# Patient Record
Sex: Male | Born: 1935 | Race: White | Hispanic: No | Marital: Married | Smoking: Never smoker
Health system: Southern US, Community
[De-identification: ages and names within clinical notes are randomized; demographics above are authoritative.]

## PROBLEM LIST (undated history)

## (undated) DIAGNOSIS — M51369 Other intervertebral disc degeneration, lumbar region without mention of lumbar back pain or lower extremity pain: Secondary | ICD-10-CM

## (undated) DIAGNOSIS — I10 Essential (primary) hypertension: Secondary | ICD-10-CM

## (undated) DIAGNOSIS — I82819 Embolism and thrombosis of superficial veins of unspecified lower extremities: Secondary | ICD-10-CM

## (undated) DIAGNOSIS — N183 Chronic kidney disease, stage 3 unspecified: Secondary | ICD-10-CM

## (undated) DIAGNOSIS — R911 Solitary pulmonary nodule: Secondary | ICD-10-CM

## (undated) DIAGNOSIS — E785 Hyperlipidemia, unspecified: Secondary | ICD-10-CM

## (undated) DIAGNOSIS — I701 Atherosclerosis of renal artery: Secondary | ICD-10-CM

## (undated) DIAGNOSIS — C61 Malignant neoplasm of prostate: Secondary | ICD-10-CM

## (undated) DIAGNOSIS — M1A00X0 Idiopathic chronic gout, unspecified site, without tophus (tophi): Secondary | ICD-10-CM

## (undated) DIAGNOSIS — T7840XA Allergy, unspecified, initial encounter: Secondary | ICD-10-CM

## (undated) DIAGNOSIS — Z9289 Personal history of other medical treatment: Secondary | ICD-10-CM

## (undated) DIAGNOSIS — M1A9XX0 Chronic gout, unspecified, without tophus (tophi): Secondary | ICD-10-CM

## (undated) DIAGNOSIS — M5136 Other intervertebral disc degeneration, lumbar region: Secondary | ICD-10-CM

## (undated) DIAGNOSIS — I350 Nonrheumatic aortic (valve) stenosis: Secondary | ICD-10-CM

## (undated) DIAGNOSIS — D649 Anemia, unspecified: Secondary | ICD-10-CM

## (undated) DIAGNOSIS — R001 Bradycardia, unspecified: Secondary | ICD-10-CM

## (undated) DIAGNOSIS — I219 Acute myocardial infarction, unspecified: Secondary | ICD-10-CM

## (undated) DIAGNOSIS — M2550 Pain in unspecified joint: Secondary | ICD-10-CM

## (undated) DIAGNOSIS — I2510 Atherosclerotic heart disease of native coronary artery without angina pectoris: Secondary | ICD-10-CM

## (undated) DIAGNOSIS — K219 Gastro-esophageal reflux disease without esophagitis: Secondary | ICD-10-CM

## (undated) DIAGNOSIS — I4891 Unspecified atrial fibrillation: Secondary | ICD-10-CM

## (undated) DIAGNOSIS — N390 Urinary tract infection, site not specified: Secondary | ICD-10-CM

## (undated) DIAGNOSIS — S199XXA Unspecified injury of neck, initial encounter: Secondary | ICD-10-CM

## (undated) HISTORY — DX: Hyperlipidemia, unspecified: E78.5

## (undated) HISTORY — DX: Malignant neoplasm of prostate: C61

## (undated) HISTORY — PX: OTHER SURGICAL HISTORY: SHX169

## (undated) HISTORY — DX: Allergy, unspecified, initial encounter: T78.40XA

## (undated) HISTORY — DX: Acute myocardial infarction, unspecified: I21.9

## (undated) HISTORY — DX: Other intervertebral disc degeneration, lumbar region: M51.36

## (undated) HISTORY — DX: Gastro-esophageal reflux disease without esophagitis: K21.9

## (undated) HISTORY — DX: Chronic kidney disease, stage 3 unspecified: N18.30

## (undated) HISTORY — DX: Pain in unspecified joint: M25.50

## (undated) HISTORY — PX: PROSTATECTOMY: SHX69

## (undated) HISTORY — DX: Personal history of other medical treatment: Z92.89

## (undated) HISTORY — DX: Essential (primary) hypertension: I10

## (undated) HISTORY — DX: Solitary pulmonary nodule: R91.1

## (undated) HISTORY — DX: Atherosclerosis of renal artery: I70.1

## (undated) HISTORY — DX: Chronic gout, unspecified, without tophus (tophi): M1A.9XX0

## (undated) HISTORY — PX: CATARACT EXTRACTION W/ INTRAOCULAR LENS  IMPLANT, BILATERAL: SHX1307

## (undated) HISTORY — DX: Other intervertebral disc degeneration, lumbar region without mention of lumbar back pain or lower extremity pain: M51.369

## (undated) HISTORY — DX: Atherosclerotic heart disease of native coronary artery without angina pectoris: I25.10

## (undated) HISTORY — DX: Unspecified injury of neck, initial encounter: S19.9XXA

## (undated) HISTORY — DX: Idiopathic chronic gout, unspecified site, without tophus (tophi): M1A.00X0

## (undated) HISTORY — DX: Urinary tract infection, site not specified: N39.0

## (undated) HISTORY — PX: CARDIAC CATHETERIZATION: SHX172

---

## 1994-07-17 HISTORY — PX: OTHER SURGICAL HISTORY: SHX169

## 1997-11-03 HISTORY — PX: FLEXIBLE SIGMOIDOSCOPY: SHX1649

## 1998-04-06 ENCOUNTER — Other Ambulatory Visit: Admission: RE | Admit: 1998-04-06 | Discharge: 1998-04-06 | Payer: Self-pay | Admitting: Urology

## 1998-04-20 ENCOUNTER — Encounter: Payer: Self-pay | Admitting: Urology

## 1998-04-20 ENCOUNTER — Ambulatory Visit (HOSPITAL_COMMUNITY): Admission: RE | Admit: 1998-04-20 | Discharge: 1998-04-20 | Payer: Self-pay | Admitting: Urology

## 1998-04-22 ENCOUNTER — Encounter: Payer: Self-pay | Admitting: Urology

## 1998-04-27 ENCOUNTER — Encounter: Payer: Self-pay | Admitting: Urology

## 1998-05-03 ENCOUNTER — Inpatient Hospital Stay (HOSPITAL_COMMUNITY): Admission: RE | Admit: 1998-05-03 | Discharge: 1998-05-07 | Payer: Self-pay | Admitting: Urology

## 1998-05-26 ENCOUNTER — Emergency Department (HOSPITAL_COMMUNITY): Admission: EM | Admit: 1998-05-26 | Discharge: 1998-05-26 | Payer: Self-pay | Admitting: Emergency Medicine

## 1999-02-09 ENCOUNTER — Encounter: Payer: Self-pay | Admitting: Orthopedic Surgery

## 1999-02-09 ENCOUNTER — Encounter: Admission: RE | Admit: 1999-02-09 | Discharge: 1999-02-09 | Payer: Self-pay | Admitting: Orthopedic Surgery

## 1999-07-05 ENCOUNTER — Inpatient Hospital Stay (HOSPITAL_COMMUNITY): Admission: EM | Admit: 1999-07-05 | Discharge: 1999-07-08 | Payer: Self-pay | Admitting: Emergency Medicine

## 1999-07-05 ENCOUNTER — Encounter: Payer: Self-pay | Admitting: Emergency Medicine

## 1999-07-07 HISTORY — PX: OTHER SURGICAL HISTORY: SHX169

## 1999-07-20 ENCOUNTER — Encounter: Payer: Self-pay | Admitting: Internal Medicine

## 1999-07-20 ENCOUNTER — Ambulatory Visit (HOSPITAL_COMMUNITY): Admission: RE | Admit: 1999-07-20 | Discharge: 1999-07-20 | Payer: Self-pay | Admitting: Internal Medicine

## 1999-08-01 ENCOUNTER — Ambulatory Visit: Admission: RE | Admit: 1999-08-01 | Discharge: 1999-08-01 | Payer: Self-pay | Admitting: Internal Medicine

## 1999-09-18 ENCOUNTER — Encounter (HOSPITAL_COMMUNITY): Admission: RE | Admit: 1999-09-18 | Discharge: 1999-12-17 | Payer: Self-pay | Admitting: Cardiology

## 1999-11-20 ENCOUNTER — Inpatient Hospital Stay (HOSPITAL_COMMUNITY): Admission: EM | Admit: 1999-11-20 | Discharge: 1999-11-21 | Payer: Self-pay | Admitting: Emergency Medicine

## 1999-11-20 ENCOUNTER — Encounter: Payer: Self-pay | Admitting: Emergency Medicine

## 1999-11-21 ENCOUNTER — Encounter: Payer: Self-pay | Admitting: Cardiology

## 1999-12-18 ENCOUNTER — Encounter (HOSPITAL_COMMUNITY): Admission: RE | Admit: 1999-12-18 | Discharge: 2000-03-17 | Payer: Self-pay | Admitting: Cardiology

## 2000-03-18 ENCOUNTER — Encounter (HOSPITAL_COMMUNITY): Admission: RE | Admit: 2000-03-18 | Discharge: 2000-06-16 | Payer: Self-pay | Admitting: Cardiology

## 2000-06-17 ENCOUNTER — Encounter (HOSPITAL_COMMUNITY): Admission: RE | Admit: 2000-06-17 | Discharge: 2000-09-15 | Payer: Self-pay | Admitting: Cardiology

## 2000-09-03 ENCOUNTER — Encounter: Payer: Self-pay | Admitting: Neurosurgery

## 2000-09-03 ENCOUNTER — Encounter: Admission: RE | Admit: 2000-09-03 | Discharge: 2000-09-03 | Payer: Self-pay | Admitting: Neurosurgery

## 2000-09-16 ENCOUNTER — Encounter (HOSPITAL_COMMUNITY): Admission: RE | Admit: 2000-09-16 | Discharge: 2000-12-15 | Payer: Self-pay | Admitting: Cardiology

## 2001-01-08 ENCOUNTER — Encounter (HOSPITAL_COMMUNITY): Admission: RE | Admit: 2001-01-08 | Discharge: 2001-04-08 | Payer: Self-pay | Admitting: Cardiology

## 2001-04-09 ENCOUNTER — Encounter (HOSPITAL_COMMUNITY): Admission: RE | Admit: 2001-04-09 | Discharge: 2001-07-08 | Payer: Self-pay | Admitting: Cardiology

## 2001-06-13 ENCOUNTER — Encounter: Payer: Self-pay | Admitting: Internal Medicine

## 2001-06-13 ENCOUNTER — Ambulatory Visit (HOSPITAL_BASED_OUTPATIENT_CLINIC_OR_DEPARTMENT_OTHER): Admission: RE | Admit: 2001-06-13 | Discharge: 2001-06-13 | Payer: Self-pay | Admitting: Internal Medicine

## 2001-07-09 ENCOUNTER — Encounter (HOSPITAL_COMMUNITY): Admission: RE | Admit: 2001-07-09 | Discharge: 2001-09-15 | Payer: Self-pay | Admitting: Cardiology

## 2003-10-01 HISTORY — PX: OTHER SURGICAL HISTORY: SHX169

## 2003-10-26 ENCOUNTER — Ambulatory Visit (HOSPITAL_COMMUNITY): Admission: RE | Admit: 2003-10-26 | Discharge: 2003-10-26 | Payer: Self-pay | Admitting: Cardiology

## 2003-11-12 ENCOUNTER — Ambulatory Visit: Payer: Self-pay | Admitting: Cardiology

## 2003-11-24 ENCOUNTER — Ambulatory Visit: Payer: Self-pay | Admitting: Cardiology

## 2003-11-24 HISTORY — PX: OTHER SURGICAL HISTORY: SHX169

## 2003-11-25 ENCOUNTER — Ambulatory Visit: Payer: Self-pay | Admitting: Cardiology

## 2003-11-26 ENCOUNTER — Ambulatory Visit (HOSPITAL_COMMUNITY): Admission: RE | Admit: 2003-11-26 | Discharge: 2003-11-26 | Payer: Self-pay | Admitting: Cardiology

## 2003-12-07 ENCOUNTER — Ambulatory Visit: Payer: Self-pay | Admitting: Cardiology

## 2003-12-14 ENCOUNTER — Ambulatory Visit: Payer: Self-pay | Admitting: Internal Medicine

## 2003-12-22 ENCOUNTER — Ambulatory Visit: Payer: Self-pay | Admitting: Internal Medicine

## 2004-02-14 ENCOUNTER — Ambulatory Visit: Payer: Self-pay | Admitting: Internal Medicine

## 2004-03-28 ENCOUNTER — Ambulatory Visit: Payer: Self-pay | Admitting: Cardiology

## 2004-04-04 ENCOUNTER — Ambulatory Visit: Payer: Self-pay | Admitting: Cardiology

## 2004-04-10 ENCOUNTER — Ambulatory Visit: Payer: Self-pay | Admitting: Internal Medicine

## 2004-06-08 ENCOUNTER — Ambulatory Visit: Payer: Self-pay | Admitting: Internal Medicine

## 2004-07-04 ENCOUNTER — Ambulatory Visit: Payer: Self-pay | Admitting: Internal Medicine

## 2004-07-05 ENCOUNTER — Ambulatory Visit (HOSPITAL_COMMUNITY): Admission: RE | Admit: 2004-07-05 | Discharge: 2004-07-05 | Payer: Self-pay | Admitting: Internal Medicine

## 2004-09-08 ENCOUNTER — Ambulatory Visit: Payer: Self-pay | Admitting: Cardiology

## 2004-11-03 ENCOUNTER — Ambulatory Visit: Payer: Self-pay | Admitting: Internal Medicine

## 2004-12-14 ENCOUNTER — Ambulatory Visit: Payer: Self-pay | Admitting: Internal Medicine

## 2005-01-30 ENCOUNTER — Ambulatory Visit: Payer: Self-pay | Admitting: Internal Medicine

## 2005-03-12 ENCOUNTER — Ambulatory Visit: Payer: Self-pay | Admitting: Internal Medicine

## 2005-04-20 ENCOUNTER — Ambulatory Visit: Payer: Self-pay | Admitting: Cardiology

## 2005-05-04 ENCOUNTER — Ambulatory Visit: Payer: Self-pay

## 2005-05-04 HISTORY — PX: OTHER SURGICAL HISTORY: SHX169

## 2005-06-13 ENCOUNTER — Ambulatory Visit: Payer: Self-pay | Admitting: Internal Medicine

## 2005-07-16 ENCOUNTER — Ambulatory Visit: Payer: Self-pay | Admitting: Internal Medicine

## 2005-09-08 ENCOUNTER — Ambulatory Visit: Payer: Self-pay | Admitting: Family Medicine

## 2005-09-13 ENCOUNTER — Ambulatory Visit: Payer: Self-pay | Admitting: Internal Medicine

## 2005-10-12 ENCOUNTER — Ambulatory Visit: Payer: Self-pay | Admitting: Internal Medicine

## 2005-11-20 ENCOUNTER — Ambulatory Visit: Payer: Self-pay | Admitting: Cardiology

## 2006-01-16 ENCOUNTER — Ambulatory Visit: Payer: Self-pay | Admitting: Internal Medicine

## 2006-01-16 LAB — CONVERTED CEMR LAB
ALT: 20 units/L (ref 0–40)
AST: 24 units/L (ref 0–37)
LDL Cholesterol: 68 mg/dL (ref 0–99)
PSA: 0.4 ng/mL (ref 0.10–4.00)
VLDL: 21 mg/dL (ref 0–40)

## 2006-01-18 ENCOUNTER — Ambulatory Visit: Payer: Self-pay | Admitting: Internal Medicine

## 2006-02-21 ENCOUNTER — Ambulatory Visit: Payer: Self-pay | Admitting: Internal Medicine

## 2006-03-27 ENCOUNTER — Ambulatory Visit: Payer: Self-pay | Admitting: Internal Medicine

## 2006-07-17 ENCOUNTER — Ambulatory Visit: Payer: Self-pay | Admitting: Internal Medicine

## 2006-07-17 LAB — CONVERTED CEMR LAB
ALT: 13 units/L (ref 0–53)
AST: 18 units/L (ref 0–37)
PSA: 0.39 ng/mL (ref 0.10–4.00)

## 2006-07-20 ENCOUNTER — Encounter: Payer: Self-pay | Admitting: Internal Medicine

## 2006-07-20 DIAGNOSIS — N259 Disorder resulting from impaired renal tubular function, unspecified: Secondary | ICD-10-CM | POA: Insufficient documentation

## 2006-07-20 DIAGNOSIS — C61 Malignant neoplasm of prostate: Secondary | ICD-10-CM | POA: Insufficient documentation

## 2006-07-20 DIAGNOSIS — I2510 Atherosclerotic heart disease of native coronary artery without angina pectoris: Secondary | ICD-10-CM | POA: Insufficient documentation

## 2006-07-20 DIAGNOSIS — I10 Essential (primary) hypertension: Secondary | ICD-10-CM

## 2006-07-20 HISTORY — DX: Essential (primary) hypertension: I10

## 2006-07-23 ENCOUNTER — Ambulatory Visit: Payer: Self-pay | Admitting: Cardiology

## 2006-07-29 ENCOUNTER — Ambulatory Visit: Payer: Self-pay | Admitting: Internal Medicine

## 2006-08-02 ENCOUNTER — Ambulatory Visit: Payer: Self-pay | Admitting: Internal Medicine

## 2006-08-02 LAB — CONVERTED CEMR LAB: Uric Acid, Serum: 5.8 mg/dL (ref 2.4–7.0)

## 2006-08-16 ENCOUNTER — Ambulatory Visit: Payer: Self-pay | Admitting: Internal Medicine

## 2006-08-26 ENCOUNTER — Ambulatory Visit: Payer: Self-pay | Admitting: Internal Medicine

## 2006-09-16 ENCOUNTER — Encounter: Payer: Self-pay | Admitting: Internal Medicine

## 2006-09-16 ENCOUNTER — Ambulatory Visit: Payer: Self-pay | Admitting: Internal Medicine

## 2006-10-04 ENCOUNTER — Ambulatory Visit: Payer: Self-pay | Admitting: Internal Medicine

## 2007-01-06 ENCOUNTER — Telehealth: Payer: Self-pay | Admitting: Internal Medicine

## 2007-01-17 ENCOUNTER — Ambulatory Visit: Payer: Self-pay | Admitting: Internal Medicine

## 2007-01-17 ENCOUNTER — Telehealth: Payer: Self-pay | Admitting: Internal Medicine

## 2007-01-17 DIAGNOSIS — E785 Hyperlipidemia, unspecified: Secondary | ICD-10-CM

## 2007-01-17 LAB — CONVERTED CEMR LAB
AST: 23 units/L (ref 0–37)
Cholesterol: 141 mg/dL (ref 0–200)
LDL Cholesterol: 92 mg/dL (ref 0–99)
PSA: 0.38 ng/mL (ref 0.10–4.00)

## 2007-01-19 ENCOUNTER — Encounter: Payer: Self-pay | Admitting: Internal Medicine

## 2007-01-22 ENCOUNTER — Ambulatory Visit: Payer: Self-pay | Admitting: Cardiology

## 2007-03-28 ENCOUNTER — Ambulatory Visit: Payer: Self-pay | Admitting: Internal Medicine

## 2007-04-03 ENCOUNTER — Ambulatory Visit: Payer: Self-pay | Admitting: Gastroenterology

## 2007-04-12 HISTORY — PX: COLONOSCOPY: SHX174

## 2007-04-15 ENCOUNTER — Ambulatory Visit: Payer: Self-pay | Admitting: Gastroenterology

## 2007-04-15 ENCOUNTER — Encounter: Payer: Self-pay | Admitting: Internal Medicine

## 2007-04-15 ENCOUNTER — Encounter: Payer: Self-pay | Admitting: Gastroenterology

## 2007-04-28 ENCOUNTER — Encounter: Payer: Self-pay | Admitting: Internal Medicine

## 2007-04-29 ENCOUNTER — Telehealth (INDEPENDENT_AMBULATORY_CARE_PROVIDER_SITE_OTHER): Payer: Self-pay | Admitting: *Deleted

## 2007-05-01 ENCOUNTER — Ambulatory Visit: Payer: Self-pay | Admitting: Internal Medicine

## 2007-06-18 ENCOUNTER — Encounter: Payer: Self-pay | Admitting: Internal Medicine

## 2007-06-20 ENCOUNTER — Telehealth: Payer: Self-pay | Admitting: Internal Medicine

## 2007-06-24 ENCOUNTER — Encounter: Payer: Self-pay | Admitting: Internal Medicine

## 2007-07-01 ENCOUNTER — Telehealth: Payer: Self-pay | Admitting: Internal Medicine

## 2007-07-02 ENCOUNTER — Ambulatory Visit: Payer: Self-pay | Admitting: Internal Medicine

## 2007-07-04 ENCOUNTER — Encounter (INDEPENDENT_AMBULATORY_CARE_PROVIDER_SITE_OTHER): Payer: Self-pay | Admitting: *Deleted

## 2007-07-07 ENCOUNTER — Ambulatory Visit: Payer: Self-pay

## 2007-07-07 ENCOUNTER — Encounter: Payer: Self-pay | Admitting: Internal Medicine

## 2007-07-14 ENCOUNTER — Ambulatory Visit: Payer: Self-pay | Admitting: Cardiology

## 2007-07-18 ENCOUNTER — Ambulatory Visit: Payer: Self-pay | Admitting: Internal Medicine

## 2007-07-18 ENCOUNTER — Telehealth: Payer: Self-pay | Admitting: Internal Medicine

## 2007-07-18 LAB — CONVERTED CEMR LAB
ALT: 21 units/L (ref 0–53)
Alkaline Phosphatase: 79 units/L (ref 39–117)
Bilirubin, Direct: 0.1 mg/dL (ref 0.0–0.3)
CO2: 25 meq/L (ref 19–32)
Calcium: 9.6 mg/dL (ref 8.4–10.5)
Glucose, Bld: 89 mg/dL (ref 70–99)
HDL: 29.2 mg/dL — ABNORMAL LOW (ref >39.0)
Sodium: 140 meq/L (ref 135–145)
Total Protein: 7.3 g/dL (ref 6.0–8.3)

## 2007-07-19 ENCOUNTER — Encounter: Payer: Self-pay | Admitting: Internal Medicine

## 2007-08-08 ENCOUNTER — Ambulatory Visit: Payer: Self-pay | Admitting: Cardiology

## 2007-08-22 ENCOUNTER — Telehealth: Payer: Self-pay | Admitting: Family Medicine

## 2007-08-22 ENCOUNTER — Inpatient Hospital Stay (HOSPITAL_COMMUNITY): Admission: EM | Admit: 2007-08-22 | Discharge: 2007-08-25 | Payer: Self-pay | Admitting: Emergency Medicine

## 2007-08-22 ENCOUNTER — Ambulatory Visit: Payer: Self-pay | Admitting: *Deleted

## 2007-08-22 ENCOUNTER — Encounter: Payer: Self-pay | Admitting: Cardiology

## 2007-08-24 ENCOUNTER — Encounter: Payer: Self-pay | Admitting: Cardiology

## 2007-08-28 ENCOUNTER — Ambulatory Visit: Payer: Self-pay | Admitting: Internal Medicine

## 2007-08-29 DIAGNOSIS — R42 Dizziness and giddiness: Secondary | ICD-10-CM | POA: Insufficient documentation

## 2007-08-29 DIAGNOSIS — R1013 Epigastric pain: Secondary | ICD-10-CM

## 2007-08-29 DIAGNOSIS — K3189 Other diseases of stomach and duodenum: Secondary | ICD-10-CM

## 2007-08-31 ENCOUNTER — Encounter: Payer: Self-pay | Admitting: Internal Medicine

## 2007-10-02 ENCOUNTER — Ambulatory Visit: Payer: Self-pay | Admitting: Cardiology

## 2007-10-07 ENCOUNTER — Encounter: Payer: Self-pay | Admitting: Internal Medicine

## 2007-10-10 ENCOUNTER — Encounter: Payer: Self-pay | Admitting: Internal Medicine

## 2007-10-13 ENCOUNTER — Ambulatory Visit: Payer: Self-pay | Admitting: Cardiology

## 2007-10-13 LAB — CONVERTED CEMR LAB
Basophils Absolute: 0 10*3/uL (ref 0.0–0.1)
Calcium: 9 mg/dL (ref 8.4–10.5)
Eosinophils Absolute: 0.1 10*3/uL (ref 0.0–0.7)
GFR calc Af Amer: 39 mL/min
HCT: 39.9 % (ref 39.0–52.0)
Hemoglobin: 13.4 g/dL (ref 13.0–17.0)
MCHC: 33.6 g/dL (ref 30.0–36.0)
MCV: 79.6 fL (ref 78.0–100.0)
Monocytes Absolute: 0.5 10*3/uL (ref 0.1–1.0)
Neutro Abs: 2.6 10*3/uL (ref 1.4–7.7)
Prothrombin Time: 13 s (ref 10.9–13.3)
RDW: 14.4 % (ref 11.5–14.6)
Sodium: 141 meq/L (ref 135–145)
aPTT: 31.2 s — ABNORMAL HIGH (ref 21.7–29.8)

## 2007-10-15 ENCOUNTER — Ambulatory Visit (HOSPITAL_COMMUNITY): Admission: RE | Admit: 2007-10-15 | Discharge: 2007-10-15 | Payer: Self-pay | Admitting: Cardiology

## 2007-10-17 ENCOUNTER — Ambulatory Visit: Payer: Self-pay | Admitting: Cardiology

## 2007-10-17 ENCOUNTER — Ambulatory Visit (HOSPITAL_COMMUNITY): Admission: RE | Admit: 2007-10-17 | Discharge: 2007-10-17 | Payer: Self-pay | Admitting: Cardiology

## 2007-10-23 ENCOUNTER — Encounter: Payer: Self-pay | Admitting: Internal Medicine

## 2007-11-05 ENCOUNTER — Ambulatory Visit: Payer: Self-pay | Admitting: Cardiology

## 2007-11-05 LAB — CONVERTED CEMR LAB
Calcium: 9.3 mg/dL (ref 8.4–10.5)
Creatinine, Ser: 1.9 mg/dL — ABNORMAL HIGH (ref 0.4–1.5)
GFR calc Af Amer: 44 mL/min
Sodium: 141 meq/L (ref 135–145)

## 2007-11-13 ENCOUNTER — Ambulatory Visit: Payer: Self-pay | Admitting: Cardiology

## 2007-11-17 ENCOUNTER — Encounter: Payer: Self-pay | Admitting: Internal Medicine

## 2007-12-02 ENCOUNTER — Encounter: Payer: Self-pay | Admitting: Internal Medicine

## 2007-12-18 ENCOUNTER — Telehealth: Payer: Self-pay | Admitting: Internal Medicine

## 2007-12-18 ENCOUNTER — Ambulatory Visit: Payer: Self-pay | Admitting: Internal Medicine

## 2007-12-20 LAB — CONVERTED CEMR LAB
Cholesterol: 117 mg/dL (ref 0–200)
Total CHOL/HDL Ratio: 5.8
Triglycerides: 83 mg/dL (ref 0–149)

## 2007-12-22 ENCOUNTER — Ambulatory Visit: Payer: Self-pay | Admitting: Internal Medicine

## 2008-01-13 ENCOUNTER — Ambulatory Visit: Payer: Self-pay | Admitting: Internal Medicine

## 2008-01-13 DIAGNOSIS — R05 Cough: Secondary | ICD-10-CM

## 2008-01-13 DIAGNOSIS — R053 Chronic cough: Secondary | ICD-10-CM | POA: Insufficient documentation

## 2008-01-16 ENCOUNTER — Telehealth: Payer: Self-pay | Admitting: Internal Medicine

## 2008-01-27 ENCOUNTER — Encounter: Payer: Self-pay | Admitting: Internal Medicine

## 2008-02-06 ENCOUNTER — Telehealth: Payer: Self-pay | Admitting: Internal Medicine

## 2008-04-02 ENCOUNTER — Encounter: Payer: Self-pay | Admitting: Internal Medicine

## 2008-04-16 ENCOUNTER — Encounter: Payer: Self-pay | Admitting: Internal Medicine

## 2008-04-22 ENCOUNTER — Encounter: Payer: Self-pay | Admitting: Internal Medicine

## 2008-04-27 ENCOUNTER — Encounter: Payer: Self-pay | Admitting: Internal Medicine

## 2008-04-27 ENCOUNTER — Telehealth: Payer: Self-pay | Admitting: Internal Medicine

## 2008-04-28 ENCOUNTER — Encounter (INDEPENDENT_AMBULATORY_CARE_PROVIDER_SITE_OTHER): Payer: Self-pay | Admitting: *Deleted

## 2008-05-18 ENCOUNTER — Encounter: Payer: Self-pay | Admitting: Internal Medicine

## 2008-05-20 ENCOUNTER — Encounter: Payer: Self-pay | Admitting: Internal Medicine

## 2008-05-28 ENCOUNTER — Ambulatory Visit: Payer: Self-pay | Admitting: Cardiology

## 2008-06-17 ENCOUNTER — Ambulatory Visit: Payer: Self-pay | Admitting: Internal Medicine

## 2008-06-17 LAB — CONVERTED CEMR LAB
HDL: 29.4 mg/dL — ABNORMAL LOW (ref >39.00)
Total CHOL/HDL Ratio: 4
Triglycerides: 102 mg/dL (ref 0.0–149.0)
VLDL: 20.4 mg/dL (ref 0.0–40.0)

## 2008-06-26 ENCOUNTER — Encounter: Payer: Self-pay | Admitting: Internal Medicine

## 2008-07-05 ENCOUNTER — Telehealth: Payer: Self-pay | Admitting: Internal Medicine

## 2008-07-06 ENCOUNTER — Telehealth: Payer: Self-pay | Admitting: Internal Medicine

## 2008-07-09 ENCOUNTER — Ambulatory Visit: Payer: Self-pay | Admitting: Internal Medicine

## 2008-07-13 ENCOUNTER — Telehealth: Payer: Self-pay | Admitting: Internal Medicine

## 2008-07-19 ENCOUNTER — Ambulatory Visit: Payer: Self-pay | Admitting: Cardiology

## 2008-08-05 ENCOUNTER — Ambulatory Visit: Payer: Self-pay | Admitting: Cardiology

## 2008-08-05 DIAGNOSIS — I951 Orthostatic hypotension: Secondary | ICD-10-CM

## 2008-08-05 DIAGNOSIS — R0602 Shortness of breath: Secondary | ICD-10-CM

## 2008-08-06 LAB — CONVERTED CEMR LAB
AST: 30 units/L (ref 0–37)
Alkaline Phosphatase: 78 units/L (ref 39–117)
Basophils Absolute: 0 10*3/uL (ref 0.0–0.1)
Bilirubin, Direct: 0.1 mg/dL (ref 0.0–0.3)
Calcium: 9.4 mg/dL (ref 8.4–10.5)
Eosinophils Absolute: 0.1 10*3/uL (ref 0.0–0.7)
Eosinophils Relative: 1.6 % (ref 0.0–5.0)
GFR calc non Af Amer: 29.22 mL/min (ref >60)
Glucose, Bld: 88 mg/dL (ref 70–99)
Lymphs Abs: 1.3 10*3/uL (ref 0.7–4.0)
MCV: 80.2 fL (ref 78.0–100.0)
Monocytes Absolute: 0.6 10*3/uL (ref 0.1–1.0)
Neutrophils Relative %: 65.3 % (ref 43.0–77.0)
Platelets: 276 10*3/uL (ref 150.0–400.0)
Potassium: 4.6 meq/L (ref 3.5–5.1)
RDW: 14.7 % — ABNORMAL HIGH (ref 11.5–14.6)
Sodium: 139 meq/L (ref 135–145)
Total Bilirubin: 1.4 mg/dL — ABNORMAL HIGH (ref 0.3–1.2)
WBC: 5.9 10*3/uL (ref 4.5–10.5)

## 2008-08-09 ENCOUNTER — Telehealth (INDEPENDENT_AMBULATORY_CARE_PROVIDER_SITE_OTHER): Payer: Self-pay | Admitting: *Deleted

## 2008-08-10 ENCOUNTER — Ambulatory Visit: Payer: Self-pay

## 2008-08-10 ENCOUNTER — Encounter: Payer: Self-pay | Admitting: Cardiovascular Disease

## 2008-08-11 ENCOUNTER — Ambulatory Visit (HOSPITAL_COMMUNITY): Admission: RE | Admit: 2008-08-11 | Discharge: 2008-08-11 | Payer: Self-pay | Admitting: Cardiology

## 2008-08-11 ENCOUNTER — Encounter: Payer: Self-pay | Admitting: Internal Medicine

## 2008-08-12 ENCOUNTER — Encounter: Payer: Self-pay | Admitting: Internal Medicine

## 2008-09-02 ENCOUNTER — Encounter: Admission: RE | Admit: 2008-09-02 | Discharge: 2008-09-02 | Payer: Self-pay | Admitting: Neurology

## 2008-09-02 ENCOUNTER — Encounter: Payer: Self-pay | Admitting: Internal Medicine

## 2008-09-02 ENCOUNTER — Encounter: Payer: Self-pay | Admitting: Cardiology

## 2008-09-03 ENCOUNTER — Encounter: Payer: Self-pay | Admitting: Internal Medicine

## 2008-09-07 ENCOUNTER — Encounter: Payer: Self-pay | Admitting: Cardiology

## 2008-09-07 ENCOUNTER — Encounter: Payer: Self-pay | Admitting: Internal Medicine

## 2008-09-21 ENCOUNTER — Ambulatory Visit: Payer: Self-pay | Admitting: Cardiology

## 2008-09-21 DIAGNOSIS — M5030 Other cervical disc degeneration, unspecified cervical region: Secondary | ICD-10-CM | POA: Insufficient documentation

## 2008-09-21 DIAGNOSIS — M5020 Other cervical disc displacement, unspecified cervical region: Secondary | ICD-10-CM

## 2008-09-21 HISTORY — DX: Other cervical disc degeneration, unspecified cervical region: M50.30

## 2008-11-03 ENCOUNTER — Telehealth: Payer: Self-pay | Admitting: Internal Medicine

## 2008-12-01 ENCOUNTER — Telehealth: Payer: Self-pay | Admitting: Internal Medicine

## 2008-12-09 ENCOUNTER — Encounter: Payer: Self-pay | Admitting: Internal Medicine

## 2008-12-13 ENCOUNTER — Ambulatory Visit: Payer: Self-pay | Admitting: Internal Medicine

## 2008-12-13 LAB — CONVERTED CEMR LAB
HDL: 32.1 mg/dL — ABNORMAL LOW (ref >39.00)
LDL Cholesterol: 82 mg/dL (ref 0–99)
Total CHOL/HDL Ratio: 4
Triglycerides: 82 mg/dL (ref 0.0–149.0)
VLDL: 16.4 mg/dL (ref 0.0–40.0)

## 2008-12-23 ENCOUNTER — Encounter: Payer: Self-pay | Admitting: Internal Medicine

## 2009-01-12 ENCOUNTER — Telehealth (INDEPENDENT_AMBULATORY_CARE_PROVIDER_SITE_OTHER): Payer: Self-pay | Admitting: *Deleted

## 2009-01-21 ENCOUNTER — Ambulatory Visit: Payer: Self-pay | Admitting: Internal Medicine

## 2009-02-16 ENCOUNTER — Encounter: Payer: Self-pay | Admitting: Internal Medicine

## 2009-03-11 ENCOUNTER — Encounter: Payer: Self-pay | Admitting: Internal Medicine

## 2009-03-15 ENCOUNTER — Encounter: Payer: Self-pay | Admitting: Cardiology

## 2009-03-15 ENCOUNTER — Encounter: Payer: Self-pay | Admitting: Internal Medicine

## 2009-03-21 ENCOUNTER — Ambulatory Visit: Payer: Self-pay | Admitting: Cardiology

## 2009-03-21 DIAGNOSIS — G629 Polyneuropathy, unspecified: Secondary | ICD-10-CM

## 2009-03-21 DIAGNOSIS — G609 Hereditary and idiopathic neuropathy, unspecified: Secondary | ICD-10-CM | POA: Insufficient documentation

## 2009-03-21 HISTORY — DX: Polyneuropathy, unspecified: G62.9

## 2009-04-11 ENCOUNTER — Ambulatory Visit: Payer: Self-pay | Admitting: Internal Medicine

## 2009-04-11 LAB — CONVERTED CEMR LAB: Vitamin B-12: 141 pg/mL — ABNORMAL LOW (ref 211–911)

## 2009-04-14 ENCOUNTER — Encounter: Payer: Self-pay | Admitting: Internal Medicine

## 2009-04-22 ENCOUNTER — Telehealth: Payer: Self-pay | Admitting: Internal Medicine

## 2009-04-26 ENCOUNTER — Ambulatory Visit: Payer: Self-pay | Admitting: Internal Medicine

## 2009-05-27 ENCOUNTER — Ambulatory Visit: Payer: Self-pay | Admitting: Internal Medicine

## 2009-06-14 ENCOUNTER — Ambulatory Visit: Payer: Self-pay | Admitting: Internal Medicine

## 2009-06-14 LAB — CONVERTED CEMR LAB
LDL Cholesterol: 86 mg/dL (ref 0–99)
Total CHOL/HDL Ratio: 4
Triglycerides: 80 mg/dL (ref 0.0–149.0)

## 2009-06-23 ENCOUNTER — Ambulatory Visit: Payer: Self-pay | Admitting: Internal Medicine

## 2009-07-21 ENCOUNTER — Ambulatory Visit: Payer: Self-pay | Admitting: Internal Medicine

## 2009-08-23 ENCOUNTER — Ambulatory Visit: Payer: Self-pay | Admitting: Internal Medicine

## 2009-08-23 DIAGNOSIS — E538 Deficiency of other specified B group vitamins: Secondary | ICD-10-CM

## 2009-08-23 HISTORY — DX: Deficiency of other specified B group vitamins: E53.8

## 2009-09-15 ENCOUNTER — Encounter: Payer: Self-pay | Admitting: Internal Medicine

## 2009-09-16 ENCOUNTER — Telehealth: Payer: Self-pay | Admitting: Internal Medicine

## 2009-09-21 ENCOUNTER — Ambulatory Visit: Payer: Self-pay | Admitting: Cardiology

## 2009-09-22 ENCOUNTER — Ambulatory Visit: Payer: Self-pay | Admitting: Internal Medicine

## 2009-10-04 ENCOUNTER — Telehealth: Payer: Self-pay | Admitting: Internal Medicine

## 2009-10-06 ENCOUNTER — Telehealth: Payer: Self-pay | Admitting: Internal Medicine

## 2009-10-13 ENCOUNTER — Encounter: Payer: Self-pay | Admitting: Internal Medicine

## 2009-10-14 ENCOUNTER — Ambulatory Visit: Payer: Self-pay | Admitting: Internal Medicine

## 2009-10-14 DIAGNOSIS — M25519 Pain in unspecified shoulder: Secondary | ICD-10-CM

## 2009-10-18 ENCOUNTER — Telehealth: Payer: Self-pay | Admitting: Internal Medicine

## 2009-10-24 ENCOUNTER — Ambulatory Visit: Payer: Self-pay | Admitting: Internal Medicine

## 2009-10-28 ENCOUNTER — Encounter: Payer: Self-pay | Admitting: Internal Medicine

## 2009-11-09 ENCOUNTER — Ambulatory Visit: Payer: Self-pay | Admitting: Pulmonary Disease

## 2009-11-09 DIAGNOSIS — G4700 Insomnia, unspecified: Secondary | ICD-10-CM | POA: Insufficient documentation

## 2009-11-25 ENCOUNTER — Ambulatory Visit: Payer: Self-pay | Admitting: Internal Medicine

## 2009-11-28 ENCOUNTER — Ambulatory Visit: Payer: Self-pay | Admitting: Internal Medicine

## 2009-11-28 DIAGNOSIS — D179 Benign lipomatous neoplasm, unspecified: Secondary | ICD-10-CM | POA: Insufficient documentation

## 2009-11-28 DIAGNOSIS — H02539 Eyelid retraction unspecified eye, unspecified lid: Secondary | ICD-10-CM

## 2009-12-13 ENCOUNTER — Ambulatory Visit: Payer: Self-pay | Admitting: Internal Medicine

## 2009-12-13 ENCOUNTER — Telehealth: Payer: Self-pay | Admitting: Internal Medicine

## 2009-12-13 LAB — CONVERTED CEMR LAB
AST: 20 units/L (ref 0–37)
Albumin: 4 g/dL (ref 3.5–5.2)
Alkaline Phosphatase: 71 units/L (ref 39–117)
BUN: 30 mg/dL — ABNORMAL HIGH (ref 6–23)
CO2: 24 meq/L (ref 19–32)
Calcium: 9.2 mg/dL (ref 8.4–10.5)
Cholesterol: 145 mg/dL (ref 0–200)
GFR calc non Af Amer: 41 mL/min — ABNORMAL LOW (ref >60.00)
Glucose, Bld: 89 mg/dL (ref 70–99)
HDL: 38.9 mg/dL — ABNORMAL LOW (ref >39.00)
Sodium: 140 meq/L (ref 135–145)
Total Protein: 6.8 g/dL (ref 6.0–8.3)
VLDL: 14.2 mg/dL (ref 0.0–40.0)
Vitamin B-12: 978 pg/mL — ABNORMAL HIGH (ref 211–911)

## 2009-12-22 ENCOUNTER — Ambulatory Visit: Payer: Self-pay | Admitting: Internal Medicine

## 2010-01-10 ENCOUNTER — Ambulatory Visit
Admission: RE | Admit: 2010-01-10 | Discharge: 2010-01-10 | Payer: Self-pay | Source: Home / Self Care | Attending: Internal Medicine | Admitting: Internal Medicine

## 2010-01-16 ENCOUNTER — Ambulatory Visit
Admission: RE | Admit: 2010-01-16 | Discharge: 2010-01-16 | Payer: Self-pay | Source: Home / Self Care | Attending: Internal Medicine | Admitting: Internal Medicine

## 2010-01-18 ENCOUNTER — Telehealth: Payer: Self-pay | Admitting: Internal Medicine

## 2010-01-20 ENCOUNTER — Telehealth: Payer: Self-pay | Admitting: Internal Medicine

## 2010-01-29 ENCOUNTER — Encounter: Payer: Self-pay | Admitting: Internal Medicine

## 2010-02-09 NOTE — Letter (Signed)
Summary: External Correspondence/ [facility]  External Correspondence/ [facility]   Imported By: Bartholomew Boards 03/17/2009 09:53:44  _____________________________________________________________________  External Attachment:    Type:   Image     Comment:   External Document

## 2010-02-09 NOTE — Assessment & Plan Note (Signed)
Summary: COUGH/ CONGESTION /[facility]   Vital Signs:  Patient profile:   77 year old male Height:      72 inches Weight:      178 pounds BMI:     24.23 O2 Sat:      97 % on Room air Temp:     100 degrees F oral Pulse rate:   61 / minute BP sitting:   116 / 62  (left arm) Cuff size:   regular  Vitals Entered By: Charlynne Cousins CMA (January 21, 2009 4:29 PM)  O2 Flow:  Room air CC: pt here with c/o sore throat and cough with production of clear mucous. Pt is also running a fever. onset was 2 days ago/ ab   Primary Care Provider:  [facility]  CC:  pt here with c/o sore throat and cough with production of clear mucous. Pt is also running a fever. onset was 2 days ago/ ab.  History of Present Illness: Patient presents with a 36 hr h/o sore throat. He denies rigors, SOB, cough, myagia. He has had a low grade fever to 100.2.  Current Medications (verified): 1)  Bl Aspirin 325 Mg  Tabs (Aspirin) .... Take 1 By Mouth Qd 2)  Klor-Con 8 Meq  Tbcr (Potassium Chloride) .... Take 1 By Mouth Qd 3)  Folic Acid 1 Mg  Tabs (Folic Acid) .[name].. 1 Once Daily 4)  Zocor 20 Mg  Tabs (Simvastatin) .... Take 1 By Mouth Qd 5)  Toprol Xl 50 Mg  Tb24 (Metoprolol Succinate) .... Take 1/2 Tablet By Mouth Two Times A Day 6)  Nitroglycerin 0.4 Mg  Subl (Nitroglycerin) .[name].. 1 Tab Sublingual Every 15min As Needed For Chest Pain 7)  Gemfibrozil 600 Mg  Tabs (Gemfibrozil) .[name].. 1 Two Times A Day 8)  Colchicine 0.6 Mg Tabs (Colchicine) .[name].. 1 Once Daily Gout Prevention 9)  Vitamin D 1.25 .... Take 1 Tablet By Mouth Once A Month 10)  Amlodipine Besylate 10 Mg Tabs (Amlodipine Besylate) 11)  Clotrimazole-Betamethasone 1-0.05 % Crea (Clotrimazole-Betamethasone) .[name].. 1pply Two Times A Day To Rash As Needed  Allergies (verified): 1)  Codeine 2)  * Ecotrin 3)  Hydrocodone 4)  * Trimox 5)  * Vibromycin 6)  Macrobid 7)  Cipro 8)  * Hydroxytine 9)  * Atarax 10)  Advicor (Niacin-Lovastatin) 11)  Niacin PMH-FH-SH reviewed-no  changes except otherwise noted  Review of Systems       The patient complains of fever.  The patient denies anorexia, weight loss, weight gain, hoarseness, chest pain, dyspnea on exertion, prolonged cough, headaches, hematuria, and enlarged lymph nodes.    Physical Exam  General:  Well-developed,well-nourished,in no acute distress; alert,appropriate and cooperative throughout examination Head:  no sinus tenderness to percussion Eyes:  corneas and lenses clear and no injection.   Ears:  TMs normal Mouth:  cobblestone appearance to the posterior pharyngeal wall. Neck:  supple.   Lungs:  normal respiratory effort, normal breath sounds, no crackles, and no wheezes.   Heart:  normal rate and regular rhythm.   Skin:  turgor normal, color normal, and no rashes.   Cervical Nodes:  no anterior cervical adenopathy and no posterior cervical adenopathy.     Impression & Recommendations:  Problem # 1:  PHARYNGITIS-ACUTE ([id number]) acute pharyngitis most likely viral based on history and exam  Plan - supportive care           advised to watch for signs of super-infection and to call for high fever, purulent drainage, lymphadenopathy.  His updated medication list for this problem includes:    Bl Aspirin 325 Mg Tabs (Aspirin) .[name]... Take 1 by mouth qd  Complete Medication List: 1)  Bl Aspirin 325 Mg Tabs (Aspirin) .... Take 1 by mouth qd 2)  Klor-con 8 Meq Tbcr (Potassium chloride) .... Take 1 by mouth qd 3)  Folic Acid 1 Mg Tabs (Folic acid) .[name].. 1 once daily 4)  Zocor 20 Mg Tabs (Simvastatin) .... Take 1 by mouth qd 5)  Toprol Xl 50 Mg Tb24 (Metoprolol succinate) .... Take 1/2 tablet by mouth two times a day 6)  Nitroglycerin 0.4 Mg Subl (Nitroglycerin) .[name].. 1 tab sublingual every 46min as needed for chest pain 7)  Gemfibrozil 600 Mg Tabs (Gemfibrozil) .[name].. 1 two times a day 8)  Colchicine 0.6 Mg Tabs (Colchicine) .[name].. 1 once daily gout prevention 9)  Vitamin D 1.25  .... Take 1 tablet by  mouth once a month 10)  Amlodipine Besylate 10 Mg Tabs (Amlodipine besylate) 11)  Clotrimazole-betamethasone 1-0.05 % Crea (Clotrimazole-betamethasone) .[name].. 1pply two times a day to rash as needed

## 2010-02-09 NOTE — Progress Notes (Signed)
Summary: ASPRIN  Phone Note Call from Patient Call back at Home Phone [phone]   Caller: Patient---[phone] Call For: Neena Rhymes MD Summary of Call: Pt left message on triage A, regarding aspirin please call him. Initial call taken by: Denice Paradise,  January 20, 2010 2:19 PM  Follow-up for Phone Call        Spoke w/pt gave samples of asprin we have, per pt otc asprin was recalled and not many options avail Follow-up by: Charlsie Quest, CMA,  January 20, 2010 4:56 PM

## 2010-02-09 NOTE — Letter (Signed)
[facility]: [facility]   Imported By: Phillis Knack 10/18/2009 14:15:27  _____________________________________________________________________  External Attachment:    Type:   Image     Comment:   External Document

## 2010-02-09 NOTE — Assessment & Plan Note (Signed)
Summary: [name] [name]  Nurse Visit   Allergies: 1)  Codeine 2)  * Ecotrin 3)  Hydrocodone 4)  * Trimox 5)  * Vibromycin 6)  Macrobid 7)  Cipro 8)  * Hydroxytine 9)  * Atarax 10)  Advicor (Niacin-Lovastatin) 11)  Niacin  Medication Administration  Injection # 1:    Medication: Vit B12 1000 mcg    Diagnosis: PERIPHERAL NEUROPATHY, FEET (ICD-356.9)    Route: IM    Site: L deltoid    Exp Date: 12/2010    Lot #: [id number]    Mfr: [place]    Patient tolerated injection without complications    Given by: Ami Bullins CMA (May 27, 2009 9:37 AM)  Orders Added: 1)  Admin of Therapeutic Inj  intramuscular or subcutaneous [96372] 2)  Vit B12 1000 mcg [id number]

## 2010-02-09 NOTE — Progress Notes (Signed)
    Immunization History:  Influenza Immunization History:    Influenza:  historical (09/15/2009)

## 2010-02-09 NOTE — Letter (Signed)
Summary: [facility]   Imported By: Bubba Hales 03/28/2009 10:09:40  _____________________________________________________________________  External Attachment:    Type:   Image     Comment:   External Document

## 2010-02-09 NOTE — Assessment & Plan Note (Signed)
Summary: [name] [name]  Nurse Visit   Allergies: 1)  Codeine 2)  * Ecotrin 3)  Hydrocodone 4)  * Trimox 5)  * Vibromycin 6)  Macrobid 7)  Cipro 8)  * Hydroxytine 9)  * Atarax 10)  Advicor (Niacin-Lovastatin) 11)  Niacin  Medication Administration  Injection # 1:    Medication: Vit B12 1000 mcg    Diagnosis: B12 DEFICIENCY (ICD-266.2)    Route: IM    Site: L deltoid    Exp Date: 05/09/2011    Lot #: [id number]    Mfr: [place]    Patient tolerated injection without complications    Given by: Charlsie Quest, CMA (August 23, 2009 2:03 PM)  Orders Added: 1)  Admin of Therapeutic Inj  intramuscular or subcutaneous [96372] 2)  Vit B12 1000 mcg [[id number]]   Medication Administration  Injection # 1:    Medication: Vit B12 1000 mcg    Diagnosis: B12 DEFICIENCY (ICD-266.2)    Route: IM    Site: L deltoid    Exp Date: 05/09/2011    Lot #: [id number]    Mfr: [place]    Patient tolerated injection without complications    Given by: Charlsie Quest, CMA (August 23, 2009 2:03 PM)  Orders Added: 1)  Admin of Therapeutic Inj  intramuscular or subcutaneous [96372] 2)  Vit B12 1000 mcg [id number]

## 2010-02-09 NOTE — Miscellaneous (Signed)
Summary: Flu Vaccination/[facility]  [facility] Vaccination/[facility]   Imported By: Phillis Knack 09/20/2009 07:51:47  _____________________________________________________________________  External Attachment:    Type:   Image     Comment:   External Document

## 2010-02-09 NOTE — Progress Notes (Signed)
Summary: results/[facility] pt  Phone Note Call from Patient Call back at Home Phone [phone]   Caller: Patient Reason for Call: Lab or Test Results Summary of Call: Patient called requesting results of recent labs Initial call taken by: Estell Harpin CMA,  April 22, 2009 4:25 PM  Follow-up for Phone Call        B12 level is low and needs to be treated Follow-up by: Janith Lima MD,  April 23, 2009 2:30 PM  Additional Follow-up for Phone Call Additional follow up Details #1::        please have the patient come in to begin B12 shot 1063mcg monthly or he may have Rx for Nasocabal spray if he wishes. Additional Follow-up by: Neena Rhymes MD,  April 24, 2009 4:32 PM    Additional Follow-up for Phone Call Additional follow up Details #2::    lmoam for pt to call back Follow-up by: Ami Bullins CMA,  April 25, 2009 9:55 AM  Additional Follow-up for Phone Call Additional follow up Details #3:: Details for Additional Follow-up Action Taken: informed pt and request monthly B12 injections here at the office. pt was sent to sch to be set up for monthly nurse visits Additional Follow-up by: Ami Bullins CMA,  April 25, 2009 10:41 AM

## 2010-02-09 NOTE — Progress Notes (Signed)
       New/Updated Medications: TOPROL XL 50 MG  TB24 (METOPROLOL SUCCINATE) Take 1 tablet by mouth once a day Prescriptions: TOPROL XL 50 MG  TB24 (METOPROLOL SUCCINATE) Take 1 tablet by mouth once a day  #90 x 3   Entered by:   Ami Bullins CMA   Authorized by:   Neena Rhymes MD   Signed by:   Charlynne Cousins CMA on 10/06/2009   Method used:   Faxed to ...       [facility]       [address]  [address]       Ph: [phone]       Fax: [phone]   RxID:   [id number]

## 2010-02-09 NOTE — Assessment & Plan Note (Signed)
Summary: low temp/sores on mouth/cd   Vital Signs:  Patient profile:   77 year old male Height:      72 inches Weight:      176 pounds BMI:     23.96 O2 Sat:      97 % on Room air Temp:     97.7 degrees F oral Pulse rate:   65 / minute BP sitting:   94 / 58  (left arm) Cuff size:   regular  Vitals Entered By: Charlynne Cousins CMA (January 16, 2010 1:01 PM)  O2 Flow:  Room air CC: Pt here for evaluation of sores on his lips ( started using abreva yesterday) but states they are some what better. He states his temp has been lower than normal and has ongoing weakness and fatigue. Pt states yesterday his tongue felt weird and develped a groove in the middle of his tongue. He also states his left foot has felt numb with sensation going up his left leg and describes a "cold feeling". / ab   Primary Care Provider:  Adella Hare MD  CC:  Pt here for evaluation of sores on his lips ( started using abreva yesterday) but states they are some what better. He states his temp has been lower than normal and has ongoing weakness and fatigue. Pt states yesterday his tongue felt weird and develped a groove in the middle of his tongue. He also states his left foot has felt numb with sensation going up his left leg and describes a "cold feeling". / ab.  History of Present Illness: Patient recently seen for febrile illness thought to be flu-like illness and he was treated with Tamiflu. He did well and his fever resolved.  He presents to day with a 5 days history of small blisters on is upper lip c/w fever blisters. He also has a furrowed tongue which is not painful. He has no trouble with swallow or taste. He has had no further fevers, coryza, sore throat, respiratory symptoms.   Current Medications (verified): 1)  Bl Aspirin 325 Mg  Tabs (Aspirin) .... Take 1 By Mouth Qd 2)  Klor-Con 8 Meq  Tbcr (Potassium Chloride) .... Take 1 By Mouth Qd 3)  Folic Acid 1 Mg  Tabs (Folic Acid) .[name].. 1 Once Daily 4)  Zocor  20 Mg  Tabs (Simvastatin) .... Take 1 By Mouth Qd 5)  Toprol Xl 50 Mg  Tb24 (Metoprolol Succinate) .... Take 1 Tablet By Mouth Once A Day 6)  Nitroglycerin 0.4 Mg  Subl (Nitroglycerin) .[name].. 1 Tab Sublingual Every 24min As Needed For Chest Pain 7)  Gemfibrozil 600 Mg  Tabs (Gemfibrozil) .[name].. 1 Two Times A Day 8)  Colcrys 0.6 Mg Tabs (Colchicine) .[name].. 1 Daily For Gout Prevention 9)  Vitamin D 1.25 .... Take 1 Tablet By Mouth Once A Month 10)  Amlodipine Besylate 10 Mg Tabs (Amlodipine Besylate) .... Take 1 Tablet By Mouth Once A Day 11)  Clotrimazole-Betamethasone 1-0.05 % Crea (Clotrimazole-Betamethasone) .[name].. 1pply Two Times A Day To Rash As Needed 12)  Trazodone Hcl 50 Mg Tabs (Trazodone Hcl) .... Take 1 Tab By Mouth At Bedtime 13)  Benzonatate 100 Mg Caps (Benzonatate) .[name].. 1 By Mouth Three Times A Day For Scratchy Cough. 14)  Nexium 40 Mg Cpdr (Esomeprazole Magnesium) .[name].. 1 By Mouth Qam  Allergies (verified): 1)  Codeine 2)  * Ecotrin 3)  Hydrocodone 4)  * Trimox 5)  * Vibromycin 6)  Macrobid 7)  Cipro 8)  *  Hydroxytine 9)  * Atarax 10)  Advicor (Niacin-Lovastatin) 11)  Niacin PMH-FH-SH reviewed-no changes except otherwise noted  Review of Systems  The patient denies anorexia, fever, weight loss, hoarseness, dyspnea on exertion, headaches, abdominal pain, severe indigestion/heartburn, muscle weakness, difficulty walking, depression, abnormal bleeding, and enlarged lymph nodes.    Physical Exam  General:  Well-developed,well-nourished,in no acute distress; alert,appropriate and cooperative throughout examination Head:  [place]/AT Eyes:  C&S clear,pupils equal and pupils round.   Mouth:  Two red,ulcerated lesion on the mid-upper lip and left upper lip. No ulcers tongue or buccal membrane. Tongue with white coating. There is a deep mid-line furrow Lungs:  normal respiratory effort, normal breath sounds, no crackles, and no wheezes.   Heart:  normal rate and regular rhythm.   Abdomen:   soft and normal bowel sounds.   Neurologic:  alert & oriented X3 and gait normal.   Skin:  turgor normal and color normal.  blisters on lip Cervical Nodes:  no anterior cervical adenopathy and no posterior cervical adenopathy.   Psych:  Oriented X3, good eye contact, and not anxious appearing.     Impression & Recommendations:  Problem # 1:  FEVER BLISTER (ICD-054.9) Patient with two small fever blisters. At 5 days I explained that treatment may help but it may be too late. Treatment of choice - Valtrex 2g two times a day x 1 day. He wishes to take medication. No need for treatmentof furrowed tongue with no eveidence of thrush.  Plan - valtrex 2g twice in 24 hours.   Complete Medication List: 1)  Bl Aspirin 325 Mg Tabs (Aspirin) .... Take 1 by mouth qd 2)  Klor-con 8 Meq Tbcr (Potassium chloride) .... Take 1 by mouth qd 3)  Folic Acid 1 Mg Tabs (Folic acid) .[name].. 1 once daily 4)  Zocor 20 Mg Tabs (Simvastatin) .... Take 1 by mouth qd 5)  Toprol Xl 50 Mg Tb24 (Metoprolol succinate) .... Take 1 tablet by mouth once a day 6)  Nitroglycerin 0.4 Mg Subl (Nitroglycerin) .[name].. 1 tab sublingual every 9min as needed for chest pain 7)  Gemfibrozil 600 Mg Tabs (Gemfibrozil) .[name].. 1 two times a day 8)  Colcrys 0.6 Mg Tabs (Colchicine) .[name].. 1 daily for gout prevention 9)  Vitamin D 1.25  .... Take 1 tablet by mouth once a month 10)  Amlodipine Besylate 10 Mg Tabs (Amlodipine besylate) .... Take 1 tablet by mouth once a day 11)  Clotrimazole-betamethasone 1-0.05 % Crea (Clotrimazole-betamethasone) .[name].. 1pply two times a day to rash as needed 12)  Trazodone Hcl 50 Mg Tabs (Trazodone hcl) .... Take 1 tab by mouth at bedtime 13)  Benzonatate 100 Mg Caps (Benzonatate) .[name].. 1 by mouth three times a day for scratchy cough. 14)  Nexium 40 Mg Cpdr (Esomeprazole magnesium) .[name].. 1 by mouth qam 15)  Valtrex 1 Gm Tabs (Valacyclovir hcl) .... 2 caps am, 2 caps pm x 1 day (total of 4 caps) Prescriptions: VALTREX 1  GM TABS (VALACYCLOVIR HCL) 2 caps AM, 2 Caps PM x 1 day (total of 4 caps)  #4 x 0   Entered and Authorized by:   Neena Rhymes MD   Signed by:   Neena Rhymes MD on 01/16/2010   Method used:   Print then Give to Patient   RxID:   [id number] AMLODIPINE BESYLATE 10 MG TABS (AMLODIPINE BESYLATE) Take 1 tablet by mouth once a day  #90 x 3   Entered and Authorized by:   Heinz Knuckles  Melynda Krzywicki MD   Signed by:   Neena Rhymes MD on 01/16/2010   Method used:   Print then Give to Patient   RxID:   [id number] GEMFIBROZIL 600 MG  TABS (GEMFIBROZIL) 1 two times a day  #180 x 3   Entered and Authorized by:   Neena Rhymes MD   Signed by:   Neena Rhymes MD on 01/16/2010   Method used:   Print then Give to Patient   RxID:   [id number]    Orders Added: 1)  Est. Patient Level III [id number]

## 2010-02-09 NOTE — Letter (Signed)
[facility]: [facility]   Imported By: Phillis Knack 04/26/2009 09:23:29  _____________________________________________________________________  External Attachment:    Type:   Image     Comment:   External Document

## 2010-02-09 NOTE — Progress Notes (Signed)
Summary: REFILL  Phone Note Call from Patient Call back at Home Phone [phone]   Reason for Call: Talk to Nurse Summary of Call: REQUEST REFILL ON METOPROLOL 50MG , NEEDS A WEEK AND HALF [place] Initial call taken by: Darnell Level,  October 04, 2009 11:31 AM    Prescriptions: TOPROL XL 50 MG  TB24 (METOPROLOL SUCCINATE) Take 1/2 tablet by mouth once a day  #60 x 3   Entered by:   Ami Bullins CMA   Authorized by:   Neena Rhymes MD   Signed by:   Charlynne Cousins CMA on 10/04/2009   Method used:   Faxed to ...       [facility])       [address]  [address]       Ph: [phone]       Fax: [phone]   RxID:   [id number] TOPROL XL 50 MG  TB24 (METOPROLOL SUCCINATE) Take 1/2 tablet by mouth once a day  #10 x 0   Entered by:   Ami Bullins CMA   Authorized by:   Neena Rhymes MD   Signed by:   Charlynne Cousins CMA on 10/04/2009   Method used:   Electronically to        [facility]. [phone]* (retail)       [address]       [place], [place]  [address]       Ph: [phone]       Fax: [phone]   [id number]:[id number]   Appended Document: REFILL Pt called to verify date Rx was e-scribed

## 2010-02-09 NOTE — Letter (Signed)
Summary: [facility]   Imported By: Phillis Knack 09/05/2009 15:07:16  _____________________________________________________________________  External Attachment:    Type:   Image     Comment:   External Document

## 2010-02-09 NOTE — Assessment & Plan Note (Signed)
Summary: sore throat/cd   Vital Signs:  Patient profile:   77 year old male Height:      72 inches Weight:      179 pounds BMI:     24.36 O2 Sat:      97 % on Room air Temp:     97.9 degrees F oral Pulse rate:   66 / minute BP sitting:   100 / 54  (left arm) Cuff size:   regular  Vitals Entered By: Charlynne Cousins CMA (November 28, 2009 1:57 PM)  O2 Flow:  Room air CC: pt c/o head congestion and sore throat x 1 week/ ab   Primary Care Aribelle Mccosh:  Adella Hare MD  CC:  pt c/o head congestion and sore throat x 1 week/ ab.  History of Present Illness: Patient presents  for a 1 week h/o URI symptoms, hocked a big luggie and felt that his nostril was clearer. Cough is persistent, a tickle like cough. Sore throat is worse at night. A touch of laryngitis. NO fever no rigors. No N/V/D. No DOE.  Patient also with a fatty tumor at right flank.  eyelids seem to not work well first thing in the mornining.  In the interval he has had a sleep consult with Dr. Gwenette Greet and is doing better with sleep hygiene.  He has seen Dr. Gladstone Lighter for shoulder pain - partial rotator cuff tear. Better with injection and physcial therapy  Current Medications (verified): 1)  Bl Aspirin 325 Mg  Tabs (Aspirin) .... Take 1 By Mouth Qd 2)  Klor-Con 8 Meq  Tbcr (Potassium Chloride) .... Take 1 By Mouth Qd 3)  Folic Acid 1 Mg  Tabs (Folic Acid) .[name].. 1 Once Daily 4)  Zocor 20 Mg  Tabs (Simvastatin) .... Take 1 By Mouth Qd 5)  Toprol Xl 50 Mg  Tb24 (Metoprolol Succinate) .... Take 1 Tablet By Mouth Once A Day 6)  Nitroglycerin 0.4 Mg  Subl (Nitroglycerin) .[name].. 1 Tab Sublingual Every 90min As Needed For Chest Pain 7)  Gemfibrozil 600 Mg  Tabs (Gemfibrozil) .[name].. 1 Two Times A Day 8)  Colcrys 0.6 Mg Tabs (Colchicine) .[name].. 1 Daily For Gout Prevention 9)  Vitamin D 1.25 .... Take 1 Tablet By Mouth Once A Month 10)  Amlodipine Besylate 10 Mg Tabs (Amlodipine Besylate) .... Take 1 Tablet By Mouth Once A Day 11)   Clotrimazole-Betamethasone 1-0.05 % Crea (Clotrimazole-Betamethasone) .[name].. 1pply Two Times A Day To Rash As Needed 12)  Trazodone Hcl 50 Mg Tabs (Trazodone Hcl) .... Take 1 Tab By Mouth At Bedtime  Allergies (verified): 1)  Codeine 2)  * Ecotrin 3)  Hydrocodone 4)  * Trimox 5)  * Vibromycin 6)  Macrobid 7)  Cipro 8)  * Hydroxytine 9)  * Atarax 10)  Advicor (Niacin-Lovastatin) 11)  Niacin  Past History:  Past Medical History: Last updated: 09/21/2008 Prostate cancer, hx of Hypertension Coronary artery disease Dyspenia Renal insufficiency Exertional angina,resolved Dyslipideama C3-C4 and C4-C5 foraminal narrowing, severe negative MRI/MRA of the head  Past Surgical History: Last updated: 11/09/2009 S/P PTCA Cardia Catherization (07/07/1999) Peripheral Vascular Catherization (11/24/2003) Renal Circulation (10/01/2003) Stress Cardiolite (05/04/2005),(Spring '09-negative except for apical thinning, EF 68%) EDG (07/17/1994) FLEXIBLE sIGMOIDOSCOPY (11/03/1997) EKG (04/20/2005) Prostatectomy Lumbar spinal disk and neck fusion surgery stents--2 FH reviewed for relevance, SH/Risk Factors reviewed for relevance  Physical Exam  General:  Well-developed,well-nourished,in no acute distress; alert,appropriate and cooperative throughout examination Head:  Normocephalic and atraumatic without obvious abnormalities. No apparent alopecia or balding.  Eyes:  pupils equal and pupils round.  Normal lid movement. No lid lag Ears:  External ear exam shows no significant lesions or deformities.  Otoscopic examination reveals clear canals, tympanic membranes are intact bilaterally without bulging, retraction, inflammation or discharge. Hearing is grossly normal bilaterally. Mouth:  throat clear Neck:  supple and full ROM.   Lungs:  normal respiratory effort, normal breath sounds, no crackles, and no wheezes.   Heart:  normal rate, regular rhythm, no murmur, and no gallop.   Msk:  normal ROM, no  joint tenderness, and no joint swelling.   Pulses:  2+ radial Neurologic:  alert & oriented X3, cranial nerves II-XII intact, and gait normal. Normal movement of the eyelids without lidlag.    Impression & Recommendations:  Problem # 1:  COUGH (ICD-786.2) No evidence of a bacterial infection, hence no indication for antibiotics.  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  Plan - observation. To report back if there is rapid enlargement of the tumor.  Problem # 3:  EYELID RETRACTION OR LAG (ICD-374.41) Patient c/o difficulty opening his eyes in the AM. No problem during the day. No lid-lag on exam and no interference with vision. No evidence to suggest Myesthenia gravis.  Plan - continued observation.  Problem # 4:  PERSISTENT DISORDER INITIATING/MAINTAINING SLEEP (ICD-307.42) Reviewed Dr. Janifer Adie consult note. Patient does not have sleep apnea. He was counselled re: sleep hygiene. This was reviewed with him today. He is doing better with his sleep pattern.  Plan - continue good sleep practice.  Problem # 5:  SHOULDER PAIN, RIGHT, CHRONIC (ICD-719.41) Reviewed my previous note as well as orthopedic consult note. Patient with probable minor rotator cuff tear right shoulder. Not a candidate for surgical intervention at this time. He will continue with ROM exercise as prescribed by ortho.  His updated medication list for this problem includes:    Bl Aspirin 325 Mg Tabs (Aspirin) .[name]... Take 1 by mouth qd  Problem # 6:  PROSTATE CANCER, HX OF (ICD-V10.46) Patient has become intermittently insensate in regard to bladder emptying and will wind up wet. He has not developed any skin rash or breakdown.  Plan - no medications indcated at this time. He may want to check with his urologist.           scheduled  micturition and scheduled times to check depends to prevent skin breakdown.   Problem # 7:  HYPERTENSION (ICD-401.9)  His updated medication list for this problem includes:    Toprol Xl 50 Mg Tb24 (Metoprolol succinate) .[name]... Take 1 tablet by mouth once a day    Amlodipine Besylate 10 Mg Tabs (Amlodipine besylate) .[name]... Take 1 tablet by mouth once a day  BP today: 100/54 Prior BP: 108/60 (11/09/2009)  Labs Reviewed: K+: 4.6 (08/05/2008) Creat: : 2.3 (08/05/2008)    continued control, if not too low. Will defer any changes in medication to Dr. Dannielle Burn  Problem # 8:  RENAL INSUFFICIENCY (ICD-588.9) Continues to follow with Dr. Justin Mend. Evidently doing ok. Chart reviewed:                08/05/08        02/16/09      08/23/09   Cr         2.3  1.82         2.02  Complete Medication List: 1)  Bl Aspirin 325 Mg Tabs (Aspirin) .... Take 1 by mouth qd 2)  Klor-con 8 Meq Tbcr (Potassium chloride) .... Take 1 by mouth qd 3)  Folic Acid 1 Mg Tabs (Folic acid) .[name].. 1 once daily 4)  Zocor 20 Mg Tabs (Simvastatin) .... Take 1 by mouth qd 5)  Toprol Xl 50 Mg Tb24 (Metoprolol succinate) .... Take 1 tablet by mouth once a day 6)  Nitroglycerin 0.4 Mg Subl (Nitroglycerin) .[name].. 1 tab sublingual every 33min as needed for chest pain 7)  Gemfibrozil 600 Mg Tabs (Gemfibrozil) .[name].. 1 two times a day 8)  Colcrys 0.6 Mg Tabs (Colchicine) .[name].. 1 daily for gout prevention 9)  Vitamin D 1.25  .... Take 1 tablet by mouth once a month 10)  Amlodipine Besylate 10 Mg Tabs (Amlodipine besylate) .... Take 1 tablet by mouth once a day 11)  Clotrimazole-betamethasone 1-0.05 % Crea (Clotrimazole-betamethasone) .[name].. 1pply two times a day to rash as needed 12)  Trazodone Hcl 50 Mg Tabs (Trazodone hcl) .... Take 1 tab by mouth at bedtime 13)  Benzonatate 100 Mg Caps (Benzonatate) .[name].. 1 by mouth three times a day for scratchy cough.  Patient Instructions: 1)  cough - will try benzonatate 100mg  three times a day  for cough. For congestion - nasal saline spray ad lib, stove top vaporizer. 2)  Fatty tumor on the side is almost certainly a lipoma - harmless. does not need treatment unless rapidly growing.  3)  Eyelids appear normal. No need for neuro exam at this time. If the problem gets worse, especially if you have lid lag will consider neuro referral for evaluation and to rule out Myesthenia Gravis.  4)  Bladder leakage - no good answers about treatment. Need to check depends on regular schedule 5)  Sleep - keep up the sleep hygeine and sleeping out of town.  Prescriptions: BENZONATATE 100 MG CAPS (BENZONATATE) 1 by mouth three times a day for scratchy cough.  #30 x 1   Entered and Authorized by:   Neena Rhymes MD   Signed by:   Neena Rhymes MD on 11/28/2009   Method used:   Electronically to        [facility]. [phone]* (retail)       [address]       [place], [place]  [address]       Ph: [phone]       Fax: [phone]   RxID:   [id number]    Orders Added: 1)  Est. Patient Level IV [id number]

## 2010-02-09 NOTE — Assessment & Plan Note (Signed)
Summary: ANKLES TO FEET/NUMB FEELING X 1 WK--STC   Vital Signs:  Patient profile:   77 year old male Height:      72 inches Weight:      178 pounds BMI:     24.23 O2 Sat:      96 % on Room air Temp:     98.5 degrees F oral Pulse rate:   67 / minute BP sitting:   102 / 62  (right arm) Cuff size:   regular  Vitals Entered By: Charlynne Cousins CMA (April 11, 2009 4:13 PM)  O2 Flow:  Room air CC: pt c/o numbness in both feet x 1 month/ ab   Primary Care Provider:  Payden Bonus  CC:  pt c/o numbness in both feet x 1 month/ ab.  History of Present Illness: Having paresthesia distal foot left greater than right.  The numbness is intermittent. No obivous triggers. He has had a knee injury 50 years ago. He has no peripheral edema.   He seems to be doing well. He follows closely with Dr. Dannielle Burn. He continues in cardiac rehab. He has no cardiac symptoms at todays presentation.    Current Medications (verified): 1)  Bl Aspirin 325 Mg  Tabs (Aspirin) .... Take 1 By Mouth Qd 2)  Klor-Con 8 Meq  Tbcr (Potassium Chloride) .... Take 1 By Mouth Qd 3)  Folic Acid 1 Mg  Tabs (Folic Acid) .[name].. 1 Once Daily 4)  Zocor 20 Mg  Tabs (Simvastatin) .... Take 1 By Mouth Qd 5)  Toprol Xl 50 Mg  Tb24 (Metoprolol Succinate) .... Take 1/2 Tablet By Mouth Two Times A Day 6)  Nitroglycerin 0.4 Mg  Subl (Nitroglycerin) .[name].. 1 Tab Sublingual Every 71min As Needed For Chest Pain 7)  Gemfibrozil 600 Mg  Tabs (Gemfibrozil) .[name].. 1 Two Times A Day 8)  Colchicine 0.6 Mg Tabs (Colchicine) .[name].. 1 Once Daily Gout Prevention 9)  Vitamin D 1.25 .... Take 1 Tablet By Mouth Once A Month 10)  Amlodipine Besylate 10 Mg Tabs (Amlodipine Besylate) .... Take 1 Tablet By Mouth Once A Day 11)  Clotrimazole-Betamethasone 1-0.05 % Crea (Clotrimazole-Betamethasone) .[name].. 1pply Two Times A Day To Rash As Needed  Allergies (verified): 1)  Codeine 2)  * Ecotrin 3)  Hydrocodone 4)  * Trimox 5)  * Vibromycin 6)  Macrobid 7)  Cipro 8)  *  Hydroxytine 9)  * Atarax 10)  Advicor (Niacin-Lovastatin) 11)  Niacin  Past History:  Past Medical History: Last updated: 09/21/2008 Prostate cancer, hx of Hypertension Coronary artery disease Dyspenia Renal insufficiency Exertional angina,resolved Dyslipideama C3-C4 and C4-C5 foraminal narrowing, severe negative MRI/MRA of the head  Past Surgical History: Last updated: 07/19/2008 S/P PTCA Cardia Catherization (07/07/1999) Peripheral Vascular Catherization (11/24/2003) Renal Circulation (10/01/2003) Stress Cardiolite (05/04/2005),(Spring '09-negative except for apical thinning, EF 68%) EDG (07/17/1994) FLEXIBLE sIGMOIDOSCOPY (11/03/1997) EKG (04/20/2005) Prostatectomy Lumbar spinal disk and neck fusion surgery PSH reviewed for relevance, FH reviewed for relevance  Review of Systems  The patient denies anorexia, fever, weight loss, hoarseness, chest pain, dyspnea on exertion, prolonged cough, abdominal pain, muscle weakness, difficulty walking, and enlarged lymph nodes.    Physical Exam  General:  Well-developed,well-nourished,in no acute distress; alert,appropriate and cooperative throughout examination Heart:  normal rate and regular rhythm.   Pulses:  2+ DP bilaterally, 2_+ posterior tibial. Good capilary refill to the tips of his toes. Neurologic:  nl light touch. Diminished deep vibratory sensation distal feet.   Impression & Recommendations:  Problem # 1:  PERIPHERAL NEUROPATHY, FEET (ICD-356.9) Vascularly intact. No evidence of CNS lesion. Metaolic vs idiopathic etiology  Plan - B12 level.           if discomfort becomes significant - gabapentin may be helpful.  Orders: TLB-B12 + Folate Pnl [id number]  Complete Medication List: 1)  Bl Aspirin 325 Mg Tabs (Aspirin) .... Take 1 by mouth qd 2)  Klor-con 8 Meq Tbcr (Potassium chloride) .... Take 1 by mouth qd 3)  Folic Acid 1 Mg Tabs (Folic acid) .[name].. 1 once daily 4)  Zocor 20 Mg Tabs (Simvastatin)  .... Take 1 by mouth qd 5)  Toprol Xl 50 Mg Tb24 (Metoprolol succinate) .... Take 1/2 tablet by mouth two times a day 6)  Nitroglycerin 0.4 Mg Subl (Nitroglycerin) .[name].. 1 tab sublingual every 26min as needed for chest pain 7)  Gemfibrozil 600 Mg Tabs (Gemfibrozil) .[name].. 1 two times a day 8)  Colchicine 0.6 Mg Tabs (Colchicine) .[name].. 1 once daily gout prevention 9)  Vitamin D 1.25  .... Take 1 tablet by mouth once a month 10)  Amlodipine Besylate 10 Mg Tabs (Amlodipine besylate) .... Take 1 tablet by mouth once a day 11)  Clotrimazole-betamethasone 1-0.05 % Crea (Clotrimazole-betamethasone) .[name].. 1pply two times a day to rash as needed

## 2010-02-09 NOTE — Progress Notes (Signed)
  Phone Note Refill Request Message from:  Patient on January 12, 2009 9:46 AM  Refills Requested: Medication #1:  AMLODIPINE BESYLATE 10 MG TABS  Medication #2:  GEMFIBROZIL 600 MG  TABS 1 two times a day  Medication #3:  KLOR-CON 8 MEQ  TBCR TAKE 1 by mouth QD  Medication #4:  ZOCOR 20 MG  TABS TAKE 1 by mouth QD Initial call taken by: Ami Bullins CMA,  January 12, 2009 9:50 AM  Follow-up for Phone Call        prescriptions put on MD's desk to sign Follow-up by: [place],  January 12, 2009 9:51 AM  Additional Follow-up for Phone Call Additional follow up Details #1::        prescriptions signed. they are up front in cabinet. pt informed to pick up Additional Follow-up by: Ami Bullins CMA,  January 12, 2009 10:26 AM    Prescriptions: AMLODIPINE BESYLATE 10 MG TABS (AMLODIPINE BESYLATE)   #90 x 3   Entered by:   Ami Bullins CMA   Authorized by:   Neena Rhymes MD   Signed by:   Charlynne Cousins CMA on 01/12/2009   Method used:   Print then Give to Patient   RxID:   [id number] GEMFIBROZIL 600 MG  TABS (GEMFIBROZIL) 1 two times a day  #180 x 3   Entered by:   Ami Bullins CMA   Authorized by:   Neena Rhymes MD   Signed by:   Charlynne Cousins CMA on 01/12/2009   Method used:   Print then Give to Patient   RxID:   [id number] ZOCOR 20 MG  TABS (SIMVASTATIN) TAKE 1 by mouth QD  #90 x 3   Entered by:   Ami Bullins CMA   Authorized by:   Neena Rhymes MD   Signed by:   Charlynne Cousins CMA on 01/12/2009   Method used:   Print then Give to Patient   RxID:   [id number] KLOR-CON 8 MEQ  TBCR (POTASSIUM CHLORIDE) TAKE 1 by mouth QD  #90 x 3   Entered by:   Ami Bullins CMA   Authorized by:   Neena Rhymes MD   Signed by:   Charlynne Cousins CMA on 01/12/2009   Method used:   Print then Give to Patient   RxID:   [id number]

## 2010-02-09 NOTE — Progress Notes (Signed)
  Phone Note Call from Patient Call back at Home Phone [phone]   Caller: Patient Summary of Call: Patient called stating that per [facility] script rx was mailed out 10/12/09. He is requesting a a local 4 day supply sent to [facility]. Patient did not state which medication this should be for. Initial call taken by: Estell Harpin CMA,  October 18, 2009 9:49 AM  Follow-up for Phone Call        LEft vm for pt to check w/his pharm Follow-up by: Charlsie Quest, CMA,  October 18, 2009 5:16 PM    Prescriptions: TOPROL XL 50 MG  TB24 (METOPROLOL SUCCINATE) Take 1 tablet by mouth once a day  #14 x 1   Entered by:   Charlsie Quest, CMA   Authorized by:   Neena Rhymes MD   Signed by:   Charlsie Quest, CMA on 10/18/2009   Method used:   Electronically to        [facility]. [phone]* (retail)       [address]       [place], [place]  [address]       Ph: [phone]       Fax: [phone]   RxID:   [id number]

## 2010-02-09 NOTE — Assessment & Plan Note (Signed)
Summary: [name] [name]  Nurse Visit   Allergies: 1)  Codeine 2)  * Ecotrin 3)  Hydrocodone 4)  * Trimox 5)  * Vibromycin 6)  Macrobid 7)  Cipro 8)  * Hydroxytine 9)  * Atarax 10)  Advicor (Niacin-Lovastatin) 11)  Niacin  Medication Administration  Injection # 1:    Medication: Vit B12 1000 mcg    Diagnosis: B12 DEFICIENCY (ICD-266.2)    Route: IM    Site: R deltoid    Exp Date: 06/09/2011    Lot #: [id number]    Mfr: [email or web address]    Patient tolerated injection without complications    Given by: Jonathon Resides, CMA(AAMA) (September 22, 2009 1:50 PM)  Orders Added: 1)  Admin of Therapeutic Inj  intramuscular or subcutaneous [[id number]] 2)  Vit B12 1000 mcg [id number]

## 2010-02-09 NOTE — Letter (Signed)
   [facility] [place], [place]  [address] Phone: [phone]      June 23, 2009   [facility] [address] [place], [place] [address]  RE:  LAB RESULTS  Dear  Mr. Cloke,  The following is an interpretation of your most recent lab tests.  Please take note of any instructions provided or changes to medications that have resulted from your lab work.  Health professionals look at cholesterol as more involved than just the total cholesterol. We consider the level of LDL (bad) cholesterol, HDL (good), cholesterol, and Triglycerides (Grease) in the blood.  1. Your LDL should be under 100, and the HDL should be over 45, if you have any vascular disease such as heart attack, angina, stroke, TIA (mini stroke), claudication (pain in the legs when you walk due to poor circulation),  Abdominal Aortic Aneurysm (AAA), diabetes or prediabetes.  2. Your LDL should be under 130 if you have any two of the following:     a. Smoke or chew tobacco,     b. High blood pressure (if you are on medication or over 140/90 without medication),     c. Male gender,    d. HDL below 40,    e. A male relative (father, brother, or son), who have had any vascular event          as described in #1. above under the age of 110, or a male relative (mother,       sister, or daughter) who had an event as described above under age 83. (An HDL over 60 will subtract one risk factor from the total, so if you have two items in # 2 above, but an HDL over 60, you then fall into category # 3 below).  3. Your LDL should be under 160 if you have any one of the above.  Triglycerides should be under 200 with the ideal being under 150.  For diabetes or pre-diabetes, the ideal HgbA1C should be under 6.0%.  If you fall into any of the above categories, you should make a follow up appointment to discuss this with your physician.  LIPID PANEL:  Good - no changes needed Triglyceride: 80.0    Cholesterol: 133   LDL: 86   HDL: 30.90   Chol/HDL%:  4    Good control. Good work   Sincerely Yours,    Neena Rhymes MD

## 2010-02-09 NOTE — Assessment & Plan Note (Signed)
Summary: 6 MO FU PER MARCH REMINDER-SRS   Visit Type:  Follow-up Primary Provider:  Norins  CC:  follow-up visit.  History of Present Illness: the patient is a 77 year old male with history of coronary artery disease.the patient reports no angina. He does report a left-sided sharp pain which typically lasts seconds it comes on intermittently at rest and on exertion.there is no pleuritic component to his pain. He also has developed numbness and decreased sensation in both feet. There is no loss of motor function however. The patient is able to perform a raised  heel  maneuver. The patient still remains very active and works out at [employer] with his wife. He has recently had recurrent UTIs and has been seeing a neurologist. He also has seen neurology for persistent headaches.  Clinical Review Panels:  Lab Work TSH 2.21 (08/05/2008) BUN 33 (08/05/2008) Creatinine 2.3 (08/05/2008)  Lipid Levels LDL 82 (12/13/2008) HDL 32.10 (12/13/2008) Cholesterol 130 (12/13/2008) Triglyceride 107 (01/16/2006)  CXR CXR results Prelimary reading- normal heartsize, no infiltrates, clear lung fields (08/05/2008)  Cardiac Imaging Cardiac Cath Findings     CONCLUSIONS:   1. Continued patency of the RCA stents.   2. Modest lesion just prior to the crux and moderately severe stenosis       in the continuation branch leading into the posterolateral segment.   3. Multiple diffuse tandem lesions in the small caliber distal LAD.      DISPOSITION:  My leaning at this time would be in the direction of   medical therapy given the patient's renal insufficiency.  If we were to   have an intervention, we would likely recommend a small-caliber stent in   the continuation branch after the PDA takeoff.  However, initially I   would probably favor medical therapy.  We will have him follow up with   Dr. Dannielle Burn.               Loretha Brasil. Lia Foyer, MD, [facility] (10/17/2007)    Preventive Screening-Counseling &  Management  Alcohol-Tobacco     Smoking Status: never  Current Medications (verified): 1)  Bl Aspirin 325 Mg  Tabs (Aspirin) .... Take 1 By Mouth Qd 2)  Klor-Con 8 Meq  Tbcr (Potassium Chloride) .... Take 1 By Mouth Qd 3)  Folic Acid 1 Mg  Tabs (Folic Acid) .[name].. 1 Once Daily 4)  Zocor 20 Mg  Tabs (Simvastatin) .... Take 1 By Mouth Qd 5)  Toprol Xl 50 Mg  Tb24 (Metoprolol Succinate) .... Take 1/2 Tablet By Mouth Two Times A Day 6)  Nitroglycerin 0.4 Mg  Subl (Nitroglycerin) .[name].. 1 Tab Sublingual Every 71min As Needed For Chest Pain 7)  Gemfibrozil 600 Mg  Tabs (Gemfibrozil) .[name].. 1 Two Times A Day 8)  Colchicine 0.6 Mg Tabs (Colchicine) .[name].. 1 Once Daily Gout Prevention 9)  Vitamin D 1.25 .... Take 1 Tablet By Mouth Once A Month 10)  Amlodipine Besylate 10 Mg Tabs (Amlodipine Besylate) .... Take 1 Tablet By Mouth Once A Day 11)  Clotrimazole-Betamethasone 1-0.05 % Crea (Clotrimazole-Betamethasone) .[name].. 1pply Two Times A Day To Rash As Needed  Allergies (verified): 1)  Codeine 2)  * Ecotrin 3)  Hydrocodone 4)  * Trimox 5)  * Vibromycin 6)  Macrobid 7)  Cipro 8)  * Hydroxytine 9)  * Atarax 10)  Advicor (Niacin-Lovastatin) 11)  Niacin  Comments:  Nurse/Medical Assistant: The patient's medications and allergies were reviewed with the patient and were updated in the Medication and Allergy Lists.  List reviewed.  Past History:  Past Medical History: Last updated: 09/21/2008 Prostate cancer, hx of Hypertension Coronary artery disease Dyspenia Renal insufficiency Exertional angina,resolved Dyslipideama C3-C4 and C4-C5 foraminal narrowing, severe negative MRI/MRA of the head  Past Surgical History: Last updated: 07/19/2008 S/P PTCA Cardia Catherization (07/07/1999) Peripheral Vascular Catherization (11/24/2003) Renal Circulation (10/01/2003) Stress Cardiolite (05/04/2005),(Spring '09-negative except for apical thinning, EF 68%) EDG (07/17/1994) FLEXIBLE sIGMOIDOSCOPY  (11/03/1997) EKG (04/20/2005) Prostatectomy Lumbar spinal disk and neck fusion surgery  Family History: Last updated: 07-07-2007 father-deceased 2058-04-08: CAD/MI mother-deceased 22: exsanuinated Neg- colon cancer; DM Brother - prostate cancer  Social History: Last updated: 2007-07-07 HSG, 1 year college married 04-08-2050 - 38 years, divorced; married Apr 08, 2054 - 3 years divorced; married '63-12 yrs - divorced; married 04-07-1973 - 1 son 2055/04/08; 1 daughter - 04/08/51; 1 grandchild 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 End of life care: yes CPR, no long term mechanical ventilation, no heroic measures.  Risk Factors: Exercise: yes (01/13/2008)  Risk Factors: Smoking Status: never (03/21/2009)  Review of Systems       The patient complains of chest pain.  The patient denies fatigue, malaise, fever, weight gain/loss, vision loss, decreased hearing, hoarseness, palpitations, shortness of breath, prolonged cough, wheezing, sleep apnea, coughing up blood, abdominal pain, blood in stool, nausea, vomiting, diarrhea, heartburn, incontinence, blood in urine, muscle weakness, joint pain, leg swelling, rash, skin lesions, headache, fainting, dizziness, depression, anxiety, enlarged lymph nodes, easy bruising or bleeding, and environmental allergies.    Vital Signs:  Patient profile:   77 year old male Height:      72 inches Weight:      179 pounds Pulse rate:   63 / minute BP sitting:   96 / 53  (left arm) Cuff size:   regular  Vitals Entered By: Georgina Peer (March 21, 2009 2:05 PM) CC: follow-up visit   Physical Exam  Additional Exam:  General: Well-developed, well-nourished in no distress head: Normocephalic and atraumatic eyes PERRLA/EOMI intact, conjunctiva and lids normal nose: No deformity or lesions mouth normal dentition, normal posterior pharynx neck: Supple, no JVD.  No masses, thyromegaly or abnormal cervical nodes lungs: Normal  breath sounds bilaterally without wheezing.  Normal percussion heart: regular rate and rhythm with normal S1 and S2, no S3 or S4.  PMI is normal.  No pathological murmurs abdomen: Normal bowel sounds, abdomen is soft and nontender without masses, organomegaly or hernias noted.  No hepatosplenomegaly musculoskeletal: Back normal, normal gait muscle strength and tone normal pulsus: Pulse is normal in all 4 extremities Extremities: No peripheral pitting edema neurologic: Alert and oriented x 3 skin: Intact without lesions or rashes cervical nodes: No significant adenopathy psychologic: Normal affect    Impression & Recommendations:  Problem # 1:  CORONARY ARTERY DISEASE (ICD-414.00) patient reports atypical chest pain. His left-sided chest pain is not consistent with angina and has been very long-standing. The pattern however has not worsened. Chest x-ray also showed no lung lesions or other abnormalities that could explain his atypical pain. His updated medication list for this problem includes:    Bl Aspirin 325 Mg Tabs (Aspirin) .[name]... Take 1 by mouth qd    Toprol Xl 50 Mg Tb24 (Metoprolol succinate) .[name]... Take 1/2 tablet by mouth two times a day    Nitroglycerin 0.4 Mg Subl (Nitroglycerin) .[name]... 1 tab sublingual every 60min as needed for chest pain    Amlodipine Besylate 10 Mg Tabs (Amlodipine besylate) .Marland KitchenMarland KitchenMarland KitchenMarland Kitchen  Take 1 tablet by mouth once a day  Problem # 2:  RENAL INSUFFICIENCY (ICD-588.9) followup by nephrology  Problem # 3:  HYPERTENSION (ICD-401.9) blood pressure is well controlled. No further increase in blood pressure after stopping losartan. His updated medication list for this problem includes:    Bl Aspirin 325 Mg Tabs (Aspirin) .[name]... Take 1 by mouth qd    Toprol Xl 50 Mg Tb24 (Metoprolol succinate) .[name]... Take 1/2 tablet by mouth two times a day    Amlodipine Besylate 10 Mg Tabs (Amlodipine besylate) .[name]... Take 1 tablet by mouth once a day  Problem # 4:  PERIPHERAL NEUROPATHY,  FEET (ICD-356.9) the patient did have developed over the last several weeks for a peripheral neuropathy limited to both feet.I told the patient if symptoms persist he should discuss this with his primary care physician and in addition we should obtain an EMG and nerve conduction velocity.  Patient Instructions: 1)  Your physician recommends that you continue on your current medications as directed. Please refer to the Current Medication list given to you today. 2)  Follow up in  6 months

## 2010-02-09 NOTE — Progress Notes (Signed)
  Phone Note Call from Patient   Summary of Call: Pt is in the lab now - he would like lab order. Please help.  Initial call taken by: Charlsie Quest, [place],  December 13, 2009 10:01 AM  Follow-up for Phone Call        Order placed in [place] and verbal given to Santiago Glad in the lab.Ellison Hughs Archie CMA  December 13, 2009 10:32 AM

## 2010-02-09 NOTE — Assessment & Plan Note (Signed)
Summary: PER PT B12--PER AMI--MEN  STC  Nurse Visit   Allergies: 1)  Codeine 2)  * Ecotrin 3)  Hydrocodone 4)  * Trimox 5)  * Vibromycin 6)  Macrobid 7)  Cipro 8)  * Hydroxytine 9)  * Atarax 10)  Advicor (Niacin-Lovastatin) 11)  Niacin  Medication Administration  Injection # 1:    Medication: Vit B12 1000 mcg    Diagnosis: PERIPHERAL NEUROPATHY, FEET (ICD-356.9)    Route: IM    Site: L deltoid    Exp Date: 02/09/2011    Lot #: [id number]    Mfr: [email or web address]    Patient tolerated injection without complications    Given by: Charlsie Quest, CMA (April 26, 2009 9:46 AM)  Orders Added: 1)  Vit B12 1000 mcg [J3420] 2)  Admin of Therapeutic Inj  intramuscular or subcutaneous [[id number]]   Medication Administration  Injection # 1:    Medication: Vit B12 1000 mcg    Diagnosis: PERIPHERAL NEUROPATHY, FEET (ICD-356.9)    Route: IM    Site: L deltoid    Exp Date: 02/09/2011    Lot #: [id number]    Mfr: [place]    Patient tolerated injection without complications    Given by: Charlsie Quest, CMA (April 26, 2009 9:46 AM)  Orders Added: 1)  Vit B12 1000 mcg [J3420] 2)  Admin of Therapeutic Inj  intramuscular or subcutaneous [id number]

## 2010-02-09 NOTE — Assessment & Plan Note (Signed)
Summary: b12 Georgette Dover Bonney Leitz  Nurse Visit   Vitals Entered By: Charlsie Quest, CMA (November 25, 2009 1:43 PM)  Allergies: 1)  Codeine 2)  * Ecotrin 3)  Hydrocodone 4)  * Trimox 5)  * Vibromycin 6)  Macrobid 7)  Cipro 8)  * Hydroxytine 9)  * Atarax 10)  Advicor (Niacin-Lovastatin) 11)  Niacin  Medication Administration  Injection # 1:    Medication: Vit B12 1000 mcg    Diagnosis: B12 DEFICIENCY (ICD-266.2)    Route: IM    Site: L deltoid    Exp Date: 04/09/2011    Lot #: [id number]    Mfr: [employer] Regent    Patient tolerated injection without complications    Given by: Charlsie Quest, CMA (November 25, 2009 1:44 PM)  Orders Added: 1)  Admin of Therapeutic Inj  intramuscular or subcutaneous [96372] 2)  Vit B12 1000 mcg [J3420]  Current Allergies: CODEINE * ECOTRIN HYDROCODONE * TRIMOX * VIBROMYCIN MACROBID CIPRO * HYDROXYTINE * ATARAX ADVICOR (NIACIN-LOVASTATIN) NIACIN   Medication Administration  Injection # 1:    Medication: Vit B12 1000 mcg    Diagnosis: B12 DEFICIENCY (ICD-266.2)    Route: IM    Site: L deltoid    Exp Date: 04/09/2011    Lot #: [id number]    Mfr: [employer] Regent    Patient tolerated injection without complications    Given by: Charlsie Quest, CMA (November 25, 2009 1:44 PM)  Orders Added: 1)  Admin of Therapeutic Inj  intramuscular or subcutaneous [96372] 2)  Vit B12 1000 mcg [id number]

## 2010-02-09 NOTE — Assessment & Plan Note (Signed)
Summary: R SHOULDER PAIN AND NOT SLEEPING WELL--STC   Vital Signs:  Patient profile:   77 year old male Height:      72 inches Weight:      178 pounds BMI:     24.23 O2 Sat:      98 % on Room air Temp:     97.4 degrees F oral Pulse rate:   59 / minute BP sitting:   104 / 58  (left arm) Cuff size:   regular  Vitals Entered By: Charlynne Cousins CMA (October 14, 2009 2:02 PM)  O2 Flow:  Room air CC: pt here with several complaint. 1 fatigue even after a good night sleep. 2. Right shoulder pain on and off x 6 weeks 3. numbness in both feet/ ab   Primary Care Provider:  Norins  CC:  pt here with several complaint. 1 fatigue even after a good night sleep. 2. Right shoulder pain on and off x 6 weeks 3. numbness in both feet/ ab.  History of Present Illness: In the interval since his last visit he has seen Dr. Justin Mend for enal evaluation and was deemed stable with creatinine of 2.02. He also saw Dr. Dannielle Burn September 19th and was stable from a cardiac perspective. He saw Dr. Risa Grill for GU evaluation Oct 6th and his PSA was up a tenth and he is being evaluated for prostatitis.   Today patient presents due to excessive early morning fatigue where he will feel that he has not rested at all. His wife is present and reports that he doesn't snore but he does have a cheynes-stokes respiratory patterm (Odyne's syndrome?). This along with nocturia x 3-4 may result in the absence of deep and restfull sleep.   He does have occasional right shoulder pain - which is sharp and brief.  He has occasional stiffness in the left knee with fluid build up in th popliteal fossa. He does have a history of a knee injury with torn tendons during his [employer].   Current Medications (verified): 1)  Bl Aspirin 325 Mg  Tabs (Aspirin) .... Take 1 By Mouth Qd 2)  Klor-Con 8 Meq  Tbcr (Potassium Chloride) .... Take 1 By Mouth Qd 3)  Folic Acid 1 Mg  Tabs (Folic Acid) .[name].. 1 Once Daily 4)  Zocor 20 Mg  Tabs  (Simvastatin) .... Take 1 By Mouth Qd 5)  Toprol Xl 50 Mg  Tb24 (Metoprolol Succinate) .... Take 1 Tablet By Mouth Once A Day 6)  Nitroglycerin 0.4 Mg  Subl (Nitroglycerin) .[name].. 1 Tab Sublingual Every 47min As Needed For Chest Pain 7)  Gemfibrozil 600 Mg  Tabs (Gemfibrozil) .[name].. 1 Two Times A Day 8)  Colcrys 0.6 Mg Tabs (Colchicine) .[name].. 1 Daily For Gout Prevention 9)  Vitamin D 1.25 .... Take 1 Tablet By Mouth Once A Month 10)  Amlodipine Besylate 10 Mg Tabs (Amlodipine Besylate) .... Take 1 Tablet By Mouth Once A Day 11)  Clotrimazole-Betamethasone 1-0.05 % Crea (Clotrimazole-Betamethasone) .[name].. 1pply Two Times A Day To Rash As Needed 12)  Trazodone Hcl 50 Mg Tabs (Trazodone Hcl) .... Take 1 Tab By Mouth At Bedtime  Allergies (verified): 1)  Codeine 2)  * Ecotrin 3)  Hydrocodone 4)  * Trimox 5)  * Vibromycin 6)  Macrobid 7)  Cipro 8)  * Hydroxytine 9)  * Atarax 10)  Advicor (Niacin-Lovastatin) 11)  Niacin  Past History:  Past Medical History: Last updated: 09/21/2008 Prostate cancer, hx of Hypertension Coronary artery disease  Dyspenia Renal insufficiency Exertional angina,resolved Dyslipideama C3-C4 and C4-C5 foraminal narrowing, severe negative MRI/MRA of the head  Past Surgical History: Last updated: 07/19/2008 S/P PTCA Cardia Catherization (07/07/1999) Peripheral Vascular Catherization (11/24/2003) Renal Circulation (10/01/2003) Stress Cardiolite (05/04/2005),(Spring '09-negative except for apical thinning, EF 68%) EDG (07/17/1994) FLEXIBLE sIGMOIDOSCOPY (11/03/1997) EKG (04/20/2005) Prostatectomy Lumbar spinal disk and neck fusion surgery  Family History: Last updated: 07/04/2007 father-deceased 04/05/2058: CAD/MI mother-deceased 22: exsanuinated Neg- colon cancer; DM Brother - prostate cancer  Social History: Last updated: 2007/07/04 HSG, 1 year college married Apr 05, 2050 - 41 years, divorced; married 2054/04/05 - 3 years divorced; married '63-12 yrs - divorced; married 04-04-1973 - 1  son 05-Apr-2055; 1 daughter - 05-Apr-2051; 1 grandchild 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 End of life care: yes CPR, no long term mechanical ventilation, no heroic measures.  Review of Systems  The patient denies fever, weight loss, weight gain, decreased hearing, hoarseness, chest pain, syncope, dyspnea on exertion, prolonged cough, headaches, abdominal pain, hematochezia, hematuria, difficulty walking, unusual weight change, and enlarged lymph nodes.    Physical Exam  General:  WNWD white male in no disress Head:  normocephalic and atraumatic.   Eyes:  pupils equal and pupils round.  C&S clear Neck:  supple and full ROM.   Chest Wall:  no deformities.   Lungs:  normal respiratory effort and normal breath sounds.   Heart:  normal rate, regular rhythm, and no JVD.   Msk:  normal passive ROM right shoulder without click or crepitus. He has pain  and limiation of ROM with abduction, normal rotation. Pulses:  2+ radial pulse Neurologic:  alert & oriented X3, cranial nerves II-XII intact, gait normal, and DTRs symmetrical and normal.   Skin:  turgor normal and color normal.   Psych:  Oriented X3, normally interactive, good eye contact, and not anxious appearing.     Impression & Recommendations:  Problem # 1:  HYPERTENSION (ICD-401.9)  His updated medication list for this problem includes:    Toprol Xl 50 Mg Tb24 (Metoprolol succinate) .[name]... Take 1 tablet by mouth once a day    Amlodipine Besylate 10 Mg Tabs (Amlodipine besylate) .[name]... Take 1 tablet by mouth once a day  BP today: 104/58 Prior BP: 107/63 (09/21/2009)  Labs Reviewed: K+: 4.6 (08/05/2008) Creat: : 2.3 (08/05/2008)     BP at low end of acceptable range. He denies having any symptoms of hypotension.  Plan - will defer medication management to cardiology  Problem # 2:  INSOMNIA (ICD-780.52) Patient feels he is never rested. Per his wife he doesn't snore or  have breath-holding. He reports that he has had sleep study and was not found to have OSA. Per his wife he does have a Tyler type of respiratory pattern.   Plan - refer to Dr. Gwenette Greet for sleep medicine evaluation.  Orders: Sleep Disorder Referral (Sleep Disorder)  Problem # 3:  SHOULDER PAIN, RIGHT, CHRONIC (ICD-719.41) Exam does not suggest arthritis but is c/w mild impingement syndrome/rotator cuff tear.  Plan - for continued or increased pain will refer to orthopedist.  His updated medication list for this problem includes:    Bl Aspirin 325 Mg Tabs (Aspirin) .[name]... Take 1 by mouth qd  Problem # 4:  JOINT EFFUSION, LEFT KNEE (ICD-719.06) By his description he has post-traumatic arthritis of the left knee with intermittent popilteal effusion which resolves spontaneously.  Plan - no studies or intervention at this time.  Complete Medication List: 1)  Bl Aspirin 325 Mg Tabs (Aspirin) .... Take 1 by mouth qd 2)  Klor-con 8 Meq Tbcr (Potassium chloride) .... Take 1 by mouth qd 3)  Folic Acid 1 Mg Tabs (Folic acid) .[name].. 1 once daily 4)  Zocor 20 Mg Tabs (Simvastatin) .... Take 1 by mouth qd 5)  Toprol Xl 50 Mg Tb24 (Metoprolol succinate) .... Take 1 tablet by mouth once a day 6)  Nitroglycerin 0.4 Mg Subl (Nitroglycerin) .[name].. 1 tab sublingual every 44min as needed for chest pain 7)  Gemfibrozil 600 Mg Tabs (Gemfibrozil) .[name].. 1 two times a day 8)  Colcrys 0.6 Mg Tabs (Colchicine) .[name].. 1 daily for gout prevention 9)  Vitamin D 1.25  .... Take 1 tablet by mouth once a month 10)  Amlodipine Besylate 10 Mg Tabs (Amlodipine besylate) .... Take 1 tablet by mouth once a day 11)  Clotrimazole-betamethasone 1-0.05 % Crea (Clotrimazole-betamethasone) .[name].. 1pply two times a day to rash as needed 12)  Trazodone Hcl 50 Mg Tabs (Trazodone hcl) .... Take 1 tab by mouth at bedtime Prescriptions: ZOCOR 20 MG  TABS (SIMVASTATIN) TAKE 1 by mouth QD  #90 x 3   Entered by:   Charlsie Quest, CMA    Authorized by:   Neena Rhymes MD   Signed by:   Charlsie Quest, CMA on 10/14/2009   Method used:   Faxed to ...       [facility] [place]  [address]       Ph: [phone]       Fax: [phone]   RxID:   [id number] FOLIC ACID 1 MG  TABS (FOLIC ACID) 1 once daily  #90 x 3   Entered by:   Charlsie Quest, CMA   Authorized by:   Neena Rhymes MD   Signed by:   Charlsie Quest, CMA on 10/14/2009   Method used:   Faxed to ...       [facility])       [place]  [address]       Ph: [phone]       Fax: [phone]   RxID:   [id number] KLOR-CON 8 MEQ  TBCR (POTASSIUM CHLORIDE) TAKE 1 by mouth QD  #90 x 3   Entered by:   Charlsie Quest, CMA   Authorized by:   Neena Rhymes MD   Signed by:   Charlsie Quest, CMA on 10/14/2009   Method used:   Faxed to ...       [facility])       [place]  [address]       Ph: [phone]       Fax: [phone]   RxID:   [id number] ZOCOR 20 MG  TABS (SIMVASTATIN) TAKE 1 by mouth QD  #90 x 3   Entered and Authorized by:   Neena Rhymes MD   Signed by:   Neena Rhymes MD on 10/14/2009   Method used:   Print then Give to Patient   RxID:   [id number] KLOR-CON 8 MEQ  TBCR (POTASSIUM CHLORIDE) TAKE 1 by mouth QD  #90 x 3   Entered and Authorized by:   Neena Rhymes MD   Signed by:   Neena Rhymes MD on 10/14/2009   Method  used:   Print then Give to Patient   RxID:   [id number] GEMFIBROZIL 600 MG  TABS (GEMFIBROZIL) 1 two times a day  #180 x 3   Entered and Authorized by:   Neena Rhymes MD   Signed by:   Neena Rhymes MD on 10/14/2009   Method used:   Print then Give to Patient   RxID:   [id number] FOLIC ACID 1 MG  TABS (FOLIC ACID) 1 once daily  #90 x 3   Entered and Authorized by:   Neena Rhymes MD   Signed by:   Neena Rhymes MD on 10/14/2009   Method used:   Print  then Give to Patient   RxID:   [id number] AMLODIPINE BESYLATE 10 MG TABS (AMLODIPINE BESYLATE) Take 1 tablet by mouth once a day  #90 x 3   Entered and Authorized by:   Neena Rhymes MD   Signed by:   Neena Rhymes MD on 10/14/2009   Method used:   Print then Give to Patient   RxID:   [id number]

## 2010-02-09 NOTE — Assessment & Plan Note (Signed)
Summary: B12 / MEN /NWS---coming at 1:30pm/cd  Nurse Visit   Vital Signs:  Patient profile:   77 year old male Height:      72 inches  Vitals Entered By: Charlynne Cousins CMA (July 21, 2009 1:34 PM)  Allergies: 1)  Codeine 2)  * Ecotrin 3)  Hydrocodone 4)  * Trimox 5)  * Vibromycin 6)  Macrobid 7)  Cipro 8)  * Hydroxytine 9)  * Atarax 10)  Advicor (Niacin-Lovastatin) 11)  Niacin  Medication Administration  Injection # 1:    Medication: Vit B12 1000 mcg    Diagnosis: INSOMNIA (ICD-780.52)    Route: IM    Site: L deltoid    Exp Date: 04/2011    Lot #: [id number]    Mfr: [employer] Regent    Patient tolerated injection without complications    Given by: Ami Bullins CMA (July 21, 2009 1:36 PM)  Orders Added: 1)  Admin of Therapeutic Inj  intramuscular or subcutaneous [[id number]] 2)  Vit B12 1000 mcg [id number]

## 2010-02-09 NOTE — Assessment & Plan Note (Signed)
Summary: consult for sleep maintenance insomnia   Visit Type:  Initial Consult Copy to:  Adella Hare MD Primary Provider/Referring Provider:  Adella Hare MD  CC:  Sleep Consult. pt c/o not being to sleep, wakes up mutliple times a night, and snores when lies on back.  History of Present Illness: The pt is an 77y/o male who I have been asked to see for sleep maintenance issues.  He states that he as had difficulty with this for at least a few years, and has been evaluated in 2003 for osa with a sleep study that showed no significant sleep disordered breathing.  He will typically go to bed around 11pm, and has no issue falling asleep.  He will then awaken 1-5 times a night to go to bathroom (since his prostate cancer), and sometimes can get back to sleep easily and others not at all.  60% of the time he will get back to sleep within 55min.  When he can't get back to sleep, he feels that his mind is "racing".  He denies MSK pain being an issue, but does have right shoulder issues.  When he is unable to go back to sleep, he will either stay in bed or go into family room to read.  He does not get on a computer during the night.  He denies any significant sleepiness during the day, and is not a habitual napper.  He has noticed that if he sleeps away from home, he does better.  Regarding his breathing during sleep, his wife notes that he can snore at times on his back, but is not a consistent snorer.  She has noticed an abnormal breathing pattern during sleep at times that sounds very similar to C-S respirations??  The pt states that his weight has been neutral over the past 2 years, and his epworth score today is only 1.   Current Medications (verified): 1)  Bl Aspirin 325 Mg  Tabs (Aspirin) .... Take 1 By Mouth Qd 2)  Klor-Con 8 Meq  Tbcr (Potassium Chloride) .... Take 1 By Mouth Qd 3)  Folic Acid 1 Mg  Tabs (Folic Acid) .[name].. 1 Once Daily 4)  Zocor 20 Mg  Tabs (Simvastatin) .... Take 1 By Mouth  Qd 5)  Toprol Xl 50 Mg  Tb24 (Metoprolol Succinate) .... Take 1 Tablet By Mouth Once A Day 6)  Nitroglycerin 0.4 Mg  Subl (Nitroglycerin) .[name].. 1 Tab Sublingual Every 27min As Needed For Chest Pain 7)  Gemfibrozil 600 Mg  Tabs (Gemfibrozil) .[name].. 1 Two Times A Day 8)  Colcrys 0.6 Mg Tabs (Colchicine) .[name].. 1 Daily For Gout Prevention 9)  Vitamin D 1.25 .... Take 1 Tablet By Mouth Once A Month 10)  Amlodipine Besylate 10 Mg Tabs (Amlodipine Besylate) .... Take 1 Tablet By Mouth Once A Day 11)  Clotrimazole-Betamethasone 1-0.05 % Crea (Clotrimazole-Betamethasone) .[name].. 1pply Two Times A Day To Rash As Needed 12)  Trazodone Hcl 50 Mg Tabs (Trazodone Hcl) .... Take 1 Tab By Mouth At Bedtime  Allergies (verified): 1)  Codeine 2)  * Ecotrin 3)  Hydrocodone 4)  * Trimox 5)  * Vibromycin 6)  Macrobid 7)  Cipro 8)  * Hydroxytine 9)  * Atarax 10)  Advicor (Niacin-Lovastatin) 11)  Niacin  Past History:  Past Medical History: Reviewed history from 09/21/2008 and no changes required. Prostate cancer, hx of Hypertension Coronary artery disease Dyspenia Renal insufficiency Exertional angina,resolved Dyslipideama C3-C4 and C4-C5 foraminal narrowing, severe negative MRI/MRA of the head  Past Surgical History: S/P PTCA Cardia Catherization (07/07/1999) Peripheral Vascular Catherization (11/24/2003) Renal Circulation (10/01/2003) Stress Cardiolite (05/04/2005),(Spring '09-negative except for apical thinning, EF 68%) EDG (07/17/1994) FLEXIBLE sIGMOIDOSCOPY (11/03/1997) EKG (04/20/2005) Prostatectomy Lumbar spinal disk and neck fusion surgery stents--2  Family History: Reviewed history from 07/02/2007 and no changes required. father-deceased 80: CAD/MI mother-deceased 34: exsanuinated Neg- colon cancer; DM Brother - bladder cancer  Social History: Reviewed history from 07/02/2007 and no changes required. 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 End of life care: yes CPR, no long term mechanical ventilation, no heroic measures. never smoker  Review of Systems  The patient denies shortness of breath with activity, shortness of breath at rest, productive cough, non-productive cough, coughing up blood, chest pain, irregular heartbeats, acid heartburn, indigestion, loss of appetite, weight change, abdominal pain, difficulty swallowing, sore throat, tooth/dental problems, headaches, nasal congestion/difficulty breathing through nose, sneezing, itching, ear ache, anxiety, depression, hand/feet swelling, joint stiffness or pain, rash, change in color of mucus, and fever.    Vital Signs:  Patient profile:   77 year old male Height:      72 inches Weight:      176.13 pounds BMI:     23.97 O2 Sat:      97 % on Room air Temp:     98.2 degrees F oral Pulse rate:   59 / minute BP sitting:   108 / 60  (left arm) Cuff size:   regular  Vitals Entered By: Charma Igo (November 09, 2009 10:52 AM)  O2 Flow:  Room air CC: Sleep Consult. pt c/o not being to sleep, wakes up mutliple times a night, snores when lies on back Comments meds and allergies updated Phone number updated Charma Igo  November 09, 2009 10:52 AM    Physical Exam  General:  wd male in nad Eyes:  PERRLA and EOMI.   Nose:  patent without discharge,no purulence noted.   Mouth:  clear, no exudates or anatomical narrowing. Neck:  no jvd, tmg, LN Lungs:  clear to auscultation Heart:  rrr, no mrg Abdomen:  soft and nontender, bs+ Extremities:  no edema or cyanosis  pulses intact distally Neurologic:  alert and oriented, moves all 4. does not appear sleepy   Impression & Recommendations:  Problem # 1:  PERSISTENT DISORDER INITIATING/MAINTAINING SLEEP (ICD-307.42) the pt is  describing sleep maintenance issues that may be multifactorial.  He clearly has nocturia that is probably related to his prostate issues, though anything that causes awakenings can lead to the perception of frequent urination at night.  It is unclear how much MSK discomfort or the natural worsening of sleep with age may be contributing to his problem.  The fact that he sleeps better away from home indicates either some environmental/physical issue with his bedroom or can be indicative of psychophysiologic insomnia.  It is unclear whether he has sleep disordered breathing or not, but my suspicion is on the lower end.  At this point, would like to start with a trial of behavioral therapy and stressing good sleep hygiene.  I would like to avoid more aggressive use ofsedative hypnotics if possible, and have stressed to the pt these rarely solve sleep maintenance issues.  He is to call me  with how things are going.    Other Orders: Consultation Level V [phone])  Patient Instructions: 1)  do not stay in bed more than 67min if you are not sleeping. Go to family room as we discussed until you get sleepy again.  Do this as many times as it takes until you fall asleep, or the morning arrives. 2)  no computer after 10pm at night.   3)  consider trying to exercise about 4 hours before bedtime. 4)  please give me a call in 3 weeks with your progress.

## 2010-02-09 NOTE — Progress Notes (Signed)
Summary: ASPRIN RECALL  Phone Note Call from Patient Call back at Home Phone [phone]   Caller: Patient Call For: Neena Rhymes MD Summary of Call: Pt has heard on tv/radio that Bufferin has been recalled, pt wants to know what Dr Linda Hedges advises him to take? Initial call taken by: Denice Paradise,  January 18, 2010 12:33 PM  Follow-up for Phone Call        Bufferin recalled. Is nothing sacred. Any brand of enteric coated aspirin 325mg  will do.  Follow-up by: Neena Rhymes MD,  January 18, 2010 1:03 PM  Additional Follow-up for Phone Call Additional follow up Details #1::        Left detailed vm on pt's hm # Additional Follow-up by: Charlsie Quest, CMA,  January 18, 2010 2:00 PM

## 2010-02-09 NOTE — Assessment & Plan Note (Signed)
Summary: b-12 Georgette Dover Bonney Leitz  Nurse Visit   Allergies: 1)  Codeine 2)  * Ecotrin 3)  Hydrocodone 4)  * Trimox 5)  * Vibromycin 6)  Macrobid 7)  Cipro 8)  * Hydroxytine 9)  * Atarax 10)  Advicor (Niacin-Lovastatin) 11)  Niacin  Medication Administration  Injection # 1:    Medication: Vit B12 1000 mcg    Diagnosis: INSOMNIA (ICD-780.52)    Route: IM    Site: L deltoid    Exp Date: 01/2011    Lot #: [id number]    Mfr: [place]    Patient tolerated injection without complications    Given by: Ami Bullins CMA (June 23, 2009 2:02 PM)  Orders Added: 1)  Admin of Therapeutic Inj  intramuscular or subcutaneous [96372] 2)  Vit B12 1000 mcg [id number]

## 2010-02-09 NOTE — Assessment & Plan Note (Signed)
Summary: B12 / MEN /NWS  Nurse Visit   Vital Signs:  Patient profile:   77 year old male Height:      72 inches  Vitals Entered By: Charlynne Cousins CMA (October 24, 2009 1:24 PM)  Allergies: 1)  Codeine 2)  * Ecotrin 3)  Hydrocodone 4)  * Trimox 5)  * Vibromycin 6)  Macrobid 7)  Cipro 8)  * Hydroxytine 9)  * Atarax 10)  Advicor (Niacin-Lovastatin) 11)  Niacin  Medication Administration  Injection # 1:    Medication: Vit B12 1000 mcg    Diagnosis: B12 DEFICIENCY (ICD-266.2)    Route: IM    Site: L deltoid    Exp Date: 07/2011    Lot #: [id number]    Mfr: [employer]    Patient tolerated injection without complications    Given by: Ami Bullins CMA (October 24, 2009 1:24 PM)  Orders Added: 1)  Admin of Therapeutic Inj  intramuscular or subcutaneous [96372] 2)  Vit B12 1000 mcg [id number]

## 2010-02-09 NOTE — Progress Notes (Signed)
Summary: Office Visit/ BLOOD PRESSURE READINGS  Office Visit/ BLOOD PRESSURE READINGS   Imported By: Bartholomew Boards 09/30/2009 16:41:23  _____________________________________________________________________  External Attachment:    Type:   Image     Comment:   External Document

## 2010-02-09 NOTE — Letter (Signed)
Summary: [facility]   Imported By: Phillis Knack 03/28/2009 08:20:01  _____________________________________________________________________  External Attachment:    Type:   Image     Comment:   External Document

## 2010-02-09 NOTE — Assessment & Plan Note (Signed)
Summary: CHILLS/ CONGESTION/NWS   Vital Signs:  Patient profile:   77 year old male Height:      72 inches Weight:      175 pounds BMI:     23.82 O2 Sat:      96 % on Room air Temp:     98.8 degrees F oral Pulse rate:   75 / minute BP sitting:   118 / 62  (left arm) Cuff size:   regular  Vitals Entered By: Charlynne Cousins CMA (January 10, 2010 11:36 AM)  O2 Flow:  Room air CC: pt here with c/o chills,bodyaches,fatigue,cough and headache with fever x 3 days/ ab   Primary Care Provider:  Adella Hare MD  CC:  pt here with c/o chills, bodyaches, fatigue, and cough and headache with fever x 3 days/ ab.  History of Present Illness: [name] present for a 4 day h/o fevers to 102, rigors and sweat. On-set was Saturday with high fever and rigors. He has had weakness/fatigue, poor appetite with reduced intake of food and fluids, difuse myalgias. He has had headache, cough that is productive of clear sputum. Denies SOB, palpitations or chest pain, no frank nausea or vomiting, no sore throat, no enlarged lymphnodes. He has had flu vaccine this season.   Current Medications (verified): 1)  Bl Aspirin 325 Mg  Tabs (Aspirin) .... Take 1 By Mouth Qd 2)  Klor-Con 8 Meq  Tbcr (Potassium Chloride) .... Take 1 By Mouth Qd 3)  Folic Acid 1 Mg  Tabs (Folic Acid) .[name].. 1 Once Daily 4)  Zocor 20 Mg  Tabs (Simvastatin) .... Take 1 By Mouth Qd 5)  Toprol Xl 50 Mg  Tb24 (Metoprolol Succinate) .... Take 1 Tablet By Mouth Once A Day 6)  Nitroglycerin 0.4 Mg  Subl (Nitroglycerin) .[name].. 1 Tab Sublingual Every 35min As Needed For Chest Pain 7)  Gemfibrozil 600 Mg  Tabs (Gemfibrozil) .[name].. 1 Two Times A Day 8)  Colcrys 0.6 Mg Tabs (Colchicine) .[name].. 1 Daily For Gout Prevention 9)  Vitamin D 1.25 .... Take 1 Tablet By Mouth Once A Month 10)  Amlodipine Besylate 10 Mg Tabs (Amlodipine Besylate) .... Take 1 Tablet By Mouth Once A Day 11)  Clotrimazole-Betamethasone 1-0.05 % Crea (Clotrimazole-Betamethasone) .[name]..  1pply Two Times A Day To Rash As Needed 12)  Trazodone Hcl 50 Mg Tabs (Trazodone Hcl) .... Take 1 Tab By Mouth At Bedtime 13)  Benzonatate 100 Mg Caps (Benzonatate) .[name].. 1 By Mouth Three Times A Day For Scratchy Cough. 14)  Nexium 40 Mg Cpdr (Esomeprazole Magnesium) .[name].. 1 By Mouth Qam  Allergies (verified): 1)  Codeine 2)  * Ecotrin 3)  Hydrocodone 4)  * Trimox 5)  * Vibromycin 6)  Macrobid 7)  Cipro 8)  * Hydroxytine 9)  * Atarax 10)  Advicor (Niacin-Lovastatin) 11)  Niacin  Past History:  Past Medical History: Last updated: 09/21/2008 Prostate cancer, hx of Hypertension Coronary artery disease Dyspenia Renal insufficiency Exertional angina,resolved Dyslipideama C3-C4 and C4-C5 foraminal narrowing, severe negative MRI/MRA of the head  Past Surgical History: Last updated: 11/09/2009 S/P PTCA Cardia Catherization (07/07/1999) Peripheral Vascular Catherization (11/24/2003) Renal Circulation (10/01/2003) Stress Cardiolite (05/04/2005),(Spring '09-negative except for apical thinning, EF 68%) EDG (07/17/1994) FLEXIBLE sIGMOIDOSCOPY (11/03/1997) EKG (04/20/2005) Prostatectomy Lumbar spinal disk and neck fusion surgery stents--2 FH reviewed for relevance, SH/Risk Factors reviewed for relevance  Review of Systems       The patient complains of anorexia, fever, weight loss, prolonged cough, and headaches.  The patient denies vision loss, chest pain, syncope, dyspnea on exertion, abdominal pain, severe indigestion/heartburn, hematuria, muscle weakness, abnormal bleeding, and enlarged lymph nodes.    Physical Exam  General:  alert, well-developed, well-nourished, and well-hydrated.   Head:  normocephalic and atraumatic.   Eyes:  pupils equal and pupils round.  C&S clear Ears:  External ear exam shows no significant lesions or deformities.  Otoscopic examination reveals clear canals, tympanic membranes are intact bilaterally without bulging, retraction, inflammation or  discharge. Hearing is grossly normal bilaterally. Mouth:  throat clear Neck:  supple and full ROM.   Lungs:  normal respiratory effort, normal breath sounds, no crackles, and no wheezes.   Heart:  normal rate and regular rhythm.   Abdomen:  soft, non-tender, and normal bowel sounds.   Msk:  no joint tenderness, no joint swelling, no joint warmth, and no joint deformities.   Pulses:  2+ radial pulse Neurologic:  alert & oriented X3, cranial nerves II-XII intact, strength normal in all extremities, sensation intact to pinprick, gait normal, and DTRs symmetrical and normal.     Impression & Recommendations:  Problem # 1:  INFLUENZA LIKE ILLNESS (ICD-487.1) Patient with a constillation of symptoms that is very suggestive of flu. He is advised that even having had a flu shot he can get the flu - just a less serious case. He has no respiratory distress.  Plan - tamiflu 75mg  two times a day x 5          self-quarantine until afebril for 48 hrs.          spouse - ardelle is given prophylaxis with tamiflu 75mg  once daily x 7  Complete Medication List: 1)  Bl Aspirin 325 Mg Tabs (Aspirin) .... Take 1 by mouth qd 2)  Klor-con 8 Meq Tbcr (Potassium chloride) .... Take 1 by mouth qd 3)  Folic Acid 1 Mg Tabs (Folic acid) .[name].. 1 once daily 4)  Zocor 20 Mg Tabs (Simvastatin) .... Take 1 by mouth qd 5)  Toprol Xl 50 Mg Tb24 (Metoprolol succinate) .... Take 1 tablet by mouth once a day 6)  Nitroglycerin 0.4 Mg Subl (Nitroglycerin) .[name].. 1 tab sublingual every 27min as needed for chest pain 7)  Gemfibrozil 600 Mg Tabs (Gemfibrozil) .[name].. 1 two times a day 8)  Colcrys 0.6 Mg Tabs (Colchicine) .[name].. 1 daily for gout prevention 9)  Vitamin D 1.25  .... Take 1 tablet by mouth once a month 10)  Amlodipine Besylate 10 Mg Tabs (Amlodipine besylate) .... Take 1 tablet by mouth once a day 11)  Clotrimazole-betamethasone 1-0.05 % Crea (Clotrimazole-betamethasone) .[name].. 1pply two times a day to rash as needed 12)   Trazodone Hcl 50 Mg Tabs (Trazodone hcl) .... Take 1 tab by mouth at bedtime 13)  Benzonatate 100 Mg Caps (Benzonatate) .[name].. 1 by mouth three times a day for scratchy cough. 14)  Nexium 40 Mg Cpdr (Esomeprazole magnesium) .[name].. 1 by mouth qam 15)  Tamiflu 75 Mg Caps (Oseltamivir phosphate) .[name].. 1 two times a day x 5 Prescriptions: AMLODIPINE BESYLATE 10 MG TABS (AMLODIPINE BESYLATE) Take 1 tablet by mouth once a day  #90 x 3   Entered and Authorized by:   Neena Rhymes MD   Signed by:   Neena Rhymes MD on 01/10/2010   Method used:   Electronically to        [facility] (mail-order)       [address] [place]  [address]       Ph: [phone]       Fax: [phone]   RxID:   [id number] GEMFIBROZIL 600 MG  TABS (GEMFIBROZIL) 1 two times a day  #180 x 3   Entered and Authorized by:   Neena Rhymes MD   Signed by:   Neena Rhymes MD on 01/10/2010   Method used:   Electronically to        [facility]* ([place]  [address]       Ph: [phone]       Fax: [phone]   [id number] TAMIFLU 75 MG CAPS (OSELTAMIVIR PHOSPHATE) 1 two times a day x 5  #10 x 0   Entered and Authorized by:   Neena Rhymes MD   Signed by:   Neena Rhymes MD on 01/10/2010   Method used:   Electronically to        [facility]. [phone]* (retail)       [address]       [place], [place]  [address]       Ph: [phone]       Fax: [phone]   RxID:   [id number]    Orders Added: 1)  Est. Patient Level III [id number]

## 2010-02-09 NOTE — Assessment & Plan Note (Signed)
Summary: 6 mo fu per sept reminder   Visit Type:  Follow-up Primary Provider:  Norins   History of Present Illness: the patient is an 77 year old male with history  of coronary artery diseasestatus post single-vessel coronary artery stenting.he denies any chest pain shortness of breath orthopnea PND.  He remains very active.  His biggest problems insomnia.  From a cardiovascular standpoint is stable is not anemic although his MCV is 76.  He has known renal insufficiency with stable creatinine of around 2 mg percent.  Preventive Screening-Counseling & Management  Alcohol-Tobacco     Smoking Status: never  Current Medications (verified): 1)  Bl Aspirin 325 Mg  Tabs (Aspirin) .... Take 1 By Mouth Qd 2)  Klor-Con 8 Meq  Tbcr (Potassium Chloride) .... Take 1 By Mouth Qd 3)  Folic Acid 1 Mg  Tabs (Folic Acid) .[name].. 1 Once Daily 4)  Zocor 20 Mg  Tabs (Simvastatin) .... Take 1 By Mouth Qd 5)  Toprol Xl 50 Mg  Tb24 (Metoprolol Succinate) .... Take 1/2 Tablet By Mouth Once A Day 6)  Nitroglycerin 0.4 Mg  Subl (Nitroglycerin) .[name].. 1 Tab Sublingual Every 29min As Needed For Chest Pain 7)  Gemfibrozil 600 Mg  Tabs (Gemfibrozil) .[name].. 1 Two Times A Day 8)  Colchicine 0.6 Mg Tabs (Colchicine) .[name].. 1 Once Daily Gout Prevention 9)  Vitamin D 1.25 .... Take 1 Tablet By Mouth Once A Month 10)  Amlodipine Besylate 10 Mg Tabs (Amlodipine Besylate) .... Take 1 Tablet By Mouth Once A Day 11)  Clotrimazole-Betamethasone 1-0.05 % Crea (Clotrimazole-Betamethasone) .[name].. 1pply Two Times A Day To Rash As Needed 12)  Trazodone Hcl 50 Mg Tabs (Trazodone Hcl) .... Take 1 Tab By Mouth At Bedtime  Allergies (verified): 1)  Codeine 2)  * Ecotrin 3)  Hydrocodone 4)  * Trimox 5)  * Vibromycin 6)  Macrobid 7)  Cipro 8)  * Hydroxytine 9)  * Atarax 10)  Advicor (Niacin-Lovastatin) 11)  Niacin  Comments:  Nurse/Medical Assistant: The patient's medication list and allergies were reviewed with the patient and were  updated in the Medication and Allergy Lists.  Past History:  Past Medical History: Last updated: 09/21/2008 Prostate cancer, hx of Hypertension Coronary artery disease Dyspenia Renal insufficiency Exertional angina,resolved Dyslipideama C3-C4 and C4-C5 foraminal narrowing, severe negative MRI/MRA of the head  Past Surgical History: Last updated: 07/19/2008 S/P PTCA Cardia Catherization (07/07/1999) Peripheral Vascular Catherization (11/24/2003) Renal Circulation (10/01/2003) Stress Cardiolite (05/04/2005),(Spring '09-negative except for apical thinning, EF 68%) EDG (07/17/1994) FLEXIBLE sIGMOIDOSCOPY (11/03/1997) EKG (04/20/2005) Prostatectomy Lumbar spinal disk and neck fusion surgery  Family History: Last updated: 08-01-2007 father-deceased 05/03/2058: CAD/MI mother-deceased 32: exsanuinated Neg- colon cancer; DM Brother - prostate cancer  Social History: Last updated: 01-Aug-2007 HSG, 1 year college married 05/03/2050 - 80 years, divorced; married May 03, 2054 - 3 years divorced; married '63-12 yrs - divorced; married 02-May-1973 - 1 son May 03, 2055; 1 daughter - 2051/05/03; 1 grandchild 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 End of life care: yes CPR, no long term mechanical ventilation, no heroic measures.  Risk Factors: Smoking Status: never (09/21/2009)  Review of Systems  The patient denies fatigue, malaise, fever, weight gain/loss, vision loss, decreased hearing, hoarseness, chest pain, palpitations, shortness of breath, prolonged cough, wheezing, sleep apnea, coughing up blood, abdominal pain, blood in stool, nausea, vomiting, diarrhea, heartburn, incontinence, blood in urine, muscle weakness, joint pain, leg swelling, rash, skin lesions, headache, fainting, dizziness, depression, anxiety, enlarged lymph  nodes, easy bruising or bleeding, and environmental allergies.    Vital Signs:  Patient profile:   77 year old male Height:      72  inches Weight:      176 pounds Pulse rate:   61 / minute BP sitting:   107 / 63  (left arm) Cuff size:   regular  Vitals Entered By: Georgina Peer (September 21, 2009 1:17 PM)  Physical Exam  Additional Exam:  General: Well-developed, well-nourished in no distress head: Normocephalic and atraumatic eyes PERRLA/EOMI intact, conjunctiva and lids normal nose: No deformity or lesions mouth normal dentition, normal posterior pharynx neck: Supple, no JVD.  No masses, thyromegaly or abnormal cervical nodes lungs: Normal breath sounds bilaterally without wheezing.  Normal percussion heart: regular rate and rhythm with normal S1 and S2, no S3 or S4.  PMI is normal.  No pathological murmurs abdomen: Normal bowel sounds, abdomen is soft and nontender without masses, organomegaly or hernias noted.  No hepatosplenomegaly musculoskeletal: Back normal, normal gait muscle strength and tone normal pulsus: Pulse is normal in all 4 extremities Extremities: No peripheral pitting edema neurologic: Alert and oriented x 3 skin: Intact without lesions or rashes cervical nodes: No significant adenopathy psychologic: Normal affect    EKG  Procedure date:  09/21/2009  Findings:      normal sinus rhythm first degree AV block.  Heart rate 60 beats/min  Impression & Recommendations:  Problem # 1:  INSOMNIA (ICD-780.52) again the patient prescription of trazodone 50 minutes p.o. nightly.  If significant rumination I am concerned he could have some element of depression.  Can further discuss this with his primary care physician.  I did instruct the patient about the side effects of trazodone.  Problem # 2:  CORONARY ARTERY DISEASE (ICD-414.00) stable no recurrent chest pain.  EKG today is within normal limits. His updated medication list for this problem includes:    Bl Aspirin 325 Mg Tabs (Aspirin) .[name]... Take 1 by mouth qd    Toprol Xl 50 Mg Tb24 (Metoprolol succinate) .[name]... Take 1/2 tablet by mouth  once a day    Nitroglycerin 0.4 Mg Subl (Nitroglycerin) .[name]... 1 tab sublingual every 62min as needed for chest pain    Amlodipine Besylate 10 Mg Tabs (Amlodipine besylate) .[name]... Take 1 tablet by mouth once a day  Orders: EKG w/ Interpretation (93000)  Problem # 3:  RENAL INSUFFICIENCY (ICD-588.9) stable creatinine followed by his nephrologist  Patient Instructions: 1)  Trazodone 50mg  at bedtime  2)  Follow up in  6 months  Prescriptions: TRAZODONE HCL 50 MG TABS (TRAZODONE HCL) Take 1 tab by mouth at bedtime  #30 x 3   Entered by:   Lovina Reach, LPN   Authorized by:   Terald Sleeper, MD, [facility]   Signed by:   Lovina Reach, LPN on [id number]   Method used:   Electronically to        [facility]. [phone]* (retail)       [address]       [place], [place]  [address]       Ph: [phone]       Fax: [phone]   RxIDQW:[id number]   Handout requested.

## 2010-02-09 NOTE — Assessment & Plan Note (Signed)
Summary: COLD IS NOT BETTER/NWS-B12 ALSO--STC   Vital Signs:  Patient profile:   77 year old male Height:      72 inches Weight:      177 pounds BMI:     24.09 O2 Sat:      98 % on Room air Temp:     98.5 degrees F oral Pulse rate:   62 / minute BP sitting:   116 / 68  (left arm) Cuff size:   regular  Vitals Entered By: Charlynne Cousins CMA (December 22, 2009 1:50 PM)  O2 Flow:  Room air CC: pt here with c/o ongoing sore throat x 3 weeks/ ab   Primary Care Provider:  Adella Hare MD  CC:  pt here with c/o ongoing sore throat x 3 weeks/ ab.  History of Present Illness: Patient returns due to a persistent cough: reports that is seems to be in the throat not the lungs, non-productive, no F/S/C,  no SOB, no wheezing. ONly minimal relief with benzonatate. No new medications. no h/o allergies.  Current Medications (verified): 1)  Bl Aspirin 325 Mg  Tabs (Aspirin) .... Take 1 By Mouth Qd 2)  Klor-Con 8 Meq  Tbcr (Potassium Chloride) .... Take 1 By Mouth Qd 3)  Folic Acid 1 Mg  Tabs (Folic Acid) .[name].. 1 Once Daily 4)  Zocor 20 Mg  Tabs (Simvastatin) .... Take 1 By Mouth Qd 5)  Toprol Xl 50 Mg  Tb24 (Metoprolol Succinate) .... Take 1 Tablet By Mouth Once A Day 6)  Nitroglycerin 0.4 Mg  Subl (Nitroglycerin) .[name].. 1 Tab Sublingual Every 35min As Needed For Chest Pain 7)  Gemfibrozil 600 Mg  Tabs (Gemfibrozil) .[name].. 1 Two Times A Day 8)  Colcrys 0.6 Mg Tabs (Colchicine) .[name].. 1 Daily For Gout Prevention 9)  Vitamin D 1.25 .... Take 1 Tablet By Mouth Once A Month 10)  Amlodipine Besylate 10 Mg Tabs (Amlodipine Besylate) .... Take 1 Tablet By Mouth Once A Day 11)  Clotrimazole-Betamethasone 1-0.05 % Crea (Clotrimazole-Betamethasone) .[name].. 1pply Two Times A Day To Rash As Needed 12)  Trazodone Hcl 50 Mg Tabs (Trazodone Hcl) .... Take 1 Tab By Mouth At Bedtime 13)  Benzonatate 100 Mg Caps (Benzonatate) .[name].. 1 By Mouth Three Times A Day For Scratchy Cough.  Allergies (verified): 1)  Codeine 2)  *  Ecotrin 3)  Hydrocodone 4)  * Trimox 5)  * Vibromycin 6)  Macrobid 7)  Cipro 8)  * Hydroxytine 9)  * Atarax 10)  Advicor (Niacin-Lovastatin) 11)  Niacin  Past History:  Past Medical History: Last updated: 09/21/2008 Prostate cancer, hx of Hypertension Coronary artery disease Dyspenia Renal insufficiency Exertional angina,resolved Dyslipideama C3-C4 and C4-C5 foraminal narrowing, severe negative MRI/MRA of the head  Past Surgical History: Last updated: 11/09/2009 S/P PTCA Cardia Catherization (07/07/1999) Peripheral Vascular Catherization (11/24/2003) Renal Circulation (10/01/2003) Stress Cardiolite (05/04/2005),(Spring '09-negative except for apical thinning, EF 68%) EDG (07/17/1994) FLEXIBLE sIGMOIDOSCOPY (11/03/1997) EKG (04/20/2005) Prostatectomy Lumbar spinal disk and neck fusion surgery stents--2 FH reviewed for relevance, SH/Risk Factors reviewed for relevance  Review of Systems       The patient complains of hoarseness, prolonged cough, and incontinence.  The patient denies anorexia, fever, weight loss, weight gain, decreased hearing, chest pain, syncope, dyspnea on exertion, headaches, abdominal pain, muscle weakness, difficulty walking, depression, and enlarged lymph nodes.    Physical Exam  General:  Well-developed,well-nourished,in no acute distress; alert,appropriate and cooperative throughout examination Head:  normocephalic and atraumatic.   Neck:  supple.  Lungs:  normal respiratory effort, no intercostal retractions, no accessory muscle use, normal breath sounds, no crackles, and no wheezes.   Heart:  normal rate and regular rhythm.   Abdomen:  soft and non-tender.   Neurologic:  alert & oriented X3 and gait normal.   Skin:  turgor normal and color normal.   Cervical Nodes:  no anterior cervical adenopathy and no posterior cervical adenopathy.   Psych:  normally interactive and good eye contact.     Impression & Recommendations:  Problem # 1:   COUGH (L2106332.2) Persistent cough with no evidence of infection. Suspect reflux and tracheal irritation  Plan - trial of PPI - nexium 40mg  once daily          if cough persists - referral to pulmonary  Problem # 2:  PROSTATE CANCER, HX OF (ICD-V10.46) patient with persistent incontinence.  Plan - inquire of Dr. Risa Grill about mechanical sphincter device  Complete Medication List: 1)  Bl Aspirin 325 Mg Tabs (Aspirin) .... Take 1 by mouth qd 2)  Klor-con 8 Meq Tbcr (Potassium chloride) .... Take 1 by mouth qd 3)  Folic Acid 1 Mg Tabs (Folic acid) .[name].. 1 once daily 4)  Zocor 20 Mg Tabs (Simvastatin) .... Take 1 by mouth qd 5)  Toprol Xl 50 Mg Tb24 (Metoprolol succinate) .... Take 1 tablet by mouth once a day 6)  Nitroglycerin 0.4 Mg Subl (Nitroglycerin) .[name].. 1 tab sublingual every 73min as needed for chest pain 7)  Gemfibrozil 600 Mg Tabs (Gemfibrozil) .[name].. 1 two times a day 8)  Colcrys 0.6 Mg Tabs (Colchicine) .[name].. 1 daily for gout prevention 9)  Vitamin D 1.25  .... Take 1 tablet by mouth once a month 10)  Amlodipine Besylate 10 Mg Tabs (Amlodipine besylate) .... Take 1 tablet by mouth once a day 11)  Clotrimazole-betamethasone 1-0.05 % Crea (Clotrimazole-betamethasone) .[name].. 1pply two times a day to rash as needed 12)  Trazodone Hcl 50 Mg Tabs (Trazodone hcl) .... Take 1 tab by mouth at bedtime 13)  Benzonatate 100 Mg Caps (Benzonatate) .[name].. 1 by mouth three times a day for scratchy cough. 14)  Nexium 40 Mg Cpdr (Esomeprazole magnesium) .[name].. 1 by mouth qam Prescriptions: NEXIUM 40 MG CPDR (ESOMEPRAZOLE MAGNESIUM) 1 by mouth qAM  #90 x 3   Entered and Authorized by:   Neena Rhymes MD   Signed by:   Neena Rhymes MD on 12/22/2009   Method used:   Electronically to        [facility]. [phone]* (retail)       [address]       [place], [place]  [address]       Ph: [phone]       Fax: [phone]   RxID:   [id number]    Orders  Added: 1)  Est. Patient Level II [id number]    Laboratory Results   Date/Time Reported: Ami Bullins CMA  December 22, 2009 1:51 PM   Other Tests  Rapid Strep: negative

## 2010-02-09 NOTE — Letter (Signed)
Summary: [facility]   Imported By: Phillis Knack 11/04/2009 11:56:33  _____________________________________________________________________  External Attachment:    Type:   Image     Comment:   External Document

## 2010-02-20 ENCOUNTER — Encounter: Payer: Self-pay | Admitting: Internal Medicine

## 2010-02-28 ENCOUNTER — Encounter: Payer: Self-pay | Admitting: Internal Medicine

## 2010-03-01 NOTE — Letter (Signed)
[facility]   Imported By: Phillis Knack 02/24/2010 10:00:53  _____________________________________________________________________  External Attachment:    Type:   Image     Comment:   External Document

## 2010-03-21 NOTE — Letter (Signed)
Summary: [facility]   Imported By: Phillis Knack 03/14/2010 14:23:11  _____________________________________________________________________  External Attachment:    Type:   Image     Comment:   External Document

## 2010-04-10 ENCOUNTER — Ambulatory Visit (INDEPENDENT_AMBULATORY_CARE_PROVIDER_SITE_OTHER): Payer: Medicare Other | Admitting: Cardiology

## 2010-04-10 ENCOUNTER — Encounter: Payer: Self-pay | Admitting: *Deleted

## 2010-04-10 ENCOUNTER — Encounter: Payer: Self-pay | Admitting: Cardiology

## 2010-04-10 DIAGNOSIS — I2510 Atherosclerotic heart disease of native coronary artery without angina pectoris: Secondary | ICD-10-CM

## 2010-04-10 DIAGNOSIS — I10 Essential (primary) hypertension: Secondary | ICD-10-CM

## 2010-04-10 NOTE — Patient Instructions (Signed)
Continue all current medications. Your physician wants you to follow up in: 6 months.  You will receive a reminder letter in the mail one-two months in advance.  If you don't receive a letter, please call our office to schedule the follow up appointment   

## 2010-04-10 NOTE — Progress Notes (Signed)
HPI The patient is an 77 year old male with history of coronary artery disease. status post single-vessel coronary artery stenting. The patient has been doing well. He also has renal insufficiency which is followed by nephrology. Patient's original catheterization was in 2001. He had a stress Cardiolite in 2007 which was negative for ischemia ejection fraction was 68%. The patient presents for followup today. The patient is doing quite well. He denies any chest pain he reports no shortness of breath. He has no orthopnea PND. The patient does report some numbness in the lower extremities although he doesn't have diabetes it sounds like he may have a neuropathy area to have asked him to further evaluate by neurology. The patient is getting ready to go to a trip to [place]. He will be visiting the [place]. From a medical standpoint he appears to be stable. I proceeded also today with a bedside echocardiogram and this showed normal LV function, no segmental wall motion abnormalities and normal valves.  Allergies  Allergen Reactions  . Amoxicillin   . Aspirin     REACTION: upset stomach  . Ciprofloxacin   . Codeine     REACTION: Stomach upset  . Hydrocodone   . Niacin   . Nitrofurantoin     No current outpatient prescriptions on file prior to visit.    Past Medical History  Diagnosis Date  . Prostate cancer   . Hypertension   . CAD (coronary artery disease)     S/P stenting to mid RCA, prox PDA 06/1999;  06/2007 Myoview: negative except for apical thinning, EF 68%  . Renal insufficiency   . Exertional angina     Resolved  . Dyslipidemia   . Neck injury     C3-C4 and C4-C5 foraminal narrowing, severe  . Renal artery stenosis     50% by cath    Past Surgical History  Procedure Date  . Prostatectomy   . Lumbar spine surgery   . Cervical fusion     Family History  Problem Relation Age of Onset  . Coronary artery disease Father   . Cancer Brother     Bladder     History   Social History  . Marital Status: Married    Spouse Name: N/A    Number of Children: 2  . Years of Education: N/A   Occupational History  . Electronics     Retired  . 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 Smoking status: Never Smoker   . Smokeless tobacco: Not on file  . Alcohol Use: Not on file  . Drug Use: Not on file  . Sexually Active: Not on file   Other Topics Concern  . Not on file   Social History Narrative  . No narrative on file   Review of systems:Pertinent positives as outlined above. The remainder of the 18  point review of systems is negative  PHYSICAL EXAM BP 113/71  Pulse 58  Ht 6' (1.829 m)  Wt 181 lb (82.101 kg)  BMI 24.55 kg/m2  SpO2 96% General: Well-developed, well-nourished in no distress Head: Normocephalic and atraumatic Eyes:PERRLA/EOMI intact, conjunctiva and lids normal Ears: No deformity or lesions Mouth:normal dentition, normal posterior pharynx Neck: Supple, no JVD.  No masses, thyromegaly or abnormal cervical nodes Lungs: Normal breath sounds bilaterally without wheezing.  Normal percussion Cardiac: regular rate and rhythm with normal S1 and S2, no S3 or S4.  PMI is normal.  No pathological murmurs Abdomen: Normal bowel sounds, abdomen is soft and nontender without masses, organomegaly or hernias noted.  No hepatosplenomegaly MSK: Back normal, normal gait muscle strength and tone normal Vascular: Pulse is normal in all 4 extremities Extremities: No peripheral pitting edema Neurologic: Alert and oriented x 3 Skin: Intact without lesions or rashes Lymphatics: No significant adenopathy Psychologic: Normal affect   ECG:NA   ASSESSMENT AND PLAN

## 2010-04-10 NOTE — Assessment & Plan Note (Signed)
No recurrent chest pain continue medical therapy. Bedside echocardiogram was performed which showed normal LV function and no segmental wall motion abnormality. Normal valves

## 2010-04-10 NOTE — Assessment & Plan Note (Signed)
Blood pressure well controlled no change in medical therapy. 

## 2010-04-19 ENCOUNTER — Telehealth: Payer: Self-pay | Admitting: *Deleted

## 2010-04-19 NOTE — Telephone Encounter (Signed)
Routine adult immunizations: be sure tetnus is up to date, pneumonia vaccine.

## 2010-04-19 NOTE — Telephone Encounter (Signed)
Patient requesting to know what immunizations he needs for trip to [place] on 5/3?

## 2010-04-20 NOTE — Telephone Encounter (Signed)
Needs MMR, All other's are up to date. Wife informed.

## 2010-04-21 ENCOUNTER — Ambulatory Visit (INDEPENDENT_AMBULATORY_CARE_PROVIDER_SITE_OTHER): Payer: Medicare Other | Admitting: *Deleted

## 2010-04-21 DIAGNOSIS — Z23 Encounter for immunization: Secondary | ICD-10-CM

## 2010-04-21 MED ORDER — MEASLES, MUMPS & RUBELLA VAC ~~LOC~~ INJ
0.5000 mL | INJECTION | Freq: Once | SUBCUTANEOUS | Status: AC
  Administered 2010-04-21: 0.5 mL via SUBCUTANEOUS

## 2010-05-23 NOTE — Assessment & Plan Note (Signed)
[facility]                          [facility] OFFICE NOTE   NAME:[name], [name]                   MRN:          [id number]  DATE:07/14/2007                            DOB:          1927/01/06    PRIMARY CARDIOLOGIST:  Mike Mcmurray, MD, [facility]   REASON FOR VISIT:  [name] is a very pleasant 77 year old male,  well-known to [place] here in the clinic, with a history of single-vessel CAD  treated with bare-metal stenting of the PDA in June 2001.  He has normal  left ventricular function, hypertension, and mild renal artery stenosis  with chronic renal insufficiency, followed by Dr. Edrick [name] in  [place].  He was last seen here in our clinic in January 2009, by Dr.  Dannielle [name].   [name] recently had an exercise stress test, ordered by Dr.  Linda [name], for evaluation of recurrent angina pectoris.  The patient  informs me today that he had been doing extremely well since his  coronary artery stenting, until these past 2 weeks or so.  He has noted  development of exertional angina pectoris during the initial phase of  treadmill exercise, at a local facility.  He has had to hold off on his  exercise regimen as a result.  Of note, he is not experiencing any  symptoms at rest.  His chest discomfort does resolve fairly promptly  with rest.   Following consultation between Dr. Linda [name] and Dr. Dannielle [name], the patient  was recently started on low-dose Imdur at 30 mg daily.  Of note,  however, he has not returned to the gym since starting this medication.   [name] had a stress test in our [facility] office on July 07, 2007, with which he was able to exercise a full 10 minutes, achieving  88% of PMHR.  At the end of the exercise phase, he did report chest  pain, which was similar to what he had been experiencing at the gym.  There were no diagnostic ST abnormalities.  His symptoms did abate  following completion of the test.  Perfusion  imaging, reviewed by Dr.  Stanford [name], did suggest some mild ischemia in the inferior apical wall.  Calculated EF was 68%, with no focal wall motion abnormalities.   We had [name] come to our office today, as an add-on, for review  of these recent study results as well as his recent clinical history.  He has not developed any rest symptoms since the test.  Of note, he had  not taken his Imdur either the morning or the evening prior to the  scheduled procedure.   EKG in our office today indicates a sinus bradycardia at 57 bpm with LAD  and no significant changes since his previous study of last year.   CURRENT MEDICATIONS:  1. Aspirin 325 daily.  2. Hyzaar 50/12.5 daily.  3. Potassium chloride OTC one daily.  4. Toprol-XL 50 b.i.d.  5. Gemfibrozil 600 b.i.d.  6. Zocor 20 nightly.  7. Colchicine 0.6 mg daily.  8. Imdur 30 daily.  9. Magnesium 250 q.p.m.   PHYSICAL EXAMINATION:  VITAL  SIGNS:  Blood pressure 100/58, pulse 57 and  regular, weight 176.8.  GENERAL:  A 77 year old male, sitting upright, in no distress.  HEENT:  Normocephalic and atraumatic.  NECK:  Palpable bilateral carotid pulses; no JVD.  LUNGS:  Clear to auscultation in all fields.  HEART:  Regular rate and rhythm (S1S2).  No significant murmurs.  ABDOMEN:  Soft and nontender.  EXTREMITIES:  No edema.  NEURO:  No focal deficit.   IMPRESSION:  1. Exertional angina pectoris.      a.     Recent adequate exercise stress Cardiolite suggestive of       mild inferior apical ischemia; ejection fraction of 60%.      b.     Negative stress test in 2007.      c.     Status post NIR stenting of mid right coronary artery and       posterior descending artery, June 2001.  2. Preserved left ventricular ejection fraction.  3. Relative hypotension.  4. Dyslipidemia.  5. Chronic renal insufficiency.      a.     Mild right renal artery stenosis.   PLAN:  We discussed the recent stress test results in full, both with   the patient and his wife.  Given his underlying renal insufficiency, we  have elected to proceed with conservative management for the time being.  Given that he has not challenged himself with his regular exercise  program since being placed on Imdur, we have agreed that he resume his  previous regimen.  If he does not experience any recurrent chest  discomfort, then we can continue on this regimen with low-dose Imdur.  If, however, he does induce exertional chest discomfort on this dose of  Imdur, then we will either try to increase his Imdur or discuss  proceeding with a diagnostic cardiac catheterization and possible  percutaneous intervention.  He is aware that he would be at risk of  contrast-induced nephropathy, if we choose to proceed.  Therefore, we  will revisit this issue when he returns to the clinic for a previously  scheduled followup appointment with Dr. Dannielle [name] on July 31st.  He was  quite agreeable with this plan.  He also knows to contact our office at  any time if he has any worsening symptoms or concerns.  He was also  instructed  as to the use of sublingual nitroglycerin, and to not hesitate dialing  911 in the event of unrelieved chest pain.      Mannie Stabile, PA-C  Electronically Signed      Satira Sark, MD  Electronically Signed   GS/MedQ  DD: 07/14/2007  DT: 07/15/2007  Job #: [id number]   cc:   Mike Knuckles. Norins, MD

## 2010-05-23 NOTE — Assessment & Plan Note (Signed)
[place] OFFICE NOTE   NAME:[name], [name]                   MRN:          [id number]  DATE:05/28/2008                            DOB:          May 07, 1926    REASON FOR VISIT:  Routine followup.   HISTORY OF PRESENT ILLNESS:  The patient is a pleasant 77 year old male  with a history of coronary artery disease status post NIR stenting of  the mid to right coronary artery in June 2001.  A recent stress test  showed mild inferoapical ischemia, ejection fraction was 60%.  The  patient actually was admitted on August 22, 2007, with atypical chest  pain, referred to [facility].  The patient, on his last visit, did  report occasional substernal chest pain on exertion.  We made  significant changes in medical regimen during the last office visit; in  particular, added amlodipine as an antianginal to his medical regimen.  We dropped his hydrochlorothiazide.  We replaced his Hyzaar by Cozaar.  The patient has done very well right now.  He now reports no recurrence  of substernal chest pain.  He states that he has much more energy on the  bicycle.  He is able to ride the bike for 30 minutes at 14 miles/hour.  His only remaining problem from last office visit is continued insomnia.  He does not like to take sleeping medication because of its potential  addictive potential.   MEDICATIONS:  1. Bufferin aspirin 325 daily.  2. Klor-Con 8 mEq p.o. daily.  3. Toprol-XL 50 mg p.o. b.i.d.  4. Gemfibrozil 600 mg p.o. b.i.d.  5. Zocor 20 mg p.o. nightly.  6. Folic acid.  7. B12.  8. B6.  9. Colchicine 0.6 daily.  10.Vitamin D every 30 days.  11.Zinc 50 q.a.m.  12.Magnesium 250 q.p.m.  13.Cozaar 25 daily.  14.Amlodipine 10 mg p.o. daily.   PHYSICAL EXAMINATION:  VITAL SIGNS:  Blood pressure 113/58, heart rate  59, weight 177 pounds.  GENERAL:  A well-nourished white male in no apparent distress.  HEENT:  Pupils  isocoric.  Conjunctivae clear.  NECK:  Supple.  No carotid upstrokes.  No carotid bruits.  LUNGS:  Clear.  HEART:  Regular rate and rhythm.  Normal S1, S2.  ABDOMEN:  Soft.  EXTREMITIES:  No cyanosis, clubbing, or edema.   PROBLEMS LIST:  1. Insomnia, rule out secondary to anxiety.  2. History of coronary artery disease.      a.     Status post NIR stent placement to the mid right coronary       artery in June 2001.  3. Exertional angina, resolved.  4. Renal insufficiency, creatinine stable at 1.85.  5. Dyslipidemia.  6. Hypertension, resolved.  7. Prostate cancer with resection.  8. Status post lumbar spinal disk and neck fusion surgery.   PLAN:  1. The patient is significantly better after we placed him on Norvasc.      He has had no recurrent exertional angina.  2. I did give the patient prescription for trazodone as he has      significant  insomnia.  He apparently spends a lot of hours on the      computer, which makes him very anxious, particularly when certain      programs do not work.  I told him to try to step away from this on      occasion.  He is afraid to take his Lunesta and Ambien because of      addictive potential.  Therefore, I switched him to trazodone to      take 1 every night until his night-day rhythm has been stabilized.     Ernestine Mcmurray, MD,FACC  Electronically Signed    GED/MedQ  DD: 05/28/2008  DT: 05/29/2008  Job #: [id number]   cc:   Sherril Croon, M.D.  Heinz Knuckles Norins, MD

## 2010-05-23 NOTE — Assessment & Plan Note (Signed)
[facility]                          [facility] OFFICE NOTE   NAME:[name], [name]                   MRN:          [id number]  DATE:07/23/2006                            DOB:          28-Sep-1926    [facility] OFFICE NOTE   PRIMARY CARDIOLOGIST:  Dr. Terald Sleeper.   REASON FOR VISIT:  Scheduled 69-month followup.   Since last seen in the clinic in November of 2007 by Dr. Dannielle Burn, the  patient continues to do extremely well from a cardiac standpoint with no  interim development of signs/symptoms suggestive of unstable angina  pectoris.  He continues to exercise at a local gym 3 times a week with  no associated symptoms.  The patient is also closely followed by Dr.  Edrick Oh for known, mild renal insufficiency with a creatinine of 2-  2.5.  This is in the context of known 50% renal artery stenosis by  previous cardiac catheterization.   The patient reports having had a surveillance stress test within the  last 3 years.  He is now 7 years out since undergoing stenting (NIR) of  the RCA and PDA in June 2001.   Electrocardiogram today reveals sinus bradycardia at 56 beats per minute  with left axis deviation and no ischemic changes.   CURRENT MEDICATIONS:  1. Full dose aspirin.  2. Hyzaar 50/12.5 daily.  3. KCl supplement 88 mcg daily.  4. Metoprolol XL 50 b.i.d.  5. Gemfibrozil 600 b.i.d.  6. Zocor 20 nightly.  7. Folic acid.   PHYSICAL EXAM:  Blood pressure 116/69, pulse 56 and regular, weight 177.  GENERAL:  A 77 year old sitting upright in no distress.  HEENT:  Normocephalic, atraumatic.  NECK:  Palpable bilateral carotid arteries without bruits.  LUNGS:  Clear to auscultation in all fields.  HEART:  Regular rate and rhythm (S1, S2).  No significant murmurs.  ABDOMEN:  Soft and nontender.  Intact bowel sounds.  No epigastric  bruit.  EXTREMITIES:  No significant edema.  NEURO:  No focal deficits.   IMPRESSION:  1. Single  vessel coronary artery disease.      a.     Status post stenting (NIR) right coronary artery and       posterior descending artery, June 2001.  2. History of preserved left ventricular function.  3. Dyslipidemia/low HDL.  4. Hypertension.  5. Mild renal insufficiency.   PLAN:  1. Continue current medication regimen with exception of down-      titration of aspirin to a target dose of 81 mg daily.  2. Request most recent stress test results from our [facility] office      for review.  We will then plan on having a repeat followup stress      test in approximately 6 months, given that this would represent      approximately 3 years since his previous study.  3. Schedule return clinic followup with myself and Dr. Dannielle Burn in 6      months for review of stress test results and further      recommendations.      Gene Serpe,  PA-C  Electronically Signed      Ernestine Mcmurray, MD,FACC  Electronically Signed   GS/MedQ  DD: 07/23/2006  DT: 07/24/2006  Job #: [id number]   cc:   Heinz Knuckles. Linda Hedges, MD  Sherril Croon, M.D.

## 2010-05-23 NOTE — Discharge Summary (Signed)
[name], [name]            ACCOUNT NO.:  [id number]   MEDICAL RECORD NO.:  [id number]          PATIENT TYPE:  INP   LOCATION:  2011                         FACILITY:  [facility]   PHYSICIAN:  Marijo Conception. Wall, MD, FACCDATE OF BIRTH:  1926/03/15   DATE OF ADMISSION:  08/22/2007  DATE OF DISCHARGE:  08/25/2007                               DISCHARGE SUMMARY   PROCEDURES:  None.   PRIMARY FINAL DISCHARGE DIAGNOSIS:  Chest pain, medical therapy for  angina.   SECONDARY DIAGNOSES:  1. Status post percutaneous transluminal coronary angioplasty and      stent to the mid right coronary artery and posterior descending      artery in 2001.  2. Status post recent abnormal Cardiolite with mild inferior ischemia,      ejection fraction of 60%, and medical therapy recommended  3. Chronic kidney disease with a BUN of 19, creatinine of 1.85, and      GFR 35 making him stage III.  4. Hyperlipidemia.  5. Hypertension.  6. Gout.  7. History of prostate cancer with resection.  8. Status post left elbow lumbar spinal disk and neck fusion surgery.  9. Allergy or intolerance to MACROBID, TRIMOX, VIBRAMYCIN,      CIPROFLOXACIN, HYDRALAZINE, CODEINE, VICODIN, ASPIRIN, AMOXICILLIN,      DOXYCYCLINE, ATARAX, NIACIN, and ADVICOR, gastrointestinal upset      with regular ASPIRIN.  10.History of esophageal spasm.   TIME AT DISCHARGE:  38 minutes.   HOSPITAL COURSE:  Mr. [name] is a 77 year old male with known  coronary artery disease.  He came to the hospital on the day of  admission with chest pain which had been on and off all day.  He was  admitted for further evaluation.   His cardiac enzymes were negative for MI.  He developed a significant  headache on the day after admission.  Mr. [name] had a very  headache.  Isosorbide was held and the headache was treated  symptomatically.  It resolved.  A urinalysis was performed because the  patient felt he might have a urinary tract infection.   His urine was  positive for nitrites, moderate for leukocytes, and he had many  bacteria.  He was started on Bactrim which he takes at home for urinary  tract infection and is to complete a 7-day course.  (The 7-day course  was recommended by Dr. Reece Agar.)   By August 25, 2007, Mr. [name] was feeling better and ambulating  without chest pain or shortness of breath.  Dr. Verl Blalock evaluated Mr.  [name] and felt that he could be safely discharged home with  outpatient followup with Cardiology and Internal Medicine.   DISCHARGE INSTRUCTIONS:  1. His activity level is to be increased gradually.  2. He is cleared to return to Cardiac Rehab.  3. He is to stick to a low-fat and salt diet.  4. He is to follow up with Dr. Domenic Polite, with Dr. Dannielle Burn in September,      and he is to follow up with Dr. Linda Hedges as needed.   DISCHARGE MEDICATIONS:  1. Buffered or coated aspirin 325 mg  daily.  2. Colchicine 0.6 mg a day.  3. Pepcid 20 mg 1-2 times daily p.r.n.  4. Folic acid daily.  5. Lopid 600 mg b.i.d.  6. Cozaar/hydrochlorothiazide 50/12.5 daily.  7. Imdur 30 mg a day.  8. Toprol-XL 50 mg b.i.d.  9. Zocor 20 mg at bedtime.  10.Septra 1 tablet b.i.d.  11.Klor-Con 88 mcg daily.  12.B vitamin with vitamin D and zinc as well as magnesium daily.  13.Sublingual nitroglycerin p.r.n.      Rosaria Ferries, PA-C      Marijo Conception. Verl Blalock, MD, [facility]  Electronically Signed    RB/MEDQ  D:  08/25/2007  T:  08/26/2007  Job:  [id number]   cc:   Heinz Knuckles. Norins, MD

## 2010-05-23 NOTE — Assessment & Plan Note (Signed)
[place] [facility]                          [facility] OFFICE NOTE   NAME:[name], [name]                   MRN:          [id number]  DATE:07/19/2008                            DOB:          19-Mar-1926    HISTORY OF PRESENT ILLNESS:  The patient is a 77 year old male with a  history of coronary artery disease status post near stenting of the mid  to right coronary artery in June 2001.  The patient had a repeat cardiac  catheterization done in October 2009 demonstrating continued patency of  the RCA stent, but there was a modest lesion just prior to the crux and  a moderately severe stenosis in the continuation branch leading into the  posterolateral segment.  There were multiple diffuse tandem lesions  small in caliber in the distal LAD.   The patient over the last several weeks has had symptoms of orthostatic  hypotension.  He was seen by Dr. Linda Hedges who had probably adjusted his  medications.  He started having problems approximately 3 weeks ago.  Interestingly, the patient only drinks water, green tea, and Dr. Malachi Bonds  essentially the latter 2 all being diuretics.  The patient states when  he gets up quickly he feels dizzy and weak.  Dr. Linda Hedges confirmed  orthostasis with orthostatic blood pressures.  Medications were  adjusted, but thereafter the patient felt he was getting some more  shortness of breath and even developed some symptoms consistent with  angina.  In the past, we have tried IMDUR, but he cannot tolerate this  secondary to headache.  In the office today, we repeated the patient's  orthostatic blood pressures and indeed he remained orthostatic but  without significant increase in his heart rate.  There does appear to be  some element of chronotropic insufficiency.   PHYSICAL EXAMINATION:  VITAL SIGNS:  Blood pressure 146/77, heart rate  63 orthostatic see chart.  Weight is 176 pounds.  GENERAL:  Well-nourished white male in no  apparent distress.  HEENT:  Pupils isochoric.  Conjunctivae clear.  NECK:  Supple.  Normal carotid stroke and no carotid bruits.  LUNGS:  Clear breath sounds bilaterally.  HEART:  Regular rate and rhythm with normal S1 and S2.  No murmur, rubs,  or gallops.  ABDOMEN:  Soft, nontender.  No rebound or guarding.  Good bowel sounds.  EXTREMITIES:  No cyanosis, clubbing, or edema.  NEUROLOGIC:  The patient is alert, oriented, and grossly nonfocal.   PROBLEMS:  1. History of coronary artery disease.      a.     Status post near stent placement in the mid right coronary       artery in June 2001.  2. Exertional angina.  3. Renal insufficiency.  Creatinine 1.85.  4. Orthostatic hypotension.  5. Prostate cancer with resection.  6. Status post lumbar spinal disk surgery.  7. Fusion surgery.   PLAN:  1. The patient clearly has orthostatic hypotension.  It could be a      component of dehydration.  Unfortunately, I did not ask the patient      upon discharge if he  is still taking his trazodone which is likely      a contributor also to his orthostasis.  2. The patient clearly needs increased fluid intake and I have told      him to stop the green tea and Dr. Malachi Bonds and to start G2.  3. The patient will e-mail me in the next 24-48 hours, at which point      in time we probably can start amlodipine at a small dose and Toprol-      XL which the patient needs as an antianginal effect.  The patient      does appear to have some chronotropic insufficiency and it may well      be that Toprol cannot be further increased.  4. If the above measures fail, I do not think that Florinef      necessarily wall work also for this patient but rather further      workup for dysautonomia may be in order.     Ernestine Mcmurray, MD,FACC  Electronically Signed    GED/MedQ  DD: 07/19/2008  DT: 07/20/2008  Job #: [id number]

## 2010-05-23 NOTE — Assessment & Plan Note (Signed)
[place] OFFICE NOTE   NAME:[name], [name]                   MRN:          [id number]  DATE:08/08/2007                            DOB:          1926/04/30    PRIMARY CARDIOLOGIST:  Ernestine Mcmurray, MD, [facility]   NEPHROLOGIST:  Sherril Croon, MD   PRIMARY CARE PHYSICIAN:  Heinz Knuckles. Norins, MD   REASON FOR VISIT:  Routine followup.   HISTORY OF PRESENT ILLNESS:  I am seeing [name] in Dr. Arlina Robes  absence.  His history is well-detailed in the previous note from July 16, 2007, by Mr. Serpe.  He is being managed medically at this time for  presumed progressive underlying coronary atherosclerosis based on an  abnormal exercise Cardiolite indicating mild inferior apical ischemia  with an ejection fraction of 60%.  He continues to report exertional  angina.  He does feel like Imdur has helped to some degree, however.  He  has cut back his typical exercise regimen on the treadmill in half due  symptoms.  Hemodynamically, his blood pressure and heart rate are very  well-controlled from the perspective of angina.  Today his  electrocardiogram shows sinus bradycardia at 55 beats per minute, but  otherwise nonspecific changes and no marked changes in comparison to his  previous tracing.  Cardiac catheterization has not been pursued as yet  given a history of baseline chronic renal insufficiency.  His last  creatinine was 1.9 back in April of this year.  We touched on these  issues again today and his preference is to continue medical therapy for  the time being.   ALLERGIES:  Multiple and listed in the chart.   PRESENT MEDICATIONS:  1. Aspirin 325 mg p.o. daily.  2. Hyzaar 50/12.5 mg p.o. daily.  3. Klor-Con 88 mcg p.o. daily.  4. Toprol-XL 50 mg p.o. b.i.d.  5. Gemfibrozil 600 mg p.o. b.i.d.  6. Zocor 20 mg p.o. q.h.s.  7. Folic acid.  8. B vitamin supplements.  9. Colchicine 0.6 mg p.o. daily.  10.Vitamin D.  11.Imdur 30 mg p.o. daily.  12.Zinc 30 mg p.o. q.a.m.  13.Magnesium 250 mg p.o. q.p.m.  14.Sublingual nitroglycerin 0.4 mg p.r.n.   REVIEW OF SYSTEMS:  As per history of present illness.  Otherwise  negative.   PHYSICAL EXAMINATION:  VITAL SIGNS:  Blood pressure today is 103/60,  heart rate is 58, weight is 178.2 pounds.  GENERAL:  The patient is comfortable and in no acute distress, well  nourished, in no active symptoms.  NECK:  No elevated jugular venous pressure.  No loud bruits.  No  thyromegaly is noted.  LUNGS:  Clear without labored breathing at rest.  CARDIAC:  Regular rate and rhythm.  No pericardial rub or S3 gallop.  EXTREMITIES:  No significant pitting edema.   IMPRESSION AND RECOMMENDATIONS:  1. History of coronary disease, status post NIR stent placement to the      mid right coronary artery and posterior descending branch back in      June 2001.  He has symptoms consistent with exertional angina  and a      recent exercise Cardiolite indicating mild inferior apical ischemia      with ejection fraction of 60%.  Medical therapy is being pursued at      this point given underlying renal insufficiency with a creatinine      of 1.9 and risk for contrast-induced nephropathy.  He prefers to      continue medical therapy at this time.  I did mention that he can      increase his Imdur to 30 mg p.o. b.i.d. if needed, and otherwise I      will plan to have him come back over the next 4-6 weeks to see Dr.      Dannielle Burn.  2. History of renal insufficiency and mild right renal artery stenosis      documented previously.  3. Hyperlipidemia, on statin therapy.     Satira Sark, MD  Electronically Signed    SGM/MedQ  DD: 08/08/2007  DT: 08/09/2007  Job #: [id number]   cc:   Ernestine Mcmurray, MD,FACC  Sherril Croon, M.D.  Heinz Knuckles Norins, MD

## 2010-05-23 NOTE — Cardiovascular Report (Signed)
NAMECHRIS, GARCIA            ACCOUNT NO.:  [id number]   MEDICAL RECORD NO.:  [id number]          PATIENT TYPE:  OIB   LOCATION:  2899                         FACILITY:  [facility]   PHYSICIAN:  Loretha Brasil. Lia Foyer, MD, FACCDATE OF BIRTH:  11/13/1926   DATE OF PROCEDURE:  DATE OF DISCHARGE:  10/17/2007                            CARDIAC CATHETERIZATION   INDICATIONS:  Mr. Hardiman is well known to [place].  He is a 77 year old  gentleman with renal insufficiency.  He has been pretreated prior to  catheterization.  The patient has a history of prior stenting of the  right coronary artery in 2001 and is followed up with Dr. Dannielle Burn ever  since.  The current study was done to assess coronary anatomy.   PROCEDURES:  1. Left heart catheterization.  2. Selective coronary arteriography.   Ventriculography was not performed to limit contrast.   DESCRIPTION OF PROCEDURE:  The patient was brought to the cath lab and  prepped and draped in the usual fashion.  He had been given nearly 8  hours of intravenous fluids prior to the procedure.  His creatinine was  1.8 at onset.  Through an anterior puncture, the femoral artery was  entered.  The procedure was completed with 28 mL of contrast.  There  were no complications.  He was taken to the holding area in satisfactory  clinical condition.   HEMODYNAMIC DATA:  1. Central aortic pressure was 166/72, mean 111.  2. Left ventricular pressure 164/10.  3. There was no gradient and pullback across the aortic valve.   ANGIOGRAPHIC DATA:  1. The right coronary artery has been previously stented.  There is      moderate calcification.  Proximally, there is about 20% area of      stenosis.  The midvessel had been previously stented and there is      less than 10% renarrowing at the stent site which appears to be      smooth throughout the midvessel.  The vessel then opens up and      there is about 20%-30% eccentric plaquing at the acute margin.  The  vessel then opens up and there is some mild luminal irregularity      throughout.  Just prior to the crux, there is an area of focal 40%-      50% narrowing that appears to be eccentric.  This leads into the      PDA which has been previously stented and the PDA itself is widely      patent.  The continuation branch provides two posterolateral      branches.  At the origin of the first posterolateral branch, is a      stenosis in the continuation branch that measures about 70% luminal      reduction.  The posterolateral branches moderate in size, being      approximately 2.25 mm.  2. The left main coronary is free of critical disease.  3. The LAD courses to the apex.  The LAD proper has some modest      calcification noted.  There is a first diagonal  branch which      bifurcates and has perhaps 30% ostial narrowing.  The LAD mid then      has about a 20% area of narrowing.  After the second diagonal are      multiple tandem lesions of 70% and probably 80%-90% down at the      apical tip.  The apical tip lesion then bifurcates distally.  It is      relatively small caliber vessel.  4. There is a ramus intermedius with minimal luminal irregularity.  5. The circumflex proper has about 30% segmental plaquing proximally.      The vessel then bifurcates into two marginal branches, both of      which are relatively small in caliber.   CONCLUSIONS:  1. Continued patency of the RCA stents.  2. Modest lesion just prior to the crux and moderately severe stenosis      in the continuation branch leading into the posterolateral segment.  3. Multiple diffuse tandem lesions in the small caliber distal LAD.   DISPOSITION:  My leaning at this time would be in the direction of  medical therapy given the patient's renal insufficiency.  If we were to  have an intervention, we would likely recommend a small-caliber stent in  the continuation branch after the PDA takeoff.  However, initially I  would  probably favor medical therapy.  We will have him follow up with  Dr. Dannielle Burn.      Loretha Brasil. Lia Foyer, MD, [facility]  Electronically Signed     TDS/MEDQ  D:  10/17/2007  T:  10/18/2007  Job:  [id number]   cc:   Ernestine Mcmurray, MD,FACC

## 2010-05-23 NOTE — Assessment & Plan Note (Signed)
[place] OFFICE NOTE   NAME:[name], [name]                   MRN:          [id number]  DATE:10/02/2007                            DOB:          August 26, 1926    NEPHROLOGIST:  Sherril Croon, MD   PRIMARY CARE PHYSICIAN:  Heinz Knuckles. Norins, MD   REASON FOR VISIT:  Routine followup.   HISTORY OF PRESENT ILLNESS:  The patient is a pleasant 77 year old male  with a history of coronary artery disease status post by NIR stenting of  mid to right coronary artery, posterior descending artery in June 2001.  He had a negative stress test in 2007; however, more recent stress test  showed mild inferior apical ischemia with an ejection fraction of 60%.  On August 22, 2007, the patient reported atypical chest pain and was  referred to [facility].  He was ruled out for myocardial infarction, but  no further intervention was done.  He did complain of a headache and  isosorbide was held.  He did have UTI and this was treated.  No further  plans and arrangements were made; however, for his chest pain.  He now  follows up with me in the office.  He states that at times, he still has  substernal chest pain, particularly on exertion.  He also has cut back  on his typical exercise regimen of treadmill due to symptoms.  More  recently, he also complained of insomnia and has received Lunesta from  Dr. Linda Hedges, which he has not always taken.  Interestingly, the patient  although not really reporting any depression or anxiety, does state that  he has been feeling somewhat restless over the last several weeks.   MEDICATIONS:  1. Aspirin 325 daily.  2. Hyzaar 50/12.5 mg p.o. daily.  3. Klor-Con 80 mcg p.o. daily.  4. Toprol-XL 50 mg p.o. b.i.d.  5. Gemfibrozil 600 mg p.o. b.i.d.  6. Zocor 20 mg p.o. nightly.  7. Folic acid.  8. Colchicine 0.6 q.p.m.  9. Vitamin D.  10.Zinc.  11.Magnesium 250 mg q.p.m.   PHYSICAL  EXAMINATION:  VITAL SIGNS:  Blood pressure is 113/64, heart  rate is 59, weight is 176.  NECK:  Normal carotid upstroke and no carotid bruits.  LUNGS:  Clear breath sounds bilaterally.  HEART:  Regular rate and rhythm with normal S1 and S2.  No murmur, rubs  or gallops.  ABDOMEN:  Soft, nontender.  No rebound or guarding.  Good bowel sounds.  EXTREMITIES:  No cyanosis, clubbing, or edema.  NEUROLOGIC:  The patient is alert, oriented, and grossly nonfocal.   PROBLEM LIST:  1. History of coronary artery disease status post NIR stent placement      to the mid right coronary artery, posterior descending artery in      June 2001.  2. Exertional angina.  3. Renal insufficiency.  Creatinine 1.85 with CKD stage III.  4. Dyslipidemia.  5. Hypertension.  6. Prostate cancer with resection.  7. Status post lumbar, spinal disk and neck fusion surgery.   ALLERGIES AND INTOLERANCES:  MACROBID, TRIMOX, VIBRAMYCIN,  CIPROFLOXACIN, HYDRALAZINE, CODEINE, VICODIN, ASPIRIN, AMOXICILLIN,  DOXYCYCLINE, ATARAX, NIACIN and ADVICOR, history of esophageal spasm.   PLAN:  1. The patient continues to exhibit symptoms of angina.      Unfortunately, now he is taken off Imdur.  Therefore, I made some      significant changes in medical regimen and we stopped his Hyzaar,      replaced it by Cozaar 50 mg p.o. daily and dropped his      hydrochlorothiazide and added amlodipine 5 mg p.o. daily as an      antianginal to his medical therapy.  2. I told the patient to stay in close contact with me and if he has      ongoing symptoms of angina, particularly given some of his feelings      of restlessness, I am inclined to consider a diagnostic      catheterization which can be done with 20 mL of dye at very little      risk for worsening his renal insufficiency.     Ernestine Mcmurray, MD,FACC  Electronically Signed    GED/MedQ  DD: 10/03/2007  DT: 10/04/2007  Job #: [id number]   cc:   Sherril Croon, M.D.  Heinz Knuckles  Norins, MD

## 2010-05-23 NOTE — H&P (Signed)
[name], [name]            ACCOUNT NO.:  [id number]   MEDICAL RECORD NO.:  [id number]          PATIENT TYPE:  EMS   LOCATION:  [facility]                         FACILITY:  [facility]   PHYSICIAN:  Nigel Bridgeman, MD     DATE OF BIRTH:  12-29-1926   DATE OF ADMISSION:  08/22/2007  DATE OF DISCHARGE:                              HISTORY & PHYSICAL   CARDIOLOGIST:  Ernestine Mcmurray, MD, [facility].   PRIMARY MEDICAL DOCTOR:  Heinz Knuckles. Norins, MD.   CHIEF COMPLAINT:  Chest pain.   HISTORY OF PRESENT ILLNESS:  This is a 77 year old white male with a  history of coronary artery disease and chronic renal insufficiency  secondary to contrast nephropathy who presents with chest pain.  The  patient has had chest pain off and on all day.  Usually at cardiac  rehabilitation he has chest tightness followed by pencil poking pains  in the left heart area -- today he just had the poking.  No radiation,  but quite severe pain, associated with shortness of breath, nausea and  vomiting x2, and presyncope.  No diaphoresis, lower extremity edema,  orthopnea, or paroxysmal nocturnal dyspnea.  He otherwise remains active  and does not become short of breath with exertion, which was his  presenting complaint when he received his original stent in 2001.  Of  note, the patient received 3 sublingual nitroglycerin in transit to the  emergency room by EMS personnel.  With the 3 sublingual nitroglycerin he  received a tremendous headache and his chest pain eased off considerably  but did not go away completely.   PAST MEDICAL HISTORY:  1. Coronary artery disease, status post NIR stent to mid RCA and PDA      in June 2001.  He recently had an abnormal Cardiolite stress test      with mild inferior ischemia and an ejection fraction of 60%.  Being      treated medically due to past history of renal contrast      nephropathy.  2. Chronic renal insufficiency with a baseline creatinine of 1.9 as a      result of a  catheterization.  3. Hyperlipidemia.  4. Hypertension.  5. Gout.  6. Prostate cancer, status post resection in 2000.  7. Status post neck fusion surgery.  8. Status post lumbar spinal disk surgery.  9. Status post left elbow surgery.   ALLERGIES:  MACROBID, TRIMOX, VIBRAMYCIN, CIPROFLOXACIN, HYDRALAZINE,  CODEINE, VICODIN, ASPIRIN, AMOXICILLIN, DOXYCYCLINE, ATARAX, NIACIN,  ADVICOR.  (THE REACTION TO ASPIRIN IS GI UPSET.)   MEDICATIONS:  1. Aspirin 325 mg daily.  2. Hyzaar 50/12.5 mg daily.  3. Klor-Con 88 mcg daily.  4. Toprol XL 50 mg twice a day.  5. Gemfibrozil 600 mg b.i.d.  6. Zocor 20 mg q.h.s.  7. Folic acid.  8. B vitamins.  9. Colchicine 0.6 mg daily.  10.Vitamin D.  11.Imdur 30 mg daily.  12.Zinc 30 mg daily.  13.Magnesium 250 mg every evening.  14.Sublingual nitroglycerin as needed.   SOCIAL HISTORY:  He lives in [place] with his wife.  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  He is now retired.  No tobacco or drugs.  Seldom alcohol use.   FAMILY HISTORY:  Noncontributory.   REVIEW OF SYSTEMS:  Complete review of systems was done and found to be  otherwise negative except as stated in the HPI.   PHYSICAL EXAMINATION:  VITAL SIGNS:  His temperature is afebrile with a  pulse of 64, respiratory rate of 24, and blood pressure of 167/95.  His  O2 sats are 100% on 2 L.  GENERAL:  He is in no acute distress and of average to thin weight.  HEENT:  Shows [place]/AT, PERRLA, EOMI, MMM.  Oropharynx without erythema or  exudates.  NECK:  Supple without lymphadenopathy, thyromegaly, bruits or jugular  venous distention.  HEART:  Has a regular rate and rhythm with a normal S1 and S2 without  murmurs, gallops or rubs.  Normal PMI.  Pulses 2+ and equal bilaterally  without bruits.  LUNGS:  Clear to auscultation bilaterally without wheezes, rhonchi or  rales.  SKIN:  Has no rashes or lesions.  ABDOMEN:  Soft and nontender with  normal bowel sounds.  No rebound or  guarding.  Negative hepatosplenomegaly.  EXTREMITIES:  Show no cyanosis, clubbing or edema.  MUSCULOSKELETAL:  Shows no joint deformity, effusions.  No spine or CVA  tenderness.  NEUROLOGIC:  He is alert and oriented x3 with cranial nerves II-XII  grossly intact.  Strength 5/5 all extremities and axial groups with  normal sensation throughout.   RADIOLOGY:  Chest x-ray shows no acute cardiopulmonary disease.   EKG:  Normal sinus rhythm with a rate of 58.  He has no ST/T wave  changes.  Axis is negative 42.   LABORATORIES:  His white count is 4.5 with a hemoglobin of 12.7 and  platelet count of 175.  Creatinine is 1.96.  Troponin and CK-MB are both  negative at this time.  He received a urinalysis, which showed moderate  leukocyte esterase with positive nitrites and many bacteria.   ASSESSMENT/PLAN:  This is a 77 year old white male with a history of  coronary artery disease, status post stenting in 2001, with chronic  renal insufficiency due to contrast nephropathy who presents with chest  pain with features concerning for acute coronary syndrome.  The patient  has had a positive stress test and is being treated medically due to his  risk of kidney failure with catheterization.  He would like to continue  this course if possible.  He will be admitted to rule out myocardial  infarction.  His current medications will be continued with a focus on  blood pressure control (he missed his evening meds and therefore his  blood pressure is elevated tonight).  He also  appears to have a urinary tract infection, which he has had in the past  and has been treated with Bactrim.  This may be factoring into his  overall feeling of malaise and nausea and vomiting.  I will treat this  with Bactrim as this has worked for him in the past without an allergic  reaction (total treatment time will be 7 days).      Nigel Bridgeman, MD  Electronically Signed      ACJ/MEDQ  D:  08/22/2007  T:  08/22/2007  Job:  [id number]

## 2010-05-26 NOTE — Assessment & Plan Note (Signed)
[place]                                   ON-CALL NOTE   NAME:[name], [name]                   MRN:          [id number]  DATE:09/08/2005                            DOB:          September 06, 1926    OBJECTIVE:  The patient is not able to walk on his right foot this morning.  Yesterday morning when he first got up, first stepping onto the floor, his  foot ached somewhat.  Walking improved it.  He had pain off and on through  the day.  Last night it became more uncomfortable and as the night wore on  he had significant pain that kept him up all night.  The area is in the  right MTP joint.  The sole of the foot has swollen.  There is no bite mark  that he can find.  He does not walk bare-footed.  It is now warm and red.  No history of gout in the past.   ASSESSMENT:  Right foot swelling with pain.   PLAN:  Come in to be seen.  I will see him in the office.                                   Modesto Charon, MD   RNS/MedQ  DD:  09/08/2005  DT:  09/09/2005  Job #:  [id number]   cc:   Heinz Knuckles. Norins, MD

## 2010-05-26 NOTE — Discharge Summary (Signed)
[place]  Patient:    [name], [name]                   MRN: [id number] Adm. Date:  [id number] Disc. Date: 07/08/99 Attending:  Ernestine Mcmurray Dictator:   Sharyl Nimrod, P.A.C. CC:         Terald Sleeper, M.D., Cardiology             Adella Hare, M.D., Medicine                           Discharge Summary  SUMMARY OF HISTORY:  Mr. [name] is a 77 year old white male who has a history of substernal chest discomfort with three previous Cardiolites, the last one in  March.  He stated there is something wrong with the inferior wall.  His discomfort increased with radiation to the left arm on the evening of admission.  It was associated with exertion.  He did not have any associated diaphoresis or shortness of breath.  His history is notable for knee discomfort and he has been told he ay need surgery, prostate surgery, and surgery that he underwent in [place], GERD, prostate cancer, degenerative joint disease, hypertension.  LABORATORY DATA: Admission hemoglobin 12.5, hematocrit 36.5, normal indices, platelet count 227,000, WBC 5.1.  Sodium 137, potassium 3.8, BUN 24, creatinine  1.4, glucose 128.  CKs and MBs were negative for myocardial infarction. Probably not fasting lipids since they were done 2135, showed a total cholesterol of 144, triglycerides 207, HDL 25, LDL 78, with a ratio of 5.8.  Chest x-ray did not show any active disease.  EKG showed normal sinus rhythm, left axis deviation, nonspecific ST-T wave changes.  HOSPITAL COURSE:  Mr. [name] was admitted to 6500.  Overnight, he did not ave any further chest discomfort.  Enzymes and EKGs were negative for myocardial infarction.  Cardiac catheterization was performed on 6/29 by Dr. Dannielle Burn.  This  showed a 10% mid-LAD, distal 70% LAD, 99% mid-RCA, 50% distal RCA, 60% PLA, 80%  PDA, ejection fraction 60%.  Dr. Lia Foyer performed angioplasty on the mid-RCA and the  proximal PDA, reducing both lesions to 0%.  The other lesions were not dilated because of a distal lumen in the RCA was greater than 2.  The PLA bifucation lesion was in a small vessel.  Post-sheath removal and bed rest.  He was ambulating without difficulty and he was seen by cardiac rehabilitation and it was felt that he could be discharged home.  DISCHARGE DIAGNOSES: 1. Unstable angina, status post cardiac catheterization with angioplasty to the    mid-RCA and PDA without difficulty. 2. Residual nonobstructive coronary artery. 3. Hyperlipidemia (questionable fasting). 4. History as previously described.  DISPOSITION:  He is discharged home.  He was instructed not to take his Imdur.   MEDICATIONS: 1. Plavix 75 mg q. d. for four weeks. 2. Aspirin 325 q. d. 3. Lipitor 10 mg q. h.s. 4. Lopressor increased to 50 mg b.i.d. 5. New prescription for Protonix 40 mg q. d. 6. Sublingual nitroglycerin as needed.  He is advised no lifting, driving, sexual activity, or heavy exertion for two days. Maintain low salt, fat, and cholesterol diet.  If he has any problems with his catheterization site, he was instructed to call immediately.  On Monday, he was  asked to call our office to arrange a followup two to three week appointment with Dr. Dannielle Burn or the physician assistant.  The lipid clinic will need to call him o arrange a followup appointment and establishment in their clinic.  Dr. Lia Foyer notes possible niacin addition to his medications.   disease. DD:  07/08/99 TD:  07/08/99 Job: [id number]

## 2010-05-26 NOTE — Op Note (Signed)
NAMEJERIMYAH, WILDERMUTH            ACCOUNT NO.:  [id number]   MEDICAL RECORD NO.:  [id number]          PATIENT TYPE:  OIB   LOCATION:  2899                         FACILITY:  [facility]   PHYSICIAN:  Ethelle Lyon, M.D. LHCDATE OF BIRTH:  Oct 23, 1926   DATE OF PROCEDURE:  11/27/2003  DATE OF DISCHARGE:  11/26/2003                                 OPERATIVE REPORT   PROCEDURE:  Selective right renal angiography.   INDICATIONS:  Mr. Skillin is a 77 year old gentleman with progressive  renal insufficiency with creatinine climbing to 2.3.  MRA demonstrated  severe stenosis of the right renal artery.  Given his progressive renal  insufficiency, I recommended angiography with an eye to percutaneous  revascularization of the renal artery.   PROCEDURAL TECHNIQUE:  Informed consent was obtained.  Under 1% local  anesthesia, a 6 French sheath was placed in the right common femoral artery  using the modified Seldinger technique.  A pigtail catheter was advanced to  the suprarenal abdominal aorta.  Abdominal aortography was performed using  carbon dioxide as the contrast agent.  This clearly demonstrated the left  renal artery to be normal.  It suggested at least moderate stenosis of the  proximal right renal artery.  In anticipation of intervention, 7000 units of  heparin was administered.  A 6 French LIMA guide was advanced over a wire  and engaged in the ostium of the right renal artery.  Angiography was  performed by hand injection of gadolinium.  This demonstrated only a 50%  stenosis.  To further clarify the severity of the stenosis, I performed a  pressure measurement.  A stabilizer wire was advanced into the renal artery.  Over this a 4 French end-hole catheter was advanced.  Simultaneous pressures  were measured in the mid-renal artery and aorta.  This demonstrated a 12  mmHg translesional gradient, less than the 20 which is felt to be  significant for renal arteries.  I thus felt that  this was only a moderate  stenosis and not likely a significant contributor to his renal dysfunction.  The procedure was therefore completed.   After angiography of the common femoral artery demonstrating the sheath to  enter the common femoral, the arteriotomy was closed using a 6 [place]  Angioseal device.  Complete hemostasis was obtained.  The patient tolerated  the procedure well and was transferred to the holding room in stable  condition.   COMPLICATIONS:  None.   FINDINGS:  1.  A 50% ostial stenosis of the single right renal artery with a 12 mm      translesional gradient.  2.  Left renal artery:  Single vessel, which is angiographically normal as      assessed with CO2.   IMPRESSION/RECOMMENDATION:  Moderate unilateral renal artery stenosis.  This  does not explain his renal dysfunction.  Recommend conservative management.  Would obtain renal ultrasound versus repeat MR in approximately one year's  time to exclude significant progression.      Will   WED/MEDQ  D:  11/26/2003  T:  11/27/2003  Job:  [id number]   cc:   Luvenia Heller  Gerlene Burdock, M.D. [facility]   Heinz Knuckles. Norins, M.D. [facility]

## 2010-05-26 NOTE — Assessment & Plan Note (Signed)
[facility]                          [facility] OFFICE NOTE   NAME:[name], [name]                   MRN:          [id number]  DATE:01/22/2007                            DOB:          [birth year]    HISTORY OF PRESENT ILLNESS:  Patient is a very pleasant 77 year old male  with a history of single-vessel coronary artery disease.  The patient is  now status post stenting for approximately seven years.  This patient  received a near right coronary artery stent at that time.  He had stress  testing done as most recently as 2007 in our [place] office which was  negative for ischemic.  He has done remarkably well with his near-stent.  He is also followed by Dr. Justin Mend for renal insufficiency with creatinines  ranging between 2 and 2.5.  He is known to have a 50% renal artery  stenosis by previous cardiac catheterization.  Dr. Linda Hedges also follows  closely his lipid panel, which we reviewed in the office today.  Particularly, his LDL was elevated at 129, but his HDL has improved to  35 mg%.  The patient states that he has no substernal chest pain.  He  has no shortness of breath, orthopnea, or PND.  He has no palpitations  or syncope.  He is actually doing remarkably well.   MEDICATIONS:  1. Bufferin/aspirin 325 daily.  2. Hyzaar 50/12.5 mg daily.  3. Klor-Con 88 mcg p.o. daily.  4. Toprol XL 50 mg 1 tablet p.o. b.i.d.  5. Gemfibrozil 600 mg 1 tablet p.o. b.i.d.  6. Zocor 20 mg p.o. nightly.  7. Folic acid, 123456, and B6.  8. Colchicine 0.6 mg p.o. daily.  9. Vitamin D.   PHYSICAL EXAMINATION:  VITAL SIGNS:  Blood pressure 127/69, heart rate  62, weight 175 pounds.  NECK:  Normal carotid upstrokes.  No carotid bruits.  LUNGS:  Clear breath sounds bilaterally.  HEART:  Regular rate and rhythm.  Normal S1 and S2.  No murmurs, gallops  or rubs.  ABDOMEN:  Soft, nontender.  No rebound or guarding.  Good bowel sounds.  EXTREMITIES:  No clubbing,  cyanosis or edema.   PROBLEM LIST:  1. Single vessel coronary artery disease.      a.     Asymptomatic.      b.     Stress testing in 2007 with no ischemia.      c.     Status post NIR right coronary stent in posterior descending       artery in June, 2001.      d.     Preserved left ventricular function.  2. Renal insufficiency, followed by Dr. Justin Mend.  3. Dyslipidemia, followed by Dr. Linda Hedges.  4. Hypertension, stable.  5. Mild renal artery stenosis.   PLAN:  1. Patient from a cardiovascular standpoint is suddenly stable.  He      continues to be very active and goes to the gym on a daily basis.      He clearly has no symptoms of angina.  His most recent stress test      is  from 2007 and does not need to be repeated.  This can be      deferred until 2009 or 2010.  2. We had a discussion about the patient's lipid panel, and I gave him      the option regarding this, either do nothing, which is really my      personal preference, but if we follow closely the guidelines,      certainly a doubling of his dose of Zocor is an option with close      monitoring of his LFTs or switching the patient to Crestor to      further achieve an increase in his HDL.  I have written      recommendation now for the patient, and he states that he would      like to discuss it with Dr. Linda Hedges.     Ernestine Mcmurray, MD,FACC  Electronically Signed    GED/MedQ  DD: 01/23/2007  DT: 01/23/2007  Job #: [id number]   cc:   Heinz Knuckles. Linda Hedges, MD  Sherril Croon, M.D.

## 2010-05-26 NOTE — Assessment & Plan Note (Signed)
[facility]                            [facility]ICE NOTE   NAME:[name], [name]                   MRN:          [id number]  DATE:11/20/2005                            DOB:          11/18/1926    HISTORY OF PRESENT ILLNESS:  The patient is a 76 year old male with a  history of coronary artery disease, status post stenting to the mid right  coronary artery and proximal posterior descending artery with near stent in  June 2001.  The patient has been doing well.  He reports no substernal chest  pain.  He has no shortness of breath, orthopnea, PND.  He is followed by Dr.  Justin Mend for mild renal insufficiency.   MEDICATIONS:  1. Bufferin aspirin 325 every day.  2. Hyzaar 50/12.5 every day.  3. Klor-Con 88 mcg p.o. every day.  4. Toprol XL 50 mg p.o. b.i.d.  5. Gemfibrozil 600 mg p.o. b.i.d.  6. Zocor 20 mg p.o. q.h.s.  7. Folic acid.   PHYSICAL EXAMINATION:  VITAL SIGNS:  Blood pressure is 116/62, heart rate is  64 beats per minute, weight is 180 pounds.  NECK:  Reveals normal carotid upstrokes and no carotid bruits.  LUNGS:  Clear breath sounds bilaterally.  HEART:  Regular rate and rhythm.  Normal S1 S2.  No murmurs, rubs, or  gallops.  ABDOMEN:  Soft nontender.  No rebound or guarding.  Good bowel sounds.  EXTREMITIES:  No cyanosis, clubbing, or edema.   PROBLEM LIST:  1. Renal insufficiency.  2. Coronary artery disease.      a.     Status post near stent to the right coronary artery, posterior       descending artery in 2001.      b.     Good left ventricular function.  3. Hypertension.  4. Dyslipidemia with low HDL.   PLAN:  1. The patient will have an EKG done today in the office.  2. He can continue to follow with Dr. Linda Hedges regarding his dyslipidemia.      I have made no changes.  The patient is intolerant to niacin.  3. The patient will need a stress test during his next visit in 6 months      from now.    Ernestine Mcmurray, MD,FACC  Electronically Signed   GED/MedQ  DD: 11/20/2005  DT: 11/20/2005  Job #: [id number]

## 2010-05-26 NOTE — Assessment & Plan Note (Signed)
[facility]                           PRIMARY CARE OFFICE NOTE   NAME:[name], [name]                   MRN:          [id number]  DATE:01/18/2006                            DOB:          15-Jul-1926    [name] is a 77 year old gentleman, well known to the practice,  who follows for multiple medical problems including cardiac disease.  He  presents acutely because of respiratory infection.  I do not have his  chart available to me.   The reports about a 12 to 18-hour history of onset of cough that  initially was producing clear sputum, which this morning was producing a  dark, thick, brownish sputum.  He has had minimal shortness of breath.  He has had no rigors, no significant documented fever.  He does have  some sinus congestion.   CURRENT MEDICATIONS:  Are well documented in his chart.  Please see  previous notes.   EXAMINATION:  Temperature is 97.7.  Blood pressure 106/58.  Pulse 60.  Weight was 180.  GENERAL APPEARANCE:  A well-nourished, well-developed, slender gentleman  who looks his stated age.  In no acute distress.  HEENT EXAM:  Patient had no tenderness to percussion over his frontal or  maxillary sinuses.  Posterior pharynx was clear with no erythema or  exudate.  CHEST:  Patient is moving air well with no rales, wheezes or rhonchi.  No increased work of breathing.  CARDIOVASCULAR:  Patient's precordium was quiet with a regular rate and  rhythm.   IMPRESSION AND PLAN:  Upper respiratory infection versus bronchitis.   PLAN:  We will start the patient on trimethoprim sulfamethoxazole double  strength b.i.d. for 7 days.  He does not have a sulfa allergy.  Patient  can use an over-the-counter cough syrup of choice since he is allergic  to CODEINE.  He should take Mucinex 2 tablets a.m. and p.m. to help  mobilize secretions.  Hydrate.  Take vitamin C at 1500 mg daily.  He is  to notify the office if his symptoms get  worse.     Mike Knuckles Norins, MD  Electronically Signed    MEN/MedQ  DD: 01/18/2006  DT: 01/18/2006  Job #: [id number]

## 2010-05-26 NOTE — Discharge Summary (Signed)
[place]. [facility]  Patient:    [name], [name]                   MRN: [id number] Adm. Date:  [id number] Disc. Date: 11/21/99 Attending:  Ernestine Mcmurray Dictator:   Sharyl Nimrod, P.A.-C. CC:         Heinz Knuckles. Norins, M.D. [facility]   Discharge Summary  DATE OF BIRTH:  1926/11/07  HISTORY OF PRESENT ILLNESS:  [name] is a 77 year old white male, who while at rehabilitation completed the program, and he was walking afterwards and developed left-sided chest discomfort.  He felt it was a 3/10 at the worst, and it did not resemble his pre-angioplasty discomfort.  He did not have any changes with palpation.  The discomfort lasted for less than five seconds, and occurred two to three times per minute.  He took a sublingual nitroglycerin without relief.  The second sublingual nitroglycerin caused a loss of consciousness, which resolved in a supine position.  He has had three or four episodes of this since June 09, 1999.  He has also noted prior fatigue.  PAST MEDICAL HISTORY: 1. Notable for angioplasty and stenting of the RCA and PDA on July 07, 1999.  He has residual disease with a 10%-20% proximal LAD, a    70% distal LAD, a 50% distal RCA, a 60% PL branch. 2. He has a history of  prostate cancer. 3. Esophageal spasm. 4. Pulmonary hypertension.  LABORATORY DATA:  CPKs and troponins were negative for a myocardial infarction.  Sodium 140, potassium 4.5, BUN 32, creatinine 1.5, glucose 93. H&H 12.4 and 37.2.  Normal indices, platelets 226, wbcs 5.9.  Chest x-ray did not show any acute findings.  Electrocardiogram showed a normal sinus rhythm without any acute changes.  HOSPITAL COURSE:  [name] was admitted to 3700.  Overnight he did not have any further chest discomfort or shortness of breath.  An adenosine Cardiolite was performed on November 21, 1999.  Imaging showed an ejection fraction of 63%, without signs of ischemia.  After  reviewing the chart, Dr. Donnelly Angelica felt that he could be discharge home.  DISCHARGE DIAGNOSIS:  Noncardiac chest discomfort.  DISPOSITION:  He is discharged home.  DISCHARGE MEDICATIONS: 1. He is asked to continue his home medications including:  Metoprolol    50 mg one b.i.d. 2. Aspirin 325 mg q.d. 3. Sublingual nitroglycerin p.r.n. 4. Serevent inhaler, one puff q. bedtime. 5. Hyoscyamine p.r.n. 6. Axid as before.  ACTIVITIES:  Were not restricted.  DIET:  He is asked to maintain a low-salt, low-fat diet.  FOLLOWUP:  He will see Dr. Alric Ran physicians assistant, Margarita Sermons, P.A.-C., on December 08, 1999, at 10:15 a.m.  He will see Dr. Heinz Knuckles. Norins as needed. DD:  11/21/99 TD:  11/21/99 Job: [id number]

## 2010-05-26 NOTE — Cardiovascular Report (Signed)
[place]. [facility]  Patient:    [name], [name]                   MRN: [id number] Proc. Date: 07/07/99 Adm. Date:  [id number] Attending:  Ernestine Mcmurray CC:         Terald Sleeper, M.D.             Loretha Brasil. Lia Foyer, M.D. [facility]             Michael E. Norins, M.D. [facility]             CV Laboratory                        Cardiac Catheterization  INDICATIONS:  [name] underwent cardiac catheterization by Dr. Dannielle Burn for unstable angina.  This revealed a critical stenosis in the mid right coronary artery as well as a tight posterior descending artery lesion.  There were also lesions in the distal right coronary artery as well as the posterolateral system with the bifurcation stenosis of about 70-75%.  There is scattered irregularities of the LAD and circumflex but no high-grade lesions. Percutaneous coronary intervention is recommended.  PROCEDURES PERFORMED: 1. Percutaneous stenting of the mid right coronary artery. 2. Percutaneous stenting of the proximal posterior descending artery.  DESCRIPTION OF PROCEDURE:  We discussed the case with [name] in the catheterization laboratory.  He wanted to proceed.  Risks were discussed.  The 6 French sheath was exchanged for a 7 French sheath and heparin and Integrilin were given according to protocol.  Labetalol was given for blood pressure control as was nitroglycerin.  A JR4 guiding catheter was used.  We carefully crossed the mid lesion, which was slightly difficult to cross with a 0.014 Hi-Torque Floppy wire.  Following this, the vessel was predilated using a 3.25 mm Quantum Ranger balloon.  The lesion was then stented using a 4 x 18 mm NIR stent.  There was excellent angiographic appearance after the stenting procedure.  This was taken to about 12-13 atmospheres.  There was marked improvement in the appearance of the artery.  We then placed the wire across the posterior descending artery and directly  stented the proximal PDA with a 2.5 x 12 Medtronic AVE S7 stent.  There was dramatic improvement in the appearance of the vessel.  We elected to leave the distal right coronary segment as well as the posterior bifurcation alone.  The procedure was completed without complication.  Because of some slight oozing of the sheath we exchanged the sheath for an 8 French sheath.  HEMODYNAMIC DATA:  The central aortic pressure 194/89.  This gradually improved with treatment.  ANGIOGRAPHIC DATA:  The right coronary artery demonstrates multiple areas of luminal irregularity.  There is perhaps 20% narrowing near the junction of the proximal and mid vessel.  Following this, there is a 90-95% stenosis in the mid vessel.  Following stenting this was reduced to 0%.  There is some ectasia distally.  There is about 40-50% narrowing of the distal vessel just prior to the bifurcation of the PDA and posterolateral system.  The PDA itself has a 90% proximal stenosis.  After stenting, this was reduced to 0%.  There is a second first posterolateral branch with the AV continuation branch and there is a complex 70-75% stenosis at this site.  We elected to leave this area alone.  The posterolateral branch is moderate in size.  CONCLUSIONS: 1. Successful  percutaneous intervention of the mid right coronary artery. 2. Successful percutaneous intervention of the posterior descending branch.  DISPOSITION:  The patient will be treated with aspirin and Plavix.  Followup will be with Dr. Dannielle Burn and Dr. Linda Hedges. DD:  07/07/99 TD:  07/08/99 Job: [id number]

## 2010-05-26 NOTE — Cardiovascular Report (Signed)
[place]. [facility]  Patient:    [name], [name]                   MRN: [id number] Proc. Date: 07/07/99 Adm. Date:  [id number] Disc. Date: [id number] Attending:  Rosezetta Schlatter CC:         [place] Norins, M.D. [facility]   Cardiac Catheterization  PROCEDURES PERFORMED: 1.  Left heart catheterization. 2.  Ventriculography.  FINDINGS: 1.  Two-vessel coronary artery disease with high grade stenosis of the mid     right coronary artery, as well as proximal portion of the posterior     descending artery. 2.  Normal left ventricular systolic function. 3.  Mildly elevated left ventricular end-diastolic pressure.  HISTORY:  Mr. [name] is a 77 year old white male, referred by Dr. Linda Hedges due to new onset substernal chest pain.  The patient had an abnormal Cardiolite study and was referred for diagnostic catheterization to assess his coronary anatomy.  DESCRIPTION OF PROCEDURE:  After informed consent was obtained, the patient was brought to the cardiac catheterization laboratory.  One percent lidocaine was used to infiltrate the right groin and a 6-French arterial sheath was placed using the modified Seldinger technique.  Subsequently, selective coronary angiography was performed using JL-4 and JR-4 catheters.  Images were obtained in various views, using manual injections of contrast.  After selective coronary angiography, ventriculography was performed and appropriate left-sided hemodynamics were obtained.  Ventriculography was performed using power injections of contrast.  At the termination of the case, the catheters were removed, but the arterial sheath was left in place and the patient was referred for a percutaneous coronary intervention.  No complications were noted during the procedure.  HEMODYNAMICS:  Left ventricular pressure 168/19 mmHg.  Aortic pressure 167/84 mmHg.  VENTRICULOGRAPHY:  No wall motion abnormalities.  Ejection fraction  60%.  SELECTIVE CORONARY ANGIOGRAPHY: 1.  The left main coronary artery was a large caliber vessel with no flow     limiting coronary artery disease. 2.  The left anterior descending artery had a diffuse 10% to 20% proximal     stenosis.  The remainder of the vessel was free of disease apart from     the more distal aspect of this vessel, which had 70% stenosis. 3.  The left circumflex coronary artery was a large caliber vessel with no     evidence of flow limiting coronary artery disease.  The obtuse marginal     branches were free of disease. 4.  The right coronary artery had a 99% focal stenosis in the mid vessel.     More distally, just prior to the takeoff of the posterior descending     artery, there was a 50% stenosis.  The ostium of the posterior descending     artery itself had an 80% stenosis.  A small posterolateral branch had     evidence of 60% focal stenosis.  CONCLUSIONS: 1.  Two-vessel coronary artery disease with a high grade stenosis of the     mid right coronary artery, as well as the ostium of the posterior     descending artery. 2.  Normal left ventricular contraction.  RECOMMENDATIONS:  The results have been reviewed with Dr. Lia Foyer.  The patient has been notified about these results.  The plan is to proceed with percutaneous coronary intervention to the mid right coronary artery and the proximal posterior descending artery. DD:  11/09/99 TD:  11/09/99 Job: [id number]

## 2010-06-01 ENCOUNTER — Encounter: Payer: Self-pay | Admitting: Internal Medicine

## 2010-06-02 ENCOUNTER — Ambulatory Visit (INDEPENDENT_AMBULATORY_CARE_PROVIDER_SITE_OTHER): Payer: Medicare Other | Admitting: Internal Medicine

## 2010-06-02 ENCOUNTER — Other Ambulatory Visit (INDEPENDENT_AMBULATORY_CARE_PROVIDER_SITE_OTHER): Payer: Medicare Other

## 2010-06-02 ENCOUNTER — Encounter: Payer: Self-pay | Admitting: Internal Medicine

## 2010-06-02 VITALS — BP 96/58 | HR 63 | Temp 97.4°F | Wt 181.0 lb

## 2010-06-02 DIAGNOSIS — R609 Edema, unspecified: Secondary | ICD-10-CM

## 2010-06-02 LAB — COMPREHENSIVE METABOLIC PANEL
ALT: 17 U/L (ref 0–53)
AST: 20 U/L (ref 0–37)
Albumin: 3.9 g/dL (ref 3.5–5.2)
Calcium: 9.4 mg/dL (ref 8.4–10.5)
Chloride: 109 mEq/L (ref 96–112)
Potassium: 5.3 mEq/L — ABNORMAL HIGH (ref 3.5–5.1)

## 2010-06-02 MED ORDER — FUROSEMIDE 20 MG PO TABS: 20.0000 mg | ORAL_TABLET | Freq: Every day | ORAL | Status: DC

## 2010-06-02 NOTE — Patient Instructions (Signed)
peripheral edema - will order studies to evaluate renal (kidney) function and to rule out heart as a cause. Once you have done the studies please start furosemide 20mg  once a day. We need to watch your blood pressure - don't want it to go to low.    Peripheral Edema You have swelling in your legs (peripheral edema). This swelling is due to excess accumulation of salt and water in your body. Edema may be a sign of heart, kidney or liver disease, or a side effect of a medication. It may also be due to problems in the leg veins. Elevating your legs and using special support stockings may be very helpful, if the cause of the swelling is due to poor venous circulation. Avoid long periods of standing, whatever the cause. Treatment of edema depends on identifying the cause. Chips, pretzels, pickles and other salty foods should be avoided. Restricting salt in your diet is almost always needed. Water pills (diuretics) are often used to remove the excess salt and water from your body via urine. These medicines prevent the kidney from reabsorbing sodium. This increases urine flow. Diuretic treatment may also result in lowering of potassium levels in your body. Potassium supplements may be needed if you have to use diuretics daily. Daily weights can help you keep track of your progress in clearing your edema. You should call your caregiver for follow up care as recommended. SEEK IMMEDIATE MEDICAL CARE IF:  There is markedly increased swelling, pain, redness or heat in your legs.   You develop shortness of breath, especially when lying down.   You develop chest or abdominal pain, weakness or fainting.   You have an oral temperature above 102 F (38.9 C), not controlled by medicine.  Document Released: 02/02/2004 Document Re-Released: 01/16/2009 [facility] Patient Information 2011 [employer].

## 2010-06-05 ENCOUNTER — Encounter: Payer: Self-pay | Admitting: Internal Medicine

## 2010-06-05 ENCOUNTER — Telehealth: Payer: Self-pay | Admitting: Internal Medicine

## 2010-06-05 DIAGNOSIS — I872 Venous insufficiency (chronic) (peripheral): Secondary | ICD-10-CM | POA: Insufficient documentation

## 2010-06-05 HISTORY — DX: Venous insufficiency (chronic) (peripheral): I87.2

## 2010-06-05 NOTE — Progress Notes (Signed)
Subjective:    Patient ID: [name], male    DOB: 1926-12-15, 77 y.o.   MRN: [id number]  HPI [name] is well known to the practice and followed for CAD, HTN, Hyperlipidemia and a  history of renal insufficiency. He presents for evaluation of new on-set of bilateral LE edema. There is no change in the edema over night. He has had no leg pain. He has no long term travel or other periods of inactivity. He denies leg pain, shortness of breath, GU symptoms. He has seen no improvement with elevation of his legs.  He has had no chest pain. He is current with his cardiologist and has been stable.   Past Medical History  Diagnosis Date  . Prostate cancer   . Hypertension   . CAD (coronary artery disease)     S/P stenting to mid RCA, prox PDA 06/1999;  06/2007 Myoview: negative except for apical thinning, EF 68%  . Renal insufficiency   . Exertional angina     Resolved  . Dyslipidemia   . Neck injury     C3-C4 and C4-C5 foraminal narrowing, severe  . Renal artery stenosis     50% by cath   Past Surgical History  Procedure Date  . S/p ptca   . Cardia catherization 07-07-99  . Peripheral vascular catherization 11-24-03  . Renal circulation 10-01-03  . Stress cardiolite 05-04-05    spring 09-negative except for apical thinning, EF 68%  . Edg 07-17-1994  . Flexible sigmoidoscopy 11-03-1997  . Prostatectomy   . Lumbar spinal disk and neck fusion surgery   . Stents     X 2   Family History  Problem Relation Age of Onset  . Coronary artery disease Father   . Cancer Brother     Bladder  . Diabetes Neg Hx    History   Social History  . Marital Status: Married    Spouse Name: N/A    Number of Children: 2  . Years of Education: N/A   Occupational History  . Electronics     Retired  . 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 Smoking status: Never Smoker   . Smokeless tobacco: Not on file  . Alcohol Use: Not on file   . Drug Use: Not on file  . Sexually Active: Not on file   Other Topics Concern  . Not on file   Social History Narrative   HSG, 1 year college.  married '52 - 3 years, divorced; married '56 - 3 years divorced; married '63-12 yrs - divorced; married '75 -. 1 son '57; 1 daughter - '53; 1 grandchild.  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  Very happily married.  End of life care: yes CPR, no long term mechanical ventilation, no heroic measures.       Review of Systems Review of Systems  Constitutional:  Negative for fever, chills, activity change and unexpected weight change.  HENT:  Negative for hearing loss, ear pain, congestion, neck stiffness and postnasal drip.   Eyes: Negative for pain, discharge and visual disturbance.  Respiratory: Negative for chest tightness and wheezing.   Cardiovascular: Negative for chest pain and palpitations.       [No decreased exercise tolerance Gastrointestinal: [No change in bowel habit. No bloating or gas. No reflux or indigestion Genitourinary: Negative for urgency, frequency, flank pain and difficulty  urinating.  Musculoskeletal: Negative for myalgias, back pain, arthralgias and gait problem.  Neurological: Negative for dizziness, tremors, weakness and headaches.  Hematological: Negative for adenopathy.  Psychiatric/Behavioral: Negative for behavioral problems and dysphoric mood.       Objective:   Physical Exam Vitals reviewed./ Gen'l WNWD white man in no distress HEENT C&S clear Neck - supple, no JVD Chest - good breath sounds with no rales, wheezes, increased work of breathing Cor - RRR, no murmurs Ext - 2+ pitting edema to 5 cm below the knee. No deformities noted. Skin - in tact with no stasis ulcers or stasis dermatatis.         Assessment & Plan:  1. Peripheral edema - pattern is not c/w venous insufficiency. He is at risk for cardiac and renal related edema although he has no  clinical evidence of active heart failure.  Plan - 24 hour Urine for creatinine clearance and total protein.            Metabolic panel and BNP            No medical therapy pending lab results.

## 2010-06-05 NOTE — Telephone Encounter (Signed)
Please call patient: intial labs are fine: kidney function is around your baseline, BNP is normal. 24 hr urines pending.

## 2010-06-06 ENCOUNTER — Other Ambulatory Visit: Payer: Medicare Other

## 2010-06-06 ENCOUNTER — Ambulatory Visit (INDEPENDENT_AMBULATORY_CARE_PROVIDER_SITE_OTHER): Payer: Medicare Other | Admitting: Internal Medicine

## 2010-06-06 VITALS — BP 82/50

## 2010-06-06 DIAGNOSIS — I951 Orthostatic hypotension: Secondary | ICD-10-CM

## 2010-06-06 DIAGNOSIS — R609 Edema, unspecified: Secondary | ICD-10-CM

## 2010-06-06 LAB — LIPID PANEL
Cholesterol: 121 mg/dL (ref 0–200)
HDL: 34.2 mg/dL — ABNORMAL LOW (ref >39.00)
LDL Cholesterol: 68 mg/dL (ref 0–99)
VLDL: 19.2 mg/dL (ref 0.0–40.0)

## 2010-06-07 ENCOUNTER — Telehealth: Payer: Self-pay | Admitting: Internal Medicine

## 2010-06-07 ENCOUNTER — Telehealth: Payer: Self-pay | Admitting: *Deleted

## 2010-06-07 LAB — PROTEIN, URINE, 24 HOUR: Protein, 24H Urine: 195 mg/d — ABNORMAL HIGH (ref 50–100)

## 2010-06-07 LAB — CREATININE CLEARANCE, URINE, 24 HOUR: Creatinine, Urine: 124 mg/dL

## 2010-06-07 NOTE — Telephone Encounter (Signed)
Please call patient: 24 hr urine reveals normal creatinine clearance of 14ml/min and minimally elevated protein at 195 (nl is up to 100). Forward labs to his nephrologist. Marina Gravel!!

## 2010-06-07 NOTE — Telephone Encounter (Signed)
B12 has fallen - need to restart B12 replacement. BNP 55 - no evidence of heart failure.  For peripheral edema: no evidence heart or renal cause. Treatment - no additional lab; use of otc knee high support hose.   Thanks

## 2010-06-07 NOTE — Telephone Encounter (Signed)
Pt wants to know if he should start back on his lasix. He states he was taken off at visit until his labs came back. Please advise

## 2010-06-07 NOTE — Telephone Encounter (Signed)
Before taking lasix would like to know that his BP is consistently greater than 90, and preferrably greater than 100

## 2010-06-07 NOTE — Telephone Encounter (Signed)
Informed of results.  

## 2010-06-07 NOTE — Progress Notes (Signed)
  Subjective:    Patient ID: [name], male    DOB: 08-03-1926, 77 y.o.   MRN: [id number]  HPI [name] is seen acutely for hypotension and an episode of light-headedness this AM when he got out of the car. His immediate BP showed a marked orthostatic drop. He reports that he has a heavy feeling in his head that is hard to describe. He denies diploplia, focal weakness or paresthesia, clouded mentation. He denies chest pain or palpitations. He was not pre-syncopal. He has been running low blood pressures for some time.  I have reviewed the patient's medical history in detail and updated the computerized patient record.     Review of Systems Review of Systems  Constitutional:  Negative for fever, chills, activity change and unexpected weight change.  HENT:  Negative for hearing loss, ear pain, congestion, neck stiffness and postnasal drip.   Eyes: Negative for pain, discharge and visual disturbance.  Respiratory: Negative for chest tightness and wheezing.   Cardiovascular: Negative for chest pain and palpitations.       [No decreased exercise tolerance Gastrointestinal: [No change in bowel habit. No bloating or gas. No reflux or indigestion Genitourinary: Negative for urgency, frequency, flank pain and difficulty urinating.  Musculoskeletal: Negative for myalgias, back pain, arthralgias and gait problem.  Neurological: Negative for dizziness, tremors, weakness and headaches.  Hematological: Negative for adenopathy.  Psychiatric/Behavioral: Negative for behavioral problems and dysphoric mood.       Objective:   Physical Exam Vitals noted: on my exam BP 96/60 sitting, 95/58 standing Gen'l- WNWD white male in no distress HEENT - normal  Chest - clear and normal respirations Cor - RRR Neuro - non focal       Assessment & Plan:  1. Orthostatic hypotension - in a setting of low BP he has had a positional drop in BP that is symptomatic.  Paln - reduce Norvasc to 5 mg.          If the above is tolerate and BP remains low will reduce beta blocker

## 2010-06-08 ENCOUNTER — Ambulatory Visit (INDEPENDENT_AMBULATORY_CARE_PROVIDER_SITE_OTHER): Payer: Medicare Other | Admitting: *Deleted

## 2010-06-08 DIAGNOSIS — E538 Deficiency of other specified B group vitamins: Secondary | ICD-10-CM

## 2010-06-08 MED ORDER — CYANOCOBALAMIN 1000 MCG/ML IJ SOLN
1000.0000 ug | Freq: Once | INTRAMUSCULAR | Status: AC
  Administered 2010-06-08: 1000 ug via INTRAMUSCULAR

## 2010-06-08 NOTE — Telephone Encounter (Signed)
Informed pt her will keep a record of his BP readings

## 2010-06-12 ENCOUNTER — Telehealth: Payer: Self-pay | Admitting: *Deleted

## 2010-06-12 NOTE — Telephone Encounter (Signed)
Definitely ok to forward to [place], [place] and [place]

## 2010-06-12 NOTE — Telephone Encounter (Signed)
Patient requesting that last labs be sent to Dr Justin Mend, [place] (in our system correct) and Dr Risa Grill. OK?

## 2010-06-12 NOTE — Telephone Encounter (Signed)
Done,  Patient informed

## 2010-07-03 ENCOUNTER — Telehealth: Payer: Self-pay | Admitting: *Deleted

## 2010-07-03 MED ORDER — CLOTRIMAZOLE-BETAMETHASONE 1-0.05 % EX CREA: 1.0000 "application " | TOPICAL_CREAM | Freq: Two times a day (BID) | CUTANEOUS | Status: DC | PRN

## 2010-07-03 MED ORDER — METOPROLOL SUCCINATE ER 50 MG PO TB24: 50.0000 mg | ORAL_TABLET | Freq: Every day | ORAL | Status: DC

## 2010-07-03 MED ORDER — NITROGLYCERIN 0.4 MG SL SUBL: 0.4000 mg | SUBLINGUAL_TABLET | SUBLINGUAL | Status: DC | PRN

## 2010-07-03 NOTE — Telephone Encounter (Signed)
Pt needs toprol XL, lotrisone cream and nitro quick to go to [facility]. Done

## 2010-07-06 ENCOUNTER — Ambulatory Visit (INDEPENDENT_AMBULATORY_CARE_PROVIDER_SITE_OTHER): Payer: Medicare Other

## 2010-07-06 ENCOUNTER — Telehealth: Payer: Self-pay

## 2010-07-06 DIAGNOSIS — E538 Deficiency of other specified B group vitamins: Secondary | ICD-10-CM

## 2010-07-06 MED ORDER — CYANOCOBALAMIN 1000 MCG/ML IJ SOLN
1000.0000 ug | Freq: Once | INTRAMUSCULAR | Status: AC
  Administered 2010-07-06: 1000 ug via INTRAMUSCULAR

## 2010-07-06 NOTE — Telephone Encounter (Addendum)
Error

## 2010-07-10 ENCOUNTER — Ambulatory Visit (INDEPENDENT_AMBULATORY_CARE_PROVIDER_SITE_OTHER): Payer: Medicare Other | Admitting: Internal Medicine

## 2010-07-10 ENCOUNTER — Encounter: Payer: Self-pay | Admitting: Internal Medicine

## 2010-07-10 DIAGNOSIS — R05 Cough: Secondary | ICD-10-CM

## 2010-07-10 MED ORDER — PROMETHAZINE-CODEINE 6.25-10 MG/5ML PO SYRP: 5.0000 mL | ORAL_SOLUTION | ORAL | Status: AC | PRN

## 2010-07-10 MED ORDER — BENZONATATE 100 MG PO CAPS: 100.0000 mg | ORAL_CAPSULE | Freq: Three times a day (TID) | ORAL | Status: DC | PRN

## 2010-07-10 NOTE — Patient Instructions (Signed)
Cough - no sign of bacterial or even viral infection. Suspect an allergen as trigger leading to a cyclical cough with tracheal irritation. Plan - tesslon perles three times a day, during the day take robitussin DM or generic equivalent, at bedtime take the phenergan with codeine. If this doesn't get the cough under good control in 24-48 hours let me know and I will eScribe a short course of prednisone.  Blood pressue - a little low today. It is ok to use the lasix (furosemide) as needed for a systolic blood pressure that is greater than 130 or if you get any fluid retention.   Cough The body has a normal cough reflex. This helps expel mucous secretions and irritants from the lung and airway. Coughing helps to protect you from pneumonia. Most coughs are caused by virus infections. These often take 2 to 3 weeks to clear up. Cough spasms are periods of continuous coughing that go on for several minutes. If a cough can be controlled with medicine, and it clears up in 2 to 3 weeks, no special studies or treatment is usually needed. A persistent cough lasting longer than 3 to 4 weeks requires medical evaluation. X-rays and other tests may be needed to determine the cause. A chronic cough is most often due to smoking, post-nasal drip from sinus disease, or asthma. Esophageal reflux disease or GERD can also lead to a chronic cough. ACE inhibitor blood pressure drugs may also cause a cough. Some infections like whooping cough can cause a persistent cough that lasts for weeks. Treatment of cough includes measures to loosen the cough and thin the mucus. Warm liquids, cough drops, and non prescription cough medicine will help reduce dry hacking cough. Use a humidifier if necessary to moisten the air in your room. Some cough medicines also have antihistamines, decongestants, or alcohol in them, but there is no proof that any of these help control cough. Cough suppressants may be used as directed by your caregiver. Keep  in mind that coughing helps clear mucus and infection out of the respiratory tract. It is best to use cough suppressants only when rest is needed. For children under the age of 4 years, use cough suppressants only as directed by your child's caregiver. Prescription cough medicine or those with dextromethorphan (DM) should be reserved for dry coughs that prevent sleep or cause spasms or chest pain. Avoid any exposure to cigarette smoke because this will worsen the cough or make it last much longer.   SEEK IMMEDIATE MEDICAL CARE IF YOU OR YOUR CHILD DEVELOPS:  Increased difficulty breathing.   A high fever or severe chest pain.   A cough which has not improved within 3 weeks.  Document Released: 12/25/2004 Document Re-Released: 03/21/2009 [facility] Patient Information 2011 [place].

## 2010-07-10 NOTE — Progress Notes (Signed)
  Subjective:    Patient ID: [name], male    DOB: [birth year], 77 y.o.   MRN: [id number]  HPI Mr. Bubolz presnets with a 48+ hour h/o non-productive irritative cough, no fever, chills or sputum production. No SOB/DOE. He has had cough at night which interferes with sleep. He has use tessalon perles in the past with good results.  PMH, FamHx and SocHx reviewed for any changes and relevance.    Review of Systems  Constitutional: Negative for fever, chills, activity change and fatigue.  HENT: Negative.   Eyes: Negative.   Respiratory: Positive for cough. Negative for chest tightness, shortness of breath and wheezing.   Cardiovascular: Negative.   Neurological: Negative.   Hematological: Negative.        Objective:   Physical Exam Vitals reeviewed - and normal except for low BP HEENT - C&S clear, throat clear Chest - CTAP in all lung fields Cor - RRR       Assessment & Plan:

## 2010-07-11 NOTE — Assessment & Plan Note (Signed)
[name] presents with recurrent cough without signs of infection c/w tracheal irritation  Plan - tessalon perles 100 mg tid x 10 days           Cough syrup: robitussin DM daytime and phenergan with codeine qhs.            Call if no relief - will give shourt course of prednisone.

## 2010-07-13 ENCOUNTER — Ambulatory Visit (INDEPENDENT_AMBULATORY_CARE_PROVIDER_SITE_OTHER): Payer: Medicare Other | Admitting: Internal Medicine

## 2010-07-13 ENCOUNTER — Encounter: Payer: Self-pay | Admitting: Internal Medicine

## 2010-07-13 ENCOUNTER — Other Ambulatory Visit: Payer: Self-pay | Admitting: *Deleted

## 2010-07-13 DIAGNOSIS — R059 Cough, unspecified: Secondary | ICD-10-CM

## 2010-07-13 DIAGNOSIS — R05 Cough: Secondary | ICD-10-CM

## 2010-07-13 MED ORDER — PREDNISONE 10 MG PO TABS: 10.0000 mg | ORAL_TABLET | Freq: Every day | ORAL | Status: AC

## 2010-07-13 NOTE — Assessment & Plan Note (Signed)
Persistent cough. No offending drugs, i.e. ACE-I, ARBs, other. He has been taking nexium 40mg  q AM since December. Still think of this as a cyclical cough.  Plan - continue tessalon perles, nexium and phenergan/codiene           Prednisone burst and taper.           If cough persists - CXR and referral to pulmonary.

## 2010-07-13 NOTE — Progress Notes (Signed)
  Subjective:    Patient ID: [name], male    DOB: 1926/11/04, 77 y.o.   MRN: [id number]  HPI [name] presents for persistent cough despite tessalon perles and phenergan/codiene. He will have paroxysms that can last 20 minutes. He does produce a clear sputum. He has had no SOB, fever or other signs of infection and no signs of CHF.  I have reviewed the patient's medical history in detail and updated the computerized patient record.    Review of Systems  Constitutional: Negative for fever, chills and activity change.  HENT: Negative.   Eyes: Negative.   Respiratory: Positive for cough. Negative for shortness of breath, wheezing and stridor.   Cardiovascular: Negative.   Neurological: Negative.   Hematological: Negative.        Objective:   Physical Exam Vitals noted 0 good oxygen saturation Gen'l - WNWD white man looking younger than his stated age. HEENT - nl Pulmonary - no increased work of breathing, lungs - clear to A&P       Assessment & Plan:

## 2010-07-13 NOTE — Telephone Encounter (Signed)
Pt seen by MD today.

## 2010-07-13 NOTE — Telephone Encounter (Signed)
Pt says cough has not improved - he says MD discuss possibly changing RX if no improvement.

## 2010-07-13 NOTE — Telephone Encounter (Signed)
Plan was for prednisone burst and taper - order pending: need local pharmacy. Continue with tessalon perles and cough syrup.

## 2010-07-17 ENCOUNTER — Telehealth: Payer: Self-pay | Admitting: *Deleted

## 2010-07-17 DIAGNOSIS — R05 Cough: Secondary | ICD-10-CM

## 2010-07-17 NOTE — Telephone Encounter (Signed)
Pt left vm - He had a "rough" weekend, prednisone has not helped symptoms.

## 2010-07-17 NOTE — Telephone Encounter (Signed)
Scheduled w/pulmonary MD tomorrow.

## 2010-07-17 NOTE — Telephone Encounter (Signed)
Thanks to all for prompt service - [place].

## 2010-07-17 NOTE — Telephone Encounter (Signed)
Refer to pulmonary - order entered but we may need to facilitate

## 2010-07-18 ENCOUNTER — Telehealth: Payer: Self-pay | Admitting: Pulmonary Disease

## 2010-07-18 ENCOUNTER — Ambulatory Visit (INDEPENDENT_AMBULATORY_CARE_PROVIDER_SITE_OTHER)
Admission: RE | Admit: 2010-07-18 | Discharge: 2010-07-18 | Disposition: A | Discharge status: Home / Self Care | Payer: Medicare Other | Source: Ambulatory Visit | Attending: Pulmonary Disease | Admitting: Pulmonary Disease

## 2010-07-18 ENCOUNTER — Encounter: Payer: Self-pay | Admitting: Pulmonary Disease

## 2010-07-18 ENCOUNTER — Ambulatory Visit (INDEPENDENT_AMBULATORY_CARE_PROVIDER_SITE_OTHER): Payer: Medicare Other | Admitting: Pulmonary Disease

## 2010-07-18 VITALS — BP 106/60 | HR 66 | Temp 97.6°F | Ht 72.0 in | Wt 178.4 lb

## 2010-07-18 DIAGNOSIS — R05 Cough: Secondary | ICD-10-CM

## 2010-07-18 DIAGNOSIS — R053 Chronic cough: Secondary | ICD-10-CM

## 2010-07-18 MED ORDER — HYDROCOD POLST-CPM POLST ER 10-8 MG PO CP12: 1.0000 | ORAL_CAPSULE | Freq: Two times a day (BID) | ORAL | Status: DC | PRN

## 2010-07-18 MED ORDER — BENZONATATE 100 MG PO CAPS: 200.0000 mg | ORAL_CAPSULE | Freq: Four times a day (QID) | ORAL | Status: DC | PRN

## 2010-07-18 NOTE — Assessment & Plan Note (Signed)
The pt has a chronic cough that sounds more upper airway in origin than anything else.  I suspect he has a significant cyclical mechanism to his cough, and cannot exclude a contribution from PND or LPR.  Will check a cxr for completeness, and will check spirometry if he continues to have symptoms.  Will start treating with the cyclical cough protocol, and have given him an instruction sheet.  He has an intolerance to narcotics, but it will be difficult to resolve his cough without them.  He is taking codeine currently without success, but no side effects.  Will start on tussicaps, and he will let me know if not able to tolerate.  I have also reviewed the behavioral techniques that can help with throat clearing.  He is to let me know how things are going in 2 weeks.

## 2010-07-18 NOTE — Patient Instructions (Signed)
See handout for cyclical cough protocol Stay on nexium for now every day Chlorpheniramine 8mg  at bedtime once you have finished the 3 days of the cyclical cough protocol No throat clearing, hard candy all day to help with this. Will check cxr today, and call you with results. Please call in 2 weeks to let me know about your progress.

## 2010-07-18 NOTE — Telephone Encounter (Signed)
Pt returned call.  [facility] [address] [facility] and will accept his insurance.  [facility]  Advised I would call [facility] to cancel the rx called in earlier and [facility]  He verbalized understanding of this.  [place], spoke with [place].  She was advised to cancel the Tussicap rx called in earlier today.  She verbalized understanding of this.  Tussicap rx phoned into [facility] [employer] who verbalized understanding.

## 2010-07-18 NOTE — Progress Notes (Signed)
  Subjective:    Patient ID: [name], male    DOB: 1926/12/25, 77 y.o.   MRN: [id number]  HPI The pt is an 77y/o male who I have been asked to see for chronic cough.  He has had intermittant episodes of persistent cough since the end of last year, but has had worsening since the end of July.  His cough is dry, and at times produces small quantities of mostly clear mucus.  He has a significant globus sensation, with continual throat clearing.  The cough is worsened with conversation, and also on lying down.  He denies aspiration symptoms.  He has PND at times with some nasal congestion, but it is not persistent.  He denies active reflux symptoms, but started on nexium about 61mos ago.  He denies any history of pulmonary issues, and has not had a recent cxr.  His cough is currently an 8/10 on the severity scale, and he does have paroxysms at times that are disabling.     Review of Systems  Constitutional: Negative for fever and unexpected weight change.  HENT: Positive for sore throat and trouble swallowing. Negative for ear pain, nosebleeds, congestion, rhinorrhea, sneezing, dental problem, postnasal drip and sinus pressure.   Eyes: Negative for redness and itching.  Respiratory: Positive for cough and shortness of breath. Negative for chest tightness and wheezing.   Cardiovascular: Positive for chest pain and leg swelling. Negative for palpitations.  Gastrointestinal: Negative for nausea and vomiting.  Genitourinary: Negative for dysuria.  Musculoskeletal: Negative for joint swelling.  Skin: Negative for rash.  Neurological: Positive for headaches.  Hematological: Does not bruise/bleed easily.  Psychiatric/Behavioral: Negative for dysphoric mood. The patient is not nervous/anxious.        Objective:   Physical Exam Constitutional:  Well developed, no acute distress  HENT:  Nares patent without discharge, mucosa inflammed, deviated septum to left  Oropharynx without exudate,  palate and uvula are normal  Eyes:  Perrla, eomi, no scleral icterus  Neck:  No JVD, no TMG  Cardiovascular:  Normal rate, regular rhythm, no rubs or gallops.  No murmurs        Intact distal pulses  Pulmonary :  Normal breath sounds, no stridor or respiratory distress   No rales, rhonchi, or wheezing  Abdominal:  Soft, nondistended, bowel sounds present.  No tenderness noted.   Musculoskeletal:  1+ lower extremity edema noted primarily in ankles.   Lymph Nodes:  No cervical lymphadenopathy noted  Skin:  No cyanosis noted  Neurologic:  Alert, appropriate, moves all 4 extremities without obvious deficit.         Assessment & Plan:

## 2010-07-18 NOTE — Telephone Encounter (Signed)
PATIENT HAS NO PRESCRIPTION AND HAS TO DEAL WITH [facility] DUE TO INSURANCE ([facility]).

## 2010-07-18 NOTE — Telephone Encounter (Signed)
[facility] called [facility] and pharmacy states they were not able to order tussicaps, but he is not sure if other pharmacy has medication. I have LMTCBx1 to advise pt and to see if he has the rx on hand, and what other pharmacy he prefers so we can check to see if they have the med. [place] Bing, CMA

## 2010-07-21 ENCOUNTER — Telehealth: Payer: Self-pay | Admitting: Pulmonary Disease

## 2010-07-21 NOTE — Telephone Encounter (Signed)
If it is in small quantity, would just follow and see how he does.  If in large quantities, or if becomes that way, would consider whether he needs an abx.

## 2010-07-21 NOTE — Telephone Encounter (Signed)
Spoke with the pt wife because the pt is doing his 3 days of silence and she states that he is now coughing up brown/gray phlegm since yesterday. She states at time of OV the phlegm was clear.  He is still on prednisone from Dr. Linda Hedges and all meds prescribed by Dr. Gwenette Greet, but they wanted to let [facility] know about the phlegm in case he had any recs for this. I advised I will forward the message. [place] Bing, CMA   Allergies  Allergen Reactions  . Advicor   . Amoxicillin   . Aspirin     REACTION: upset stomach  . Atarax (Hydroxyzine Hcl)   . Ciprofloxacin   . Codeine     REACTION: Stomach upset  . Hydrocodone   . Niacin   . Nitrofurantoin   . Trimox (Amoxicillin Trihydrate)

## 2010-07-21 NOTE — Telephone Encounter (Signed)
Spoke with the pt wife and she states the amount of the phlegm is a small amount and has not increased, so I advsied to just keep a watch on it and if the amount increases to a large amount then to let us know.[place] Bing, CMA

## 2010-07-24 ENCOUNTER — Telehealth: Payer: Self-pay | Admitting: Pulmonary Disease

## 2010-07-24 ENCOUNTER — Other Ambulatory Visit: Payer: Self-pay | Admitting: Pulmonary Disease

## 2010-07-24 DIAGNOSIS — R05 Cough: Secondary | ICD-10-CM

## 2010-07-24 NOTE — Telephone Encounter (Signed)
Spoke with pt and notified of KC's recs. He states that this is fine, wants to go ahead and have scan done. Will forward to [facility] to order.

## 2010-07-24 NOTE — Telephone Encounter (Signed)
Let him know that I would like to image his sinuses and see if this mucus is coming from a chronic sinusitis.  If it is, would put on abx and send to ENT (since it will take a period of time to get into ENT).  If the scan of sinuses is negative, will look in another direction.  See if he is ok with scan of sinuses.

## 2010-07-24 NOTE — Telephone Encounter (Signed)
Pt says his cough is no worse but has not improved any. Phlegm is gray to brown in color (has not incr in amount) and he only has 1 tab left on his Prednisone. Pt also c/o sob at times. He wants to know if he needs an abx or referral to ENT. Pls advise.

## 2010-07-24 NOTE — Telephone Encounter (Signed)
Order sent to [facility]

## 2010-07-25 ENCOUNTER — Ambulatory Visit (INDEPENDENT_AMBULATORY_CARE_PROVIDER_SITE_OTHER)
Admission: RE | Admit: 2010-07-25 | Discharge: 2010-07-25 | Disposition: A | Discharge status: Home / Self Care | Payer: Medicare Other | Source: Ambulatory Visit | Attending: Pulmonary Disease | Admitting: Pulmonary Disease

## 2010-07-25 DIAGNOSIS — R05 Cough: Secondary | ICD-10-CM

## 2010-07-27 ENCOUNTER — Telehealth: Payer: Self-pay | Admitting: Pulmonary Disease

## 2010-07-27 NOTE — Telephone Encounter (Signed)
Jinny Blossom spoke with pt and advised of results and set pt for an appt tomorrow. See CT result note. [place] Bing, CMA

## 2010-07-28 ENCOUNTER — Encounter: Payer: Self-pay | Admitting: Pulmonary Disease

## 2010-07-28 ENCOUNTER — Ambulatory Visit (INDEPENDENT_AMBULATORY_CARE_PROVIDER_SITE_OTHER): Payer: Medicare Other | Admitting: Pulmonary Disease

## 2010-07-28 VITALS — BP 104/58 | HR 75 | Temp 98.1°F | Ht 72.0 in | Wt 175.8 lb

## 2010-07-28 DIAGNOSIS — R053 Chronic cough: Secondary | ICD-10-CM

## 2010-07-28 DIAGNOSIS — R05 Cough: Secondary | ICD-10-CM

## 2010-07-28 MED ORDER — TRAMADOL HCL 50 MG PO TABS: 50.0000 mg | ORAL_TABLET | Freq: Four times a day (QID) | ORAL | Status: AC | PRN

## 2010-07-28 NOTE — Progress Notes (Signed)
  Subjective:    Patient ID: [name], male    DOB: 09-26-1926, 77 y.o.   MRN: [id number]  HPI The pt comes in today for f/u of his chronic cough.  At the last visit, this was felt to be most c/w a cyclical cough, and he was started on a cyclical cough protocol.  He feels this helped only a little, and underwent ct sinuses which showed minimal thickening in sphenoid, but no a/f levels.   The pt currently has a cough at 5-8/10, and is continuing to clear his throat constantly per wife.  He has very scant mucus, and is unsure if coming from chest or back of throat.  I suspect latter.   Review of Systems  Constitutional: Negative for fever and unexpected weight change.  HENT: Positive for sore throat and postnasal drip. Negative for ear pain, nosebleeds, congestion, rhinorrhea, sneezing, trouble swallowing, dental problem and sinus pressure.   Eyes: Negative for redness and itching.  Respiratory: Positive for cough and shortness of breath. Negative for chest tightness and wheezing.   Cardiovascular: Negative for palpitations and leg swelling.  Gastrointestinal: Positive for abdominal pain. Negative for nausea and vomiting.  Genitourinary: Negative for dysuria.  Musculoskeletal: Negative for joint swelling.  Skin: Negative for rash.  Neurological: Positive for dizziness and headaches.  Hematological: Does not bruise/bleed easily.  Psychiatric/Behavioral: Negative for dysphoric mood. The patient is not nervous/anxious.        Objective:   Physical Exam Wd male in nad Nares without purulence or discharge Oropharynx with mucus collected in back of throat. Chest totally clear Cor with rrr LE without edema, no cyanosis Alert, oriented, moves all 4        Assessment & Plan:

## 2010-07-28 NOTE — Patient Instructions (Addendum)
Increase nexium to am and pm for now Avoid throat clearing!!  Keep using hard candy, limit voice use Get chlorpheniramine 8mg  otc and take each night at bedtime (for postnasal drip/allergy) Can use tessalon pearls as directed for cough, but will also let you try tramadol 50mg  one every 6hrs if needed. Will refer to ENT for upper airway evaluation, to make sure no structural problem.  Please give me some feedback with how things are going in 2 weeks.

## 2010-07-29 ENCOUNTER — Encounter: Payer: Self-pay | Admitting: Pulmonary Disease

## 2010-07-29 NOTE — Assessment & Plan Note (Signed)
The pt continues to have a cough that most likely is upper airway in origin.  His cxr is unremarkable, his spirometry today is normal.  I am beginning to think reflux may be playing more of a role, along with his continual throat clearing.  I think he does need an upper airway evaluation by ENT to r/o structural issues, but have told him his cough will not improve if he continues to clear his throat.  I have re-iterated the behavioral therapies that will help with this.  Will also double up his PPI, and have asked him to take an antihistamine daily for completeness.  If he continues to have issues, will need to decide whether to escalate the w/u to include ct chest and possible bronchoscopy.

## 2010-08-01 ENCOUNTER — Telehealth: Payer: Self-pay | Admitting: Pulmonary Disease

## 2010-08-01 NOTE — Telephone Encounter (Signed)
lmomtcb  

## 2010-08-01 NOTE — Telephone Encounter (Signed)
Pt returned call. [name]  

## 2010-08-01 NOTE — Telephone Encounter (Signed)
Spoke with pt. He states calling to give feedback on cough per [facility] request. He states that he had "bad spell" with cough on 7/22 and had appt with ENT moved up, was seen by Dr. Redmond Baseman the following day. After his eval there, was told the problem was acid reflux, and was advised that he should continue to take nexium bid and followup there in 2 months. He states that he overall feels like the cough is improving, and mainly bothers him at night only now- still taking chlortabs at hs and taking tessalon and this seems to help some. He states wants to know how long [facility] [place] will keep him on tessalon, b/c he will run out in a few more days. Also wants to know since he has been dxed with reflux should he continue taking asa or could this make his symptoms worse. I advised that [place] out of the office this wk, but will forward him msg to address. Pt verbalized understanding.

## 2010-08-02 ENCOUNTER — Other Ambulatory Visit: Payer: Self-pay | Admitting: Pulmonary Disease

## 2010-08-03 ENCOUNTER — Ambulatory Visit: Payer: Medicare Other

## 2010-08-08 NOTE — Telephone Encounter (Signed)
Let him know that he can continue with tessalon pearls as needed if he thinks they help him.  #50, one fill Remind him to not clear his throat, use hard candy, and to limit his voice. Regarding aspirin, make sure is coated.  If cough continues may need to refer to GI for further evaluation of his reflux.  Please see if we can get the ENT note faxed over.  Thanks

## 2010-08-09 MED ORDER — BENZONATATE 100 MG PO CAPS: ORAL_CAPSULE | ORAL | Status: DC

## 2010-08-09 NOTE — Telephone Encounter (Signed)
Spoke with pt and notified of recs per [facility]. Pt verbalized understanding and states that he does want the refill for tessalon, and rx was sent to pharm for this. Called [facility] and they are closed at this time, so WCB to get the note faxed later.

## 2010-08-09 NOTE — Telephone Encounter (Signed)
Note to be faxed to the triage faxline

## 2010-08-11 ENCOUNTER — Telehealth: Payer: Self-pay | Admitting: Pulmonary Disease

## 2010-08-11 NOTE — Telephone Encounter (Signed)
Refill was already sent on 08/09/10-LMTCB

## 2010-08-14 ENCOUNTER — Telehealth: Payer: Self-pay | Admitting: Pulmonary Disease

## 2010-08-14 MED ORDER — BENZONATATE 100 MG PO CAPS: ORAL_CAPSULE | ORAL | Status: DC

## 2010-08-14 NOTE — Telephone Encounter (Signed)
Duplicate message.  Will sign off and document in 8.3.12 phone note.

## 2010-08-14 NOTE — Telephone Encounter (Signed)
Pt returned call per 8.6.12 phone note.  Per EMR rx was sent on 8.1.43.    Called spoke with pharmacist at [facility] who reports that they received the 7.26.12 rx but not the 8.1.12 rx.  Refill given verbally to pharmacist.  [facility] informing patient that his rx has been telephoned to the pharmacy and apologized for any inconvenience.  Encouraged pt to call with any questions/concerns.  Refill updated on med list.

## 2010-08-16 ENCOUNTER — Telehealth: Payer: Self-pay | Admitting: Pulmonary Disease

## 2010-08-16 ENCOUNTER — Other Ambulatory Visit: Payer: Self-pay | Admitting: Pulmonary Disease

## 2010-08-16 DIAGNOSIS — R05 Cough: Secondary | ICD-10-CM

## 2010-08-16 DIAGNOSIS — R053 Chronic cough: Secondary | ICD-10-CM

## 2010-08-16 NOTE — Telephone Encounter (Signed)
Pt agrees to have CT chest. Send order to [facility]. Thanks.

## 2010-08-16 NOTE — Telephone Encounter (Signed)
I spoke with the [name] and he states his cough occurs less frequently but when he does cough it is much more severe then before. He states he "losses his breath" when he coughs and it take him several minutes to recover after a coughing spell.  He states his breathing is fine otherwise. [name] states he saw  Dr Redmond Baseman on 08-14-10 and he told him he saw no issue and believe cough is from acid reflux but [name] denies any symptoms. He states he is taking all meds as Dr. Gwenette Greet advised.  Please advise. [place] Bing, CMA

## 2010-08-16 NOTE — Telephone Encounter (Signed)
Order sent to [facility].

## 2010-08-16 NOTE — Telephone Encounter (Signed)
As discussed previously with him, this is almost certainly an upper airway cough.  His cxr, lung exam, and normal breathing studies make a lung issue much less likely.  However, we also discussed doing other things to evaluate further, but as we do, the yield or likelihood that we will get an answer decreases.   Would consider a ct chest to r/o things in the chest that may be missed by cxr.  Will order if he is ok with this.

## 2010-08-17 ENCOUNTER — Ambulatory Visit (INDEPENDENT_AMBULATORY_CARE_PROVIDER_SITE_OTHER)
Admission: RE | Admit: 2010-08-17 | Discharge: 2010-08-17 | Disposition: A | Discharge status: Home / Self Care | Payer: Medicare Other | Source: Ambulatory Visit | Attending: Pulmonary Disease | Admitting: Pulmonary Disease

## 2010-08-17 DIAGNOSIS — R05 Cough: Secondary | ICD-10-CM

## 2010-08-21 ENCOUNTER — Telehealth: Payer: Self-pay | Admitting: Pulmonary Disease

## 2010-08-21 NOTE — Telephone Encounter (Signed)
Per [place], pt needs ov with [place] to discuss results.  Called and spoke with pt.  Pt scheduled to see [facility] tomorrow at 4:30pm

## 2010-08-22 ENCOUNTER — Encounter: Payer: Self-pay | Admitting: Pulmonary Disease

## 2010-08-22 ENCOUNTER — Ambulatory Visit (INDEPENDENT_AMBULATORY_CARE_PROVIDER_SITE_OTHER): Payer: Medicare Other | Admitting: Pulmonary Disease

## 2010-08-22 DIAGNOSIS — R05 Cough: Secondary | ICD-10-CM

## 2010-08-22 DIAGNOSIS — R918 Other nonspecific abnormal finding of lung field: Secondary | ICD-10-CM

## 2010-08-22 MED ORDER — TRAMADOL HCL 50 MG PO TABS: 50.0000 mg | ORAL_TABLET | Freq: Four times a day (QID) | ORAL | Status: AC | PRN

## 2010-08-22 NOTE — Assessment & Plan Note (Signed)
Needs f/u ct in 82mos.

## 2010-08-22 NOTE — Assessment & Plan Note (Signed)
The patient has continued to have a chronic cough despite the cyclical cough protocol, treatment of postnasal drip, and being on b.i.d. Proton pump inhibitor.  He recently underwent a CT of the chest which showed a tiny focus of bronchiectasis in the right lower lobe with mucoid impaction, as well as a few tiny nodules.  I really do not feel this is the cause of his cough.  He continues to have frequent throat clearing and often leads to cough paroxysms.  He really is not producing mucus.  He is having more postnasal drip during the day, and therefore I would like to treat him more aggressively with astepro added to his h.s. Chlorpheniramine.  We'll also try him on tramadol for a possible neurogenic component to his cough, and if not helpful, can change this to Neurontin as a trial.

## 2010-08-22 NOTE — Patient Instructions (Addendum)
Stay on nexium am and pm for reflux Try to avoid throat clearing, use hard candy during day. Stay on chlorpheniramine 8mg  near bedtime, but try astepro nasal spray 2 each nostril each am to try and prevent postnasal drip. Tramadol 50mg  every 6hrs for cough if needed.  I would start out taking twice a day no matter what, then see how you do.  May make you sleepy. Can use tessalon pearls if needed for breakthru cough.   Limit voice use as much as possible Radiology has recommended a followup scan of your chest in 36mos to recheck on that area.

## 2010-08-22 NOTE — Progress Notes (Signed)
  Subjective:    Patient ID: [name], male    DOB: December 03, 1926, 77 y.o.   MRN: [id number]  HPI The patient comes in today for follow up of his chronic cough.  Based on his initial history and exam this was felt to be upper airway in origin.  He was initially treated with cyclical cough protocol, with chlorpheniramine for postnasal drip, and with behavioral therapies to limit throat clearing.  He was also treated with a proton pump inhibitor once a day, and then twice a day after an ENT evaluation.  He feels that his cough is 50% better from his initial visit, but when it does occur it is more severe with paroxysms.  He continues to have throat clearing according to his wife, and feels that he may be having more postnasal drip than before.  We did do a CT chest to rule out possible bronchiectasis, and the patient did have a very tiny focus deep in his right lower lobe along with a few nodules.  I do not think this is the cause of his cough.   Review of Systems  Constitutional: Negative for fever and unexpected weight change.  HENT: Positive for sore throat, rhinorrhea, sneezing and postnasal drip. Negative for ear pain, nosebleeds, congestion, trouble swallowing, dental problem and sinus pressure.   Eyes: Negative for redness and itching.  Respiratory: Positive for cough, chest tightness and shortness of breath. Negative for wheezing.   Cardiovascular: Negative for palpitations and leg swelling.  Gastrointestinal: Negative for nausea and vomiting.  Genitourinary: Negative for dysuria.  Musculoskeletal: Negative for joint swelling.  Skin: Negative for rash.  Neurological: Positive for headaches.  Hematological: Does not bruise/bleed easily.  Psychiatric/Behavioral: Negative for dysphoric mood. The patient is not nervous/anxious.        Objective:   Physical Exam Well-developed male in no acute distress Nose without purulence or discharge Oropharynx clear Chest totally clear to  auscultation, no wheezes or rhonchi Cardiac exam with regular rate and rhythm LE without edema, no cyanosis noted Alert and oriented, moves all four.       Assessment & Plan:

## 2010-08-25 ENCOUNTER — Telehealth: Payer: Self-pay | Admitting: Pulmonary Disease

## 2010-08-25 NOTE — Telephone Encounter (Signed)
Called, spoke with pt.  States the cough and SOB are no better - still having a "harsh" cough - nonprod and trouble breathing for 4-5 minutes after the cough.  He feels the cough is coming from the drainage.  Pt states he is taking the chlorpheniramine 8 mg but feels "doped up" when he is taking it.  He states even when he takes it only at bedtime he wakes up feeling "hungover."  He is also using the astepro but states this is causing nasal dryness and not helping with the drainage in the back of his throat.  He is limiting his voice - not talking unless he has too.  Taking tesalon pearles with some relief.  Took tramadol x 1 but feels the cough is coming from the drainage.  He is requesting KC's recs -- ? Should he call Dr. Gloriann Loan office.  Dr. Gwenette Greet, pls advise.  Thanks!

## 2010-08-25 NOTE — Telephone Encounter (Signed)
Let him know that we have done everything from a pulmonary perspective for his allergy/postnasal drip.  If he is continuing to have this despite antihistamine and nasal spray, will need to see an allergist over an ENT since he had no significant sinusitis.  See if he is ok with referral to an allergist.

## 2010-08-25 NOTE — Telephone Encounter (Signed)
Called, spoke with pt.  He is aware of KC's thoughts and is ok with an allergist referral.  Dr. Gwenette Greet, who would you like to refer him to?

## 2010-08-28 NOTE — Telephone Encounter (Signed)
Katie, Dr. Janee Morn first available is not until the end of Sept.  Pls advise. Thanks!

## 2010-08-28 NOTE — Telephone Encounter (Signed)
Please see if Joellen Jersey can set him up for allergy evaluation with Dr. Annamaria Boots.

## 2010-08-31 ENCOUNTER — Telehealth: Payer: Self-pay | Admitting: Pulmonary Disease

## 2010-08-31 NOTE — Telephone Encounter (Signed)
Pt called back on 08/31/10 requesting the status of the allergy eval appt.  He states cough has "reduced" some but sill strong and forceful when he does cough.  He is requesting Allergy Eval asap.  Dr. Joni Reining, pls advise if pt can be worked in.  Thanks!

## 2010-08-31 NOTE — Telephone Encounter (Signed)
Called and spoke with patient-apolgized for the wait as I have been out of the office for 2 days;appt has been scheduled for 09-08-10 at 1115am with CY and patient is satisfied with appointmentt time and date.

## 2010-08-31 NOTE — Telephone Encounter (Signed)
Duplicate phone message.  Please see phone message from 08/25/10 for additional information.  Thanks!

## 2010-09-05 ENCOUNTER — Ambulatory Visit: Payer: Medicare Other

## 2010-09-08 ENCOUNTER — Encounter: Payer: Self-pay | Admitting: Internal Medicine

## 2010-09-08 ENCOUNTER — Ambulatory Visit (INDEPENDENT_AMBULATORY_CARE_PROVIDER_SITE_OTHER): Payer: Medicare Other | Admitting: Internal Medicine

## 2010-09-08 ENCOUNTER — Other Ambulatory Visit: Payer: Self-pay | Admitting: *Deleted

## 2010-09-08 ENCOUNTER — Other Ambulatory Visit (INDEPENDENT_AMBULATORY_CARE_PROVIDER_SITE_OTHER): Payer: Medicare Other

## 2010-09-08 VITALS — BP 106/62 | HR 63 | Ht 72.0 in | Wt 178.6 lb

## 2010-09-08 DIAGNOSIS — R05 Cough: Secondary | ICD-10-CM

## 2010-09-08 DIAGNOSIS — Z23 Encounter for immunization: Secondary | ICD-10-CM

## 2010-09-08 LAB — CBC WITH DIFFERENTIAL/PLATELET
Basophils Relative: 0.5 % (ref 0.0–3.0)
Eosinophils Relative: 4.5 % (ref 0.0–5.0)
HCT: 40.8 % (ref 39.0–52.0)
Lymphs Abs: 1.3 10*3/uL (ref 0.7–4.0)
MCV: 79.2 fl (ref 78.0–100.0)
Monocytes Absolute: 0.6 10*3/uL (ref 0.1–1.0)
Platelets: 237 10*3/uL (ref 150.0–400.0)
RBC: 5.16 Mil/uL (ref 4.22–5.81)
WBC: 5.5 10*3/uL (ref 4.5–10.5)

## 2010-09-08 MED ORDER — FOLIC ACID 1 MG PO TABS: 1.0000 mg | ORAL_TABLET | Freq: Every day | ORAL | Status: DC

## 2010-09-08 NOTE — Assessment & Plan Note (Addendum)
I don't get a history of exposures to common allergens associated with this cough. Plan- allergy profile, CBC w/ diff RTC for allergy skin testing

## 2010-09-08 NOTE — Patient Instructions (Addendum)
Order- Allergy profile, CBC w/ diff     Dx cough   Schedule return for allergy skin testing- antihistamines block the skin tests, so Please- no antihistamines, including allergy , cold and sinus meds, cough syrups or otc sleep meds, for 3 days before the scheduled testing. This will include the Astepro and chlorpheniramine, and also trazodone.   Flu vax today

## 2010-09-08 NOTE — Progress Notes (Signed)
Subjective:    Patient ID: [name], male    DOB: [birth year], 77 y.o.   MRN: [id number]  HPI 09/08/10- 77 year old male never smoker referred courtesy of Dr. Gwenette Greet for allergy evaluation because of cough.  Wife is here. He describes an interval of cough in November of 2011 which resolved after 2 weeks. His wife suggests he had been coughing off and on intermittently for longer than that. He has now had bothersome cough at least 6 or 7 months but it became worse in June of 2012 and more persistent. It is worse when lying down, yawning, laughing or talking. He denies history of allergic rhinitis. He feels that something is hanging in his throat "like drainage". He is doing a little better at present, but when the cough was worse it took his breath. Occasionally coughs up scant mucus from his throat. He does not recognize wheeze. Congestion when lying on his sides. He tried Astepro nasal spray and chlorpheniramine but they made him too sleepy. He saw Dr.Bates for ENT evaluation and came away understanding exam was normal. Subsequently Dr. Redmond Baseman note described a red throat suggested reflux. There was a significant problem with reflux years ago but he does not notice it at all now. He is retired after 20 years in the [employer] and many years of international most recently in May of 2012: No concerning exposures are identified Review of Systems Constitutional:   No-   weight loss, night sweats, fevers, chills, fatigue, lassitude. HEENT:   No-  headaches, difficulty swallowing, tooth/dental problems, +sore throat,       No-  sneezing, itching, ear ache, nasal congestion, + post nasal drip,  CV:  No-   chest pain, orthopnea, PND, swelling in lower extremities, anasarca, dizziness, palpitations Resp:  + shortness of breath with exertion, not at rest.              +  productive cough,  + non-productive cough,  No- coughing up of blood.              No-   change in color of mucus.  No- wheezing.     Skin: No-   rash or lesions. GI:  No-   heartburn, indigestion, abdominal pain, nausea, vomiting, diarrhea,                 change in bowel habits, loss of appetite GU: No-   dysuria, change in color of urine, no urgency or frequency.  No- flank pain. MS:  No-   joint pain. +hands and feet swelling.  No- decreased range of motion.  No- back pain. Neuro- grossly normal to observation, Or:  Psych:  No- change in mood or affect. No depression or anxiety.  No memory loss.      Objective:   Physical Exam General- Alert, Oriented, Affect-appropriate, Distress- none acute Skin- rash-none, lesions- none, excoriation- none Lymphadenopathy- none Head- atraumatic            Eyes- Gross vision intact, PERRLA, conjunctivae clear secretions            Ears- Hearing, canals- normal            Nose- Clear, no-Septal dev, mucus, polyps, erosion, perforation             Throat- Mallampati II , mucosa clear , drainage- none, tonsils- atrophic  No redness, glandularity or drainage seen. Neck- flexible , trachea midline, no stridor , thyroid nl, carotid no bruit Chest - symmetrical excursion ,  unlabored           Heart/CV- RRR , no murmur , no gallop  , no rub, nl s1 s2                           - JVD- none , edema- none, stasis changes- none, varices- none           Lung- clear to P&A, wheeze- none, cough- none while here , dullness-none, rub- none           Chest wall-  Abd- tender-no, distended-no, bowel sounds-present, HSM- no Br/ Gen/ Rectal- Not done, not indicated Extrem- cyanosis- none, clubbing, none, atrophy- none, strength- nl Neuro- grossly intact to observation         Assessment & Plan:

## 2010-09-12 LAB — ALLERGY FULL PROFILE
Allergen,Goose feathers, e70: <0.1 kU/L (ref <0.35)
Bermuda Grass: <0.1 kU/L (ref <0.35)
Candida Albicans: <0.1 kU/L (ref <0.35)
Common Ragweed: <0.1 kU/L (ref <0.35)
Curvularia lunata: <0.1 kU/L (ref <0.35)
Elm IgE: <0.1 kU/L (ref <0.35)
Fescue: <0.1 kU/L (ref <0.35)
G005 Rye, Perennial: <0.1 kU/L (ref <0.35)
Goldenrod: <0.1 kU/L (ref <0.35)
Lamb's Quarters: <0.1 kU/L (ref <0.35)
Plantain: <0.1 kU/L (ref <0.35)
Stemphylium Botryosum: <0.1 kU/L (ref <0.35)
Sycamore Tree: <0.1 kU/L (ref <0.35)
Timothy Grass: <0.1 kU/L (ref <0.35)

## 2010-09-13 ENCOUNTER — Telehealth: Payer: Self-pay | Admitting: Internal Medicine

## 2010-09-13 NOTE — Telephone Encounter (Signed)
I put pt on for next open time-11/15/2010 at 3pm-please make sure pt knows NO antihistamines, OTC cough syrups, or OTC sleep aides for 3 days prior to appt.

## 2010-09-13 NOTE — Telephone Encounter (Signed)
PT RETURNED [place]. CALL SAME #. [name]

## 2010-09-13 NOTE — Telephone Encounter (Signed)
Called [phone] - [email or web address]

## 2010-09-13 NOTE — Telephone Encounter (Signed)
[name], have you scheduled this pt for allergy test? Please advise. [place] Bing, CMA

## 2010-09-14 ENCOUNTER — Telehealth: Payer: Self-pay | Admitting: Pulmonary Disease

## 2010-09-14 NOTE — Telephone Encounter (Signed)
Please inform patient that we ONLY do testing on the first Wed of each the month-Oct is full however if patient would like to call periodically to see if anyone has cancelled he can.

## 2010-09-14 NOTE — Telephone Encounter (Signed)
I called and left a message for patient to call and speak with me about skin testing-CY is going to be able to work a certain date to do testing for patient-I need to know if it will work for patient or not. Please give call to me. Thanks.

## 2010-09-14 NOTE — Telephone Encounter (Signed)
Pt is aware but was not happy with this at all. He says Dr. Gwenette Greet wanted this done ASAP. Pt is scheduled for 11/15/2010 for allergy testing and pt was instructed to call back periodically to see if there were any cancellations for October.

## 2010-09-14 NOTE — Telephone Encounter (Signed)
Spoke with pt and advised of date and time for allergy testing. He refuses this, states "this is very unacceptable, being that I have been coughing my head off for 10 wks". Pt upset and wants to be seen sooner. I explained to him that the allergy testing is not done every day, and so this was the first available opening to be tested. I advised I can check with Joellen Jersey and [facility] and see if there is anything else we can offer him. Please advise, thanks!

## 2010-09-26 ENCOUNTER — Telehealth: Payer: Self-pay | Admitting: *Deleted

## 2010-09-26 NOTE — Telephone Encounter (Signed)
Message taken by Charma Igo: Pt called and states he is needing a refill on benzonatate 100mg , but he is set for ALT on Monday. Mindy advised the pt of ALT protocol and that he should not take this med 3 days prior to  Appt. Pt states understanding. [place] Bing, CMA

## 2010-10-02 ENCOUNTER — Telehealth: Payer: Self-pay | Admitting: Internal Medicine

## 2010-10-02 ENCOUNTER — Encounter: Payer: Self-pay | Admitting: Internal Medicine

## 2010-10-02 ENCOUNTER — Ambulatory Visit (INDEPENDENT_AMBULATORY_CARE_PROVIDER_SITE_OTHER): Payer: Medicare Other | Admitting: Internal Medicine

## 2010-10-02 VITALS — BP 112/60 | HR 62 | Ht 72.0 in | Wt 178.4 lb

## 2010-10-02 DIAGNOSIS — R918 Other nonspecific abnormal finding of lung field: Secondary | ICD-10-CM

## 2010-10-02 DIAGNOSIS — R05 Cough: Secondary | ICD-10-CM

## 2010-10-02 MED ORDER — MONTELUKAST SODIUM 10 MG PO TABS: 10.0000 mg | ORAL_TABLET | Freq: Every day | ORAL | Status: DC

## 2010-10-02 NOTE — Progress Notes (Signed)
Subjective:    Patient ID: [name], male    DOB: 1926/06/11, 77 y.o.   MRN: [id number]  HPI  Subjective:   09/08/10- 77 year old male never smoker referred courtesy of Dr. Gwenette Greet for allergy evaluation because of cough.  Wife is here. He describes an interval of cough in November of 2011 which resolved after 2 weeks. His wife suggests he had been coughing off and on intermittently for longer than that. He has now had bothersome cough at least 6 or 7 months but it became worse in June of 2012 and more persistent. It is worse when lying down, yawning, laughing or talking. He denies history of allergic rhinitis. He feels that something is hanging in his throat "like drainage". He is doing a little better at present, but when the cough was worse it took his breath. Occasionally coughs up scant mucus from his throat. He does not recognize wheeze. Congestion when lying on his sides. He tried Astepro nasal spray and chlorpheniramine but they made him too sleepy. He saw Dr.Bates for ENT evaluation and came away understanding exam was normal. Subsequently Dr. Redmond Baseman note described a red throat suggested reflux. There was a significant problem with reflux years ago but he does not notice it at all now. He is retired after 59 years in the [employer] and many years of international most recently in May of 2012: No concerning exposures are identified  10/02/10- 77 year old male never smoker referred courtesy of Dr. Gwenette Greet for allergy evaluation because of cough.  Wife is here. Coming today for allergy testing. Off antihistamines, they notice no change in cough. He still senses postnasal drip in throat, but not anything in his chest except sometimes tussive soreness at sternum. Denies any sense of reflux, although has remote hx and Dr Redmond Baseman suspected GERD because of red throat. Nose feels stuffy when he lies on the right side. They mention recurrent boils or pimples on back intermittently for as long as they  have been married, and a recent rash on shoulders.  Being treated for recurrent UTI. Allergy skin test: Mild and trace intraepidermal reactions for some grass weed and tree pollens and dust mite.      Review of Systems  Review of Systems Constitutional:   No-   weight loss, night sweats, fevers, chills, fatigue, lassitude. HEENT:   No-  headaches, difficulty swallowing, tooth/dental problems, +sore throat,       No-  sneezing, itching, ear ache, nasal congestion, + post nasal drip,  CV:  No-   chest pain, orthopnea, PND, swelling in lower extremities, anasarca, dizziness, palpitations Resp:  + shortness of breath with exertion, not at rest.              +  productive cough,  + non-productive cough,  No- coughing up of blood.              No-   change in color of mucus.  No- wheezing.   Skin: No-   rash or lesions. GI:  No-   heartburn, indigestion, abdominal pain, nausea, vomiting, diarrhea,                 change in bowel habits, loss of appetite GU: No-   dysuria, change in color of urine, no urgency or frequency.  No- flank pain. MS:  No-   joint pain. +hands and feet swelling.  No- decreased range of motion.  No- back pain. Neuro- grossly normal to observation, Or:  Psych:  No-  change in mood or affect. No depression or anxiety.  No memory loss.       Physical Exam  Physical Exam General- Alert, Oriented, Affect-appropriate, Distress- none acute Skin- , excoriation- none. Multiple small firm nodular intradermal inclusions across shoulders- pin head sized. Lymphadenopathy- none Head- atraumatic            Eyes- Gross vision intact, PERRLA, conjunctivae clear secretions            Ears- Hearing, canals- normal            Nose- Clear, no-Septal dev, mucus, polyps, erosion, perforation             Throat- Mallampati II , mucosa clear , drainage- none, tonsils- atrophic  No redness, glandularity or drainage seen. Neck- flexible , trachea midline, no stridor , thyroid nl, carotid no  bruit Chest - symmetrical excursion , unlabored           Heart/CV- RRR- 1 extra beat , no murmur , no gallop  , no rub, nl s1 s2                           - JVD- none , edema- none, stasis changes- none, varices- none           Lung- +Crackles right base on initial deep breath noted to clear with repeated breaths without inducing cough.   dullness-none, rub- none           Chest wall-  Abd- tender-no, distended-no, bowel sounds-present, HSM- no Br/ Gen/ Rectal- Not done, not indicated Extrem- cyanosis- none, clubbing, none, atrophy- none, strength- nl

## 2010-10-02 NOTE — Patient Instructions (Addendum)
Consider the dust precaution information I gave you  Sample Nasonex  Nasal steroid spray-     2 sprays each nostril once every day at bedtime   See if drainage gets better  Script printed for Singulair- see if insurance will now cover. Consider self pay for enough to try for a couple of weeks.   If these approaches don't help, we can talk about a trial of allergy vaccine.

## 2010-10-02 NOTE — Telephone Encounter (Signed)
CY, please advise. Thanks!  

## 2010-10-03 ENCOUNTER — Ambulatory Visit (INDEPENDENT_AMBULATORY_CARE_PROVIDER_SITE_OTHER): Payer: Medicare Other | Admitting: *Deleted

## 2010-10-03 ENCOUNTER — Telehealth: Payer: Self-pay | Admitting: Internal Medicine

## 2010-10-03 DIAGNOSIS — E538 Deficiency of other specified B group vitamins: Secondary | ICD-10-CM

## 2010-10-03 MED ORDER — BECLOMETHASONE DIPROPIONATE 40 MCG/ACT IN AERS: 2.0000 | INHALATION_SPRAY | Freq: Two times a day (BID) | RESPIRATORY_TRACT | Status: DC

## 2010-10-03 MED ORDER — CYANOCOBALAMIN 1000 MCG/ML IJ SOLN
1000.0000 ug | Freq: Once | INTRAMUSCULAR | Status: AC
  Administered 2010-10-03: 1000 ug via INTRAMUSCULAR

## 2010-10-03 NOTE — Telephone Encounter (Signed)
Received copies from [facility] and Throat, on 10/03/2010. Forwarded 2pages to Dr.Norins ,for review.

## 2010-10-03 NOTE — Progress Notes (Signed)
  Subjective:    Patient ID: [name], male    DOB: 26-Sep-1926, 77 y.o.   MRN: [id number]  HPI  Office supplied  Review of Systems     Objective:   Physical Exam        Assessment & Plan:

## 2010-10-03 NOTE — Assessment & Plan Note (Addendum)
I'm not comfortable attributing his cough as he describes it, to allergy, although I can't otherwise explain his elevated total IgE of 217.4 on August 31. It is always possible he is getting exposed to something he doesn't recognize and doesn't report. However he and his wife do not identify any sort of exposure or location that is particularly a problem. We talked about available anti-inflammatory medications. Their insurance will not cover Singulair and has recently turned his wife down. A trial of Daliresp could be considered. A trial of allergy vaccine would be a long shot option if he cares enough.

## 2010-10-03 NOTE — Telephone Encounter (Signed)
Do we know what he wanted done differently? I tried to answer his questions, but I think he didn't like that we didn't find a clear allergic explanation for his cough.

## 2010-10-03 NOTE — Assessment & Plan Note (Signed)
We talked a little about his CT scan demonstrating changes in the right lower lobe that could reflect aspiration or atypical AFB. Decision about bronchoscopy would be left to Dr. Gwenette Greet, since the patient is not spontaneously producing sputum.

## 2010-10-03 NOTE — Telephone Encounter (Signed)
I addressed the issue for which I was consulted. He is referred back to Dr Gwenette Greet for next steps.

## 2010-10-03 NOTE — Telephone Encounter (Signed)
I tried obtaining PA for generic singulair. Was advised that dx of chronic cough is not enough to have med approved, they need to know what is causing the cough first. He kept asking if pt had asthma or allergic rhinitis, I could not say yes b/c these dxs are not in his chart. Dr Annamaria Boots, pt is very upset about this and states "tired of being a guinney pig" and wants something done. Please advise, thanks!

## 2010-10-03 NOTE — Telephone Encounter (Signed)
Per CY-okay to change to Qvar 40 #1 2 puffs bid and rinse mouth after use with 6 refills. I called and spoke with pharmacy-co pay of $20ish; I called to inform patient of this and he accepted the Rx but states he is not happy with CY's proceedings of his care. I explained to patient that I would let CY know this and I am sorry for him feeling this way. Rx sent and pt aware.

## 2010-10-03 NOTE — Telephone Encounter (Addendum)
Spoke with pt. He is upset that singulair needs PA and wants [place] to call ins company. I have called [employer] PA dept at the number pt provided.

## 2010-10-04 NOTE — Telephone Encounter (Signed)
See other phone note dated 10/03/10

## 2010-10-04 NOTE — Telephone Encounter (Signed)
Megan, please arrange for ov with pt NEXT week to discuss further workup.

## 2010-10-04 NOTE — Telephone Encounter (Signed)
Spoke with pt and sched appt with [facility] for next wk- 10/11/10

## 2010-10-06 LAB — COMPREHENSIVE METABOLIC PANEL
ALT: 19
Alkaline Phosphatase: 67
BUN: 27 — ABNORMAL HIGH
CO2: 21
Calcium: 8.4
GFR calc non Af Amer: 33 — ABNORMAL LOW
Glucose, Bld: 95
Sodium: 138
Total Protein: 6.2

## 2010-10-06 LAB — CK TOTAL AND CKMB (NOT AT ARMC)
Relative Index: INVALID
Total CK: 42

## 2010-10-06 LAB — CBC
HCT: 39.1
Hemoglobin: 12.7 — ABNORMAL LOW
MCHC: 32.4
MCV: 80.3
RDW: 15.7 — ABNORMAL HIGH
WBC: 4.5

## 2010-10-06 LAB — DIFFERENTIAL
Basophils Relative: 0
Eosinophils Absolute: 0.1
Lymphs Abs: 0.9
Neutro Abs: 3
Neutrophils Relative %: 65

## 2010-10-06 LAB — URINALYSIS, ROUTINE W REFLEX MICROSCOPIC
Ketones, ur: NEGATIVE
Nitrite: POSITIVE — AB
Protein, ur: NEGATIVE

## 2010-10-06 LAB — TROPONIN I: Troponin I: 0.01

## 2010-10-06 LAB — URINE MICROSCOPIC-ADD ON

## 2010-10-09 LAB — CBC
MCHC: 32.6
MCV: 80.5
Platelets: 195
RDW: 15.8 — ABNORMAL HIGH

## 2010-10-09 LAB — BASIC METABOLIC PANEL
BUN: 27 — ABNORMAL HIGH
CO2: 22
Chloride: 110
Creatinine, Ser: 1.79 — ABNORMAL HIGH
Glucose, Bld: 84

## 2010-10-11 ENCOUNTER — Encounter: Payer: Self-pay | Admitting: Pulmonary Disease

## 2010-10-11 ENCOUNTER — Ambulatory Visit (INDEPENDENT_AMBULATORY_CARE_PROVIDER_SITE_OTHER): Payer: Medicare Other | Admitting: Pulmonary Disease

## 2010-10-11 ENCOUNTER — Telehealth: Payer: Self-pay | Admitting: Pulmonary Disease

## 2010-10-11 VITALS — BP 110/58 | HR 65 | Temp 98.3°F | Ht 72.0 in | Wt 177.6 lb

## 2010-10-11 DIAGNOSIS — R05 Cough: Secondary | ICD-10-CM

## 2010-10-11 MED ORDER — IPRATROPIUM BROMIDE 0.03 % NA SOLN: 2.0000 | Freq: Two times a day (BID) | NASAL | Status: DC

## 2010-10-11 NOTE — Progress Notes (Signed)
  Subjective:    Patient ID: [name], male    DOB: [birth year], 77 y.o.   MRN: [id number]  HPI The patient comes in today for followup of his chronic cough.  He has had an extensive workup by ENT, allergy, and pulmonary, with very little being found at this time.  He continues to have significant postnasal drip which is probably due to non-allergic rhinitis.  He does have a CT chest with a few very tiny nodules, as well as one tiny area of bronchiectasis in the right lower lobe that looks unimpressive.  He is here today to discuss further options for evaluation and treatment.   Review of Systems  Constitutional: Negative for fever and unexpected weight change.  HENT: Positive for congestion and postnasal drip. Negative for ear pain, nosebleeds, sore throat, rhinorrhea, sneezing, trouble swallowing, dental problem and sinus pressure.   Eyes: Negative for redness and itching.  Respiratory: Positive for cough, chest tightness, shortness of breath and wheezing.   Cardiovascular: Negative for palpitations and leg swelling.  Gastrointestinal: Negative for nausea and vomiting.  Genitourinary: Negative for dysuria.  Musculoskeletal: Negative for joint swelling.  Skin: Negative for rash.  Neurological: Negative for headaches.  Hematological: Does not bruise/bleed easily.  Psychiatric/Behavioral: Negative for dysphoric mood. The patient is not nervous/anxious.        Objective:   Physical Exam Well-developed male in no acute distress Naris without purulence or discharge. Chest totally clear to auscultation Cardiac exam with regular rate and rhythm Lower extremities without edema, no cyanosis noted Alert and oriented, moves all 4 extremities.       Assessment & Plan:

## 2010-10-11 NOTE — Telephone Encounter (Signed)
Megan, please let pt know that he needs f/u ct chest in FEB of 2013 given his history of prostate cancer.  Have him call us to remind, and we will put reminder in computer.

## 2010-10-11 NOTE — Telephone Encounter (Signed)
Called and spoke with pt. Pt aware of KC"s recs.  Nothing further needed.

## 2010-10-11 NOTE — Assessment & Plan Note (Signed)
The patient has had an extensive workup for his chronic cough.  The only thing we have not resolved is his ongoing postnasal drip, but is more than likely related to nonallergic rhinitis.  His CT chest is not totally normal, but it is not impressive either.  I continue to believe his cough is primarily upper airway rather than lower.  I have offered him further treatment with Atrovent nasal spray for non-allergic rhinitis, and if this does not help would consider bronchoscopy for an airway exam and also to do BAL for possible infection or eosinophilia.  I also discussed with him referring to a tertiary care center for a second opinion.  He would like to stay here if possible, and we'll try the Atrovent nasal spray first.

## 2010-10-11 NOTE — Patient Instructions (Signed)
Stop qvar and nasonex Trial of ipratroprium nasal spray 2 each nostril am and pm for your nonallergic rhinitis.  Keep head forward for 10-20 seconds after using. Please call me in 2 weeks with an update of your cough.  If not improving, would consider bronchoscopy as we discussed.

## 2010-10-13 ENCOUNTER — Ambulatory Visit (INDEPENDENT_AMBULATORY_CARE_PROVIDER_SITE_OTHER): Payer: Medicare Other | Admitting: Endocrinology

## 2010-10-13 ENCOUNTER — Encounter: Payer: Self-pay | Admitting: Endocrinology

## 2010-10-13 DIAGNOSIS — I10 Essential (primary) hypertension: Secondary | ICD-10-CM

## 2010-10-13 NOTE — Progress Notes (Signed)
Subjective:    Patient ID: [name], male    DOB: 19-Feb-1926, 77 y.o.   MRN: [id number]  HPI Pt states 2 days of moderate myalgias throughout the body, and assoc fever.  he also has an increase in his chronic rhinorrhea.  No earache, but he has slight dizziness.   Past Medical History  Diagnosis Date  . Prostate cancer   . Hypertension   . CAD (coronary artery disease)     S/P stenting to mid RCA, prox PDA 06/1999;  06/2007 Myoview: negative except for apical thinning, EF 68%  . Renal insufficiency   . Exertional angina     Resolved  . Dyslipidemia   . Neck injury     C3-C4 and C4-C5 foraminal narrowing, severe  . Renal artery stenosis     50% by cath    Past Surgical History  Procedure Date  . S/p ptca   . Cardia catherization 07-07-99  . Peripheral vascular catherization 11-24-03  . Renal circulation 10-01-03  . Stress cardiolite 05-04-05    spring 09-negative except for apical thinning, EF 68%  . Edg 07-17-1994  . Flexible sigmoidoscopy 11-03-1997  . Prostatectomy   . Lumbar spinal disk and neck fusion surgery   . Stents     X 2    History   Social History  . Marital Status: Married    Spouse Name: N/A    Number of Children: 2  . Years of Education: N/A   Occupational History  . 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     Social History Main Topics  . Smoking status: Never Smoker   . Smokeless tobacco: Never Used  . Alcohol Use: Yes     Rarely  . Drug Use: Not on file  . Sexually Active: Not on file   Other Topics Concern  . Not on file   Social History Narrative   HSG, 1 year college.  married '52 - 3 years, divorced; married '56 - 3 years divorced; married '24-12 yrs - divorced; married '75 -. 1 son '57; 1 daughter - '53; 1 grandchild.  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  Very happily married.  End of life care: yes  CPR, no long term mechanical ventilation, no heroic measures.    Current Outpatient Prescriptions on File Prior to Visit  Medication Sig Dispense Refill  . aspirin 325 MG buffered tablet Take 325 mg by mouth daily.        . clotrimazole-betamethasone (LOTRISONE) cream Apply 1 application topically 2 (two) times daily as needed.  60 g  3  . CYANOCOBALAMIN IJ Inject as directed.        . ergocalciferol (VITAMIN D2) 50000 UNITS capsule Take 50,000 Units by mouth every 30 (thirty) days.        . folic acid (FOLVITE) 1 MG tablet Take 1 tablet (1 mg total) by mouth daily.  90 tablet  3  . gemfibrozil (LOPID) 600 MG tablet Take 600 mg by mouth 2 (two) times daily before a meal.        . ipratropium (ATROVENT) 0.03 % nasal spray Place 2 sprays into the nose every 12 (twelve) hours.  30 mL  4  . metoprolol (TOPROL-XL) 50 MG 24 hr tablet Take 1 tablet (50 mg total) by mouth daily.  90 tablet  3  . nitroGLYCERIN (NITROSTAT) 0.4 MG SL tablet Place 1 tablet (0.4 mg total) under the tongue every 5 (five) minutes as needed.  90 tablet  3  . potassium chloride (KLOR-CON) 8 MEQ CR tablet Take 8 mEq by mouth daily.        . simvastatin (ZOCOR) 20 MG tablet Take 20 mg by mouth at bedtime.        . traZODone (DESYREL) 50 MG tablet Take 50 mg by mouth daily as needed.       . beclomethasone (QVAR) 40 MCG/ACT inhaler Inhale 2 puffs into the lungs 2 (two) times daily. Rinse mouth after use  1 Inhaler  6    Allergies  Allergen Reactions  . Advicor   . Amoxicillin   . Aspirin     REACTION: upset stomach  . Atarax (Hydroxyzine Hcl)   . Ciprofloxacin   . Codeine     REACTION: Stomach upset  . Hydrocodone   . Niacin   . Nitrofurantoin   . Trimox (Amoxicillin Trihydrate)     Family History  Problem Relation Age of Onset  . Coronary artery disease Father   . Cancer Brother     Bladder  . Diabetes Neg Hx   . Prostate cancer Brother   . Nephritis Brother   . Other Brother     cerebral hemorrhage  .  Other Mother     cerebral hemorrhage  . Heart disease Brother   . Arthritis Mother   . Breast cancer Other     neice x 2    BP 92/50  Pulse 72  Temp(Src) 99.7 F (37.6 C) (Oral)  Ht 6' (1.829 m)  Wt 175 lb 6.4 oz (79.561 kg)  BMI 23.79 kg/m2  SpO2 96%    Review of Systems Denies sob and n/v.  No change in cough (x 4 mos).    Objective:   Physical Exam VITAL SIGNS:  See vs page GENERAL: no distress head: no deformity eyes: no periorbital swelling, no proptosis external nose and ears are normal mouth: no lesion seen Both eac's and tm's are normal LUNGS:  Clear to auscultation    Assessment & Plan:  Acute viral syndrome, new Htn, overcontrolled, prob due to acute illness Multiple drug allergies.  This limits rx options, should he need abx

## 2010-10-13 NOTE — Patient Instructions (Addendum)
I hope you feel better soon.  If you don't feel better by next week, please call dr Linda Hedges. For now, stop furosemide and amlodipine. Dr degent will recheck your blood pressure when you go there on Monday. Take tylenol as needed for the fever and body aches.

## 2010-10-16 ENCOUNTER — Ambulatory Visit (INDEPENDENT_AMBULATORY_CARE_PROVIDER_SITE_OTHER): Payer: Medicare Other | Admitting: Cardiology

## 2010-10-16 VITALS — BP 122/68 | HR 62

## 2010-10-16 DIAGNOSIS — I10 Essential (primary) hypertension: Secondary | ICD-10-CM

## 2010-10-16 DIAGNOSIS — N259 Disorder resulting from impaired renal tubular function, unspecified: Secondary | ICD-10-CM

## 2010-10-16 DIAGNOSIS — E7800 Pure hypercholesterolemia, unspecified: Secondary | ICD-10-CM

## 2010-10-16 DIAGNOSIS — R05 Cough: Secondary | ICD-10-CM

## 2010-10-16 DIAGNOSIS — I2510 Atherosclerotic heart disease of native coronary artery without angina pectoris: Secondary | ICD-10-CM

## 2010-10-16 MED ORDER — AMLODIPINE BESYLATE 10 MG PO TABS: 10.0000 mg | ORAL_TABLET | Freq: Every day | ORAL | Status: DC

## 2010-10-16 MED ORDER — FLUTICASONE PROPIONATE 50 MCG/ACT NA SUSP: 1.0000 | Freq: Two times a day (BID) | NASAL | Status: DC

## 2010-10-16 NOTE — Assessment & Plan Note (Signed)
Stable from CAD perspective. No recurrent chest pain. Continue risk factor modifications

## 2010-10-16 NOTE — Assessment & Plan Note (Signed)
Clinically the patient has upper airway cough syndrome versus postnasal drip. I told him that I do not want to over take his medical care but that he should give it a try with diphenhydramine/phenylephrine and nasal spray which he inhaled nasal steroids. He can also use dextromethorphan when necessary for cough. The patient in followup with his allergist, pulmonologist, ENT and primary care physician for further recommendations.

## 2010-10-16 NOTE — Assessment & Plan Note (Signed)
ECU metoprolol and resume amlodipine 10 mg by mouth daily

## 2010-10-16 NOTE — Assessment & Plan Note (Signed)
Creatinine stable at 2.1. Patient has followup with nephrology

## 2010-10-16 NOTE — Progress Notes (Signed)
History of present illness: The patient is a 77 year old male with a history of coronary artery disease status post single-vessel CAD requiring stent placement many years ago. This procedure was performed in 2001. The patient had a stress test last year with no evidence of ischemia. He reports no recurrent chest pain or shortness of breath. The patient remains very active. He has underlying renal insufficiency but his creatinine has remained stable for several years now at around 2.0. During his last office visit we also performed a limited bedside echocardiogram and was found to have normal LV function. His most current problem is related to a chronic cough [greater than 3 weeks in duration] with minimal response to various therapeutic measures. The patient historically however describes a postnasal drip which itself sometimes violent coughing spells. There has been some improvement over the last couple weeks. Multiple measures have been tried and also because of fever, I guess there was some concern about drug fevers and amlodipine was discontinued. The patient also tried ipratropium bromide nasal spray which causes significant headaches and facial pain. There are some plans to do a bronchoscopy and possibly place him on montelukast /Singulair.   Allergies, family history and social history: As documented in chart and reviewed  Medications: Documented and reviewed in chart.  Past medical history: Reviewed and see problem list below   Review of systems: Stable from a cardiac perspective. No chest pain shortness of breath orthopnea PND. No palpitations or syncope. No melena or hematochezia. Insomnia resolved on trazodone   Physical examination : Vital signs documented below General: Well-nourished white male in no distress. No coughing spells in the office HEENT: EOMI, PERRLA, no thyromegaly nonnodular thyroid. Normal carotid upstroke Lungs: Clear breath sounds bilaterally. No wheezing Heart:  Regular rate and rhythm with normal S1-S2 no murmur rubs or gallops Abdomen: Soft nontender no rebound or guarding good bowel sounds Extremity exam: No cyanosis clubbing or edema Neurologic: Alert and oriented and grossly nonfocal Psychiatric: Normal affect Vascular exam: Normal peripheral pulses

## 2010-10-16 NOTE — Assessment & Plan Note (Signed)
Taking gemfibrozil. Lipid panel followed by his primary care physician.

## 2010-10-16 NOTE — Patient Instructions (Addendum)
   Stop Metoprolol  Resume Amlodipine 10mg  daily  Flonase Nasal Spray  OTC - Benadryl D allergy Plus Sinus - may take one tab every 4-6 hours as needed for cough / post-nasal drip  OTC - Dextromethorphan 10mg  - may take as needed for cough every 6 hours Your physician wants you to follow up in: 6 months.  You will receive a reminder letter in the mail one-two months in advance.  If you don't receive a letter, please call our office to schedule the follow up appointment

## 2010-10-17 ENCOUNTER — Encounter: Payer: Self-pay | Admitting: Internal Medicine

## 2010-11-02 ENCOUNTER — Telehealth: Payer: Self-pay | Admitting: Pulmonary Disease

## 2010-11-02 NOTE — Telephone Encounter (Signed)
Looked into this message.  Pt AND his wife are both pt's here at [facility] a message was left for his wife, not pt.  Therefore, will sign off on this message.

## 2010-11-02 NOTE — Telephone Encounter (Signed)
Note: i checked with katie and she didn't call him (pt sees kc and cy but not dr Joya Gaskins as far as i could see). Mariann Laster

## 2010-11-07 ENCOUNTER — Ambulatory Visit (INDEPENDENT_AMBULATORY_CARE_PROVIDER_SITE_OTHER): Payer: Medicare Other | Admitting: *Deleted

## 2010-11-07 DIAGNOSIS — E538 Deficiency of other specified B group vitamins: Secondary | ICD-10-CM

## 2010-11-07 MED ORDER — CYANOCOBALAMIN 1000 MCG/ML IJ SOLN
1000.0000 ug | Freq: Once | INTRAMUSCULAR | Status: AC
  Administered 2010-11-07: 1000 ug via INTRAMUSCULAR

## 2010-11-15 ENCOUNTER — Ambulatory Visit: Payer: Medicare Other | Admitting: Internal Medicine

## 2010-12-08 ENCOUNTER — Ambulatory Visit (INDEPENDENT_AMBULATORY_CARE_PROVIDER_SITE_OTHER): Payer: Medicare Other

## 2010-12-08 DIAGNOSIS — E538 Deficiency of other specified B group vitamins: Secondary | ICD-10-CM

## 2010-12-08 MED ORDER — CYANOCOBALAMIN 1000 MCG/ML IJ SOLN
1000.0000 ug | Freq: Once | INTRAMUSCULAR | Status: AC
  Administered 2010-12-08: 1000 ug via INTRAMUSCULAR

## 2010-12-12 ENCOUNTER — Other Ambulatory Visit: Payer: Self-pay | Admitting: Internal Medicine

## 2010-12-12 ENCOUNTER — Other Ambulatory Visit (INDEPENDENT_AMBULATORY_CARE_PROVIDER_SITE_OTHER): Payer: Medicare Other

## 2010-12-12 DIAGNOSIS — E538 Deficiency of other specified B group vitamins: Secondary | ICD-10-CM

## 2010-12-12 DIAGNOSIS — E7800 Pure hypercholesterolemia, unspecified: Secondary | ICD-10-CM

## 2010-12-12 DIAGNOSIS — I10 Essential (primary) hypertension: Secondary | ICD-10-CM

## 2010-12-12 LAB — LIPID PANEL
Cholesterol: 128 mg/dL (ref 0–200)
LDL Cholesterol: 74 mg/dL (ref 0–99)
VLDL: 17.2 mg/dL (ref 0.0–40.0)

## 2010-12-12 LAB — COMPREHENSIVE METABOLIC PANEL
AST: 20 U/L (ref 0–37)
Alkaline Phosphatase: 80 U/L (ref 39–117)
Glucose, Bld: 88 mg/dL (ref 70–99)
Sodium: 141 mEq/L (ref 135–145)
Total Bilirubin: 0.8 mg/dL (ref 0.3–1.2)
Total Protein: 7.2 g/dL (ref 6.0–8.3)

## 2010-12-18 ENCOUNTER — Encounter: Payer: Self-pay | Admitting: Internal Medicine

## 2011-01-10 ENCOUNTER — Ambulatory Visit (INDEPENDENT_AMBULATORY_CARE_PROVIDER_SITE_OTHER): Payer: Medicare Other | Admitting: *Deleted

## 2011-01-10 DIAGNOSIS — E538 Deficiency of other specified B group vitamins: Secondary | ICD-10-CM

## 2011-01-10 MED ORDER — CYANOCOBALAMIN 1000 MCG/ML IJ SOLN
1000.0000 ug | Freq: Once | INTRAMUSCULAR | Status: AC
  Administered 2011-01-10: 1000 ug via INTRAMUSCULAR

## 2011-01-12 ENCOUNTER — Other Ambulatory Visit: Payer: Self-pay | Admitting: Internal Medicine

## 2011-01-12 MED ORDER — POTASSIUM CHLORIDE ER 10 MEQ PO TBCR: 10.0000 meq | EXTENDED_RELEASE_TABLET | Freq: Every day | ORAL | Status: DC

## 2011-01-12 MED ORDER — AMLODIPINE BESYLATE 10 MG PO TABS: 10.0000 mg | ORAL_TABLET | Freq: Every day | ORAL | Status: DC

## 2011-01-12 MED ORDER — SIMVASTATIN 20 MG PO TABS: 20.0000 mg | ORAL_TABLET | Freq: Every day | ORAL | Status: DC

## 2011-01-18 ENCOUNTER — Ambulatory Visit (INDEPENDENT_AMBULATORY_CARE_PROVIDER_SITE_OTHER): Payer: Medicare Other | Admitting: Internal Medicine

## 2011-01-18 DIAGNOSIS — I872 Venous insufficiency (chronic) (peripheral): Secondary | ICD-10-CM

## 2011-01-18 DIAGNOSIS — R05 Cough: Secondary | ICD-10-CM

## 2011-01-18 MED ORDER — FLUTICASONE PROPIONATE 50 MCG/ACT NA SUSP: 1.0000 | Freq: Two times a day (BID) | NASAL | Status: DC

## 2011-01-18 NOTE — Progress Notes (Signed)
  Subjective:    Patient ID: [name], male    DOB: 1926-05-23, 78 y.o.   MRN: [id number]  HPI Mr. Adcox continues to have a cough. Dr. Dannielle Burn prescribed flonase  and zyrtec. Zyrtec makes him too dry. He is still coughing, but less. He has been evaluated for reflux which was a negative workup.   Venous insufficiency- mild. No decompensation re: heart failure or acute increase in renal failure.  I have reviewed the patient's medical history in detail and updated the computerized patient record.    Review of Systems System review is negative for any constitutional, cardiac, pulmonary, GI or neuro symptoms or complaints other than as described in the HPI.     Objective:   Physical Exam Filed Vitals:   01/18/11 1045  BP: 118/68  Pulse: 70  Temp: 97.9 F (36.6 C)   Gen'l - WNWD white man in no distress HEENT- C&S clear, no rhinorrhea Pulm - normal respirations, no rales or wheezes Cor - RRR  BMET    Component Value Date/Time   NA 141 12/12/2010 0906   K 4.1 12/12/2010 0906   CL 110 12/12/2010 0906   CO2 23 12/12/2010 0906   GLUCOSE 88 12/12/2010 0906   BUN 24* 12/12/2010 0906   CREATININE 1.9* 12/12/2010 0906   CREATININE 2.06* 06/06/2010 1014   CALCIUM 9.1 12/12/2010 0906   GFRNONAA 41.00* 12/13/2009 1132   GFRAA 44 11/05/2007 0857        pBNP                     55                                                                    05/--/12       Assessment & Plan:

## 2011-01-18 NOTE — Patient Instructions (Signed)
Cough - most likely vasomotor rhinitis. Plan- continue flonase; add mucinex 1200 mg twice a day; try other non-sedating antihistamine, i.e. Allegra or Claritin.  Venous insufficiency  Most common cause of peripheral edema - in your case it sounds mild.  Skin - no suspicious lesions noted today. A dermatologist may be helpful.   Venous Stasis and Chronic Venous Insufficiency As people age, the veins located in their legs may weaken and stretch. When veins weaken and lose the ability to pump blood effectively, the condition is called chronic venous insufficiency (CVI) or venous stasis. Almost all veins return blood back to the heart. This happens by:  The force of the heart pumping fresh blood pushes blood back to the heart.     Blood flowing to the heart from the force of gravity.  In the deep veins of the legs, blood has to fight gravity and flow upstream back to the heart. Here, the leg muscles contract to pump blood back toward the heart. Vein walls are elastic, and many veins have small valves that only allow blood to flow in one direction. When leg muscles contract, they push inward against the elastic vein walls. This squeezes blood upward, opens the valves, and moves blood toward the heart. When leg muscles relax, the vein wall also relaxes and the valves inside the vein close to prevent blood from flowing backward. This method of pumping blood out of the legs is called the venous pump. CAUSES   The venous pump works best while walking and leg muscles are contracting. But when a person sits or stands, blood pressure in leg veins can build. Deep veins are usually able to withstand short periods of inactivity, but long periods of inactivity (and increased pressure) can stretch, weaken, and damage vein walls. High blood pressure can also stretch and damage vein walls. The veins may no longer be able to pump blood back to the heart. Venous hypertension (high blood pressure inside veins) that lasts  over time is a primary cause of CVI. CVI can also be caused by:    Deep vein thrombosis, a condition where a thrombus (blood clot) blocks blood flow in a vein.     Phlebitis, an inflammation of a superficial vein that causes a blood clot to form.  Other risk factors for CVI may include:    Heredity.     Obesity.    Pregnancy.    Sedentary lifestyle.     Smoking.    Jobs requiring long periods of standing or sitting in one place.     Age and gender:     Women in their 55's and 89's and men in their 9's are more prone to developing CVI.  SYMPTOMS   Symptoms of CVI may include:    Varicose veins.     Ulceration or skin breakdown.     Lipodermatosclerosis, a condition that affects the skin just above the ankle, usually on the inside surface.  Over time the skin becomes brown, smooth, tight and often painful. Those with this condition have a high risk of developing skin ulcers.     Reddened or discolored skin on the leg.     Swelling.  DIAGNOSIS  Your caregiver can diagnose CVI after performing a careful medical history and physical examination. To confirm the diagnosis, the following tests may also be ordered:    Duplex ultrasound.     Plethysmography (tests blood flow).     Venograms (x-ray using a special dye).  TREATMENT The goals  of treatment for CVI are to restore a person to an active life and to minimize pain or disability. Typically, CVI does not pose a serious threat to life or limb, and with proper treatment most people with this condition can continue to lead active lives. In most cases, mild CVI can be treated on an outpatient basis with simple procedures. Treatment methods include:    Elastic compression socks.     Sclerotherapy, a procedure involving an injection of a material that "dissolves" the damaged veins. Other veins in the network of blood vessels take over the function of the damaged veins.     Vein stripping (an older procedure less commonly  used).     Laser Ablation surgery.     Valve repair.  HOME CARE INSTRUCTIONS    Elastic compression socks must be worn every day. They can help with symptoms and lower the chances of the problem getting worse, but they do not cure the problem.     Only take over-the-counter or prescription medicines for pain, discomfort, or fever as directed by your caregiver.     Your caregiver will review your other medications with you.  SEEK MEDICAL CARE IF:    You are confused about how to take your medications.     There is redness, swelling, or increasing pain in the affected area.     There is a red streak or line that extends up or down from the affected area.     There is a breakdown or loss of skin in the affected area, even if the breakdown is small.     You develop an unexplained oral temperature above 102 F (38.9 C).     There is an injury to the affected area.  SEEK IMMEDIATE MEDICAL CARE IF:    There is an injury and open wound to the affected area.     Pain is not adequately relieved with pain medication prescribed or becomes severe.     An oral temperature above 102 F (38.9 C) develops.     The foot/ankle below the affected area becomes suddenly numb or the area feels weak and hard to move.  MAKE SURE YOU:    Understand these instructions.     Will watch your condition.     Will get help right away if you are not doing well or get worse.  Document Released: 04/30/2006 Document Revised: 09/06/2010 Document Reviewed: 07/08/2006 [facility] Patient Information 2012 [place].

## 2011-01-21 NOTE — Assessment & Plan Note (Signed)
Continued cough - negative allergen evaluation - most likely vasomotor rhinitis.   Plan - continue with non-sedating antihistamine, e.g. claritin or allegra           Continue nasal inhalational steroid - flonase           Tessalon perles           Cough syrup of choice, e.g. Robitussin DM

## 2011-01-21 NOTE — Assessment & Plan Note (Signed)
Most likely cause of peripheral edema that is progressive during the day and resolves overnight, in the absence of over heart or renal failure, is venous insufficiency  Full explanation, with cartoon and handout, provided.  Plan - elevation of legs           Knee high support hosiery.

## 2011-02-05 ENCOUNTER — Telehealth: Payer: Self-pay | Admitting: Pulmonary Disease

## 2011-02-05 DIAGNOSIS — R918 Other nonspecific abnormal finding of lung field: Secondary | ICD-10-CM

## 2011-02-05 NOTE — Telephone Encounter (Signed)
Needs noncontrast ct chest.  Thanks.

## 2011-02-05 NOTE — Telephone Encounter (Signed)
Per 08/22/10 note pt needs scan in 6 months- so he is due in Feb to have repeat scan. Please advise if with or without cm and triage will send order, thanks

## 2011-02-06 ENCOUNTER — Other Ambulatory Visit: Payer: Self-pay | Admitting: *Deleted

## 2011-02-06 MED ORDER — GEMFIBROZIL 600 MG PO TABS: 600.0000 mg | ORAL_TABLET | Freq: Two times a day (BID) | ORAL | Status: DC

## 2011-02-06 NOTE — Telephone Encounter (Signed)
Done

## 2011-02-06 NOTE — Telephone Encounter (Signed)
Spoke with [name] and advised order was sent to [facility] for ct chest and they will be contacting them soon to schedule this. She verbalized understanding and states nothing further needed.

## 2011-02-12 ENCOUNTER — Ambulatory Visit (INDEPENDENT_AMBULATORY_CARE_PROVIDER_SITE_OTHER): Payer: Medicare Other | Admitting: *Deleted

## 2011-02-12 DIAGNOSIS — E538 Deficiency of other specified B group vitamins: Secondary | ICD-10-CM

## 2011-02-12 MED ORDER — CYANOCOBALAMIN 1000 MCG/ML IJ SOLN
1000.0000 ug | Freq: Once | INTRAMUSCULAR | Status: AC
  Administered 2011-02-12: 1000 ug via INTRAMUSCULAR

## 2011-02-14 ENCOUNTER — Ambulatory Visit (INDEPENDENT_AMBULATORY_CARE_PROVIDER_SITE_OTHER)
Admission: RE | Admit: 2011-02-14 | Discharge: 2011-02-14 | Disposition: A | Discharge status: Home / Self Care | Payer: Medicare Other | Source: Ambulatory Visit | Attending: Pulmonary Disease | Admitting: Pulmonary Disease

## 2011-02-14 DIAGNOSIS — R918 Other nonspecific abnormal finding of lung field: Secondary | ICD-10-CM

## 2011-02-16 ENCOUNTER — Telehealth: Payer: Self-pay | Admitting: Pulmonary Disease

## 2011-02-16 NOTE — Telephone Encounter (Signed)
Returning call can be reached at [phone].Elnita Maxwell

## 2011-02-16 NOTE — Telephone Encounter (Signed)
I spoke with pt and he is requesting his ct report be mailed to his home address. I confirmed address and I have placed this upfront to go out in the mail.

## 2011-02-16 NOTE — Telephone Encounter (Signed)
lmomtcb x1 

## 2011-03-12 ENCOUNTER — Ambulatory Visit (INDEPENDENT_AMBULATORY_CARE_PROVIDER_SITE_OTHER): Payer: Medicare Other | Admitting: *Deleted

## 2011-03-12 DIAGNOSIS — E538 Deficiency of other specified B group vitamins: Secondary | ICD-10-CM

## 2011-03-12 MED ORDER — CYANOCOBALAMIN 1000 MCG/ML IJ SOLN
1000.0000 ug | Freq: Once | INTRAMUSCULAR | Status: AC
  Administered 2011-03-12: 1000 ug via INTRAMUSCULAR

## 2011-03-19 ENCOUNTER — Telehealth: Payer: Self-pay | Admitting: *Deleted

## 2011-03-19 NOTE — Telephone Encounter (Signed)
Patient says a Rx for Klor con was sent to [facility] for him and it is a different strength than what he was currently taking.  He was taking Klor-con 8 mEq and was sent Klor-Con 8  600 mg.  He says he was not aware of being told there is a  change in his dosage.  Please advise.

## 2011-03-19 NOTE — Telephone Encounter (Signed)
We sent in klor con or K-dur 8 meq - question is this: is 600 mg the same as 8 meq of potassium. Call any pharmacist to check.

## 2011-03-21 NOTE — Telephone Encounter (Signed)
Pt called back regarding Klor Con Rx. Pt states that he received rx for 75meq and has previously only taken 29meq. Pt wants to know if his dosage has been changed and if he should take rx for 89meq or if rx needs to be sent for 36meq

## 2011-03-21 NOTE — Telephone Encounter (Signed)
Pt called back regarding Klor Con Rx. Pt states that he received rx for 60meq and has previously only taken 63meq. Pt wants to know if his dosage has been changed and if he should take rx for 89meq or if rx needs to be sent for 48meq. Patient was taking 8 meq in [facility], but between an OV [w/Dr Ellison] on 10.05.12 and a 01.10.13 OV with you, a change occurred in the Klor-Con dosage from 8 meq to 10 meq; I cannot decipher why the change happened. Please advise.

## 2011-03-21 NOTE — Telephone Encounter (Signed)
May have been a slip between providers. The difference between 8 znd 10 meq is inconsequential. He can have it either way.

## 2011-03-22 NOTE — Telephone Encounter (Signed)
Patient informed. 

## 2011-04-12 ENCOUNTER — Ambulatory Visit (INDEPENDENT_AMBULATORY_CARE_PROVIDER_SITE_OTHER): Payer: Medicare Other

## 2011-04-12 DIAGNOSIS — E538 Deficiency of other specified B group vitamins: Secondary | ICD-10-CM

## 2011-04-12 MED ORDER — CYANOCOBALAMIN 1000 MCG/ML IJ SOLN
1000.0000 ug | Freq: Once | INTRAMUSCULAR | Status: AC
  Administered 2011-04-12: 1000 ug via INTRAMUSCULAR

## 2011-05-03 ENCOUNTER — Encounter: Payer: Self-pay | Admitting: Cardiology

## 2011-05-03 ENCOUNTER — Ambulatory Visit (INDEPENDENT_AMBULATORY_CARE_PROVIDER_SITE_OTHER): Payer: Medicare Other | Admitting: Cardiology

## 2011-05-03 VITALS — BP 129/66 | HR 69 | Ht 72.0 in | Wt 184.0 lb

## 2011-05-03 DIAGNOSIS — I2510 Atherosclerotic heart disease of native coronary artery without angina pectoris: Secondary | ICD-10-CM

## 2011-05-03 DIAGNOSIS — I872 Venous insufficiency (chronic) (peripheral): Secondary | ICD-10-CM

## 2011-05-03 DIAGNOSIS — N289 Disorder of kidney and ureter, unspecified: Secondary | ICD-10-CM

## 2011-05-03 DIAGNOSIS — I10 Essential (primary) hypertension: Secondary | ICD-10-CM

## 2011-05-03 DIAGNOSIS — R05 Cough: Secondary | ICD-10-CM

## 2011-05-03 DIAGNOSIS — R911 Solitary pulmonary nodule: Secondary | ICD-10-CM

## 2011-05-03 NOTE — Patient Instructions (Signed)
Continue all current medications. Your physician wants you to follow up in: 6 months.  You will receive a reminder letter in the mail one-two months in advance.  If you don't receive a letter, please call our office to schedule the follow up appointment   

## 2011-05-04 ENCOUNTER — Encounter: Payer: Self-pay | Admitting: Cardiology

## 2011-05-04 DIAGNOSIS — R911 Solitary pulmonary nodule: Secondary | ICD-10-CM | POA: Insufficient documentation

## 2011-05-04 DIAGNOSIS — I2510 Atherosclerotic heart disease of native coronary artery without angina pectoris: Secondary | ICD-10-CM | POA: Insufficient documentation

## 2011-05-04 DIAGNOSIS — N184 Chronic kidney disease, stage 4 (severe): Secondary | ICD-10-CM | POA: Insufficient documentation

## 2011-05-04 NOTE — Assessment & Plan Note (Signed)
Resolved

## 2011-05-04 NOTE — Assessment & Plan Note (Signed)
Patient has some mild edema associated with venous insufficiency of the lower extremities.  No evidence of heart failure

## 2011-05-04 NOTE — Progress Notes (Signed)
Mike Real, MD, [facility] Board Certified in Adult Cardiovascular Medicine,Internal Medicine and Critical Care Medicine    CC: followup patient with a history of single-vessel coronary artery disease  HPI:  The patient reports no recurrent chest pain.  He is doing well from a cardiac perspective.  He continues to work out at [employer] he reports no shortness of breath.  He underwent stent placement in 2001 but had a perfusion study done in 2011 which showed no ischemia.  He had a chronic cough for a while but this has now resolved.  He denies any orthopnea PND , palpitations and presyncope or syncope.  He is stable from a cardiac perspective He recently underwent a workup for a 4 mm pulmonary nodule with a followup CT scan which was felt to be stable and noncancerous.  Apparently no further followup is required as per pulmonology.  The patient also continues to follow with his nephrologist regarding his renal insufficiency which continues to be stable.  PMH: reviewed and listed in Problem List in Electronic Records (and see below) Past Medical History  Diagnosis Date  . Prostate cancer   . Hypertension     well-controlled.  [name] CAD (coronary artery disease)     S/P stenting to mid RCA, prox PDA 06/1999;  06/2007 Myoview: negative except for apical thinning, EF 68%, last Myoview August 10, 2008 with no significant ischemia  . Renal insufficiency     stable with a creatinine around 1.9-2.0 followed by nephrology.  . Exertional angina     Resolved  . Dyslipidemia   . Neck injury     C3-C4 and C4-C5 foraminal narrowing, severe  . Renal artery stenosis     50% by cath  . Pulmonary nodule     felt to be noncancerous.  Status post followup CT scan 4 mm and stable.  . Chronic UTI    Past Surgical History  Procedure Date  . S/p ptca   . Cardia catherization 07-07-99  . Peripheral vascular catherization 11-24-03  . Renal circulation 10-01-03  . Stress cardiolite 05-04-05    spring  09-negative except for apical thinning, EF 68%  . Edg 07-17-1994  . Flexible sigmoidoscopy 11-03-1997  . Prostatectomy   . Lumbar spinal disk and neck fusion surgery   . Stents     X 2    Allergies/SH/FHX : available in Electronic Records for review  Allergies  Allergen Reactions  . Advicor   . Amoxicillin   . Aspirin     REACTION: upset stomach  . Atarax (Hydroxyzine Hcl)   . Ciprofloxacin   . Codeine     REACTION: Stomach upset  . Hydrocodone   . Niacin   . Nitrofurantoin   . Trimox (Amoxicillin Trihydrate)    History   Social History  . Marital Status: Married    Spouse Name: N/A    Number of Children: 2  . Years of Education: N/A   Occupational History  . 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     Social History Main Topics  . Smoking status: Never Smoker   . Smokeless tobacco: Never Used  . Alcohol Use: Yes     Rarely  . Drug Use: Not on file  . Sexually Active: Not on file   Other Topics Concern  . Not on file   Social History  Narrative   HSG, 1 year college.  married '52 - 3 years, divorced; married '56 - 3 years divorced; married '63-12 yrs - divorced; married '75 -. 1 son '57; 1 daughter - '53; 1 grandchild.  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  Very happily married.  End of life care: yes CPR, no long term mechanical ventilation, no heroic measures.   Family History  Problem Relation Age of Onset  . Coronary artery disease Father   . Cancer Brother     Bladder  . Diabetes Neg Hx   . Prostate cancer Brother   . Nephritis Brother   . Other Brother     cerebral hemorrhage  . Other Mother     cerebral hemorrhage  . Heart disease Brother   . Arthritis Mother   . Breast cancer Other     neice x 2    Medications: Current Outpatient Prescriptions  Medication Sig Dispense Refill  . amLODipine (NORVASC) 10 MG tablet  Take 1 tablet (10 mg total) by mouth daily.  90 tablet  3  . aspirin 325 MG buffered tablet Take 325 mg by mouth daily.        . clotrimazole-betamethasone (LOTRISONE) cream Apply 1 application topically 2 (two) times daily as needed.  60 g  3  . CYANOCOBALAMIN IJ Inject 1 mL as directed every 30 (thirty) days.       . ergocalciferol (VITAMIN D2) 50000 UNITS capsule Take 50,000 Units by mouth every 30 (thirty) days.        . folic acid (FOLVITE) 1 MG tablet Take 1 tablet (1 mg total) by mouth daily.  90 tablet  3  . gemfibrozil (LOPID) 600 MG tablet Take 1 tablet (600 mg total) by mouth 2 (two) times daily before a meal.  180 tablet  3  . nitroGLYCERIN (NITROSTAT) 0.4 MG SL tablet Place 1 tablet (0.4 mg total) under the tongue every 5 (five) minutes as needed.  90 tablet  3  . potassium chloride (K-DUR) 10 MEQ tablet Take 1 tablet (10 mEq total) by mouth daily.  90 tablet  3  . simvastatin (ZOCOR) 20 MG tablet Take 1 tablet (20 mg total) by mouth at bedtime.  90 tablet  3  . traZODone (DESYREL) 50 MG tablet Take 50 mg by mouth daily as needed.         ROS: No nausea or vomiting. No fever or chills.No melena or hematochezia.No bleeding.No claudication  Physical Exam: BP 129/66  Pulse 69  Ht 6' (1.829 m)  Wt 184 lb (83.462 kg)  BMI 24.95 kg/m2 General:well-nourished white male in no distress. Neck:normal carotid upstroke and no carotid bruits.  No thyromegaly no nodular thyroid.  JVP is 6 cm Lungs:clear breath sounds bilaterally.  No wheezing Cardiac:regular rate and rhythm with normal S1 and S2 no murmur rubs or gallops Vascular:no edema.  Normal distal pulses Skin:warm and dry Physcologic:normal affect  12lead [id number] sinus rhythm with no acute changes or prior infarct patterns.  Rare PVCs. Limited bedside ECHO:N/A No images are attached to the encounter.   Assessment and Plan  Venous insufficiency of leg Patient has some mild edema associated with venous insufficiency of the  lower extremities.  No evidence of heart failure  HYPERTENSION Stable on current medical therapy.  Patient brought in several pages of a pressure readings all which were within normal limits  CAD (coronary artery disease) No recurrent chest pain.  Patient continues to work  out at [employer].  His last perfusion scan was August 10, 2008 and showed no ischemia.  Continue with risk factor modification and current medical therapy  Chronic cough Resolved.    Patient Active Problem List  Diagnoses  . LIPOMA OF UNSPECIFIED SITE  . B12 DEFICIENCY  . PURE HYPERCHOLESTEROLEMIA  . PERSISTENT DISORDER INITIATING/MAINTAINING SLEEP  . PERIPHERAL NEUROPATHY, FEET  . EYELID RETRACTION OR LAG  . HYPERTENSION  . DYSPEPSIA  . JOINT EFFUSION, LEFT KNEE  . SHOULDER PAIN, RIGHT, CHRONIC  . DEGENERATIVE DISC DISEASE, CERVICAL SPINE, W/RADICULOPATHY  . DIZZINESS  . Chronic cough  . PROSTATE CANCER, HX OF  . Venous insufficiency of leg  . Pulmonary nodule  . CAD (coronary artery disease)  . Renal insufficiency

## 2011-05-04 NOTE — Assessment & Plan Note (Signed)
No recurrent chest pain.  Patient continues to work out at [employer].  His last perfusion scan was August 10, 2008 and showed no ischemia.  Continue with risk factor modification and current medical therapy

## 2011-05-04 NOTE — Assessment & Plan Note (Signed)
Stable on current medical therapy.  Patient brought in several pages of a pressure readings all which were within normal limits

## 2011-05-10 ENCOUNTER — Ambulatory Visit (INDEPENDENT_AMBULATORY_CARE_PROVIDER_SITE_OTHER): Payer: Medicare Other | Admitting: Internal Medicine

## 2011-05-10 DIAGNOSIS — D649 Anemia, unspecified: Secondary | ICD-10-CM

## 2011-05-10 MED ORDER — CYANOCOBALAMIN 1000 MCG/ML IJ SOLN
1000.0000 ug | Freq: Once | INTRAMUSCULAR | Status: AC
  Administered 2011-05-10: 1000 ug via INTRAMUSCULAR

## 2011-06-12 ENCOUNTER — Ambulatory Visit (INDEPENDENT_AMBULATORY_CARE_PROVIDER_SITE_OTHER): Payer: Medicare Other | Admitting: *Deleted

## 2011-06-12 ENCOUNTER — Other Ambulatory Visit: Payer: Self-pay | Admitting: Internal Medicine

## 2011-06-12 ENCOUNTER — Other Ambulatory Visit (INDEPENDENT_AMBULATORY_CARE_PROVIDER_SITE_OTHER): Payer: Medicare Other

## 2011-06-12 DIAGNOSIS — E785 Hyperlipidemia, unspecified: Secondary | ICD-10-CM

## 2011-06-12 DIAGNOSIS — I10 Essential (primary) hypertension: Secondary | ICD-10-CM

## 2011-06-12 DIAGNOSIS — E7800 Pure hypercholesterolemia, unspecified: Secondary | ICD-10-CM

## 2011-06-12 DIAGNOSIS — E538 Deficiency of other specified B group vitamins: Secondary | ICD-10-CM

## 2011-06-12 LAB — VITAMIN B12: Vitamin B-12: 390 pg/mL (ref 211–911)

## 2011-06-12 LAB — BASIC METABOLIC PANEL
BUN: 34 mg/dL — ABNORMAL HIGH (ref 6–23)
Chloride: 113 mEq/L — ABNORMAL HIGH (ref 96–112)
Creatinine, Ser: 2 mg/dL — ABNORMAL HIGH (ref 0.4–1.5)

## 2011-06-12 LAB — HEPATIC FUNCTION PANEL
ALT: 12 U/L (ref 0–53)
Total Bilirubin: 0.7 mg/dL (ref 0.3–1.2)

## 2011-06-12 LAB — LIPID PANEL
Cholesterol: 122 mg/dL (ref 0–200)
LDL Cholesterol: 69 mg/dL (ref 0–99)
Triglycerides: 77 mg/dL (ref 0.0–149.0)

## 2011-06-12 MED ORDER — CYANOCOBALAMIN 1000 MCG/ML IJ SOLN
1000.0000 ug | Freq: Once | INTRAMUSCULAR | Status: AC
  Administered 2011-06-12: 1000 ug via INTRAMUSCULAR

## 2011-06-15 ENCOUNTER — Encounter: Payer: Self-pay | Admitting: Internal Medicine

## 2011-06-21 ENCOUNTER — Telehealth: Payer: Self-pay | Admitting: Internal Medicine

## 2011-06-21 ENCOUNTER — Encounter: Payer: Self-pay | Admitting: Internal Medicine

## 2011-06-21 ENCOUNTER — Ambulatory Visit (INDEPENDENT_AMBULATORY_CARE_PROVIDER_SITE_OTHER): Payer: Medicare Other | Admitting: Internal Medicine

## 2011-06-21 VITALS — BP 122/70 | HR 68 | Temp 98.6°F | Resp 16 | Ht 72.0 in | Wt 178.0 lb

## 2011-06-21 DIAGNOSIS — R05 Cough: Secondary | ICD-10-CM

## 2011-06-21 NOTE — Telephone Encounter (Signed)
Pt would like to be worked in.  He has a cough.He also jabbing pains on the left side for the last 2-3 days.  These can happen at any time, sitting doing nothing or walking.  Also when he coughs.

## 2011-06-21 NOTE — Patient Instructions (Addendum)
Cough - no signs of infection. This very well may be allergic rhinitis, triggered by who knows what. We will go with what has worked: take loratdine 10 mg once a day (non-sedating antihistamine), sudafed 30 mg two or at most three times a day, continue the flonase. You can also try a little robitussin DM.  Chest wall pain - no clinical evidence of heart symptoms. This is a pulled muscle between the ribs in the left side. Plan - lineament of choice (doctor likes aspercreme), heat patch and maybe an NSAID.

## 2011-06-21 NOTE — Telephone Encounter (Signed)
Call came to [facility] as Emergent; call d/c when answered when answered by triager. Called pt back. Pt states Izora Gala in office called him back stating to come to office, MD will see him. Pt getting dressed now and wife will drive him to office ASAP. Pt states he did not feel like he needed 911 at this time. Sounded SOB; hestated he had a stuffy nose. Advised to call 911 at anytime if sxs worsened. Pt verbalized understanding.

## 2011-06-21 NOTE — Telephone Encounter (Signed)
Ok to work in this AM

## 2011-06-21 NOTE — Progress Notes (Signed)
Subjective:    Patient ID: [name], male    DOB: [birth year], 78 y.o.   MRN: [id number]  HPI [name] has a history of a prolonged cough that finally responded to dyphenhydramine/phenylephrine and flonase nasal spray in Oct '12. He has done well until several days ago when he had the sudden onset of sinus congestion and rhinorrhea and now cough. He denies any fever, chills, SOB and his cough is occasionally productive of clear mucus. This is similar to what he had before.  For 5 days he has had intermittent sharp stabbing pain in the left chest, fleeting - up to 30 seconds, no associated SOB, diaphoresis, radiation to arm or jaw. Does not feel like previous episodes of angina. There is some association with coughing or movement.   Past Medical History  Diagnosis Date  . Prostate cancer   . Hypertension     well-controlled.  [name] CAD (coronary artery disease)     S/P stenting to mid RCA, prox PDA 06/1999;  06/2007 Myoview: negative except for apical thinning, EF 68%, last Myoview August 10, 2008 with no significant ischemia  . Renal insufficiency     stable with a creatinine around 1.9-2.0 followed by nephrology.  . Exertional angina     Resolved  . Dyslipidemia   . Neck injury     C3-C4 and C4-C5 foraminal narrowing, severe  . Renal artery stenosis     50% by cath  . Pulmonary nodule     felt to be noncancerous.  Status post followup CT scan 4 mm and stable.  . Chronic UTI    Past Surgical History  Procedure Date  . S/p ptca   . Cardia catherization 07-07-99  . Peripheral vascular catherization 11-24-03  . Renal circulation 10-01-03  . Stress cardiolite 05-04-05    spring 09-negative except for apical thinning, EF 68%  . Edg 07-17-1994  . Flexible sigmoidoscopy 11-03-1997  . Prostatectomy   . Lumbar spinal disk and neck fusion surgery   . Stents     X 2   Family History  Problem Relation Age of Onset  . Coronary artery disease Father   . Cancer Brother     Bladder   . Diabetes Neg Hx   . Prostate cancer Brother   . Nephritis Brother   . Other Brother     cerebral hemorrhage  . Other Mother     cerebral hemorrhage  . Heart disease Brother   . Arthritis Mother   . Breast cancer Other     neice x 2   History   Social History  . Marital Status: Married    Spouse Name: N/A    Number of Children: 2  . Years of Education: N/A   Occupational History  . 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     Social History Main Topics  . Smoking status: Never Smoker   . Smokeless tobacco: Never Used  . Alcohol Use: Yes     Rarely  . Drug Use: Not on file  . Sexually Active: Not on file   Other Topics Concern  . Not on file   Social History Narrative   HSG, 1 year college.  married '52 - 3 years, divorced; married '56 - 3 years divorced; married '76-12 yrs - divorced; married '75 -. 1 son '57; 1 daughter - '53;  1 grandchild.  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  Very happily married.  End of life care: yes CPR, no long term mechanical ventilation, no heroic measures.       Review of Systems System review is negative for any constitutional, cardiac, pulmonary, GI or neuro symptoms or complaints other than as described in the HPI.     Objective:   Physical Exam Filed Vitals:   06/21/11 1049  BP: 122/70  Pulse: 68  Temp: 98.6 F (37 C)  Resp: 16   Gen'l - WNWD white man in no distress HEENT- throat clear Pulm - normal respirations, no rales or wheezes Chest - very tender in the intercostal space left axillary line      Assessment & Plan:  1. Cough - he has had a chronic cough in the past. Last fall Dr. Dannielle Burn had good results treating him for allergic rhinitis with nasal steroid spray, antihistamine and decongestant. He has restarted the flonase nasal spray.  Plan  claritin 10 mg daily  Sudafed 30 mg bid  or tid  Continue flonase  Use nasal saline  Call if no relief.  2. Chest wall pain - strain from coughing . Nothing cardiac about his symptoms  Plan -  lineament of choice  NSAID use - with caution

## 2011-06-21 NOTE — Telephone Encounter (Signed)
APPT TODAY

## 2011-07-13 ENCOUNTER — Ambulatory Visit (INDEPENDENT_AMBULATORY_CARE_PROVIDER_SITE_OTHER): Payer: Medicare Other | Admitting: *Deleted

## 2011-07-13 DIAGNOSIS — E538 Deficiency of other specified B group vitamins: Secondary | ICD-10-CM

## 2011-07-16 MED ORDER — CYANOCOBALAMIN 1000 MCG/ML IJ SOLN
1000.0000 ug | Freq: Once | INTRAMUSCULAR | Status: AC
  Administered 2011-07-16: 1000 ug via INTRAMUSCULAR

## 2011-08-14 ENCOUNTER — Ambulatory Visit (INDEPENDENT_AMBULATORY_CARE_PROVIDER_SITE_OTHER): Payer: Medicare Other | Admitting: *Deleted

## 2011-08-14 DIAGNOSIS — E538 Deficiency of other specified B group vitamins: Secondary | ICD-10-CM

## 2011-08-14 MED ORDER — CYANOCOBALAMIN 1000 MCG/ML IJ SOLN
1000.0000 ug | Freq: Once | INTRAMUSCULAR | Status: AC
  Administered 2011-08-14: 1000 ug via INTRAMUSCULAR

## 2011-09-13 ENCOUNTER — Ambulatory Visit (INDEPENDENT_AMBULATORY_CARE_PROVIDER_SITE_OTHER): Payer: Medicare Other | Admitting: *Deleted

## 2011-09-13 DIAGNOSIS — Z23 Encounter for immunization: Secondary | ICD-10-CM

## 2011-09-13 DIAGNOSIS — E538 Deficiency of other specified B group vitamins: Secondary | ICD-10-CM

## 2011-09-13 MED ORDER — CYANOCOBALAMIN 1000 MCG/ML IJ SOLN
1000.0000 ug | Freq: Once | INTRAMUSCULAR | Status: AC
  Administered 2011-09-13: 1000 ug via INTRAMUSCULAR

## 2011-10-11 ENCOUNTER — Ambulatory Visit (INDEPENDENT_AMBULATORY_CARE_PROVIDER_SITE_OTHER): Payer: Medicare Other | Admitting: *Deleted

## 2011-10-11 DIAGNOSIS — E538 Deficiency of other specified B group vitamins: Secondary | ICD-10-CM

## 2011-10-11 MED ORDER — CYANOCOBALAMIN 1000 MCG/ML IJ SOLN
1000.0000 ug | Freq: Once | INTRAMUSCULAR | Status: AC
  Administered 2011-10-11: 1000 ug via INTRAMUSCULAR

## 2011-10-15 ENCOUNTER — Telehealth: Payer: Self-pay | Admitting: Internal Medicine

## 2011-10-15 ENCOUNTER — Inpatient Hospital Stay (HOSPITAL_COMMUNITY)
Admission: EM | Admit: 2011-10-15 | Discharge: 2011-10-24 | DRG: 234 | Disposition: A | Discharge status: Home / Self Care | Payer: Medicare Other | Attending: Surgery | Admitting: Surgery

## 2011-10-15 ENCOUNTER — Emergency Department (HOSPITAL_COMMUNITY): Payer: Medicare Other

## 2011-10-15 ENCOUNTER — Encounter (HOSPITAL_COMMUNITY): Payer: Self-pay | Admitting: Emergency Medicine

## 2011-10-15 DIAGNOSIS — Z8546 Personal history of malignant neoplasm of prostate: Secondary | ICD-10-CM

## 2011-10-15 DIAGNOSIS — N183 Chronic kidney disease, stage 3 unspecified: Secondary | ICD-10-CM | POA: Diagnosis present

## 2011-10-15 DIAGNOSIS — I10 Essential (primary) hypertension: Secondary | ICD-10-CM

## 2011-10-15 DIAGNOSIS — I701 Atherosclerosis of renal artery: Secondary | ICD-10-CM | POA: Diagnosis present

## 2011-10-15 DIAGNOSIS — N390 Urinary tract infection, site not specified: Secondary | ICD-10-CM

## 2011-10-15 DIAGNOSIS — I4891 Unspecified atrial fibrillation: Secondary | ICD-10-CM | POA: Diagnosis not present

## 2011-10-15 DIAGNOSIS — E8779 Other fluid overload: Secondary | ICD-10-CM | POA: Diagnosis not present

## 2011-10-15 DIAGNOSIS — I214 Non-ST elevation (NSTEMI) myocardial infarction: Principal | ICD-10-CM

## 2011-10-15 DIAGNOSIS — Z9861 Coronary angioplasty status: Secondary | ICD-10-CM

## 2011-10-15 DIAGNOSIS — I129 Hypertensive chronic kidney disease with stage 1 through stage 4 chronic kidney disease, or unspecified chronic kidney disease: Secondary | ICD-10-CM | POA: Diagnosis present

## 2011-10-15 DIAGNOSIS — E785 Hyperlipidemia, unspecified: Secondary | ICD-10-CM | POA: Diagnosis present

## 2011-10-15 DIAGNOSIS — E876 Hypokalemia: Secondary | ICD-10-CM | POA: Diagnosis not present

## 2011-10-15 DIAGNOSIS — I4892 Unspecified atrial flutter: Secondary | ICD-10-CM | POA: Diagnosis not present

## 2011-10-15 DIAGNOSIS — Z79899 Other long term (current) drug therapy: Secondary | ICD-10-CM

## 2011-10-15 DIAGNOSIS — D62 Acute posthemorrhagic anemia: Secondary | ICD-10-CM | POA: Diagnosis not present

## 2011-10-15 DIAGNOSIS — I2510 Atherosclerotic heart disease of native coronary artery without angina pectoris: Secondary | ICD-10-CM

## 2011-10-15 DIAGNOSIS — Z7982 Long term (current) use of aspirin: Secondary | ICD-10-CM

## 2011-10-15 DIAGNOSIS — E7800 Pure hypercholesterolemia, unspecified: Secondary | ICD-10-CM

## 2011-10-15 HISTORY — DX: Chronic kidney disease, stage 3 unspecified: N18.30

## 2011-10-15 HISTORY — DX: Chronic kidney disease, stage 3 (moderate): N18.3

## 2011-10-15 LAB — CBC WITH DIFFERENTIAL/PLATELET
Basophils Absolute: 0 K/uL (ref 0.0–0.1)
Basophils Relative: 0 % (ref 0–1)
Eosinophils Absolute: 0.1 K/uL (ref 0.0–0.7)
Eosinophils Relative: 1 % (ref 0–5)
HCT: 40.4 % (ref 39.0–52.0)
Hemoglobin: 13.1 g/dL (ref 13.0–17.0)
Lymphocytes Relative: 18 % (ref 12–46)
Lymphs Abs: 1.2 K/uL (ref 0.7–4.0)
MCH: 25.4 pg — ABNORMAL LOW (ref 26.0–34.0)
MCHC: 32.4 g/dL (ref 30.0–36.0)
MCV: 78.3 fL (ref 78.0–100.0)
Monocytes Absolute: 0.7 K/uL (ref 0.1–1.0)
Monocytes Relative: 11 % (ref 3–12)
Neutro Abs: 4.7 K/uL (ref 1.7–7.7)
Neutrophils Relative %: 70 % (ref 43–77)
Platelets: 234 K/uL (ref 150–400)
RBC: 5.16 MIL/uL (ref 4.22–5.81)
RDW: 14.5 % (ref 11.5–15.5)
WBC: 6.8 K/uL (ref 4.0–10.5)

## 2011-10-15 LAB — URINALYSIS, ROUTINE W REFLEX MICROSCOPIC
Glucose, UA: NEGATIVE mg/dL
pH: 6 (ref 5.0–8.0)

## 2011-10-15 LAB — BASIC METABOLIC PANEL
CO2: 21 mEq/L (ref 19–32)
Calcium: 9.9 mg/dL (ref 8.4–10.5)
Glucose, Bld: 76 mg/dL (ref 70–99)
Sodium: 138 mEq/L (ref 135–145)

## 2011-10-15 LAB — URINE MICROSCOPIC-ADD ON

## 2011-10-15 MED ORDER — AMLODIPINE BESYLATE 10 MG PO TABS
10.0000 mg | ORAL_TABLET | Freq: Every day | ORAL | Status: DC
  Administered 2011-10-16 – 2011-10-18 (×3): 10 mg via ORAL
  Filled 2011-10-15 (×4): qty 1

## 2011-10-15 MED ORDER — SODIUM CHLORIDE 0.9 % IV SOLN
250.0000 mL | INTRAVENOUS | Status: DC | PRN
  Administered 2011-10-19: 12:00:00 via INTRAVENOUS

## 2011-10-15 MED ORDER — DEXTROSE 5 % IV SOLN: 1.0000 g | Freq: Once | INTRAVENOUS | Status: DC

## 2011-10-15 MED ORDER — DIAZEPAM 5 MG PO TABS
5.0000 mg | ORAL_TABLET | ORAL | Status: AC
  Administered 2011-10-16: 08:00:00 5 mg via ORAL
  Filled 2011-10-15: qty 1

## 2011-10-15 MED ORDER — METOPROLOL TARTRATE 12.5 MG HALF TABLET
12.5000 mg | ORAL_TABLET | Freq: Two times a day (BID) | ORAL | Status: DC
  Administered 2011-10-15 – 2011-10-18 (×7): 12.5 mg via ORAL
  Filled 2011-10-15 (×10): qty 1

## 2011-10-15 MED ORDER — NITROGLYCERIN 2 % TD OINT
1.0000 [in_us] | TOPICAL_OINTMENT | Freq: Four times a day (QID) | TRANSDERMAL | Status: DC
  Administered 2011-10-15 – 2011-10-18 (×14): 1 [in_us] via TOPICAL
  Filled 2011-10-15: qty 30

## 2011-10-15 MED ORDER — SODIUM CHLORIDE 0.9 % IV SOLN
INTRAVENOUS | Status: DC
  Administered 2011-10-15: 21:00:00 via INTRAVENOUS

## 2011-10-15 MED ORDER — ALPRAZOLAM 0.25 MG PO TABS
0.2500 mg | ORAL_TABLET | Freq: Two times a day (BID) | ORAL | Status: DC | PRN
  Administered 2011-10-16: 0.25 mg via ORAL
  Filled 2011-10-15: qty 1

## 2011-10-15 MED ORDER — ASPIRIN BUFFERED 325 MG PO TABS: 325.0000 mg | ORAL_TABLET | Freq: Every day | ORAL | Status: DC

## 2011-10-15 MED ORDER — SODIUM CHLORIDE 0.9 % IJ SOLN: 3.0000 mL | Freq: Two times a day (BID) | INTRAMUSCULAR | Status: DC

## 2011-10-15 MED ORDER — GEMFIBROZIL 600 MG PO TABS
600.0000 mg | ORAL_TABLET | Freq: Two times a day (BID) | ORAL | Status: DC
  Administered 2011-10-16 – 2011-10-24 (×14): 600 mg via ORAL
  Filled 2011-10-15 (×20): qty 1

## 2011-10-15 MED ORDER — NITROGLYCERIN 0.4 MG SL SUBL
0.4000 mg | SUBLINGUAL_TABLET | SUBLINGUAL | Status: DC | PRN
  Administered 2011-10-16: 0.4 mg via SUBLINGUAL
  Filled 2011-10-15: qty 25

## 2011-10-15 MED ORDER — FLUTICASONE PROPIONATE 50 MCG/ACT NA SUSP
1.0000 | Freq: Two times a day (BID) | NASAL | Status: DC | PRN
  Filled 2011-10-15: qty 16

## 2011-10-15 MED ORDER — HEPARIN BOLUS VIA INFUSION
4000.0000 [IU] | Freq: Once | INTRAVENOUS | Status: AC
  Administered 2011-10-15: 4000 [IU] via INTRAVENOUS

## 2011-10-15 MED ORDER — ZOLPIDEM TARTRATE 5 MG PO TABS
5.0000 mg | ORAL_TABLET | Freq: Every evening | ORAL | Status: DC | PRN
  Administered 2011-10-18: 5 mg via ORAL
  Filled 2011-10-15: qty 1

## 2011-10-15 MED ORDER — ACETAMINOPHEN 325 MG PO TABS
650.0000 mg | ORAL_TABLET | ORAL | Status: DC | PRN
  Administered 2011-10-16: 06:00:00 650 mg via ORAL
  Filled 2011-10-15: qty 2

## 2011-10-15 MED ORDER — SODIUM CHLORIDE 0.9 % IV SOLN: 250.0000 mL | INTRAVENOUS | Status: DC | PRN

## 2011-10-15 MED ORDER — ASPIRIN EC 325 MG PO TBEC
325.0000 mg | DELAYED_RELEASE_TABLET | Freq: Every day | ORAL | Status: DC
  Administered 2011-10-16 – 2011-10-18 (×3): 325 mg via ORAL
  Filled 2011-10-15 (×4): qty 1

## 2011-10-15 MED ORDER — SODIUM CHLORIDE 0.9 % IJ SOLN
3.0000 mL | Freq: Two times a day (BID) | INTRAMUSCULAR | Status: DC
  Administered 2011-10-16 – 2011-10-18 (×4): 3 mL via INTRAVENOUS

## 2011-10-15 MED ORDER — ONDANSETRON HCL 4 MG/2ML IJ SOLN: 4.0000 mg | Freq: Four times a day (QID) | INTRAMUSCULAR | Status: DC | PRN

## 2011-10-15 MED ORDER — FOLIC ACID 1 MG PO TABS
1.0000 mg | ORAL_TABLET | Freq: Every day | ORAL | Status: DC
  Administered 2011-10-16 – 2011-10-18 (×3): 1 mg via ORAL
  Filled 2011-10-15 (×4): qty 1

## 2011-10-15 MED ORDER — POTASSIUM CHLORIDE ER 10 MEQ PO TBCR
10.0000 meq | EXTENDED_RELEASE_TABLET | Freq: Every day | ORAL | Status: DC
  Administered 2011-10-16 – 2011-10-18 (×3): 10 meq via ORAL
  Filled 2011-10-15 (×4): qty 1

## 2011-10-15 MED ORDER — SODIUM CHLORIDE 0.9 % IJ SOLN: 3.0000 mL | INTRAMUSCULAR | Status: DC | PRN

## 2011-10-15 MED ORDER — HEPARIN (PORCINE) IN NACL 100-0.45 UNIT/ML-% IJ SOLN
1000.0000 [IU]/h | INTRAMUSCULAR | Status: DC
  Administered 2011-10-15: 1000 [IU]/h via INTRAVENOUS
  Filled 2011-10-15 (×3): qty 250

## 2011-10-15 MED ORDER — SIMVASTATIN 20 MG PO TABS
20.0000 mg | ORAL_TABLET | Freq: Every day | ORAL | Status: DC
  Administered 2011-10-15 – 2011-10-16 (×2): 20 mg via ORAL
  Filled 2011-10-15 (×4): qty 1

## 2011-10-15 NOTE — ED Notes (Signed)
Pt reports sternal pain and pressure and left lateral stabbing rib pain. Pt reports "indigestion" started Saturday and was relieved with Tums. Pain returned this AM when pt pulled trash can from curb to house.

## 2011-10-15 NOTE — Telephone Encounter (Signed)
The pt's wife called and wanted to let you know the patient is at the [facility].  He went in with s.o.b. and pain in chest.

## 2011-10-15 NOTE — Telephone Encounter (Signed)
Noted. Will follow up.

## 2011-10-15 NOTE — Progress Notes (Addendum)
ANTICOAGULATION CONSULT NOTE - Initial Consult  Pharmacy Consult for Heparin Indication: chest pain/ACS  Allergies  Allergen Reactions  . Amoxicillin   . Aspirin     REACTION: upset stomach  . Atarax (Hydroxyzine Hcl)   . Ciprofloxacin   . Codeine     REACTION: Stomach upset  . Hydrocodone   . Macrobid (Nitrofurantoin Macrocrystal)   . Niacin   . Niacin-Lovastatin Er   . Nitrofurantoin   . Trimox (Amoxicillin Trihydrate)     Patient Measurements:  Ht 72in Wt: 81.6kg Heparin Dosing Weight: 81.6kg  Vital Signs: Temp: 98.4 F (36.9 C) (10/07 1124) Temp src: Oral (10/07 1124) BP: 144/79 mmHg (10/07 1124) Pulse Rate: 61  (10/07 1124)  Labs:  Basename 10/15/11 1234  HGB 13.1  HCT 40.4  PLT 234  APTT --  LABPROT --  INR --  HEPARINUNFRC --  CREATININE 2.02*  CKTOTAL --  CKMB --  TROPONINI 7.65*    The CrCl is unknown because both a height and weight (above a minimum accepted value) are required for this calculation.   Medical History: Past Medical History  Diagnosis Date  . Prostate cancer   . Hypertension     well-controlled.  [name] CAD (coronary artery disease)     S/P stenting to mid RCA, prox PDA 06/1999;  06/2007 Myoview: negative except for apical thinning, EF 68%, last Myoview August 10, 2008 with no significant ischemia  . Renal insufficiency     stable with a creatinine around 1.9-2.0 followed by nephrology.  . Exertional angina     Resolved  . Dyslipidemia   . Neck injury     C3-C4 and C4-C5 foraminal narrowing, severe  . Renal artery stenosis     50% by cath  . Pulmonary nodule     felt to be noncancerous.  Status post followup CT scan 4 mm and stable.  . Chronic UTI     Medications:  See electronic Med Rec  Assessment: 85yom to start heparin for CP/NSTEMI. Patient reports no bleeding and not taking any anticoagulants. - Baseline INR: 1.13 - H/H and Plts wnl - Heparin wt: 81.6kg  Goal of Therapy:  Heparin level 0.3-0.7  units/ml Monitor platelets by anticoagulation protocol: Yes   Plan:  1. Heparin IV bolus 4000 units x 1 2. Heparin drip 1000 units/hr (10 ml/hr) 3. Check heparin level 8 hours after initiation 4. Daily heparin level and CBC  Earleen Newport [id number] 10/15/2011,3:08 PM

## 2011-10-15 NOTE — H&P (Signed)
Patient ID: [name] MRN: [id number], DOB/AGE: 07/14/1926   Admit date: 10/15/2011   Primary Physician: Adella Hare, MD Primary Cardiologist: Previously followed in [place] office  Pt. Profile:  78 y/o male with h/o CAD s/p BMS to RCA in 2001 who presented to ED earlier today with a 3 day h/o intermittent chest pain and has an elevated troponin.  Problem List  Past Medical History  Diagnosis Date  . Prostate cancer     a. 2001 s/p TURP.  [name] Hypertension     well-controlled.  [name] CAD (coronary artery disease)     a. S/P stenting to mid RCA, prox PDA 06/1999;  06/2007 Myoview: negative except for apical thinning, EF 68%, last Myoview 08/2008 w/o significant ischemia  . CKD (chronic kidney disease), stage III     a. stable with a creatinine around 1.9-2.0 followed by nephrology.  . Dyslipidemia   . Neck injury     a. C3-C4 and C4-C5 foraminal narrowing, severe  . Renal artery stenosis     a. 50% by cath 2001  . Pulmonary nodule     a. felt to be noncancerous.  Status post followup CT scan 4 mm and stable.  . Chronic UTI     a. Followed by Dr. Risa Grill - colonized/asymptomatic - not on abx    Past Surgical History  Procedure Date  . S/p ptca   . Cardia catherization 07-07-99  . Peripheral vascular catherization 11-24-03  . Renal circulation 10-01-03  . Stress cardiolite 05-04-05    spring 09-negative except for apical thinning, EF 68%  . Edg 07-17-1994  . Flexible sigmoidoscopy 11-03-1997  . Prostatectomy   . Lumbar spinal disk and neck fusion surgery   . Stents     X 2    Allergies  Allergies  Allergen Reactions  . Amoxicillin   . Aspirin     REACTION: upset stomach  . Atarax (Hydroxyzine Hcl)   . Ciprofloxacin   . Codeine     REACTION: Stomach upset  . Hydrocodone   . Macrobid (Nitrofurantoin Macrocrystal)   . Niacin   . Niacin-Lovastatin Er   . Nitrofurantoin   . Trimox (Amoxicillin Trihydrate)    HPI  78 y/o male with the above problem list.  He  was in his USOH until about 1 week ago, when he began to note mild DOE.  Starting on Saturday 10/5, he also began to experience rest and exertional substernal chest pressure associated with dyspnea and "stomach grumbling."  He thought that maybe these Ss represented indigestion and he began taking Tums PRN with relief of discomfort within a few mins.  Ss recurred all weekend long and yesterday afternoon he had a more severe spell, lasting approx 5 mins.  This AM, he walked out to his curb to get his garbage can and upon walking it back up the driveway, he noted more severe sscp associated with profound DOE.  Symptoms again lasted a few mins and resolved with rest.  Following this episode, he decided to present to the ED where his ECG showed very mild ST elevation in I and V6 along with minimal ST depression in V2 and V3.  His troponin is elevated @ 7.65.  He is currently pain free.  Creat is 2.02.  Home Medications  Prior to Admission medications   Medication Sig Start Date End Date Taking? Authorizing Provider  amLODipine (NORVASC) 10 MG tablet Take 1 tablet (10 mg total) by mouth daily. 01/12/11 01/12/12 Yes Legrand Como  E Norins, MD  aspirin 325 MG buffered tablet Take 325 mg by mouth daily.     Yes Historical Provider, MD  CYANOCOBALAMIN IJ Inject 1 mL as directed every 30 (thirty) days.    Yes Historical Provider, MD  ergocalciferol (VITAMIN D2) 50000 UNITS capsule Take 50,000 Units by mouth every 30 (thirty) days.     Yes Historical Provider, MD  folic acid (FOLVITE) 1 MG tablet Take 1 tablet (1 mg total) by mouth daily. 09/08/10  Yes Neena Rhymes, MD  gemfibrozil (LOPID) 600 MG tablet Take 1 tablet (600 mg total) by mouth 2 (two) times daily before a meal. 02/06/11  Yes Neena Rhymes, MD  potassium chloride (K-DUR) 10 MEQ tablet Take 1 tablet (10 mEq total) by mouth daily. 01/12/11 01/12/12 Yes Neena Rhymes, MD  simvastatin (ZOCOR) 20 MG tablet Take 1 tablet (20 mg total) by mouth at bedtime. 01/12/11   Yes Neena Rhymes, MD  clotrimazole-betamethasone (LOTRISONE) cream Apply 1 application topically 2 (two) times daily as needed. 07/03/10   Neena Rhymes, MD  fluticasone (FLONASE) 50 MCG/ACT nasal spray Place 1 spray into the nose 2 (two) times daily as needed. For cold symptoms    Historical Provider, MD  nitroGLYCERIN (NITROSTAT) 0.4 MG SL tablet Place 1 tablet (0.4 mg total) under the tongue every 5 (five) minutes as needed. 07/03/10   Neena Rhymes, MD   Family History  Family History  Problem Relation Age of Onset  . Coronary artery disease Father     died @ 82  . Cancer Brother     Bladder  . Diabetes Neg Hx   . Prostate cancer Brother   . Nephritis Brother     died @ age 63.  . Other Brother     cerebral hemorrhage - died @ 30  . Other Mother     cerebral hemorrhage - died @ 61  . Heart disease Brother   . Arthritis Mother   . Breast cancer Other     niece x 2   Social History  History   Social History  . Marital Status: Married    Spouse Name: N/A    Number of Children: 2  . Years of Education: N/A   Occupational History  . 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     Social History Main Topics  . Smoking status: Never Smoker   . Smokeless tobacco: Never Used  . Alcohol Use: Yes     Rarely  . Drug Use: No  . Sexually Active: Not on file   Other Topics Concern  . Not on file   Social History Narrative   HSG, 1 year college.  married '52 - 3 years, divorced; married '56 - 3 years divorced; married '19-12 yrs - divorced; married '75 -. 1 son '57; 1 daughter - '53; 1 grandchild.  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  Very happily married.  End of life care: yes CPR, no long term mechanical ventilation, no heroic measures.    Review of Systems General:  No chills, fever, night sweats or weight changes.  Cardiovascular:   +++ chest pain & dyspnea on exertion.  No edema, orthopnea, palpitations, paroxysmal nocturnal dyspnea. Dermatological: No rash, lesions/masses Respiratory: No cough, dyspnea Urologic: No hematuria, dysuria Abdominal:   No nausea,  vomiting, diarrhea, bright red blood per rectum, melena, or hematemesis Neurologic:  No visual changes, wkns, changes in mental status. All other systems reviewed and are otherwise negative except as noted above.  Physical Exam  Blood pressure 144/79, pulse 61, temperature 98.4 F (36.9 C), temperature source Oral, resp. rate 20, SpO2 100.00%.  General: Pleasant, NAD Psych: Normal affect. Neuro: Alert and oriented X 3. Moves all extremities spontaneously. HEENT: Normal  Neck: Supple without bruits or JVD. Lungs:  Resp regular and unlabored, CTA. Heart: RRR no s3, s4, or murmurs. Abdomen: Soft, non-tender, non-distended, BS + x 4.  Extremities: No clubbing, cyanosis or edema. DP/PT/Radials 2+ and equal bilaterally.  Labs  Basename 10/15/11 1234  CKTOTAL --  CKMB --  TROPONINI 7.65*   Lab Results  Component Value Date   WBC 6.8 10/15/2011   HGB 13.1 10/15/2011   HCT 40.4 10/15/2011   MCV 78.3 10/15/2011   PLT 234 10/15/2011    Lab 10/15/11 1234  NA 138  K 4.4  CL 105  CO2 21  BUN 30*  CREATININE 2.02*  CALCIUM 9.9  PROT --  BILITOT --  ALKPHOS --  ALT --  AST --  GLUCOSE 76   Radiology/Studies  Dg Chest Port 1 View  10/15/2011  *RADIOLOGY REPORT*  Clinical Data: Chest pain and shortness of breath  PORTABLE CHEST - 1 VIEW  Comparison: 07/18/2010  Findings: The heart and pulmonary vascularity are within normal limits.  The lungs are clear bilaterally.  No bony abnormality is seen.  IMPRESSION: No acute abnormality noted.   Original Report Authenticated By: Everlene Farrier, M.D.    ECG  Sb, 59, lad, lafb, lae.  No acute st/t changes.  ASSESSMENT AND PLAN  1.  NSTEMI/CAD:  3 day h/o intermittent rest and exertional chest pain culminating  in more severe exertional Ss this AM.  ECG and troponin are abnl, though he is currently pain free.  With his elevated creatinine, we will admit him to stepdown tonight.  Add IV heparin, low-dose bb, and ntg paste.  Cont asa, statin.  Begin hydration tonight and plan on catheterization tomorrow or sooner if he has recurrent/recalcitrant chest pain.  2.  HTN:  BP currently elevated in ED.  Cont home norvasc dose and add low-dose bb.  Follow.  3.  HL:  LDL 69 in 06/2011. Cont statin therapy.  4.  CKD III:  Creat at baseline.  Hydrate tonight in preparation for cath tomorrow.  Signed, Murray Hodgkins, NP 10/15/2011, 4:29 PM'   Attending Note:   The patient was seen and examined.  Agree with assessment and plan as noted above. Pt has a hx of CAD with previous stenting of his RCA in 2001.  He was noted to have a small vessel with disease " behind his heart" which was treated medically.    This past Saturday ( 2 days ago), he developed some intermittant CP / tightness .  He had more progressive dyspnea 1 day ago.  He presented to the ER today . His CP has resolved by this point. He is currently comfortable  Exam is as above.  Pulses are quite strong.  ECG : NSR, very mild ST depression in the anterior leads with a trace amount of ST elevation in V6.  Troponin is elevated - 7.65. He has moderate renal insufficiency - Cr = 2.02  He will need a cardiac cath tomorrow.  He will need hydration starting now to minimize the risk of contrast induced  nephropathy.   Will continue IV heparin. Add NTP if needed.  He is currently pain free and does not need NTG drip at this point.    I have added him to the cath schedule for Dr. Martinique. I have discussed risks, benefits, options.  He understands and agrees to proceed.  Thayer Headings, Brooke Bonito., MD, [facility] 10/15/2011, 5:01 PM

## 2011-10-15 NOTE — ED Notes (Signed)
EDP notified of critical troponin  

## 2011-10-15 NOTE — ED Notes (Signed)
Pt c/o chest pain with SOB and pressure over the last 3 weeks. EMS gave BASA x4 prior to ED arrival. Pt appears anxious, tearful and denies pain at this time. 20g Lt FA SL by EMS. Pt hx of cardiac stents.

## 2011-10-15 NOTE — ED Notes (Signed)
Pt refused Rocephin stating "I have had a UTI for the last year. My nephrologist and Primary MD don't want me to start antibiotics for it so I don't build up a tolerance."

## 2011-10-15 NOTE — ED Provider Notes (Addendum)
History     CSN: [id number]  Arrival date & time 10/15/11  1123   First MD Initiated Contact with Patient 10/15/11 1129      Chief Complaint  Patient presents with  . Chest Pain    (Consider location/radiation/quality/duration/timing/severity/associated sxs/prior treatment) HPI Comments: 78 y/o male w/ Hx of CAD comes in with cc of chest pain.  Sx started about 3 days ago, but he had some sob with exertion about a week ago. Starting on Saturday 10/5, he also began to experience rest and exertional substernal chest pressure associated with dyspnea and "stomach grumbling."  He thought that maybe these Ss represented indigestion and he began taking Tums PRN with relief of discomfort within a few mins.  Ss recurred all weekend long and yesterday afternoon he had a more severe spell, lasting approx 5 mins.  This AM, he walked out to his curb to get his garbage can and upon walking it back up the driveway, he noted more severe sscp associated with profound DOE.  Patient is a 78 y.o. male presenting with chest pain. The history is provided by the patient.  Chest Pain Primary symptoms include fatigue, shortness of breath and dizziness. Pertinent negatives for primary symptoms include no fever and no cough.     Past Medical History  Diagnosis Date  . Prostate cancer   . Hypertension     well-controlled.  [name] CAD (coronary artery disease)     S/P stenting to mid RCA, prox PDA 06/1999;  06/2007 Myoview: negative except for apical thinning, EF 68%, last Myoview August 10, 2008 with no significant ischemia  . Renal insufficiency     stable with a creatinine around 1.9-2.0 followed by nephrology.  . Exertional angina     Resolved  . Dyslipidemia   . Neck injury     C3-C4 and C4-C5 foraminal narrowing, severe  . Renal artery stenosis     50% by cath  . Pulmonary nodule     felt to be noncancerous.  Status post followup CT scan 4 mm and stable.  . Chronic UTI     Past Surgical History    Procedure Date  . S/p ptca   . Cardia catherization 07-07-99  . Peripheral vascular catherization 11-24-03  . Renal circulation 10-01-03  . Stress cardiolite 05-04-05    spring 09-negative except for apical thinning, EF 68%  . Edg 07-17-1994  . Flexible sigmoidoscopy 11-03-1997  . Prostatectomy   . Lumbar spinal disk and neck fusion surgery   . Stents     X 2    Family History  Problem Relation Age of Onset  . Coronary artery disease Father   . Cancer Brother     Bladder  . Diabetes Neg Hx   . Prostate cancer Brother   . Nephritis Brother   . Other Brother     cerebral hemorrhage  . Other Mother     cerebral hemorrhage  . Heart disease Brother   . Arthritis Mother   . Breast cancer Other     neice x 2    History  Substance Use Topics  . Smoking status: Never Smoker   . Smokeless tobacco: Never Used  . Alcohol Use: Yes     Rarely      Review of Systems  Constitutional: Positive for fatigue. Negative for fever, chills and activity change.  HENT: Negative for neck pain.   Eyes: Negative for visual disturbance.  Respiratory: Positive for shortness of breath. Negative for cough  and chest tightness.   Cardiovascular: Positive for chest pain.  Gastrointestinal: Negative for abdominal distention.  Genitourinary: Negative for dysuria, enuresis and difficulty urinating.  Musculoskeletal: Negative for arthralgias.  Neurological: Positive for dizziness. Negative for light-headedness and headaches.  Psychiatric/Behavioral: Negative for confusion.    Allergies  Amoxicillin; Aspirin; Atarax; Ciprofloxacin; Codeine; Hydrocodone; Macrobid; Niacin; Niacin-lovastatin er; Nitrofurantoin; and Trimox  Home Medications   Current Outpatient Rx  Name Route Sig Dispense Refill  . AMLODIPINE BESYLATE 10 MG PO TABS Oral Take 1 tablet (10 mg total) by mouth daily. 90 tablet 3  . ASPIRIN BUFFERED 325 MG PO TABS Oral Take 325 mg by mouth daily.      . CYANOCOBALAMIN IJ Injection Inject  1 mL as directed every 30 (thirty) days.     . ERGOCALCIFEROL 50000 UNITS PO CAPS Oral Take 50,000 Units by mouth every 30 (thirty) days.      [name] FOLIC ACID 1 MG PO TABS Oral Take 1 tablet (1 mg total) by mouth daily. 90 tablet 3  . GEMFIBROZIL 600 MG PO TABS Oral Take 1 tablet (600 mg total) by mouth 2 (two) times daily before a meal. 180 tablet 3  . POTASSIUM CHLORIDE ER 10 MEQ PO TBCR Oral Take 1 tablet (10 mEq total) by mouth daily. 90 tablet 3  . SIMVASTATIN 20 MG PO TABS Oral Take 1 tablet (20 mg total) by mouth at bedtime. 90 tablet 3  . CLOTRIMAZOLE-BETAMETHASONE 1-0.05 % EX CREA Topical Apply 1 application topically 2 (two) times daily as needed. 60 g 3  . FLUTICASONE PROPIONATE 50 MCG/ACT NA SUSP Nasal Place 1 spray into the nose 2 (two) times daily as needed. For cold symptoms    . NITROGLYCERIN 0.4 MG SL SUBL Sublingual Place 1 tablet (0.4 mg total) under the tongue every 5 (five) minutes as needed. 90 tablet 3    BP 144/79  Pulse 61  Temp 98.4 F (36.9 C) (Oral)  Resp 20  SpO2 100%  Physical Exam  Nursing note and vitals reviewed. Constitutional: He is oriented to person, place, and time. He appears well-developed.  HENT:  Head: Normocephalic and atraumatic.  Eyes: Conjunctivae normal and EOM are normal. Pupils are equal, round, and reactive to light.  Neck: Normal range of motion. Neck supple.  Cardiovascular: Normal rate and regular rhythm.   Pulmonary/Chest: Effort normal and breath sounds normal.  Abdominal: Soft. Bowel sounds are normal. He exhibits no distension. There is no tenderness. There is no rebound and no guarding.  Musculoskeletal: He exhibits no edema.  Neurological: He is alert and oriented to person, place, and time.  Skin: Skin is warm.    ED Course  Procedures (including critical care time)  Labs Reviewed  CBC WITH DIFFERENTIAL - Abnormal; Notable for the following:    MCH 25.4 (*)     All other components within normal limits  BASIC METABOLIC  PANEL - Abnormal; Notable for the following:    BUN 30 (*)     Creatinine, Ser 2.02 (*)     GFR calc non Af Amer 29 (*)     GFR calc Af Amer 33 (*)     All other components within normal limits  TROPONIN I - Abnormal; Notable for the following:    Troponin I 7.65 (*)     All other components within normal limits  URINALYSIS, ROUTINE W REFLEX MICROSCOPIC   Dg Chest Port 1 View  10/15/2011  *RADIOLOGY REPORT*  Clinical Data: Chest pain and shortness  of breath  PORTABLE CHEST - 1 VIEW  Comparison: 07/18/2010  Findings: The heart and pulmonary vascularity are within normal limits.  The lungs are clear bilaterally.  No bony abnormality is seen.  IMPRESSION: No acute abnormality noted.   Original Report Authenticated By: Everlene Farrier, M.D.      No diagnosis found.    MDM  Differential diagnosis includes: ACS syndrome CHF exacerbation Valvular disorder Myocarditis Pericarditis Pericardial effusion Pneumonia Pleural effusion Pulmonary edema PE Anemia Musculoskeletal pain   Date: 10/15/2011  Rate: 59  Rhythm: normal sinus rhythm  QRS Axis: left  Intervals: normal  ST/T Wave abnormalities: nonspecific ST/T changes  Conduction Disutrbances:left anterior fascicular block  Narrative Interpretation:   Old EKG Reviewed: unchanged   Pt comes in with cc of chest pain and sob with exertion. Concerns for ACS and unstable angina  - however, his troponni came back elevated. He was anticoagulated, and Cardiology informed immediately    Varney Biles, MD 10/15/11 1407  CRITICAL CARE Performed by: Varney Biles   Total critical care time: 35 min  Critical care time was exclusive of separately billable procedures and treating other patients.  Critical care was necessary to treat or prevent imminent or life-threatening deterioration.  Critical care was time spent personally by me on the following activities: development of treatment plan with patient and/or surrogate as  well as nursing, discussions with consultants, evaluation of patient's response to treatment, examination of patient, obtaining history from patient or surrogate, ordering and performing treatments and interventions, ordering and review of laboratory studies, ordering and review of radiographic studies, pulse oximetry and re-evaluation of patient's condition.   Varney Biles, MD 11/10/11 [id number]  Varney Biles, MD 11/10/11 [id number]

## 2011-10-16 ENCOUNTER — Encounter (HOSPITAL_COMMUNITY)
Admission: EM | Disposition: A | Discharge status: Home / Self Care | Payer: Self-pay | Source: Home / Self Care | Attending: Surgery

## 2011-10-16 ENCOUNTER — Inpatient Hospital Stay (HOSPITAL_COMMUNITY): Payer: Medicare Other

## 2011-10-16 ENCOUNTER — Other Ambulatory Visit: Payer: Self-pay | Admitting: *Deleted

## 2011-10-16 DIAGNOSIS — I2510 Atherosclerotic heart disease of native coronary artery without angina pectoris: Secondary | ICD-10-CM

## 2011-10-16 DIAGNOSIS — I517 Cardiomegaly: Secondary | ICD-10-CM

## 2011-10-16 HISTORY — PX: LEFT HEART CATHETERIZATION WITH CORONARY ANGIOGRAM: SHX5451

## 2011-10-16 LAB — TSH: TSH: 1.823 u[IU]/mL (ref 0.350–4.500)

## 2011-10-16 LAB — CBC
HCT: 37.1 % — ABNORMAL LOW (ref 39.0–52.0)
Hemoglobin: 12.1 g/dL — ABNORMAL LOW (ref 13.0–17.0)
MCV: 78.3 fL (ref 78.0–100.0)
RDW: 14.6 % (ref 11.5–15.5)
WBC: 5.9 10*3/uL (ref 4.0–10.5)

## 2011-10-16 LAB — BASIC METABOLIC PANEL
CO2: 21 mEq/L (ref 19–32)
Chloride: 108 mEq/L (ref 96–112)
Creatinine, Ser: 1.84 mg/dL — ABNORMAL HIGH (ref 0.50–1.35)
GFR calc Af Amer: 37 mL/min — ABNORMAL LOW (ref >90)
Potassium: 4.2 mEq/L (ref 3.5–5.1)

## 2011-10-16 LAB — TROPONIN I: Troponin I: 4.71 ng/mL (ref <0.30)

## 2011-10-16 SURGERY — LEFT HEART CATHETERIZATION WITH CORONARY ANGIOGRAM
Anesthesia: LOCAL

## 2011-10-16 MED ORDER — SODIUM CHLORIDE 0.9 % IV SOLN: 1.0000 mL/kg/h | INTRAVENOUS | Status: AC

## 2011-10-16 MED ORDER — MIDAZOLAM HCL 2 MG/2ML IJ SOLN
INTRAMUSCULAR | Status: AC
  Filled 2011-10-16: qty 2

## 2011-10-16 MED ORDER — NITROGLYCERIN 0.2 MG/ML ON CALL CATH LAB
INTRAVENOUS | Status: AC
  Filled 2011-10-16: qty 1

## 2011-10-16 MED ORDER — ~~LOC~~ CARDIAC SURGERY, PATIENT & FAMILY EDUCATION
Freq: Once | Status: AC
  Administered 2011-10-16: 22:00:00
  Filled 2011-10-16: qty 1

## 2011-10-16 MED ORDER — MORPHINE SULFATE 2 MG/ML IJ SOLN
2.0000 mg | INTRAMUSCULAR | Status: DC | PRN
  Administered 2011-10-18: 04:00:00 2 mg via INTRAVENOUS
  Filled 2011-10-16: qty 1

## 2011-10-16 MED ORDER — LIDOCAINE HCL (PF) 1 % IJ SOLN
INTRAMUSCULAR | Status: AC
  Filled 2011-10-16: qty 30

## 2011-10-16 MED ORDER — FENTANYL CITRATE 0.05 MG/ML IJ SOLN
INTRAMUSCULAR | Status: AC
  Filled 2011-10-16: qty 2

## 2011-10-16 MED ORDER — HEPARIN (PORCINE) IN NACL 2-0.9 UNIT/ML-% IJ SOLN
INTRAMUSCULAR | Status: AC
  Filled 2011-10-16: qty 1000

## 2011-10-16 MED ORDER — HEPARIN SODIUM (PORCINE) 1000 UNIT/ML IJ SOLN
INTRAMUSCULAR | Status: AC
  Filled 2011-10-16: qty 1

## 2011-10-16 MED ORDER — HEPARIN (PORCINE) IN NACL 100-0.45 UNIT/ML-% IJ SOLN
1000.0000 [IU]/h | INTRAMUSCULAR | Status: DC
  Administered 2011-10-16: 18:00:00 1000 [IU]/h via INTRAVENOUS
  Filled 2011-10-16 (×2): qty 250

## 2011-10-16 MED ORDER — VERAPAMIL HCL 2.5 MG/ML IV SOLN
INTRAVENOUS | Status: AC
  Filled 2011-10-16: qty 2

## 2011-10-16 NOTE — H&P (View-Only) (Signed)
PROGRESS NOTE  Subjective:   Pt was admitted with several days of CP and DOE.  Found to have elevated troponin levels.  Scheduled for cath this am.  Objective:    Vital Signs:   Temp:  [98.4 F (36.9 C)-98.9 F (37.2 C)] 98.9 F (37.2 C) (10/08 0525) Pulse Rate:  [61-73] 69  (10/08 0525) Resp:  [12-22] 22  (10/08 0525) BP: (101-144)/(59-79) 116/64 mmHg (10/08 0525) SpO2:  [94 %-100 %] 95 % (10/08 0525) Weight:  [177 lb 0.5 oz (80.3 kg)] 177 lb 0.5 oz (80.3 kg) (10/08 0020)      24-hour weight change: Weight change:   Weight trends: Filed Weights   10/16/11 0020  Weight: 177 lb 0.5 oz (80.3 kg)    Intake/Output:  10/07 0701 - 10/08 0700 In: 480 [I.V.:480] Out: 550 [Urine:550]     Physical Exam: BP 116/64  Pulse 69  Temp 98.9 F (37.2 C) (Oral)  Resp 22  Wt 177 lb 0.5 oz (80.3 kg)  SpO2 95%  General: Vital signs reviewed and noted. Well-developed, well-nourished, in no acute distress; alert, appropriate and cooperative .  Head: Normocephalic, atraumatic.  Eyes: conjunctivae/corneas clear.  EOM's intact.   Throat: normal  Neck: Supple. Normal carotids. No JVD  Lungs:  Clear to auscultation  Heart: Regular rate,  With normal  S1 S2. No murmurs, gallops or rubs  Abdomen:  Soft, non-tender, non-distended with normoactive bowel sounds. No hepatomegaly. No rebound/guarding. No abdominal masses.  Extremities: Distal pedal pulses are 2+ .  No edema.    Neurologic: A&O X3, CN II - XII are grossly intact. Motor strength is 5/5 in the all 4 extremities.  Psych: Responds to questions appropriately with normal affect.    Labs: BMET:  Basename 10/15/11 1234  NA 138  K 4.4  CL 105  CO2 21  GLUCOSE 76  BUN 30*  CREATININE 2.02*  CALCIUM 9.9  MG --  PHOS --    Liver function tests: No results found for this basename: AST:2,ALT:2,ALKPHOS:2,BILITOT:2,PROT:2,ALBUMIN:2 in the last 72 hours No results found for this basename: LIPASE:2,AMYLASE:2 in the last 72  hours  CBC:  Basename 10/16/11 0645 10/15/11 1234  WBC 5.9 6.8  NEUTROABS -- 4.7  HGB 12.1* 13.1  HCT 37.1* 40.4  MCV 78.3 78.3  PLT 215 234    Cardiac Enzymes:  Basename 10/16/11 0645 10/16/11 0001 10/15/11 2012 10/15/11 1613  CKTOTAL -- -- -- --  CKMB -- -- -- --  TROPONINI 4.71* 5.03* 6.73* 5.11*    Coagulation Studies:  Basename 10/15/11 1438  LABPROT 14.3  INR 1.13    Other: No components found with this basename: POCBNP:3 No results found for this basename: DDIMER in the last 72 hours No results found for this basename: HGBA1C in the last 72 hours No results found for this basename: CHOL,HDL,LDLCALC,TRIG,CHOLHDL in the last 72 hours  Basename 10/15/11 2012  TSH 1.823  T4TOTAL --  T3FREE --  THYROIDAB --   No results found for this basename: VITAMINB12,FOLATE,FERRITIN,TIBC,IRON,RETICCTPCT in the last 72 hours   Tele:  NSR  Medications:    Infusions:    . sodium chloride 50 mL/hr at 10/15/11 2036  . heparin 1,000 Units/hr (10/16/11 0600)    Scheduled Medications:    . amLODipine  10 mg Oral Daily  . aspirin EC  325 mg Oral Daily  . cefTRIAXone (ROCEPHIN)  IV  1 g Intravenous Once  . diazepam  5 mg Oral On Call  . folic acid  1 mg Oral Daily  . gemfibrozil  600 mg Oral BID AC  . heparin  4,000 Units Intravenous Once  . metoprolol tartrate  12.5 mg Oral BID  . nitroGLYCERIN  1 inch Topical Q6H  . potassium chloride  10 mEq Oral Daily  . simvastatin  20 mg Oral QHS  . sodium chloride  3 mL Intravenous Q12H  . sodium chloride  3 mL Intravenous Q12H  . DISCONTD: aspirin  325 mg Oral Daily    Assessment/ Plan:    1. CAD : Troponin levels are elevated but coming down - supporting the idea that he had his MI several days ago.  For cath this AM if BMP shows renal fxn to be acceptable.  Further plans per Dr. Martinique after cath.  Gave him some ice chips for his dry mouth  Disposition: cath this am. Length of Stay: 1  Thayer Headings, Brooke Bonito.,  MD, [facility] 10/16/2011, 7:49 AM Office [phone] Pager [phone]

## 2011-10-16 NOTE — Progress Notes (Signed)
Pt began having chest pain this morning around 6 am.  Pt rated it 1.5/10.  Ekg done showing min 1 block st elevation in v1.  Previous ekg with elevation in v1 and v6.  Pt and wife very nervous.  Pt states this pain feels different-midsternal to the left chest.  Vss.  Nitropaste applied as scheduled, and tylenol and xanax given.  1 sl ntg given as well.  No diaphoresis or crushing pain. Ignacia Bayley PA notified of above and updated.  Morphine ordered prn if pt wishes to take it.  Will cont to monitor closely.

## 2011-10-16 NOTE — Consult Note (Signed)
[address]            [place] [address]          [phone]      Reason for Consult:  Severe 3-vessel coronary occlusive disease s/p NSTEMI Referring Physician:  Dr. Peter Martinique  [name] is an 78 y.o. male.  HPI:   Patient is an 73-yr-old gentleman with a hx of CAD s/p stenting of the mid-RCA and PDA in 2001. His last cath in 2009 showed patent stents and no reconstructable disease. His last myoview exam in 08/2008 showed no significant ischemia.  He has had intermittent dyspnea with exertion over the past year. On Saturday he began having intermittent SSCP and dyspnea with exertion and at rest. It waxed and waned Saturday and Sunday. Then Monday he had recurrent pain and came to ER. He ruled in for NSTEMI.  He has Stage III CKD with creat about 2. Cath was done today showing severe 3 vessel CAD as noted below.  Past Medical History  Diagnosis Date  . Prostate cancer     a. 2001 s/p TURP.  [name] Hypertension     well-controlled.  [name] CAD (coronary artery disease)     a. S/P stenting to mid RCA, prox PDA 06/1999;  06/2007 Myoview: negative except for apical thinning, EF 68%, last Myoview 08/2008 w/o significant ischemia  . CKD (chronic kidney disease), stage III     a. stable with a creatinine around 1.9-2.0 followed by nephrology.  . Dyslipidemia   . Neck injury     a. C3-C4 and C4-C5 foraminal narrowing, severe  . Renal artery stenosis     a. 50% by cath 2001  . Pulmonary nodule     a. felt to be noncancerous.  Status post followup CT scan 4 mm and stable.  . Chronic UTI     a. Followed by Dr. Risa Grill - colonized/asymptomatic - not on abx    Past Surgical History  Procedure Date  . S/p ptca   . Cardia catherization 07-07-99  . Peripheral vascular catherization 11-24-03  . Renal circulation 10-01-03  . Stress cardiolite 05-04-05    spring 09-negative except for apical thinning, EF 68%  . Edg 07-17-1994  . Flexible sigmoidoscopy  11-03-1997  . Prostatectomy   . Lumbar spinal disk and neck fusion surgery   . Stents     X 2    Family History  Problem Relation Age of Onset  . Coronary artery disease Father     died @ 59  . Cancer Brother     Bladder  . Diabetes Neg Hx   . Prostate cancer Brother   . Nephritis Brother     died @ age 97.  . Other Brother     cerebral hemorrhage - died @ 14  . Other Mother     cerebral hemorrhage - died @ 20  . Heart disease Brother   . Arthritis Mother   . Breast cancer Other     niece x 2    Social History:  reports that he has never smoked. He has never used smokeless tobacco. He reports that he drinks alcohol. He reports that he does not use illicit drugs.  Allergies:  Allergies  Allergen Reactions  . Amoxicillin   . Aspirin     REACTION: upset stomach  . Atarax (Hydroxyzine Hcl)   . Ciprofloxacin   .  Codeine     REACTION: Stomach upset  . Hydrocodone   . Macrobid (Nitrofurantoin Macrocrystal)   . Niacin   . Niacin-Lovastatin Er   . Nitrofurantoin   . Trimox (Amoxicillin Trihydrate)     Medications:  I have reviewed the patient's current medications. Prior to Admission:  Prescriptions prior to admission  Medication Sig Dispense Refill  . amLODipine (NORVASC) 10 MG tablet Take 1 tablet (10 mg total) by mouth daily.  90 tablet  3  . aspirin 325 MG buffered tablet Take 325 mg by mouth daily.        . CYANOCOBALAMIN IJ Inject 1 mL as directed every 30 (thirty) days.       . ergocalciferol (VITAMIN D2) 50000 UNITS capsule Take 50,000 Units by mouth every 30 (thirty) days.        . folic acid (FOLVITE) 1 MG tablet Take 1 tablet (1 mg total) by mouth daily.  90 tablet  3  . gemfibrozil (LOPID) 600 MG tablet Take 1 tablet (600 mg total) by mouth 2 (two) times daily before a meal.  180 tablet  3  . potassium chloride (K-DUR) 10 MEQ tablet Take 1 tablet (10 mEq total) by mouth daily.  90 tablet  3  . simvastatin (ZOCOR) 20 MG tablet Take 1 tablet (20 mg total) by  mouth at bedtime.  90 tablet  3  . clotrimazole-betamethasone (LOTRISONE) cream Apply 1 application topically 2 (two) times daily as needed.  60 g  3  . fluticasone (FLONASE) 50 MCG/ACT nasal spray Place 1 spray into the nose 2 (two) times daily as needed. For cold symptoms      . nitroGLYCERIN (NITROSTAT) 0.4 MG SL tablet Place 1 tablet (0.4 mg total) under the tongue every 5 (five) minutes as needed.  90 tablet  3   Scheduled:   . amLODipine  10 mg Oral Daily  . aspirin EC  325 mg Oral Daily  . cefTRIAXone (ROCEPHIN)  IV  1 g Intravenous Once  . diazepam  5 mg Oral On Call  . fentaNYL      . folic acid  1 mg Oral Daily  . gemfibrozil  600 mg Oral BID AC  . going for heart surgery book   Does not apply Once  . heparin      . heparin      . lidocaine      . metoprolol tartrate  12.5 mg Oral BID  . midazolam      . nitroGLYCERIN  1 inch Topical Q6H  . nitroGLYCERIN      . potassium chloride  10 mEq Oral Daily  . simvastatin  20 mg Oral QHS  . sodium chloride  3 mL Intravenous Q12H  . verapamil      . DISCONTD: aspirin  325 mg Oral Daily  . DISCONTD: sodium chloride  3 mL Intravenous Q12H   Continuous:   . sodium chloride 1 mL/kg/hr (10/16/11 1000)  . heparin 1,000 Units/hr (10/16/11 1730)  . DISCONTD: sodium chloride 50 mL/hr at 10/15/11 2036  . DISCONTD: heparin 1,000 Units/hr (10/16/11 0600)   [id number] chloride, acetaminophen, ALPRAZolam, fluticasone, morphine injection, nitroGLYCERIN, ondansetron (ZOFRAN) IV, sodium chloride, zolpidem, DISCONTD: sodium chloride, DISCONTD: sodium chloride  Results for orders placed during the hospital encounter of 10/15/11 (from the past 48 hour(s))  CBC WITH DIFFERENTIAL     Status: Abnormal   Collection Time   10/15/11 12:34 PM      Component Value Range Comment  WBC 6.8  4.0 - 10.5 K/uL    RBC 5.16  4.22 - 5.81 MIL/uL    Hemoglobin 13.1  13.0 - 17.0 g/dL    HCT 40.4  39.0 - 52.0 %    MCV 78.3  78.0 - 100.0 fL    MCH 25.4 (*) 26.0  - 34.0 pg    MCHC 32.4  30.0 - 36.0 g/dL    RDW 14.5  11.5 - 15.5 %    Platelets 234  150 - 400 K/uL    Neutrophils Relative 70  43 - 77 %    Neutro Abs 4.7  1.7 - 7.7 K/uL    Lymphocytes Relative 18  12 - 46 %    Lymphs Abs 1.2  0.7 - 4.0 K/uL    Monocytes Relative 11  3 - 12 %    Monocytes Absolute 0.7  0.1 - 1.0 K/uL    Eosinophils Relative 1  0 - 5 %    Eosinophils Absolute 0.1  0.0 - 0.7 K/uL    Basophils Relative 0  0 - 1 %    Basophils Absolute 0.0  0.0 - 0.1 K/uL   BASIC METABOLIC PANEL     Status: Abnormal   Collection Time   10/15/11 12:34 PM      Component Value Range Comment   Sodium 138  135 - 145 mEq/L    Potassium 4.4  3.5 - 5.1 mEq/L    Chloride 105  96 - 112 mEq/L    CO2 21  19 - 32 mEq/L    Glucose, Bld 76  70 - 99 mg/dL    BUN 30 (*) 6 - 23 mg/dL    Creatinine, Ser 2.02 (*) 0.50 - 1.35 mg/dL    Calcium 9.9  8.4 - 10.5 mg/dL    GFR calc non Af Amer 29 (*) >90 mL/min    GFR calc Af Amer 33 (*) >90 mL/min   TROPONIN I     Status: Abnormal   Collection Time   10/15/11 12:34 PM      Component Value Range Comment   Troponin I 7.65 (*) <0.30 ng/mL   URINALYSIS, ROUTINE W REFLEX MICROSCOPIC     Status: Abnormal   Collection Time   10/15/11  1:55 PM      Component Value Range Comment   Color, Urine YELLOW  YELLOW    APPearance CLOUDY (*) CLEAR    Specific Gravity, Urine 1.017  1.005 - 1.030    pH 6.0  5.0 - 8.0    Glucose, UA NEGATIVE  NEGATIVE mg/dL    Hgb urine dipstick NEGATIVE  NEGATIVE    Bilirubin Urine NEGATIVE  NEGATIVE    Ketones, ur NEGATIVE  NEGATIVE mg/dL    Protein, ur NEGATIVE  NEGATIVE mg/dL    Urobilinogen, UA 0.2  0.0 - 1.0 mg/dL    Nitrite POSITIVE (*) NEGATIVE    Leukocytes, UA MODERATE (*) NEGATIVE   URINE MICROSCOPIC-ADD ON     Status: Abnormal   Collection Time   10/15/11  1:55 PM      Component Value Range Comment   Squamous Epithelial / LPF RARE  RARE    WBC, UA 21-50  <3 WBC/hpf    Bacteria, UA MANY (*) RARE    Casts HYALINE CASTS  (*) NEGATIVE   URINE CULTURE     Status: Normal (Preliminary result)   Collection Time   10/15/11  1:55 PM      Component Value Range Comment  Specimen Description URINE, RANDOM      Special Requests ADDED AT [id number] ON [id number]      Culture  Setup Time 10/15/2011 19:05      Colony Count PENDING      Culture Culture reincubated for better growth      Report Status PENDING     PROTIME-INR     Status: Normal   Collection Time   10/15/11  2:38 PM      Component Value Range Comment   Prothrombin Time 14.3  11.6 - 15.2 seconds    INR 1.13  0.00 - 1.49   APTT     Status: Normal   Collection Time   10/15/11  2:38 PM      Component Value Range Comment   aPTT 36  24 - 37 seconds   TROPONIN I     Status: Abnormal   Collection Time   10/15/11  4:13 PM      Component Value Range Comment   Troponin I 5.11 (*) <0.30 ng/mL   TROPONIN I     Status: Abnormal   Collection Time   10/15/11  8:12 PM      Component Value Range Comment   Troponin I 6.73 (*) <0.30 ng/mL   TSH     Status: Normal   Collection Time   10/15/11  8:12 PM      Component Value Range Comment   TSH 1.823  0.350 - 4.500 uIU/mL   HEPARIN LEVEL (UNFRACTIONATED)     Status: Normal   Collection Time   10/16/11 12:01 AM      Component Value Range Comment   Heparin Unfractionated 0.42  0.30 - 0.70 IU/mL   TROPONIN I     Status: Abnormal   Collection Time   10/16/11 12:01 AM      Component Value Range Comment   Troponin I 5.03 (*) <0.30 ng/mL   HEPARIN LEVEL (UNFRACTIONATED)     Status: Normal   Collection Time   10/16/11  6:45 AM      Component Value Range Comment   Heparin Unfractionated 0.47  0.30 - 0.70 IU/mL   CBC     Status: Abnormal   Collection Time   10/16/11  6:45 AM      Component Value Range Comment   WBC 5.9  4.0 - 10.5 K/uL    RBC 4.74  4.22 - 5.81 MIL/uL    Hemoglobin 12.1 (*) 13.0 - 17.0 g/dL    HCT 37.1 (*) 39.0 - 52.0 %    MCV 78.3  78.0 - 100.0 fL    MCH 25.5 (*) 26.0 - 34.0 pg    MCHC 32.6  30.0 - 36.0 g/dL     RDW 14.6  11.5 - 15.5 %    Platelets 215  150 - 400 K/uL   TROPONIN I     Status: Abnormal   Collection Time   10/16/11  6:45 AM      Component Value Range Comment   Troponin I 4.71 (*) <0.30 ng/mL   BASIC METABOLIC PANEL     Status: Abnormal   Collection Time   10/16/11  6:45 AM      Component Value Range Comment   Sodium 139  135 - 145 mEq/L    Potassium 4.2  3.5 - 5.1 mEq/L    Chloride 108  96 - 112 mEq/L    CO2 21  19 - 32 mEq/L    Glucose, Bld 93  70 - 99  mg/dL    BUN 27 (*) 6 - 23 mg/dL    Creatinine, Ser 1.84 (*) 0.50 - 1.35 mg/dL    Calcium 9.3  8.4 - 10.5 mg/dL    GFR calc non Af Amer 32 (*) >90 mL/min    GFR calc Af Amer 37 (*) >90 mL/min     Dg Chest Port 1 View  10/15/2011  *RADIOLOGY REPORT*  Clinical Data: Chest pain and shortness of breath  PORTABLE CHEST - 1 VIEW  Comparison: 07/18/2010  Findings: The heart and pulmonary vascularity are within normal limits.  The lungs are clear bilaterally.  No bony abnormality is seen.  IMPRESSION: No acute abnormality noted.   Original Report Authenticated By: Everlene Farrier, M.D.     Review of Systems  Constitutional: Negative.   HENT: Negative.   Eyes: Negative.   Respiratory: Positive for shortness of breath. Negative for cough and hemoptysis.   Cardiovascular: Positive for chest pain. Negative for palpitations, orthopnea, claudication, leg swelling and PND.  Gastrointestinal: Negative.   Genitourinary: Negative.   Musculoskeletal: Negative.   Skin: Negative.   Neurological: Negative.   Endo/Heme/Allergies: Negative.   Psychiatric/Behavioral: Negative.    Blood pressure 146/64, pulse 68, temperature 97.5 F (36.4 C), temperature source Oral, resp. rate 21, height 6' (1.829 m), weight 80.3 kg (177 lb 0.5 oz), SpO2 96.00%. Physical Exam  Constitutional: He is oriented to person, place, and time. He appears well-developed and well-nourished.       Looks younger than his stated age.  HENT:  Head: Normocephalic and  atraumatic.  Mouth/Throat: Oropharynx is clear and moist.  Eyes: Conjunctivae normal and EOM are normal. Pupils are equal, round, and reactive to light.  Neck: Normal range of motion. No JVD present. No thyromegaly present.  Cardiovascular: Normal rate, regular rhythm, normal heart sounds and intact distal pulses.  Exam reveals no gallop and no friction rub.   No murmur heard. Respiratory: Effort normal and breath sounds normal. No respiratory distress.  GI: Soft. Bowel sounds are normal. He exhibits no distension and no mass. There is no tenderness.  Musculoskeletal: Normal range of motion. He exhibits no edema.  Lymphadenopathy:    He has cervical adenopathy.  Neurological: He is alert and oriented to person, place, and time. He has normal strength. No cranial nerve deficit or sensory deficit.  Skin: Skin is warm and dry.  Psychiatric: He has a normal mood and affect.   Cardiac Cath:  Procedural Findings: Hemodynamics: AO 88/49 with a mean of 69 mmHg LV 88/5 mmHg  Coronary angiography: Coronary dominance: right  Left mainstem: There is mild tapering of the distal left main of 20%. The left coronary that trifurcates into the LAD, intermediate, and circumflex vessels.  Left anterior descending (LAD): The LAD has moderate diffuse disease in the proximal vessel up to 70%. The mid to distal LAD is diffusely and severely diseased up to 90%. This is a long segment of disease. The first diagonal has a 50% disease proximally.  The ramus intermediate branch is moderate in size and without significant disease.  Left circumflex (LCx): The left circumflex coronary has a 7080% stenosis proximally. The circumflex gives rise to 2 marginal branches.  Right coronary artery (RCA): The right coronary is a dominant vessel. The proximal vessel there is only mild disease up to 30%. The stent in the mid vessel is widely patent. Following the stent however there is a long segment of disease of the proximal  up to 99%. There is  TIMI grade 2 flow distally. Prior to the bifurcation of the PDA and posterior lateral branches there is a 60% stenosis. The stent in the PDA is widely patent. The posterior lateral branch of the right coronary has an 80% stenosis.  Left ventriculography: Not performed  Final Conclusions:   1. Severe three-vessel obstructive coronary disease. There has been significant progression of disease compared to 2009.   Assessment/Plan:  He has severe 3-vessel CAD presenting with NSTEMI, certainly related to the 99% RCA stenosis. I agree that CABG is probably the best treatment for this gentleman with multivessel disease with relatively small, diffusely diseased vessels. The distal LAD is not graftable and diffusely diseased but I think I can get a graft into the mid LAD which will help this territory. His creatinine may rise post-cath so I would wait a few days to be sure it is stable.I discussed the operative procedure with the patient and family including alternatives, benefits and risks; including but not limited to bleeding, blood transfusion, infection, stroke, myocardial infarction, graft failure, heart block requiring a permanent pacemaker, organ dysfunction, and death.  Yvonne Kendall Herringshaw understands and agrees to proceed.  We will schedule surgery tentatively for Friday as long as his creatinine remains stable at baseline.  Dashay Giesler K 10/16/2011, 6:59 PM

## 2011-10-16 NOTE — Interval H&P Note (Signed)
History and Physical Interval Note:  10/16/2011 8:50 AM  [name]  has presented today for surgery, with the diagnosis of NSTEMI  The various methods of treatment have been discussed with the patient and family. After consideration of risks, benefits and other options for treatment, the patient has consented to  Procedure(s) (LRB) with comments: LEFT HEART CATHETERIZATION WITH CORONARY ANGIOGRAM (N/A) as a surgical intervention .  The patient's history has been reviewed, patient examined, no change in status, stable for surgery.  I have reviewed the patient's chart and labs.  Questions were answered to the patient's satisfaction.  Given renal insufficiency Will try and limit dye load. Will assess LV function with Echo.   Collier Salina [facility] 10/16/2011 8:50 AM

## 2011-10-16 NOTE — Progress Notes (Signed)
Courtesy: Chart reviewed: days of progressive c/p and SOB. Elevated troponin noted. He reports he did have recurrent c/p this AM better with nitro paste.  For cath this AM.  Will check back in PM after clinic  M.Miriana Gaertner [phone]

## 2011-10-16 NOTE — Progress Notes (Signed)
PROGRESS NOTE  Subjective:   Pt was admitted with several days of CP and DOE.  Found to have elevated troponin levels.  Scheduled for cath this am.  Objective:    Vital Signs:   Temp:  [98.4 F (36.9 C)-98.9 F (37.2 C)] 98.9 F (37.2 C) (10/08 0525) Pulse Rate:  [61-73] 69  (10/08 0525) Resp:  [12-22] 22  (10/08 0525) BP: (101-144)/(59-79) 116/64 mmHg (10/08 0525) SpO2:  [94 %-100 %] 95 % (10/08 0525) Weight:  [177 lb 0.5 oz (80.3 kg)] 177 lb 0.5 oz (80.3 kg) (10/08 0020)      24-hour weight change: Weight change:   Weight trends: Filed Weights   10/16/11 0020  Weight: 177 lb 0.5 oz (80.3 kg)    Intake/Output:  10/07 0701 - 10/08 0700 In: 480 [I.V.:480] Out: 550 [Urine:550]     Physical Exam: BP 116/64  Pulse 69  Temp 98.9 F (37.2 C) (Oral)  Resp 22  Wt 177 lb 0.5 oz (80.3 kg)  SpO2 95%  General: Vital signs reviewed and noted. Well-developed, well-nourished, in no acute distress; alert, appropriate and cooperative .  Head: Normocephalic, atraumatic.  Eyes: conjunctivae/corneas clear.  EOM's intact.   Throat: normal  Neck: Supple. Normal carotids. No JVD  Lungs:  Clear to auscultation  Heart: Regular rate,  With normal  S1 S2. No murmurs, gallops or rubs  Abdomen:  Soft, non-tender, non-distended with normoactive bowel sounds. No hepatomegaly. No rebound/guarding. No abdominal masses.  Extremities: Distal pedal pulses are 2+ .  No edema.    Neurologic: A&O X3, CN II - XII are grossly intact. Motor strength is 5/5 in the all 4 extremities.  Psych: Responds to questions appropriately with normal affect.    Labs: BMET:  Basename 10/15/11 1234  NA 138  K 4.4  CL 105  CO2 21  GLUCOSE 76  BUN 30*  CREATININE 2.02*  CALCIUM 9.9  MG --  PHOS --    Liver function tests: No results found for this basename: AST:2,ALT:2,ALKPHOS:2,BILITOT:2,PROT:2,ALBUMIN:2 in the last 72 hours No results found for this basename: LIPASE:2,AMYLASE:2 in the last 72  hours  CBC:  Basename 10/16/11 0645 10/15/11 1234  WBC 5.9 6.8  NEUTROABS -- 4.7  HGB 12.1* 13.1  HCT 37.1* 40.4  MCV 78.3 78.3  PLT 215 234    Cardiac Enzymes:  Basename 10/16/11 0645 10/16/11 0001 10/15/11 2012 10/15/11 1613  CKTOTAL -- -- -- --  CKMB -- -- -- --  TROPONINI 4.71* 5.03* 6.73* 5.11*    Coagulation Studies:  Basename 10/15/11 1438  LABPROT 14.3  INR 1.13    Other: No components found with this basename: POCBNP:3 No results found for this basename: DDIMER in the last 72 hours No results found for this basename: HGBA1C in the last 72 hours No results found for this basename: CHOL,HDL,LDLCALC,TRIG,CHOLHDL in the last 72 hours  Basename 10/15/11 2012  TSH 1.823  T4TOTAL --  T3FREE --  THYROIDAB --   No results found for this basename: VITAMINB12,FOLATE,FERRITIN,TIBC,IRON,RETICCTPCT in the last 72 hours   Tele:  NSR  Medications:    Infusions:    . sodium chloride 50 mL/hr at 10/15/11 2036  . heparin 1,000 Units/hr (10/16/11 0600)    Scheduled Medications:    . amLODipine  10 mg Oral Daily  . aspirin EC  325 mg Oral Daily  . cefTRIAXone (ROCEPHIN)  IV  1 g Intravenous Once  . diazepam  5 mg Oral On Call  . folic acid  1 mg Oral Daily  . gemfibrozil  600 mg Oral BID AC  . heparin  4,000 Units Intravenous Once  . metoprolol tartrate  12.5 mg Oral BID  . nitroGLYCERIN  1 inch Topical Q6H  . potassium chloride  10 mEq Oral Daily  . simvastatin  20 mg Oral QHS  . sodium chloride  3 mL Intravenous Q12H  . sodium chloride  3 mL Intravenous Q12H  . DISCONTD: aspirin  325 mg Oral Daily    Assessment/ Plan:    1. CAD : Troponin levels are elevated but coming down - supporting the idea that he had his MI several days ago.  For cath this AM if BMP shows renal fxn to be acceptable.  Further plans per Dr. Martinique after cath.  Gave him some ice chips for his dry mouth  Disposition: cath this am. Length of Stay: 1  Thayer Headings, Brooke Bonito.,  MD, [facility] 10/16/2011, 7:49 AM Office [phone] Pager [phone]

## 2011-10-16 NOTE — Progress Notes (Signed)
[place] for Heparin Indication: chest pain/ACS  Allergies  Allergen Reactions  . Amoxicillin   . Aspirin     REACTION: upset stomach  . Atarax (Hydroxyzine Hcl)   . Ciprofloxacin   . Codeine     REACTION: Stomach upset  . Hydrocodone   . Macrobid (Nitrofurantoin Macrocrystal)   . Niacin   . Niacin-Lovastatin Er   . Nitrofurantoin   . Trimox (Amoxicillin Trihydrate)     Patient Measurements: Weight: 177 lb 0.5 oz (80.3 kg)Ht 72in Wt: 81.6kg Heparin Dosing Weight: 81.6kg  Vital Signs: Temp: 98.9 F (37.2 C) (10/08 0020) Temp src: Oral (10/08 0020) BP: 106/59 mmHg (10/08 0020) Pulse Rate: 63  (10/08 0020)  Labs:  Basename 10/16/11 0001 10/15/11 2012 10/15/11 1613 10/15/11 1438 10/15/11 1234  HGB -- -- -- -- 13.1  HCT -- -- -- -- 40.4  PLT -- -- -- -- 234  APTT -- -- -- 36 --  LABPROT -- -- -- 14.3 --  INR -- -- -- 1.13 --  HEPARINUNFRC 0.42 -- -- -- --  CREATININE -- -- -- -- 2.02*  CKTOTAL -- -- -- -- --  CKMB -- -- -- -- --  TROPONINI -- 6.73* 5.11* -- 7.65*    The CrCl is unknown because both a height and weight (above a minimum accepted value) are required for this calculation.  Assessment: 78 yo male with chest pain for Heparin.   Goal of Therapy:  Heparin level 0.3-0.7 units/ml Monitor platelets by anticoagulation protocol: Yes   Plan:  Continue Heparin at current rate  Follow-up am labs or after cath.  Brayton Baumgartner, Bronson Curb 10/16/2011,12:50 AM

## 2011-10-16 NOTE — Progress Notes (Signed)
  Echocardiogram 2D Echocardiogram has been performed.  [name] 10/16/2011, 4:48 PM

## 2011-10-16 NOTE — CV Procedure (Signed)
   Cardiac Catheterization Procedure Note  Name: [name] MRN: [id number] DOB: 1926/05/13  Procedure: Left Heart Cath, Selective Coronary Angiography.  Indication: 78 year old white male with history of coronary disease status post stenting of the mid RCA and PDA 2001. He presents now with a non-ST elevation myocardial infarction. He has a history of chronic kidney disease with a baseline creatinine of 2. For this reason a left ventricular and gram was not performed.   Procedural Details: The right wrist was prepped, draped, and anesthetized with 1% lidocaine. Using the modified Seldinger technique, a 5 French sheath was introduced into the right radial artery. 3 mg of verapamil was administered through the sheath, weight-based unfractionated heparin was administered intravenously. Standard Judkins catheters were used for selective coronary angiography. Catheter exchanges were performed over an exchange length guidewire. There were no immediate procedural complications. A TR band was used for radial hemostasis at the completion of the procedure.  The patient was transferred to the post catheterization recovery area for further monitoring.  Procedural Findings: Hemodynamics: AO 88/49 with a mean of 69 mmHg LV 88/5 mmHg  Coronary angiography: Coronary dominance: right  Left mainstem: There is mild tapering of the distal left main of 20%. The left coronary that trifurcates into the LAD, intermediate, and circumflex vessels.  Left anterior descending (LAD): The LAD has moderate diffuse disease in the proximal vessel up to 70%. The mid to distal LAD is diffusely and severely diseased up to 90%. This is a long segment of disease. The first diagonal has a 50% disease proximally.  The ramus intermediate branch is moderate in size and without significant disease.  Left circumflex (LCx): The left circumflex coronary has a 7080% stenosis proximally. The circumflex gives rise to 2 marginal  branches.  Right coronary artery (RCA): The right coronary is a dominant vessel. The proximal vessel there is only mild disease up to 30%. The stent in the mid vessel is widely patent. Following the stent however there is a long segment of disease of the proximal up to 99%. There is TIMI grade 2 flow distally. Prior to the bifurcation of the PDA and posterior lateral branches there is a 60% stenosis. The stent in the PDA is widely patent. The posterior lateral branch of the right coronary has an 80% stenosis.  Left ventriculography: Not performed  Final Conclusions:   1. Severe three-vessel obstructive coronary disease. There has been significant progression of disease compared to 2009.  Recommendations: We will obtain an echocardiogram to assess LV function. Continue aggressive medical support. We'll obtain CT surgery consultation for consideration of bypass surgery.  Collier Salina [facility] 10/16/2011, 9:20 AM

## 2011-10-16 NOTE — Progress Notes (Signed)
ANTICOAGULATION CONSULT NOTE - Follow Up Consult  Pharmacy Consult for Heparin Indication: Severe 3V CAD while awaiting further plans  Allergies  Allergen Reactions  . Amoxicillin   . Aspirin     REACTION: upset stomach  . Atarax (Hydroxyzine Hcl)   . Ciprofloxacin   . Codeine     REACTION: Stomach upset  . Hydrocodone   . Macrobid (Nitrofurantoin Macrocrystal)   . Niacin   . Niacin-Lovastatin Er   . Nitrofurantoin   . Trimox (Amoxicillin Trihydrate)     Patient Measurements: Height: 6' (182.9 cm) Weight: 177 lb 0.5 oz (80.3 kg) IBW/kg (Calculated) : 77.6  Heparin Dosing Weight: 80.3 kg  Vital Signs: Temp: 97.8 F (36.6 C) (10/08 1231) Temp src: Oral (10/08 1231) BP: 131/57 mmHg (10/08 1231) Pulse Rate: 70  (10/08 1231)  Labs:  Basename 10/16/11 0645 10/16/11 0001 10/15/11 2012 10/15/11 1438 10/15/11 1234  HGB 12.1* -- -- -- 13.1  HCT 37.1* -- -- -- 40.4  PLT 215 -- -- -- 234  APTT -- -- -- 36 --  LABPROT -- -- -- 14.3 --  INR -- -- -- 1.13 --  HEPARINUNFRC 0.47 0.42 -- -- --  CREATININE 1.84* -- -- -- 2.02*  CKTOTAL -- -- -- -- --  CKMB -- -- -- -- --  TROPONINI 4.71* 5.03* 6.73* -- --    Estimated Creatinine Clearance: 33.4 ml/min (by C-G formula based on Cr of 1.84).   Assessment: 78 y.o. M who was found to have severe 3V CAD during cardiac cath today and is now awaiting CT surgery consult for possible bypass surgery. Pharmacy was consulted to resume heparin 8 hours post sheath removal. The sheath was noted to be taken out around 9:15  Goal of Therapy:  Heparin level 0.3-0.7 units/ml Monitor platelets by anticoagulation protocol: Yes   Plan:  1. Initiate heparin drip at rate of 1000 units/hr starting at 1715 this evening 2. Will continue to monitor for any signs/symptoms of bleeding and will follow up with heparin level in 8 hours   Alycia Rossetti, PharmD, BCPS Clinical Pharmacist Pager: [phone] 10/16/2011 1:16 PM

## 2011-10-16 NOTE — Progress Notes (Signed)
UR done. 

## 2011-10-16 NOTE — Progress Notes (Signed)
Pt still rating chest pain at 1.5/10 and does not want to take any morphine at this time-intolerant to codeine and hydrocodone.  Labs for this am somehow rescheduled by lab but have now been drawn so still awaiting results to give to jennifer o'neill.  Will monitor closely.

## 2011-10-17 DIAGNOSIS — I2510 Atherosclerotic heart disease of native coronary artery without angina pectoris: Secondary | ICD-10-CM

## 2011-10-17 DIAGNOSIS — N390 Urinary tract infection, site not specified: Secondary | ICD-10-CM

## 2011-10-17 LAB — CBC
Hemoglobin: 11.9 g/dL — ABNORMAL LOW (ref 13.0–17.0)
MCV: 78.3 fL (ref 78.0–100.0)
Platelets: 181 10*3/uL (ref 150–400)
RBC: 4.52 MIL/uL (ref 4.22–5.81)
WBC: 5.7 10*3/uL (ref 4.0–10.5)

## 2011-10-17 LAB — HEPARIN LEVEL (UNFRACTIONATED)
Heparin Unfractionated: 0.16 IU/mL — ABNORMAL LOW (ref 0.30–0.70)
Heparin Unfractionated: 0.35 IU/mL (ref 0.30–0.70)

## 2011-10-17 LAB — BASIC METABOLIC PANEL
CO2: 19 mEq/L (ref 19–32)
Chloride: 109 mEq/L (ref 96–112)
Sodium: 139 mEq/L (ref 135–145)

## 2011-10-17 MED ORDER — ROSUVASTATIN CALCIUM 10 MG PO TABS
10.0000 mg | ORAL_TABLET | Freq: Every day | ORAL | Status: DC
  Filled 2011-10-17: qty 1

## 2011-10-17 MED ORDER — HEPARIN (PORCINE) IN NACL 100-0.45 UNIT/ML-% IJ SOLN
1250.0000 [IU]/h | INTRAMUSCULAR | Status: DC
  Administered 2011-10-17 – 2011-10-18 (×2): 1250 [IU]/h via INTRAVENOUS
  Filled 2011-10-17 (×2): qty 250

## 2011-10-17 MED ORDER — ROSUVASTATIN CALCIUM 5 MG PO TABS
5.0000 mg | ORAL_TABLET | Freq: Every day | ORAL | Status: DC
  Administered 2011-10-17 – 2011-10-23 (×5): 5 mg via ORAL
  Filled 2011-10-17 (×8): qty 1

## 2011-10-17 MED ORDER — HEART ATTACK BOUNCING BOOK
Freq: Once | Status: AC
  Administered 2011-10-17
  Filled 2011-10-17: qty 1

## 2011-10-17 NOTE — Progress Notes (Signed)
PROGRESS NOTE  Subjective:    Pt was found to have 3 v cad.  Scheduled for CABG.  Objective:    Vital Signs:   Temp:  [97.5 F (36.4 C)-99.2 F (37.3 C)] 99.2 F (37.3 C) (10/09 0600) Pulse Rate:  [61-71] 70  (10/09 0600) Resp:  [13-22] 13  (10/09 0600) BP: (106-160)/(51-67) 137/62 mmHg (10/09 0600) SpO2:  [91 %-98 %] 98 % (10/09 0600) Weight:  [181 lb 7 oz (82.3 kg)] 181 lb 7 oz (82.3 kg) (10/09 0035)  Last BM Date: 10/15/11   24-hour weight change: Weight change: 4 lb 6.6 oz (2 kg)  Weight trends: Filed Weights   10/16/11 0020 10/17/11 0035  Weight: 177 lb 0.5 oz (80.3 kg) 181 lb 7 oz (82.3 kg)    Intake/Output:  10/08 0701 - 10/09 0700 In: 1327.7 [P.O.:360; I.V.:967.7] Out: 700 [Urine:700]     Physical Exam: BP 137/62  Pulse 70  Temp 99.2 F (37.3 C) (Oral)  Resp 13  Ht 6' (1.829 m)  Wt 181 lb 7 oz (82.3 kg)  BMI 24.61 kg/m2  SpO2 98%  General: Vital signs reviewed and noted. Well-developed, well-nourished, in no acute distress; alert, appropriate and cooperative .  Head: Normocephalic, atraumatic.  Eyes: conjunctivae/corneas clear.  EOM's intact.   Throat: normal  Neck: Supple. Normal carotids. No JVD  Lungs:  Clear to auscultation  Heart: Regular rate,  With normal  S1 S2. No murmurs, gallops or rubs  Abdomen:  Soft, non-tender, non-distended with normoactive bowel sounds. No hepatomegaly. No rebound/guarding. No abdominal masses.  Extremities: Distal pedal pulses are 2+ .  No edema.  Right radial cath site is ok  Neurologic: A&O X3, CN II - XII are grossly intact. Motor strength is 5/5 in the all 4 extremities.  Psych: Responds to questions appropriately with normal affect.    Labs: BMET:  Basename 10/17/11 0118 10/16/11 0645  NA 139 139  K 3.9 4.2  CL 109 108  CO2 19 21  GLUCOSE 99 93  BUN 26* 27*  CREATININE 1.89* 1.84*  CALCIUM 8.9 9.3  MG -- --  PHOS -- --    Liver function tests: No results found for this basename:  AST:2,ALT:2,ALKPHOS:2,BILITOT:2,PROT:2,ALBUMIN:2 in the last 72 hours No results found for this basename: LIPASE:2,AMYLASE:2 in the last 72 hours  CBC:  Basename 10/17/11 0118 10/16/11 0645 10/15/11 1234  WBC 5.7 5.9 --  NEUTROABS -- -- 4.7  HGB 11.9* 12.1* --  HCT 35.4* 37.1* --  MCV 78.3 78.3 --  PLT 181 215 --    Cardiac Enzymes:  Basename 10/16/11 0645 10/16/11 0001 10/15/11 2012 10/15/11 1613  CKTOTAL -- -- -- --  CKMB -- -- -- --  TROPONINI 4.71* 5.03* 6.73* 5.11*    Coagulation Studies:  Basename 10/15/11 1438  LABPROT 14.3  INR 1.13     Basename 10/15/11 2012  TSH 1.823  T4TOTAL --  T3FREE --  THYROIDAB --   No results found for this basename: VITAMINB12,FOLATE,FERRITIN,TIBC,IRON,RETICCTPCT in the last 72 hours   Tele:  NSR  Medications:    Infusions:    . sodium chloride 1 mL/kg/hr (10/16/11 1000)  . heparin 1,000 Units/hr (10/16/11 1730)  . DISCONTD: sodium chloride 50 mL/hr at 10/15/11 2036  . DISCONTD: heparin 1,000 Units/hr (10/16/11 0600)    Scheduled Medications:    . amLODipine  10 mg Oral Daily  . aspirin EC  325 mg Oral Daily  . cefTRIAXone (ROCEPHIN)  IV  1 g Intravenous Once  .  diazepam  5 mg Oral On Call  . fentaNYL      . folic acid  1 mg Oral Daily  . gemfibrozil  600 mg Oral BID AC  . going for heart surgery book   Does not apply Once  . heart attack bouncing book   Does not apply Once  . heparin      . heparin      . lidocaine      . metoprolol tartrate  12.5 mg Oral BID  . midazolam      . nitroGLYCERIN  1 inch Topical Q6H  . nitroGLYCERIN      . potassium chloride  10 mEq Oral Daily  . simvastatin  20 mg Oral QHS  . sodium chloride  3 mL Intravenous Q12H  . verapamil      . DISCONTD: sodium chloride  3 mL Intravenous Q12H    Assessment/ Plan:    1. CAD: has severe CAD. Agree with plans for CABG.  I have discussed the possibility of renal failure and the need for temporary ( and possibly long term) need for  dialysis. He understands.   2. Chronic renal insufficiency:  Cr. Is actually better.  Continue to watch.  3. Hyperlipidemia:  Continue simvastatin   Disposition:  Length of Stay: 2  Ramond Dial., MD, [facility] 10/17/2011, 7:49 AM Office [phone] Pager [phone]

## 2011-10-17 NOTE — Progress Notes (Signed)
VASCULAR LAB PRELIMINARY  PRELIMINARY  PRELIMINARY  PRELIMINARY  Pre-op Cardiac Surgery  Carotid Findings:  No evidence of significant ICA stenosis and vertebral artery flow is antegrade bilaterally.  Upper Extremity Right Left  Brachial Pressures T T  Radial Waveforms T T  Ulnar Waveforms T T  Palmar Arch (Allen's Test) WNL WNL   Findings:  Doppler waveforms remain normal with ulnar and radial compressions bilaterally.    Lower  Extremity Right Left  Dorsalis Pedis    Anterior Tibial    Posterior Tibial    Ankle/Brachial Indices      Findings:  Palpable pedal pulses x 4.   Calvina Liptak, 10/17/2011, 10:43 AM

## 2011-10-17 NOTE — Progress Notes (Signed)
TR BAND REMOVAL  LOCATION:    right radial  DEFLATED PER PROTOCOL:    yes  TIME BAND OFF / DRESSING APPLIED:    1200   SITE UPON ARRIVAL:    Level 0  SITE AFTER BAND REMOVAL:    Level {NUMBERS; 0-  REVERSE ALLEN'S TEST:     positive  CIRCULATION SENSATION AND MOVEMENT:    Within Normal Limits   yes  COMMENTS:   Tolerated procedure well

## 2011-10-17 NOTE — Progress Notes (Signed)
Received order on admit and now pt needs CABG. Will not follow pre-CABG unless reordered. Thx. Yves Dill CES, ACSM

## 2011-10-17 NOTE — Progress Notes (Signed)
ANTICOAGULATION CONSULT NOTE - Follow Up Consult  Pharmacy Consult for Heparin Indication: Severe 3V CAD while awaiting CABG planned for Friday, 10/11   Allergies  Allergen Reactions  . Amoxicillin   . Aspirin     REACTION: upset stomach  . Atarax (Hydroxyzine Hcl)   . Ciprofloxacin   . Codeine     REACTION: Stomach upset  . Hydrocodone   . Macrobid (Nitrofurantoin Macrocrystal)   . Niacin   . Niacin-Lovastatin Er   . Nitrofurantoin   . Trimox (Amoxicillin Trihydrate)     Patient Measurements: Height: 6' (182.9 cm) Weight: 181 lb 7 oz (82.3 kg) IBW/kg (Calculated) : 77.6  Heparin Dosing Weight: 82.3 kg  Vital Signs: Temp: 98.7 F (37.1 C) (10/09 0850) Temp src: Oral (10/09 0850) BP: 145/68 mmHg (10/09 0850) Pulse Rate: 64  (10/09 0850)  Labs:  Basename 10/17/11 1300 10/17/11 0118 10/17/11 0113 10/16/11 0645 10/16/11 0001 10/15/11 2012 10/15/11 1438 10/15/11 1234  HGB -- 11.9* -- 12.1* -- -- -- --  HCT -- 35.4* -- 37.1* -- -- -- 40.4  PLT -- 181 -- 215 -- -- -- 234  APTT -- -- -- -- -- -- 36 --  LABPROT -- -- -- -- -- -- 14.3 --  INR -- -- -- -- -- -- 1.13 --  HEPARINUNFRC 0.16* -- 0.37 0.47 -- -- -- --  CREATININE -- 1.89* -- 1.84* -- -- -- 2.02*  CKTOTAL -- -- -- -- -- -- -- --  CKMB -- -- -- -- -- -- -- --  TROPONINI -- -- -- 4.71* 5.03* 6.73* -- --    Estimated Creatinine Clearance: 32.5 ml/min (by C-G formula based on Cr of 1.89).   Medications:  Infusions:    . sodium chloride 1 mL/kg/hr (10/16/11 1000)  . heparin 1,000 Units/hr (10/16/11 1730)    Assessment: 78 y.o. M with 3VCAD found on cardiac cath on 10/8 who continues on heparin for anticoagulation while awaiting CABG planned for Friday, 10/11. Heparin level this morning was therapeutic at 0.37 however repeat level for confirmation was SUBtherapeutic (HL 0.16, goal of 0.3-0.7). Hgb/Hct/Plt slight drop, no s/sx of bleeding noted.   The patient was taken to the vascular lab this morning for  evaluation of his carotid arteries. It was verified with the lab that heparin was not turned off during this procedure and has been infusing appropriately. Given this information, will  increase heparin drip rate and recheck 8 hr level.  Goal of Therapy:  Heparin level 0.3-0.7 units/ml Monitor platelets by anticoagulation protocol: Yes   Plan:  1. Increase heparin drip rate to 1250 units/hr (12.5 ml/hr) 2. Will continue to monitor for any signs/symptoms of bleeding and will follow up with heparin level in 8 hours   Alycia Rossetti, PharmD, BCPS Clinical Pharmacist Pager: [phone] 10/17/2011 2:40 PM

## 2011-10-17 NOTE — Plan of Care (Signed)
Problem: Consults Goal: Cardiac Surgery Patient Education ( See Patient Education module for education specifics.) Outcome: Completed/Met Date Met:  10/17/11 Pt and wife watched Pre-heart surgery education video.  Given "going for heart surgery" book.  Denies questions, eager to learn, calm & cooperative.

## 2011-10-17 NOTE — Progress Notes (Signed)
[place] for Heparin Indication: Severe 3V CAD while awaiting further plans  Allergies  Allergen Reactions  . Amoxicillin   . Aspirin     REACTION: upset stomach  . Atarax (Hydroxyzine Hcl)   . Ciprofloxacin   . Codeine     REACTION: Stomach upset  . Hydrocodone   . Macrobid (Nitrofurantoin Macrocrystal)   . Niacin   . Niacin-Lovastatin Er   . Nitrofurantoin   . Trimox (Amoxicillin Trihydrate)     Patient Measurements: Height: 6' (182.9 cm) Weight: 181 lb 7 oz (82.3 kg) IBW/kg (Calculated) : 77.6  Heparin Dosing Weight: 80.3 kg  Vital Signs: Temp: 98.7 F (37.1 C) (10/09 0035) Temp src: Oral (10/09 0035) BP: 137/67 mmHg (10/09 0035) Pulse Rate: 66  (10/09 0035)  Labs:  Basename 10/17/11 0118 10/17/11 0113 10/16/11 0645 10/16/11 0001 10/15/11 2012 10/15/11 1438 10/15/11 1234  HGB 11.9* -- 12.1* -- -- -- --  HCT 35.4* -- 37.1* -- -- -- 40.4  PLT 181 -- 215 -- -- -- 234  APTT -- -- -- -- -- 36 --  LABPROT -- -- -- -- -- 14.3 --  INR -- -- -- -- -- 1.13 --  HEPARINUNFRC -- 0.37 0.47 0.42 -- -- --  CREATININE -- -- 1.84* -- -- -- 2.02*  CKTOTAL -- -- -- -- -- -- --  CKMB -- -- -- -- -- -- --  TROPONINI -- -- 4.71* 5.03* 6.73* -- --    Estimated Creatinine Clearance: 33.4 ml/min (by C-G formula based on Cr of 1.84).   Assessment: 78 y.o. Male with CAD s/p cath, awaiting CABG, for Heparin  Goal of Therapy:  Heparin level 0.3-0.7 units/ml Monitor platelets by anticoagulation protocol: Yes   Plan:  Continue Heparin at current rate  Phillis Knack, PharmD, BCPS  10/17/2011 1:51 AM

## 2011-10-18 ENCOUNTER — Inpatient Hospital Stay (HOSPITAL_COMMUNITY): Payer: Medicare Other

## 2011-10-18 DIAGNOSIS — I2510 Atherosclerotic heart disease of native coronary artery without angina pectoris: Secondary | ICD-10-CM

## 2011-10-18 LAB — URINALYSIS, ROUTINE W REFLEX MICROSCOPIC
Bilirubin Urine: NEGATIVE
Ketones, ur: NEGATIVE mg/dL
Nitrite: POSITIVE — AB
Nitrite: POSITIVE — AB
Protein, ur: NEGATIVE mg/dL
Specific Gravity, Urine: 1.015 (ref 1.005–1.030)
pH: 5.5 (ref 5.0–8.0)
pH: 6 (ref 5.0–8.0)

## 2011-10-18 LAB — TYPE AND SCREEN
ABO/RH(D): O POS
Antibody Screen: NEGATIVE

## 2011-10-18 LAB — URINE MICROSCOPIC-ADD ON

## 2011-10-18 LAB — URINE CULTURE

## 2011-10-18 LAB — CBC
Hemoglobin: 11.6 g/dL — ABNORMAL LOW (ref 13.0–17.0)
RBC: 4.51 MIL/uL (ref 4.22–5.81)

## 2011-10-18 LAB — BLOOD GAS, ARTERIAL
Drawn by: 277331
FIO2: 0.21 %
Patient temperature: 98.6
TCO2: 20.2 mmol/L (ref 0–100)
pCO2 arterial: 32.5 mmHg — ABNORMAL LOW (ref 35.0–45.0)
pH, Arterial: 7.39 (ref 7.350–7.450)

## 2011-10-18 LAB — BASIC METABOLIC PANEL
CO2: 20 mEq/L (ref 19–32)
Chloride: 107 mEq/L (ref 96–112)
Glucose, Bld: 98 mg/dL (ref 70–99)
Potassium: 4 mEq/L (ref 3.5–5.1)
Sodium: 138 mEq/L (ref 135–145)

## 2011-10-18 MED ORDER — PHENYLEPHRINE HCL 10 MG/ML IJ SOLN
30.0000 ug/min | INTRAVENOUS | Status: DC
  Filled 2011-10-18: qty 2

## 2011-10-18 MED ORDER — TEMAZEPAM 15 MG PO CAPS: 15.0000 mg | ORAL_CAPSULE | Freq: Once | ORAL | Status: AC | PRN

## 2011-10-18 MED ORDER — TEMAZEPAM 15 MG PO CAPS: 15.0000 mg | ORAL_CAPSULE | Freq: Once | ORAL | Status: DC | PRN

## 2011-10-18 MED ORDER — DEXTROSE 5 % IV SOLN
1.0000 g | Freq: Once | INTRAVENOUS | Status: AC
  Administered 2011-10-19: 1 g via INTRAVENOUS
  Filled 2011-10-18: qty 1

## 2011-10-18 MED ORDER — DIAZEPAM 5 MG PO TABS
5.0000 mg | ORAL_TABLET | Freq: Once | ORAL | Status: AC
  Administered 2011-10-19: 5 mg via ORAL
  Filled 2011-10-18: qty 1

## 2011-10-18 MED ORDER — NITROGLYCERIN IN D5W 200-5 MCG/ML-% IV SOLN
2.0000 ug/min | INTRAVENOUS | Status: DC
  Filled 2011-10-18: qty 250

## 2011-10-18 MED ORDER — DIAZEPAM 5 MG PO TABS: 5.0000 mg | ORAL_TABLET | Freq: Once | ORAL | Status: DC

## 2011-10-18 MED ORDER — BISACODYL 5 MG PO TBEC
5.0000 mg | DELAYED_RELEASE_TABLET | Freq: Once | ORAL | Status: AC
  Administered 2011-10-18: 5 mg via ORAL
  Filled 2011-10-18 (×2): qty 1

## 2011-10-18 MED ORDER — MAGNESIUM SULFATE 50 % IJ SOLN
40.0000 meq | INTRAMUSCULAR | Status: DC
  Filled 2011-10-18: qty 10

## 2011-10-18 MED ORDER — DOPAMINE-DEXTROSE 3.2-5 MG/ML-% IV SOLN
2.0000 ug/kg/min | INTRAVENOUS | Status: DC
  Filled 2011-10-18: qty 250

## 2011-10-18 MED ORDER — SODIUM CHLORIDE 0.9 % IV SOLN
INTRAVENOUS | Status: DC
  Filled 2011-10-18: qty 40

## 2011-10-18 MED ORDER — METOPROLOL TARTRATE 12.5 MG HALF TABLET
12.5000 mg | ORAL_TABLET | Freq: Once | ORAL | Status: AC
  Administered 2011-10-19: 12.5 mg via ORAL
  Filled 2011-10-18: qty 1

## 2011-10-18 MED ORDER — DEXMEDETOMIDINE HCL IN NACL 400 MCG/100ML IV SOLN
0.1000 ug/kg/h | INTRAVENOUS | Status: DC
  Filled 2011-10-18: qty 100

## 2011-10-18 MED ORDER — SODIUM CHLORIDE 0.9 % IV SOLN
INTRAVENOUS | Status: DC
  Filled 2011-10-18: qty 1

## 2011-10-18 MED ORDER — POTASSIUM CHLORIDE 2 MEQ/ML IV SOLN
80.0000 meq | INTRAVENOUS | Status: DC
  Filled 2011-10-18: qty 40

## 2011-10-18 MED ORDER — VANCOMYCIN HCL 1000 MG IV SOLR
1250.0000 mg | INTRAVENOUS | Status: DC
  Filled 2011-10-18 (×2): qty 1250

## 2011-10-18 MED ORDER — CHLORHEXIDINE GLUCONATE 4 % EX LIQD
60.0000 mL | Freq: Once | CUTANEOUS | Status: AC
  Administered 2011-10-19: 4 via TOPICAL
  Filled 2011-10-18: qty 60

## 2011-10-18 MED ORDER — DEXTROSE 5 % IV SOLN
0.5000 ug/min | INTRAVENOUS | Status: DC
Start: 2011-10-19 — End: 2011-10-18
  Filled 2011-10-18: qty 4

## 2011-10-18 MED ORDER — CHLORHEXIDINE GLUCONATE 4 % EX LIQD
60.0000 mL | Freq: Once | CUTANEOUS | Status: DC
  Filled 2011-10-18: qty 60

## 2011-10-18 MED ORDER — PLASMA-LYTE 148 IV SOLN
INTRAVENOUS | Status: AC
  Administered 2011-10-19: 10:00:00
  Filled 2011-10-18: qty 2.5

## 2011-10-18 MED ORDER — DEXTROSE 5 % IV SOLN
0.5000 ug/min | INTRAVENOUS | Status: DC
  Filled 2011-10-18: qty 4

## 2011-10-18 MED ORDER — SODIUM CHLORIDE 0.9 % IV SOLN
INTRAVENOUS | Status: AC
  Administered 2011-10-19: .8 [IU]/h via INTRAVENOUS
  Filled 2011-10-18: qty 1

## 2011-10-18 MED ORDER — VANCOMYCIN HCL 1000 MG IV SOLR
1250.0000 mg | INTRAVENOUS | Status: AC
  Administered 2011-10-19: 1250 mg via INTRAVENOUS
  Filled 2011-10-18: qty 1250

## 2011-10-18 MED ORDER — DEXMEDETOMIDINE HCL IN NACL 400 MCG/100ML IV SOLN
0.1000 ug/kg/h | INTRAVENOUS | Status: AC
  Administered 2011-10-19: .2 ug/kg/h via INTRAVENOUS
  Filled 2011-10-18: qty 100

## 2011-10-18 MED ORDER — PHENYLEPHRINE HCL 10 MG/ML IJ SOLN
30.0000 ug/min | INTRAMUSCULAR | Status: DC
  Filled 2011-10-18: qty 2

## 2011-10-18 MED ORDER — PLASMA-LYTE 148 IV SOLN
INTRAVENOUS | Status: DC
  Filled 2011-10-18: qty 2.5

## 2011-10-18 MED ORDER — MOXIFLOXACIN HCL IN NACL 400 MG/250ML IV SOLN
400.0000 mg | INTRAVENOUS | Status: DC
  Filled 2011-10-18: qty 250

## 2011-10-18 MED ORDER — SODIUM CHLORIDE 0.9 % IV SOLN
INTRAVENOUS | Status: AC
  Administered 2011-10-19: 14 mL/h via INTRAVENOUS
  Filled 2011-10-18: qty 40

## 2011-10-18 MED ORDER — METOPROLOL TARTRATE 12.5 MG HALF TABLET
12.5000 mg | ORAL_TABLET | Freq: Once | ORAL | Status: DC
  Filled 2011-10-18: qty 1

## 2011-10-18 MED ORDER — MOXIFLOXACIN HCL IN NACL 400 MG/250ML IV SOLN
400.0000 mg | INTRAVENOUS | Status: DC
  Filled 2011-10-18 (×2): qty 250

## 2011-10-18 MED ORDER — POLYVINYL ALCOHOL 1.4 % OP SOLN
1.0000 [drp] | OPHTHALMIC | Status: AC | PRN
  Administered 2011-10-18: 1 [drp] via OPHTHALMIC
  Filled 2011-10-18: qty 15

## 2011-10-18 MED ORDER — NITROGLYCERIN IN D5W 200-5 MCG/ML-% IV SOLN
2.0000 ug/min | INTRAVENOUS | Status: AC
  Administered 2011-10-19: 5 ug/min via INTRAVENOUS

## 2011-10-18 NOTE — Progress Notes (Addendum)
Cardiology Progress Note Patient Name: [name] Date of Encounter: 10/18/2011, 10:29 AM     Subjective  Developed sob last night relieved with Morphine. No chest pain. Breathing much better this morning.   Objective   Telemetry: Sinus rhythm 60-70s  Medications: . aminocaproic acid (AMICAR) for OHS   Intravenous To OR  . amLODipine  10 mg Oral Daily  . aspirin EC  325 mg Oral Daily  . aztreonam  1 g Intravenous Once  . bisacodyl  5 mg Oral Once  . cefTRIAXone (ROCEPHIN)  IV  1 g Intravenous Once  . chlorhexidine  60 mL Topical Once  . dexmedetomidine  0.1-0.7 mcg/kg/hr Intravenous To OR  . diazepam  5 mg Oral Once  . DOPamine  2-20 mcg/kg/min Intravenous To OR  . epinephrine  0.5-20 mcg/min Intravenous To OR  . folic acid  1 mg Oral Daily  . gemfibrozil  600 mg Oral BID AC  . heparin-papaverine-plasmalyte irrigation   Irrigation To OR  . insulin (NOVOLIN-R) infusion   Intravenous To OR  . magnesium sulfate  40 mEq Other To OR  . metoprolol tartrate  12.5 mg Oral BID  . metoprolol tartrate  12.5 mg Oral Once  . moxifloxacin  400 mg Intravenous To OR  . nitroGLYCERIN  1 inch Topical Q6H  . nitroGLYCERIN  2-200 mcg/min Intravenous To OR  . phenylephrine (NEO-SYNEPHRINE) Adult infusion  30-200 mcg/min Intravenous To OR  . potassium chloride  10 mEq Oral Daily  . potassium chloride  80 mEq Other To OR  . rosuvastatin  5 mg Oral q1800  . sodium chloride  3 mL Intravenous Q12H  . vancomycin  1,250 mg Intravenous To OR  . DISCONTD: rosuvastatin  10 mg Oral q1800  . DISCONTD: simvastatin  20 mg Oral QHS   . heparin 1,250 Units/hr (10/17/11 1716)    Physical Exam: Temp:  [97.7 F (36.5 C)-98.6 F (37 C)] 97.7 F (36.5 C) (10/10 0829) Pulse Rate:  [61-71] 61  (10/10 0829) Resp:  [14-26] 18  (10/10 0829) BP: (105-118)/(53-74) 109/61 mmHg (10/10 0829) SpO2:  [92 %-100 %] 99 % (10/10 0829) Weight:  [184 lb 1.4 oz (83.5 kg)] 184 lb 1.4 oz (83.5 kg) (10/10  0056)  General: Pleasant elderly white male, in no acute distress. Head: Normocephalic, atraumatic, sclera non-icteric, nares are without discharge.  Neck: Supple. Negative for carotid bruits or JVD Lungs: Clear bilaterally to auscultation without wheezes, rales, or rhonchi. Breathing is unlabored. Heart: RRR S1 S2 without murmurs, rubs, or gallops.  Abdomen: Soft, non-tender, non-distended with normoactive bowel sounds. No rebound/guarding. No obvious abdominal masses. Msk:  Strength and tone appear normal for age. Extremities: No edema. No clubbing or cyanosis. Distal pedal pulses are intact and equal bilaterally. Neuro: Alert and oriented X 3. Moves all extremities spontaneously. Psych:  Responds to questions appropriately with a normal affect.   Intake/Output Summary (Last 24 hours) at 10/18/11 1029 Last data filed at 10/18/11 0500  Gross per 24 hour  Intake 915.42 ml  Output    700 ml  Net 215.42 ml    Labs:  [facility] 10/18/11 0540 10/17/11 0118  NA 138 139  K 4.0 3.9  CL 107 109  CO2 20 19  GLUCOSE 98 99  BUN 24* 26*  CREATININE 1.86* 1.89*  CALCIUM 9.0 8.9   Basename 10/18/11 0540 10/17/11 0118 10/15/11 1234  WBC 5.0 5.7 --  NEUTROABS -- -- 4.7  HGB 11.6* 11.9* --  HCT 35.5* 35.4* --  MCV 78.7 78.3 --  PLT 197 181 --   Basename 10/16/11 0645 10/16/11 0001 10/15/11 2012 10/15/11 1613  CKTOTAL -- -- -- --  CKMB -- -- -- --  TROPONINI 4.71* 5.03* 6.73* 5.11*   Basename 10/15/11 2012  TSH 1.823   Radiology/Studies:   10/15/2011 - PORTABLE CHEST - 1 VIEW  Findings: The heart and pulmonary vascularity are within normal limits.  The lungs are clear bilaterally.  No bony abnormality is seen.  IMPRESSION: No acute abnormality noted.      Assessment and Plan   1. NSTEMI/Coronary Artery Disease: Severe 3v CAD by cath 10/8. Evaluated by TCTS with plans for CABG 10/11. Cont IV heparin, ASA, BB, statin, NTG paste.  2. Chronic Renal Insufficiency: Stage III CKD. Crt  stable  3. Hyperlipidemia: Cont statin  4. Hypertension: BP stable. Cont antihypertensives   5. Anemia: Hgb 11.6. Stable. No signs of active bleeding.  Signed, HOPE, JESSICA PA-C  Renal function appears stable.  No chest pain.  Stable at present.  I have personally seen the patient and examined the patient and concur with the note of Ms. Hope  Some sob last pm but resolved. CABG planned in am.  Patient follows with Dr. Dannielle Burn in [place] regularly event though he lives in [place].  Will make arrangements at time of dc for follow up with him.  Wrist looks stable.  Cr improved.

## 2011-10-18 NOTE — Progress Notes (Signed)
ANTICOAGULATION CONSULT NOTE - Follow Up Consult  Pharmacy Consult for Heparin Indication: severe 3V CAD, for CABG 10/11  Allergies  Allergen Reactions  . Amoxicillin Nausea Only  . Aspirin     REACTION: upset stomach; tolerates coated aspirin  . Atarax (Hydroxyzine Hcl)   . Ciprofloxacin Nausea Only  . Codeine     REACTION: Stomach upset  . Hydrocodone Nausea Only  . Macrobid (Nitrofurantoin Macrocrystal) Nausea Only  . Niacin   . Niacin-Lovastatin Er     Unsure of reaction. Taking simvastatin at home without problems  . Nitrofurantoin   . Trimox (Amoxicillin Trihydrate)     Patient Measurements: Height: 6' (182.9 cm) Weight: 184 lb 1.4 oz (83.5 kg) IBW/kg (Calculated) : 77.6   Vital Signs: Temp: 97.7 F (36.5 C) (10/10 0829) Temp src: Oral (10/10 0829) BP: 109/61 mmHg (10/10 0829) Pulse Rate: 61  (10/10 0829)  Labs:  Basename 10/18/11 0540 10/17/11 2317 10/17/11 1300 10/17/11 0118 10/16/11 0645 10/16/11 0001 10/15/11 2012 10/15/11 1438  HGB 11.6* -- -- 11.9* -- -- -- --  HCT 35.5* -- -- 35.4* 37.1* -- -- --  PLT 197 -- -- 181 215 -- -- --  APTT -- -- -- -- -- -- -- 36  LABPROT -- -- -- -- -- -- -- 14.3  INR -- -- -- -- -- -- -- 1.13  HEPARINUNFRC 0.37 0.35 0.16* -- -- -- -- --  CREATININE 1.86* -- -- 1.89* 1.84* -- -- --  CKTOTAL -- -- -- -- -- -- -- --  CKMB -- -- -- -- -- -- -- --  TROPONINI -- -- -- -- 4.71* 5.03* 6.73* --    Estimated Creatinine Clearance: 33 ml/min (by C-G formula based on Cr of 1.86).   Assessment:   Heparin level is therapeutic on 1250 units/hr.   For CABG on 10/11, drip to be stopped on call to [place] with patient about multiple med allergies listed.   Most meds have causes GI uspet, including Amoxicillin and Cipro. He related "red skin", sensitive to touch from one med in the past (Niacin?). Tolerates coated aspirin.  GI upset from Codeine and Hydrocodone. Has not used Oxycodone in the past.  Goal of Therapy:  Heparin level  0.3-0.7 units/ml Monitor platelets by anticoagulation protocol: Yes   Plan:   Continue heparin at 1250 units/hr.  Heparin drip to stop in am pre-op.  Arty Baumgartner, [place] Pager: [phone] 10/18/2011,10:39 AM

## 2011-10-18 NOTE — Progress Notes (Signed)
[place] for Heparin Indication: Severe 3V CAD ,awaiting CABG  Allergies  Allergen Reactions  . Amoxicillin   . Aspirin     REACTION: upset stomach  . Atarax (Hydroxyzine Hcl)   . Ciprofloxacin   . Codeine     REACTION: Stomach upset  . Hydrocodone   . Macrobid (Nitrofurantoin Macrocrystal)   . Niacin   . Niacin-Lovastatin Er   . Nitrofurantoin   . Trimox (Amoxicillin Trihydrate)     Patient Measurements: Height: 6' (182.9 cm) Weight: 181 lb 7 oz (82.3 kg) IBW/kg (Calculated) : 77.6  Heparin Dosing Weight: 80.3 kg  Vital Signs: Temp: 98.6 F (37 C) (10/09 2018) Temp src: Oral (10/09 2018) BP: 111/53 mmHg (10/09 2018) Pulse Rate: 71  (10/09 2018)  Labs:  Basename 10/17/11 2317 10/17/11 1300 10/17/11 0118 10/17/11 0113 10/16/11 0645 10/16/11 0001 10/15/11 2012 10/15/11 1438 10/15/11 1234  HGB -- -- 11.9* -- 12.1* -- -- -- --  HCT -- -- 35.4* -- 37.1* -- -- -- 40.4  PLT -- -- 181 -- 215 -- -- -- 234  APTT -- -- -- -- -- -- -- 36 --  LABPROT -- -- -- -- -- -- -- 14.3 --  INR -- -- -- -- -- -- -- 1.13 --  HEPARINUNFRC 0.35 0.16* -- 0.37 -- -- -- -- --  CREATININE -- -- 1.89* -- 1.84* -- -- -- 2.02*  CKTOTAL -- -- -- -- -- -- -- -- --  CKMB -- -- -- -- -- -- -- -- --  TROPONINI -- -- -- -- 4.71* 5.03* 6.73* -- --    Estimated Creatinine Clearance: 32.5 ml/min (by C-G formula based on Cr of 1.89).   Assessment: 77 y.o. Male with CAD s/p cath, awaiting CABG, for Heparin  Goal of Therapy:  Heparin level 0.3-0.7 units/ml Monitor platelets by anticoagulation protocol: Yes   Plan:  Continue Heparin at current rate Follow-up am labs.   Phillis Knack, PharmD, BCPS  10/18/2011 12:29 AM

## 2011-10-18 NOTE — Progress Notes (Signed)
2 Days Post-Op Procedure(s) (LRB): LEFT HEART CATHETERIZATION WITH CORONARY ANGIOGRAM (N/A) Subjective: Patient had some shortness of breath overnight after falling asleep. ECG unchanged. Sats were fine. He feels better this am but looks tired.  Objective: Vital signs in last 24 hours: Temp:  [97.7 F (36.5 C)-98.6 F (37 C)] 97.7 F (36.5 C) (10/10 0829) Pulse Rate:  [61-71] 61  (10/10 0829) Cardiac Rhythm:  [-] Normal sinus rhythm (10/10 0800) Resp:  [14-26] 18  (10/10 0829) BP: (105-118)/(53-74) 109/61 mmHg (10/10 0829) SpO2:  [92 %-100 %] 99 % (10/10 0829) Weight:  [83.5 kg (184 lb 1.4 oz)] 83.5 kg (184 lb 1.4 oz) (10/10 0056)  Hemodynamic parameters for last 24 hours:    Intake/Output from previous day: 10/09 0701 - 10/10 0700 In: 915.4 [P.O.:440; I.V.:475.4] Out: 1200 [Urine:1200] Intake/Output this shift:    General appearance: alert and cooperative Heart: regular rate and rhythm, S1, S2 normal, no murmur, click, rub or gallop Lungs: clear to auscultation bilaterally  Lab Results:  Basename 10/18/11 0540 10/17/11 0118  WBC 5.0 5.7  HGB 11.6* 11.9*  HCT 35.5* 35.4*  PLT 197 181   BMET:  Basename 10/18/11 0540 10/17/11 0118  NA 138 139  K 4.0 3.9  CL 107 109  CO2 20 19  GLUCOSE 98 99  BUN 24* 26*  CREATININE 1.86* 1.89*  CALCIUM 9.0 8.9    PT/INR:  Basename 10/15/11 1438  LABPROT 14.3  INR 1.13   ABG No results found for this basename: phart, pco2, po2, hco3, tco2, acidbasedef, o2sat   CBG (last 3)  No results found for this basename: GLUCAP:3 in the last 72 hours  Assessment/Plan: S/P Procedure(s) (LRB): LEFT HEART CATHETERIZATION WITH CORONARY ANGIOGRAM (N/A) Severe 3-vessel CAD.  Stage III CKD:  Creatinine is stable Plan CABG in am. I discussed the operative procedure with the patient and family including alternatives, benefits and risks; including but not limited to bleeding, blood transfusion, infection, stroke, myocardial infarction,  graft failure, heart block requiring a permanent pacemaker, organ dysfunction, and death.  Yvonne Kendall Handa understands and agrees to proceed.    LOS: 3 days    BARTLE,BRYAN K 10/18/2011

## 2011-10-18 NOTE — Progress Notes (Signed)
Pt awake again with c/o "feel like I fall asleep and have trouble breathing and wake up."  O2 up to 4L, O2 sat 100%. BP 113/67.  EKG done, no change from previous.  HOB up >60 degrees.  Facial grimace but denies pain.  Medicated w/ Morphine IV.  Dozed off, awakened easily, states feels better, denies sob or pain.

## 2011-10-18 NOTE — Progress Notes (Signed)
Pt reported mild chest heaviness with feeling of SOB.  92% on ra.  BP 112/57.  1" NTG paste to chest, placed on 2L per [place].  Upon 30 minute recheck, pt snoring quietly.  Will continue to monitor closely.

## 2011-10-19 ENCOUNTER — Encounter (HOSPITAL_COMMUNITY): Payer: Self-pay | Admitting: Certified Registered Nurse Anesthetist

## 2011-10-19 ENCOUNTER — Encounter (HOSPITAL_COMMUNITY)
Admission: EM | Disposition: A | Discharge status: Home / Self Care | Payer: Self-pay | Source: Home / Self Care | Attending: Surgery

## 2011-10-19 ENCOUNTER — Inpatient Hospital Stay (HOSPITAL_COMMUNITY): Payer: Medicare Other

## 2011-10-19 ENCOUNTER — Inpatient Hospital Stay (HOSPITAL_COMMUNITY): Payer: Medicare Other | Admitting: Certified Registered Nurse Anesthetist

## 2011-10-19 DIAGNOSIS — I2510 Atherosclerotic heart disease of native coronary artery without angina pectoris: Secondary | ICD-10-CM

## 2011-10-19 HISTORY — PX: CORONARY ARTERY BYPASS GRAFT: SHX141

## 2011-10-19 LAB — CBC
HCT: 38.1 % — ABNORMAL LOW (ref 39.0–52.0)
HCT: 34.1 % — ABNORMAL LOW (ref 39.0–52.0)
Hemoglobin: 12.5 g/dL — ABNORMAL LOW (ref 13.0–17.0)
Hemoglobin: 11.1 g/dL — ABNORMAL LOW (ref 13.0–17.0)
MCH: 26 pg (ref 26.0–34.0)
MCH: 25.4 pg — ABNORMAL LOW (ref 26.0–34.0)
MCH: 25.6 pg — ABNORMAL LOW (ref 26.0–34.0)
MCHC: 32.8 g/dL (ref 30.0–36.0)
MCHC: 32.6 g/dL (ref 30.0–36.0)
MCV: 79.2 fL (ref 78.0–100.0)
MCV: 78.3 fL (ref 78.0–100.0)
Platelets: 211 10*3/uL (ref 150–400)
Platelets: 130 10*3/uL — ABNORMAL LOW (ref 150–400)
RBC: 4.81 MIL/uL (ref 4.22–5.81)
RBC: 3.97 MIL/uL — ABNORMAL LOW (ref 4.22–5.81)
RDW: 14.6 % (ref 11.5–15.5)
RDW: 14.5 % (ref 11.5–15.5)
RDW: 14.6 % (ref 11.5–15.5)
WBC: 4.9 10*3/uL (ref 4.0–10.5)

## 2011-10-19 LAB — POCT I-STAT 3, ART BLOOD GAS (G3+)
Acid-base deficit: 3 mmol/L — ABNORMAL HIGH (ref 0.0–2.0)
Acid-base deficit: 6 mmol/L — ABNORMAL HIGH (ref 0.0–2.0)
Bicarbonate: 21.9 mEq/L (ref 20.0–24.0)
Bicarbonate: 20.5 mEq/L (ref 20.0–24.0)
Bicarbonate: 20 mEq/L (ref 20.0–24.0)
Bicarbonate: 19.5 mEq/L — ABNORMAL LOW (ref 20.0–24.0)
Bicarbonate: 18 mEq/L — ABNORMAL LOW (ref 20.0–24.0)
O2 Saturation: 100 %
O2 Saturation: 93 %
O2 Saturation: 94 %
O2 Saturation: 94 %
Patient temperature: 36.2
Patient temperature: 37.2
TCO2: 22 mmol/L (ref 0–100)
TCO2: 22 mmol/L (ref 0–100)
TCO2: 21 mmol/L (ref 0–100)
pCO2 arterial: 34.3 mmHg — ABNORMAL LOW (ref 35.0–45.0)
pCO2 arterial: 41.8 mmHg (ref 35.0–45.0)
pCO2 arterial: 34.8 mmHg — ABNORMAL LOW (ref 35.0–45.0)
pH, Arterial: 7.338 — ABNORMAL LOW (ref 7.350–7.450)
pH, Arterial: 7.281 — ABNORMAL LOW (ref 7.350–7.450)
pO2, Arterial: 304 mmHg — ABNORMAL HIGH (ref 80.0–100.0)
pO2, Arterial: 77 mmHg — ABNORMAL LOW (ref 80.0–100.0)
pO2, Arterial: 72 mmHg — ABNORMAL LOW (ref 80.0–100.0)
pO2, Arterial: 79 mmHg — ABNORMAL LOW (ref 80.0–100.0)

## 2011-10-19 LAB — POCT I-STAT 4, (NA,K, GLUC, HGB,HCT)
Glucose, Bld: 85 mg/dL (ref 70–99)
Glucose, Bld: 89 mg/dL (ref 70–99)
Glucose, Bld: 103 mg/dL — ABNORMAL HIGH (ref 70–99)
HCT: 36 % — ABNORMAL LOW (ref 39.0–52.0)
HCT: 27 % — ABNORMAL LOW (ref 39.0–52.0)
HCT: 29 % — ABNORMAL LOW (ref 39.0–52.0)
HCT: 33 % — ABNORMAL LOW (ref 39.0–52.0)
Hemoglobin: 9.2 g/dL — ABNORMAL LOW (ref 13.0–17.0)
Hemoglobin: 9.9 g/dL — ABNORMAL LOW (ref 13.0–17.0)
Hemoglobin: 11.2 g/dL — ABNORMAL LOW (ref 13.0–17.0)
Potassium: 4.5 mEq/L (ref 3.5–5.1)
Potassium: 5 mEq/L (ref 3.5–5.1)
Sodium: 140 mEq/L (ref 135–145)
Sodium: 136 mEq/L (ref 135–145)
Sodium: 138 mEq/L (ref 135–145)
Sodium: 141 mEq/L (ref 135–145)

## 2011-10-19 LAB — BASIC METABOLIC PANEL
CO2: 21 mEq/L (ref 19–32)
Chloride: 108 mEq/L (ref 96–112)
Glucose, Bld: 92 mg/dL (ref 70–99)
Sodium: 139 mEq/L (ref 135–145)

## 2011-10-19 LAB — HEPARIN LEVEL (UNFRACTIONATED): Heparin Unfractionated: 0.4 IU/mL (ref 0.30–0.70)

## 2011-10-19 LAB — POCT I-STAT, CHEM 8
BUN: 22 mg/dL (ref 6–23)
Chloride: 111 mEq/L (ref 96–112)
Creatinine, Ser: 1.7 mg/dL — ABNORMAL HIGH (ref 0.50–1.35)
Hemoglobin: 11.6 g/dL — ABNORMAL LOW (ref 13.0–17.0)
Potassium: 4.8 mEq/L (ref 3.5–5.1)
Sodium: 140 mEq/L (ref 135–145)

## 2011-10-19 LAB — HEMOGLOBIN AND HEMATOCRIT, BLOOD: HCT: 27.8 % — ABNORMAL LOW (ref 39.0–52.0)

## 2011-10-19 LAB — GLUCOSE, CAPILLARY

## 2011-10-19 LAB — PLATELET COUNT: Platelets: 143 10*3/uL — ABNORMAL LOW (ref 150–400)

## 2011-10-19 SURGERY — CORONARY ARTERY BYPASS GRAFTING (CABG)
Anesthesia: General | Site: Chest | Wound class: Clean

## 2011-10-19 MED ORDER — BISACODYL 5 MG PO TBEC
10.0000 mg | DELAYED_RELEASE_TABLET | Freq: Every day | ORAL | Status: DC
  Administered 2011-10-20 – 2011-10-21 (×2): 10 mg via ORAL
  Filled 2011-10-19 (×2): qty 2

## 2011-10-19 MED ORDER — ACETAMINOPHEN 160 MG/5ML PO SOLN: 975.0000 mg | Freq: Four times a day (QID) | ORAL | Status: DC

## 2011-10-19 MED ORDER — VECURONIUM BROMIDE 10 MG IV SOLR
INTRAVENOUS | Status: DC | PRN
  Administered 2011-10-19: 5 mg via INTRAVENOUS

## 2011-10-19 MED ORDER — INSULIN ASPART 100 UNIT/ML ~~LOC~~ SOLN
0.0000 [IU] | SUBCUTANEOUS | Status: AC
  Administered 2011-10-19: 2 [IU] via SUBCUTANEOUS

## 2011-10-19 MED ORDER — CHLORHEXIDINE GLUCONATE CLOTH 2 % EX PADS
6.0000 | MEDICATED_PAD | Freq: Every day | CUTANEOUS | Status: DC
  Administered 2011-10-19 – 2011-10-22 (×4): 6 via TOPICAL

## 2011-10-19 MED ORDER — LACTATED RINGERS IV SOLN
INTRAVENOUS | Status: DC | PRN
  Administered 2011-10-19 (×3): via INTRAVENOUS

## 2011-10-19 MED ORDER — SODIUM CHLORIDE 0.9 % IR SOLN
Status: DC | PRN
  Administered 2011-10-19: 6000 mL

## 2011-10-19 MED ORDER — METOPROLOL TARTRATE 1 MG/ML IV SOLN
2.5000 mg | INTRAVENOUS | Status: DC | PRN
  Administered 2011-10-21: 5 mg via INTRAVENOUS
  Filled 2011-10-19: qty 5

## 2011-10-19 MED ORDER — MIDAZOLAM HCL 2 MG/2ML IJ SOLN: 2.0000 mg | INTRAMUSCULAR | Status: DC | PRN

## 2011-10-19 MED ORDER — SODIUM CHLORIDE 0.9 % IV SOLN: 250.0000 mL | INTRAVENOUS | Status: DC

## 2011-10-19 MED ORDER — ACETAMINOPHEN 650 MG RE SUPP
650.0000 mg | RECTAL | Status: AC
  Administered 2011-10-19: 650 mg via RECTAL

## 2011-10-19 MED ORDER — PANTOPRAZOLE SODIUM 40 MG PO TBEC
40.0000 mg | DELAYED_RELEASE_TABLET | Freq: Every day | ORAL | Status: DC
  Administered 2011-10-21 – 2011-10-24 (×4): 40 mg via ORAL
  Filled 2011-10-19 (×4): qty 1

## 2011-10-19 MED ORDER — SODIUM CHLORIDE 0.9 % IV SOLN
INTRAVENOUS | Status: DC | PRN
  Administered 2011-10-19: 08:00:00 via INTRAVENOUS

## 2011-10-19 MED ORDER — SODIUM CHLORIDE 0.9 % IJ SOLN
3.0000 mL | Freq: Two times a day (BID) | INTRAMUSCULAR | Status: DC
  Administered 2011-10-20 – 2011-10-22 (×5): 3 mL via INTRAVENOUS

## 2011-10-19 MED ORDER — PHENYLEPHRINE HCL 10 MG/ML IJ SOLN
0.0000 ug/min | INTRAVENOUS | Status: DC
  Filled 2011-10-19: qty 2

## 2011-10-19 MED ORDER — INSULIN REGULAR BOLUS VIA INFUSION
0.0000 [IU] | Freq: Three times a day (TID) | INTRAVENOUS | Status: DC
  Filled 2011-10-19: qty 10

## 2011-10-19 MED ORDER — THROMBIN 20000 UNITS EX SOLR
OROMUCOSAL | Status: DC | PRN
  Administered 2011-10-19 (×3): via TOPICAL

## 2011-10-19 MED ORDER — VANCOMYCIN HCL IN DEXTROSE 1-5 GM/200ML-% IV SOLN
1000.0000 mg | Freq: Once | INTRAVENOUS | Status: AC
  Administered 2011-10-19: 1000 mg via INTRAVENOUS
  Filled 2011-10-19: qty 200

## 2011-10-19 MED ORDER — MIDAZOLAM HCL 5 MG/5ML IJ SOLN
INTRAMUSCULAR | Status: DC | PRN
  Administered 2011-10-19: 4 mg via INTRAVENOUS
  Administered 2011-10-19 (×3): 2 mg via INTRAVENOUS

## 2011-10-19 MED ORDER — MUPIROCIN 2 % EX OINT
1.0000 "application " | TOPICAL_OINTMENT | Freq: Two times a day (BID) | CUTANEOUS | Status: DC
  Administered 2011-10-19 – 2011-10-22 (×7): 1 via NASAL
  Filled 2011-10-19: qty 22

## 2011-10-19 MED ORDER — DOCUSATE SODIUM 100 MG PO CAPS
200.0000 mg | ORAL_CAPSULE | Freq: Every day | ORAL | Status: DC
  Administered 2011-10-20 – 2011-10-22 (×3): 200 mg via ORAL
  Filled 2011-10-19 (×3): qty 2

## 2011-10-19 MED ORDER — ONDANSETRON HCL 4 MG/2ML IJ SOLN
4.0000 mg | Freq: Four times a day (QID) | INTRAMUSCULAR | Status: DC | PRN
  Administered 2011-10-22: 4 mg via INTRAVENOUS
  Filled 2011-10-19: qty 2

## 2011-10-19 MED ORDER — ARTIFICIAL TEARS OP OINT
TOPICAL_OINTMENT | OPHTHALMIC | Status: DC | PRN
  Administered 2011-10-19: 1 via OPHTHALMIC

## 2011-10-19 MED ORDER — MORPHINE SULFATE 2 MG/ML IJ SOLN
2.0000 mg | INTRAMUSCULAR | Status: DC | PRN
  Administered 2011-10-19 – 2011-10-20 (×2): 2 mg via INTRAVENOUS
  Filled 2011-10-19 (×4): qty 1

## 2011-10-19 MED ORDER — ALBUMIN HUMAN 5 % IV SOLN
INTRAVENOUS | Status: DC | PRN
  Administered 2011-10-19: 12:00:00 via INTRAVENOUS

## 2011-10-19 MED ORDER — THROMBIN 20000 UNITS EX SOLR
CUTANEOUS | Status: AC
  Filled 2011-10-19: qty 20000

## 2011-10-19 MED ORDER — LACTATED RINGERS IV SOLN
INTRAVENOUS | Status: DC
  Administered 2011-10-19: 14:00:00 via INTRAVENOUS

## 2011-10-19 MED ORDER — ACETAMINOPHEN 500 MG PO TABS
1000.0000 mg | ORAL_TABLET | Freq: Four times a day (QID) | ORAL | Status: DC
  Administered 2011-10-19 – 2011-10-22 (×10): 1000 mg via ORAL
  Filled 2011-10-19 (×13): qty 2

## 2011-10-19 MED ORDER — POTASSIUM CHLORIDE 10 MEQ/50ML IV SOLN: 10.0000 meq | INTRAVENOUS | Status: AC

## 2011-10-19 MED ORDER — MAGNESIUM SULFATE 40 MG/ML IJ SOLN
4.0000 g | Freq: Once | INTRAMUSCULAR | Status: AC
  Administered 2011-10-19: 4 g via INTRAVENOUS
  Filled 2011-10-19: qty 100

## 2011-10-19 MED ORDER — OXYCODONE HCL 5 MG PO TABS
5.0000 mg | ORAL_TABLET | ORAL | Status: DC | PRN
  Administered 2011-10-21 – 2011-10-22 (×3): 5 mg via ORAL
  Filled 2011-10-19 (×3): qty 1

## 2011-10-19 MED ORDER — SODIUM CHLORIDE 0.9 % IV SOLN: INTRAVENOUS | Status: DC

## 2011-10-19 MED ORDER — HEPARIN SODIUM (PORCINE) 1000 UNIT/ML IJ SOLN
INTRAMUSCULAR | Status: DC | PRN
  Administered 2011-10-19: 28000 [IU] via INTRAVENOUS

## 2011-10-19 MED ORDER — BISACODYL 10 MG RE SUPP: 10.0000 mg | Freq: Every day | RECTAL | Status: DC

## 2011-10-19 MED ORDER — METOPROLOL TARTRATE 25 MG/10 ML ORAL SUSPENSION
12.5000 mg | Freq: Two times a day (BID) | ORAL | Status: DC
  Filled 2011-10-19 (×3): qty 5

## 2011-10-19 MED ORDER — HEMOSTATIC AGENTS (NO CHARGE) OPTIME
TOPICAL | Status: DC | PRN
  Administered 2011-10-19: 1 via TOPICAL

## 2011-10-19 MED ORDER — INSULIN ASPART 100 UNIT/ML ~~LOC~~ SOLN: 0.0000 [IU] | SUBCUTANEOUS | Status: DC

## 2011-10-19 MED ORDER — LIDOCAINE HCL (CARDIAC) 20 MG/ML IV SOLN
INTRAVENOUS | Status: DC | PRN
  Administered 2011-10-19: 100 mg via INTRAVENOUS

## 2011-10-19 MED ORDER — DEXMEDETOMIDINE HCL IN NACL 200 MCG/50ML IV SOLN
0.1000 ug/kg/h | INTRAVENOUS | Status: DC
  Administered 2011-10-20: 0.1 ug/kg/h via INTRAVENOUS
  Filled 2011-10-19: qty 50

## 2011-10-19 MED ORDER — METOPROLOL TARTRATE 12.5 MG HALF TABLET
12.5000 mg | ORAL_TABLET | Freq: Two times a day (BID) | ORAL | Status: DC
  Filled 2011-10-19 (×5): qty 1

## 2011-10-19 MED ORDER — SODIUM CHLORIDE 0.9 % IV SOLN
INTRAVENOUS | Status: DC
  Filled 2011-10-19: qty 1

## 2011-10-19 MED ORDER — PROPOFOL 10 MG/ML IV BOLUS
INTRAVENOUS | Status: DC | PRN
  Administered 2011-10-19: 80 mg via INTRAVENOUS

## 2011-10-19 MED ORDER — ALBUMIN HUMAN 5 % IV SOLN
250.0000 mL | INTRAVENOUS | Status: AC | PRN
  Administered 2011-10-20 (×2): 250 mL via INTRAVENOUS

## 2011-10-19 MED ORDER — NITROGLYCERIN IN D5W 200-5 MCG/ML-% IV SOLN: 0.0000 ug/min | INTRAVENOUS | Status: DC

## 2011-10-19 MED ORDER — ASPIRIN EC 325 MG PO TBEC
325.0000 mg | DELAYED_RELEASE_TABLET | Freq: Every day | ORAL | Status: DC
  Administered 2011-10-20 – 2011-10-22 (×3): 325 mg via ORAL
  Filled 2011-10-19 (×3): qty 1

## 2011-10-19 MED ORDER — SODIUM CHLORIDE 0.9 % IJ SOLN: 3.0000 mL | INTRAMUSCULAR | Status: DC | PRN

## 2011-10-19 MED ORDER — PROTAMINE SULFATE 10 MG/ML IV SOLN
INTRAVENOUS | Status: DC | PRN
  Administered 2011-10-19: 28 mg via INTRAVENOUS

## 2011-10-19 MED ORDER — MORPHINE SULFATE 2 MG/ML IJ SOLN: 1.0000 mg | INTRAMUSCULAR | Status: AC | PRN

## 2011-10-19 MED ORDER — ASPIRIN 81 MG PO CHEW: 324.0000 mg | CHEWABLE_TABLET | Freq: Every day | ORAL | Status: DC

## 2011-10-19 MED ORDER — ACETAMINOPHEN 160 MG/5ML PO SOLN: 650.0000 mg | ORAL | Status: AC

## 2011-10-19 MED ORDER — ROCURONIUM BROMIDE 100 MG/10ML IV SOLN
INTRAVENOUS | Status: DC | PRN
  Administered 2011-10-19 (×2): 50 mg via INTRAVENOUS

## 2011-10-19 MED ORDER — FAMOTIDINE IN NACL 20-0.9 MG/50ML-% IV SOLN
20.0000 mg | Freq: Two times a day (BID) | INTRAVENOUS | Status: AC
  Administered 2011-10-19: 20 mg via INTRAVENOUS

## 2011-10-19 MED ORDER — LACTATED RINGERS IV SOLN: 500.0000 mL | Freq: Once | INTRAVENOUS | Status: AC | PRN

## 2011-10-19 MED ORDER — SODIUM CHLORIDE 0.45 % IV SOLN
INTRAVENOUS | Status: DC
  Administered 2011-10-19: 13:00:00 via INTRAVENOUS

## 2011-10-19 MED ORDER — FENTANYL CITRATE 0.05 MG/ML IJ SOLN
INTRAMUSCULAR | Status: DC | PRN
  Administered 2011-10-19 (×2): 50 ug via INTRAVENOUS
  Administered 2011-10-19: 200 ug via INTRAVENOUS
  Administered 2011-10-19: 250 ug via INTRAVENOUS
  Administered 2011-10-19: 1000 ug via INTRAVENOUS
  Administered 2011-10-19: 200 ug via INTRAVENOUS

## 2011-10-19 MED FILL — Magnesium Sulfate Inj 50%: INTRAMUSCULAR | Qty: 10 | Status: AC

## 2011-10-19 MED FILL — Potassium Chloride Inj 2 mEq/ML: INTRAVENOUS | Qty: 40 | Status: AC

## 2011-10-19 SURGICAL SUPPLY — 101 items
ADH SKN CLS APL DERMABOND .7 (GAUZE/BANDAGES/DRESSINGS) ×2
ATTRACTOMAT 16X20 MAGNETIC DRP (DRAPES) ×2 IMPLANT
BAG DECANTER FOR FLEXI CONT (MISCELLANEOUS) ×2 IMPLANT
BANDAGE ELASTIC 4 VELCRO ST LF (GAUZE/BANDAGES/DRESSINGS) ×2 IMPLANT
BANDAGE ELASTIC 6 VELCRO ST LF (GAUZE/BANDAGES/DRESSINGS) ×2 IMPLANT
BANDAGE GAUZE ELAST BULKY 4 IN (GAUZE/BANDAGES/DRESSINGS) ×2 IMPLANT
BASKET HEART (ORDER IN 25'S) (MISCELLANEOUS) ×1
BASKET HEART (ORDER IN 25S) (MISCELLANEOUS) ×1 IMPLANT
BLADE STERNUM SYSTEM 6 (BLADE) ×2 IMPLANT
BLADE SURG 11 STRL SS (BLADE) ×1 IMPLANT
CANISTER SUCTION 2500CC (MISCELLANEOUS) ×2 IMPLANT
CATH ROBINSON RED A/P 18FR (CATHETERS) ×4 IMPLANT
CATH THORACIC 28FR (CATHETERS) ×2 IMPLANT
CATH THORACIC 28FR RT ANG (CATHETERS) IMPLANT
CATH THORACIC 36FR (CATHETERS) ×2 IMPLANT
CATH THORACIC 36FR RT ANG (CATHETERS) ×2 IMPLANT
CLIP TI MEDIUM 24 (CLIP) IMPLANT
CLIP TI WIDE RED SMALL 24 (CLIP) IMPLANT
CLOTH BEACON ORANGE TIMEOUT ST (SAFETY) ×2 IMPLANT
COVER SURGICAL LIGHT HANDLE (MISCELLANEOUS) ×2 IMPLANT
CRADLE DONUT ADULT HEAD (MISCELLANEOUS) ×2 IMPLANT
DERMABOND ADVANCED (GAUZE/BANDAGES/DRESSINGS) ×2
DERMABOND ADVANCED .7 DNX12 (GAUZE/BANDAGES/DRESSINGS) IMPLANT
DRAPE CARDIOVASCULAR INCISE (DRAPES) ×2
DRAPE SLUSH MACHINE 52X66 (DRAPES) IMPLANT
DRAPE SLUSH/WARMER DISC (DRAPES) ×1 IMPLANT
DRAPE SRG 135X102X78XABS (DRAPES) ×1 IMPLANT
DRSG COVADERM 4X14 (GAUZE/BANDAGES/DRESSINGS) ×2 IMPLANT
ELECT CAUTERY BLADE 6.4 (BLADE) ×2 IMPLANT
ELECT REM PT RETURN 9FT ADLT (ELECTROSURGICAL) ×4
ELECTRODE REM PT RTRN 9FT ADLT (ELECTROSURGICAL) ×2 IMPLANT
GLOVE BIO SURGEON STRL SZ 6 (GLOVE) ×2 IMPLANT
GLOVE BIO SURGEON STRL SZ 6.5 (GLOVE) ×4 IMPLANT
GLOVE BIO SURGEON STRL SZ7 (GLOVE) IMPLANT
GLOVE BIO SURGEON STRL SZ7.5 (GLOVE) IMPLANT
GLOVE BIOGEL PI IND STRL 6 (GLOVE) IMPLANT
GLOVE BIOGEL PI IND STRL 6.5 (GLOVE) IMPLANT
GLOVE BIOGEL PI IND STRL 7.0 (GLOVE) IMPLANT
GLOVE BIOGEL PI INDICATOR 6 (GLOVE)
GLOVE BIOGEL PI INDICATOR 6.5 (GLOVE)
GLOVE BIOGEL PI INDICATOR 7.0 (GLOVE) ×2
GLOVE EUDERMIC 7 POWDERFREE (GLOVE) ×4 IMPLANT
GLOVE ORTHO TXT STRL SZ7.5 (GLOVE) IMPLANT
GOWN PREVENTION PLUS XLARGE (GOWN DISPOSABLE) ×2 IMPLANT
GOWN STRL NON-REIN LRG LVL3 (GOWN DISPOSABLE) ×10 IMPLANT
HEMOSTAT POWDER SURGIFOAM 1G (HEMOSTASIS) ×6 IMPLANT
HEMOSTAT SURGICEL 2X14 (HEMOSTASIS) ×2 IMPLANT
INSERT FOGARTY 61MM (MISCELLANEOUS) IMPLANT
INSERT FOGARTY XLG (MISCELLANEOUS) IMPLANT
KIT BASIN OR (CUSTOM PROCEDURE TRAY) ×2 IMPLANT
KIT CATH CPB BARTLE (MISCELLANEOUS) ×2 IMPLANT
KIT ROOM TURNOVER OR (KITS) ×2 IMPLANT
KIT SUCTION CATH 14FR (SUCTIONS) ×2 IMPLANT
KIT VASOVIEW W/TROCAR VH 2000 (KITS) ×2 IMPLANT
NS IRRIG 1000ML POUR BTL (IV SOLUTION) ×11 IMPLANT
PACK OPEN HEART (CUSTOM PROCEDURE TRAY) ×2 IMPLANT
PAD ARMBOARD 7.5X6 YLW CONV (MISCELLANEOUS) ×4 IMPLANT
PENCIL BUTTON HOLSTER BLD 10FT (ELECTRODE) ×2 IMPLANT
PUNCH AORTIC ROTATE 4.0MM (MISCELLANEOUS) IMPLANT
PUNCH AORTIC ROTATE 4.5MM 8IN (MISCELLANEOUS) ×2 IMPLANT
PUNCH AORTIC ROTATE 5MM 8IN (MISCELLANEOUS) IMPLANT
SPONGE GAUZE 4X4 12PLY (GAUZE/BANDAGES/DRESSINGS) ×6 IMPLANT
SPONGE INTESTINAL PEANUT (DISPOSABLE) IMPLANT
SPONGE LAP 18X18 X RAY DECT (DISPOSABLE) ×1 IMPLANT
SPONGE LAP 4X18 X RAY DECT (DISPOSABLE) ×2 IMPLANT
SUT BONE WAX W31G (SUTURE) ×2 IMPLANT
SUT MNCRL AB 4-0 PS2 18 (SUTURE) ×2 IMPLANT
SUT PROLENE 3 0 SH DA (SUTURE) IMPLANT
SUT PROLENE 3 0 SH1 36 (SUTURE) ×2 IMPLANT
SUT PROLENE 4 0 RB 1 (SUTURE)
SUT PROLENE 4 0 SH DA (SUTURE) IMPLANT
SUT PROLENE 4-0 RB1 .5 CRCL 36 (SUTURE) IMPLANT
SUT PROLENE 5 0 C 1 36 (SUTURE) IMPLANT
SUT PROLENE 6 0 C 1 30 (SUTURE) IMPLANT
SUT PROLENE 7 0 BV 1 (SUTURE) IMPLANT
SUT PROLENE 7 0 BV1 MDA (SUTURE) ×3 IMPLANT
SUT PROLENE 8 0 BV175 6 (SUTURE) IMPLANT
SUT SILK  1 MH (SUTURE)
SUT SILK 1 MH (SUTURE) IMPLANT
SUT STEEL STERNAL CCS#1 18IN (SUTURE) IMPLANT
SUT STEEL SZ 6 DBL 3X14 BALL (SUTURE) ×2 IMPLANT
SUT TEM PAC WIRE 2 0 SH (SUTURE) ×1 IMPLANT
SUT VIC AB 1 CTX 36 (SUTURE) ×4
SUT VIC AB 1 CTX36XBRD ANBCTR (SUTURE) ×2 IMPLANT
SUT VIC AB 2-0 CT1 27 (SUTURE) ×4
SUT VIC AB 2-0 CT1 TAPERPNT 27 (SUTURE) IMPLANT
SUT VIC AB 2-0 CTX 27 (SUTURE) IMPLANT
SUT VIC AB 3-0 SH 27 (SUTURE)
SUT VIC AB 3-0 SH 27X BRD (SUTURE) IMPLANT
SUT VIC AB 3-0 X1 27 (SUTURE) IMPLANT
SUT VICRYL 4-0 PS2 18IN ABS (SUTURE) IMPLANT
SUTURE E-PAK OPEN HEART (SUTURE) ×2 IMPLANT
SYSTEM SAHARA CHEST DRAIN ATS (WOUND CARE) ×2 IMPLANT
TAPE CLOTH SURG 4X10 WHT LF (GAUZE/BANDAGES/DRESSINGS) ×2 IMPLANT
TOWEL OR 17X24 6PK STRL BLUE (TOWEL DISPOSABLE) ×2 IMPLANT
TOWEL OR 17X26 10 PK STRL BLUE (TOWEL DISPOSABLE) ×2 IMPLANT
TRAY FOLEY IC TEMP SENS 14FR (CATHETERS) ×2 IMPLANT
TUBE SUCT INTRACARD DLP 20F (MISCELLANEOUS) ×2 IMPLANT
TUBING INSUFFLATION 10FT LAP (TUBING) ×2 IMPLANT
UNDERPAD 30X30 INCONTINENT (UNDERPADS AND DIAPERS) ×2 IMPLANT
WATER STERILE IRR 1000ML POUR (IV SOLUTION) ×4 IMPLANT

## 2011-10-19 NOTE — Anesthesia Preprocedure Evaluation (Addendum)
Anesthesia Evaluation  Patient identified by MRN, date of birth, ID band Patient awake    Reviewed: Allergy & Precautions, H&P , NPO status , Patient's Chart, lab work & pertinent test results, reviewed documented beta blocker date and time   History of Anesthesia Complications Negative for: history of anesthetic complications  Airway Mallampati: II TM Distance: >3 FB Neck ROM: Full    Dental  (+) Teeth Intact, Caps and Dental Advisory Given   Pulmonary neg pulmonary ROS,  breath sounds clear to auscultation  Pulmonary exam normal       Cardiovascular hypertension, Pt. on medications + CAD Rhythm:Regular     Neuro/Psych    GI/Hepatic Neg liver ROS,   Endo/Other  negative endocrine ROS  Renal/GU Renal InsufficiencyRenal diseaseRenal artery stenosis, hx prostate CA.   Recurrent UTIs    Musculoskeletal   Abdominal   Peds  Hematology negative hematology ROS (+)   Anesthesia Other Findings   Reproductive/Obstetrics                          Anesthesia Physical Anesthesia Plan  ASA: III  Anesthesia Plan: General   Post-op Pain Management:    Induction: Intravenous  Airway Management Planned: Oral ETT  Additional Equipment: Arterial line, PA Cath, CVP and Ultrasound Guidance Line Placement  Intra-op Plan: Utilization Of Total Body Hypothermia per surgeon request  Post-operative Plan: Post-operative intubation/ventilation  Informed Consent: I have reviewed the patients History and Physical, chart, labs and discussed the procedure including the risks, benefits and alternatives for the proposed anesthesia with the patient or authorized representative who has indicated his/her understanding and acceptance.   Dental advisory given  Plan Discussed with: CRNA and Surgeon  Anesthesia Plan Comments:        Anesthesia Quick Evaluation

## 2011-10-19 NOTE — Anesthesia Procedure Notes (Addendum)
Procedure Name: Intubation Date/Time: 10/19/2011 8:11 AM Performed by: Ned Grace Pre-anesthesia Checklist: Patient identified, Emergency Drugs available, Suction available, Patient being monitored and Timeout performed Patient Re-evaluated:Patient Re-evaluated prior to inductionOxygen Delivery Method: Circle system utilized Preoxygenation: Pre-oxygenation with 100% oxygen Intubation Type: IV induction Ventilation: Mask ventilation without difficulty and Oral airway inserted - appropriate to patient size Laryngoscope Size: Mac and 3 Grade View: Grade I Tube type: Oral Number of attempts: 1 Airway Equipment and Method: Stylet Placement Confirmation: ETT inserted through vocal cords under direct vision,  breath sounds checked- equal and bilateral and positive ETCO2 Secured at: 22 cm Tube secured with: Tape Dental Injury: Teeth and Oropharynx as per pre-operative assessment     Procedures: Right IJ Gordy Councilman Catheter Insertion:[id number]: The patient was identified and consent obtained.  TO was performed, and full barrier precautions were used.  The skin was anesthetized with lidocaine-4cc plain with 25g needle.  Once the vein was located with the 22 ga. needle using ultrasound guidance , the wire was inserted into the vein.  The wire location was confirmed with ultrasound.  The tissue was dilated and the 8.5 [place] cordis catheter was carefully inserted. Afterwards Gordy Councilman catheter was inserted. PA catheter at 44cm.  The patient tolerated the procedure well.   CE

## 2011-10-19 NOTE — Anesthesia Postprocedure Evaluation (Signed)
Anesthesia Post Note  Patient: [name]  Procedure(s) Performed: Procedure(s) (LRB): CORONARY ARTERY BYPASS GRAFTING (CABG) (N/A)  Anesthesia type: General  Patient location: ICU  Post pain: Pain level controlled  Post assessment: Post-op Vital signs reviewed  Last Vitals:  Filed Vitals:   10/19/11 1245  BP:   Pulse: 90  Temp: 36.3 C  Resp: 13    Post vital signs: stable  Level of consciousness: Patient remains intubated per anesthesia plan  Complications: No apparent anesthesia complications

## 2011-10-19 NOTE — Transfer of Care (Signed)
Immediate Anesthesia Transfer of Care Note  Patient: [name]  Procedure(s) Performed: Procedure(s) (LRB) with comments: CORONARY ARTERY BYPASS GRAFTING (CABG) (N/A) - times three using Left Internal Mammary Artery and Right Greater Saphenouse Vein Graft harvested Endoscopically  Patient Location: SICU  Anesthesia Type: General  Level of Consciousness: Patient remains intubated per anesthesia plan  Airway & Oxygen Therapy: Patient remains intubated per anesthesia plan and Patient placed on Ventilator (see vital sign flow sheet for setting)  Post-op Assessment: Report given to SICU RN.  Post vital signs: Reviewed and stable  Complications: No apparent anesthesia complications

## 2011-10-19 NOTE — Progress Notes (Signed)
TCTS BRIEF SICU PROGRESS NOTE  Day of Surgery  S/P Procedure(s) (LRB): CORONARY ARTERY BYPASS GRAFTING (CABG) (N/A)   Starting to wake on vent Stable hemodynamics off all drips NSR/AAI paced Chest tube output low UOP adequate Labs okay  Plan: Continue routine early postop  [name] H 10/19/2011 4:04 PM

## 2011-10-19 NOTE — Preoperative (Addendum)
Beta Blockers   Reason not to administer Beta Blockers: Pt took metoprolol this am at 0556.

## 2011-10-19 NOTE — Procedures (Signed)
Extubation Procedure Note  Patient Details:   Name: [name] DOB: [birth year] MRN: [id number]   Airway Documentation:     Evaluation  O2 sats: stable throughout Complications: No apparent complications Patient did tolerate procedure well. Bilateral Breath Sounds: Clear   YesNIF -35, VC 963mls, Pt extubated to a 4lpm [place] with a Sp02 92%. RT will continue to monitor pt progress.   Cordella Register 10/19/2011, 5:20 PM

## 2011-10-19 NOTE — Brief Op Note (Addendum)
10/15/2011 - 10/19/2011  10:47 AM  PATIENT:  [name]  78 y.o. male  PRE-OPERATIVE DIAGNOSIS:  CAD  POST-OPERATIVE DIAGNOSIS:  Coronary Artery Disease  PROCEDURE:  Procedure(s) (LRB) with comments: CORONARY ARTERY BYPASS GRAFTING x3  LIMA to LAD  SVG to OM1  SVG to PDA  ENDOSCOPIC SAPHENOUS VEIN HARVEST RIGHT LEG  Findings:  Fresh thrombus in PDA removed.  The PL branch is small, diffusely diseased, intramyocardial, not graftable.  SURGEON:  Surgeon(s) and Role:    * Gaye Pollack, MD - Primary  PHYSICIAN ASSISTANT: Erin Barrett PA-C  ANESTHESIA:   general  EBL:  Total I/O In: 250 [I.V.:250] Out: 400 [Urine:400]  BLOOD ADMINISTERED: CC CELLSAVER  DRAINS: Left pleural chest tube, mediastinal chest drains   LOCAL MEDICATIONS USED:  NONE  SPECIMEN:  No Specimen  DISPOSITION OF SPECIMEN:  N/A  COUNTS:  YES  TOURNIQUET:  * No tourniquets in log *  DICTATION: .Dragon Dictation  PLAN OF CARE: Admit to inpatient   PATIENT DISPOSITION:  ICU - intubated and hemodynamically stable.   Delay start of Pharmacological VTE agent (>24hrs) due to surgical blood loss or risk of bleeding: yes

## 2011-10-20 ENCOUNTER — Inpatient Hospital Stay (HOSPITAL_COMMUNITY): Payer: Medicare Other

## 2011-10-20 LAB — CBC
HCT: 31.1 % — ABNORMAL LOW (ref 39.0–52.0)
MCH: 25.7 pg — ABNORMAL LOW (ref 26.0–34.0)
MCH: 26.6 pg (ref 26.0–34.0)
MCHC: 33.3 g/dL (ref 30.0–36.0)
MCV: 79.1 fL (ref 78.0–100.0)
Platelets: 149 10*3/uL — ABNORMAL LOW (ref 150–400)
Platelets: 172 10*3/uL (ref 150–400)
RBC: 3.83 MIL/uL — ABNORMAL LOW (ref 4.22–5.81)
RDW: 14.7 % (ref 11.5–15.5)
WBC: 8 10*3/uL (ref 4.0–10.5)

## 2011-10-20 LAB — CREATININE, SERUM: Creatinine, Ser: 1.86 mg/dL — ABNORMAL HIGH (ref 0.50–1.35)

## 2011-10-20 LAB — GLUCOSE, CAPILLARY
Glucose-Capillary: 111 mg/dL — ABNORMAL HIGH (ref 70–99)
Glucose-Capillary: 119 mg/dL — ABNORMAL HIGH (ref 70–99)
Glucose-Capillary: 106 mg/dL — ABNORMAL HIGH (ref 70–99)
Glucose-Capillary: 111 mg/dL — ABNORMAL HIGH (ref 70–99)
Glucose-Capillary: 124 mg/dL — ABNORMAL HIGH (ref 70–99)
Glucose-Capillary: 105 mg/dL — ABNORMAL HIGH (ref 70–99)

## 2011-10-20 LAB — BASIC METABOLIC PANEL
BUN: 22 mg/dL (ref 6–23)
CO2: 17 mEq/L — ABNORMAL LOW (ref 19–32)
Calcium: 8.4 mg/dL (ref 8.4–10.5)
Chloride: 109 mEq/L (ref 96–112)
Creatinine, Ser: 1.78 mg/dL — ABNORMAL HIGH (ref 0.50–1.35)
GFR calc Af Amer: 39 mL/min — ABNORMAL LOW (ref >90)

## 2011-10-20 MED ORDER — POTASSIUM CHLORIDE 10 MEQ/50ML IV SOLN
10.0000 meq | INTRAVENOUS | Status: AC
  Administered 2011-10-20 – 2011-10-21 (×3): 10 meq via INTRAVENOUS

## 2011-10-20 MED ORDER — FUROSEMIDE 10 MG/ML IJ SOLN
20.0000 mg | Freq: Four times a day (QID) | INTRAMUSCULAR | Status: DC
  Administered 2011-10-20: 20 mg via INTRAVENOUS

## 2011-10-20 MED ORDER — FUROSEMIDE 10 MG/ML IJ SOLN
20.0000 mg | Freq: Four times a day (QID) | INTRAMUSCULAR | Status: AC
  Administered 2011-10-20 – 2011-10-21 (×2): 20 mg via INTRAVENOUS

## 2011-10-20 MED ORDER — INSULIN ASPART 100 UNIT/ML ~~LOC~~ SOLN
0.0000 [IU] | SUBCUTANEOUS | Status: DC
  Administered 2011-10-20: 4 [IU] via SUBCUTANEOUS
  Administered 2011-10-20 – 2011-10-21 (×3): 2 [IU] via SUBCUTANEOUS

## 2011-10-20 MED ORDER — POTASSIUM CHLORIDE 10 MEQ/50ML IV SOLN
INTRAVENOUS | Status: AC
  Administered 2011-10-20: 10 meq via INTRAVENOUS
  Filled 2011-10-20: qty 100

## 2011-10-20 MED ORDER — POLYVINYL ALCOHOL 1.4 % OP SOLN
2.0000 [drp] | OPHTHALMIC | Status: DC | PRN
  Administered 2011-10-20 – 2011-10-23 (×3): 2 [drp] via OPHTHALMIC
  Filled 2011-10-20: qty 15

## 2011-10-20 MED ORDER — FOLIC ACID 1 MG PO TABS
1.0000 mg | ORAL_TABLET | Freq: Every day | ORAL | Status: DC
  Administered 2011-10-20 – 2011-10-24 (×5): 1 mg via ORAL
  Filled 2011-10-20 (×5): qty 1

## 2011-10-20 MED ORDER — FUROSEMIDE 10 MG/ML IJ SOLN
INTRAMUSCULAR | Status: AC
  Administered 2011-10-20: 20 mg via INTRAVENOUS
  Filled 2011-10-20: qty 4

## 2011-10-20 NOTE — Progress Notes (Signed)
TCTS BRIEF SICU PROGRESS NOTE  1 Day Post-Op  S/P Procedure(s) (LRB): CORONARY ARTERY BYPASS GRAFTING (CABG) (N/A)   Feels much better NSR w/ stable BP UOP adequate  Plan: Continue routine care  OWEN,[name] 6:21 PM

## 2011-10-20 NOTE — Progress Notes (Signed)
   CARDIOTHORACIC SURGERY PROGRESS NOTE   R1 Day Post-Op Procedure(s) (LRB): CORONARY ARTERY BYPASS GRAFTING (CABG) (N/A)  Subjective: Moderate soreness in chest.  Otherwise feels and looks well.  Objective: Vital signs: BP Readings from Last 1 Encounters:  10/20/11 111/53   Pulse Readings from Last 1 Encounters:  10/20/11 70   Resp Readings from Last 1 Encounters:  10/20/11 16   Temp Readings from Last 1 Encounters:  10/20/11 99 F (37.2 C)     Hemodynamics: PAP: (23-38)/(10-22) 30/10 mmHg CO:  [3.6 L/min-5.9 L/min] 5.4 L/min CI:  [1.8 L/min/m2-2.9 L/min/m2] 2.7 L/min/m2  Physical Exam:  Rhythm:   sinus  Breath sounds: clear  Heart sounds:  RRR  Incisions:  Dressings dry  Abdomen:  soft  Extremities:  warm   Intake/Output from previous day: 10/11 0701 - 10/12 0700 In: 4795.5 [P.O.:600; I.V.:3391.5; Blood:224; IV Piggyback:580] Out: [id number] [Urine:2475; Blood:550; Chest Tube:410] Intake/Output this shift: Total I/O In: 80 [I.V.:80] Out: 75 [Urine:75]  Lab Results:  Basename 10/20/11 0500 10/19/11 1831 10/19/11 1820  WBC 8.0 -- 9.3  HGB 10.1* 11.6* --  HCT 31.1* 34.0* --  PLT 149* -- 153   BMET:  Basename 10/20/11 0500 10/19/11 1831 10/19/11 0550  NA 139 140 --  K 4.3 4.8 --  CL 109 111 --  CO2 17* -- 21  GLUCOSE 112* 111* --  BUN 22 22 --  CREATININE 1.78* 1.70* --  CALCIUM 8.4 -- 9.5    CBG (last 3)   Basename 10/20/11 0753 10/20/11 0356 10/19/11 2348  GLUCAP 117* 105* 106*   ABG    Component Value Date/Time   PHART 7.328* 10/19/2011 1924   HCO3 18.0* 10/19/2011 1924   TCO2 19 10/19/2011 1924   ACIDBASEDEF 7.0* 10/19/2011 1924   O2SAT 94.0 10/19/2011 1924   CXR: *RADIOLOGY REPORT*  Clinical Data: Postop  PORTABLE CHEST - 1 VIEW  Comparison: Yesterday  Findings: Endotracheal and NG tubes removed. Swan-Ganz catheter  and chest tubes remain in place. Low volumes. Left basilar  atelectasis. No pneumothorax. No pulmonary edema.    IMPRESSION:  Extubated. No pneumothorax. No CHF. Left basilar atelectasis.  Original Report Authenticated By: Jamas Lav, M.D.   Assessment/Plan: S/P Procedure(s) (LRB): CORONARY ARTERY BYPASS GRAFTING (CABG) (N/A)  Doing well POD1 Expected post op acute blood loss anemia, mild, stable Expected post op volume excess, mild   Mobilize  D/C tubes and lines  Diuresis  Routine care   OWEN,CLARENCE H 10/20/2011 10:21 AM

## 2011-10-20 NOTE — Progress Notes (Signed)
COURTESY   [name] is awake and alert. He reports modest discomfort which he attributes to chest tubes otherwise no pain. His vital signs are stable, he appears just a bit puffy.   Spoke with is wife, [name].  Thanks for great care. Will follow socially.

## 2011-10-20 NOTE — Op Note (Signed)
[name], [name]            ACCOUNT NO.:  [id number]  MEDICAL RECORD NO.:  [id number]  LOCATION:  2307                         FACILITY:  [facility]  PHYSICIAN:  Gilford Raid, M.D.     DATE OF BIRTH:  [birth year]  DATE OF PROCEDURE:  10/19/2011 DATE OF DISCHARGE:                              OPERATIVE REPORT   PREOPERATIVE DIAGNOSIS:  Severe three-vessel coronary artery disease, status post non-ST-segment elevation myocardial infarction.  POSTOPERATIVE DIAGNOSIS:  Severe three-vessel coronary artery disease, status post non-ST-segment elevation myocardial infarction.  OPERATIVE PROCEDURE:  Median sternotomy, extracorporeal circulation, coronary artery bypass graft surgery x3 using a left internal mammary artery graft to left anterior descending coronary artery, with a saphenous vein graft to the obtuse marginal branch of left circumflex coronary artery, and a saphenous vein graft to the posterior descending branch of the right coronary artery.  Endoscopic vein harvesting from the right leg.  ATTENDING SURGEON:  Gilford Raid, MD  ASSISTANT:  Ellwood Handler, PA-C  ANESTHESIA:  General endotracheal.  CLINICAL HISTORY:  This patient is an 78 year old gentleman with history of coronary artery disease, status post stenting of the mid right coronary artery and posterior descending coronary artery in 2001.  His last cath in 2009 showed patent stents and no reconstructable disease. His last Myoview examination in August of 2010 showed no significant ischemia.  He has had intermittent dyspnea with exertion over the past year.  Last Saturday, he began to having intermittent substernal chest pain and dyspnea with exertion and at rest, it waxed and waned on Saturday and Sunday.  On Monday, he had recurrent pain and came to the emergency room where he ruled in for non-ST-segment elevation MI.  He has stage III chronic kidney disease with a creatinine around 2. Cardiac catheterization  showed severe 3-vessel coronary artery disease. The culprit appeared to be the right coronary system, which was dominant vessel.  The stent in the mid vessel was widely patent.  Following the stent, there was a long segment of disease up to about 99%.  There was TIMI grade 2 flow distally.  Prior to the bifurcation of the posterior descending and posterolateral branches, there was a 60% stenosis.  The stent in the posterior descending was widely patent.  The posterolateral branch had about 80% stenosis.  The LAD had moderate diffuse disease in the proximal vessel up to about 70%.  The mid-to-distal portion of the vessel was diffusely diseased up to about 90% stenosis, and was not graftable in this location.  The intermediate was a moderate-sized vessel without significant disease.  Left circumflex had a 70-80% proximal stenosis before two marginal branches.  Left ventricular function by echocardiogram was well preserved.  The patient's creatinine remains stable.  It was felt that coronary artery bypass graft surgery is the best treatment to prevent further ischemia and infarction and improve his quality of life.  I discussed the operative procedure with the patient and his wife including alternatives, benefits, and risks including, but not limited to bleeding, blood transfusion, infection, stroke, myocardial infarction, heart block requiring permanent pacemaker, renal failure, organ dysfunction, and death.  He understood all of this and agreed to proceed.  OPERATIVE PROCEDURE:  The patient  was taken to the operating room and placed on the table in supine position.  After induction of general endotracheal anesthesia, a Foley catheter was placed in the bladder using sterile technique.  Then, the chest, abdomen, and both lower extremities were prepped and draped in usual sterile manner.  The chest was entered through a median sternotomy incision and pericardium opened the midline.   Examination of the heart showed good ventricular contractility.  The ascending aorta had no palpable plaques in it.  Then, the left internal mammary artery was harvested from chest wall as a pedicle graft.  This was a medium caliber vessel with excellent blood flow through it.  At the same time, a segment of greater saphenous vein was harvested from the right leg using endoscopic vein harvest technique.  This vein was of medium size and good quality.  Then, the patient was heparinized and when an adequate ACT was obtained, the distal ascending aorta was cannulated using a 20-French aortic cannula for arterial inflow.  Venous outflow was achieved using a two- stage venous cannula for the right atrial appendage.  An antegrade cardioplegia and vent cannula was inserted in the aortic root.  The patient was placed on cardiopulmonary bypass and the distal coronary arteries were identified.  The LAD was a large graftable vessel in its midportion.  The distal third of the vessel was diffusely diseased with calcific plaque.  The left circumflex gave off two marginal branches, one being larger and suitable for grafting.  There was no significant distal disease in it.  The right coronary artery was diffusely diseased. The posterior descending had a stent in its proximal portion.  In about the midportion of the vessel, there was a moderate-sized graftable vessel.  The posterolateral branch was intramyocardial.  I located this vessel and it was relatively small and diffusely diseased.  I did not feel that would be suitable for grafting.  I felt it would be best to allow retrograde flow into this vessel through the posterior descending graft because I was concerned that if I tried to sew a bypass into it, it probably would shut down.  There was evidence of recent inferolateral infarction with some edema and discoloration of the myocardium in this area.  Then, the aorta was crossclamped and 600 mL  of cold blood antegrade cardioplegia was administered in the aortic root with quick arrest of the heart.  Systemic hypothermia to 32 degrees centigrade and topical hypothermia with iced saline was used.  A temperature probe was placed in the septum and insulating pad in the pericardium.  The first distal anastomosis was then performed to the posterior descending coronary artery.  The internal diameter of this vessel was 1.6 mm.  When the vessel was opened in the midportion, there was an acute thrombus present there.  This was a segmental and was completely removed without difficulty.  The conduit used for this anastomosis was a segment of greater saphenous vein and the anastomosis was performed in an end-to-side manner using continuous 7-0 Prolene suture.  Flow was noted through the graft and was excellent.  The second distal anastomosis was performed to the obtuse marginal branch.  The internal diameter of this vessel was also 1.6 mm.  The conduit used was a second segment of greater saphenous vein and the anastomosis was performed in an end-to-side manner using continuous 7-0 suture.  Flow was noted through the graft and was excellent.  Then, another dose of cardioplegia was given.  The third distal  anastomosis was performed at the mid portion of the LAD.  I was able to pass a 1.5-mm probe down through the distal LAD, even though was diffusely diseased.  The conduit used for the left internal mammary graft and was brought through an opening in the left pericardium and anterior to the phrenic nerve.  It was anastomosed to the LAD in an end- to-side manner using continuous 8-0 Prolene suture.  The pedicle was sutured to the epicardium with 6-0 Prolene sutures.  Then, another dose of cardioplegia was given.  With crossclamp in place, the two proximal vein graft vein graft anastomoses were performed to the mid ascending aorta in an end-to-side manner using continuous 6-0 Prolene  suture. Then, the clamp was removed from the mammary artery pedicle.  There was rapid warming of the ventricular septum and return of spontaneous ventricular fibrillation.  The crossclamp removal time was 64 minutes and the patient defibrillated into sinus rhythm.  The proximal and distal anastomoses appeared hemostatic and allowed the grafts satisfactory.  Graft marker was placed around the proximal anastomoses. Two temporary right ventricular and right atrial pacing wires were placed and brought out through the skin.  When the patient was rewarmed to 37 degrees centigrade, he was weaned from cardiopulmonary bypass on no inotropic agents.  Total bypass time was 85 minutes.  Cardiac function appeared excellent with cardiac output of 6 liters/minute.  Protamine was given and the venous and aortic cannulas were removed without difficulty.  Hemostasis was achieved. Three chest tubes were placed with tube in the posterior pericardium , one in the left pleural space and one in the anterior mediastinum.  The sternum was then reapproximated with double #6 stainless steel wires. The fascia was closed with continuous #1 Vicryl suture.  Subcutaneous tissue was closed with continuous 2-0 Vicryl and skin with a 3-0 Vicryl subcuticular closure.  The lower extremity vein harvest site was closed in layers in similar manner.  The sponge, needle, and instrument counts were correct according to the scrub nurse.  Dry sterile dressing was applied over the incisions and around the chest tubes, which were hooked to Pleur-Evac suction.  The patient remained hemodynamically stable and transported to the SICU in guarded, but stable condition.     Gilford Raid, M.D.     BB/MEDQ  D:  10/19/2011  T:  10/20/2011  Job:  [id number]

## 2011-10-21 ENCOUNTER — Inpatient Hospital Stay (HOSPITAL_COMMUNITY): Payer: Medicare Other

## 2011-10-21 LAB — GLUCOSE, CAPILLARY
Glucose-Capillary: 111 mg/dL — ABNORMAL HIGH (ref 70–99)
Glucose-Capillary: 125 mg/dL — ABNORMAL HIGH (ref 70–99)
Glucose-Capillary: 144 mg/dL — ABNORMAL HIGH (ref 70–99)
Glucose-Capillary: 153 mg/dL — ABNORMAL HIGH (ref 70–99)

## 2011-10-21 LAB — POCT I-STAT, CHEM 8
BUN: 25 mg/dL — ABNORMAL HIGH (ref 6–23)
Calcium, Ion: 1.18 mmol/L (ref 1.13–1.30)
Chloride: 107 mEq/L (ref 96–112)
Creatinine, Ser: 2 mg/dL — ABNORMAL HIGH (ref 0.50–1.35)
Glucose, Bld: 125 mg/dL — ABNORMAL HIGH (ref 70–99)
TCO2: 19 mmol/L (ref 0–100)

## 2011-10-21 LAB — CBC
HCT: 30.2 % — ABNORMAL LOW (ref 39.0–52.0)
MCH: 25.2 pg — ABNORMAL LOW (ref 26.0–34.0)
MCV: 78.4 fL (ref 78.0–100.0)
Platelets: 150 10*3/uL (ref 150–400)
RBC: 3.85 MIL/uL — ABNORMAL LOW (ref 4.22–5.81)
WBC: 7.8 10*3/uL (ref 4.0–10.5)

## 2011-10-21 LAB — BASIC METABOLIC PANEL
CO2: 20 mEq/L (ref 19–32)
Calcium: 8.3 mg/dL — ABNORMAL LOW (ref 8.4–10.5)
Chloride: 104 mEq/L (ref 96–112)
Creatinine, Ser: 2.04 mg/dL — ABNORMAL HIGH (ref 0.50–1.35)
Glucose, Bld: 113 mg/dL — ABNORMAL HIGH (ref 70–99)
Sodium: 136 mEq/L (ref 135–145)

## 2011-10-21 MED ORDER — AMIODARONE LOAD VIA INFUSION
150.0000 mg | Freq: Once | INTRAVENOUS | Status: AC
  Administered 2011-10-21: 150 mg via INTRAVENOUS
  Filled 2011-10-21: qty 83.34

## 2011-10-21 MED ORDER — METOPROLOL TARTRATE 25 MG PO TABS
25.0000 mg | ORAL_TABLET | Freq: Two times a day (BID) | ORAL | Status: DC
  Administered 2011-10-22 – 2011-10-24 (×5): 25 mg via ORAL
  Filled 2011-10-21 (×7): qty 1

## 2011-10-21 MED ORDER — POTASSIUM CHLORIDE 10 MEQ/50ML IV SOLN
INTRAVENOUS | Status: AC
  Administered 2011-10-21: 10 meq
  Filled 2011-10-21: qty 100

## 2011-10-21 MED ORDER — AMIODARONE HCL IN DEXTROSE 360-4.14 MG/200ML-% IV SOLN
60.0000 mg/h | INTRAVENOUS | Status: AC
  Administered 2011-10-21 (×3): 60 mg/h via INTRAVENOUS
  Filled 2011-10-21: qty 200

## 2011-10-21 MED ORDER — POTASSIUM CHLORIDE 10 MEQ/50ML IV SOLN
10.0000 meq | INTRAVENOUS | Status: AC
  Administered 2011-10-21 (×3): 10 meq via INTRAVENOUS
  Filled 2011-10-21 (×3): qty 50

## 2011-10-21 MED ORDER — AMIODARONE HCL IN DEXTROSE 360-4.14 MG/200ML-% IV SOLN
30.0000 mg/h | INTRAVENOUS | Status: DC
  Administered 2011-10-21: 30 mg/h via INTRAVENOUS
  Filled 2011-10-21 (×4): qty 200

## 2011-10-21 MED ORDER — POTASSIUM CHLORIDE 10 MEQ/50ML IV SOLN
INTRAVENOUS | Status: AC
  Administered 2011-10-21: 10 meq via INTRAVENOUS
  Filled 2011-10-21: qty 50

## 2011-10-21 MED ORDER — AMIODARONE HCL IN DEXTROSE 360-4.14 MG/200ML-% IV SOLN
INTRAVENOUS | Status: AC
  Administered 2011-10-21: 60 mg/h via INTRAVENOUS
  Filled 2011-10-21: qty 200

## 2011-10-21 MED ORDER — FUROSEMIDE 10 MG/ML IJ SOLN
INTRAMUSCULAR | Status: AC
  Filled 2011-10-21: qty 4

## 2011-10-21 NOTE — Progress Notes (Signed)
   CARDIOTHORACIC SURGERY PROGRESS NOTE   R2 Days Post-Op Procedure(s) (LRB): CORONARY ARTERY BYPASS GRAFTING (CABG) (N/A)  Subjective: Feels well.  No complaints.  Objective: Vital signs: BP Readings from Last 1 Encounters:  10/21/11 97/59   Pulse Readings from Last 1 Encounters:  10/21/11 68   Resp Readings from Last 1 Encounters:  10/21/11 7   Temp Readings from Last 1 Encounters:  10/21/11 98 F (36.7 C) Oral    Hemodynamics:    Physical Exam:  Rhythm:   Aflutter w/ HR 120  Breath sounds: clear  Heart sounds:  RRR  Incisions:  Clean and dry  Abdomen:  soft  Extremities:  warm   Intake/Output from previous day: 10/12 0701 - 10/13 0700 In: 1120 [P.O.:480; I.V.:540; IV Piggyback:100] Out: 2625 [Urine:2625] Intake/Output this shift: Total I/O In: 126.6 [P.O.:60; I.V.:66.6] Out: 950 [Urine:950]  Lab Results:  [facility] 10/21/11 0450 10/20/11 1625  WBC 7.8 8.0  HGB 9.7* 10.2*  HCT 30.2* 30.6*  PLT 150 172   BMET:  Basename 10/21/11 0450 10/20/11 1625 10/20/11 0500  NA 136 -- 139  K 3.2* -- 4.3  CL 104 -- 109  CO2 20 -- 17*  GLUCOSE 113* -- 112*  BUN 28* -- 22  CREATININE 2.04* 1.86* --  CALCIUM 8.3* -- 8.4    CBG (last 3)   Basename 10/21/11 0740 10/21/11 0358 10/21/11 0014  GLUCAP 115* 113* 125*   ABG    Component Value Date/Time   PHART 7.328* 10/19/2011 1924   HCO3 18.0* 10/19/2011 1924   TCO2 19 10/19/2011 1924   ACIDBASEDEF 7.0* 10/19/2011 1924   O2SAT 94.0 10/19/2011 1924   CXR: *RADIOLOGY REPORT*  Clinical Data: Postop cardiac surgery  PORTABLE CHEST - 1 VIEW  Comparison: 10/20/2011  Findings: Swan-Ganz catheter has been removed with the introducer  left in place. Chest tubes have all been removed and a tiny less  than 1% pneumothorax is suspected. Low volumes. Bibasilar  atelectasis. Normal heart size. No sign of pulmonary edema.  IMPRESSION:  Chest tube removal with a less than 1% left apical pneumothorax.  Bibasilar  atelectasis.  Original Report Authenticated By: Jamas Lav, M.D.   Assessment/Plan: S/P Procedure(s) (LRB): CORONARY ARTERY BYPASS GRAFTING (CABG) (N/A)  Overall doing well POD2 but went into Aflutter this am with rapid HR Expected post op acute blood loss anemia, mild, stable Expected post op volume excess, mild, diuresing well Hypokalemia secondary to diuresis   Amiodarone IV  Increase metoprolol  Replace K+  Keep in SICU until rhythm under adequate control   OWEN,CLARENCE H 10/21/2011 10:06 AM

## 2011-10-21 NOTE — Progress Notes (Signed)
[facility] BRIEF SICU PROGRESS NOTE  2 Days Post-Op  S/P Procedure(s) (LRB): CORONARY ARTERY BYPASS GRAFTING (CABG) (N/A)   Stable day Back into NSR since mid-day BP stable  Plan: Continue current plan  Keina Mutch H 10/21/2011 6:08 PM

## 2011-10-21 NOTE — Progress Notes (Signed)
COURTESY  Awake and alert, no complaints. Has had some foley catheter discomfort, has had a small BM. Chest tubes out. Noted to have rate controlled A. Fib - amiodarone given. BP just a little soft.  He is pleased with his progress. Probably for transfer out of the unit today.

## 2011-10-22 ENCOUNTER — Encounter (HOSPITAL_COMMUNITY): Payer: Self-pay | Admitting: Surgery

## 2011-10-22 ENCOUNTER — Inpatient Hospital Stay (HOSPITAL_COMMUNITY): Payer: Medicare Other

## 2011-10-22 LAB — CBC
MCH: 25.5 pg — ABNORMAL LOW (ref 26.0–34.0)
MCV: 77.9 fL — ABNORMAL LOW (ref 78.0–100.0)
Platelets: 224 10*3/uL (ref 150–400)
RDW: 14.7 % (ref 11.5–15.5)
WBC: 10.3 10*3/uL (ref 4.0–10.5)

## 2011-10-22 LAB — BASIC METABOLIC PANEL
Calcium: 8.9 mg/dL (ref 8.4–10.5)
Creatinine, Ser: 2.13 mg/dL — ABNORMAL HIGH (ref 0.50–1.35)
GFR calc Af Amer: 31 mL/min — ABNORMAL LOW (ref >90)
Sodium: 136 mEq/L (ref 135–145)

## 2011-10-22 MED ORDER — OXYCODONE HCL 5 MG PO TABS: 5.0000 mg | ORAL_TABLET | ORAL | Status: DC | PRN

## 2011-10-22 MED ORDER — ACETAMINOPHEN 325 MG PO TABS: 650.0000 mg | ORAL_TABLET | Freq: Four times a day (QID) | ORAL | Status: DC | PRN

## 2011-10-22 MED ORDER — BISACODYL 5 MG PO TBEC: 10.0000 mg | DELAYED_RELEASE_TABLET | Freq: Every day | ORAL | Status: DC | PRN

## 2011-10-22 MED ORDER — ASPIRIN EC 325 MG PO TBEC
325.0000 mg | DELAYED_RELEASE_TABLET | Freq: Every day | ORAL | Status: DC
  Administered 2011-10-23 – 2011-10-24 (×2): 325 mg via ORAL
  Filled 2011-10-22 (×2): qty 1

## 2011-10-22 MED ORDER — SODIUM CHLORIDE 0.9 % IV SOLN: 250.0000 mL | INTRAVENOUS | Status: DC | PRN

## 2011-10-22 MED ORDER — MOVING RIGHT ALONG BOOK
Freq: Once | Status: AC
  Administered 2011-10-22: 12:00:00
  Filled 2011-10-22: qty 1

## 2011-10-22 MED ORDER — DOCUSATE SODIUM 100 MG PO CAPS
200.0000 mg | ORAL_CAPSULE | Freq: Every day | ORAL | Status: DC
  Administered 2011-10-22 – 2011-10-24 (×3): 200 mg via ORAL
  Filled 2011-10-22 (×3): qty 2

## 2011-10-22 MED ORDER — POTASSIUM CHLORIDE CRYS ER 20 MEQ PO TBCR
20.0000 meq | EXTENDED_RELEASE_TABLET | Freq: Once | ORAL | Status: AC
  Administered 2011-10-22: 20 meq via ORAL
  Filled 2011-10-22: qty 1

## 2011-10-22 MED ORDER — BISACODYL 10 MG RE SUPP: 10.0000 mg | Freq: Every day | RECTAL | Status: DC | PRN

## 2011-10-22 MED ORDER — AMIODARONE HCL 200 MG PO TABS
400.0000 mg | ORAL_TABLET | Freq: Two times a day (BID) | ORAL | Status: DC
  Administered 2011-10-22 – 2011-10-24 (×5): 400 mg via ORAL
  Filled 2011-10-22 (×6): qty 2

## 2011-10-22 MED ORDER — SODIUM CHLORIDE 0.9 % IJ SOLN
3.0000 mL | Freq: Two times a day (BID) | INTRAMUSCULAR | Status: DC
  Administered 2011-10-22 – 2011-10-24 (×3): 3 mL via INTRAVENOUS

## 2011-10-22 MED ORDER — SODIUM CHLORIDE 0.9 % IJ SOLN
3.0000 mL | INTRAMUSCULAR | Status: DC | PRN
  Administered 2011-10-23: 3 mL via INTRAVENOUS

## 2011-10-22 MED FILL — Mannitol IV Soln 20%: INTRAVENOUS | Qty: 500 | Status: AC

## 2011-10-22 MED FILL — Sodium Bicarbonate IV Soln 8.4%: INTRAVENOUS | Qty: 50 | Status: AC

## 2011-10-22 MED FILL — Lidocaine HCl IV Inj 20 MG/ML: INTRAVENOUS | Qty: 5 | Status: AC

## 2011-10-22 MED FILL — Heparin Sodium (Porcine) Inj 1000 Unit/ML: INTRAMUSCULAR | Qty: 10 | Status: AC

## 2011-10-22 MED FILL — Sodium Chloride IV Soln 0.9%: INTRAVENOUS | Qty: 1000 | Status: AC

## 2011-10-22 MED FILL — Electrolyte-R (PH 7.4) Solution: INTRAVENOUS | Qty: 3000 | Status: AC

## 2011-10-22 MED FILL — Sodium Chloride Irrigation Soln 0.9%: Qty: 3000 | Status: AC

## 2011-10-22 MED FILL — Heparin Sodium (Porcine) Inj 1000 Unit/ML: INTRAMUSCULAR | Qty: 30 | Status: AC

## 2011-10-22 NOTE — Progress Notes (Signed)
CARDIAC REHAB PHASE I   PRE:  Rate/Rhythm: 68 SR    BP: sitting 120/62    SaO2: 93 RA  MODE:  Ambulation: 390 ft   POST:  Rate/Rhythm: 84    BP: sitting 140/60     SaO2: 94 RA  Tolerated well with RW. Tired after walk. Discussed mechanics of getting OOB and chair. Will f/u. [id number]  Darrick Meigs CES, ACSM

## 2011-10-22 NOTE — Progress Notes (Signed)
3 Days Post-Op Procedure(s) (LRB): CORONARY ARTERY BYPASS GRAFTING (CABG) (N/A) Subjective: No complaints   Objective: Vital signs in last 24 hours: Temp:  [97.5 F (36.4 C)-98.3 F (36.8 C)] 98.1 F (36.7 C) (10/14 0732) Pulse Rate:  [49-131] 67  (10/14 0700) Cardiac Rhythm:  [-] Normal sinus rhythm (10/14 0400) Resp:  [7-29] 16  (10/14 0700) BP: (94-150)/(49-75) 126/56 mmHg (10/14 0700) SpO2:  [91 %-98 %] 93 % (10/14 0700) Weight:  [80.922 kg (178 lb 6.4 oz)] 80.922 kg (178 lb 6.4 oz) (10/14 0500)   Atrial fib/flutter with RVR yesterday, converted with amiodarone.  Hemodynamic parameters for last 24 hours:    Intake/Output from previous day: 10/13 0701 - 10/14 0700 In: 1273.7 [P.O.:300; I.V.:823.7; IV Piggyback:150] Out: 1951 [id number]; Stool:1] Intake/Output this shift:    General appearance: alert and cooperative Heart: regular rate and rhythm, S1, S2 normal, no murmur, click, rub or gallop Lungs: clear to auscultation bilaterally Extremities: extremities normal, atraumatic, no cyanosis or edema Wound: incision ok  Lab Results:  Basename 10/22/11 0400 10/21/11 0450  WBC 10.3 7.8  HGB 10.5* 9.7*  HCT 32.0* 30.2*  PLT 224 150   BMET:  Basename 10/22/11 0400 10/21/11 0450  NA 136 136  [name] 3.5 3.2*  CL 101 104  CO2 19 20  GLUCOSE 111* 113*  BUN 28* 28*  CREATININE 2.13* 2.04*  CALCIUM 8.9 8.3*    PT/INR:  Basename 10/19/11 1300  LABPROT 16.4*  INR 1.35   ABG    Component Value Date/Time   PHART 7.328* 10/19/2011 1924   HCO3 18.0* 10/19/2011 1924   TCO2 19 10/20/2011 1701   ACIDBASEDEF 7.0* 10/19/2011 1924   O2SAT 94.0 10/19/2011 1924   CBG (last 3)   Basename 10/22/11 0729 10/22/11 0357 10/22/11 0019  GLUCAP 105* 99 88    Assessment/Plan: S/P Procedure(s) (LRB): CORONARY ARTERY BYPASS GRAFTING (CABG) (N/A) Stage III CKD:  Creat stable at about baseline. Postop Atrial fib/flutter: converted with IV amio. Will switch to po and continue  BB. Mobilize Plan for transfer to step-down: see transfer orders   LOS: 7 days    [name],[name] 10/22/2011

## 2011-10-23 LAB — BASIC METABOLIC PANEL
CO2: 21 mEq/L (ref 19–32)
Calcium: 9 mg/dL (ref 8.4–10.5)
GFR calc Af Amer: 34 mL/min — ABNORMAL LOW (ref >90)
GFR calc non Af Amer: 29 mL/min — ABNORMAL LOW (ref >90)
Sodium: 135 mEq/L (ref 135–145)

## 2011-10-23 NOTE — Progress Notes (Addendum)
4 Days Post-Op Procedure(s) (LRB): CORONARY ARTERY BYPASS GRAFTING (CABG) (N/A) Subjective:  Mr. Dempewolf has no complaints this morning.  He states he has been able to cough up some clear sputum.  +Ambulation, +BM  Objective: Vital signs in last 24 hours: Temp:  [98.1 F (36.7 C)] 98.1 F (36.7 C) (10/14 1949) Pulse Rate:  [id number]  (10/14 1949) Cardiac Rhythm:  [-] Normal sinus rhythm (10/14 1950) Resp:  [18-25] 20  (10/14 1949) BP: (121-145)/(58-69) 121/58 mmHg (10/14 1949) SpO2:  [92 %-96 %] 95 % (10/14 1949)   Intake/Output from previous day: 10/14 0701 - 10/15 0700 In: 350.1 [P.O.:240; I.V.:110.1] Out: -   General appearance: alert, cooperative and no distress Heart: regular rate and rhythm Lungs: clear to auscultation bilaterally Abdomen: soft, non-tender; bowel sounds normal; no masses,  no organomegaly Extremities: edema trace Wound: clean and dry  Lab Results:  Basename 10/22/11 0400 10/21/11 0450  WBC 10.3 7.8  HGB 10.5* 9.7*  HCT 32.0* 30.2*  PLT 224 150   BMET:  Basename 10/23/11 0625 10/22/11 0400  NA 135 136  K 3.8 3.5  CL 102 101  CO2 21 19  GLUCOSE 108* 111*  BUN 28* 28*  CREATININE 2.01* 2.13*  CALCIUM 9.0 8.9    PT/INR: No results found for this basename: LABPROT,INR in the last 72 hours ABG    Component Value Date/Time   PHART 7.328* 10/19/2011 1924   HCO3 18.0* 10/19/2011 1924   TCO2 19 10/20/2011 1701   ACIDBASEDEF 7.0* 10/19/2011 1924   O2SAT 94.0 10/19/2011 1924   CBG (last 3)   Basename 10/22/11 0729 10/22/11 0357 10/22/11 0019  GLUCAP 105* 99 88    Assessment/Plan: S/P Procedure(s) (LRB): CORONARY ARTERY BYPASS GRAFTING (CABG) (N/A)  1. CV- Previous Atrial Fibrillation, currently NSR on Amiodarone and Lopressor with good rate and pressure control 2. Pulm- continue IS, no acute issues 3. CKD- creatinine at baseline 4. Dispo- patient maintaining NSR, will plan to d/c EPW in AM if maintains NSR hopefully d/c home in 48  hours   LOS: 8 days    Ahmed Prima, ERIN 10/23/2011    Chart reviewed, patient examined, agree with above. He has had no further arrhythmias. He can go home tomorrow if no changes.

## 2011-10-23 NOTE — Care Management Note (Unsigned)
    Page 1 of 1   10/23/2011     3:30:29 PM   CARE MANAGEMENT NOTE 10/23/2011  Patient:  MOHAMAD, WESTGATE   Account Number:  [id number]  Date Initiated:  10/23/2011  Documentation initiated by:  Lido Maske  Subjective/Objective Assessment:   PT S/P CABG X 3 ON 10/19/11.  PTA, PT INDEPENDENT, LIVES WITH SPOUSE.     Action/Plan:   WIFE TO PROVIDE CARE AT DISCHARGE.  PT PLANS TO GET RW FROM HIS SISTER.  WILL FOLLOW FOR HOME NEEDS AS PT PROGRESSES.   Anticipated DC Date:  10/24/2011   Anticipated DC Plan:  [place]  CM consult      Choice offered to / List presented to:             Status of service:  In process, will continue to follow Medicare Important Message given?   (If response is "NO", the following Medicare IM given date fields will be blank) Date Medicare IM given:   Date Additional Medicare IM given:    Discharge Disposition:    Per UR Regulation:  Reviewed for med. necessity/level of care/duration of stay  If discussed at [place] of Stay Meetings, dates discussed:    Comments:

## 2011-10-23 NOTE — Progress Notes (Signed)
CARDIAC REHAB PHASE I   PRE:  Rate/Rhythm: 72 SR    BP: sitting 94/40    SaO2:   MODE:  Ambulation: 550 ft   POST:  Rate/Rhythm: 84 SR    BP: sitting 110/40     SaO2:   Tolerated well. No major c/o. Getting RW from sister. Can walk with wife this pm with RW. Pt requests his name be sent to [facility]  Darrick Meigs CES, ACSM

## 2011-10-23 NOTE — Progress Notes (Signed)
Patient ambulated approximately 400 ft with rolling walker and RN, tolerated well, steady on his feet.  No stops for rest, no SOB.  Left resting comfortably in bed with wife at bedside and call bell within reach.  Will continue to monitor.  Randell Patient

## 2011-10-24 DIAGNOSIS — Z951 Presence of aortocoronary bypass graft: Secondary | ICD-10-CM | POA: Insufficient documentation

## 2011-10-24 HISTORY — DX: Presence of aortocoronary bypass graft: Z95.1

## 2011-10-24 MED ORDER — OXYCODONE HCL 5 MG PO TABS: 5.0000 mg | ORAL_TABLET | ORAL | Status: DC | PRN

## 2011-10-24 MED ORDER — AMIODARONE HCL 400 MG PO TABS: 400.0000 mg | ORAL_TABLET | Freq: Two times a day (BID) | ORAL | Status: DC

## 2011-10-24 MED ORDER — METOPROLOL TARTRATE 25 MG PO TABS: 25.0000 mg | ORAL_TABLET | Freq: Two times a day (BID) | ORAL | Status: DC

## 2011-10-24 NOTE — Progress Notes (Signed)
D/C pt home with instructions, r/x, and f/u appointments. Pt and wife verbalized understanding of instructions, pt home with wife, escorted out by volunteer.

## 2011-10-24 NOTE — Progress Notes (Signed)
[id number] Cardiac Rehab Completed discharge education with pt and wife.They voice understanding. Put recovery from [facility] video on for pt and wife to watch.

## 2011-10-24 NOTE — Progress Notes (Signed)
D/C EPW & CTS per protocol and as ordered, slight resistance met when pulling wires, pt tolerated well.  No ectopy noted, small drop of blood from ventricle site, no tissue noted on wires. Steris applied to suture sites. Pt reminded to lie supine approximately one hour.  INR 1.35, VSS. Will continue to monitor closely.

## 2011-10-24 NOTE — Progress Notes (Addendum)
5 Days Post-Op Procedure(s) (LRB): CORONARY ARTERY BYPASS GRAFTING (CABG) (N/A) Subjective:  [name] has no complaints this morning.  He feels up to being discharged today. +BM  Objective: Vital signs in last 24 hours: Temp:  [98 F (36.7 C)-98.8 F (37.1 C)] 98.3 F (36.8 C) (10/16 0534) Pulse Rate:  [68-72] 68  (10/16 0534) Cardiac Rhythm:  [-] Normal sinus rhythm (10/15 2048) Resp:  [18] 18  (10/16 0534) BP: (99-117)/(45-55) 117/55 mmHg (10/16 0534) SpO2:  [92 %-93 %] 93 % (10/16 0534) Weight:  [175 lb 4.3 oz (79.5 kg)] 175 lb 4.3 oz (79.5 kg) (10/15 1041)    Intake/Output from previous day: 10/15 0701 - 10/16 0700 In: 363 [P.O.:360; I.V.:3] Out: -   General appearance: alert, cooperative and no distress Neurologic: intact Heart: regular rate and rhythm Lungs: clear to auscultation bilaterally Abdomen: soft, non-tender; bowel sounds normal; no masses,  no organomegaly Extremities: extremities normal, atraumatic, no cyanosis or edema Wound: clean and dry  Lab Results:  Basename 10/22/11 0400  WBC 10.3  HGB 10.5*  HCT 32.0*  PLT 224   BMET:  Basename 10/23/11 0625 10/22/11 0400  NA 135 136  K 3.8 3.5  CL 102 101  CO2 21 19  GLUCOSE 108* 111*  BUN 28* 28*  CREATININE 2.01* 2.13*  CALCIUM 9.0 8.9    PT/INR: No results found for this basename: LABPROT,INR in the last 72 hours ABG    Component Value Date/Time   PHART 7.328* 10/19/2011 1924   HCO3 18.0* 10/19/2011 1924   TCO2 19 10/20/2011 1701   ACIDBASEDEF 7.0* 10/19/2011 1924   O2SAT 94.0 10/19/2011 1924   CBG (last 3)   Basename 10/22/11 0729 10/22/11 0357 10/22/11 0019  GLUCAP 105* 99 88    Assessment/Plan: S/P Procedure(s) (LRB): CORONARY ARTERY BYPASS GRAFTING (CABG) (N/A)  1. CV- previous A. Fib, NSR currently continue Amiodarone and Lopressor 2. Pulm- encouraged continued IS use 3. CKD- stable, creatinine at baseline 4. Dispo- patient doing well, will d/c EPW d/c home this  afternoon   LOS: 9 days    [name], [name] 10/24/2011   Chart reviewed, patient examined, agree with above.  He looks good. Will send home today. Follow up with Dr. Dannielle Burn for cardiology.

## 2011-10-24 NOTE — Discharge Summary (Signed)
Physician Discharge Summary  Patient ID: [name] MRN: [id number] DOB/AGE: 1926-06-20 78 y.o.  Admit date: 10/15/2011 Discharge date: 10/24/2011  Admission Diagnoses:  Patient Active Problem List  Diagnosis  . LIPOMA OF UNSPECIFIED SITE  . B12 DEFICIENCY  . PURE HYPERCHOLESTEROLEMIA  . PERSISTENT DISORDER INITIATING/MAINTAINING SLEEP  . PERIPHERAL NEUROPATHY, FEET  . EYELID RETRACTION OR LAG  . HYPERTENSION  . DYSPEPSIA  . JOINT EFFUSION, LEFT KNEE  . SHOULDER PAIN, RIGHT, CHRONIC  . DEGENERATIVE DISC DISEASE, CERVICAL SPINE, W/RADICULOPATHY  . DIZZINESS  . Chronic cough  . PROSTATE CANCER, HX OF  . Venous insufficiency of leg  . Pulmonary nodule  . CAD (coronary artery disease)  . Renal insufficiency   Discharge Diagnoses:   Patient Active Problem List  Diagnosis  . LIPOMA OF UNSPECIFIED SITE  . B12 DEFICIENCY  . PURE HYPERCHOLESTEROLEMIA  . PERSISTENT DISORDER INITIATING/MAINTAINING SLEEP  . PERIPHERAL NEUROPATHY, FEET  . EYELID RETRACTION OR LAG  . HYPERTENSION  . DYSPEPSIA  . JOINT EFFUSION, LEFT KNEE  . SHOULDER PAIN, RIGHT, CHRONIC  . DEGENERATIVE DISC DISEASE, CERVICAL SPINE, W/RADICULOPATHY  . DIZZINESS  . Chronic cough  . PROSTATE CANCER, HX OF  . Venous insufficiency of leg  . Pulmonary nodule  . CAD (coronary artery disease)  . Renal insufficiency  . S/P CABG x 3   Discharged Condition: good  History of Present Illness:   [name] is a 78 yo white male with known history of CAD and CKD.  He is S/P PCI with stent placement to the mid RCA and PDA in 2001.  The patient had been doing well since then.  He had a repeat catheterization in 2009 which showed patent stents and no reconstructable disease.  He also underwent a stress test in 2010 which showed no evidence of ischemia.  However, over the past year the patient has developed worsening dyspnea with exertion.  He presented to the Emergency Department on Monday 10/15/2011 after  experiencing intermittent chest pain and shortness of breath over the weekend.  Workup in the Emergency Department confirmed the presence of a NSTEMI and the patient was admitted for cardiac catheterization.  Hospital Course:   Cardiac Catheterization was done and revealed severe 3 vessel CAD with a preserved EF of 55%.  Due to the severe progression of the disease since 2009 it was felt the patient would benefit from possible coronary bypass procedure.  Cardiothoracic surgery was consulted.  The patient was evaluated by Dr. Cyndia Bent who felt the patient would be a good candidate for coronary bypass procedure.  The risks and benefits of the procedure were explained to the patient and he was agreeable to proceed with surgery. HD #2 patient with continue complaints of dyspnea.  HD #3 the patient was taken to the operating room.  He underwent CABG x3 utilizing LIMA to LAD, SVG to PDA, and SVG to OM1.  He also underwent endoscopic saphenous vein harvest of his right leg.  The patient tolerated the procedure well and was taken to the ICU in stable condition.  POD #0 patient was weaned off all cardiac support medications.  He was extubated.  POD #1 patient was doing well.  His arterial lines and chest tubes were removed.  POD #2 patient developed rapid Atrial Fibrillation.  He was treated with IV Amiodarone and his Lopressor was increased.  He was also supplemented for hypokalemia.  POD #3 patient was maintaining NSR.  He was placed on oral Amiodarone.  He was medically  stable and transferred to the step down unit in stable condition.  POD #5 the patient is doing very well.  He remains in NSR.  We will remove his external pacing wires.  Should no arrythmia develop he will be discharged home today.  He will follow up with Dr. Cyndia Bent in 3 weeks with a chest xray.  He will also need to follow up with Dr. Lutricia Feil in 2 weeks.   Significant Diagnostic Studies:   Cardiac Catheterization  Hemodynamics:  AO 88/49 with a  mean of 69 mmHg  LV 88/5 mmHg  Coronary angiography:  Coronary dominance: right  Left mainstem: There is mild tapering of the distal left main of 20%. The left coronary that trifurcates into the LAD, intermediate, and circumflex vessels.  Left anterior descending (LAD): The LAD has moderate diffuse disease in the proximal vessel up to 70%. The mid to distal LAD is diffusely and severely diseased up to 90%. This is a long segment of disease. The first diagonal has a 50% disease proximally.  The ramus intermediate branch is moderate in size and without significant disease.  Left circumflex (LCx): The left circumflex coronary has a 7080% stenosis proximally. The circumflex gives rise to 2 marginal branches.  Right coronary artery (RCA): The right coronary is a dominant vessel. The proximal vessel there is only mild disease up to 30%. The stent in the mid vessel is widely patent. Following the stent however there is a long segment of disease of the proximal up to 99%. There is TIMI grade 2 flow distally. Prior to the bifurcation of the PDA and posterior lateral branches there is a 60% stenosis. The stent in the PDA is widely patent. The posterior lateral branch of the right coronary has an 80% stenosis.  ECHO:   - Left ventricle: The cavity size was normal. Wall thickness was normal. Systolic function was normal. The estimated ejection fraction was in the range of 50% to 55%. There was an increased relative contribution of atrial contraction to ventricular filling. - Left atrium: The atrium was mildly dilated.  Treatments: surgery:   Median sternotomy, extracorporeal circulation,  coronary artery bypass graft surgery x3 using a left internal mammary  artery graft to left anterior descending coronary artery, with a  saphenous vein graft to the obtuse marginal branch of left circumflex  coronary artery, and a saphenous vein graft to the posterior descending  branch of the right coronary artery.  Endoscopic vein harvesting from  the right leg.  Disposition: HOME  Discharge Orders    Future Appointments: Provider: Department: Dept Phone: Center:   11/13/2011 2:00 PM [facility] Nurse [facility] [phone] [facility]       Medication List     As of 10/24/2011  8:24 AM    STOP taking these medications         amLODipine 10 MG tablet   Commonly known as: NORVASC      nitroGLYCERIN 0.4 MG SL tablet   Commonly known as: NITROSTAT      TAKE these medications         amiodarone 400 MG tablet   Commonly known as: PACERONE   Take 1 tablet (400 mg total) by mouth 2 (two) times daily.      aspirin 325 MG buffered tablet   Take 325 mg by mouth daily.      clotrimazole-betamethasone cream   Commonly known as: LOTRISONE   Apply 1 application topically 2 (two) times daily as needed.  CYANOCOBALAMIN IJ   Inject 1 mL as directed every 30 (thirty) days.      ergocalciferol 50000 UNITS capsule   Commonly known as: VITAMIN D2   Take 50,000 Units by mouth every 30 (thirty) days.      fluticasone 50 MCG/ACT nasal spray   Commonly known as: FLONASE   Place 1 spray into the nose 2 (two) times daily as needed. For cold symptoms      folic acid 1 MG tablet   Commonly known as: FOLVITE   Take 1 tablet (1 mg total) by mouth daily.      gemfibrozil 600 MG tablet   Commonly known as: LOPID   Take 1 tablet (600 mg total) by mouth 2 (two) times daily before a meal.      metoprolol tartrate 25 MG tablet   Commonly known as: LOPRESSOR   Take 1 tablet (25 mg total) by mouth 2 (two) times daily.      oxyCODONE 5 MG immediate release tablet   Commonly known as: Oxy IR/ROXICODONE   Take 1-2 tablets (5-10 mg total) by mouth every 3 (three) hours as needed.      potassium chloride 10 MEQ tablet   Commonly known as: K-DUR   Take 1 tablet (10 mEq total) by mouth daily.      simvastatin 20 MG tablet   Commonly known as: ZOCOR   Take 1 tablet (20 mg total) by mouth at bedtime.        The patient has been discharged on:   1.Beta Blocker:  Yes [ x  ]                              No   [   ]                              If No, reason:  2.Ace Inhibitor/ARB: Yes [   ]                                     No  [ x   ]                                     If No, reason: Preserved EF and CKD with elevated creatinine  3.Statin:   Yes [x   ]                  No  [   ]                  If No, reason:  4.Ecasa:  Yes  [ x  ]                  No   [   ]                  If No, reason:       Follow-up Information    Follow up with Gaye Pollack, MD. In 3 weeks.   Contact information:   [address] [place] [address] [phone]       Follow up with [facility]. In 3 weeks.   Contact information:   [name] [place] [address]       Follow up with Peter Martinique, MD. In 2 weeks. (Please contact office to set up appointment)    Contact information:   [address] [place] [address] [phone]          Signed: Ellwood Handler 10/24/2011, 8:24 AM

## 2011-10-26 ENCOUNTER — Telehealth: Payer: Self-pay | Admitting: *Deleted

## 2011-10-26 NOTE — Telephone Encounter (Signed)
Mrs, Millison called yesterday pm with concerns of her husband's bruising in his thigh and into the hip. She said she had not noticed this while he was in the hospital.  It is very purple in color but there is no swelling or pain.  He did have a small incision in his groin.  I reassured her that this often happens and that with more activity it may extend more into the leg.  I told her to mark the perimeter with a pen and I would call her today to see if there was any extension, which I did.  There has been none.  She was again reassured.

## 2011-11-06 ENCOUNTER — Other Ambulatory Visit: Payer: Self-pay | Admitting: Internal Medicine

## 2011-11-06 ENCOUNTER — Other Ambulatory Visit: Payer: Self-pay | Admitting: *Deleted

## 2011-11-06 ENCOUNTER — Encounter: Payer: Self-pay | Admitting: Internal Medicine

## 2011-11-06 ENCOUNTER — Encounter (INDEPENDENT_AMBULATORY_CARE_PROVIDER_SITE_OTHER): Payer: Medicare Other

## 2011-11-06 ENCOUNTER — Telehealth: Payer: Self-pay | Admitting: Internal Medicine

## 2011-11-06 ENCOUNTER — Ambulatory Visit (INDEPENDENT_AMBULATORY_CARE_PROVIDER_SITE_OTHER): Payer: Medicare Other | Admitting: Internal Medicine

## 2011-11-06 VITALS — BP 108/62 | HR 54 | Temp 97.3°F | Ht 72.0 in | Wt 176.0 lb

## 2011-11-06 DIAGNOSIS — M79609 Pain in unspecified limb: Secondary | ICD-10-CM

## 2011-11-06 DIAGNOSIS — L0390 Cellulitis, unspecified: Secondary | ICD-10-CM

## 2011-11-06 DIAGNOSIS — M79604 Pain in right leg: Secondary | ICD-10-CM

## 2011-11-06 DIAGNOSIS — M7989 Other specified soft tissue disorders: Secondary | ICD-10-CM

## 2011-11-06 DIAGNOSIS — R1031 Right lower quadrant pain: Secondary | ICD-10-CM

## 2011-11-06 NOTE — Telephone Encounter (Signed)
Caller: [name]/Patient; Patient Name: [name]; PCP: Adella Hare (Adults only); Best Callback Phone Number: [phone].  Patient calling about "2 lumps noted where they took 2 veins out for my bypass."  States spoke with cardiologist, who suggested Dr. Linda Hedges order a doppler study.  One is located at the back of the right knee, and the other is up near the right groin.  Popliteal one is bothersome and tender to the patient. Had bypass 10/19/11 and was discharged from hospital 10/24/11.  States it is tender, and leg is more swollen than the other leg.  Per leg pain protocol, advised appt within 4 hours; appt scheduled 1515 11/06/11 with Ms. Garnette Gunner.

## 2011-11-06 NOTE — Progress Notes (Signed)
Subjective:    Patient ID: [name], male    DOB: Jul 14, 1926, 78 y.o.   MRN: [id number]  HPI  Pt presents to the clinic today for for painful lump in the popliteal area behind the right knee as well as the right groin. Pt was recently discharged from the hospital (10/24/2011) s/p 3 vessel CABG with saphenous vein grafts from the right leg. Pt states the pain started about 3 days ago with the right calf swelling 2-3x greater than the left calf. Pt has noted some mild erythema. Pt called his cardiologist Dr. Lutricia Feil with this concern, and he advised him to be seen by his PCP. Pt is accompanied by his wife.  Review of Systems      Past Medical History  Diagnosis Date  . Prostate cancer     a. 2001 s/p TURP.  [name] Hypertension     well-controlled.  [name] CAD (coronary artery disease)     a. S/P stenting to mid RCA, prox PDA 06/1999;  06/2007 Myoview: negative except for apical thinning, EF 68%, last Myoview 08/2008 w/o significant ischemia  . CKD (chronic kidney disease), stage III     a. stable with a creatinine around 1.9-2.0 followed by nephrology.  . Dyslipidemia   . Neck injury     a. C3-C4 and C4-C5 foraminal narrowing, severe  . Renal artery stenosis     a. 50% by cath 2001  . Pulmonary nodule     a. felt to be noncancerous.  Status post followup CT scan 4 mm and stable.  . Chronic UTI     a. Followed by Dr. Risa Grill - colonized/asymptomatic - not on abx    Current Outpatient Prescriptions  Medication Sig Dispense Refill  . amiodarone (PACERONE) 400 MG tablet Take 1 tablet (400 mg total) by mouth 2 (two) times daily.  60 tablet  1  . aspirin 325 MG buffered tablet Take 325 mg by mouth daily.        . clotrimazole-betamethasone (LOTRISONE) cream Apply 1 application topically 2 (two) times daily as needed.  60 g  3  . CYANOCOBALAMIN IJ Inject 1 mL as directed every 30 (thirty) days.       . ergocalciferol (VITAMIN D2) 50000 UNITS capsule Take 50,000 Units by mouth every 30  (thirty) days.        . fluticasone (FLONASE) 50 MCG/ACT nasal spray Place 1 spray into the nose 2 (two) times daily as needed. For cold symptoms      . folic acid (FOLVITE) 1 MG tablet Take 1 tablet (1 mg total) by mouth daily.  90 tablet  3  . gemfibrozil (LOPID) 600 MG tablet Take 1 tablet (600 mg total) by mouth 2 (two) times daily before a meal.  180 tablet  3  . metoprolol tartrate (LOPRESSOR) 25 MG tablet Take 1 tablet (25 mg total) by mouth 2 (two) times daily.  60 tablet  1  . oxyCODONE (OXY IR/ROXICODONE) 5 MG immediate release tablet Take 1-2 tablets (5-10 mg total) by mouth every 3 (three) hours as needed.  30 tablet  0  . potassium chloride (K-DUR) 10 MEQ tablet Take 1 tablet (10 mEq total) by mouth daily.  90 tablet  3  . simvastatin (ZOCOR) 20 MG tablet Take 1 tablet (20 mg total) by mouth at bedtime.  90 tablet  3    Allergies  Allergen Reactions  . Amoxicillin Nausea Only  . Aspirin     REACTION: upset stomach; tolerates  coated aspirin  . Atarax (Hydroxyzine Hcl)   . Ciprofloxacin Nausea Only  . Codeine     REACTION: Stomach upset  . Hydrocodone Nausea Only  . Macrobid (Nitrofurantoin Macrocrystal) Nausea Only  . Niacin   . Niacin-Lovastatin Er     Unsure of reaction. Taking simvastatin at home without problems  . Nitrofurantoin   . Trimox (Amoxicillin Trihydrate)     Family History  Problem Relation Age of Onset  . Coronary artery disease Father     died @ 55  . Cancer Brother     Bladder  . Diabetes Neg Hx   . Prostate cancer Brother   . Nephritis Brother     died @ age 61.  . Other Brother     cerebral hemorrhage - died @ 51  . Other Mother     cerebral hemorrhage - died @ 56  . Heart disease Brother   . Arthritis Mother   . Breast cancer Other     niece x 2   Constitutional: Denies fever, malaise, fatigue, headache or abrupt weight changes.  Respiratory: Denies difficulty breathing, shortness of breath, cough or sputum production.   Cardiovascular:  Denies chest pain, chest tightness, palpitations or swelling in the hands or feet. .  Musculoskeletal: Pt reports right knee pain and right groin pain, with moderate swelling and mild erythema. Pt does report decrease ROM in the right knee and hip due to recent surgery. Denies muscle pain, joint pain or difficulty with gait. Skin: Pt reports mild redness near the incision on the side of his knee and in his groin area. Denies rashes, lesions or ulcercations.  Neurological: Denies dizziness, difficulty with memory, difficulty with speech or problems with balance and coordination.   No other specific complaints in a complete review of systems (except as listed in HPI above).  Objective:   Physical Exam   General: Appears his stated age, well developed, well nourished in NAD.  Cardiovascular: Normal rate and rhythm. S1,S2 noted.  No murmur, rubs or gallops noted. No JVD or BLE edema. No carotid bruits noted. Pulmonary/Chest: Normal effort and positive vesicular breath sounds. No respiratory distress. No wheezes, rales or ronchi noted.  Musculoskeletal: Decreased active range of motion of the right knee. 2+ non pitting edema of the medial aspect of the knee. 2+ non pitting edema of the medial aspect of the thigh.. No difficulty with gait.  Skin: Mild erythema and cellulitis along the medial aspect of the thigh surrounding a hard lump that extends approximately 3 inches distally. Another hard lump is noted on the medial aspect of the knee that extends approximately 2 inches proximally. Area is tender to palpation. Neurological: Alert and oriented. Cranial nerves II-XII intact. Coordination normal.  Psychiatric: Mood and affect normal. Behavior is normal. Judgment and thought content normal.          Assessment & Plan:   Right Lower Extremity Pain Right Lower Extremity Swelling Possible Superficial Venous Thrombus  Will obtain RLE ultrasound to rule out DVT. Take tylenol or oxycodone as  needed for pain management.   Follow up with Dr. Lutricia Feil at your next scheduled appt. Follow up with Dr. Crissie Sickles tomorrow at 11:15

## 2011-11-06 NOTE — Patient Instructions (Addendum)
Vascular Ultrasound Vascular ultrasound is a test to see if you have blood flow problems or clots in your veins. Ultrasound uses harmless sound waves to take pictures of the inside of your body. A device (transducer) is held up against your body to capture these pictures. The continually changing images can be recorded on videotape or film. Diagnostic ultrasound imaging may also be called sonography or ultrasonography. There are different types of vascular ultrasound exams. An aortic ultrasound can show enlargement of the artery (aneurysm) or masses. A carotid ultrasound can show if there is blockage (atherosclerosis) of the large arteries in the neck that supply most of the blood to the brain. Vascular ultrasound may be used almost anywhere throughout the body. Some of the most common sites where it is used are in the neck, heart, abdomen, and legs. There are several types of ultrasound, including:  Continuous wave Doppler ultrasound. This type of ultrasound uses the change in pitch of the sound waves to provide information about blood flow through a blood vessel. Your caregiver listens to the sounds produced by the transducer.   Duplex ultrasound. This type of ultrasound uses standard ultrasound methods to produce a picture of a blood vessel and surrounding organs. In addition, a computer provides added information about the speed and direction of blood flow through the blood vessel. With this type of ultrasound it is possible to see the structures inside the body and to evaluate blood flow within those structures at the same time. Blood flow in individual blood vessels is most commonly evaluated by duplex ultrasound.   Color Doppler ultrasound. This type of ultrasound uses standard ultrasound methods to produce a picture of a blood vessel. In addition, a computer converts the Doppler sounds into colors that are overlaid on the image of the blood vessel. These colors represent the speed and direction  of blood flow through the vessel.   Power Doppler ultrasound. This type of ultrasound is up to 5 times more sensitive than color Doppler ultrasound. Power Doppler ultrasound can also get images that are difficult or impossible to get using standard color Doppler ultrasound. Power Doppler ultrasound is used most commonly to evaluate blood flow through vessels within organs, such as the liver or kidneys.  RISKS AND COMPLICATIONS Ultrasound has been used for many years and has never shown any harmful effects. Studies in humans have shown no direct link between the use of ultrasound and future problems from the ultrasound. BEFORE THE PROCEDURE For most Doppler ultrasound exams, no preparation is necessary. Your caregiver may ask you not to eat the morning of your exam if the ultrasound scan involves your upper abdomen. PROCEDURE There is no pain in an ultrasound exam. A gel is applied to your skin and the transducer is then placed on the area to be examined. The gel may feel cool. The gel wipes off easily, but it is a good idea to wear clothing that is easily washable. The images from inside your body are displayed on one or more monitors, which look like small television screens. The room is usually darkened during the exam. This makes it easier to see the images on the monitor. The ultrasound exam should take 30 to 60 minutes. The length of the exam depends on many things including the portion of your body to be examined and the complexity of your body's anatomy. An ultrasound that looks at hardening of the arteries (arteriosclerosis) may take more scanning time. AFTER THE PROCEDURE You can safely  drive home and return to regular activities immediately after your exam. In many cases, follow-up exams are necessary to check on your condition or your response to therapy. Ask when your test results will be ready. Make sure you get your test results. Document Released: 01/06/2004 Document Revised: 03/19/2011  Document Reviewed: 06/30/2010 [facility] Patient Information 2013 [place].

## 2011-11-07 ENCOUNTER — Ambulatory Visit (INDEPENDENT_AMBULATORY_CARE_PROVIDER_SITE_OTHER): Payer: Medicare Other | Admitting: Internal Medicine

## 2011-11-07 ENCOUNTER — Encounter: Payer: Self-pay | Admitting: Internal Medicine

## 2011-11-07 VITALS — BP 96/52 | HR 50 | Temp 98.3°F | Resp 14 | Wt 176.0 lb

## 2011-11-07 DIAGNOSIS — I8290 Acute embolism and thrombosis of unspecified vein: Secondary | ICD-10-CM

## 2011-11-07 DIAGNOSIS — I749 Embolism and thrombosis of unspecified artery: Secondary | ICD-10-CM

## 2011-11-08 NOTE — Progress Notes (Signed)
Subjective:    Patient ID: [name], male    DOB: [birth year], 78 y.o.   MRN: [id number]  HPI [name] had recent CABG. He was seen at home by Dr. Dannielle Burn and there was a concern about some swelling and pain in the right leg from which he had endoscopic vein harvesting. He was seen Oct 29 by Ms. Baity - LE venous doppler was ordered. Study was negative for DVT, positive for superficial vein thrombosis. Today the swelling and pain is better.  He is making a very good recovery from CABG. He is still fatigued but does not have pain and is increasing his activity. He is in good spirits.  Past Medical History  Diagnosis Date  . Prostate cancer     a. 2001 s/p TURP.  [name] Hypertension     well-controlled.  [name] CAD (coronary artery disease)     a. S/P stenting to mid RCA, prox PDA 06/1999;  06/2007 Myoview: negative except for apical thinning, EF 68%, last Myoview 08/2008 w/o significant ischemia  . CKD (chronic kidney disease), stage III     a. stable with a creatinine around 1.9-2.0 followed by nephrology.  . Dyslipidemia   . Neck injury     a. C3-C4 and C4-C5 foraminal narrowing, severe  . Renal artery stenosis     a. 50% by cath 2001  . Pulmonary nodule     a. felt to be noncancerous.  Status post followup CT scan 4 mm and stable.  . Chronic UTI     a. Followed by Dr. Risa Grill - colonized/asymptomatic - not on abx   Past Surgical History  Procedure Date  . S/p ptca   . Cardia catherization 07-07-99  . Peripheral vascular catherization 11-24-03  . Renal circulation 10-01-03  . Stress cardiolite 05-04-05    spring 09-negative except for apical thinning, EF 68%  . Edg 07-17-1994  . Flexible sigmoidoscopy 11-03-1997  . Prostatectomy   . Lumbar spinal disk and neck fusion surgery   . Stents     X 2  . Coronary artery bypass graft 10/19/2011    Procedure: CORONARY ARTERY BYPASS GRAFTING (CABG);  Surgeon: Gaye Pollack, MD;  Location: [place];  Service: Open Heart Surgery;   Laterality: N/A;  times three using Left Internal Mammary Artery and Right Greater Saphenouse Vein Graft harvested Endoscopically   Family History  Problem Relation Age of Onset  . Coronary artery disease Father     died @ 30  . Cancer Brother     Bladder  . Diabetes Neg Hx   . Prostate cancer Brother   . Nephritis Brother     died @ age 2.  . Other Brother     cerebral hemorrhage - died @ 53  . Other Mother     cerebral hemorrhage - died @ 11  . Heart disease Brother   . Arthritis Mother   . Breast cancer Other     niece x 2   History   Social History  . Marital Status: Married    Spouse Name: N/A    Number of Children: 2  . Years of Education: N/A   Occupational History  . 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     Social History Main Topics  . Smoking status: Never Smoker   . Smokeless tobacco: Never Used  .  Alcohol Use: Yes     Rarely  . Drug Use: No  . Sexually Active: Not on file   Other Topics Concern  . Not on file   Social History Narrative   HSG, 1 year college.  married '52 - 3 years, divorced; married '56 - 3 years divorced; married '12-12 yrs - divorced; married '75 -. 1 son '57; 1 daughter - '53; 1 grandchild.  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  Very happily married.  End of life care: yes CPR, no long term mechanical ventilation, no heroic measures.    Current Outpatient Prescriptions on File Prior to Visit  Medication Sig Dispense Refill  . amiodarone (PACERONE) 400 MG tablet Take 1 tablet (400 mg total) by mouth 2 (two) times daily.  60 tablet  1  . aspirin 325 MG buffered tablet Take 325 mg by mouth daily.        . clotrimazole-betamethasone (LOTRISONE) cream Apply 1 application topically 2 (two) times daily as needed.  60 g  3  . CYANOCOBALAMIN IJ Inject 1 mL as directed every 30 (thirty) days.       .  ergocalciferol (VITAMIN D2) 50000 UNITS capsule Take 50,000 Units by mouth every 30 (thirty) days.        . fluticasone (FLONASE) 50 MCG/ACT nasal spray Place 1 spray into the nose 2 (two) times daily as needed. For cold symptoms      . folic acid (FOLVITE) 1 MG tablet Take 1 tablet (1 mg total) by mouth daily.  90 tablet  3  . gemfibrozil (LOPID) 600 MG tablet Take 1 tablet (600 mg total) by mouth 2 (two) times daily before a meal.  180 tablet  3  . metoprolol tartrate (LOPRESSOR) 25 MG tablet Take 1 tablet (25 mg total) by mouth 2 (two) times daily.  60 tablet  1  . oxyCODONE (OXY IR/ROXICODONE) 5 MG immediate release tablet Take 1-2 tablets (5-10 mg total) by mouth every 3 (three) hours as needed.  30 tablet  0  . potassium chloride (K-DUR) 10 MEQ tablet Take 1 tablet (10 mEq total) by mouth daily.  90 tablet  3  . simvastatin (ZOCOR) 20 MG tablet Take 1 tablet (20 mg total) by mouth at bedtime.  90 tablet  3      Review of Systems System review is negative for any constitutional, cardiac, pulmonary, GI or neuro symptoms or complaints other than as described in the HPI.     Objective:   Physical Exam Filed Vitals:   11/07/11 1118  BP: 96/52  Pulse: 50  Temp: 98.3 F (36.8 C)  Resp: 14   Wt Readings from Last 3 Encounters:  11/07/11 176 lb (79.833 kg)  11/06/11 176 lb (79.833 kg)  10/24/11 175 lb 4.3 oz (79.5 kg)   Gen'l - WNWD white man in no distress Cor- RRR Pulm - normal respirations Ext - right LE - visible vein harvesting scar(s) from endoscopic harvest. No palpable cord, mass and he had no tendernes.       Assessment & Plan:  Superficial vein thrombosis - He is reassured that there is no danger of DVT/PE. There is no evidence of phlebitis  Plan - knee high otc men's hosiery, for proximal discomfort 6" ACE wrap if needed  Watch for signs of phlebitis

## 2011-11-09 ENCOUNTER — Other Ambulatory Visit: Payer: Self-pay | Admitting: Surgery

## 2011-11-09 DIAGNOSIS — Z951 Presence of aortocoronary bypass graft: Secondary | ICD-10-CM

## 2011-11-13 ENCOUNTER — Ambulatory Visit (INDEPENDENT_AMBULATORY_CARE_PROVIDER_SITE_OTHER): Payer: Self-pay | Admitting: Surgery

## 2011-11-13 ENCOUNTER — Encounter: Payer: Self-pay | Admitting: Surgery

## 2011-11-13 ENCOUNTER — Ambulatory Visit (INDEPENDENT_AMBULATORY_CARE_PROVIDER_SITE_OTHER): Payer: Medicare Other | Admitting: *Deleted

## 2011-11-13 ENCOUNTER — Ambulatory Visit
Admission: RE | Admit: 2011-11-13 | Discharge: 2011-11-13 | Disposition: A | Discharge status: Home / Self Care | Payer: Medicare Other | Source: Ambulatory Visit | Attending: Surgery | Admitting: Surgery

## 2011-11-13 VITALS — BP 120/64 | HR 49 | Temp 97.7°F | Resp 18 | Ht 72.0 in | Wt 168.0 lb

## 2011-11-13 DIAGNOSIS — E538 Deficiency of other specified B group vitamins: Secondary | ICD-10-CM

## 2011-11-13 DIAGNOSIS — Z951 Presence of aortocoronary bypass graft: Secondary | ICD-10-CM

## 2011-11-13 DIAGNOSIS — I2510 Atherosclerotic heart disease of native coronary artery without angina pectoris: Secondary | ICD-10-CM

## 2011-11-13 MED ORDER — CYANOCOBALAMIN 1000 MCG/ML IJ SOLN
1000.0000 ug | Freq: Once | INTRAMUSCULAR | Status: AC
  Administered 2011-11-13: 1000 ug via INTRAMUSCULAR

## 2011-11-13 NOTE — Progress Notes (Signed)
[address]            [place] [address]          [phone]      HPI:  Patient returns for routine postoperative follow-up having undergone coronary bypass graft surgery x3 on 10/19/2011. The patient's early postoperative recovery while in the hospital was notable for an uncomplicated postoperative course. Since hospital discharge the patient reports that he still has some exertional dyspnea as well as dyspnea when lying flat in bed. He said that he usually sleeps on his side but has not been able to postoperatively. He has been walking daily without chest discomfort. He is anxious to start cardiac rehabilitation.   Current Outpatient Prescriptions  Medication Sig Dispense Refill  . amiodarone (PACERONE) 400 MG tablet Take 1 tablet (400 mg total) by mouth 2 (two) times daily.  60 tablet  1  . aspirin 325 MG buffered tablet Take 325 mg by mouth daily.        . clotrimazole-betamethasone (LOTRISONE) cream Apply 1 application topically 2 (two) times daily as needed.  60 g  3  . CYANOCOBALAMIN IJ Inject 1 mL as directed every 30 (thirty) days.       . ergocalciferol (VITAMIN D2) 50000 UNITS capsule Take 50,000 Units by mouth every 30 (thirty) days.        . fluticasone (FLONASE) 50 MCG/ACT nasal spray Place 1 spray into the nose 2 (two) times daily as needed. For cold symptoms      . folic acid (FOLVITE) 1 MG tablet Take 1 tablet (1 mg total) by mouth daily.  90 tablet  3  . gemfibrozil (LOPID) 600 MG tablet Take 1 tablet (600 mg total) by mouth 2 (two) times daily before a meal.  180 tablet  3  . metoprolol tartrate (LOPRESSOR) 25 MG tablet Take 1 tablet (25 mg total) by mouth 2 (two) times daily.  60 tablet  1  . oxyCODONE (OXY IR/ROXICODONE) 5 MG immediate release tablet Take 1-2 tablets (5-10 mg total) by mouth every 3 (three) hours as needed.  30 tablet  0  . potassium chloride (K-DUR) 10 MEQ tablet Take 1 tablet (10 mEq total) by mouth daily.  90  tablet  3  . simvastatin (ZOCOR) 20 MG tablet Take 1 tablet (20 mg total) by mouth at bedtime.  90 tablet  3   No current facility-administered medications for this visit.   Facility-Administered Medications Ordered in Other Visits  Medication Dose Route Frequency Provider Last Rate Last Dose  . [COMPLETED] cyanocobalamin ((VITAMIN B-12)) injection 1,000 mcg  1,000 mcg Intramuscular Once Neena Rhymes, MD   1,000 mcg at 11/13/11 1353    Physical Exam: BP 120/64  Pulse 49  Temp 97.7 F (36.5 C) (Oral)  Resp 18  Ht 6' (1.829 m)  Wt 168 lb (76.204 kg)  BMI 22.78 kg/m2  SpO2 98% He still looks a little tired but in no distress. Lung exam reveals slight decreased breath sounds in the bases. Cardiac exam shows regular rate and rhythm with normal heart sounds. Chest incision is healing well and sternum is stable. His right leg incision is healing well and there is no peripheral edema.  Diagnostic Tests:  *RADIOLOGY REPORT*   Clinical Data: Post CABG.   CHEST - 2 VIEW   Comparison: 10/22/2011   Findings: Prior CABG.  Suspect trace bilateral  effusions.  No confluent airspace opacities or acute bony abnormality.  Heart is borderline in size.  No pneumothorax.   IMPRESSION: Suspect trace bilateral effusions.     Original Report Authenticated By: Rolm Baptise, M.D.     Impression:  Overall I think he is making good progress following bypass surgery especially considering he is 78 years old. He was very active preoperatively and motivated to exercise and I think he will continue to make good progress. I think he could return to cardiac rehabilitation at this time. 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  Plan:  He will continue followup with Dr. Dannielle Burn and I'll plan to see him back in about 1 month for followup.

## 2011-11-27 DIAGNOSIS — I2510 Atherosclerotic heart disease of native coronary artery without angina pectoris: Secondary | ICD-10-CM | POA: Insufficient documentation

## 2011-11-27 HISTORY — DX: Atherosclerotic heart disease of native coronary artery without angina pectoris: I25.10

## 2011-12-11 ENCOUNTER — Ambulatory Visit (INDEPENDENT_AMBULATORY_CARE_PROVIDER_SITE_OTHER): Payer: Self-pay | Admitting: Surgery

## 2011-12-11 ENCOUNTER — Encounter: Payer: Self-pay | Admitting: Surgery

## 2011-12-11 VITALS — BP 124/69 | HR 56 | Resp 16 | Ht 72.0 in | Wt 175.0 lb

## 2011-12-11 DIAGNOSIS — Z951 Presence of aortocoronary bypass graft: Secondary | ICD-10-CM

## 2011-12-11 NOTE — Progress Notes (Signed)
                   [address]            [place] [address]          [phone]      HPI:  Patient returns for routine postoperative follow-up having undergone coronary bypass graft surgery x3 on 10/19/2011. The patient's early postoperative recovery while in the hospital was notable for an uncomplicated postoperative course. I saw him in the office a few weeks ago when he was still feeling a little weak and short of breath but he is feeling much better now. He is walking daily without any chest pain or shortness of breath. He is sleeping much better and his appetite is good. He is anxious to start cardiac rehabilitation and is going for orientation next week.    Current Outpatient Prescriptions  Medication Sig Dispense Refill  . aspirin 325 MG buffered tablet Take 325 mg by mouth daily.        . clotrimazole-betamethasone (LOTRISONE) cream Apply 1 application topically 2 (two) times daily as needed.  60 g  3  . CYANOCOBALAMIN IJ Inject 1 mL as directed every 30 (thirty) days.       . ergocalciferol (VITAMIN D2) 50000 UNITS capsule Take 50,000 Units by mouth every 30 (thirty) days.        . fluticasone (FLONASE) 50 MCG/ACT nasal spray Place 1 spray into the nose 2 (two) times daily as needed. For cold symptoms      . folic acid (FOLVITE) 1 MG tablet Take 1 tablet (1 mg total) by mouth daily.  90 tablet  3  . metoprolol tartrate (LOPRESSOR) 25 MG tablet Take 1 tablet (25 mg total) by mouth 2 (two) times daily.  60 tablet  1  . potassium chloride (K-DUR) 10 MEQ tablet Take 1 tablet (10 mEq total) by mouth daily.  90 tablet  3  . simvastatin (ZOCOR) 20 MG tablet Take 1 tablet (20 mg total) by mouth at bedtime.  90 tablet  3     Physical Exam: BP 124/69  Pulse 56  Resp 16  Ht 6' (1.829 m)  Wt 175 lb (79.379 kg)  BMI 23.73 kg/m2  SpO2 98% He looks well. Cardiac exam shows a regular rate and rhythm with normal heart sounds. Lung exam is clear. Chest incision is  healing well and the sternum is stable. His leg incision is healing well and there is no peripheral edema.    Impression:  Overall he is making a good recovery following coronary bypass surgery. 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  Plan:  He will continue followup with Dr. Dannielle Burn and will contact me if he develops any problems with his incisions.

## 2011-12-12 ENCOUNTER — Other Ambulatory Visit: Payer: Self-pay | Admitting: Internal Medicine

## 2011-12-12 DIAGNOSIS — E7800 Pure hypercholesterolemia, unspecified: Secondary | ICD-10-CM

## 2011-12-13 ENCOUNTER — Ambulatory Visit (INDEPENDENT_AMBULATORY_CARE_PROVIDER_SITE_OTHER): Payer: Medicare Other | Admitting: *Deleted

## 2011-12-13 ENCOUNTER — Other Ambulatory Visit (INDEPENDENT_AMBULATORY_CARE_PROVIDER_SITE_OTHER): Payer: Medicare Other

## 2011-12-13 DIAGNOSIS — E538 Deficiency of other specified B group vitamins: Secondary | ICD-10-CM

## 2011-12-13 DIAGNOSIS — E7800 Pure hypercholesterolemia, unspecified: Secondary | ICD-10-CM

## 2011-12-13 LAB — HEPATIC FUNCTION PANEL
ALT: 21 U/L (ref 0–53)
Bilirubin, Direct: 0.2 mg/dL (ref 0.0–0.3)
Total Bilirubin: 0.7 mg/dL (ref 0.3–1.2)

## 2011-12-13 MED ORDER — CYANOCOBALAMIN 1000 MCG/ML IJ SOLN
1000.0000 ug | Freq: Once | INTRAMUSCULAR | Status: AC
  Administered 2011-12-13: 1000 ug via INTRAMUSCULAR

## 2011-12-20 ENCOUNTER — Telehealth: Payer: Self-pay | Admitting: *Deleted

## 2011-12-20 ENCOUNTER — Encounter (HOSPITAL_COMMUNITY)
Admission: RE | Admit: 2011-12-20 | Discharge: 2011-12-20 | Disposition: A | Discharge status: Home / Self Care | Payer: Medicare Other | Source: Ambulatory Visit | Attending: Cardiology | Admitting: Cardiology

## 2011-12-20 ENCOUNTER — Encounter: Payer: Self-pay | Admitting: Internal Medicine

## 2011-12-20 DIAGNOSIS — Z5189 Encounter for other specified aftercare: Secondary | ICD-10-CM | POA: Insufficient documentation

## 2011-12-20 DIAGNOSIS — I214 Non-ST elevation (NSTEMI) myocardial infarction: Secondary | ICD-10-CM | POA: Insufficient documentation

## 2011-12-20 NOTE — Telephone Encounter (Signed)
Faxed labs to Dr. Dannielle Burn at [phone] per Dr. Linda Hedges.

## 2011-12-20 NOTE — Progress Notes (Signed)
Cardiac Rehab Medication Review by a Pharmacist  Does the patient  feel that his/her medications are working for him/her?  yes  Has the patient been experiencing any side effects to the medications prescribed?  no  Does the patient measure his/her own blood pressure or blood glucose at home?  yes   Does the patient have any problems obtaining medications due to transportation or finances?   no  Understanding of regimen: good Understanding of indications: good Potential of compliance: good    Pharmacist comments: Patient expresses good understanding of his regimen and compliance. He states that he only misses a dose about every 1-2 months.    Billey Gosling 12/20/2011 8:10 AM

## 2011-12-24 ENCOUNTER — Encounter (HOSPITAL_COMMUNITY)
Admission: RE | Admit: 2011-12-24 | Discharge: 2011-12-24 | Disposition: A | Discharge status: Home / Self Care | Payer: Medicare Other | Source: Ambulatory Visit | Attending: Cardiology | Admitting: Cardiology

## 2011-12-24 NOTE — Progress Notes (Signed)
Pt started cardiac rehab today.  Pt tolerated light exercise without difficulty. Monitor showed SR with no ectopy noted.  Medication list reconciled.  Pt with coverage by Dr. Radford Pax as the medical director for cardiac rehab.  Pt seen by Dr. Dannielle Burn with [facility] in [place].  Continue to monitor.

## 2011-12-26 ENCOUNTER — Encounter (HOSPITAL_COMMUNITY)
Admission: RE | Admit: 2011-12-26 | Discharge: 2011-12-26 | Disposition: A | Discharge status: Home / Self Care | Payer: Medicare Other | Source: Ambulatory Visit | Attending: Cardiology | Admitting: Cardiology

## 2011-12-28 ENCOUNTER — Encounter (HOSPITAL_COMMUNITY)
Admission: RE | Admit: 2011-12-28 | Discharge: 2011-12-28 | Disposition: A | Discharge status: Home / Self Care | Payer: Medicare Other | Source: Ambulatory Visit | Attending: Cardiology | Admitting: Cardiology

## 2011-12-28 ENCOUNTER — Other Ambulatory Visit: Payer: Self-pay | Admitting: *Deleted

## 2011-12-28 MED ORDER — METOPROLOL TARTRATE 25 MG PO TABS: 25.0000 mg | ORAL_TABLET | Freq: Two times a day (BID) | ORAL | Status: DC

## 2011-12-28 MED ORDER — POTASSIUM CHLORIDE ER 10 MEQ PO TBCR: 10.0000 meq | EXTENDED_RELEASE_TABLET | Freq: Every day | ORAL | Status: DC

## 2011-12-28 MED ORDER — CLOTRIMAZOLE-BETAMETHASONE 1-0.05 % EX CREA: 1.0000 "application " | TOPICAL_CREAM | Freq: Two times a day (BID) | CUTANEOUS | Status: DC | PRN

## 2011-12-28 MED ORDER — FOLIC ACID 1 MG PO TABS: 1.0000 mg | ORAL_TABLET | Freq: Every day | ORAL | Status: DC

## 2011-12-28 MED ORDER — SIMVASTATIN 20 MG PO TABS: 40.0000 mg | ORAL_TABLET | Freq: Every day | ORAL | Status: DC

## 2011-12-28 NOTE — Telephone Encounter (Signed)
Pt requesting refills of Metoprolol, Folic Acid, Klor-Con, Simvastatin and Clotrimazole-Betamethasone crea be sent to [facility].

## 2011-12-31 ENCOUNTER — Encounter (HOSPITAL_COMMUNITY)
Admission: RE | Admit: 2011-12-31 | Discharge: 2011-12-31 | Disposition: A | Discharge status: Home / Self Care | Payer: Medicare Other | Source: Ambulatory Visit | Attending: Cardiology | Admitting: Cardiology

## 2011-12-31 NOTE — Progress Notes (Signed)
Reviewed home exercise with pt today.  Pt plans to walk at home for exercise.  Reviewed THR, pulse, RPE, sign and symptoms, and when to call 911 or MD.  Pt voiced understanding. Sybel Standish, MA, ACSM RCEP   

## 2012-01-04 ENCOUNTER — Encounter (HOSPITAL_COMMUNITY)
Admission: RE | Admit: 2012-01-04 | Discharge: 2012-01-04 | Disposition: A | Discharge status: Home / Self Care | Payer: Medicare Other | Source: Ambulatory Visit | Attending: Cardiology | Admitting: Cardiology

## 2012-01-07 ENCOUNTER — Other Ambulatory Visit: Payer: Self-pay | Admitting: *Deleted

## 2012-01-07 ENCOUNTER — Telehealth: Payer: Self-pay | Admitting: Internal Medicine

## 2012-01-07 ENCOUNTER — Encounter (HOSPITAL_COMMUNITY)
Admission: RE | Admit: 2012-01-07 | Discharge: 2012-01-07 | Disposition: A | Discharge status: Home / Self Care | Payer: Medicare Other | Source: Ambulatory Visit | Attending: Cardiology | Admitting: Cardiology

## 2012-01-07 ENCOUNTER — Telehealth: Payer: Self-pay | Admitting: *Deleted

## 2012-01-07 DIAGNOSIS — E7800 Pure hypercholesterolemia, unspecified: Secondary | ICD-10-CM

## 2012-01-07 DIAGNOSIS — I10 Essential (primary) hypertension: Secondary | ICD-10-CM

## 2012-01-07 DIAGNOSIS — E538 Deficiency of other specified B group vitamins: Secondary | ICD-10-CM

## 2012-01-07 MED ORDER — METOPROLOL SUCCINATE ER 25 MG PO TB24: ORAL_TABLET | ORAL | Status: DC

## 2012-01-07 NOTE — Telephone Encounter (Signed)
Pt left message regarding an incorrect medication and requested a callback, left message for pt to callback office.

## 2012-01-07 NOTE — Telephone Encounter (Signed)
[facility] for labs - orders entered

## 2012-01-07 NOTE — Telephone Encounter (Signed)
Pt states that everything was not done with last lab work, states he should of had his normal 6 mth lab work, not just the liver test

## 2012-01-11 ENCOUNTER — Encounter (HOSPITAL_COMMUNITY)
Admission: RE | Admit: 2012-01-11 | Discharge: 2012-01-11 | Disposition: A | Discharge status: Home / Self Care | Payer: Medicare Other | Source: Ambulatory Visit | Attending: Cardiology | Admitting: Cardiology

## 2012-01-11 DIAGNOSIS — I214 Non-ST elevation (NSTEMI) myocardial infarction: Secondary | ICD-10-CM | POA: Insufficient documentation

## 2012-01-11 DIAGNOSIS — Z5189 Encounter for other specified aftercare: Secondary | ICD-10-CM | POA: Insufficient documentation

## 2012-01-14 ENCOUNTER — Encounter (HOSPITAL_COMMUNITY)
Admission: RE | Admit: 2012-01-14 | Discharge: 2012-01-14 | Disposition: A | Discharge status: Home / Self Care | Payer: Medicare Other | Source: Ambulatory Visit | Attending: Cardiology | Admitting: Cardiology

## 2012-01-15 ENCOUNTER — Ambulatory Visit: Payer: Medicare Other

## 2012-01-16 ENCOUNTER — Encounter (HOSPITAL_COMMUNITY)
Admission: RE | Admit: 2012-01-16 | Discharge: 2012-01-16 | Disposition: A | Discharge status: Home / Self Care | Payer: Medicare Other | Source: Ambulatory Visit | Attending: Cardiology | Admitting: Cardiology

## 2012-01-17 ENCOUNTER — Other Ambulatory Visit (INDEPENDENT_AMBULATORY_CARE_PROVIDER_SITE_OTHER): Payer: Medicare Other

## 2012-01-17 ENCOUNTER — Ambulatory Visit: Payer: Medicare Other

## 2012-01-17 ENCOUNTER — Encounter: Payer: Self-pay | Admitting: Internal Medicine

## 2012-01-17 ENCOUNTER — Ambulatory Visit (INDEPENDENT_AMBULATORY_CARE_PROVIDER_SITE_OTHER): Payer: Medicare Other | Admitting: Internal Medicine

## 2012-01-17 VITALS — BP 116/58 | HR 59 | Temp 97.9°F | Resp 12 | Wt 177.1 lb

## 2012-01-17 DIAGNOSIS — E538 Deficiency of other specified B group vitamins: Secondary | ICD-10-CM

## 2012-01-17 DIAGNOSIS — J310 Chronic rhinitis: Secondary | ICD-10-CM

## 2012-01-17 DIAGNOSIS — I10 Essential (primary) hypertension: Secondary | ICD-10-CM

## 2012-01-17 DIAGNOSIS — E7800 Pure hypercholesterolemia, unspecified: Secondary | ICD-10-CM

## 2012-01-17 LAB — LIPID PANEL
Cholesterol: 96 mg/dL (ref 0–200)
VLDL: 27.8 mg/dL (ref 0.0–40.0)

## 2012-01-17 LAB — COMPREHENSIVE METABOLIC PANEL
Albumin: 3.9 g/dL (ref 3.5–5.2)
Alkaline Phosphatase: 67 U/L (ref 39–117)
BUN: 29 mg/dL — ABNORMAL HIGH (ref 6–23)
CO2: 26 mEq/L (ref 19–32)
Calcium: 9 mg/dL (ref 8.4–10.5)
Chloride: 106 mEq/L (ref 96–112)
GFR: 30.82 mL/min — ABNORMAL LOW (ref >60.00)
Glucose, Bld: 75 mg/dL (ref 70–99)
Potassium: 4.6 mEq/L (ref 3.5–5.1)
Sodium: 138 mEq/L (ref 135–145)
Total Protein: 6.9 g/dL (ref 6.0–8.3)

## 2012-01-17 LAB — CBC WITH DIFFERENTIAL/PLATELET
Basophils Relative: 0.6 % (ref 0.0–3.0)
Eosinophils Absolute: 0.1 10*3/uL (ref 0.0–0.7)
HCT: 40 % (ref 39.0–52.0)
Monocytes Absolute: 0.5 10*3/uL (ref 0.1–1.0)
Monocytes Relative: 9.1 % (ref 3.0–12.0)
Neutro Abs: 3.7 10*3/uL (ref 1.4–7.7)
Neutrophils Relative %: 66.5 % (ref 43.0–77.0)
Platelets: 207 10*3/uL (ref 150.0–400.0)
RDW: 15.2 % — ABNORMAL HIGH (ref 11.5–14.6)
WBC: 5.6 10*3/uL (ref 4.5–10.5)

## 2012-01-17 LAB — VITAMIN B12: Vitamin B-12: >1500 pg/mL — ABNORMAL HIGH (ref 211–911)

## 2012-01-17 MED ORDER — CYANOCOBALAMIN 1000 MCG/ML IJ SOLN
1000.0000 ug | Freq: Once | INTRAMUSCULAR | Status: AC
  Administered 2012-01-17: 1000 ug via INTRAMUSCULAR

## 2012-01-17 MED ORDER — NITROGLYCERIN 0.4 MG SL SUBL: 0.4000 mg | SUBLINGUAL_TABLET | SUBLINGUAL | Status: DC | PRN

## 2012-01-17 NOTE — Patient Instructions (Addendum)
Good to see you.  For the rhinorrhea (runny nose) try 1) reducing the flonase to once a day 2) take claritin 10 mg once a day.

## 2012-01-18 ENCOUNTER — Encounter (HOSPITAL_COMMUNITY)
Admission: RE | Admit: 2012-01-18 | Discharge: 2012-01-18 | Disposition: A | Discharge status: Home / Self Care | Payer: Medicare Other | Source: Ambulatory Visit | Attending: Cardiology | Admitting: Cardiology

## 2012-01-19 DIAGNOSIS — J310 Chronic rhinitis: Secondary | ICD-10-CM | POA: Insufficient documentation

## 2012-01-19 NOTE — Progress Notes (Signed)
Subjective:    Patient ID: [name], male    DOB: 1926/11/27, 79 y.o.   MRN: [id number]  HPI Mr. Steadman presents for persistent rhinorrhea which is bothersome. He denies fevers, chills, respiratory distress or SOB, purulent mucus. He has had no headache.  He continues to go to cardiac rehab post-CABG and is doing GREAT.  Past Medical History  Diagnosis Date  . Prostate cancer     a. 2001 s/p TURP.  [name] Hypertension     well-controlled.  [name] CAD (coronary artery disease)     a. S/P stenting to mid RCA, prox PDA 06/1999;  06/2007 Myoview: negative except for apical thinning, EF 68%, last Myoview 08/2008 w/o significant ischemia  . CKD (chronic kidney disease), stage III     a. stable with a creatinine around 1.9-2.0 followed by nephrology.  . Dyslipidemia   . Neck injury     a. C3-C4 and C4-C5 foraminal narrowing, severe  . Renal artery stenosis     a. 50% by cath 2001  . Pulmonary nodule     a. felt to be noncancerous.  Status post followup CT scan 4 mm and stable.  . Chronic UTI     a. Followed by Dr. Risa Grill - colonized/asymptomatic - not on abx   Past Surgical History  Procedure Date  . S/p ptca   . Cardia catherization 07-07-99  . Peripheral vascular catherization 11-24-03  . Renal circulation 10-01-03  . Stress cardiolite 05-04-05    spring 09-negative except for apical thinning, EF 68%  . Edg 07-17-1994  . Flexible sigmoidoscopy 11-03-1997  . Prostatectomy   . Lumbar spinal disk and neck fusion surgery   . Stents     X 2  . Coronary artery bypass graft 10/19/2011    Procedure: CORONARY ARTERY BYPASS GRAFTING (CABG);  Surgeon: Gaye Pollack, MD;  Location: [facility];  Service: Open Heart Surgery;  Laterality: N/A;  times three using Left Internal Mammary Artery and Right Greater Saphenouse Vein Graft harvested Endoscopically  . Cardiac surgery 10/18/12    open heart surgery   Family History  Problem Relation Age of Onset  . Coronary artery disease Father     died  @ 64  . Cancer Brother     Bladder  . Diabetes Neg Hx   . Prostate cancer Brother   . Nephritis Brother     died @ age 52.  . Other Brother     cerebral hemorrhage - died @ 63  . Other Mother     cerebral hemorrhage - died @ 4  . Heart disease Brother   . Arthritis Mother   . Breast cancer Other     niece x 2   History   Social History  . Marital Status: Married    Spouse Name: N/A    Number of Children: 2  . Years of Education: N/A   Occupational History  . 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     Social History Main Topics  . Smoking status: Never Smoker   . Smokeless tobacco: Never Used  . Alcohol Use: Yes     Comment: Rarely  . Drug Use: No  . Sexually Active: Not on file   Other Topics Concern  . Not on file   Social History Narrative   HSG, 1 year college.  married '52 -  3 years, divorced; married '56 - 3 years divorced; married '18-12 yrs - divorced; married '75 -. 1 son '57; 1 daughter - '53; 1 grandchild.  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  Very happily married.  End of life care: yes CPR, no long term mechanical ventilation, no heroic measures.    Current Outpatient Prescriptions on File Prior to Visit  Medication Sig Dispense Refill  . aspirin 325 MG buffered tablet Take 325 mg by mouth daily.        . clotrimazole-betamethasone (LOTRISONE) cream Apply 1 application topically 2 (two) times daily as needed.  60 g  3  . CYANOCOBALAMIN IJ Inject 1 mL as directed every 30 (thirty) days.       . ergocalciferol (VITAMIN D2) 50000 UNITS capsule Take 50,000 Units by mouth every 30 (thirty) days.        . fluticasone (FLONASE) 50 MCG/ACT nasal spray Place 1 spray into the nose 2 (two) times daily as needed. For cold symptoms      . folic acid (FOLVITE) 1 MG tablet Take 1 tablet (1 mg total) by mouth daily.  90 tablet  3  .  metoprolol succinate (TOPROL-XL) 25 MG 24 hr tablet Take 2 times daily by mouth.  180 tablet  3  . potassium chloride (K-DUR) 10 MEQ tablet Take 1 tablet (10 mEq total) by mouth daily.  90 tablet  3  . simvastatin (ZOCOR) 20 MG tablet Take 2 tablets (40 mg total) by mouth at bedtime.  180 tablet  3      Review of Systems System review is negative for any constitutional, cardiac, pulmonary, GI or neuro symptoms or complaints other than as described in the HPI.     Objective:   Physical Exam Filed Vitals:   01/17/12 1107  BP: 116/58  Pulse: 59  Temp: 97.9 F (36.6 C)  Resp: 12   Gen'l- WNWD white man in no distress HEENT - C&DS clear, no sinus tenderness Cor- RRR Pulm - normal respirations. Neuro - A&O x 3       Assessment & Plan:

## 2012-01-19 NOTE — Assessment & Plan Note (Signed)
Chronic and persistent rhinorrhea w/o other symptoms.  Plan OTC non-sedating antihistamine, e.g. Loratadine.

## 2012-01-21 ENCOUNTER — Encounter (HOSPITAL_COMMUNITY)
Admission: RE | Admit: 2012-01-21 | Discharge: 2012-01-21 | Disposition: A | Discharge status: Home / Self Care | Payer: Medicare Other | Source: Ambulatory Visit | Attending: Cardiology | Admitting: Cardiology

## 2012-01-23 ENCOUNTER — Encounter (HOSPITAL_COMMUNITY)
Admission: RE | Admit: 2012-01-23 | Discharge: 2012-01-23 | Disposition: A | Discharge status: Home / Self Care | Payer: Medicare Other | Source: Ambulatory Visit | Attending: Cardiology | Admitting: Cardiology

## 2012-01-25 ENCOUNTER — Encounter (HOSPITAL_COMMUNITY)
Admission: RE | Admit: 2012-01-25 | Discharge: 2012-01-25 | Disposition: A | Discharge status: Home / Self Care | Payer: Medicare Other | Source: Ambulatory Visit | Attending: Cardiology | Admitting: Cardiology

## 2012-01-28 ENCOUNTER — Encounter (HOSPITAL_COMMUNITY)
Admission: RE | Admit: 2012-01-28 | Discharge: 2012-01-28 | Disposition: A | Discharge status: Home / Self Care | Payer: Medicare Other | Source: Ambulatory Visit | Attending: Cardiology | Admitting: Cardiology

## 2012-01-30 ENCOUNTER — Encounter (HOSPITAL_COMMUNITY)
Admission: RE | Admit: 2012-01-30 | Discharge: 2012-01-30 | Disposition: A | Discharge status: Home / Self Care | Payer: Medicare Other | Source: Ambulatory Visit | Attending: Cardiology | Admitting: Cardiology

## 2012-02-01 ENCOUNTER — Encounter (HOSPITAL_COMMUNITY)
Admission: RE | Admit: 2012-02-01 | Discharge: 2012-02-01 | Disposition: A | Discharge status: Home / Self Care | Payer: Medicare Other | Source: Ambulatory Visit | Attending: Cardiology | Admitting: Cardiology

## 2012-02-04 ENCOUNTER — Encounter (HOSPITAL_COMMUNITY)
Admission: RE | Admit: 2012-02-04 | Discharge: 2012-02-04 | Disposition: A | Discharge status: Home / Self Care | Payer: Medicare Other | Source: Ambulatory Visit | Attending: Cardiology | Admitting: Cardiology

## 2012-02-06 ENCOUNTER — Encounter (HOSPITAL_COMMUNITY)
Admission: RE | Admit: 2012-02-06 | Discharge: 2012-02-06 | Disposition: A | Discharge status: Home / Self Care | Payer: Medicare Other | Source: Ambulatory Visit | Attending: Cardiology | Admitting: Cardiology

## 2012-02-07 ENCOUNTER — Encounter: Payer: Self-pay | Admitting: Internal Medicine

## 2012-02-08 ENCOUNTER — Encounter (HOSPITAL_COMMUNITY)
Admission: RE | Admit: 2012-02-08 | Discharge: 2012-02-08 | Disposition: A | Discharge status: Home / Self Care | Payer: Medicare Other | Source: Ambulatory Visit | Attending: Cardiology | Admitting: Cardiology

## 2012-02-11 ENCOUNTER — Encounter (HOSPITAL_COMMUNITY)
Admission: RE | Admit: 2012-02-11 | Discharge: 2012-02-11 | Disposition: A | Discharge status: Home / Self Care | Payer: Medicare Other | Source: Ambulatory Visit | Attending: Cardiology | Admitting: Cardiology

## 2012-02-11 DIAGNOSIS — I214 Non-ST elevation (NSTEMI) myocardial infarction: Secondary | ICD-10-CM | POA: Insufficient documentation

## 2012-02-11 DIAGNOSIS — Z5189 Encounter for other specified aftercare: Secondary | ICD-10-CM | POA: Insufficient documentation

## 2012-02-13 ENCOUNTER — Encounter (HOSPITAL_COMMUNITY)
Admission: RE | Admit: 2012-02-13 | Discharge: 2012-02-13 | Disposition: A | Discharge status: Home / Self Care | Payer: Medicare Other | Source: Ambulatory Visit | Attending: Cardiology | Admitting: Cardiology

## 2012-02-15 ENCOUNTER — Encounter (HOSPITAL_COMMUNITY)
Admission: RE | Admit: 2012-02-15 | Discharge: 2012-02-15 | Disposition: A | Discharge status: Home / Self Care | Payer: Medicare Other | Source: Ambulatory Visit | Attending: Cardiology | Admitting: Cardiology

## 2012-02-18 ENCOUNTER — Encounter (HOSPITAL_COMMUNITY)
Admission: RE | Admit: 2012-02-18 | Discharge: 2012-02-18 | Disposition: A | Discharge status: Home / Self Care | Payer: Medicare Other | Source: Ambulatory Visit | Attending: Cardiology | Admitting: Cardiology

## 2012-02-18 NOTE — Progress Notes (Signed)
Michell Witherell Keener 79 y.o. male Nutrition Note Spoke with pt.  Nutrition Plan and Nutrition Survey goals reviewed with pt. Pt is following Step 2 of the Therapeutic Lifestyle Changes diet. Pt expressed understanding of the above information reviewed. Pt aware of nutrition education classes offered and has attended the Lifestyle Skills nutrition class.  Nutrition Diagnosis   Food-and nutrition-related knowledge deficit related to lack of exposure to information as related to diagnosis of: ? CVD   Nutrition RX/ Estimated Daily Nutrition Needs for: wt maintenance 2100-2300 Kcal, 70-75 gm fat, 14-15 gm sat fat, 2.1-2.3 gm trans-fat, <1500 mg sodium   Nutrition Intervention   Pt's individual nutrition plan including cholesterol goals reviewed with pt.   Benefits of adopting Therapeutic Lifestyle Changes discussed when Medficts reviewed.   Pt to attend the Portion Distortion class - met 02/06/12   Pt to attend the  ? Nutrition I class                     ? Nutrition II class - met 02/05/12   Continue client-centered nutrition education by RD, as part of interdisciplinary care. Goal(s)   Pt to describe the benefit of including fruits, vegetables, whole grains, and low-fat dairy products in a heart healthy meal plan. Monitor and Evaluate progress toward nutrition goal with team. Nutrition Risk: Low

## 2012-02-19 ENCOUNTER — Ambulatory Visit (INDEPENDENT_AMBULATORY_CARE_PROVIDER_SITE_OTHER): Payer: Medicare Other | Admitting: *Deleted

## 2012-02-19 DIAGNOSIS — E538 Deficiency of other specified B group vitamins: Secondary | ICD-10-CM

## 2012-02-19 MED ORDER — CYANOCOBALAMIN 1000 MCG/ML IJ SOLN
1000.0000 ug | Freq: Once | INTRAMUSCULAR | Status: AC
  Administered 2012-02-19: 1000 ug via INTRAMUSCULAR

## 2012-02-20 ENCOUNTER — Encounter (HOSPITAL_COMMUNITY)
Admission: RE | Admit: 2012-02-20 | Discharge: 2012-02-20 | Disposition: A | Discharge status: Home / Self Care | Payer: Medicare Other | Source: Ambulatory Visit | Attending: Cardiology | Admitting: Cardiology

## 2012-02-22 ENCOUNTER — Encounter (HOSPITAL_COMMUNITY): Payer: Medicare Other

## 2012-02-25 ENCOUNTER — Encounter (HOSPITAL_COMMUNITY)
Admission: RE | Admit: 2012-02-25 | Discharge: 2012-02-25 | Disposition: A | Discharge status: Home / Self Care | Payer: Medicare Other | Source: Ambulatory Visit | Attending: Cardiology | Admitting: Cardiology

## 2012-02-27 ENCOUNTER — Encounter (HOSPITAL_COMMUNITY)
Admission: RE | Admit: 2012-02-27 | Discharge: 2012-02-27 | Disposition: A | Discharge status: Home / Self Care | Payer: Medicare Other | Source: Ambulatory Visit | Attending: Cardiology | Admitting: Cardiology

## 2012-02-29 ENCOUNTER — Encounter (HOSPITAL_COMMUNITY): Payer: Medicare Other

## 2012-02-29 ENCOUNTER — Telehealth (HOSPITAL_COMMUNITY): Payer: Self-pay | Admitting: *Deleted

## 2012-02-29 NOTE — Telephone Encounter (Signed)
Message left on department voice mail.  Out with cold.

## 2012-03-03 ENCOUNTER — Encounter (HOSPITAL_COMMUNITY)
Admission: RE | Admit: 2012-03-03 | Discharge: 2012-03-03 | Disposition: A | Discharge status: Home / Self Care | Payer: Medicare Other | Source: Ambulatory Visit | Attending: Cardiology | Admitting: Cardiology

## 2012-03-05 ENCOUNTER — Encounter (HOSPITAL_COMMUNITY)
Admission: RE | Admit: 2012-03-05 | Discharge: 2012-03-05 | Disposition: A | Discharge status: Home / Self Care | Payer: Medicare Other | Source: Ambulatory Visit | Attending: Cardiology | Admitting: Cardiology

## 2012-03-07 ENCOUNTER — Encounter (HOSPITAL_COMMUNITY)
Admission: RE | Admit: 2012-03-07 | Discharge: 2012-03-07 | Disposition: A | Discharge status: Home / Self Care | Payer: Medicare Other | Source: Ambulatory Visit | Attending: Cardiology | Admitting: Cardiology

## 2012-03-10 ENCOUNTER — Encounter (HOSPITAL_COMMUNITY): Payer: Medicare Other

## 2012-03-12 ENCOUNTER — Encounter (HOSPITAL_COMMUNITY)
Admission: RE | Admit: 2012-03-12 | Discharge: 2012-03-12 | Disposition: A | Discharge status: Home / Self Care | Payer: Medicare Other | Source: Ambulatory Visit | Attending: Cardiology | Admitting: Cardiology

## 2012-03-12 DIAGNOSIS — Z5189 Encounter for other specified aftercare: Secondary | ICD-10-CM | POA: Insufficient documentation

## 2012-03-12 DIAGNOSIS — I214 Non-ST elevation (NSTEMI) myocardial infarction: Secondary | ICD-10-CM | POA: Insufficient documentation

## 2012-03-14 ENCOUNTER — Encounter (HOSPITAL_COMMUNITY): Payer: Medicare Other

## 2012-03-17 ENCOUNTER — Encounter (HOSPITAL_COMMUNITY)
Admission: RE | Admit: 2012-03-17 | Discharge: 2012-03-17 | Disposition: A | Discharge status: Home / Self Care | Payer: Medicare Other | Source: Ambulatory Visit | Attending: Cardiology | Admitting: Cardiology

## 2012-03-18 ENCOUNTER — Ambulatory Visit (INDEPENDENT_AMBULATORY_CARE_PROVIDER_SITE_OTHER): Payer: Medicare Other

## 2012-03-18 MED ORDER — CYANOCOBALAMIN 1000 MCG/ML IJ SOLN
1000.0000 ug | Freq: Once | INTRAMUSCULAR | Status: AC
  Administered 2012-03-18: 1000 ug via INTRAMUSCULAR

## 2012-03-19 ENCOUNTER — Encounter (HOSPITAL_COMMUNITY)
Admission: RE | Admit: 2012-03-19 | Discharge: 2012-03-19 | Disposition: A | Discharge status: Home / Self Care | Payer: Medicare Other | Source: Ambulatory Visit | Attending: Cardiology | Admitting: Cardiology

## 2012-03-21 ENCOUNTER — Encounter (HOSPITAL_COMMUNITY)
Admission: RE | Admit: 2012-03-21 | Discharge: 2012-03-21 | Disposition: A | Discharge status: Home / Self Care | Payer: Medicare Other | Source: Ambulatory Visit | Attending: Cardiology | Admitting: Cardiology

## 2012-03-24 ENCOUNTER — Encounter (HOSPITAL_COMMUNITY)
Admission: RE | Admit: 2012-03-24 | Discharge: 2012-03-24 | Disposition: A | Discharge status: Home / Self Care | Payer: Medicare Other | Source: Ambulatory Visit | Attending: Cardiology | Admitting: Cardiology

## 2012-03-26 ENCOUNTER — Other Ambulatory Visit: Payer: Self-pay

## 2012-03-26 ENCOUNTER — Encounter (HOSPITAL_COMMUNITY): Payer: Self-pay | Admitting: Physician Assistant

## 2012-03-26 ENCOUNTER — Observation Stay (HOSPITAL_COMMUNITY)
Admission: EM | Admit: 2012-03-26 | Discharge: 2012-03-29 | Disposition: A | Discharge status: Home / Self Care | Payer: Medicare Other | Attending: Cardiology | Admitting: Cardiology

## 2012-03-26 ENCOUNTER — Encounter (HOSPITAL_COMMUNITY)
Admission: RE | Admit: 2012-03-26 | Discharge: 2012-03-26 | Disposition: A | Discharge status: Home / Self Care | Payer: Medicare Other | Source: Ambulatory Visit | Attending: Cardiology | Admitting: Cardiology

## 2012-03-26 ENCOUNTER — Emergency Department (HOSPITAL_COMMUNITY): Payer: Medicare Other

## 2012-03-26 DIAGNOSIS — D509 Iron deficiency anemia, unspecified: Secondary | ICD-10-CM | POA: Insufficient documentation

## 2012-03-26 DIAGNOSIS — I441 Atrioventricular block, second degree: Secondary | ICD-10-CM

## 2012-03-26 DIAGNOSIS — N184 Chronic kidney disease, stage 4 (severe): Secondary | ICD-10-CM | POA: Diagnosis present

## 2012-03-26 DIAGNOSIS — I129 Hypertensive chronic kidney disease with stage 1 through stage 4 chronic kidney disease, or unspecified chronic kidney disease: Secondary | ICD-10-CM | POA: Insufficient documentation

## 2012-03-26 DIAGNOSIS — I2510 Atherosclerotic heart disease of native coronary artery without angina pectoris: Secondary | ICD-10-CM

## 2012-03-26 DIAGNOSIS — N189 Chronic kidney disease, unspecified: Secondary | ICD-10-CM | POA: Insufficient documentation

## 2012-03-26 DIAGNOSIS — R001 Bradycardia, unspecified: Secondary | ICD-10-CM

## 2012-03-26 DIAGNOSIS — Z951 Presence of aortocoronary bypass graft: Secondary | ICD-10-CM

## 2012-03-26 HISTORY — DX: Bradycardia, unspecified: R00.1

## 2012-03-26 HISTORY — DX: Unspecified atrial fibrillation: I48.91

## 2012-03-26 HISTORY — DX: Embolism and thrombosis of superficial veins of unspecified lower extremity: I82.819

## 2012-03-26 HISTORY — DX: Atrioventricular block, second degree: I44.1

## 2012-03-26 LAB — URINALYSIS, ROUTINE W REFLEX MICROSCOPIC
Ketones, ur: NEGATIVE mg/dL
Leukocytes, UA: NEGATIVE
Nitrite: NEGATIVE
Protein, ur: NEGATIVE mg/dL
Urobilinogen, UA: 0.2 mg/dL (ref 0.0–1.0)

## 2012-03-26 LAB — CREATININE, SERUM: Creatinine, Ser: 1.95 mg/dL — ABNORMAL HIGH (ref 0.50–1.35)

## 2012-03-26 LAB — COMPREHENSIVE METABOLIC PANEL
ALT: 14 U/L (ref 0–53)
BUN: 29 mg/dL — ABNORMAL HIGH (ref 6–23)
Calcium: 9.2 mg/dL (ref 8.4–10.5)
Creatinine, Ser: 2.01 mg/dL — ABNORMAL HIGH (ref 0.50–1.35)
GFR calc Af Amer: 34 mL/min — ABNORMAL LOW (ref >90)
Glucose, Bld: 98 mg/dL (ref 70–99)
Sodium: 136 mEq/L (ref 135–145)
Total Protein: 7 g/dL (ref 6.0–8.3)

## 2012-03-26 LAB — CBC
MCH: 24.8 pg — ABNORMAL LOW (ref 26.0–34.0)
MCHC: 32.7 g/dL (ref 30.0–36.0)
MCV: 76 fL — ABNORMAL LOW (ref 78.0–100.0)
Platelets: 177 10*3/uL (ref 150–400)
RBC: 4.91 MIL/uL (ref 4.22–5.81)

## 2012-03-26 LAB — PROTIME-INR
INR: 1.06 (ref 0.00–1.49)
Prothrombin Time: 13.7 seconds (ref 11.6–15.2)

## 2012-03-26 LAB — CBC WITH DIFFERENTIAL/PLATELET
Eosinophils Absolute: 0.1 10*3/uL (ref 0.0–0.7)
Eosinophils Relative: 1 % (ref 0–5)
Lymphs Abs: 1 10*3/uL (ref 0.7–4.0)
MCH: 24.6 pg — ABNORMAL LOW (ref 26.0–34.0)
MCV: 75.4 fL — ABNORMAL LOW (ref 78.0–100.0)
Monocytes Absolute: 0.5 10*3/uL (ref 0.1–1.0)
Platelets: 176 10*3/uL (ref 150–400)
RBC: 5.12 MIL/uL (ref 4.22–5.81)

## 2012-03-26 LAB — TROPONIN I
Troponin I: <0.3 ng/mL (ref <0.30)
Troponin I: <0.3 ng/mL (ref <0.30)

## 2012-03-26 MED ORDER — SODIUM CHLORIDE 0.9 % IJ SOLN: 3.0000 mL | INTRAMUSCULAR | Status: DC | PRN

## 2012-03-26 MED ORDER — ACETAMINOPHEN 325 MG PO TABS
650.0000 mg | ORAL_TABLET | ORAL | Status: DC | PRN
  Administered 2012-03-26: 650 mg via ORAL
  Filled 2012-03-26 (×2): qty 2

## 2012-03-26 MED ORDER — AMLODIPINE BESYLATE 5 MG PO TABS
5.0000 mg | ORAL_TABLET | Freq: Every day | ORAL | Status: DC
  Administered 2012-03-26 – 2012-03-29 (×4): 5 mg via ORAL
  Filled 2012-03-26 (×4): qty 1

## 2012-03-26 MED ORDER — FOLIC ACID 1 MG PO TABS
1.0000 mg | ORAL_TABLET | Freq: Every day | ORAL | Status: DC
Start: 2012-03-27 — End: 2012-03-29
  Administered 2012-03-27 – 2012-03-29 (×3): 1 mg via ORAL
  Filled 2012-03-26 (×3): qty 1

## 2012-03-26 MED ORDER — SODIUM CHLORIDE 0.9 % IJ SOLN
3.0000 mL | Freq: Two times a day (BID) | INTRAMUSCULAR | Status: DC
  Administered 2012-03-26 – 2012-03-29 (×6): 3 mL via INTRAVENOUS

## 2012-03-26 MED ORDER — FLUTICASONE PROPIONATE 50 MCG/ACT NA SUSP
1.0000 | Freq: Two times a day (BID) | NASAL | Status: DC | PRN
  Filled 2012-03-26: qty 16

## 2012-03-26 MED ORDER — ONDANSETRON HCL 4 MG/2ML IJ SOLN: 4.0000 mg | Freq: Four times a day (QID) | INTRAMUSCULAR | Status: DC | PRN

## 2012-03-26 MED ORDER — NITROGLYCERIN 0.4 MG SL SUBL: 0.4000 mg | SUBLINGUAL_TABLET | SUBLINGUAL | Status: DC | PRN

## 2012-03-26 MED ORDER — ASPIRIN 325 MG PO TABS
325.0000 mg | ORAL_TABLET | Freq: Every day | ORAL | Status: DC
  Administered 2012-03-27 – 2012-03-29 (×3): 325 mg via ORAL
  Filled 2012-03-26 (×3): qty 1

## 2012-03-26 MED ORDER — SIMVASTATIN 40 MG PO TABS
40.0000 mg | ORAL_TABLET | Freq: Every day | ORAL | Status: DC
  Administered 2012-03-26 – 2012-03-28 (×3): 40 mg via ORAL
  Filled 2012-03-26 (×4): qty 1

## 2012-03-26 MED ORDER — SODIUM CHLORIDE 0.9 % IV SOLN: 250.0000 mL | INTRAVENOUS | Status: DC | PRN

## 2012-03-26 MED ORDER — HEPARIN SODIUM (PORCINE) 5000 UNIT/ML IJ SOLN
5000.0000 [IU] | Freq: Three times a day (TID) | INTRAMUSCULAR | Status: DC
  Filled 2012-03-26 (×8): qty 1

## 2012-03-26 MED ORDER — ASPIRIN BUFFERED 325 MG PO TABS: 325.0000 mg | ORAL_TABLET | Freq: Every day | ORAL | Status: DC

## 2012-03-26 MED ORDER — POTASSIUM CHLORIDE ER 10 MEQ PO TBCR
10.0000 meq | EXTENDED_RELEASE_TABLET | Freq: Every day | ORAL | Status: DC
  Administered 2012-03-27 – 2012-03-29 (×3): 10 meq via ORAL
  Filled 2012-03-26 (×3): qty 1

## 2012-03-26 NOTE — ED Notes (Signed)
Cardiology PA into see pt.  No distress noted.

## 2012-03-26 NOTE — H&P (Signed)
History and Physical  Patient ID: [name] MRN: [id number], DOB: [birth year] Date of Encounter: 03/26/2012, 4:20 PM Primary Physician: Adella Hare, MD Primary Cardiologist: Dannielle Burn [facility]  Chief Complaint: weakness Reason for Admission: weakness, bradycardia  HPI: Mr. Rovinsky is an 79 y/o M with history of CAD (s/p remote mid RCA & prox LAD stenting and NSTEMI/3v CABG 10/2011), CKD baseline Cr 1.9-2, HTN, protate CA, and post-op afib at time of CABG (rx'd IV amiodarone->SR) who presented to [facility] with an episode of weakness and bradycardia at cardiac rehab. It appears sometime after his f/u appt with [facility] his amiodarone was stopped. He has been doing great with cardiac rehab until today. He was able to complete his rowing and step exercises without difficulty. Subsequently, he went to do his treadmill exercise at a walking pace with incline. After 4 minutes he began to feel tired so he decreased the speed and incline. At 5 minutes he still felt fatigued and then began feeling weak and dizzy. He got off of the treadmill and sat over to the side. His HR was noted to be 28 on the monitor. No CP or SOB. He was reportedly gray and did not respond to verbal stimulation at first but did respond to being tapped. He was clammy and felt bad. He did not fully lose consciousness. This has never happened to him before. No recent symptoms otherwise, including CP, SOB, presyncope, syncope, palpitations, LEE. BP 162/60 with regular pulse, o2 sat 100% on RA. He began to feel better but had residual headache. He was sent to the ER for further eval where he has remained in NSR.(There is a HR of 139 included in vitals but review of tele at that time proves this to be an error. The nurse thinks it was entered due to artifact.) EKG and CXR nonacute. Labs significant for improving Hgb from post-op level, neg troponin x 1, Cr at baseline, normal glucose, and normal K. He is on Toprol XL 25mg  BID. He  took all of his medications as usual before cardiac rehab. At present, he reports feeling drowsy/fatigued, but denies weakness, dizziness, SOB, chest pain, or palpitations. He has been under increased stress lately with the news of a brother with metastatic CA, a friend with cardiac arrest, and his wife had hand surgery.  Strips sent from cardiac rehab show what appears to be Wenckebach then Mobitz II then junctional. He then appears to pick back up with P wave activity and subsequently goes into a tachycardia with difficult-to-see p waves, then sinus rhythm.  Past Medical History  Diagnosis Date  . Prostate cancer     a. 2001 s/p TURP.  [name] Hypertension     well-controlled.  [name] CAD (coronary artery disease)     a. S/P stenting to mid RCA, prox PDA 06/1999. b. NSTEMI/CABG x 3 in 10/2011 with LIMA to LAD, SVG to PDA, and SVG to OM1.   . CKD (chronic kidney disease), stage III     a. stable with a creatinine around 1.9-2.0 followed by nephrology.  . Dyslipidemia   . Neck injury     a. C3-C4 and C4-C5 foraminal narrowing, severe  . Renal artery stenosis     a. 50% by cath 2001  . Pulmonary nodule     a. felt to be noncancerous.  Status post followup CT scan 4 mm and stable.  . Chronic UTI     a. Followed by Dr. Risa Grill - colonized/asymptomatic - not on abx  .  Atrial fibrillation     a. Post-op from CABG.  [name] Acute superficial venous thrombosis of lower extremity     a. RLE after CABG, neg dopp for DVT.     Most Recent Cardiac Studies: Cardiac Cath 10/2011 Procedural Details: The right wrist was prepped, draped, and anesthetized with 1% lidocaine. Using the modified Seldinger technique, a 5 French sheath was introduced into the right radial artery. 3 mg of verapamil was administered through the sheath, weight-based unfractionated heparin was administered intravenously. Standard Judkins catheters were used for selective coronary angiography. Catheter exchanges were performed over an exchange  length guidewire. There were no immediate procedural complications. A TR band was used for radial hemostasis at the completion of the procedure. The patient was transferred to the post catheterization recovery area for further monitoring.  Procedural Findings:  Hemodynamics:  AO 88/49 with a mean of 69 mmHg  LV 88/5 mmHg  Coronary angiography:  Coronary dominance: right  Left mainstem: There is mild tapering of the distal left main of 20%. The left coronary that trifurcates into the LAD, intermediate, and circumflex vessels.  Left anterior descending (LAD): The LAD has moderate diffuse disease in the proximal vessel up to 70%. The mid to distal LAD is diffusely and severely diseased up to 90%. This is a long segment of disease. The first diagonal has a 50% disease proximally.  The ramus intermediate branch is moderate in size and without significant disease.  Left circumflex (LCx): The left circumflex coronary has a 7080% stenosis proximally. The circumflex gives rise to 2 marginal branches.  Right coronary artery (RCA): The right coronary is a dominant vessel. The proximal vessel there is only mild disease up to 30%. The stent in the mid vessel is widely patent. Following the stent however there is a long segment of disease of the proximal up to 99%. There is TIMI grade 2 flow distally. Prior to the bifurcation of the PDA and posterior lateral branches there is a 60% stenosis. The stent in the PDA is widely patent. The posterior lateral branch of the right coronary has an 80% stenosis.  Left ventriculography: Not performed  Final Conclusions:  1. Severe three-vessel obstructive coronary disease. There has been significant progression of disease compared to 2009.  Recommendations: We will obtain an echocardiogram to assess LV function. Continue aggressive medical support. We'll obtain CT surgery consultation for consideration of bypass surgery.  2D echo 10/2011 Study Conclusions - Left ventricle:  The cavity size was normal. Wall thickness was normal. Systolic function was normal. The estimated ejection fraction was in the range of 50% to 55%. There was an increased relative contribution of atrial contraction to ventricular filling. - Left atrium: The atrium was mildly dilated.   Surgical History:  Past Surgical History  Procedure Laterality Date  . S/p ptca    . Cardia catherization  07-07-99  . Peripheral vascular catherization  11-24-03  . Renal circulation  10-01-03  . Stress cardiolite  05-04-05    spring 09-negative except for apical thinning, EF 68%  . Edg  07-17-1994  . Flexible sigmoidoscopy  11-03-1997  . Prostatectomy    . Lumbar spinal disk and neck fusion surgery    . Stents      X 2  . Coronary artery bypass graft  10/19/2011    Procedure: CORONARY ARTERY BYPASS GRAFTING (CABG);  Surgeon: Gaye Pollack, MD;  Location: [facility];  Service: Open Heart Surgery;  Laterality: N/A;  times three using Left Internal Mammary Artery  and Right Greater Saphenouse Vein Graft harvested Endoscopically  . Cardiac surgery  10/18/12    open heart surgery  . Colonoscopy  04/12/07     Home Meds: Prior to Admission medications   Medication Sig Start Date End Date Taking? Authorizing Provider  aspirin 325 MG buffered tablet Take 325 mg by mouth daily.     Yes Historical Provider, MD  CYANOCOBALAMIN IJ Inject 1 mL as directed every 30 (thirty) days. Usually given between the 1st and 5th of each month   Yes Historical Provider, MD  ergocalciferol (VITAMIN D2) 50000 UNITS capsule Take 50,000 Units by mouth every 30 (thirty) days. 15 th of each month   Yes Historical Provider, MD  fluticasone (FLONASE) 50 MCG/ACT nasal spray Place 1 spray into the nose 2 (two) times daily as needed. For cold symptoms   Yes Historical Provider, MD  folic acid (FOLVITE) 1 MG tablet Take 1 tablet (1 mg total) by mouth daily. 12/28/11  Yes Neena Rhymes, MD  metoprolol succinate (TOPROL-XL) 25 MG 24 hr tablet  Take 2 times daily by mouth. 01/07/12  Yes Neena Rhymes, MD  potassium chloride (K-DUR) 10 MEQ tablet Take 1 tablet (10 mEq total) by mouth daily. 12/28/11 12/27/12 Yes Neena Rhymes, MD  simvastatin (ZOCOR) 20 MG tablet Take 2 tablets (40 mg total) by mouth at bedtime. 12/28/11  Yes Neena Rhymes, MD  nitroGLYCERIN (NITROSTAT) 0.4 MG SL tablet Place 1 tablet (0.4 mg total) under the tongue every 5 (five) minutes as needed for chest pain. 01/17/12   Neena Rhymes, MD    Allergies:  Allergies  Allergen Reactions  . Amoxicillin Nausea Only  . Aspirin     REACTION: upset stomach; tolerates coated aspirin  . Atarax (Hydroxyzine Hcl)   . Ciprofloxacin Nausea Only  . Codeine     REACTION: Stomach upset  . Hydrocodone Nausea Only  . Macrobid (Nitrofurantoin Macrocrystal) Nausea Only  . Niacin   . Niacin-Lovastatin Er     Unsure of reaction. Taking simvastatin at home without problems  . Nitrofurantoin   . Trimox (Amoxicillin Trihydrate)     History   Social History  . Marital Status: Married    Spouse Name: N/A    Number of Children: 2  . Years of Education: N/A   Occupational History  . 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     Social History Main Topics  . Smoking status: Never Smoker   . Smokeless tobacco: Never Used  . Alcohol Use: Yes     Comment: Rarely  . Drug Use: No  . Sexually Active: Not on file   Other Topics Concern  . Not on file   Social History Narrative   HSG, 1 year college.  married '52 - 3 years, divorced; married '56 - 3 years divorced; married '80-12 yrs - divorced; married '75 -. 1 son '57; 1 daughter - '53; 1 grandchild.  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  Very happily married.  End of life care: yes CPR, no long term mechanical ventilation, no heroic measures.     Family History  Problem Relation Age  of Onset  . Coronary artery disease Father     died @ 64  . Cancer Brother     Bladder  . Diabetes Neg Hx   .  Prostate cancer Brother   . Nephritis Brother     died @ age 82.  . Other Brother     cerebral hemorrhage - died @ 58  . Other Mother     cerebral hemorrhage - died @ 31  . Heart disease Brother   . Arthritis Mother   . Breast cancer Other     niece x 2    Review of Systems: Patient reports fatigue/drowsiness.  General: negative for chills, fever, night sweats or weight changes.  Cardiovascular: negative for chest pain, edema, orthopnea, palpitations, paroxysmal nocturnal dyspnea, shortness of breath or dyspnea on exertion Dermatological: negative for rash Respiratory: negative for cough or wheezing Urologic: negative for hematuria Abdominal: negative for nausea, vomiting, diarrhea, bright red blood per rectum, melena, or hematemesis Neurologic: negative for visual changes. See above. All other systems reviewed and are otherwise negative except as noted above.  Labs:   Lab Results  Component Value Date   WBC 7.1 03/26/2012   HGB 12.6* 03/26/2012   HCT 38.6* 03/26/2012   MCV 75.4* 03/26/2012   PLT 176 03/26/2012    Recent Labs Lab 03/26/12 1346  NA 136  K 4.3  CL 103  CO2 23  BUN 29*  CREATININE 2.01*  CALCIUM 9.2  PROT 7.0  BILITOT 0.7  ALKPHOS 71  ALT 14  AST 19  GLUCOSE 98    Recent Labs  03/26/12 1346  TROPONINI <0.30   Lab Results  Component Value Date   CHOL 96 01/17/2012   HDL 32.80* 01/17/2012   LDLCALC 35 01/17/2012   TRIG 139.0 01/17/2012    Radiology/Studies:  Dg Chest Portable 1 View 03/26/2012  *RADIOLOGY REPORT*  Clinical Data: Bradycardia.  PORTABLE CHEST - 1 VIEW  Comparison:  Chest x-ray 11/13/2011.  Findings: Lung volumes are normal.  No consolidative airspace disease.  No pleural effusions.  No pneumothorax.  No pulmonary nodule or mass noted.  Pulmonary vasculature and the cardiomediastinal silhouette are within normal limits.  Atherosclerosis in the thoracic aorta.  Status post sternotomy for CABG.  IMPRESSION: 1. No radiographic evidence of acute cardiopulmonary disease. 2.  Atherosclerosis.   Original Report Authenticated By: Vinnie Langton, M.D.     EKG: NSR 56bpm left axis deviation, borderline T wave abnormalities II, III, avF and ST upsloping I/avL similar to prior tracing 10/2011  Physical Exam: Blood pressure 149/67, pulse 58, temperature 97.8 F (36.6 C), temperature source Oral, resp. rate 20, SpO2 100.00%. General: Well developed, well-appearing elderly WM in no acute distress. Head: Normocephalic, atraumatic, sclera non-icteric, no xanthomas, nares are without discharge.  Neck: Negative for carotid bruits. JVD not elevated. Lungs: Clear bilaterally to auscultation without wheezes, rales, or rhonchi. Breathing is unlabored. Heart: RRR with S1 S2. No murmurs, rubs, or gallops appreciated. Abdomen: Soft, non-tender, non-distended with normoactive bowel sounds. No hepatomegaly. No rebound/guarding. No obvious abdominal masses. Msk:  Strength and tone appear normal for age. Extremities: No clubbing or cyanosis. No edema.  Distal pedal pulses are 2+ and equal bilaterally. Neuro: Alert and oriented X 3. No focal deficit. No facial asymmetry. Moves all extremities spontaneously. Psych:  Responds to questions appropriately with a normal affect.   ASSESSMENT AND PLAN:   1. Symptomatic bradycardia 2. CAD s/p CABG 10/2011, remote stenting 3. Post-op afib at time of CABG 4. CKD with normal potassium 5. Microcytic anemia, improved from prior  Plan: stop beta blocker and observe on telemetry. Check Mg level. Otherwise continue home medications. Will have EP round on in AM.  No symptoms to suggest an ACS.  Signed, Melina Copa PA-C 03/26/2012, 4:20 PM  Patient seen and examined and history reviewed. Agree with above findings and plan. 79 yo WM s/p CABG presents with near syncope and bradycardia witnessed at  cardiac Rehab. No prior symptoms of arrythmia. Amiodarone discontinued in December. Monitor strips show bradycardia with Mobitz type 1 AV block, 2:1 AV block, and junctional bradycardia with VA dissociation. Now in NSR. On Toprol. Electrolytes normal. Will observe on telemetry overnight. Hold Toprol. If recurrent bradycardia may need a pacemaker. Will start amlodipine for BP.  Collier Salina [facility] 03/26/2012 6:09 PM

## 2012-03-26 NOTE — ED Notes (Signed)
No distress noted.  Resp symmetrical and unlabored.  Skin warm and dry.  Denies chest pain.  States that he has a headache 1/10.

## 2012-03-26 NOTE — ED Provider Notes (Signed)
History     CSN: [id number]  Arrival date & time 03/26/12  1331   First MD Initiated Contact with Patient 03/26/12 1343      Chief Complaint  Patient presents with  . Bradycardia    (Consider location/radiation/quality/duration/timing/severity/associated sxs/prior treatment) HPI Comments: Patient from cardiac rehabilitation with episode of dizziness, lightheadedness and bradycardia. He was walking on the treadmill and became to feel dizzy and has a town when he was cool, clammy and diaphoretic. He  was found to be in a complete heart block at a rate of 20. This lasted about 30 seconds. he denies any chest pain or shortness of breath. Feels back to baseline now and no longer feels dizzy. He endorses dull headache. Denies any nausea or vomiting. Good by mouth intake and urine output.   The history is provided by the patient and a relative.    Past Medical History  Diagnosis Date  . Prostate cancer     a. 2001 s/p TURP.  [name] Hypertension     well-controlled.  [name] CAD (coronary artery disease)     a. S/P stenting to mid RCA, prox PDA 06/1999. b. NSTEMI/CABG x 3 in 10/2011 with LIMA to LAD, SVG to PDA, and SVG to OM1.   . CKD (chronic kidney disease), stage III     a. stable with a creatinine around 1.9-2.0 followed by nephrology.  . Dyslipidemia   . Neck injury     a. C3-C4 and C4-C5 foraminal narrowing, severe  . Renal artery stenosis     a. 50% by cath 2001  . Pulmonary nodule     a. felt to be noncancerous.  Status post followup CT scan 4 mm and stable.  . Chronic UTI     a. Followed by Dr. Risa Grill - colonized/asymptomatic - not on abx  . Atrial fibrillation     a. Post-op from CABG.  [name] Acute superficial venous thrombosis of lower extremity     a. RLE after CABG, neg dopp for DVT.    Past Surgical History  Procedure Laterality Date  . S/p ptca    . Cardia catherization  07-07-99  . Peripheral vascular catherization  11-24-03  . Renal circulation  10-01-03  . Stress  cardiolite  05-04-05    spring 09-negative except for apical thinning, EF 68%  . Edg  07-17-1994  . Flexible sigmoidoscopy  11-03-1997  . Prostatectomy    . Lumbar spinal disk and neck fusion surgery    . Stents      X 2  . Coronary artery bypass graft  10/19/2011    Procedure: CORONARY ARTERY BYPASS GRAFTING (CABG);  Surgeon: Gaye Pollack, MD;  Location: [facility];  Service: Open Heart Surgery;  Laterality: N/A;  times three using Left Internal Mammary Artery and Right Greater Saphenouse Vein Graft harvested Endoscopically  . Cardiac surgery  10/18/12    open heart surgery  . Colonoscopy  04/12/07    Family History  Problem Relation Age of Onset  . Coronary artery disease Father     died @ 28  . Cancer Brother     Bladder  . Diabetes Neg Hx   . Prostate cancer Brother   . Nephritis Brother     died @ age 34.  . Other Brother     cerebral hemorrhage - died @ 60  . Other Mother     cerebral hemorrhage - died @ 72  . Heart disease Brother   . Arthritis Mother   .  Breast cancer Other     niece x 2    History  Substance Use Topics  . Smoking status: Never Smoker   . Smokeless tobacco: Never Used  . Alcohol Use: Yes     Comment: Rarely      Review of Systems  Constitutional: Positive for activity change, appetite change and fatigue. Negative for fever.  HENT: Negative for congestion and rhinorrhea.   Eyes: Negative for visual disturbance.  Respiratory: Negative for cough, chest tightness and shortness of breath.   Cardiovascular: Positive for palpitations. Negative for chest pain.  Gastrointestinal: Negative for nausea, vomiting, abdominal pain and diarrhea.  Genitourinary: Negative for dysuria and hematuria.  Musculoskeletal: Negative for back pain.  Skin: Negative for rash.  Neurological: Negative for dizziness, weakness and headaches.    Allergies  Amoxicillin; Aspirin; Atarax; Ciprofloxacin; Codeine; Hydrocodone; Macrobid; Niacin; Niacin-lovastatin er;  Nitrofurantoin; and Trimox  Home Medications   Current Outpatient Rx  Name  Route  Sig  Dispense  Refill  . aspirin 325 MG buffered tablet   Oral   Take 325 mg by mouth daily.           . CYANOCOBALAMIN IJ   Injection   Inject 1 mL as directed every 30 (thirty) days. Usually given between the 1st and 5th of each month         . ergocalciferol (VITAMIN D2) 50000 UNITS capsule   Oral   Take 50,000 Units by mouth every 30 (thirty) days. 15 th of each month         . fluticasone (FLONASE) 50 MCG/ACT nasal spray   Nasal   Place 1 spray into the nose 2 (two) times daily as needed. For cold symptoms         . folic acid (FOLVITE) 1 MG tablet   Oral   Take 1 tablet (1 mg total) by mouth daily.   90 tablet   3   . metoprolol succinate (TOPROL-XL) 25 MG 24 hr tablet      Take 2 times daily by mouth.   180 tablet   3   . potassium chloride (K-DUR) 10 MEQ tablet   Oral   Take 1 tablet (10 mEq total) by mouth daily.   90 tablet   3   . simvastatin (ZOCOR) 20 MG tablet   Oral   Take 2 tablets (40 mg total) by mouth at bedtime.   180 tablet   3   . nitroGLYCERIN (NITROSTAT) 0.4 MG SL tablet   Sublingual   Place 1 tablet (0.4 mg total) under the tongue every 5 (five) minutes as needed for chest pain.   15 tablet   3     BP 161/70  Pulse 58  Temp(Src) 97.8 F (36.6 C) (Oral)  Resp 20  SpO2 100%  Physical Exam  Constitutional: He is oriented to person, place, and time. He appears well-developed and well-nourished. No distress.  HENT:  Head: Normocephalic and atraumatic.  Mouth/Throat: Oropharynx is clear and moist. No oropharyngeal exudate.  Eyes: Conjunctivae and EOM are normal. Pupils are equal, round, and reactive to light.  Neck: Normal range of motion. Neck supple.  Cardiovascular: Normal rate, regular rhythm and normal heart sounds.   No murmur heard. Pulmonary/Chest: Effort normal and breath sounds normal. No respiratory distress.  Abdominal: Soft.  There is no tenderness. There is no rebound and no guarding.  Musculoskeletal: Normal range of motion. He exhibits no edema and no tenderness.  Neurological: He is alert and  oriented to person, place, and time. No cranial nerve deficit. He exhibits normal muscle tone. Coordination normal.  Skin: Skin is warm.    ED Course  Procedures (including critical care time)  Labs Reviewed  CBC WITH DIFFERENTIAL - Abnormal; Notable for the following:    Hemoglobin 12.6 (*)    HCT 38.6 (*)    MCV 75.4 (*)    MCH 24.6 (*)    RDW 16.1 (*)    Neutrophils Relative 78 (*)    All other components within normal limits  COMPREHENSIVE METABOLIC PANEL - Abnormal; Notable for the following:    BUN 29 (*)    Creatinine, Ser 2.01 (*)    GFR calc non Af Amer 29 (*)    GFR calc Af Amer 34 (*)    All other components within normal limits  TROPONIN I  PROTIME-INR  URINALYSIS, ROUTINE W REFLEX MICROSCOPIC   Dg Chest Portable 1 View  03/26/2012  *RADIOLOGY REPORT*  Clinical Data: Bradycardia.  PORTABLE CHEST - 1 VIEW  Comparison:  Chest x-ray 11/13/2011.  Findings: Lung volumes are normal.  No consolidative airspace disease.  No pleural effusions.  No pneumothorax.  No pulmonary nodule or mass noted.  Pulmonary vasculature and the cardiomediastinal silhouette are within normal limits. Atherosclerosis in the thoracic aorta.  Status post sternotomy for CABG.  IMPRESSION: 1. No radiographic evidence of acute cardiopulmonary disease. 2.  Atherosclerosis.   Original Report Authenticated By: Vinnie Langton, M.D.      No diagnosis found.    MDM  Episode of bradycardia and syncope while walking on the treadmill with transient complete heart block. Now in normal sinus rhythm with good mentation.  EKG with nsr.  Troponin negative. Electrolytes stable. D/w Cardiology who will evaluate. Care transferred to Dr. Wilson Singer at sign out.     Date: 03/26/2012  Rate:  56  Rhythm: normal sinus rhythm  QRS Axis: left   Intervals: normal  ST/T Wave abnormalities: normal  Conduction Disutrbances:none  Narrative Interpretation: inferior T wave inversions  Old EKG Reviewed: unchanged    Ezequiel Essex, MD 03/26/12 [phone]

## 2012-03-26 NOTE — ED Notes (Signed)
Meal provided from ER stock.  Awaiting admission orders.  No distress noted.  Pt is asymptomatic at the present.

## 2012-03-26 NOTE — Progress Notes (Signed)
At the 1115 cardiac rehab exercise class during third station - after completing 10 minutes on the stepper and rower.  At four minutes in pt began to feel tired. Pt decreased his speed on the treadmill and his incline.  Pt did not feel any better and got off the treadmill and sat over to the side.  Noted on the monitor heart rate of 28.  Pt grey and at first did not respond to verbal stimulation but did respond to physical stimulation.  Pt felt clammy and complained of feeling bad.  BP 162/60 with regular pulse, o2 sat 100% on RA.  Pt began to feel better but complained of slight headache.  Monitor showed Sr and bp rechecked 160/60 standing  bp 142/60.  Pt escorted to treatment room.  Pt denies any complaints other than headache.  Spoke to [place] at Dr. Arlina Robes office, strips faxed to office.  Dr. Dannielle Burn is involved in a procedure and rehab staff advised to transfer to ER for further treatment.

## 2012-03-26 NOTE — ED Notes (Signed)
Per report from cardiac rehab pt is a post 3 vessel cardiac bypass from Oct. 2013 (Dr. Cyndia Bent)  He has been going to cardiac rehab without incident until today.  Pt was ambulating on the treadmill and began to feel weak he initially decreased the speed but the symptoms did not resolve.  Pt then went to sit down with staff when he was noted to be cool, clammy, and diaphoretic.  Pt was noted to have converted into a complete heart block with a ventricular rate of 20 bpm. This rhythm lasted a estimated 45 seconds.  Then the pt converted back into his a-fib basal rhythm rate of 60's-90's.  Pt symptoms resolved as well.

## 2012-03-27 LAB — CBC
HCT: 38.2 % — ABNORMAL LOW (ref 39.0–52.0)
Hemoglobin: 12.3 g/dL — ABNORMAL LOW (ref 13.0–17.0)
MCH: 24.5 pg — ABNORMAL LOW (ref 26.0–34.0)
MCHC: 32.2 g/dL (ref 30.0–36.0)
MCV: 75.9 fL — ABNORMAL LOW (ref 78.0–100.0)
RDW: 16.2 % — ABNORMAL HIGH (ref 11.5–15.5)

## 2012-03-27 LAB — BASIC METABOLIC PANEL
BUN: 24 mg/dL — ABNORMAL HIGH (ref 6–23)
Creatinine, Ser: 1.98 mg/dL — ABNORMAL HIGH (ref 0.50–1.35)
GFR calc Af Amer: 34 mL/min — ABNORMAL LOW (ref >90)
GFR calc non Af Amer: 30 mL/min — ABNORMAL LOW (ref >90)
Glucose, Bld: 88 mg/dL (ref 70–99)

## 2012-03-27 NOTE — Progress Notes (Signed)
Utilization review completed.  

## 2012-03-27 NOTE — Consult Note (Signed)
ELECTROPHYSIOLOGY CONSULT NOTE  Patient ID: [name] MRN: [id number], DOB/AGE: [birth year]   Admit date: 03/26/2012 Date of Consult: 03/27/2012  Primary Physician: Adella Hare, MD Primary Cardiologist: Terald Sleeper, MD [facility]) Reason for Consultation: Bradycardia  History of Present Illness [name] is an 79 year old gentleman with CAD s/p PCI 2001, NSTEMI with subsequent 3V CABG 10/2011, post-op AF, preserved LV function, HTN and dyslipidemia who was admitted yesterday after an episode of weakness and near syncope during cardiac rehab. [name] reports feeling fine, like his usual self when he started rehab yesterday. He had already completed his exercises on the rowing and stair climb machines and was walking on the treadmill when he began to feel quite fatigued 4 minutes into his workout. He thought he could just work through it but a couple of minutes later his fatigue worsened significantly and he then became dizzy, feeling he was going to pass out. He denies CP, SOB or palpitations. He has never had symptoms like this before. He decided to stop the treadmill and make his way to the nearest chair. During this time, the cardiac rehab nurse noted his heart rate was 29 bpm. While seated he states he could hear the nurses talking to him but it felt like he "was in a dream." He denies full LOC or injury. He is currently taking metoprolol and had taken his usual medications prior to rehab, as he normally does.   On admission, there were strips provided by cardiac rehab documenting transient CHB, 2:1 AV block and junctional bradycardia. His CEs are negative. He has not had any further bradycardia while here. His metoprolol is being held.   Past Medical History Past Medical History  Diagnosis Date  . Hypertension     well-controlled.  [name] CAD (coronary artery disease)     a. S/P stenting to mid RCA, prox PDA 06/1999. b. NSTEMI/CABG x 3 in 10/2011 with LIMA to LAD, SVG to PDA, and  SVG to OM1.   . CKD (chronic kidney disease), stage III     a. stable with a creatinine around 1.9-2.0 followed by nephrology.  . Dyslipidemia   . Neck injury     a. C3-C4 and C4-C5 foraminal narrowing, severe  . Renal artery stenosis     a. 50% by cath 2001  . Pulmonary nodule     a. felt to be noncancerous.  Status post followup CT scan 4 mm and stable.  . Chronic UTI     a. Followed by Dr. Risa Grill - colonized/asymptomatic - not on abx  . Atrial fibrillation     a. Post-op from CABG.  [name] Acute superficial venous thrombosis of lower extremity     a. RLE after CABG, neg dopp for DVT.  [name] Prostate cancer     a. 2001 s/p TURP.    Past Surgical History Past Surgical History  Procedure Laterality Date  . S/p ptca    . Cardia catherization  07-07-99  . Peripheral vascular catherization  11-24-03  . Renal circulation  10-01-03  . Stress cardiolite  05-04-05    spring 09-negative except for apical thinning, EF 68%  . Edg  07-17-1994  . Flexible sigmoidoscopy  11-03-1997  . Prostatectomy    . Lumbar spinal disk and neck fusion surgery    . Stents      X 2  . Coronary artery bypass graft  10/19/2011    Procedure: CORONARY ARTERY BYPASS GRAFTING (CABG);  Surgeon: Gaye Pollack, MD;  Location: [facility] OR;  Service: Open Heart Surgery;  Laterality: N/A;  times three using Left Internal Mammary Artery and Right Greater Saphenouse Vein Graft harvested Endoscopically  . Cardiac surgery  10/18/12    open heart surgery  . Colonoscopy  04/12/07     Allergies/Intolerances Allergies  Allergen Reactions  . Amoxicillin Nausea Only  . Aspirin     REACTION: upset stomach; tolerates coated aspirin  . Atarax (Hydroxyzine Hcl)   . Ciprofloxacin Nausea Only  . Codeine     REACTION: Stomach upset  . Hydrocodone Nausea Only  . Macrobid (Nitrofurantoin Macrocrystal) Nausea Only  . Niacin   . Niacin-Lovastatin Er     Unsure of reaction. Taking simvastatin at home without problems  . Nitrofurantoin   .  Trimox (Amoxicillin Trihydrate)     Inpatient Medications . amLODipine  5 mg Oral Daily  . aspirin  325 mg Oral Daily  . folic acid  1 mg Oral Daily  . heparin  5,000 Units Subcutaneous Q8H  . potassium chloride  10 mEq Oral Daily  . simvastatin  40 mg Oral QHS  . sodium chloride  3 mL Intravenous Q12H    Family History Family History  Problem Relation Age of Onset  . Coronary artery disease Father     died @ 27  . Cancer Brother     Bladder  . Diabetes Neg Hx   . Prostate cancer Brother   . Nephritis Brother     died @ age 89.  . Other Brother     cerebral hemorrhage - died @ 15  . Other Mother     cerebral hemorrhage - died @ 90  . Heart disease Brother   . Arthritis Mother   . Breast cancer Other     niece x 2     Social History Social History  . Marital Status: Married   Occupational History  . 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     Social History Main Topics  . Smoking status: Never Smoker   . Smokeless tobacco: Never Used  . Alcohol Use: Yes     Comment: Rarely  . Drug Use: No  . Sexually Active: Not on file   Social History Narrative   HSG, 1 year college.  married '52 - 3 years, divorced; married '56 - 3 years divorced; married '65-12 yrs - divorced; married '75 -. 1 son '57; 1 daughter - '53; 1 grandchild.  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  Very happily married.  End of life care: yes CPR, no long term mechanical ventilation, no heroic measures.     Review of Systems General: No chills, fever, night sweats or weight changes  Cardiovascular:  No chest pain, dyspnea on exertion, edema, orthopnea, palpitations, paroxysmal nocturnal dyspnea Dermatological: No rash, lesions or masses Respiratory: No cough, dyspnea Urologic: No hematuria, dysuria Abdominal: No nausea, vomiting, diarrhea, bright red blood per rectum,  melena, or hematemesis Neurologic: No visual changes, weakness, changes in mental status All other systems reviewed and are otherwise negative except as noted above.  Physical Exam Blood pressure 123/62, pulse 62, temperature 98.4 F (36.9 C), temperature source Oral, resp. rate 17, height 6' (1.829 m), weight 172 lb 12.8 oz (78.382 kg), SpO2 96.00%.  General: Well developed, well appearing 80 year old male in no acute  distress. HEENT: Normocephalic, atraumatic. EOMs intact. Sclera nonicteric. Oropharynx clear.  Neck: Supple without bruits. No JVD. Lungs: Respirations regular and unlabored, CTA bilaterally. No wheezes, rales or rhonchi. Heart: RRR. S1, S2 present. No murmurs, rub, S3 or S4. Abdomen: Soft, non-tender, non-distended. BS present x 4 quadrants. No hepatosplenomegaly.  Extremities: No clubbing, cyanosis or edema. DP/PT/Radials 2+ and equal bilaterally. Psych: Normal affect. Neuro: Alert and oriented X 3. Moves all extremities spontaneously. Musculoskeletal: No kyphosis. Skin: Intact. Warm and dry. No rashes or petechiae in exposed areas.   Labs  Recent Labs  03/26/12 1346 03/26/12 2056 03/27/12 0228 03/27/12 0855  TROPONINI <0.30 <0.30 <0.30 <0.30   Lab Results  Component Value Date   WBC 5.5 03/27/2012   HGB 12.3* 03/27/2012   HCT 38.2* 03/27/2012   MCV 75.9* 03/27/2012   PLT 158 03/27/2012    Recent Labs Lab 03/26/12 1346  03/27/12 0228  NA 136  --  139  K 4.3  --  4.5  CL 103  --  107  CO2 23  --  24  BUN 29*  --  24*  CREATININE 2.01*  < > 1.98*  CALCIUM 9.2  --  8.8  PROT 7.0  --   --   BILITOT 0.7  --   --   ALKPHOS 71  --   --   ALT 14  --   --   AST 19  --   --   GLUCOSE 98  --  88  < > = values in this interval not displayed. Lab Results  Component Value Date   CHOL 96 01/17/2012   HDL 32.80* 01/17/2012   LDLCALC 35 01/17/2012   TRIG 139.0 01/17/2012   Recent Labs  03/26/12 2056  TSH 2.074   Recent Labs  03/26/12 1346  INR 1.06     Radiology/Studies Dg Chest Portable 1 View  03/26/2012  *RADIOLOGY REPORT*  Clinical Data: Bradycardia.  PORTABLE CHEST - 1 VIEW  Comparison:  Chest x-ray 11/13/2011.  Findings: Lung volumes are normal.  No consolidative airspace disease.  No pleural effusions.  No pneumothorax.  No pulmonary nodule or mass noted.  Pulmonary vasculature and the cardiomediastinal silhouette are within normal limits. Atherosclerosis in the thoracic aorta.  Status post sternotomy for CABG.  IMPRESSION: 1. No radiographic evidence of acute cardiopulmonary disease. 2.  Atherosclerosis.   Original Report Authenticated By: Vinnie Langton, M.D.     Cardiac catheterization 10/2011 Procedural Findings:  Hemodynamics:  AO 88/49 with a mean of 69 mmHg  LV 88/5 mmHg  Coronary angiography:  Coronary dominance: right  Left mainstem: There is mild tapering of the distal left main of 20%. The left coronary that trifurcates into the LAD, intermediate, and circumflex vessels.  Left anterior descending (LAD): The LAD has moderate diffuse disease in the proximal vessel up to 70%. The mid to distal LAD is diffusely and severely diseased up to 90%. This is a long segment of disease. The first diagonal has a 50% disease proximally.  The ramus intermediate branch is moderate in size and without significant disease.  Left circumflex (LCx): The left circumflex coronary has a 7080% stenosis proximally. The circumflex gives rise to 2 marginal branches.  Right coronary artery (RCA): The right coronary is a dominant vessel. The proximal vessel there is only mild disease up to 30%. The stent in the mid vessel is widely patent. Following the stent however there is a long segment of disease of the proximal up to 99%. There  is TIMI grade 2 flow distally. Prior to the bifurcation of the PDA and posterior lateral branches there is a 60% stenosis. The stent in the PDA is widely patent. The posterior lateral branch of the right coronary has an 80%  stenosis.  Left ventriculography: Not performed  Final Conclusions:  1. Severe three-vessel obstructive coronary disease. There has been significant progression of disease compared to 2009. CT surgery consult.   Echocardiogram 10/2011 Study Conclusions - Left ventricle: The cavity size was normal. Wall thickness was normal. Systolic function was normal. The estimated ejection fraction was in the range of 50% to 55%. There was an increased relative contribution of atrial contraction to ventricular filling. - Left atrium: The atrium was mildly dilated.    12-lead ECG on admission shows sinus bradycardia with normal intervals at 56 bpm Telemetry shows normal sinus rhythm; no arrhythmias while here  Review of strips from cardiac rehab -  documenting transient CHB, 2:1 AV block and junctional bradycardia   Assessment and Plan 1. Symptomatic bradycardia with documented transient CHB, 2:1 AV block and junctional bradycardia 2. CAD s/p 3-vessel CABG 10/2011 3. Preserved LV function 4. Atrial fibrillation, post op CABG [name] presents with symptomatic bradycardia. Given his history of CAD, continuation of BB therapy will be beneficial; therefore, he meets criteria for PPM implantation. Indications for PPM implantation were reviewed with him and his wife in detail. Alternatives to PPM implantation were also reviewed. [name] states he would like to discontinue his BB to see if he has recurrent bradycardia off AV nodal blocking medication. We will keep him for observation and if he has no further bradycardia, will arrange an event monitor and follow-up with Dr. Rayann Heman in 4-6 weeks.  Dr. Rayann Heman was in to interview and examine the patient. Please see recommendations below. Signed, Ileene Hutchinson, PA-C 03/27/2012, 2:06 PM   I have seen, examined the patient, and reviewed the above assessment and plan.  Changes to above are made where necessary.  The patient has mobitz II second  degree AV block. He has baseline 1st degree AV block and LAHB.  I am concerned that he has degenerative conduction system disease and will benefit from PPM implantation long term.  He requires beta blocker therapy for treatment of CAD. Risks, benefits, alternatives to pacemaker implantation were discussed in detail with the patient today.  At this time, he is clear that he would like to avoid PPM.  He would prefer stopping metoprolol and proceeding with a conservative strategy of watchful waiting.  I will therefore continue to observe overnight on telemetry.  If he has further AV block off of beta blocker therapy, then he really does require PPM.  If he does not, then he may be discharged with an event monitor.  I have instructed him to not drive.   Co Sign: Thompson Grayer, MD 03/27/2012 5:56 PM

## 2012-03-28 ENCOUNTER — Encounter (HOSPITAL_COMMUNITY)
Admission: EM | Disposition: A | Discharge status: Home / Self Care | Payer: Self-pay | Source: Home / Self Care | Attending: Emergency Medicine

## 2012-03-28 ENCOUNTER — Encounter (HOSPITAL_COMMUNITY): Admission: RE | Admit: 2012-03-28 | Payer: Medicare Other | Source: Ambulatory Visit

## 2012-03-28 DIAGNOSIS — I441 Atrioventricular block, second degree: Secondary | ICD-10-CM

## 2012-03-28 HISTORY — PX: PACEMAKER INSERTION: SHX728

## 2012-03-28 HISTORY — PX: PERMANENT PACEMAKER INSERTION: SHX5480

## 2012-03-28 LAB — SURGICAL PCR SCREEN
MRSA, PCR: NEGATIVE
Staphylococcus aureus: POSITIVE — AB

## 2012-03-28 SURGERY — PERMANENT PACEMAKER INSERTION
Anesthesia: LOCAL

## 2012-03-28 MED ORDER — SODIUM CHLORIDE 0.9 % IV SOLN
250.0000 mL | INTRAVENOUS | Status: DC
  Administered 2012-03-28: 250 mL via INTRAVENOUS

## 2012-03-28 MED ORDER — FENTANYL CITRATE 0.05 MG/ML IJ SOLN
INTRAMUSCULAR | Status: AC
  Filled 2012-03-28: qty 2

## 2012-03-28 MED ORDER — MIDAZOLAM HCL 5 MG/5ML IJ SOLN
INTRAMUSCULAR | Status: AC
  Filled 2012-03-28: qty 5

## 2012-03-28 MED ORDER — SODIUM CHLORIDE 0.9 % IJ SOLN
3.0000 mL | Freq: Two times a day (BID) | INTRAMUSCULAR | Status: DC
  Administered 2012-03-28: 3 mL via INTRAVENOUS

## 2012-03-28 MED ORDER — METOPROLOL SUCCINATE ER 25 MG PO TB24
25.0000 mg | ORAL_TABLET | Freq: Every day | ORAL | Status: DC
  Administered 2012-03-28 – 2012-03-29 (×2): 25 mg via ORAL
  Filled 2012-03-28 (×2): qty 1

## 2012-03-28 MED ORDER — GENTAMICIN SULFATE 40 MG/ML IJ SOLN
80.0000 mg | INTRAMUSCULAR | Status: AC
  Administered 2012-03-28: 80 mg
  Filled 2012-03-28: qty 2

## 2012-03-28 MED ORDER — LIDOCAINE HCL (PF) 1 % IJ SOLN
INTRAMUSCULAR | Status: AC
  Filled 2012-03-28: qty 60

## 2012-03-28 MED ORDER — SODIUM CHLORIDE 0.9 % IJ SOLN: 3.0000 mL | INTRAMUSCULAR | Status: DC | PRN

## 2012-03-28 MED ORDER — CHLORHEXIDINE GLUCONATE 4 % EX LIQD
60.0000 mL | Freq: Once | CUTANEOUS | Status: AC
  Filled 2012-03-28: qty 15

## 2012-03-28 MED ORDER — SODIUM CHLORIDE 0.45 % IV SOLN
INTRAVENOUS | Status: DC
  Administered 2012-03-28: 12:00:00 via INTRAVENOUS

## 2012-03-28 MED ORDER — ACETAMINOPHEN 325 MG PO TABS
325.0000 mg | ORAL_TABLET | ORAL | Status: DC | PRN
  Administered 2012-03-28: 325 mg via ORAL

## 2012-03-28 MED ORDER — VANCOMYCIN HCL IN DEXTROSE 1-5 GM/200ML-% IV SOLN
1000.0000 mg | Freq: Two times a day (BID) | INTRAVENOUS | Status: AC
  Administered 2012-03-29: 1000 mg via INTRAVENOUS
  Filled 2012-03-28: qty 200

## 2012-03-28 MED ORDER — CHLORHEXIDINE GLUCONATE 4 % EX LIQD
60.0000 mL | Freq: Once | CUTANEOUS | Status: AC
  Administered 2012-03-28: 4 via TOPICAL

## 2012-03-28 MED ORDER — VANCOMYCIN HCL IN DEXTROSE 1-5 GM/200ML-% IV SOLN
1000.0000 mg | INTRAVENOUS | Status: DC
  Administered 2012-03-28: 1000 mg via INTRAVENOUS
  Filled 2012-03-28: qty 200

## 2012-03-28 MED ORDER — SODIUM CHLORIDE 0.9 % IV SOLN: 250.0000 mL | INTRAVENOUS | Status: DC

## 2012-03-28 MED ORDER — ONDANSETRON HCL 4 MG/2ML IJ SOLN: 4.0000 mg | Freq: Four times a day (QID) | INTRAMUSCULAR | Status: DC | PRN

## 2012-03-28 NOTE — Op Note (Signed)
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 Left upper extremity venography.   2. Pacemaker implantation.     INTRODUCTION: [name] is a 79 y.o. male  with a history of symptomatic mobitz II second degree AV block who presents today for pacemaker implantation.  The patient developed exertional dizziness and presyncope abruptly during cardiac rehab.  Telemetry at the time documented mobitz II second degree AV block with transient complete heart block.  He has a first degree AV block and LAHB at baseline.  He requires beta blocker therapy long term due to coronary artery disease.   No reversible causes have been identified.  He is felt to have degenerative conduction system disease with an unreliable conduction system.  The patient therefore presents today for pacemaker implantation.     DESCRIPTION OF PROCEDURE:  Informed written consent was obtained, and the patient was brought to the electrophysiology lab in a fasting state.  The patient required no sedation for the procedure today.  The patients left chest was prepped and draped in the usual sterile fashion by the EP lab staff. The skin overlying the left deltopectoral region was infiltrated with lidocaine for local analgesia.  A 4-cm incision was made over the left deltopectoral region.  A left subcutaneous pacemaker pocket was fashioned using a combination of sharp and blunt dissection. Electrocautery was required to assure hemostasis.    Left Upper Extremity Venography: A venogram of the left upper extremity was performed, which revealed a large left cephalic vein, which emptied into a large left subclavian vein.  The left axillary vein was moderate in size.    RA/RV Lead Placement: The left axillary vein was therefore dcannulated.  Through the left axillary vein, a Medtronic model  [id number] (serial number [id number]) right atrial lead and a Medtronic model [id number] (serial number [id number]) right ventricular lead were advanced with fluoroscopic visualization into the right atrial appendage and right ventricular apex positions respectively.  Initial atrial lead P- waves measured 1.9 mV with impedance of 712 ohms and a threshold of 1.2 V at 0.5 msec.  Right ventricular lead R-waves measured 30 mV with an impedance of 740 ohms and a threshold of 0.5 V at 0.5 msec.  Both leads   were secured to the pectoralis fascia using #2-0 silk over the suture sleeves.   Device Placement:  The leads were then connected to a Medtronic Adapta L model ADDRL 1 (serial number [id number]) pacemaker.  The pocket was irrigated with copious gentamicin solution.  The pacemaker was then placed into the pocket.  The pocket was then closed in 2 layers with 2.0 Vicryl suture for the subcutaneous and subcuticular layers.  Steri- Strips and a sterile dressing were then applied.  There were no early apparent complications.     CONCLUSIONS:   1. Successful implantation of a Medtronic Adapta L dual-chamber pacemaker for symptomatic mobitz II second degree AV block  2. No early apparent complications.           Thompson Grayer, MD 03/28/2012 3:53 PM

## 2012-03-28 NOTE — Progress Notes (Signed)
Patient: [name] Date of Encounter: 03/28/2012, 9:06 AM Admit date: 03/26/2012     Subjective  [name] is doing well and is without complaints this AM. He is eager to proceed with PPM.   Objective  Physical Exam: Vitals: BP 144/70  Pulse 61  Temp(Src) 98.3 F (36.8 C) (Oral)  Resp 16  Ht 6' (1.829 m)  Wt 175 lb 1.6 oz (79.425 kg)  BMI 23.74 kg/m2  SpO2 96% General: Well developed, well appearing 79 year old male in no acute distress. Neck: Supple. JVD not elevated. Lungs: Clear bilaterally to auscultation without wheezes, rales, or rhonchi. Breathing is unlabored. Heart: RRR S1 S2 without murmurs, rubs, or gallops.  Abdomen: Soft, non-distended. Extremities: No clubbing or cyanosis. No edema.  Distal pedal pulses are 2+ and equal bilaterally. Neuro: Alert and oriented X 3. Moves all extremities spontaneously. No focal deficits.  Intake/Output:  Intake/Output Summary (Last 24 hours) at 03/28/12 0906 Last data filed at 03/27/12 1200  Gross per 24 hour  Intake    720 ml  Output      0 ml  Net    720 ml    Inpatient Medications:  . amLODipine  5 mg Oral Daily  . aspirin  325 mg Oral Daily  . folic acid  1 mg Oral Daily  . heparin  5,000 Units Subcutaneous Q8H  . potassium chloride  10 mEq Oral Daily  . simvastatin  40 mg Oral QHS  . sodium chloride  3 mL Intravenous Q12H    Labs:  Recent Labs  03/26/12 1346 03/26/12 2056 03/27/12 0228  NA 136  --  139  K 4.3  --  4.5  CL 103  --  107  CO2 23  --  24  GLUCOSE 98  --  88  BUN 29*  --  24*  CREATININE 2.01* 1.95* 1.98*  CALCIUM 9.2  --  8.8  MG  --  2.3  --     Recent Labs  03/26/12 1346  AST 19  ALT 14  ALKPHOS 71  BILITOT 0.7  PROT 7.0  ALBUMIN 4.0    Recent Labs  03/26/12 1346 03/26/12 2056 03/27/12 0228  WBC 7.1 4.9 5.5  NEUTROABS 5.5  --   --   HGB 12.6* 12.2* 12.3*  HCT 38.6* 37.3* 38.2*  MCV 75.4* 76.0* 75.9*  PLT 176 177 158    Recent Labs  03/26/12 1346  03/26/12 2056 03/27/12 0228 03/27/12 0855  TROPONINI <0.30 <0.30 <0.30 <0.30    Recent Labs  03/26/12 2056  TSH 2.074    Recent Labs  03/26/12 1346  INR 1.06    Radiology/Studies: Dg Chest Portable 1 View  03/26/2012  *RADIOLOGY REPORT*  Clinical Data: Bradycardia.  PORTABLE CHEST - 1 VIEW  Comparison:  Chest x-ray 11/13/2011.  Findings: Lung volumes are normal.  No consolidative airspace disease.  No pleural effusions.  No pneumothorax.  No pulmonary nodule or mass noted.  Pulmonary vasculature and the cardiomediastinal silhouette are within normal limits. Atherosclerosis in the thoracic aorta.  Status post sternotomy for CABG.  IMPRESSION: 1. No radiographic evidence of acute cardiopulmonary disease. 2.  Atherosclerosis.   Original Report Authenticated By: Vinnie Langton, M.D.     12-lead ECG on admission shows sinus bradycardia with normal intervals at 56 bpm  Telemetry shows normal sinus rhythm; no arrhythmias while here  Review of strips from cardiac rehab - documenting transient CHB, 2:1 AV block and junctional bradycardia  Assessment and Plan  1. Symptomatic bradycardia with documented transient CHB, 2:1 AV block and junctional bradycardia  2. CAD s/p 3-vessel CABG 10/2011  3. Preserved LV function  4. Atrial fibrillation, post op CABG [name] presents with symptomatic bradycardia. Given his history of CAD, continuation of BB therapy will be beneficial; therefore, he meets criteria for PPM implantation. Indications for PPM implantation were reviewed with him and his wife in detail. Risks, benefits and alternatives to PPM implantation were discussed in detail with the patient today. These risks include, but are not limited to, bleeding, infection, pneumothorax, perforation, tamponade, vascular damage, renal failure, lead dislodgement, MI, stroke and death. [name] expressed verbal understanding and agrees to proceed.   Signed, EDMISTEN, BROOKE PA-C   I  have seen, examined the patient, and reviewed the above assessment and plan.  Changes to above are made where necessary.  The patient reports significant fear of progressive AV conduction disease.  Given his mobitz II AV block and advanced age, I am concerned that his conduction system is not reliable.  He requires beta blocker therapy long term for his CAD.  I would therefore recommend PPM implant at this time. We will proceed with pacemaker implantation later today.  Co Sign: Thompson Grayer, MD 03/28/2012 11:29 AM

## 2012-03-28 NOTE — Progress Notes (Signed)
Utilization review completed.  

## 2012-03-29 ENCOUNTER — Observation Stay (HOSPITAL_COMMUNITY): Payer: Medicare Other

## 2012-03-29 ENCOUNTER — Encounter (HOSPITAL_COMMUNITY): Payer: Self-pay | Admitting: Physician Assistant

## 2012-03-29 LAB — BASIC METABOLIC PANEL
BUN: 24 mg/dL — ABNORMAL HIGH (ref 6–23)
Creatinine, Ser: 1.96 mg/dL — ABNORMAL HIGH (ref 0.50–1.35)
GFR calc Af Amer: 35 mL/min — ABNORMAL LOW (ref >90)
GFR calc non Af Amer: 30 mL/min — ABNORMAL LOW (ref >90)

## 2012-03-29 MED ORDER — YOU HAVE A PACEMAKER BOOK
Freq: Once | Status: AC
  Administered 2012-03-29: 05:00:00
  Filled 2012-03-29: qty 1

## 2012-03-29 NOTE — Progress Notes (Signed)
SUBJECTIVE: The patient is doing well today.  At this time, he denies chest pain, shortness of breath, or any new concerns.  [name] amLODipine  5 mg Oral Daily  . aspirin  325 mg Oral Daily  . folic acid  1 mg Oral Daily  . metoprolol succinate  25 mg Oral Daily  . potassium chloride  10 mEq Oral Daily  . simvastatin  40 mg Oral QHS  . sodium chloride  3 mL Intravenous Q12H      OBJECTIVE: Physical Exam: Filed Vitals:   03/28/12 1400 03/28/12 1749 03/29/12 0542 03/29/12 0938  BP: 151/69 127/63 158/74 133/66  Pulse: 60 62 59 61  Temp: 97.9 F (36.6 C)  97.8 F (36.6 C)   TempSrc: Oral  Oral   Resp: 18  17   Height:      Weight:   174 lb 11.2 oz (79.243 kg)   SpO2: 98%  94%     Intake/Output Summary (Last 24 hours) at 03/29/12 0949 Last data filed at 03/29/12 [id number]  Gross per 24 hour  Intake    240 ml  Output    700 ml  Net   -460 ml    Telemetry reveals sinus rhythm  GEN- The patient is well appearing, alert and oriented x 3 today.   Head- normocephalic, atraumatic Eyes-  Sclera clear, conjunctiva pink Ears- hearing intact Oropharynx- clear Neck- supple, no JVP Lymph- no cervical lymphadenopathy Lungs- Clear to ausculation bilaterally, normal work of breathing Heart- Regular rate and rhythm, no murmurs, rubs or gallops, PMI not laterally displaced GI- soft, NT, ND, + BS Extremities- no clubbing, cyanosis, or edema Skin- no rash or lesion Psych- euthymic mood, full affect Neuro- strength and sensation are intact Pacemaker pocket without hematoma  LABS: Basic Metabolic Panel:  Recent Labs  03/26/12 2056 03/27/12 0228 03/29/12 0452  NA  --  139 139  K  --  4.5 3.9  CL  --  107 104  CO2  --  24 23  GLUCOSE  --  88 103*  BUN  --  24* 24*  CREATININE 1.95* 1.98* 1.96*  CALCIUM  --  8.8 8.8  MG 2.3  --   --    Liver Function Tests:  Recent Labs  03/26/12 1346  AST 19  ALT 14  ALKPHOS 71  BILITOT 0.7  PROT 7.0  ALBUMIN 4.0   No results found  for this basename: LIPASE, AMYLASE,  in the last 72 hours CBC:  Recent Labs  03/26/12 1346 03/26/12 2056 03/27/12 0228  WBC 7.1 4.9 5.5  NEUTROABS 5.5  --   --   HGB 12.6* 12.2* 12.3*  HCT 38.6* 37.3* 38.2*  MCV 75.4* 76.0* 75.9*  PLT 176 177 158   Cardiac Enzymes:  Recent Labs  03/26/12 2056 03/27/12 0228 03/27/12 0855  TROPONINI <0.30 <0.30 <0.30   BNP: No components found with this basename: POCBNP,  D-Dimer: No results found for this basename: DDIMER,  in the last 72 hours Hemoglobin A1C: No results found for this basename: HGBA1C,  in the last 72 hours Fasting Lipid Panel: No results found for this basename: CHOL, HDL, LDLCALC, TRIG, CHOLHDL, LDLDIRECT,  in the last 72 hours Thyroid Function Tests:  Recent Labs  03/26/12 2056  TSH 2.074   Anemia Panel: No results found for this basename: VITAMINB12, FOLATE, FERRITIN, TIBC, IRON, RETICCTPCT,  in the last 72 hours  RADIOLOGY: Dg Chest 2 View  03/29/2012  *RADIOLOGY REPORT*  Clinical Data:  Pacemaker placement.  CHEST - 2 VIEW  Comparison: 03/26/2012  Findings: Previous CABG.  Interval left subclavian dual lead pacemaker placement, leads extending towards the right atrium and right ventricular apex.  No pneumothorax.  Lungs clear.  Heart size normal.  No effusion.  Minimal spondylitic changes in the mid thoracic spine.  IMPRESSION:  1.  Pacemaker placement as above without pneumothorax.   Original Report Authenticated By: D. Wallace Going, MD    Dg Chest Portable 1 View  03/26/2012  *RADIOLOGY REPORT*  Clinical Data: Bradycardia.  PORTABLE CHEST - 1 VIEW  Comparison:  Chest x-ray 11/13/2011.  Findings: Lung volumes are normal.  No consolidative airspace disease.  No pleural effusions.  No pneumothorax.  No pulmonary nodule or mass noted.  Pulmonary vasculature and the cardiomediastinal silhouette are within normal limits. Atherosclerosis in the thoracic aorta.  Status post sternotomy for CABG.  IMPRESSION: 1. No  radiographic evidence of acute cardiopulmonary disease. 2.  Atherosclerosis.   Original Report Authenticated By: Vinnie Langton, M.D.     ASSESSMENT AND PLAN:  Principal Problem:   AV block, 2nd degree Active Problems:   CAD (coronary artery disease)   Renal insufficiency   S/P CABG x 3   Symptomatic bradycardia   1. Mobitz II AV block Doing well s/p PPM CXR reviewed Device interrogation is reviewed and normal (see paper chart) Routine wound care  2. CAD Resume home medicines  DC to home  Wound check in 10 days followup with me in 3 months  Follow-up with Dr Dannielle Burn as previously scheduled.  Thompson Grayer, MD 03/29/2012 9:49 AM

## 2012-03-29 NOTE — Progress Notes (Signed)
Right Lower lobe fine crackles noted on auscultation otherwise clear. Carlynn Spry RN

## 2012-03-29 NOTE — Discharge Summary (Signed)
Discharge Summary   Patient ID: [name] MRN: [id number], DOB/AGE: 1926-10-27 79 y.o. Admit date: 03/26/2012 D/C date:     03/29/2012  Primary Cardiologist: Dannielle Burn [facility])  Primary Discharge Diagnoses:  1. Symptomatic bradycardia with Mobitz II AV block - s/p Medtronic pacemaker 03/28/12 2. CAD s/p NSTEMI/CABG 10/2011, remote stenting  3. Post-op afib at time of CABG 4. CKD, baseline Cr 1.9-2 5. Microcytic anemia, improved Hgb from post-op values  Secondary Discharge Diagnoses:  1. HTN 2. Prostate CA 2001 s/p TURP 3. Dyslipidemia 4. H/o neck injury - C3-C4 and C4-C5 foraminal narrowing, severe 5. RAS 50% by cath 2001 6. Pulm nodule - per report felt to be noncancerous 7. Chronic UTI, Followed by Dr. Risa Grill - colonized/asymptomatic - not on abx 8. Superficial venous thrombosis of RLE after CABG, neg dopp for DVT  Hospital Course: [name] is an 79 y/o M with history of CAD (s/p remote mid RCA & prox LAD stenting and NSTEMI/3v CABG 10/2011), CKD baseline Cr 1.9-2, HTN, protate CA, and post-op afib at time of CABG (rx'd IV amiodarone->SR) who presented to [facility] 03/26/12 with an episode of weakness and bradycardia at cardiac rehab. It appears sometime after his f/u appt with [facility] his amiodarone was stopped. He has been doing great with cardiac rehab until the day of admission. He was able to complete his rowing and step exercises without difficulty. Subsequently, he went to do his treadmill exercise at a walking pace with incline. After 4 minutes he began to feel tired so he decreased the speed and incline. At 5 minutes he still felt fatigued and then began feeling weak and dizzy. He got off of the treadmill and sat over to the side. His HR was noted to be 28 on the monitor with documented transient CHB, 2:1 AV block and junctional bradycardia. No CP or SOB. He was reportedly gray and did not respond to verbal stimulation at first but did respond to being tapped.  He was clammy and felt bad. He did not fully lose consciousness. This has never happened to him before. No recent symptoms otherwise, including CP, SOB, presyncope, syncope, palpitations, LEE. BP 162/60 with regular pulse, o2 sat 100% on RA. He began to feel better but still felt weak so was sent to the ER for further eval. He was in NSR on arrival. K was WNL. His Toprol was held on admission and he was observed on telemetry. Given his history of CAD, continuation of BB therapy would be beneficial thus he was felt to meet criteria for pacemaker. Given his mobitz II AV block and advanced age, Dr. Rayann Heman was concerned that his conduction system is not reliable. The patient was agreeable to proceeding with pacemaker (he reported reports significant fear of progressive AV conduction disease.) He underwent successful implantation of Medtronic pacemaker yesterday. Post-op CXR was without complication. Device interrogation was reviewed and normal per Dr. Rayann Heman. He has seen and examined the patient and feels he is stable for discharge. [name] will have wound check ~10 days and f/u Jull Harral in 3 months, and f/u [facility] as scheduled. I left message on scheduler voicemail for these appts. The patient was started on norvasc for blood pressure while his Toprol was being held - we will resume home metoprolol at discharge (restarted last night) and have him follow his BP's at home.  Discharge Vitals: Blood pressure 133/66, pulse 61, temperature 97.8 F (36.6 C), temperature source Oral, resp. rate 17, height 6' (1.829 m),  weight 174 lb 11.2 oz (79.243 kg), SpO2 94.00%.  Labs: Lab Results  Component Value Date   WBC 5.5 03/27/2012   HGB 12.3* 03/27/2012   HCT 38.2* 03/27/2012   MCV 75.9* 03/27/2012   PLT 158 03/27/2012    Recent Labs Lab 03/26/12 1346  03/29/12 0452  NA 136  < > 139  K 4.3  < > 3.9  CL 103  < > 104  CO2 23  < > 23  BUN 29*  < > 24*  CREATININE 2.01*  < > 1.96*  CALCIUM 9.2  < > 8.8  PROT  7.0  --   --   BILITOT 0.7  --   --   ALKPHOS 71  --   --   ALT 14  --   --   AST 19  --   --   GLUCOSE 98  < > 103*  < > = values in this interval not displayed.  Recent Labs  03/26/12 1346 03/26/12 2056 03/27/12 0228 03/27/12 0855  TROPONINI <0.30 <0.30 <0.30 <0.30   Lab Results  Component Value Date   CHOL 96 01/17/2012   HDL 32.80* 01/17/2012   LDLCALC 35 01/17/2012   TRIG 139.0 01/17/2012    Diagnostic Studies/Procedures   Pacemaker implantation as above  Dg Chest 2 View 03/29/2012  *RADIOLOGY REPORT*  Clinical Data: Pacemaker placement.  CHEST - 2 VIEW  Comparison: 03/26/2012  Findings: Previous CABG.  Interval left subclavian dual lead pacemaker placement, leads extending towards the right atrium and right ventricular apex.  No pneumothorax.  Lungs clear.  Heart size normal.  No effusion.  Minimal spondylitic changes in the mid thoracic spine.  IMPRESSION:  1.  Pacemaker placement as above without pneumothorax.   Original Report Authenticated By: D. Wallace Going, MD    Dg Chest Portable 1 View 03/26/2012  *RADIOLOGY REPORT*  Clinical Data: Bradycardia.  PORTABLE CHEST - 1 VIEW  Comparison:  Chest x-ray 11/13/2011.  Findings: Lung volumes are normal.  No consolidative airspace disease.  No pleural effusions.  No pneumothorax.  No pulmonary nodule or mass noted.  Pulmonary vasculature and the cardiomediastinal silhouette are within normal limits. Atherosclerosis in the thoracic aorta.  Status post sternotomy for CABG.  IMPRESSION: 1. No radiographic evidence of acute cardiopulmonary disease. 2.  Atherosclerosis.   Original Report Authenticated By: Vinnie Langton, M.D.     Discharge Medications     Medication List    TAKE these medications       aspirin 325 MG buffered tablet  Take 325 mg by mouth daily.     CYANOCOBALAMIN IJ  Inject 1 mL as directed every 30 (thirty) days. Usually given between the 1st and 5th of each month     ergocalciferol 50000 UNITS capsule  Commonly  known as:  VITAMIN D2  Take 50,000 Units by mouth every 30 (thirty) days. 15 th of each month     fluticasone 50 MCG/ACT nasal spray  Commonly known as:  FLONASE  Place 1 spray into the nose 2 (two) times daily as needed. For cold symptoms     folic acid 1 MG tablet  Commonly known as:  FOLVITE  Take 1 tablet (1 mg total) by mouth daily.     metoprolol succinate 25 MG 24 hr tablet  Commonly known as:  TOPROL-XL  Take 2 times daily by mouth.     nitroGLYCERIN 0.4 MG SL tablet  Commonly known as:  NITROSTAT  Place 1 tablet (0.4 mg  total) under the tongue every 5 (five) minutes as needed for chest pain.     potassium chloride 10 MEQ tablet  Commonly known as:  K-DUR  Take 1 tablet (10 mEq total) by mouth daily.     simvastatin 20 MG tablet  Commonly known as:  ZOCOR  Take 2 tablets (40 mg total) by mouth at bedtime.        Disposition   The patient will be discharged in stable condition to home.     Discharge Orders   Future Appointments Provider Department Dept Phone   04/07/2012 11:30 AM [facility] Device [address] [place]) [phone]   04/22/2012 10:30 AM [facility] Nurse [facility] [phone]   06/30/2012 12:00 PM Thompson Grayer, MD [place]) [phone]   Future Orders Complete By Expires     Diet - low sodium heart healthy  As directed     Discharge instructions  As directed     Comments:      Please monitor your blood pressure at home. If you find it routinely runs greater than 130 on the top number or 80 on the bottom number, please call your cardiologist.    Increase activity slowly  As directed     Comments:      Please see attached sheet at end of After-Visit Summary for instructions on wound care, activity, and bathing.      Follow-up Information   Follow up with Thompson Grayer, MD. (Our office will call you for follow-up wound check in about 10 days and appointment with Dr. Rayann Heman  in 3 months. Call office if you have not heard from [place] by mid-next-week.)    Contact information:   [address] [place] [address] [phone]       Follow up with Salli Real, MD. (As scheduled)    Contact information:   [place] [address] [place] [address] [phone]         Duration of Discharge Encounter: Greater than 30 minutes including physician and PA time.  Signed, Melina Copa PA-C 03/29/2012, 10:39 AM    Thompson Grayer, MD

## 2012-03-31 ENCOUNTER — Telehealth: Payer: Self-pay | Admitting: Internal Medicine

## 2012-03-31 NOTE — Telephone Encounter (Signed)
New problem  Pt has a question about his upcoming appt w/dr allred

## 2012-03-31 NOTE — Telephone Encounter (Signed)
Spoke with patient who is questioning why his appointment to see Dr. Rayann Heman is not scheduled until June.  States Dr. Rayann Heman told him twice when he D/C'ed him on Saturday that he wants to see him in 3 weeks. Message sent to Lorenda Hatchet, scheduler for Dr. Rayann Heman.

## 2012-03-31 NOTE — Telephone Encounter (Signed)
Left patient a message after talking to Lorenda Hatchet, scheduler for Dr. Rayann Heman.  Explained to patient that wound check is done within 10-14 days after insertion and physician pacer check is done 91 days after insertion.  Instructed patient to call us if he has problems with his device prior to his appointment.

## 2012-03-31 NOTE — Telephone Encounter (Signed)
LMTCB

## 2012-04-01 ENCOUNTER — Encounter: Payer: Self-pay | Admitting: Internal Medicine

## 2012-04-01 ENCOUNTER — Telehealth: Payer: Self-pay | Admitting: Family Medicine

## 2012-04-01 NOTE — Telephone Encounter (Signed)
Attempted transitional care call, no answer.  Will attempt to call again.

## 2012-04-07 ENCOUNTER — Ambulatory Visit (INDEPENDENT_AMBULATORY_CARE_PROVIDER_SITE_OTHER): Payer: Medicare Other | Admitting: *Deleted

## 2012-04-07 ENCOUNTER — Other Ambulatory Visit: Payer: Self-pay | Admitting: Internal Medicine

## 2012-04-07 ENCOUNTER — Encounter: Payer: Self-pay | Admitting: Internal Medicine

## 2012-04-07 DIAGNOSIS — I441 Atrioventricular block, second degree: Secondary | ICD-10-CM

## 2012-04-07 LAB — PACEMAKER DEVICE OBSERVATION
AL AMPLITUDE: 5.6 mv
AL IMPEDENCE PM: 567 Ohm
BATTERY VOLTAGE: 2.8 V
RV LEAD AMPLITUDE: 31.36 mv
RV LEAD IMPEDENCE PM: 627 Ohm

## 2012-04-07 NOTE — Progress Notes (Signed)
Dual chamber pacemaker check in clinic. Normal device function. No changes made. Site well healed with no redness or swelling. ROV 06-30-12 @ 1200 with JA.

## 2012-04-10 DIAGNOSIS — Z950 Presence of cardiac pacemaker: Secondary | ICD-10-CM

## 2012-04-10 DIAGNOSIS — R911 Solitary pulmonary nodule: Secondary | ICD-10-CM | POA: Insufficient documentation

## 2012-04-10 DIAGNOSIS — N289 Disorder of kidney and ureter, unspecified: Secondary | ICD-10-CM | POA: Insufficient documentation

## 2012-04-10 HISTORY — DX: Presence of cardiac pacemaker: Z95.0

## 2012-04-22 ENCOUNTER — Ambulatory Visit (INDEPENDENT_AMBULATORY_CARE_PROVIDER_SITE_OTHER): Payer: Medicare Other

## 2012-04-22 DIAGNOSIS — E538 Deficiency of other specified B group vitamins: Secondary | ICD-10-CM

## 2012-04-22 MED ORDER — CYANOCOBALAMIN 1000 MCG/ML IJ SOLN
1000.0000 ug | Freq: Once | INTRAMUSCULAR | Status: AC
  Administered 2012-04-22: 1000 ug via INTRAMUSCULAR

## 2012-05-03 DIAGNOSIS — I4891 Unspecified atrial fibrillation: Secondary | ICD-10-CM

## 2012-05-03 DIAGNOSIS — I442 Atrioventricular block, complete: Secondary | ICD-10-CM | POA: Insufficient documentation

## 2012-05-03 HISTORY — DX: Unspecified atrial fibrillation: I48.91

## 2012-05-03 HISTORY — DX: Atrioventricular block, complete: I44.2

## 2012-05-05 ENCOUNTER — Encounter: Payer: Medicare Other | Admitting: Internal Medicine

## 2012-05-07 ENCOUNTER — Telehealth: Payer: Self-pay | Admitting: Internal Medicine

## 2012-05-07 NOTE — Telephone Encounter (Signed)
New problem    Pt has general questions/information he wants to discuss since dr allred did his pacemaker

## 2012-05-07 NOTE — Telephone Encounter (Signed)
Spoke with patient and let him know it was fine for him to start rehab

## 2012-05-19 ENCOUNTER — Ambulatory Visit (INDEPENDENT_AMBULATORY_CARE_PROVIDER_SITE_OTHER): Payer: Medicare Other

## 2012-05-19 DIAGNOSIS — E538 Deficiency of other specified B group vitamins: Secondary | ICD-10-CM

## 2012-05-19 MED ORDER — CYANOCOBALAMIN 1000 MCG/ML IJ SOLN
1000.0000 ug | Freq: Once | INTRAMUSCULAR | Status: AC
  Administered 2012-05-19: 1000 ug via INTRAMUSCULAR

## 2012-06-19 ENCOUNTER — Ambulatory Visit (INDEPENDENT_AMBULATORY_CARE_PROVIDER_SITE_OTHER): Payer: Medicare Other

## 2012-06-19 DIAGNOSIS — E538 Deficiency of other specified B group vitamins: Secondary | ICD-10-CM

## 2012-06-19 MED ORDER — CYANOCOBALAMIN 1000 MCG/ML IJ SOLN
1000.0000 ug | Freq: Once | INTRAMUSCULAR | Status: AC
  Administered 2012-06-19: 1000 ug via INTRAMUSCULAR

## 2012-06-30 ENCOUNTER — Encounter: Payer: Self-pay | Admitting: Internal Medicine

## 2012-06-30 ENCOUNTER — Ambulatory Visit (INDEPENDENT_AMBULATORY_CARE_PROVIDER_SITE_OTHER): Payer: Medicare Other | Admitting: Internal Medicine

## 2012-06-30 VITALS — BP 163/76 | HR 60 | Ht 72.0 in | Wt 174.2 lb

## 2012-06-30 DIAGNOSIS — I10 Essential (primary) hypertension: Secondary | ICD-10-CM

## 2012-06-30 DIAGNOSIS — I441 Atrioventricular block, second degree: Secondary | ICD-10-CM

## 2012-06-30 DIAGNOSIS — I2510 Atherosclerotic heart disease of native coronary artery without angina pectoris: Secondary | ICD-10-CM

## 2012-06-30 LAB — PACEMAKER DEVICE OBSERVATION
AL IMPEDENCE PM: 501 Ohm
AL THRESHOLD: 0.75 V
BATTERY VOLTAGE: 2.8 V
RV LEAD AMPLITUDE: 31.36 mv
RV LEAD IMPEDENCE PM: 620 Ohm

## 2012-06-30 NOTE — Patient Instructions (Addendum)
Your physician wants you to follow-up in: March with Dr Rayann Heman Dennis Bast will receive a reminder letter in the mail two months in advance. If you don't receive a letter, please call our office to schedule the follow-up appointment.

## 2012-06-30 NOTE — Progress Notes (Signed)
PCP: Adella Hare, MD Primary Cardiologist:  Dr Mike  Kayzen H [name] is a 79 y.o. male who presents today for routine electrophysiology followup.  Since his recent pacemaker implantation, the patient reports doing very well.  His dizziness and presyncope have resolved.  Today, he denies symptoms of palpitations, chest pain, shortness of breath,  lower extremity edema,  or syncope.  The patient is otherwise without complaint today.   Past Medical History  Diagnosis Date  . Hypertension     well-controlled.  [name] CAD (coronary artery disease)     a. S/P stenting to mid RCA, prox PDA 06/1999. b. NSTEMI/CABG x 3 in 10/2011 with LIMA to LAD, SVG to PDA, and SVG to OM1.   . CKD (chronic kidney disease), stage III     a. stable with a creatinine around 1.9-2.0 followed by nephrology.  . Dyslipidemia   . Neck injury     a. C3-C4 and C4-C5 foraminal narrowing, severe  . Renal artery stenosis     a. 50% by cath 2001  . Pulmonary nodule     a. felt to be noncancerous.  Status post followup CT scan 4 mm and stable.  . Chronic UTI     a. Followed by Dr. Risa Grill - colonized/asymptomatic - not on abx  . Atrial fibrillation     a. Post-op from CABG, on amiodarone temporarily, d/c'd 12/2011  . Acute superficial venous thrombosis of lower extremity     a. RLE after CABG, neg dopp for DVT.  [name] Prostate cancer     a. 2001 s/p TURP.  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 S/p ptca    . Cardia catherization  07-07-99  . Peripheral vascular catherization  11-24-03  . Renal circulation  10-01-03  . Stress cardiolite  05-04-05    spring 09-negative except for apical thinning, EF 68%  . Edg  07-17-1994  . Flexible sigmoidoscopy  11-03-1997  . Prostatectomy    . Lumbar spinal disk and neck fusion surgery    . Stents      X 2  . Coronary artery bypass graft  10/19/2011    Procedure: CORONARY ARTERY BYPASS  GRAFTING (CABG);  Surgeon: Gaye Pollack, MD;  Location: [facility];  Service: Open Heart Surgery;  Laterality: N/A;  times three using Left Internal Mammary Artery and Right Greater Saphenouse Vein Graft harvested Endoscopically  . Cardiac surgery  10/18/12    open heart surgery  . Colonoscopy  04/12/07  . Pacemaker insertion  03/28/12    PPM implanted for mobitz II AV block    Current Outpatient Prescriptions  Medication Sig Dispense Refill  . aspirin 325 MG buffered tablet Take 325 mg by mouth daily.        . CYANOCOBALAMIN IJ Inject 1 mL as directed every 30 (thirty) days. Usually given between the 1st and 5th of each month      . ergocalciferol (VITAMIN D2) 50000 UNITS capsule Take 50,000 Units by mouth every 30 (thirty) days. 15 th of each month      . fluticasone (FLONASE) 50 MCG/ACT nasal spray Place 1 spray into the nose 2 (two) times daily as needed. For cold symptoms      . folic acid (FOLVITE) 1 MG tablet Take 1 tablet (1 mg total) by mouth daily.  90 tablet  3  . metoprolol succinate (TOPROL-XL) 25 MG 24  hr tablet Take 2 times daily by mouth.  180 tablet  3  . nitroGLYCERIN (NITROSTAT) 0.4 MG SL tablet Place 1 tablet (0.4 mg total) under the tongue every 5 (five) minutes as needed for chest pain.  15 tablet  3  . potassium chloride (K-DUR) 10 MEQ tablet Take 1 tablet (10 mEq total) by mouth daily.  90 tablet  3  . simvastatin (ZOCOR) 20 MG tablet Take 2 tablets (40 mg total) by mouth at bedtime.  180 tablet  3   No current facility-administered medications for this visit.    Physical Exam: Filed Vitals:   06/30/12 1204  BP: 163/76  Pulse: 60  Height: 6' (1.829 m)  Weight: 174 lb 3.2 oz (79.017 kg)    GEN- The patient is well appearing, alert and oriented x 3 today.   Head- normocephalic, atraumatic Eyes-  Sclera clear, conjunctiva pink Ears- hearing intact Oropharynx- clear Lungs- Clear to ausculation bilaterally, normal work of breathing Chest- pacemaker pocket is well  healed Heart- Regular rate and rhythm, no murmurs, rubs or gallops, PMI not laterally displaced GI- soft, NT, ND, + BS Extremities- no clubbing, cyanosis, or edema  Pacemaker interrogation- reviewed in detail today,  See PACEART report  Assessment and Plan:  1. Mobitz II AV block Normal pacemaker function See Pace Art report  2. CAD Stable No change required today  3. HTN Elevated, he will need to follow his BP closely at home No change required today  Return to the device clinic 3/15

## 2012-07-10 ENCOUNTER — Telehealth: Payer: Self-pay | Admitting: Internal Medicine

## 2012-07-10 ENCOUNTER — Ambulatory Visit (INDEPENDENT_AMBULATORY_CARE_PROVIDER_SITE_OTHER): Payer: Medicare Other

## 2012-07-10 ENCOUNTER — Encounter: Payer: Self-pay | Admitting: Internal Medicine

## 2012-07-10 DIAGNOSIS — E538 Deficiency of other specified B group vitamins: Secondary | ICD-10-CM

## 2012-07-10 MED ORDER — CYANOCOBALAMIN 1000 MCG/ML IJ SOLN
1000.0000 ug | Freq: Once | INTRAMUSCULAR | Status: AC
  Administered 2012-07-10: 1000 ug via INTRAMUSCULAR

## 2012-07-10 NOTE — Telephone Encounter (Signed)
[name] is planning to come for 6 mo labs next week.  He states he comes every 6 mo for fasting labs.  He wants the B-12 checked to see if he needs to stay on the B-12 injections.

## 2012-07-14 NOTE — Telephone Encounter (Signed)
LMOM to return call.

## 2012-07-14 NOTE — Telephone Encounter (Signed)
Pt is aware.  He also made a follow up appt on July 23.

## 2012-07-14 NOTE — Telephone Encounter (Signed)
Order for B12 entered. ABN printed - block was he had a test within the last year.

## 2012-07-30 ENCOUNTER — Other Ambulatory Visit (INDEPENDENT_AMBULATORY_CARE_PROVIDER_SITE_OTHER): Payer: Medicare Other

## 2012-07-30 ENCOUNTER — Ambulatory Visit (INDEPENDENT_AMBULATORY_CARE_PROVIDER_SITE_OTHER): Payer: Medicare Other | Admitting: Internal Medicine

## 2012-07-30 ENCOUNTER — Encounter: Payer: Self-pay | Admitting: Internal Medicine

## 2012-07-30 VITALS — BP 120/60 | HR 67 | Temp 97.9°F | Ht 72.0 in | Wt 178.8 lb

## 2012-07-30 DIAGNOSIS — I2510 Atherosclerotic heart disease of native coronary artery without angina pectoris: Secondary | ICD-10-CM

## 2012-07-30 DIAGNOSIS — E538 Deficiency of other specified B group vitamins: Secondary | ICD-10-CM

## 2012-07-30 DIAGNOSIS — E7800 Pure hypercholesterolemia, unspecified: Secondary | ICD-10-CM

## 2012-07-30 DIAGNOSIS — Z951 Presence of aortocoronary bypass graft: Secondary | ICD-10-CM

## 2012-07-30 LAB — VITAMIN B12: Vitamin B-12: 472 pg/mL (ref 211–911)

## 2012-07-30 NOTE — Assessment & Plan Note (Signed)
Discussed long term management. With his LDL being very well controlled and liver functions being normal recommend lipid panel annually.

## 2012-07-30 NOTE — Assessment & Plan Note (Signed)
Mr. [name] had been followed by Dr. Dannielle [name]. His disease progressed from  Minimal obstructive disease w/ patent stents  10/907 to severe 3 vessel disease 10/15/11  Followed by 3 vessel CABG. He has been doing well with a good recovery from surgery. Risk factor modification is good.  PLan Continue present regimen  Have him establish with Dr. Burt [name] since Dr. Arlina Robes departure from cardiology practice.

## 2012-07-30 NOTE — Progress Notes (Signed)
Subjective:    Patient ID: [name], male    DOB: [birth year], 79 y.o.   MRN: [id number]  HPI [name] presents for follow up. He does have billing questions: he got a financial surprise when his admission for device placement was obs rather than in-pt. Spent time trying to explain the system (good luck with that).  He has been feeling well: no cardiac symptoms, good energy, has continued to be active. He does need referral for a primary cardiologist since Dr. Arlina Robes departure.  He is concerned about his B12 level-last checked Jan '14  Level was > 1500 and he is concerned about lipid monitoring. He wanted to know how he progressed to severe three vessel disease if his LDL has been less than 80 most of the time since 2008.  Past Medical History  Diagnosis Date  . Hypertension     well-controlled.  [name] CAD (coronary artery disease)     a. S/P stenting to mid RCA, prox PDA 06/1999. b. NSTEMI/CABG x 3 in 10/2011 with LIMA to LAD, SVG to PDA, and SVG to OM1.   . CKD (chronic kidney disease), stage III     a. stable with a creatinine around 1.9-2.0 followed by nephrology.  . Dyslipidemia   . Neck injury     a. C3-C4 and C4-C5 foraminal narrowing, severe  . Renal artery stenosis     a. 50% by cath 2001  . Pulmonary nodule     a. felt to be noncancerous.  Status post followup CT scan 4 mm and stable.  . Chronic UTI     a. Followed by Dr. Risa Grill - colonized/asymptomatic - not on abx  . Atrial fibrillation     a. Post-op from CABG, on amiodarone temporarily, d/c'd 12/2011  . Acute superficial venous thrombosis of lower extremity     a. RLE after CABG, neg dopp for DVT.  [name] Prostate cancer     a. 2001 s/p TURP.  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 S/p ptca    . Cardia catherization  07-07-99  . Peripheral vascular catherization  11-24-03  . Renal circulation  10-01-03  .  Stress cardiolite  05-04-05    spring 09-negative except for apical thinning, EF 68%  . Edg  07-17-1994  . Flexible sigmoidoscopy  11-03-1997  . Prostatectomy    . Lumbar spinal disk and neck fusion surgery    . Stents      X 2  . Coronary artery bypass graft  10/19/2011    Procedure: CORONARY ARTERY BYPASS GRAFTING (CABG);  Surgeon: Gaye Pollack, MD;  Location: [facility];  Service: Open Heart Surgery;  Laterality: N/A;  times three using Left Internal Mammary Artery and Right Greater Saphenouse Vein Graft harvested Endoscopically  . Cardiac surgery  10/18/12    open heart surgery  . Colonoscopy  04/12/07  . Pacemaker insertion  03/28/12    PPM implanted for mobitz II AV block   Family History  Problem Relation Age of Onset  . Coronary artery disease Father     died @ 27  . Cancer Brother     Bladder  . Diabetes Neg Hx   . Prostate cancer Brother   . Nephritis Brother     died @ age 40.  . Other Brother     cerebral hemorrhage - died @ 67  . Other Mother  cerebral hemorrhage - died @ 67  . Heart disease Brother   . Arthritis Mother   . Breast cancer Other     niece x 2   History   Social History  . Marital Status: Married    Spouse Name: N/A    Number of Children: 2  . Years of Education: N/A   Occupational History  . 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     Social History Main Topics  . Smoking status: Never Smoker   . Smokeless tobacco: Never Used  . Alcohol Use: Yes     Comment: Rarely  . Drug Use: No  . Sexually Active: Not on file   Other Topics Concern  . Not on file   Social History Narrative   HSG, 1 year college.  married '52 - 3 years, divorced; married '56 - 3 years divorced; married '77-12 yrs - divorced; married '75 -. 1 son '57; 1 daughter - '53; 1 grandchild.  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  Very happily  married.  End of life care: yes CPR, no long term mechanical ventilation, no heroic measures.     Current Outpatient Prescriptions on File Prior to Visit  Medication Sig Dispense Refill  . aspirin 325 MG buffered tablet Take 325 mg by mouth daily.        . CYANOCOBALAMIN IJ Inject 1 mL as directed every 30 (thirty) days. Usually given between the 1st and 5th of each month      . ergocalciferol (VITAMIN D2) 50000 UNITS capsule Take 50,000 Units by mouth every 30 (thirty) days. 15 th of each month      . fluticasone (FLONASE) 50 MCG/ACT nasal spray Place 1 spray into the nose 2 (two) times daily as needed. For cold symptoms      . folic acid (FOLVITE) 1 MG tablet Take 1 tablet (1 mg total) by mouth daily.  90 tablet  3  . metoprolol succinate (TOPROL-XL) 25 MG 24 hr tablet Take 2 times daily by mouth.  180 tablet  3  . nitroGLYCERIN (NITROSTAT) 0.4 MG SL tablet Place 1 tablet (0.4 mg total) under the tongue every 5 (five) minutes as needed for chest pain.  15 tablet  3  . potassium chloride (K-DUR) 10 MEQ tablet Take 1 tablet (10 mEq total) by mouth daily.  90 tablet  3  . simvastatin (ZOCOR) 20 MG tablet Take 2 tablets (40 mg total) by mouth at bedtime.  180 tablet  3   No current facility-administered medications on file prior to visit.      Review of Systems Constitutional:  Negative for fever, chills, activity change and unexpected weight change.  HEENT:  Negative for hearing loss, ear pain, congestion, neck stiffness and postnasal drip. Negative for sore throat or swallowing problems. Negative for dental complaints.   Eyes: Negative for vision loss or change in visual acuity.  Respiratory: Negative for chest tightness and wheezing. Negative for DOE.   Cardiovascular: Negative for chest pain or palpitations. No decreased exercise tolerance Gastrointestinal: No change in bowel habit. No bloating or gas. No reflux or indigestion Psychiatric/Behavioral: Negative for behavioral problems and  dysphoric mood.       Objective:   Physical Exam Filed Vitals:   07/30/12 1355  BP: 120/60  Pulse: 67  Temp: 97.9  F (36.6 C)   Wt Readings from Last 3 Encounters:  07/30/12 178 lb 12.8 oz (81.103 kg)  06/30/12 174 lb 3.2 oz (79.017 kg)  03/29/12 174 lb 11.2 oz (79.243 kg)   BP Readings from Last 3 Encounters:  07/30/12 120/60  06/30/12 163/76  03/29/12 133/66   Gen'l - a young looking 79 y/o in no distress HEENT - C&S clear Cor - RRR Pulm - normal respirations Neuro - a&O x 3, normal get up and go, normal gait.       Assessment & Plan:

## 2012-07-30 NOTE — Assessment & Plan Note (Signed)
B12 has drifted down over the past 7 months.  Plan Replacement to resume with either monthly B12 injections or daily B12 1,000 mc daily  F/u lab in 3-6 months depending on method of replacement

## 2012-07-30 NOTE — Patient Instructions (Addendum)
Good to see you.  Will get B12 today.  Will try to set you up with Dr. Burt Knack as your primary cardiologist.  LDL has been great since 2008 on medication. Next lipid panel in Jan '15

## 2012-07-31 ENCOUNTER — Encounter: Payer: Self-pay | Admitting: Internal Medicine

## 2012-08-05 ENCOUNTER — Encounter: Payer: Self-pay | Admitting: Internal Medicine

## 2012-08-12 ENCOUNTER — Ambulatory Visit: Payer: Medicare Other

## 2012-08-26 ENCOUNTER — Telehealth: Payer: Self-pay | Admitting: Internal Medicine

## 2012-08-26 NOTE — Telephone Encounter (Signed)
New problem  Pt went to the dentist... They used an ultra sounding instrument during the cleaning and he wants to know if that would have done some harm to the pacemaker.

## 2012-08-26 NOTE — Telephone Encounter (Signed)
Spoke w/pt and informed should be fine with instrument that was used.

## 2012-08-29 ENCOUNTER — Encounter: Payer: Self-pay | Admitting: Cardiovascular Disease

## 2012-08-29 ENCOUNTER — Ambulatory Visit (INDEPENDENT_AMBULATORY_CARE_PROVIDER_SITE_OTHER): Payer: Medicare Other | Admitting: Cardiovascular Disease

## 2012-08-29 VITALS — BP 122/64 | HR 60 | Ht 72.0 in | Wt 176.0 lb

## 2012-08-29 DIAGNOSIS — I2510 Atherosclerotic heart disease of native coronary artery without angina pectoris: Secondary | ICD-10-CM

## 2012-08-29 DIAGNOSIS — E7800 Pure hypercholesterolemia, unspecified: Secondary | ICD-10-CM

## 2012-08-29 NOTE — Patient Instructions (Addendum)
Your physician has recommended you make the following change in your medication: DECREASE Simvastatin to 20mg  take one by mouth daily  Your physician recommends that you return for a FASTING LIPID and LIVER Profile in 3 MONTHS--nothing to eat or drink after midnight, lab opens at 7:30  Your physician wants you to follow-up in: 6 MONTHS with Dr Burt Knack.  You will receive a reminder letter in the mail two months in advance. If you don't receive a letter, please call our office to schedule the follow-up appointment.

## 2012-08-29 NOTE — Progress Notes (Signed)
HPI:  79 year old gentleman presenting for followup evaluation. The patient has coronary artery disease and underwent multivessel CABG in 2013 after presenting with a non-ST elevation infarction. He's been followed by Dr. Dannielle Burn for many years.  He feels great. He exercises regularly without exertional symptoms. He denies chest pain, chest pressure, dyspnea, orthopnea, PND, or leg swelling. He is very compliant with his medications. He follows a prudent diet.  Outpatient Encounter Prescriptions as of 08/29/2012  Medication Sig Dispense Refill  . aspirin 325 MG buffered tablet Take 325 mg by mouth daily.        . cyanocobalamin 1000 MCG tablet Take 100 mcg by mouth daily.      . ergocalciferol (VITAMIN D2) 50000 UNITS capsule Take 50,000 Units by mouth every 30 (thirty) days. 15 th of each month      . fluticasone (FLONASE) 50 MCG/ACT nasal spray Place 1 spray into the nose 2 (two) times daily as needed. For cold symptoms      . folic acid (FOLVITE) 1 MG tablet Take 1 tablet (1 mg total) by mouth daily.  90 tablet  3  . metoprolol succinate (TOPROL-XL) 25 MG 24 hr tablet Take 2 times daily by mouth.  180 tablet  3  . nitroGLYCERIN (NITROSTAT) 0.4 MG SL tablet Place 1 tablet (0.4 mg total) under the tongue every 5 (five) minutes as needed for chest pain.  15 tablet  3  . potassium chloride (K-DUR) 10 MEQ tablet Take 1 tablet (10 mEq total) by mouth daily.  90 tablet  3  . simvastatin (ZOCOR) 20 MG tablet Take 1 tablet (20 mg total) by mouth at bedtime.  1 tablet  0  . [DISCONTINUED] simvastatin (ZOCOR) 20 MG tablet Take 2 tablets (40 mg total) by mouth at bedtime.  180 tablet  3  . [DISCONTINUED] CYANOCOBALAMIN IJ Inject 1 mL as directed every 30 (thirty) days. Usually given between the 1st and 5th of each month       No facility-administered encounter medications on file as of 08/29/2012.    Allergies  Allergen Reactions  . Amoxicillin Nausea Only  . Aspirin     REACTION: upset stomach;  tolerates coated aspirin  . Atarax [Hydroxyzine Hcl]   . Ciprofloxacin Nausea Only  . Codeine     REACTION: Stomach upset  . Hydrocodone Nausea Only  . Macrobid [Nitrofurantoin Macrocrystal] Nausea Only  . Niacin   . Niacin-Lovastatin Er     Unsure of reaction. Taking simvastatin at home without problems  . Nitrofurantoin   . Trimox [Amoxicillin Trihydrate]     Past Medical History  Diagnosis Date  . Hypertension     well-controlled.  [name] CAD (coronary artery disease)     a. S/P stenting to mid RCA, prox PDA 06/1999. b. NSTEMI/CABG x 3 in 10/2011 with LIMA to LAD, SVG to PDA, and SVG to OM1.   . CKD (chronic kidney disease), stage III     a. stable with a creatinine around 1.9-2.0 followed by nephrology.  . Dyslipidemia   . Neck injury     a. C3-C4 and C4-C5 foraminal narrowing, severe  . Renal artery stenosis     a. 50% by cath 2001  . Pulmonary nodule     a. felt to be noncancerous.  Status post followup CT scan 4 mm and stable.  . Chronic UTI     a. Followed by Dr. Risa Grill - colonized/asymptomatic - not on abx  . Atrial fibrillation  a. Post-op from CABG, on amiodarone temporarily, d/c'd 12/2011  . Acute superficial venous thrombosis of lower extremity     a. RLE after CABG, neg dopp for DVT.  [name] Prostate cancer     a. 2001 s/p TURP.  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 Coronary artery disease, native vessel. The patient is stable without anginal symptoms.  His medical program is appropriate with aspirin, a beta blocker, and a statin drug.  2. Hyperlipidemia. The patient's lipids were reviewed and his cholesterol is only 97. I think we should reduce his simvastatin dose to 20 mg daily. Will repeat lipids and LFTs in 3 months. I'll see him back in 6 months.  3. Hypertension. Blood pressure is in an ideal range.  4.Mobitz 2 AV block. He is status post permanent pacemaker. Followed by Dr. Rayann Heman.  For followup I will see him back in 6 months.  [name] 08/29/2012 3:28 PM

## 2012-09-01 ENCOUNTER — Encounter: Payer: Self-pay | Admitting: Internal Medicine

## 2012-09-16 ENCOUNTER — Ambulatory Visit (INDEPENDENT_AMBULATORY_CARE_PROVIDER_SITE_OTHER): Payer: Medicare Other

## 2012-09-16 DIAGNOSIS — Z23 Encounter for immunization: Secondary | ICD-10-CM

## 2012-10-18 HISTORY — PX: CARDIAC SURGERY: SHX584

## 2012-11-27 ENCOUNTER — Other Ambulatory Visit (INDEPENDENT_AMBULATORY_CARE_PROVIDER_SITE_OTHER): Payer: Medicare Other

## 2012-11-27 DIAGNOSIS — I2510 Atherosclerotic heart disease of native coronary artery without angina pectoris: Secondary | ICD-10-CM

## 2012-11-27 DIAGNOSIS — E7800 Pure hypercholesterolemia, unspecified: Secondary | ICD-10-CM

## 2012-11-27 LAB — LIPID PANEL
LDL Cholesterol: 66 mg/dL (ref 0–99)
Total CHOL/HDL Ratio: 3
Triglycerides: 71 mg/dL (ref 0.0–149.0)

## 2012-11-27 LAB — HEPATIC FUNCTION PANEL
AST: 22 U/L (ref 0–37)
Alkaline Phosphatase: 64 U/L (ref 39–117)
Bilirubin, Direct: 0.2 mg/dL (ref 0.0–0.3)
Total Bilirubin: 1.1 mg/dL (ref 0.3–1.2)

## 2012-12-10 ENCOUNTER — Encounter: Payer: Self-pay | Admitting: Internal Medicine

## 2012-12-10 DIAGNOSIS — I2510 Atherosclerotic heart disease of native coronary artery without angina pectoris: Secondary | ICD-10-CM

## 2012-12-10 DIAGNOSIS — E7800 Pure hypercholesterolemia, unspecified: Secondary | ICD-10-CM

## 2012-12-11 MED ORDER — NITROGLYCERIN 0.4 MG SL SUBL: 0.4000 mg | SUBLINGUAL_TABLET | SUBLINGUAL | Status: DC | PRN

## 2012-12-11 MED ORDER — METOPROLOL SUCCINATE ER 25 MG PO TB24: ORAL_TABLET | ORAL | Status: DC

## 2012-12-11 MED ORDER — FOLIC ACID 1 MG PO TABS: 1.0000 mg | ORAL_TABLET | Freq: Every day | ORAL | Status: DC

## 2012-12-11 MED ORDER — POTASSIUM CHLORIDE ER 10 MEQ PO TBCR: 10.0000 meq | EXTENDED_RELEASE_TABLET | Freq: Every day | ORAL | Status: DC

## 2012-12-11 MED ORDER — SIMVASTATIN 20 MG PO TABS: 20.0000 mg | ORAL_TABLET | Freq: Every day | ORAL | Status: DC

## 2012-12-11 MED ORDER — CLOTRIMAZOLE-BETAMETHASONE 1-0.05 % EX CREA: 1.0000 "application " | TOPICAL_CREAM | CUTANEOUS | Status: DC | PRN

## 2012-12-11 NOTE — Telephone Encounter (Signed)
Prescription has been sent.

## 2012-12-11 NOTE — Telephone Encounter (Signed)
I do not see this cream on his med list that he is requesting.

## 2013-01-15 ENCOUNTER — Other Ambulatory Visit (INDEPENDENT_AMBULATORY_CARE_PROVIDER_SITE_OTHER): Payer: Medicare Other

## 2013-01-15 DIAGNOSIS — E538 Deficiency of other specified B group vitamins: Secondary | ICD-10-CM

## 2013-01-15 LAB — VITAMIN B12: VITAMIN B 12: 484 pg/mL (ref 211–911)

## 2013-01-16 ENCOUNTER — Other Ambulatory Visit: Payer: Self-pay | Admitting: Internal Medicine

## 2013-01-16 DIAGNOSIS — E538 Deficiency of other specified B group vitamins: Secondary | ICD-10-CM

## 2013-01-27 ENCOUNTER — Telehealth: Payer: Self-pay | Admitting: Cardiovascular Disease

## 2013-01-27 NOTE — Telephone Encounter (Signed)
Pt reports a tight, painful sensation at left chest, outer aspect, below pacemaker insertion site.  Feels it when he yawns or coughs or takes deep breath. He is concerned that his pacer leads may have become dislodged.  Pt had pacer placed in March, 2014.  Discussed with EP nurse.  Instructed patient that leads should be stable by now and not dislodged from doing heimlich maneuver.  It is likely that what he is feeling is muscular.  Instructed him to take tylenol 650mg  every 4-6 hours for couple doses.  If no relief, or if other symptoms arise we could check the device in pacer clinic. Pt verbalizes understanding and is agreeable with this.

## 2013-01-27 NOTE — Telephone Encounter (Signed)
New message          Pt helped someone that was chocking yesterday now he has tightness in the chest. Wants to know if his pacemaker was effected in any way.

## 2013-01-30 ENCOUNTER — Encounter (HOSPITAL_COMMUNITY): Payer: Self-pay | Admitting: Emergency Medicine

## 2013-01-30 ENCOUNTER — Emergency Department (HOSPITAL_COMMUNITY)
Admission: EM | Admit: 2013-01-30 | Discharge: 2013-01-30 | Disposition: A | Discharge status: Home / Self Care | Payer: Medicare Other | Attending: Emergency Medicine | Admitting: Emergency Medicine

## 2013-01-30 ENCOUNTER — Encounter: Payer: Self-pay | Admitting: Internal Medicine

## 2013-01-30 ENCOUNTER — Other Ambulatory Visit: Payer: Self-pay | Admitting: Physician Assistant

## 2013-01-30 ENCOUNTER — Emergency Department (HOSPITAL_COMMUNITY): Payer: Medicare Other

## 2013-01-30 DIAGNOSIS — R079 Chest pain, unspecified: Secondary | ICD-10-CM | POA: Diagnosis present

## 2013-01-30 DIAGNOSIS — N184 Chronic kidney disease, stage 4 (severe): Secondary | ICD-10-CM | POA: Diagnosis present

## 2013-01-30 DIAGNOSIS — I441 Atrioventricular block, second degree: Secondary | ICD-10-CM | POA: Diagnosis present

## 2013-01-30 DIAGNOSIS — N183 Chronic kidney disease, stage 3 unspecified: Secondary | ICD-10-CM | POA: Insufficient documentation

## 2013-01-30 DIAGNOSIS — I2510 Atherosclerotic heart disease of native coronary artery without angina pectoris: Secondary | ICD-10-CM | POA: Insufficient documentation

## 2013-01-30 DIAGNOSIS — Z950 Presence of cardiac pacemaker: Secondary | ICD-10-CM | POA: Insufficient documentation

## 2013-01-30 DIAGNOSIS — Z951 Presence of aortocoronary bypass graft: Secondary | ICD-10-CM | POA: Insufficient documentation

## 2013-01-30 DIAGNOSIS — C61 Malignant neoplasm of prostate: Secondary | ICD-10-CM

## 2013-01-30 DIAGNOSIS — I4891 Unspecified atrial fibrillation: Secondary | ICD-10-CM | POA: Insufficient documentation

## 2013-01-30 DIAGNOSIS — I10 Essential (primary) hypertension: Secondary | ICD-10-CM | POA: Diagnosis present

## 2013-01-30 DIAGNOSIS — Z87828 Personal history of other (healed) physical injury and trauma: Secondary | ICD-10-CM | POA: Insufficient documentation

## 2013-01-30 DIAGNOSIS — I129 Hypertensive chronic kidney disease with stage 1 through stage 4 chronic kidney disease, or unspecified chronic kidney disease: Secondary | ICD-10-CM | POA: Insufficient documentation

## 2013-01-30 DIAGNOSIS — R0789 Other chest pain: Secondary | ICD-10-CM | POA: Insufficient documentation

## 2013-01-30 DIAGNOSIS — IMO0002 Reserved for concepts with insufficient information to code with codable children: Secondary | ICD-10-CM | POA: Insufficient documentation

## 2013-01-30 DIAGNOSIS — Z9861 Coronary angioplasty status: Secondary | ICD-10-CM | POA: Insufficient documentation

## 2013-01-30 DIAGNOSIS — Z7982 Long term (current) use of aspirin: Secondary | ICD-10-CM | POA: Insufficient documentation

## 2013-01-30 DIAGNOSIS — Z8546 Personal history of malignant neoplasm of prostate: Secondary | ICD-10-CM | POA: Insufficient documentation

## 2013-01-30 DIAGNOSIS — Z79899 Other long term (current) drug therapy: Secondary | ICD-10-CM | POA: Insufficient documentation

## 2013-01-30 DIAGNOSIS — E785 Hyperlipidemia, unspecified: Secondary | ICD-10-CM | POA: Insufficient documentation

## 2013-01-30 LAB — POCT I-STAT, CHEM 8
BUN: 22 mg/dL (ref 6–23)
Calcium, Ion: 1.25 mmol/L (ref 1.13–1.30)
Chloride: 105 mEq/L (ref 96–112)
Creatinine, Ser: 2 mg/dL — ABNORMAL HIGH (ref 0.50–1.35)
GLUCOSE: 75 mg/dL (ref 70–99)
HEMATOCRIT: 44 % (ref 39.0–52.0)
Hemoglobin: 15 g/dL (ref 13.0–17.0)
POTASSIUM: 4.6 meq/L (ref 3.7–5.3)
Sodium: 140 mEq/L (ref 137–147)
TCO2: 24 mmol/L (ref 0–100)

## 2013-01-30 LAB — POCT I-STAT TROPONIN I
TROPONIN I, POC: 0.01 ng/mL (ref 0.00–0.08)
Troponin i, poc: 0.01 ng/mL (ref 0.00–0.08)

## 2013-01-30 LAB — CBC WITH DIFFERENTIAL/PLATELET
Basophils Absolute: 0 10*3/uL (ref 0.0–0.1)
Basophils Relative: 1 % (ref 0–1)
EOS ABS: 0.1 10*3/uL (ref 0.0–0.7)
Eosinophils Relative: 2 % (ref 0–5)
HCT: 41 % (ref 39.0–52.0)
Hemoglobin: 13.3 g/dL (ref 13.0–17.0)
Lymphocytes Relative: 21 % (ref 12–46)
Lymphs Abs: 1 10*3/uL (ref 0.7–4.0)
MCH: 25.2 pg — AB (ref 26.0–34.0)
MCHC: 32.4 g/dL (ref 30.0–36.0)
MCV: 77.7 fL — ABNORMAL LOW (ref 78.0–100.0)
Monocytes Absolute: 0.5 10*3/uL (ref 0.1–1.0)
Monocytes Relative: 10 % (ref 3–12)
NEUTROS PCT: 66 % (ref 43–77)
Neutro Abs: 3.1 10*3/uL (ref 1.7–7.7)
PLATELETS: 177 10*3/uL (ref 150–400)
RBC: 5.28 MIL/uL (ref 4.22–5.81)
RDW: 15.1 % (ref 11.5–15.5)
WBC: 4.6 10*3/uL (ref 4.0–10.5)

## 2013-01-30 MED ORDER — ACETAMINOPHEN 325 MG PO TABS
650.0000 mg | ORAL_TABLET | Freq: Once | ORAL | Status: AC
  Administered 2013-01-30: 650 mg via ORAL
  Filled 2013-01-30: qty 2

## 2013-01-30 NOTE — Telephone Encounter (Signed)
Received call directly from operator; patient calls with c/o chest pain to left chest onset a few days ago, unresolved with Tylenol.  Patient states the pain is located near the left nipple and feels like a pencil jabbing into him.  Patient states he does not have pain at the site of the nipple.  Patient rates pain 5-6 on 0-10 scale and states the "jab" linger 10-30 seconds.  Patient c/o occasional SOB and notes this a "2" on 0-10 scale.  Patient denies n/v; reports that pain is not reproducable.  Patient states the pain feels like he felt prior to his heart attack in 2013.  I advised patient to call 911 and proceed to the ER; patient states that his wife is at home with him at this time.  I advised him to take a NTG at this time and call 911 and verified that patient took ASA 325 mg this morning.  Patient verbalized agreement and understanding.  Trish, hospital liason notified.

## 2013-01-30 NOTE — ED Notes (Signed)
Pt complains of CP 4/10 x2 days nonradiating - worse with inspiration and palpation. Pt gave himself 325 ASA and 1 Nitro around 0900 this morning. No difference in pain after taking nitro. Pt has hx of CABGx3, PPM, 3rd degree HB. Pt states he has had episdoes similar to this in the past and troponins were positive even with EKG being normal. 144/72, 60HR, 97% RA.

## 2013-01-30 NOTE — Consult Note (Signed)
Patient ID: [name] MRN: [id number], DOB/AGE: 06-06-1926   Admit date: 01/30/2013   Primary Physician: Adella Hare, MD Primary Cardiologist: Dr Ned Grace MD: Dr Crissie Sickles  HPI: 80 y/o with a history of CAD. He had an RCA PCI in 2001. He ultimately had a CABG x 3 10/18/13 with an LIMA-LAD, SVG-OM, SVG-PDA. His EF was 50%. He presented in March 2014 with type 2 AVB and received a MDT pacemaker. He has done well since. He goes to cardiac rehab 3 x week and does cardio for 30 minutes. Earlier this week he noted sharp, localized, brief chest pain and called the office. And was told to try Tylenol. Today he had another episode and called the office and was told to come to the hospital. He feels he had similar symptoms in 2013 before his CABG but on further questioning he admits he also had dyspnea on exertion which he does not have now.    Problem List: Past Medical History  Diagnosis Date  . Hypertension     well-controlled.  [name] CAD (coronary artery disease)     a. S/P stenting to mid RCA, prox PDA 06/1999. b. NSTEMI/CABG x 3 in 10/2011 with LIMA to LAD, SVG to PDA, and SVG to OM1.   . CKD (chronic kidney disease), stage III     a. stable with a creatinine around 1.9-2.0 followed by nephrology.  . Dyslipidemia   . Neck injury     a. C3-C4 and C4-C5 foraminal narrowing, severe  . Renal artery stenosis     a. 50% by cath 2001  . Pulmonary nodule     a. felt to be noncancerous.  Status post followup CT scan 4 mm and stable.  . Chronic UTI     a. Followed by Dr. Risa Grill - colonized/asymptomatic - not on abx  . Atrial fibrillation     a. Post-op from CABG, on amiodarone temporarily, d/c'd 12/2011  . Acute superficial venous thrombosis of lower extremity     a. RLE after CABG, neg dopp for DVT.  [name] Prostate cancer     a. 2001 s/p TURP.  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 S/p  ptca    . Cardia catherization  07-07-99  . Peripheral vascular catherization  11-24-03  . Renal circulation  10-01-03  . Stress cardiolite  05-04-05    spring 09-negative except for apical thinning, EF 68%  . Edg  07-17-1994  . Flexible sigmoidoscopy  11-03-1997  . Prostatectomy    . Lumbar spinal disk and neck fusion surgery    . Stents      X 2  . Coronary artery bypass graft  10/19/2011    Procedure: CORONARY ARTERY BYPASS GRAFTING (CABG);  Surgeon: Gaye Pollack, MD;  Location: [facility];  Service: Open Heart Surgery;  Laterality: N/A;  times three using Left Internal Mammary Artery and Right Greater Saphenouse Vein Graft harvested Endoscopically  . Cardiac surgery  10/18/12    open heart surgery  . Colonoscopy  04/12/07  . Pacemaker insertion  03/28/12    PPM implanted for mobitz II AV block     Allergies:  Allergies  Allergen Reactions  . Amoxicillin Nausea Only  . Aspirin     REACTION: upset stomach; tolerates coated aspirin  . Atarax [Hydroxyzine Hcl] Other (See Comments)    REACTION: unknown  . Ciprofloxacin Nausea Only  .  Codeine     REACTION: Stomach upset  . Hydrocodone Nausea Only  . Macrobid [Nitrofurantoin Macrocrystal] Nausea Only  . Niacin Other (See Comments)    REACTION: unknown  . Niacin-Lovastatin Er     Unsure of reaction. Taking simvastatin at home without problems  . Nitrofurantoin Other (See Comments)    REACTION: upset stomach  . Trimox [Amoxicillin Trihydrate] Other (See Comments)    REACTION: unknown     Home Medications No current facility-administered medications for this encounter.   Current Outpatient Prescriptions  Medication Sig Dispense Refill  . aspirin 325 MG buffered tablet Take 325 mg by mouth daily.        . cyanocobalamin 1000 MCG tablet Take 1,000 mcg by mouth daily.       . ergocalciferol (VITAMIN D2) 50000 UNITS capsule Take 50,000 Units by mouth every 30 (thirty) days. 15 th of each month      . folic acid (FOLVITE) 1 MG tablet  Take 1 tablet (1 mg total) by mouth daily.  90 tablet  3  . metoprolol succinate (TOPROL-XL) 25 MG 24 hr tablet Take 2 times daily by mouth.  180 tablet  3  . nitroGLYCERIN (NITROSTAT) 0.4 MG SL tablet Place 1 tablet (0.4 mg total) under the tongue every 5 (five) minutes as needed for chest pain.  90 tablet  3  . potassium chloride (K-DUR) 10 MEQ tablet Take 1 tablet (10 mEq total) by mouth daily.  90 tablet  3  . simvastatin (ZOCOR) 20 MG tablet Take 1 tablet (20 mg total) by mouth at bedtime.  90 tablet  3     Family History  Problem Relation Age of Onset  . Coronary artery disease Father     died @ 40  . Cancer Brother     Bladder  . Diabetes Neg Hx   . Prostate cancer Brother   . Nephritis Brother     died @ age 28.  . Other Brother     cerebral hemorrhage - died @ 38  . Other Mother     cerebral hemorrhage - died @ 81  . Heart disease Brother   . Arthritis Mother   . Breast cancer Other     niece x 2     History   Social History  . Marital Status: Married    Spouse Name: N/A    Number of Children: 2  . Years of Education: N/A   Occupational History  . 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     Social History Main Topics  . Smoking status: Never Smoker   . Smokeless tobacco: Never Used  . Alcohol Use: Yes     Comment: Rarely  . Drug Use: No  . Sexual Activity: Not on file   Other Topics Concern  . Not on file   Social History Narrative   HSG, 1 year college.  married '52 - 3 years, divorced; married '56 - 3 years divorced; married '37-12 yrs - divorced; married '75 -. 1 son '57; 1 daughter - '53; 1 grandchild.  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  Very happily married.  End of life care: yes CPR, no long term mechanical ventilation, no heroic measures.     Review of Systems: General: negative for chills, fever, night sweats  or weight changes.  Cardiovascular: negative for chest pain, dyspnea on exertion, edema, orthopnea, palpitations, paroxysmal nocturnal dyspnea or shortness of breath Dermatological: negative for rash Respiratory: negative for cough or wheezing Urologic: negative for hematuria Abdominal: negative for nausea, vomiting, diarrhea, bright red blood per rectum, melena, or hematemesis Neurologic: negative for visual changes, syncope, or dizziness All other systems reviewed and are otherwise negative except as noted above.  Physical Exam: Blood pressure 172/77, pulse 61, temperature 98.3 F (36.8 C), temperature source Oral, resp. rate 16, SpO2 99.00%.  General appearance: alert, cooperative and no distress Neck: no carotid bruit and no JVD Lungs: clear to auscultation bilaterally Heart: regular rate and rhythm, S1, S2 normal, no murmur, click, rub or gallop Abdomen: soft, non-tender; bowel sounds normal; no masses,  no organomegaly Extremities: extremities normal, atraumatic, no cyanosis or edema Pulses: 2+ and symmetric Skin: Skin color, texture, turgor normal. No rashes or lesions Neurologic: Grossly normal    Labs:   Results for orders placed during the hospital encounter of 01/30/13 (from the past 24 hour(s))  CBC WITH DIFFERENTIAL     Status: Abnormal   Collection Time    01/30/13 10:26 AM      Result Value Range   WBC 4.6  4.0 - 10.5 K/uL   RBC 5.28  4.22 - 5.81 MIL/uL   Hemoglobin 13.3  13.0 - 17.0 g/dL   HCT 41.0  39.0 - 52.0 %   MCV 77.7 (*) 78.0 - 100.0 fL   MCH 25.2 (*) 26.0 - 34.0 pg   MCHC 32.4  30.0 - 36.0 g/dL   RDW 15.1  11.5 - 15.5 %   Platelets 177  150 - 400 K/uL   Neutrophils Relative % 66  43 - 77 %   Neutro Abs 3.1  1.7 - 7.7 K/uL   Lymphocytes Relative 21  12 - 46 %   Lymphs Abs 1.0  0.7 - 4.0 K/uL   Monocytes Relative 10  3 - 12 %   Monocytes Absolute 0.5  0.1 - 1.0 K/uL   Eosinophils Relative 2  0 - 5 %   Eosinophils Absolute 0.1  0.0 - 0.7 K/uL    Basophils Relative 1  0 - 1 %   Basophils Absolute 0.0  0.0 - 0.1 K/uL  POCT I-STAT TROPONIN I     Status: None   Collection Time    01/30/13 10:31 AM      Result Value Range   Troponin i, poc 0.01  0.00 - 0.08 ng/mL   Comment 3           POCT I-STAT, CHEM 8     Status: Abnormal   Collection Time    01/30/13 10:33 AM      Result Value Range   Sodium 140  137 - 147 mEq/L   Potassium 4.6  3.7 - 5.3 mEq/L   Chloride 105  96 - 112 mEq/L   BUN 22  6 - 23 mg/dL   Creatinine, Ser 2.00 (*) 0.50 - 1.35 mg/dL   Glucose, Bld 75  70 - 99 mg/dL   Calcium, Ion 1.25  1.13 - 1.30 mmol/L   TCO2 24  0 - 100 mmol/L   Hemoglobin 15.0  13.0 - 17.0 g/dL   HCT 44.0  39.0 - 52.0 %     Radiology/Studies: Dg Chest 2 View  01/30/2013   CLINICAL DATA:  left chest pain  EXAM: CHEST  2 VIEW  COMPARISON:  03/29/2012  FINDINGS: Cardiomediastinal silhouette is stable. Status  post CABG. Dual lead cardiac pacemaker is unchanged in position. No acute infiltrate or pleural effusion. Stable mild degenerative changes thoracic spine.  IMPRESSION: No active cardiopulmonary disease.   Electronically Signed   By: Lahoma Crocker M.D.   On: 01/30/2013 11:12    EKG:NSR without acute changes  ASSESSMENT AND PLAN:  Principal Problem:   Chest pain with moderate risk for cardiac etiology Active Problems:   CAD - CABG Oct 2013   Chronic renal impairment, stage 3 (moderate)   Dyslipidemia   HYPERTENSION   PROSTATE CANCER, HX OF- surgery 2001   AV block, 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   PLAN: Will review with MD, ? Admit for OBS vs prn NSAID and OP Myoview.    Henri Medal, PA-C 01/30/2013, 3:34 PM  Patient seen and examined and history reviewed. Agree with above findings and plan. Very pleasant 80 yo WM with above history presents to ED for evaluation of chest pain. Pain started Monday after Rehab. He thought he may have strained something lifting weight. Afterwards he went to eat at a restaurant and had to  perform a Heimlich maneuver on a rather large person at the restaurant. Since then pain has persisted. He states it feels like a pencil pushing in on the left pectoral region. Localized without radiation. No dyspnea. Exam is benign. Ecg is without acute change. Initial troponin is negative. Second troponin just drawn. I think his symptoms are atypical for cardiac pain. If second troponin is negative I would recommend DC from the ED and we will schedule an outpatient Myoview study.  Collier Salina [facility] 01/30/2013 4:08 PM

## 2013-01-30 NOTE — ED Provider Notes (Signed)
Medical screening examination/treatment/procedure(s) were conducted as a shared visit with non-physician practitioner(s) and myself.  I personally evaluated the patient during the encounter.  Intermittent sharp pain in L chest, lasting a few seconds at a time "feels like a pencil."  No SOB, vomiting, syncope.  Felt he may have pulled something at cardiac rehab and also performed heimlich maneuveur on someone.  EKG unchanged, troponin negative. Seems atypical for ACS but patient states had same pain prior to CABG in 2013.  EKG Interpretation    Date/Time:  Friday January 30 2013 10:12:52 EST Ventricular Rate:  62 PR Interval:  225 QRS Duration: 97 QT Interval:  432 QTC Calculation: 439 R Axis:   -38 Text Interpretation:  Sinus rhythm Atrial premature complex Prolonged PR interval Left axis deviation No significant change was found Confirmed by Wyvonnia Dusky  MD, Mayme Profeta ([id number]) on 01/30/2013 10:19:10 AM             Ezequiel Essex, MD 01/30/13 [phone]

## 2013-01-30 NOTE — ED Provider Notes (Signed)
Patient received from Domenic Moras PA-C. Patient 80 yo male with 2 wks of intermittent sharp chest pain that has no apparent pattern. Patient pain is currently resolved in ED. Lungs CTA bilaterally. Heart sounds normal with normal rhythm/rate. Serial troponins negative. Plan to have patient follow up with Cardiology on Monday for stress test. Advised patient to return to ED should his symptoms worsen prior to Cardiology follow up. Recommend patient avoid strenuous activity until can be seen at follow up. Patient agrees with plan. Discharged in good condition.        Sherrie George, PA-C 01/31/13 1355

## 2013-01-30 NOTE — Telephone Encounter (Signed)
Follow up:  Pt states he still is having pain in his chest. Pt states it feels like a pencil is jabbing into the left side of his chest. Pt states it feels like it did before he had his last heart attack. Pt would like to speak with the nurse.

## 2013-01-30 NOTE — ED Notes (Signed)
Pt states he now has no pain

## 2013-01-30 NOTE — Progress Notes (Signed)
2nd troponin negative. Per Dr. Doug Sou recommendation, will schedule outpatient Myoview and f/u. LM on our scheduling voicemail to call patient with these appts since he is being discharged from ER. ER nurse instructed to tell patient he will receive call from [place]. Brieanne Mignone PA-C

## 2013-01-30 NOTE — ED Provider Notes (Signed)
CSN: [id number]     Arrival date & time 01/30/13  1006 History   First MD Initiated Contact with Patient 01/30/13 1017     Chief Complaint  Patient presents with  . Chest Pain   (Consider location/radiation/quality/duration/timing/severity/associated sxs/prior Treatment) HPI  80 year old male with a significant history of CAD, CABG x3, 3 heart block status post pacemaker, pulmonary nodule, presents for evaluations of chest pain. Patient reports intermittent sharp stabbing pain to his left lateral chest usually lasting for seconds which has been ongoing for the past 2-3 days. Pain is worsened with palpation, usually used without any specific treatment. He has had similar pain like this in 2013 and was diagnosed with having a heart attack, therefore he called EMS this morning to bring patient to the ER for further evaluation. Prior to arrival patient today aspirin, 2 nitros along with his usual medication. States he noticed no difference. He also denies having fever, chills, lightheadedness, dizziness, productive cough, hemoptysis, increased shortness of breath, abdominal pain, nausea, vomiting, diarrhea, or rash. He reports performing daily activities with cardiac rehabilitation including lifting weights. He thinks this may be a muscle pull. He also mentioned attempting to perform a hemlich maneuver 2 days ago on a patron at [employer], but sts he has his sharp cp prior to that.     Past Medical History  Diagnosis Date  . Hypertension     well-controlled.  [name] CAD (coronary artery disease)     a. S/P stenting to mid RCA, prox PDA 06/1999. b. NSTEMI/CABG x 3 in 10/2011 with LIMA to LAD, SVG to PDA, and SVG to OM1.   . CKD (chronic kidney disease), stage III     a. stable with a creatinine around 1.9-2.0 followed by nephrology.  . Dyslipidemia   . Neck injury     a. C3-C4 and C4-C5 foraminal narrowing, severe  . Renal artery stenosis     a. 50% by cath 2001  . Pulmonary nodule     a. felt to  be noncancerous.  Status post followup CT scan 4 mm and stable.  . Chronic UTI     a. Followed by Dr. Risa Grill - colonized/asymptomatic - not on abx  . Atrial fibrillation     a. Post-op from CABG, on amiodarone temporarily, d/c'd 12/2011  . Acute superficial venous thrombosis of lower extremity     a. RLE after CABG, neg dopp for DVT.  [name] Prostate cancer     a. 2001 s/p TURP.  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 S/p ptca    . Cardia catherization  07-07-99  . Peripheral vascular catherization  11-24-03  . Renal circulation  10-01-03  . Stress cardiolite  05-04-05    spring 09-negative except for apical thinning, EF 68%  . Edg  07-17-1994  . Flexible sigmoidoscopy  11-03-1997  . Prostatectomy    . Lumbar spinal disk and neck fusion surgery    . Stents      X 2  . Coronary artery bypass graft  10/19/2011    Procedure: CORONARY ARTERY BYPASS GRAFTING (CABG);  Surgeon: Gaye Pollack, MD;  Location: [facility];  Service: Open Heart Surgery;  Laterality: N/A;  times three using Left Internal Mammary Artery and Right Greater Saphenouse Vein Graft harvested Endoscopically  . Cardiac surgery  10/18/12    open heart surgery  . Colonoscopy  04/12/07  .  Pacemaker insertion  03/28/12    PPM implanted for mobitz II AV block   Family History  Problem Relation Age of Onset  . Coronary artery disease Father     died @ 50  . Cancer Brother     Bladder  . Diabetes Neg Hx   . Prostate cancer Brother   . Nephritis Brother     died @ age 45.  . Other Brother     cerebral hemorrhage - died @ 1  . Other Mother     cerebral hemorrhage - died @ 29  . Heart disease Brother   . Arthritis Mother   . Breast cancer Other     niece x 2   History  Substance Use Topics  . Smoking status: Never Smoker   . Smokeless tobacco: Never Used  . Alcohol Use: Yes     Comment: Rarely    Review of Systems  All other  systems reviewed and are negative.    Allergies  Amoxicillin; Aspirin; Atarax; Ciprofloxacin; Codeine; Hydrocodone; Macrobid; Niacin; Niacin-lovastatin er; Nitrofurantoin; and Trimox  Home Medications   Current Outpatient Rx  Name  Route  Sig  Dispense  Refill  . aspirin 325 MG buffered tablet   Oral   Take 325 mg by mouth daily.           . clotrimazole-betamethasone (LOTRISONE) cream   Topical   Apply 1 application topically as needed.   45 g   3   . cyanocobalamin 1000 MCG tablet   Oral   Take 100 mcg by mouth daily.         . ergocalciferol (VITAMIN D2) 50000 UNITS capsule   Oral   Take 50,000 Units by mouth every 30 (thirty) days. 15 th of each month         . fluticasone (FLONASE) 50 MCG/ACT nasal spray   Nasal   Place 1 spray into the nose 2 (two) times daily as needed. For cold symptoms         . folic acid (FOLVITE) 1 MG tablet   Oral   Take 1 tablet (1 mg total) by mouth daily.   90 tablet   3   . metoprolol succinate (TOPROL-XL) 25 MG 24 hr tablet      Take 2 times daily by mouth.   180 tablet   3   . nitroGLYCERIN (NITROSTAT) 0.4 MG SL tablet   Sublingual   Place 1 tablet (0.4 mg total) under the tongue every 5 (five) minutes as needed for chest pain.   90 tablet   3   . potassium chloride (K-DUR) 10 MEQ tablet   Oral   Take 1 tablet (10 mEq total) by mouth daily.   90 tablet   3   . simvastatin (ZOCOR) 20 MG tablet   Oral   Take 1 tablet (20 mg total) by mouth at bedtime.   90 tablet   3    BP 152/66  Pulse 61  Temp(Src) 98.4 F (36.9 C) (Oral)  Resp 19  SpO2 99% Physical Exam  Constitutional: He is oriented to person, place, and time. He appears well-developed and well-nourished. No distress.  HENT:  Head: Atraumatic.  Mouth/Throat: Oropharynx is clear and moist.  Eyes: Conjunctivae are normal.  Neck: Normal range of motion. Neck supple. No JVD present.  Cardiovascular: Normal rate and regular rhythm.  Exam reveals no  gallop and no friction rub.   No murmur heard. Pulmonary/Chest: Effort normal and breath sounds  normal. No respiratory distress. He has no wheezes. He has no rales. He exhibits tenderness (point tenderness to left anterior chest between ribs 10-12 midclavicular line without any overlying skin changes, crepitus, emphysema, or bruising noted.).  Abdominal: Soft. There is no tenderness.  Musculoskeletal: He exhibits no edema.  Neurological: He is alert and oriented to person, place, and time.  Skin: No rash noted.  Psychiatric: He has a normal mood and affect.    ED Course  Procedures (including critical care time)  10:40 AM Patient he was reproducible left chest wall pain however has a significant past medical history of heart disease and in 2013 he has similar pain and was diagnosed with having a heart attack. His pain is intermittent. Initially he was pain-free upon arrival however he did report having a short episode of pain while I was interviewing him. He is hemodynamically stable, EKG without acute ischemic changes.  Pain not suggestive of PE or dissection.   Workup initiated, and discussed with attending.  12:28 PM Patient is chest pain-free, EKG without acute ischemic changes, troponin is normal, chest x-ray is unremarkable. I have consult to [facility] cardiology who agrees to see patient in the ED for further management.  3:59 PM Cardiology has evaluate pt and will determine admits for obs or outpt treatment.  Pt currently resting comfortably. No active chest pain.    Labs Review Labs Reviewed  CBC WITH DIFFERENTIAL - Abnormal; Notable for the following:    MCV 77.7 (*)    MCH 25.2 (*)    All other components within normal limits  POCT I-STAT, CHEM 8 - Abnormal; Notable for the following:    Creatinine, Ser 2.00 (*)    All other components within normal limits  POCT I-STAT TROPONIN I   Imaging Review Dg Chest 2 View  01/30/2013   CLINICAL DATA:  left chest pain  EXAM: CHEST  2  VIEW  COMPARISON:  03/29/2012  FINDINGS: Cardiomediastinal silhouette is stable. Status post CABG. Dual lead cardiac pacemaker is unchanged in position. No acute infiltrate or pleural effusion. Stable mild degenerative changes thoracic spine.  IMPRESSION: No active cardiopulmonary disease.   Electronically Signed   By: Lahoma Crocker M.D.   On: 01/30/2013 11:12    EKG Interpretation    Date/Time:  Friday January 30 2013 10:12:52 EST Ventricular Rate:  62 PR Interval:  225 QRS Duration: 97 QT Interval:  432 QTC Calculation: 439 R Axis:   -38 Text Interpretation:  Sinus rhythm Atrial premature complex Prolonged PR interval Left axis deviation No significant change was found Confirmed by Wyvonnia Dusky  MD, STEPHEN ([id number]) on 01/30/2013 10:19:10 AM            MDM   1. Chest pain    BP 172/77  Pulse 61  Temp(Src) 98.3 F (36.8 C) (Oral)  Resp 16  SpO2 99%  I have reviewed nursing notes and vital signs. I personally reviewed the imaging tests through PACS system  I reviewed available ER/hospitalization records thought the EMR     Domenic Moras, [place] 01/30/13 1559

## 2013-01-30 NOTE — H&P (Deleted)
Patient ID: [name] MRN: [id number], DOB/AGE: Oct 01, 1926   Admit date: 01/30/2013   Primary Physician: Adella Hare, MD Primary Cardiologist: Dr Ned Grace MD: Dr Crissie Sickles  HPI: 80 y/o with a history of CAD. He had an RCA PCI in 2001. He ultimately had a CABG x 3 10/18/13 with an LIMA-LAD, SVG-OM, SVG-PDA. His EF was 50%. He presented in March 2014 with type 2 AVB and received a MDT pacemaker. He has done well since. He goes to cardiac rehab 3 x week and does cardio for 30 minutes. Earlier this week he noted sharp, localized, brief chest pain and called the office. And was told to try Tylenol. Today he had another episode and called the office and was told to come to the hospital. He feels he had similar symptoms in 2013 before his CABG but on further questioning he admits he also had dyspnea on exertion which he does not have now.    Problem List: Past Medical History  Diagnosis Date  . Hypertension     well-controlled.  [name] CAD (coronary artery disease)     a. S/P stenting to mid RCA, prox PDA 06/1999. b. NSTEMI/CABG x 3 in 10/2011 with LIMA to LAD, SVG to PDA, and SVG to OM1.   . CKD (chronic kidney disease), stage III     a. stable with a creatinine around 1.9-2.0 followed by nephrology.  . Dyslipidemia   . Neck injury     a. C3-C4 and C4-C5 foraminal narrowing, severe  . Renal artery stenosis     a. 50% by cath 2001  . Pulmonary nodule     a. felt to be noncancerous.  Status post followup CT scan 4 mm and stable.  . Chronic UTI     a. Followed by Dr. Risa Grill - colonized/asymptomatic - not on abx  . Atrial fibrillation     a. Post-op from CABG, on amiodarone temporarily, d/c'd 12/2011  . Acute superficial venous thrombosis of lower extremity     a. RLE after CABG, neg dopp for DVT.  [name] Prostate cancer     a. 2001 s/p TURP.  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 S/p  ptca    . Cardia catherization  07-07-99  . Peripheral vascular catherization  11-24-03  . Renal circulation  10-01-03  . Stress cardiolite  05-04-05    spring 09-negative except for apical thinning, EF 68%  . Edg  07-17-1994  . Flexible sigmoidoscopy  11-03-1997  . Prostatectomy    . Lumbar spinal disk and neck fusion surgery    . Stents      X 2  . Coronary artery bypass graft  10/19/2011    Procedure: CORONARY ARTERY BYPASS GRAFTING (CABG);  Surgeon: Gaye Pollack, MD;  Location: [facility];  Service: Open Heart Surgery;  Laterality: N/A;  times three using Left Internal Mammary Artery and Right Greater Saphenouse Vein Graft harvested Endoscopically  . Cardiac surgery  10/18/12    open heart surgery  . Colonoscopy  04/12/07  . Pacemaker insertion  03/28/12    PPM implanted for mobitz II AV block     Allergies:  Allergies  Allergen Reactions  . Amoxicillin Nausea Only  . Aspirin     REACTION: upset stomach; tolerates coated aspirin  . Atarax [Hydroxyzine Hcl] Other (See Comments)    REACTION: unknown  . Ciprofloxacin Nausea Only  .  Codeine     REACTION: Stomach upset  . Hydrocodone Nausea Only  . Macrobid [Nitrofurantoin Macrocrystal] Nausea Only  . Niacin Other (See Comments)    REACTION: unknown  . Niacin-Lovastatin Er     Unsure of reaction. Taking simvastatin at home without problems  . Nitrofurantoin Other (See Comments)    REACTION: upset stomach  . Trimox [Amoxicillin Trihydrate] Other (See Comments)    REACTION: unknown     Home Medications No current facility-administered medications for this encounter.   Current Outpatient Prescriptions  Medication Sig Dispense Refill  . aspirin 325 MG buffered tablet Take 325 mg by mouth daily.        . cyanocobalamin 1000 MCG tablet Take 1,000 mcg by mouth daily.       . ergocalciferol (VITAMIN D2) 50000 UNITS capsule Take 50,000 Units by mouth every 30 (thirty) days. 15 th of each month      . folic acid (FOLVITE) 1 MG tablet  Take 1 tablet (1 mg total) by mouth daily.  90 tablet  3  . metoprolol succinate (TOPROL-XL) 25 MG 24 hr tablet Take 2 times daily by mouth.  180 tablet  3  . nitroGLYCERIN (NITROSTAT) 0.4 MG SL tablet Place 1 tablet (0.4 mg total) under the tongue every 5 (five) minutes as needed for chest pain.  90 tablet  3  . potassium chloride (K-DUR) 10 MEQ tablet Take 1 tablet (10 mEq total) by mouth daily.  90 tablet  3  . simvastatin (ZOCOR) 20 MG tablet Take 1 tablet (20 mg total) by mouth at bedtime.  90 tablet  3     Family History  Problem Relation Age of Onset  . Coronary artery disease Father     died @ 52  . Cancer Brother     Bladder  . Diabetes Neg Hx   . Prostate cancer Brother   . Nephritis Brother     died @ age 38.  . Other Brother     cerebral hemorrhage - died @ 30  . Other Mother     cerebral hemorrhage - died @ 40  . Heart disease Brother   . Arthritis Mother   . Breast cancer Other     niece x 2     History   Social History  . Marital Status: Married    Spouse Name: N/A    Number of Children: 2  . Years of Education: N/A   Occupational History  . 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     Social History Main Topics  . Smoking status: Never Smoker   . Smokeless tobacco: Never Used  . Alcohol Use: Yes     Comment: Rarely  . Drug Use: No  . Sexual Activity: Not on file   Other Topics Concern  . Not on file   Social History Narrative   HSG, 1 year college.  married '52 - 3 years, divorced; married '56 - 3 years divorced; married '46-12 yrs - divorced; married '75 -. 1 son '57; 1 daughter - '53; 1 grandchild.  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  Very happily married.  End of life care: yes CPR, no long term mechanical ventilation, no heroic measures.     Review of Systems: General: negative for chills, fever, night sweats  or weight changes.  Cardiovascular: negative for chest pain, dyspnea on exertion, edema, orthopnea, palpitations, paroxysmal nocturnal dyspnea or shortness of breath Dermatological: negative for rash Respiratory: negative for cough or wheezing Urologic: negative for hematuria Abdominal: negative for nausea, vomiting, diarrhea, bright red blood per rectum, melena, or hematemesis Neurologic: negative for visual changes, syncope, or dizziness All other systems reviewed and are otherwise negative except as noted above.  Physical Exam: Blood pressure 172/77, pulse 61, temperature 98.3 F (36.8 C), temperature source Oral, resp. rate 16, SpO2 99.00%.  General appearance: alert, cooperative and no distress Neck: no carotid bruit and no JVD Lungs: clear to auscultation bilaterally Heart: regular rate and rhythm, S1, S2 normal, no murmur, click, rub or gallop Abdomen: soft, non-tender; bowel sounds normal; no masses,  no organomegaly Extremities: extremities normal, atraumatic, no cyanosis or edema Pulses: 2+ and symmetric Skin: Skin color, texture, turgor normal. No rashes or lesions Neurologic: Grossly normal    Labs:   Results for orders placed during the hospital encounter of 01/30/13 (from the past 24 hour(s))  CBC WITH DIFFERENTIAL     Status: Abnormal   Collection Time    01/30/13 10:26 AM      Result Value Range   WBC 4.6  4.0 - 10.5 K/uL   RBC 5.28  4.22 - 5.81 MIL/uL   Hemoglobin 13.3  13.0 - 17.0 g/dL   HCT 41.0  39.0 - 52.0 %   MCV 77.7 (*) 78.0 - 100.0 fL   MCH 25.2 (*) 26.0 - 34.0 pg   MCHC 32.4  30.0 - 36.0 g/dL   RDW 15.1  11.5 - 15.5 %   Platelets 177  150 - 400 K/uL   Neutrophils Relative % 66  43 - 77 %   Neutro Abs 3.1  1.7 - 7.7 K/uL   Lymphocytes Relative 21  12 - 46 %   Lymphs Abs 1.0  0.7 - 4.0 K/uL   Monocytes Relative 10  3 - 12 %   Monocytes Absolute 0.5  0.1 - 1.0 K/uL   Eosinophils Relative 2  0 - 5 %   Eosinophils Absolute 0.1  0.0 - 0.7 K/uL    Basophils Relative 1  0 - 1 %   Basophils Absolute 0.0  0.0 - 0.1 K/uL  POCT I-STAT TROPONIN I     Status: None   Collection Time    01/30/13 10:31 AM      Result Value Range   Troponin i, poc 0.01  0.00 - 0.08 ng/mL   Comment 3           POCT I-STAT, CHEM 8     Status: Abnormal   Collection Time    01/30/13 10:33 AM      Result Value Range   Sodium 140  137 - 147 mEq/L   Potassium 4.6  3.7 - 5.3 mEq/L   Chloride 105  96 - 112 mEq/L   BUN 22  6 - 23 mg/dL   Creatinine, Ser 2.00 (*) 0.50 - 1.35 mg/dL   Glucose, Bld 75  70 - 99 mg/dL   Calcium, Ion 1.25  1.13 - 1.30 mmol/L   TCO2 24  0 - 100 mmol/L   Hemoglobin 15.0  13.0 - 17.0 g/dL   HCT 44.0  39.0 - 52.0 %     Radiology/Studies: Dg Chest 2 View  01/30/2013   CLINICAL DATA:  left chest pain  EXAM: CHEST  2 VIEW  COMPARISON:  03/29/2012  FINDINGS: Cardiomediastinal silhouette is stable. Status  post CABG. Dual lead cardiac pacemaker is unchanged in position. No acute infiltrate or pleural effusion. Stable mild degenerative changes thoracic spine.  IMPRESSION: No active cardiopulmonary disease.   Electronically Signed   By: Lahoma Crocker M.D.   On: 01/30/2013 11:12    EKG:NSR without acute changes  ASSESSMENT AND PLAN:  Principal Problem:   Chest pain with moderate risk for cardiac etiology Active Problems:   CAD - CABG Oct 2013   Chronic renal impairment, stage 3 (moderate)   Dyslipidemia   HYPERTENSION   PROSTATE CANCER, HX OF- surgery 2001   AV block, 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   PLAN: Will review with MD, ? Admit for OBS vs prn NSAID and OP Myoview.    Henri Medal, PA-C 01/30/2013, 3:34 PM

## 2013-01-30 NOTE — Discharge Instructions (Signed)
Cardiology will contact you about details of a stress test on Monday of next week. If your symptoms should return or worsen, you develop progressive Chest pain, shortness or breath, sweating, Nausea/Vomiting then please call 911 or return to the Emergency department. Avoid strenuous activity until follow up with Cardiology.

## 2013-01-31 NOTE — ED Provider Notes (Signed)
Medical screening examination/treatment/procedure(s) were performed by non-physician practitioner and as supervising physician I was immediately available for consultation/collaboration.  EKG Interpretation    Date/Time:  Friday January 30 2013 10:12:52 EST Ventricular Rate:  62 PR Interval:  225 QRS Duration: 97 QT Interval:  432 QTC Calculation: 439 R Axis:   -38 Text Interpretation:  Sinus rhythm Atrial premature complex Prolonged PR interval Left axis deviation No significant change was found Confirmed by Wyvonnia Dusky  MD, STEPHEN ([id number]) on 01/30/2013 10:19:10 AM             Veryl Speak, MD 01/31/13 [id number]

## 2013-02-03 ENCOUNTER — Encounter: Payer: Self-pay | Admitting: Internal Medicine

## 2013-02-04 ENCOUNTER — Ambulatory Visit (INDEPENDENT_AMBULATORY_CARE_PROVIDER_SITE_OTHER): Payer: Medicare Other

## 2013-02-04 DIAGNOSIS — Z23 Encounter for immunization: Secondary | ICD-10-CM

## 2013-02-05 ENCOUNTER — Encounter: Payer: Self-pay | Admitting: Cardiovascular Disease

## 2013-02-05 ENCOUNTER — Ambulatory Visit (INDEPENDENT_AMBULATORY_CARE_PROVIDER_SITE_OTHER): Payer: Medicare Other | Admitting: Cardiovascular Disease

## 2013-02-05 VITALS — BP 118/70 | HR 62 | Resp 16 | Wt 181.8 lb

## 2013-02-05 DIAGNOSIS — I2510 Atherosclerotic heart disease of native coronary artery without angina pectoris: Secondary | ICD-10-CM

## 2013-02-05 DIAGNOSIS — R079 Chest pain, unspecified: Secondary | ICD-10-CM

## 2013-02-05 NOTE — Patient Instructions (Signed)
Your physician recommends that you keep your follow-up with Dr. Burt Knack on 3/2 unless we notify you otherwise.  Your physician recommends that you keep your stress test appointment scheduled for 2/9.  Your physician recommends that you continue on your current medications as directed. Please refer to the Current Medication list given to you today.

## 2013-02-05 NOTE — Progress Notes (Signed)
HPI:  80 year old gentleman presenting for followup evaluation. The patient is followed for coronary artery disease. He underwent multivessel CABG in 2013 after presenting with non-ST elevation MI. He had previously undergone coronary stenting dating back to 2001. He was found to have severe three-vessel obstructive disease. Left ventricular function was normal. He was bypassed with the LIMA to LAD, vein graft obtuse marginal, and vein graft to PDA. He also required permanent pacing in 2014 after presenting with second degree type II AV block.  The patient was just evaluated in the emergency department on January 23. He had atypical chest pain it was felt to be musculoskeletal. Recommendations were made for an outpatient stress nuclear study. He presents today for followup. Labs reviewed demonstrated a troponin of 0.01 on serial blood draws. Creatinine was 2.0 which is been stable for him. EKG showed sinus rhythm with PACs, no significant ST or T-wave changes. Last lipids 11/27/2012 showed cholesterol 114, triglycerides 71, HDL 34, and LDL 66.  Drives fleeting sharp pains that feel like "a pencil sticking in his chest." He is concerned because he had similar symptoms prior to his bypass surgery. At the time of bypass, he also had fairly marked exertional dyspnea. This symptom has resolved since his surgery. He denies edema, orthopnea, PND, or palpitations. He is not having exertional chest pain or pressure.  Outpatient Encounter Prescriptions as of 02/05/2013  Medication Sig  . aspirin 325 MG buffered tablet Take 325 mg by mouth daily.    . cyanocobalamin 1000 MCG tablet Take 1,000 mcg by mouth daily.   . ergocalciferol (VITAMIN D2) 50000 UNITS capsule Take 50,000 Units by mouth every 30 (thirty) days. 15 th of each month  . folic acid (FOLVITE) 1 MG tablet Take 1 tablet (1 mg total) by mouth daily.  . metoprolol succinate (TOPROL-XL) 25 MG 24 hr tablet Take 2 times daily by mouth.  . nitroGLYCERIN  (NITROSTAT) 0.4 MG SL tablet Place 1 tablet (0.4 mg total) under the tongue every 5 (five) minutes as needed for chest pain.  . potassium chloride (K-DUR) 10 MEQ tablet Take 1 tablet (10 mEq total) by mouth daily.  . simvastatin (ZOCOR) 20 MG tablet Take 1 tablet (20 mg total) by mouth at bedtime.    Allergies  Allergen Reactions  . Amoxicillin Nausea Only  . Aspirin     REACTION: upset stomach; tolerates coated aspirin  . Atarax [Hydroxyzine Hcl] Other (See Comments)    REACTION: unknown  . Ciprofloxacin Nausea Only  . Codeine     REACTION: Stomach upset  . Hydrocodone Nausea Only  . Macrobid [Nitrofurantoin Macrocrystal] Nausea Only  . Niacin Other (See Comments)    REACTION: unknown  . Niacin-Lovastatin Er     Unsure of reaction. Taking simvastatin at home without problems  . Nitrofurantoin Other (See Comments)    REACTION: upset stomach  . Trimox [Amoxicillin Trihydrate] Other (See Comments)    REACTION: unknown    Past Medical History  Diagnosis Date  . Hypertension     well-controlled.  [name] CAD (coronary artery disease)     a. S/P stenting to mid RCA, prox PDA 06/1999. b. NSTEMI/CABG x 3 in 10/2011 with LIMA to LAD, SVG to PDA, and SVG to OM1.   . CKD (chronic kidney disease), stage III     a. stable with a creatinine around 1.9-2.0 followed by nephrology.  . Dyslipidemia   . Neck injury     a. C3-C4 and C4-C5 foraminal narrowing, severe  .  Renal artery stenosis     a. 50% by cath 2001  . Pulmonary nodule     a. felt to be noncancerous.  Status post followup CT scan 4 mm and stable.  . Chronic UTI     a. Followed by Dr. Risa Grill - colonized/asymptomatic - not on abx  . Atrial fibrillation     a. Post-op from CABG, on amiodarone temporarily, d/c'd 12/2011  . Acute superficial venous thrombosis of lower extremity     a. RLE after CABG, neg dopp for DVT.  [name] Prostate cancer     a. 2001 s/p TURP.  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 Coronary artery disease status post CABG. The patient is having atypical chest pain. A nuclear stress test has been arranged February 9. Will determine followup after his stress test is completed. I think since his symptoms are similar to those prior to bypass, it is reasonable to proceed with stress testing to rule out early graft failure. I reassured him that he could start back on his exercise program as long as he is not having exertional symptoms.  2. Hypertension. Blood pressure is well controlled on metoprolol succinate.  3. Hyperlipidemia. Lipids reviewed as above. Continue simvastatin 20 mg.  If his nuclear scan is low risk, I will plan on seeing him back in one year. He has an appointment scheduled in March and we will leave this on the books for now until we see the results of the stress test.  [name] 02/05/2013 9:45 AM

## 2013-02-11 ENCOUNTER — Encounter: Payer: Self-pay | Admitting: Cardiovascular Disease

## 2013-02-16 ENCOUNTER — Ambulatory Visit (HOSPITAL_COMMUNITY): Payer: Medicare Other | Attending: Cardiovascular Disease | Admitting: Radiology

## 2013-02-16 VITALS — BP 131/86 | HR 74 | Ht 72.0 in | Wt 179.0 lb

## 2013-02-16 DIAGNOSIS — I10 Essential (primary) hypertension: Secondary | ICD-10-CM | POA: Insufficient documentation

## 2013-02-16 DIAGNOSIS — R079 Chest pain, unspecified: Secondary | ICD-10-CM | POA: Insufficient documentation

## 2013-02-16 DIAGNOSIS — Z9861 Coronary angioplasty status: Secondary | ICD-10-CM | POA: Insufficient documentation

## 2013-02-16 DIAGNOSIS — Z8249 Family history of ischemic heart disease and other diseases of the circulatory system: Secondary | ICD-10-CM | POA: Insufficient documentation

## 2013-02-16 DIAGNOSIS — I2510 Atherosclerotic heart disease of native coronary artery without angina pectoris: Secondary | ICD-10-CM

## 2013-02-16 DIAGNOSIS — I252 Old myocardial infarction: Secondary | ICD-10-CM | POA: Insufficient documentation

## 2013-02-16 MED ORDER — REGADENOSON 0.4 MG/5ML IV SOLN
0.4000 mg | Freq: Once | INTRAVENOUS | Status: AC
  Administered 2013-02-16: 0.4 mg via INTRAVENOUS

## 2013-02-16 MED ORDER — TECHNETIUM TC 99M SESTAMIBI GENERIC - CARDIOLITE
11.0000 | Freq: Once | INTRAVENOUS | Status: AC | PRN
Start: 2013-02-16 — End: 2013-02-16
  Administered 2013-02-16: 11 via INTRAVENOUS

## 2013-02-16 MED ORDER — TECHNETIUM TC 99M SESTAMIBI GENERIC - CARDIOLITE
33.0000 | Freq: Once | INTRAVENOUS | Status: AC | PRN
  Administered 2013-02-16: 33 via INTRAVENOUS

## 2013-02-16 NOTE — Progress Notes (Signed)
[facility] [address] [place], [place] [address] [phone]    Cardiology Nuclear Med Study  STEPAN BRODERSON is a 80 y.o. male     MRN : [id number]     DOB: 1926/09/06  Procedure Date: 02/16/2013  Nuclear Med Background Indication for Stress Test:  Evaluation for Ischemia, Graft Patency and Post Hospital:1/15 with Chest Pain History:  CAD, MI, Cath, CABG 2013, Sten 2001 to RCA, PTVP, Echo 2013 EF 50-55%, MPI 2010 (normal) EF 68% Cardiac Risk Factors: Family History - CAD, Hypertension and Lipids  Symptoms:  Chest Pain (last date of chest discomfort was Friday morning)   Nuclear Pre-Procedure Caffeine/Decaff Intake:  None NPO After: 7:00pm   Lungs:  clear O2 Sat: 97% on room air. IV 0.9% NS with Angio Cath:  20g  IV Site: R Hand  IV Started by:  Matilde Haymaker, RN  Chest Size (in):  44 Cup Size: n/a  Height: 6' (1.829 m)  Weight:  179 lb (81.194 kg)  BMI:  Body mass index is 24.27 kg/(m^2). Tech Comments:  Toprol taken at [place] 1 or 2 day study: 1 day  Stress Test Type:  Treadmill/Lexiscan  Reading MD: n/a  Order Authorizing Provider:  Legrand Como Cooper,MD  Resting Radionuclide: Technetium 67m Sestamibi  Resting Radionuclide Dose: 11.0 mCi   Stress Radionuclide:  Technetium 49m Sestamibi  Stress Radionuclide Dose: 33.0 mCi           Stress Protocol Rest HR: 74 Stress HR: 81  Rest BP: 131/86 Stress BP: 143/71  Exercise Time (min): n/a METS: n/a           Dose of Adenosine (mg):  n/a Dose of Lexiscan: 0.4 mg  Dose of Atropine (mg): n/a Dose of Dobutamine: n/a mcg/kg/min (at max HR)  Stress Test Technologist: Glade Lloyd, BS-ES  Nuclear Technologist:  Charlton Amor, CNMT     Rest Procedure:  Myocardial perfusion imaging was performed at rest 45 minutes following the intravenous administration of Technetium 35m Sestamibi. Rest ECG: NSR with non-specific ST-T wave changes  Stress Procedure:  The patient  received IV Lexiscan 0.4 mg over 15-seconds with concurrent low level exercise and then Technetium 56m Sestamibi was injected at 30-seconds while the patient continued walking one more minute.  Quantitative spect images were obtained after a 45-minute delay.  Patient developed a slight headache toward end of recovery and BP significantly increased to 199/92.  Checked patients BP before he left the office and was 175/88.  Instructed patient to take it for one week at the same time everyday and if it stays elevated to call his physician.  Stress ECG: No significant change from baseline ECG  QPS Raw Data Images:  Normal; no motion artifact; normal heart/lung ratio. Stress Images:  There is decreased uptake in the lateral wall. Rest Images:  There is decreased uptake in the lateral wall. Subtraction (SDS):  These findings are consistent with ischemia. Transient Ischemic Dilatation (Normal <1.22):  1.03 Lung/Heart Ratio (Normal <0.45):  0.33  Quantitative Gated Spect Images QGS EDV:  109 ml QGS ESV:  53 ml  Impression Exercise Capacity:  Lexiscan with low level exercise. BP Response:  Normal blood pressure response. Clinical Symptoms:  No chest pain. ECG Impression:  No significant ST segment change suggestive of ischemia. Comparison with Prior Nuclear Study: Since prior myoview of 08/10/08, there is new partially reversible ischemia of the inferolateral wall, and the EF has decreased from  68% to 52%.  Overall Impression:  Intermediate risk stress nuclear study.  There is a medium size defect of moderate severity and partial reversibility involving the basal and mid-inferior and basal and mid-inferolateral segments. This is new since 2010.  LV Ejection Fraction: 52%.  LV Wall Motion:  There is mild inferior hypokinesis.  [name]

## 2013-02-17 ENCOUNTER — Encounter: Payer: Self-pay | Admitting: Internal Medicine

## 2013-02-17 ENCOUNTER — Ambulatory Visit (INDEPENDENT_AMBULATORY_CARE_PROVIDER_SITE_OTHER): Payer: Medicare Other | Admitting: Internal Medicine

## 2013-02-17 VITALS — BP 112/50 | HR 71 | Temp 97.3°F | Wt 181.4 lb

## 2013-02-17 DIAGNOSIS — J069 Acute upper respiratory infection, unspecified: Secondary | ICD-10-CM

## 2013-02-17 DIAGNOSIS — I2510 Atherosclerotic heart disease of native coronary artery without angina pectoris: Secondary | ICD-10-CM

## 2013-02-17 DIAGNOSIS — J310 Chronic rhinitis: Secondary | ICD-10-CM

## 2013-02-17 NOTE — Patient Instructions (Signed)
No evidence of any bacterial infection. 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 No need for antibiotics  Plan  Daytime use Robitussin DM or the generic equivalent every 4-6 hours  Nighttime take 1-2 tsp of promethazine/codeine cough syrup  Take a non-sedating antihistamine, e.g. claritin generic - this can be taken on a regular basis for chronic sniffles.  Vitamin C - 1500 mg daily  Alarm symptoms: fever, purulent drainage (colored, thick, snot), shortness of breath.   Upper Respiratory Infection, Adult An upper respiratory infection (URI) is also sometimes known as the common cold. The upper respiratory tract includes the nose, sinuses, throat, trachea, and bronchi. Bronchi are the airways leading to the lungs. Most people improve within 1 week, but symptoms can last up to 2 weeks. A residual cough may last even longer.  CAUSES Many different viruses can infect the tissues lining the upper respiratory tract. The tissues become irritated and inflamed and often become very moist. Mucus production is also common. A cold is contagious. You can easily spread the virus to others by oral contact. This includes kissing, sharing a glass, coughing, or sneezing. Touching your mouth or nose and then touching a surface, which is then touched by another person, can also spread the virus. SYMPTOMS  Symptoms typically develop 1 to 3 days after you come in contact with a cold virus. Symptoms vary from person to person. They may include:  Runny nose.  Sneezing.  Nasal congestion.  Sinus irritation.  Sore throat.  Loss of voice (laryngitis).  Cough.  Fatigue.  Muscle aches.  Loss of appetite.  Headache.  Low-grade fever. DIAGNOSIS  You might diagnose your own cold based on familiar symptoms, since most people get a cold 2 to 3 times a year. Your caregiver can confirm this based on your exam. Most importantly, your  caregiver can check that your symptoms are not due to another disease such as strep throat, sinusitis, pneumonia, asthma, or epiglottitis. Blood tests, throat tests, and X-rays are not necessary to diagnose a common cold, but they may sometimes be helpful in excluding other more serious diseases. Your caregiver will decide if any further tests are required. RISKS AND COMPLICATIONS  You may be at risk for a more severe case of the common cold if you smoke cigarettes, have chronic heart disease (such as heart failure) or lung disease (such as asthma), or if you have a weakened immune system. The very young and very old are also at risk for more serious infections. Bacterial sinusitis, middle ear infections, and bacterial pneumonia can complicate the common cold. The common cold can worsen asthma and chronic obstructive pulmonary disease (COPD). Sometimes, these complications can require emergency medical care and may be life-threatening. PREVENTION  The best way to protect against getting a cold is to practice good hygiene. Avoid oral or hand contact with people with cold symptoms. Wash your hands often if contact occurs. There is no clear evidence that vitamin C, vitamin E, echinacea, or exercise reduces the chance of developing a cold. However, it is always recommended to get plenty of rest and practice good nutrition. TREATMENT  Treatment is directed at relieving symptoms. There is no cure. Antibiotics are not effective, because the infection is caused by a virus, not by bacteria. Treatment may include:  Increased fluid intake. Sports drinks offer valuable electrolytes, sugars, and fluids.  Breathing heated mist or steam (vaporizer or shower).  Eating  chicken soup or other clear broths, and maintaining good nutrition.  Getting plenty of rest.  Using gargles or lozenges for comfort.  Controlling fevers with ibuprofen or acetaminophen as directed by your caregiver.  Increasing usage of your  inhaler if you have asthma. Zinc gel and zinc lozenges, taken in the first 24 hours of the common cold, can shorten the duration and lessen the severity of symptoms. Pain medicines may help with fever, muscle aches, and throat pain. A variety of non-prescription medicines are available to treat congestion and runny nose. Your caregiver can make recommendations and may suggest nasal or lung inhalers for other symptoms.  HOME CARE INSTRUCTIONS   Only take over-the-counter or prescription medicines for pain, discomfort, or fever as directed by your caregiver.  Use a warm mist humidifier or inhale steam from a shower to increase air moisture. This may keep secretions moist and make it easier to breathe.  Drink enough water and fluids to keep your urine clear or pale yellow.  Rest as needed.  Return to work when your temperature has returned to normal or as your caregiver advises. You may need to stay home longer to avoid infecting others. You can also use a face mask and careful hand washing to prevent spread of the virus. SEEK MEDICAL CARE IF:   After the first few days, you feel you are getting worse rather than better.  You need your caregiver's advice about medicines to control symptoms.  You develop chills, worsening shortness of breath, or brown or red sputum. These may be signs of pneumonia.  You develop yellow or brown nasal discharge or pain in the face, especially when you bend forward. These may be signs of sinusitis.  You develop a fever, swollen neck glands, pain with swallowing, or white areas in the back of your throat. These may be signs of strep throat. SEEK IMMEDIATE MEDICAL CARE IF:   You have a fever.  You develop severe or persistent headache, ear pain, sinus pain, or chest pain.  You develop wheezing, a prolonged cough, cough up blood, or have a change in your usual mucus (if you have chronic lung disease).  You develop sore muscles or a stiff neck. Document  Released: 06/20/2000 Document Revised: 03/19/2011 Document Reviewed: 04/28/2010 [facility] Patient Information 2014 [place], [place].

## 2013-02-17 NOTE — Progress Notes (Signed)
Pre visit review using our clinic review tool, if applicable. No additional management support is needed unless otherwise documented below in the visit note. 

## 2013-02-17 NOTE — Progress Notes (Signed)
   Subjective:    Patient ID: [name], male    DOB: 16-Jul-1926, 80 y.o.   MRN: [id number]  HPI [name] has had chronic rhinorrhea since heart surgery. In the last week he has had clear drainage, increasing cough with clear sputum, no fever, no rigors, no increased SOB, no N/V with a good appetite. He tried Nyquil but this left him feeling very dry. He has not tried any other cough preparations.   PMH, FamHx and SocHx reviewed for any changes and relevance.  Current Outpatient Prescriptions on File Prior to Visit  Medication Sig Dispense Refill  . aspirin 325 MG buffered tablet Take 325 mg by mouth daily.        . cyanocobalamin 1000 MCG tablet Take 1,000 mcg by mouth daily.       . ergocalciferol (VITAMIN D2) 50000 UNITS capsule Take 50,000 Units by mouth every 30 (thirty) days. 15 th of each month      . folic acid (FOLVITE) 1 MG tablet Take 1 tablet (1 mg total) by mouth daily.  90 tablet  3  . metoprolol succinate (TOPROL-XL) 25 MG 24 hr tablet Take 2 times daily by mouth.  180 tablet  3  . nitroGLYCERIN (NITROSTAT) 0.4 MG SL tablet Place 1 tablet (0.4 mg total) under the tongue every 5 (five) minutes as needed for chest pain.  90 tablet  3  . potassium chloride (K-DUR) 10 MEQ tablet Take 1 tablet (10 mEq total) by mouth daily.  90 tablet  3  . simvastatin (ZOCOR) 20 MG tablet Take 1 tablet (20 mg total) by mouth at bedtime.  90 tablet  3   No current facility-administered medications on file prior to visit.       Review of Systems System review is negative for any constitutional, cardiac, pulmonary, GI or neuro symptoms or complaints other than as described in the HPI.     Objective:   Physical Exam Filed Vitals:   02/17/13 0834  BP: 112/50  Pulse: 71  Temp: 97.3 F (36.3 C)   Wt Readings from Last 3 Encounters:  02/17/13 181 lb 6.4 oz (82.283 kg)  02/16/13 179 lb (81.194 kg)  02/05/13 181 lb 12.8 oz (82.464 kg)   BP Readings from Last 3 Encounters:    02/17/13 112/50  02/16/13 131/86  02/05/13 118/70   Gen'l - WNWD man looking younger than his stated chronologic age, in no distress HEENT- No tenderness to percussion over the facial sinus Nodes - negative cervical adenopathy Cor - 2+ radial, RRR Pulm - CTAP Neuro - A&O x 3       Assessment & Plan:  Viral URI - No evidence of any bacterial infection. 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 No need for antibiotics  Plan  Daytime use Robitussin DM or the generic equivalent every 4-6 hours  Nighttime take 1-2 tsp of promethazine/codeine cough syrup  Take a non-sedating antihistamine, e.g. claritin generic - this can be taken on a regular basis for chronic sniffles.  Vitamin C - 1500 mg daily  Alarm symptoms: fever, purulent drainage (colored, thick, snot), shortness of breath.

## 2013-02-18 MED ORDER — PROMETHAZINE-CODEINE 6.25-10 MG/5ML PO SYRP: 5.0000 mL | ORAL_SOLUTION | ORAL | Status: DC | PRN

## 2013-02-18 NOTE — Assessment & Plan Note (Signed)
Persistent rhinitis exacerbated by possible viral URI.   Plan OTC non-sedating antihistamine of choice. Cautioned re: urinary retention.

## 2013-02-23 ENCOUNTER — Telehealth: Payer: Self-pay | Admitting: Cardiovascular Disease

## 2013-02-23 NOTE — Telephone Encounter (Signed)
New message     Returning someone's call to get stress test results

## 2013-02-23 NOTE — Telephone Encounter (Signed)
Pt called because he said Dr. Burt Knack called him and left a message  last Friday. Pt states that if Md call him this morning he can call  his cell phone  [phone]. he will go to cardiac rehab this morning at 10:30 AM.

## 2013-02-26 ENCOUNTER — Encounter: Payer: Self-pay | Admitting: Nurse Practitioner

## 2013-02-26 ENCOUNTER — Telehealth: Payer: Self-pay | Admitting: Nurse Practitioner

## 2013-02-26 DIAGNOSIS — I25810 Atherosclerosis of coronary artery bypass graft(s) without angina pectoris: Secondary | ICD-10-CM

## 2013-02-26 NOTE — Telephone Encounter (Signed)
Spoke with patient regarding scheduling cardiac catheterization per Dr. Burt Knack.  Dr. Burt Knack reviewed patient's myoview results with him and recommended cath and patient is agreeable to plan.  Patient is scheduled for cath at [facility] on 3/4 at 0830, arrive at 0630.  I reviewed pre-cath instructions with patient on the telephone and put a copy in the mail to him.  Patient is scheduled for pre-cath labs on 2/25 here at [facility].  Orders in epic.  I advised patient to call office with questions or concerns, particularly if he experiences SOB, chest discomfort, or any other symptoms that concern him.  Patient verbalized understanding and agreement with all instructions.

## 2013-02-27 ENCOUNTER — Ambulatory Visit: Payer: Medicare Other | Admitting: Cardiovascular Disease

## 2013-03-02 ENCOUNTER — Encounter (HOSPITAL_COMMUNITY): Payer: Self-pay

## 2013-03-04 ENCOUNTER — Other Ambulatory Visit (INDEPENDENT_AMBULATORY_CARE_PROVIDER_SITE_OTHER): Payer: Medicare Other

## 2013-03-04 DIAGNOSIS — I25810 Atherosclerosis of coronary artery bypass graft(s) without angina pectoris: Secondary | ICD-10-CM

## 2013-03-04 LAB — CBC WITH DIFFERENTIAL/PLATELET
Basophils Absolute: 0 10*3/uL (ref 0.0–0.1)
Basophils Relative: 0.4 % (ref 0.0–3.0)
EOS ABS: 0.1 10*3/uL (ref 0.0–0.7)
Eosinophils Relative: 1.8 % (ref 0.0–5.0)
HCT: 41.1 % (ref 39.0–52.0)
Hemoglobin: 12.8 g/dL — ABNORMAL LOW (ref 13.0–17.0)
LYMPHS ABS: 1.2 10*3/uL (ref 0.7–4.0)
Lymphocytes Relative: 23.2 % (ref 12.0–46.0)
MCHC: 31.1 g/dL (ref 30.0–36.0)
MCV: 79.6 fl (ref 78.0–100.0)
MONO ABS: 0.4 10*3/uL (ref 0.1–1.0)
Monocytes Relative: 7.3 % (ref 3.0–12.0)
Neutro Abs: 3.4 10*3/uL (ref 1.4–7.7)
Neutrophils Relative %: 67.3 % (ref 43.0–77.0)
PLATELETS: 224 10*3/uL (ref 150.0–400.0)
RBC: 5.16 Mil/uL (ref 4.22–5.81)
RDW: 16 % — ABNORMAL HIGH (ref 11.5–14.6)
WBC: 5 10*3/uL (ref 4.5–10.5)

## 2013-03-04 LAB — BASIC METABOLIC PANEL
BUN: 32 mg/dL — AB (ref 6–23)
CHLORIDE: 107 meq/L (ref 96–112)
CO2: 25 meq/L (ref 19–32)
CREATININE: 2.1 mg/dL — AB (ref 0.4–1.5)
Calcium: 9 mg/dL (ref 8.4–10.5)
GFR: 31.92 mL/min — ABNORMAL LOW (ref >60.00)
GLUCOSE: 87 mg/dL (ref 70–99)
Potassium: 4.3 mEq/L (ref 3.5–5.1)
Sodium: 138 mEq/L (ref 135–145)

## 2013-03-04 LAB — PROTIME-INR
INR: 1.2 ratio — AB (ref 0.8–1.0)
PROTHROMBIN TIME: 12.2 s (ref 10.2–12.4)

## 2013-03-09 ENCOUNTER — Encounter: Payer: Self-pay | Admitting: Internal Medicine

## 2013-03-09 ENCOUNTER — Ambulatory Visit: Payer: Medicare Other | Admitting: Cardiovascular Disease

## 2013-03-10 ENCOUNTER — Other Ambulatory Visit: Payer: Self-pay | Admitting: Cardiovascular Disease

## 2013-03-10 DIAGNOSIS — I2510 Atherosclerotic heart disease of native coronary artery without angina pectoris: Secondary | ICD-10-CM

## 2013-03-11 ENCOUNTER — Ambulatory Visit (HOSPITAL_COMMUNITY)
Admission: RE | Admit: 2013-03-11 | Discharge: 2013-03-11 | Disposition: A | Discharge status: Home / Self Care | Payer: Medicare Other | Source: Ambulatory Visit | Attending: Cardiovascular Disease | Admitting: Cardiovascular Disease

## 2013-03-11 ENCOUNTER — Encounter (HOSPITAL_COMMUNITY)
Admission: RE | Disposition: A | Discharge status: Home / Self Care | Payer: Self-pay | Source: Ambulatory Visit | Attending: Cardiovascular Disease

## 2013-03-11 DIAGNOSIS — I252 Old myocardial infarction: Secondary | ICD-10-CM | POA: Insufficient documentation

## 2013-03-11 DIAGNOSIS — I25810 Atherosclerosis of coronary artery bypass graft(s) without angina pectoris: Secondary | ICD-10-CM | POA: Insufficient documentation

## 2013-03-11 DIAGNOSIS — N183 Chronic kidney disease, stage 3 unspecified: Secondary | ICD-10-CM | POA: Insufficient documentation

## 2013-03-11 DIAGNOSIS — I129 Hypertensive chronic kidney disease with stage 1 through stage 4 chronic kidney disease, or unspecified chronic kidney disease: Secondary | ICD-10-CM | POA: Insufficient documentation

## 2013-03-11 DIAGNOSIS — I2510 Atherosclerotic heart disease of native coronary artery without angina pectoris: Secondary | ICD-10-CM | POA: Insufficient documentation

## 2013-03-11 DIAGNOSIS — Z86718 Personal history of other venous thrombosis and embolism: Secondary | ICD-10-CM | POA: Insufficient documentation

## 2013-03-11 DIAGNOSIS — Z7982 Long term (current) use of aspirin: Secondary | ICD-10-CM | POA: Insufficient documentation

## 2013-03-11 DIAGNOSIS — Z8546 Personal history of malignant neoplasm of prostate: Secondary | ICD-10-CM | POA: Insufficient documentation

## 2013-03-11 DIAGNOSIS — E785 Hyperlipidemia, unspecified: Secondary | ICD-10-CM | POA: Insufficient documentation

## 2013-03-11 DIAGNOSIS — Z950 Presence of cardiac pacemaker: Secondary | ICD-10-CM | POA: Insufficient documentation

## 2013-03-11 HISTORY — PX: LEFT HEART CATHETERIZATION WITH CORONARY/GRAFT ANGIOGRAM: SHX5450

## 2013-03-11 SURGERY — LEFT HEART CATHETERIZATION WITH CORONARY/GRAFT ANGIOGRAM
Anesthesia: LOCAL

## 2013-03-11 MED ORDER — HEPARIN SODIUM (PORCINE) 1000 UNIT/ML IJ SOLN
INTRAMUSCULAR | Status: AC
  Filled 2013-03-11: qty 1

## 2013-03-11 MED ORDER — HEPARIN (PORCINE) IN NACL 2-0.9 UNIT/ML-% IJ SOLN
INTRAMUSCULAR | Status: AC
  Filled 2013-03-11: qty 500

## 2013-03-11 MED ORDER — SODIUM CHLORIDE 0.9 % IJ SOLN: 3.0000 mL | Freq: Two times a day (BID) | INTRAMUSCULAR | Status: DC

## 2013-03-11 MED ORDER — SODIUM CHLORIDE 0.9 % IV SOLN: 250.0000 mL | INTRAVENOUS | Status: DC | PRN

## 2013-03-11 MED ORDER — LIDOCAINE HCL (PF) 1 % IJ SOLN
INTRAMUSCULAR | Status: AC
  Filled 2013-03-11: qty 30

## 2013-03-11 MED ORDER — MIDAZOLAM HCL 2 MG/2ML IJ SOLN
INTRAMUSCULAR | Status: AC
  Filled 2013-03-11: qty 2

## 2013-03-11 MED ORDER — SODIUM CHLORIDE 0.9 % IV SOLN
INTRAVENOUS | Status: DC
  Administered 2013-03-11 (×2): via INTRAVENOUS

## 2013-03-11 MED ORDER — SODIUM CHLORIDE 0.9 % IV SOLN: 1.0000 mL/kg/h | INTRAVENOUS | Status: DC

## 2013-03-11 MED ORDER — ONDANSETRON HCL 4 MG/2ML IJ SOLN: 4.0000 mg | Freq: Four times a day (QID) | INTRAMUSCULAR | Status: DC | PRN

## 2013-03-11 MED ORDER — HEPARIN (PORCINE) IN NACL 2-0.9 UNIT/ML-% IJ SOLN
INTRAMUSCULAR | Status: AC
  Filled 2013-03-11: qty 1500

## 2013-03-11 MED ORDER — VERAPAMIL HCL 2.5 MG/ML IV SOLN
INTRAVENOUS | Status: AC
  Filled 2013-03-11: qty 2

## 2013-03-11 MED ORDER — ASPIRIN 81 MG PO CHEW: 81.0000 mg | CHEWABLE_TABLET | ORAL | Status: DC

## 2013-03-11 MED ORDER — FENTANYL CITRATE 0.05 MG/ML IJ SOLN
INTRAMUSCULAR | Status: AC
  Filled 2013-03-11: qty 2

## 2013-03-11 MED ORDER — ACETAMINOPHEN 325 MG PO TABS
650.0000 mg | ORAL_TABLET | ORAL | Status: DC | PRN
Start: 2013-03-11 — End: 2013-03-11

## 2013-03-11 MED ORDER — SODIUM CHLORIDE 0.9 % IJ SOLN: 3.0000 mL | INTRAMUSCULAR | Status: DC | PRN

## 2013-03-11 MED ORDER — DIAZEPAM 5 MG PO TABS
5.0000 mg | ORAL_TABLET | ORAL | Status: AC
  Administered 2013-03-11: 5 mg via ORAL
  Filled 2013-03-11: qty 1

## 2013-03-11 NOTE — Progress Notes (Signed)
2 cc air remaining in TR Band; rebleed, 8 cc air back in TR Band

## 2013-03-11 NOTE — CV Procedure (Signed)
    Cardiac Catheterization Procedure Note  Name: [name] MRN: [id number] DOB: 12/13/1926  Procedure: Left Heart Cath, Selective Coronary Angiography, SVG angio, LIMA angiography  Indication: Chest pain, abnormal stress test   Procedural Details: The left wrist was prepped, draped, and anesthetized with 1% lidocaine. Using the modified Seldinger technique, a 5 French sheath was introduced into the left radial artery. 3 mg of verapamil was administered through the sheath, weight-based unfractionated heparin was administered intravenously. Standard Judkins catheters were used for selective coronary angiography.  Left ventriculography was deferred because of chronic kidney disease. And LCB catheter was used to image the vein graft to obtuse marginal. The LIMA catheter was used to image the LIMA. Catheter exchanges were performed over an exchange length guidewire. There were no immediate procedural complications. A TR band was used for radial hemostasis at the completion of the procedure.  The patient was transferred to the post catheterization recovery area for further monitoring.  Procedural Findings: Hemodynamics: AO 160/87 LV 160/20  Coronary angiography: Coronary dominance: right  Left mainstem: The left main arises from the left coronary cusp. There is no significant disease noted. The vessel trifurcates into the LAD, intermediate branch, and left circumflex.  Left anterior descending (LAD): The proximal LAD is patent. The first diagonal is patent. The mid LAD has diffuse 50-70% stenosis. The distal vessel fills competitively from the mammary artery.  Left circumflex (LCx): The left circumflex system is patent. There is an intermediate branch with mild 30-40% stenosis. The AV circumflex has 50-60% proximal stenosis. The obtuse marginal branches are patent without significant disease.  Right coronary artery (RCA): The vessel is moderately calcified. The mid vessel is totally  occluded. There is a proximal stent that is widely patent. The vessel is occluded after the stent. The PDA fills from left to right collaterals.  Left ventriculography: Deferred  LIMA to LAD: Widely patent with no obstructive disease. There is competitive native vessel and graft flow filling the distal LAD.  Saphenous vein graft to PDA: Total occlusion of the proximal aortic anastomosis  Saphenous vein graft obtuse marginal: Total occlusion of the proximal aortic anastomosis  Final Conclusions:   1. 3 vessel coronary artery disease with total occlusion of the RCA, moderate stenosis of the LAD, and moderate stenosis of the native left circumflex  2. Status post aortocoronary bypass surgery with continued patency of the LIMA to LAD, total occlusion of the vein graft to PDA, and total occlusion of the vein graft obtuse marginal.  Recommendations: The patient has moderate nonobstructive disease of the left circumflex. His LAD is grafted with a widely patent LIMA. The circumflex is now chronically occluded with left to right collaterals. With primarily atypical chest pain symptoms, I would favor ongoing medical therapy.  [name] 03/11/2013, 9:31 AM

## 2013-03-11 NOTE — Progress Notes (Addendum)
Unable to assess puncture site due to bloody drainage around site and under TR Band; band removed, slight bleeding pressure held x 10 minutes; tegaderm applied; Dr Burt Knack informed

## 2013-03-11 NOTE — Discharge Instructions (Signed)

## 2013-03-11 NOTE — Interval H&P Note (Signed)
History and Physical Interval Note:  03/11/2013 9:27 AM  [name]  has presented today for surgery, with the diagnosis of CAD/ abnormal myoview  The various methods of treatment have been discussed with the patient and family. After consideration of risks, benefits and other options for treatment, the patient has consented to  Procedure(s): LEFT HEART CATHETERIZATION WITH CORONARY/GRAFT ANGIOGRAM (N/A) as a surgical intervention .  The patient's history has been reviewed, patient examined, no change in status, stable for surgery.  I have reviewed the patient's chart and labs.  Questions were answered to the patient's satisfaction.    Cath Lab Visit (complete for each Cath Lab visit)  Clinical Evaluation Leading to the Procedure:   ACS: no  Non-ACS:    Anginal Classification: CCS II  Anti-ischemic medical therapy: Minimal Therapy (1 class of medications)  Non-Invasive Test Results: Intermediate-risk stress test findings: cardiac mortality 1-3%/year  Prior CABG: Previous CABG  [name]

## 2013-03-11 NOTE — Progress Notes (Signed)
D/C home with wife; right radial dressing removed, site assessed, remains unremarkable without bleed/hematoma; area redressed; no c/o; Dr. Burt Knack in earlier

## 2013-03-11 NOTE — H&P (Signed)
The patient presents today for cardiac catheterization. He was last seen in the office January 29. At that time he complained of chest discomfort. The patient is status post CABG. His stress Myoview was intermediate risk and demonstrated a medium sized defect of moderate severity and partial reversibility in the basal and mid inferior and inferolateral segments. His LVEF was 52%. Considering his ongoing chest pain symptoms and intermediate risk nuclear scan, he was referred for cardiac catheterization.  Since his last office visit, there has been no change in symptoms. Please see that office note which is pasted below:  02/05/2013 HPI: 80 year old gentleman presenting for followup evaluation. The patient is followed for coronary artery disease. He underwent multivessel CABG in 2013 after presenting with non-ST elevation MI. He had previously undergone coronary stenting dating back to 2001. He was found to have severe three-vessel obstructive disease. Left ventricular function was normal. He was bypassed with the LIMA to LAD, vein graft obtuse marginal, and vein graft to PDA. He also required permanent pacing in 2014 after presenting with second degree type II AV block.  The patient was just evaluated in the emergency department on January 23. He had atypical chest pain it was felt to be musculoskeletal. Recommendations were made for an outpatient stress nuclear study. He presents today for followup. Labs reviewed demonstrated a troponin of 0.01 on serial blood draws. Creatinine was 2.0 which is been stable for him. EKG showed sinus rhythm with PACs, no significant ST or T-wave changes. Last lipids 11/27/2012 showed cholesterol 114, triglycerides 71, HDL 34, and LDL 66.  Drives fleeting sharp pains that feel like "a pencil sticking in his chest." He is concerned because he had similar symptoms prior to his bypass surgery. At the time of bypass, he also had fairly marked exertional dyspnea. This symptom has  resolved since his surgery. He denies edema, orthopnea, PND, or palpitations. He is not having exertional chest pain or pressure.   Outpatient Encounter Prescriptions as of 02/05/2013   Medication  Sig   .  aspirin 325 MG buffered tablet  Take 325 mg by mouth daily.   .  cyanocobalamin 1000 MCG tablet  Take 1,000 mcg by mouth daily.   .  ergocalciferol (VITAMIN D2) 50000 UNITS capsule  Take 50,000 Units by mouth every 30 (thirty) days. 15 th of each month   .  folic acid (FOLVITE) 1 MG tablet  Take 1 tablet (1 mg total) by mouth daily.   .  metoprolol succinate (TOPROL-XL) 25 MG 24 hr tablet  Take 2 times daily by mouth.   .  nitroGLYCERIN (NITROSTAT) 0.4 MG SL tablet  Place 1 tablet (0.4 mg total) under the tongue every 5 (five) minutes as needed for chest pain.   .  potassium chloride (K-DUR) 10 MEQ tablet  Take 1 tablet (10 mEq total) by mouth daily.   .  simvastatin (ZOCOR) 20 MG tablet  Take 1 tablet (20 mg total) by mouth at bedtime.    Allergies   Allergen  Reactions   .  Amoxicillin  Nausea Only   .  Aspirin      REACTION: upset stomach; tolerates coated aspirin   .  Atarax [Hydroxyzine Hcl]  Other (See Comments)     REACTION: unknown   .  Ciprofloxacin  Nausea Only   .  Codeine      REACTION: Stomach upset   .  Hydrocodone  Nausea Only   .  Macrobid [Nitrofurantoin Macrocrystal]  Nausea Only   .  Niacin  Other (See Comments)     REACTION: unknown   .  Niacin-Lovastatin Er      Unsure of reaction. Taking simvastatin at home without problems   .  Nitrofurantoin  Other (See Comments)     REACTION: upset stomach   .  Trimox [Amoxicillin Trihydrate]  Other (See Comments)     REACTION: unknown    Past Medical History   Diagnosis  Date   .  Hypertension      well-controlled.   [name]  CAD (coronary artery disease)      a. S/P stenting to mid RCA, prox PDA 06/1999. b. NSTEMI/CABG x 3 in 10/2011 with LIMA to LAD, SVG to PDA, and SVG to OM1.   .  CKD (chronic kidney disease), stage  III      a. stable with a creatinine around 1.9-2.0 followed by nephrology.   .  Dyslipidemia    .  Neck injury      a. C3-C4 and C4-C5 foraminal narrowing, severe   .  Renal artery stenosis      a. 50% by cath 2001   .  Pulmonary nodule      a. felt to be noncancerous. Status post followup CT scan 4 mm and stable.   .  Chronic UTI      a. Followed by Dr. Risa Grill - colonized/asymptomatic - not on abx   .  Atrial fibrillation      a. Post-op from CABG, on amiodarone temporarily, d/c'd 12/2011   .  Acute superficial venous thrombosis of lower extremity      a. RLE after CABG, neg dopp for DVT.   [name]  Prostate cancer      a. 2001 s/p TURP.   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 Coronary artery disease status post CABG. The patient is having atypical chest pain. A nuclear stress test has been arranged February 9. Will determine followup after his stress test is completed. I think since his symptoms are similar to those prior to bypass, it is reasonable to proceed with stress testing to rule out early graft failure. I reassured him that he could start back on his exercise program as long as he is not having exertional symptoms.  2. Hypertension. Blood pressure is well controlled on metoprolol succinate.  3. Hyperlipidemia. Lipids reviewed as above. Continue simvastatin 20 mg.  If his nuclear scan is low risk, I will plan on seeing him back in one year. He has an appointment scheduled in March and  we will leave this on the books for now until we see the results of the stress test.  [name]  02/05/2013  9:45 AM  Myocardial perfusion scan 02/17/2013: Impression  Exercise Capacity: Lexiscan with low level exercise.  BP Response: Normal blood pressure response.  Clinical Symptoms: No chest pain.  ECG Impression: No significant ST segment change  suggestive of ischemia.  Comparison with Prior Nuclear Study: Since prior myoview of 08/10/08, there is new partially reversible ischemia of the inferolateral wall, and the EF has decreased from 68% to 52%.  Overall Impression: Intermediate risk stress nuclear study. There is a medium size defect of moderate severity and partial reversibility involving the basal and mid-inferior and basal and mid-inferolateral segments. This is new since 2010.  LV Ejection Fraction: 52%. LV Wall Motion: There is mild inferior hypokinesis.  Darlin Coco  As above, plan to proceed with cardiac catheterization via a left radial approach. I have reviewed the risks, indications, and alternatives to cardiac catheterization with possible PCI. The patient does have chronic kidney disease and he is receiving intravenous hydration with normal saline. Will limit dye is much as possible. He understands that he may require a staged procedure because of his renal dysfunction. All questions were answered.  [name] 03/11/2013 8:05 AM

## 2013-03-30 ENCOUNTER — Encounter: Payer: Self-pay | Admitting: Internal Medicine

## 2013-03-30 ENCOUNTER — Ambulatory Visit (INDEPENDENT_AMBULATORY_CARE_PROVIDER_SITE_OTHER): Payer: Medicare Other | Admitting: Internal Medicine

## 2013-03-30 VITALS — BP 128/74 | HR 67 | Ht 72.0 in | Wt 181.0 lb

## 2013-03-30 DIAGNOSIS — I441 Atrioventricular block, second degree: Secondary | ICD-10-CM

## 2013-03-30 DIAGNOSIS — I10 Essential (primary) hypertension: Secondary | ICD-10-CM

## 2013-03-30 DIAGNOSIS — I2510 Atherosclerotic heart disease of native coronary artery without angina pectoris: Secondary | ICD-10-CM

## 2013-03-30 LAB — MDC_IDC_ENUM_SESS_TYPE_INCLINIC
Battery Impedance: 100 Ohm
Battery Remaining Longevity: 169 mo
Battery Voltage: 2.8 V
Brady Statistic AS VS Percent: 70 %
Lead Channel Impedance Value: 460 Ohm
Lead Channel Impedance Value: 648 Ohm
Lead Channel Pacing Threshold Pulse Width: 0.4 ms
Lead Channel Setting Pacing Amplitude: 2 V
Lead Channel Setting Pacing Amplitude: 2.5 V
Lead Channel Setting Sensing Sensitivity: 5.6 mV
MDC IDC MSMT LEADCHNL RA PACING THRESHOLD AMPLITUDE: 0.5 V
MDC IDC MSMT LEADCHNL RA PACING THRESHOLD PULSEWIDTH: 0.4 ms
MDC IDC MSMT LEADCHNL RA SENSING INTR AMPL: 4 mV
MDC IDC MSMT LEADCHNL RV PACING THRESHOLD AMPLITUDE: 0.75 V
MDC IDC MSMT LEADCHNL RV SENSING INTR AMPL: 22.4 mV
MDC IDC SESS DTM: 20150323195122
MDC IDC SET LEADCHNL RV PACING PULSEWIDTH: 0.4 ms
MDC IDC STAT BRADY AP VP PERCENT: 0 %
MDC IDC STAT BRADY AP VS PERCENT: 30 %
MDC IDC STAT BRADY AS VP PERCENT: 0 %

## 2013-03-30 NOTE — Progress Notes (Signed)
PCP: Adella Hare, MD Primary Cardiologist:  Dr Lionel December is a 80 y.o. male who presents today for routine electrophysiology followup.  Since his last visit, the patient reports doing very well.  He recently underwent cath by Dr Burt Knack for evaluation of chest pain.  Medical therapy was advised.  Today, he denies symptoms of palpitations, further chest pain, shortness of breath,  lower extremity edema,  or syncope.  The patient is otherwise without complaint today.   Past Medical History  Diagnosis Date  . Hypertension     well-controlled.  [name] CAD (coronary artery disease)     a. S/P stenting to mid RCA, prox PDA 06/1999. b. NSTEMI/CABG x 3 in 10/2011 with LIMA to LAD, SVG to PDA, and SVG to OM1.   . CKD (chronic kidney disease), stage III     a. stable with a creatinine around 1.9-2.0 followed by nephrology.  . Dyslipidemia   . Neck injury     a. C3-C4 and C4-C5 foraminal narrowing, severe  . Renal artery stenosis     a. 50% by cath 2001  . Pulmonary nodule     a. felt to be noncancerous.  Status post followup CT scan 4 mm and stable.  . Chronic UTI     a. Followed by Dr. Risa Grill - colonized/asymptomatic - not on abx  . Atrial fibrillation     a. Post-op from CABG, on amiodarone temporarily, d/c'd 12/2011  . Acute superficial venous thrombosis of lower extremity     a. RLE after CABG, neg dopp for DVT.  [name] Prostate cancer     a. 2001 s/p TURP.  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 S/p ptca    . Cardia catherization  07-07-99  . Peripheral vascular catherization  11-24-03  . Renal circulation  10-01-03  . Stress cardiolite  05-04-05    spring 09-negative except for apical thinning, EF 68%  . Edg  07-17-1994  . Flexible sigmoidoscopy  11-03-1997  . Prostatectomy    . Lumbar spinal disk and neck fusion surgery    . Stents      X 2  . Coronary artery bypass graft   10/19/2011    Procedure: CORONARY ARTERY BYPASS GRAFTING (CABG);  Surgeon: Gaye Pollack, MD;  Location: [facility];  Service: Open Heart Surgery;  Laterality: N/A;  times three using Left Internal Mammary Artery and Right Greater Saphenouse Vein Graft harvested Endoscopically  . Cardiac surgery  10/18/12    open heart surgery  . Colonoscopy  04/12/07  . Pacemaker insertion  03/28/12    PPM implanted for mobitz II AV block    Current Outpatient Prescriptions  Medication Sig Dispense Refill  . aspirin 325 MG buffered tablet Take 325 mg by mouth daily.        . carboxymethylcellulose (REFRESH PLUS) 0.5 % SOLN Place 1 drop into both eyes daily as needed (dry eyes).       . ergocalciferol (VITAMIN D2) 50000 UNITS capsule Take 50,000 Units by mouth every 30 (thirty) days. 15 th of each month      . fluticasone (FLONASE) 50 MCG/ACT nasal spray Place 1 spray into both nostrils daily as needed for allergies or rhinitis.      . folic acid (FOLVITE) 1 MG tablet Take 1 tablet (1 mg total) by mouth daily.  90 tablet  3  .  metoprolol succinate (TOPROL-XL) 25 MG 24 hr tablet Take 25 mg by mouth 2 (two) times daily.       . nitroGLYCERIN (NITROSTAT) 0.4 MG SL tablet Place 1 tablet (0.4 mg total) under the tongue every 5 (five) minutes as needed for chest pain.  90 tablet  3  . potassium chloride (K-DUR) 10 MEQ tablet Take 1 tablet (10 mEq total) by mouth daily.  90 tablet  3  . simvastatin (ZOCOR) 20 MG tablet Take 1 tablet (20 mg total) by mouth at bedtime.  90 tablet  3  . vitamin B-12 (CYANOCOBALAMIN) 1000 MCG tablet Take 1,000 mcg by mouth daily.      . vitamin C (ASCORBIC ACID) 500 MG tablet Take 1,500 mg by mouth daily.       No current facility-administered medications for this visit.    Physical Exam: Filed Vitals:   03/30/13 1511  BP: 128/74  Pulse: 67  Height: 6' (1.829 m)  Weight: 181 lb (82.101 kg)    GEN- The patient is well appearing, alert and oriented x 3 today.   Head- normocephalic,  atraumatic Eyes-  Sclera clear, conjunctiva pink Ears- hearing intact Oropharynx- clear Lungs- Clear to ausculation bilaterally, normal work of breathing Chest- pacemaker pocket is well healed Heart- Regular rate and rhythm, no murmurs, rubs or gallops, PMI not laterally displaced GI- soft, NT, ND, + BS Extremities- no clubbing, cyanosis, or edema  Pacemaker interrogation- reviewed in detail today,  See PACEART report  Assessment and Plan:  1. Mobitz II AV block Normal pacemaker function See Pace Art report  2. CAD Stable No change required today  3. HTN Stable No change required today  [facility] Return in 1 year

## 2013-03-30 NOTE — Patient Instructions (Signed)
Your physician wants you to follow-up in: 12 months with Dr Vallery Ridge will receive a reminder letter in the mail two months in advance. If you don't receive a letter, please call our office to schedule the follow-up appointment.    Remote monitoring is used to monitor your Pacemaker or ICD from home. This monitoring reduces the number of office visits required to check your device to one time per year. It allows [place] to keep an eye on the functioning of your device to ensure it is working properly. You are scheduled for a device check from home on 06/30/13. You may send your transmission at any time that day. If you have a wireless device, the transmission will be sent automatically. After your physician reviews your transmission, you will receive a postcard with your next transmission date.

## 2013-04-06 ENCOUNTER — Encounter: Payer: Self-pay | Admitting: Internal Medicine

## 2013-04-07 ENCOUNTER — Encounter: Payer: Self-pay | Admitting: Internal Medicine

## 2013-04-20 ENCOUNTER — Encounter: Payer: Self-pay | Admitting: Cardiovascular Disease

## 2013-04-20 ENCOUNTER — Ambulatory Visit (INDEPENDENT_AMBULATORY_CARE_PROVIDER_SITE_OTHER): Payer: Medicare Other | Admitting: Cardiovascular Disease

## 2013-04-20 VITALS — BP 122/60 | HR 61 | Ht 72.0 in | Wt 179.0 lb

## 2013-04-20 DIAGNOSIS — I2510 Atherosclerotic heart disease of native coronary artery without angina pectoris: Secondary | ICD-10-CM

## 2013-04-20 DIAGNOSIS — E785 Hyperlipidemia, unspecified: Secondary | ICD-10-CM

## 2013-04-20 DIAGNOSIS — I10 Essential (primary) hypertension: Secondary | ICD-10-CM

## 2013-04-20 NOTE — Patient Instructions (Signed)
Your physician wants you to follow-up in: 6 MONTHS with Dr Burt Knack.  You will receive a reminder letter in the mail two months in advance. If you don't receive a letter, please call our office to schedule the follow-up appointment.  Your physician recommends that you return for a FASTING LIPID and LIVER profile in 6 MONTHS (nothing to eat or drink after midnight)--lab opens at 7:30 am  Your physician recommends that you continue on your current medications as directed. Please refer to the Current Medication list given to you today.

## 2013-04-20 NOTE — Progress Notes (Signed)
HPI:   80 year old gentleman presenting for followup evaluation. The patient is followed for coronary artery disease. He underwent multivessel CABG in 2013 after presenting with non-ST elevation MI. He had previously undergone coronary stenting dating back to 2001. He was found to have severe three-vessel obstructive disease. Left ventricular function was normal. He was bypassed with the LIMA to LAD, vein graft obtuse marginal, and vein graft to PDA. He also required permanent pacing in 2014 after presenting with second degree type II AV block.  The patient was seen back in February with complaints of chest pain and shortness of breath. A nuclear scan was done and demonstrated a partially reversible been sized defect involving the mid inferior and inferolateral walls. He subsequently underwent cardiac catheterization with results below:  Cardiac Cath 03/11/2013: Procedural Findings:  Hemodynamics:  AO 160/87  LV 160/20  Coronary angiography:  Coronary dominance: right  Left mainstem: The left main arises from the left coronary cusp. There is no significant disease noted. The vessel trifurcates into the LAD, intermediate branch, and left circumflex.  Left anterior descending (LAD): The proximal LAD is patent. The first diagonal is patent. The mid LAD has diffuse 50-70% stenosis. The distal vessel fills competitively from the mammary artery.  Left circumflex (LCx): The left circumflex system is patent. There is an intermediate branch with mild 30-40% stenosis. The AV circumflex has 50-60% proximal stenosis. The obtuse marginal branches are patent without significant disease.  Right coronary artery (RCA): The vessel is moderately calcified. The mid vessel is totally occluded. There is a proximal stent that is widely patent. The vessel is occluded after the stent. The PDA fills from left to right collaterals.  Left ventriculography: Deferred  LIMA to LAD: Widely patent with no obstructive disease.  There is competitive native vessel and graft flow filling the distal LAD.  Saphenous vein graft to PDA: Total occlusion of the proximal aortic anastomosis  Saphenous vein graft obtuse marginal: Total occlusion of the proximal aortic anastomosis  Final Conclusions:  1. 3 vessel coronary artery disease with total occlusion of the RCA, moderate stenosis of the LAD, and moderate stenosis of the native left circumflex  2. Status post aortocoronary bypass surgery with continued patency of the LIMA to LAD, total occlusion of the vein graft to PDA, and total occlusion of the vein graft obtuse marginal.  Recommendations: The patient has moderate nonobstructive disease of the left circumflex. His LAD is grafted with a widely patent LIMA. The circumflex is now chronically occluded with left to right collaterals. With primarily atypical chest pain symptoms, I would favor ongoing medical therapy.  [name]  03/11/2013, 9:31 AM   His last lipid panel from 11/27/2012 shows the following results: Lipid Panel     Component Value Date/Time   CHOL 114 11/27/2012 0959   TRIG 71.0 11/27/2012 0959   HDL 34.20* 11/27/2012 0959   CHOLHDL 3 11/27/2012 0959   VLDL 14.2 11/27/2012 0959   LDLCALC 66 11/27/2012 0959   From a symptomatic perspective, the patient is stable. He has no chest pain other than a few fleeting episodes. There is no exertional chest pain or pressure. He exercises 3 days per week at the gym. He uses a treadmill and does some weight lifting. Reports no change in exercise capacity. He has upcoming cataract surgery. Other complaints include insomnia and fatigue. No edema, orthopnea, PND, lightheadedness, palpitations, or syncope. He has chronic exertional dyspnea with no recent change.   Outpatient Encounter Prescriptions as of 04/20/2013  Medication Sig  . aspirin 325 MG buffered tablet Take 325 mg by mouth daily.    . carboxymethylcellulose (REFRESH PLUS) 0.5 % SOLN Place 1 drop into both eyes  daily as needed (dry eyes).   . ergocalciferol (VITAMIN D2) 50000 UNITS capsule Take 50,000 Units by mouth every 30 (thirty) days. 15 th of each month  . fluticasone (FLONASE) 50 MCG/ACT nasal spray Place 1 spray into both nostrils daily as needed for allergies or rhinitis.  . folic acid (FOLVITE) 1 MG tablet Take 1 tablet (1 mg total) by mouth daily.  . metoprolol succinate (TOPROL-XL) 25 MG 24 hr tablet Take 25 mg by mouth 2 (two) times daily.   . nitroGLYCERIN (NITROSTAT) 0.4 MG SL tablet Place 1 tablet (0.4 mg total) under the tongue every 5 (five) minutes as needed for chest pain.  . potassium chloride (K-DUR) 10 MEQ tablet Take 1 tablet (10 mEq total) by mouth daily.  . simvastatin (ZOCOR) 20 MG tablet Take 1 tablet (20 mg total) by mouth at bedtime.  . vitamin B-12 (CYANOCOBALAMIN) 1000 MCG tablet Take 1,000 mcg by mouth daily.  . vitamin C (ASCORBIC ACID) 500 MG tablet Take 1,500 mg by mouth daily.    Allergies  Allergen Reactions  . Amoxicillin Nausea Only  . Aspirin Other (See Comments)    REACTION: upset stomach; tolerates coated aspirin  . Atarax [Hydroxyzine Hcl] Other (See Comments)    REACTION: unknown  . Ciprofloxacin Nausea Only  . Codeine     REACTION: Stomach upset  . Hydrocodone Nausea Only  . Hydroxyzine Nausea And Vomiting  . Macrobid [Nitrofurantoin Macrocrystal] Nausea Only  . Niacin Other (See Comments)    REACTION: unknown  . Niacin-Lovastatin Er     Unsure of reaction. Taking simvastatin at home without problems  . Nitrofurantoin Other (See Comments)    REACTION: upset stomach  . Trimox [Amoxicillin Trihydrate] Other (See Comments)    REACTION: unknown    Past Medical History  Diagnosis Date  . Hypertension     well-controlled.  [name] CAD (coronary artery disease)     a. S/P stenting to mid RCA, prox PDA 06/1999. b. NSTEMI/CABG x 3 in 10/2011 with LIMA to LAD, SVG to PDA, and SVG to OM1.   . CKD (chronic kidney disease), stage III     a. stable with a  creatinine around 1.9-2.0 followed by nephrology.  . Dyslipidemia   . Neck injury     a. C3-C4 and C4-C5 foraminal narrowing, severe  . Renal artery stenosis     a. 50% by cath 2001  . Pulmonary nodule     a. felt to be noncancerous.  Status post followup CT scan 4 mm and stable.  . Chronic UTI     a. Followed by Dr. Risa Grill - colonized/asymptomatic - not on abx  . Atrial fibrillation     a. Post-op from CABG, on amiodarone temporarily, d/c'd 12/2011  . Acute superficial venous thrombosis of lower extremity     a. RLE after CABG, neg dopp for DVT.  [name] Prostate cancer     a. 2001 s/p TURP.  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  ASSESSMENT AND PLAN: 1. Coronary artery disease status post CABG with multivessel native disease and saphenous vein graft failure. He has continued patency of his LIMA to LAD, collaterals to the right coronary distribution, and moderate left circumflex stenosis. Overall he is stable and will continue his current medical program. We had extensive discussion about his prognosis from a cardiac perspective. He has normal left ventricular function. He is not limited by symptoms of angina. I am hopeful that he will have stability now and would not expect for him to have problems related to his LIMA graft. He will need to continue on aggressive medical program.  2. Hyperlipidemia. Last lipids as above. We'll continue simvastatin and repeat lipids next year.  3.  Hypertension. Blood pressure well controlled on metoprolol succinate.  4. Preoperative cardiac evaluation, the patient is at low risk of problems related to cataract surgery and he can proceed without further testing.  [name] 04/20/2013 11:45 AM

## 2013-06-24 ENCOUNTER — Ambulatory Visit (INDEPENDENT_AMBULATORY_CARE_PROVIDER_SITE_OTHER): Payer: Medicare Other | Admitting: Internal Medicine

## 2013-06-24 ENCOUNTER — Encounter: Payer: Self-pay | Admitting: Internal Medicine

## 2013-06-24 VITALS — BP 138/76 | HR 76 | Temp 99.8°F | Resp 13 | Wt 170.8 lb

## 2013-06-24 DIAGNOSIS — J329 Chronic sinusitis, unspecified: Secondary | ICD-10-CM

## 2013-06-24 DIAGNOSIS — I2510 Atherosclerotic heart disease of native coronary artery without angina pectoris: Secondary | ICD-10-CM

## 2013-06-24 DIAGNOSIS — J209 Acute bronchitis, unspecified: Secondary | ICD-10-CM

## 2013-06-24 MED ORDER — PREDNISONE 20 MG PO TABS: 20.0000 mg | ORAL_TABLET | Freq: Two times a day (BID) | ORAL | Status: DC

## 2013-06-24 MED ORDER — BENZONATATE 200 MG PO CAPS: 200.0000 mg | ORAL_CAPSULE | Freq: Three times a day (TID) | ORAL | Status: DC | PRN

## 2013-06-24 MED ORDER — SULFAMETHOXAZOLE-TMP DS 800-160 MG PO TABS: 1.0000 | ORAL_TABLET | Freq: Two times a day (BID) | ORAL | Status: DC

## 2013-06-24 NOTE — Progress Notes (Signed)
Pre visit review using our clinic review tool, if applicable. No additional management support is needed unless otherwise documented below in the visit note. 

## 2013-06-24 NOTE — Patient Instructions (Addendum)
Plain Mucinex (NOT D) for thick secretions ;force NON dairy fluids .   Nasal cleansing in the shower as discussed with lather of mild shampoo.After 10 seconds wash off lather while  exhaling through nostrils. Make sure that all residual soap is removed to prevent irritation.  Flonase OR Nasacort AQ 1 spray in each nostril twice a day as needed. Use the "crossover" technique into opposite nostril spraying toward opposite ear @ 45 degree angle, not straight up into nostril.  Use a Neti pot daily only  as needed for significant sinus congestion; going from open side to congested side . Plain Allegra (NOT D )  160 daily , Loratidine 10 mg , OR Zyrtec 10 mg @ bedtime  as needed for itchy eyes & sneezing.  Fill the  prescription for Prednisone if there is not dramatic improvement in the next 48-72 hours.

## 2013-06-24 NOTE — Progress Notes (Signed)
   Subjective:    Patient ID: [name], male    DOB: 1926-11-17, 80 y.o.   MRN: [id number]  HPI  He has had a cough for 6 - 7 days which has been progressive. He is unsure of any exposures to ill individuals. The cough is paroxysmal throughout the day but worse at night. It is productive of gray/green thick sputum. Honey and Robitussin have been of minimal benefit. As of 6/15 he's had some discolored nasal secretions as well with associated sneezing. He is concerned as he has cataract surgery schedule 07/01/13. He has never smoked. He has no history of asthma.     Review of Systems  He specifically denies significant itchy, watery eyes.  He has no fever, chills, or sweats. There is no associated pleuritic chest pain. Paragraph he has no significant frontal headache, facial pain, or dental pain. The cough is not associated with wheezing or shortness of breath.       Objective:   Physical Exam   He appears much younger than stated age. There is wax in the right otic canal. The septum is dislocated to the left. Nares are boggy & moist.. He is hoarse and exhibits hypo-nasal speech pattern. The remainder the exam was unremarkable.  General appearance:Thin but in good health ;well nourished; no acute distress or increased work of breathing is present.  No  lymphadenopathy about the head, neck, or axilla noted.   Eyes: No conjunctival inflammation or lid edema is present. There is no scleral icterus.  Ears:  External ear exam shows no significant lesions or deformities.  L  tympanic membrane intact  without bulging, retraction, inflammation or discharge.  Nose:  External nasal examination shows no deformity or inflammation.    Oral exam: Dental hygiene is good; lips and gums are healthy appearing.There is no oropharyngeal erythema or exudate noted.   Neck:  No deformities, thyromegaly, masses, or tenderness noted.   Supple with full range of motion without pain.   Heart:   Normal rate and regular rhythm. S1 and S2 normal without gallop, murmur, click, rub or other extra sounds.   Lungs:Chest clear to auscultation; no wheezes, rhonchi,rales ,or rubs present.No increased work of breathing.    Extremities:  No cyanosis, edema, or clubbing  noted    Skin: Warm & dry          Assessment & Plan:  #1 acute bronchitis w/o bronchospasm #2 URI, acute Plan: See orders and recommendations

## 2013-06-25 ENCOUNTER — Telehealth: Payer: Self-pay | Admitting: Internal Medicine

## 2013-06-25 NOTE — Telephone Encounter (Signed)
yes

## 2013-06-25 NOTE — Telephone Encounter (Signed)
Patient left message wanting to know that if his is no better and has to have the Prednisone filled, does he continue other meds too?

## 2013-06-25 NOTE — Telephone Encounter (Signed)
Patient has been advised of MD response

## 2013-06-25 NOTE — Telephone Encounter (Signed)
Dr Ronnald Ramp, should patient have to fill Prednisone should he still continue Bactrim and Tessalon?

## 2013-06-29 ENCOUNTER — Telehealth: Payer: Self-pay | Admitting: *Deleted

## 2013-06-29 NOTE — Telephone Encounter (Signed)
Prednisone can cause acne type rash . Stop it & monitor rash.

## 2013-06-29 NOTE — Telephone Encounter (Signed)
Phone call to patient. He states he has not started on Prednisone. He is taking Muncinex, Bactrim DS, and Benzonatate.

## 2013-06-29 NOTE — Telephone Encounter (Signed)
Left msg on triage stating saw Dr. Linna Darner he had rx something for the congestion sxs. He has started breaking out on his back. He has stop taking the prednisone & tessalon pearle wanting to know about the antibiotic should he stop & have md to rx something else...[name]

## 2013-06-29 NOTE — Telephone Encounter (Signed)
Stop sulfa ; no new antibiotic until rash resolves

## 2013-06-30 ENCOUNTER — Ambulatory Visit (INDEPENDENT_AMBULATORY_CARE_PROVIDER_SITE_OTHER): Payer: Medicare Other | Admitting: *Deleted

## 2013-06-30 ENCOUNTER — Encounter: Payer: Self-pay | Admitting: Internal Medicine

## 2013-06-30 DIAGNOSIS — R001 Bradycardia, unspecified: Secondary | ICD-10-CM

## 2013-06-30 DIAGNOSIS — I498 Other specified cardiac arrhythmias: Secondary | ICD-10-CM

## 2013-06-30 NOTE — Progress Notes (Signed)
Remote pacemaker transmission.   

## 2013-06-30 NOTE — Telephone Encounter (Signed)
Notified pt with md response.../lmb 

## 2013-07-06 LAB — MDC_IDC_ENUM_SESS_TYPE_REMOTE
Battery Impedance: 112 Ohm
Battery Voltage: 2.79 V
Brady Statistic AP VP Percent: 0 %
Brady Statistic AP VS Percent: 32 %
Brady Statistic AS VP Percent: 0 %
Brady Statistic AS VS Percent: 68 %
Date Time Interrogation Session: 20150623130710
Lead Channel Impedance Value: 473 Ohm
Lead Channel Impedance Value: 616 Ohm
Lead Channel Pacing Threshold Amplitude: 0.625 V
Lead Channel Pacing Threshold Pulse Width: 0.4 ms
Lead Channel Pacing Threshold Pulse Width: 0.4 ms
Lead Channel Setting Pacing Amplitude: 2 V
Lead Channel Setting Sensing Sensitivity: 5.6 mV
MDC IDC MSMT BATTERY REMAINING LONGEVITY: 153 mo
MDC IDC MSMT LEADCHNL RA SENSING INTR AMPL: 2.8 mV
MDC IDC MSMT LEADCHNL RV PACING THRESHOLD AMPLITUDE: 0.625 V
MDC IDC MSMT LEADCHNL RV SENSING INTR AMPL: 22.4 mV
MDC IDC SET LEADCHNL RV PACING AMPLITUDE: 2.5 V
MDC IDC SET LEADCHNL RV PACING PULSEWIDTH: 0.4 ms

## 2013-07-08 ENCOUNTER — Encounter: Payer: Self-pay | Admitting: Cardiology

## 2013-07-14 ENCOUNTER — Encounter: Payer: Self-pay | Admitting: Internal Medicine

## 2013-07-14 ENCOUNTER — Ambulatory Visit (INDEPENDENT_AMBULATORY_CARE_PROVIDER_SITE_OTHER)
Admission: RE | Admit: 2013-07-14 | Discharge: 2013-07-14 | Disposition: A | Discharge status: Home / Self Care | Payer: Medicare Other | Source: Ambulatory Visit | Attending: Internal Medicine | Admitting: Internal Medicine

## 2013-07-14 ENCOUNTER — Ambulatory Visit (INDEPENDENT_AMBULATORY_CARE_PROVIDER_SITE_OTHER): Payer: Medicare Other | Admitting: Internal Medicine

## 2013-07-14 VITALS — BP 122/68 | HR 62 | Temp 98.8°F | Wt 174.0 lb

## 2013-07-14 DIAGNOSIS — I2510 Atherosclerotic heart disease of native coronary artery without angina pectoris: Secondary | ICD-10-CM

## 2013-07-14 DIAGNOSIS — J209 Acute bronchitis, unspecified: Secondary | ICD-10-CM

## 2013-07-14 DIAGNOSIS — I10 Essential (primary) hypertension: Secondary | ICD-10-CM

## 2013-07-14 MED ORDER — HYDROCODONE-HOMATROPINE 5-1.5 MG/5ML PO SYRP: 5.0000 mL | ORAL_SOLUTION | Freq: Four times a day (QID) | ORAL | Status: DC | PRN

## 2013-07-14 MED ORDER — LEVOFLOXACIN 250 MG PO TABS: 250.0000 mg | ORAL_TABLET | Freq: Every day | ORAL | Status: DC

## 2013-07-14 NOTE — Progress Notes (Signed)
Pre visit review using our clinic review tool, if applicable. No additional management support is needed unless otherwise documented below in the visit note. 

## 2013-07-14 NOTE — Patient Instructions (Signed)
Please take all new medication as prescribed - the antibiotic, and cough medicine if needed  You can also take Mucinex (or it's generic off brand) for congestion, and tylenol as needed for pain.  Please continue all other medications as before, and refills have been done if requested.  Please have the pharmacy call with any other refills you may need.  Please keep your appointments with your specialists as you may have planned  Please go to the XRAY Department in the Basement (go straight as you get off the elevator) for the x-ray testing  You will be contacted by phone if any changes need to be made immediately.  Otherwise, you will receive a letter about your results with an explanation, but please check with MyChart first.  Please remember to sign up for MyChart if you have not done so, as this will be important to you in the future with finding out test results, communicating by private email, and scheduling acute appointments online when needed.

## 2013-07-14 NOTE — Progress Notes (Signed)
Subjective:    Patient ID: [name], male    DOB: 1926-07-16, 80 y.o.   MRN: [id number]  HPI Here with acute onset mild to mod 2-3 days ST, HA, general weakness and malaise, with prod cough greenish sputum, but Pt denies chest pain, increased sob or doe, wheezing, orthopnea, PND, increased LE swelling, palpitations, dizziness or syncope.  Pt denies new neurological symptoms such as new headache, or facial or extremity weakness or numbness Past Medical History  Diagnosis Date  . Hypertension     well-controlled.  [name] CAD (coronary artery disease)     a. S/P stenting to mid RCA, prox PDA 06/1999. b. NSTEMI/CABG x 3 in 10/2011 with LIMA to LAD, SVG to PDA, and SVG to OM1.   . CKD (chronic kidney disease), stage III     a. stable with a creatinine around 1.9-2.0 followed by nephrology.  . Dyslipidemia   . Neck injury     a. C3-C4 and C4-C5 foraminal narrowing, severe  . Renal artery stenosis     a. 50% by cath 2001  . Pulmonary nodule     a. felt to be noncancerous.  Status post followup CT scan 4 mm and stable.  . Chronic UTI     a. Followed by Dr. Risa Grill - colonized/asymptomatic - not on abx  . Atrial fibrillation     a. Post-op from CABG, on amiodarone temporarily, d/c'd 12/2011  . Acute superficial venous thrombosis of lower extremity     a. RLE after CABG, neg dopp for DVT.  [name] Prostate cancer     a. 2001 s/p TURP.  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 S/p ptca    . Cardia catherization  07-07-99  . Peripheral vascular catherization  11-24-03  . Renal circulation  10-01-03  . Stress cardiolite  05-04-05    spring 09-negative except for apical thinning, EF 68%  . Edg  07-17-1994  . Flexible sigmoidoscopy  11-03-1997  . Prostatectomy    . Lumbar spinal disk and neck fusion surgery    . Stents      X 2  . Coronary artery bypass graft  10/19/2011    Procedure: CORONARY  ARTERY BYPASS GRAFTING (CABG);  Surgeon: Gaye Pollack, MD;  Location: [facility];  Service: Open Heart Surgery;  Laterality: N/A;  times three using Left Internal Mammary Artery and Right Greater Saphenouse Vein Graft harvested Endoscopically  . Cardiac surgery  10/18/12    open heart surgery  . Colonoscopy  04/12/07  . Pacemaker insertion  03/28/12    PPM implanted for mobitz II AV block    reports that he has never smoked. He has never used smokeless tobacco. He reports that he drinks alcohol. He reports that he does not use illicit drugs. family history includes Arthritis in his mother; Breast cancer in his other; Cancer in his brother; Coronary artery disease in his father; Heart disease in his brother; Nephritis in his brother; Other in his brother and mother; Prostate cancer in his brother. There is no history of Diabetes. Allergies  Allergen Reactions  . Amoxicillin Nausea Only  . Aspirin Other (See Comments)    REACTION: upset stomach; tolerates coated aspirin  . Atarax [Hydroxyzine Hcl] Other (See Comments)    REACTION: unknown  . Ciprofloxacin Nausea Only  . Codeine     REACTION: Stomach upset  . Hydrocodone  Nausea Only  . Hydroxyzine Nausea And Vomiting  . Macrobid [Nitrofurantoin Macrocrystal] Nausea Only  . Niacin Other (See Comments)    REACTION: unknown  . Niacin-Lovastatin Er     Unsure of reaction. Taking simvastatin at home without problems  . Nitrofurantoin Other (See Comments)    REACTION: upset stomach  . Trimox [Amoxicillin Trihydrate] Other (See Comments)    REACTION: unknown  . Bactrim [Sulfamethoxazole-Tmp Ds] Rash   Current Outpatient Prescriptions on File Prior to Visit  Medication Sig Dispense Refill  . aspirin 325 MG buffered tablet Take 325 mg by mouth daily.        . carboxymethylcellulose (REFRESH PLUS) 0.5 % SOLN Place 1 drop into both eyes daily as needed (dry eyes).       . ergocalciferol (VITAMIN D2) 50000 UNITS capsule Take 50,000 Units by mouth  every 30 (thirty) days. 15 th of each month      . fluticasone (FLONASE) 50 MCG/ACT nasal spray Place 1 spray into both nostrils daily as needed for allergies or rhinitis.      . folic acid (FOLVITE) 1 MG tablet Take 1 tablet (1 mg total) by mouth daily.  90 tablet  3  . metoprolol succinate (TOPROL-XL) 25 MG 24 hr tablet Take 25 mg by mouth 2 (two) times daily.       . nitroGLYCERIN (NITROSTAT) 0.4 MG SL tablet Place 1 tablet (0.4 mg total) under the tongue every 5 (five) minutes as needed for chest pain.  90 tablet  3  . potassium chloride (K-DUR) 10 MEQ tablet Take 1 tablet (10 mEq total) by mouth daily.  90 tablet  3  . simvastatin (ZOCOR) 20 MG tablet Take 1 tablet (20 mg total) by mouth at bedtime.  90 tablet  3  . vitamin B-12 (CYANOCOBALAMIN) 1000 MCG tablet Take 1,000 mcg by mouth daily.      . vitamin C (ASCORBIC ACID) 500 MG tablet Take 1,500 mg by mouth daily.       No current facility-administered medications on file prior to visit.    Review of Systems  Constitutional: Negative for unusual diaphoresis or other sweats  HENT: Negative for ringing in ear Eyes: Negative for double vision or worsening visual disturbance.  Respiratory: Negative for choking and stridor.   Gastrointestinal: Negative for vomiting or other signifcant bowel change Genitourinary: Negative for hematuria or decreased urine volume.  Musculoskeletal: Negative for other MSK pain or swelling Skin: Negative for color change and worsening wound.  Neurological: Negative for tremors and numbness other than noted  Psychiatric/Behavioral: Negative for decreased concentration or agitation other than above        Objective:   Physical Exam BP 122/68  Pulse 62  Temp(Src) 98.8 F (37.1 C) (Oral)  Wt 174 lb (78.926 kg)  SpO2 97% VS noted, mild ill Constitutional: Pt appears well-developed, well-nourished.  HENT: Head: NCAT.  Right Ear: External ear normal.  Left Ear: External ear normal.  Eyes: . Pupils are  equal, round, and reactive to light. Conjunctivae and EOM are normal Neck: Normal range of motion. Neck supple.  Cardiovascular: Normal rate and regular rhythm.   Pulmonary/Chest: Effort normal and breath sounds normal.  - no rales or wheezing Neurological: Pt is alert. Not confused , motor grossly intact Skin: Skin is warm. No rash Psychiatric: Pt behavior is normal. No agitation.     Assessment & Plan:

## 2013-07-16 ENCOUNTER — Other Ambulatory Visit: Payer: Medicare Other

## 2013-07-16 DIAGNOSIS — E538 Deficiency of other specified B group vitamins: Secondary | ICD-10-CM

## 2013-07-16 LAB — VITAMIN B12: VITAMIN B 12: 564 pg/mL (ref 211–911)

## 2013-07-19 NOTE — Assessment & Plan Note (Signed)
Mild to mod, for antibx course,  to f/u any worsening symptoms or concerns, cant ro pna - for cxr

## 2013-07-19 NOTE — Assessment & Plan Note (Signed)
stable overall by history and exam, recent data reviewed with pt, and pt to continue medical treatment as before,  to f/u any worsening symptoms or concerns BP Readings from Last 3 Encounters:  07/14/13 122/68  06/24/13 138/76  04/20/13 122/60

## 2013-07-22 ENCOUNTER — Encounter: Payer: Self-pay | Admitting: Internal Medicine

## 2013-08-12 ENCOUNTER — Ambulatory Visit: Payer: Medicare Other | Admitting: Internal Medicine

## 2013-08-18 ENCOUNTER — Encounter: Payer: Self-pay | Admitting: Gastroenterology

## 2013-08-26 ENCOUNTER — Ambulatory Visit (INDEPENDENT_AMBULATORY_CARE_PROVIDER_SITE_OTHER): Payer: Medicare Other | Admitting: Internal Medicine

## 2013-08-26 ENCOUNTER — Encounter: Payer: Self-pay | Admitting: Internal Medicine

## 2013-08-26 VITALS — BP 124/70 | HR 60 | Temp 98.2°F | Ht 72.0 in | Wt 171.0 lb

## 2013-08-26 DIAGNOSIS — Z23 Encounter for immunization: Secondary | ICD-10-CM

## 2013-08-26 DIAGNOSIS — Z0000 Encounter for general adult medical examination without abnormal findings: Secondary | ICD-10-CM

## 2013-08-26 NOTE — Progress Notes (Signed)
Pre visit review using our clinic review tool, if applicable. No additional management support is needed unless otherwise documented below in the visit note. 

## 2013-08-26 NOTE — Progress Notes (Signed)
Subjective:    Patient ID: [name], male    DOB: [birth year], 80 y.o.   MRN: [id number]  HPI  New patient to me, here to establish with PCP following retirement of MEN   Here for medicare wellness  Diet: heart healthy  Physical activity: sedentary Depression/mood screen: negative Hearing: intact to whispered voice Visual acuity: grossly normal, performs annual eye exam  ADLs: capable Fall risk: none Home safety: good Cognitive evaluation: intact to orientation, naming, recall and repetition EOL planning: adv directives, reviewed  I have personally reviewed and have noted 1. The patient's medical and social history 2. Their use of alcohol, tobacco or illicit drugs 3. Their current medications and supplements 4. The patient's functional ability including ADL's, fall risks, home safety risks and hearing or visual impairment. 5. Diet and physical activities 6. Evidence for depression or mood disorders  Also reviewed chronic medical issues and interval medical events  Past Medical History  Diagnosis Date  . Hypertension     well-controlled.  [name] CAD (coronary artery disease)     a. S/P stenting to mid RCA, prox PDA 06/1999. b. NSTEMI/CABG x 3 in 10/2011 with LIMA to LAD, SVG to PDA, and SVG to OM1.   . CKD (chronic kidney disease), stage III     a. stable with a creatinine around 1.9-2.0 followed by nephrology.  . Dyslipidemia   . Neck injury     a. C3-C4 and C4-C5 foraminal narrowing, severe  . Renal artery stenosis     a. 50% by cath 2001  . Pulmonary nodule     a. felt to be noncancerous.  Status post followup CT scan 4 mm and stable.  . Chronic UTI     a. Followed by Dr. Risa Grill - colonized/asymptomatic - not on abx  . Atrial fibrillation     a. Post-op from CABG, on amiodarone temporarily, d/c'd 12/2011  . Acute superficial venous thrombosis of lower extremity     a. RLE after CABG, neg dopp for DVT.  [name] Prostate cancer     a. 2001 s/p TURP.  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 Coronary artery disease Father     died @ 56  . Cancer Brother     Bladder  . Diabetes Neg Hx   . Prostate cancer Brother   . Nephritis Brother     died @ age 56.  . Other Brother     cerebral hemorrhage - died @ 62  . Other Mother     cerebral hemorrhage - died @ 76  . Heart disease Brother   . Arthritis Mother   . Breast cancer Other     niece x 2   History  Substance Use Topics  . Smoking status: Never Smoker   . Smokeless tobacco: Never Used  . Alcohol Use: Yes     Comment: Rarely    Review of Systems  Constitutional: Negative for fever, activity change, appetite change, fatigue and unexpected weight change.  Respiratory: Negative for cough, chest tightness, shortness of breath and wheezing.   Cardiovascular: Negative for chest pain, palpitations and leg swelling.  Neurological: Negative for dizziness, weakness and headaches.  Psychiatric/Behavioral: Negative for dysphoric mood. The patient is not nervous/anxious.   All other systems reviewed and are negative.      Objective:   Physical Exam  BP 124/70  Pulse  60  Temp(Src) 98.2 F (36.8 C) (Oral)  Ht 6' (1.829 m)  Wt 171 lb (77.565 kg)  BMI 23.19 kg/m2  SpO2 97% Wt Readings from Last 3 Encounters:  08/26/13 171 lb (77.565 kg)  07/14/13 174 lb (78.926 kg)  06/24/13 170 lb 12.8 oz (77.474 kg)   Constitutional: he appears well-developed and well-nourished. No distress. wife at side Neck: Normal range of motion. Neck supple. No JVD present. No thyromegaly present.  Cardiovascular: Normal rate, regular rhythm and normal heart sounds.  No murmur heard. No BLE edema. Pulmonary/Chest: Effort normal and breath sounds normal. No respiratory distress. he has no wheezes.  Skin: benign senile changes with SK, skin tag near left lower scapula - Psychiatric: he has a normal mood and affect.  His behavior is normal. Judgment and thought content normal.   Lab Results  Component Value Date   WBC 5.0 03/04/2013   HGB 12.8* 03/04/2013   HCT 41.1 03/04/2013   PLT 224.0 03/04/2013   GLUCOSE 87 03/04/2013   CHOL 114 11/27/2012   TRIG 71.0 11/27/2012   HDL 34.20* 11/27/2012   LDLCALC 66 11/27/2012   ALT 23 11/27/2012   AST 22 11/27/2012   NA 138 03/04/2013   K 4.3 03/04/2013   CL 107 03/04/2013   CREATININE 2.1* 03/04/2013   BUN 32* 03/04/2013   CO2 25 03/04/2013   TSH 2.074 03/26/2012   PSA 0.44 07/18/2007   INR 1.2* 03/04/2013    Dg Chest 2 View  07/15/2013   CLINICAL DATA:  Cough, acute bronchitis.  EXAM: CHEST  2 VIEW  COMPARISON:  January 30, 2013.  FINDINGS: The heart size and mediastinal contours are within normal limits. Status post coronary artery bypass graft. Left-sided pacemaker is unchanged in position. No pneumothorax or pleural effusion is noted. Both lungs are clear. The visualized skeletal structures are unremarkable.  IMPRESSION: No acute cardiopulmonary abnormality seen.   Electronically Signed   By: Sabino Dick M.D.   On: 07/15/2013 08:41       Assessment & Plan:   AWV/v70.0 - Today patient counseled on age appropriate routine health concerns for screening and prevention, each reviewed and up to date or declined. Immunizations reviewed and up to date or declined. Labs/ECG reviewed. Risk factors for depression reviewed and negative. Hearing function and visual acuity are intact. ADLs screened and addressed as needed. Functional ability and level of safety reviewed and appropriate. Education, counseling and referrals performed based on assessed risks today. Patient provided with a copy of personalized plan for preventive services.

## 2013-08-26 NOTE — Patient Instructions (Addendum)
It was good to see you today.  We have reviewed your prior records including labs and tests today  Health Maintenance reviewed - annual flu shot updated today - all recommended immunizations and age-appropriate screenings are up-to-date.  Medications reviewed and updated, no changes recommended at this time. Let me know when refills are due  Please schedule followup in 6-12 months for semiannual review, call sooner if problems.  Health Maintenance A healthy lifestyle and preventative care can promote health and wellness.  Maintain regular health, dental, and eye exams.  Eat a healthy diet. Foods like vegetables, fruits, whole grains, low-fat dairy products, and lean protein foods contain the nutrients you need and are low in calories. Decrease your intake of foods high in solid fats, added sugars, and salt. Get information about a proper diet from your health care provider, if necessary.  Regular physical exercise is one of the most important things you can do for your health. Most adults should get at least 150 minutes of moderate-intensity exercise (any activity that increases your heart rate and causes you to sweat) each week. In addition, most adults need muscle-strengthening exercises on 2 or more days a week.   Maintain a healthy weight. The body mass index (BMI) is a screening tool to identify possible weight problems. It provides an estimate of body fat based on height and weight. Your health care provider can find your BMI and can help you achieve or maintain a healthy weight. For males 20 years and older:  A BMI below 18.5 is considered underweight.  A BMI of 18.5 to 24.9 is normal.  A BMI of 25 to 29.9 is considered overweight.  A BMI of 30 and above is considered obese.  Maintain normal blood lipids and cholesterol by exercising and minimizing your intake of saturated fat. Eat a balanced diet with plenty of fruits and vegetables. Blood tests for lipids and cholesterol  should begin at age 71 and be repeated every 5 years. If your lipid or cholesterol levels are high, you are over age 33, or you are at high risk for heart disease, you may need your cholesterol levels checked more frequently.Ongoing high lipid and cholesterol levels should be treated with medicines if diet and exercise are not working.  If you smoke, find out from your health care provider how to quit. If you do not use tobacco, do not start.  Lung cancer screening is recommended for adults aged 40-80 years who are at high risk for developing lung cancer because of a history of smoking. A yearly low-dose CT scan of the lungs is recommended for people who have at least a 30-pack-year history of smoking and are current smokers or have quit within the past 15 years. A pack year of smoking is smoking an average of 1 pack of cigarettes a day for 1 year (for example, a 30-pack-year history of smoking could mean smoking 1 pack a day for 30 years or 2 packs a day for 15 years). Yearly screening should continue until the smoker has stopped smoking for at least 15 years. Yearly screening should be stopped for people who develop a health problem that would prevent them from having lung cancer treatment.  If you choose to drink alcohol, do not have more than 2 drinks per day. One drink is considered to be 12 oz (360 mL) of beer, 5 oz (150 mL) of wine, or 1.5 oz (45 mL) of liquor.  Avoid the use of street drugs. Do not share  needles with anyone. Ask for help if you need support or instructions about stopping the use of drugs.  High blood pressure causes heart disease and increases the risk of stroke. Blood pressure should be checked at least every 1-2 years. Ongoing high blood pressure should be treated with medicines if weight loss and exercise are not effective.  If you are 24-44 years old, ask your health care provider if you should take aspirin to prevent heart disease.  Diabetes screening involves taking a  blood sample to check your fasting blood sugar level. This should be done once every 3 years after age 44 if you are at a normal weight and without risk factors for diabetes. Testing should be considered at a younger age or be carried out more frequently if you are overweight and have at least 1 risk factor for diabetes.  Colorectal cancer can be detected and often prevented. Most routine colorectal cancer screening begins at the age of 20 and continues through age 6. However, your health care provider may recommend screening at an earlier age if you have risk factors for colon cancer. On a yearly basis, your health care provider may provide home test kits to check for hidden blood in the stool. A small camera at the end of a tube may be used to directly examine the colon (sigmoidoscopy or colonoscopy) to detect the earliest forms of colorectal cancer. Talk to your health care provider about this at age 45 when routine screening begins. A direct exam of the colon should be repeated every 5-10 years through age 65, unless early forms of precancerous polyps or small growths are found.  People who are at an increased risk for hepatitis B should be screened for this virus. You are considered at high risk for hepatitis B if:  You were born in a country where hepatitis B occurs often. Talk with your health care provider about which countries are considered high risk.  Your parents were born in a high-risk country and you have not received a shot to protect against hepatitis B (hepatitis B vaccine).  You have HIV or AIDS.  You use needles to inject street drugs.  You live with, or have sex with, someone who has hepatitis B.  You are a man who has sex with other men (MSM).  You get hemodialysis treatment.  You take certain medicines for conditions like cancer, organ transplantation, and autoimmune conditions.  Hepatitis C blood testing is recommended for all people born from [birth year] through 1963 and any  individual with known risk factors for hepatitis C.  Healthy men should no longer receive prostate-specific antigen (PSA) blood tests as part of routine cancer screening. Talk to your health care provider about prostate cancer screening.  Testicular cancer screening is not recommended for adolescents or adult males who have no symptoms. Screening includes self-exam, a health care provider exam, and other screening tests. Consult with your health care provider about any symptoms you have or any concerns you have about testicular cancer.  Practice safe sex. Use condoms and avoid high-risk sexual practices to reduce the spread of sexually transmitted infections (STIs).  You should be screened for STIs, including gonorrhea and chlamydia if:  You are sexually active and are younger than 24 years.  You are older than 24 years, and your health care provider tells you that you are at risk for this type of infection.  Your sexual activity has changed since you were last screened, and you are at an increased  risk for chlamydia or gonorrhea. Ask your health care provider if you are at risk.  If you are at risk of being infected with HIV, it is recommended that you take a prescription medicine daily to prevent HIV infection. This is called pre-exposure prophylaxis (PrEP). You are considered at risk if:  You are a man who has sex with other men (MSM).  You are a heterosexual man who is sexually active with multiple partners.  You take drugs by injection.  You are sexually active with a partner who has HIV.  Talk with your health care provider about whether you are at high risk of being infected with HIV. If you choose to begin PrEP, you should first be tested for HIV. You should then be tested every 3 months for as long as you are taking PrEP.  Use sunscreen. Apply sunscreen liberally and repeatedly throughout the day. You should seek shade when your shadow is shorter than you. Protect yourself by  wearing long sleeves, pants, a wide-brimmed hat, and sunglasses year round whenever you are outdoors.  Tell your health care provider of new moles or changes in moles, especially if there is a change in shape or color. Also, tell your health care provider if a mole is larger than the size of a pencil eraser.  A one-time screening for abdominal aortic aneurysm (AAA) and surgical repair of large AAAs by ultrasound is recommended for men aged 51-75 years who are current or former smokers.  Stay current with your vaccines (immunizations). Document Released: 06/23/2007 Document Revised: 12/30/2012 Document Reviewed: 05/22/2010 [facility] Patient Information 2015 [place], [place]. This information is not intended to replace advice given to you by your health care provider. Make sure you discuss any questions you have with your health care provider.

## 2013-09-10 LAB — HEPATIC FUNCTION PANEL
ALK PHOS: 57 U/L (ref 25–125)
ALT: 15 U/L (ref 10–40)
AST: 19 U/L (ref 14–40)
BILIRUBIN, TOTAL: 0.8 mg/dL

## 2013-09-10 LAB — CBC AND DIFFERENTIAL
HCT: 40 % — AB (ref 41–53)
Hemoglobin: 13.6 g/dL (ref 13.5–17.5)
Neutrophils Absolute: 4 /uL
PLATELETS: 191 10*3/uL (ref 150–399)
WBC: 5.6 10^3/mL

## 2013-09-10 LAB — LIPID PANEL
Cholesterol: 110 mg/dL (ref 0–200)
HDL: 36 mg/dL (ref 35–70)
LDL Cholesterol: 50 mg/dL
Triglycerides: 120 mg/dL (ref 40–160)

## 2013-09-10 LAB — BASIC METABOLIC PANEL
BUN: 30 mg/dL — AB (ref 4–21)
CREATININE: 2.1 mg/dL — AB (ref 0.6–1.3)
Glucose: 72 mg/dL
Potassium: 5.3 mmol/L (ref 3.4–5.3)
SODIUM: 139 mmol/L (ref 137–147)

## 2013-09-24 ENCOUNTER — Encounter: Payer: Self-pay | Admitting: Internal Medicine

## 2013-09-24 LAB — URINALYSIS
BILIRUBIN UA: NEGATIVE
Glucose, UA: NEGATIVE
KETONES UA: POSITIVE — AB
NITRITE UA: NEGATIVE
Occult Blood: NEGATIVE
PH UA: 5.5 (ref 4.5–8.0)
Protein, UA: NEGATIVE
UUROB: 0.2
WBC UA: NONE SEEN

## 2013-09-30 ENCOUNTER — Encounter: Payer: Self-pay | Admitting: Internal Medicine

## 2013-09-30 ENCOUNTER — Ambulatory Visit (INDEPENDENT_AMBULATORY_CARE_PROVIDER_SITE_OTHER): Payer: Medicare Other | Admitting: Internal Medicine

## 2013-09-30 VITALS — BP 94/58 | HR 63 | Temp 98.1°F | Wt 175.1 lb

## 2013-09-30 DIAGNOSIS — R059 Cough, unspecified: Secondary | ICD-10-CM

## 2013-09-30 DIAGNOSIS — R05 Cough: Secondary | ICD-10-CM

## 2013-09-30 DIAGNOSIS — I2510 Atherosclerotic heart disease of native coronary artery without angina pectoris: Secondary | ICD-10-CM

## 2013-09-30 DIAGNOSIS — J029 Acute pharyngitis, unspecified: Secondary | ICD-10-CM

## 2013-09-30 NOTE — Patient Instructions (Signed)
Zicam Melts or Zinc lozenges as per package label for sore throat . Complementary options include  vitamin C 2000 mg daily; & Echinacea for 4-7 days. Report persistent or progressive fever; discolored nasal or chest secretions; or frontal headache or facial  pain.  Carry room temperature water and sip liberally after coughing.Robitussin DM as per label.

## 2013-09-30 NOTE — Progress Notes (Signed)
Pre visit review using our clinic review tool, if applicable. No additional management support is needed unless otherwise documented below in the visit note. 

## 2013-09-30 NOTE — Progress Notes (Signed)
   Subjective:    Patient ID: [name], male    DOB: 1926/11/30, 79 y.o.   MRN: [id number]  HPI  He describes a sore throat since 09/29/13: This disturbed sleep last night.  He also developed a nonproductive cough.     Review of Systems Frontal headache, facial pain , nasal purulence, dental pain,  otic pain or otic discharge denied. No fever , chills or sweats.  He has no purulent sputum, shortness of breath, wheezing.  He denies any extrinsic symptoms.     Objective:   Physical Exam   Positive or pertinent findings include: There is minimal erythema of the oropharynx. There is no exudate. He has no cervical lymphadenopathy. Initially he had minor rales at the right lower lobe which resolved with deep breathing.  General appearance:good health ;well nourished; no acute distress or increased work of breathing is present. Appears younger than stated age No  lymphadenopathy about the head, neck, or axilla noted.   Eyes: No conjunctival inflammation or lid edema is present. There is no scleral icterus.  Ears:  External ear exam shows no significant lesions or deformities.  Otoscopic examination reveals clear canals, tympanic membranes are intact bilaterally without bulging, retraction, inflammation or discharge.  Nose:  External nasal examination shows no deformity or inflammation. Nasal mucosa are pink and moist without lesions or exudates. No septal dislocation or deviation.No obstruction to airflow.   Oral exam: Dental hygiene is good; lips and gums are healthy appearing.  Neck:  No deformities, thyromegaly, masses, or tenderness noted.   Supple with full range of motion without pain.   Heart:  Normal rate and regular rhythm. S1 and S2 normal without gallop, murmur, click, rub or other extra sounds.   Lungs:Chest clear to auscultation; no wheezes, rhonchi,or rubs present.No increased work of breathing.    Extremities:  No cyanosis, edema, or clubbing  noted     Skin: Warm & dry w/o jaundice or tenting.        Assessment & Plan:  #1 sore throat; all 4 Centor criteria are negative  #2 cough  Plan: See AVS

## 2013-10-05 ENCOUNTER — Ambulatory Visit (INDEPENDENT_AMBULATORY_CARE_PROVIDER_SITE_OTHER): Payer: Medicare Other | Admitting: *Deleted

## 2013-10-05 ENCOUNTER — Encounter: Payer: Self-pay | Admitting: Internal Medicine

## 2013-10-05 DIAGNOSIS — I498 Other specified cardiac arrhythmias: Secondary | ICD-10-CM

## 2013-10-05 DIAGNOSIS — R001 Bradycardia, unspecified: Secondary | ICD-10-CM

## 2013-10-05 LAB — MDC_IDC_ENUM_SESS_TYPE_REMOTE
Battery Voltage: 2.79 V
Brady Statistic AS VP Percent: 0.2 %
Brady Statistic AS VS Percent: 65.5 %
Lead Channel Impedance Value: 611 Ohm
Lead Channel Pacing Threshold Amplitude: 0.75 V
Lead Channel Pacing Threshold Pulse Width: 0.4 ms
Lead Channel Pacing Threshold Pulse Width: 0.4 ms
Lead Channel Setting Sensing Sensitivity: 5.6 mV
MDC IDC MSMT BATTERY IMPEDANCE: 135 Ohm
MDC IDC MSMT LEADCHNL RA IMPEDANCE VALUE: 452 Ohm
MDC IDC MSMT LEADCHNL RA PACING THRESHOLD AMPLITUDE: 0.5 V
MDC IDC MSMT LEADCHNL RA SENSING INTR AMPL: 2.8 mV
MDC IDC MSMT LEADCHNL RV SENSING INTR AMPL: 16 mV
MDC IDC SET LEADCHNL RA PACING AMPLITUDE: 2 V
MDC IDC SET LEADCHNL RV PACING AMPLITUDE: 2.5 V
MDC IDC SET LEADCHNL RV PACING PULSEWIDTH: 0.4 ms
MDC IDC STAT BRADY AP VP PERCENT: 0.1 % — AB
MDC IDC STAT BRADY AP VS PERCENT: 34.2 %

## 2013-10-05 NOTE — Progress Notes (Signed)
Remote pacemaker transmission.   

## 2013-10-12 ENCOUNTER — Encounter: Payer: Self-pay | Admitting: Cardiology

## 2013-11-03 ENCOUNTER — Other Ambulatory Visit (INDEPENDENT_AMBULATORY_CARE_PROVIDER_SITE_OTHER): Payer: Medicare Other

## 2013-11-03 DIAGNOSIS — I2510 Atherosclerotic heart disease of native coronary artery without angina pectoris: Secondary | ICD-10-CM

## 2013-11-03 DIAGNOSIS — I10 Essential (primary) hypertension: Secondary | ICD-10-CM

## 2013-11-03 DIAGNOSIS — E785 Hyperlipidemia, unspecified: Secondary | ICD-10-CM

## 2013-11-03 LAB — LIPID PANEL
CHOL/HDL RATIO: 3
Cholesterol: 109 mg/dL (ref 0–200)
HDL: 33.4 mg/dL — ABNORMAL LOW (ref >39.00)
LDL Cholesterol: 61 mg/dL (ref 0–99)
NONHDL: 75.6
Triglycerides: 71 mg/dL (ref 0.0–149.0)
VLDL: 14.2 mg/dL (ref 0.0–40.0)

## 2013-11-03 LAB — HEPATIC FUNCTION PANEL
ALT: 13 U/L (ref 0–53)
AST: 16 U/L (ref 0–37)
Albumin: 3.7 g/dL (ref 3.5–5.2)
Alkaline Phosphatase: 49 U/L (ref 39–117)
BILIRUBIN DIRECT: 0.2 mg/dL (ref 0.0–0.3)
BILIRUBIN TOTAL: 1.1 mg/dL (ref 0.2–1.2)
Total Protein: 7 g/dL (ref 6.0–8.3)

## 2013-11-12 ENCOUNTER — Encounter: Payer: Self-pay | Admitting: Cardiovascular Disease

## 2013-11-12 ENCOUNTER — Ambulatory Visit (INDEPENDENT_AMBULATORY_CARE_PROVIDER_SITE_OTHER): Payer: Medicare Other | Admitting: Cardiovascular Disease

## 2013-11-12 VITALS — BP 134/72 | HR 61 | Ht 70.0 in | Wt 171.8 lb

## 2013-11-12 DIAGNOSIS — E785 Hyperlipidemia, unspecified: Secondary | ICD-10-CM

## 2013-11-12 DIAGNOSIS — I10 Essential (primary) hypertension: Secondary | ICD-10-CM

## 2013-11-12 DIAGNOSIS — I25810 Atherosclerosis of coronary artery bypass graft(s) without angina pectoris: Secondary | ICD-10-CM

## 2013-11-12 DIAGNOSIS — I2510 Atherosclerotic heart disease of native coronary artery without angina pectoris: Secondary | ICD-10-CM

## 2013-11-12 NOTE — Progress Notes (Signed)
Background: The patient is followed for coronary artery disease. He underwent multivessel CABG in 2013 after presenting with non-ST elevation MI. He had previously undergone coronary stenting dating back to 2001. He was found to have severe three-vessel obstructive disease. Left ventricular function was normal. He was bypassed with the LIMA to LAD, vein graft obtuse marginal, and vein graft to PDA. He also required permanent pacing in 2014 after presenting with second degree type II AV block.cardiac catheterization was performed in March 2015 after an abnormal nuclear scan.this demonstrated continued patency of the LIMA to LAD graft and early graft failure of the saphenous vein grafts. The patient had collateral filling of the RCA distribution and moderate stenosis of the native left circumflex. Medical therapy was recommended.  HPI:  80 year old gentleman presenting for follow-up evaluation. The patient is doing well. He continues with regular exercise and denies exertional symptoms. He does have some degree of generalized fatigue. He has no chest pain, chest pressure, or shortness of breath. He is compliant with his medications and he follows a cardiac diet.  Studies:  Lipid Panel 11/03/2013.    Component Value Date/Time   CHOL 109 11/03/2013 0948   TRIG 71.0 11/03/2013 0948   HDL 33.40* 11/03/2013 0948   CHOLHDL 3 11/03/2013 0948   VLDL 14.2 11/03/2013 0948   LDLCALC 61 11/03/2013 0948     Outpatient Encounter Prescriptions as of 11/12/2013  Medication Sig  . aspirin 325 MG buffered tablet Take 325 mg by mouth daily.    . carboxymethylcellulose (REFRESH PLUS) 0.5 % SOLN Place 1 drop into both eyes daily as needed (dry eyes).   . ergocalciferol (VITAMIN D2) 50000 UNITS capsule Take 50,000 Units by mouth every 30 (thirty) days. 15 th of each month  . fluticasone (FLONASE) 50 MCG/ACT nasal spray Place 1 spray into both nostrils daily as needed for allergies or rhinitis.  . folic acid  (FOLVITE) 1 MG tablet Take 1 tablet (1 mg total) by mouth daily.  . metoprolol succinate (TOPROL-XL) 25 MG 24 hr tablet Take 25 mg by mouth 2 (two) times daily.   . nitroGLYCERIN (NITROSTAT) 0.4 MG SL tablet Place 1 tablet (0.4 mg total) under the tongue every 5 (five) minutes as needed for chest pain.  . potassium chloride (K-DUR) 10 MEQ tablet Take 1 tablet (10 mEq total) by mouth daily.  . simvastatin (ZOCOR) 20 MG tablet Take 1 tablet (20 mg total) by mouth at bedtime.  . vitamin B-12 (CYANOCOBALAMIN) 1000 MCG tablet Take 1,000 mcg by mouth daily.  . vitamin C (ASCORBIC ACID) 500 MG tablet Take 1,500 mg by mouth daily.    Allergies  Allergen Reactions  . Amoxicillin Nausea Only  . Aspirin Other (See Comments)    REACTION: upset stomach; tolerates coated aspirin  . Atarax [Hydroxyzine Hcl] Other (See Comments)    REACTION: unknown  . Ciprofloxacin Nausea Only  . Codeine     REACTION: Stomach upset  . Hydrocodone Nausea Only  . Hydroxyzine Nausea And Vomiting  . Macrobid [Nitrofurantoin Macrocrystal] Nausea Only  . Niacin Other (See Comments)    REACTION: unknown  . Niacin-Lovastatin Er     Unsure of reaction. Taking simvastatin at home without problems  . Nitrofurantoin Other (See Comments)    REACTION: upset stomach  . Trimox [Amoxicillin Trihydrate] Other (See Comments)    REACTION: unknown  . Bactrim [Sulfamethoxazole-Trimethoprim] Rash    Past Medical History  Diagnosis Date  . Hypertension     well-controlled.  [name] CAD (  coronary artery disease)     a. S/P stenting to mid RCA, prox PDA 06/1999. b. NSTEMI/CABG x 3 in 10/2011 with LIMA to LAD, SVG to PDA, and SVG to OM1.   . CKD (chronic kidney disease), stage III     a. stable with a creatinine around 1.9-2.0 followed by nephrology.  . Dyslipidemia   . Neck injury     a. C3-C4 and C4-C5 foraminal narrowing, severe  . Renal artery stenosis     a. 50% by cath 2001  . Pulmonary nodule     a. felt to be noncancerous.   Status post followup CT scan 4 mm and stable.  . Chronic UTI     a. Followed by Dr. Risa Grill - colonized/asymptomatic - not on abx  . Atrial fibrillation     a. Post-op from CABG, on amiodarone temporarily, d/c'd 12/2011  . Acute superficial venous thrombosis of lower extremity     a. RLE after CABG, neg dopp for DVT.  [name] Prostate cancer     a. 2001 s/p TURP.  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 There is no history of Diabetes.   ROS: Negative except as per HPI  BP 134/72 mmHg  Pulse 61  Ht 5\' 10"  (1.778 m)  Wt 171 lb 12.8 oz (77.928 kg)  BMI 24.65 kg/m2  PHYSICAL EXAM: Pt is alert and oriented, NAD HEENT: normal Neck: JVP - normal, carotids 2+= without bruits Lungs: CTA bilaterally CV: RRR without murmur or gallop Abd: soft, NT, Positive BS, no hepatomegaly Ext: no C/C/E, distal pulses intact and equal Skin: warm/dry no rash  EKG:  Atrial paced rhythm 61 bpm, prolonged AV conduction.  ASSESSMENT AND PLAN: 1. Coronary artery disease status post CABG with multivessel native CAD and saphenous pain graft occlusion. The patient is currently stable without symptoms of angina. We again reviewed his coronary anatomy in detail, with known patency of his LIMA to LAD graft, left to right collateral supplying the RCA distribution, and moderate left circumflex stenosis. He will continue on his current medical program.  2. Hyperlipidemia.lipids are excellent as outlined above. Continue simvastatin 20 mg daily.  [name], MD 11/12/2013 10:34 AM

## 2013-11-12 NOTE — Patient Instructions (Signed)
Your physician wants you to follow-up in: 6 MONTHS with Dr Cooper.  You will receive a reminder letter in the mail two months in advance. If you don't receive a letter, please call our office to schedule the follow-up appointment.  Your physician recommends that you continue on your current medications as directed. Please refer to the Current Medication list given to you today.  

## 2013-12-17 ENCOUNTER — Encounter (HOSPITAL_COMMUNITY): Payer: Self-pay | Admitting: Cardiology

## 2014-01-06 ENCOUNTER — Ambulatory Visit (INDEPENDENT_AMBULATORY_CARE_PROVIDER_SITE_OTHER): Payer: Medicare Other | Admitting: *Deleted

## 2014-01-06 DIAGNOSIS — R001 Bradycardia, unspecified: Secondary | ICD-10-CM

## 2014-01-06 NOTE — Progress Notes (Signed)
Remote pacemaker transmission.   

## 2014-01-07 LAB — MDC_IDC_ENUM_SESS_TYPE_REMOTE
Battery Impedance: 136 Ohm
Battery Voltage: 2.79 V
Brady Statistic AP VS Percent: 36 %
Brady Statistic AS VP Percent: 0 %
Date Time Interrogation Session: 20151230143804
Lead Channel Impedance Value: 466 Ohm
Lead Channel Impedance Value: 625 Ohm
Lead Channel Pacing Threshold Amplitude: 0.5 V
Lead Channel Pacing Threshold Amplitude: 0.75 V
Lead Channel Pacing Threshold Pulse Width: 0.4 ms
Lead Channel Sensing Intrinsic Amplitude: 16 mV
Lead Channel Sensing Intrinsic Amplitude: 2.8 mV
Lead Channel Setting Pacing Amplitude: 2 V
Lead Channel Setting Sensing Sensitivity: 5.6 mV
MDC IDC MSMT BATTERY REMAINING LONGEVITY: 145 mo
MDC IDC MSMT LEADCHNL RA PACING THRESHOLD PULSEWIDTH: 0.4 ms
MDC IDC SET LEADCHNL RV PACING AMPLITUDE: 2.5 V
MDC IDC SET LEADCHNL RV PACING PULSEWIDTH: 0.4 ms
MDC IDC STAT BRADY AP VP PERCENT: 0 %
MDC IDC STAT BRADY AS VS PERCENT: 64 %

## 2014-01-15 ENCOUNTER — Encounter: Payer: Self-pay | Admitting: *Deleted

## 2014-01-25 ENCOUNTER — Other Ambulatory Visit: Payer: Self-pay

## 2014-01-25 DIAGNOSIS — E7800 Pure hypercholesterolemia, unspecified: Secondary | ICD-10-CM

## 2014-01-25 MED ORDER — CARBOXYMETHYLCELLULOSE SODIUM 0.5 % OP SOLN: 1.0000 [drp] | Freq: Every day | OPHTHALMIC | Status: DC | PRN

## 2014-01-25 MED ORDER — POTASSIUM CHLORIDE ER 10 MEQ PO TBCR: 10.0000 meq | EXTENDED_RELEASE_TABLET | Freq: Every day | ORAL | Status: DC

## 2014-01-25 MED ORDER — SIMVASTATIN 20 MG PO TABS: 20.0000 mg | ORAL_TABLET | Freq: Every day | ORAL | Status: DC

## 2014-01-25 MED ORDER — METOPROLOL SUCCINATE ER 25 MG PO TB24: 25.0000 mg | ORAL_TABLET | Freq: Two times a day (BID) | ORAL | Status: DC

## 2014-01-28 ENCOUNTER — Encounter: Payer: Self-pay | Admitting: Internal Medicine

## 2014-02-11 ENCOUNTER — Other Ambulatory Visit: Payer: Self-pay | Admitting: Geriatric Medicine

## 2014-02-11 MED ORDER — NITROGLYCERIN 0.4 MG SL SUBL: 0.4000 mg | SUBLINGUAL_TABLET | SUBLINGUAL | Status: DC | PRN

## 2014-02-23 ENCOUNTER — Other Ambulatory Visit: Payer: Self-pay

## 2014-02-23 MED ORDER — CLOTRIMAZOLE-BETAMETHASONE 1-0.05 % EX CREA: 1.0000 "application " | TOPICAL_CREAM | CUTANEOUS | Status: DC | PRN

## 2014-02-23 MED ORDER — FOLIC ACID 1 MG PO TABS: 1.0000 mg | ORAL_TABLET | Freq: Every day | ORAL | Status: DC

## 2014-03-02 ENCOUNTER — Ambulatory Visit (INDEPENDENT_AMBULATORY_CARE_PROVIDER_SITE_OTHER): Payer: Medicare Other | Admitting: Internal Medicine

## 2014-03-02 ENCOUNTER — Encounter: Payer: Self-pay | Admitting: Internal Medicine

## 2014-03-02 ENCOUNTER — Ambulatory Visit (INDEPENDENT_AMBULATORY_CARE_PROVIDER_SITE_OTHER)
Admission: RE | Admit: 2014-03-02 | Discharge: 2014-03-02 | Disposition: A | Discharge status: Home / Self Care | Payer: Medicare Other | Source: Ambulatory Visit | Attending: Internal Medicine | Admitting: Internal Medicine

## 2014-03-02 ENCOUNTER — Other Ambulatory Visit (INDEPENDENT_AMBULATORY_CARE_PROVIDER_SITE_OTHER): Payer: Medicare Other

## 2014-03-02 VITALS — BP 130/74 | HR 59 | Temp 98.4°F | Wt 175.0 lb

## 2014-03-02 DIAGNOSIS — E538 Deficiency of other specified B group vitamins: Secondary | ICD-10-CM

## 2014-03-02 DIAGNOSIS — I739 Peripheral vascular disease, unspecified: Secondary | ICD-10-CM

## 2014-03-02 DIAGNOSIS — M791 Myalgia, unspecified site: Secondary | ICD-10-CM | POA: Insufficient documentation

## 2014-03-02 DIAGNOSIS — M5020 Other cervical disc displacement, unspecified cervical region: Secondary | ICD-10-CM

## 2014-03-02 LAB — VITAMIN B12: Vitamin B-12: 433 pg/mL (ref 211–911)

## 2014-03-02 NOTE — Assessment & Plan Note (Signed)
Increasing numbness LLE with treadmill, resolved over time with discontinuation of activity Given CAD hx, check ABI r/o PAD Continue med mgmt as ongoing for CAD

## 2014-03-02 NOTE — Progress Notes (Signed)
Pre visit review using our clinic review tool, if applicable. No additional management support is needed unless otherwise documented below in the visit note. 

## 2014-03-02 NOTE — Patient Instructions (Signed)
It was good to see you today.  We have reviewed your prior records including labs and tests today  Test(s) ordered today. Your results will be released to [facility] (or called to you) after review, usually within 72hours after test completion. If any changes need to be made, you will be notified at that same time.  Medications reviewed and updated, no changes recommended at this time.  we'll make referral to [facility] for your ABI test as discussed Our office will contact you regarding appointment(s) once made.  Please schedule followup in 6 months, call sooner if problems.

## 2014-03-02 NOTE — Assessment & Plan Note (Signed)
Prev on monthly B12 injections, now daily B12 1,000 mcg   Check labs q6-12 mo prn Lab Results  Component Value Date   VITAMINB12 564 07/16/2013

## 2014-03-02 NOTE — Assessment & Plan Note (Signed)
Hx fusion in 1990s Now with "pop" at night, no pain Check plain film Encourage tylenol prn

## 2014-03-02 NOTE — Progress Notes (Signed)
Subjective:    Patient ID: [name], male    DOB: Jul 06, 1926, 81 y.o.   MRN: [id number]  HPI  Patient here for followup - reviewed chronic medical issues and interval events  Past Medical History  Diagnosis Date  . Hypertension     well-controlled.  [name] CAD (coronary artery disease)     a. S/P stenting to mid RCA, prox PDA 06/1999. b. NSTEMI/CABG x 3 in 10/2011 with LIMA to LAD, SVG to PDA, and SVG to OM1.   . CKD (chronic kidney disease), stage III     a. stable with a creatinine around 1.9-2.0 followed by nephrology.  . Dyslipidemia   . Neck injury     a. C3-C4 and C4-C5 foraminal narrowing, severe  . Renal artery stenosis     a. 50% by cath 2001  . Pulmonary nodule     a. felt to be noncancerous.  Status post followup CT scan 4 mm and stable.  . Chronic UTI     a. Followed by Dr. Risa Grill - colonized/asymptomatic - not on abx  . Atrial fibrillation     a. Post-op from CABG, on amiodarone temporarily, d/c'd 12/2011  . Acute superficial venous thrombosis of lower extremity     a. RLE after CABG, neg dopp for DVT.  [name] Prostate cancer     a. 2001 s/p TURP.  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  Respiratory: Negative for cough and shortness of breath.   Cardiovascular: Negative for chest pain and leg swelling.  Musculoskeletal: Negative for arthralgias.       Pop in neck at night, no pain, no numbness       Objective:    Physical Exam  Constitutional: He appears well-developed and well-nourished. No distress.  Cardiovascular: Normal rate, regular rhythm and normal heart sounds.   No murmur heard. ?slightly diminished L DP compared to R  Pulmonary/Chest: Effort normal and breath sounds normal. No respiratory distress.    BP 130/74 mmHg  Pulse 59  Temp(Src) 98.4 F (36.9 C) (Oral)  Wt 175 lb (79.379 kg)  SpO2 96% Wt Readings  from Last 3 Encounters:  03/02/14 175 lb (79.379 kg)  11/12/13 171 lb 12.8 oz (77.928 kg)  09/30/13 175 lb 2 oz (79.436 kg)    Lab Results  Component Value Date   WBC 5.6 09/10/2013   HGB 13.6 09/10/2013   HCT 40* 09/10/2013   PLT 191 09/10/2013   GLUCOSE 87 03/04/2013   CHOL 109 11/03/2013   TRIG 71.0 11/03/2013   HDL 33.40* 11/03/2013   LDLCALC 61 11/03/2013   ALT 13 11/03/2013   AST 16 11/03/2013   NA 139 09/10/2013   K 5.3 09/10/2013   CL 107 03/04/2013   CREATININE 2.1* 09/10/2013   BUN 30* 09/10/2013   CO2 25 03/04/2013   TSH 2.074 03/26/2012   PSA 0.44 07/18/2007   INR 1.2* 03/04/2013   Lab Results  Component Value Date   [id number] 07/16/2013    Dg Chest 2 View  07/15/2013   CLINICAL DATA:  Cough, acute bronchitis.  EXAM: CHEST  2 VIEW  COMPARISON:  January 30, 2013.  FINDINGS: The heart size and mediastinal contours are within normal limits. Status post coronary artery bypass graft. Left-sided pacemaker is unchanged in position. No pneumothorax or pleural effusion is noted. Both lungs are clear.  The visualized skeletal structures are unremarkable.  IMPRESSION: No acute cardiopulmonary abnormality seen.   Electronically Signed   By: Sabino Dick M.D.   On: 07/15/2013 08:41       Assessment & Plan:   Problem List Items Addressed This Visit    B12 nutritional deficiency    Prev on monthly B12 injections, now daily B12 1,000 mcg   Check labs q6-12 mo prn Lab Results  Component Value Date   [id number] 07/16/2013         Relevant Orders   Vitamin B12   Claudication of left lower extremity    Increasing numbness LLE with treadmill, resolved over time with discontinuation of activity Given CAD hx, check ABI r/o PAD Continue med mgmt as ongoing for CAD      Relevant Orders   Lower Extremity Arterial Doppler Bilateral   DEGENERATIVE DISC DISEASE, CERVICAL SPINE, W/RADICULOPATHY - Primary    Hx fusion in 1990s Now with "pop" at night, no  pain Check plain film Encourage tylenol prn      Relevant Orders   DG Cervical Spine 2 or 3 views       [name], MD

## 2014-03-08 ENCOUNTER — Encounter: Payer: Self-pay | Admitting: Nurse Practitioner

## 2014-03-08 ENCOUNTER — Ambulatory Visit (INDEPENDENT_AMBULATORY_CARE_PROVIDER_SITE_OTHER): Payer: Medicare Other | Admitting: Nurse Practitioner

## 2014-03-08 VITALS — BP 130/60 | HR 65 | Temp 98.3°F | Ht 72.0 in | Wt 175.0 lb

## 2014-03-08 DIAGNOSIS — H6593 Unspecified nonsuppurative otitis media, bilateral: Secondary | ICD-10-CM

## 2014-03-08 DIAGNOSIS — H6590 Unspecified nonsuppurative otitis media, unspecified ear: Secondary | ICD-10-CM | POA: Insufficient documentation

## 2014-03-08 DIAGNOSIS — H6120 Impacted cerumen, unspecified ear: Secondary | ICD-10-CM | POA: Insufficient documentation

## 2014-03-08 DIAGNOSIS — J069 Acute upper respiratory infection, unspecified: Secondary | ICD-10-CM

## 2014-03-08 DIAGNOSIS — H6121 Impacted cerumen, right ear: Secondary | ICD-10-CM

## 2014-03-08 LAB — POCT RAPID STREP A (OFFICE): RAPID STREP A SCREEN: NEGATIVE

## 2014-03-08 LAB — EAR CERUMEN REMOVAL

## 2014-03-08 NOTE — Progress Notes (Signed)
Pre visit review using our clinic review tool, if applicable. No additional management support is needed unless otherwise documented below in the visit note. 

## 2014-03-08 NOTE — Progress Notes (Signed)
   Subjective:    Patient ID: [name], male    DOB: [birth year], 81 y.o.   MRN: [id number]  Sore Throat  This is a new problem. The current episode started yesterday. The problem has been unchanged. Neither side of throat is experiencing more pain than the other. There has been no fever. The pain is mild. Associated symptoms include congestion (nasal) and a plugged ear sensation. Pertinent negatives include no coughing, headaches, hoarse voice or shortness of breath. He has had no exposure to strep. He has tried nothing for the symptoms.      Review of Systems  Constitutional: Negative for fever, chills and fatigue.  HENT: Positive for congestion (nasal). Negative for hoarse voice.   Respiratory: Negative for cough, chest tightness, shortness of breath and wheezing.   Neurological: Negative for headaches.       Objective:   Physical Exam  Constitutional: He is oriented to person, place, and time. He appears well-developed and well-nourished. No distress.  Accompanied by wife   HENT:  Head: Normocephalic and atraumatic.  Mouth/Throat: No oropharyngeal exudate.  Mild erythema posterior pharynx-pt states Dr Ralene Ok told him his throat is always slightly red. Ceruminosis R ear. Unsuccessful irrigation. Successful curretage-large plug removed. Canal slightly erythematous. Cloudy fluid bilat TM  Eyes: Conjunctivae are normal. Right eye exhibits no discharge. Left eye exhibits no discharge.  Neck: Normal range of motion. Neck supple. No thyromegaly present.  Cardiovascular: Normal rate and regular rhythm.   Murmur heard. Pulmonary/Chest: Effort normal and breath sounds normal. No respiratory distress. He has no wheezes. He has no rales.  Lymphadenopathy:    He has no cervical adenopathy.  Neurological: He is alert and oriented to person, place, and time.  Skin: Skin is warm and dry.  Psychiatric: He has a normal mood and affect. His behavior is normal. Thought content normal.    Vitals reviewed.         Assessment & Plan:  1. Acute upper respiratory infection Symptom management Daily sinus rinse  2. Ceruminosis, right Successful currettage Instructions for flush for maintenance  3. Otitis media with effusion, right  F/u PRN

## 2014-03-08 NOTE — Patient Instructions (Signed)
You have a virus causing your symptoms. The average duration of cold symptoms is 14 days.  For nasal congestion: Start daily sinus rinses (neilmed Sinus Rinse). Vicks vapor rub under nose to help breathe. For sore throat: Benzocaine throat lozenges for sore throat. Tylenol or ibuprophen for headache. Sip fluids every hour. Rest. If you are not feeling better in 10 days or develop fever or chest pain, call us for re-evaluation.  To keep ears clear of wax, irrigate with warm water 2 to 3 times/week when getting into shower. Feel better!  Upper Respiratory Infection, Adult An upper respiratory infection (URI) is also sometimes known as the common cold. The upper respiratory tract includes the nose, sinuses, throat, trachea, and bronchi. Bronchi are the airways leading to the lungs. Most people improve within 1 week, but symptoms can last up to 2 weeks. A residual cough may last even longer.  CAUSES Many different viruses can infect the tissues lining the upper respiratory tract. The tissues become irritated and inflamed and often become very moist. Mucus production is also common. A cold is contagious. You can easily spread the virus to others by oral contact. This includes kissing, sharing a glass, coughing, or sneezing. Touching your mouth or nose and then touching a surface, which is then touched by another person, can also spread the virus. SYMPTOMS  Symptoms typically develop 1 to 3 days after you come in contact with a cold virus. Symptoms vary from person to person. They may include:  Runny nose.  Sneezing.  Nasal congestion.  Sinus irritation.  Sore throat.  Loss of voice (laryngitis).  Cough.  Fatigue.  Muscle aches.  Loss of appetite.  Headache.  Low-grade fever. DIAGNOSIS  You might diagnose your own cold based on familiar symptoms, since most people get a cold 2 to 3 times a year. Your caregiver can confirm this based on your exam. Most importantly, your caregiver  can check that your symptoms are not due to another disease such as strep throat, sinusitis, pneumonia, asthma, or epiglottitis. Blood tests, throat tests, and X-rays are not necessary to diagnose a common cold, but they may sometimes be helpful in excluding other more serious diseases. Your caregiver will decide if any further tests are required. RISKS AND COMPLICATIONS  You may be at risk for a more severe case of the common cold if you smoke cigarettes, have chronic heart disease (such as heart failure) or lung disease (such as asthma), or if you have a weakened immune system. The very young and very old are also at risk for more serious infections. Bacterial sinusitis, middle ear infections, and bacterial pneumonia can complicate the common cold. The common cold can worsen asthma and chronic obstructive pulmonary disease (COPD). Sometimes, these complications can require emergency medical care and may be life-threatening. PREVENTION  The best way to protect against getting a cold is to practice good hygiene. Avoid oral or hand contact with people with cold symptoms. Wash your hands often if contact occurs. There is no clear evidence that vitamin C, vitamin E, echinacea, or exercise reduces the chance of developing a cold. However, it is always recommended to get plenty of rest and practice good nutrition. TREATMENT  Treatment is directed at relieving symptoms. There is no cure. Antibiotics are not effective, because the infection is caused by a virus, not by bacteria. Treatment may include:  Increased fluid intake. Sports drinks offer valuable electrolytes, sugars, and fluids.  Breathing heated mist or steam (vaporizer or shower).  Eating  chicken soup or other clear broths, and maintaining good nutrition.  Getting plenty of rest.  Using gargles or lozenges for comfort.  Controlling fevers with ibuprofen or acetaminophen as directed by your caregiver.  Increasing usage of your inhaler if you  have asthma. Zinc gel and zinc lozenges, taken in the first 24 hours of the common cold, can shorten the duration and lessen the severity of symptoms. Pain medicines may help with fever, muscle aches, and throat pain. A variety of non-prescription medicines are available to treat congestion and runny nose. Your caregiver can make recommendations and may suggest nasal or lung inhalers for other symptoms.  HOME CARE INSTRUCTIONS   Only take over-the-counter or prescription medicines for pain, discomfort, or fever as directed by your caregiver.  Use a warm mist humidifier or inhale steam from a shower to increase air moisture. This may keep secretions moist and make it easier to breathe.  Drink enough water and fluids to keep your urine clear or pale yellow.  Rest as needed.  Return to work when your temperature has returned to normal or as your caregiver advises. You may need to stay home longer to avoid infecting others. You can also use a face mask and careful hand washing to prevent spread of the virus. SEEK MEDICAL CARE IF:   After the first few days, you feel you are getting worse rather than better.  You need your caregiver's advice about medicines to control symptoms.  You develop chills, worsening shortness of breath, or brown or red sputum. These may be signs of pneumonia.  You develop yellow or brown nasal discharge or pain in the face, especially when you bend forward. These may be signs of sinusitis.  You develop a fever, swollen neck glands, pain with swallowing, or white areas in the back of your throat. These may be signs of strep throat. SEEK IMMEDIATE MEDICAL CARE IF:   You have a fever.  You develop severe or persistent headache, ear pain, sinus pain, or chest pain.  You develop wheezing, a prolonged cough, cough up blood, or have a change in your usual mucus (if you have chronic lung disease).  You develop sore muscles or a stiff neck. Document Released: 06/20/2000  Document Revised: 03/19/2011 Document Reviewed: 04/28/2010 [facility] Patient Information 2014 [place], [place].  Cerumen Impaction A cerumen impaction is when the wax in your ear forms a plug. This plug usually causes reduced hearing. Sometimes it also causes an earache or dizziness. Removing a cerumen impaction can be difficult and painful. The wax sticks to the ear canal. The canal is sensitive and bleeds easily. If you try to remove a heavy wax buildup with a cotton tipped swab, you may push it in further. Irrigation with water, suction, and small ear curettes may be used to clear out the wax. If the impaction is fixed to the skin in the ear canal, ear drops may be needed for a few days to loosen the wax. People who build up a lot of wax frequently can use ear wax removal products available in your local drugstore. SEEK MEDICAL CARE IF:  You develop an earache, increased hearing loss, or marked dizziness. Document Released: 02/02/2004 Document Revised: 03/19/2011 Document Reviewed: 03/24/2009 [facility] Patient Information 2015 [place], [place]. This information is not intended to replace advice given to you by your health care provider. Make sure you discuss any questions you have with your health care provider.

## 2014-03-08 NOTE — Addendum Note (Signed)
Addended by: Julieta Bellini on: 03/08/2014 03:24 PM   Modules accepted: Orders, SmartSet

## 2014-03-10 ENCOUNTER — Ambulatory Visit (HOSPITAL_COMMUNITY): Payer: Medicare Other | Attending: Internal Medicine | Admitting: Cardiology

## 2014-03-10 DIAGNOSIS — R0989 Other specified symptoms and signs involving the circulatory and respiratory systems: Secondary | ICD-10-CM

## 2014-03-10 DIAGNOSIS — R200 Anesthesia of skin: Secondary | ICD-10-CM | POA: Diagnosis not present

## 2014-03-10 DIAGNOSIS — I10 Essential (primary) hypertension: Secondary | ICD-10-CM | POA: Insufficient documentation

## 2014-03-10 DIAGNOSIS — E785 Hyperlipidemia, unspecified: Secondary | ICD-10-CM | POA: Diagnosis not present

## 2014-03-10 DIAGNOSIS — R209 Unspecified disturbances of skin sensation: Secondary | ICD-10-CM | POA: Insufficient documentation

## 2014-03-10 DIAGNOSIS — R202 Paresthesia of skin: Secondary | ICD-10-CM

## 2014-03-10 DIAGNOSIS — I2510 Atherosclerotic heart disease of native coronary artery without angina pectoris: Secondary | ICD-10-CM | POA: Diagnosis present

## 2014-03-10 DIAGNOSIS — Z951 Presence of aortocoronary bypass graft: Secondary | ICD-10-CM | POA: Diagnosis not present

## 2014-03-10 DIAGNOSIS — I739 Peripheral vascular disease, unspecified: Secondary | ICD-10-CM

## 2014-03-10 NOTE — Progress Notes (Signed)
Lower extremity arterial Doppler performed

## 2014-03-11 ENCOUNTER — Encounter: Payer: Self-pay | Admitting: Internal Medicine

## 2014-03-11 ENCOUNTER — Ambulatory Visit (INDEPENDENT_AMBULATORY_CARE_PROVIDER_SITE_OTHER): Payer: Medicare Other | Admitting: Internal Medicine

## 2014-03-11 VITALS — BP 134/78 | HR 64 | Temp 98.8°F | Ht 72.0 in | Wt 176.1 lb

## 2014-03-11 DIAGNOSIS — J310 Chronic rhinitis: Secondary | ICD-10-CM

## 2014-03-11 MED ORDER — AZITHROMYCIN 250 MG PO TABS: ORAL_TABLET | ORAL | Status: DC

## 2014-03-11 NOTE — Patient Instructions (Addendum)
Plain Mucinex (NOT D) for thick secretions ;force NON dairy fluids .   Nasal cleansing in the shower as discussed with lather of mild shampoo.After 10 seconds wash off lather while  exhaling through nostrils. Make sure that all residual soap is removed to prevent irritation.  Flonase OR Nasacort AQ 1 spray in each nostril twice a day as needed. Use the "crossover" technique into opposite nostril spraying toward opposite ear @ 45 degree angle, not straight up into nostril.  Plain Allegra (NOT D )  160 daily , Loratidine 10 mg , OR Zyrtec 10 mg @ bedtime  as needed for itchy eyes & sneezing. Zicam Melts or Zinc lozenges as per package label for sore throat . Complementary options include  vitamin C 2000 mg daily; & Echinacea for 4-7 days.   Report persistent or progressive fever; discolored nasal or chest secretions; or frontal headache or facial  pain.      Fill the  prescription for Zithromax it there is not dramatic improvement in the next 48 hours.

## 2014-03-11 NOTE — Progress Notes (Signed)
Pre visit review using our clinic review tool, if applicable. No additional management support is needed unless otherwise documented below in the visit note. 

## 2014-03-11 NOTE — Progress Notes (Signed)
   Subjective:    Patient ID: [name], male    DOB: 04-15-1926, 81 y.o.   MRN: [id number]  HPI He returns after having been seen earlier this week for upper respiratory tract symptoms, otitis medial with effusion and cerumen impaction. He has continued nasal saline but has significant symptoms.  At this time major symptom is congestion of the frontal sinus area "from temple to temple". He describes a sore throat; today he's had chills intermittently.He has had some intermittent substernal pain with inspiration. He has a chronic cough and will bring up brown sputum in the morning. He had scant yellow nasal discharge one occasion only.  Review of Systems He is not having fever or sweats. He is also not having otic pain or otic discharge. The cough is not associated with wheezing or shortness of breath.    Objective:   Physical Exam  The tympanic membranes are dull suggesting some serous otitis.  He has marked decreased range of motion of the cervical spine.  The uvula is edematous.  Heart sounds are distant; a grade 1/2 systolic murmur suggested.  He has insignificant rales with no significant abnormal breath sounds at this time.  General appearance:Adequately nourished; no acute distress or increased work of breathing is present.  No  lymphadenopathy about the head, neck, or axilla noted.  Eyes: No conjunctival inflammation or lid edema is present. There is no scleral icterus. Ears:  External ear exam shows no significant lesions or deformities.  Otoscopic examination reveals clear canals, tympanic membranes are intact bilaterally without bulging, retraction, inflammation or discharge. Nose:  External nasal examination shows no deformity or inflammation. Nasal mucosa are pink and moist without lesions or exudates. No septal dislocation or deviation.No obstruction to airflow.  Oral exam: Dental hygiene is good; lips and gums are healthy appearing.There is no oropharyngeal erythema or  exudate noted.  Neck:  No deformities, thyromegaly, masses, or tenderness noted.    Heart:  Normal rate and regular rhythm. S1 and S2 normal without gallop,  click, rub or other extra sounds.  Extremities:  No cyanosis, edema, or clubbing  noted  Skin: Warm & dry w/o jaundice or tenting.       Assessment & Plan:  #1 non allergic rhinitis See AVS

## 2014-03-15 ENCOUNTER — Encounter: Payer: Medicare Other | Admitting: Internal Medicine

## 2014-03-31 ENCOUNTER — Encounter: Payer: Self-pay | Admitting: Internal Medicine

## 2014-03-31 ENCOUNTER — Ambulatory Visit (INDEPENDENT_AMBULATORY_CARE_PROVIDER_SITE_OTHER): Payer: Medicare Other | Admitting: Internal Medicine

## 2014-03-31 VITALS — BP 140/78 | HR 64 | Ht 72.0 in | Wt 173.6 lb

## 2014-03-31 DIAGNOSIS — I25810 Atherosclerosis of coronary artery bypass graft(s) without angina pectoris: Secondary | ICD-10-CM | POA: Diagnosis not present

## 2014-03-31 DIAGNOSIS — I441 Atrioventricular block, second degree: Secondary | ICD-10-CM

## 2014-03-31 DIAGNOSIS — R001 Bradycardia, unspecified: Secondary | ICD-10-CM | POA: Diagnosis not present

## 2014-03-31 LAB — MDC_IDC_ENUM_SESS_TYPE_INCLINIC
Battery Voltage: 2.79 V
Brady Statistic AP VP Percent: 0 %
Brady Statistic AP VS Percent: 35 %
Brady Statistic AS VP Percent: 0 %
Brady Statistic AS VS Percent: 64 %
Date Time Interrogation Session: 20160323143153
Lead Channel Impedance Value: 460 Ohm
Lead Channel Impedance Value: 638 Ohm
Lead Channel Pacing Threshold Amplitude: 0.5 V
Lead Channel Pacing Threshold Pulse Width: 0.4 ms
Lead Channel Sensing Intrinsic Amplitude: 22.4 mV
Lead Channel Sensing Intrinsic Amplitude: 4 mV
Lead Channel Setting Pacing Amplitude: 2.5 V
Lead Channel Setting Sensing Sensitivity: 5.6 mV
MDC IDC MSMT BATTERY IMPEDANCE: 135 Ohm
MDC IDC MSMT BATTERY REMAINING LONGEVITY: 145 mo
MDC IDC MSMT LEADCHNL RV PACING THRESHOLD AMPLITUDE: 0.75 V
MDC IDC MSMT LEADCHNL RV PACING THRESHOLD PULSEWIDTH: 0.4 ms
MDC IDC SET LEADCHNL RA PACING AMPLITUDE: 2 V
MDC IDC SET LEADCHNL RV PACING PULSEWIDTH: 0.4 ms

## 2014-03-31 NOTE — Patient Instructions (Signed)
Your physician wants you to follow-up in: 12 months with Dr Vallery Ridge will receive a reminder letter in the mail two months in advance. If you don't receive a letter, please call our office to schedule the follow-up appointment.   Remote monitoring is used to monitor your Pacemaker of ICD from home. This monitoring reduces the number of office visits required to check your device to one time per year. It allows [place] to keep an eye on the functioning of your device to ensure it is working properly. You are scheduled for a device check from home on 06/30/14. You may send your transmission at any time that day. If you have a wireless device, the transmission will be sent automatically. After your physician reviews your transmission, you will receive a postcard with your next transmission date.

## 2014-03-31 NOTE — Progress Notes (Signed)
Electrophysiology Office Note   Date:  03/31/2014   ID:  [name], DOB 09/11/1926, MRN [id number]  PCP:  [name], MD  Cardiologist:  Dr Burt Knack Primary Electrophysiologist: Thompson Grayer, MD    Chief Complaint  Patient presents with  . Follow-up    2nd degree AV block     History of Present Illness: [name] is a 81 y.o. male who presents today for electrophysiology evaluation.   He is doing very well.  Remains active.  Continues to travel with his family and is planning to go [place] soon. Today, he denies symptoms of palpitations, chest pain, shortness of breath, orthopnea, PND, lower extremity edema, claudication, dizziness, presyncope, syncope, bleeding, or neurologic sequela. The patient is tolerating medications without difficulties and is otherwise without complaint today.    Past Medical History  Diagnosis Date  . Hypertension     well-controlled.  [name] CAD (coronary artery disease)     a. S/P stenting to mid RCA, prox PDA 06/1999. b. NSTEMI/CABG x 3 in 10/2011 with LIMA to LAD, SVG to PDA, and SVG to OM1.   . CKD (chronic kidney disease), stage III     a. stable with a creatinine around 1.9-2.0 followed by nephrology.  . Dyslipidemia   . Neck injury     a. C3-C4 and C4-C5 foraminal narrowing, severe  . Renal artery stenosis     a. 50% by cath 2001  . Pulmonary nodule     a. felt to be noncancerous.  Status post followup CT scan 4 mm and stable.  . Chronic UTI     a. Followed by Dr. Risa Grill - colonized/asymptomatic - not on abx  . Atrial fibrillation     a. Post-op from CABG, on amiodarone temporarily, d/c'd 12/2011  . Acute superficial venous thrombosis of lower extremity     a. RLE after CABG, neg dopp for DVT.  [name] Prostate cancer     a. 2001 s/p TURP.  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 S/p ptca    . Cardia catherization  07-07-99  .  Peripheral vascular catherization  11-24-03  . Renal circulation  10-01-03  . Stress cardiolite  05-04-05    spring 09-negative except for apical thinning, EF 68%  . Edg  07-17-1994  . Flexible sigmoidoscopy  11-03-1997  . Prostatectomy    . Lumbar spinal disk and neck fusion surgery    . Stents      X 2  . Coronary artery bypass graft  10/19/2011    Procedure: CORONARY ARTERY BYPASS GRAFTING (CABG);  Surgeon: Gaye Pollack, MD;  Location: [place];  Service: Open Heart Surgery;  Laterality: N/A;  times three using Left Internal Mammary Artery and Right Greater Saphenouse Vein Graft harvested Endoscopically  . Cardiac surgery  10/18/12    open heart surgery  . Colonoscopy  04/12/07  . Pacemaker insertion  03/28/12    PPM implanted for mobitz II AV block  . Cataract extraction w/ intraocular lens  implant, bilateral  3/205, 06/2013    [facility]  . Left heart catheterization with coronary angiogram N/A 10/16/2011    Procedure: LEFT HEART CATHETERIZATION WITH CORONARY ANGIOGRAM;  Surgeon: Peter M Martinique, MD;  Location: [facility] CATH LAB;  Service: Cardiovascular;  Laterality: N/A;  . Permanent pacemaker insertion N/A 03/28/2012    Procedure: PERMANENT PACEMAKER INSERTION;  Surgeon: Thompson Grayer,  MD;  Location: [facility] CATH LAB;  Service: Cardiovascular;  Laterality: N/A;  . Left heart catheterization with coronary/graft angiogram N/A 03/11/2013    Procedure: LEFT HEART CATHETERIZATION WITH [name];  Surgeon: Blane Ohara, MD;  Location: [facility] CATH LAB;  Service: Cardiovascular;  Laterality: N/A;     Current Outpatient Prescriptions  Medication Sig Dispense Refill  . aspirin 325 MG buffered tablet Take 325 mg by mouth daily.      . carboxymethylcellulose (REFRESH PLUS) 0.5 % SOLN Place 1 drop into both eyes daily as needed (dry eyes). 30 mL 1  . clotrimazole-betamethasone (LOTRISONE) cream Apply 1 application topically as needed. (Patient taking differently: Apply 1 application topically daily as  needed (itching). ) 45 g 3  . ergocalciferol (VITAMIN D2) 50000 UNITS capsule Take 50,000 Units by mouth every 30 (thirty) days. 15 th of each month    . fluticasone (FLONASE) 50 MCG/ACT nasal spray Place 1 spray into both nostrils daily as needed for allergies or rhinitis.    . folic acid (FOLVITE) 1 MG tablet Take 1 tablet (1 mg total) by mouth daily. 90 tablet 3  . metoprolol succinate (TOPROL-XL) 25 MG 24 hr tablet Take 1 tablet (25 mg total) by mouth 2 (two) times daily. 180 tablet 3  . nitroGLYCERIN (NITROSTAT) 0.4 MG SL tablet Place 1 tablet (0.4 mg total) under the tongue every 5 (five) minutes as needed for chest pain. (Patient taking differently: Place 0.4 mg under the tongue every 5 (five) minutes as needed for chest pain (MAX 3 TABLETS). ) 90 tablet 3  . potassium chloride (K-DUR) 10 MEQ tablet Take 1 tablet (10 mEq total) by mouth daily. 90 tablet 3  . simvastatin (ZOCOR) 20 MG tablet Take 1 tablet (20 mg total) by mouth at bedtime. 90 tablet 3  . vitamin B-12 (CYANOCOBALAMIN) 1000 MCG tablet Take 1,000 mcg by mouth daily.    . vitamin C (ASCORBIC ACID) 500 MG tablet Take 1,500 mg by mouth daily.     No current facility-administered medications for this visit.    Allergies:   Amoxicillin; Aspirin; Atarax; Ciprofloxacin; Codeine; Hydrocodone; Hydroxyzine; Macrobid; Niacin; Niacin-lovastatin er; Nitrofurantoin; Trimox; and Bactrim   Social History:  The patient  reports that he has never smoked. He has never used smokeless tobacco. He reports that he drinks alcohol. He reports that he does not use illicit drugs.   Family History:  The patient's family history includes Arthritis in his mother; Breast cancer in his other; Cancer in his brother; Coronary artery disease in his father; Heart disease in his brother; Nephritis in his brother; Other in his brother and mother; Prostate cancer in his brother. There is no history of Diabetes.    ROS:  Please see the history of present illness.    All other systems are reviewed and negative.    PHYSICAL EXAM: VS:  BP 140/78 mmHg  Pulse 64  Ht 6' (1.829 m)  Wt 173 lb 9.6 oz (78.744 kg)  BMI 23.54 kg/m2 , BMI Body mass index is 23.54 kg/(m^2). GEN: Well nourished, well developed, in no acute distress HEENT: normal Neck: no JVD, carotid bruits, or masses Cardiac: RRR; no murmurs, rubs, or gallops,no edema  Respiratory:  clear to auscultation bilaterally, normal work of breathing GI: soft, nontender, nondistended, + BS MS: no deformity or atrophy Skin: warm and dry, device pocket is well healed Neuro:  Strength and sensation are intact Psych: euthymic mood, full affect  Device interrogation is reviewed today in detail.  See PaceArt for details.   Recent Labs: 09/10/2013: BUN 30*; Creatinine 2.1*; Hemoglobin 13.6; Platelets 191; Potassium 5.3; Sodium 139 11/03/2013: ALT 13    Lipid Panel     Component Value Date/Time   CHOL 109 11/03/2013 0948   TRIG 71.0 11/03/2013 0948   TRIG 107 01/16/2006 1338   HDL 33.40* 11/03/2013 0948   CHOLHDL 3 11/03/2013 0948   CHOLHDL 3.6 CALC 01/16/2006 1338   VLDL 14.2 11/03/2013 0948   LDLCALC 61 11/03/2013 0948     Wt Readings from Last 3 Encounters:  03/31/14 173 lb 9.6 oz (78.744 kg)  03/11/14 176 lb 0.8 oz (79.856 kg)  03/08/14 175 lb (79.379 kg)      Other studies Reviewed: Additional studies/ records that were reviewed today include: Dr York Cerise notes   ASSESSMENT AND PLAN:  1. Mobitz II AV block Normal pacemaker function See Pace Art report  2. CAD Stable No change required today  3. HTN Stable No change required today  [facility] Return in 1 year to see me  Current medicines are reviewed at length with the patient today.   The patient does not have concerns regarding his medicines.  The following changes were made today:  none  Signed, Thompson Grayer, MD  03/31/2014 12:51 PM     [facility] [address] [place]  [phone] (office) [phone] (fax)

## 2014-05-12 ENCOUNTER — Encounter: Payer: Self-pay | Admitting: Cardiovascular Disease

## 2014-05-12 ENCOUNTER — Ambulatory Visit (INDEPENDENT_AMBULATORY_CARE_PROVIDER_SITE_OTHER): Payer: Medicare Other | Admitting: Cardiovascular Disease

## 2014-05-12 VITALS — BP 130/70 | HR 63 | Ht 72.0 in | Wt 175.1 lb

## 2014-05-12 DIAGNOSIS — I2510 Atherosclerotic heart disease of native coronary artery without angina pectoris: Secondary | ICD-10-CM | POA: Diagnosis not present

## 2014-05-12 DIAGNOSIS — I25810 Atherosclerosis of coronary artery bypass graft(s) without angina pectoris: Secondary | ICD-10-CM

## 2014-05-12 NOTE — Patient Instructions (Signed)
Medication Instructions:  Your physician recommends that you continue on your current medications as directed. Please refer to the Current Medication list given to you today.  Labwork: No new orders.   Testing/Procedures: No new orders.   Follow-Up: Your physician wants you to follow-up in: 1 YEAR with Dr Cooper.  You will receive a reminder letter in the mail two months in advance. If you don't receive a letter, please call our office to schedule the follow-up appointment.  Any Other Special Instructions Will Be Listed Below (If Applicable).   

## 2014-05-12 NOTE — Progress Notes (Signed)
Cardiology Office Note   Date:  05/12/2014   ID:  [name], DOB [birth year], MRN [id number]  PCP:  [name], MD  Cardiologist:  [name], MD    Chief Complaint  Patient presents with  . Follow-up    AV block, 6 mo     History of Present Illness: [name] is a 81 y.o. male who presents for evaluation.  He underwent multivessel CABG in 2013 after presenting with non-ST elevation MI. He had previously undergone coronary stenting dating back to 2001. He was found to have severe three-vessel obstructive disease. Left ventricular function was normal. He was bypassed with the LIMA to LAD, vein graft obtuse marginal, and vein graft to PDA. He also required permanent pacing in 2014 after presenting with second degree type II AV block.cardiac catheterization was performed in March 2015 after an abnormal nuclear scan.this demonstrated continued patency of the LIMA to LAD graft and early graft failure of the saphenous vein grafts. The patient had collateral filling of the RCA distribution and moderate stenosis of the native left circumflex. Medical therapy was recommended. The patient is followed for CKD by Dr Justin Mend. Was told renal function stable on recent labs.  He's exercising three days a week at the gym. He does light weights, rides a stationary bike, and walks on the treadmill. He feels well and has no chest pain or pressure, dyspnea, or exercise intolerance. Main complaint is insomnia. He wakes up in the middle of the night and has difficulty falling back asleep. He tries not to nap during the day. Typically goes to sleep at 11:30 pm.   Past Medical History  Diagnosis Date  . Hypertension     well-controlled.  [name] CAD (coronary artery disease)     a. S/P stenting to mid RCA, prox PDA 06/1999. b. NSTEMI/CABG x 3 in 10/2011 with LIMA to LAD, SVG to PDA, and SVG to OM1.   . CKD (chronic kidney disease), stage III     a. stable with a creatinine around 1.9-2.0  followed by nephrology.  . Dyslipidemia   . Neck injury     a. C3-C4 and C4-C5 foraminal narrowing, severe  . Renal artery stenosis     a. 50% by cath 2001  . Pulmonary nodule     a. felt to be noncancerous.  Status post followup CT scan 4 mm and stable.  . Chronic UTI     a. Followed by Dr. Risa Grill - colonized/asymptomatic - not on abx  . Atrial fibrillation     a. Post-op from CABG, on amiodarone temporarily, d/c'd 12/2011  . Acute superficial venous thrombosis of lower extremity     a. RLE after CABG, neg dopp for DVT.  [name] Prostate cancer     a. 2001 s/p TURP.  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 S/p ptca    . Cardia catherization  07-07-99  . Peripheral vascular catherization  11-24-03  . Renal circulation  10-01-03  . Stress cardiolite  05-04-05    spring 09-negative except for apical thinning, EF 68%  . Edg  07-17-1994  . Flexible sigmoidoscopy  11-03-1997  . Prostatectomy    . Lumbar spinal disk and neck fusion surgery    . Stents      X 2  . Coronary artery bypass graft  10/19/2011    Procedure: CORONARY ARTERY BYPASS  GRAFTING (CABG);  Surgeon: Gaye Pollack, MD;  Location: [facility];  Service: Open Heart Surgery;  Laterality: N/A;  times three using Left Internal Mammary Artery and Right Greater Saphenouse Vein Graft harvested Endoscopically  . Cardiac surgery  10/18/12    open heart surgery  . Colonoscopy  04/12/07  . Pacemaker insertion  03/28/12    PPM implanted for mobitz II AV block  . Cataract extraction w/ intraocular lens  implant, bilateral  3/205, 06/2013    [facility]  . Left heart catheterization with coronary angiogram N/A 10/16/2011    Procedure: LEFT HEART CATHETERIZATION WITH CORONARY ANGIOGRAM;  Surgeon: Peter M Martinique, MD;  Location: [facility] CATH LAB;  Service: Cardiovascular;  Laterality: N/A;  . Permanent pacemaker insertion N/A 03/28/2012    Procedure: PERMANENT  PACEMAKER INSERTION;  Surgeon: Thompson Grayer, MD;  Location: [facility] C[facility] LAB;  Service: Cardiovascular;  Laterality: N/A;  . Left heart catheterization with coronary/graft angiogram N/A 03/11/2013    Procedure: LEFT HEART CATHETERIZATION WITH [name];  Surgeon: Blane Ohara, MD;  Location: [facility] CATH LAB;  Service: Cardiovascular;  Laterality: N/A;    Current Outpatient Prescriptions  Medication Sig Dispense Refill  . aspirin 325 MG buffered tablet Take 325 mg by mouth daily.      . carboxymethylcellulose (REFRESH PLUS) 0.5 % SOLN Place 1 drop into both eyes daily as needed (dry eyes). 30 mL 1  . clotrimazole-betamethasone (LOTRISONE) cream Apply 1 application topically as needed. (Patient taking differently: Apply 1 application topically daily as needed (itching). ) 45 g 3  . ergocalciferol (VITAMIN D2) 50000 UNITS capsule Take 50,000 Units by mouth every 30 (thirty) days. 15 th of each month    . fluticasone (FLONASE) 50 MCG/ACT nasal spray Place 1 spray into both nostrils daily as needed for allergies or rhinitis.    . folic acid (FOLVITE) 1 MG tablet Take 1 tablet (1 mg total) by mouth daily. 90 tablet 3  . metoprolol succinate (TOPROL-XL) 25 MG 24 hr tablet Take 1 tablet (25 mg total) by mouth 2 (two) times daily. 180 tablet 3  . nitroGLYCERIN (NITROSTAT) 0.4 MG SL tablet Place 1 tablet (0.4 mg total) under the tongue every 5 (five) minutes as needed for chest pain. (Patient taking differently: Place 0.4 mg under the tongue every 5 (five) minutes as needed for chest pain (MAX 3 TABLETS). ) 90 tablet 3  . potassium chloride (K-DUR) 10 MEQ tablet Take 1 tablet (10 mEq total) by mouth daily. 90 tablet 3  . simvastatin (ZOCOR) 20 MG tablet Take 1 tablet (20 mg total) by mouth at bedtime. 90 tablet 3  . vitamin B-12 (CYANOCOBALAMIN) 1000 MCG tablet Take 1,000 mcg by mouth daily.    . vitamin C (ASCORBIC ACID) 500 MG tablet Take 1,500 mg by mouth daily.     No current  facility-administered medications for this visit.    Allergies:   Amoxicillin; Aspirin; Atarax; Ciprofloxacin; Codeine; Hydrocodone; Hydroxyzine; Macrobid; Niacin; Niacin-lovastatin er; Nitrofurantoin; Trimox; and Bactrim   Social History:  The patient  reports that he has never smoked. He has never used smokeless tobacco. He reports that he drinks alcohol. He reports that he does not use illicit drugs.   Family History:  The patient's  family history includes Arthritis in his mother; Breast cancer in his other; Cancer in his brother; Coronary artery disease in his father; Heart disease in his brother; Nephritis in his brother; Other in his brother and mother; Prostate cancer in his brother. There  is no history of Diabetes.    ROS:  Please see the history of present illness.  Otherwise, review of systems is positive for poor sleep.  All other systems are reviewed and negative.    PHYSICAL EXAM: VS:  BP 130/70 mmHg  Pulse 63  Ht 6' (1.829 m)  Wt 175 lb 1.9 oz (79.434 kg)  BMI 23.75 kg/m2 , BMI Body mass index is 23.75 kg/(m^2). GEN: Well nourished, well developed, in no acute distress HEENT: normal Neck: no JVD, no masses. No carotid bruits Cardiac: RRR without murmur or gallop                Respiratory:  clear to auscultation bilaterally, normal work of breathing GI: soft, nontender, nondistended, + BS MS: no deformity or atrophy Ext: no pretibial edema, pedal pulses 2+= bilaterally Skin: warm and dry, no rash Neuro:  Strength and sensation are intact Psych: euthymic mood, full affect  EKG:  EKG is ordered today. The ekg ordered today shows NSR with possible age-indeterminate inferior infarct, otherwise within normal limits.  Recent Labs: 09/10/2013: BUN 30*; Creatinine 2.1*; Hemoglobin 13.6; Platelets 191; Potassium 5.3; Sodium 139 11/03/2013: ALT 13   Lipid Panel     Component Value Date/Time   CHOL 109 11/03/2013 0948   TRIG 71.0 11/03/2013 0948   TRIG 107 01/16/2006 1338    HDL 33.40* 11/03/2013 0948   CHOLHDL 3 11/03/2013 0948   CHOLHDL 3.6 CALC 01/16/2006 1338   VLDL 14.2 11/03/2013 0948   LDLCALC 61 11/03/2013 0948      Wt Readings from Last 3 Encounters:  05/12/14 175 lb 1.9 oz (79.434 kg)  03/31/14 173 lb 9.6 oz (78.744 kg)  03/11/14 176 lb 0.8 oz (79.856 kg)     Cardiac Studies Reviewed: 2-D echocardiogram 10/16/2011: Study Conclusions  - Left ventricle: The cavity size was normal. Wall thickness was normal. Systolic function was normal. The estimated ejection fraction was in the range of 50% to 55%. There was an increased relative contribution of atrial contraction to ventricular filling. - Left atrium: The atrium was mildly dilated.  Cardiac Cath 3.4.2016: Procedural Findings: Hemodynamics: AO 160/87 LV 160/20  Coronary angiography: Coronary dominance: right  Left mainstem: The left main arises from the left coronary cusp. There is no significant disease noted. The vessel trifurcates into the LAD, intermediate branch, and left circumflex.  Left anterior descending (LAD): The proximal LAD is patent. The first diagonal is patent. The mid LAD has diffuse 50-70% stenosis. The distal vessel fills competitively from the mammary artery.  Left circumflex (LCx): The left circumflex system is patent. There is an intermediate branch with mild 30-40% stenosis. The AV circumflex has 50-60% proximal stenosis. The obtuse marginal branches are patent without significant disease.  Right coronary artery (RCA): The vessel is moderately calcified. The mid vessel is totally occluded. There is a proximal stent that is widely patent. The vessel is occluded after the stent. The PDA fills from left to right collaterals.  Left ventriculography: Deferred  LIMA to LAD: Widely patent with no obstructive disease. There is competitive native vessel and graft flow filling the distal LAD.  Saphenous vein graft to PDA: Total occlusion of the proximal  aortic anastomosis  Saphenous vein graft obtuse marginal: Total occlusion of the proximal aortic anastomosis  Final Conclusions:  1. 3 vessel coronary artery disease with total occlusion of the RCA, moderate stenosis of the LAD, and moderate stenosis of the native left circumflex  2. Status post aortocoronary bypass surgery with  continued patency of the LIMA to LAD, total occlusion of the vein graft to PDA, and total occlusion of the vein graft obtuse marginal.  Recommendations: The patient has moderate nonobstructive disease of the left circumflex. His LAD is grafted with a widely patent LIMA. The circumflex is now chronically occluded with left to right collaterals. With primarily atypical chest pain symptoms, I would favor ongoing medical therapy.  ASSESSMENT AND PLAN: 1.  CAD, native vessel and bypass graft disease: no symptoms of angina. Reviewed his medical program and no changes were recommended. The patient is on aspirin, metoprolol succinate, and a statin drug.  2. Hyperlipidemia: lipids reviewed as above. They have been at goal. He is tolerating simvastatin at low dose.  3. S/P PPM: followed by Dr Rayann Heman. Hx Second degree AV block.  Current medicines are reviewed with the patient today.  The patient does not have concerns regarding medicines.  Labs/ tests ordered today include:   Orders Placed This Encounter  Procedures  . EKG 12-Lead   Disposition:   FU one year  Signed, [name], MD  05/12/2014 12:46 PM    [facility] [place], [place], [place]  [address] Phone: [phone]; Fax: [phone]

## 2014-06-30 ENCOUNTER — Ambulatory Visit (INDEPENDENT_AMBULATORY_CARE_PROVIDER_SITE_OTHER): Payer: Medicare Other | Admitting: *Deleted

## 2014-06-30 DIAGNOSIS — I441 Atrioventricular block, second degree: Secondary | ICD-10-CM

## 2014-06-30 NOTE — Progress Notes (Signed)
Remote pacemaker transmission.   

## 2014-07-04 LAB — CUP PACEART REMOTE DEVICE CHECK
Battery Impedance: 160 Ohm
Battery Remaining Longevity: 138 mo
Battery Voltage: 2.79 V
Brady Statistic AP VS Percent: 43 %
Brady Statistic AS VP Percent: 0 %
Date Time Interrogation Session: 20160622115140
Lead Channel Impedance Value: 452 Ohm
Lead Channel Pacing Threshold Amplitude: 0.5 V
Lead Channel Pacing Threshold Pulse Width: 0.4 ms
Lead Channel Pacing Threshold Pulse Width: 0.4 ms
Lead Channel Sensing Intrinsic Amplitude: 16 mV
Lead Channel Sensing Intrinsic Amplitude: 2.8 mV
Lead Channel Setting Pacing Amplitude: 2.5 V
MDC IDC MSMT LEADCHNL RV IMPEDANCE VALUE: 638 Ohm
MDC IDC MSMT LEADCHNL RV PACING THRESHOLD AMPLITUDE: 0.75 V
MDC IDC SET LEADCHNL RA PACING AMPLITUDE: 2 V
MDC IDC SET LEADCHNL RV PACING PULSEWIDTH: 0.4 ms
MDC IDC SET LEADCHNL RV SENSING SENSITIVITY: 5.6 mV
MDC IDC STAT BRADY AP VP PERCENT: 0 %
MDC IDC STAT BRADY AS VS PERCENT: 57 %

## 2014-07-19 ENCOUNTER — Encounter: Payer: Self-pay | Admitting: Internal Medicine

## 2014-07-21 ENCOUNTER — Encounter: Payer: Self-pay | Admitting: Internal Medicine

## 2014-07-21 ENCOUNTER — Other Ambulatory Visit (INDEPENDENT_AMBULATORY_CARE_PROVIDER_SITE_OTHER): Payer: Medicare Other

## 2014-07-21 ENCOUNTER — Ambulatory Visit (INDEPENDENT_AMBULATORY_CARE_PROVIDER_SITE_OTHER): Payer: Medicare Other | Admitting: Internal Medicine

## 2014-07-21 VITALS — BP 122/62 | HR 62 | Temp 98.6°F | Resp 16 | Wt 171.0 lb

## 2014-07-21 DIAGNOSIS — I25810 Atherosclerosis of coronary artery bypass graft(s) without angina pectoris: Secondary | ICD-10-CM

## 2014-07-21 DIAGNOSIS — R202 Paresthesia of skin: Secondary | ICD-10-CM

## 2014-07-21 DIAGNOSIS — I872 Venous insufficiency (chronic) (peripheral): Secondary | ICD-10-CM

## 2014-07-21 DIAGNOSIS — R194 Change in bowel habit: Secondary | ICD-10-CM | POA: Diagnosis not present

## 2014-07-21 DIAGNOSIS — M5010 Cervical disc disorder with radiculopathy, unspecified cervical region: Secondary | ICD-10-CM | POA: Diagnosis not present

## 2014-07-21 DIAGNOSIS — R198 Other specified symptoms and signs involving the digestive system and abdomen: Secondary | ICD-10-CM

## 2014-07-21 NOTE — Progress Notes (Signed)
   Subjective:    Patient ID: [name], male    DOB: 03-03-1926, 81 y.o.   MRN: [id number]  HPI He has several concerns. Either 7/7 or 7/8 he had change in his bowels after eating at a [employer]. Since that time the stools have been small and soft. She will have this phenomena 1-3 times per day. He has not taken any medicine as per the instructions of his kidney doctor.  He denies other GI symptoms.  His last colonoscopy was 2009.Benign , non adenomatous polypoid mucosa was found.  Additionally he has numbness of both feet with some tingling. This phenomenon has been present since 10/19/11 at the time of a triple bypass. He believes it is progressive. This is worse after using his treadmill. He has no other cardiovascular symptoms.  Additionally he has sharp pains behind the ear and down the neck a couple of times a month. He questioned whether this might be related to wax impactions. He has a past history of cervical fusion.   Review of Systems Unexplained weight loss, abdominal pain, significant dyspepsia, dysphagia, melena, rectal bleeding, or persistently small caliber stools are denied.  He denies numbness, tingling, weakness, or radicular pain down the arms. He has no loss of control of bladder or bowels.  He is not having fever, chills, sweats, weight loss.     Objective:   Physical Exam Pertinent or positive findings include: There is minimal wax in otic canals. He has significantly reduced range of motion of the neck particularly with left lateral rotation. Varicosities and venous spiders are present over the lower extremities. With the legs dependent he has marked rubor. This improves dramatically with elevation of the feet.   General appearance :adequately nourished; in no distress.  Eyes: No conjunctival inflammation or scleral icterus is present.  Oral exam:  Lips and gums are healthy appearing.There is no oropharyngeal erythema or exudate noted. Dental  hygiene is good.  Heart:  Normal rate and regular rhythm. S1 and S2 normal without gallop, murmur, click, rub or other extra sounds    Lungs:Chest clear to auscultation; no wheezes, rhonchi,rales ,or rubs present.No increased work of breathing.   Abdomen: bowel sounds normal, soft and non-tender without masses, organomegaly or hernias noted.  No guarding or rebound.  Vascular : all pulses equal ; no bruits present.  Skin:Warm & dry.  Intact without suspicious lesions or rashes ; no tenting or jaundice   Lymphatic: No lymphadenopathy is noted about the head, neck, axilla  Neuro: Strength, tone & DTRs normal.        Assessment & Plan:  #1 changes in bowel. Consider MiraLAX  every third day until bowels are normal  #2 paresthesias of the distal feet  #3 venous insufficiency with dependent rubor  #4 intermittent cervical radiculopathy without significant radiation of the pain  Plan: See orders recommendations

## 2014-07-21 NOTE — Patient Instructions (Signed)
  Your next office appointment will be determined based upon review of your pending labs  Those written interpretation of the lab results and instructions will be transmitted to you by My Chart    Critical results will be called.   Followup as needed for any active or acute issue. Please report any significant change in your symptoms.  Wear over the calf support hose if you are standing for prolonged periods of  time or working on your feet for prolonged periods of time.

## 2014-07-21 NOTE — Progress Notes (Signed)
Pre visit review using our clinic review tool, if applicable. No additional management support is needed unless otherwise documented below in the visit note. 

## 2014-07-22 LAB — VITAMIN B12: Vitamin B-12: 1472 pg/mL — ABNORMAL HIGH (ref 211–911)

## 2014-07-27 ENCOUNTER — Telehealth: Payer: Self-pay | Admitting: Internal Medicine

## 2014-07-27 NOTE — Telephone Encounter (Signed)
Follow Up.   4. Are you calling to see if we received your device transmission? Y  Pt called states that he received a call via [place]. Pt states that it wasn't clear. Requests a call back to discuss the readings. Please call

## 2014-07-28 NOTE — Telephone Encounter (Signed)
Spoke with patient- explained device interrogation report. Confirmed next remote transmission is 10-04-14. Patient verbalized understanding.

## 2014-08-31 ENCOUNTER — Other Ambulatory Visit (INDEPENDENT_AMBULATORY_CARE_PROVIDER_SITE_OTHER): Payer: Medicare Other

## 2014-08-31 ENCOUNTER — Encounter: Payer: Self-pay | Admitting: Internal Medicine

## 2014-08-31 ENCOUNTER — Ambulatory Visit (INDEPENDENT_AMBULATORY_CARE_PROVIDER_SITE_OTHER): Payer: Medicare Other | Admitting: Internal Medicine

## 2014-08-31 VITALS — BP 130/66 | HR 60 | Temp 97.9°F | Ht 72.0 in | Wt 170.2 lb

## 2014-08-31 DIAGNOSIS — I10 Essential (primary) hypertension: Secondary | ICD-10-CM

## 2014-08-31 DIAGNOSIS — N183 Chronic kidney disease, stage 3 unspecified: Secondary | ICD-10-CM

## 2014-08-31 DIAGNOSIS — Z23 Encounter for immunization: Secondary | ICD-10-CM | POA: Diagnosis not present

## 2014-08-31 DIAGNOSIS — G629 Polyneuropathy, unspecified: Secondary | ICD-10-CM | POA: Diagnosis not present

## 2014-08-31 DIAGNOSIS — E785 Hyperlipidemia, unspecified: Secondary | ICD-10-CM | POA: Diagnosis not present

## 2014-08-31 DIAGNOSIS — I25810 Atherosclerosis of coronary artery bypass graft(s) without angina pectoris: Secondary | ICD-10-CM | POA: Diagnosis not present

## 2014-08-31 DIAGNOSIS — Z0000 Encounter for general adult medical examination without abnormal findings: Secondary | ICD-10-CM

## 2014-08-31 LAB — HEPATIC FUNCTION PANEL
ALT: 15 U/L (ref 0–53)
AST: 18 U/L (ref 0–37)
Albumin: 4.4 g/dL (ref 3.5–5.2)
Alkaline Phosphatase: 54 U/L (ref 39–117)
BILIRUBIN DIRECT: 0.2 mg/dL (ref 0.0–0.3)
TOTAL PROTEIN: 7.3 g/dL (ref 6.0–8.3)
Total Bilirubin: 0.9 mg/dL (ref 0.2–1.2)

## 2014-08-31 LAB — CBC WITH DIFFERENTIAL/PLATELET
BASOS PCT: 0.5 % (ref 0.0–3.0)
Basophils Absolute: 0 10*3/uL (ref 0.0–0.1)
EOS ABS: 0.1 10*3/uL (ref 0.0–0.7)
EOS PCT: 1.7 % (ref 0.0–5.0)
HEMATOCRIT: 43.6 % (ref 39.0–52.0)
HEMOGLOBIN: 13.8 g/dL (ref 13.0–17.0)
LYMPHS PCT: 19 % (ref 12.0–46.0)
Lymphs Abs: 1.3 10*3/uL (ref 0.7–4.0)
MCHC: 31.7 g/dL (ref 30.0–36.0)
MCV: 78.9 fl (ref 78.0–100.0)
Monocytes Absolute: 0.5 10*3/uL (ref 0.1–1.0)
Monocytes Relative: 8 % (ref 3.0–12.0)
Neutro Abs: 4.7 10*3/uL (ref 1.4–7.7)
Neutrophils Relative %: 70.8 % (ref 43.0–77.0)
Platelets: 189 10*3/uL (ref 150.0–400.0)
RBC: 5.52 Mil/uL (ref 4.22–5.81)
RDW: 15.4 % (ref 11.5–15.5)
WBC: 6.6 10*3/uL (ref 4.0–10.5)

## 2014-08-31 LAB — BASIC METABOLIC PANEL
BUN: 33 mg/dL — ABNORMAL HIGH (ref 6–23)
CHLORIDE: 105 meq/L (ref 96–112)
CO2: 28 mEq/L (ref 19–32)
CREATININE: 2.21 mg/dL — AB (ref 0.40–1.50)
Calcium: 9.6 mg/dL (ref 8.4–10.5)
GFR: 30.15 mL/min — ABNORMAL LOW (ref >60.00)
Glucose, Bld: 88 mg/dL (ref 70–99)
POTASSIUM: 5.1 meq/L (ref 3.5–5.1)
SODIUM: 137 meq/L (ref 135–145)

## 2014-08-31 LAB — LIPID PANEL
CHOL/HDL RATIO: 3
Cholesterol: 108 mg/dL (ref 0–200)
HDL: 32.7 mg/dL — ABNORMAL LOW (ref >39.00)
LDL Cholesterol: 56 mg/dL (ref 0–99)
NONHDL: 75.46
TRIGLYCERIDES: 99 mg/dL (ref 0.0–149.0)
VLDL: 19.8 mg/dL (ref 0.0–40.0)

## 2014-08-31 LAB — TSH: TSH: 2.62 u[IU]/mL (ref 0.35–4.50)

## 2014-08-31 MED ORDER — NITROGLYCERIN 0.4 MG SL SUBL: 0.4000 mg | SUBLINGUAL_TABLET | SUBLINGUAL | Status: DC | PRN

## 2014-08-31 MED ORDER — CLOTRIMAZOLE-BETAMETHASONE 1-0.05 % EX CREA: 1.0000 "application " | TOPICAL_CREAM | Freq: Every day | CUTANEOUS | Status: DC | PRN

## 2014-08-31 MED ORDER — DIPHENHYDRAMINE HCL 12.5 MG/5ML PO SYRP
12.5000 mg | ORAL_SOLUTION | Freq: Every evening | ORAL | Status: DC | PRN
Start: 2014-08-31 — End: 2015-03-01

## 2014-08-31 NOTE — Progress Notes (Signed)
Subjective:    Patient ID: [name], male    DOB: 03-Aug-1926, 81 y.o.   MRN: [id number]  HPI   Here for medicare wellness  Diet: heart healthy  Physical activity: sedentary Depression/mood screen: negative Hearing: intact to whispered voice Visual acuity: grossly normal, performs annual eye exam  ADLs: capable Fall risk: none Home safety: good Cognitive evaluation: intact to orientation, naming, recall and repetition EOL planning: adv directives, full code/ I agree  I have personally reviewed and have noted 1. The patient's medical and social history 2. Their use of alcohol, tobacco or illicit drugs 3. Their current medications and supplements 4. The patient's functional ability including ADL's, fall risks, home safety risks and hearing or visual impairment. 5. Diet and physical activities 6. Evidence for depression or mood disorders  Also reviewed chronic medical conditions, interval events and current concerns  Past Medical History  Diagnosis Date  . Hypertension     well-controlled.  [name] CAD (coronary artery disease)     a. S/P stenting to mid RCA, prox PDA 06/1999. b. NSTEMI/CABG x 3 in 10/2011 with LIMA to LAD, SVG to PDA, and SVG to OM1.   . CKD (chronic kidney disease), stage III     a. stable with a creatinine around 1.9-2.0 followed by nephrology.  . Dyslipidemia   . Neck injury     a. C3-C4 and C4-C5 foraminal narrowing, severe  . Renal artery stenosis     a. 50% by cath 2001  . Pulmonary nodule     a. felt to be noncancerous.  Status post followup CT scan 4 mm and stable.  . Chronic UTI     a. Followed by Dr. Risa Grill - colonized/asymptomatic - not on abx  . Atrial fibrillation     a. Post-op from CABG, on amiodarone temporarily, d/c'd 12/2011  . Acute superficial venous thrombosis of lower extremity     a. RLE after CABG, neg dopp for DVT.  [name] Prostate cancer     a. 2001 s/p TURP.  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 Coronary artery disease Father     died @ 7  . Cancer Brother     Bladder  . Diabetes Neg Hx   . Prostate cancer Brother   . Nephritis Brother     died @ age 6.  . Other Brother     cerebral hemorrhage - died @ 33  . Other Mother     cerebral hemorrhage - died @ 54  . Heart disease Brother   . Arthritis Mother   . Breast cancer Other     niece x 2   Social History  Substance Use Topics  . Smoking status: Never Smoker   . Smokeless tobacco: Never Used  . Alcohol Use: Yes     Comment: Rarely    Review of Systems  Constitutional: Negative for fever, activity change, appetite change, fatigue and unexpected weight change.  Respiratory: Negative for cough, chest tightness, shortness of breath and wheezing.   Cardiovascular: Negative for chest pain, palpitations and leg swelling.  Neurological: Positive for numbness (R foot With prolonged standing or prolonged sitting). Negative for dizziness, weakness and headaches.  Psychiatric/Behavioral: Positive for sleep disturbance (hronic insomnia). Negative for dysphoric mood. The patient is not nervous/anxious.   All other systems reviewed and are negative.  Patient Care Team: Rowe Clack,  MD as PCP - General (Internal Medicine) Rana Snare, MD (Urology) Edrick Oh, MD (Nephrology) Allyn Kenner, MD (Dermatology) Luberta Mutter, MD (Ophthalmology) Latanya Maudlin, MD (Orthopedic Surgery) [name], MD (Cardiology) Inda Castle, MD (Gastroenterology)     Objective:    Physical Exam  Constitutional: He appears well-developed and well-nourished. No distress.  Cardiovascular: Normal rate, regular rhythm and normal heart sounds.   No murmur heard. Pulmonary/Chest: Effort normal and breath sounds normal. No respiratory distress.    BP 130/66 mmHg  Pulse 60  Temp(Src) 97.9 F (36.6 C) (Oral)  Ht 6' (1.829 m)  Wt 170 lb 4 oz (77.225 kg)  BMI  23.09 kg/m2  SpO2 95% Wt Readings from Last 3 Encounters:  08/31/14 170 lb 4 oz (77.225 kg)  07/21/14 171 lb (77.565 kg)  05/12/14 175 lb 1.9 oz (79.434 kg)     Lab Results  Component Value Date   WBC 5.6 09/10/2013   HGB 13.6 09/10/2013   HCT 40* 09/10/2013   PLT 191 09/10/2013   GLUCOSE 87 03/04/2013   CHOL 109 11/03/2013   TRIG 71.0 11/03/2013   HDL 33.40* 11/03/2013   LDLCALC 61 11/03/2013   ALT 13 11/03/2013   AST 16 11/03/2013   NA 139 09/10/2013   K 5.3 09/10/2013   CL 107 03/04/2013   CREATININE 2.1* 09/10/2013   BUN 30* 09/10/2013   CO2 25 03/04/2013   TSH 2.074 03/26/2012   PSA 0.44 07/18/2007   INR 1.2* 03/04/2013   Lab Results  Component Value Date   VITAMINB12 1472* 07/21/2014     Dg Cervical Spine 2 Or 3 Views  03/02/2014   CLINICAL DATA:  History of fusion in 1987, now making popping noise with flexion and extension, no injury  EXAM: CERVICAL SPINE - 2-3 VIEW  COMPARISON:  Cervical spine films of 09/02/2008  FINDINGS: Cervical vertebrae are straightened in alignment. Solid fusion again is noted at C6-7. Degenerative disc disease also is noted diffusely throughout the cervical spine but greatest at C3-4, C4-5 and C5-6 levels. C7-T1 level is not well seen. No prevertebral soft tissue swelling is noted. The odontoid process is not optimally visualized but grossly appears intact, and the lung apices are clear.  IMPRESSION: 1. Straightened alignment with solid fusion at C6-7. 2. Degenerative disc disease at C3-4, C4-5, and C5-6. No acute abnormality.   Electronically Signed   By: Ivar Drape M.D.   On: 03/02/2014 16:51       Assessment & Plan:   AWV/z00.00 - Today patient counseled on age appropriate routine health concerns for screening and prevention, each reviewed and up to date or declined. Immunizations reviewed and up to date or declined. Labs ordered and reviewed. Risk factors for depression reviewed and negative. Hearing function and visual acuity are  intact. ADLs screened and addressed as needed. Functional ability and level of safety reviewed and appropriate. Education, counseling and referrals performed based on assessed risks today. Patient provided with a copy of personalized plan for preventive services.  Problem List Items Addressed This Visit    Chronic renal impairment, stage 3 (moderate) (Chronic)    follows with nephrology, reports recent function stable Carefully monitor medications to avoid nephrotoxic agents. Okay for over-the-counter Benadryl as needed to assist insomnia      Relevant Orders   Basic metabolic panel   Dyslipidemia (Chronic)    On simva for years.  Believes statin may be contributing to insomnia to take daily at bedtime so okay to take once  daily in morning or lunch Check lipids annually and titrate as needed      Relevant Medications   simvastatin (ZOCOR) 20 MG tablet   Other Relevant Orders   Lipid panel   Essential hypertension (Chronic)    BP Readings from Last 3 Encounters:  08/31/14 130/66  07/21/14 122/62  05/12/14 130/70   The current medical regimen is effective;  continue present plan and medications.       Relevant Medications   nitroGLYCERIN (NITROSTAT) 0.4 MG SL tablet   simvastatin (ZOCOR) 20 MG tablet   Other Relevant Orders   Basic metabolic panel   Lipid panel   Peripheral neuropathy    Increasing symptoms of numbness in right leg with prolonged sitting or standing istory of prior back surgery reviewed At this time, patient reports symptoms not severe enough to warrant further evaluation with imaging to consider stereo injection Patient will notify us if progressive or worsening symp      Relevant Orders   Basic metabolic panel   CBC with Differential/Platelet   Hepatic function panel   TSH    Other Visit Diagnoses    Routine general medical examination at a health care facility    -  Primary        [name], MD

## 2014-08-31 NOTE — Progress Notes (Signed)
Pre visit review using our clinic review tool, if applicable. No additional management support is needed unless otherwise documented below in the visit note. 

## 2014-08-31 NOTE — Assessment & Plan Note (Signed)
Increasing symptoms of numbness in right leg with prolonged sitting or standing istory of prior back surgery reviewed At this time, patient reports symptoms not severe enough to warrant further evaluation with imaging to consider stereo injection Patient will notify us if progressive or worsening symp

## 2014-08-31 NOTE — Assessment & Plan Note (Signed)
BP Readings from Last 3 Encounters:  08/31/14 130/66  07/21/14 122/62  05/12/14 130/70   The current medical regimen is effective;  continue present plan and medications.

## 2014-08-31 NOTE — Patient Instructions (Addendum)
It was good to see you today.  We have reviewed your prior records including labs and tests today  Health Maintenance reviewed - flu shot and Pneumovax both updated today. All other recommended immunizations and age-appropriate screenings are up-to-date.  Test(s) ordered today. Your results will be released to [place] (or called to you) after review, usually within 72hours after test completion. If any changes need to be made, you will be notified at that same time.  Medications reviewed and updated Okay to try low-dose generic Benadryl 6.25 mg to 12.5 mg nightly as needed for insomnia Also change timing of simvastatin to daytime and take second metoprolol dose earlier No other dose changes recommended at this time.  Please schedule followup in 12 months for annual exam and labs, call sooner if problems.  Health Maintenance A healthy lifestyle and preventative care can promote health and wellness.  Maintain regular health, dental, and eye exams.  Eat a healthy diet. Foods like vegetables, fruits, whole grains, low-fat dairy products, and lean protein foods contain the nutrients you need and are low in calories. Decrease your intake of foods high in solid fats, added sugars, and salt. Get information about a proper diet from your health care provider, if necessary.  Regular physical exercise is one of the most important things you can do for your health. Most adults should get at least 150 minutes of moderate-intensity exercise (any activity that increases your heart rate and causes you to sweat) each week. In addition, most adults need muscle-strengthening exercises on 2 or more days a week.   Maintain a healthy weight. The body mass index (BMI) is a screening tool to identify possible weight problems. It provides an estimate of body fat based on height and weight. Your health care provider can find your BMI and can help you achieve or maintain a healthy weight. For males 20 years and  older:  A BMI below 18.5 is considered underweight.  A BMI of 18.5 to 24.9 is normal.  A BMI of 25 to 29.9 is considered overweight.  A BMI of 30 and above is considered obese.  Maintain normal blood lipids and cholesterol by exercising and minimizing your intake of saturated fat. Eat a balanced diet with plenty of fruits and vegetables. Blood tests for lipids and cholesterol should begin at age 23 and be repeated every 5 years. If your lipid or cholesterol levels are high, you are over age 79, or you are at high risk for heart disease, you may need your cholesterol levels checked more frequently.Ongoing high lipid and cholesterol levels should be treated with medicines if diet and exercise are not working.  If you smoke, find out from your health care provider how to quit. If you do not use tobacco, do not start.  Lung cancer screening is recommended for adults aged 47-80 years who are at high risk for developing lung cancer because of a history of smoking. A yearly low-dose CT scan of the lungs is recommended for people who have at least a 30-pack-year history of smoking and are current smokers or have quit within the past 15 years. A pack year of smoking is smoking an average of 1 pack of cigarettes a day for 1 year (for example, a 30-pack-year history of smoking could mean smoking 1 pack a day for 30 years or 2 packs a day for 15 years). Yearly screening should continue until the smoker has stopped smoking for at least 15 years. Yearly screening should be stopped for  people who develop a health problem that would prevent them from having lung cancer treatment.  If you choose to drink alcohol, do not have more than 2 drinks per day. One drink is considered to be 12 oz (360 mL) of beer, 5 oz (150 mL) of wine, or 1.5 oz (45 mL) of liquor.  Avoid the use of street drugs. Do not share needles with anyone. Ask for help if you need support or instructions about stopping the use of drugs.  High  blood pressure causes heart disease and increases the risk of stroke. Blood pressure should be checked at least every 1-2 years. Ongoing high blood pressure should be treated with medicines if weight loss and exercise are not effective.  If you are 36-40 years old, ask your health care provider if you should take aspirin to prevent heart disease.  Diabetes screening involves taking a blood sample to check your fasting blood sugar level. This should be done once every 3 years after age 46 if you are at a normal weight and without risk factors for diabetes. Testing should be considered at a younger age or be carried out more frequently if you are overweight and have at least 1 risk factor for diabetes.  Colorectal cancer can be detected and often prevented. Most routine colorectal cancer screening begins at the age of 25 and continues through age 75. However, your health care provider may recommend screening at an earlier age if you have risk factors for colon cancer. On a yearly basis, your health care provider may provide home test kits to check for hidden blood in the stool. A small camera at the end of a tube may be used to directly examine the colon (sigmoidoscopy or colonoscopy) to detect the earliest forms of colorectal cancer. Talk to your health care provider about this at age 36 when routine screening begins. A direct exam of the colon should be repeated every 5-10 years through age 26, unless early forms of precancerous polyps or small growths are found.  People who are at an increased risk for hepatitis B should be screened for this virus. You are considered at high risk for hepatitis B if:  You were born in a country where hepatitis B occurs often. Talk with your health care provider about which countries are considered high risk.  Your parents were born in a high-risk country and you have not received a shot to protect against hepatitis B (hepatitis B vaccine).  You have HIV or AIDS.  You  use needles to inject street drugs.  You live with, or have sex with, someone who has hepatitis B.  You are a man who has sex with other men (MSM).  You get hemodialysis treatment.  You take certain medicines for conditions like cancer, organ transplantation, and autoimmune conditions.  Hepatitis C blood testing is recommended for all people born from [birth year] through 1963 and any individual with known risk factors for hepatitis C.  Healthy men should no longer receive prostate-specific antigen (PSA) blood tests as part of routine cancer screening. Talk to your health care provider about prostate cancer screening.  Testicular cancer screening is not recommended for adolescents or adult males who have no symptoms. Screening includes self-exam, a health care provider exam, and other screening tests. Consult with your health care provider about any symptoms you have or any concerns you have about testicular cancer.  Practice safe sex. Use condoms and avoid high-risk sexual practices to reduce the spread of sexually transmitted  infections (STIs).  You should be screened for STIs, including gonorrhea and chlamydia if:  You are sexually active and are younger than 24 years.  You are older than 24 years, and your health care provider tells you that you are at risk for this type of infection.  Your sexual activity has changed since you were last screened, and you are at an increased risk for chlamydia or gonorrhea. Ask your health care provider if you are at risk.  If you are at risk of being infected with HIV, it is recommended that you take a prescription medicine daily to prevent HIV infection. This is called pre-exposure prophylaxis (PrEP). You are considered at risk if:  You are a man who has sex with other men (MSM).  You are a heterosexual man who is sexually active with multiple partners.  You take drugs by injection.  You are sexually active with a partner who has HIV.  Talk with  your health care provider about whether you are at high risk of being infected with HIV. If you choose to begin PrEP, you should first be tested for HIV. You should then be tested every 3 months for as long as you are taking PrEP.  Use sunscreen. Apply sunscreen liberally and repeatedly throughout the day. You should seek shade when your shadow is shorter than you. Protect yourself by wearing long sleeves, pants, a wide-brimmed hat, and sunglasses year round whenever you are outdoors.  Tell your health care provider of new moles or changes in moles, especially if there is a change in shape or color. Also, tell your health care provider if a mole is larger than the size of a pencil eraser.  A one-time screening for abdominal aortic aneurysm (AAA) and surgical repair of large AAAs by ultrasound is recommended for men aged 87-75 years who are current or former smokers.  Stay current with your vaccines (immunizations). Document Released: 06/23/2007 Document Revised: 12/30/2012 Document Reviewed: 05/22/2010 [facility] Patient Information 2015 [place], [place]. This information is not intended to replace advice given to you by your health care provider. Make sure you discuss any questions you have with your health care provider.

## 2014-08-31 NOTE — Assessment & Plan Note (Signed)
On simva for years.  Believes statin may be contributing to insomnia to take daily at bedtime so okay to take once daily in morning or lunch Check lipids annually and titrate as needed

## 2014-08-31 NOTE — Assessment & Plan Note (Signed)
follows with nephrology, reports recent function stable Carefully monitor medications to avoid nephrotoxic agents. Okay for over-the-counter Benadryl as needed to assist insomnia

## 2014-10-04 ENCOUNTER — Ambulatory Visit (INDEPENDENT_AMBULATORY_CARE_PROVIDER_SITE_OTHER): Payer: Medicare Other | Admitting: *Deleted

## 2014-10-04 ENCOUNTER — Telehealth: Payer: Self-pay | Admitting: Internal Medicine

## 2014-10-04 ENCOUNTER — Encounter: Payer: Self-pay | Admitting: Internal Medicine

## 2014-10-04 DIAGNOSIS — I441 Atrioventricular block, second degree: Secondary | ICD-10-CM

## 2014-10-04 NOTE — Telephone Encounter (Signed)
Informed pt that his transmission was received.  

## 2014-10-04 NOTE — Telephone Encounter (Signed)
New Message      Pt calling wanting to know how to do remote check. Please call back and advise. Pt has to leave at 10:30 for cardiac rehab, please call before then.

## 2014-10-05 NOTE — Progress Notes (Signed)
Remote pacemaker transmission.   

## 2014-10-20 ENCOUNTER — Ambulatory Visit (INDEPENDENT_AMBULATORY_CARE_PROVIDER_SITE_OTHER): Payer: Medicare Other | Admitting: Internal Medicine

## 2014-10-20 ENCOUNTER — Encounter: Payer: Self-pay | Admitting: Internal Medicine

## 2014-10-20 VITALS — BP 122/60 | HR 63 | Temp 98.7°F | Ht 72.0 in | Wt 172.2 lb

## 2014-10-20 DIAGNOSIS — R109 Unspecified abdominal pain: Secondary | ICD-10-CM | POA: Diagnosis not present

## 2014-10-20 DIAGNOSIS — I25810 Atherosclerosis of coronary artery bypass graft(s) without angina pectoris: Secondary | ICD-10-CM

## 2014-10-20 DIAGNOSIS — R194 Change in bowel habit: Secondary | ICD-10-CM | POA: Diagnosis not present

## 2014-10-20 MED ORDER — HYOSCYAMINE SULFATE 0.125 MG SL SUBL: 0.1250 mg | SUBLINGUAL_TABLET | Freq: Four times a day (QID) | SUBLINGUAL | Status: DC | PRN

## 2014-10-20 NOTE — Progress Notes (Signed)
Pre visit review using our clinic review tool, if applicable. No additional management support is needed unless otherwise documented below in the visit note. 

## 2014-10-20 NOTE — Patient Instructions (Signed)
Please keep a food diary of possible food triggers. If you have stool changes look @  the previous one to 2 meals.  Other food triggers for bowel dysfunction include lactose (milk sugar) or wheat / gluten . Please take a probiotic , Florastor OR Align, every day if the bowels are loose. This will replace the normal bacteria which  are necessary for formation of normal stool and processing of food.

## 2014-10-20 NOTE — Progress Notes (Signed)
   Subjective:    Patient ID: [name], male    DOB: 10-Oct-1926, 81 y.o.   MRN: [id number]  HPI He's had intermittent loose stool the last 2 months. It can resolve for up to 6-10 days. When it recurs it is loose-soft stool without frank diarrhea. He also has associated gas or "growling" prior to bowel changes  There are no specific triggers such as wheat or dairy. He denies excessive stress.  He does have a history of B12 deficiency.   Review of Systems Unexplained weight loss, abdominal pain, significant dyspepsia, dysphagia, melena, rectal bleeding, or persistently small caliber stools are denied.     Objective:   Physical Exam Pertinent or positive findings include: He does have low-grade rales in the bases, greater on the right than the left.  General appearance :adequately nourished; in no distress.  Eyes: No conjunctival inflammation or scleral icterus is present.  Oral exam:  Lips and gums are healthy appearing.There is no oropharyngeal erythema or exudate noted. Dental hygiene is good.  Heart:  Normal rate and regular rhythm. S1 and S2 normal without gallop, murmur, click, rub or other extra sounds    Lungs:.No increased work of breathing.   Abdomen: bowel sounds normal, soft and non-tender without masses, organomegaly or hernias noted.  No guarding or rebound. No flank tenderness to percussion.  Vascular : all pulses equal ; no bruits present.  Skin:Warm & dry.  Intact without suspicious lesions or rashes ; no tenting or jaundice   Lymphatic: No lymphadenopathy is noted about the head, neck, axilla.   Neuro: Strength, tone  normal.     Assessment & Plan:  #1 stool changes of intermittent  loose stool suggesting possible IBS variant  #2 cramping  Plan: See orders and recommendations

## 2014-11-03 LAB — CUP PACEART REMOTE DEVICE CHECK
Battery Impedance: 160 Ohm
Battery Remaining Longevity: 138 mo
Battery Voltage: 2.79 V
Brady Statistic AP VS Percent: 43 %
Implantable Lead Implant Date: 20140321
Implantable Lead Implant Date: 20140321
Implantable Lead Location: 753859
Implantable Lead Model: 5076
Lead Channel Impedance Value: 453 Ohm
Lead Channel Impedance Value: 625 Ohm
Lead Channel Pacing Threshold Pulse Width: 0.4 ms
Lead Channel Sensing Intrinsic Amplitude: 16 mV
Lead Channel Sensing Intrinsic Amplitude: 2.8 mV
Lead Channel Setting Pacing Amplitude: 2 V
Lead Channel Setting Pacing Amplitude: 2.5 V
Lead Channel Setting Pacing Pulse Width: 0.4 ms
MDC IDC LEAD LOCATION: 753860
MDC IDC MSMT LEADCHNL RA PACING THRESHOLD AMPLITUDE: 0.625 V
MDC IDC MSMT LEADCHNL RA PACING THRESHOLD PULSEWIDTH: 0.4 ms
MDC IDC MSMT LEADCHNL RV PACING THRESHOLD AMPLITUDE: 0.75 V
MDC IDC SESS DTM: 20160926154035
MDC IDC SET LEADCHNL RV SENSING SENSITIVITY: 5.6 mV
MDC IDC STAT BRADY AP VP PERCENT: 0 %
MDC IDC STAT BRADY AS VP PERCENT: 0 %
MDC IDC STAT BRADY AS VS PERCENT: 57 %

## 2014-11-18 ENCOUNTER — Encounter: Payer: Self-pay | Admitting: *Deleted

## 2014-11-19 ENCOUNTER — Other Ambulatory Visit: Payer: Self-pay | Admitting: Emergency Medicine

## 2014-11-19 MED ORDER — METOPROLOL SUCCINATE ER 25 MG PO TB24: 25.0000 mg | ORAL_TABLET | Freq: Two times a day (BID) | ORAL | Status: DC

## 2014-11-19 MED ORDER — FOLIC ACID 1 MG PO TABS: 1.0000 mg | ORAL_TABLET | Freq: Every day | ORAL | Status: DC

## 2014-11-19 MED ORDER — SIMVASTATIN 20 MG PO TABS: 20.0000 mg | ORAL_TABLET | Freq: Every day | ORAL | Status: DC

## 2014-11-19 MED ORDER — POTASSIUM CHLORIDE ER 10 MEQ PO TBCR: 10.0000 meq | EXTENDED_RELEASE_TABLET | Freq: Every day | ORAL | Status: DC

## 2014-11-29 ENCOUNTER — Telehealth: Payer: Self-pay | Admitting: Internal Medicine

## 2014-11-29 NOTE — Telephone Encounter (Signed)
Follow up      Returning a call to the nurse.  He is still hiccuping.  Please advise soon

## 2014-11-29 NOTE — Telephone Encounter (Signed)
Returning call. Pt says hiccups are in abdomen and throat. 10/04/14 remote transmission reviewed- 0% RV pacing. No LV lead. Pt advised to send transmission to see if there is an increase in RV pacing. Pt agreeable.

## 2014-11-29 NOTE — Telephone Encounter (Signed)
Transmission reviewed. <0.1% Vpacing. Recommended that the patient contact his PCP regarding hiccups. Pt verbalized understanding and is appreciative.

## 2014-11-29 NOTE — Telephone Encounter (Signed)
NeW Message  Pt c/o of hiccups since yesterday am (11/20) and throughout last night. Pt was woke up from hiccups- took aspirin and alleviated for short time and has come back. Please call back and discuss.

## 2014-11-29 NOTE — Telephone Encounter (Signed)
Spoke with patient- reports that hiccups have been an issue since pacer implant in March 2014. I did not see any mention of hiccups in Dr. Jackalyn Lombard or device clinic notes. Pt denies any pain. Advised that I would call pt back with a time to come to device clinic for pacer check.

## 2014-11-30 ENCOUNTER — Encounter: Payer: Self-pay | Admitting: Internal Medicine

## 2014-11-30 ENCOUNTER — Ambulatory Visit (INDEPENDENT_AMBULATORY_CARE_PROVIDER_SITE_OTHER): Payer: Medicare Other | Admitting: Internal Medicine

## 2014-11-30 ENCOUNTER — Telehealth: Payer: Self-pay | Admitting: Internal Medicine

## 2014-11-30 VITALS — BP 134/80 | HR 60 | Temp 97.9°F | Resp 16 | Wt 170.0 lb

## 2014-11-30 DIAGNOSIS — R066 Hiccough: Secondary | ICD-10-CM

## 2014-11-30 DIAGNOSIS — I25810 Atherosclerosis of coronary artery bypass graft(s) without angina pectoris: Secondary | ICD-10-CM | POA: Diagnosis not present

## 2014-11-30 MED ORDER — CHLORPROMAZINE HCL 25 MG PO TABS: 25.0000 mg | ORAL_TABLET | Freq: Three times a day (TID) | ORAL | Status: DC

## 2014-11-30 NOTE — Telephone Encounter (Signed)
Please advise 

## 2014-11-30 NOTE — Telephone Encounter (Signed)
He needs to do blood work and have a chest xray tomorrow.  We will start a medication called chlorpromazine.  This can cause changes in his behavior - if he has any side effects he or his wife need to call.  He could also cause a rash.  He can only take this for 7 days.

## 2014-11-30 NOTE — Telephone Encounter (Signed)
After pt left the office today he went for lunch then the hiccups started back again.

## 2014-11-30 NOTE — Progress Notes (Signed)
Pre visit review using our clinic review tool, if applicable. No additional management support is needed unless otherwise documented below in the visit note. 

## 2014-11-30 NOTE — Patient Instructions (Signed)
Since the hiccups have stopped and your exam is normal we will not do any further testing or start any new medication at this time.  If the hiccups recur please call and we will start a medication and do testing to figure out the cause.

## 2014-11-30 NOTE — Progress Notes (Signed)
Subjective:    Patient ID: [name], male    DOB: 1926/05/07, 81 y.o.   MRN: [id number]  HPI He is here for an acute visit for hiccups. He states that hiccups started 3 days ago in the afternoon. They have been intermittent. He had been 70% of the day yesterday. Yesterday morning he woke him up. He has a permanent pacemaker and he called his cardiology office yesterday and they did a test over the phone and his pacemaker is working okay.  Last night around 8 or 9:00 at night hiccups stopped and he has not had them since. He did experience some pain in his epigastric region and in his throat when he was hiccuping, but he feels that is related to the hiccups. He denies any other new or concerning symptoms.  He thinks he may have had something similar to this years ago. He thinks he didn't need to take medication but does not recall any other details.    Medications and allergies reviewed with patient and updated if appropriate.  Patient Active Problem List   Diagnosis Date Noted  . Otitis media with effusion 03/08/2014  . Acute upper respiratory infection 03/08/2014  . Ceruminosis 03/08/2014  . Claudication of left lower extremity ([place]) 03/02/2014  . Chest pain with moderate risk for cardiac etiology 01/30/2013  . 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 Symptomatic bradycardia 03/26/2012  . Pulmonary nodule   . CAD - CABG Oct 2013   . Chronic renal impairment, stage 3 (moderate)   . Venous insufficiency of leg 06/05/2010  . PERSISTENT DISORDER INITIATING/MAINTAINING SLEEP 11/09/2009  . SHOULDER PAIN, RIGHT, CHRONIC 10/14/2009  . B12 nutritional deficiency 08/23/2009  . Peripheral neuropathy ([place]) 03/21/2009  . DEGENERATIVE DISC DISEASE, CERVICAL SPINE, W/RADICULOPATHY 09/21/2008  . Dyslipidemia 01/17/2007  . Essential hypertension 07/20/2006  . PROSTATE CANCER, HX OF- surgery 2001 07/20/2006    Current Outpatient Prescriptions on File Prior to  Visit  Medication Sig Dispense Refill  . aspirin 325 MG buffered tablet Take 325 mg by mouth daily.      . carboxymethylcellulose (REFRESH PLUS) 0.5 % SOLN Place 1 drop into both eyes daily as needed (dry eyes). 30 mL 1  . clotrimazole-betamethasone (LOTRISONE) cream Apply 1 application topically daily as needed (itching). 45 g 3  . diphenhydrAMINE (BENYLIN) 12.5 MG/5ML syrup Take 5 mLs (12.5 mg total) by mouth at bedtime as needed for allergies. 120 mL 0  . ergocalciferol (VITAMIN D2) 50000 UNITS capsule Take 50,000 Units by mouth every 30 (thirty) days. 15 th of each month    . fluticasone (FLONASE) 50 MCG/ACT nasal spray Place 1 spray into both nostrils daily as needed for allergies or rhinitis.    . folic acid (FOLVITE) 1 MG tablet Take 1 tablet (1 mg total) by mouth daily. 90 tablet 2  . hyoscyamine (LEVSIN/SL) 0.125 MG SL tablet Place 1 tablet (0.125 mg total) under the tongue every 6 (six) hours as needed. 30 tablet 0  . metoprolol succinate (TOPROL-XL) 25 MG 24 hr tablet Take 1 tablet (25 mg total) by mouth 2 (two) times daily. 180 tablet 2  . nitroGLYCERIN (NITROSTAT) 0.4 MG SL tablet Place 1 tablet (0.4 mg total) under the tongue every 5 (five) minutes as needed for chest pain (MAX 3 TABLETS). 90 tablet 3  . potassium chloride (K-DUR) 10 MEQ tablet Take 1 tablet (10 mEq total) by mouth daily. 90 tablet 2  . simvastatin (ZOCOR) 20 MG  tablet Take 1 tablet (20 mg total) by mouth daily. 90 tablet 2  . vitamin B-12 (CYANOCOBALAMIN) 1000 MCG tablet Take 1,000 mcg by mouth daily.    . vitamin C (ASCORBIC ACID) 500 MG tablet Take 1,500 mg by mouth daily.     No current facility-administered medications on file prior to visit.    Past Medical History  Diagnosis Date  . Hypertension     well-controlled.  [name] CAD (coronary artery disease)     a. S/P stenting to mid RCA, prox PDA 06/1999. b. NSTEMI/CABG x 3 in 10/2011 with LIMA to LAD, SVG to PDA, and SVG to OM1.   . CKD (chronic kidney  disease), stage III     a. stable with a creatinine around 1.9-2.0 followed by nephrology.  . Dyslipidemia   . Neck injury     a. C3-C4 and C4-C5 foraminal narrowing, severe  . Renal artery stenosis ([place])     a. 50% by cath 2001  . Pulmonary nodule     a. felt to be noncancerous.  Status post followup CT scan 4 mm and stable.  . Chronic UTI     a. Followed by Dr. Risa Grill - colonized/asymptomatic - not on abx  . Atrial fibrillation ([place])     a. Post-op from CABG, on amiodarone temporarily, d/c'd 12/2011  . Acute superficial venous thrombosis of lower extremity     a. RLE after CABG, neg dopp for DVT.  [name] Prostate cancer ([place])     a. 2001 s/p TURP.  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 S/p ptca    . Cardia catherization  07-07-99  . Peripheral vascular catherization  11-24-03  . Renal circulation  10-01-03  . Stress cardiolite  05-04-05    spring 09-negative except for apical thinning, EF 68%  . Edg  07-17-1994  . Flexible sigmoidoscopy  11-03-1997  . Prostatectomy    . Lumbar spinal disk and neck fusion surgery    . Stents      X 2  . Coronary artery bypass graft  10/19/2011    Procedure: CORONARY ARTERY BYPASS GRAFTING (CABG);  Surgeon: Gaye Pollack, MD;  Location: [place];  Service: Open Heart Surgery;  Laterality: N/A;  times three using Left Internal Mammary Artery and Right Greater Saphenouse Vein Graft harvested Endoscopically  . Cardiac surgery  10/18/12    open heart surgery  . Colonoscopy  04/12/07  . Pacemaker insertion  03/28/12    PPM implanted for mobitz II AV block  . Cataract extraction w/ intraocular lens  implant, bilateral  3/205, 06/2013    [place]  . Left heart catheterization with coronary angiogram N/A 10/16/2011    Procedure: LEFT HEART CATHETERIZATION WITH CORONARY ANGIOGRAM;  Surgeon: Peter M Martinique, MD;  Location: [facility] CATH LAB;  Service: Cardiovascular;   Laterality: N/A;  . Permanent pacemaker insertion N/A 03/28/2012    Procedure: PERMANENT PACEMAKER INSERTION;  Surgeon: Thompson Grayer, MD;  Location: [facility] CATH LAB;  Service: Cardiovascular;  Laterality: N/A;  . Left heart catheterization with coronary/graft angiogram N/A 03/11/2013    Procedure: LEFT HEART CATHETERIZATION WITH [name];  Surgeon: Blane Ohara, MD;  Location: [facility] CATH LAB;  Service: Cardiovascular;  Laterality: N/A;    Social History   Social History  . Marital Status: Married    Spouse Name: N/A  . Number of Children: 2  .  Years of Education: N/A   Occupational History  . 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     Social History Main Topics  . Smoking status: Never Smoker   . Smokeless tobacco: Never Used  . Alcohol Use: Yes     Comment: Rarely  . Drug Use: No  . Sexual Activity: Not on file   Other Topics Concern  . Not on file   Social History Narrative   HSG, 1 year college.  married '52 - 3 years, divorced; married '56 - 3 years divorced; married '40-12 yrs - divorced; married '75 -. 1 son '57; 1 daughter - '53; 1 grandchild.  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  Very happily married.  End of life care: yes CPR, no long term mechanical ventilation, no heroic measures.    Review of Systems  Constitutional: Negative for fever, chills, appetite change and unexpected weight change.  HENT: Positive for rhinorrhea (chronic). Negative for congestion, ear pain, hearing loss, sore throat and trouble swallowing.   Eyes: Negative for visual disturbance.  Respiratory: Positive for cough (occasional, chornic, dry). Negative for shortness of breath and wheezing.   Cardiovascular: Negative for chest pain, palpitations and leg swelling.  Gastrointestinal: Negative for nausea and abdominal pain.       No GERD  Neurological:  Positive for numbness (neuropathy in leg - chronic). Negative for dizziness, speech difficulty, weakness, light-headedness and headaches.       Objective:   Filed Vitals:   11/30/14 1327  BP: 134/80  Pulse: 60  Temp: 97.9 F (36.6 C)  Resp: 16   Filed Weights   11/30/14 1327  Weight: 170 lb (77.111 kg)   Body mass index is 23.05 kg/(m^2).   Physical Exam  Constitutional: He appears well-developed and well-nourished. No distress.  HENT:  Head: Normocephalic and atraumatic.  Right Ear: External ear normal.  Left Ear: External ear normal.  Nose: Nose normal.  Mouth/Throat: Oropharynx is clear and moist. No oropharyngeal exudate.  Bilateral ear canals and tympanic membranes normal  Eyes: Conjunctivae are normal.  Neck: Neck supple. No JVD present. No tracheal deviation present. No thyromegaly present.  No carotid bruit  Cardiovascular: Normal rate, regular rhythm and normal heart sounds.   Pulmonary/Chest: Effort normal and breath sounds normal. No respiratory distress. He has no wheezes. He has no rales.  Abdominal: Soft. He exhibits no distension. There is no tenderness.  Musculoskeletal: He exhibits no edema.  Lymphadenopathy:    He has no cervical adenopathy.  Neurological: He is alert. No cranial nerve deficit.  Skin: Skin is warm and dry. He is not diaphoretic.  Psychiatric: He has a normal mood and affect. His behavior is normal.          Assessment & Plan:   Hiccups, intermittent Intermittent for 3 days No obvious cause Exam normal No other concerning symptoms Since it has have seemed to resolve we will hold off on further evaluation and any treatment at this time If he continues to have hiccups tonight or tomorrow he will call and we will need to check blood work, chest x-ray, additional tests and start a medication to help relieve his symptoms

## 2014-12-01 ENCOUNTER — Ambulatory Visit (INDEPENDENT_AMBULATORY_CARE_PROVIDER_SITE_OTHER)
Admission: RE | Admit: 2014-12-01 | Discharge: 2014-12-01 | Disposition: A | Discharge status: Home / Self Care | Payer: Medicare Other | Source: Ambulatory Visit | Attending: Internal Medicine | Admitting: Internal Medicine

## 2014-12-01 ENCOUNTER — Telehealth: Payer: Self-pay | Admitting: Internal Medicine

## 2014-12-01 ENCOUNTER — Other Ambulatory Visit (INDEPENDENT_AMBULATORY_CARE_PROVIDER_SITE_OTHER): Payer: Medicare Other

## 2014-12-01 DIAGNOSIS — R066 Hiccough: Secondary | ICD-10-CM

## 2014-12-01 LAB — CBC WITH DIFFERENTIAL/PLATELET
BASOS PCT: 0.6 % (ref 0.0–3.0)
Basophils Absolute: 0 10*3/uL (ref 0.0–0.1)
Eosinophils Absolute: 0.2 10*3/uL (ref 0.0–0.7)
Eosinophils Relative: 2.9 % (ref 0.0–5.0)
HCT: 43.4 % (ref 39.0–52.0)
HEMOGLOBIN: 14 g/dL (ref 13.0–17.0)
LYMPHS ABS: 1.3 10*3/uL (ref 0.7–4.0)
Lymphocytes Relative: 23.3 % (ref 12.0–46.0)
MCHC: 32.2 g/dL (ref 30.0–36.0)
MCV: 78.6 fl (ref 78.0–100.0)
MONO ABS: 0.5 10*3/uL (ref 0.1–1.0)
Monocytes Relative: 9.4 % (ref 3.0–12.0)
NEUTROS PCT: 63.8 % (ref 43.0–77.0)
Neutro Abs: 3.6 10*3/uL (ref 1.4–7.7)
Platelets: 207 10*3/uL (ref 150.0–400.0)
RBC: 5.52 Mil/uL (ref 4.22–5.81)
RDW: 15.6 % — ABNORMAL HIGH (ref 11.5–15.5)
WBC: 5.7 10*3/uL (ref 4.0–10.5)

## 2014-12-01 LAB — COMPREHENSIVE METABOLIC PANEL
ALT: 13 U/L (ref 0–53)
AST: 16 U/L (ref 0–37)
Albumin: 4.1 g/dL (ref 3.5–5.2)
Alkaline Phosphatase: 57 U/L (ref 39–117)
BUN: 28 mg/dL — AB (ref 6–23)
CHLORIDE: 105 meq/L (ref 96–112)
CO2: 27 mEq/L (ref 19–32)
Calcium: 9.4 mg/dL (ref 8.4–10.5)
Creatinine, Ser: 1.97 mg/dL — ABNORMAL HIGH (ref 0.40–1.50)
GFR: 34.4 mL/min — ABNORMAL LOW (ref >60.00)
GLUCOSE: 93 mg/dL (ref 70–99)
POTASSIUM: 4.4 meq/L (ref 3.5–5.1)
SODIUM: 140 meq/L (ref 135–145)
Total Bilirubin: 0.7 mg/dL (ref 0.2–1.2)
Total Protein: 6.9 g/dL (ref 6.0–8.3)

## 2014-12-01 LAB — LIPASE: Lipase: 46 U/L (ref 11.0–59.0)

## 2014-12-01 LAB — AMYLASE: AMYLASE: 59 U/L (ref 27–131)

## 2014-12-01 NOTE — Telephone Encounter (Signed)
Pt informed. He will come in today for lab and xray

## 2014-12-01 NOTE — Telephone Encounter (Signed)
Spoke with pt to inform. Pt stated that he has only had the Hiccups once this morning for about 1 minute. He has not taken the medication prescribed because he has to verify all medications with the Kidney Doc. Pt is to call back if needed.

## 2014-12-01 NOTE — Telephone Encounter (Signed)
His kidney function is stable and his other blood work is normal.  Given his other lack of symptoms there is no obvious cause for his hiccups.  Ask him how the medication is working

## 2015-01-04 ENCOUNTER — Ambulatory Visit (INDEPENDENT_AMBULATORY_CARE_PROVIDER_SITE_OTHER): Payer: Medicare Other | Admitting: *Deleted

## 2015-01-04 DIAGNOSIS — R001 Bradycardia, unspecified: Secondary | ICD-10-CM | POA: Diagnosis not present

## 2015-01-05 NOTE — Progress Notes (Signed)
Remote pacemaker transmission.   

## 2015-01-06 ENCOUNTER — Encounter: Payer: Self-pay | Admitting: Cardiology

## 2015-01-06 LAB — CUP PACEART REMOTE DEVICE CHECK
Battery Remaining Longevity: 133 mo
Brady Statistic AP VS Percent: 40 %
Brady Statistic AS VP Percent: 0 %
Implantable Lead Implant Date: 20140321
Implantable Lead Location: 753859
Lead Channel Impedance Value: 607 Ohm
Lead Channel Pacing Threshold Amplitude: 0.75 V
Lead Channel Pacing Threshold Pulse Width: 0.4 ms
Lead Channel Pacing Threshold Pulse Width: 0.4 ms
Lead Channel Setting Pacing Amplitude: 2 V
Lead Channel Setting Pacing Amplitude: 2.5 V
Lead Channel Setting Pacing Pulse Width: 0.4 ms
Lead Channel Setting Sensing Sensitivity: 5.6 mV
MDC IDC LEAD IMPLANT DT: 20140321
MDC IDC LEAD LOCATION: 753860
MDC IDC MSMT BATTERY IMPEDANCE: 184 Ohm
MDC IDC MSMT BATTERY VOLTAGE: 2.79 V
MDC IDC MSMT LEADCHNL RA IMPEDANCE VALUE: 459 Ohm
MDC IDC MSMT LEADCHNL RA PACING THRESHOLD AMPLITUDE: 0.625 V
MDC IDC MSMT LEADCHNL RA SENSING INTR AMPL: 2.8 mV
MDC IDC MSMT LEADCHNL RV SENSING INTR AMPL: 16 mV
MDC IDC SESS DTM: 20161227142018
MDC IDC STAT BRADY AP VP PERCENT: 0 %
MDC IDC STAT BRADY AS VS PERCENT: 60 %

## 2015-02-01 ENCOUNTER — Encounter: Payer: Self-pay | Admitting: Internal Medicine

## 2015-02-01 ENCOUNTER — Ambulatory Visit (INDEPENDENT_AMBULATORY_CARE_PROVIDER_SITE_OTHER): Payer: Medicare Other | Admitting: Internal Medicine

## 2015-02-01 VITALS — BP 128/80 | HR 75 | Temp 98.2°F | Resp 20 | Wt 169.8 lb

## 2015-02-01 DIAGNOSIS — R05 Cough: Secondary | ICD-10-CM | POA: Diagnosis not present

## 2015-02-01 DIAGNOSIS — I10 Essential (primary) hypertension: Secondary | ICD-10-CM

## 2015-02-01 DIAGNOSIS — N183 Chronic kidney disease, stage 3 unspecified: Secondary | ICD-10-CM

## 2015-02-01 DIAGNOSIS — R059 Cough, unspecified: Secondary | ICD-10-CM | POA: Insufficient documentation

## 2015-02-01 MED ORDER — HYDROCODONE-HOMATROPINE 5-1.5 MG/5ML PO SYRP: 5.0000 mL | ORAL_SOLUTION | Freq: Four times a day (QID) | ORAL | Status: DC | PRN

## 2015-02-01 MED ORDER — CEPHALEXIN 500 MG PO CAPS: 500.0000 mg | ORAL_CAPSULE | Freq: Four times a day (QID) | ORAL | Status: DC

## 2015-02-01 NOTE — Progress Notes (Signed)
Pre visit review using our clinic review tool, if applicable. No additional management support is needed unless otherwise documented below in the visit note. 

## 2015-02-01 NOTE — Progress Notes (Signed)
Subjective:    Patient ID: [name], male    DOB: 1926-10-07, 82 y.o.   MRN: [id number]  HPI  Here with acute onset mild to mod 2-3 days ST, HA, general weakness and malaise, with prod cough greenish sputum, but Pt denies chest pain, increased sob or doe, wheezing, orthopnea, PND, increased LE swelling, palpitations, dizziness or syncope.  Pt denies new neurological symptoms such as new headache, or facial or extremity weakness or numbness   Pt denies polydipsia, polyuria  Pt states overall good compliance with meds, trying to follow lower cholesterol diet, wt overall stable. Past Medical History  Diagnosis Date  . Hypertension     well-controlled.  [name] CAD (coronary artery disease)     a. S/P stenting to mid RCA, prox PDA 06/1999. b. NSTEMI/CABG x 3 in 10/2011 with LIMA to LAD, SVG to PDA, and SVG to OM1.   . CKD (chronic kidney disease), stage III     a. stable with a creatinine around 1.9-2.0 followed by nephrology.  . Dyslipidemia   . Neck injury     a. C3-C4 and C4-C5 foraminal narrowing, severe  . Renal artery stenosis ([place])     a. 50% by cath 2001  . Pulmonary nodule     a. felt to be noncancerous.  Status post followup CT scan 4 mm and stable.  . Chronic UTI     a. Followed by Dr. Risa [name] - colonized/asymptomatic - not on abx  . Atrial fibrillation ([place])     a. Post-op from CABG, on amiodarone temporarily, d/c'd 12/2011  . Acute superficial venous thrombosis of lower extremity     a. RLE after CABG, neg dopp for DVT.  [name] Prostate cancer ([place])     a. 2001 s/p TURP.  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 S/p ptca    . Cardia catherization  07-07-99  . Peripheral vascular catherization  11-24-03  . Renal circulation  10-01-03  . Stress cardiolite  05-04-05    spring 09-negative except for apical thinning, EF 68%  . Edg  07-17-1994  . Flexible sigmoidoscopy  11-03-1997  .  Prostatectomy    . Lumbar spinal disk and neck fusion surgery    . Stents      X 2  . Coronary artery bypass graft  10/19/2011    Procedure: CORONARY ARTERY BYPASS GRAFTING (CABG);  Surgeon: Mike Pollack, MD;  Location: [place];  Service: Open Heart Surgery;  Laterality: N/A;  times three using Left Internal Mammary Artery and Right Greater Saphenouse Vein Graft harvested Endoscopically  . Cardiac surgery  10/18/12    open heart surgery  . Colonoscopy  04/12/07  . Pacemaker insertion  03/28/12    PPM implanted for mobitz II AV block  . Cataract extraction w/ intraocular lens  implant, bilateral  3/205, 06/2013    [name]  . Left heart catheterization with coronary angiogram N/A 10/16/2011    Procedure: LEFT HEART CATHETERIZATION WITH CORONARY ANGIOGRAM;  Surgeon: Mike M Martinique, MD;  Location: [facility] CATH LAB;  Service: Cardiovascular;  Laterality: N/A;  . Permanent pacemaker insertion N/A 03/28/2012    Procedure: PERMANENT PACEMAKER INSERTION;  Surgeon: Mike Grayer, MD;  Location: [facility] CATH LAB;  Service: Cardiovascular;  Laterality: N/A;  . Left heart catheterization with coronary/graft angiogram N/A 03/11/2013    Procedure: LEFT HEART CATHETERIZATION WITH CORONARY/GRAFT ANGIOGRAM;  Surgeon:  Mike Ohara, MD;  Location: [facility] CATH LAB;  Service: Cardiovascular;  Laterality: N/A;    reports that he has never smoked. He has never used smokeless tobacco. He reports that he drinks alcohol. He reports that he does not use illicit drugs. family history includes Arthritis in his mother; Breast cancer in his other; Cancer in his brother; Coronary artery disease in his father; Heart disease in his brother; Nephritis in his brother; Other in his brother and mother; Prostate cancer in his brother. There is no history of Diabetes. Allergies  Allergen Reactions  . Amoxicillin Nausea Only  . Aspirin Other (See Comments)    REACTION: upset stomach; tolerates coated aspirin  . Atarax [Hydroxyzine Hcl] Other (See  Comments)    REACTION: unknown  . Ciprofloxacin Nausea Only  . Codeine     REACTION: Stomach upset  . Hydrocodone Nausea Only  . Hydroxyzine Nausea And Vomiting  . Macrobid [Nitrofurantoin Macrocrystal] Nausea Only  . Niacin Other (See Comments)    REACTION: unknown  . Niacin-Lovastatin Er     Unsure of reaction. Taking simvastatin at home without problems  . Nitrofurantoin Other (See Comments)    REACTION: upset stomach  . Trimox [Amoxicillin Trihydrate] Other (See Comments)    REACTION: unknown  . Bactrim [Sulfamethoxazole-Trimethoprim] Rash   Current Outpatient Prescriptions on File Prior to Visit  Medication Sig Dispense Refill  . aspirin 325 MG buffered tablet Take 325 mg by mouth daily.      . carboxymethylcellulose (REFRESH PLUS) 0.5 % SOLN Place 1 drop into both eyes daily as needed (dry eyes). 30 mL 1  . chlorproMAZINE (THORAZINE) 25 MG tablet Take 1 tablet (25 mg total) by mouth 3 (three) times daily. 21 tablet 0  . clotrimazole-betamethasone (LOTRISONE) cream Apply 1 application topically daily as needed (itching). 45 g 3  . diphenhydrAMINE (BENYLIN) 12.5 MG/5ML syrup Take 5 mLs (12.5 mg total) by mouth at bedtime as needed for allergies. 120 mL 0  . ergocalciferol (VITAMIN D2) 50000 UNITS capsule Take 50,000 Units by mouth every 30 (thirty) days. 15 th of each month    . fluticasone (FLONASE) 50 MCG/ACT nasal spray Place 1 spray into both nostrils daily as needed for allergies or rhinitis.    . folic acid (FOLVITE) 1 MG tablet Take 1 tablet (1 mg total) by mouth daily. 90 tablet 2  . hyoscyamine (LEVSIN/SL) 0.125 MG SL tablet Place 1 tablet (0.125 mg total) under the tongue every 6 (six) hours as needed. 30 tablet 0  . metoprolol succinate (TOPROL-XL) 25 MG 24 hr tablet Take 1 tablet (25 mg total) by mouth 2 (two) times daily. 180 tablet 2  . Multiple Vitamins-Minerals (PRESERVISION/LUTEIN PO) Take by mouth 2 (two) times daily.    . nitroGLYCERIN (NITROSTAT) 0.4 MG SL  tablet Place 1 tablet (0.4 mg total) under the tongue every 5 (five) minutes as needed for chest pain (MAX 3 TABLETS). 90 tablet 3  . potassium chloride (K-DUR) 10 MEQ tablet Take 1 tablet (10 mEq total) by mouth daily. 90 tablet 2  . simvastatin (ZOCOR) 20 MG tablet Take 1 tablet (20 mg total) by mouth daily. 90 tablet 2  . vitamin B-12 (CYANOCOBALAMIN) 1000 MCG tablet Take 1,000 mcg by mouth daily.    . vitamin C (ASCORBIC ACID) 500 MG tablet Take 1,500 mg by mouth daily.     No current facility-administered medications on file prior to visit.   Review of Systems  Constitutional: Negative for unusual diaphoresis or night  sweats HENT: Negative for ringing in ear or discharge Eyes: Negative for double vision or worsening visual disturbance.  Respiratory: Negative for choking and stridor.   Gastrointestinal: Negative for vomiting or other signifcant bowel change Genitourinary: Negative for hematuria or change in urine volume.  Musculoskeletal: Negative for other MSK pain or swelling Skin: Negative for color change and worsening wound.  Neurological: Negative for tremors and numbness other than noted  Psychiatric/Behavioral: Negative for decreased concentration or agitation other than above       Objective:   Physical Exam BP 128/80 mmHg  Pulse 75  Temp(Src) 98.2 F (36.8 C) (Oral)  Resp 20  Wt 169 lb 12 oz (76.998 kg)  SpO2 98% VS noted, midl ill Constitutional: Pt appears in no significant distress HENT: Head: NCAT.  Right Ear: External ear normal.  Left Ear: External ear normal.  Bilat tm's with mild erythema.  Max sinus areas mild tender.  Pharynx with mild erythema, no exudate Eyes: . Pupils are equal, round, and reactive to light. Conjunctivae and EOM are normal Neck: Normal range of motion. Neck supple.  Cardiovascular: Normal rate and regular rhythm.   Pulmonary/Chest: Effort normal and breath sounds without rales or wheezing.  Neurological: Pt is alert. Not confused ,  motor grossly intact Skin: Skin is warm. No rash, no LE edema Psychiatric: Pt behavior is normal. No agitation.     Assessment & Plan:

## 2015-02-01 NOTE — Patient Instructions (Signed)
Please take all new medication as prescribed - the antibiotic, and cough medicine  Please continue all other medications as before, and refills have been done if requested.  Please have the pharmacy call with any other refills you may need.  Please keep your appointments with your specialists as you may have planned      

## 2015-02-05 NOTE — Assessment & Plan Note (Signed)
stable overall by history and exam, recent data reviewed with pt, and pt to continue medical treatment as before,  to f/u any worsening symptoms or concerns BP Readings from Last 3 Encounters:  02/01/15 128/80  11/30/14 134/80  10/20/14 122/60

## 2015-02-05 NOTE — Assessment & Plan Note (Signed)
stable overall by history and exam, recent data reviewed with pt, and pt to continue medical treatment as before,  to f/u any worsening symptoms or concerns Lab Results  Component Value Date   CREATININE 1.97* 12/01/2014

## 2015-02-05 NOTE — Assessment & Plan Note (Signed)
Mild to mod, c/w bronchitis vs pna, declines cxr, for antibx course, to f/u any worsening symptoms or concerns  

## 2015-03-01 ENCOUNTER — Ambulatory Visit (INDEPENDENT_AMBULATORY_CARE_PROVIDER_SITE_OTHER): Payer: Medicare Other | Admitting: Family

## 2015-03-01 ENCOUNTER — Encounter: Payer: Self-pay | Admitting: Family

## 2015-03-01 VITALS — BP 142/80 | HR 64 | Temp 97.8°F | Resp 16 | Ht 72.0 in | Wt 167.1 lb

## 2015-03-01 DIAGNOSIS — K5900 Constipation, unspecified: Secondary | ICD-10-CM

## 2015-03-01 HISTORY — DX: Constipation, unspecified: K59.00

## 2015-03-01 NOTE — Progress Notes (Signed)
Subjective:    Patient ID: [name], male    DOB: [birth year], 82 y.o.   MRN: [id number]  Chief Complaint  Patient presents with  . Stomach issues    had issues in january with 5 days of constipation and then had 5 days of diarrhea, every since has been having constipation    HPI:  [name] is a 82 y.o. male who  has a past medical history of Hypertension; CAD (coronary artery disease); CKD (chronic kidney disease), stage III; Dyslipidemia; Neck injury; Renal artery stenosis ([place]); Pulmonary nodule; Chronic UTI; Atrial fibrillation ([name]); Acute superficial venous thrombosis of lower extremity; Prostate cancer ([place]); and Symptomatic bradycardia. and presents today for an acute office visit.  This is a new problem. Associated symptom of constipation has been waxing and waning for the past couple of weeks. Notes constipation for approximately 5 days that he treated with MiraLAX per pharmacy which allowed him to have a bowel movement and also gave him diarrhea. He experienced several days of diarrhea following MiraLAX treatment and was recommended Imodium by pharmacy to help with his diarrhea symptoms. Over the past week he is experienced 2 small bowel movements followed by a "very good, soft bowel movement" this morning. No abdominal pain, nausea, vomiting, blood in stool, or diarrhea. Does pass flatulence throughout the day. Diet consists of cereal or breakfast pastry, meat, rice and salad for lunch, and a small bowl of soup for dinner on average. His normal bowel movement pattern is approximately one to 2 times per week.  Allergies  Allergen Reactions  . Amoxicillin Nausea Only  . Aspirin Other (See Comments)    REACTION: upset stomach; tolerates coated aspirin  . Atarax [Hydroxyzine Hcl] Other (See Comments)    REACTION: unknown  . Ciprofloxacin Nausea Only  . Codeine     REACTION: Stomach upset  . Hydrocodone Nausea Only  . Hydroxyzine Nausea And Vomiting  . Macrobid  [Nitrofurantoin Macrocrystal] Nausea Only  . Niacin Other (See Comments)    REACTION: unknown  . Niacin-Lovastatin Er     Unsure of reaction. Taking simvastatin at home without problems  . Nitrofurantoin Other (See Comments)    REACTION: upset stomach  . Trimox [Amoxicillin Trihydrate] Other (See Comments)    REACTION: unknown  . Bactrim [Sulfamethoxazole-Trimethoprim] Rash     Current Outpatient Prescriptions on File Prior to Visit  Medication Sig Dispense Refill  . aspirin 325 MG buffered tablet Take 325 mg by mouth daily.      . carboxymethylcellulose (REFRESH PLUS) 0.5 % SOLN Place 1 drop into both eyes daily as needed (dry eyes). 30 mL 1  . ergocalciferol (VITAMIN D2) 50000 UNITS capsule Take 50,000 Units by mouth every 30 (thirty) days. 15 th of each month    . folic acid (FOLVITE) 1 MG tablet Take 1 tablet (1 mg total) by mouth daily. 90 tablet 2  . metoprolol succinate (TOPROL-XL) 25 MG 24 hr tablet Take 1 tablet (25 mg total) by mouth 2 (two) times daily. 180 tablet 2  . nitroGLYCERIN (NITROSTAT) 0.4 MG SL tablet Place 1 tablet (0.4 mg total) under the tongue every 5 (five) minutes as needed for chest pain (MAX 3 TABLETS). 90 tablet 3  . potassium chloride (K-DUR) 10 MEQ tablet Take 1 tablet (10 mEq total) by mouth daily. 90 tablet 2  . simvastatin (ZOCOR) 20 MG tablet Take 1 tablet (20 mg total) by mouth daily. 90 tablet 2  . vitamin B-12 (CYANOCOBALAMIN) 1000 MCG tablet  Take 1,000 mcg by mouth daily.    . vitamin C (ASCORBIC ACID) 500 MG tablet Take 1,500 mg by mouth daily.     No current facility-administered medications on file prior to visit.      Review of Systems  Constitutional: Negative for fever and chills.  Gastrointestinal: Positive for constipation. Negative for nausea, vomiting, diarrhea and blood in stool.  Neurological: Negative for headaches.      Objective:    BP 142/80 mmHg  Pulse 64  Temp(Src) 97.8 F (36.6 C) (Oral)  Resp 16  Ht 6' (1.829 m)   Wt 167 lb 1.9 oz (75.805 kg)  BMI 22.66 kg/m2  SpO2 98% Nursing note and vital signs reviewed.  Physical Exam  Constitutional: He is oriented to person, place, and time. He appears well-developed and well-nourished. No distress.  Cardiovascular: Normal rate, regular rhythm, normal heart sounds and intact distal pulses.   Pulmonary/Chest: Effort normal and breath sounds normal.  Abdominal: Soft. Normal appearance and bowel sounds are normal. He exhibits no mass. There is no hepatosplenomegaly. There is no tenderness. There is no rigidity, no rebound, no guarding, no tenderness at McBurney's point and negative Murphy's sign.  Neurological: He is alert and oriented to person, place, and time.  Skin: Skin is warm and dry.  Psychiatric: He has a normal mood and affect. His behavior is normal. Judgment and thought content normal.       Assessment & Plan:   Problem List Items Addressed This Visit      Digestive   Constipation - Primary    Symptoms and exam consistent with constipation. Abdominal exam is benign with no evidence of pain or obstruction. Discussed importance of increasing fiber and water intake coupled with physical activity to assist with bowel movements. Continue over-the-counter medication management as needed. Follow-up if symptoms worsen or fail to improve.

## 2015-03-01 NOTE — Progress Notes (Signed)
Pre visit review using our clinic review tool, if applicable. No additional management support is needed unless otherwise documented below in the visit note. 

## 2015-03-01 NOTE — Patient Instructions (Signed)
Thank you for choosing [facility].  Summary/Instructions:  Recommend increasing fiber, water intake and physical activity.  Recommend trialing of Sennakot or Ducolox as needed.  If your symptoms worsen or fail to improve, please contact our office for further instruction, or in case of emergency go directly to the emergency room at the closest medical facility.    Constipation, Adult Constipation is when a person has fewer than three bowel movements a week, has difficulty having a bowel movement, or has stools that are dry, hard, or larger than normal. As people grow older, constipation is more common. A low-fiber diet, not taking in enough fluids, and taking certain medicines may make constipation worse.  CAUSES   Certain medicines, such as antidepressants, pain medicine, iron supplements, antacids, and water pills.   Certain diseases, such as diabetes, irritable bowel syndrome (IBS), thyroid disease, or depression.   Not drinking enough water.   Not eating enough fiber-rich foods.   Stress or travel.   Lack of physical activity or exercise.   Ignoring the urge to have a bowel movement.   Using laxatives too much.  SIGNS AND SYMPTOMS   Having fewer than three bowel movements a week.   Straining to have a bowel movement.   Having stools that are hard, dry, or larger than normal.   Feeling full or bloated.   Pain in the lower abdomen.   Not feeling relief after having a bowel movement.  DIAGNOSIS  Your health care provider will take a medical history and perform a physical exam. Further testing may be done for severe constipation. Some tests may include:  A barium enema X-ray to examine your rectum, colon, and, sometimes, your small intestine.   A sigmoidoscopy to examine your lower colon.   A colonoscopy to examine your entire colon. TREATMENT  Treatment will depend on the severity of your constipation and what is causing it. Some dietary  treatments include drinking more fluids and eating more fiber-rich foods. Lifestyle treatments may include regular exercise. If these diet and lifestyle recommendations do not help, your health care provider may recommend taking over-the-counter laxative medicines to help you have bowel movements. Prescription medicines may be prescribed if over-the-counter medicines do not work.  HOME CARE INSTRUCTIONS   Eat foods that have a lot of fiber, such as fruits, vegetables, whole grains, and beans.  Limit foods high in fat and processed sugars, such as french fries, hamburgers, cookies, candies, and soda.   A fiber supplement may be added to your diet if you cannot get enough fiber from foods.   Drink enough fluids to keep your urine clear or pale yellow.   Exercise regularly or as directed by your health care provider.   Go to the restroom when you have the urge to go. Do not hold it.   Only take over-the-counter or prescription medicines as directed by your health care provider. Do not take other medicines for constipation without talking to your health care provider first.  [place] IF:   You have bright red blood in your stool.   Your constipation lasts for more than 4 days or gets worse.   You have abdominal or rectal pain.   You have thin, pencil-like stools.   You have unexplained weight loss. MAKE SURE YOU:   Understand these instructions.  Will watch your condition.  Will get help right away if you are not doing well or get worse.   This information is not intended to replace  advice given to you by your health care provider. Make sure you discuss any questions you have with your health care provider.   Document Released: 09/23/2003 Document Revised: 01/15/2014 Document Reviewed: 10/06/2012 Elsevier Interactive Patient Education [employer].

## 2015-03-01 NOTE — Assessment & Plan Note (Signed)
Symptoms and exam consistent with constipation. Abdominal exam is benign with no evidence of pain or obstruction. Discussed importance of increasing fiber and water intake coupled with physical activity to assist with bowel movements. Continue over-the-counter medication management as needed. Follow-up if symptoms worsen or fail to improve.

## 2015-04-04 ENCOUNTER — Ambulatory Visit (INDEPENDENT_AMBULATORY_CARE_PROVIDER_SITE_OTHER): Payer: Medicare Other | Admitting: Internal Medicine

## 2015-04-04 ENCOUNTER — Encounter: Payer: Self-pay | Admitting: Internal Medicine

## 2015-04-04 VITALS — BP 118/62 | HR 67 | Ht 72.0 in | Wt 168.2 lb

## 2015-04-04 DIAGNOSIS — I2510 Atherosclerotic heart disease of native coronary artery without angina pectoris: Secondary | ICD-10-CM

## 2015-04-04 DIAGNOSIS — I10 Essential (primary) hypertension: Secondary | ICD-10-CM

## 2015-04-04 DIAGNOSIS — I441 Atrioventricular block, second degree: Secondary | ICD-10-CM

## 2015-04-04 NOTE — Progress Notes (Signed)
Electrophysiology Office Note   Date:  04/04/2015   ID:  [name], DOB 1926/01/26, MRN [id number]  PCP:  [name], MD  Cardiologist:  Dr Burt Knack Primary Electrophysiologist: Thompson Grayer, MD    Chief Complaint  Patient presents with  . Pacemaker Check     History of Present Illness: [name] is a 82 y.o. male who presents today for electrophysiology evaluation.   He is doing very well.  Remains active.  Continues to travel with his family and is planning to go [place] this summer.  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  We also looked at his Rolex which he received overseas in 1956.  He has known 4 of the living [place]. Today, he denies symptoms of palpitations, chest pain, shortness of breath, orthopnea, PND, lower extremity edema, claudication, dizziness, presyncope, syncope, bleeding, or neurologic sequela. The patient is tolerating medications without difficulties and is otherwise without complaint today.    Past Medical History  Diagnosis Date  . Hypertension     well-controlled.  [name] CAD (coronary artery disease)     a. S/P stenting to mid RCA, prox PDA 06/1999. b. NSTEMI/CABG x 3 in 10/2011 with LIMA to LAD, SVG to PDA, and SVG to OM1.   . CKD (chronic kidney disease), stage III     a. stable with a creatinine around 1.9-2.0 followed by nephrology.  . Dyslipidemia   . Neck injury     a. C3-C4 and C4-C5 foraminal narrowing, severe  . Renal artery stenosis ([place])     a. 50% by cath 2001  . Pulmonary nodule     a. felt to be noncancerous.  Status post followup CT scan 4 mm and stable.  . Chronic UTI     a. Followed by Dr. Risa Grill - colonized/asymptomatic - not on abx  . Atrial fibrillation ([place])     a. Post-op from CABG, on amiodarone temporarily, d/c'd 12/2011  . Acute superficial venous thrombosis of lower extremity     a. RLE after CABG, neg dopp for DVT.  [name] Prostate cancer ([place])     a. 2001 s/p TURP.  [name]  Symptomatic bradycardia     Mobitz II AV block s/p Medtronic pacemaker 03/28/12   Past Surgical History  Procedure Laterality Date  . S/p ptca    . Cardia catherization  07-07-99  . Peripheral vascular catherization  11-24-03  . Renal circulation  10-01-03  . Stress cardiolite  05-04-05    spring 09-negative except for apical thinning, EF 68%  . Edg  07-17-1994  . Flexible sigmoidoscopy  11-03-1997  . Prostatectomy    . Lumbar spinal disk and neck fusion surgery    . Stents      X 2  . Coronary artery bypass graft  10/19/2011    Procedure: CORONARY ARTERY BYPASS GRAFTING (CABG);  Surgeon: Gaye Pollack, MD;  Location: [facility];  Service: Open Heart Surgery;  Laterality: N/A;  times three using Left Internal Mammary Artery and Right Greater Saphenouse Vein Graft harvested Endoscopically  . Cardiac surgery  10/18/12    open heart surgery  . Colonoscopy  04/12/07  . Pacemaker insertion  03/28/12    PPM implanted for mobitz II AV block  . Cataract extraction w/ intraocular lens  implant, bilateral  3/205, 06/2013    [facility]  . Left heart catheterization with coronary angiogram N/A 10/16/2011    Procedure: LEFT HEART CATHETERIZATION WITH CORONARY ANGIOGRAM;  Surgeon:  Peter M Martinique, MD;  Location: [facility] CATH LAB;  Service: Cardiovascular;  Laterality: N/A;  . Permanent pacemaker insertion N/A 03/28/2012    Procedure: PERMANENT PACEMAKER INSERTION;  Surgeon: Thompson Grayer, MD;  Location: [facility] CATH LAB;  Service: Cardiovascular;  Laterality: N/A;  . Left heart catheterization with coronary/graft angiogram N/A 03/11/2013    Procedure: LEFT HEART CATHETERIZATION WITH [name];  Surgeon: Blane Ohara, MD;  Location: [facility] CATH LAB;  Service: Cardiovascular;  Laterality: N/A;     Current Outpatient Prescriptions  Medication Sig Dispense Refill  . aspirin 325 MG buffered tablet Take 325 mg by mouth daily.      . carboxymethylcellulose (REFRESH PLUS) 0.5 % SOLN Place 1 drop into both eyes daily as  needed (dry eyes). 30 mL 1  . ergocalciferol (VITAMIN D2) 50000 UNITS capsule Take 50,000 Units by mouth every 30 (thirty) days. 15 th of each month    . folic acid (FOLVITE) 1 MG tablet Take 1 tablet (1 mg total) by mouth daily. 90 tablet 2  . KLOR-CON M10 10 MEQ tablet Take 1 tablet by mouth daily.    . metoprolol succinate (TOPROL-XL) 25 MG 24 hr tablet Take 1 tablet (25 mg total) by mouth 2 (two) times daily. 180 tablet 2  . nitroGLYCERIN (NITROSTAT) 0.4 MG SL tablet Place 1 tablet (0.4 mg total) under the tongue every 5 (five) minutes as needed for chest pain (MAX 3 TABLETS). 90 tablet 3  . potassium chloride (K-DUR) 10 MEQ tablet Take 1 tablet (10 mEq total) by mouth daily. 90 tablet 2  . simvastatin (ZOCOR) 20 MG tablet Take 1 tablet (20 mg total) by mouth daily. 90 tablet 2  . vitamin C (ASCORBIC ACID) 500 MG tablet Take 1,500 mg by mouth daily.     No current facility-administered medications for this visit.    Allergies:   Amoxicillin; Aspirin; Atarax; Ciprofloxacin; Codeine; Hydrocodone; Hydroxyzine; Macrobid; Niacin; Niacin-lovastatin er; Nitrofurantoin; Trimox; and Bactrim   Social History:  The patient  reports that he has never smoked. He has never used smokeless tobacco. He reports that he drinks alcohol. He reports that he does not use illicit drugs.   Family History:  The patient's family history includes Arthritis in his mother; Breast cancer in his other; Cancer in his brother; Coronary artery disease in his father; Heart disease in his brother; Nephritis in his brother; Other in his brother and mother; Prostate cancer in his brother. There is no history of Diabetes.    ROS:  Please see the history of present illness.   All other systems are reviewed and negative.    PHYSICAL EXAM: VS:  BP 118/62 mmHg  Pulse 67  Ht 6' (1.829 m)  Wt 168 lb 3.2 oz (76.295 kg)  BMI 22.81 kg/m2 , BMI Body mass index is 22.81 kg/(m^2). GEN: Well nourished, well developed, in no acute  distress HEENT: normal Neck: no JVD, carotid bruits, or masses Cardiac: RRR; no murmurs, rubs, or gallops,no edema  Respiratory:  clear to auscultation bilaterally, normal work of breathing GI: soft, nontender, nondistended, + BS MS: no deformity or atrophy Skin: warm and dry, device pocket is well healed Neuro:  Strength and sensation are intact Psych: euthymic mood, full affect  Device interrogation is reviewed today in detail.  See PaceArt for details.   Recent Labs: 08/31/2014: TSH 2.62 12/01/2014: ALT 13; BUN 28*; Creatinine, Ser 1.97*; Hemoglobin 14.0; Platelets 207.0; Potassium 4.4; Sodium 140    Lipid Panel  Component Value Date/Time   CHOL 108 08/31/2014 1233   TRIG 99.0 08/31/2014 1233   TRIG 107 01/16/2006 1338   HDL 32.70* 08/31/2014 1233   CHOLHDL 3 08/31/2014 1233   CHOLHDL 3.6 CALC 01/16/2006 1338   VLDL 19.8 08/31/2014 1233   LDLCALC 56 08/31/2014 1233     Wt Readings from Last 3 Encounters:  04/04/15 168 lb 3.2 oz (76.295 kg)  03/01/15 167 lb 1.9 oz (75.805 kg)  02/01/15 169 lb 12 oz (76.998 kg)    ASSESSMENT AND PLAN:  1. Mobitz II AV block Normal pacemaker function See Pace Art report  2. CAD Stable No change required today  3. HTN Stable No change required today  [facility] Return in 1 year to see me  Current medicines are reviewed at length with the patient today.   The patient does not have concerns regarding his medicines.  The following changes were made today:  none  Signed, Thompson Grayer, MD  04/04/2015 2:45 PM     [address] [place] [address] [phone] (office) [phone] (fax)

## 2015-04-04 NOTE — Patient Instructions (Signed)
Medication Instructions:  Your physician recommends that you continue on your current medications as directed. Please refer to the Current Medication list given to you today.   Labwork: None ordered   Testing/Procedures: None ordered   Follow-Up:  Your physician wants you to follow-up in: 12 months with Dr Rayann Heman Dennis Bast will receive a reminder letter in the mail two months in advance. If you don't receive a letter, please call our office to schedule the follow-up appointment.  Remote monitoring is used to monitor your Pacemaker  from home. This monitoring reduces the number of office visits required to check your device to one time per year. It allows [place] to keep an eye on the functioning of your device to ensure it is working properly. You are scheduled for a device check from home on 07/04/15. You may send your transmission at any time that day. If you have a wireless device, the transmission will be sent automatically. After your physician reviews your transmission, you will receive a postcard with your next transmission date.     Any Other Special Instructions Will Be Listed Below (If Applicable).     If you need a refill on your cardiac medications before your next appointment, please call your pharmacy.

## 2015-04-07 ENCOUNTER — Other Ambulatory Visit: Payer: Self-pay | Admitting: Emergency Medicine

## 2015-04-07 NOTE — Telephone Encounter (Signed)
Entered in error

## 2015-05-13 ENCOUNTER — Telehealth: Payer: Self-pay | Admitting: Cardiovascular Disease

## 2015-05-13 NOTE — Telephone Encounter (Signed)
We'll add him to our wait list and bring him in when there's a cancellation. thx

## 2015-05-13 NOTE — Telephone Encounter (Signed)
Notified the pt that per Dr Burt Knack, he will have Lauren add him to their wait list, and she will call him and bring him in when there's a cancellation.  Pt verbalized understanding and agrees with this plan.  Pt gracious for all the assistance provided.

## 2015-05-13 NOTE — Telephone Encounter (Signed)
New Message  Pt requested to speak w/ RN- pt called to sched next avail appt w/ Burt Knack- offered August w/ Burt Knack or earlier w/ APP- pt refused and requested to speak w/ RN- Please call back and discuss.

## 2015-05-13 NOTE — Telephone Encounter (Signed)
Pt is calling in, as instructed by Dr Burt Knack and RN at last years [facility], to schedule his one year follow-up appt with Dr Burt Knack.  Pt states that he was told that he would get a recall letter sent in the mail and call back to schedule his follow-up appt with Dr Burt Knack, 2 months in advance.  Pt states this never happened.  Pt is very upset, for he states the scheduler told him he would not be able to see Dr Burt Knack until August, or he could see an Extender now.  Pt only wants to see Dr Burt Knack, and refuses the APP option.  Apologized to the pt about not receiving a letter or call back to arrange this appt for now (May 2017).  Informed the pt however, both Lauren and Dr Burt Knack are out of the office, but I'm sure they could make some accomodation's to have him seen at a sooner date than August.  Informed the pt that I will send this message to both Dr Burt Knack and Ander Purpura for further review and recommendation on follow-up appt needed now, and to follow-up with the pt thereafter.  Pt verbalized understanding, agrees with this plan, and gracious for all the assistance provided.

## 2015-05-16 ENCOUNTER — Telehealth: Payer: Self-pay | Admitting: Cardiovascular Disease

## 2015-05-16 NOTE — Telephone Encounter (Signed)
Dr Burt Knack is in the process of adding extra time into the office on 06/08/15.  I spoke with the pt and will schedule appointment on 06/08/15 at 11:15 with Dr Burt Knack.

## 2015-05-16 NOTE — Telephone Encounter (Signed)
New message   Pt is calling for an appt in May with Dr.Cooper

## 2015-06-08 ENCOUNTER — Encounter: Payer: Self-pay | Admitting: Cardiovascular Disease

## 2015-06-08 ENCOUNTER — Ambulatory Visit (INDEPENDENT_AMBULATORY_CARE_PROVIDER_SITE_OTHER): Payer: Medicare Other | Admitting: Cardiovascular Disease

## 2015-06-08 VITALS — BP 140/60 | HR 68 | Ht 72.0 in | Wt 166.8 lb

## 2015-06-08 DIAGNOSIS — I25810 Atherosclerosis of coronary artery bypass graft(s) without angina pectoris: Secondary | ICD-10-CM | POA: Diagnosis not present

## 2015-06-08 NOTE — Progress Notes (Signed)
Cardiology Office Note Date:  06/08/2015   ID:  KRISHAY ALLERT, DOB 07-09-1926, MRN [id number]  PCP:  [name], MD  Cardiologist:  [name], MD    Chief Complaint  Patient presents with  . CAD native vessel    no cp,sob,lee,or claudication     History of Present Illness: [name] is a 82 y.o. male who presents for follow-up evaluation.  He underwent multivessel CABG in 2013 after presenting with non-ST elevation MI. He had previously undergone coronary stenting dating back to 2001. He was found to have severe three-vessel obstructive disease. Left ventricular function was normal. He was bypassed with the LIMA to LAD, vein graft obtuse marginal, and vein graft to PDA. He also required permanent pacing in 2014 after presenting with second degree type II AV block.cardiac catheterization was performed in March 2015 after an abnormal nuclear scan.this demonstrated continued patency of the LIMA to LAD graft and early graft failure of the saphenous vein grafts. The patient had collateral filling of the RCA distribution and moderate stenosis of the native left circumflex. Medical therapy was recommended. The patient is followed for CKD by Dr Justin Mend. Was told renal function stable on recent labs.  The patient continues to do well. He requests an orthopedic referral to address chronic knee pain. He has no cardiac related complaints today. He had recent labs drawn by Dr. Justin Mend and these demonstrate stability of his chronic kidney disease with creatinine of 1.9 mg/dL. Fasting blood glucose is 70, liver function tests are normal. Today, he denies symptoms of palpitations, chest pain, shortness of breath, orthopnea, PND, lower extremity edema, dizziness, or syncope.  Past Medical History  Diagnosis Date  . Hypertension     well-controlled.  [name] CAD (coronary artery disease)     a. S/P stenting to mid RCA, prox PDA 06/1999. b. NSTEMI/CABG x 3 in 10/2011 with LIMA to LAD, SVG to PDA,  and SVG to OM1.   . CKD (chronic kidney disease), stage III     a. stable with a creatinine around 1.9-2.0 followed by nephrology.  . Dyslipidemia   . Neck injury     a. C3-C4 and C4-C5 foraminal narrowing, severe  . Renal artery stenosis ([place])     a. 50% by cath 2001  . Pulmonary nodule     a. felt to be noncancerous.  Status post followup CT scan 4 mm and stable.  . Chronic UTI     a. Followed by Dr. Risa Grill - colonized/asymptomatic - not on abx  . Atrial fibrillation ([place])     a. Post-op from CABG, on amiodarone temporarily, d/c'd 12/2011  . Acute superficial venous thrombosis of lower extremity     a. RLE after CABG, neg dopp for DVT.  [name] Prostate cancer ([place])     a. 2001 s/p TURP.  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 S/p ptca    . Cardia catherization  07-07-99  . Peripheral vascular catherization  11-24-03  . Renal circulation  10-01-03  . Stress cardiolite  05-04-05    spring 09-negative except for apical thinning, EF 68%  . Edg  07-17-1994  . Flexible sigmoidoscopy  11-03-1997  . Prostatectomy    . Lumbar spinal disk and neck fusion surgery    . Stents      X 2  . Coronary artery bypass graft  10/19/2011  Procedure: CORONARY ARTERY BYPASS GRAFTING (CABG);  Surgeon: Gaye Pollack, MD;  Location: [facility];  Service: Open Heart Surgery;  Laterality: N/A;  times three using Left Internal Mammary Artery and Right Greater Saphenouse Vein Graft harvested Endoscopically  . Cardiac surgery  10/18/12    open heart surgery  . Colonoscopy  04/12/07  . Pacemaker insertion  03/28/12    PPM implanted for mobitz II AV block  . Cataract extraction w/ intraocular lens  implant, bilateral  3/205, 06/2013    [facility]  . Left heart catheterization with coronary angiogram N/A 10/16/2011    Procedure: LEFT HEART CATHETERIZATION WITH CORONARY ANGIOGRAM;  Surgeon: Peter M Martinique, MD;  Location: [facility] CATH  LAB;  Service: Cardiovascular;  Laterality: N/A;  . Permanent pacemaker insertion N/A 03/28/2012    Procedure: PERMANENT PACEMAKER INSERTION;  Surgeon: Thompson Grayer, MD;  Location: [facility] CATH LAB;  Service: Cardiovascular;  Laterality: N/A;  . Left heart catheterization with coronary/graft angiogram N/A 03/11/2013    Procedure: LEFT HEART CATHETERIZATION WITH [name];  Surgeon: Blane Ohara, MD;  Location: [facility] CATH LAB;  Service: Cardiovascular;  Laterality: N/A;    Current Outpatient Prescriptions  Medication Sig Dispense Refill  . aspirin 325 MG buffered tablet Take 325 mg by mouth daily.      . carboxymethylcellulose (REFRESH PLUS) 0.5 % SOLN Place 1 drop into both eyes daily as needed (dry eyes). 30 mL 1  . DiphenhydrAMINE HCl, Sleep, (ZZZQUIL PO) Take 1 Dose by mouth at bedtime as needed (sleep).    . ergocalciferol (VITAMIN D2) 50000 UNITS capsule Take 50,000 Units by mouth every 30 (thirty) days. 15 th of each month    . folic acid (FOLVITE) 1 MG tablet Take 1 tablet (1 mg total) by mouth daily. 90 tablet 2  . metoprolol succinate (TOPROL-XL) 25 MG 24 hr tablet Take 1 tablet (25 mg total) by mouth 2 (two) times daily. 180 tablet 2  . nitroGLYCERIN (NITROSTAT) 0.4 MG SL tablet Place 1 tablet (0.4 mg total) under the tongue every 5 (five) minutes as needed for chest pain (MAX 3 TABLETS). 90 tablet 3  . potassium chloride (K-DUR) 10 MEQ tablet Take 1 tablet (10 mEq total) by mouth daily. 90 tablet 2  . simvastatin (ZOCOR) 20 MG tablet Take 1 tablet (20 mg total) by mouth daily. 90 tablet 2  . vitamin C (ASCORBIC ACID) 500 MG tablet Take 1,500 mg by mouth daily.     No current facility-administered medications for this visit.    Allergies:   Amoxicillin; Aspirin; Atarax; Ciprofloxacin; Codeine; Hydrocodone; Hydroxyzine; Macrobid; Niacin; Niacin-lovastatin er; Nitrofurantoin; Trimox; and Bactrim   Social History:  The patient  reports that he has never smoked. He has never  used smokeless tobacco. He reports that he drinks alcohol. He reports that he does not use illicit drugs.   Family History:  The patient's  family history includes Arthritis in his mother; Breast cancer in his other; Cancer in his brother; Coronary artery disease in his father; Heart disease in his brother; Nephritis in his brother; Other in his brother and mother; Prostate cancer in his brother. There is no history of Diabetes.    ROS:  Please see the history of present illness.  Otherwise, review of systems is positive for knee pain, balance problems.  All other systems are reviewed and negative.    PHYSICAL EXAM: VS:  BP 140/60 mmHg  Pulse 68  Ht 6' (1.829 m)  Wt 166 lb 12.8 oz (75.66  kg)  BMI 22.62 kg/m2 , BMI Body mass index is 22.62 kg/(m^2). GEN: Well nourished, well developed, in no acute distress HEENT: normal Neck: no JVD, no masses. No carotid bruits Cardiac: RRR without murmur or gallop                Respiratory:  clear to auscultation bilaterally, normal work of breathing GI: soft, nontender, nondistended, + BS MS: no deformity or atrophy Ext: no pretibial edema, pedal pulses 2+= bilaterally Skin: warm and dry, no rash Neuro:  Strength and sensation are intact Psych: euthymic mood, full affect  EKG:  EKG is not ordered today.  Recent Labs: 08/31/2014: TSH 2.62 12/01/2014: ALT 13; BUN 28*; Creatinine, Ser 1.97*; Hemoglobin 14.0; Platelets 207.0; Potassium 4.4; Sodium 140   Lipid Panel     Component Value Date/Time   CHOL 108 08/31/2014 1233   TRIG 99.0 08/31/2014 1233   TRIG 107 01/16/2006 1338   HDL 32.70* 08/31/2014 1233   CHOLHDL 3 08/31/2014 1233   CHOLHDL 3.6 CALC 01/16/2006 1338   VLDL 19.8 08/31/2014 1233   LDLCALC 56 08/31/2014 1233      Wt Readings from Last 3 Encounters:  06/08/15 166 lb 12.8 oz (75.66 kg)  04/04/15 168 lb 3.2 oz (76.295 kg)  03/01/15 167 lb 1.9 oz (75.805 kg)     Cardiac Studies Reviewed: Cardiac Cath 03-11-2013: Procedural  Findings: Hemodynamics: AO 160/87 LV 160/20  Coronary angiography: Coronary dominance: right  Left mainstem: The left main arises from the left coronary cusp. There is no significant disease noted. The vessel trifurcates into the LAD, intermediate branch, and left circumflex.  Left anterior descending (LAD): The proximal LAD is patent. The first diagonal is patent. The mid LAD has diffuse 50-70% stenosis. The distal vessel fills competitively from the mammary artery.  Left circumflex (LCx): The left circumflex system is patent. There is an intermediate branch with mild 30-40% stenosis. The AV circumflex has 50-60% proximal stenosis. The obtuse marginal branches are patent without significant disease.  Right coronary artery (RCA): The vessel is moderately calcified. The mid vessel is totally occluded. There is a proximal stent that is widely patent. The vessel is occluded after the stent. The PDA fills from left to right collaterals.  Left ventriculography: Deferred  LIMA to LAD: Widely patent with no obstructive disease. There is competitive native vessel and graft flow filling the distal LAD.  Saphenous vein graft to PDA: Total occlusion of the proximal aortic anastomosis  Saphenous vein graft obtuse marginal: Total occlusion of the proximal aortic anastomosis  Final Conclusions:  1. 3 vessel coronary artery disease with total occlusion of the RCA, moderate stenosis of the LAD, and moderate stenosis of the native left circumflex  2. Status post aortocoronary bypass surgery with continued patency of the LIMA to LAD, total occlusion of the vein graft to PDA, and total occlusion of the vein graft obtuse marginal.  Recommendations: The patient has moderate nonobstructive disease of the left circumflex. His LAD is grafted with a widely patent LIMA. The circumflex is now chronically occluded with left to right collaterals. With primarily atypical chest pain symptoms, I would favor ongoing  medical therapy.  ASSESSMENT AND PLAN: 1.  CAD, native vessel and saphenous vein graft disease: Patient is stable without symptoms of angina. His medications are reviewed and will be continued without change. He is tolerating aspirin, beta blocker, and a statin drug.  2. Hyperlipidemia: Treated with simvastatin. Last lipids reviewed with a cholesterol of 108, HDL 33, LDL  56.  3. Status post permanent pacemaker: Followed by Dr. Rayann Heman. His recent office note is reviewed.  Current medicines are reviewed with the patient today.  The patient does not have concerns regarding medicines.  Labs/ tests ordered today include:  No orders of the defined types were placed in this encounter.    Disposition:   FU one year  Signed, [name], MD  06/08/2015 1:32 PM    [facility] [address], [place], [place]  [address] Phone: [phone]; Fax: [phone]

## 2015-06-08 NOTE — Patient Instructions (Signed)
Medication Instructions:  Your physician recommends that you continue on your current medications as directed. Please refer to the Current Medication list given to you today.  Labwork: No new orders.   Testing/Procedures: No new orders.   Follow-Up: Your physician wants you to follow-up in: 1 YEAR with Dr Burt Knack. You will receive a reminder letter in the mail two months in advance. If you don't receive a letter, please call our office to schedule the follow-up appointment.   Any Other Special Instructions Will Be Listed Below (If Applicable).  [facility] [id number] Dr Gaynelle Arabian or Dr Paralee Cancel     If you need a refill on your cardiac medications before your next appointment, please call your pharmacy.

## 2015-06-14 ENCOUNTER — Encounter: Payer: Self-pay | Admitting: Cardiovascular Disease

## 2015-07-04 ENCOUNTER — Ambulatory Visit (INDEPENDENT_AMBULATORY_CARE_PROVIDER_SITE_OTHER): Payer: Medicare Other | Admitting: *Deleted

## 2015-07-04 DIAGNOSIS — R001 Bradycardia, unspecified: Secondary | ICD-10-CM | POA: Diagnosis not present

## 2015-07-04 DIAGNOSIS — I441 Atrioventricular block, second degree: Secondary | ICD-10-CM

## 2015-07-04 NOTE — Progress Notes (Signed)
Remote pacemaker transmission.   

## 2015-07-05 LAB — CUP PACEART REMOTE DEVICE CHECK
Battery Impedance: 184 Ohm
Battery Remaining Longevity: 133 mo
Battery Voltage: 2.79 V
Brady Statistic AP VP Percent: 0 %
Brady Statistic AP VS Percent: 42 %
Brady Statistic AS VP Percent: 0 %
Implantable Lead Implant Date: 20140321
Implantable Lead Location: 753859
Implantable Lead Model: 5092
Lead Channel Impedance Value: 452 Ohm
Lead Channel Impedance Value: 621 Ohm
Lead Channel Pacing Threshold Amplitude: 0.625 V
Lead Channel Pacing Threshold Pulse Width: 0.4 ms
Lead Channel Pacing Threshold Pulse Width: 0.4 ms
Lead Channel Setting Pacing Amplitude: 2 V
Lead Channel Setting Pacing Amplitude: 2.5 V
MDC IDC LEAD IMPLANT DT: 20140321
MDC IDC LEAD LOCATION: 753860
MDC IDC MSMT LEADCHNL RA SENSING INTR AMPL: 2.8 mV
MDC IDC MSMT LEADCHNL RV PACING THRESHOLD AMPLITUDE: 0.875 V
MDC IDC MSMT LEADCHNL RV SENSING INTR AMPL: 16 mV
MDC IDC SESS DTM: 20170626151931
MDC IDC SET LEADCHNL RV PACING PULSEWIDTH: 0.4 ms
MDC IDC SET LEADCHNL RV SENSING SENSITIVITY: 5.6 mV
MDC IDC STAT BRADY AS VS PERCENT: 58 %

## 2015-07-06 ENCOUNTER — Encounter: Payer: Self-pay | Admitting: Cardiology

## 2015-07-13 ENCOUNTER — Telehealth: Payer: Self-pay | Admitting: Cardiovascular Disease

## 2015-07-13 NOTE — Telephone Encounter (Signed)
Pt wants to see Dr. Burt Knack because he has a headache and thinks he may have had a stroke-wife said he is not forming words right- pls advise

## 2015-07-13 NOTE — Telephone Encounter (Signed)
Patient Name: [name]  DOB: Feb 10, 1926    Initial Comment Caller states was on the phone with his cardiologist. Cardiologist told him to call the office. Dull headaches for the last couple of weeks and mouth is drooping towards the left. Not all the time but sometimes.    Nurse Assessment  Nurse: Christel Mormon, RN, Levada Dy Date/Time (Eastern Time): 07/13/2015 4:10:18 PM  Confirm and document reason for call. If symptomatic, describe symptoms. You must click the next button to save text entered. ---Caller states was on the phone with his cardiologist. Cardiologist told him to call the office. He has been having dull headaches for the last couple of weeks. On/off his mouth make a "motion going up toward the ear". "the corner of the mouth moves up toward the left eye as I am sleeping". NO weakness or numbness on one side of the body but is feeling tired after not sleeping too well the past couple of weeks. The headache is a dull ache.  Has the patient traveled out of the country within the last 30 days? ---No  Does the patient have any new or worsening symptoms? ---Yes  Will a triage be completed? ---Yes  Related visit to physician within the last 2 weeks? ---No  Does the PT have any chronic conditions? (i.e. diabetes, asthma, etc.) ---Yes  List chronic conditions. ---CABG/tribple bypass, pacemaker, 2 cardiac stents, prostatectomy, HTN, high cholesterol,  Is this a behavioral health or substance abuse call? ---No     Guidelines    Guideline Title Affirmed Question Affirmed Notes  Headache [1] MODERATE headache (e.g., interferes with normal activities) AND [2] present > 24 hours AND [3] unexplained (Exceptions: analgesics not tried, typical migraine, or headache part of viral illness)    Final Disposition User   See Physician within [name], RN, Photographer    Referrals  REFERRED TO PCP OFFICE   Disagree/Comply: Leta Baptist

## 2015-07-13 NOTE — Telephone Encounter (Signed)
I spoke with the pt and he said his wife noticed that his mouth drifts off to one side one week ago. The pt does not feel like he is having difficulty putting his words together.   The pt would like to schedule an appointment with Dr Burt Knack to determine whether he had a stroke. I made the pt aware that he needs to contact his PCP and arrange further evaluation.  Pt agreed with plan.

## 2015-07-14 ENCOUNTER — Ambulatory Visit (INDEPENDENT_AMBULATORY_CARE_PROVIDER_SITE_OTHER)
Admission: RE | Admit: 2015-07-14 | Discharge: 2015-07-14 | Disposition: A | Discharge status: Home / Self Care | Payer: Medicare Other | Source: Ambulatory Visit | Attending: Internal Medicine | Admitting: Internal Medicine

## 2015-07-14 ENCOUNTER — Ambulatory Visit (INDEPENDENT_AMBULATORY_CARE_PROVIDER_SITE_OTHER): Payer: Medicare Other | Admitting: Internal Medicine

## 2015-07-14 ENCOUNTER — Encounter: Payer: Self-pay | Admitting: Internal Medicine

## 2015-07-14 VITALS — BP 150/70 | HR 67 | Temp 98.5°F | Resp 16 | Wt 165.0 lb

## 2015-07-14 DIAGNOSIS — Z8546 Personal history of malignant neoplasm of prostate: Secondary | ICD-10-CM

## 2015-07-14 DIAGNOSIS — R51 Headache: Secondary | ICD-10-CM

## 2015-07-14 DIAGNOSIS — I25810 Atherosclerosis of coronary artery bypass graft(s) without angina pectoris: Secondary | ICD-10-CM | POA: Diagnosis not present

## 2015-07-14 DIAGNOSIS — R519 Headache, unspecified: Secondary | ICD-10-CM

## 2015-07-14 DIAGNOSIS — I10 Essential (primary) hypertension: Secondary | ICD-10-CM

## 2015-07-14 HISTORY — DX: Headache, unspecified: R51.9

## 2015-07-14 NOTE — Assessment & Plan Note (Signed)
He has had daily headaches for the past 3 weeks without obvious cause His blood pressure is elevated here today, but typically has been very well controlled. No change in medication today. He will start monitoring his medication daily at home He has a family history of cerebral hemorrhage and that is a concern given that he is on full aspirin-stat CT of the head ordered to rule out mass or hemorrhage His poor sleep, fatigue or not helping and may be contributing to the headaches. We'll discuss further treatment if needed for his sleep once other causs ar ruled out He had recent lab work done just over 1 month ago-reviewed and was all stable. We'll hold off on repeating for now

## 2015-07-14 NOTE — Assessment & Plan Note (Signed)
Following with urology No evidence of recurrence

## 2015-07-14 NOTE — Patient Instructions (Signed)
A Ct scan of your head was ordered.  We will call you with the result as soon as we get it.

## 2015-07-14 NOTE — Progress Notes (Signed)
Subjective:    Patient ID: [name], male    DOB: 1926-05-04, 82 y.o.   MRN: [id number]  HPI  He is here for an acute visit.   He does not typically have headaches - his last headache was 30-40 years ago.    Headaches:  He has had daily headaches for about three weeks.  The headaches are intermittent.  The pain is in a band around his head and is dull.  It is more of a Nuisance than anything.  His wife noticed the left side of his mouth goes up when he talks sometimes.  He has taken aspirin and it does relieve the headache temporarily.  He denies any falls or other changes in lifestyle, medication over the past month or so.  He has not been sleeping. His is lucky if he gets four hours at night.  He does take z-quil sometimes.  The sleep problems are not new, but he wonders if this is contributing to fatigue and headaches. The past 6 months have been incredibly stressful, but he feels that he handles stress well and does not feel anxious or stressed.  He had a cervical fusion.  He has decreased movement in his neck.  For about one year he has heard clicking in his neck with certain movements.    He does check his BP at home on occasion and his BP has always been well controlled.  He is taking his medication daily as prescribed.     Medications and allergies reviewed with patient and updated if appropriate.  Patient Active Problem List   Diagnosis Date Noted  . Constipation 03/01/2015  . Cough 02/01/2015  . Otitis media with effusion 03/08/2014  . Acute upper respiratory infection 03/08/2014  . Ceruminosis 03/08/2014  . Claudication of left lower extremity ([place]) 03/02/2014  . Chest pain with moderate risk for cardiac etiology 01/30/2013  . 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 Symptomatic bradycardia 03/26/2012  . Pulmonary nodule   . CAD - CABG Oct 2013   . Chronic renal impairment, stage 3 (moderate)   . Venous insufficiency of leg 06/05/2010    . PERSISTENT DISORDER INITIATING/MAINTAINING SLEEP 11/09/2009  . SHOULDER PAIN, RIGHT, CHRONIC 10/14/2009  . B12 nutritional deficiency 08/23/2009  . Peripheral neuropathy ([place]) 03/21/2009  . DEGENERATIVE DISC DISEASE, CERVICAL SPINE, W/RADICULOPATHY 09/21/2008  . Dyslipidemia 01/17/2007  . Essential hypertension 07/20/2006  . PROSTATE CANCER, HX OF- surgery 2001 07/20/2006    Current Outpatient Prescriptions on File Prior to Visit  Medication Sig Dispense Refill  . aspirin 325 MG buffered tablet Take 325 mg by mouth daily.      . carboxymethylcellulose (REFRESH PLUS) 0.5 % SOLN Place 1 drop into both eyes daily as needed (dry eyes). 30 mL 1  . DiphenhydrAMINE HCl, Sleep, (ZZZQUIL PO) Take 1 Dose by mouth at bedtime as needed (sleep).    . ergocalciferol (VITAMIN D2) 50000 UNITS capsule Take 50,000 Units by mouth every 30 (thirty) days. 15 th of each month    . folic acid (FOLVITE) 1 MG tablet Take 1 tablet (1 mg total) by mouth daily. 90 tablet 2  . metoprolol succinate (TOPROL-XL) 25 MG 24 hr tablet Take 1 tablet (25 mg total) by mouth 2 (two) times daily. 180 tablet 2  . nitroGLYCERIN (NITROSTAT) 0.4 MG SL tablet Place 1 tablet (0.4 mg total) under the tongue every 5 (five) minutes as needed for chest pain (MAX 3 TABLETS). 90 tablet  3  . potassium chloride (K-DUR) 10 MEQ tablet Take 1 tablet (10 mEq total) by mouth daily. 90 tablet 2  . simvastatin (ZOCOR) 20 MG tablet Take 1 tablet (20 mg total) by mouth daily. 90 tablet 2  . vitamin C (ASCORBIC ACID) 500 MG tablet Take 1,500 mg by mouth daily.     No current facility-administered medications on file prior to visit.    Past Medical History  Diagnosis Date  . Hypertension     well-controlled.  [name] CAD (coronary artery disease)     a. S/P stenting to mid RCA, prox PDA 06/1999. b. NSTEMI/CABG x 3 in 10/2011 with LIMA to LAD, SVG to PDA, and SVG to OM1.   . CKD (chronic kidney disease), stage III     a. stable with a creatinine around  1.9-2.0 followed by nephrology.  . Dyslipidemia   . Neck injury     a. C3-C4 and C4-C5 foraminal narrowing, severe  . Renal artery stenosis ([place])     a. 50% by cath 2001  . Pulmonary nodule     a. felt to be noncancerous.  Status post followup CT scan 4 mm and stable.  . Chronic UTI     a. Followed by Dr. Risa Grill - colonized/asymptomatic - not on abx  . Atrial fibrillation ([place])     a. Post-op from CABG, on amiodarone temporarily, d/c'd 12/2011  . Acute superficial venous thrombosis of lower extremity     a. RLE after CABG, neg dopp for DVT.  [name] Prostate cancer ([place])     a. 2001 s/p TURP.  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 S/p ptca    . Cardia catherization  07-07-99  . Peripheral vascular catherization  11-24-03  . Renal circulation  10-01-03  . Stress cardiolite  05-04-05    spring 09-negative except for apical thinning, EF 68%  . Edg  07-17-1994  . Flexible sigmoidoscopy  11-03-1997  . Prostatectomy    . Lumbar spinal disk and neck fusion surgery    . Stents      X 2  . Coronary artery bypass graft  10/19/2011    Procedure: CORONARY ARTERY BYPASS GRAFTING (CABG);  Surgeon: Gaye Pollack, MD;  Location: [place];  Service: Open Heart Surgery;  Laterality: N/A;  times three using Left Internal Mammary Artery and Right Greater Saphenouse Vein Graft harvested Endoscopically  . Cardiac surgery  10/18/12    open heart surgery  . Colonoscopy  04/12/07  . Pacemaker insertion  03/28/12    PPM implanted for mobitz II AV block  . Cataract extraction w/ intraocular lens  implant, bilateral  3/205, 06/2013    [facility]uen  . Left heart catheterization with coronary angiogram N/A 10/16/2011    Procedure: LEFT HEART CATHETERIZATION WITH CORONARY ANGIOGRAM;  Surgeon: Peter M Martinique, MD;  Location: [facility] CATH LAB;  Service: Cardiovascular;  Laterality: N/A;  . Permanent pacemaker insertion N/A 03/28/2012     Procedure: PERMANENT PACEMAKER INSERTION;  Surgeon: Thompson Grayer, MD;  Location: [facility] CATH LAB;  Service: Cardiovascular;  Laterality: N/A;  . Left heart catheterization with coronary/graft angiogram N/A 03/11/2013    Procedure: LEFT HEART CATHETERIZATION WITH [name];  Surgeon: Blane Ohara, MD;  Location: [facility] CATH LAB;  Service: Cardiovascular;  Laterality: N/A;    Social History   Social History  . Marital Status: Married  Spouse Name: N/A  . Number of Children: 2  . Years of Education: N/A   Occupational History  . 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     Social History Main Topics  . Smoking status: Never Smoker   . Smokeless tobacco: Never Used  . Alcohol Use: Yes     Comment: Rarely  . Drug Use: No  . Sexual Activity: Not Asked   Other Topics Concern  . None   Social History Narrative   HSG, 1 year college.  married '52 - 3 years, divorced; married '56 - 3 years divorced; married '43-12 yrs - divorced; married '75 -. 1 son '57; 1 daughter - '53; 1 grandchild.  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  Very happily married.  End of life care: yes CPR, no long term mechanical ventilation, no heroic measures.    Family History  Problem Relation Age of Onset  . Coronary artery disease Father     died @ 71  . Cancer Brother     Bladder  . Diabetes Neg Hx   . Prostate cancer Brother   . Nephritis Brother     died @ age 14.  . Other Brother     cerebral hemorrhage - died @ 30  . Other Mother     cerebral hemorrhage - died @ 21  . Heart disease Brother   . Arthritis Mother   . Breast cancer Other     niece x 2    Review of Systems  Constitutional: Positive for fatigue and unexpected weight change (few pounds). Negative for fever, chills and appetite change.  HENT: Negative for congestion, sore throat and trouble  swallowing.   Eyes: Negative for visual disturbance.  Respiratory: Positive for shortness of breath (rare). Negative for cough and wheezing.   Cardiovascular: Negative for chest pain, palpitations and leg swelling.  Gastrointestinal: Negative for nausea and abdominal pain.  Genitourinary:       Denies change in urination  Neurological: Positive for light-headedness (with quickly turning head), numbness (neuropathy in feet - chronic) and headaches.  Psychiatric/Behavioral: Positive for sleep disturbance.       Objective:   Filed Vitals:   07/14/15 0922  BP: 150/70  Pulse: 67  Temp: 98.5 F (36.9 C)  Resp: 16   Filed Weights   07/14/15 0922  Weight: 165 lb (74.844 kg)   Body mass index is 22.37 kg/(m^2).   Physical Exam  Constitutional: He is oriented to person, place, and time. He appears well-developed and well-nourished. No distress.  HENT:  Head: Normocephalic and atraumatic.  Right Ear: External ear normal.  Left Ear: External ear normal.  Mouth/Throat: Oropharynx is clear and moist.  Minimal dry wax bilaterally ear canals, tympanic membranes normal bilaterally  Eyes: Conjunctivae and EOM are normal.  Neck: Neck supple. No tracheal deviation present. No thyromegaly present.  Cardiovascular: Normal rate, regular rhythm and normal heart sounds.   Pulmonary/Chest: Effort normal and breath sounds normal. No respiratory distress. He has no wheezes. He has no rales.  Musculoskeletal: He exhibits no edema.  Lymphadenopathy:    He has no cervical adenopathy.  Neurological: He is alert and oriented to person, place, and time. No cranial nerve deficit. He exhibits normal muscle tone. Coordination normal.  Normal sensation and strength all extremities  Skin: Skin is  warm and dry. He is not diaphoretic.  Psychiatric: He has a normal mood and affect. His behavior is normal.          Assessment & Plan:   See Problem List for Assessment and Plan of chronic medical  problems.

## 2015-07-14 NOTE — Progress Notes (Signed)
Pre visit review using our clinic review tool, if applicable. No additional management support is needed unless otherwise documented below in the visit note. 

## 2015-07-14 NOTE — Assessment & Plan Note (Signed)
His blood pressure is elevated here today, but he states it has been well controlled at home He will start monitoring his blood pressure regularly Continue current medication

## 2015-07-15 ENCOUNTER — Telehealth: Payer: Self-pay | Admitting: Emergency Medicine

## 2015-07-15 NOTE — Telephone Encounter (Signed)
Pt called to report Bp readings.   Yesterday AM 141/75, 115/64, PM 123/70  Today AM 139/77, PM 128/70, 133/76

## 2015-07-15 NOTE — Telephone Encounter (Signed)
BP looks very good - goal is less than 150/90.  Is he still having headaches?  [name] you mentioned he wanted a medication sent to his pharmacy (not on amlodipine)

## 2015-07-18 NOTE — Telephone Encounter (Signed)
Spoke with pt, states he is still having headaches, but it is not an every day thing. States he only had a couple over the weekend. States that he was not able to get much sleep last night and was not able to go to rehab this morning.   The amlodipine was a different pt, sorry.

## 2015-07-18 NOTE — Telephone Encounter (Signed)
Has he ever tried trazodone for sleep?  We can try that - if he is able to sleep better it may improve his headaches.  His BP looks good.

## 2015-07-19 MED ORDER — TRAZODONE HCL 50 MG PO TABS: 25.0000 mg | ORAL_TABLET | Freq: Every evening | ORAL | Status: DC | PRN

## 2015-07-19 NOTE — Telephone Encounter (Signed)
It will not affect his kidneys. rx sent to [facility].  Let him know dose can be adjusted - we will start with the lowest dose.

## 2015-07-19 NOTE — Telephone Encounter (Signed)
Pt would like to verify that Trazodone will not effect his kidneys.

## 2015-07-19 NOTE — Telephone Encounter (Signed)
Spoke with pt, he is okay with trying Trazodone. Please send to [facility]

## 2015-07-20 ENCOUNTER — Telehealth: Payer: Self-pay | Admitting: Emergency Medicine

## 2015-07-20 NOTE — Telephone Encounter (Signed)
Spoke with pt to inform.  

## 2015-07-20 NOTE — Telephone Encounter (Signed)
Pt called in and would like to know if he should continue to go to rehab. Please advise.

## 2015-07-20 NOTE — Telephone Encounter (Signed)
If it is beneficial - yes

## 2015-08-20 ENCOUNTER — Other Ambulatory Visit: Payer: Self-pay | Admitting: Internal Medicine

## 2015-09-01 ENCOUNTER — Other Ambulatory Visit (INDEPENDENT_AMBULATORY_CARE_PROVIDER_SITE_OTHER): Payer: Medicare Other

## 2015-09-01 ENCOUNTER — Encounter: Payer: Self-pay | Admitting: Internal Medicine

## 2015-09-01 ENCOUNTER — Ambulatory Visit (INDEPENDENT_AMBULATORY_CARE_PROVIDER_SITE_OTHER): Payer: Medicare Other | Admitting: Internal Medicine

## 2015-09-01 ENCOUNTER — Encounter: Payer: Medicare Other | Admitting: Internal Medicine

## 2015-09-01 VITALS — BP 130/82 | HR 58 | Temp 97.9°F | Resp 16 | Ht 72.0 in | Wt 165.0 lb

## 2015-09-01 DIAGNOSIS — R51 Headache: Secondary | ICD-10-CM

## 2015-09-01 DIAGNOSIS — I10 Essential (primary) hypertension: Secondary | ICD-10-CM

## 2015-09-01 DIAGNOSIS — E538 Deficiency of other specified B group vitamins: Secondary | ICD-10-CM

## 2015-09-01 DIAGNOSIS — Z8546 Personal history of malignant neoplasm of prostate: Secondary | ICD-10-CM

## 2015-09-01 DIAGNOSIS — E785 Hyperlipidemia, unspecified: Secondary | ICD-10-CM

## 2015-09-01 DIAGNOSIS — N183 Chronic kidney disease, stage 3 unspecified: Secondary | ICD-10-CM

## 2015-09-01 DIAGNOSIS — R519 Headache, unspecified: Secondary | ICD-10-CM

## 2015-09-01 DIAGNOSIS — I25810 Atherosclerosis of coronary artery bypass graft(s) without angina pectoris: Secondary | ICD-10-CM | POA: Diagnosis not present

## 2015-09-01 LAB — COMPREHENSIVE METABOLIC PANEL
ALBUMIN: 4.7 g/dL (ref 3.5–5.2)
ALT: 11 U/L (ref 0–53)
AST: 16 U/L (ref 0–37)
Alkaline Phosphatase: 58 U/L (ref 39–117)
BUN: 28 mg/dL — AB (ref 6–23)
CALCIUM: 9.5 mg/dL (ref 8.4–10.5)
CHLORIDE: 106 meq/L (ref 96–112)
CO2: 27 meq/L (ref 19–32)
CREATININE: 1.83 mg/dL — AB (ref 0.40–1.50)
GFR: 37.39 mL/min — ABNORMAL LOW (ref >60.00)
Glucose, Bld: 95 mg/dL (ref 70–99)
POTASSIUM: 4.8 meq/L (ref 3.5–5.1)
SODIUM: 139 meq/L (ref 135–145)
Total Bilirubin: 1.3 mg/dL — ABNORMAL HIGH (ref 0.2–1.2)
Total Protein: 7.8 g/dL (ref 6.0–8.3)

## 2015-09-01 LAB — LIPID PANEL
CHOLESTEROL: 136 mg/dL (ref 0–200)
HDL: 41.4 mg/dL (ref >39.00)
LDL Cholesterol: 70 mg/dL (ref 0–99)
NonHDL: 95.03
Total CHOL/HDL Ratio: 3
Triglycerides: 124 mg/dL (ref 0.0–149.0)
VLDL: 24.8 mg/dL (ref 0.0–40.0)

## 2015-09-01 LAB — CBC WITH DIFFERENTIAL/PLATELET
Basophils Absolute: 0 10*3/uL (ref 0.0–0.1)
Basophils Relative: 0.3 % (ref 0.0–3.0)
EOS PCT: 2.2 % (ref 0.0–5.0)
Eosinophils Absolute: 0.2 10*3/uL (ref 0.0–0.7)
HCT: 45.1 % (ref 39.0–52.0)
Hemoglobin: 14.7 g/dL (ref 13.0–17.0)
LYMPHS ABS: 1.3 10*3/uL (ref 0.7–4.0)
Lymphocytes Relative: 17.4 % (ref 12.0–46.0)
MCHC: 32.6 g/dL (ref 30.0–36.0)
MCV: 78 fl (ref 78.0–100.0)
MONOS PCT: 6.6 % (ref 3.0–12.0)
Monocytes Absolute: 0.5 10*3/uL (ref 0.1–1.0)
NEUTROS ABS: 5.4 10*3/uL (ref 1.4–7.7)
NEUTROS PCT: 73.5 % (ref 43.0–77.0)
PLATELETS: 222 10*3/uL (ref 150.0–400.0)
RBC: 5.78 Mil/uL (ref 4.22–5.81)
RDW: 15.9 % — AB (ref 11.5–15.5)
WBC: 7.4 10*3/uL (ref 4.0–10.5)

## 2015-09-01 LAB — TSH: TSH: 2.55 u[IU]/mL (ref 0.35–4.50)

## 2015-09-01 LAB — SEDIMENTATION RATE: Sed Rate: 5 mm/hr (ref 0–20)

## 2015-09-01 LAB — C-REACTIVE PROTEIN: CRP: 0.2 mg/dL — ABNORMAL LOW (ref 0.5–20.0)

## 2015-09-01 LAB — VITAMIN B12: Vitamin B-12: 258 pg/mL (ref 211–911)

## 2015-09-01 NOTE — Assessment & Plan Note (Signed)
Check B12 level. 

## 2015-09-01 NOTE — Progress Notes (Signed)
Subjective:    Patient ID: [name], male    DOB: 1926-11-10, 82 y.o.   MRN: [id number]  HPI  He is here for follow up.  Headaches:  Still having headaches.  He will have a headache occur 1-4 times a day. He will get at least one headache a day.  The headaches are dull, around his head and it bothers him in the back of her head/neck.  The pain startes in the frontal region and radiates to the back.  Since he was here last he has had 4 otc medication.   He did have a blood shot eye at one point - he saw his eye doctor and was told it was related to his aspirin.  He feels times lightheaded/dizzy which comes with the headaches or bending over.    His mouth going upward when talking is getting worse.  His wife notices this and he does not know he is doing it.  He does it when he talks and it occurs randomly.  He feels exhausted first thing in the morning, no matter how much sleep he gets.   Knee OA:  He saw orthopedics.  He offered him steroid injections in his knee, but he does not feel he needs this at the time.    CAD, Hypertension: He is taking his medication daily. He is compliant with a low sodium diet.  He denies chest pain, palpitations, edema, shortness of breath.    B12 def:  He takes B12 every other day.   Hyperlipidemia: He is taking his medication daily. He is compliant with a low fat/cholesterol diet. He is not currently exercising regularly. He denies myalgias.    Medications and allergies reviewed with patient and updated if appropriate.  Patient Active Problem List   Diagnosis Date Noted  . Cephalalgia 07/14/2015  . Constipation 03/01/2015  . Cough 02/01/2015  . Otitis media with effusion 03/08/2014  . Acute upper respiratory infection 03/08/2014  . Ceruminosis 03/08/2014  . Claudication of left lower extremity ([place]) 03/02/2014  . Chest pain with moderate risk for cardiac etiology 01/30/2013  . 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  Symptomatic bradycardia 03/26/2012  . Pulmonary nodule   . CAD - CABG Oct 2013   . Chronic renal impairment, stage 3 (moderate)   . Venous insufficiency of leg 06/05/2010  . PERSISTENT DISORDER INITIATING/MAINTAINING SLEEP 11/09/2009  . SHOULDER PAIN, RIGHT, CHRONIC 10/14/2009  . B12 nutritional deficiency 08/23/2009  . Peripheral neuropathy ([place]) 03/21/2009  . DEGENERATIVE DISC DISEASE, CERVICAL SPINE, W/RADICULOPATHY 09/21/2008  . Dyslipidemia 01/17/2007  . Essential hypertension 07/20/2006  . PROSTATE CANCER, HX OF- surgery 2001 07/20/2006    Current Outpatient Prescriptions on File Prior to Visit  Medication Sig Dispense Refill  . aspirin 325 MG buffered tablet Take 325 mg by mouth daily.      . carboxymethylcellulose (REFRESH PLUS) 0.5 % SOLN Place 1 drop into both eyes daily as needed (dry eyes). 30 mL 1  . DiphenhydrAMINE HCl, Sleep, (ZZZQUIL PO) Take 1 Dose by mouth at bedtime as needed (sleep).    . ergocalciferol (VITAMIN D2) 50000 UNITS capsule Take 50,000 Units by mouth every 30 (thirty) days. 15 th of each month    . folic acid (FOLVITE) 1 MG tablet Take 1 tablet (1 mg total) by mouth daily. 90 tablet 0  . KLOR-CON M10 10 MEQ tablet TAKE 1 TABLET DAILY 90 tablet 0  . metoprolol succinate (TOPROL XL) 25  MG 24 hr tablet Take 1 tablet (25 mg total) by mouth 2 (two) times daily. 180 tablet 0  . Multiple Vitamins-Minerals (PRESERVISION AREDS PO) Take by mouth daily.    . nitroGLYCERIN (NITROSTAT) 0.4 MG SL tablet Place 1 tablet (0.4 mg total) under the tongue every 5 (five) minutes as needed for chest pain (MAX 3 TABLETS). 90 tablet 3  . simvastatin (ZOCOR) 20 MG tablet TAKE 1 TABLET DAILY 90 tablet 0  . vitamin B-12 (CYANOCOBALAMIN) 500 MCG tablet Take 500 mcg by mouth daily. Take one tablet every 3rd day    . vitamin C (ASCORBIC ACID) 500 MG tablet Take 1,500 mg by mouth daily.     No current facility-administered medications on file prior to visit.     Past Medical  History:  Diagnosis Date  . Acute superficial venous thrombosis of lower extremity    a. RLE after CABG, neg dopp for DVT.  [name] Atrial fibrillation ([place])    a. Post-op from CABG, on amiodarone temporarily, d/c'd 12/2011  . CAD (coronary artery disease)    a. S/P stenting to mid RCA, prox PDA 06/1999. b. NSTEMI/CABG x 3 in 10/2011 with LIMA to LAD, SVG to PDA, and SVG to OM1.   . Chronic UTI    a. Followed by Dr. Risa Grill - colonized/asymptomatic - not on abx  . CKD (chronic kidney disease), stage III    a. stable with a creatinine around 1.9-2.0 followed by nephrology.  . Dyslipidemia   . Hypertension    well-controlled.  . Neck injury    a. C3-C4 and C4-C5 foraminal narrowing, severe  . Prostate cancer ([place])    a. 2001 s/p TURP.  . Pulmonary nodule    a. felt to be noncancerous.  Status post followup CT scan 4 mm and stable.  . Renal artery stenosis ([place])    a. 50% by cath 2001  . 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 cardia catherization  07-07-99  . CARDIAC SURGERY  10/18/12   open heart surgery  . CATARACT EXTRACTION W/ INTRAOCULAR LENS  IMPLANT, BILATERAL  3/205, 06/2013   m[facility]uen  . COLONOSCOPY  04/12/07  . CORONARY ARTERY BYPASS GRAFT  10/19/2011   Procedure: CORONARY ARTERY BYPASS GRAFTING (CABG);  Surgeon: Gaye Pollack, MD;  Location: [place];  Service: Open Heart Surgery;  Laterality: N/A;  times three using Left Internal Mammary Artery and Right Greater Saphenouse Vein Graft harvested Endoscopically  . edg  07-17-1994  . FLEXIBLE SIGMOIDOSCOPY  11-03-1997  . LEFT HEART CATHETERIZATION WITH CORONARY ANGIOGRAM N/A 10/16/2011   Procedure: LEFT HEART CATHETERIZATION WITH CORONARY ANGIOGRAM;  Surgeon: Peter M Martinique, MD;  Location: [facility] CATH LAB;  Service: Cardiovascular;  Laterality: N/A;  . LEFT HEART CATHETERIZATION WITH CORONARY/GRAFT ANGIOGRAM N/A 03/11/2013   Procedure: LEFT HEART  CATHETERIZATION WITH [name];  Surgeon: Blane Ohara, MD;  Location: [facility] CATH LAB;  Service: Cardiovascular;  Laterality: N/A;  . lumbar spinal disk and neck fusion surgery    . PACEMAKER INSERTION  03/28/12   PPM implanted for mobitz II AV block  . peripheral vascular catherization  11-24-03  . PERMANENT PACEMAKER INSERTION N/A 03/28/2012   Procedure: PERMANENT PACEMAKER INSERTION;  Surgeon: Thompson Grayer, MD;  Location: [facility] CATH LAB;  Service: Cardiovascular;  Laterality: N/A;  . PROSTATECTOMY    . renal circulation  10-01-03  . s/p ptca    .  stents     X 2  . stress cardiolite  05-04-05   spring 09-negative except for apical thinning, EF 68%    Social History   Social History  . Marital status: Married    Spouse name: N/A  . Number of children: 2  . Years of education: N/A   Occupational History  . 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  Retired   Social History Main Topics  . Smoking status: Never Smoker  . Smokeless tobacco: Never Used  . Alcohol use Yes     Comment: Rarely  . Drug use: No  . Sexual activity: Not on file   Other Topics Concern  . Not on file   Social History Narrative   HSG, 1 year college.  married '52 - 3 years, divorced; married '56 - 3 years divorced; married '33-12 yrs - divorced; married '75 -. 1 son '57; 1 daughter - '53; 1 grandchild.  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  Very happily married.  End of life care: yes CPR, no long term mechanical ventilation, no heroic measures.    Family History  Problem Relation Age of Onset  . Coronary artery disease Father     died @ 5  . Cancer Brother     Bladder  . Diabetes Neg Hx   . Prostate cancer Brother   . Nephritis Brother     died @ age 56.  . Other Brother     cerebral hemorrhage - died @ 67  . Other Mother     cerebral hemorrhage - died @  72  . Heart disease Brother   . Arthritis Mother   . Breast cancer Other     niece x 2    Review of Systems  Constitutional: Negative for fatigue and unexpected weight change.  Respiratory: Positive for shortness of breath (activity related - intermittent - stable). Negative for cough and wheezing.   Cardiovascular: Negative for chest pain, palpitations and leg swelling.  Gastrointestinal: Negative for abdominal pain and nausea.       No gerd  Musculoskeletal: Positive for neck stiffness. Negative for neck pain.  Neurological: Positive for dizziness, light-headedness, numbness (neuropathy - stable) and headaches.       Mouth curve upward when speaking intermittently       Objective:   Vitals:   09/01/15 1122  BP: 130/82  Pulse: (!) 58  Resp: 16  Temp: 97.9 F (36.6 C)   Filed Weights   09/01/15 1122  Weight: 165 lb (74.8 kg)   Body mass index is 22.38 kg/m.   Physical Exam Constitutional: He appears well-developed and well-nourished. No distress.  HENT:  Head: Normocephalic and atraumatic.  Right Ear: External ear normal.  Left Ear: External ear normal.  Mouth/Throat: Oropharynx is clear and moist.  Normal ear canals and TM b/l  Eyes: Conjunctivae and EOM are normal.  Neck: Neck supple. No tracheal deviation present. No thyromegaly present.  No carotid bruit  Cardiovascular: Normal rate, regular rhythm, normal heart sounds and intact distal pulses.   No murmur heard. Pulmonary/Chest: Effort normal and breath sounds normal. No respiratory distress. He has no wheezes. He has no rales.  Abdominal: Soft. He exhibits no distension. There is no tenderness.  Musculoskeletal: He exhibits no edema.  Lymphadenopathy:   He has no cervical  adenopathy.  Skin: Skin is warm and dry. He is not diaphoretic.  Psychiatric: He has a normal mood and affect. His behavior is normal.       Assessment & Plan:   See Problem List for Assessment and Plan of chronic medical  problems.

## 2015-09-01 NOTE — Assessment & Plan Note (Signed)
Check lipid panel  

## 2015-09-01 NOTE — Progress Notes (Signed)
Pre visit review using our clinic review tool, if applicable. No additional management support is needed unless otherwise documented below in the visit note. 

## 2015-09-01 NOTE — Assessment & Plan Note (Signed)
BP well controlled Current regimen effective and well tolerated Continue current medications at current doses  

## 2015-09-01 NOTE — Assessment & Plan Note (Signed)
Check cmp 

## 2015-09-01 NOTE — Assessment & Plan Note (Signed)
Persistent daily headaches without obvious cause Will refer to neurology for further evaluation Ct of head showed microvascular changes only, unable to have MRI due to pacemaker

## 2015-09-01 NOTE — Patient Instructions (Addendum)
  Test(s) ordered today. Your results will be released to [facility] (or called to you) after review, usually within 72hours after test completion. If any changes need to be made, you will be notified at that same time.  All other Health Maintenance issues reviewed.   All recommended immunizations and age-appropriate screenings are up-to-date or discussed.  No immunizations administered today.   Medications reviewed and updated.  Changes included stopping the Klor-Con.   A referral was ordered for neurology.

## 2015-09-02 ENCOUNTER — Telehealth: Payer: Self-pay | Admitting: Cardiovascular Disease

## 2015-09-02 LAB — ANA: Anti Nuclear Antibody(ANA): NEGATIVE

## 2015-09-02 MED ORDER — ASPIRIN EC 81 MG PO TBEC: 81.0000 mg | DELAYED_RELEASE_TABLET | Freq: Every day | ORAL | Status: DC

## 2015-09-02 NOTE — Telephone Encounter (Signed)
Discussed with Dr Burt Knack and the pt is okay to decrease Aspirin to 81mg  daily.  Pt aware and medication list updated.

## 2015-09-02 NOTE — Telephone Encounter (Signed)
New message      Pt saw his eye doctor on last Monday because he had a "blood shot" eye all of a suddenly.  The eye doctor said he thought it was coming from "too much aspirin" in his system and told him to contact Dr Burt Knack.  Please advise

## 2015-09-03 ENCOUNTER — Encounter: Payer: Self-pay | Admitting: Internal Medicine

## 2015-09-08 ENCOUNTER — Ambulatory Visit (INDEPENDENT_AMBULATORY_CARE_PROVIDER_SITE_OTHER): Payer: Medicare Other

## 2015-09-08 DIAGNOSIS — Z23 Encounter for immunization: Secondary | ICD-10-CM | POA: Diagnosis not present

## 2015-09-13 ENCOUNTER — Telehealth: Payer: Self-pay | Admitting: Internal Medicine

## 2015-09-13 NOTE — Telephone Encounter (Signed)
New Message:    The FDA announced last night he said, that the pacemaker are being hacked.Pt is very concerned

## 2015-09-13 NOTE — Telephone Encounter (Signed)
Informed patient that the cyber security concerns are not related to his Medtronic device. I clarified that this is indeed a theoretical risk and that as of this time no device has actually been hacked. Pt reported appreciation and will call with any other questions.

## 2015-10-03 ENCOUNTER — Ambulatory Visit (INDEPENDENT_AMBULATORY_CARE_PROVIDER_SITE_OTHER): Payer: Medicare Other | Admitting: *Deleted

## 2015-10-03 DIAGNOSIS — I441 Atrioventricular block, second degree: Secondary | ICD-10-CM

## 2015-10-03 NOTE — Progress Notes (Signed)
Remote pacemaker transmission.   

## 2015-10-05 ENCOUNTER — Encounter: Payer: Self-pay | Admitting: Cardiology

## 2015-10-27 ENCOUNTER — Other Ambulatory Visit: Payer: Self-pay | Admitting: Internal Medicine

## 2015-10-27 LAB — CUP PACEART REMOTE DEVICE CHECK
Battery Impedance: 184 Ohm
Brady Statistic AP VP Percent: 0 %
Brady Statistic AP VS Percent: 44 %
Brady Statistic AS VS Percent: 55 %
Date Time Interrogation Session: 20170925114619
Implantable Lead Implant Date: 20140321
Implantable Lead Location: 753860
Lead Channel Impedance Value: 636 Ohm
Lead Channel Pacing Threshold Amplitude: 0.625 V
Lead Channel Pacing Threshold Amplitude: 1 V
Lead Channel Sensing Intrinsic Amplitude: 16 mV
Lead Channel Sensing Intrinsic Amplitude: 2.8 mV
Lead Channel Setting Pacing Pulse Width: 0.4 ms
Lead Channel Setting Sensing Sensitivity: 5.6 mV
MDC IDC LEAD IMPLANT DT: 20140321
MDC IDC LEAD LOCATION: 753859
MDC IDC MSMT BATTERY REMAINING LONGEVITY: 132 mo
MDC IDC MSMT BATTERY VOLTAGE: 2.79 V
MDC IDC MSMT LEADCHNL RA IMPEDANCE VALUE: 441 Ohm
MDC IDC MSMT LEADCHNL RA PACING THRESHOLD PULSEWIDTH: 0.4 ms
MDC IDC MSMT LEADCHNL RV PACING THRESHOLD PULSEWIDTH: 0.4 ms
MDC IDC SET LEADCHNL RA PACING AMPLITUDE: 2 V
MDC IDC SET LEADCHNL RV PACING AMPLITUDE: 2.5 V
MDC IDC STAT BRADY AS VP PERCENT: 0 %

## 2015-11-16 ENCOUNTER — Ambulatory Visit (INDEPENDENT_AMBULATORY_CARE_PROVIDER_SITE_OTHER): Payer: Medicare Other | Admitting: Neurology

## 2015-11-16 ENCOUNTER — Encounter: Payer: Self-pay | Admitting: Neurology

## 2015-11-16 VITALS — BP 134/68 | HR 70 | Ht 71.0 in | Wt 166.9 lb

## 2015-11-16 DIAGNOSIS — G4700 Insomnia, unspecified: Secondary | ICD-10-CM | POA: Diagnosis not present

## 2015-11-16 DIAGNOSIS — G44209 Tension-type headache, unspecified, not intractable: Secondary | ICD-10-CM | POA: Diagnosis not present

## 2015-11-16 DIAGNOSIS — I25810 Atherosclerosis of coronary artery bypass graft(s) without angina pectoris: Secondary | ICD-10-CM | POA: Diagnosis not present

## 2015-11-16 NOTE — Progress Notes (Signed)
NEUROLOGY CONSULTATION NOTE  KOLBEE BOGUSZ MRN: [id number] DOB: [birth year]  Referring provider: Dr. Quay Burow Primary care provider: Dr. Quay Burow  Reason for consult:  headache  HISTORY OF PRESENT ILLNESS: [name] is an 82 year old man with CAD, 2nd degree AV block, PPM, hypertension, dyslipidemia, chronic renal impairment stage 3, peripheral neuropathy, B12 deficiency, DDD cervical spine status post surgery, and history of prostate cancer status post surgery 2001 presents for headache.  History obtained by patient his wife and PCP notes.  He and his wife had been under a lot of stress over the past year, due to family health issues.  They had to live and care for his 64 year old sister and then her daughter injured herself as well.    In June, he began experiencing a new headache.  It was a nonthrobbing 3/10 headache in a band-like distribution.  There were no associated symptoms such as nausea, photophobia, phonophobia or visual disturbance.  It would occur about 4 days a week, on and off throughout the day.  He denies neck pain.  He took ASA and Tylenol.  Headaches started to improve in mid-September and is now feeling better.  When he had a headache, he would twist his mouth when he spoke.  CT of head from 07/14/15 was personally reviewed and showed generalized atrophy and mild chronic small vessel ischemic changes. Labs from 09/01/15: CMP: Na 139, K 4.8, BUN 28, Cr 1.83, Total bili 1.3, ALP 58, AST 16, ALT 11    CBC:  WBC 7.4, HGB 14.7, HCT 45.1, PLT 222    Sed Rate: 5, CRP 0.2  He has remote history of migraines many years ago.   He also has insomnia for many years.  PAST MEDICAL HISTORY: Past Medical History:  Diagnosis Date  . Acute superficial venous thrombosis of lower extremity    a. RLE after CABG, neg dopp for DVT.  [name] Atrial fibrillation ([place])    a. Post-op from CABG, on amiodarone temporarily, d/c'd 12/2011  . CAD (coronary artery disease)    a. S/P stenting to  mid RCA, prox PDA 06/1999. b. NSTEMI/CABG x 3 in 10/2011 with LIMA to LAD, SVG to PDA, and SVG to OM1.   . Chronic UTI    a. Followed by Dr. Risa Grill - colonized/asymptomatic - not on abx  . CKD (chronic kidney disease), stage III    a. stable with a creatinine around 1.9-2.0 followed by nephrology.  . Dyslipidemia   . Hypertension    well-controlled.  . Neck injury    a. C3-C4 and C4-C5 foraminal narrowing, severe  . Prostate cancer ([place])    a. 2001 s/p TURP.  . Pulmonary nodule    a. felt to be noncancerous.  Status post followup CT scan 4 mm and stable.  . Renal artery stenosis ([place])    a. 50% by cath 2001  . 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 cardia catherization  07-07-99  . CARDIAC SURGERY  10/18/12   open heart surgery  . CATARACT EXTRACTION W/ INTRAOCULAR LENS  IMPLANT, BILATERAL  3/205, 06/2013   [facility]  . COLONOSCOPY  04/12/07  . CORONARY ARTERY BYPASS GRAFT  10/19/2011   Procedure: CORONARY ARTERY BYPASS GRAFTING (CABG);  Surgeon: Gaye Pollack, MD;  Location: [facility];  Service: Open Heart Surgery;  Laterality: N/A;  times three using Left Internal  Mammary Artery and Right Greater Saphenouse Vein Graft harvested Endoscopically  . edg  07-17-1994  . FLEXIBLE SIGMOIDOSCOPY  11-03-1997  . LEFT HEART CATHETERIZATION WITH CORONARY ANGIOGRAM N/A 10/16/2011   Procedure: LEFT HEART CATHETERIZATION WITH CORONARY ANGIOGRAM;  Surgeon: Peter M Martinique, MD;  Location: [facility] CATH LAB;  Service: Cardiovascular;  Laterality: N/A;  . LEFT HEART CATHETERIZATION WITH CORONARY/GRAFT ANGIOGRAM N/A 03/11/2013   Procedure: LEFT HEART CATHETERIZATION WITH [name];  Surgeon: Blane Ohara, MD;  Location: [facility] CATH LAB;  Service: Cardiovascular;  Laterality: N/A;  . lumbar spinal disk and neck fusion surgery    . PACEMAKER INSERTION  03/28/12   PPM implanted for mobitz II  AV block  . peripheral vascular catherization  11-24-03  . PERMANENT PACEMAKER INSERTION N/A 03/28/2012   Procedure: PERMANENT PACEMAKER INSERTION;  Surgeon: Thompson Grayer, MD;  Location: [facility] CATH LAB;  Service: Cardiovascular;  Laterality: N/A;  . PROSTATECTOMY    . renal circulation  10-01-03  . s/p ptca    . stents     X 2  . stress cardiolite  05-04-05   spring 09-negative except for apical thinning, EF 68%    MEDICATIONS: Current Outpatient Prescriptions on File Prior to Visit  Medication Sig Dispense Refill  . aspirin EC 81 MG tablet Take 1 tablet (81 mg total) by mouth daily.    . carboxymethylcellulose (REFRESH PLUS) 0.5 % SOLN Place 1 drop into both eyes daily as needed (dry eyes). 30 mL 1  . clotrimazole-betamethasone (LOTRISONE) cream APPLY 1 APPLICATION TOPICALLY DAILY AS NEEDED FOR ITCHING 45 g 3  . DiphenhydrAMINE HCl, Sleep, (ZZZQUIL PO) Take 1 Dose by mouth at bedtime as needed (sleep).    . ergocalciferol (VITAMIN D2) 50000 UNITS capsule Take 50,000 Units by mouth every 30 (thirty) days. 15 th of each month    . folic acid (FOLVITE) 1 MG tablet Take 1 tablet (1 mg total) by mouth daily. 90 tablet 0  . KLOR-CON M10 10 MEQ tablet TAKE 1 TABLET DAILY 90 tablet 2  . Multiple Vitamins-Minerals (PRESERVISION AREDS PO) Take by mouth daily.    . nitroGLYCERIN (NITROSTAT) 0.4 MG SL tablet DISSOLVE 1 TABLET UNDER THE TONGUE EVERY 5 MINUTES AS NEEDED FOR CHEST PAIN (MAX OF 3 TABLETS) 100 tablet 0  . simvastatin (ZOCOR) 20 MG tablet TAKE 1 TABLET DAILY 90 tablet 2  . TOPROL XL 25 MG 24 hr tablet TAKE 1 TABLET TWICE A DAY 180 tablet 2  . vitamin B-12 (CYANOCOBALAMIN) 500 MCG tablet Take 500 mcg by mouth daily. Take one tablet every 3rd day    . vitamin C (ASCORBIC ACID) 500 MG tablet Take 1,500 mg by mouth daily.     No current facility-administered medications on file prior to visit.     ALLERGIES: Allergies  Allergen Reactions  . Amoxicillin Nausea Only  . Aspirin Other (See  Comments)    REACTION: upset stomach; tolerates coated aspirin  . Atarax [Hydroxyzine Hcl] Other (See Comments)    REACTION: unknown  . Ciprofloxacin Nausea Only  . Codeine     REACTION: Stomach upset  . Hydrocodone Nausea Only  . Hydroxyzine Nausea And Vomiting  . Macrobid [Nitrofurantoin Macrocrystal] Nausea Only  . Niacin Other (See Comments)    REACTION: unknown  . Niacin-Lovastatin Er     Unsure of reaction. Taking simvastatin at home without problems  . Nitrofurantoin Other (See Comments)    REACTION: upset stomach  . Trimox [Amoxicillin Trihydrate] Other (See Comments)  REACTION: unknown  . Bactrim [Sulfamethoxazole-Trimethoprim] Rash    FAMILY HISTORY: Family History  Problem Relation Age of Onset  . Coronary artery disease Father     died @ 40  . Other Mother     cerebral hemorrhage - died @ 5  . Arthritis Mother   . Cancer Brother     Bladder  . Prostate cancer Brother   . Nephritis Brother     died @ age 70.  . Other Brother     cerebral hemorrhage - died @ 79  . Heart disease Brother   . Breast cancer Other     niece x 2  . Diabetes Neg Hx     SOCIAL HISTORY: Social History   Social History  . Marital status: Married    Spouse name: N/A  . Number of children: 2  . Years of education: N/A   Occupational History  . 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  Retired   Social History Main Topics  . Smoking status: Never Smoker  . Smokeless tobacco: Never Used  . Alcohol use Yes     Comment: Rarely  . Drug use: No  . Sexual activity: Not on file   Other Topics Concern  . Not on file   Social History Narrative   HSG, 1 year college.  married '52 - 3 years, divorced; married '56 - 3 years divorced; married '66-12 yrs - divorced; married '75 -. 1 son '57; 1 daughter - '53; 1 grandchild.  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  Very happily married.  End of life care: yes CPR, no long term mechanical ventilation, no heroic measures.    REVIEW OF SYSTEMS: Constitutional: No fevers, chills, or sweats, no generalized fatigue, change in appetite Eyes: No visual changes, double vision, eye pain Ear, nose and throat: No hearing loss, ear pain, nasal congestion, sore throat Cardiovascular: No chest pain, palpitations Respiratory:  No shortness of breath at rest or with exertion, wheezes GastrointestinaI: No nausea, vomiting, diarrhea, abdominal pain, fecal incontinence Genitourinary:  No dysuria, urinary retention or frequency Musculoskeletal:  No neck pain, back pain Integumentary: No rash, pruritus, skin lesions Neurological: as above Psychiatric: Stress, insomnia Endocrine: No palpitations, fatigue, diaphoresis, mood swings, change in appetite, change in weight, increased thirst Hematologic/Lymphatic:  No purpura, petechiae. Allergic/Immunologic: no itchy/runny eyes, nasal congestion, recent allergic reactions, rashes  PHYSICAL EXAM: Vitals:   11/16/15 1444  BP: 134/68  Pulse: 70   General: No acute distress.  Patient appears well-groomed.   Head:  Normocephalic/atraumatic Eyes:  fundi examined but not visualized Neck: supple, no paraspinal tenderness, full range of motion Back: No paraspinal tenderness Heart: regular rate and rhythm Lungs: Clear to auscultation bilaterally. Vascular: No carotid bruits. Neurological Exam: Mental status: alert and oriented to person, place, and time, recent and remote memory intact, fund of knowledge intact, attention and concentration intact, speech fluent and not dysarthric, language intact. Cranial nerves: CN I: not tested CN II: pupils equal, round and reactive to light, visual fields intact CN III, IV, VI:  full range of motion, no nystagmus, no ptosis CN V: facial sensation intact CN VII: upper and lower face symmetric CN VIII: hearing intact CN IX, X:  gag intact, uvula midline CN XI: sternocleidomastoid and trapezius muscles intact CN XII: tongue midline Bulk & Tone:  normal, no fasciculations. Motor:  5/5 throughout  Sensation:  Decreased temperature and vibration sensation in feet up to ankles. Deep Tendon Reflexes: absent,  toes equivocal Finger to nose testing:  Without dysmetria.   Heel to shin:  Without dysmetria.   Gait:  Normal station and stride.  Able to turn but unsteady with tandem walk. Romberg negative.  IMPRESSION: Probable tension-type headaches, improved.  Twisting of mouth while speaking was likely stress-related. Insomnia  PLAN: No further workup Discussed sleep hygiene and provided sheet on improving sleep hygiene Follow up as needed.  Thank you for allowing me to take part in the care of this patient.  Metta Clines, DO  CC:  Billey Gosling, MD

## 2015-11-16 NOTE — Patient Instructions (Signed)
I think the headaches were tension headaches.  Thankfully, they are getting better Follow the sleep hygiene suggestions to help improve sleep Follow up as needed.

## 2015-12-19 ENCOUNTER — Telehealth: Payer: Self-pay | Admitting: *Deleted

## 2015-12-19 MED ORDER — FOLIC ACID 1 MG PO TABS: 1.0000 mg | ORAL_TABLET | Freq: Every day | ORAL | 3 refills | Status: DC

## 2015-12-19 NOTE — Telephone Encounter (Signed)
Pt left msg on triage stating his mail service inform him that his Folic Acid was denied due to not taking. Pt states he has not been told that he needed to stop taking. Wanting to verify if he need to stop if not need rx sent to his mail service...[name]

## 2015-12-19 NOTE — Telephone Encounter (Signed)
I would advise he take it daily

## 2015-12-19 NOTE — Telephone Encounter (Signed)
Called pt no answer LMOM w/MD response. [facility]...lmb

## 2016-01-03 ENCOUNTER — Telehealth: Payer: Self-pay | Admitting: Cardiovascular Disease

## 2016-01-03 NOTE — Telephone Encounter (Signed)
°  1. Has your device fired? no 2. Is you device beeping? no 3. Are you experiencing draining or swelling at device site? no  4. Are you calling to see if we received your device transmission? no 5. Have you passed out? No   Pt verbalized that he cant stop burping

## 2016-01-03 NOTE — Telephone Encounter (Signed)
Called pt back and let him know that his transmission had been received and it was normal. Pt stated that he had hiccups instead of burping since Friday but had finally stopped this morning. Pt stated that he had been having drainage and had taken Mucinex and also stated that he had tried honey to stop the hiccups. Encouraged pt to call primary care provider is hiccups resumed, pt voiced understanding.

## 2016-01-03 NOTE — Telephone Encounter (Signed)
Called pt back and requested that he send in a remote transmission pt stated that he would. Informed pt he would receive a call when transmission is received.

## 2016-01-04 ENCOUNTER — Ambulatory Visit (INDEPENDENT_AMBULATORY_CARE_PROVIDER_SITE_OTHER): Payer: Medicare Other | Admitting: *Deleted

## 2016-01-04 ENCOUNTER — Telehealth: Payer: Self-pay | Admitting: Cardiology

## 2016-01-04 DIAGNOSIS — I441 Atrioventricular block, second degree: Secondary | ICD-10-CM | POA: Diagnosis not present

## 2016-01-04 NOTE — Telephone Encounter (Signed)
LMOVM reminding pt to send remote transmission.   

## 2016-01-05 NOTE — Progress Notes (Signed)
Remote pacemaker transmission.   

## 2016-01-06 ENCOUNTER — Encounter: Payer: Self-pay | Admitting: Cardiology

## 2016-01-06 LAB — CUP PACEART REMOTE DEVICE CHECK
Battery Remaining Longevity: 128 mo
Brady Statistic AP VP Percent: 0 %
Brady Statistic AP VS Percent: 42 %
Brady Statistic AS VP Percent: 0 %
Implantable Lead Implant Date: 20140321
Implantable Lead Implant Date: 20140321
Implantable Lead Location: 753860
Implantable Lead Model: 5092
Implantable Pulse Generator Implant Date: 20140321
Lead Channel Impedance Value: 459 Ohm
Lead Channel Pacing Threshold Amplitude: 0.625 V
Lead Channel Pacing Threshold Amplitude: 0.875 V
Lead Channel Pacing Threshold Pulse Width: 0.4 ms
Lead Channel Pacing Threshold Pulse Width: 0.4 ms
MDC IDC LEAD LOCATION: 753859
MDC IDC MSMT BATTERY IMPEDANCE: 208 Ohm
MDC IDC MSMT BATTERY VOLTAGE: 2.79 V
MDC IDC MSMT LEADCHNL RV IMPEDANCE VALUE: 619 Ohm
MDC IDC SESS DTM: 20171226155158
MDC IDC SET LEADCHNL RA PACING AMPLITUDE: 2 V
MDC IDC SET LEADCHNL RV PACING AMPLITUDE: 2.5 V
MDC IDC SET LEADCHNL RV PACING PULSEWIDTH: 0.4 ms
MDC IDC SET LEADCHNL RV SENSING SENSITIVITY: 5.6 mV
MDC IDC STAT BRADY AS VS PERCENT: 57 %

## 2016-02-06 ENCOUNTER — Telehealth: Payer: Self-pay | Admitting: Internal Medicine

## 2016-02-06 ENCOUNTER — Encounter: Payer: Self-pay | Admitting: Internal Medicine

## 2016-02-06 ENCOUNTER — Ambulatory Visit (INDEPENDENT_AMBULATORY_CARE_PROVIDER_SITE_OTHER): Payer: Medicare Other | Admitting: Internal Medicine

## 2016-02-06 VITALS — BP 160/88 | HR 59 | Temp 97.9°F | Resp 16 | Ht 71.0 in | Wt 170.0 lb

## 2016-02-06 DIAGNOSIS — R1310 Dysphagia, unspecified: Secondary | ICD-10-CM | POA: Insufficient documentation

## 2016-02-06 DIAGNOSIS — R195 Other fecal abnormalities: Secondary | ICD-10-CM | POA: Diagnosis not present

## 2016-02-06 DIAGNOSIS — Z79899 Other long term (current) drug therapy: Secondary | ICD-10-CM | POA: Diagnosis not present

## 2016-02-06 HISTORY — DX: Dysphagia, unspecified: R13.10

## 2016-02-06 MED ORDER — HYDROCORTISONE 2.5 % RE CREA: 1.0000 "application " | TOPICAL_CREAM | Freq: Two times a day (BID) | RECTAL | 0 refills | Status: DC

## 2016-02-06 MED ORDER — PROBIOTIC ACIDOPHILUS BEADS PO CAPS: 1.0000 | ORAL_CAPSULE | Freq: Two times a day (BID) | ORAL | 2 refills | Status: DC

## 2016-02-06 NOTE — Progress Notes (Signed)
   Subjective:    Patient ID: [name], male    DOB: 1926-05-19, 83 y.o.   MRN: [id number]  HPI The patient is an 83 YO man coming in for loose stools. Started when he started taking clindamycin in December and called his dentist. They changed it to keflex for another week. He then started having more loose stools on keflex. Stopped taking keflex around the 10th of January. As he got off the antibiotic his stools started to get more firm. Now he is still having slight looseness. Having 2 bowel movements per day. Able to eat and drink normally through this. No nausea or vomiting through this. No blood in the bowel movements throughout this episode. Overall is improving.  He is also having another concern. He thinks he was told to stop taking potassium pills long ago but they are still on his list and he is still taking. He thinks maybe he took lasix long ago but does not for a long time. He denies muscle weakness or cramps.  Next concern is problems with swallowing. Happens randomly. The food goes partially down and gets stuck. Can be water or solid foods and this does not seem to make a difference. He has not been evaluated for this previously. He is able to try to swallow again and eventually the food goes down. He does not have to choke the food up ever.   Review of Systems  Constitutional: Negative for activity change, appetite change, chills, fatigue, fever and unexpected weight change.  HENT: Positive for trouble swallowing. Negative for congestion, sinus pain, sinus pressure and sore throat.   Eyes: Negative.   Respiratory: Negative for cough, chest tightness, shortness of breath and wheezing.   Cardiovascular: Negative for chest pain, palpitations and leg swelling.  Gastrointestinal: Positive for diarrhea. Negative for abdominal distention, abdominal pain, blood in stool, constipation, nausea and vomiting.  Musculoskeletal: Negative.   Skin: Negative.       Objective:   Physical  Exam  Constitutional: He is oriented to person, place, and time. He appears well-developed and well-nourished.  HENT:  Head: Atraumatic.  Right Ear: External ear normal.  Mouth/Throat: Oropharynx is clear and moist.  Eyes: EOM are normal.  Neck: Normal range of motion.  Cardiovascular: Normal rate and regular rhythm.   Pulmonary/Chest: Effort normal and breath sounds normal. No respiratory distress. He has no wheezes. He has no rales.  Abdominal: Soft. Bowel sounds are normal. He exhibits no distension. There is no tenderness. There is no rebound.  Musculoskeletal: He exhibits no edema.  Neurological: He is alert and oriented to person, place, and time.  Skin: Skin is warm and dry.   Vitals:   02/06/16 1334  BP: (!) 160/88  Pulse: (!) 59  Resp: 16  Temp: 97.9 F (36.6 C)  TempSrc: Oral  SpO2: 99%  Weight: 170 lb (77.1 kg)  Height: 5\' 11"  (1.803 m)      Assessment & Plan:

## 2016-02-06 NOTE — Assessment & Plan Note (Signed)
Referral to GI for evaluation. Given his other concerns and acute nature of the visit unable to assess fully. He does not have history of GERD. Could be stricture but given the nature (liquids and solids) neoplasm is not excluded and needs evaluation. No weight change.

## 2016-02-06 NOTE — Assessment & Plan Note (Signed)
Overall symptoms are currently mild and improving. Suspect antibiotic induced diarrhea. He does not have current symptoms of C dif. Rx for probiotic and if no improvement needs testing for C dif. Took clindamycin and keflex recently for dental infection. Weight stable and hydration status normal.

## 2016-02-06 NOTE — Patient Instructions (Addendum)
It is safe to stop taking klor-con.   We have sent in the cream which you can use twice daily on your bottom to help it heal.   We have also sent in the probiotic to the pharmacy to help the good bacteria to come back in the bowels. Take 1 pill twice a day for 1-2 months and this should help.

## 2016-02-06 NOTE — Progress Notes (Signed)
Pre visit review using our clinic review tool, if applicable. No additional management support is needed unless otherwise documented below in the visit note. 

## 2016-02-06 NOTE — Assessment & Plan Note (Signed)
Reviewed records and no indication for potassium pills. His levels for the last 3 years are in the normal range. He was on lasix back in 2012 and likely when he stopped taking this the potassium should have been stopped. Advised to stop taking and removed from his list today.

## 2016-02-06 NOTE — Telephone Encounter (Signed)
Please advise 

## 2016-02-06 NOTE — Telephone Encounter (Signed)
Patient states Dr. Quay Burow was to refer him for a swallowing test?  Please follow up in regard.

## 2016-02-06 NOTE — Telephone Encounter (Signed)
It looks like Dr Sharlet Salina referred him to GI today for difficulty swallowing - they will evaluate him and can order additional tests.

## 2016-02-07 NOTE — Telephone Encounter (Signed)
Spoke with pt to inform.  

## 2016-02-09 ENCOUNTER — Encounter: Payer: Self-pay | Admitting: Gastroenterology

## 2016-02-10 ENCOUNTER — Encounter: Payer: Self-pay | Admitting: Gastroenterology

## 2016-02-29 ENCOUNTER — Encounter: Payer: Self-pay | Admitting: Internal Medicine

## 2016-02-29 ENCOUNTER — Ambulatory Visit (INDEPENDENT_AMBULATORY_CARE_PROVIDER_SITE_OTHER): Payer: Medicare Other | Admitting: Internal Medicine

## 2016-02-29 DIAGNOSIS — R195 Other fecal abnormalities: Secondary | ICD-10-CM | POA: Diagnosis not present

## 2016-02-29 MED ORDER — CLOBETASOL PROPIONATE 0.05 % EX OINT: 1.0000 "application " | TOPICAL_OINTMENT | Freq: Two times a day (BID) | CUTANEOUS | 0 refills | Status: DC

## 2016-02-29 NOTE — Assessment & Plan Note (Signed)
Rx for clobetasol cream for short term usage for the inflammation on exam. Also discussed sitz baths to help along with tucks pads for wiping. No signs of yeast on exam.

## 2016-02-29 NOTE — Progress Notes (Signed)
Pre visit review using our clinic review tool, if applicable. No additional management support is needed unless otherwise documented below in the visit note. 

## 2016-02-29 NOTE — Patient Instructions (Signed)
We have sent in a new cream which should help more with the irritation and you can use twice a day.   It might be a good idea to use tucks pads when wiping to help avoid more irritation.

## 2016-02-29 NOTE — Progress Notes (Signed)
   Subjective:    Patient ID: [name], male    DOB: Sep 07, 1926, 83 y.o.   MRN: [id number]  HPI The patient is an 83 YO man coming in for anal irritation. He was seen about 1 month ago for loose stools. These are back to normal now and no loose stools. He is using anusol on the anus but there is still some irritation. Denies sores or ulcers. No fevers or chills. Using a pad which does cause a little moisture in the area.   Review of Systems  Constitutional: Negative.   Respiratory: Negative.   Cardiovascular: Negative.   Gastrointestinal: Negative for abdominal distention, abdominal pain, anal bleeding, blood in stool, constipation, diarrhea, nausea, rectal pain and vomiting.       Anal pain  Genitourinary: Negative.   Musculoskeletal: Negative.       Objective:   Physical Exam  Constitutional: He is oriented to person, place, and time. He appears well-developed and well-nourished.  HENT:  Head: Normocephalic and atraumatic.  Eyes: EOM are normal.  Cardiovascular: Normal rate and regular rhythm.   Pulmonary/Chest: Effort normal. No respiratory distress. He has no wheezes.  Abdominal: Soft. Bowel sounds are normal. He exhibits no distension. There is no tenderness. There is no rebound.  Genitourinary:  Genitourinary Comments: No major hemorrhoids protruding, redness on the cheeks without satellite lesions.   Neurological: He is alert and oriented to person, place, and time.  Skin: Skin is warm and dry.   Vitals:   02/29/16 1021  BP: 140/82  Pulse: 66  Temp: 98.3 F (36.8 C)  TempSrc: Oral  SpO2: 98%  Weight: 170 lb (77.1 kg)  Height: 5\' 11"  (1.803 m)      Assessment & Plan:

## 2016-03-15 ENCOUNTER — Ambulatory Visit (INDEPENDENT_AMBULATORY_CARE_PROVIDER_SITE_OTHER): Payer: Medicare Other | Admitting: Gastroenterology

## 2016-03-15 ENCOUNTER — Encounter: Payer: Self-pay | Admitting: Gastroenterology

## 2016-03-15 VITALS — BP 140/60 | HR 66 | Ht 71.0 in | Wt 166.0 lb

## 2016-03-15 DIAGNOSIS — R1314 Dysphagia, pharyngoesophageal phase: Secondary | ICD-10-CM | POA: Diagnosis not present

## 2016-03-15 NOTE — Progress Notes (Signed)
[facility] Consult Note:  History: [name] 03/15/2016  Referring physician: [name], MD  Reason for consult/chief complaint: Dysphagia (solids mostly, onset on/off since Nov 2017)   Subjective  HPI:  This 83 year old man was referred by primary care for several months of solid food dysphagia. Intermittently, food will feel hung up in the neck before finally passing. It does not occur with liquids, there is been no coughing or choking with meals, and he denies a change in voice. He does not get episodes of food stuck in the chest, denies early satiety, nausea, vomiting or weight loss. He recalls having had an upper endoscopy decades ago, findings are unknown. He does not have to bring food back up and does not find undigested food on his pillow in the morning. In addition, he has had several years of feeling a pulling or popping sensation in his neck when he flexes or extends it. This was after a cervical spine surgery years ago. His PCP did C-spine films in February 2016 showing that the hardware was in place, there are no reports of any significant osteophytes. He had a colonoscopy in April 2009 with Dr. Deatra Ina. ROS:  Review of Systems   Past Medical History: Past Medical History:  Diagnosis Date  . Acute superficial venous thrombosis of lower extremity    a. RLE after CABG, neg dopp for DVT.  [name] Atrial fibrillation ([place])    a. Post-op from CABG, on amiodarone temporarily, d/c'd 12/2011  . CAD (coronary artery disease)    a. S/P stenting to mid RCA, prox PDA 06/1999. b. NSTEMI/CABG x 3 in 10/2011 with LIMA to LAD, SVG to PDA, and SVG to OM1.   . Chronic UTI    a. Followed by Dr. Risa Grill - colonized/asymptomatic - not on abx  . CKD (chronic kidney disease), stage III    a. stable with a creatinine around 1.9-2.0 followed by nephrology.  . Dyslipidemia   . Hypertension    well-controlled.  . Neck injury    a. C3-C4 and C4-C5 foraminal narrowing,  severe  . Prostate cancer ([place])    a. 2001 s/p TURP.  . Pulmonary nodule    a. felt to be noncancerous.  Status post followup CT scan 4 mm and stable.  . Renal artery stenosis ([place])    a. 50% by cath 2001  . 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 cardia catherization  07-07-99  . CARDIAC SURGERY  10/18/12   open heart surgery  . CATARACT EXTRACTION W/ INTRAOCULAR LENS  IMPLANT, BILATERAL  3/205, 06/2013   [facility]  . COLONOSCOPY  04/12/07  . CORONARY ARTERY BYPASS GRAFT  10/19/2011   Procedure: CORONARY ARTERY BYPASS GRAFTING (CABG);  Surgeon: Gaye Pollack, MD;  Location: [facility];  Service: Open Heart Surgery;  Laterality: N/A;  times three using Left Internal Mammary Artery and Right Greater Saphenouse Vein Graft harvested Endoscopically  . edg  07-17-1994  . FLEXIBLE SIGMOIDOSCOPY  11-03-1997  . LEFT HEART CATHETERIZATION WITH CORONARY ANGIOGRAM N/A 10/16/2011   Procedure: LEFT HEART CATHETERIZATION WITH CORONARY ANGIOGRAM;  Surgeon: Peter M Martinique, MD;  Location: [facility] CATH LAB;  Service: Cardiovascular;  Laterality: N/A;  . LEFT HEART CATHETERIZATION WITH CORONARY/GRAFT ANGIOGRAM N/A 03/11/2013   Procedure: LEFT HEART CATHETERIZATION WITH [name];  Surgeon: Blane Ohara, MD;  Location: [facility] CATH LAB;  Service:  Cardiovascular;  Laterality: N/A;  . lumbar spinal disk and neck fusion surgery    . PACEMAKER INSERTION  03/28/12   PPM implanted for mobitz II AV block  . peripheral vascular catherization  11-24-03  . PERMANENT PACEMAKER INSERTION N/A 03/28/2012   Procedure: PERMANENT PACEMAKER INSERTION;  Surgeon: Thompson Grayer, MD;  Location: [facility] CATH LAB;  Service: Cardiovascular;  Laterality: N/A;  . PROSTATECTOMY    . renal circulation  10-01-03  . s/p ptca    . stents     X 2  . stress cardiolite  05-04-05   spring 09-negative except for apical thinning, EF  68%     Family History: Family History  Problem Relation Age of Onset  . Coronary artery disease Father     died @ 29  . Other Mother     cerebral hemorrhage - died @ 46  . Arthritis Mother   . Cancer Brother     Bladder  . Prostate cancer Brother   . Nephritis Brother     died @ age 66.  . Other Brother     cerebral hemorrhage - died @ 61  . Heart disease Brother   . Breast cancer Other     niece x 2  . Diabetes Neg Hx   . Colon cancer Neg Hx     Social History: Social History   Social History  . Marital status: Married    Spouse name: [name]  . Number of children: 2  . Years of education: N/A   Occupational History  . 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  Retired   Social History Main Topics  . Smoking status: Never Smoker  . Smokeless tobacco: Never Used  . Alcohol use Yes     Comment: Rarely  . Drug use: No  . Sexual activity: Not Asked   Other Topics Concern  . None   Social History Narrative   HSG, 1 year college.  married '52 - 3 years, divorced; married '56 - 3 years divorced; married '14-12 yrs - divorced; married '75 -. 1 son '57; 1 daughter - '53; 1 grandchild.  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  Very happily married.  End of life care: yes CPR, no long term mechanical ventilation, no heroic measures.    Allergies: Allergies  Allergen Reactions  . Amoxicillin Nausea Only  . Aspirin Other (See Comments)    REACTION: upset stomach; tolerates coated aspirin  . Atarax [Hydroxyzine Hcl] Other (See Comments)    REACTION: unknown  . Ciprofloxacin Nausea Only  . Codeine     REACTION: Stomach upset  . Hydrocodone Nausea Only  . Hydroxyzine Nausea And Vomiting  . Macrobid [Nitrofurantoin Macrocrystal] Nausea Only  . Niacin Other (See Comments)    REACTION: unknown  . Niacin-Lovastatin Er     Unsure of reaction.  Taking simvastatin at home without problems  . Nitrofurantoin Other (See Comments)    REACTION: upset stomach  . Trimox [Amoxicillin Trihydrate] Other (See Comments)    REACTION: unknown  . Bactrim [Sulfamethoxazole-Trimethoprim] Rash    Outpatient Meds: Current Outpatient Prescriptions  Medication Sig Dispense Refill  . aspirin EC 81 MG tablet Take 1 tablet (81 mg total) by mouth daily.    . carboxymethylcellulose (REFRESH PLUS) 0.5 % SOLN Place 1 drop into both eyes daily as  needed (dry eyes). 30 mL 1  . clotrimazole-betamethasone (LOTRISONE) cream APPLY 1 APPLICATION TOPICALLY DAILY AS NEEDED FOR ITCHING 45 g 3  . DiphenhydrAMINE HCl, Sleep, (ZZZQUIL PO) Take 1 Dose by mouth at bedtime as needed (sleep).    . ergocalciferol (VITAMIN D2) 50000 UNITS capsule Take 50,000 Units by mouth every 30 (thirty) days. 15 th of each month    . folic acid (FOLVITE) 1 MG tablet Take 1 tablet (1 mg total) by mouth daily. 90 tablet 3  . Multiple Vitamins-Minerals (PRESERVISION AREDS PO) Take by mouth daily.    . nitroGLYCERIN (NITROSTAT) 0.4 MG SL tablet DISSOLVE 1 TABLET UNDER THE TONGUE EVERY 5 MINUTES AS NEEDED FOR CHEST PAIN (MAX OF 3 TABLETS) 100 tablet 0  . simvastatin (ZOCOR) 20 MG tablet TAKE 1 TABLET DAILY 90 tablet 2  . TOPROL XL 25 MG 24 hr tablet TAKE 1 TABLET TWICE A DAY 180 tablet 2  . vitamin B-12 (CYANOCOBALAMIN) 500 MCG tablet Take 500 mcg by mouth daily. Take one tablet every 3rd day    . vitamin C (ASCORBIC ACID) 500 MG tablet Take 1,500 mg by mouth daily.     No current facility-administered medications for this visit.       ___________________________________________________________________ Objective   Exam:  BP 140/60   Pulse 66   Ht 5\' 11"  (1.803 m)   Wt 166 lb (75.3 kg)   BMI 23.15 kg/m    General: this is a(n) Well-appearing elderly man, ambulatory, alert and conversational with normal vocal quality and no muscle wasting   Eyes: sclera anicteric, no  redness  ENT: oral mucosa moist without lesions, no cervical or supraclavicular lymphadenopathy, good dentition  CV: RRR without murmur, S1/S2, no JVD, no peripheral edema  Resp: clear to auscultation bilaterally, normal RR and effort noted  GI: soft, no tenderness, with active bowel sounds. No guarding or palpable organomegaly noted.  Skin; warm and dry, no rash or jaundice noted  Neuro: awake, alert and oriented x 3. Normal gross motor function and fluent speech   Radiologic Studies:  Cervical spine x-rays as noted above  Assessment: Encounter Diagnosis  Name Primary?  . Pharyngoesophageal dysphagia Yes    Possible web/stricture, cricopharyngeal dysfunction, Zenker's diverticulum, impingement by cervical osteophyte.  Plan:  Barium swallow. Based on results, probable upper endoscopy with dilation, may need fluoroscopic guidance if possible proximal stricture requiring wire-guided dilation. Possible repeat cervical spine films based on result.  Thank you for the courtesy of this consult.  Please call me with any questions or concerns.  Nelida Meuse III  CC: [name], MD

## 2016-03-15 NOTE — Patient Instructions (Addendum)
You have been scheduled for a Barium Esophogram at [facility] (1st floor of the hospital) on 03/21/16 at 10:30am. Please arrive 15 minutes prior to your appointment for registration. Make certain not to have anything to eat or drink 3 hours prior to your test. If you need to reschedule for any reason, please contact radiology at [phone] to do so. __________________________________________________________________ A barium swallow is an examination that concentrates on views of the esophagus. This tends to be a double contrast exam (barium and two liquids which, when combined, create a gas to distend the wall of the oesophagus) or single contrast (non-ionic iodine based). The study is usually tailored to your symptoms so a good history is essential. Attention is paid during the study to the form, structure and configuration of the esophagus, looking for functional disorders (such as aspiration, dysphagia, achalasia, motility and reflux) EXAMINATION You may be asked to change into a gown, depending on the type of swallow being performed. A radiologist and radiographer will perform the procedure. The radiologist will advise you of the type of contrast selected for your procedure and direct you during the exam. You will be asked to stand, sit or lie in several different positions and to hold a small amount of fluid in your mouth before being asked to swallow while the imaging is performed .In some instances you may be asked to swallow barium coated marshmallows to assess the motility of a solid food bolus. The exam can be recorded as a digital or video fluoroscopy procedure. POST PROCEDURE It will take 1-2 days for the barium to pass through your system. To facilitate this, it is important, unless otherwise directed, to increase your fluids for the next 24-48hrs and to resume your normal diet.  This test typically takes about 30 minutes to  perform. _________________________________________________________________________

## 2016-03-21 ENCOUNTER — Ambulatory Visit (HOSPITAL_COMMUNITY)
Admission: RE | Admit: 2016-03-21 | Discharge: 2016-03-21 | Disposition: A | Discharge status: Home / Self Care | Payer: Medicare Other | Source: Ambulatory Visit | Attending: Gastroenterology | Admitting: Gastroenterology

## 2016-03-21 DIAGNOSIS — R1314 Dysphagia, pharyngoesophageal phase: Secondary | ICD-10-CM | POA: Diagnosis present

## 2016-03-21 DIAGNOSIS — K449 Diaphragmatic hernia without obstruction or gangrene: Secondary | ICD-10-CM | POA: Insufficient documentation

## 2016-03-21 DIAGNOSIS — K224 Dyskinesia of esophagus: Secondary | ICD-10-CM | POA: Insufficient documentation

## 2016-03-23 ENCOUNTER — Other Ambulatory Visit: Payer: Self-pay

## 2016-03-23 DIAGNOSIS — R933 Abnormal findings on diagnostic imaging of other parts of digestive tract: Secondary | ICD-10-CM

## 2016-03-29 ENCOUNTER — Encounter: Payer: Self-pay | Admitting: Gastroenterology

## 2016-03-29 ENCOUNTER — Ambulatory Visit (AMBULATORY_SURGERY_CENTER): Payer: Medicare Other | Admitting: Gastroenterology

## 2016-03-29 VITALS — BP 158/77 | HR 64 | Temp 98.6°F | Resp 14 | Ht 71.0 in | Wt 166.0 lb

## 2016-03-29 DIAGNOSIS — R1314 Dysphagia, pharyngoesophageal phase: Secondary | ICD-10-CM | POA: Diagnosis present

## 2016-03-29 MED ORDER — SODIUM CHLORIDE 0.9 % IV SOLN: 500.0000 mL | INTRAVENOUS | Status: DC

## 2016-03-29 NOTE — Progress Notes (Signed)
To PACU, vss patent aw report to rn 

## 2016-03-29 NOTE — Progress Notes (Signed)
Patient with history of decreased kidney function. Patient stating all medications prescribed are reviewed by Dr. Edrick Oh, Nephrology.

## 2016-03-29 NOTE — Op Note (Signed)
[facility] Patient Name: [name] Procedure Date: 03/29/2016 10:07 AM MRN: [id number] Endoscopist: Mallie Mussel L. Loletha Carrow , MD Age: 83 Referring MD:  Date of Birth: 02/23/1926 Gender: Male Account #: [id number] Procedure:                Upper GI endoscopy Indications:              Pharyngeal phase dysphagia Medicines:                Monitored Anesthesia Care Procedure:                Pre-Anesthesia Assessment:                           - Prior to the procedure, a History and Physical                            was performed, and patient medications and                            allergies were reviewed. The patient's tolerance of                            previous anesthesia was also reviewed. The risks                            and benefits of the procedure and the sedation                            options and risks were discussed with the patient.                            All questions were answered, and informed consent                            was obtained. Anticoagulants: The patient has taken                            aspirin. It was decided not to withhold this                            medication prior to the procedure. ASA Grade                            Assessment: III - A patient with severe systemic                            disease. After reviewing the risks and benefits,                            the patient was deemed in satisfactory condition to                            undergo the procedure.  After obtaining informed consent, the endoscope was                            passed under direct vision. Throughout the                            procedure, the patient's blood pressure, pulse, and                            oxygen saturations were monitored continuously. The                            Model GIF-HQ190 [id number]) scope was introduced                            through the mouth, and advanced to the second  part                            of duodenum. The upper GI endoscopy was                            accomplished without difficulty. The patient                            tolerated the procedure well. Scope In: Scope Out: Findings:                 The larynx was normal.                           The esophagus was normal. Specifically, there was                            no resistance passing the scope through the UES, no                            web, stricture or mass.                           The stomach was normal.                           The cardia and gastric fundus were normal on                            retroflexion.                           The examined duodenum was normal. Complications:            No immediate complications. Estimated Blood Loss:     Estimated blood loss: none. Impression:               - Normal larynx.                           - Normal esophagus.                           -  Normal stomach.                           - Normal examined duodenum.                           - No specimens collected.                           Recent barium swallow study reveals cervical                            spondylosis with mild extrinsic compression upon                            the upper esophagus. This appears to be the cause                            of symptoms. Recommendation:           - Patient has a contact number available for                            emergencies. The signs and symptoms of potential                            delayed complications were discussed with the                            patient. Return to normal activities tomorrow.                            Written discharge instructions were provided to the                            patient.                           - Resume previous diet.                           - Continue present medications.                           Chin-tuck maneuver when swallowing. [name] Laker L. Loletha Carrow,  MD 03/29/2016 10:28:44 AM This report has been signed electronically.

## 2016-03-29 NOTE — Patient Instructions (Signed)
Impression/recommendations:  Normal exam. Mild extrinsic compression on the esophagus. Chin-tuck maneuver when swallowing.  YOU HAD AN ENDOSCOPIC PROCEDURE TODAY AT [facility]:   Refer to the procedure report that was given to you for any specific questions about what was found during the examination.  If the procedure report does not answer your questions, please call your gastroenterologist to clarify.  If you requested that your care partner not be given the details of your procedure findings, then the procedure report has been included in a sealed envelope for you to review at your convenience later.  YOU SHOULD EXPECT: Some feelings of bloating in the abdomen. Passage of more gas than usual.  Walking can help get rid of the air that was put into your GI tract during the procedure and reduce the bloating. If you had a lower endoscopy (such as a colonoscopy or flexible sigmoidoscopy) you may notice spotting of blood in your stool or on the toilet paper. If you underwent a bowel prep for your procedure, you may not have a normal bowel movement for a few days.  Please Note:  You might notice some irritation and congestion in your nose or some drainage.  This is from the oxygen used during your procedure.  There is no need for concern and it should clear up in a day or so.  SYMPTOMS TO REPORT IMMEDIATELY:   Following upper endoscopy (EGD)  Vomiting of blood or coffee ground material  New chest pain or pain under the shoulder blades  Painful or persistently difficult swallowing  New shortness of breath  Fever of 100F or higher  Black, tarry-looking stools  For urgent or emergent issues, a gastroenterologist can be reached at any hour by calling [phone].   DIET:  We do recommend a small meal at first, but then you may proceed to your regular diet.  Drink plenty of fluids but you should avoid alcoholic beverages for 24 hours.  ACTIVITY:  You should plan to take it  easy for the rest of today and you should NOT DRIVE or use heavy machinery until tomorrow (because of the sedation medicines used during the test).    FOLLOW UP: Our staff will call the number listed on your records the next business day following your procedure to check on you and address any questions or concerns that you may have regarding the information given to you following your procedure. If we do not reach you, we will leave a message.  However, if you are feeling well and you are not experiencing any problems, there is no need to return our call.  We will assume that you have returned to your regular daily activities without incident.  If any biopsies were taken you will be contacted by phone or by letter within the next 1-3 weeks.  Please call us at [phone] if you have not heard about the biopsies in 3 weeks.    SIGNATURES/CONFIDENTIALITY: You and/or your care partner have signed paperwork which will be entered into your electronic medical record.  These signatures attest to the fact that that the information above on your After Visit Summary has been reviewed and is understood.  Full responsibility of the confidentiality of this discharge information lies with you and/or your care-partner.

## 2016-03-30 ENCOUNTER — Telehealth: Payer: Self-pay | Admitting: *Deleted

## 2016-03-30 NOTE — Telephone Encounter (Signed)
  Follow up Call-  Call back number 03/29/2016  Post procedure Call Back phone  # [phone]  Permission to leave phone message Yes  Some recent data might be hidden     Patient questions:  Do you have a fever, pain , or abdominal swelling? No. Pain Score  0 *  Have you tolerated food without any problems? Yes.    Have you been able to return to your normal activities? Yes.    Do you have any questions about your discharge instructions: Diet   No. Medications  No. Follow up visit  No.  Do you have questions or concerns about your Care? No.  Actions: * If pain score is 4 or above: No action needed, pain <4.

## 2016-04-02 ENCOUNTER — Ambulatory Visit (INDEPENDENT_AMBULATORY_CARE_PROVIDER_SITE_OTHER): Payer: Medicare Other | Admitting: Internal Medicine

## 2016-04-02 ENCOUNTER — Encounter: Payer: Self-pay | Admitting: Internal Medicine

## 2016-04-02 VITALS — BP 138/78 | HR 66 | Ht 70.0 in | Wt 169.1 lb

## 2016-04-02 DIAGNOSIS — I10 Essential (primary) hypertension: Secondary | ICD-10-CM

## 2016-04-02 DIAGNOSIS — I25810 Atherosclerosis of coronary artery bypass graft(s) without angina pectoris: Secondary | ICD-10-CM

## 2016-04-02 DIAGNOSIS — I441 Atrioventricular block, second degree: Secondary | ICD-10-CM | POA: Diagnosis not present

## 2016-04-02 LAB — CUP PACEART INCLINIC DEVICE CHECK
Battery Voltage: 2.79 V
Brady Statistic AP VS Percent: 40 %
Brady Statistic AS VP Percent: 0 %
Brady Statistic AS VS Percent: 59 %
Date Time Interrogation Session: 20180326150025
Implantable Lead Location: 753859
Implantable Lead Model: 5092
Lead Channel Impedance Value: 452 Ohm
Lead Channel Pacing Threshold Amplitude: 0.75 V
Lead Channel Pacing Threshold Amplitude: 0.75 V
Lead Channel Pacing Threshold Amplitude: 0.875 V
Lead Channel Pacing Threshold Pulse Width: 0.4 ms
Lead Channel Sensing Intrinsic Amplitude: 4 mV
Lead Channel Setting Pacing Amplitude: 2.5 V
Lead Channel Setting Pacing Pulse Width: 0.4 ms
MDC IDC LEAD IMPLANT DT: 20140321
MDC IDC LEAD IMPLANT DT: 20140321
MDC IDC LEAD LOCATION: 753860
MDC IDC MSMT BATTERY IMPEDANCE: 232 Ohm
MDC IDC MSMT BATTERY REMAINING LONGEVITY: 124 mo
MDC IDC MSMT LEADCHNL RA PACING THRESHOLD AMPLITUDE: 0.625 V
MDC IDC MSMT LEADCHNL RA PACING THRESHOLD PULSEWIDTH: 0.4 ms
MDC IDC MSMT LEADCHNL RA PACING THRESHOLD PULSEWIDTH: 0.4 ms
MDC IDC MSMT LEADCHNL RV IMPEDANCE VALUE: 620 Ohm
MDC IDC MSMT LEADCHNL RV PACING THRESHOLD PULSEWIDTH: 0.4 ms
MDC IDC MSMT LEADCHNL RV SENSING INTR AMPL: 22.4 mV
MDC IDC PG IMPLANT DT: 20140321
MDC IDC SET LEADCHNL RA PACING AMPLITUDE: 2 V
MDC IDC SET LEADCHNL RV SENSING SENSITIVITY: 5.6 mV
MDC IDC STAT BRADY AP VP PERCENT: 0 %

## 2016-04-02 NOTE — Progress Notes (Signed)
Electrophysiology Office Note   Date:  04/02/2016   ID:  [name], DOB 08/22/1926, MRN [id number]  PCP:  [name], MD  Cardiologist:  Dr Burt Knack Primary Electrophysiologist: Thompson Grayer, MD    No chief complaint on file.    History of Present Illness: [name] is a 83 y.o. male who presents today for electrophysiology evaluation.   He is doing very well.  Remains active. 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 Today, he denies symptoms of palpitations, chest pain, shortness of breath, orthopnea, PND, lower extremity edema, claudication, dizziness, presyncope, syncope, bleeding, or neurologic sequela. The patient is tolerating medications without difficulties and is otherwise without complaint today.    Past Medical History:  Diagnosis Date  . Acute superficial venous thrombosis of lower extremity    a. RLE after CABG, neg dopp for DVT.  [name] Allergy   . Atrial fibrillation ([place])    a. Post-op from CABG, on amiodarone temporarily, d/c'd 12/2011  . CAD (coronary artery disease)    a. S/P stenting to mid RCA, prox PDA 06/1999. b. NSTEMI/CABG x 3 in 10/2011 with LIMA to LAD, SVG to PDA, and SVG to OM1.   . Chronic UTI    a. Followed by Dr. Risa Grill - colonized/asymptomatic - not on abx  . CKD (chronic kidney disease), stage III    a. stable with a creatinine around 1.9-2.0 followed by nephrology.  . Dyslipidemia   . GERD (gastroesophageal reflux disease)   . Hypertension    well-controlled.  . Myocardial infarction   . Neck injury    a. C3-C4 and C4-C5 foraminal narrowing, severe  . Prostate cancer ([place])    a. 2001 s/p TURP.  . Pulmonary nodule    a. felt to be noncancerous.  Status post followup CT scan 4 mm and stable.  . Renal artery stenosis ([place])    a. 50% by cath 2001  . 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  cardia catherization  07-07-99  . CARDIAC SURGERY  10/18/12   open heart surgery  . CATARACT EXTRACTION W/ INTRAOCULAR LENS  IMPLANT, BILATERAL  3/205, 06/2013   [facility]  . COLONOSCOPY  04/12/07  . CORONARY ARTERY BYPASS GRAFT  10/19/2011   Procedure: CORONARY ARTERY BYPASS GRAFTING (CABG);  Surgeon: Gaye Pollack, MD;  Location: [facility];  Service: Open Heart Surgery;  Laterality: N/A;  times three using Left Internal Mammary Artery and Right Greater Saphenouse Vein Graft harvested Endoscopically  . edg  07-17-1994  . FLEXIBLE SIGMOIDOSCOPY  11-03-1997  . LEFT HEART CATHETERIZATION WITH CORONARY ANGIOGRAM N/A 10/16/2011   Procedure: LEFT HEART CATHETERIZATION WITH CORONARY ANGIOGRAM;  Surgeon: Peter M Martinique, MD;  Location: [facility] CATH LAB;  Service: Cardiovascular;  Laterality: N/A;  . LEFT HEART CATHETERIZATION WITH CORONARY/GRAFT ANGIOGRAM N/A 03/11/2013   Procedure: LEFT HEART CATHETERIZATION WITH [name];  Surgeon: Blane Ohara, MD;  Location: [facility] CATH LAB;  Service: Cardiovascular;  Laterality: N/A;  . lumbar spinal disk and neck fusion surgery    . PACEMAKER INSERTION  03/28/12   PPM implanted for mobitz II AV block  . peripheral vascular catherization  11-24-03  . PERMANENT PACEMAKER INSERTION N/A 03/28/2012   Procedure: PERMANENT PACEMAKER INSERTION;  Surgeon: Thompson Grayer, MD;  Location: [facility] CATH LAB;  Service: Cardiovascular;  Laterality: N/A;  .  PROSTATECTOMY    . renal circulation  10-01-03  . s/p ptca    . stents     X 2  . stress cardiolite  05-04-05   spring 09-negative except for apical thinning, EF 68%     Current Outpatient Prescriptions  Medication Sig Dispense Refill  . aspirin EC 81 MG tablet Take 1 tablet (81 mg total) by mouth daily.    . carboxymethylcellulose (REFRESH PLUS) 0.5 % SOLN Place 1 drop into both eyes daily as needed (dry eyes). 30 mL 1  . clobetasol ointment (TEMOVATE) 0.05 %     . DiphenhydrAMINE HCl, Sleep, (ZZZQUIL PO) Take 1 Dose by mouth  at bedtime as needed (sleep).    . ergocalciferol (VITAMIN D2) 50000 UNITS capsule Take 50,000 Units by mouth every 30 (thirty) days. 15 th of each month    . folic acid (FOLVITE) 1 MG tablet Take 1 tablet (1 mg total) by mouth daily. 90 tablet 3  . Multiple Vitamins-Minerals (PRESERVISION AREDS PO) Take by mouth daily.    . nitroGLYCERIN (NITROSTAT) 0.4 MG SL tablet DISSOLVE 1 TABLET UNDER THE TONGUE EVERY 5 MINUTES AS NEEDED FOR CHEST PAIN (MAX OF 3 TABLETS) 100 tablet 0  . Probiotic Product (RA PROBIOTIC COMPLEX) CAPS Take 1 capsule by mouth 2 (two) times daily.  0  . PROCTOSOL HC 2.5 % rectal cream apply 1 APPLICATION rectally twice a day  0  . simvastatin (ZOCOR) 20 MG tablet TAKE 1 TABLET DAILY 90 tablet 2  . TOPROL XL 25 MG 24 hr tablet TAKE 1 TABLET TWICE A DAY 180 tablet 2  . vitamin B-12 (CYANOCOBALAMIN) 500 MCG tablet Take 500 mcg by mouth daily. Take one tablet every 3rd day    . vitamin C (ASCORBIC ACID) 500 MG tablet Take 1,500 mg by mouth daily.     Current Facility-Administered Medications  Medication Dose Route Frequency Provider Last Rate Last Dose  . 0.9 %  sodium chloride infusion  500 mL Intravenous Continuous Nelida Meuse III, MD        Allergies:   Amoxicillin; Aspirin; Atarax [hydroxyzine hcl]; Ciprofloxacin; Codeine; Hydrocodone; Hydroxyzine; Macrobid [nitrofurantoin macrocrystal]; Niacin; Niacin-lovastatin er; Nitrofurantoin; Trimox [amoxicillin trihydrate]; and Bactrim [sulfamethoxazole-trimethoprim]   Social History:  The patient  reports that he has never smoked. He has never used smokeless tobacco. He reports that he drinks alcohol. He reports that he does not use drugs.   Family History:  The patient's family history includes Arthritis in his mother; Breast cancer in his other; Cancer in his brother; Coronary artery disease in his father; Heart disease in his brother; Nephritis in his brother; Other in his brother and mother; Prostate cancer in his brother.     ROS:  Please see the history of present illness.   All other systems are reviewed and negative.    PHYSICAL EXAM: VS:  BP 138/78   Pulse 66   Ht 5\' 10"  (1.778 m)   Wt 169 lb 2 oz (76.7 kg)   SpO2 98%   BMI 24.27 kg/m  , BMI Body mass index is 24.27 kg/m. GEN: Well nourished, well developed, in no acute distress  HEENT: normal  Neck: no JVD, carotid bruits, or masses Cardiac: RRR; no murmurs, rubs, or gallops,no edema  Respiratory:  clear to auscultation bilaterally, normal work of breathing GI: soft, nontender, nondistended, + BS MS: no deformity or atrophy  Skin: warm and dry, device pocket is well healed Neuro:  Strength and sensation are intact Psych:  euthymic mood, full affect Wearing his Rolex that he got in [place] interrogation is personally reviewed today in detail.  See PaceArt for details.   Recent Labs: 09/01/2015: ALT 11; BUN 28; Creatinine, Ser 1.83; Hemoglobin 14.7; Platelets 222.0; Potassium 4.8; Sodium 139; TSH 2.55    Lipid Panel     Component Value Date/Time   CHOL 136 09/01/2015 1234   TRIG 124.0 09/01/2015 1234   TRIG 107 01/16/2006 1338   HDL 41.40 09/01/2015 1234   CHOLHDL 3 09/01/2015 1234   VLDL 24.8 09/01/2015 1234   LDLCALC 70 09/01/2015 1234     Wt Readings from Last 3 Encounters:  04/02/16 169 lb 2 oz (76.7 kg)  03/29/16 166 lb (75.3 kg)  03/15/16 166 lb (75.3 kg)    ASSESSMENT AND PLAN:  1. Mobitz II AV block Normal pacemaker function See Pace Art report No changes today  2. CAD Stable No change required today  3. HTN Stable No change required today  [facility]link Return in 1 year to see me  Current medicines are reviewed at length with the patient today.   The patient does not have concerns regarding his medicines.  The following changes were made today:  none  Signed, Thompson Grayer, MD  04/02/2016 12:11 PM     [facility] [address] [place] [address] [phone]  (office) [phone] (fax)

## 2016-04-02 NOTE — Patient Instructions (Signed)
Medication Instructions:  Your physician recommends that you continue on your current medications as directed. Please refer to the Current Medication list given to you today.   Labwork: None ordered   Testing/Procedures: None ordered   Follow-Up: Your physician wants you to follow-up in: 12 months with Dr Allred You will receive a reminder letter in the mail two months in advance. If you don't receive a letter, please call our office to schedule the follow-up appointment.  Remote monitoring is used to monitor your Pacemaker from home. This monitoring reduces the number of office visits required to check your device to one time per year. It allows us to keep an eye on the functioning of your device to ensure it is working properly. You are scheduled for a device check from home on 07/02/16. You may send your transmission at any time that day. If you have a wireless device, the transmission will be sent automatically. After your physician reviews your transmission, you will receive a postcard with your next transmission date.     Any Other Special Instructions Will Be Listed Below (If Applicable).     If you need a refill on your cardiac medications before your next appointment, please call your pharmacy.   

## 2016-04-09 ENCOUNTER — Encounter: Payer: Self-pay | Admitting: Internal Medicine

## 2016-04-09 ENCOUNTER — Ambulatory Visit (INDEPENDENT_AMBULATORY_CARE_PROVIDER_SITE_OTHER): Payer: Medicare Other | Admitting: Internal Medicine

## 2016-04-09 VITALS — BP 140/76 | HR 67 | Temp 98.8°F | Resp 16 | Wt 169.0 lb

## 2016-04-09 DIAGNOSIS — R21 Rash and other nonspecific skin eruption: Secondary | ICD-10-CM | POA: Insufficient documentation

## 2016-04-09 DIAGNOSIS — I25810 Atherosclerosis of coronary artery bypass graft(s) without angina pectoris: Secondary | ICD-10-CM

## 2016-04-09 NOTE — Progress Notes (Signed)
Pre visit review using our clinic review tool, if applicable. No additional management support is needed unless otherwise documented below in the visit note. 

## 2016-04-09 NOTE — Progress Notes (Signed)
Subjective:    Patient ID: [name], male    DOB: 08-Jul-1926, 83 y.o.   MRN: [id number]  HPI He is here for an acute visit.   In January he had a dental infection and was given antibiotics.  He developed diarrhea. He was put on a different antibiotic after a few days and the diarrhea got worse.     He was seen two months ago for loose stools. He was put on probiotics and that helped.   He was seen one month ago for anal irritation.  He has tried anusol and clobetasol cream.  He was advised sitz baths and tucks pads.  The clobetasol helped, but only transiently.  His stools are now normal.    The area is raw and irritated.  He denies itching.    Medications and allergies reviewed with patient and updated if appropriate.  Patient Active Problem List   Diagnosis Date Noted  . Loose stools 02/06/2016  . Dysphagia 02/06/2016  . Medication management 02/06/2016  . Cephalalgia 07/14/2015  . Constipation 03/01/2015  . Claudication of left lower extremity ([place]) 03/02/2014  . 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 Symptomatic bradycardia 03/26/2012  . Pulmonary nodule   . CAD - CABG Oct 2013   . Chronic renal impairment, stage 3 (moderate)   . Venous insufficiency of leg 06/05/2010  . PERSISTENT DISORDER INITIATING/MAINTAINING SLEEP 11/09/2009  . SHOULDER PAIN, RIGHT, CHRONIC 10/14/2009  . B12 nutritional deficiency 08/23/2009  . Peripheral neuropathy ([place]) 03/21/2009  . DEGENERATIVE DISC DISEASE, CERVICAL SPINE, W/RADICULOPATHY 09/21/2008  . Dyslipidemia 01/17/2007  . Essential hypertension 07/20/2006  . PROSTATE CANCER, HX OF- surgery 2001 07/20/2006    Current Outpatient Prescriptions on File Prior to Visit  Medication Sig Dispense Refill  . aspirin EC 81 MG tablet Take 1 tablet (81 mg total) by mouth daily.    . carboxymethylcellulose (REFRESH PLUS) 0.5 % SOLN Place 1 drop into both eyes daily as needed (dry eyes). 30 mL 1  . clobetasol  ointment (TEMOVATE) 0.05 %     . DiphenhydrAMINE HCl, Sleep, (ZZZQUIL PO) Take 1 Dose by mouth at bedtime as needed (sleep).    . ergocalciferol (VITAMIN D2) 50000 UNITS capsule Take 50,000 Units by mouth every 30 (thirty) days. 15 th of each month    . folic acid (FOLVITE) 1 MG tablet Take 1 tablet (1 mg total) by mouth daily. 90 tablet 3  . Multiple Vitamins-Minerals (PRESERVISION AREDS PO) Take by mouth daily.    . nitroGLYCERIN (NITROSTAT) 0.4 MG SL tablet DISSOLVE 1 TABLET UNDER THE TONGUE EVERY 5 MINUTES AS NEEDED FOR CHEST PAIN (MAX OF 3 TABLETS) 100 tablet 0  . simvastatin (ZOCOR) 20 MG tablet TAKE 1 TABLET DAILY 90 tablet 2  . TOPROL XL 25 MG 24 hr tablet TAKE 1 TABLET TWICE A DAY 180 tablet 2  . vitamin B-12 (CYANOCOBALAMIN) 500 MCG tablet Take 500 mcg by mouth daily. Take one tablet every 3rd day    . vitamin C (ASCORBIC ACID) 500 MG tablet Take 1,500 mg by mouth daily.     Current Facility-Administered Medications on File Prior to Visit  Medication Dose Route Frequency Provider Last Rate Last Dose  . 0.9 %  sodium chloride infusion  500 mL Intravenous Continuous Doran Stabler, MD        Past Medical History:  Diagnosis Date  . Acute superficial venous thrombosis of lower extremity    a. RLE  after CABG, neg dopp for DVT.  [name] Allergy   . Atrial fibrillation ([place])    a. Post-op from CABG, on amiodarone temporarily, d/c'd 12/2011  . CAD (coronary artery disease)    a. S/P stenting to mid RCA, prox PDA 06/1999. b. NSTEMI/CABG x 3 in 10/2011 with LIMA to LAD, SVG to PDA, and SVG to OM1.   . Chronic UTI    a. Followed by Dr. Risa Grill - colonized/asymptomatic - not on abx  . CKD (chronic kidney disease), stage III    a. stable with a creatinine around 1.9-2.0 followed by nephrology.  . Dyslipidemia   . GERD (gastroesophageal reflux disease)   . Hypertension    well-controlled.  . Myocardial infarction   . Neck injury    a. C3-C4 and C4-C5 foraminal narrowing, severe  .  Prostate cancer ([place])    a. 2001 s/p TURP.  . Pulmonary nodule    a. felt to be noncancerous.  Status post followup CT scan 4 mm and stable.  . Renal artery stenosis ([place])    a. 50% by cath 2001  . 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 cardia catherization  07-07-99  . CARDIAC SURGERY  10/18/12   open heart surgery  . CATARACT EXTRACTION W/ INTRAOCULAR LENS  IMPLANT, BILATERAL  3/205, 06/2013   [facility]  . COLONOSCOPY  04/12/07  . CORONARY ARTERY BYPASS GRAFT  10/19/2011   Procedure: CORONARY ARTERY BYPASS GRAFTING (CABG);  Surgeon: Gaye Pollack, MD;  Location: [facility];  Service: Open Heart Surgery;  Laterality: N/A;  times three using Left Internal Mammary Artery and Right Greater Saphenouse Vein Graft harvested Endoscopically  . edg  07-17-1994  . FLEXIBLE SIGMOIDOSCOPY  11-03-1997  . LEFT HEART CATHETERIZATION WITH CORONARY ANGIOGRAM N/A 10/16/2011   Procedure: LEFT HEART CATHETERIZATION WITH CORONARY ANGIOGRAM;  Surgeon: Peter M Martinique, MD;  Location: [facility] CATH LAB;  Service: Cardiovascular;  Laterality: N/A;  . LEFT HEART CATHETERIZATION WITH CORONARY/GRAFT ANGIOGRAM N/A 03/11/2013   Procedure: LEFT HEART CATHETERIZATION WITH [name];  Surgeon: Blane Ohara, MD;  Location: [facility] CATH LAB;  Service: Cardiovascular;  Laterality: N/A;  . lumbar spinal disk and neck fusion surgery    . PACEMAKER INSERTION  03/28/12   PPM implanted for mobitz II AV block  . peripheral vascular catherization  11-24-03  . PERMANENT PACEMAKER INSERTION N/A 03/28/2012   Procedure: PERMANENT PACEMAKER INSERTION;  Surgeon: Thompson Grayer, MD;  Location: [facility] CATH LAB;  Service: Cardiovascular;  Laterality: N/A;  . PROSTATECTOMY    . renal circulation  10-01-03  . s/p ptca    . stents     X 2  . stress cardiolite  05-04-05   spring 09-negative except for apical thinning, EF 68%    Social History   Social  History  . Marital status: Married    Spouse name: [name]  . Number of children: 2  . Years of education: N/A   Occupational History  . 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  Retired   Social History Main Topics  . Smoking status: Never Smoker  . Smokeless tobacco: Never Used  . Alcohol use Yes     Comment: Rarely  . Drug use: No  . Sexual activity: Not on file  Other Topics Concern  . Not on file   Social History Narrative   HSG, 1 year college.  married '52 - 3 years, divorced; married '56 - 3 years divorced; married '50-12 yrs - divorced; married '75 -. 1 son '57; 1 daughter - '53; 1 grandchild.  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  Very happily married.  End of life care: yes CPR, no long term mechanical ventilation, no heroic measures.    Family History  Problem Relation Age of Onset  . Coronary artery disease Father     died @ 5  . Other Mother     cerebral hemorrhage - died @ 63  . Arthritis Mother   . Cancer Brother     Bladder  . Prostate cancer Brother   . Nephritis Brother     died @ age 59.  . Other Brother     cerebral hemorrhage - died @ 66  . Heart disease Brother   . Breast cancer Other     niece x 2  . Diabetes Neg Hx   . Colon cancer Neg Hx     Review of Systems  Constitutional: Negative for chills and fever.  Gastrointestinal: Negative for anal bleeding, blood in stool, constipation and diarrhea.  Skin: Positive for color change and rash. Negative for wound.       No itch or pain - just irritation in perineum region       Objective:   Vitals:   04/09/16 1333  BP: 140/76  Pulse: 67  Resp: 16  Temp: 98.8 F (37.1 C)   Filed Weights   04/09/16 1333  Weight: 169 lb (76.7 kg)   Body mass index is 24.25 kg/m.  Wt Readings from Last 3 Encounters:  04/09/16 169 lb (76.7 kg)  04/02/16 169 lb 2 oz  (76.7 kg)  03/29/16 166 lb (75.3 kg)     Physical Exam  Constitutional: He appears well-developed and well-nourished. No distress.  Skin: He is not diaphoretic. There is erythema.  Mild erythema in perineum without skin breakdown, no ulcers or open wound, anal skin tags - not inflammed, perianal induration          Assessment & Plan:   See Problem List for Assessment and Plan of chronic medical problems.

## 2016-04-09 NOTE — Assessment & Plan Note (Signed)
Irritation secondary to loose stools, which have resolved Some temporary improvement with clobetasol - has been using it for one month Stop clobetasol Start Desitin or A and D ointment Call if no improvement - fungal infection does not seem likely but can consider anti-fungal cream Will refer to derm in case there is no improvement

## 2016-04-09 NOTE — Patient Instructions (Signed)
Try Desitin or A and D ointment for your rash.  I have referred you to dermatology just incase the rash does not improve.  If no improvement let me know and we can try something different next week.

## 2016-04-11 ENCOUNTER — Ambulatory Visit (INDEPENDENT_AMBULATORY_CARE_PROVIDER_SITE_OTHER): Payer: Medicare Other | Admitting: Emergency Medicine

## 2016-04-11 DIAGNOSIS — Z23 Encounter for immunization: Secondary | ICD-10-CM | POA: Diagnosis not present

## 2016-05-14 ENCOUNTER — Telehealth: Payer: Self-pay | Admitting: Cardiovascular Disease

## 2016-05-14 NOTE — Telephone Encounter (Signed)
New Message     Pt c/o BP issue: STAT if pt c/o blurred vision, one-sided weakness or slurred speech  1. What are your last 5 BP readings? 182/95 Saturday, 159/82 Sunday  166/90 today   2. Are you having any other symptoms (ex. Dizziness, headache, blurred vision, passed out)?  headache  3. What is your BP issue? Would like to discuss this with a nurse

## 2016-05-14 NOTE — Telephone Encounter (Signed)
Would add amlodipine 5 mg to his medical regimen

## 2016-05-14 NOTE — Telephone Encounter (Signed)
I spoke with the pt and he has not been feeling well and complained of a dull headache and fatigue.  This prompted the pt to check his BP (see earlier note for readings) which has been elevated.  The pt has taken tylenol for headache and he has relief for about 2 hours but headache returns.  The pt is currently taking Metoprolol Succinate 25mg  twice a day. I will forward this information to Dr Burt Knack for review and further recommendations.

## 2016-05-15 MED ORDER — AMLODIPINE BESYLATE 5 MG PO TABS: 5.0000 mg | ORAL_TABLET | Freq: Every day | ORAL | 3 refills | Status: DC

## 2016-05-15 NOTE — Telephone Encounter (Signed)
I spoke with the pt and made him aware of medication change. Rx sent to the pharmacy. The pt will continue to monitor BP at home and will contact the office if his BP remains consistently greater than 130-140/80-90. Pt agreed with plan.

## 2016-05-30 ENCOUNTER — Ambulatory Visit (INDEPENDENT_AMBULATORY_CARE_PROVIDER_SITE_OTHER): Payer: Medicare Other | Admitting: Cardiovascular Disease

## 2016-05-30 ENCOUNTER — Encounter: Payer: Self-pay | Admitting: Cardiovascular Disease

## 2016-05-30 VITALS — BP 120/70 | HR 64 | Ht 71.0 in | Wt 172.0 lb

## 2016-05-30 DIAGNOSIS — R5383 Other fatigue: Secondary | ICD-10-CM | POA: Diagnosis not present

## 2016-05-30 DIAGNOSIS — R0602 Shortness of breath: Secondary | ICD-10-CM

## 2016-05-30 DIAGNOSIS — I25810 Atherosclerosis of coronary artery bypass graft(s) without angina pectoris: Secondary | ICD-10-CM | POA: Diagnosis not present

## 2016-05-30 NOTE — Patient Instructions (Signed)

## 2016-05-30 NOTE — Progress Notes (Signed)
Cardiology Office Note Date:  05/30/2016   ID:  [name], DOB Apr 17, 1926, MRN [id number]  PCP:  [name], MD  Cardiologist:  [name], MD    Chief Complaint  Patient presents with  . Coronary Artery Disease    f/u. Pt c/o headaches. states they come and go     History of Present Illness: [name] is a 83 y.o. male who presents for follow-up evaluation.  He underwent multivessel CABG in 2013 after presenting with non-ST elevation MI. He had previously undergone coronary stenting dating back to 2001. He was found to have severe three-vessel obstructive disease. Left ventricular function was normal. He was bypassed with the LIMA to LAD, vein graft obtuse marginal, and vein graft to PDA. He also required permanent pacing in 2014 after presenting with second degree type II AV block. Cardiac catheterization was performed in 2015 after an abnormal nuclear scan.  This demonstrated continued patency of the LIMA to LAD graft and early graft failure of the saphenous vein grafts. The patient had collateral filling of the RCA distribution and moderate stenosis of the native left circumflex. Medical therapy was recommended. Comorbid medical conditions include chronic kidney disease.  He has been having problems with intermittent headache. Also has noted elevated BP readings. He called in a few weeks ago with BP readings and amlodipine was added with improvement in the blood pressure.   Here with his wife today. He also complains of fatigue and shortness of breath with activity. No orthopnea or PND. He's having problems with his knees and he's going for an injection in the hip and knees scheduled next month. He doesn't feel limited by shortness of breath. Primary complaint is fatigue. Denies chest pain.   Past Medical History:  Diagnosis Date  . Acute superficial venous thrombosis of lower extremity    a. RLE after CABG, neg dopp for DVT.  [name] Allergy   . Atrial  fibrillation ([place])    a. Post-op from CABG, on amiodarone temporarily, d/c'd 12/2011  . CAD (coronary artery disease)    a. S/P stenting to mid RCA, prox PDA 06/1999. b. NSTEMI/CABG x 3 in 10/2011 with LIMA to LAD, SVG to PDA, and SVG to OM1.   . Chronic UTI    a. Followed by Dr. Risa Grill - colonized/asymptomatic - not on abx  . CKD (chronic kidney disease), stage III    a. stable with a creatinine around 1.9-2.0 followed by nephrology.  . Dyslipidemia   . GERD (gastroesophageal reflux disease)   . Hypertension    well-controlled.  . Myocardial infarction ([name])   . Neck injury    a. C3-C4 and C4-C5 foraminal narrowing, severe  . Prostate cancer ([name])    a. 2001 s/p TURP.  . Pulmonary nodule    a. felt to be noncancerous.  Status post followup CT scan 4 mm and stable.  . Renal artery stenosis ([name])    a. 50% by cath 2001  . 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 cardia catherization  07-07-99  . CARDIAC SURGERY  10/18/12   open heart surgery  . CATARACT EXTRACTION W/ INTRAOCULAR LENS  IMPLANT, BILATERAL  3/205, 06/2013   m[facility]uen  . COLONOSCOPY  04/12/07  . CORONARY ARTERY BYPASS GRAFT  10/19/2011   Procedure: CORONARY ARTERY BYPASS GRAFTING (CABG);  Surgeon: Gaye Pollack, MD;  Location: [facility];  Service: Open Heart Surgery;  Laterality: N/A;  times three using Left Internal Mammary Artery and Right Greater Saphenouse Vein Graft harvested Endoscopically  . edg  07-17-1994  . FLEXIBLE SIGMOIDOSCOPY  11-03-1997  . LEFT HEART CATHETERIZATION WITH CORONARY ANGIOGRAM N/A 10/16/2011   Procedure: LEFT HEART CATHETERIZATION WITH CORONARY ANGIOGRAM;  Surgeon: Peter M Martinique, MD;  Location: [facility] CATH LAB;  Service: Cardiovascular;  Laterality: N/A;  . LEFT HEART CATHETERIZATION WITH CORONARY/GRAFT ANGIOGRAM N/A 03/11/2013   Procedure: LEFT HEART CATHETERIZATION WITH [name];  Surgeon: Blane Ohara, MD;  Location: [facility] CATH LAB;  Service: Cardiovascular;  Laterality: N/A;  . lumbar spinal disk and neck fusion surgery    . PACEMAKER INSERTION  03/28/12   PPM implanted for mobitz II AV block  . peripheral vascular catherization  11-24-03  . PERMANENT PACEMAKER INSERTION N/A 03/28/2012   Procedure: PERMANENT PACEMAKER INSERTION;  Surgeon: Thompson Grayer, MD;  Location: [facility] CATH LAB;  Service: Cardiovascular;  Laterality: N/A;  . PROSTATECTOMY    . renal circulation  10-01-03  . s/p ptca    . stents     X 2  . stress cardiolite  05-04-05   spring 09-negative except for apical thinning, EF 68%    Current Outpatient Prescriptions  Medication Sig Dispense Refill  . amLODipine (NORVASC) 5 MG tablet Take 1 tablet (5 mg total) by mouth daily. 90 tablet 3  . aspirin EC 81 MG tablet Take 1 tablet (81 mg total) by mouth daily.    . carboxymethylcellulose (REFRESH PLUS) 0.5 % SOLN Place 1 drop into both eyes daily as needed (dry eyes). 30 mL 1  . DiphenhydrAMINE HCl, Sleep, (ZZZQUIL PO) Take 1 Dose by mouth at bedtime as needed (sleep).    . ergocalciferol (VITAMIN D2) 50000 UNITS capsule Take 50,000 Units by mouth every 30 (thirty) days. 15 th of each month    . folic acid (FOLVITE) 1 MG tablet Take 1 tablet (1 mg total) by mouth daily. 90 tablet 3  . ketoconazole (NIZORAL) 2 % cream Apply 1 application topically 2 (two) times daily.  0  . metoprolol succinate (TOPROL-XL) 25 MG 24 hr tablet Take 25 mg by mouth 2 (two) times daily.    . Multiple Vitamins-Minerals (PRESERVISION AREDS PO) Take 1 tablet by mouth daily.     . nitroGLYCERIN (NITROSTAT) 0.4 MG SL tablet DISSOLVE 1 TABLET UNDER THE TONGUE EVERY 5 MINUTES AS NEEDED FOR CHEST PAIN (MAX OF 3 TABLETS) 100 tablet 0  . simvastatin (ZOCOR) 20 MG tablet Take 20 mg by mouth daily.    [name] SSD 1 % cream Apply 1 application topically 2 (two) times daily.  0  . vitamin B-12 (CYANOCOBALAMIN) 500 MCG tablet Take 500 mcg by mouth daily. Take one tablet  every 3rd day    . vitamin C (ASCORBIC ACID) 500 MG tablet Take 1,500 mg by mouth daily.    . Zinc Oxide (DESITIN MAXIMUM STRENGTH EX) Apply 1 Dose topically as directed.     Current Facility-Administered Medications  Medication Dose Route Frequency Provider Last Rate Last Dose  . 0.9 %  sodium chloride infusion  500 mL Intravenous Continuous Danis, Estill Cotta III, MD        Allergies:   Amoxicillin; Aspirin; Atarax [hydroxyzine hcl]; Cephalexin; Ciprofloxacin; Clobetasol; Codeine; Hydrocodone; Hydroxyzine; Macrobid [nitrofurantoin macrocrystal]; Niacin; Niacin-lovastatin er; Nitrofurantoin; Trimox [amoxicillin trihydrate]; and Bactrim [sulfamethoxazole-trimethoprim]   Social History:  The patient  reports that he has never smoked. He has never used smokeless  tobacco. He reports that he drinks alcohol. He reports that he does not use drugs.   Family History:  The patient's  family history includes Arthritis in his mother; Breast cancer in his other; Cancer in his brother; Coronary artery disease in his father; Heart disease in his brother; Nephritis in his brother; Other in his brother and mother; Prostate cancer in his brother.    ROS:  Please see the history of present illness.  Otherwise, review of systems is positive for exertional dyspnea, depression, headaches, leg pain.  All other systems are reviewed and negative.    PHYSICAL EXAM: VS:  BP 120/70   Pulse 64   Ht 5\' 11"  (1.803 m)   Wt 172 lb (78 kg)   BMI 23.99 kg/m  , BMI Body mass index is 23.99 kg/m. GEN: Well nourished, well developed, in no acute distress  HEENT: normal  Neck: no JVD, no masses. No carotid bruits Cardiac: RRR without murmur or gallop                Respiratory:  clear to auscultation bilaterally, normal work of breathing GI: soft, nontender, nondistended, + BS MS: no deformity or atrophy  Ext: no pretibial edema, pedal pulses 2+= bilaterally Skin: warm and dry, no rash Neuro:  Strength and sensation are  intact Psych: euthymic mood, full affect  EKG:  EKG is ordered today. The ekg ordered today shows normal sinus rhythm 64 bpm, within normal limits.  Recent Labs: 09/01/2015: ALT 11; BUN 28; Creatinine, Ser 1.83; Hemoglobin 14.7; Platelets 222.0; Potassium 4.8; Sodium 139; TSH 2.55   Lipid Panel     Component Value Date/Time   CHOL 136 09/01/2015 1234   TRIG 124.0 09/01/2015 1234   TRIG 107 01/16/2006 1338   HDL 41.40 09/01/2015 1234   CHOLHDL 3 09/01/2015 1234   VLDL 24.8 09/01/2015 1234   LDLCALC 70 09/01/2015 1234      Wt Readings from Last 3 Encounters:  05/30/16 172 lb (78 kg)  04/09/16 169 lb (76.7 kg)  04/02/16 169 lb 2 oz (76.7 kg)    ASSESSMENT AND PLAN: 1.  CAD, native vessel and saphenous vein graft disease: The patient appears to be stable. We'll continue his current regimen.  2. Hyperlipidemia: Most recent lipids are reviewed and at goal. Continue simvastatin.  3. AV block status post permanent pacemaker placement: Followed by Dr. Rayann Heman.  4. Hypertension with chronic kidney disease: Blood pressure better controlled with the addition of amlodipine. He brings in readings today and they are reviewed.  5. Shortness of breath/fatigue: Reviewed previous cardiac studies. Will check an echocardiogram for further evaluation. Previously has had normal LV function. No exam findings of congestive heart failure.  Current medicines are reviewed with the patient today.  The patient does not have concerns regarding medicines.  Labs/ tests ordered today include:   Orders Placed This Encounter  Procedures  . ECHOCARDIOGRAM COMPLETE    Disposition:   FU one year  Signed, [name], MD  05/30/2016 5:53 PM    [address], [place], [place]  [address] Phone: [phone]; Fax: [phone]

## 2016-05-31 ENCOUNTER — Other Ambulatory Visit: Payer: Self-pay

## 2016-05-31 DIAGNOSIS — R0602 Shortness of breath: Secondary | ICD-10-CM

## 2016-06-05 ENCOUNTER — Telehealth: Payer: Self-pay | Admitting: Internal Medicine

## 2016-06-05 DIAGNOSIS — R21 Rash and other nonspecific skin eruption: Secondary | ICD-10-CM

## 2016-06-05 NOTE — Telephone Encounter (Signed)
Patient states the rash on his butt is getting worse. He states he saw the demonologist, but did not get any help. He would like a c-diff test done. He goes out of town today. He states he would like to get some help today on this issues. Thank you.

## 2016-06-05 NOTE — Telephone Encounter (Signed)
Referral ordered

## 2016-06-05 NOTE — Telephone Encounter (Signed)
Spoke with pt, states he is not having a watery stool. he states the skin is Rough around tail bone, denies open sore, states the area is dry and rough. He would like [place] to send a referral to Dr Nevada Crane on [facility] with dermatology, he was not please with the derm office he last visited this month. He Will return from out of town on the 5th of June. I advise for him to call the derm office he saw this month and see if there was something they could do until he was able to be seen again.  PLease advise on referral

## 2016-06-05 NOTE — Telephone Encounter (Signed)
If he is having diarrhea we can check for cdiff - but if the stool is not watery they will not check for it.   I am not sure what to advise on the rash if the dermatologist was not able to help.

## 2016-06-14 ENCOUNTER — Other Ambulatory Visit: Payer: Self-pay | Admitting: Physician Assistant

## 2016-06-14 DIAGNOSIS — M545 Low back pain, unspecified: Secondary | ICD-10-CM

## 2016-06-14 DIAGNOSIS — M5440 Lumbago with sciatica, unspecified side: Principal | ICD-10-CM

## 2016-06-15 ENCOUNTER — Other Ambulatory Visit: Payer: Self-pay

## 2016-06-15 ENCOUNTER — Ambulatory Visit (HOSPITAL_COMMUNITY): Payer: Medicare Other | Attending: Cardiovascular Disease

## 2016-06-15 DIAGNOSIS — R5383 Other fatigue: Secondary | ICD-10-CM | POA: Diagnosis not present

## 2016-06-15 DIAGNOSIS — I441 Atrioventricular block, second degree: Secondary | ICD-10-CM | POA: Diagnosis not present

## 2016-06-15 DIAGNOSIS — E785 Hyperlipidemia, unspecified: Secondary | ICD-10-CM | POA: Insufficient documentation

## 2016-06-15 DIAGNOSIS — Z951 Presence of aortocoronary bypass graft: Secondary | ICD-10-CM | POA: Diagnosis not present

## 2016-06-15 DIAGNOSIS — I252 Old myocardial infarction: Secondary | ICD-10-CM | POA: Diagnosis not present

## 2016-06-15 DIAGNOSIS — I340 Nonrheumatic mitral (valve) insufficiency: Secondary | ICD-10-CM | POA: Insufficient documentation

## 2016-06-15 DIAGNOSIS — R0602 Shortness of breath: Secondary | ICD-10-CM

## 2016-06-15 DIAGNOSIS — I119 Hypertensive heart disease without heart failure: Secondary | ICD-10-CM | POA: Insufficient documentation

## 2016-06-15 DIAGNOSIS — I2510 Atherosclerotic heart disease of native coronary artery without angina pectoris: Secondary | ICD-10-CM | POA: Diagnosis not present

## 2016-06-15 DIAGNOSIS — Z950 Presence of cardiac pacemaker: Secondary | ICD-10-CM | POA: Diagnosis not present

## 2016-06-18 ENCOUNTER — Ambulatory Visit
Admission: RE | Admit: 2016-06-18 | Discharge: 2016-06-18 | Disposition: A | Discharge status: Home / Self Care | Payer: Medicare Other | Source: Ambulatory Visit | Attending: Physician Assistant | Admitting: Physician Assistant

## 2016-06-18 DIAGNOSIS — M545 Low back pain, unspecified: Secondary | ICD-10-CM

## 2016-06-18 DIAGNOSIS — M5440 Lumbago with sciatica, unspecified side: Principal | ICD-10-CM

## 2016-06-26 ENCOUNTER — Ambulatory Visit (INDEPENDENT_AMBULATORY_CARE_PROVIDER_SITE_OTHER): Payer: Medicare Other | Admitting: Internal Medicine

## 2016-06-26 ENCOUNTER — Encounter: Payer: Self-pay | Admitting: Internal Medicine

## 2016-06-26 VITALS — BP 100/54 | HR 68 | Temp 97.9°F | Resp 16 | Wt 172.0 lb

## 2016-06-26 DIAGNOSIS — R159 Full incontinence of feces: Secondary | ICD-10-CM

## 2016-06-26 DIAGNOSIS — I25810 Atherosclerosis of coronary artery bypass graft(s) without angina pectoris: Secondary | ICD-10-CM | POA: Diagnosis not present

## 2016-06-26 MED ORDER — HYDROCORTISONE 2.5 % EX OINT: TOPICAL_OINTMENT | CUTANEOUS | 0 refills | Status: DC

## 2016-06-26 MED ORDER — HYDROCORTISONE ACETATE 25 MG RE SUPP: 25.0000 mg | Freq: Two times a day (BID) | RECTAL | 0 refills | Status: DC

## 2016-06-26 NOTE — Assessment & Plan Note (Signed)
?   Related to hemorrhoids or loose stool Increase fiber - take supplemental fiber Trial of suppositories for internal hemorrhoids Continue hydrocortisone 2.5% externally Refer to GI

## 2016-06-26 NOTE — Progress Notes (Addendum)
Subjective:    Patient ID: [name], male    DOB: 1926-06-09, 83 y.o.   MRN: [id number]  HPI He is here for an acute visit.   Symptoms started 4-5 days ago.  He started having soft stool that would ooze out.  The stool is sometimes firm and sometimes soft.  He has had fecal incontinence - twice over the weekend.  He has discomfort with sitting and moving while sitting - it causes irritation.  The rectum area is irritated and itches after a bowel movement.  He was applying hydrocortisone 2.5% cream on the perianal rash and that helped.  He has also tried desitin.    There has been no change in medications or diet.  He denies constipation or diarrhea in general. He denies abdominal pain or blood in the stool.   Medications and allergies reviewed with patient and updated if appropriate.  Patient Active Problem List   Diagnosis Date Noted  . Rash of perineum 04/09/2016  . Loose stools 02/06/2016  . Dysphagia 02/06/2016  . Medication management 02/06/2016  . Cephalalgia 07/14/2015  . Constipation 03/01/2015  . Claudication of left lower extremity ([place]) 03/02/2014  . 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 Symptomatic bradycardia 03/26/2012  . Pulmonary nodule   . CAD - CABG Oct 2013   . Chronic renal impairment, stage 3 (moderate)   . Venous insufficiency of leg 06/05/2010  . PERSISTENT DISORDER INITIATING/MAINTAINING SLEEP 11/09/2009  . SHOULDER PAIN, RIGHT, CHRONIC 10/14/2009  . B12 nutritional deficiency 08/23/2009  . Peripheral neuropathy 03/21/2009  . DEGENERATIVE DISC DISEASE, CERVICAL SPINE, W/RADICULOPATHY 09/21/2008  . Dyslipidemia 01/17/2007  . Essential hypertension 07/20/2006  . PROSTATE CANCER, HX OF- surgery 2001 07/20/2006    Current Outpatient Prescriptions on File Prior to Visit  Medication Sig Dispense Refill  . amLODipine (NORVASC) 5 MG tablet Take 1 tablet (5 mg total) by mouth daily. 90 tablet 3  . aspirin EC 81 MG tablet  Take 1 tablet (81 mg total) by mouth daily.    . carboxymethylcellulose (REFRESH PLUS) 0.5 % SOLN Place 1 drop into both eyes daily as needed (dry eyes). 30 mL 1  . DiphenhydrAMINE HCl, Sleep, (ZZZQUIL PO) Take 1 Dose by mouth at bedtime as needed (sleep).    . ergocalciferol (VITAMIN D2) 50000 UNITS capsule Take 50,000 Units by mouth every 30 (thirty) days. 15 th of each month    . folic acid (FOLVITE) 1 MG tablet Take 1 tablet (1 mg total) by mouth daily. 90 tablet 3  . metoprolol succinate (TOPROL-XL) 25 MG 24 hr tablet Take 25 mg by mouth 2 (two) times daily.    . Multiple Vitamins-Minerals (PRESERVISION AREDS PO) Take 1 tablet by mouth daily.     . nitroGLYCERIN (NITROSTAT) 0.4 MG SL tablet DISSOLVE 1 TABLET UNDER THE TONGUE EVERY 5 MINUTES AS NEEDED FOR CHEST PAIN (MAX OF 3 TABLETS) 100 tablet 0  . simvastatin (ZOCOR) 20 MG tablet Take 20 mg by mouth daily.    . vitamin B-12 (CYANOCOBALAMIN) 500 MCG tablet Take 500 mcg by mouth daily. Take one tablet every 3rd day    . vitamin C (ASCORBIC ACID) 500 MG tablet Take 1,500 mg by mouth daily.    . Zinc Oxide (DESITIN MAXIMUM STRENGTH EX) Apply 1 Dose topically as directed.     Current Facility-Administered Medications on File Prior to Visit  Medication Dose Route Frequency Provider Last Rate Last Dose  . 0.9 %  sodium chloride infusion  500 mL Intravenous Continuous Doran Stabler, MD        Past Medical History:  Diagnosis Date  . Acute superficial venous thrombosis of lower extremity    a. RLE after CABG, neg dopp for DVT.  [name] Allergy   . Atrial fibrillation ([place])    a. Post-op from CABG, on amiodarone temporarily, d/c'd 12/2011  . CAD (coronary artery disease)    a. S/P stenting to mid RCA, prox PDA 06/1999. b. NSTEMI/CABG x 3 in 10/2011 with LIMA to LAD, SVG to PDA, and SVG to OM1.   . Chronic UTI    a. Followed by Dr. Risa Grill - colonized/asymptomatic - not on abx  . CKD (chronic kidney disease), stage III    a. stable with a  creatinine around 1.9-2.0 followed by nephrology.  . Dyslipidemia   . GERD (gastroesophageal reflux disease)   . Hypertension    well-controlled.  . Myocardial infarction ([place])   . Neck injury    a. C3-C4 and C4-C5 foraminal narrowing, severe  . Prostate cancer ([place])    a. 2001 s/p TURP.  . Pulmonary nodule    a. felt to be noncancerous.  Status post followup CT scan 4 mm and stable.  . Renal artery stenosis ([place])    a. 50% by cath 2001  . 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 cardia catherization  07-07-99  . CARDIAC SURGERY  10/18/12   open heart surgery  . CATARACT EXTRACTION W/ INTRAOCULAR LENS  IMPLANT, BILATERAL  3/205, 06/2013   m[facility]uen  . COLONOSCOPY  04/12/07  . CORONARY ARTERY BYPASS GRAFT  10/19/2011   Procedure: CORONARY ARTERY BYPASS GRAFTING (CABG);  Surgeon: Gaye Pollack, MD;  Location: [facility];  Service: Open Heart Surgery;  Laterality: N/A;  times three using Left Internal Mammary Artery and Right Greater Saphenouse Vein Graft harvested Endoscopically  . edg  07-17-1994  . FLEXIBLE SIGMOIDOSCOPY  11-03-1997  . LEFT HEART CATHETERIZATION WITH CORONARY ANGIOGRAM N/A 10/16/2011   Procedure: LEFT HEART CATHETERIZATION WITH CORONARY ANGIOGRAM;  Surgeon: Peter M Martinique, MD;  Location: [facility] CATH LAB;  Service: Cardiovascular;  Laterality: N/A;  . LEFT HEART CATHETERIZATION WITH CORONARY/GRAFT ANGIOGRAM N/A 03/11/2013   Procedure: LEFT HEART CATHETERIZATION WITH [name];  Surgeon: Blane Ohara, MD;  Location: [facility] CATH LAB;  Service: Cardiovascular;  Laterality: N/A;  . lumbar spinal disk and neck fusion surgery    . PACEMAKER INSERTION  03/28/12   PPM implanted for mobitz II AV block  . peripheral vascular catherization  11-24-03  . PERMANENT PACEMAKER INSERTION N/A 03/28/2012   Procedure: PERMANENT PACEMAKER INSERTION;  Surgeon: Thompson Grayer, MD;  Location: [facility] CATH  LAB;  Service: Cardiovascular;  Laterality: N/A;  . PROSTATECTOMY    . renal circulation  10-01-03  . s/p ptca    . stents     X 2  . stress cardiolite  05-04-05   spring 09-negative except for apical thinning, EF 68%    Social History   Social History  . Marital status: Married    Spouse name: [name]  . Number of children: 2  . Years of education: N/A   Occupational History  . 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  Retired  Social History Main Topics  . Smoking status: Never Smoker  . Smokeless tobacco: Never Used  . Alcohol use Yes     Comment: Rarely  . Drug use: No  . Sexual activity: Not Asked   Other Topics Concern  . None   Social History Narrative   HSG, 1 year college.  married '52 - 3 years, divorced; married '56 - 3 years divorced; married '14-12 yrs - divorced; married '75 -. 1 son '57; 1 daughter - '53; 1 grandchild.  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  Very happily married.  End of life care: yes CPR, no long term mechanical ventilation, no heroic measures.    Family History  Problem Relation Age of Onset  . Coronary artery disease Father        died @ 31  . Other Mother        cerebral hemorrhage - died @ 44  . Arthritis Mother   . Cancer Brother        Bladder  . Prostate cancer Brother   . Nephritis Brother        died @ age 50.  . Other Brother        cerebral hemorrhage - died @ 11  . Heart disease Brother   . Breast cancer Other        niece x 2  . Diabetes Neg Hx   . Colon cancer Neg Hx     Review of Systems  Constitutional: Negative for appetite change, chills and fever.  Gastrointestinal: Negative for abdominal pain, anal bleeding, blood in stool, constipation, diarrhea and nausea.  Neurological: Negative for light-headedness and headaches.       Objective:   Vitals:   06/26/16 1547  BP: (!)  100/54  Pulse: 68  Resp: 16  Temp: 97.9 F (36.6 C)   Filed Weights   06/26/16 1547  Weight: 172 lb (78 kg)   Body mass index is 23.99 kg/m.  Wt Readings from Last 3 Encounters:  06/26/16 172 lb (78 kg)  05/30/16 172 lb (78 kg)  04/09/16 169 lb (76.7 kg)     Physical Exam  Constitutional: He appears well-developed and well-nourished. No distress.  Abdominal: Soft. He exhibits no distension and no mass. There is no tenderness. There is no rebound and no guarding.  Genitourinary:  Genitourinary Comments: Small external hemorrhoid, not thrombosed and non tender, internal exam not done  Skin: Skin is warm and dry. He is not diaphoretic. There is erythema (mild perianal eryuthema).          Assessment & Plan:   See Problem List for Assessment and Plan of chronic medical problems.

## 2016-06-26 NOTE — Patient Instructions (Signed)
A referral was ordered GI.   Use the suppositories twice a day until they are gone.   Try taking a fiber supplement daily.   Use the steroid cream as needed for the rash.

## 2016-06-28 ENCOUNTER — Encounter: Payer: Self-pay | Admitting: Internal Medicine

## 2016-06-28 DIAGNOSIS — R159 Full incontinence of feces: Secondary | ICD-10-CM

## 2016-06-28 DIAGNOSIS — R195 Other fecal abnormalities: Secondary | ICD-10-CM

## 2016-07-02 ENCOUNTER — Ambulatory Visit (INDEPENDENT_AMBULATORY_CARE_PROVIDER_SITE_OTHER): Payer: Medicare Other | Admitting: *Deleted

## 2016-07-02 DIAGNOSIS — I441 Atrioventricular block, second degree: Secondary | ICD-10-CM

## 2016-07-02 NOTE — Progress Notes (Signed)
Remote pacemaker transmission.   

## 2016-07-04 ENCOUNTER — Encounter: Payer: Self-pay | Admitting: Cardiology

## 2016-07-04 LAB — CUP PACEART REMOTE DEVICE CHECK
Battery Impedance: 256 Ohm
Brady Statistic AP VS Percent: 30 %
Brady Statistic AS VP Percent: 0 %
Brady Statistic AS VS Percent: 70 %
Date Time Interrogation Session: 20180625130853
Implantable Lead Implant Date: 20140321
Implantable Lead Location: 753859
Implantable Lead Location: 753860
Implantable Lead Model: 5076
Lead Channel Impedance Value: 607 Ohm
Lead Channel Pacing Threshold Pulse Width: 0.4 ms
Lead Channel Setting Pacing Amplitude: 2.5 V
Lead Channel Setting Sensing Sensitivity: 5.6 mV
MDC IDC LEAD IMPLANT DT: 20140321
MDC IDC MSMT BATTERY REMAINING LONGEVITY: 122 mo
MDC IDC MSMT BATTERY VOLTAGE: 2.79 V
MDC IDC MSMT LEADCHNL RA IMPEDANCE VALUE: 441 Ohm
MDC IDC MSMT LEADCHNL RA PACING THRESHOLD AMPLITUDE: 0.625 V
MDC IDC MSMT LEADCHNL RV PACING THRESHOLD AMPLITUDE: 0.875 V
MDC IDC MSMT LEADCHNL RV PACING THRESHOLD PULSEWIDTH: 0.4 ms
MDC IDC PG IMPLANT DT: 20140321
MDC IDC SET LEADCHNL RA PACING AMPLITUDE: 2 V
MDC IDC SET LEADCHNL RV PACING PULSEWIDTH: 0.4 ms
MDC IDC STAT BRADY AP VP PERCENT: 0 %

## 2016-07-05 ENCOUNTER — Other Ambulatory Visit: Payer: Self-pay | Admitting: Internal Medicine

## 2016-08-02 ENCOUNTER — Ambulatory Visit (INDEPENDENT_AMBULATORY_CARE_PROVIDER_SITE_OTHER): Payer: Medicare Other | Admitting: *Deleted

## 2016-08-02 ENCOUNTER — Telehealth: Payer: Self-pay | Admitting: *Deleted

## 2016-08-02 VITALS — BP 132/62 | HR 64 | Resp 20 | Ht 71.0 in | Wt 169.0 lb

## 2016-08-02 DIAGNOSIS — Z0000 Encounter for general adult medical examination without abnormal findings: Secondary | ICD-10-CM | POA: Diagnosis not present

## 2016-08-02 NOTE — Progress Notes (Signed)
Pre visit review using our clinic review tool, if applicable. No additional management support is needed unless otherwise documented below in the visit note. 

## 2016-08-02 NOTE — Telephone Encounter (Signed)
During AWV patient and his wife Christin, Mccreedy - [phone],  who is also seen by Dr. Quay Burow requested to have Shingrix vaccination sent to their pharmacy [facility] Address: [address], [place], [place] [address].

## 2016-08-02 NOTE — Patient Instructions (Signed)
Continue doing brain stimulating activities (puzzles, reading, adult coloring books, staying active) to keep memory sharp.   Continue to eat heart healthy diet (full of fruits, vegetables, whole grains, lean protein, water--limit salt, fat, and sugar intake) and increase physical activity as tolerated.   [name] , Thank you for taking time to come for your Medicare Wellness Visit. I appreciate your ongoing commitment to your health goals. Please review the following plan we discussed and let me know if I can assist you in the future.   These are the goals we discussed: Goals    . maintian current health status          Enjoy life and be as active and as independent as possible       This is a list of the screening recommended for you and due dates:  Health Maintenance  Topic Date Due  . Flu Shot  08/08/2016  . Tetanus Vaccine  04/12/2026  . Pneumonia vaccines  Completed

## 2016-08-02 NOTE — Progress Notes (Addendum)
Subjective:   [name] is a 83 y.o. male who presents for Medicare Annual/Subsequent preventive examination.  Review of Systems:  No ROS.  Medicare Wellness Visit. Additional risk factors are reflected in the social history.  Cardiac Risk Factors include: advanced age (>68mn, >>67women);hypertension;male gender Sleep patterns: no sleep issues, feels rested on waking, does not get up to void, gets up 1-2 times nightly to void and sleeps 5-6 hours nightly. Patient reports insomnia issues, discussed recommended sleep tips and stress reduction tips.  Home Safety/Smoke Alarms: Feels safe in home. Smoke alarms in place.  Living environment; residence and Firearm Safety: 1-story house/ trailer, no firearms. Lives with wife, no needs for DME, good support system  Seat Belt Safety/Bike Helmet: Wears seat belt.   Counseling:   Eye Exam- appointment yearly  Dental- appointment every 6 months  Male:   CCS-  N/A  PSA-  Lab Results  Component Value Date   PSA 0.44 07/18/2007   PSA 0.38 01/17/2007   PSA 0.39 07/17/2006       Objective:    Vitals: BP 132/62   Pulse 64   Resp 20   Ht _0  (1.803 m)   Wt 169 lb (76.7 kg)   SpO2 99%   BMI 23.57 kg/m   Body mass index is 23.57 kg/m.  Tobacco History  Smoking Status  . Never Smoker  Smokeless Tobacco  . Never Used     Counseling given: Not Answered   Past Medical History:  Diagnosis Date  . Acute superficial venous thrombosis of lower extremity    a. RLE after CABG, neg dopp for DVT.  .Marland KitchenAllergy   . Atrial fibrillation ([facility]    a. Post-op from CABG, on amiodarone temporarily, d/c'd 12/2011  . CAD (coronary artery disease)    a. S/P stenting to mid RCA, prox PDA 06/1999. b. NSTEMI/CABG x 3 in 10/2011 with LIMA to LAD, SVG to PDA, and SVG to OM1.   . Chronic UTI    a. Followed by Dr. GRisa Grill- colonized/asymptomatic - not on abx  . CKD (chronic kidney disease), stage III    a. stable with a creatinine around  1.9-2.0 followed by nephrology.  . Dyslipidemia   . GERD (gastroesophageal reflux disease)   . Hypertension    well-controlled.  . Myocardial infarction ([facility]   . Neck injury    a. C3-C4 and C4-C5 foraminal narrowing, severe  . Prostate cancer ([facility]    a. 2001 s/p TURP.  . Pulmonary nodule    a. felt to be noncancerous.  Status post followup CT scan 4 mm and stable.  . Renal artery stenosis ([facility]    a. 50% by cath 2001  . 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 cardia catherization  07-07-99  . CARDIAC SURGERY  10/18/12   open heart surgery  . CATARACT EXTRACTION W/ INTRAOCULAR LENS  IMPLANT, BILATERAL  3/205, 06/2013   [facility]  . COLONOSCOPY  04/12/07  . CORONARY ARTERY BYPASS GRAFT  10/19/2011   Procedure: CORONARY ARTERY BYPASS GRAFTING (CABG);  Surgeon: BGaye Pollack MD;  [facility]  Service: Open Heart Surgery;  Laterality: N/A;  times three using Left Internal Mammary Artery and Right Greater Saphenouse Vein Graft harvested Endoscopically  . edg  07-17-1994  . FLEXIBLE SIGMOIDOSCOPY  11-03-1997  . LEFT HEART CATHETERIZATION WITH CORONARY ANGIOGRAM N/A 10/16/2011  Procedure: LEFT HEART CATHETERIZATION WITH CORONARY ANGIOGRAM;  Surgeon: Peter M Martinique, MD;  Location: [facility] CATH LAB;  Service: Cardiovascular;  Laterality: N/A;  . LEFT HEART CATHETERIZATION WITH CORONARY/GRAFT ANGIOGRAM N/A 03/11/2013   Procedure: LEFT HEART CATHETERIZATION WITH [name];  Surgeon: Blane Ohara, MD;  Location: [facility] CATH LAB;  Service: Cardiovascular;  Laterality: N/A;  . lumbar spinal disk and neck fusion surgery    . PACEMAKER INSERTION  03/28/12   PPM implanted for mobitz II AV block  . peripheral vascular catherization  11-24-03  . PERMANENT PACEMAKER INSERTION N/A 03/28/2012   Procedure: PERMANENT PACEMAKER INSERTION;  Surgeon: Thompson Grayer, MD;  Location: [facility] CATH LAB;  Service:  Cardiovascular;  Laterality: N/A;  . PROSTATECTOMY    . renal circulation  10-01-03  . s/p ptca    . stents     X 2  . stress cardiolite  05-04-05   spring 09-negative except for apical thinning, EF 68%   Family History  Problem Relation Age of Onset  . Coronary artery disease Father        died @ 43  . Other Mother        cerebral hemorrhage - died @ 63  . Arthritis Mother   . Cancer Brother        Bladder  . Prostate cancer Brother   . Nephritis Brother        died @ age 2.  . Other Brother        cerebral hemorrhage - died @ 71  . Heart disease Brother   . Breast cancer Other        niece x 2  . Diabetes Neg Hx   . Colon cancer Neg Hx    History  Sexual Activity  . Sexual activity: Not on file    Outpatient Encounter Prescriptions as of 08/02/2016  Medication Sig  . acetaminophen (TYLENOL) 500 MG tablet Take 500 mg by mouth every 6 (six) hours as needed.  [name] amLODipine (NORVASC) 5 MG tablet Take 1 tablet (5 mg total) by mouth daily.  [name] aspirin EC 81 MG tablet Take 1 tablet (81 mg total) by mouth daily.  . carboxymethylcellulose (REFRESH PLUS) 0.5 % SOLN Place 1 drop into both eyes daily as needed (dry eyes).  . DiphenhydrAMINE HCl, Sleep, (ZZZQUIL PO) Take 1 Dose by mouth at bedtime as needed (sleep).  . ergocalciferol (VITAMIN D2) 50000 UNITS capsule Take 50,000 Units by mouth every 30 (thirty) days. 15 th of each month  . folic acid (FOLVITE) 1 MG tablet Take 1 tablet (1 mg total) by mouth daily.  . hydrocortisone (ANUSOL-HC) 25 MG suppository Place 1 suppository (25 mg total) rectally 2 (two) times daily.  . hydrocortisone 2.5 % ointment APPLY TO AFFECTED AREA 1 TO 2 TIMES DAILY FOR 14 DAYS  . metoprolol succinate (TOPROL XL) 25 MG 24 hr tablet Take 1 tablet (25 mg total) by mouth 2 (two) times daily. Annual appt due in august must see MD for refills  . Multiple Vitamins-Minerals (PRESERVISION AREDS PO) Take 1 tablet by mouth daily.   . nitroGLYCERIN (NITROSTAT) 0.4  MG SL tablet DISSOLVE 1 TABLET UNDER THE TONGUE EVERY 5 MINUTES AS NEEDED FOR CHEST PAIN (MAX OF 3 TABLETS)  . simvastatin (ZOCOR) 20 MG tablet Take 1 tablet (20 mg total) by mouth daily. Annual appt due in august must see MD for refills  . vitamin B-12 (CYANOCOBALAMIN) 500 MCG tablet Take 500 mcg by mouth daily. Take one tablet  every 3rd day  . vitamin C (ASCORBIC ACID) 500 MG tablet Take 1,500 mg by mouth daily.  . Wheat Dextrin (BENEFIBER PO) Take 1 tablet by mouth 3 (three) times daily.  . Zinc Oxide (DESITIN MAXIMUM STRENGTH EX) Apply 1 Dose topically as directed.   No facility-administered encounter medications on file as of 08/02/2016.     Activities of Daily Living In your present state of health, do you have any difficulty performing the following activities: 08/02/2016  Hearing? N  Vision? N  Difficulty concentrating or making decisions? N  Walking or climbing stairs? N  Dressing or bathing? N  Doing errands, shopping? N  Preparing Food and eating ? N  Using the Toilet? N  In the past six months, have you accidently leaked urine? N  Do you have problems with loss of bowel control? N  Managing your Medications? N  Managing your Finances? N  Housekeeping or managing your Housekeeping? N  Some recent data might be hidden    Patient Care Team: [name], MD as PCP - General (Internal Medicine) Rana Snare, MD (Urology) Edrick Oh, MD (Nephrology) Allyn Kenner, MD (Dermatology) Luberta Mutter, MD (Ophthalmology) Latanya Maudlin, MD (Orthopedic Surgery) [name], MD (Cardiology) Inda Castle, MD (Gastroenterology)   Assessment:    Physical assessment deferred to PCP.  Exercise Activities and Dietary recommendations Current Exercise Habits: Structured exercise class, Type of exercise: calisthenics;walking, Time (Minutes): 45, Frequency (Times/Week): 3, Weekly Exercise (Minutes/Week): 135, Intensity: Moderate, Exercise limited by: orthopedic  condition(s)  Diet (meal preparation, eat out, water intake, caffeinated beverages, dairy products, fruits and vegetables): in general, a "healthy" diet  , well balanced, eats a variety of fruits and vegetables daily, limits salt, fat/cholesterol, sugar, caffeine, drinks 6-8 glasses of water daily.  Goals    . maintian current health status          Enjoy life and be as active and as independent as possible      Fall Risk Fall Risk  08/02/2016 11/16/2015 09/01/2015 08/31/2014 08/26/2013  Falls in the past year? _0   Risk for fall due to : Impaired balance/gait - - - Impaired balance/gait   Depression Screen PHQ 2/9 Scores 08/02/2016 09/01/2015 08/31/2014 08/26/2013  PHQ - 2 Score 0 0 0 0    Cognitive Function MMSE - Mini Mental State Exam 08/02/2016  Orientation to time 5  Orientation to Place 5  Registration 3  Attention/ Calculation 5  Recall 2  Language- name 2 objects 2  Language- repeat 1  Language- follow 3 step command 3  Language- read & follow direction 1  Write a sentence 1  Copy design 1  Total score 29        Immunization History  Administered Date(s) Administered  . H1N1 04/20/2008  . Influenza Split 09/13/2011  . Influenza Whole 10/12/2007, 11/18/2008, 09/15/2009, 09/08/2010  . Influenza, High Dose Seasonal PF 09/08/2015  . Influenza,inj,Quad PF,36+ Mos 09/16/2012, 08/26/2013, 08/31/2014  . MMR 04/21/2010  . Pneumococcal Conjugate-13 02/04/2013  . Pneumococcal Polysaccharide-23 02/21/2006, 08/31/2014  . Td 02/21/2006  . Tdap 04/11/2016  . Zoster 08/21/2005   Screening Tests Health Maintenance  Topic Date Due  . INFLUENZA VACCINE  08/08/2016  . TETANUS/TDAP  04/12/2026  . PNA vac Low Risk Adult  Completed      Plan:    Continue doing brain stimulating activities (puzzles, reading, adult coloring books, staying active) to keep memory sharp.   Continue to eat heart healthy diet (full  of fruits, vegetables, whole grains, lean protein,  water--limit salt, fat, and sugar intake) and increase physical activity as tolerated.  I have personally reviewed and noted the following in the patient's chart:   . Medical and social history . Use of alcohol, tobacco or illicit drugs  . Current medications and supplements . Functional ability and status . Nutritional status . Physical activity . Advanced directives . List of other physicians . Vitals . Screenings to include cognitive, depression, and falls . Referrals and appointments  In addition, I have reviewed and discussed with patient certain preventive protocols, quality metrics, and best practice recommendations. A written personalized care plan for preventive services as well as general preventive health recommendations were provided to patient.     Michiel Cowboy, RN  08/02/2016   Medical screening examination/treatment/procedure(s) were performed by non-physician practitioner and as supervising physician I was immediately available for consultation/collaboration. I agree with above. Scarlette Calico, MD

## 2016-08-03 MED ORDER — ZOSTER VAC RECOMB ADJUVANTED 50 MCG/0.5ML IM SUSR: 0.5000 mL | Freq: Once | INTRAMUSCULAR | 1 refills | Status: AC

## 2016-08-03 MED ORDER — ZOSTER VAC RECOMB ADJUVANTED 50 MCG/0.5ML IM SUSR: 0.5000 mL | Freq: Once | INTRAMUSCULAR | 1 refills | Status: DC

## 2016-08-03 NOTE — Telephone Encounter (Signed)
Sent for both

## 2016-08-07 ENCOUNTER — Ambulatory Visit: Payer: Medicare Other | Admitting: Gastroenterology

## 2016-08-13 ENCOUNTER — Telehealth: Payer: Self-pay | Admitting: Cardiovascular Disease

## 2016-08-13 MED ORDER — AMLODIPINE BESYLATE 5 MG PO TABS: 2.5000 mg | ORAL_TABLET | Freq: Every day | ORAL | 3 refills | Status: DC

## 2016-08-13 NOTE — Telephone Encounter (Signed)
Agree with plan as outlined.

## 2016-08-13 NOTE — Telephone Encounter (Signed)
I spoke with the pt and for the past few weeks the pt has felt really tired.  Yesterday while at church the pt felt really wiped out and had to leave before church was over.  When the pt got home he checked his BP 5 times and every time his SBP was less than 99 and DBP was less than 58.  The pt pushed his fluid intake and today BP is 130/69 without taking amlodipine.  The pt's pulse is running in the 60s. I advised the pt that he can cut his Amlodipine down to 2.5mg  daily and if BP remains low then stop Amlodipine and contact the office. Pt agreed with plan.

## 2016-08-13 NOTE — Telephone Encounter (Signed)
New Message  Pt c/o medication issue:  1. Name of Medication: Amlodipine   2. How are you currently taking this medication (dosage and times per day)? 5mg   3. Are you having a reaction (difficulty breathing--STAT)? Pt states he has questions for the RN  4. What is your medication issue? Pt would like to speak with RN about this medication. Please call back to discuss

## 2016-08-15 DIAGNOSIS — K648 Other hemorrhoids: Secondary | ICD-10-CM

## 2016-08-15 HISTORY — DX: Other hemorrhoids: K64.8

## 2016-09-03 NOTE — Progress Notes (Signed)
Subjective:    Patient ID: [name], male    DOB: Nov 23, 1926, 83 y.o.   MRN: [id number]  HPI The patient is here for follow up.  CAD, Hypertension: He is taking his medication daily. He is compliant with a low sodium diet.  He denies chest pain, palpitations, edema. He is exercising regularly.  He does monitor his blood pressure at home.  His BP is variable.  He feels tired when the BP is low.  BP varies 107/50-143/64.  Hs HR is in the 60's.  He does feel lightheaded at times when his BP is on the low side.   CKD:  He does not take any advil or aleve.  He drinks a good amount of fluids during the day.  He follows with Dr Justin Mend at [facility].  Hyperlipidemia: He is taking his medication daily. He is compliant with a low fat/cholesterol diet. He is exercising regularly. He denies myalgias.   When he yawns the "esophagus sometimes hurts".  This is not new.  Sometimes it hurts a lot.  Dr Loletha Carrow thought this was normal for his age. .    B12 deficiency;  He takes one pill every third day.    Medications and allergies reviewed with patient and updated if appropriate.  Patient Active Problem List   Diagnosis Date Noted  . Incontinence of feces 06/26/2016  . Loose stools 02/06/2016  . Dysphagia 02/06/2016  . Cephalalgia 07/14/2015  . Constipation 03/01/2015  . Claudication of left lower extremity ([place]) 03/02/2014  . 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 Symptomatic bradycardia 03/26/2012  . Pulmonary nodule   . CAD - CABG Oct 2013   . Chronic renal impairment, stage 3 (moderate)   . Venous insufficiency of leg 06/05/2010  . PERSISTENT DISORDER INITIATING/MAINTAINING SLEEP 11/09/2009  . SHOULDER PAIN, RIGHT, CHRONIC 10/14/2009  . B12 nutritional deficiency 08/23/2009  . Peripheral neuropathy 03/21/2009  . DEGENERATIVE DISC DISEASE, CERVICAL SPINE, W/RADICULOPATHY 09/21/2008  . Dyslipidemia 01/17/2007  . Essential hypertension 07/20/2006  . PROSTATE CANCER,  HX OF- surgery 2001 07/20/2006    Current Outpatient Prescriptions on File Prior to Visit  Medication Sig Dispense Refill  . acetaminophen (TYLENOL) 500 MG tablet Take 500 mg by mouth every 6 (six) hours as needed.    [name] amLODipine (NORVASC) 5 MG tablet Take 0.5 tablets (2.5 mg total) by mouth daily. 90 tablet 3  . aspirin EC 81 MG tablet Take 1 tablet (81 mg total) by mouth daily.    . carboxymethylcellulose (REFRESH PLUS) 0.5 % SOLN Place 1 drop into both eyes daily as needed (dry eyes). 30 mL 1  . DiphenhydrAMINE HCl, Sleep, (ZZZQUIL PO) Take 1 Dose by mouth at bedtime as needed (sleep).    . ergocalciferol (VITAMIN D2) 50000 UNITS capsule Take 50,000 Units by mouth every 30 (thirty) days. 15 th of each month    . folic acid (FOLVITE) 1 MG tablet Take 1 tablet (1 mg total) by mouth daily. 90 tablet 3  . metoprolol succinate (TOPROL XL) 25 MG 24 hr tablet Take 1 tablet (25 mg total) by mouth 2 (two) times daily. Annual appt due in august must see MD for refills 180 tablet 0  . Multiple Vitamins-Minerals (PRESERVISION AREDS PO) Take 1 tablet by mouth daily.     . nitroGLYCERIN (NITROSTAT) 0.4 MG SL tablet DISSOLVE 1 TABLET UNDER THE TONGUE EVERY 5 MINUTES AS NEEDED FOR CHEST PAIN (MAX OF 3 TABLETS) 100 tablet 0  .  simvastatin (ZOCOR) 20 MG tablet Take 1 tablet (20 mg total) by mouth daily. Annual appt due in august must see MD for refills 90 tablet 0  . vitamin B-12 (CYANOCOBALAMIN) 500 MCG tablet Take 500 mcg by mouth daily. Take one tablet every 3rd day    . vitamin C (ASCORBIC ACID) 500 MG tablet Take 1,500 mg by mouth daily.     No current facility-administered medications on file prior to visit.     Past Medical History:  Diagnosis Date  . Acute superficial venous thrombosis of lower extremity    a. RLE after CABG, neg dopp for DVT.  [name] Allergy   . Atrial fibrillation ([place])    a. Post-op from CABG, on amiodarone temporarily, d/c'd 12/2011  . CAD (coronary artery disease)    a. S/P  stenting to mid RCA, prox PDA 06/1999. b. NSTEMI/CABG x 3 in 10/2011 with LIMA to LAD, SVG to PDA, and SVG to OM1.   . Chronic UTI    a. Followed by Dr. Risa Grill - colonized/asymptomatic - not on abx  . CKD (chronic kidney disease), stage III    a. stable with a creatinine around 1.9-2.0 followed by nephrology.  . Dyslipidemia   . GERD (gastroesophageal reflux disease)   . Hypertension    well-controlled.  . Myocardial infarction ([place])   . Neck injury    a. C3-C4 and C4-C5 foraminal narrowing, severe  . Prostate cancer ([place])    a. 2001 s/p TURP.  . Pulmonary nodule    a. felt to be noncancerous.  Status post followup CT scan 4 mm and stable.  . Renal artery stenosis ([place])    a. 50% by cath 2001  . 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 cardia catherization  07-07-99  . CARDIAC SURGERY  10/18/12   open heart surgery  . CATARACT EXTRACTION W/ INTRAOCULAR LENS  IMPLANT, BILATERAL  3/205, 06/2013   [facility]  . COLONOSCOPY  04/12/07  . CORONARY ARTERY BYPASS GRAFT  10/19/2011   Procedure: CORONARY ARTERY BYPASS GRAFTING (CABG);  Surgeon: Gaye Pollack, MD;  Location: [place];  Service: Open Heart Surgery;  Laterality: N/A;  times three using Left Internal Mammary Artery and Right Greater Saphenouse Vein Graft harvested Endoscopically  . edg  07-17-1994  . FLEXIBLE SIGMOIDOSCOPY  11-03-1997  . LEFT HEART CATHETERIZATION WITH CORONARY ANGIOGRAM N/A 10/16/2011   Procedure: LEFT HEART CATHETERIZATION WITH CORONARY ANGIOGRAM;  Surgeon: Peter M Martinique, MD;  Location: [facility] CATH LAB;  Service: Cardiovascular;  Laterality: N/A;  . LEFT HEART CATHETERIZATION WITH CORONARY/GRAFT ANGIOGRAM N/A 03/11/2013   Procedure: LEFT HEART CATHETERIZATION WITH [name];  Surgeon: Blane Ohara, MD;  Location: [facility] CATH LAB;  Service: Cardiovascular;  Laterality: N/A;  . lumbar spinal disk and neck fusion surgery     . PACEMAKER INSERTION  03/28/12   PPM implanted for mobitz II AV block  . peripheral vascular catherization  11-24-03  . PERMANENT PACEMAKER INSERTION N/A 03/28/2012   Procedure: PERMANENT PACEMAKER INSERTION;  Surgeon: Thompson Grayer, MD;  Location: [facility] C[facility] LAB;  Service: Cardiovascular;  Laterality: N/A;  . PROSTATECTOMY    . renal circulation  10-01-03  . s/p ptca    . stents     X 2  . stress cardiolite  05-04-05   spring 09-negative except for apical thinning, EF 68%    Social History   Social History  .  Marital status: Married    Spouse name: [name]  . Number of children: 2  . Years of education: N/A   Occupational History  . 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  Retired   Social History Main Topics  . Smoking status: Never Smoker  . Smokeless tobacco: Never Used  . Alcohol use Yes     Comment: Rarely  . Drug use: No  . Sexual activity: Not Asked   Other Topics Concern  . None   Social History Narrative   HSG, 1 year college.  married '52 - 3 years, divorced; married '56 - 3 years divorced; married '26-12 yrs - divorced; married '75 -. 1 son '57; 1 daughter - '53; 1 grandchild.  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  Very happily married.  End of life care: yes CPR, no long term mechanical ventilation, no heroic measures.    Family History  Problem Relation Age of Onset  . Coronary artery disease Father        died @ 33  . Other Mother        cerebral hemorrhage - died @ 74  . Arthritis Mother   . Cancer Brother        Bladder  . Prostate cancer Brother   . Nephritis Brother        died @ age 55.  . Other Brother        cerebral hemorrhage - died @ 81  . Heart disease Brother   . Breast cancer Other        niece x 2  . Diabetes Neg Hx   . Colon cancer Neg Hx     Review of Systems  Constitutional: Positive for fatigue  (when BP is low). Negative for chills and fever.  HENT: Negative for congestion.   Respiratory: Positive for cough and shortness of breath. Negative for wheezing.   Cardiovascular: Negative for chest pain, palpitations and leg swelling.  Gastrointestinal: Negative for abdominal pain and nausea.       No gerd  Neurological: Positive for dizziness (when BP is low), light-headedness (when BP is low) and headaches.       Objective:   Vitals:   09/04/16 1036  BP: (!) 146/74  Pulse: 65  Resp: 16  Temp: 98.3 F (36.8 C)  SpO2: 98%   Wt Readings from Last 3 Encounters:  09/04/16 168 lb (76.2 kg)  08/02/16 169 lb (76.7 kg)  06/26/16 172 lb (78 kg)   Body mass index is 23.43 kg/m.   Physical Exam    Constitutional: Appears well-developed and well-nourished. No distress.  HENT:  Head: Normocephalic and atraumatic.  Neck: Neck supple. No tracheal deviation present. No thyromegaly present.  No cervical lymphadenopathy Cardiovascular: Normal rate, regular rhythm and normal heart sounds.   No murmur heard. No carotid bruit .  No edema Pulmonary/Chest: Effort normal and breath sounds normal. No respiratory distress. No has no wheezes. No rales.  Abdomen:  Soft, non tender, non distended Skin: Skin is warm and dry. Not diaphoretic.  Psychiatric: Normal mood and affect. Behavior is normal.      Assessment & Plan:    See Problem List for Assessment and Plan of chronic medical problems.

## 2016-09-04 ENCOUNTER — Ambulatory Visit (INDEPENDENT_AMBULATORY_CARE_PROVIDER_SITE_OTHER): Payer: Medicare Other | Admitting: Internal Medicine

## 2016-09-04 ENCOUNTER — Encounter: Payer: Self-pay | Admitting: Internal Medicine

## 2016-09-04 ENCOUNTER — Other Ambulatory Visit (INDEPENDENT_AMBULATORY_CARE_PROVIDER_SITE_OTHER): Payer: Medicare Other

## 2016-09-04 VITALS — BP 146/74 | HR 65 | Temp 98.3°F | Resp 16 | Ht 71.0 in | Wt 168.0 lb

## 2016-09-04 DIAGNOSIS — I2510 Atherosclerotic heart disease of native coronary artery without angina pectoris: Secondary | ICD-10-CM

## 2016-09-04 DIAGNOSIS — N183 Chronic kidney disease, stage 3 unspecified: Secondary | ICD-10-CM

## 2016-09-04 DIAGNOSIS — E538 Deficiency of other specified B group vitamins: Secondary | ICD-10-CM

## 2016-09-04 DIAGNOSIS — I10 Essential (primary) hypertension: Secondary | ICD-10-CM

## 2016-09-04 DIAGNOSIS — E785 Hyperlipidemia, unspecified: Secondary | ICD-10-CM

## 2016-09-04 DIAGNOSIS — I25810 Atherosclerosis of coronary artery bypass graft(s) without angina pectoris: Secondary | ICD-10-CM

## 2016-09-04 LAB — COMPREHENSIVE METABOLIC PANEL
ALBUMIN: 4.7 g/dL (ref 3.5–5.2)
ALT: 11 U/L (ref 0–53)
AST: 14 U/L (ref 0–37)
Alkaline Phosphatase: 54 U/L (ref 39–117)
BILIRUBIN TOTAL: 0.8 mg/dL (ref 0.2–1.2)
BUN: 38 mg/dL — ABNORMAL HIGH (ref 6–23)
CALCIUM: 10 mg/dL (ref 8.4–10.5)
CO2: 27 meq/L (ref 19–32)
Chloride: 105 mEq/L (ref 96–112)
Creatinine, Ser: 2.15 mg/dL — ABNORMAL HIGH (ref 0.40–1.50)
GFR: 30.98 mL/min — AB (ref >60.00)
Glucose, Bld: 97 mg/dL (ref 70–99)
Potassium: 4.8 mEq/L (ref 3.5–5.1)
Sodium: 140 mEq/L (ref 135–145)
Total Protein: 7.7 g/dL (ref 6.0–8.3)

## 2016-09-04 LAB — CBC WITH DIFFERENTIAL/PLATELET
BASOS ABS: 0 10*3/uL (ref 0.0–0.1)
Basophils Relative: 0.5 % (ref 0.0–3.0)
EOS ABS: 0.1 10*3/uL (ref 0.0–0.7)
Eosinophils Relative: 0.8 % (ref 0.0–5.0)
HEMATOCRIT: 42.8 % (ref 39.0–52.0)
HEMOGLOBIN: 13.9 g/dL (ref 13.0–17.0)
LYMPHS PCT: 16.6 % (ref 12.0–46.0)
Lymphs Abs: 1.4 10*3/uL (ref 0.7–4.0)
MCHC: 32.4 g/dL (ref 30.0–36.0)
MCV: 79.5 fl (ref 78.0–100.0)
MONOS PCT: 8 % (ref 3.0–12.0)
Monocytes Absolute: 0.7 10*3/uL (ref 0.1–1.0)
NEUTROS ABS: 6.2 10*3/uL (ref 1.4–7.7)
Neutrophils Relative %: 74.1 % (ref 43.0–77.0)
Platelets: 250 10*3/uL (ref 150.0–400.0)
RBC: 5.39 Mil/uL (ref 4.22–5.81)
RDW: 15.9 % — ABNORMAL HIGH (ref 11.5–15.5)
WBC: 8.4 10*3/uL (ref 4.0–10.5)

## 2016-09-04 LAB — VITAMIN B12: Vitamin B-12: 446 pg/mL (ref 211–911)

## 2016-09-04 LAB — TSH: TSH: 2.58 u[IU]/mL (ref 0.35–4.50)

## 2016-09-04 LAB — LIPID PANEL
Cholesterol: 122 mg/dL (ref 0–200)
HDL: 40.4 mg/dL (ref >39.00)
LDL CALC: 70 mg/dL (ref 0–99)
NonHDL: 81.6
TRIGLYCERIDES: 59 mg/dL (ref 0.0–149.0)
Total CHOL/HDL Ratio: 3
VLDL: 11.8 mg/dL (ref 0.0–40.0)

## 2016-09-04 NOTE — Patient Instructions (Addendum)
  Test(s) ordered today. Your results will be released to Roy (or called to you) after review, usually within 72hours after test completion. If any changes need to be made, you will be notified at that same time.  All other Health Maintenance issues reviewed.   All recommended immunizations and age-appropriate screenings are up-to-date or discussed.  No immunizations administered today.   Medications reviewed and updated.  Changes include stopping the amlodipine.     Please followup in one year

## 2016-09-04 NOTE — Assessment & Plan Note (Addendum)
Blood pressure well-controlled at home, but at times blood pressure is and he is symptomatic with fatigue and lightheadedness Will discontinue amlodipine Continue metoprolol at current dose He will continue to monitor his blood pressure at home CMP, TSH, CBC

## 2016-09-04 NOTE — Assessment & Plan Note (Signed)
Check lipid panel  Continue daily statin Regular exercise and healthy diet encouraged  

## 2016-09-04 NOTE — Assessment & Plan Note (Signed)
Taking B12 500 g every third day Check B12 level

## 2016-09-04 NOTE — Assessment & Plan Note (Signed)
Following with Dr Burt Knack No chest pain Continue ASA, statin and metoprolol

## 2016-09-04 NOTE — Assessment & Plan Note (Signed)
Follows with Dr [employer] at [facility] today

## 2016-09-06 ENCOUNTER — Telehealth: Payer: Self-pay | Admitting: Cardiovascular Disease

## 2016-09-06 ENCOUNTER — Encounter: Payer: Self-pay | Admitting: Internal Medicine

## 2016-09-06 NOTE — Telephone Encounter (Signed)
Reviewed. Agree with medication changes.

## 2016-09-06 NOTE — Telephone Encounter (Signed)
New message   Pt states he was with Dr. Quay Burow on the 28th about taking amlodipine. He was taking half a dose and he was still feeling weak. She suggested to stop it for 2 to 3 weeks and see what happens. If you have any questions, you can call him at the number provided.

## 2016-09-06 NOTE — Telephone Encounter (Signed)
The patient states that he has called in the past (8/6) regarding being very fatigued and having fluctuations in his BP and was advised to cut back his amlodipine. Patient states that he was seen by Dr. Quay Burow on 8/28 and was advised to stop his amlodipine due to having drops in his BP that is accompanied with lightheadedness and fatigue. Patient's BP ranges from low 100-140s/50-60s with HR in the 60s. Patient was calling to let Dr. Burt Knack know that Dr. Quay Burow advised him to stop his amlodipine for 3 weeks. Made the patient aware that the information would be forwarded and that he should continue to monitor BP. Patient verbalized understanding and thanked me for the call.

## 2016-09-14 ENCOUNTER — Telehealth: Payer: Self-pay

## 2016-09-14 NOTE — Telephone Encounter (Signed)
   [facility] Pre-operative Risk Assessment    Request for surgical clearance:  1) What type of surgery is being performed? Extraction of tooth #2 and placement of a bone graft with local anesthetic. Placement of a dental implant in the #2 area after the 3 month healing period and osteointegration of the graft material has been confirmed   2) When is this surgery scheduled? September 18, 2016  3) Are there any medications that need to be held prior to surgery and how long? None requested  4) Name of physician performing surgery? Debbora Presto, DDS ([facility])   5) What is your office phone and fax number?  Phone: [phone]   Fax: [phone]  6) Dentist would like to know MD response as outlined below:   1) OK to proceed with dental treatment; NO special precautions and NO prophylactic abx    2) Antibiotic prophylaxis IS required for dental treatment according to the current American Heart Association and/or American Academy of Orthopedic Surgeons guidelines    3) DO NOT proceed with treatment (and give reason)    4) Treatment may proceed    5) Patient has infectious disease  (AIDs, Hepatitis, TB)     ______________________________________________________

## 2016-09-17 NOTE — Telephone Encounter (Signed)
Faxed to [facility] at fax# [phone]

## 2016-09-17 NOTE — Telephone Encounter (Signed)
Reviewed last office note and echo result. Pt ok to proceed at low cardiac risk. thanks

## 2016-10-01 ENCOUNTER — Ambulatory Visit (INDEPENDENT_AMBULATORY_CARE_PROVIDER_SITE_OTHER): Payer: Medicare Other | Admitting: *Deleted

## 2016-10-01 DIAGNOSIS — I441 Atrioventricular block, second degree: Secondary | ICD-10-CM | POA: Diagnosis not present

## 2016-10-02 LAB — CUP PACEART REMOTE DEVICE CHECK
Battery Voltage: 2.79 V
Brady Statistic AP VS Percent: 34 %
Brady Statistic AS VS Percent: 65 %
Date Time Interrogation Session: 20180924140804
Implantable Lead Implant Date: 20140321
Implantable Lead Location: 753860
Lead Channel Pacing Threshold Pulse Width: 0.4 ms
Lead Channel Pacing Threshold Pulse Width: 0.4 ms
Lead Channel Setting Pacing Pulse Width: 0.4 ms
MDC IDC LEAD IMPLANT DT: 20140321
MDC IDC LEAD LOCATION: 753859
MDC IDC MSMT BATTERY IMPEDANCE: 257 Ohm
MDC IDC MSMT BATTERY REMAINING LONGEVITY: 122 mo
MDC IDC MSMT LEADCHNL RA IMPEDANCE VALUE: 452 Ohm
MDC IDC MSMT LEADCHNL RA PACING THRESHOLD AMPLITUDE: 0.625 V
MDC IDC MSMT LEADCHNL RV IMPEDANCE VALUE: 619 Ohm
MDC IDC MSMT LEADCHNL RV PACING THRESHOLD AMPLITUDE: 0.875 V
MDC IDC PG IMPLANT DT: 20140321
MDC IDC SET LEADCHNL RA PACING AMPLITUDE: 2 V
MDC IDC SET LEADCHNL RV PACING AMPLITUDE: 2.5 V
MDC IDC SET LEADCHNL RV SENSING SENSITIVITY: 5.6 mV
MDC IDC STAT BRADY AP VP PERCENT: 0 %
MDC IDC STAT BRADY AS VP PERCENT: 0 %

## 2016-10-02 NOTE — Progress Notes (Signed)
Remote pacemaker transmission.   

## 2016-10-04 ENCOUNTER — Encounter: Payer: Self-pay | Admitting: Cardiology

## 2016-10-10 ENCOUNTER — Ambulatory Visit (INDEPENDENT_AMBULATORY_CARE_PROVIDER_SITE_OTHER): Payer: Medicare Other

## 2016-10-10 DIAGNOSIS — Z23 Encounter for immunization: Secondary | ICD-10-CM | POA: Diagnosis not present

## 2016-10-11 NOTE — Progress Notes (Signed)
Subjective:    Patient ID: [name], male    DOB: [birth year], 83 y.o.   MRN: [id number]  HPI He is here for an acute visit for cold symptoms.  His symptoms started two weeks ago and have not improved.   He is experiencing sore throat, pain with swallowing, he had difficulty swallowing last week which is not new, PND, he is clearing his throat a lot, fatigue, cough and headaches.  He has tried taking throat lozenges.    EGD 3/18, which was normal.  His difficulty swallowing is thought to be related to cervical spine disease.    Medications and allergies reviewed with patient and updated if appropriate.  Patient Active Problem List   Diagnosis Date Noted  . Incontinence of feces 06/26/2016  . Loose stools 02/06/2016  . Dysphagia 02/06/2016  . Cephalalgia 07/14/2015  . Constipation 03/01/2015  . Claudication of left lower extremity ([place]) 03/02/2014  . 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 Symptomatic bradycardia 03/26/2012  . Pulmonary nodule   . CAD - CABG Oct 2013   . Chronic renal impairment, stage 3 (moderate) (HCC)   . Venous insufficiency of leg 06/05/2010  . PERSISTENT DISORDER INITIATING/MAINTAINING SLEEP 11/09/2009  . SHOULDER PAIN, RIGHT, CHRONIC 10/14/2009  . B12 nutritional deficiency 08/23/2009  . Peripheral neuropathy 03/21/2009  . DEGENERATIVE DISC DISEASE, CERVICAL SPINE, W/RADICULOPATHY 09/21/2008  . Dyslipidemia 01/17/2007  . Essential hypertension 07/20/2006  . PROSTATE CANCER, HX OF- surgery 2001 07/20/2006    Current Outpatient Prescriptions on File Prior to Visit  Medication Sig Dispense Refill  . acetaminophen (TYLENOL) 500 MG tablet Take 500 mg by mouth every 6 (six) hours as needed.    [name] aspirin EC 81 MG tablet Take 1 tablet (81 mg total) by mouth daily.    . carboxymethylcellulose (REFRESH PLUS) 0.5 % SOLN Place 1 drop into both eyes daily as needed (dry eyes). 30 mL 1  . DiphenhydrAMINE HCl, Sleep, (ZZZQUIL  PO) Take 1 Dose by mouth at bedtime as needed (sleep).    . ergocalciferol (VITAMIN D2) 50000 UNITS capsule Take 50,000 Units by mouth every 30 (thirty) days. 15 th of each month    . FIBER PO Take by mouth. Clear Fiber    . folic acid (FOLVITE) 1 MG tablet Take 1 tablet (1 mg total) by mouth daily. 90 tablet 3  . metoprolol succinate (TOPROL XL) 25 MG 24 hr tablet Take 1 tablet (25 mg total) by mouth 2 (two) times daily. Annual appt due in august must see MD for refills 180 tablet 0  . Multiple Vitamins-Minerals (PRESERVISION AREDS PO) Take 1 tablet by mouth daily.     . nitroGLYCERIN (NITROSTAT) 0.4 MG SL tablet DISSOLVE 1 TABLET UNDER THE TONGUE EVERY 5 MINUTES AS NEEDED FOR CHEST PAIN (MAX OF 3 TABLETS) 100 tablet 0  . simvastatin (ZOCOR) 20 MG tablet Take 1 tablet (20 mg total) by mouth daily. Annual appt due in august must see MD for refills 90 tablet 0  . vitamin B-12 (CYANOCOBALAMIN) 500 MCG tablet Take 500 mcg by mouth daily. Take one tablet every 3rd day    . vitamin C (ASCORBIC ACID) 500 MG tablet Take 1,500 mg by mouth daily.     No current facility-administered medications on file prior to visit.     Past Medical History:  Diagnosis Date  . Acute superficial venous thrombosis of lower extremity    a. RLE after CABG, neg dopp for DVT.  [name]  Allergy   . Atrial fibrillation ([place])    a. Post-op from CABG, on amiodarone temporarily, d/c'd 12/2011  . CAD (coronary artery disease)    a. S/P stenting to mid RCA, prox PDA 06/1999. b. NSTEMI/CABG x 3 in 10/2011 with LIMA to LAD, SVG to PDA, and SVG to OM1.   . Chronic UTI    a. Followed by Dr. Risa Grill - colonized/asymptomatic - not on abx  . CKD (chronic kidney disease), stage III    a. stable with a creatinine around 1.9-2.0 followed by nephrology.  . Dyslipidemia   . GERD (gastroesophageal reflux disease)   . Hypertension    well-controlled.  . Myocardial infarction ([place])   . Neck injury    a. C3-C4 and C4-C5 foraminal narrowing,  severe  . Prostate cancer ([place])    a. 2001 s/p TURP.  . Pulmonary nodule    a. felt to be noncancerous.  Status post followup CT scan 4 mm and stable.  . Renal artery stenosis ([place])    a. 50% by cath 2001  . 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 cardia catherization  07-07-99  . CARDIAC SURGERY  10/18/12   open heart surgery  . CATARACT EXTRACTION W/ INTRAOCULAR LENS  IMPLANT, BILATERAL  3/205, 06/2013   [facility]  . COLONOSCOPY  04/12/07  . CORONARY ARTERY BYPASS GRAFT  10/19/2011   Procedure: CORONARY ARTERY BYPASS GRAFTING (CABG);  Surgeon: Gaye Pollack, MD;  Location: [facility];  Service: Open Heart Surgery;  Laterality: N/A;  times three using Left Internal Mammary Artery and Right Greater Saphenouse Vein Graft harvested Endoscopically  . edg  07-17-1994  . FLEXIBLE SIGMOIDOSCOPY  11-03-1997  . LEFT HEART CATHETERIZATION WITH CORONARY ANGIOGRAM N/A 10/16/2011   Procedure: LEFT HEART CATHETERIZATION WITH CORONARY ANGIOGRAM;  Surgeon: Peter M Martinique, MD;  Location: [facility] CATH LAB;  Service: Cardiovascular;  Laterality: N/A;  . LEFT HEART CATHETERIZATION WITH CORONARY/GRAFT ANGIOGRAM N/A 03/11/2013   Procedure: LEFT HEART CATHETERIZATION WITH [name];  Surgeon: Blane Ohara, MD;  Location: [facility] CATH LAB;  Service: Cardiovascular;  Laterality: N/A;  . lumbar spinal disk and neck fusion surgery    . PACEMAKER INSERTION  03/28/12   PPM implanted for mobitz II AV block  . peripheral vascular catherization  11-24-03  . PERMANENT PACEMAKER INSERTION N/A 03/28/2012   Procedure: PERMANENT PACEMAKER INSERTION;  Surgeon: Thompson Grayer, MD;  Location: [facility] CATH LAB;  Service: Cardiovascular;  Laterality: N/A;  . PROSTATECTOMY    . renal circulation  10-01-03  . s/p ptca    . stents     X 2  . stress cardiolite  05-04-05   spring 09-negative except for apical thinning, EF 68%    Social History     Social History  . Marital status: Married    Spouse name: [name]  . Number of children: 2  . Years of education: N/A   Occupational History  . 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  Retired   Social History Main Topics  . Smoking status: Never Smoker  . Smokeless tobacco: Never Used  . Alcohol use Yes     Comment: Rarely  . Drug use: No  . Sexual activity: Not on file   Other Topics Concern  .  Not on file   Social History Narrative   HSG, 1 year college.  married '52 - 3 years, divorced; married '56 - 3 years divorced; married '61-12 yrs - divorced; married '75 -. 1 son '57; 1 daughter - '53; 1 grandchild.  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  Very happily married.  End of life care: yes CPR, no long term mechanical ventilation, no heroic measures.    Family History  Problem Relation Age of Onset  . Coronary artery disease Father        died @ 30  . Other Mother        cerebral hemorrhage - died @ 26  . Arthritis Mother   . Cancer Brother        Bladder  . Prostate cancer Brother   . Nephritis Brother        died @ age 47.  . Other Brother        cerebral hemorrhage - died @ 9  . Heart disease Brother   . Breast cancer Other        niece x 2  . Diabetes Neg Hx   . Colon cancer Neg Hx     Review of Systems  Constitutional: Positive for fatigue. Negative for appetite change, chills and fever.  HENT: Positive for postnasal drip, sore throat and trouble swallowing. Negative for congestion, ear pain, sinus pain and sinus pressure.   Respiratory: Positive for cough (dry). Negative for shortness of breath and wheezing.   Cardiovascular: Negative for chest pain.  Gastrointestinal: Negative for abdominal pain and blood in stool.  Musculoskeletal: Negative for myalgias.  Neurological: Positive for headaches. Negative for dizziness and  light-headedness.       Objective:   Vitals:   10/12/16 1402  BP: (!) 142/88  Pulse: 74  Resp: 16  Temp: 98.4 F (36.9 C)  SpO2: 98%   Filed Weights   10/12/16 1402  Weight: 165 lb (74.8 kg)   Body mass index is 23.01 kg/m.  Wt Readings from Last 3 Encounters:  10/12/16 165 lb (74.8 kg)  09/04/16 168 lb (76.2 kg)  08/02/16 169 lb (76.7 kg)     Physical Exam GENERAL APPEARANCE: Appears stated age, well appearing, NAD EYES: conjunctiva clear, no icterus HEENT: bilateral tympanic membranes and ear canals normal, oropharynx with mild erythema, no thyromegaly, trachea midline, no cervical or supraclavicular lymphadenopathy LUNGS: Clear to auscultation without wheeze or crackles, unlabored breathing, good air entry bilaterally HEART: Normal S1,S2 without murmurs Abdomen: soft, non tender, non distended EXTREMITIES: Without clubbing, cyanosis, or edema       Assessment & Plan:   See Problem List for Assessment and Plan of chronic medical problems.

## 2016-10-12 ENCOUNTER — Ambulatory Visit (INDEPENDENT_AMBULATORY_CARE_PROVIDER_SITE_OTHER): Payer: Medicare Other | Admitting: Internal Medicine

## 2016-10-12 VITALS — BP 142/88 | HR 74 | Temp 98.4°F | Resp 16 | Wt 165.0 lb

## 2016-10-12 DIAGNOSIS — I25810 Atherosclerosis of coronary artery bypass graft(s) without angina pectoris: Secondary | ICD-10-CM

## 2016-10-12 DIAGNOSIS — J069 Acute upper respiratory infection, unspecified: Secondary | ICD-10-CM

## 2016-10-12 MED ORDER — PENICILLIN V POTASSIUM 500 MG PO TABS: 500.0000 mg | ORAL_TABLET | Freq: Two times a day (BID) | ORAL | 0 refills | Status: DC

## 2016-10-12 NOTE — Patient Instructions (Addendum)
Take the antibiotic as prescribed.  Take over the counter medications for symptom relief.    Call if no improvement

## 2016-10-13 ENCOUNTER — Encounter: Payer: Self-pay | Admitting: Internal Medicine

## 2016-10-13 DIAGNOSIS — J069 Acute upper respiratory infection, unspecified: Secondary | ICD-10-CM | POA: Insufficient documentation

## 2016-10-13 NOTE — Assessment & Plan Note (Signed)
Present for 2 weeks w/o improvement Will try an antibiotic - PCN x 10 days otc cold meds for symptom relief Rest, fluids Call if no improvement

## 2016-10-17 ENCOUNTER — Telehealth: Payer: Self-pay | Admitting: Internal Medicine

## 2016-10-17 NOTE — Telephone Encounter (Signed)
Can you please try calling Dr [facility] office - leave a message for Dr Earlean Shawl.  Let me know we saw Alando last week - he is still having abnormal stools.  He has had some subtle weight loss.  No abdominal/rectal pain, no blood.  I was concerned about possible cancer - wondering if he could see him sooner than when he is scheduled.   Thank you!

## 2016-10-17 NOTE — Telephone Encounter (Signed)
Spoke with Dr Liliane Channel office to give MDs note. THey will contact pt if they are able to get him in sooner than Nov 20th. Advised to call us back if they have any questions.

## 2016-10-22 ENCOUNTER — Telehealth: Payer: Self-pay | Admitting: Internal Medicine

## 2016-10-22 ENCOUNTER — Ambulatory Visit (INDEPENDENT_AMBULATORY_CARE_PROVIDER_SITE_OTHER): Payer: Medicare Other | Admitting: Nurse Practitioner

## 2016-10-22 ENCOUNTER — Encounter: Payer: Self-pay | Admitting: Nurse Practitioner

## 2016-10-22 VITALS — BP 140/70 | HR 68 | Temp 98.4°F | Ht 71.0 in | Wt 168.0 lb

## 2016-10-22 DIAGNOSIS — T50B95A Adverse effect of other viral vaccines, initial encounter: Secondary | ICD-10-CM | POA: Diagnosis not present

## 2016-10-22 DIAGNOSIS — T8029XA Infection following other infusion, transfusion and therapeutic injection, initial encounter: Secondary | ICD-10-CM | POA: Diagnosis not present

## 2016-10-22 MED ORDER — MUPIROCIN 2 % EX OINT: 1.0000 "application " | TOPICAL_OINTMENT | Freq: Two times a day (BID) | CUTANEOUS | 0 refills | Status: AC

## 2016-10-22 NOTE — Telephone Encounter (Signed)
Patient Name: [name]  DOB: May 03, 1926    Initial Comment caller states he got a flu shot about 10 days ago- about 4 or 5 days ago his hands started itching and now he is itching all over ( no rash, just itching)    Nurse Assessment  Nurse: Raphael Gibney, RN, Vanita Ingles Date/Time (Eastern Time): 10/22/2016 10:47:51 AM  Confirm and document reason for call. If symptomatic, describe symptoms. ---Caller states he got a flu vaccine about 10 days ago on Oct 6. About a week ago, his hands started itching. then his back started itching. Today he is itching all over. No rash. Has a pimple that has a red area around it at the injection site. BP 137/65 on 10/12. Today it is 153/72.  Does the patient have any new or worsening symptoms? ---Yes  Will a triage be completed? ---Yes  Related visit to physician within the last 2 weeks? ---No  Does the PT have any chronic conditions? (i.e. diabetes, asthma, etc.) ---Yes  List chronic conditions. ---HTN  Is this a behavioral health or substance abuse call? ---No     Guidelines    Guideline Title Affirmed Question Affirmed Notes  Immunization Reactions [1] Redness or red streak around the injection site AND [2] begins > 48 hours after shot AND [3] no fever (Exception: red area < 1 inch or 2.5 cm wide)    Final Disposition User   See Physician within 24 Hours [place], RN, Vera    Comments  appt scheduled for 10/22/2016 at 2 pm with [place]   Referrals  REFERRED TO PCP OFFICE   Caller Disagree/Comply Comply  Caller Understands Yes  PreDisposition Call Doctor

## 2016-10-22 NOTE — Patient Instructions (Addendum)
[name] declined use of oral abx at this time due to current diarrhea caused by previous oral abx use.  Use warm compress twice a day x 3days, then stop. Do not use warm compress when ointment has been applied. Clean with water and soap twice a day.  Return to office if no improvement in 1week.  May use benadryl OTC 12.5mg  to 25mg  every 8hrs prn for itching.  Cellulitis, Adult Cellulitis is a skin infection. The infected area is usually red and sore. This condition occurs most often in the arms and lower legs. It is very important to get treated for this condition. Follow these instructions at home:  Take over-the-counter and prescription medicines only as told by your doctor.  If you were prescribed an antibiotic medicine, take it as told by your doctor. Do not stop taking the antibiotic even if you start to feel better.  Drink enough fluid to keep your pee (urine) clear or pale yellow.  Do not touch or rub the infected area.  Raise (elevate) the infected area above the level of your heart while you are sitting or lying down.  Place warm or cold wet cloths (warm or cold compresses) on the infected area. Do this as told by your doctor.  Keep all follow-up visits as told by your doctor. This is important. These visits let your doctor make sure your infection is not getting worse. Contact a doctor if:  You have a fever.  Your symptoms do not get better after 1-2 days of treatment.  Your bone or joint under the infected area starts to hurt after the skin has healed.  Your infection comes back. This can happen in the same area or another area.  You have a swollen bump in the infected area.  You have new symptoms.  You feel ill and also have muscle aches and pains. Get help right away if:  Your symptoms get worse.  You feel very sleepy.  You throw up (vomit) or have watery poop (diarrhea) for a long time.  There are red streaks coming from the infected  area.  Your red area gets larger.  Your red area turns darker. This information is not intended to replace advice given to you by your health care provider. Make sure you discuss any questions you have with your health care provider. Document Released: 06/13/2007 Document Revised: 06/02/2015 Document Reviewed: 11/03/2014 Elsevier Interactive Patient Education  2018 [employer].

## 2016-10-22 NOTE — Progress Notes (Signed)
Subjective:  Patient ID: [name], male    DOB: 28-Jul-1926  Age: 83 y.o. MRN: [id number]  CC: Rash (red-raise spot on flu injection site--itching all over body. )   Rash  This is a new problem. The current episode started 1 to 4 weeks ago. The problem has been waxing and waning since onset. The affected locations include the left arm (left deltoid muscle). The rash is characterized by itchiness, redness and swelling. Associated with: influenza vaccine administered 10/08/16. Pertinent negatives include no anorexia, congestion, cough, fatigue, fever, joint pain, rhinorrhea, shortness of breath or sore throat. Past treatments include antihistamine. The treatment provided moderate relief.   Rash and itching 4days after flu vaccine 10/08/16. Generalized itching with one bump on left upper arm  Outpatient Medications Prior to Visit  Medication Sig Dispense Refill  . acetaminophen (TYLENOL) 500 MG tablet Take 500 mg by mouth every 6 (six) hours as needed.    [name] aspirin EC 81 MG tablet Take 1 tablet (81 mg total) by mouth daily.    . carboxymethylcellulose (REFRESH PLUS) 0.5 % SOLN Place 1 drop into both eyes daily as needed (dry eyes). 30 mL 1  . DiphenhydrAMINE HCl, Sleep, (ZZZQUIL PO) Take 1 Dose by mouth at bedtime as needed (sleep).    . ergocalciferol (VITAMIN D2) 50000 UNITS capsule Take 50,000 Units by mouth every 30 (thirty) days. 15 th of each month    . FIBER PO Take by mouth. Clear Fiber    . folic acid (FOLVITE) 1 MG tablet Take 1 tablet (1 mg total) by mouth daily. 90 tablet 3  . metoprolol succinate (TOPROL XL) 25 MG 24 hr tablet Take 1 tablet (25 mg total) by mouth 2 (two) times daily. Annual appt due in august must see MD for refills 180 tablet 0  . Multiple Vitamins-Minerals (PRESERVISION AREDS PO) Take 1 tablet by mouth daily.     . nitroGLYCERIN (NITROSTAT) 0.4 MG SL tablet DISSOLVE 1 TABLET UNDER THE TONGUE EVERY 5 MINUTES AS NEEDED FOR CHEST PAIN (MAX OF 3 TABLETS) 100  tablet 0  . penicillin v potassium (VEETID) 500 MG tablet Take 1 tablet (500 mg total) by mouth 2 (two) times daily. 20 tablet 0  . simvastatin (ZOCOR) 20 MG tablet Take 1 tablet (20 mg total) by mouth daily. Annual appt due in august must see MD for refills 90 tablet 0  . vitamin B-12 (CYANOCOBALAMIN) 500 MCG tablet Take 500 mcg by mouth daily. Take one tablet every 3rd day    . vitamin C (ASCORBIC ACID) 500 MG tablet Take 1,500 mg by mouth daily.     No facility-administered medications prior to visit.     ROS See HPI  Objective:  BP 140/70   Pulse 68   Temp 98.4 F (36.9 C)   Ht 5\' 11"  (1.803 m)   Wt 168 lb (76.2 kg)   SpO2 98%   BMI 23.43 kg/m   BP Readings from Last 3 Encounters:  10/22/16 140/70  10/12/16 (!) 142/88  09/04/16 (!) 146/74    Wt Readings from Last 3 Encounters:  10/22/16 168 lb (76.2 kg)  10/12/16 165 lb (74.8 kg)  09/04/16 168 lb (76.2 kg)    Physical Exam  Constitutional: He is oriented to person, place, and time.  Cardiovascular: Normal rate.   Musculoskeletal: He exhibits no edema.  Lymphadenopathy:    He has no cervical adenopathy.  Neurological: He is alert and oriented to person, place, and time.  Skin:  Rash noted. Rash is pustular. There is erythema.     Vitals reviewed.   Lab Results  Component Value Date   WBC 8.4 09/04/2016   HGB 13.9 09/04/2016   HCT 42.8 09/04/2016   PLT 250.0 09/04/2016   GLUCOSE 97 09/04/2016   CHOL 122 09/04/2016   TRIG 59.0 09/04/2016   HDL 40.40 09/04/2016   LDLCALC 70 09/04/2016   ALT 11 09/04/2016   AST 14 09/04/2016   NA 140 09/04/2016   K 4.8 09/04/2016   CL 105 09/04/2016   CREATININE 2.15 (H) 09/04/2016   BUN 38 (H) 09/04/2016   CO2 27 09/04/2016   TSH 2.58 09/04/2016   PSA 0.44 07/18/2007   INR 1.2 (H) 03/04/2013    Ct Lumbar Spine Wo Contrast  Result Date: 06/18/2016 CLINICAL DATA:  Low back pain with radiculopathy. Bilateral leg pain EXAM: CT LUMBAR SPINE WITHOUT CONTRAST  TECHNIQUE: Multidetector CT imaging of the lumbar spine was performed without intravenous contrast administration. Multiplanar CT image reconstructions were also generated. COMPARISON:  None. FINDINGS: Segmentation: Normal Alignment: Normal Vertebrae: Negative for fracture or mass. Negative for metastatic disease Paraspinal and other soft tissues: Atherosclerotic calcification. No aortic aneurysm. 3.5 cm right renal cyst. Disc levels: L1-2: Disc degeneration and spurring. Mild facet degeneration. No significant stenosis L2-3: Disc degeneration and facet degeneration without significant stenosis L3-4: Moderate disc degeneration and spurring. Bilateral facet hypertrophy. Mild spinal stenosis L4-5: Right laminotomy. Moderate disc degeneration and spurring. Moderate facet hypertrophy. No significant stenosis L5-S1: Moderate disc degeneration and facet degeneration. No significant spinal or foraminal stenosis. IMPRESSION: Postop laminectomy on the right at L4-5 without recurrent disc protrusion. Moderate disc degeneration and spurring at L4-5 Multilevel disc and facet degeneration throughout the lumbar spine. Mild spinal stenosis at L3-4. Electronically Signed   By: Franchot Gallo M.D.   On: 06/18/2016 15:30    Assessment & Plan:   [name] was seen today for rash.  Diagnoses and all orders for this visit:  Infection of injection site, initial encounter -     mupirocin ointment (BACTROBAN) 2 %; Apply 1 application topically 2 (two) times daily. Apply with dressing change  Local reaction to influenza vaccine, initial encounter   I am having Mr. [name] start on mupirocin ointment. I am also having him maintain his ergocalciferol, vitamin C, carboxymethylcellulose, (DiphenhydrAMINE HCl, Sleep, (ZZZQUIL PO)), vitamin B-12, Multiple Vitamins-Minerals (PRESERVISION AREDS PO), aspirin EC, nitroGLYCERIN, folic acid, simvastatin, metoprolol succinate, acetaminophen, FIBER PO, and penicillin v potassium.  Meds  ordered this encounter  Medications  . mupirocin ointment (BACTROBAN) 2 %    Sig: Apply 1 application topically 2 (two) times daily. Apply with dressing change    Dispense:  10 g    Refill:  0    Order Specific Question:   Supervising Provider    Answer:   Cassandria Anger [[id number]]    Follow-up: Return if symptoms worsen or fail to improve.  Wilfred Lacy, NP

## 2016-10-25 ENCOUNTER — Encounter: Payer: Self-pay | Admitting: Nurse Practitioner

## 2016-10-25 ENCOUNTER — Ambulatory Visit (INDEPENDENT_AMBULATORY_CARE_PROVIDER_SITE_OTHER): Payer: Medicare Other | Admitting: Nurse Practitioner

## 2016-10-25 VITALS — BP 140/72 | HR 63 | Temp 97.7°F | Ht 71.0 in | Wt 167.0 lb

## 2016-10-25 DIAGNOSIS — T8029XD Infection following other infusion, transfusion and therapeutic injection, subsequent encounter: Secondary | ICD-10-CM

## 2016-10-25 NOTE — Progress Notes (Signed)
Subjective:  Patient ID: [name], male    DOB: April 02, 1926  Age: 83 y.o. MRN: [id number]  CC: Pain (painful on injection site,red, burning sensation started last night. took benadryl. )   Rash  This is a new problem. The current episode started 1 to 4 weeks ago. The problem has been gradually improving since onset. The affected locations include the left arm. The rash is characterized by redness and pain. Associated with: injection administered 10/08/2016. Pertinent negatives include no anorexia, fatigue, fever, joint pain or shortness of breath. Past treatments include antibiotic cream. The treatment provided significant relief.  has intermittent sharp pain at injection site at night. Generalized itching has resolved.  Outpatient Medications Prior to Visit  Medication Sig Dispense Refill  . acetaminophen (TYLENOL) 500 MG tablet Take 500 mg by mouth every 6 (six) hours as needed.    [name] aspirin EC 81 MG tablet Take 1 tablet (81 mg total) by mouth daily.    . carboxymethylcellulose (REFRESH PLUS) 0.5 % SOLN Place 1 drop into both eyes daily as needed (dry eyes). 30 mL 1  . DiphenhydrAMINE HCl, Sleep, (ZZZQUIL PO) Take 1 Dose by mouth at bedtime as needed (sleep).    . ergocalciferol (VITAMIN D2) 50000 UNITS capsule Take 50,000 Units by mouth every 30 (thirty) days. 15 th of each month    . FIBER PO Take by mouth. Clear Fiber    . folic acid (FOLVITE) 1 MG tablet Take 1 tablet (1 mg total) by mouth daily. 90 tablet 3  . metoprolol succinate (TOPROL XL) 25 MG 24 hr tablet Take 1 tablet (25 mg total) by mouth 2 (two) times daily. Annual appt due in august must see MD for refills 180 tablet 0  . Multiple Vitamins-Minerals (PRESERVISION AREDS PO) Take 1 tablet by mouth daily.     . mupirocin ointment (BACTROBAN) 2 % Apply 1 application topically 2 (two) times daily. Apply with dressing change 10 g 0  . nitroGLYCERIN (NITROSTAT) 0.4 MG SL tablet DISSOLVE 1 TABLET UNDER THE TONGUE EVERY 5  MINUTES AS NEEDED FOR CHEST PAIN (MAX OF 3 TABLETS) 100 tablet 0  . penicillin v potassium (VEETID) 500 MG tablet Take 1 tablet (500 mg total) by mouth 2 (two) times daily. 20 tablet 0  . simvastatin (ZOCOR) 20 MG tablet Take 1 tablet (20 mg total) by mouth daily. Annual appt due in august must see MD for refills 90 tablet 0  . vitamin B-12 (CYANOCOBALAMIN) 500 MCG tablet Take 500 mcg by mouth daily. Take one tablet every 3rd day    . vitamin C (ASCORBIC ACID) 500 MG tablet Take 1,500 mg by mouth daily.     No facility-administered medications prior to visit.     ROS See HPI  Objective:  BP 140/72   Pulse 63   Temp 97.7 F (36.5 C)   Ht 5\' 11"  (1.803 m)   Wt 167 lb (75.8 kg)   SpO2 97%   BMI 23.29 kg/m   BP Readings from Last 3 Encounters:  10/25/16 140/72  10/22/16 140/70  10/12/16 (!) 142/88    Wt Readings from Last 3 Encounters:  10/25/16 167 lb (75.8 kg)  10/22/16 168 lb (76.2 kg)  10/12/16 165 lb (74.8 kg)    Physical Exam  Cardiovascular: Normal rate.   Pulmonary/Chest: Effort normal.  Abdominal: Soft.  Lymphadenopathy:    He has no cervical adenopathy.    He has no axillary adenopathy.  Skin: Rash noted. Rash is maculopapular.  Rash is not pustular. No erythema.     Psychiatric: He has a normal mood and affect. His behavior is normal.  Vitals reviewed.   Lab Results  Component Value Date   WBC 8.4 09/04/2016   HGB 13.9 09/04/2016   HCT 42.8 09/04/2016   PLT 250.0 09/04/2016   GLUCOSE 97 09/04/2016   CHOL 122 09/04/2016   TRIG 59.0 09/04/2016   HDL 40.40 09/04/2016   LDLCALC 70 09/04/2016   ALT 11 09/04/2016   AST 14 09/04/2016   NA 140 09/04/2016   K 4.8 09/04/2016   CL 105 09/04/2016   CREATININE 2.15 (H) 09/04/2016   BUN 38 (H) 09/04/2016   CO2 27 09/04/2016   TSH 2.58 09/04/2016   PSA 0.44 07/18/2007   INR 1.2 (H) 03/04/2013    Ct Lumbar Spine Wo Contrast  Result Date: 06/18/2016 CLINICAL DATA:  Low back pain with radiculopathy.  Bilateral leg pain EXAM: CT LUMBAR SPINE WITHOUT CONTRAST TECHNIQUE: Multidetector CT imaging of the lumbar spine was performed without intravenous contrast administration. Multiplanar CT image reconstructions were also generated. COMPARISON:  None. FINDINGS: Segmentation: Normal Alignment: Normal Vertebrae: Negative for fracture or mass. Negative for metastatic disease Paraspinal and other soft tissues: Atherosclerotic calcification. No aortic aneurysm. 3.5 cm right renal cyst. Disc levels: L1-2: Disc degeneration and spurring. Mild facet degeneration. No significant stenosis L2-3: Disc degeneration and facet degeneration without significant stenosis L3-4: Moderate disc degeneration and spurring. Bilateral facet hypertrophy. Mild spinal stenosis L4-5: Right laminotomy. Moderate disc degeneration and spurring. Moderate facet hypertrophy. No significant stenosis L5-S1: Moderate disc degeneration and facet degeneration. No significant spinal or foraminal stenosis. IMPRESSION: Postop laminectomy on the right at L4-5 without recurrent disc protrusion. Moderate disc degeneration and spurring at L4-5 Multilevel disc and facet degeneration throughout the lumbar spine. Mild spinal stenosis at L3-4. Electronically Signed   By: Franchot Gallo M.D.   On: 06/18/2016 15:30    Assessment & Plan:   [name] was seen today for pain.  Diagnoses and all orders for this visit:  Infection of injection site, subsequent encounter   I am having [name] maintain his ergocalciferol, vitamin C, carboxymethylcellulose, (DiphenhydrAMINE HCl, Sleep, (ZZZQUIL PO)), vitamin B-12, Multiple Vitamins-Minerals (PRESERVISION AREDS PO), aspirin EC, nitroGLYCERIN, folic acid, simvastatin, metoprolol succinate, acetaminophen, FIBER PO, penicillin v potassium, and mupirocin ointment.  No orders of the defined types were placed in this encounter.   Follow-up: No Follow-up on file.  Mike Lacy, NP

## 2016-10-25 NOTE — Patient Instructions (Signed)
Redness and swelling has improved. Stop antibiotics ointment. Leave bandaid off. Use topical benadryl as needed for itching.

## 2016-11-14 ENCOUNTER — Ambulatory Visit (INDEPENDENT_AMBULATORY_CARE_PROVIDER_SITE_OTHER): Payer: Medicare Other | Admitting: Internal Medicine

## 2016-11-14 ENCOUNTER — Encounter: Payer: Self-pay | Admitting: Internal Medicine

## 2016-11-14 VITALS — BP 160/80 | HR 88 | Temp 97.9°F | Resp 16

## 2016-11-14 DIAGNOSIS — T7840XA Allergy, unspecified, initial encounter: Secondary | ICD-10-CM | POA: Insufficient documentation

## 2016-11-14 DIAGNOSIS — I25810 Atherosclerosis of coronary artery bypass graft(s) without angina pectoris: Secondary | ICD-10-CM | POA: Diagnosis not present

## 2016-11-14 DIAGNOSIS — T7840XD Allergy, unspecified, subsequent encounter: Secondary | ICD-10-CM

## 2016-11-14 MED ORDER — METHYLPREDNISOLONE 4 MG PO TBPK
ORAL_TABLET | ORAL | 0 refills | Status: DC
Start: 2016-11-14 — End: 2017-04-04

## 2016-11-14 NOTE — Progress Notes (Signed)
Subjective:    Patient ID: [name], male    DOB: 20-Oct-1926, 83 y.o.   MRN: [id number]  HPI He is here for an acute visit.   He received the flu vaccine on 10/10/16.  On 10/17/16 he started to have bilateral hand itching and it progressed to him itching all over.  He hasnot had a rash.  He has a red raised spot at the flu injection site.  He saw Baldo Ash on 10/15 and was prescribed Bactroban for the local reaction at the site of the flu vaccine.  He returned on 10/25/16.  His symptoms had improved and it was advised he stop the Bactroban ointment.  His symptoms got worse again.   He now has itching all over.  He has redness on the left arm and he can tell it is there, but has no pain.  He is concerned he is allergic to latex.    He restarted the Bactroban yesterday. He is using benadryl cream as needed. He has taken benadryl at night.  The itching has gotten worse again.    He denies any other exposures that may have cause his current symptoms.      Medications and allergies reviewed with patient and updated if appropriate.  Patient Active Problem List   Diagnosis Date Noted  . Incontinence of feces 06/26/2016  . Loose stools 02/06/2016  . Dysphagia 02/06/2016  . Cephalalgia 07/14/2015  . Constipation 03/01/2015  . Claudication of left lower extremity ([place]) 03/02/2014  . 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 Symptomatic bradycardia 03/26/2012  . Pulmonary nodule   . CAD - CABG Oct 2013   . Chronic renal impairment, stage 3 (moderate) (HCC)   . Venous insufficiency of leg 06/05/2010  . PERSISTENT DISORDER INITIATING/MAINTAINING SLEEP 11/09/2009  . SHOULDER PAIN, RIGHT, CHRONIC 10/14/2009  . B12 nutritional deficiency 08/23/2009  . Peripheral neuropathy 03/21/2009  . DEGENERATIVE DISC DISEASE, CERVICAL SPINE, W/RADICULOPATHY 09/21/2008  . Dyslipidemia 01/17/2007  . Essential hypertension 07/20/2006  . PROSTATE CANCER, HX OF- surgery 2001  07/20/2006    Current Outpatient Medications on File Prior to Visit  Medication Sig Dispense Refill  . acetaminophen (TYLENOL) 500 MG tablet Take 500 mg by mouth every 6 (six) hours as needed.    [name] aspirin EC 81 MG tablet Take 1 tablet (81 mg total) by mouth daily.    . carboxymethylcellulose (REFRESH PLUS) 0.5 % SOLN Place 1 drop into both eyes daily as needed (dry eyes). 30 mL 1  . DiphenhydrAMINE HCl, Sleep, (ZZZQUIL PO) Take 1 Dose by mouth at bedtime as needed (sleep).    . ergocalciferol (VITAMIN D2) 50000 UNITS capsule Take 50,000 Units by mouth every 30 (thirty) days. 15 th of each month    . FIBER PO Take by mouth. Clear Fiber    . folic acid (FOLVITE) 1 MG tablet Take 1 tablet (1 mg total) by mouth daily. 90 tablet 3  . metoprolol succinate (TOPROL XL) 25 MG 24 hr tablet Take 1 tablet (25 mg total) by mouth 2 (two) times daily. Annual appt due in august must see MD for refills 180 tablet 0  . Multiple Vitamins-Minerals (PRESERVISION AREDS PO) Take 1 tablet by mouth daily.     . nitroGLYCERIN (NITROSTAT) 0.4 MG SL tablet DISSOLVE 1 TABLET UNDER THE TONGUE EVERY 5 MINUTES AS NEEDED FOR CHEST PAIN (MAX OF 3 TABLETS) 100 tablet 0  . simvastatin (ZOCOR) 20 MG tablet Take 1 tablet (20  mg total) by mouth daily. Annual appt due in august must see MD for refills 90 tablet 0  . vitamin B-12 (CYANOCOBALAMIN) 500 MCG tablet Take 500 mcg by mouth daily. Take one tablet every 3rd day    . vitamin C (ASCORBIC ACID) 500 MG tablet Take 1,500 mg by mouth daily.     No current facility-administered medications on file prior to visit.     Past Medical History:  Diagnosis Date  . Acute superficial venous thrombosis of lower extremity    a. RLE after CABG, neg dopp for DVT.  [name] Allergy   . Atrial fibrillation ([place])    a. Post-op from CABG, on amiodarone temporarily, d/c'd 12/2011  . CAD (coronary artery disease)    a. S/P stenting to mid RCA, prox PDA 06/1999. b. NSTEMI/CABG x 3 in 10/2011 with LIMA  to LAD, SVG to PDA, and SVG to OM1.   . Chronic UTI    a. Followed by Dr. Risa Grill - colonized/asymptomatic - not on abx  . CKD (chronic kidney disease), stage III (HCC)    a. stable with a creatinine around 1.9-2.0 followed by nephrology.  . Dyslipidemia   . GERD (gastroesophageal reflux disease)   . Hypertension    well-controlled.  . Myocardial infarction ([place])   . Neck injury    a. C3-C4 and C4-C5 foraminal narrowing, severe  . Prostate cancer ([place])    a. 2001 s/p TURP.  . Pulmonary nodule    a. felt to be noncancerous.  Status post followup CT scan 4 mm and stable.  . Renal artery stenosis ([place])    a. 50% by cath 2001  . 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 cardia catherization  07-07-99  . CARDIAC SURGERY  10/18/12   open heart surgery  . CATARACT EXTRACTION W/ INTRAOCULAR LENS  IMPLANT, BILATERAL  3/205, 06/2013   [facility]  . COLONOSCOPY  04/12/07  . edg  07-17-1994  . FLEXIBLE SIGMOIDOSCOPY  11-03-1997  . lumbar spinal disk and neck fusion surgery    . PACEMAKER INSERTION  03/28/12   PPM implanted for mobitz II AV block  . peripheral vascular catherization  11-24-03  . PROSTATECTOMY    . renal circulation  10-01-03  . s/p ptca    . stents     X 2  . stress cardiolite  05-04-05   spring 09-negative except for apical thinning, EF 68%    Social History   Socioeconomic History  . Marital status: Married    Spouse name: [name]  . Number of children: 2  . Years of education: None  . Highest education level: None  Social Needs  . Financial resource strain: None  . Food insecurity - worry: None  . Food insecurity - inability: None  . Transportation needs - medical: None  . Transportation needs - non-medical: None  Occupational History  . 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 Smoking status: Never Smoker  . Smokeless tobacco: Never Used  Substance and Sexual Activity  . Alcohol use: Yes    Comment: Rarely  . Drug use: No  . Sexual activity: None  Other Topics Concern  . None  Social History Narrative   HSG,  1 year college.  married '52 - 3 years, divorced; married '56 - 3 years divorced; married '77-12 yrs - divorced; married '75 -. 1 son '57; 1 daughter - '53; 1 grandchild.  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  Very happily married.  End of life care: yes CPR, no long term mechanical ventilation, no heroic measures.    Family History  Problem Relation Age of Onset  . Coronary artery disease Father        died @ 44  . Other Mother        cerebral hemorrhage - died @ 40  . Arthritis Mother   . Cancer Brother        Bladder  . Prostate cancer Brother   . Nephritis Brother        died @ age 30.  . Other Brother        cerebral hemorrhage - died @ 33  . Heart disease Brother   . Breast cancer Other        niece x 2  . Diabetes Neg Hx   . Colon cancer Neg Hx     Review of Systems  Constitutional: Negative for chills and fever.  Respiratory: Negative for cough, shortness of breath and wheezing.   Skin: Positive for color change (redness at site of injection). Negative for rash.  Neurological: Negative for light-headedness and headaches.       Objective:   Vitals:   11/14/16 1421  BP: (!) 160/80  Pulse: 88  Resp: 16  Temp: 97.9 F (36.6 C)  SpO2: 98%   There were no vitals filed for this visit. There is no height or weight on file to calculate BMI.  Wt Readings from Last 3 Encounters:  10/25/16 167 lb (75.8 kg)  10/22/16 168 lb (76.2 kg)  10/12/16 165 lb (74.8 kg)     Physical Exam  Constitutional: He appears well-developed and well-nourished. No distress.  HENT:  Head: Normocephalic and atraumatic.  Eyes: Conjunctivae  are normal.  Musculoskeletal: He exhibits no edema.  Skin: Skin is warm and dry. He is not diaphoretic. There is erythema (mild blotchy erythema left upper lateral arm where he had the flu injection - no tendernesss or swelling).  No rash throughout body          Assessment & Plan:   See Problem List for Assessment and Plan of chronic medical problems.

## 2016-11-14 NOTE — Assessment & Plan Note (Signed)
?   Reaction to the flu shot, he is concerned it may be a allergy to latex Initially there was what looked like an infection at the site of the injection Can continue bactroban Itching all over - ? Allergy claritin daily in am, benadryl at night prn Medrol dose pak if no improvement

## 2016-11-14 NOTE — Patient Instructions (Signed)
Start claritin daily in the morning.  You can take benadryl as needed.  Use the bactroban on the left arm.    Take the steroid taper if needed.

## 2016-11-25 ENCOUNTER — Telehealth: Payer: Self-pay | Admitting: Internal Medicine

## 2016-11-26 MED ORDER — METOPROLOL SUCCINATE ER 25 MG PO TB24: ORAL_TABLET | ORAL | 3 refills | Status: DC

## 2016-11-26 MED ORDER — METOPROLOL SUCCINATE ER 25 MG PO TB24: ORAL_TABLET | ORAL | 0 refills | Status: DC

## 2016-11-26 NOTE — Telephone Encounter (Signed)
Pt made a mistake and did not mean to order his Metoprolol from [facility],  He would like 2 weeks worth of tis sent into [facility] while he waits for it to be delivered.  Please advise

## 2016-11-26 NOTE — Addendum Note (Signed)
Addended by: Terence Lux B on: 11/26/2016 10:50 AM   Modules accepted: Orders

## 2016-12-14 ENCOUNTER — Other Ambulatory Visit: Payer: Self-pay | Admitting: Internal Medicine

## 2016-12-14 ENCOUNTER — Telehealth: Payer: Self-pay | Admitting: Internal Medicine

## 2016-12-14 NOTE — Telephone Encounter (Signed)
Copied from [place]. Topic: Quick Communication - See Telephone Encounter >> Dec 14, 2016  4:00 PM Vernona Rieger wrote: Pt states he gets his TOPORALXI 25 mg in the mail and they advised him that they shipped it on 11/27 & they are not sure why its running late, but advised him to call his PCP & see if they will give him a 7 day supply. It would be 14 pills. The override number is [phone]. Pharmacy [facility] [address].

## 2016-12-19 NOTE — Telephone Encounter (Signed)
Per chart MD sent rx on 12/15/16 pt has been made aware...[name]

## 2017-01-02 ENCOUNTER — Encounter: Payer: Medicare Other | Admitting: *Deleted

## 2017-01-07 ENCOUNTER — Other Ambulatory Visit: Payer: Self-pay | Admitting: Internal Medicine

## 2017-01-09 NOTE — Telephone Encounter (Signed)
Do not see this med on medication list. Please advise

## 2017-01-09 NOTE — Telephone Encounter (Signed)
I do not think he is taking this -- refuse.

## 2017-01-10 ENCOUNTER — Encounter: Payer: Self-pay | Admitting: Internal Medicine

## 2017-01-10 ENCOUNTER — Ambulatory Visit (INDEPENDENT_AMBULATORY_CARE_PROVIDER_SITE_OTHER): Payer: Medicare Other | Admitting: *Deleted

## 2017-01-10 ENCOUNTER — Ambulatory Visit (INDEPENDENT_AMBULATORY_CARE_PROVIDER_SITE_OTHER): Payer: Self-pay | Admitting: *Deleted

## 2017-01-10 DIAGNOSIS — I441 Atrioventricular block, second degree: Secondary | ICD-10-CM | POA: Diagnosis not present

## 2017-01-10 DIAGNOSIS — M7060 Trochanteric bursitis, unspecified hip: Secondary | ICD-10-CM | POA: Insufficient documentation

## 2017-01-10 HISTORY — DX: Trochanteric bursitis, unspecified hip: M70.60

## 2017-01-11 NOTE — Progress Notes (Signed)
Patient presents to the office for help w/ using his [employer]. App was deleted then restored from his icloud. Sending the transmission was successful once this was performed. Will process remote transmission and send letter with next remote date. Patient verbalized understanding.

## 2017-01-14 ENCOUNTER — Telehealth: Payer: Self-pay | Admitting: Internal Medicine

## 2017-01-14 NOTE — Telephone Encounter (Signed)
Spoke with patient and confirmed that we successfully received remote transmission for 1/3. Patient verbalized understanding.

## 2017-01-14 NOTE — Telephone Encounter (Signed)
New message   Patient calling with concerns about "NO SHOW"  Showing up in MyChart for 01/11/16. Advised patient to contact MyChart. Also informed patient I see the no show as 12/26. Patient wants to be sure readings were received on 1/3.  Please call   1. Has your device fired? NO  2. Is you device beeping? NO  3. Are you experiencing draining or swelling at device site? NO  4. Are you calling to see if we received your device transmission? YES for 1/3  5. Have you passed out? NO    Please route to [place]

## 2017-01-15 ENCOUNTER — Encounter: Payer: Self-pay | Admitting: Cardiology

## 2017-01-15 NOTE — Progress Notes (Signed)
Remote pacemaker transmission.   

## 2017-01-22 LAB — CUP PACEART REMOTE DEVICE CHECK
Implantable Lead Implant Date: 20140321
Implantable Lead Implant Date: 20140321
Implantable Lead Location: 753860
Lead Channel Setting Pacing Amplitude: 2 V
Lead Channel Setting Pacing Amplitude: 2.5 V
Lead Channel Setting Sensing Sensitivity: 5.6 mV
MDC IDC LEAD LOCATION: 753859
MDC IDC PG IMPLANT DT: 20140321
MDC IDC SESS DTM: 20190115132413
MDC IDC SET LEADCHNL RV PACING PULSEWIDTH: 0.4 ms

## 2017-02-05 ENCOUNTER — Other Ambulatory Visit: Payer: Self-pay | Admitting: Orthopedic Surgery

## 2017-02-05 DIAGNOSIS — M7061 Trochanteric bursitis, right hip: Secondary | ICD-10-CM

## 2017-02-05 DIAGNOSIS — M7062 Trochanteric bursitis, left hip: Principal | ICD-10-CM

## 2017-02-13 ENCOUNTER — Ambulatory Visit
Admission: RE | Admit: 2017-02-13 | Discharge: 2017-02-13 | Disposition: A | Discharge status: Home / Self Care | Payer: Medicare Other | Source: Ambulatory Visit | Attending: Orthopedic Surgery | Admitting: Orthopedic Surgery

## 2017-02-13 DIAGNOSIS — M7061 Trochanteric bursitis, right hip: Secondary | ICD-10-CM

## 2017-02-13 DIAGNOSIS — M7062 Trochanteric bursitis, left hip: Principal | ICD-10-CM

## 2017-03-18 ENCOUNTER — Other Ambulatory Visit (INDEPENDENT_AMBULATORY_CARE_PROVIDER_SITE_OTHER): Payer: Self-pay | Admitting: Otolaryngology

## 2017-03-18 DIAGNOSIS — J329 Chronic sinusitis, unspecified: Secondary | ICD-10-CM

## 2017-03-22 ENCOUNTER — Ambulatory Visit
Admission: RE | Admit: 2017-03-22 | Discharge: 2017-03-22 | Disposition: A | Discharge status: Home / Self Care | Payer: Medicare Other | Source: Ambulatory Visit | Attending: Otolaryngology | Admitting: Otolaryngology

## 2017-03-22 DIAGNOSIS — J329 Chronic sinusitis, unspecified: Secondary | ICD-10-CM

## 2017-03-26 ENCOUNTER — Encounter: Payer: Self-pay | Admitting: Internal Medicine

## 2017-04-04 ENCOUNTER — Encounter: Payer: Self-pay | Admitting: Internal Medicine

## 2017-04-04 ENCOUNTER — Ambulatory Visit (INDEPENDENT_AMBULATORY_CARE_PROVIDER_SITE_OTHER): Payer: Medicare Other | Admitting: Internal Medicine

## 2017-04-04 VITALS — BP 124/72 | HR 62 | Ht 71.0 in | Wt 169.0 lb

## 2017-04-04 DIAGNOSIS — R001 Bradycardia, unspecified: Secondary | ICD-10-CM

## 2017-04-04 DIAGNOSIS — I441 Atrioventricular block, second degree: Secondary | ICD-10-CM | POA: Diagnosis not present

## 2017-04-04 DIAGNOSIS — I10 Essential (primary) hypertension: Secondary | ICD-10-CM | POA: Diagnosis not present

## 2017-04-04 NOTE — Progress Notes (Signed)
PCP: [name], MD Primary Cardiologist:  Dr Burt Knack Primary EP:  Dr Cherylann Ratel is a 84 y.o. male who presents today for routine electrophysiology followup.  Since last being seen in our clinic, the patient reports doing reasonably well.  He has occasional fatigue and SOB.  He also has trouble with his knees and leg weakness.  These appear to be chronic.  Today, he denies symptoms of palpitations, chest pain,  lower extremity edema, dizziness, presyncope, or syncope.  The patient is otherwise without complaint today.   Past Medical History:  Diagnosis Date  . Acute superficial venous thrombosis of lower extremity    a. RLE after CABG, neg dopp for DVT.  [name] Allergy   . Atrial fibrillation ([place])    a. Post-op from CABG, on amiodarone temporarily, d/c'd 12/2011  . CAD (coronary artery disease)    a. S/P stenting to mid RCA, prox PDA 06/1999. b. NSTEMI/CABG x 3 in 10/2011 with LIMA to LAD, SVG to PDA, and SVG to OM1.   . Chronic UTI    a. Followed by Dr. Risa Grill - colonized/asymptomatic - not on abx  . CKD (chronic kidney disease), stage III (HCC)    a. stable with a creatinine around 1.9-2.0 followed by nephrology.  . Dyslipidemia   . GERD (gastroesophageal reflux disease)   . Hypertension    well-controlled.  . Myocardial infarction ([place])   . Neck injury    a. C3-C4 and C4-C5 foraminal narrowing, severe  . Prostate cancer ([place])    a. 2001 s/p TURP.  . Pulmonary nodule    a. felt to be noncancerous.  Status post followup CT scan 4 mm and stable.  . Renal artery stenosis ([place])    a. 50% by cath 2001  . 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 cardia catherization  07-07-99  . CARDIAC SURGERY  10/18/12   open heart surgery  . CATARACT EXTRACTION W/ INTRAOCULAR LENS  IMPLANT, BILATERAL  3/205, 06/2013   [facility]  . COLONOSCOPY  04/12/07  . CORONARY ARTERY BYPASS GRAFT   10/19/2011   Procedure: CORONARY ARTERY BYPASS GRAFTING (CABG);  Surgeon: Gaye Pollack, MD;  Location: [facility];  Service: Open Heart Surgery;  Laterality: N/A;  times three using Left Internal Mammary Artery and Right Greater Saphenouse Vein Graft harvested Endoscopically  . edg  07-17-1994  . FLEXIBLE SIGMOIDOSCOPY  11-03-1997  . LEFT HEART CATHETERIZATION WITH CORONARY ANGIOGRAM N/A 10/16/2011   Procedure: LEFT HEART CATHETERIZATION WITH CORONARY ANGIOGRAM;  Surgeon: Peter M Martinique, MD;  Location: [facility] CATH LAB;  Service: Cardiovascular;  Laterality: N/A;  . LEFT HEART CATHETERIZATION WITH CORONARY/GRAFT ANGIOGRAM N/A 03/11/2013   Procedure: LEFT HEART CATHETERIZATION WITH [name];  Surgeon: Blane Ohara, MD;  Location: [facility] CATH LAB;  Service: Cardiovascular;  Laterality: N/A;  . lumbar spinal disk and neck fusion surgery    . PACEMAKER INSERTION  03/28/12   PPM implanted for mobitz II AV block  . peripheral vascular catherization  11-24-03  . PERMANENT PACEMAKER INSERTION N/A 03/28/2012   Procedure: PERMANENT PACEMAKER INSERTION;  Surgeon: Thompson Grayer, MD;  Location: [facility]ATH LAB;  Service: Cardiovascular;  Laterality: N/A;  . PROSTATECTOMY    . renal circulation  10-01-03  . s/p ptca    . stents     X 2  . stress cardiolite  05-04-05   spring  09-negative except for apical thinning, EF 68%    ROS- all systems are reviewed and negative except as per HPI above  Current Outpatient Medications  Medication Sig Dispense Refill  . acetaminophen (TYLENOL) 500 MG tablet Take 500 mg by mouth every 6 (six) hours as needed.    [name] aspirin EC 81 MG tablet Take 1 tablet (81 mg total) by mouth daily.    . carboxymethylcellulose (REFRESH PLUS) 0.5 % SOLN Place 1 drop into both eyes daily as needed (dry eyes). 30 mL 1  . DiphenhydrAMINE HCl, Sleep, (ZZZQUIL PO) Take 1 Dose by mouth at bedtime as needed (sleep).    . ergocalciferol (VITAMIN D2) 50000 UNITS capsule Take 50,000 Units by mouth  every 30 (thirty) days. 15 th of each month    . folic acid (FOLVITE) 1 MG tablet TAKE 1 TABLET DAILY 90 tablet 3  . metoprolol succinate (TOPROL-XL) 25 MG 24 hr tablet take 1 tablet twice a day 28 tablet 0  . Multiple Vitamins-Minerals (PRESERVISION AREDS PO) Take 1 tablet by mouth daily.     . nitroGLYCERIN (NITROSTAT) 0.4 MG SL tablet DISSOLVE 1 TABLET UNDER THE TONGUE EVERY 5 MINUTES AS NEEDED FOR CHEST PAIN (MAX OF 3 TABLETS) 100 tablet 0  . simvastatin (ZOCOR) 20 MG tablet TAKE 1 TABLET DAILY (ANNUAL APPOINTMENT DUE IN [place]. MUST SEE DOCTOR FOR REFILLS) 90 tablet 3  . vitamin B-12 (CYANOCOBALAMIN) 500 MCG tablet Take 500 mcg by mouth daily. Take one tablet every 3rd day    . vitamin C (ASCORBIC ACID) 500 MG tablet Take 1,000 mg by mouth daily.     . fluticasone (FLONASE) 50 MCG/ACT nasal spray Place 2 sprays into the nose 2 (two) times daily.    [name] ipratropium (ATROVENT) 0.06 % nasal spray Place 2 sprays into the nose 2 (two) times daily.  5   No current facility-administered medications for this visit.     Physical Exam: Vitals:   04/04/17 1203  BP: 124/72  Pulse: 62  Weight: 76.7 kg (169 lb)  Height: 5\' 11"  (1.803 m)    GEN- The patient is well appearing, alert and oriented x 3 today.   Head- normocephalic, atraumatic Eyes-  Sclera clear, conjunctiva pink Ears- hearing intact Oropharynx- clear Lungs- Clear to ausculation bilaterally, normal work of breathing Chest- pacemaker pocket is well healed Heart- Regular rate and rhythm, no murmurs, rubs or gallops, PMI not laterally displaced GI- soft, NT, ND, + BS Extremities- no clubbing, cyanosis, or edema  Pacemaker interrogation- reviewed in detail today,  See PACEART report  ekg tracing ordered today is personally reviewed and shows sinus rhythm 62 bpm, LVH  Assessment and Plan:  1. Symptomatic mobitz II heart block Normal pacemaker function See Pace Art report Blunted histogram noted.  May be partially responsible  for fatigue. Rate response is turned on today. I also offered to reduce his toprol to 25mg  daily.  He states "I will be seeing Dr Burt Knack tomorrow, I will ask him about that"  2. CAD No ischemic symptoms No changes  3. HTN Stable No change required today  [facility] Return to see EP PA in a year  Thompson Grayer MD, [facility] 04/04/2017 12:34 PM

## 2017-04-04 NOTE — Patient Instructions (Addendum)
Medication Instructions:  Your physician recommends that you continue on your current medications as directed. Please refer to the Current Medication list given to you today.  Labwork: None ordered.  Testing/Procedures: None ordered.  Follow-Up: Your physician wants you to follow-up in: one year with Tommye Standard, PA.   You will receive a reminder letter in the mail two months in advance. If you don't receive a letter, please call our office to schedule the follow-up appointment.  Remote monitoring is used to monitor your Pacemaker from home. This monitoring reduces the number of office visits required to check your device to one time per year. It allows [place] to keep an eye on the functioning of your device to ensure it is working properly. You are scheduled for a device check from home on 04/16/2017. You may send your transmission at any time that day. If you have a wireless device, the transmission will be sent automatically. After your physician reviews your transmission, you will receive a postcard with your next transmission date.  Any Other Special Instructions Will Be Listed Below (If Applicable).  If you need a refill on your cardiac medications before your next appointment, please call your pharmacy.

## 2017-04-05 ENCOUNTER — Encounter: Payer: Self-pay | Admitting: Cardiovascular Disease

## 2017-04-05 ENCOUNTER — Ambulatory Visit (INDEPENDENT_AMBULATORY_CARE_PROVIDER_SITE_OTHER): Payer: Medicare Other | Admitting: Cardiovascular Disease

## 2017-04-05 VITALS — BP 150/86 | HR 81 | Ht 71.0 in | Wt 168.8 lb

## 2017-04-05 DIAGNOSIS — N184 Chronic kidney disease, stage 4 (severe): Secondary | ICD-10-CM

## 2017-04-05 DIAGNOSIS — I2510 Atherosclerotic heart disease of native coronary artery without angina pectoris: Secondary | ICD-10-CM | POA: Diagnosis not present

## 2017-04-05 DIAGNOSIS — E785 Hyperlipidemia, unspecified: Secondary | ICD-10-CM

## 2017-04-05 DIAGNOSIS — I10 Essential (primary) hypertension: Secondary | ICD-10-CM

## 2017-04-05 MED ORDER — METOPROLOL SUCCINATE ER 25 MG PO TB24: 25.0000 mg | ORAL_TABLET | Freq: Every day | ORAL | 3 refills | Status: DC

## 2017-04-05 MED ORDER — AMLODIPINE BESYLATE 2.5 MG PO TABS: 2.5000 mg | ORAL_TABLET | Freq: Every day | ORAL | 0 refills | Status: DC

## 2017-04-05 MED ORDER — AMLODIPINE BESYLATE 2.5 MG PO TABS: 2.5000 mg | ORAL_TABLET | Freq: Every day | ORAL | 3 refills | Status: DC

## 2017-04-05 NOTE — Progress Notes (Signed)
Cardiology Office Note Date:  04/05/2017   ID:  [name], DOB June 09, 1926, MRN [id number]  PCP:  [name], MD  Cardiologist:  [name], MD    Chief Complaint  Patient presents with  . Follow-up    CAD     History of Present Illness: [name] is a 84 y.o. male who presents for  follow-up evaluation.  He underwent multivessel CABG in 2013 after presenting with non-ST elevation MI. He had previously undergone coronary stenting dating back to 2001. He was found to have severe three-vessel obstructive disease. Left ventricular function was normal. He was bypassed with the LIMA to LAD, vein graft obtuse marginal, and vein graft to PDA. He also required permanent pacing in 2014 after presenting with second degree type II AV block. Cardiac catheterization was performed in 2015 after an abnormal nuclear scan.  This demonstrated continued patency of the LIMA to LAD graft and early graft failure of the saphenous vein grafts. The patient had collateral filling of the RCA distribution and moderate stenosis of the native left circumflex. Medical therapy was recommended. Comorbid medical conditions include chronic kidney disease and HTN.  He complains of fatigue. He saw Dr Rayann Heman yesterday and some adjustments were made with his pacemaker, I think rate-responsiveness changes. He feels a little better today. No chest pain or shortness of breath, but has exercise intolerance and fatigue. He's not sleeping well - often wakes up in the middle of the night and can't go back to sleep. Denies orthopnea or PND. He does have some swelling in his feet.   Past Medical History:  Diagnosis Date  . Acute superficial venous thrombosis of lower extremity    a. RLE after CABG, neg dopp for DVT.  [name] Allergy   . Atrial fibrillation ([place])    a. Post-op from CABG, on amiodarone temporarily, d/c'd 12/2011  . CAD (coronary artery disease)    a. S/P stenting to mid RCA, prox PDA 06/1999. b.  NSTEMI/CABG x 3 in 10/2011 with LIMA to LAD, SVG to PDA, and SVG to OM1.   . Chronic UTI    a. Followed by Dr. Risa Grill - colonized/asymptomatic - not on abx  . CKD (chronic kidney disease), stage III (HCC)    a. stable with a creatinine around 1.9-2.0 followed by nephrology.  . Dyslipidemia   . GERD (gastroesophageal reflux disease)   . Hypertension    well-controlled.  . Myocardial infarction ([place])   . Neck injury    a. C3-C4 and C4-C5 foraminal narrowing, severe  . Prostate cancer ([place])    a. 2001 s/p TURP.  . Pulmonary nodule    a. felt to be noncancerous.  Status post followup CT scan 4 mm and stable.  . Renal artery stenosis ([place])    a. 50% by cath 2001  . 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 cardia catherization  07-07-99  . CARDIAC SURGERY  10/18/12   open heart surgery  . CATARACT EXTRACTION W/ INTRAOCULAR LENS  IMPLANT, BILATERAL  3/205, 06/2013   [facility]  . COLONOSCOPY  04/12/07  . CORONARY ARTERY BYPASS GRAFT  10/19/2011   Procedure: CORONARY ARTERY BYPASS GRAFTING (CABG);  Surgeon: Gaye Pollack, MD;  Location: [facility];  Service: Open Heart Surgery;  Laterality: N/A;  times three using Left Internal Mammary Artery and Right Greater Saphenouse Vein Graft harvested Endoscopically  .  edg  07-17-1994  . FLEXIBLE SIGMOIDOSCOPY  11-03-1997  . LEFT HEART CATHETERIZATION WITH CORONARY ANGIOGRAM N/A 10/16/2011   Procedure: LEFT HEART CATHETERIZATION WITH CORONARY ANGIOGRAM;  Surgeon: Peter M Martinique, MD;  Location[facility] CATH LAB;  Service: Cardiovascular;  Laterality: N/A;  . LEFT HEART CATHETERIZATION WITH CORONARY/GRAFT ANGIOGRAM N/A 03/11/2013   Procedure: LEFT HEART CATHETERIZATION WITH [name];  Surgeon: Blane Ohara, MD;  Location: [facility] CATH LAB;  Service: Cardiovascular;  Laterality: N/A;  . lumbar spinal disk and neck fusion surgery    . PACEMAKER INSERTION  03/28/12     PPM implanted for mobitz II AV block  . peripheral vascular catherization  11-24-03  . PERMANENT PACEMAKER INSERTION N/A 03/28/2012   Procedure: PERMANENT PACEMAKER INSERTION;  Surgeon: Thompson Grayer, MD;  Location: [facility] CATH LAB;  Service: Cardiovascular;  Laterality: N/A;  . PROSTATECTOMY    . renal circulation  10-01-03  . s/p ptca    . stents     X 2  . stress cardiolite  05-04-05   spring 09-negative except for apical thinning, EF 68%    Current Outpatient Medications  Medication Sig Dispense Refill  . acetaminophen (TYLENOL) 500 MG tablet Take 500 mg by mouth every 6 (six) hours as needed.    [name] aspirin EC 81 MG tablet Take 1 tablet (81 mg total) by mouth daily.    . carboxymethylcellulose (REFRESH PLUS) 0.5 % SOLN Place 1 drop into both eyes daily as needed (dry eyes). 30 mL 1  . DiphenhydrAMINE HCl, Sleep, (ZZZQUIL PO) Take 1 Dose by mouth at bedtime as needed (sleep).    . ergocalciferol (VITAMIN D2) 50000 UNITS capsule Take 50,000 Units by mouth every 30 (thirty) days. 15 th of each month    . fluticasone (FLONASE) 50 MCG/ACT nasal spray Place 2 sprays into the nose 2 (two) times daily.    . folic acid (FOLVITE) 1 MG tablet TAKE 1 TABLET DAILY 90 tablet 3  . ipratropium (ATROVENT) 0.06 % nasal spray Place 2 sprays into the nose 2 (two) times daily.  5  . metoprolol succinate (TOPROL-XL) 25 MG 24 hr tablet Take 1 tablet (25 mg total) by mouth at bedtime. 90 tablet 3  . Multiple Vitamins-Minerals (PRESERVISION AREDS PO) Take 1 tablet by mouth daily.     . nitroGLYCERIN (NITROSTAT) 0.4 MG SL tablet DISSOLVE 1 TABLET UNDER THE TONGUE EVERY 5 MINUTES AS NEEDED FOR CHEST PAIN (MAX OF 3 TABLETS) 100 tablet 0  . simvastatin (ZOCOR) 20 MG tablet TAKE 1 TABLET DAILY (ANNUAL APPOINTMENT DUE IN [place]. MUST SEE DOCTOR FOR REFILLS) 90 tablet 3  . vitamin B-12 (CYANOCOBALAMIN) 500 MCG tablet Take 500 mcg by mouth daily. Take one tablet every 3rd day    . vitamin C (ASCORBIC ACID) 500 MG tablet  Take 1,000 mg by mouth daily.     [name] amLODipine (NORVASC) 2.5 MG tablet Take 1 tablet (2.5 mg total) by mouth daily. 90 tablet 3   No current facility-administered medications for this visit.     Allergies:   Amoxicillin; Aspirin; Atarax [hydroxyzine hcl]; Cephalexin; Ciprofloxacin; Clindamycin; Clobetasol; Codeine; Fluarix [flu virus vaccine]; Haemophilus influenzae; Hydrocodone; Hydrocodone-acetaminophen; Hydroxyzine; Latex; Macrobid [nitrofurantoin macrocrystal]; Niacin; Niacin-lovastatin er; Niacin-lovastatin er; Nitrofurantoin; Trimox [amoxicillin trihydrate]; Vibramycin  [doxycycline]; and Bactrim [sulfamethoxazole-trimethoprim]   Social History:  The patient  reports that he has never smoked. He has never used smokeless tobacco. He reports that he drinks alcohol. He reports that he does not use drugs.   Family History:  The patient's family history includes Arthritis in his mother; Breast cancer in his other; Cancer in his brother; Coronary artery disease in his father; Heart disease in his brother; Nephritis in his brother; Other in his brother and mother; Prostate cancer in his brother.    ROS:  Please see the history of present illness.  Otherwise, review of systems is positive for foot swelling, balance problems.  All other systems are reviewed and negative.    PHYSICAL EXAM: VS:  BP (!) 150/86   Pulse 81   Ht 5\' 11"  (1.803 m)   Wt 168 lb 12.8 oz (76.6 kg)   SpO2 96%   BMI 23.54 kg/m  , BMI Body mass index is 23.54 kg/m. GEN: Well nourished, well developed, in no acute distress the patient appears younger than his stated age. HEENT: normal  Neck: no JVD, no masses. No carotid bruits Cardiac: RRR without murmur or gallop                Respiratory:  clear to auscultation bilaterally, normal work of breathing GI: soft, nontender, nondistended, + BS MS: no deformity or atrophy  Ext: no pretibial edema, pedal pulses 2+= bilaterally. 1+ pedal edema is present bilaterally Skin:  warm and dry, no rash Neuro:  Strength and sensation are intact Psych: euthymic mood, full affect  EKG:  EKG is not ordered today.  Recent Labs: 09/04/2016: ALT 11; BUN 38; Creatinine, Ser 2.15; Hemoglobin 13.9; Platelets 250.0; Potassium 4.8; Sodium 140; TSH 2.58   Lipid Panel     Component Value Date/Time   CHOL 122 09/04/2016 1123   TRIG 59.0 09/04/2016 1123   TRIG 107 01/16/2006 1338   HDL 40.40 09/04/2016 1123   CHOLHDL 3 09/04/2016 1123   VLDL 11.8 09/04/2016 1123   LDLCALC 70 09/04/2016 1123      Wt Readings from Last 3 Encounters:  04/05/17 168 lb 12.8 oz (76.6 kg)  04/04/17 169 lb (76.7 kg)  10/25/16 167 lb (75.8 kg)     Cardiac Studies Reviewed: 06-15-2016 Echo: Study Conclusions  - Left ventricle: The cavity size was normal. Wall thickness was   increased in a pattern of mild LVH. Systolic function was normal.   The estimated ejection fraction was in the range of 55% to 60%.   Wall motion was normal; there were no regional wall motion   abnormalities. Left ventricular diastolic function parameters   were normal. - Aortic valve: There was trivial regurgitation. - Mitral valve: Calcified annulus. Mildly thickened leaflets .   There was mild regurgitation. - Left atrium: The atrium was mildly dilated. - Atrial septum: No defect or patent foramen ovale was identified. - Pulmonary arteries: PA peak pressure: 31 mm Hg (S).   ASSESSMENT AND PLAN: 1.  CAD, native vessel, without angina: last cath findings reviewed. Medical therapy reviewed.   2. Hyperlipidemia: treated with simvastatin.   3. HTN with CKD IV: I reviewed his home blood pressure readings.  They are running too high.  About 50% of his readings are greater than 140 and there are several systolic readings greater than 160.  I recommended that he reduce his metoprolol succinate to 25 mg once daily which might help his energy level a little.  I recommended that he add amlodipine 2.5 mg daily.  He  previously had problems with fatigue and hypotension on 5 mg of amlodipine but I am hopeful with reducing his beta-blocker that he will be able to tolerate 2.5 mg.  4.  AV block  status post permanent pacemaker placement: The patient is followed by Dr. Rayann Heman.  He requests to have his office visits with a 2 of [place] offset by 6 months.  I will see the patient back in 6 months and then plan on yearly follow-up moving forward.  Current medicines are reviewed with the patient today.  The patient does not have concerns regarding medicines.  Labs/ tests ordered today include:  No orders of the defined types were placed in this encounter.   Disposition:   FU 6 months  Signed, [name], MD  04/05/2017 2:11 PM    [facility] [address], [place], [place]  [address] Phone: [phone]; Fax: [phone]

## 2017-04-05 NOTE — Patient Instructions (Signed)
Medication Instructions:  1) DECREASE TOPROL to 25 mg once daily at bedtime 2) START AMLODIPINE 2.5 mg daily  Labwork: None  Testing/Procedures: None  Follow-Up: Your provider wants you to follow-up in: 6 months with Dr. Burt Knack. You will receive a reminder letter in the mail two months in advance. If you don't receive a letter, please call our office to schedule the follow-up appointment.    Any Other Special Instructions Will Be Listed Below (If Applicable).     If you need a refill on your cardiac medications before your next appointment, please call your pharmacy.

## 2017-04-16 ENCOUNTER — Ambulatory Visit (INDEPENDENT_AMBULATORY_CARE_PROVIDER_SITE_OTHER): Payer: Medicare Other | Admitting: *Deleted

## 2017-04-16 ENCOUNTER — Encounter: Payer: Self-pay | Admitting: Gastroenterology

## 2017-04-16 DIAGNOSIS — R001 Bradycardia, unspecified: Secondary | ICD-10-CM

## 2017-04-16 LAB — CUP PACEART INCLINIC DEVICE CHECK
Battery Impedance: 280 Ohm
Battery Voltage: 2.78 V
Brady Statistic AP VP Percent: 0 %
Brady Statistic AP VS Percent: 35 %
Brady Statistic AS VP Percent: 0 %
Implantable Lead Implant Date: 20140321
Implantable Lead Model: 5076
Implantable Lead Model: 5092
Lead Channel Impedance Value: 422 Ohm
Lead Channel Impedance Value: 575 Ohm
Lead Channel Pacing Threshold Amplitude: 0.75 V
Lead Channel Pacing Threshold Pulse Width: 0.4 ms
Lead Channel Sensing Intrinsic Amplitude: 4 mV
MDC IDC LEAD IMPLANT DT: 20140321
MDC IDC LEAD LOCATION: 753859
MDC IDC LEAD LOCATION: 753860
MDC IDC MSMT BATTERY REMAINING LONGEVITY: 118 mo
MDC IDC MSMT LEADCHNL RV PACING THRESHOLD AMPLITUDE: 0.75 V
MDC IDC MSMT LEADCHNL RV PACING THRESHOLD PULSEWIDTH: 0.4 ms
MDC IDC MSMT LEADCHNL RV SENSING INTR AMPL: 22.4 mV
MDC IDC PG IMPLANT DT: 20140321
MDC IDC SESS DTM: 20190328160535
MDC IDC SET LEADCHNL RA PACING AMPLITUDE: 2 V
MDC IDC SET LEADCHNL RV PACING AMPLITUDE: 2.5 V
MDC IDC SET LEADCHNL RV PACING PULSEWIDTH: 0.4 ms
MDC IDC SET LEADCHNL RV SENSING SENSITIVITY: 5.6 mV
MDC IDC STAT BRADY AS VS PERCENT: 65 %

## 2017-04-16 NOTE — Progress Notes (Signed)
Remote pacemaker transmission.   

## 2017-04-18 ENCOUNTER — Encounter: Payer: Self-pay | Admitting: Cardiology

## 2017-04-22 ENCOUNTER — Ambulatory Visit (INDEPENDENT_AMBULATORY_CARE_PROVIDER_SITE_OTHER): Payer: Medicare Other | Admitting: Internal Medicine

## 2017-04-22 ENCOUNTER — Encounter: Payer: Self-pay | Admitting: Internal Medicine

## 2017-04-22 DIAGNOSIS — M79672 Pain in left foot: Secondary | ICD-10-CM | POA: Diagnosis not present

## 2017-04-22 DIAGNOSIS — M79671 Pain in right foot: Secondary | ICD-10-CM | POA: Insufficient documentation

## 2017-04-22 DIAGNOSIS — N183 Chronic kidney disease, stage 3 unspecified: Secondary | ICD-10-CM

## 2017-04-22 DIAGNOSIS — I2510 Atherosclerotic heart disease of native coronary artery without angina pectoris: Secondary | ICD-10-CM

## 2017-04-22 MED ORDER — METHYLPREDNISOLONE 4 MG PO TBPK: ORAL_TABLET | ORAL | 0 refills | Status: DC

## 2017-04-22 MED ORDER — COLCHICINE 0.6 MG PO TABS: ORAL_TABLET | ORAL | 1 refills | Status: DC

## 2017-04-22 NOTE — Assessment & Plan Note (Signed)
F/u w/Dr Justin Mend Will avoid NSAIDs for gout

## 2017-04-22 NOTE — Assessment & Plan Note (Signed)
Medrol dose-pack Colchicine prn

## 2017-04-22 NOTE — Patient Instructions (Signed)

## 2017-04-22 NOTE — Progress Notes (Signed)
Subjective:  Patient ID: [name], male    DOB: 03/19/1926  Age: 84 y.o. MRN: [id number]  CC: No chief complaint on file.   HPI [name] presents for B foot pain L>R starting 7 am  F/u HTN, CRF C/o multiple med allergies  Outpatient Medications Prior to Visit  Medication Sig Dispense Refill  . acetaminophen (TYLENOL) 500 MG tablet Take 500 mg by mouth every 6 (six) hours as needed.    [name] amLODipine (NORVASC) 2.5 MG tablet Take 1 tablet (2.5 mg total) by mouth daily. 90 tablet 3  . aspirin EC 81 MG tablet Take 1 tablet (81 mg total) by mouth daily.    . carboxymethylcellulose (REFRESH PLUS) 0.5 % SOLN Place 1 drop into both eyes daily as needed (dry eyes). 30 mL 1  . DiphenhydrAMINE HCl, Sleep, (ZZZQUIL PO) Take 1 Dose by mouth at bedtime as needed (sleep).    . ergocalciferol (VITAMIN D2) 50000 UNITS capsule Take 50,000 Units by mouth every 30 (thirty) days. 15 th of each month    . fluticasone (FLONASE) 50 MCG/ACT nasal spray Place 2 sprays into the nose 2 (two) times daily.    . folic acid (FOLVITE) 1 MG tablet TAKE 1 TABLET DAILY 90 tablet 3  . metoprolol succinate (TOPROL-XL) 25 MG 24 hr tablet Take 1 tablet (25 mg total) by mouth at bedtime. 90 tablet 3  . Multiple Vitamins-Minerals (PRESERVISION AREDS PO) Take 1 tablet by mouth daily.     . nitroGLYCERIN (NITROSTAT) 0.4 MG SL tablet DISSOLVE 1 TABLET UNDER THE TONGUE EVERY 5 MINUTES AS NEEDED FOR CHEST PAIN (MAX OF 3 TABLETS) 100 tablet 0  . simvastatin (ZOCOR) 20 MG tablet TAKE 1 TABLET DAILY (ANNUAL APPOINTMENT DUE IN [place]. MUST SEE DOCTOR FOR REFILLS) 90 tablet 3  . vitamin B-12 (CYANOCOBALAMIN) 500 MCG tablet Take 500 mcg by mouth daily. Take one tablet every 3rd day    . vitamin C (ASCORBIC ACID) 500 MG tablet Take 1,000 mg by mouth daily.     [name] ipratropium (ATROVENT) 0.06 % nasal spray Place 2 sprays into the nose 2 (two) times daily.  5   No facility-administered medications prior to visit.      ROS Review of Systems  Constitutional: Negative for appetite change, fatigue and unexpected weight change.  HENT: Negative for congestion, nosebleeds, sneezing, sore throat and trouble swallowing.   Eyes: Negative for itching and visual disturbance.  Respiratory: Negative for cough.   Cardiovascular: Negative for chest pain, palpitations and leg swelling.  Gastrointestinal: Negative for abdominal distention, blood in stool, diarrhea and nausea.  Genitourinary: Negative for frequency and hematuria.  Musculoskeletal: Positive for arthralgias. Negative for back pain, gait problem, joint swelling and neck pain.  Skin: Negative for rash.  Neurological: Negative for dizziness, tremors, speech difficulty and weakness.  Psychiatric/Behavioral: Negative for agitation, dysphoric mood and sleep disturbance. The patient is not nervous/anxious.     Objective:  BP (!) 144/68 (BP Location: Left Arm, Patient Position: Sitting, Cuff Size: Large)   Pulse 79   Temp 98.7 F (37.1 C)   Ht 5\' 11"  (1.803 m)   Wt 172 lb (78 kg)   SpO2 98%   BMI 23.99 kg/m   BP Readings from Last 3 Encounters:  04/22/17 (!) 144/68  04/05/17 (!) 150/86  04/04/17 124/72    Wt Readings from Last 3 Encounters:  04/22/17 172 lb (78 kg)  04/05/17 168 lb 12.8 oz (76.6 kg)  04/04/17 169 lb (76.7  kg)    Physical Exam  Constitutional: He is oriented to person, place, and time. He appears well-developed. No distress.  NAD  HENT:  Mouth/Throat: Oropharynx is clear and moist.  Eyes: Pupils are equal, round, and reactive to light. Conjunctivae are normal.  Neck: Normal range of motion. No JVD present. No thyromegaly present.  Cardiovascular: Normal rate, regular rhythm, normal heart sounds and intact distal pulses. Exam reveals no gallop and no friction rub.  No murmur heard. Pulmonary/Chest: Effort normal and breath sounds normal. No respiratory distress. He has no wheezes. He has no rales. He exhibits no tenderness.   Abdominal: Soft. Bowel sounds are normal. He exhibits no distension and no mass. There is no tenderness. There is no rebound and no guarding.  Musculoskeletal: Normal range of motion. He exhibits no edema or tenderness.  Lymphadenopathy:    He has no cervical adenopathy.  Neurological: He is alert and oriented to person, place, and time. He has normal reflexes. No cranial nerve deficit. He exhibits normal muscle tone. He displays a negative Romberg sign. Coordination and gait normal.  Skin: Skin is warm and dry. No rash noted.  Psychiatric: He has a normal mood and affect. His behavior is normal. Judgment and thought content normal.  trace edema B ankles L>R 1st MTP tender  Lab Results  Component Value Date   WBC 8.4 09/04/2016   HGB 13.9 09/04/2016   HCT 42.8 09/04/2016   PLT 250.0 09/04/2016   GLUCOSE 97 09/04/2016   CHOL 122 09/04/2016   TRIG 59.0 09/04/2016   HDL 40.40 09/04/2016   LDLCALC 70 09/04/2016   ALT 11 09/04/2016   AST 14 09/04/2016   NA 140 09/04/2016   K 4.8 09/04/2016   CL 105 09/04/2016   CREATININE 2.15 (H) 09/04/2016   BUN 38 (H) 09/04/2016   CO2 27 09/04/2016   TSH 2.58 09/04/2016   PSA 0.44 07/18/2007   INR 1.2 (H) 03/04/2013    Ct Maxillofacial Wo Contrast  Result Date: 03/22/2017 CLINICAL DATA:  Sinus drainage for several years deviated septum. Chronic sinusitis. EXAM: CT MAXILLOFACIAL WITHOUT CONTRAST TECHNIQUE: Multidetector CT images of the paranasal sinuses were obtained using the standard protocol without intravenous contrast. COMPARISON:  07/14/2015.  07/25/2010. FINDINGS: Paranasal sinuses: Frontal: Clear except for mild mucosal thickening at the frontal ethmoid junction regions. Ethmoid: Scattered opacified ethmoid air cells bilaterally, most extensive in the anterior ethmoid region on the left. Maxillary: Mild mucosal thickening along the right maxillary sinus floor. No advanced inflammatory change. No layering fluid. Sphenoid: No mucosal  inflammatory changes. Retention cyst along the lateral superior corner of the left division of the sphenoid sinus. Right ostiomeatal unit: Infundibulum is affected by mucosal thickening. No unfavorable variant identified. Left ostiomeatal unit: Infundibulum widely patent. No unfavorable variant. Nasal passages: Patent. Nasal septum bows 6 mm towards the right with a right septal spur. Anatomy: There is pneumatization superior to the anterior ethmoid notches. This includes some opacified air cells on the left Symmetric and intact olfactory grooves and fovea ethmoidalis, Keros III (8-33mm) Sellar sphenoid pneumatization pattern. No dehiscence of carotid or optic canals. No onodi cell. Other: None IMPRESSION: Mucosal inflammatory changes at the frontal ethmoid junction regions and ethmoid regions, left more than right. Mild mucosal thickening of the right maxillary sinus. No advanced sinusitis or fluid levels. Nasal septal bowing towards the right of 6 mm with a right septal spur. Electronically Signed   By: Nelson Chimes M.D.   On: 03/22/2017 15:40  Assessment & Plan:   There are no diagnoses linked to this encounter. I have discontinued Yvonne Kendall. [place]'s ipratropium. I am also having him maintain his ergocalciferol, vitamin C, carboxymethylcellulose, (DiphenhydrAMINE HCl, Sleep, (ZZZQUIL PO)), vitamin B-12, Multiple Vitamins-Minerals (PRESERVISION AREDS PO), aspirin EC, nitroGLYCERIN, acetaminophen, folic acid, simvastatin, fluticasone, metoprolol succinate, and amLODipine.  No orders of the defined types were placed in this encounter.    Follow-up: No follow-ups on file.  Walker Kehr, MD

## 2017-04-22 NOTE — Assessment & Plan Note (Signed)
Hold simvastatin x 1-2 d while on Colchicine

## 2017-04-23 ENCOUNTER — Ambulatory Visit: Payer: Self-pay

## 2017-04-23 NOTE — Telephone Encounter (Signed)
Stop colchicine.    Take prednisone if pain recurs.

## 2017-04-23 NOTE — Telephone Encounter (Signed)
Pt. Reports he took one dose of Colchicine yesterday and his foot "felt better." Last night he woke up itching around his shoulders and neck. No rash noted. Wants to know if he should stop the colchicine. He has not started the Prednisone yet. "My foot feels a lot better." Wants to know if he should take the Prednisone as well. Please advise pt. Answer Assessment - Initial Assessment Questions 1. SYMPTOMS: "Do you have any symptoms?"     Itching around his shoulders and neck after taking Colchicine. No rash that he can see. 2. SEVERITY: If symptoms are present, ask "Are they mild, moderate or severe?"     Moderate itching - it woke him up last night.  Protocols used: MEDICATION QUESTION CALL-A-AH

## 2017-04-23 NOTE — Telephone Encounter (Signed)
Patient was seen by Dr Alain Marion yesterday.

## 2017-04-23 NOTE — Telephone Encounter (Signed)
Spoke with pt to inform.  

## 2017-05-07 LAB — CUP PACEART REMOTE DEVICE CHECK
Battery Impedance: 306 Ohm
Battery Voltage: 2.78 V
Brady Statistic AP VS Percent: 50 %
Implantable Lead Implant Date: 20140321
Implantable Lead Location: 753859
Implantable Lead Model: 5076
Implantable Lead Model: 5092
Lead Channel Impedance Value: 429 Ohm
Lead Channel Impedance Value: 626 Ohm
Lead Channel Pacing Threshold Amplitude: 0.875 V
Lead Channel Pacing Threshold Pulse Width: 0.4 ms
Lead Channel Sensing Intrinsic Amplitude: 2.8 mV
Lead Channel Setting Pacing Amplitude: 2 V
Lead Channel Setting Pacing Amplitude: 2.5 V
Lead Channel Setting Pacing Pulse Width: 0.4 ms
Lead Channel Setting Sensing Sensitivity: 5.6 mV
MDC IDC LEAD IMPLANT DT: 20140321
MDC IDC LEAD LOCATION: 753860
MDC IDC MSMT BATTERY REMAINING LONGEVITY: 111 mo
MDC IDC MSMT LEADCHNL RA PACING THRESHOLD AMPLITUDE: 0.625 V
MDC IDC MSMT LEADCHNL RA PACING THRESHOLD PULSEWIDTH: 0.4 ms
MDC IDC MSMT LEADCHNL RV SENSING INTR AMPL: 16 mV
MDC IDC PG IMPLANT DT: 20140321
MDC IDC SESS DTM: 20190409131642
MDC IDC STAT BRADY AP VP PERCENT: 0 %
MDC IDC STAT BRADY AS VP PERCENT: 0 %
MDC IDC STAT BRADY AS VS PERCENT: 49 %

## 2017-05-16 ENCOUNTER — Encounter: Payer: Self-pay | Admitting: *Deleted

## 2017-05-16 ENCOUNTER — Telehealth: Payer: Self-pay | Admitting: Cardiovascular Disease

## 2017-05-16 NOTE — Telephone Encounter (Signed)
Pt reports increased fatigue, poor endurance x 3 weeks. Saw cardiologist 04/04/17 who adjusted pacer; 'rate responsiveness changes.'  MD decreased Toprol from 25mg  BID to QD and started on Amlodipine 2.5mg  QD. States fatigue, endurance unchanged. B/P this am 103/67  HR 68, O2 sat 97% on RA. States "tender at temples and generalized body aches." Denies any dizziness, CP. Mild edema ankles and hands.  Pt instructed to alert cardiologist to symptoms.  Requesting an appt with Dr. Quay Burow also. Appt made for tomorrow. Instructed pt to call back if symptoms worsen, dizziness, CP, SOB present.  Reason for Disposition . [1] MODERATE weakness (i.e., interferes with work, school, normal activities) AND [2] persists > 3 days  Answer Assessment - Initial Assessment Questions 1. DESCRIPTION: "Describe how you are feeling."     Weak, fatigued, poor endurance 2. SEVERITY: "How bad is it?"  "Can you stand and walk?"   - MILD - Feels weak or tired, but does not interfere with work, school or normal activities   - [place] to stand and walk; weakness interferes with work, school, or normal activities   - SEVERE - Unable to stand or walk     Moderate 3. ONSET:  "When did the weakness begin?"     "About 3 weeks ago" 4. CAUSE: "What do you think is causing the weakness?"     Unsure; saw cardiologist who adjusted pacemaker 5. MEDICINES: "Have you recently started a new medicine or had a change in the amount of a medicine?"    B/P meds changed 6. OTHER SYMPTOMS: "Do you have any other symptoms?" (e.g., chest pain, fever, cough, SOB, vomiting, diarrhea, bleeding)     "Temples tender to touch, body aches."  Protocols used: WEAKNESS (GENERALIZED) AND FATIGUE-A-AH

## 2017-05-16 NOTE — Telephone Encounter (Signed)
NEW MESSAGE   Patient calling with concerns of swelling and being tired.   Pt c/o swelling: STAT is pt has developed SOB within 24 hours  1) How much weight have you gained and in what time span? N/A  2) If swelling, where is the swelling located? ANKLES AND FEET, HANDS  3) Are you currently taking a fluid pill? NO  4) Are you currently SOB? NO  5) Do you have a log of your daily weights (if so, list)? 167-->171  6) Have you gained 3 pounds in a day or 5 pounds in a week? N/A  7) Have you traveled recently? NO  BP 103/47 this morning, HR 68

## 2017-05-16 NOTE — Progress Notes (Signed)
Subjective:    Patient ID: [name], male    DOB: March 26, 1926, 84 y.o.   MRN: [id number]  HPI The patient is here for an acute visit.   Blood pressure: At least three weeks ago he saw dr cooper and dr allred.  His BP was running high at home.  He was started on amlodipine.  Dr Rayann Heman tweaked his pacemaker.  His bp after that has continued to be variable.  Since he started the new medication his BP has imrpoved - 143/75, 107/53-blood pressures ranging between these 2 numbers.  He did contact cardiology and the plan was for him to check his BP over the weekend at the same time and his weight and he will call cardiology on Monday.  He is having lower leg swelling b/l for the past two weeks intermittently.  For two weeks he has had some mild swelling in his fingers.    In the past few weeks his temples have been sensitive.   Problems with throat:  He sometimes has trouble swallowing, the throat feels raw when he yawns.  He does have intermittent hoarseness.  He does have some postnasal drip.  He denies any GERD.  When he first wakes up his whole body aches, but it is very transient-it only lasts minutes.  He feels more fatigued.  He is not sleeping.  Sometimes he takes tylenol - 500 mg at night.    Fatigue, decreased endurance:  He is sometimes very tired in the morning when washing up.  He sometimes thinks he has a good night sleep but is exhausted.  For the past two weeks he has not gotten more than 4 hours of sleep.  He wakes up to go to the bathroom in the middle of the night and has difficulty falling aslepp after.    He has decreased his cardio recently because he is so fatigued.  His sister is on hospice and there has been some increased stress and anxiety.  He does not feel that he needs to do anything for his anxiety.  Medications and allergies reviewed with patient and updated if appropriate.  Patient Active Problem List   Diagnosis Date Noted  . Foot pain, bilateral  04/22/2017  . Trochanteric bursitis 01/10/2017  . Allergic reaction 11/14/2016  . Internal hemorrhoids 08/15/2016  . Fecal incontinence 06/26/2016  . Loose stools 02/06/2016  . Dysphagia 02/06/2016  . Cephalalgia 07/14/2015  . Constipation 03/01/2015  . Claudication of left lower extremity ([place]) 03/02/2014  . CHB (complete heart block) ([place]) 05/03/2012  . Postoperative atrial fibrillation ([place]) 05/03/2012  . Disorder of kidney and ureter 04/10/2012  . Pacemaker 04/10/2012  . Solitary pulmonary nodule 04/10/2012  . 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 Symptomatic bradycardia 03/26/2012  . Second degree atrioventricular block 03/26/2012  . Coronary atherosclerosis 11/27/2011  . Presence of aortocoronary bypass graft 10/24/2011  . Pulmonary nodule   . CAD - CABG Oct 2013   . Chronic renal impairment, stage 3 (moderate) (HCC)   . Venous insufficiency of leg 06/05/2010  . PERSISTENT DISORDER INITIATING/MAINTAINING SLEEP 11/09/2009  . SHOULDER PAIN, RIGHT, CHRONIC 10/14/2009  . B12 nutritional deficiency 08/23/2009  . Peripheral neuropathy 03/21/2009  . Hereditary and idiopathic peripheral neuropathy 03/21/2009  . DEGENERATIVE DISC DISEASE, CERVICAL SPINE, W/RADICULOPATHY 09/21/2008  . Dyslipidemia 01/17/2007  . Essential hypertension 07/20/2006  . Prostate cancer ([place]) 07/20/2006    Current Outpatient Medications on File Prior to Visit  Medication Sig  Dispense Refill  . acetaminophen (TYLENOL) 500 MG tablet Take 500 mg by mouth every 6 (six) hours as needed.    [name] aspirin EC 81 MG tablet Take 1 tablet (81 mg total) by mouth daily.    . carboxymethylcellulose (REFRESH PLUS) 0.5 % SOLN Place 1 drop into both eyes daily as needed (dry eyes). 30 mL 1  . DiphenhydrAMINE HCl, Sleep, (ZZZQUIL PO) Take 1 Dose by mouth at bedtime as needed (sleep).    . ergocalciferol (VITAMIN D2) 50000 UNITS capsule Take 50,000 Units by mouth every 30 (thirty) days. 15 th of each  month    . fluticasone (FLONASE) 50 MCG/ACT nasal spray Place 2 sprays into the nose 2 (two) times daily.    . folic acid (FOLVITE) 1 MG tablet TAKE 1 TABLET DAILY 90 tablet 3  . metoprolol succinate (TOPROL-XL) 25 MG 24 hr tablet Take 1 tablet (25 mg total) by mouth at bedtime. 90 tablet 3  . Multiple Vitamins-Minerals (PRESERVISION AREDS PO) Take 1 tablet by mouth daily.     . nitroGLYCERIN (NITROSTAT) 0.4 MG SL tablet DISSOLVE 1 TABLET UNDER THE TONGUE EVERY 5 MINUTES AS NEEDED FOR CHEST PAIN (MAX OF 3 TABLETS) 100 tablet 0  . simvastatin (ZOCOR) 20 MG tablet TAKE 1 TABLET DAILY (ANNUAL APPOINTMENT DUE IN [place]. MUST SEE DOCTOR FOR REFILLS) 90 tablet 3  . vitamin B-12 (CYANOCOBALAMIN) 500 MCG tablet Take 500 mcg by mouth daily. Take one tablet every 3rd day    . vitamin C (ASCORBIC ACID) 500 MG tablet Take 1,000 mg by mouth daily.      No current facility-administered medications on file prior to visit.     Past Medical History:  Diagnosis Date  . Acute superficial venous thrombosis of lower extremity    a. RLE after CABG, neg dopp for DVT.  [name] Allergy   . Atrial fibrillation ([place])    a. Post-op from CABG, on amiodarone temporarily, d/c'd 12/2011  . CAD (coronary artery disease)    a. S/P stenting to mid RCA, prox PDA 06/1999. b. NSTEMI/CABG x 3 in 10/2011 with LIMA to LAD, SVG to PDA, and SVG to OM1.   . Chronic UTI    a. Followed by Dr. Risa Grill - colonized/asymptomatic - not on abx  . CKD (chronic kidney disease), stage III (HCC)    a. stable with a creatinine around 1.9-2.0 followed by nephrology.  . Dyslipidemia   . GERD (gastroesophageal reflux disease)   . Hypertension    well-controlled.  . Myocardial infarction ([place])   . Neck injury    a. C3-C4 and C4-C5 foraminal narrowing, severe  . Prostate cancer ([place])    a. 2001 s/p TURP.  . Pulmonary nodule    a. felt to be noncancerous.  Status post followup CT scan 4 mm and stable.  . Renal artery stenosis ([place])    a. 50% by  cath 2001  . 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 cardia catherization  07-07-99  . CARDIAC SURGERY  10/18/12   open heart surgery  . CATARACT EXTRACTION W/ INTRAOCULAR LENS  IMPLANT, BILATERAL  3/205, 06/2013   [facility]  . COLONOSCOPY  04/12/07  . CORONARY ARTERY BYPASS GRAFT  10/19/2011   Procedure: CORONARY ARTERY BYPASS GRAFTING (CABG);  Surgeon: Gaye Pollack, MD;  Location: [facility];  Service: Open Heart Surgery;  Laterality: N/A;  times three using Left  Internal Mammary Artery and Right Greater Saphenouse Vein Graft harvested Endoscopically  . edg  07-17-1994  . FLEXIBLE SIGMOIDOSCOPY  11-03-1997  . LEFT HEART CATHETERIZATION WITH CORONARY ANGIOGRAM N/A 10/16/2011   Procedure: LEFT HEART CATHETERIZATION WITH CORONARY ANGIOGRAM;  Surgeon: Peter M Martinique, MD;  Location: [facility] CATH LAB;  Service: Cardiovascular;  Laterality: N/A;  . LEFT HEART CATHETERIZATION WITH CORONARY/GRAFT ANGIOGRAM N/A 03/11/2013   Procedure: LEFT HEART CATHETERIZATION WITH [name];  Surgeon: Blane Ohara, MD;  Location: [facility] CATH LAB;  Service: Cardiovascular;  Laterality: N/A;  . lumbar spinal disk and neck fusion surgery    . PACEMAKER INSERTION  03/28/12   PPM implanted for mobitz II AV block  . peripheral vascular catherization  11-24-03  . PERMANENT PACEMAKER INSERTION N/A 03/28/2012   Procedure: PERMANENT PACEMAKER INSERTION;  Surgeon: Thompson Grayer, MD;  Location: [facility] CATH LAB;  Service: Cardiovascular;  Laterality: N/A;  . PROSTATECTOMY    . renal circulation  10-01-03  . s/p ptca    . stents     X 2  . stress cardiolite  05-04-05   spring 09-negative except for apical thinning, EF 68%    Social History   Socioeconomic History  . Marital status: Married    Spouse name: [name]  . Number of children: 2  . Years of education: Not on file  . Highest education level: Not on file  Occupational  History  . 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 Financial resource strain: Not on file  . Food insecurity:    Worry: Not on file    Inability: Not on file  . Transportation needs:    Medical: Not on file    Non-medical: Not on file  Tobacco Use  . Smoking status: Never Smoker  . Smokeless tobacco: Never Used  Substance and Sexual Activity  . Alcohol use: Yes    Comment: Rarely  . Drug use: No  . Sexual activity: Not on file  Lifestyle  . Physical activity:    Days per week: Not on file    Minutes per session: Not on file  . Stress: Not on file  Relationships  . Social connections:    Talks on phone: Not on file    Gets together: Not on file    Attends religious service: Not on file    Active member of club or organization: Not on file    Attends meetings of clubs or organizations: Not on file    Relationship status: Not on file  Other Topics Concern  . Not on file  Social History Narrative   HSG, 1 year college.  married '52 - 3 years, divorced; married '56 - 3 years divorced; married '6-12 yrs - divorced; married '75 -. 1 son '57; 1 daughter - '53; 1 grandchild.  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  Very happily married.  End of life care: yes CPR, no long term mechanical ventilation, no heroic measures.    Family History  Problem Relation Age of Onset  . Coronary artery disease Father        died @ 30  . Other Mother        cerebral hemorrhage - died @ 46  . Arthritis Mother   . Cancer Brother  Bladder  . Prostate cancer Brother   . Nephritis Brother        died @ age 70.  . Other Brother        cerebral hemorrhage - died @ 57  . Heart disease Brother   . Breast cancer Other        niece x 2  . Diabetes Neg Hx   . Colon cancer Neg Hx      Review of Systems  Constitutional: Positive for fatigue. Negative for appetite change, chills and fever.  HENT: Positive for postnasal drip, sore throat, trouble swallowing and voice change (intermittent).   Respiratory: Positive for shortness of breath (when tired only).   Cardiovascular: Positive for leg swelling. Negative for chest pain and palpitations.  Gastrointestinal:       No gerd  Psychiatric/Behavioral: Positive for sleep disturbance.       Objective:   Vitals:   05/17/17 1455  BP: 140/74  Pulse: 87  Resp: 16  Temp: 98 F (36.7 C)  SpO2: 98%   BP Readings from Last 3 Encounters:  05/17/17 140/74  04/22/17 (!) 144/68  04/05/17 (!) 150/86   Wt Readings from Last 3 Encounters:  05/17/17 170 lb (77.1 kg)  04/22/17 172 lb (78 kg)  04/05/17 168 lb 12.8 oz (76.6 kg)   Body mass index is 23.71 kg/m.   Physical Exam  Constitutional: He is oriented to person, place, and time. He appears well-developed and well-nourished. No distress.  HENT:  Head: Normocephalic and atraumatic.  Neck: No tracheal deviation present. No thyromegaly present.  Cardiovascular: Normal rate and regular rhythm.  Murmur heard. Pulmonary/Chest: Effort normal and breath sounds normal. No stridor. No respiratory distress. He has no wheezes. He has no rales.  Abdominal: Soft. He exhibits no distension. There is no tenderness.  Musculoskeletal: He exhibits edema (1+ b/l LE pitting edema in ankles).  Lymphadenopathy:    He has no cervical adenopathy.  Neurological: He is alert and oriented to person, place, and time.  Skin: Skin is warm and dry. He is not diaphoretic.  Psychiatric: He has a normal mood and affect. His behavior is normal. Judgment and thought content normal.           Assessment & Plan:    See Problem List for Assessment and Plan of chronic medical problems.

## 2017-05-16 NOTE — Telephone Encounter (Signed)
Spoke with patient in regards to swelling in his hands, ankles and feet and he has trouble removing compression stockings at night.    He recently had his BP medication adjusted and said his BP has been fluctuating between 107/53 and 147/75.  He also feels very tired on occasion.   He has an appointment with his PCP on 5/10 and will f/u with a lot of this.  I told him to check his BP daily and his weight.  He is to call us if symptoms worsen.  He denies being SOB, only tired.

## 2017-05-16 NOTE — Telephone Encounter (Signed)
If swelling is new, likely a result of starting low dose amlodipine. I would recommend stopping it. thanks

## 2017-05-16 NOTE — Telephone Encounter (Signed)
.    Answer Assessment - Initial Assessment Questions 1. DESCRIPTION: "Describe how you are feeling."     *No Answer* 2. SEVERITY: "How bad is it?"  "Can you stand and walk?"   - MILD - Feels weak or tired, but does not interfere with work, school or normal activities   - [place] to stand and walk; weakness interferes with work, school, or normal activities   - SEVERE - Unable to stand or walk     *No Answer* 3. ONSET:  "When did the weakness begin?"     *No Answer* 4. CAUSE: "What do you think is causing the weakness?"     *No Answer* 5. MEDICINES: "Have you recently started a new medicine or had a change in the amount of a medicine?"     *No Answer* 6. OTHER SYMPTOMS: "Do you have any other symptoms?" (e.g., chest pain, fever, cough, SOB, vomiting, diarrhea, bleeding)     *No Answer* 7. PREGNANCY: "Is there any chance you are pregnant?" "When was your last menstrual period?"     *No Answer*  Protocols used: WEAKNESS (GENERALIZED) AND FATIGUE-A-AH   This encounter was created in error - please disregard.

## 2017-05-17 ENCOUNTER — Encounter: Payer: Self-pay | Admitting: Internal Medicine

## 2017-05-17 ENCOUNTER — Ambulatory Visit (INDEPENDENT_AMBULATORY_CARE_PROVIDER_SITE_OTHER): Payer: Medicare Other | Admitting: Internal Medicine

## 2017-05-17 ENCOUNTER — Other Ambulatory Visit (INDEPENDENT_AMBULATORY_CARE_PROVIDER_SITE_OTHER): Payer: Medicare Other

## 2017-05-17 VITALS — BP 140/74 | HR 87 | Temp 98.0°F | Resp 16 | Wt 170.0 lb

## 2017-05-17 DIAGNOSIS — R5383 Other fatigue: Secondary | ICD-10-CM

## 2017-05-17 DIAGNOSIS — R1314 Dysphagia, pharyngoesophageal phase: Secondary | ICD-10-CM

## 2017-05-17 DIAGNOSIS — G479 Sleep disorder, unspecified: Secondary | ICD-10-CM | POA: Diagnosis not present

## 2017-05-17 DIAGNOSIS — I2510 Atherosclerotic heart disease of native coronary artery without angina pectoris: Secondary | ICD-10-CM

## 2017-05-17 DIAGNOSIS — I10 Essential (primary) hypertension: Secondary | ICD-10-CM

## 2017-05-17 LAB — COMPREHENSIVE METABOLIC PANEL
ALT: 8 U/L (ref 0–53)
AST: 14 U/L (ref 0–37)
Albumin: 3.9 g/dL (ref 3.5–5.2)
Alkaline Phosphatase: 48 U/L (ref 39–117)
BILIRUBIN TOTAL: 0.7 mg/dL (ref 0.2–1.2)
BUN: 26 mg/dL — ABNORMAL HIGH (ref 6–23)
CHLORIDE: 106 meq/L (ref 96–112)
CO2: 27 meq/L (ref 19–32)
CREATININE: 1.82 mg/dL — AB (ref 0.40–1.50)
Calcium: 8.9 mg/dL (ref 8.4–10.5)
GFR: 37.48 mL/min — ABNORMAL LOW (ref >60.00)
GLUCOSE: 93 mg/dL (ref 70–99)
Potassium: 4.4 mEq/L (ref 3.5–5.1)
Sodium: 138 mEq/L (ref 135–145)
Total Protein: 6.6 g/dL (ref 6.0–8.3)

## 2017-05-17 LAB — CBC WITH DIFFERENTIAL/PLATELET
BASOS ABS: 0 10*3/uL (ref 0.0–0.1)
BASOS PCT: 0.8 % (ref 0.0–3.0)
Eosinophils Absolute: 0.1 10*3/uL (ref 0.0–0.7)
Eosinophils Relative: 2.2 % (ref 0.0–5.0)
HCT: 40.6 % (ref 39.0–52.0)
Hemoglobin: 13.1 g/dL (ref 13.0–17.0)
LYMPHS ABS: 1.2 10*3/uL (ref 0.7–4.0)
Lymphocytes Relative: 19.6 % (ref 12.0–46.0)
MCHC: 32.2 g/dL (ref 30.0–36.0)
MCV: 78.8 fl (ref 78.0–100.0)
Monocytes Absolute: 0.8 10*3/uL (ref 0.1–1.0)
Monocytes Relative: 12.7 % — ABNORMAL HIGH (ref 3.0–12.0)
NEUTROS ABS: 3.9 10*3/uL (ref 1.4–7.7)
NEUTROS PCT: 64.7 % (ref 43.0–77.0)
PLATELETS: 201 10*3/uL (ref 150.0–400.0)
RBC: 5.16 Mil/uL (ref 4.22–5.81)
RDW: 15.9 % — AB (ref 11.5–15.5)
WBC: 6 10*3/uL (ref 4.0–10.5)

## 2017-05-17 LAB — TSH: TSH: 1.5 u[IU]/mL (ref 0.35–4.50)

## 2017-05-17 MED ORDER — RANITIDINE HCL 300 MG PO TABS: 300.0000 mg | ORAL_TABLET | Freq: Every day | ORAL | 5 refills | Status: DC

## 2017-05-17 NOTE — Addendum Note (Signed)
Addended by: Harland German A on: 05/17/2017 03:11 PM   Modules accepted: Orders

## 2017-05-17 NOTE — Telephone Encounter (Signed)
Per DPR form, left detailed message for patient that he may STOP AMLODIPINE. Instructed him to monitor symptoms and call early next week if there is no improvement after stopping.

## 2017-05-17 NOTE — Patient Instructions (Addendum)
Have blood work done.   Medications reviewed and updated.  Changes include starting zantac 300 mg at bedtime or in the evening.   Take tylenol nigntly - [phone] mg.   Your prescription(s) have been submitted to your pharmacy. Please take as directed and contact our office if you believe you are having problem(s) with the medication(s).   Please followup in 6 weeks

## 2017-05-18 ENCOUNTER — Encounter: Payer: Self-pay | Admitting: Internal Medicine

## 2017-05-18 DIAGNOSIS — R5383 Other fatigue: Secondary | ICD-10-CM | POA: Insufficient documentation

## 2017-05-18 DIAGNOSIS — G479 Sleep disorder, unspecified: Secondary | ICD-10-CM | POA: Insufficient documentation

## 2017-05-18 HISTORY — DX: Other fatigue: R53.83

## 2017-05-18 NOTE — Assessment & Plan Note (Signed)
Decreased sleep is likely contributing, but will do blood work to rule out other causes Possibly multifactorial Check TSH, CMP, CBC Stressed importance of improving sleep

## 2017-05-18 NOTE — Assessment & Plan Note (Addendum)
Complaining of some sore throat, hoarseness and difficulty swallowing Denies GERD, but symptoms consistent with atypical GERD No recent EGD Trial of Zantac 300 mg at bedtime

## 2017-05-18 NOTE — Assessment & Plan Note (Signed)
BP better controlled after amlodipine added - still variable but acceptable Will monitor over the weekend and contact cardio on Monday

## 2017-05-18 NOTE — Assessment & Plan Note (Signed)
Recently has had difficulty sleeping and for the past couple of weeks has not slept more than 4 hours Likely contributing to his fatigue He will try taking [phone] mg of Tylenol before going to bed to see if that helps May need to consider trazodone

## 2017-05-20 ENCOUNTER — Telehealth: Payer: Self-pay | Admitting: Cardiovascular Disease

## 2017-05-20 NOTE — Telephone Encounter (Signed)
Patient reports his blood pressure and weights are stable since stopping amlodipine. He says he feels fine. He is working closely with Dr. Quay Burow and will check in with her weekly. He was instructed to try Tylenol 1000 mg nightly for sleep and she started him on ranitidine 300 mg nightly.  He understands to call if he has any questions or concerns.

## 2017-05-20 NOTE — Telephone Encounter (Signed)
New message   Pt verbalized that he is returning the call for the rn    Theodoro Parma, RN at 05/17/2017 3:11 PM and also to give update   He verbalized that he has stopped Amlodipine on 05/17/2017    Pt verbalized that his BP HR and weight was asked from RN Michael:  05/18/2017  124/60  Hr  68 Weight  169    05-19-2017   BP  132/67   Hr   64   Weight 170  05/20/2017    127/58   Hr  71   Weight  167      Pt verbalized that he has seen Dr.Burns on 05/17/2017 examination was done and a blood test. His PCP put pt on Ranitidine 300mg  1x day at night for 30 days    PCP also told pt he can take 1000mg  of tyonlal at bedtime   He is to call and give report at his PCP office every Friday and follow up in 6 weeks   Now that he has stopped Amlodipine   He also takes Metoprolol 25mg  1x day at night

## 2017-05-21 ENCOUNTER — Encounter: Payer: Self-pay | Admitting: Internal Medicine

## 2017-05-27 ENCOUNTER — Telehealth: Payer: Self-pay | Admitting: Cardiovascular Disease

## 2017-05-27 DIAGNOSIS — I10 Essential (primary) hypertension: Secondary | ICD-10-CM

## 2017-05-27 NOTE — Telephone Encounter (Signed)
New message    Pt c/o BP issue: STAT if pt c/o blurred vision, one-sided weakness or slurred speech  1. What are your last 5 BP readings? 152/78, 139/69, 151/69, 149/69  2. Are you having any other symptoms (ex. Dizziness, headache, blurred vision, passed out)? NO  3. What is your BP issue? Patient feels BP is starting to run high

## 2017-05-27 NOTE — Telephone Encounter (Signed)
Mr. Hartt called to report his BP has been running higher than usual over the last week or so.  His only currently BP medication is Toprol 25 mg taken nightly. He checks his BP in the morning. Lately, it has been averaging 140s-150s/60s-70s. His HR runs mid 60s to 70s.  He stopped amlodipine 05/17/17 due to swelling in his hands, ankles, and feet. The swelling subsided quickly after stopping. He does complain of slight swelling again now. He noticed it after he went to the gym the other day and it's almost gone now.  He denies CP and SOB.  He understands he will be called with medication recommendations.

## 2017-05-28 ENCOUNTER — Telehealth: Payer: Self-pay | Admitting: Emergency Medicine

## 2017-05-28 NOTE — Telephone Encounter (Signed)
Called patient to schedule AWV. Patient declined at this time. 

## 2017-05-29 ENCOUNTER — Telehealth: Payer: Self-pay | Admitting: Internal Medicine

## 2017-05-29 NOTE — Telephone Encounter (Signed)
Spoke with [name] regarding AWV. Patient stated that he is not interested in scheduling his wellness appointment at this time. SF

## 2017-05-29 NOTE — Telephone Encounter (Signed)
BP running too high considering his CKD. Would be reasonable to try lisinopril 5 mg daily, but he needs very close FU of creatinine. Would arrange BMET in 1 week. Ask him to drink plenty of fluids. thx

## 2017-05-30 ENCOUNTER — Encounter: Payer: Self-pay | Admitting: *Deleted

## 2017-05-30 MED ORDER — LISINOPRIL 5 MG PO TABS: 5.0000 mg | ORAL_TABLET | Freq: Every day | ORAL | 11 refills | Status: DC

## 2017-05-30 NOTE — Telephone Encounter (Signed)
Instructed patient to START LISINOPRIL 5 mg daily. BMET scheduled next Friday. Patient will continue to monitor BP and drink plenty of fluids.  He was grateful for call and agrees with treatment plan.

## 2017-05-30 NOTE — Telephone Encounter (Signed)
This encounter was created in error - please disregard.

## 2017-06-02 ENCOUNTER — Telehealth: Payer: Self-pay | Admitting: Cardiology

## 2017-06-02 NOTE — Telephone Encounter (Signed)
I was notified by the operator to call the patient. He reports that since starting lisinopril he has had a scratchy throat with some soreness/hoarseness. He denies any dyspnea or difficulties breathing. I advised him to stop the lisinopril and watch his symptoms closely. If he has any change in his respiratory status he should call 911.

## 2017-06-04 ENCOUNTER — Telehealth: Payer: Self-pay | Admitting: Cardiovascular Disease

## 2017-06-04 NOTE — Progress Notes (Signed)
Subjective:    Patient ID: [name], male    DOB: [birth year], 84 y.o.   MRN: [id number]  HPI The patient is here for an acute visit.  Almost three weeks ago he was here for intermittent trouble swallowing, his throat feeling raw when he yawns, intermittent hoarseness.  He stated PND, but denied GERD.  We started zantac 300 mg at bedtime for possible atypical GERD.    His energy was also low, but that was thought to be related to poor sleep.  He has been taking tylenol every night to help with aches in hopes it would improve his sleep.    He took the Zantac for at least 10 days and felt fine initially.  For the first few days after he left here he was taking the Tylenol every night and he was sleeping well as well.   One week ago he started feeling hoarse and it worsened.  He had a sore throat and pain with swallowing.  He had a runny nose initially and then he developed nasal congestion with discolored mucus.  He has a headache and sinus pressure.     With his initial symptoms he was concerned that they may have been related to lisinopril and Zantac.  He did call cardiology and they advised stopping both of him.  He uses his flonase twice a day.  He uses saline nasal spray as well.    Medications and allergies reviewed with patient and updated if appropriate.  Patient Active Problem List   Diagnosis Date Noted  . Fatigue 05/18/2017  . Sleeping difficulty 05/18/2017  . Foot pain, bilateral 04/22/2017  . Trochanteric bursitis 01/10/2017  . Allergic reaction 11/14/2016  . Internal hemorrhoids 08/15/2016  . Loose stools 02/06/2016  . Dysphagia 02/06/2016  . Cephalalgia 07/14/2015  . Constipation 03/01/2015  . Claudication of left lower extremity ([place]) 03/02/2014  . CHB (complete heart block) ([place]) 05/03/2012  . Postoperative atrial fibrillation ([place]) 05/03/2012  . Disorder of kidney and ureter 04/10/2012  . Pacemaker 04/10/2012  . AV block, 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  March 2014 03/26/2012  . Symptomatic bradycardia 03/26/2012  . Coronary atherosclerosis 11/27/2011  . Presence of aortocoronary bypass graft 10/24/2011  . Pulmonary nodule   . CAD - CABG Oct 2013   . Chronic renal impairment, stage 3 (moderate) (HCC)   . Venous insufficiency of leg 06/05/2010  . SHOULDER PAIN, RIGHT, CHRONIC 10/14/2009  . B12 nutritional deficiency 08/23/2009  . Peripheral neuropathy 03/21/2009  . DEGENERATIVE DISC DISEASE, CERVICAL SPINE, W/RADICULOPATHY 09/21/2008  . Dyslipidemia 01/17/2007  . Essential hypertension 07/20/2006  . Prostate cancer ([place]) 07/20/2006    Current Outpatient Medications on File Prior to Visit  Medication Sig Dispense Refill  . acetaminophen (TYLENOL) 500 MG tablet Take 500 mg by mouth every 6 (six) hours as needed.    [name] aspirin EC 81 MG tablet Take 1 tablet (81 mg total) by mouth daily.    . carboxymethylcellulose (REFRESH PLUS) 0.5 % SOLN Place 1 drop into both eyes daily as needed (dry eyes). 30 mL 1  . DiphenhydrAMINE HCl, Sleep, (ZZZQUIL PO) Take 1 Dose by mouth at bedtime as needed (sleep).    . ergocalciferol (VITAMIN D2) 50000 UNITS capsule Take 50,000 Units by mouth every 30 (thirty) days. 15 th of each month    . fluticasone (FLONASE) 50 MCG/ACT nasal spray Place 2 sprays into the nose 2 (two) times daily.    . folic acid (FOLVITE) 1 MG tablet  TAKE 1 TABLET DAILY 90 tablet 3  . metoprolol succinate (TOPROL-XL) 25 MG 24 hr tablet Take 1 tablet (25 mg total) by mouth at bedtime. 90 tablet 3  . Multiple Vitamins-Minerals (PRESERVISION AREDS PO) Take 1 tablet by mouth daily.     . nitroGLYCERIN (NITROSTAT) 0.4 MG SL tablet DISSOLVE 1 TABLET UNDER THE TONGUE EVERY 5 MINUTES AS NEEDED FOR CHEST PAIN (MAX OF 3 TABLETS) 100 tablet 0  . simvastatin (ZOCOR) 20 MG tablet TAKE 1 TABLET DAILY (ANNUAL APPOINTMENT DUE IN [place]. MUST SEE DOCTOR FOR REFILLS) 90 tablet 3  . vitamin B-12 (CYANOCOBALAMIN) 500 MCG tablet Take 500 mcg by mouth daily.  Take one tablet every 3rd day    . vitamin C (ASCORBIC ACID) 500 MG tablet Take 1,000 mg by mouth daily.      No current facility-administered medications on file prior to visit.     Past Medical History:  Diagnosis Date  . Acute superficial venous thrombosis of lower extremity    a. RLE after CABG, neg dopp for DVT.  [name] Allergy   . Atrial fibrillation ([place])    a. Post-op from CABG, on amiodarone temporarily, d/c'd 12/2011  . CAD (coronary artery disease)    a. S/P stenting to mid RCA, prox PDA 06/1999. b. NSTEMI/CABG x 3 in 10/2011 with LIMA to LAD, SVG to PDA, and SVG to OM1.   . Chronic UTI    a. Followed by Dr. Risa Grill - colonized/asymptomatic - not on abx  . CKD (chronic kidney disease), stage III (HCC)    a. stable with a creatinine around 1.9-2.0 followed by nephrology.  . Dyslipidemia   . GERD (gastroesophageal reflux disease)   . Hypertension    well-controlled.  . Myocardial infarction ([place])   . Neck injury    a. C3-C4 and C4-C5 foraminal narrowing, severe  . Prostate cancer ([place])    a. 2001 s/p TURP.  . Pulmonary nodule    a. felt to be noncancerous.  Status post followup CT scan 4 mm and stable.  . Renal artery stenosis ([place])    a. 50% by cath 2001  . 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 cardia catherization  07-07-99  . CARDIAC SURGERY  10/18/12   open heart surgery  . CATARACT EXTRACTION W/ INTRAOCULAR LENS  IMPLANT, BILATERAL  3/205, 06/2013   [facility]  . COLONOSCOPY  04/12/07  . CORONARY ARTERY BYPASS GRAFT  10/19/2011   Procedure: CORONARY ARTERY BYPASS GRAFTING (CABG);  Surgeon: Gaye Pollack, MD;  Location: [facility];  Service: Open Heart Surgery;  Laterality: N/A;  times three using Left Internal Mammary Artery and Right Greater Saphenouse Vein Graft harvested Endoscopically  . edg  07-17-1994  . FLEXIBLE SIGMOIDOSCOPY  11-03-1997  . LEFT HEART CATHETERIZATION WITH  CORONARY ANGIOGRAM N/A 10/16/2011   Procedure: LEFT HEART CATHETERIZATION WITH CORONARY ANGIOGRAM;  Surgeon: Peter M Martinique, MD;  Location: [facility] CATH LAB;  Service: Cardiovascular;  Laterality: N/A;  . LEFT HEART CATHETERIZATION WITH CORONARY/GRAFT ANGIOGRAM N/A 03/11/2013   Procedure: LEFT HEART CATHETERIZATION WITH [name];  Surgeon: Blane Ohara, MD;  Location: [facility] CATH LAB;  Service: Cardiovascular;  Laterality: N/A;  . lumbar spinal disk and neck fusion surgery    . PACEMAKER INSERTION  03/28/12   PPM implanted for mobitz II AV block  . peripheral vascular catherization  11-24-03  . PERMANENT PACEMAKER INSERTION N/A  03/28/2012   Procedure: PERMANENT PACEMAKER INSERTION;  Surgeon: Thompson Grayer, MD;  Location: [facility] CATH LAB;  Service: Cardiovascular;  Laterality: N/A;  . PROSTATECTOMY    . renal circulation  10-01-03  . s/p ptca    . stents     X 2  . stress cardiolite  05-04-05   spring 09-negative except for apical thinning, EF 68%    Social History   Socioeconomic History  . Marital status: Married    Spouse name: [name]  . Number of children: 2  . Years of education: Not on file  . Highest education level: Not on file  Occupational History  . 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 Financial resource strain: Not on file  . Food insecurity:    Worry: Not on file    Inability: Not on file  . Transportation needs:    Medical: Not on file    Non-medical: Not on file  Tobacco Use  . Smoking status: Never Smoker  . Smokeless tobacco: Never Used  Substance and Sexual Activity  . Alcohol use: Yes    Comment: Rarely  . Drug use: No  . Sexual activity: Not on file  Lifestyle  . Physical activity:    Days per week: Not on file    Minutes per session: Not on file  . Stress: Not on file  Relationships  .  Social connections:    Talks on phone: Not on file    Gets together: Not on file    Attends religious service: Not on file    Active member of club or organization: Not on file    Attends meetings of clubs or organizations: Not on file    Relationship status: Not on file  Other Topics Concern  . Not on file  Social History Narrative   HSG, 1 year college.  married '52 - 3 years, divorced; married '56 - 3 years divorced; married '97-12 yrs - divorced; married '75 -. 1 son '57; 1 daughter - '53; 1 grandchild.  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  Very happily married.  End of life care: yes CPR, no long term mechanical ventilation, no heroic measures.    Family History  Problem Relation Age of Onset  . Coronary artery disease Father        died @ 53  . Other Mother        cerebral hemorrhage - died @ 40  . Arthritis Mother   . Cancer Brother        Bladder  . Prostate cancer Brother   . Nephritis Brother        died @ age 60.  . Other Brother        cerebral hemorrhage - died @ 27  . Heart disease Brother   . Breast cancer Other        niece x 2  . Diabetes Neg Hx   . Colon cancer Neg Hx     Review of Systems  Constitutional: Negative for chills and fever.  HENT: Positive for congestion, postnasal drip, sinus pressure and sore throat. Negative for ear pain.   Respiratory: Positive for cough (occ productive). Negative for shortness of breath and wheezing.   Cardiovascular: Negative for chest pain.  Neurological: Positive for  headaches.       Objective:   Vitals:   06/05/17 1033  BP: (!) 150/74  Pulse: 80  Resp: 16  Temp: 98.4 F (36.9 C)  SpO2: 98%   BP Readings from Last 3 Encounters:  06/05/17 (!) 150/74  05/17/17 140/74  04/22/17 (!) 144/68   Wt Readings from Last 3 Encounters:  06/05/17 170 lb (77.1 kg)  05/17/17 170 lb (77.1 kg)  04/22/17 172 lb (78 kg)   Body mass index is 23.71 kg/m.   Physical  Exam    GENERAL APPEARANCE: Appears stated age, well appearing, NAD EYES: conjunctiva clear, no icterus HEENT: bilateral tympanic membranes and ear canals normal, oropharynx with mild erythema, no thyromegaly, trachea midline, no cervical or supraclavicular lymphadenopathy LUNGS: Clear to auscultation without wheeze or crackles, unlabored breathing, good air entry bilaterally CARDIOVASCULAR: Normal S1,S2 without murmurs, no edema SKIN: Warm, dry      Assessment & Plan:    Discussed that he can restart the Zantac, but continue to hold lisinopril for now.  See Problem List for Assessment and Plan of chronic medical problems.

## 2017-06-04 NOTE — Telephone Encounter (Signed)
Agree with plan as outlined. thanks 

## 2017-06-04 NOTE — Telephone Encounter (Signed)
New Message:        Pt c/o medication issue:  1. Name of Medication: lisinopril (PRINIVIL,ZESTRIL) 5 MG tablet(06/01/17 started this medication)  2. How are you currently taking this medication (dosage and times per day)? Take 1 tablet (5 mg total) by mouth daily.  3. Are you having a reaction (difficulty breathing--STAT)? Having a hard time swallowing and tightness in the throat  4. What is your medication issue? Pt is not sure if this medication or another medication from Dr. Quay Burow the name of that medication is ranitidine (started this on 5/10)

## 2017-06-04 NOTE — Telephone Encounter (Signed)
[name] reports he experienced tightness in his throat, slight difficulty swallowing, and hoarseness shortly after he started Lisinopril. He only took 3 doses before noticing symptoms. He called the on-call provider over the weekend and was instructed to stop both Lisinopril and Zantac (both were started at similar times).  Confirmed with patient he feels better since stopping. He reports he only has slight hoarseness now and it is improving.  This morning, his BP was 115/70s.  Instructed him to continue monitoring BP and call if consistently over 140/90 and/or if symptoms do not continue to improve. Also encouraged the use of Benadryl to see if that would alleviate symptoms as well.  He understands he will be called if Dr. Burt Knack has further recommendations.   Med list and allergy list updated.

## 2017-06-05 ENCOUNTER — Encounter: Payer: Self-pay | Admitting: Internal Medicine

## 2017-06-05 ENCOUNTER — Ambulatory Visit (INDEPENDENT_AMBULATORY_CARE_PROVIDER_SITE_OTHER): Payer: Medicare Other | Admitting: Internal Medicine

## 2017-06-05 VITALS — BP 150/74 | HR 80 | Temp 98.4°F | Resp 16 | Wt 170.0 lb

## 2017-06-05 DIAGNOSIS — I2510 Atherosclerotic heart disease of native coronary artery without angina pectoris: Secondary | ICD-10-CM | POA: Diagnosis not present

## 2017-06-05 DIAGNOSIS — I10 Essential (primary) hypertension: Secondary | ICD-10-CM | POA: Diagnosis not present

## 2017-06-05 DIAGNOSIS — R0989 Other specified symptoms and signs involving the circulatory and respiratory systems: Secondary | ICD-10-CM | POA: Insufficient documentation

## 2017-06-05 DIAGNOSIS — J019 Acute sinusitis, unspecified: Secondary | ICD-10-CM

## 2017-06-05 MED ORDER — AZITHROMYCIN 250 MG PO TABS: ORAL_TABLET | ORAL | 0 refills | Status: DC

## 2017-06-05 NOTE — Assessment & Plan Note (Signed)
Symptoms consistent with sinus infection Start azithromycin Continue nasal sprays Continue increased rest and fluids Call if symptoms do not improve

## 2017-06-05 NOTE — Assessment & Plan Note (Signed)
Lisinopril stopped by cardiology for possible cough/side effects He will monitor his blood pressure at home and let cardiology know the results after 1-2 weeks-may need another low-dose of a different medication to help prevent spiking of blood pressure

## 2017-06-05 NOTE — Patient Instructions (Addendum)
Medications reviewed and updated.  Changes include taking an antibiotic for your sinus infection.   Your prescription(s) have been submitted to your pharmacy. Please take as directed and contact our office if you believe you are having problem(s) with the medication(s).  Continue your nasal sprays.      Sinusitis, Adult Sinusitis is soreness and inflammation of your sinuses. Sinuses are hollow spaces in the bones around your face. Your sinuses are located:  Around your eyes.  In the middle of your forehead.  Behind your nose.  In your cheekbones.  Your sinuses and nasal passages are lined with a stringy fluid (mucus). Mucus normally drains out of your sinuses. When your nasal tissues become inflamed or swollen, the mucus can become trapped or blocked so air cannot flow through your sinuses. This allows bacteria, viruses, and funguses to grow, which leads to infection. Sinusitis can develop quickly and last for 7?10 days (acute) or for more than 12 weeks (chronic). Sinusitis often develops after a cold. What are the causes? This condition is caused by anything that creates swelling in the sinuses or stops mucus from draining, including:  Allergies.  Asthma.  Bacterial or viral infection.  Abnormally shaped bones between the nasal passages.  Nasal growths that contain mucus (nasal polyps).  Narrow sinus openings.  Pollutants, such as chemicals or irritants in the air.  A foreign object stuck in the nose.  A fungal infection. This is rare.  What increases the risk? The following factors may make you more likely to develop this condition:  Having allergies or asthma.  Having had a recent cold or respiratory tract infection.  Having structural deformities or blockages in your nose or sinuses.  Having a weak immune system.  Doing a lot of swimming or diving.  Overusing nasal sprays.  Smoking.  What are the signs or symptoms? The main symptoms of this condition  are pain and a feeling of pressure around the affected sinuses. Other symptoms include:  Upper toothache.  Earache.  Headache.  Bad breath.  Decreased sense of smell and taste.  A cough that may get worse at night.  Fatigue.  Fever.  Thick drainage from your nose. The drainage is often green and it may contain pus (purulent).  Stuffy nose or congestion.  Postnasal drip. This is when extra mucus collects in the throat or back of the nose.  Swelling and warmth over the affected sinuses.  Sore throat.  Sensitivity to light.  How is this diagnosed? This condition is diagnosed based on symptoms, a medical history, and a physical exam. To find out if your condition is acute or chronic, your health care provider may:  Look in your nose for signs of nasal polyps.  Tap over the affected sinus to check for signs of infection.  View the inside of your sinuses using an imaging device that has a light attached (endoscope).  If your health care provider suspects that you have chronic sinusitis, you may also:  Be tested for allergies.  Have a sample of mucus taken from your nose (nasal culture) and checked for bacteria.  Have a mucus sample examined to see if your sinusitis is related to an allergy.  If your sinusitis does not respond to treatment and it lasts longer than 8 weeks, you may have an MRI or CT scan to check your sinuses. These scans also help to determine how severe your infection is. In rare cases, a bone biopsy may be done to rule out more  serious types of fungal sinus disease. How is this treated? Treatment for sinusitis depends on the cause and whether your condition is chronic or acute. If a virus is causing your sinusitis, your symptoms will go away on their own within 10 days. You may be given medicines to relieve your symptoms, including:  Topical nasal decongestants. They shrink swollen nasal passages and let mucus drain from your sinuses.  Antihistamines.  These drugs block inflammation that is triggered by allergies. This can help to ease swelling in your nose and sinuses.  Topical nasal corticosteroids. These are nasal sprays that ease inflammation and swelling in your nose and sinuses.  Nasal saline washes. These rinses can help to get rid of thick mucus in your nose.  If your condition is caused by bacteria, you will be given an antibiotic medicine. If your condition is caused by a fungus, you will be given an antifungal medicine. Surgery may be needed to correct underlying conditions, such as narrow nasal passages. Surgery may also be needed to remove polyps. Follow these instructions at home: Medicines  Take, use, or apply over-the-counter and prescription medicines only as told by your health care provider. These may include nasal sprays.  If you were prescribed an antibiotic medicine, take it as told by your health care provider. Do not stop taking the antibiotic even if you start to feel better. Hydrate and Humidify  Drink enough water to keep your urine clear or pale yellow. Staying hydrated will help to thin your mucus.  Use a cool mist humidifier to keep the humidity level in your home above 50%.  Inhale steam for 10-15 minutes, 3-4 times a day or as told by your health care provider. You can do this in the bathroom while a hot shower is running.  Limit your exposure to cool or dry air. Rest  Rest as much as possible.  Sleep with your head raised (elevated).  Make sure to get enough sleep each night. General instructions  Apply a warm, moist washcloth to your face 3-4 times a day or as told by your health care provider. This will help with discomfort.  Wash your hands often with soap and water to reduce your exposure to viruses and other germs. If soap and water are not available, use hand sanitizer.  Do not smoke. Avoid being around people who are smoking (secondhand smoke).  Keep all follow-up visits as told by your  health care provider. This is important. Contact a health care provider if:  You have a fever.  Your symptoms get worse.  Your symptoms do not improve within 10 days. Get help right away if:  You have a severe headache.  You have persistent vomiting.  You have pain or swelling around your face or eyes.  You have vision problems.  You develop confusion.  Your neck is stiff.  You have trouble breathing. This information is not intended to replace advice given to you by your health care provider. Make sure you discuss any questions you have with your health care provider. Document Released: 12/25/2004 Document Revised: 08/21/2015 Document Reviewed: 10/20/2014 Elsevier Interactive Patient Education  [employer].

## 2017-06-06 ENCOUNTER — Ambulatory Visit: Payer: Medicare Other | Admitting: Cardiovascular Disease

## 2017-06-07 ENCOUNTER — Other Ambulatory Visit: Payer: Medicare Other

## 2017-06-12 ENCOUNTER — Telehealth: Payer: Self-pay | Admitting: Cardiovascular Disease

## 2017-06-12 NOTE — Telephone Encounter (Signed)
New Message   Pt c/o medication issue:  1. Name of Medication: Lisinopril   2. How are you currently taking this medication (dosage and times per day)? n/a  3. Are you having a reaction (difficulty breathing--STAT)? /na  4. What is your medication issue? Pt states rhe medication is not working and someone was suppose to get back with him about it

## 2017-06-12 NOTE — Telephone Encounter (Signed)
[name] is very concerned about his blood pressure readings.  He states he has checked his BP 12 times over the last week, and 10 of the 12 readings are between 138/65 and 153/65. Only 2 readings were acceptable to him - 128/56 and 123/57. He is asymptomatic other than feeling "a little tired." Reiterated to him that his BP is mostly fine, but he requests medication instructions.  He also wanted to let Dr. Burt Knack know that his sister passed away.  To Dr. Burt Knack for recommendations.

## 2017-06-12 NOTE — Telephone Encounter (Signed)
I wouldn't make any changes yet. At 84 years old his BP control is acceptable. Would cut back on frequency of BP monitoring.

## 2017-06-13 NOTE — Telephone Encounter (Signed)
Patient understands no changes will be made yet - his BP is acceptable for his age.  Instructed him to cut back on frequency of checking BP. He was grateful for call.

## 2017-06-19 ENCOUNTER — Telehealth: Payer: Self-pay | Admitting: Emergency Medicine

## 2017-06-19 NOTE — Telephone Encounter (Signed)
Pt's chart is correct.

## 2017-06-19 NOTE — Telephone Encounter (Signed)
Copied from [place] [id number]. Topic: Inquiry >> Jun 19, 2017  8:35 AM Nils Flack wrote: Reason for CRM:pt called  he wants to make sure that when med is ordered, if it is more than 10 days, he wants it to go through [facility]. He does not need any meds at this time.

## 2017-06-27 NOTE — Progress Notes (Signed)
0.       Subjective:    Patient ID: [name], male    DOB: 05-15-1926, 84 y.o.   MRN: [id number]  HPI The patient is here for follow up.  He is not feeling well.  He tires easily and gets winded with activity.  His hands and feet are swelling.  Trouble swallowing: He is still having difficulty swallowing.  He states this is especially true for fruit.  He does experience difficulty swallowing 5-6 times a week.  He denies any GERD.  I have prescribed Zantac, but he started this in a different medication around the same time and had side effects and he had to stop it.  He is currently not taking anything for reflux.  He does not think he has reflux.  ?  Still has a sinus infection: He thinks he still may have a sinus infection.  We had treated him for this.  He has difficulty breathing out of the right nostril.  He has drainage, which causes a dry cough.  He is getting some frontal headaches, but they do not last long.  He has some tenderness on his right temple area.  His eyes are very sensitive to light touch when he touches them.  He is using his Flonase and saline nasal spray daily.  Hypertension: He is taking his metoprolol daily. He is compliant with a low sodium diet.  His blood pressure medication was recently adjusted by cardiology. His blood pressure is controlled most of the time at home, but medication he will experience [id number] systolically.  He is concerned his medication is not controlling his blood pressure enough.       Medications and allergies reviewed with patient and updated   His blood pressuref appropriate.  Patient Active Problem List   Diagnosis Date Noted  . Acute non-recurrent sinusitis 06/05/2017  . Fatigue 05/18/2017  . Sleeping difficulty 05/18/2017  . Foot pain, bilateral 04/22/2017  . Trochanteric bursitis 01/10/2017  . Allergic reaction 11/14/2016  . Internal hemorrhoids 08/15/2016  . Loose stools 02/06/2016  . Dysphagia 02/06/2016  .  Cephalalgia 07/14/2015  . Constipation 03/01/2015  . Claudication of left lower extremity ([place]) 03/02/2014  . CHB (complete heart block) ([place]) 05/03/2012  . Postoperative atrial fibrillation ([place]) 05/03/2012  . Disorder of kidney and ureter 04/10/2012  . Pacemaker 04/10/2012  . 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 Symptomatic bradycardia 03/26/2012  . Coronary atherosclerosis 11/27/2011  . Presence of aortocoronary bypass graft 10/24/2011  . Pulmonary nodule   . CAD - CABG Oct 2013   . Chronic renal impairment, stage 3 (moderate) (HCC)   . Venous insufficiency of leg 06/05/2010  . SHOULDER PAIN, RIGHT, CHRONIC 10/14/2009  . B12 nutritional deficiency 08/23/2009  . Peripheral neuropathy 03/21/2009  . DEGENERATIVE DISC DISEASE, CERVICAL SPINE, W/RADICULOPATHY 09/21/2008  . Dyslipidemia 01/17/2007  . Essential hypertension 07/20/2006  . Prostate cancer ([place]) 07/20/2006    Current Outpatient Medications on File Prior to Visit  Medication Sig Dispense Refill  . acetaminophen (TYLENOL) 500 MG tablet Take 500 mg by mouth every 6 (six) hours as needed.    [name] aspirin EC 81 MG tablet Take 1 tablet (81 mg total) by mouth daily.    . carboxymethylcellulose (REFRESH PLUS) 0.5 % SOLN Place 1 drop into both eyes daily as needed (dry eyes). 30 mL 1  . DiphenhydrAMINE HCl, Sleep, (ZZZQUIL PO) Take 1 Dose by mouth at bedtime as needed (sleep).    [name]  ergocalciferol (VITAMIN D2) 50000 UNITS capsule Take 50,000 Units by mouth every 30 (thirty) days. 15 th of each month    . fluticasone (FLONASE) 50 MCG/ACT nasal spray Place 2 sprays into the nose 2 (two) times daily.    . folic acid (FOLVITE) 1 MG tablet TAKE 1 TABLET DAILY 90 tablet 3  . metoprolol succinate (TOPROL-XL) 25 MG 24 hr tablet Take 1 tablet (25 mg total) by mouth at bedtime. 90 tablet 3  . nitroGLYCERIN (NITROSTAT) 0.4 MG SL tablet DISSOLVE 1 TABLET UNDER THE TONGUE EVERY 5 MINUTES AS NEEDED FOR CHEST PAIN (MAX OF 3  TABLETS) 100 tablet 0  . simvastatin (ZOCOR) 20 MG tablet TAKE 1 TABLET DAILY (ANNUAL APPOINTMENT DUE IN [place]. MUST SEE DOCTOR FOR REFILLS) 90 tablet 3  . vitamin B-12 (CYANOCOBALAMIN) 500 MCG tablet Take 500 mcg by mouth daily. Take one tablet every 3rd day    . vitamin C (ASCORBIC ACID) 500 MG tablet Take 1,000 mg by mouth daily.      No current facility-administered medications on file prior to visit.     Past Medical History:  Diagnosis Date  . Acute superficial venous thrombosis of lower extremity    a. RLE after CABG, neg dopp for DVT.  [name] Allergy   . Atrial fibrillation ([place])    a. Post-op from CABG, on amiodarone temporarily, d/c'd 12/2011  . CAD (coronary artery disease)    a. S/P stenting to mid RCA, prox PDA 06/1999. b. NSTEMI/CABG x 3 in 10/2011 with LIMA to LAD, SVG to PDA, and SVG to OM1.   . Chronic UTI    a. Followed by Dr. Risa Grill - colonized/asymptomatic - not on abx  . CKD (chronic kidney disease), stage III (HCC)    a. stable with a creatinine around 1.9-2.0 followed by nephrology.  . Dyslipidemia   . GERD (gastroesophageal reflux disease)   . Hypertension    well-controlled.  . Myocardial infarction ([place])   . Neck injury    a. C3-C4 and C4-C5 foraminal narrowing, severe  . Prostate cancer ([place])    a. 2001 s/p TURP.  . Pulmonary nodule    a. felt to be noncancerous.  Status post followup CT scan 4 mm and stable.  . Renal artery stenosis ([place])    a. 50% by cath 2001  . 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 cardia catherization  07-07-99  . CARDIAC SURGERY  10/18/12   open heart surgery  . CATARACT EXTRACTION W/ INTRAOCULAR LENS  IMPLANT, BILATERAL  3/205, 06/2013   [facility]  . COLONOSCOPY  04/12/07  . CORONARY ARTERY BYPASS GRAFT  10/19/2011   Procedure: CORONARY ARTERY BYPASS GRAFTING (CABG);  Surgeon: Gaye Pollack, MD;  Location: [facility];  Service: Open Heart  Surgery;  Laterality: N/A;  times three using Left Internal Mammary Artery and Right Greater Saphenouse Vein Graft harvested Endoscopically  . edg  07-17-1994  . FLEXIBLE SIGMOIDOSCOPY  11-03-1997  . LEFT HEART CATHETERIZATION WITH CORONARY ANGIOGRAM N/A 10/16/2011   Procedure: LEFT HEART CATHETERIZATION WITH CORONARY ANGIOGRAM;  Surgeon: Peter M Martinique, MD;  Location[facility] C[facility] LAB;  Service: Cardiovascular;  Laterality: N/A;  . LEFT HEART CATHETERIZATION WITH CORONARY/GRAFT ANGIOGRAM N/A 03/11/2013   Procedure: LEFT HEART CATHETERIZATION WITH [name];  Surgeon: Blane Ohara, MD;  Location: [facility] CATH LAB;  Service: Cardiovascular;  Laterality: N/A;  . lumbar spinal disk  and neck fusion surgery    . PACEMAKER INSERTION  03/28/12   PPM implanted for mobitz II AV block  . peripheral vascular catherization  11-24-03  . PERMANENT PACEMAKER INSERTION N/A 03/28/2012   Procedure: PERMANENT PACEMAKER INSERTION;  Surgeon: Thompson Grayer, MD;  Location: [facility] CATH LAB;  Service: Cardiovascular;  Laterality: N/A;  . PROSTATECTOMY    . renal circulation  10-01-03  . s/p ptca    . stents     X 2  . stress cardiolite  05-04-05   spring 09-negative except for apical thinning, EF 68%    Social History   Socioeconomic History  . Marital status: Married    Spouse name: [name]  . Number of children: 2  . Years of education: Not on file  . Highest education level: Not on file  Occupational History  . 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 Financial resource strain: Not on file  . Food insecurity:    Worry: Not on file    Inability: Not on file  . Transportation needs:    Medical: Not on file    Non-medical: Not on file  Tobacco Use  . Smoking status: Never Smoker  . Smokeless tobacco: Never Used  Substance and Sexual Activity  .  Alcohol use: Yes    Comment: Rarely  . Drug use: No  . Sexual activity: Not on file  Lifestyle  . Physical activity:    Days per week: Not on file    Minutes per session: Not on file  . Stress: Not on file  Relationships  . Social connections:    Talks on phone: Not on file    Gets together: Not on file    Attends religious service: Not on file    Active member of club or organization: Not on file    Attends meetings of clubs or organizations: Not on file    Relationship status: Not on file  Other Topics Concern  . Not on file  Social History Narrative   HSG, 1 year college.  married '52 - 3 years, divorced; married '56 - 3 years divorced; married '52-12 yrs - divorced; married '75 -. 1 son '57; 1 daughter - '53; 1 grandchild.  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  Very happily married.  End of life care: yes CPR, no long term mechanical ventilation, no heroic measures.    Family History  Problem Relation Age of Onset  . Coronary artery disease Father        died @ 68  . Other Mother        cerebral hemorrhage - died @ 20  . Arthritis Mother   . Cancer Brother        Bladder  . Prostate cancer Brother   . Nephritis Brother        died @ age 43.  . Other Brother        cerebral hemorrhage - died @ 24  . Heart disease Brother   . Breast cancer Other        niece x 2  . Diabetes Neg Hx   . Colon cancer Neg Hx     Review of Systems  Constitutional: Positive for fatigue. Negative for chills and fever.  HENT: Positive for congestion (  right side) and trouble swallowing. Negative for sinus pressure, sinus pain, sore throat (irritated) and voice change.        Clearing throat  Respiratory: Positive for cough and shortness of breath. Negative for wheezing.   Cardiovascular: Positive for leg swelling (hands as well). Negative for chest pain and palpitations.  Neurological: Positive for headaches.       Objective:   Vitals:    06/28/17 1415  BP: 134/76  Pulse: 86  Resp: 16  Temp: 98.1 F (36.7 C)  SpO2: 98%   BP Readings from Last 3 Encounters:  06/28/17 134/76  06/05/17 (!) 150/74  05/17/17 140/74   Wt Readings from Last 3 Encounters:  06/28/17 173 lb (78.5 kg)  06/05/17 170 lb (77.1 kg)  05/17/17 170 lb (77.1 kg)   Body mass index is 24.13 kg/m.   Physical Exam    Constitutional: Appears well-developed and well-nourished. No distress.  HENT:  Head: Normocephalic and atraumatic.  Neck: Neck supple. No tracheal deviation present. No thyromegaly present.  No cervical lymphadenopathy Cardiovascular: Normal rate, regular rhythm and normal heart sounds.   2/6 systolic murmur heard. No carotid bruit .  2+ b/l ankle edema Pulmonary/Chest: Effort normal and breath sounds normal. No respiratory distress. No has no wheezes. No rales.  Skin: Skin is warm and dry. Not diaphoretic.  Psychiatric: Normal mood and affect. Behavior is normal.      Assessment & Plan:    See Problem List for Assessment and Plan of chronic medical problems.

## 2017-06-28 ENCOUNTER — Ambulatory Visit (INDEPENDENT_AMBULATORY_CARE_PROVIDER_SITE_OTHER): Payer: Medicare Other | Admitting: Internal Medicine

## 2017-06-28 ENCOUNTER — Other Ambulatory Visit (INDEPENDENT_AMBULATORY_CARE_PROVIDER_SITE_OTHER): Payer: Medicare Other

## 2017-06-28 ENCOUNTER — Encounter: Payer: Self-pay | Admitting: Internal Medicine

## 2017-06-28 VITALS — BP 134/76 | HR 86 | Temp 98.1°F | Resp 16 | Wt 173.0 lb

## 2017-06-28 DIAGNOSIS — I10 Essential (primary) hypertension: Secondary | ICD-10-CM

## 2017-06-28 DIAGNOSIS — R609 Edema, unspecified: Secondary | ICD-10-CM

## 2017-06-28 DIAGNOSIS — R0989 Other specified symptoms and signs involving the circulatory and respiratory systems: Secondary | ICD-10-CM

## 2017-06-28 DIAGNOSIS — R1314 Dysphagia, pharyngoesophageal phase: Secondary | ICD-10-CM | POA: Diagnosis not present

## 2017-06-28 DIAGNOSIS — I2510 Atherosclerotic heart disease of native coronary artery without angina pectoris: Secondary | ICD-10-CM | POA: Diagnosis not present

## 2017-06-28 DIAGNOSIS — R0609 Other forms of dyspnea: Secondary | ICD-10-CM

## 2017-06-28 DIAGNOSIS — R5383 Other fatigue: Secondary | ICD-10-CM

## 2017-06-28 LAB — COMPREHENSIVE METABOLIC PANEL
ALK PHOS: 49 U/L (ref 39–117)
ALT: 12 U/L (ref 0–53)
AST: 20 U/L (ref 0–37)
Albumin: 4 g/dL (ref 3.5–5.2)
BILIRUBIN TOTAL: 0.7 mg/dL (ref 0.2–1.2)
BUN: 22 mg/dL (ref 6–23)
CO2: 28 mEq/L (ref 19–32)
Calcium: 8.9 mg/dL (ref 8.4–10.5)
Chloride: 106 mEq/L (ref 96–112)
Creatinine, Ser: 1.73 mg/dL — ABNORMAL HIGH (ref 0.40–1.50)
GFR: 39.73 mL/min — AB (ref >60.00)
GLUCOSE: 84 mg/dL (ref 70–99)
Potassium: 4.4 mEq/L (ref 3.5–5.1)
Sodium: 139 mEq/L (ref 135–145)
TOTAL PROTEIN: 6.6 g/dL (ref 6.0–8.3)

## 2017-06-28 LAB — BRAIN NATRIURETIC PEPTIDE: PRO B NATRI PEPTIDE: 437 pg/mL — AB (ref 0.0–100.0)

## 2017-06-28 NOTE — Patient Instructions (Addendum)
  Test(s) ordered today. Your results will be released to [place] (or called to you) after review, usually within 72hours after test completion. If any changes need to be made, you will be notified at that same time.  A Echo was ordered  - someone will call you to schedule it.    Medications reviewed and updated.   No changes recommended at this time.

## 2017-06-29 DIAGNOSIS — R0602 Shortness of breath: Secondary | ICD-10-CM | POA: Insufficient documentation

## 2017-06-29 DIAGNOSIS — R0609 Other forms of dyspnea: Secondary | ICD-10-CM

## 2017-06-29 DIAGNOSIS — R601 Generalized edema: Secondary | ICD-10-CM | POA: Insufficient documentation

## 2017-06-29 DIAGNOSIS — R609 Edema, unspecified: Secondary | ICD-10-CM | POA: Insufficient documentation

## 2017-06-29 NOTE — Assessment & Plan Note (Signed)
He is experiencing dyspnea on exertion in addition to hand and lower extremity edema No history of CHF-last echocardiogram showed normal EF and no diastolic dysfunction Likely fluid overloaded, which could be related to chronic kidney disease or CHF Check CMP, BMP Echocardiogram ordered Has an appointment with cardiology, but this may need to be moved up depending on results

## 2017-06-29 NOTE — Assessment & Plan Note (Signed)
Hand and lower extremity swelling Also having some increased fatigue and dyspnea on exertion Concern for CHF-BNP, CMP, echocardiogram ordered

## 2017-06-29 NOTE — Assessment & Plan Note (Signed)
Continuing to have dysphagia Possibly related to dysmotility, but his other symptoms of cough, hoarseness, throat clearing I do believe he has silent GERD Restart ranitidine 300 mg at bedtime May need GI referral

## 2017-06-29 NOTE — Assessment & Plan Note (Signed)
He was treated for sinus infection just less than 1 month ago, but is still experiencing some right sinus congestion, postnasal drip and throat clearing I do not think this is contributing to all of his other symptoms and I am not convinced he still has an infection Would like to avoid an additional antibiotic Continue Flonase and saline for now and monitor symptoms

## 2017-06-29 NOTE — Assessment & Plan Note (Signed)
Overall blood pressure looks fairly controlled.  He does have an occasional elevated reading, But on average controlled No changes We will follow-up with cardiology

## 2017-06-29 NOTE — Assessment & Plan Note (Signed)
He is experiencing significant fatigue and gets winded easily with activity His sleep is not good, which is likely contributing I believe silent GERD is contributing-start ranitidine Check CMP, BNP, echocardiogram May need to see cardiology earlier Possible UTI-culture pending per urology Possibly still has some mild sinus infection, but given allergies will hold off he will continue nasal spray and saline spray

## 2017-07-01 ENCOUNTER — Telehealth: Payer: Self-pay | Admitting: Emergency Medicine

## 2017-07-01 DIAGNOSIS — E8770 Fluid overload, unspecified: Secondary | ICD-10-CM

## 2017-07-01 MED ORDER — FUROSEMIDE 40 MG PO TABS: 40.0000 mg | ORAL_TABLET | Freq: Every day | ORAL | 0 refills | Status: DC

## 2017-07-01 NOTE — Telephone Encounter (Signed)
no

## 2017-07-01 NOTE — Telephone Encounter (Signed)
CMP and Lasix ordered. Did you want anything else with blood work?

## 2017-07-02 ENCOUNTER — Ambulatory Visit (HOSPITAL_COMMUNITY): Payer: Medicare Other | Attending: Internal Medicine

## 2017-07-02 ENCOUNTER — Other Ambulatory Visit: Payer: Self-pay

## 2017-07-02 DIAGNOSIS — R609 Edema, unspecified: Secondary | ICD-10-CM

## 2017-07-02 DIAGNOSIS — I10 Essential (primary) hypertension: Secondary | ICD-10-CM | POA: Insufficient documentation

## 2017-07-02 DIAGNOSIS — I252 Old myocardial infarction: Secondary | ICD-10-CM | POA: Insufficient documentation

## 2017-07-02 DIAGNOSIS — I441 Atrioventricular block, second degree: Secondary | ICD-10-CM | POA: Diagnosis not present

## 2017-07-02 DIAGNOSIS — E785 Hyperlipidemia, unspecified: Secondary | ICD-10-CM | POA: Diagnosis not present

## 2017-07-02 DIAGNOSIS — R0609 Other forms of dyspnea: Secondary | ICD-10-CM

## 2017-07-04 ENCOUNTER — Encounter: Payer: Self-pay | Admitting: Internal Medicine

## 2017-07-04 DIAGNOSIS — I5189 Other ill-defined heart diseases: Secondary | ICD-10-CM | POA: Insufficient documentation

## 2017-07-04 DIAGNOSIS — I351 Nonrheumatic aortic (valve) insufficiency: Secondary | ICD-10-CM | POA: Insufficient documentation

## 2017-07-04 HISTORY — DX: Nonrheumatic aortic (valve) insufficiency: I35.1

## 2017-07-04 HISTORY — DX: Other ill-defined heart diseases: I51.89

## 2017-07-05 ENCOUNTER — Other Ambulatory Visit (INDEPENDENT_AMBULATORY_CARE_PROVIDER_SITE_OTHER): Payer: Medicare Other

## 2017-07-05 DIAGNOSIS — E8770 Fluid overload, unspecified: Secondary | ICD-10-CM | POA: Diagnosis not present

## 2017-07-05 LAB — COMPREHENSIVE METABOLIC PANEL
ALK PHOS: 48 U/L (ref 39–117)
ALT: 10 U/L (ref 0–53)
AST: 20 U/L (ref 0–37)
Albumin: 4.2 g/dL (ref 3.5–5.2)
BILIRUBIN TOTAL: 0.7 mg/dL (ref 0.2–1.2)
BUN: 31 mg/dL — AB (ref 6–23)
CHLORIDE: 107 meq/L (ref 96–112)
CO2: 24 meq/L (ref 19–32)
CREATININE: 2.34 mg/dL — AB (ref 0.40–1.50)
Calcium: 9 mg/dL (ref 8.4–10.5)
GFR: 28.04 mL/min — ABNORMAL LOW (ref >60.00)
Glucose, Bld: 76 mg/dL (ref 70–99)
Potassium: 4.4 mEq/L (ref 3.5–5.1)
Sodium: 140 mEq/L (ref 135–145)
Total Protein: 6.6 g/dL (ref 6.0–8.3)

## 2017-07-08 ENCOUNTER — Encounter: Payer: Self-pay | Admitting: Cardiovascular Disease

## 2017-07-08 ENCOUNTER — Ambulatory Visit (INDEPENDENT_AMBULATORY_CARE_PROVIDER_SITE_OTHER): Payer: Medicare Other | Admitting: Cardiovascular Disease

## 2017-07-08 VITALS — BP 158/72 | HR 68 | Ht 71.0 in | Wt 168.0 lb

## 2017-07-08 DIAGNOSIS — I2510 Atherosclerotic heart disease of native coronary artery without angina pectoris: Secondary | ICD-10-CM | POA: Diagnosis not present

## 2017-07-08 DIAGNOSIS — E782 Mixed hyperlipidemia: Secondary | ICD-10-CM | POA: Diagnosis not present

## 2017-07-08 DIAGNOSIS — I10 Essential (primary) hypertension: Secondary | ICD-10-CM | POA: Diagnosis not present

## 2017-07-08 NOTE — Patient Instructions (Signed)

## 2017-07-08 NOTE — Progress Notes (Signed)
Cardiology Office Note Date:  07/08/2017   ID:  [name], DOB Jun 04, 1926, MRN [id number]  PCP:  [name], MD  Cardiologist:  [name], MD    Chief Complaint  Patient presents with  . Shortness of Breath  . Fatigue     History of Present Illness: [name] is a 84 y.o. male who presents for follow-up evaluation.  He underwent multivessel CABG in 2013 after presenting with non-ST elevation MI. He had previously undergone coronary stenting dating back to 2001. He was found to have severe three-vessel obstructive disease. Left ventricular function was normal. He was bypassed with the LIMA to LAD, vein graft obtuse marginal, and vein graft to PDA. He also required permanent pacing in 2014 after presenting with second degree type II AV block.Cardiac catheterization was performed in 2015 after an abnormal nuclear scan.This demonstrated continued patency of the LIMA to LAD graft and early graft failure of the saphenous vein grafts. The patient had collateral filling of the RCA distribution and moderate stenosis of the native left circumflex. Medical therapy was recommended.Comorbid medical conditions include chronic kidney disease and HTN.  The patient is here with his wife today.  He continues to his last adjustment of his pacemaker to improve his energy level.  States his legs feel weak when he walks a short distance.  Shortness of breath, orthopnea, dyspnea.  He is not sleeping well.  He has had leg swelling recently treated with a short course of furosemide which dramatically.  His creatinine is 2.3 mg/dL.  He follows with Dr. Justin Mend.  Past Medical History:  Diagnosis Date  . Acute superficial venous thrombosis of lower extremity    a. RLE after CABG, neg dopp for DVT.  [name] Allergy   . Atrial fibrillation ([place])    a. Post-op from CABG, on amiodarone temporarily, d/c'd 12/2011  . CAD (coronary artery disease)    a. S/P stenting to mid RCA, prox PDA 06/1999. b.  NSTEMI/CABG x 3 in 10/2011 with LIMA to LAD, SVG to PDA, and SVG to OM1.   . Chronic UTI    a. Followed by Dr. Risa Grill - colonized/asymptomatic - not on abx  . CKD (chronic kidney disease), stage III ([facility])    a. stable with a creatinine around 1.9-2.0 followed by nephrology.  . Dyslipidemia   . GERD (gastroesophageal reflux disease)   . Hypertension    well-controlled.  . Myocardial infarction ([place])   . Neck injury    a. C3-C4 and C4-C5 foraminal narrowing, severe  . Prostate cancer ([place])    a. 2001 s/p TURP.  . Pulmonary nodule    a. felt to be noncancerous.  Status post followup CT scan 4 mm and stable.  . Renal artery stenosis ([place])    a. 50% by cath 2001  . 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 cardia catherization  07-07-99  . CARDIAC SURGERY  10/18/12   open heart surgery  . CATARACT EXTRACTION W/ INTRAOCULAR LENS  IMPLANT, BILATERAL  3/205, 06/2013   [facility]  . COLONOSCOPY  04/12/07  . CORONARY ARTERY BYPASS GRAFT  10/19/2011   Procedure: CORONARY ARTERY BYPASS GRAFTING (CABG);  Surgeon: Gaye Pollack, MD;  Location: [facility];  Service: Open Heart Surgery;  Laterality: N/A;  times three using Left Internal Mammary Artery and Right Greater Saphenouse Vein Graft harvested Endoscopically  . edg  07-17-1994  . FLEXIBLE SIGMOIDOSCOPY  11-03-1997  . LEFT HEART CATHETERIZATION WITH CORONARY ANGIOGRAM N/A 10/16/2011   Procedure: LEFT HEART CATHETERIZATION WITH CORONARY ANGIOGRAM;  Surgeon: Peter M Martinique, MD;  Location: [facility] CATH LAB;  Service: Cardiovascular;  Laterality: N/A;  . LEFT HEART CATHETERIZATION WITH CORONARY/GRAFT ANGIOGRAM N/A 03/11/2013   Procedure: LEFT HEART CATHETERIZATION WITH [name];  Surgeon: Blane Ohara, MD;  Location: [facility] CATH LAB;  Service: Cardiovascular;  Laterality: N/A;  . lumbar spinal disk and neck fusion surgery    . PACEMAKER INSERTION  03/28/12     PPM implanted for mobitz II AV block  . peripheral vascular catherization  11-24-03  . PERMANENT PACEMAKER INSERTION N/A 03/28/2012   Procedure: PERMANENT PACEMAKER INSERTION;  Surgeon: Thompson Grayer, MD;  Location: [facility] C[facility] LAB;  Service: Cardiovascular;  Laterality: N/A;  . PROSTATECTOMY    . renal circulation  10-01-03  . s/p ptca    . stents     X 2  . stress cardiolite  05-04-05   spring 09-negative except for apical thinning, EF 68%    Current Outpatient Medications  Medication Sig Dispense Refill  . acetaminophen (TYLENOL) 500 MG tablet Take 500 mg by mouth every 6 (six) hours as needed.    [name] aspirin EC 81 MG tablet Take 1 tablet (81 mg total) by mouth daily.    . carboxymethylcellulose (REFRESH PLUS) 0.5 % SOLN Place 1 drop into both eyes daily as needed (dry eyes). 30 mL 1  . DiphenhydrAMINE HCl, Sleep, (ZZZQUIL PO) Take 1 Dose by mouth at bedtime as needed (sleep).    . ergocalciferol (VITAMIN D2) 50000 UNITS capsule Take 50,000 Units by mouth every 30 (thirty) days. 15 th of each month    . fluticasone (FLONASE) 50 MCG/ACT nasal spray Place 2 sprays into the nose 2 (two) times daily.    . folic acid (FOLVITE) 1 MG tablet TAKE 1 TABLET DAILY 90 tablet 3  . metoprolol succinate (TOPROL-XL) 25 MG 24 hr tablet Take 1 tablet (25 mg total) by mouth at bedtime. 90 tablet 3  . nitroGLYCERIN (NITROSTAT) 0.4 MG SL tablet DISSOLVE 1 TABLET UNDER THE TONGUE EVERY 5 MINUTES AS NEEDED FOR CHEST PAIN (MAX OF 3 TABLETS) 100 tablet 0  . simvastatin (ZOCOR) 20 MG tablet TAKE 1 TABLET DAILY (ANNUAL APPOINTMENT DUE IN [place]. MUST SEE DOCTOR FOR REFILLS) 90 tablet 3  . sulfamethoxazole-trimethoprim (BACTRIM DS,SEPTRA DS) 800-160 MG tablet Take 1 tablet by mouth daily.  0  . vitamin B-12 (CYANOCOBALAMIN) 500 MCG tablet Take 500 mcg by mouth daily. Take one tablet every 3rd day    . vitamin C (ASCORBIC ACID) 500 MG tablet Take 1,000 mg by mouth daily.      No current facility-administered medications  for this visit.    Allergies:   Amoxicillin; Aspirin; Atarax [hydroxyzine hcl]; Cephalexin; Ciprofloxacin; Clindamycin; Clobetasol; Codeine; Fluarix [flu virus vaccine]; Haemophilus influenzae; Hydrocodone; Hydrocodone-acetaminophen; Hydroxyzine; Latex; Lisinopril; Macrobid [nitrofurantoin macrocrystal]; Niacin; Niacin-lovastatin er; Niacin-lovastatin er; Vibramycin  [doxycycline]; Bactrim [sulfamethoxazole-trimethoprim]; and Colchicine   Social History:  The patient  reports that he has never smoked. He has never used smokeless tobacco. He reports that he drinks alcohol. He reports that he does not use drugs.   Family History:  The patient's  family history includes Arthritis in his mother; Breast cancer in his other; Cancer in his brother; Coronary artery disease in his father; Heart disease in his brother; Nephritis in his brother; Other in his brother and mother; Prostate cancer  in his brother.   ROS:  Please see the history of present illness.  Otherwise, review of systems is positive for cough, muscle pain, balance problems.  All other systems are reviewed and negative.   PHYSICAL EXAM: VS:  BP (!) 158/72   Pulse 68   Ht 5\' 11"  (1.803 m)   Wt 168 lb (76.2 kg)   SpO2 97%   BMI 23.43 kg/m  , BMI Body mass index is 23.43 kg/m. GEN: Well nourished, well developed, pleasant elderly in no acute distress  HEENT: normal  Neck: no JVD, no masses. No carotid bruits Cardiac: RRR without murmur or gallop      Respiratory:  clear to auscultation bilaterally, normal work of breathing GI: soft, nontender, nondistended, + BS MS: no deformity or atrophy  Ext: Trace bilateral pretibial edema, pedal pulses 2+= bilaterally Skin: warm and dry, no rash Neuro:  Strength and sensation are intact Psych: euthymic mood, full affect  EKG:  EKG is not ordered today.  Recent Labs: 05/17/2017: Hemoglobin 13.1; Platelets 201.0; TSH 1.50 06/28/2017: Pro B Natriuretic peptide (BNP) 437.0 07/05/2017: ALT 10; BUN  31; Creatinine, Ser 2.34; Potassium 4.4; Sodium 140   Lipid Panel     Component Value Date/Time   CHOL 122 09/04/2016 1123   TRIG 59.0 09/04/2016 1123   TRIG 107 01/16/2006 1338   HDL 40.40 09/04/2016 1123   CHOLHDL 3 09/04/2016 1123   VLDL 11.8 09/04/2016 1123   LDLCALC 70 09/04/2016 1123      Wt Readings from Last 3 Encounters:  07/08/17 168 lb (76.2 kg)  06/28/17 173 lb (78.5 kg)  06/05/17 170 lb (77.1 kg)     Cardiac Studies Reviewed: Echocardiogram 2016-07-10: Study Conclusions  - Left ventricle: The cavity size was normal. There was mild   concentric hypertrophy. Systolic function was normal. The   estimated ejection fraction was in the range of 55% to 60%. Wall   motion was normal; there were no regional wall motion   abnormalities. Doppler parameters are consistent with abnormal   left ventricular relaxation (grade 1 diastolic dysfunction).   Doppler parameters are consistent with high ventricular filling   pressure. - Aortic valve: Valve mobility was restricted. There was very mild   stenosis. There was moderate regurgitation. Peak velocity (S):   207.57 cm/s. Mean gradient (S): 9 mm Hg. Regurgitation pressure   half-time: 481 ms. - Mitral valve: Mildly calcified annulus. Transvalvular velocity   was within the normal range. There was no evidence for stenosis.   There was mild regurgitation. - Left atrium: The atrium was moderately dilated. - Right ventricle: The cavity size was normal. Wall thickness was   normal. Systolic function was normal. - Atrial septum: No defect or patent foramen ovale was identified   by color flow Doppler. - Tricuspid valve: There was mild-moderate regurgitation. - Pulmonary arteries: Systolic pressure was within the normal   range. PA peak pressure: 39 mm Hg (S).  ASSESSMENT AND PLAN: 1.  Coronary artery disease, native vessel, without angina: Patient will continue on his current medical program medications reviewed aspirin,  beta-blocker.  2 hyperlipidemia: Treated with simvastatin.  Last lipids show an LDL of 70, HDL 40, total cholesterol 120.  3. Hypertension with stage IV chronic kidney disease: He brings in home readings with most systolic blood pressures at 120s and 130s.  He has an occasional reading in the 160s.  Diastolic readings are in the 50s and 60s.  He will continue on his current medicines.  4.  AV block status post permanent pacemaker placement: Followed by Dr. Rayann Heman.  5.  Fatigue: suspect multifactorial - medication, age, heart disease, kidney disease. I don't think there is any one intervention that will make a specific improvement. Lengthy discussion today.  Current medicines are reviewed with the patient today.  The patient does not have concerns regarding medicines.  Labs/ tests ordered today include:  No orders of the defined types were placed in this encounter.  Disposition:   FU 6 months  Signed, [name], MD  07/08/2017 5:52 PM    [address], [place], [place]  [address] Phone: [phone]; Fax: [phone]

## 2017-07-16 ENCOUNTER — Ambulatory Visit (INDEPENDENT_AMBULATORY_CARE_PROVIDER_SITE_OTHER): Payer: Medicare Other | Admitting: *Deleted

## 2017-07-16 DIAGNOSIS — R001 Bradycardia, unspecified: Secondary | ICD-10-CM | POA: Diagnosis not present

## 2017-07-17 NOTE — Progress Notes (Signed)
Remote pacemaker transmission.   

## 2017-07-31 LAB — CUP PACEART REMOTE DEVICE CHECK
Battery Impedance: 305 Ohm
Battery Remaining Longevity: 111 mo
Battery Voltage: 2.79 V
Brady Statistic AP VP Percent: 0 %
Brady Statistic AP VS Percent: 49 %
Brady Statistic AS VP Percent: 0 %
Date Time Interrogation Session: 20190709143057
Implantable Lead Location: 753860
Implantable Lead Model: 5076
Implantable Lead Model: 5092
Lead Channel Impedance Value: 422 Ohm
Lead Channel Pacing Threshold Amplitude: 0.625 V
Lead Channel Pacing Threshold Amplitude: 0.875 V
Lead Channel Pacing Threshold Pulse Width: 0.4 ms
Lead Channel Pacing Threshold Pulse Width: 0.4 ms
Lead Channel Setting Pacing Amplitude: 2 V
Lead Channel Setting Pacing Pulse Width: 0.4 ms
MDC IDC LEAD IMPLANT DT: 20140321
MDC IDC LEAD IMPLANT DT: 20140321
MDC IDC LEAD LOCATION: 753859
MDC IDC MSMT LEADCHNL RV IMPEDANCE VALUE: 597 Ohm
MDC IDC PG IMPLANT DT: 20140321
MDC IDC SET LEADCHNL RV PACING AMPLITUDE: 2.5 V
MDC IDC SET LEADCHNL RV SENSING SENSITIVITY: 5.6 mV
MDC IDC STAT BRADY AS VS PERCENT: 51 %

## 2017-08-02 ENCOUNTER — Ambulatory Visit: Payer: Self-pay | Admitting: Internal Medicine

## 2017-08-02 NOTE — Telephone Encounter (Signed)
  He called in with several concerns.   Please see the triage notes below.   The flow coordinator was able to schedule him with Dr. Quay Burow for 08/06/17 at 10:00 a 30 minute appt.  He started out with c/o having a rash and itching questionably from one of his prescriptions.   He also mentioned both of his ankles are swollen and have been for the last 2-3 days.   He then went on to c/o having itching bad in his scalp and on arms and legs.    It was not clear to me which medications he was referring to when mentioning them.  I made a guess based on his attempt to spell them as to which ones he was talking about.   It was very unclear.  He also c/o being very tired.  It is not getting any better.    I then mentioned due to the number of concerns he had that I felt it best to make him an appt.   He agreed.   Reason for Disposition . Caller requesting a NON-URGENT new prescription or refill and triager unable to refill per unit policy  Answer Assessment - Initial Assessment Questions 1. SYMPTOMS: "Do you have any symptoms?"     I Ranitivine was prescribed.   Also Amlodipine.   Both had same symptoms.   Rash and itching.    I was told to stop taking them to see if they would help stop the itching.    I restarted the Ranitivine a couple of weeks ago and the itching has returned.    I'm also starting to swell in my ankles and the itching has restarted.    Not taking the Amlodipine.  I take metoprolol only at night.   [place]aine  2. SEVERITY: If symptoms are present, ask "Are they mild, moderate or severe?"     My scalp is itching like crazy and my arms are itching.   1/2 way up my calves I'm swelling both legs.    This morning I could feel my body trembling inside.   There is no pain.    No chills or fever.    I'm very tired all the time.   Some mornings I feel so tired I don't feel like getting out of bed.    The medication Dr. Quay Burow prescribed helped at first but not now for tiredness.  She thought I  was having reflux but I didn't know it.   The medicine worked at first but now it's now.  Protocols used: MEDICATION QUESTION CALL-A-AH

## 2017-08-04 NOTE — Progress Notes (Signed)
Subjective:    Patient ID: [name], male    DOB: 07/07/1926, 84 y.o.   MRN: [id number]  HPI The patient is here for an acute visit.  Ankle swelling: The swelling in his lower legs is worse.  He did see Dr. Burt Knack earlier this month and no change in medication was made.  He has an appointment with his nephrologist, Dr. Justin Mend, next week.  He states he feels he is fairly compliant with a low-sodium diet, but he does eat out at a [place] on a regular basis.  He does not necessarily check labels regularly.  He is not good about elevating his legs.  He typically wears elevated socks, but has not been wearing his compression socks because it just moves the swelling upward.  He is not noticed increasing shortness of breath-he does get shortness of breath with exertion at times, which is not new.  He is continuous tingling in his bilateral feet.  The tingling is on the plantar surface.  This has become more persistent.  Rash: redness and itching - the redness and itching started about 4-5 days ago.  Jergens lotion helps a little.  There has no change in meds/supplements in the past couple of weeks, except he stopped the ranitidine.  He tried aspercreame with lidocaine and it helped a little on the legs.  He denies rash or itching elsewhere.  He has not been able to identify any cause  Hoarseness:  He continues to have intermittent hoarseness.  He coughs up phlegm intermittently.  He would like a refill of the flonase.  He does have a lot of postnasal drip and drainage.  He denies any sinus pain or pressure.  He denies any GERD.  He has done a trial of ranitidine and that has not helped the need to discontinue this medication.  He was concerned the medication may have been causing some side effects.  He did have an EGD 03/2016 that did not show any reflux.      Medications and allergies reviewed with patient and updated if appropriate.  Patient Active Problem List   Diagnosis Date  Noted  . Moderate aortic regurgitation 07/04/2017  . Diastolic dysfunction 23/55/7322  . Edema 06/29/2017  . DOE (dyspnea on exertion) 06/29/2017  . Sinus symptom 06/05/2017  . Fatigue 05/18/2017  . Sleeping difficulty 05/18/2017  . Foot pain, bilateral 04/22/2017  . Trochanteric bursitis 01/10/2017  . Allergic reaction 11/14/2016  . Internal hemorrhoids 08/15/2016  . Loose stools 02/06/2016  . Dysphagia 02/06/2016  . Cephalalgia 07/14/2015  . Constipation 03/01/2015  . Claudication of left lower extremity ([place]) 03/02/2014  . CHB (complete heart block) ([place]) 05/03/2012  . Postoperative atrial fibrillation ([place]) 05/03/2012  . Pacemaker 04/10/2012  . 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 Symptomatic bradycardia 03/26/2012  . Coronary atherosclerosis 11/27/2011  . Presence of aortocoronary bypass graft 10/24/2011  . Pulmonary nodule   . CAD - CABG Oct 2013   . Chronic renal impairment, stage 3 (moderate) (HCC)   . Venous insufficiency of leg 06/05/2010  . SHOULDER PAIN, RIGHT, CHRONIC 10/14/2009  . B12 nutritional deficiency 08/23/2009  . Peripheral neuropathy 03/21/2009  . DEGENERATIVE DISC DISEASE, CERVICAL SPINE, W/RADICULOPATHY 09/21/2008  . Dyslipidemia 01/17/2007  . Essential hypertension 07/20/2006  . Prostate cancer ([place]) 07/20/2006    Current Outpatient Medications on File Prior to Visit  Medication Sig Dispense Refill  . acetaminophen (TYLENOL) 500 MG tablet Take 500 mg by  mouth every 6 (six) hours as needed.    [name] aspirin EC 81 MG tablet Take 1 tablet (81 mg total) by mouth daily.    . carboxymethylcellulose (REFRESH PLUS) 0.5 % SOLN Place 1 drop into both eyes daily as needed (dry eyes). 30 mL 1  . DiphenhydrAMINE HCl, Sleep, (ZZZQUIL PO) Take 1 Dose by mouth at bedtime as needed (sleep).    . ergocalciferol (VITAMIN D2) 50000 UNITS capsule Take 50,000 Units by mouth every 30 (thirty) days. 15 th of each month    . fluticasone (FLONASE) 50  MCG/ACT nasal spray Place 2 sprays into the nose 2 (two) times daily.    . folic acid (FOLVITE) 1 MG tablet TAKE 1 TABLET DAILY 90 tablet 3  . metoprolol succinate (TOPROL-XL) 25 MG 24 hr tablet Take 1 tablet (25 mg total) by mouth at bedtime. 90 tablet 3  . nitroGLYCERIN (NITROSTAT) 0.4 MG SL tablet DISSOLVE 1 TABLET UNDER THE TONGUE EVERY 5 MINUTES AS NEEDED FOR CHEST PAIN (MAX OF 3 TABLETS) 100 tablet 0  . simvastatin (ZOCOR) 20 MG tablet TAKE 1 TABLET DAILY (ANNUAL APPOINTMENT DUE IN [place]. MUST SEE DOCTOR FOR REFILLS) 90 tablet 3  . vitamin B-12 (CYANOCOBALAMIN) 500 MCG tablet Take 500 mcg by mouth daily. Take one tablet every 3rd day    . vitamin C (ASCORBIC ACID) 500 MG tablet Take 1,000 mg by mouth daily.      No current facility-administered medications on file prior to visit.     Past Medical History:  Diagnosis Date  . Acute superficial venous thrombosis of lower extremity    a. RLE after CABG, neg dopp for DVT.  [name] Allergy   . Atrial fibrillation ([place])    a. Post-op from CABG, on amiodarone temporarily, d/c'd 12/2011  . CAD (coronary artery disease)    a. S/P stenting to mid RCA, prox PDA 06/1999. b. NSTEMI/CABG x 3 in 10/2011 with LIMA to LAD, SVG to PDA, and SVG to OM1.   . Chronic UTI    a. Followed by Dr. Risa Grill - colonized/asymptomatic - not on abx  . CKD (chronic kidney disease), stage III (HCC)    a. stable with a creatinine around 1.9-2.0 followed by nephrology.  . Dyslipidemia   . GERD (gastroesophageal reflux disease)   . Hypertension    well-controlled.  . Myocardial infarction ([place])   . Neck injury    a. C3-C4 and C4-C5 foraminal narrowing, severe  . Prostate cancer ([place])    a. 2001 s/p TURP.  . Pulmonary nodule    a. felt to be noncancerous.  Status post followup CT scan 4 mm and stable.  . Renal artery stenosis ([place])    a. 50% by cath 2001  . 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 cardia catherization  07-07-99  . CARDIAC SURGERY  10/18/12   open heart surgery  . CATARACT EXTRACTION W/ INTRAOCULAR LENS  IMPLANT, BILATERAL  3/205, 06/2013   [facility]  . COLONOSCOPY  04/12/07  . CORONARY ARTERY BYPASS GRAFT  10/19/2011   Procedure: CORONARY ARTERY BYPASS GRAFTING (CABG);  Surgeon: Gaye Pollack, MD;  Location: [facility];  Service: Open Heart Surgery;  Laterality: N/A;  times three using Left Internal Mammary Artery and Right Greater Saphenouse Vein Graft harvested Endoscopically  . edg  07-17-1994  . FLEXIBLE SIGMOIDOSCOPY  11-03-1997  . LEFT HEART CATHETERIZATION WITH  CORONARY ANGIOGRAM N/A 10/16/2011   Procedure: LEFT HEART CATHETERIZATION WITH CORONARY ANGIOGRAM;  Surgeon: Peter M Martinique, MD;  Location: [facility] CATH LAB;  Service: Cardiovascular;  Laterality: N/A;  . LEFT HEART CATHETERIZATION WITH CORONARY/GRAFT ANGIOGRAM N/A 03/11/2013   Procedure: LEFT HEART CATHETERIZATION WITH [name];  Surgeon: Blane Ohara, MD;  Location: [facility] CATH LAB;  Service: Cardiovascular;  Laterality: N/A;  . lumbar spinal disk and neck fusion surgery    . PACEMAKER INSERTION  03/28/12   PPM implanted for mobitz II AV block  . peripheral vascular catherization  11-24-03  . PERMANENT PACEMAKER INSERTION N/A 03/28/2012   Procedure: PERMANENT PACEMAKER INSERTION;  Surgeon: Thompson Grayer, MD;  Location: [facility] CATH LAB;  Service: Cardiovascular;  Laterality: N/A;  . PROSTATECTOMY    . renal circulation  10-01-03  . s/p ptca    . stents     X 2  . stress cardiolite  05-04-05   spring 09-negative except for apical thinning, EF 68%    Social History   Socioeconomic History  . Marital status: Married    Spouse name: [name]  . Number of children: 2  . Years of education: Not on file  . Highest education level: Not on file  Occupational History  . 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 Financial resource strain: Not on file  . Food insecurity:    Worry: Not on file    Inability: Not on file  . Transportation needs:    Medical: Not on file    Non-medical: Not on file  Tobacco Use  . Smoking status: Never Smoker  . Smokeless tobacco: Never Used  Substance and Sexual Activity  . Alcohol use: Yes    Comment: Rarely  . Drug use: No  . Sexual activity: Not on file  Lifestyle  . Physical activity:    Days per week: Not on file    Minutes per session: Not on file  . Stress: Not on file  Relationships  . Social connections:    Talks on phone: Not on file    Gets together: Not on file    Attends religious service: Not on file    Active member of club or organization: Not on file    Attends meetings of clubs or organizations: Not on file    Relationship status: Not on file  Other Topics Concern  . Not on file  Social History Narrative   HSG, 1 year college.  married '52 - 3 years, divorced; married '56 - 3 years divorced; married '14-12 yrs - divorced; married '75 -. 1 son '57; 1 daughter - '53; 1 grandchild.  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  Very happily married.  End of life care: yes CPR, no long term mechanical ventilation, no heroic measures.    Family History  Problem Relation Age of Onset  . Coronary artery disease Father        died @ 61  . Other Mother        cerebral hemorrhage - died @ 21  . Arthritis Mother   . Cancer Brother        Bladder  . Prostate cancer Brother   . Nephritis Brother        died @ age 54.  [name]  Other Brother        cerebral hemorrhage - died @ 54  . Heart disease Brother   . Breast cancer Other        niece x 2  . Diabetes Neg Hx   . Colon cancer Neg Hx     Review of Systems  Constitutional: Positive for fatigue (sometimes with exertion). Negative for chills and fever.  HENT: Positive  for postnasal drip, trouble swallowing (at times) and voice change (intermittent hoarseness). Negative for sinus pressure and sinus pain.   Respiratory: Positive for cough (from PND) and shortness of breath (soemtimes with exertion). Negative for wheezing.   Cardiovascular: Positive for leg swelling. Negative for chest pain and palpitations.  Gastrointestinal:       No gerd  Skin: Positive for rash.  Neurological: Negative for light-headedness and headaches.       Objective:   Vitals:   08/06/17 1022  BP: (!) 144/82  Pulse: 84  Resp: 16  Temp: 98.4 F (36.9 C)  SpO2: 98%   BP Readings from Last 3 Encounters:  08/06/17 (!) 144/82  07/08/17 (!) 158/72  06/28/17 134/76   Wt Readings from Last 3 Encounters:  08/06/17 172 lb (78 kg)  07/08/17 168 lb (76.2 kg)  06/28/17 173 lb (78.5 kg)   Body mass index is 23.99 kg/m.   Physical Exam  Constitutional: He appears well-developed and well-nourished. No distress.  HENT:  Head: Normocephalic and atraumatic.  Cardiovascular: Normal rate and regular rhythm.  Pulmonary/Chest: Effort normal and breath sounds normal. No respiratory distress. He has no wheezes. He has no rales.  Musculoskeletal: He exhibits edema (2+ right lower extremity edema, 1+ left lower extremity edema).  Skin: Skin is warm and dry. Rash (Patchy erythema distal upper legs proximal to knees and forearms bilaterally, no blisters, papules, rashes not raised) noted. He is not diaphoretic.           Assessment & Plan:    See Problem List for Assessment and Plan of chronic medical problems.

## 2017-08-06 ENCOUNTER — Encounter: Payer: Self-pay | Admitting: Internal Medicine

## 2017-08-06 ENCOUNTER — Ambulatory Visit (INDEPENDENT_AMBULATORY_CARE_PROVIDER_SITE_OTHER): Payer: Medicare Other | Admitting: Internal Medicine

## 2017-08-06 DIAGNOSIS — I2510 Atherosclerotic heart disease of native coronary artery without angina pectoris: Secondary | ICD-10-CM | POA: Diagnosis not present

## 2017-08-06 DIAGNOSIS — R609 Edema, unspecified: Secondary | ICD-10-CM | POA: Diagnosis not present

## 2017-08-06 DIAGNOSIS — R0982 Postnasal drip: Secondary | ICD-10-CM | POA: Diagnosis not present

## 2017-08-06 DIAGNOSIS — R21 Rash and other nonspecific skin eruption: Secondary | ICD-10-CM | POA: Diagnosis not present

## 2017-08-06 MED ORDER — NITROGLYCERIN 0.4 MG SL SUBL: SUBLINGUAL_TABLET | SUBLINGUAL | 0 refills | Status: DC

## 2017-08-06 MED ORDER — TRIAMCINOLONE ACETONIDE 0.1 % EX CREA: 1.0000 "application " | TOPICAL_CREAM | Freq: Two times a day (BID) | CUTANEOUS | 0 refills | Status: DC

## 2017-08-06 MED ORDER — FLUTICASONE PROPIONATE 50 MCG/ACT NA SUSP: 2.0000 | Freq: Two times a day (BID) | NASAL | 8 refills | Status: DC

## 2017-08-06 NOTE — Assessment & Plan Note (Signed)
Having significant postnasal drip likely the cause of his intermittent cough and hoarseness Continue Flonase Start Claritin Denies GERD and EGD last year normal-no improvement with ranitidine If symptoms persist will need to see ENT

## 2017-08-06 NOTE — Patient Instructions (Addendum)
Try starting claritin.  Your flonase was renewed.    If your cough, hoarseness persists consider seeing ENT again.    Try the claritin and benadryl cream for the rash.  You can try the steroid cream twice a day for up to two weeks.

## 2017-08-06 NOTE — Assessment & Plan Note (Signed)
Rash upper thighs and forearms No obvious cause, associated with itching Trial of steroid cream, bendaryl cream Start claritin Consider seeing derm

## 2017-08-06 NOTE — Assessment & Plan Note (Signed)
Bilateral lower extremity, right more than left Has gotten worse Did require Lasix last month for fluid overload, elevated BNP, but currently not on any diuretics No shortness of breath We will hold off on adding Lasix-we will see Dr. Justin Mend next Stressed low-sodium diet-advised to start checking labels Elevate legs whenever sitting Encouraged using compression socks daily Will call if he develops any increased shortness of breath

## 2017-08-09 ENCOUNTER — Ambulatory Visit: Payer: Medicare Other | Admitting: Internal Medicine

## 2017-08-14 ENCOUNTER — Telehealth: Payer: Self-pay | Admitting: Emergency Medicine

## 2017-08-14 ENCOUNTER — Other Ambulatory Visit (INDEPENDENT_AMBULATORY_CARE_PROVIDER_SITE_OTHER): Payer: Medicare Other

## 2017-08-14 DIAGNOSIS — D485 Neoplasm of uncertain behavior of skin: Secondary | ICD-10-CM | POA: Diagnosis not present

## 2017-08-14 DIAGNOSIS — R21 Rash and other nonspecific skin eruption: Secondary | ICD-10-CM

## 2017-08-14 LAB — URINALYSIS
BILIRUBIN URINE: NEGATIVE
HGB URINE DIPSTICK: NEGATIVE
Ketones, ur: NEGATIVE
Leukocytes, UA: NEGATIVE
NITRITE: NEGATIVE
Specific Gravity, Urine: 1.015 (ref 1.000–1.030)
UROBILINOGEN UA: 0.2 (ref 0.0–1.0)
Urine Glucose: NEGATIVE
pH: 6.5 (ref 5.0–8.0)

## 2017-08-14 LAB — COMPREHENSIVE METABOLIC PANEL
ALK PHOS: 56 U/L (ref 39–117)
ALT: 18 U/L (ref 0–53)
AST: 30 U/L (ref 0–37)
Albumin: 3.9 g/dL (ref 3.5–5.2)
BILIRUBIN TOTAL: 0.8 mg/dL (ref 0.2–1.2)
BUN: 19 mg/dL (ref 6–23)
CO2: 26 meq/L (ref 19–32)
Calcium: 9.1 mg/dL (ref 8.4–10.5)
Chloride: 106 mEq/L (ref 96–112)
Creatinine, Ser: 1.68 mg/dL — ABNORMAL HIGH (ref 0.40–1.50)
GFR: 41.09 mL/min — ABNORMAL LOW (ref >60.00)
GLUCOSE: 93 mg/dL (ref 70–99)
POTASSIUM: 4.5 meq/L (ref 3.5–5.1)
Sodium: 138 mEq/L (ref 135–145)
TOTAL PROTEIN: 6.6 g/dL (ref 6.0–8.3)

## 2017-08-14 LAB — CBC WITH DIFFERENTIAL/PLATELET
BASOS ABS: 0.1 10*3/uL (ref 0.0–0.1)
Basophils Relative: 1 % (ref 0.0–3.0)
EOS PCT: 3 % (ref 0.0–5.0)
Eosinophils Absolute: 0.2 10*3/uL (ref 0.0–0.7)
HEMATOCRIT: 37.2 % — AB (ref 39.0–52.0)
HEMOGLOBIN: 12 g/dL — AB (ref 13.0–17.0)
Lymphocytes Relative: 18.5 % (ref 12.0–46.0)
Lymphs Abs: 0.9 10*3/uL (ref 0.7–4.0)
MCHC: 32.3 g/dL (ref 30.0–36.0)
MCV: 77.6 fl — ABNORMAL LOW (ref 78.0–100.0)
MONO ABS: 0.7 10*3/uL (ref 0.1–1.0)
Monocytes Relative: 13 % — ABNORMAL HIGH (ref 3.0–12.0)
NEUTROS ABS: 3.2 10*3/uL (ref 1.4–7.7)
Neutrophils Relative %: 64.5 % (ref 43.0–77.0)
Platelets: 230 10*3/uL (ref 150.0–400.0)
RBC: 4.8 Mil/uL (ref 4.22–5.81)
RDW: 16.2 % — AB (ref 11.5–15.5)
WBC: 5 10*3/uL (ref 4.0–10.5)

## 2017-08-14 NOTE — Telephone Encounter (Signed)
Pt came to lab with orders from Derm. Okay per Dr Quay Burow to put lab orders in.

## 2017-08-18 LAB — ANA: ANA: NEGATIVE

## 2017-08-19 NOTE — Telephone Encounter (Signed)
Spoke with pt to inform labs were faxed over to [facility].

## 2017-08-19 NOTE — Telephone Encounter (Signed)
Pt called in to check the status of his labs being sent over to [place], pt says that he had labs completed on Friday, [place]  Fax: [phone]    CB: [phone] - if needed.   Please advise.

## 2017-08-26 ENCOUNTER — Telehealth: Payer: Self-pay | Admitting: Internal Medicine

## 2017-08-26 NOTE — Telephone Encounter (Signed)
Copied from [place] [id number]. Topic: Quick Communication - See Telephone Encounter >> Aug 26, 2017 10:32 AM Ahmed Prima L wrote: CRM for notification. See Telephone encounter for: 08/26/17.  Altamese Dilling from [facility] called and stated that he had labs done for them at the [place] office. She is wanting to know if she could have someone call her back and give her the results.  Call back # [phone]

## 2017-08-26 NOTE — Telephone Encounter (Signed)
Called Kiesha back and she stated that she did receive labs from [place] and she thought she had not.

## 2017-09-03 ENCOUNTER — Ambulatory Visit (INDEPENDENT_AMBULATORY_CARE_PROVIDER_SITE_OTHER): Payer: Medicare Other | Admitting: Internal Medicine

## 2017-09-03 ENCOUNTER — Encounter: Payer: Self-pay | Admitting: Internal Medicine

## 2017-09-03 ENCOUNTER — Other Ambulatory Visit (INDEPENDENT_AMBULATORY_CARE_PROVIDER_SITE_OTHER): Payer: Medicare Other

## 2017-09-03 VITALS — BP 150/82 | HR 62 | Temp 98.5°F | Resp 16 | Ht 71.0 in | Wt 167.0 lb

## 2017-09-03 DIAGNOSIS — N183 Chronic kidney disease, stage 3 unspecified: Secondary | ICD-10-CM

## 2017-09-03 DIAGNOSIS — R21 Rash and other nonspecific skin eruption: Secondary | ICD-10-CM | POA: Diagnosis not present

## 2017-09-03 DIAGNOSIS — I10 Essential (primary) hypertension: Secondary | ICD-10-CM

## 2017-09-03 DIAGNOSIS — R350 Frequency of micturition: Secondary | ICD-10-CM

## 2017-09-03 DIAGNOSIS — M109 Gout, unspecified: Secondary | ICD-10-CM

## 2017-09-03 DIAGNOSIS — I2510 Atherosclerotic heart disease of native coronary artery without angina pectoris: Secondary | ICD-10-CM | POA: Diagnosis not present

## 2017-09-03 DIAGNOSIS — R609 Edema, unspecified: Secondary | ICD-10-CM

## 2017-09-03 HISTORY — DX: Gout, unspecified: M10.9

## 2017-09-03 LAB — URINALYSIS, ROUTINE W REFLEX MICROSCOPIC
BILIRUBIN URINE: NEGATIVE
HGB URINE DIPSTICK: NEGATIVE
KETONES UR: NEGATIVE
LEUKOCYTES UA: NEGATIVE
NITRITE: NEGATIVE
RBC / HPF: NONE SEEN (ref 0–?)
Specific Gravity, Urine: 1.02 (ref 1.000–1.030)
Urine Glucose: NEGATIVE
Urobilinogen, UA: 0.2 (ref 0.0–1.0)
WBC UA: NONE SEEN (ref 0–?)
pH: 5.5 (ref 5.0–8.0)

## 2017-09-03 MED ORDER — PREDNISONE 10 MG PO TABS: ORAL_TABLET | ORAL | 0 refills | Status: DC

## 2017-09-03 NOTE — Assessment & Plan Note (Signed)
Overall controlled Monitors it at home Following with cardiology No change in medications cmp up to date

## 2017-09-03 NOTE — Assessment & Plan Note (Signed)
Stable with most recent blood work Follows with [facility] - Dr Justin Mend

## 2017-09-03 NOTE — Patient Instructions (Addendum)
Give a urine sample.   Test(s) ordered today. Your results will be released to [place] (or called to you) after review, usually within 72hours after test completion. If any changes need to be made, you will be notified at that same time.   Medications reviewed and updated.  Changes include starting a prednisone taper.  Your prescription(s) have been submitted to your pharmacy. Please take as directed and contact our office if you believe you are having problem(s) with the medication(s).     Please followup in 6 months

## 2017-09-03 NOTE — Assessment & Plan Note (Signed)
Leg edema Varies Chronic, overall stable Continue compression socks

## 2017-09-03 NOTE — Assessment & Plan Note (Signed)
Has had increased urination  H/o UTI - 3 months ago UA, Ucx

## 2017-09-03 NOTE — Assessment & Plan Note (Signed)
Started a few days ago - improved with topical Aspercreme Still with pain and mild swelling Prednisone taper Call if no improvement

## 2017-09-03 NOTE — Assessment & Plan Note (Signed)
Saw Dr Nevada Crane No explanation for rash It is slightly better Discussed monitoring for now vs seeing a different dermatologist - he will monitor for now

## 2017-09-03 NOTE — Progress Notes (Signed)
Subjective:    Patient ID: [name], male    DOB: 22-Dec-1926, 84 y.o.   MRN: [id number]  HPI The patient is here for an acute visit/ follow up.  Left first toe pain started Saturday morning when he woke up.  It was swollen and red.  He applied Aspercreme with lidocaine.  His pain level is 4/10 now.  He has had gout in the past and thinks it is gout.   Rash:  He did see Dr Nevada Crane.  He had blood work done.  He was not sure what the rash was.  The rash is getting lighter and is not as widespread.    Has seen ENT.  Has chronic nasal drainage.  He has been using a nasal spray.  There has been no improvement.  He denies gerd.    Increased urination; he wears a pad at night and for years has never had a drop of urine in the pad,  Recently he has had urine in his pads nightly and it is full.  He has seen urology in the past.  He saw urology in June of this year and was found to have an infection and was treated with bactrim.    His calf and thigh muscles feel tight at times.  It affects him going up and down stairs.  It is in both legs.  He has not been exercising regularly recently.  He also feels some tightness in his upper back.    Hypertension, edema: He is taking his medication daily. He is compliant with a low sodium diet.  He has chronic leg edema that varies.  He is wearing the compression socks.  He denies chest pain, palpitations, shortness of breath and regular headaches. He is not currently exercising regularly.  He does monitor his blood pressure at home.     Medications and allergies reviewed with patient and updated if appropriate.  Patient Active Problem List   Diagnosis Date Noted  . Frequent urination 09/03/2017  . Postnasal drip 08/06/2017  . Rash and nonspecific skin eruption 08/06/2017  . Moderate aortic regurgitation 07/04/2017  . Diastolic dysfunction 62/95/2841  . Edema 06/29/2017  . DOE (dyspnea on exertion) 06/29/2017  . Sinus symptom 06/05/2017  .  Fatigue 05/18/2017  . Sleeping difficulty 05/18/2017  . Foot pain, bilateral 04/22/2017  . Trochanteric bursitis 01/10/2017  . Allergic reaction 11/14/2016  . Internal hemorrhoids 08/15/2016  . Loose stools 02/06/2016  . Dysphagia 02/06/2016  . Cephalalgia 07/14/2015  . Constipation 03/01/2015  . Claudication of left lower extremity ([place]) 03/02/2014  . CHB (complete heart block) ([place]) 05/03/2012  . Postoperative atrial fibrillation ([place]) 05/03/2012  . Pacemaker 04/10/2012  . 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 Symptomatic bradycardia 03/26/2012  . Coronary atherosclerosis 11/27/2011  . Presence of aortocoronary bypass graft 10/24/2011  . Pulmonary nodule   . CAD - CABG Oct 2013   . Chronic renal impairment, stage 3 (moderate) (HCC)   . Venous insufficiency of leg 06/05/2010  . SHOULDER PAIN, RIGHT, CHRONIC 10/14/2009  . B12 nutritional deficiency 08/23/2009  . Peripheral neuropathy 03/21/2009  . DEGENERATIVE DISC DISEASE, CERVICAL SPINE, W/RADICULOPATHY 09/21/2008  . Dyslipidemia 01/17/2007  . Essential hypertension 07/20/2006  . Prostate cancer ([place]) 07/20/2006    Current Outpatient Medications on File Prior to Visit  Medication Sig Dispense Refill  . acetaminophen (TYLENOL) 500 MG tablet Take 500 mg by mouth every 6 (six) hours as needed.    [name]  aspirin EC 81 MG tablet Take 1 tablet (81 mg total) by mouth daily.    . carboxymethylcellulose (REFRESH PLUS) 0.5 % SOLN Place 1 drop into both eyes daily as needed (dry eyes). 30 mL 1  . DiphenhydrAMINE HCl, Sleep, (ZZZQUIL PO) Take 1 Dose by mouth at bedtime as needed (sleep).    . ergocalciferol (VITAMIN D2) 50000 UNITS capsule Take 50,000 Units by mouth every 30 (thirty) days. 15 th of each month    . fluticasone (FLONASE) 50 MCG/ACT nasal spray Place 2 sprays into both nostrils 2 (two) times daily. 16 g 8  . folic acid (FOLVITE) 1 MG tablet TAKE 1 TABLET DAILY 90 tablet 3  . metoprolol succinate  (TOPROL-XL) 25 MG 24 hr tablet Take 1 tablet (25 mg total) by mouth at bedtime. 90 tablet 3  . nitroGLYCERIN (NITROSTAT) 0.4 MG SL tablet DISSOLVE 1 TABLET UNDER THE TONGUE EVERY 5 MINUTES AS NEEDED FOR CHEST PAIN (MAX OF 3 TABLETS) 100 tablet 0  . simvastatin (ZOCOR) 20 MG tablet TAKE 1 TABLET DAILY (ANNUAL APPOINTMENT DUE IN [place]. MUST SEE DOCTOR FOR REFILLS) 90 tablet 3  . triamcinolone cream (KENALOG) 0.1 % Apply 1 application topically 2 (two) times daily. 30 g 0  . vitamin B-12 (CYANOCOBALAMIN) 500 MCG tablet Take 500 mcg by mouth daily. Take one tablet every 3rd day    . vitamin C (ASCORBIC ACID) 500 MG tablet Take 1,000 mg by mouth daily.      No current facility-administered medications on file prior to visit.     Past Medical History:  Diagnosis Date  . Acute superficial venous thrombosis of lower extremity    a. RLE after CABG, neg dopp for DVT.  [name] Allergy   . Atrial fibrillation ([place])    a. Post-op from CABG, on amiodarone temporarily, d/c'd 12/2011  . CAD (coronary artery disease)    a. S/P stenting to mid RCA, prox PDA 06/1999. b. NSTEMI/CABG x 3 in 10/2011 with LIMA to LAD, SVG to PDA, and SVG to OM1.   . Chronic UTI    a. Followed by Dr. Risa Grill - colonized/asymptomatic - not on abx  . CKD (chronic kidney disease), stage III (HCC)    a. stable with a creatinine around 1.9-2.0 followed by nephrology.  . Dyslipidemia   . GERD (gastroesophageal reflux disease)   . Hypertension    well-controlled.  . Myocardial infarction ([place])   . Neck injury    a. C3-C4 and C4-C5 foraminal narrowing, severe  . Prostate cancer ([place])    a. 2001 s/p TURP.  . Pulmonary nodule    a. felt to be noncancerous.  Status post followup CT scan 4 mm and stable.  . Renal artery stenosis ([place])    a. 50% by cath 2001  . 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 cardia catherization  07-07-99  . CARDIAC  SURGERY  10/18/12   open heart surgery  . CATARACT EXTRACTION W/ INTRAOCULAR LENS  IMPLANT, BILATERAL  3/205, 06/2013   [facility]  . COLONOSCOPY  04/12/07  . CORONARY ARTERY BYPASS GRAFT  10/19/2011   Procedure: CORONARY ARTERY BYPASS GRAFTING (CABG);  Surgeon: Gaye Pollack, MD;  Location: [place];  Service: Open Heart Surgery;  Laterality: N/A;  times three using Left Internal Mammary Artery and Right Greater Saphenouse Vein Graft harvested Endoscopically  . edg  07-17-1994  . FLEXIBLE SIGMOIDOSCOPY  11-03-1997  . LEFT HEART CATHETERIZATION WITH CORONARY ANGIOGRAM N/A 10/16/2011   Procedure: LEFT HEART CATHETERIZATION WITH CORONARY ANGIOGRAM;  Surgeon: Peter M Martinique, MD;  Location: [facility] CATH LAB;  Service: Cardiovascular;  Laterality: N/A;  . LEFT HEART CATHETERIZATION WITH CORONARY/GRAFT ANGIOGRAM N/A 03/11/2013   Procedure: LEFT HEART CATHETERIZATION WITH [name];  Surgeon: Blane Ohara, MD;  Location: [facility] CATH LAB;  Service: Cardiovascular;  Laterality: N/A;  . lumbar spinal disk and neck fusion surgery    . PACEMAKER INSERTION  03/28/12   PPM implanted for mobitz II AV block  . peripheral vascular catherization  11-24-03  . PERMANENT PACEMAKER INSERTION N/A 03/28/2012   Procedure: PERMANENT PACEMAKER INSERTION;  Surgeon: Thompson Grayer, MD;  Location: [facility] CATH LAB;  Service: Cardiovascular;  Laterality: N/A;  . PROSTATECTOMY    . renal circulation  10-01-03  . s/p ptca    . stents     X 2  . stress cardiolite  05-04-05   spring 09-negative except for apical thinning, EF 68%    Social History   Socioeconomic History  . Marital status: Married    Spouse name: [name]  . Number of children: 2  . Years of education: Not on file  . Highest education level: Not on file  Occupational History  . 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 Financial resource strain: Not on file  . Food insecurity:    Worry: Not on file    Inability: Not on file  . Transportation needs:    Medical: Not on file    Non-medical: Not on file  Tobacco Use  . Smoking status: Never Smoker  . Smokeless tobacco: Never Used  Substance and Sexual Activity  . Alcohol use: Yes    Comment: Rarely  . Drug use: No  . Sexual activity: Not on file  Lifestyle  . Physical activity:    Days per week: Not on file    Minutes per session: Not on file  . Stress: Not on file  Relationships  . Social connections:    Talks on phone: Not on file    Gets together: Not on file    Attends religious service: Not on file    Active member of club or organization: Not on file    Attends meetings of clubs or organizations: Not on file    Relationship status: Not on file  Other Topics Concern  . Not on file  Social History Narrative   HSG, 1 year college.  married '52 - 3 years, divorced; married '56 - 3 years divorced; married '49-12 yrs - divorced; married '75 -. 1 son '57; 1 daughter - '53; 1 grandchild.  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  Very happily married.  End of life care: yes CPR, no long term mechanical ventilation, no heroic measures.    Family History  Problem Relation Age of Onset  . Coronary artery disease Father        died @ 63  . Other Mother        cerebral hemorrhage - died @ 109  . Arthritis Mother   . Cancer Brother        Bladder  . Prostate cancer Brother   . Nephritis Brother  died @ age 39.  . Other Brother        cerebral hemorrhage - died @ 31  . Heart disease Brother   . Breast cancer Other        niece x 2  . Diabetes Neg Hx   . Colon cancer Neg Hx     Review of Systems  Constitutional: Negative for chills and fever.  HENT: Positive for postnasal drip.   Respiratory: Positive for cough (drainage related). Negative for shortness of breath and  wheezing.   Cardiovascular: Positive for leg swelling (varies, wears compression socks). Negative for chest pain and palpitations.  Genitourinary: Positive for frequency. Negative for dysuria and hematuria.       No Odor or discoloration in urine  Neurological: Negative for light-headedness and headaches.       Objective:   Vitals:   09/03/17 1302  BP: (!) 150/82  Pulse: 62  Resp: 16  Temp: 98.5 F (36.9 C)  SpO2: 97%   BP Readings from Last 3 Encounters:  09/03/17 (!) 150/82  08/06/17 (!) 144/82  07/08/17 (!) 158/72   Wt Readings from Last 3 Encounters:  09/03/17 167 lb (75.8 kg)  08/06/17 172 lb (78 kg)  07/08/17 168 lb (76.2 kg)   Body mass index is 23.29 kg/m.   Physical Exam    Constitutional: Appears well-developed and well-nourished. No distress.  HENT:  Head: Normocephalic and atraumatic.  Neck: Neck supple. No tracheal deviation present. No thyromegaly present.  No cervical lymphadenopathy Cardiovascular: Normal rate, regular rhythm and normal heart sounds.   No murmur heard. No carotid bruit .  Mild b/l LE edema - wearing compression socks Pulmonary/Chest: Effort normal and breath sounds normal. No respiratory distress. No has no wheezes. No rales.  Msk:  Left first toe with mild erythema at distal toe, tenderness left first MTP joint - mild synovitis Skin: Skin is warm and dry. Not diaphoretic.  mild patchy erythema b/l knees Psychiatric: Normal mood and affect. Behavior is normal.       Assessment & Plan:    See Problem List for Assessment and Plan of chronic medical problems.

## 2017-09-04 LAB — URINE CULTURE
MICRO NUMBER:: 91023256
Result:: NO GROWTH
SPECIMEN QUALITY:: ADEQUATE

## 2017-09-05 ENCOUNTER — Ambulatory Visit: Payer: Medicare Other | Admitting: Internal Medicine

## 2017-09-05 ENCOUNTER — Encounter: Payer: Self-pay | Admitting: Internal Medicine

## 2017-09-11 ENCOUNTER — Ambulatory Visit (INDEPENDENT_AMBULATORY_CARE_PROVIDER_SITE_OTHER): Payer: Medicare Other

## 2017-09-11 DIAGNOSIS — Z23 Encounter for immunization: Secondary | ICD-10-CM

## 2017-09-26 DIAGNOSIS — M25551 Pain in right hip: Secondary | ICD-10-CM | POA: Insufficient documentation

## 2017-09-27 ENCOUNTER — Other Ambulatory Visit (HOSPITAL_COMMUNITY): Payer: Self-pay | Admitting: Urology

## 2017-09-27 DIAGNOSIS — C61 Malignant neoplasm of prostate: Secondary | ICD-10-CM

## 2017-10-01 ENCOUNTER — Encounter (HOSPITAL_COMMUNITY)
Admission: RE | Admit: 2017-10-01 | Discharge: 2017-10-01 | Disposition: A | Discharge status: Home / Self Care | Payer: Medicare Other | Source: Ambulatory Visit | Attending: Urology | Admitting: Urology

## 2017-10-01 DIAGNOSIS — C61 Malignant neoplasm of prostate: Secondary | ICD-10-CM | POA: Insufficient documentation

## 2017-10-01 MED ORDER — TECHNETIUM TC 99M MEDRONATE IV KIT
20.0000 | PACK | Freq: Once | INTRAVENOUS | Status: AC | PRN
  Administered 2017-10-01: 21.3 via INTRAVENOUS

## 2017-10-04 ENCOUNTER — Ambulatory Visit: Payer: Self-pay

## 2017-10-04 NOTE — Telephone Encounter (Signed)
Patient called in with c/o "shortness of breath." He says "it's been going on for a long time, but my urologist told me to call and talk to my primary. I also have leg swelling from my knees to my feet. I wear my tennis shoes and they feel tight. I get SOB just helping my wife make the bed, walking, none at rest." I asked about other symptoms, he denies. According to protocol, see PCP within 2 weeks, appointment scheduled for Monday, 10/07/17 at 1400 with Dr. Quay Burow, care advice given, patient verbalized understanding.   Reason for Disposition . [1] MILD longstanding difficulty breathing AND [2]  SAME as normal  Answer Assessment - Initial Assessment Questions 1. RESPIRATORY STATUS: "Describe your breathing?" (e.g., wheezing, shortness of breath, unable to speak, severe coughing)      Shortness of breath 2. ONSET: "When did this breathing problem begin?"      Going on for quite some time 3. PATTERN "Does the difficult breathing come and go, or has it been constant since it started?"      With exertion 4. SEVERITY: "How bad is your breathing?" (e.g., mild, moderate, severe)    - MILD: No SOB at rest, mild SOB with walking, speaks normally in sentences, can lay down, no retractions, pulse < 100.    - MODERATE: SOB at rest, SOB with minimal exertion and prefers to sit, cannot lie down flat, speaks in phrases, mild retractions, audible wheezing, pulse 100-120.    - SEVERE: Very SOB at rest, speaks in single words, struggling to breathe, sitting hunched forward, retractions, pulse > 120      Mild 5. RECURRENT SYMPTOM: "Have you had difficulty breathing before?" If so, ask: "When was the last time?" and "What happened that time?"      Yes 6. CARDIAC HISTORY: "Do you have any history of heart disease?" (e.g., heart attack, angina, bypass surgery, angioplasty)      Bypass surgery, pacemaker 7. LUNG HISTORY: "Do you have any history of lung disease?"  (e.g., pulmonary embolus, asthma, emphysema)      No 8. CAUSE: "What do you think is causing the breathing problem?"      I don't know 9. OTHER SYMPTOMS: "Do you have any other symptoms? (e.g., dizziness, runny nose, cough, chest pain, fever)     Swelling to lower legs to feet 10. PREGNANCY: "Is there any chance you are pregnant?" "When was your last menstrual period?"       N/A 11. TRAVEL: "Have you traveled out of the country in the last month?" (e.g., travel history, exposures)      No  Protocols used: BREATHING DIFFICULTY-A-AH

## 2017-10-05 NOTE — Progress Notes (Signed)
Subjective:    Patient ID: [name], male    DOB: November 19, 1926, 84 y.o.   MRN: [id number]  HPI The patient is here for an acute visit for SOB and leg edema, which are not new.  He follows with cardiology and last saw him in July.  He felt his fatigue was multifactorial (medication, age, heart disease, kidney disease) and did not feel any one intervention would make any significant improvement.  There were no changes to his cardiac medications.  He also follows with [facility]-Dr. Justin Mend.  He last was seen there 08/2017 - he did not have any swelling at that time.  He has increased leg edema and some swelling in his fingers.  He took lasix 40 mg daily for 3 days in June and did have have any swelling until recently.    SOB with exertion -making the bed or washing, walking one room to another,  no SOB at rest.  Has gotten worse in past few weeks.  He does find it is worse when he walks and is leaning forward which he realized he is doing due to his back pain.   Skin chagnes:  He continues to have redness on his distal thighs - he thinks it has gotten a little worse.  It does not itch.  He did see Dr Nevada Crane and he was not sure what it was.    Sinus issues:  He continues to have sinus issues.  He did see Dr Anthony Sar and did he gave him contradicting advise in his two different visits and he is still having the same symptoms.  He is interesting is seeing someone else.    He is doing to PT for his back pain - spinal stenosis.    Medications and allergies reviewed with patient and updated if appropriate.  Patient Active Problem List   Diagnosis Date Noted  . Frequent urination 09/03/2017  . Gout attack 09/03/2017  . Postnasal drip 08/06/2017  . Rash and nonspecific skin eruption 08/06/2017  . Moderate aortic regurgitation 07/04/2017  . Diastolic dysfunction 77/11/6577  . Edema 06/29/2017  . DOE (dyspnea on exertion) 06/29/2017  . Sinus symptom 06/05/2017  . Fatigue  05/18/2017  . Sleeping difficulty 05/18/2017  . Foot pain, bilateral 04/22/2017  . Trochanteric bursitis 01/10/2017  . Internal hemorrhoids 08/15/2016  . Loose stools 02/06/2016  . Dysphagia 02/06/2016  . Cephalalgia 07/14/2015  . Constipation 03/01/2015  . Claudication of left lower extremity ([place]) 03/02/2014  . CHB (complete heart block) ([place]) 05/03/2012  . Postoperative atrial fibrillation ([place]) 05/03/2012  . Pacemaker 04/10/2012  . 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 Coronary atherosclerosis 11/27/2011  . Presence of aortocoronary bypass graft 10/24/2011  . Pulmonary nodule   . CAD - CABG Oct 2013   . Chronic renal impairment, stage 3 (moderate) (HCC)   . Venous insufficiency of leg 06/05/2010  . SHOULDER PAIN, RIGHT, CHRONIC 10/14/2009  . B12 nutritional deficiency 08/23/2009  . Peripheral neuropathy 03/21/2009  . DEGENERATIVE DISC DISEASE, CERVICAL SPINE, W/RADICULOPATHY 09/21/2008  . Dyslipidemia 01/17/2007  . Essential hypertension 07/20/2006  . Prostate cancer ([place]) 07/20/2006    Current Outpatient Medications on File Prior to Visit  Medication Sig Dispense Refill  . acetaminophen (TYLENOL) 500 MG tablet Take 500 mg by mouth every 6 (six) hours as needed.    [name] aspirin EC 81 MG tablet Take 1 tablet (81 mg total) by mouth daily.    . carboxymethylcellulose (REFRESH  PLUS) 0.5 % SOLN Place 1 drop into both eyes daily as needed (dry eyes). 30 mL 1  . DiphenhydrAMINE HCl, Sleep, (ZZZQUIL PO) Take 1 Dose by mouth at bedtime as needed (sleep).    . ergocalciferol (VITAMIN D2) 50000 UNITS capsule Take 50,000 Units by mouth every 30 (thirty) days. 15 th of each month    . fluticasone (FLONASE) 50 MCG/ACT nasal spray Place 2 sprays into both nostrils 2 (two) times daily. 16 g 8  . folic acid (FOLVITE) 1 MG tablet TAKE 1 TABLET DAILY 90 tablet 3  . metoprolol succinate (TOPROL-XL) 25 MG 24 hr tablet Take 1 tablet (25 mg total) by mouth at bedtime. 90 tablet  3  . nitroGLYCERIN (NITROSTAT) 0.4 MG SL tablet DISSOLVE 1 TABLET UNDER THE TONGUE EVERY 5 MINUTES AS NEEDED FOR CHEST PAIN (MAX OF 3 TABLETS) 100 tablet 0  . simvastatin (ZOCOR) 20 MG tablet TAKE 1 TABLET DAILY (ANNUAL APPOINTMENT DUE IN [place]. MUST SEE DOCTOR FOR REFILLS) 90 tablet 3  . triamcinolone cream (KENALOG) 0.1 % Apply 1 application topically 2 (two) times daily. 30 g 0  . vitamin B-12 (CYANOCOBALAMIN) 500 MCG tablet Take 500 mcg by mouth daily. Take one tablet every 3rd day    . vitamin C (ASCORBIC ACID) 500 MG tablet Take 1,000 mg by mouth daily.     . mirabegron ER (MYRBETRIQ) 25 MG TB24 tablet Take 25 mg by mouth daily.     No current facility-administered medications on file prior to visit.     Past Medical History:  Diagnosis Date  . Acute superficial venous thrombosis of lower extremity    a. RLE after CABG, neg dopp for DVT.  [name] Allergy   . Atrial fibrillation ([place])    a. Post-op from CABG, on amiodarone temporarily, d/c'd 12/2011  . CAD (coronary artery disease)    a. S/P stenting to mid RCA, prox PDA 06/1999. b. NSTEMI/CABG x 3 in 10/2011 with LIMA to LAD, SVG to PDA, and SVG to OM1.   . Chronic UTI    a. Followed by Dr. Risa Grill - colonized/asymptomatic - not on abx  . CKD (chronic kidney disease), stage III (HCC)    a. stable with a creatinine around 1.9-2.0 followed by nephrology.  . Dyslipidemia   . GERD (gastroesophageal reflux disease)   . Hypertension    well-controlled.  . Myocardial infarction ([place])   . Neck injury    a. C3-C4 and C4-C5 foraminal narrowing, severe  . Prostate cancer ([place])    a. 2001 s/p TURP.  . Pulmonary nodule    a. felt to be noncancerous.  Status post followup CT scan 4 mm and stable.  . Renal artery stenosis ([place])    a. 50% by cath 2001  . 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 cardia catherization  07-07-99  . CARDIAC SURGERY   10/18/12   open heart surgery  . CATARACT EXTRACTION W/ INTRAOCULAR LENS  IMPLANT, BILATERAL  3/205, 06/2013   [facility]  . COLONOSCOPY  04/12/07  . CORONARY ARTERY BYPASS GRAFT  10/19/2011   Procedure: CORONARY ARTERY BYPASS GRAFTING (CABG);  Surgeon: Gaye Pollack, MD;  Location: [facility];  Service: Open Heart Surgery;  Laterality: N/A;  times three using Left Internal Mammary Artery and Right Greater Saphenouse Vein Graft harvested Endoscopically  . edg  07-17-1994  . FLEXIBLE SIGMOIDOSCOPY  11-03-1997  .  LEFT HEART CATHETERIZATION WITH CORONARY ANGIOGRAM N/A 10/16/2011   Procedure: LEFT HEART CATHETERIZATION WITH CORONARY ANGIOGRAM;  Surgeon: Peter M Martinique, MD;  Location[facility] C[facility] LAB;  Service: Cardiovascular;  Laterality: N/A;  . LEFT HEART CATHETERIZATION WITH CORONARY/GRAFT ANGIOGRAM N/A 03/11/2013   Procedure: LEFT HEART CATHETERIZATION WITH [name];  Surgeon: Blane Ohara, MD;  Location: [facility] CATH LAB;  Service: Cardiovascular;  Laterality: N/A;  . lumbar spinal disk and neck fusion surgery    . PACEMAKER INSERTION  03/28/12   PPM implanted for mobitz II AV block  . peripheral vascular catherization  11-24-03  . PERMANENT PACEMAKER INSERTION N/A 03/28/2012   Procedure: PERMANENT PACEMAKER INSERTION;  Surgeon: Thompson Grayer, MD;  Location: [facility] CATH LAB;  Service: Cardiovascular;  Laterality: N/A;  . PROSTATECTOMY    . renal circulation  10-01-03  . s/p ptca    . stents     X 2  . stress cardiolite  05-04-05   spring 09-negative except for apical thinning, EF 68%    Social History   Socioeconomic History  . Marital status: Married    Spouse name: [name]  . Number of children: 2  . Years of education: Not on file  . Highest education level: Not on file  Occupational History  . 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 Financial resource strain: Not on file  . Food insecurity:    Worry: Not on file    Inability: Not on file  . Transportation needs:    Medical: Not on file    Non-medical: Not on file  Tobacco Use  . Smoking status: Never Smoker  . Smokeless tobacco: Never Used  Substance and Sexual Activity  . Alcohol use: Yes    Comment: Rarely  . Drug use: No  . Sexual activity: Not on file  Lifestyle  . Physical activity:    Days per week: Not on file    Minutes per session: Not on file  . Stress: Not on file  Relationships  . Social connections:    Talks on phone: Not on file    Gets together: Not on file    Attends religious service: Not on file    Active member of club or organization: Not on file    Attends meetings of clubs or organizations: Not on file    Relationship status: Not on file  Other Topics Concern  . Not on file  Social History Narrative   HSG, 1 year college.  married '52 - 3 years, divorced; married '56 - 3 years divorced; married '65-12 yrs - divorced; married '75 -. 1 son '57; 1 daughter - '53; 1 grandchild.  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  Very happily married.  End of life care: yes CPR, no long term mechanical ventilation, no heroic measures.    Family History  Problem Relation Age of Onset  . Coronary artery disease Father        died @ 53  . Other Mother        cerebral hemorrhage - died @ 45  . Arthritis Mother   . Cancer Brother        Bladder  . Prostate cancer Brother   . Nephritis Brother  died @ age 67.  . Other Brother        cerebral hemorrhage - died @ 42  . Heart disease Brother   . Breast cancer Other        niece x 2  . Diabetes Neg Hx   . Colon cancer Neg Hx     Review of Systems  HENT: Positive for trouble swallowing.   Respiratory: Positive for shortness of breath.   Cardiovascular: Positive for leg swelling.  Gastrointestinal: Negative for nausea.       No gerd        Objective:   Vitals:   10/07/17 1351  BP: (!) 142/68  Pulse: 73  Resp: 18  Temp: 98.2 F (36.8 C)  SpO2: 97%   BP Readings from Last 3 Encounters:  10/07/17 (!) 142/68  09/03/17 (!) 150/82  08/06/17 (!) 144/82   Wt Readings from Last 3 Encounters:  10/07/17 174 lb 6.4 oz (79.1 kg)  09/03/17 167 lb (75.8 kg)  08/06/17 172 lb (78 kg)   Body mass index is 24.32 kg/m.   Physical Exam    Constitutional: Appears well-developed and well-nourished. No distress.  HENT:  Head: Normocephalic and atraumatic.  Neck: Neck supple. No tracheal deviation present. No thyromegaly present.  No cervical lymphadenopathy Cardiovascular: Normal rate, regular rhythm and normal heart sounds.   No murmur heard. No carotid bruit .  2 + pitting edema b/l LE 1/2 way up LE's Pulmonary/Chest: Effort normal and breath sounds normal. No respiratory distress. No has no wheezes. No rales.  Skin: Skin is warm and dry. Not diaphoretic.  Psychiatric: Normal mood and affect. Behavior is normal.       Assessment & Plan:    See Problem List for Assessment and Plan of chronic medical problems.

## 2017-10-07 ENCOUNTER — Ambulatory Visit (INDEPENDENT_AMBULATORY_CARE_PROVIDER_SITE_OTHER): Payer: Medicare Other | Admitting: Internal Medicine

## 2017-10-07 ENCOUNTER — Other Ambulatory Visit (INDEPENDENT_AMBULATORY_CARE_PROVIDER_SITE_OTHER): Payer: Medicare Other

## 2017-10-07 ENCOUNTER — Encounter: Payer: Self-pay | Admitting: Internal Medicine

## 2017-10-07 VITALS — BP 142/68 | HR 73 | Temp 98.2°F | Resp 18 | Ht 71.0 in | Wt 174.4 lb

## 2017-10-07 DIAGNOSIS — R0989 Other specified symptoms and signs involving the circulatory and respiratory systems: Secondary | ICD-10-CM

## 2017-10-07 DIAGNOSIS — R0609 Other forms of dyspnea: Secondary | ICD-10-CM

## 2017-10-07 DIAGNOSIS — R1314 Dysphagia, pharyngoesophageal phase: Secondary | ICD-10-CM

## 2017-10-07 DIAGNOSIS — R609 Edema, unspecified: Secondary | ICD-10-CM

## 2017-10-07 DIAGNOSIS — R239 Unspecified skin changes: Secondary | ICD-10-CM | POA: Insufficient documentation

## 2017-10-07 DIAGNOSIS — I2510 Atherosclerotic heart disease of native coronary artery without angina pectoris: Secondary | ICD-10-CM

## 2017-10-07 LAB — CBC WITH DIFFERENTIAL/PLATELET
Basophils Absolute: 0 10*3/uL (ref 0.0–0.1)
Basophils Relative: 0.9 % (ref 0.0–3.0)
EOS ABS: 0.1 10*3/uL (ref 0.0–0.7)
EOS PCT: 2.1 % (ref 0.0–5.0)
HCT: 35.5 % — ABNORMAL LOW (ref 39.0–52.0)
HEMOGLOBIN: 11.4 g/dL — AB (ref 13.0–17.0)
LYMPHS ABS: 1 10*3/uL (ref 0.7–4.0)
Lymphocytes Relative: 18 % (ref 12.0–46.0)
MCHC: 32.2 g/dL (ref 30.0–36.0)
MCV: 76.3 fl — ABNORMAL LOW (ref 78.0–100.0)
MONO ABS: 0.7 10*3/uL (ref 0.1–1.0)
Monocytes Relative: 13.2 % — ABNORMAL HIGH (ref 3.0–12.0)
Neutro Abs: 3.6 10*3/uL (ref 1.4–7.7)
Neutrophils Relative %: 65.8 % (ref 43.0–77.0)
Platelets: 285 10*3/uL (ref 150.0–400.0)
RBC: 4.65 Mil/uL (ref 4.22–5.81)
RDW: 15.9 % — AB (ref 11.5–15.5)
WBC: 5.5 10*3/uL (ref 4.0–10.5)

## 2017-10-07 LAB — COMPREHENSIVE METABOLIC PANEL
ALT: 23 U/L (ref 0–53)
AST: 42 U/L — AB (ref 0–37)
Albumin: 3.7 g/dL (ref 3.5–5.2)
Alkaline Phosphatase: 63 U/L (ref 39–117)
BUN: 24 mg/dL — AB (ref 6–23)
CHLORIDE: 107 meq/L (ref 96–112)
CO2: 26 mEq/L (ref 19–32)
Calcium: 8.7 mg/dL (ref 8.4–10.5)
Creatinine, Ser: 1.54 mg/dL — ABNORMAL HIGH (ref 0.40–1.50)
GFR: 45.41 mL/min — ABNORMAL LOW (ref >60.00)
GLUCOSE: 80 mg/dL (ref 70–99)
POTASSIUM: 4.4 meq/L (ref 3.5–5.1)
SODIUM: 138 meq/L (ref 135–145)
TOTAL PROTEIN: 6.4 g/dL (ref 6.0–8.3)
Total Bilirubin: 0.6 mg/dL (ref 0.2–1.2)

## 2017-10-07 LAB — BRAIN NATRIURETIC PEPTIDE: PRO B NATRI PEPTIDE: 439 pg/mL — AB (ref 0.0–100.0)

## 2017-10-07 MED ORDER — FUROSEMIDE 40 MG PO TABS: ORAL_TABLET | ORAL | 0 refills | Status: DC

## 2017-10-07 NOTE — Assessment & Plan Note (Signed)
Presbyesophagus vs from c-spine disease If symptoms persist will need to see GI

## 2017-10-07 NOTE — Assessment & Plan Note (Signed)
B/l LE also some swelling in hands Took lasix 40 mg daily x 3 days in June and swelling resolved and has not had swelling until recently Cbc, cmp, bnp today Has follow with with [facility] and cardio Start lasix 40 mg daily x 3 days, then once weekly as needed

## 2017-10-07 NOTE — Assessment & Plan Note (Signed)
DOE - none at rest ? Related to fluid overload, posture is contributing - he is doing PT and will work on his posture Lasix 40 mg daily x 3 days

## 2017-10-07 NOTE — Patient Instructions (Addendum)
  Tests ordered today. Your results will be released to [place] (or called to you) after review, usually within 72hours after test completion. If any changes need to be made, you will be notified at that same time.   Medications reviewed and updated.  Changes include :   Lasix 40 mg daily for 3 days and then once a week as needed.  Your prescription(s) have been submitted to your pharmacy. Please take as directed and contact our office if you believe you are having problem(s) with the medication(s).  A referral was ordered for ENT and Dermatology.    Please followup in March as scheduled.

## 2017-10-07 NOTE — Assessment & Plan Note (Signed)
Referred to second ENT for a second opinion

## 2017-10-07 NOTE — Assessment & Plan Note (Signed)
Redness on anterior distal thighs - no itch or pain,? Spreading Dr Nevada Crane was not sure of cause Will refer to second dermatologist

## 2017-10-08 ENCOUNTER — Telehealth: Payer: Self-pay | Admitting: Cardiovascular Disease

## 2017-10-08 NOTE — Telephone Encounter (Signed)
New message   Pt c/o swelling: STAT is pt has developed SOB within 24 hours  1) How much weight have you gained and in what time span? 1 lb, 4 to 5 days   2) If swelling, where is the swelling located? Ankles up to knees and hands   3) Are you currently taking a fluid pill? Yes   4) Are you currently SOB? Patient states that he is sob when walking.  5) Do you have a log of your daily weights (if so, list)? No   6) Have you gained 3 pounds in a day or 5 pounds in a week? No   7) Have you traveled recently?no   Patient states that he has fluid in his  lungs and an enlarged heart. Please contact patient to discuss.

## 2017-10-08 NOTE — Telephone Encounter (Signed)
[name] reports a weight gain of 2-3 pounds in several days. He went to his PCP and was given Lasix 40 mg to take for 3 days. Today is day one.  Last week, he got up out of bed and had a sharp pain from his right kidney all the way down his leg. He went to the kidney doctor and lots of tests were done to rule out a return of his cancer. He was told he had fluid on his lungs and an enlarged heart and needed to see his cardiologist.   Scheduled the patient for evaluation with Dr. Burt Knack this Friday. Instructed him to continue Lasix as directed and to call prior to that time if symptoms worsen. He was grateful for assistance.

## 2017-10-09 ENCOUNTER — Encounter: Payer: Self-pay | Admitting: Internal Medicine

## 2017-10-09 DIAGNOSIS — M48061 Spinal stenosis, lumbar region without neurogenic claudication: Secondary | ICD-10-CM | POA: Insufficient documentation

## 2017-10-09 HISTORY — DX: Spinal stenosis, lumbar region without neurogenic claudication: M48.061

## 2017-10-11 ENCOUNTER — Encounter: Payer: Self-pay | Admitting: Cardiovascular Disease

## 2017-10-11 ENCOUNTER — Telehealth: Payer: Self-pay

## 2017-10-11 ENCOUNTER — Ambulatory Visit (INDEPENDENT_AMBULATORY_CARE_PROVIDER_SITE_OTHER): Payer: Medicare Other | Admitting: Cardiovascular Disease

## 2017-10-11 ENCOUNTER — Ambulatory Visit: Payer: Medicare Other | Admitting: Cardiovascular Disease

## 2017-10-11 VITALS — BP 112/58 | HR 81 | Ht 71.0 in | Wt 164.2 lb

## 2017-10-11 DIAGNOSIS — N183 Chronic kidney disease, stage 3 unspecified: Secondary | ICD-10-CM

## 2017-10-11 DIAGNOSIS — I10 Essential (primary) hypertension: Secondary | ICD-10-CM

## 2017-10-11 DIAGNOSIS — I2510 Atherosclerotic heart disease of native coronary artery without angina pectoris: Secondary | ICD-10-CM

## 2017-10-11 DIAGNOSIS — I5033 Acute on chronic diastolic (congestive) heart failure: Secondary | ICD-10-CM

## 2017-10-11 NOTE — Patient Instructions (Addendum)
Medication Instructions:  1) we are waiting to speak with Dr. Justin Mend (a message has been left at his office). If okay with him, we will START LASIX 40mg  three times a week.  Labwork: We will monitor labs more frequently if we start Lasix.  Testing/Procedures: Dr. Burt Knack recommends you have a NUCLEAR STRESS TEST.   Follow-Up: Your provider recommends that you schedule a follow-up appointment in [place] with Richardson Dopp.  Any Other Special Instructions Will Be Listed Below (If Applicable).

## 2017-10-11 NOTE — Telephone Encounter (Signed)
Dr. Burt Knack recommended the patient start Lasix 40 mg three times weekly at [place] today. The patient did not want to start until OK'd by Nephrology (Dr. Justin Mend).  Left message for Dr. Jason Nest assistant to call back to discuss medication management.

## 2017-10-11 NOTE — Progress Notes (Signed)
Cardiology Office Note:    Date:  10/11/2017   ID:  [name], DOB 1926-04-08, MRN [id number]  PCP:  [name], MD  Cardiologist:  [name], MD  Electrophysiologist:  None   Referring MD: [name], MD   Chief Complaint  Patient presents with  . Follow-up    CAD  . Shortness of Breath    History of Present Illness:    [name] is a 84 y.o. male with a hx of coronary artery disease status post CABG in 2013.  He initially presented with non-ST elevation MI.  The patient is status post permanent pacemaker placement in 2014.  Cardiac catheterization in 2015 performed after an abnormal nuclear stress test demonstrated continued patency of the LIMA to LAD graft but early graft failure of all saphenous vein grafts.  Medical therapy was recommended.  The patient is also followed for chronic kidney disease and hypertension.  The patient is here with his wife today.  He complains of shortness of breath with activity.  States that he feels fine at rest but when he gets up and starts walking he feels short of breath.  He did have some swelling in his ankles and his wrists.  He was prescribed a few days of furosemide by his primary care physician and his swelling resolved with this.  He has not had orthopnea or PND.  He has not had chest pain or pressure.  He does complain of worsening generalized fatigue.  Past Medical History:  Diagnosis Date  . Acute superficial venous thrombosis of lower extremity    a. RLE after CABG, neg dopp for DVT.  [name] Allergy   . Atrial fibrillation ([place])    a. Post-op from CABG, on amiodarone temporarily, d/c'd 12/2011  . CAD (coronary artery disease)    a. S/P stenting to mid RCA, prox PDA 06/1999. b. NSTEMI/CABG x 3 in 10/2011 with LIMA to LAD, SVG to PDA, and SVG to OM1.   . Chronic UTI    a. Followed by Dr. Risa Grill - colonized/asymptomatic - not on abx  . CKD (chronic kidney disease), stage III (HCC)    a. stable with a creatinine  around 1.9-2.0 followed by nephrology.  . Dyslipidemia   . GERD (gastroesophageal reflux disease)   . Hypertension    well-controlled.  . Myocardial infarction ([place])   . Neck injury    a. C3-C4 and C4-C5 foraminal narrowing, severe  . Prostate cancer ([place])    a. 2001 s/p TURP.  . Pulmonary nodule    a. felt to be noncancerous.  Status post followup CT scan 4 mm and stable.  . Renal artery stenosis ([place])    a. 50% by cath 2001  . 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 cardia catherization  07-07-99  . CARDIAC SURGERY  10/18/12   open heart surgery  . CATARACT EXTRACTION W/ INTRAOCULAR LENS  IMPLANT, BILATERAL  3/205, 06/2013   [facility]  . COLONOSCOPY  04/12/07  . CORONARY ARTERY BYPASS GRAFT  10/19/2011   Procedure: CORONARY ARTERY BYPASS GRAFTING (CABG);  Surgeon: Gaye Pollack, MD;  Location: [facility];  Service: Open Heart Surgery;  Laterality: N/A;  times three using Left Internal Mammary Artery and Right Greater Saphenouse Vein Graft harvested Endoscopically  . edg  07-17-1994  . FLEXIBLE SIGMOIDOSCOPY  11-03-1997  . LEFT HEART CATHETERIZATION WITH CORONARY ANGIOGRAM  N/A 10/16/2011   Procedure: LEFT HEART CATHETERIZATION WITH CORONARY ANGIOGRAM;  Surgeon: Peter M Martinique, MD;  Location: [facility] CATH LAB;  Service: Cardiovascular;  Laterality: N/A;  . LEFT HEART CATHETERIZATION WITH CORONARY/GRAFT ANGIOGRAM N/A 03/11/2013   Procedure: LEFT HEART CATHETERIZATION WITH [name];  Surgeon: Blane Ohara, MD;  Location: [facility] CATH LAB;  Service: Cardiovascular;  Laterality: N/A;  . lumbar spinal disk and neck fusion surgery    . PACEMAKER INSERTION  03/28/12   PPM implanted for mobitz II AV block  . peripheral vascular catherization  11-24-03  . PERMANENT PACEMAKER INSERTION N/A 03/28/2012   Procedure: PERMANENT PACEMAKER INSERTION;  Surgeon: Thompson Grayer, MD;  Location: [facility] CATH LAB;   Service: Cardiovascular;  Laterality: N/A;  . PROSTATECTOMY    . renal circulation  10-01-03  . s/p ptca    . stents     X 2  . stress cardiolite  05-04-05   spring 09-negative except for apical thinning, EF 68%    Current Medications: Current Meds  Medication Sig  . acetaminophen (TYLENOL) 500 MG tablet Take 500 mg by mouth every 6 (six) hours as needed.  [name] aspirin EC 81 MG tablet Take 1 tablet (81 mg total) by mouth daily.  . carboxymethylcellulose (REFRESH PLUS) 0.5 % SOLN Place 1 drop into both eyes daily as needed (dry eyes).  . DiphenhydrAMINE HCl, Sleep, (ZZZQUIL PO) Take 1 Dose by mouth at bedtime as needed (sleep).  . ergocalciferol (VITAMIN D2) 50000 UNITS capsule Take 50,000 Units by mouth every 30 (thirty) days. 15 th of each month  . fluticasone (FLONASE) 50 MCG/ACT nasal spray Place 2 sprays into both nostrils 2 (two) times daily.  . folic acid (FOLVITE) 1 MG tablet TAKE 1 TABLET DAILY  . furosemide (LASIX) 40 MG tablet Take 1 tablet daily for three days, then take once a week as needed (Patient taking differently: 40 mg as needed. Take 1 tablet daily for three days, then take once a week as needed)  . ipratropium (ATROVENT) 0.06 % nasal spray Place 1 spray into both nostrils daily.  . metoprolol succinate (TOPROL-XL) 25 MG 24 hr tablet Take 1 tablet (25 mg total) by mouth at bedtime.  . nitroGLYCERIN (NITROSTAT) 0.4 MG SL tablet DISSOLVE 1 TABLET UNDER THE TONGUE EVERY 5 MINUTES AS NEEDED FOR CHEST PAIN (MAX OF 3 TABLETS)  . simvastatin (ZOCOR) 20 MG tablet TAKE 1 TABLET DAILY (ANNUAL APPOINTMENT DUE IN [place]. MUST SEE DOCTOR FOR REFILLS)  . triamcinolone cream (KENALOG) 0.1 % Apply 1 application topically 2 (two) times daily.  . vitamin B-12 (CYANOCOBALAMIN) 500 MCG tablet Take 500 mcg by mouth daily. Take one tablet every 3rd day  . vitamin C (ASCORBIC ACID) 500 MG tablet Take 1,000 mg by mouth daily.      Allergies:   Aspirin; Amoxicillin; Atarax [hydroxyzine hcl];  Cephalexin; Ciprofloxacin; Clindamycin; Clobetasol; Codeine; Fluarix [flu virus vaccine]; Haemophilus influenzae; Hydrocodone; Hydrocodone-acetaminophen; Hydroxyzine; Latex; Lisinopril; Macrobid [nitrofurantoin macrocrystal]; Niacin; Niacin-lovastatin er; Niacin-lovastatin er; Nitrofurantoin; Vibramycin  [doxycycline]; Bactrim [sulfamethoxazole-trimethoprim]; and Colchicine   Social History   Socioeconomic History  . Marital status: Married    Spouse name: [name]  . Number of children: 2  . Years of education: Not on file  . Highest education level: Not on file  Occupational History  . 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 Financial resource strain: Not on file  . Food insecurity:    Worry: Not on file    Inability: Not on file  . Transportation needs:    Medical: Not on file    Non-medical: Not on file  Tobacco Use  . Smoking status: Never Smoker  . Smokeless tobacco: Never Used  Substance and Sexual Activity  . Alcohol use: Yes    Comment: Rarely  . Drug use: No  . Sexual activity: Not on file  Lifestyle  . Physical activity:    Days per week: Not on file    Minutes per session: Not on file  . Stress: Not on file  Relationships  . Social connections:    Talks on phone: Not on file    Gets together: Not on file    Attends religious service: Not on file    Active member of club or organization: Not on file    Attends meetings of clubs or organizations: Not on file    Relationship status: Not on file  Other Topics Concern  . Not on file  Social History Narrative   HSG, 1 year college.  married '52 - 3 years, divorced; married '56 - 3 years divorced; married '4-12 yrs - divorced; married '75 -. 1 son '57; 1 daughter - '53; 1 grandchild.  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  Very happily married.  End of life care: yes CPR, no long term mechanical ventilation, no heroic measures.     Family History: The patient's family history includes Arthritis in his mother; Breast cancer in his other; Cancer in his brother; Coronary artery disease in his father; Heart disease in his brother; Nephritis in his brother; Other in his brother and mother; Prostate cancer in his brother. There is no history of Diabetes or Colon cancer.  ROS:   Please see the history of present illness.    Back pain, muscle pain, rash, balance problems.  All other systems reviewed and are negative.  EKGs/Labs/Other Studies Reviewed:    The following studies were reviewed today: Echocardiogram 07/02/2017: Study Conclusions  - Left ventricle: The cavity size was normal. There was mild   concentric hypertrophy. Systolic function was normal. The   estimated ejection fraction was in the range of 55% to 60%. Wall   motion was normal; there were no regional wall motion   abnormalities. Doppler parameters are consistent with abnormal   left ventricular relaxation (grade 1 diastolic dysfunction).   Doppler parameters are consistent with high ventricular filling   pressure. - Aortic valve: Valve mobility was restricted. There was very mild   stenosis. There was moderate regurgitation. Peak velocity (S):   207.57 cm/s. Mean gradient (S): 9 mm Hg. Regurgitation pressure   half-time: 481 ms. - Mitral valve: Mildly calcified annulus. Transvalvular velocity   was within the normal range. There was no evidence for stenosis.   There was mild regurgitation. - Left atrium: The atrium was moderately dilated. - Right ventricle: The cavity size was normal. Wall thickness was   normal. Systolic function was normal. - Atrial septum: No defect or patent foramen ovale was identified   by color flow Doppler. - Tricuspid valve: There was mild-moderate regurgitation. - Pulmonary arteries: Systolic pressure  was within the normal   range. PA peak pressure: 39 mm Hg (S).  EKG:  EKG is not ordered today.    Recent Labs: 05/17/2017: TSH 1.50  10/07/2017: ALT 23; BUN 24; Creatinine, Ser 1.54; Hemoglobin 11.4; Platelets 285.0; Potassium 4.4; Pro B Natriuretic peptide (BNP) 439.0; Sodium 138  Recent Lipid Panel    Component Value Date/Time   CHOL 122 09/04/2016 1123   TRIG 59.0 09/04/2016 1123   TRIG 107 01/16/2006 1338   HDL 40.40 09/04/2016 1123   CHOLHDL 3 09/04/2016 1123   VLDL 11.8 09/04/2016 1123   LDLCALC 70 09/04/2016 1123    Physical Exam:    VS:  BP (!) 112/58   Pulse 81   Ht 5\' 11"  (1.803 m)   Wt 164 lb 3.2 oz (74.5 kg)   SpO2 98%   BMI 22.90 kg/m     Wt Readings from Last 3 Encounters:  10/11/17 164 lb 3.2 oz (74.5 kg)  10/07/17 174 lb 6.4 oz (79.1 kg)  09/03/17 167 lb (75.8 kg)     GEN: Well nourished, well developed elderly male in no acute distress HEENT: Normal NECK: No JVD; No carotid bruits LYMPHATICS: No lymphadenopathy CARDIAC: RRR, soft systolic murmur at the RUSB RESPIRATORY:  Clear to auscultation without rales, wheezing or rhonchi  ABDOMEN: Soft, non-tender, non-distended MUSCULOSKELETAL:  No edema; No deformity  SKIN: Warm and dry NEUROLOGIC:  Alert and oriented x 3 PSYCHIATRIC:  Normal affect   ASSESSMENT:    1. Acute on chronic diastolic (congestive) heart failure ([place])   2. CAD in native artery   3. Essential hypertension   4. CKD (chronic kidney disease) stage 3, GFR 30-59 ml/min (HCC)    PLAN:    In order of problems listed above:  1. The patient has edema, worsening shortness of breath, and elevated BNP.  His clinical findings are consistent with acute on chronic diastolic heart failure.  He appears euvolemic after taking furosemide.  There are concerns about maintenance diuretics in the setting of his chronic kidney disease.  I have recommended furosemide 40 mg 3 days weekly, but the patient would like me to check with Dr. Justin Mend to make  sure he is in agreement with this approach in the setting of his kidney disease.  Of also recommended a Myoview stress test to evaluate for progressive ischemia as a cause of his worsening symptoms.  He had an abnormal stress study of intermediate risk back in 2015.  As long as there are no significant changes I would be inclined to continue with medical treatment.  I would reserve consideration of cardiac catheterization for a high risk study. 2. See discussion above, will check Myoview stress test.  Continue same medications for now. 3. Blood pressure is well controlled on current regimen. 4. Renal function appears to be stable from review of recent labs.  Followed closely by Dr. Justin Mend.   Medication Adjustments/Labs and Tests Ordered: Current medicines are reviewed at length with the patient today.  Concerns regarding medicines are outlined above.  Orders Placed This Encounter  Procedures  . MYOCARDIAL PERFUSION IMAGING   No orders of the defined types were placed in this encounter.   Patient Instructions  Medication Instructions:  1) we are waiting to speak with Dr. Justin Mend (a message has been left at his office). If okay with him, we will START LASIX 40mg  three times a week.  Labwork: We will monitor labs more frequently if we start Lasix.  Testing/Procedures: Dr. Burt Knack recommends you have a NUCLEAR STRESS TEST.   Follow-Up: Your provider recommends that you schedule a follow-up appointment in [place] with Richardson Dopp.  Any Other Special Instructions Will Be  Listed Below (If Applicable).     Signed, [name], MD  10/11/2017 5:42 PM    [facility]

## 2017-10-15 ENCOUNTER — Ambulatory Visit (INDEPENDENT_AMBULATORY_CARE_PROVIDER_SITE_OTHER): Payer: Medicare Other | Admitting: *Deleted

## 2017-10-15 DIAGNOSIS — R001 Bradycardia, unspecified: Secondary | ICD-10-CM

## 2017-10-15 NOTE — Telephone Encounter (Signed)
°  Patient calling back for clarification on Lasix and stress test questions

## 2017-10-15 NOTE — Telephone Encounter (Signed)
Spoke with the patient, he asked if Dr. Jason Nest office had called back, advised the patient that Valetta Fuller would call when they do. He brought up concerns about his Exercise Myoview and they he would be unable to walk for more than a few steps. Sending to [place].

## 2017-10-16 NOTE — Progress Notes (Signed)
Remote pacemaker transmission.   

## 2017-10-16 NOTE — Telephone Encounter (Signed)
Left message for Dr. Jason Nest office to call back.

## 2017-10-18 ENCOUNTER — Ambulatory Visit: Payer: Medicare Other | Admitting: Cardiology

## 2017-10-18 MED ORDER — FUROSEMIDE 40 MG PO TABS: 40.0000 mg | ORAL_TABLET | ORAL | 11 refills | Status: DC

## 2017-10-18 NOTE — Telephone Encounter (Signed)
[name] called to follow-up on furosemide. Informed him messages have been left with [name] assistant but I have received no call back. [name] reports he spoke with [name] assistant yesterday and was told Dr. Justin Mend has OK'd him to use Lasix and he is now comfortable.  Lasix 40 mg three times weekly called in to pharmacy of choice. BMET scheduled 10/28.  He also asked about stress test next week. Dr. Burt Knack ordered stress [name] and [name] is concerned he may not walk enough for his HR to hit target. Reiterated to the patient if he cannot walk or get his HR up, the test will be changed to [place]. He was grateful for call and agrees with treatment plan.

## 2017-10-22 ENCOUNTER — Ambulatory Visit
Admission: RE | Admit: 2017-10-22 | Discharge: 2017-10-22 | Disposition: A | Discharge status: Home / Self Care | Payer: Medicare Other | Source: Ambulatory Visit | Attending: Otolaryngology | Admitting: Otolaryngology

## 2017-10-22 ENCOUNTER — Telehealth (HOSPITAL_COMMUNITY): Payer: Self-pay

## 2017-10-22 ENCOUNTER — Other Ambulatory Visit: Payer: Self-pay | Admitting: Otolaryngology

## 2017-10-22 DIAGNOSIS — J0120 Acute ethmoidal sinusitis, unspecified: Secondary | ICD-10-CM

## 2017-10-22 DIAGNOSIS — J320 Chronic maxillary sinusitis: Secondary | ICD-10-CM

## 2017-10-22 NOTE — Telephone Encounter (Signed)
Pt instructions given and understood. He stated that he would be here for his stress test.S.Jimmye Norman EMTP

## 2017-10-24 ENCOUNTER — Ambulatory Visit (HOSPITAL_COMMUNITY): Payer: Medicare Other | Attending: Internal Medicine

## 2017-10-24 ENCOUNTER — Encounter: Payer: Self-pay | Admitting: Cardiology

## 2017-10-24 DIAGNOSIS — I2510 Atherosclerotic heart disease of native coronary artery without angina pectoris: Secondary | ICD-10-CM | POA: Diagnosis present

## 2017-10-24 DIAGNOSIS — I5033 Acute on chronic diastolic (congestive) heart failure: Secondary | ICD-10-CM | POA: Insufficient documentation

## 2017-10-24 LAB — MYOCARDIAL PERFUSION IMAGING
CHL CUP NUCLEAR SRS: 4
CSEPHR: 78 %
Estimated workload: 6.2 METS
Exercise duration (min): 4 min
LV sys vol: 51 mL
LVDIAVOL: 116 mL (ref 62–150)
MPHR: 131 {beats}/min
Peak HR: 102 {beats}/min
RPE: 19
Rest HR: 69 {beats}/min
SDS: 6
SSS: 11
TID: 0.96

## 2017-10-24 MED ORDER — TECHNETIUM TC 99M TETROFOSMIN IV KIT
32.8000 | PACK | Freq: Once | INTRAVENOUS | Status: AC | PRN
  Administered 2017-10-24: 32.8 via INTRAVENOUS
  Filled 2017-10-24: qty 33

## 2017-10-24 MED ORDER — TECHNETIUM TC 99M TETROFOSMIN IV KIT
10.5000 | PACK | Freq: Once | INTRAVENOUS | Status: AC | PRN
  Administered 2017-10-24: 10.5 via INTRAVENOUS
  Filled 2017-10-24: qty 11

## 2017-10-24 MED ORDER — REGADENOSON 0.4 MG/5ML IV SOLN
0.4000 mg | Freq: Once | INTRAVENOUS | Status: AC
  Administered 2017-10-24: 0.4 mg via INTRAVENOUS

## 2017-11-04 ENCOUNTER — Other Ambulatory Visit: Payer: Medicare Other | Admitting: *Deleted

## 2017-11-04 ENCOUNTER — Encounter: Payer: Self-pay | Admitting: Internal Medicine

## 2017-11-04 ENCOUNTER — Ambulatory Visit (INDEPENDENT_AMBULATORY_CARE_PROVIDER_SITE_OTHER): Payer: Medicare Other | Admitting: Internal Medicine

## 2017-11-04 VITALS — BP 144/62 | HR 73 | Temp 98.0°F | Resp 16 | Ht 71.0 in | Wt 168.4 lb

## 2017-11-04 DIAGNOSIS — I10 Essential (primary) hypertension: Secondary | ICD-10-CM

## 2017-11-04 DIAGNOSIS — I2510 Atherosclerotic heart disease of native coronary artery without angina pectoris: Secondary | ICD-10-CM | POA: Diagnosis not present

## 2017-11-04 DIAGNOSIS — R5383 Other fatigue: Secondary | ICD-10-CM

## 2017-11-04 DIAGNOSIS — L819 Disorder of pigmentation, unspecified: Secondary | ICD-10-CM | POA: Insufficient documentation

## 2017-11-04 HISTORY — DX: Disorder of pigmentation, unspecified: L81.9

## 2017-11-04 LAB — BASIC METABOLIC PANEL
BUN/Creatinine Ratio: 17 (ref 10–24)
BUN: 29 mg/dL — ABNORMAL HIGH (ref 8–27)
CHLORIDE: 102 mmol/L (ref 96–106)
CO2: 22 mmol/L (ref 20–29)
Calcium: 9 mg/dL (ref 8.6–10.2)
Creatinine, Ser: 1.69 mg/dL — ABNORMAL HIGH (ref 0.76–1.27)
GFR, EST AFRICAN AMERICAN: 41 mL/min/{1.73_m2} — AB (ref >59)
GFR, EST NON AFRICAN AMERICAN: 35 mL/min/{1.73_m2} — AB (ref >59)
Glucose: 86 mg/dL (ref 65–99)
Potassium: 4.5 mmol/L (ref 3.5–5.2)
SODIUM: 140 mmol/L (ref 134–144)

## 2017-11-04 NOTE — Progress Notes (Signed)
Subjective:    Patient ID: [name], male    DOB: 27-Jun-1926, 84 y.o.   MRN: [id number]  HPI The patient is here for an acute visit.  He continues to have multiple symptoms and wonders if he has Addison's disease.    Itching down arms and hand.  Hands sensitive on backs of hands.    Rash: on abdomen, legs.  Brighter at times. It has spread to now his abdomen - previously it was just on the legs.    Instant change in energy.  He can be exercising and feel fine and a short time after stopping will have sudden fatigue.  He can have the same thing with walking across the room.   He also states:  Fatigue, dec appetite, weight loss, darkening of skin that is intermittent. Funny feelings in abdomen, muscle and jont pain, irritable, depressed, short tempered, occastional lightheaded  Swelling in hands and feet.  This comes and goes and varies.  He is taking the lasix.     He is more unbalanced.      Medications and allergies reviewed with patient and updated if appropriate.  Patient Active Problem List   Diagnosis Date Noted  . Abnormal appearance of skin 10/07/2017  . Frequent urination 09/03/2017  . Gout attack 09/03/2017  . Postnasal drip 08/06/2017  . Rash and nonspecific skin eruption 08/06/2017  . Moderate aortic regurgitation 07/04/2017  . Diastolic dysfunction 38/93/7342  . Edema 06/29/2017  . DOE (dyspnea on exertion) 06/29/2017  . Sinus symptom 06/05/2017  . Fatigue 05/18/2017  . Sleeping difficulty 05/18/2017  . Foot pain, bilateral 04/22/2017  . Trochanteric bursitis 01/10/2017  . Internal hemorrhoids 08/15/2016  . Loose stools 02/06/2016  . Dysphagia 02/06/2016  . Cephalalgia 07/14/2015  . Constipation 03/01/2015  . Claudication of left lower extremity ([place]) 03/02/2014  . CHB (complete heart block) ([place]) 05/03/2012  . Postoperative atrial fibrillation ([place]) 05/03/2012  . Pacemaker 04/10/2012  . AV block, 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  03/26/2012  . Coronary atherosclerosis 11/27/2011  . Presence of aortocoronary bypass graft 10/24/2011  . Pulmonary nodule   . CAD - CABG Oct 2013   . Chronic renal impairment, stage 3 (moderate) (HCC)   . Venous insufficiency of leg 06/05/2010  . SHOULDER PAIN, RIGHT, CHRONIC 10/14/2009  . B12 nutritional deficiency 08/23/2009  . Peripheral neuropathy 03/21/2009  . DEGENERATIVE DISC DISEASE, CERVICAL SPINE, W/RADICULOPATHY 09/21/2008  . Dyslipidemia 01/17/2007  . Essential hypertension 07/20/2006  . Prostate cancer ([place]) 07/20/2006    Current Outpatient Medications on File Prior to Visit  Medication Sig Dispense Refill  . acetaminophen (TYLENOL) 500 MG tablet Take 500 mg by mouth every 6 (six) hours as needed.    [name] aspirin EC 81 MG tablet Take 1 tablet (81 mg total) by mouth daily.    . carboxymethylcellulose (REFRESH PLUS) 0.5 % SOLN Place 1 drop into both eyes daily as needed (dry eyes). 30 mL 1  . DiphenhydrAMINE HCl, Sleep, (ZZZQUIL PO) Take 1 Dose by mouth at bedtime as needed (sleep).    . ergocalciferol (VITAMIN D2) 50000 UNITS capsule Take 50,000 Units by mouth every 30 (thirty) days. 15 th of each month    . fluticasone (FLONASE) 50 MCG/ACT nasal spray Place 2 sprays into both nostrils 2 (two) times daily. (Patient taking differently: Place 2 sprays into both nostrils every other day. ) 16 g 8  . folic acid (FOLVITE) 1 MG tablet TAKE 1 TABLET DAILY 90 tablet 3  .  furosemide (LASIX) 40 MG tablet Take 1 tablet (40 mg total) by mouth 3 (three) times a week. 12 tablet 11  . ipratropium (ATROVENT) 0.06 % nasal spray Place 1 spray into both nostrils every other day.     . metoprolol succinate (TOPROL-XL) 25 MG 24 hr tablet Take 1 tablet (25 mg total) by mouth at bedtime. 90 tablet 3  . nitroGLYCERIN (NITROSTAT) 0.4 MG SL tablet DISSOLVE 1 TABLET UNDER THE TONGUE EVERY 5 MINUTES AS NEEDED FOR CHEST PAIN (MAX OF 3 TABLETS) 100 tablet 0  . simvastatin (ZOCOR) 20 MG tablet TAKE 1 TABLET  DAILY (ANNUAL APPOINTMENT DUE IN [place]. MUST SEE DOCTOR FOR REFILLS) 90 tablet 3  . triamcinolone cream (KENALOG) 0.1 % Apply 1 application topically 2 (two) times daily. 30 g 0  . vitamin B-12 (CYANOCOBALAMIN) 500 MCG tablet Take 500 mcg by mouth daily. Take one tablet every 3rd day    . vitamin C (ASCORBIC ACID) 500 MG tablet Take 1,000 mg by mouth daily.      No current facility-administered medications on file prior to visit.     Past Medical History:  Diagnosis Date  . Acute superficial venous thrombosis of lower extremity    a. RLE after CABG, neg dopp for DVT.  [name] Allergy   . Atrial fibrillation ([place])    a. Post-op from CABG, on amiodarone temporarily, d/c'd 12/2011  . CAD (coronary artery disease)    a. S/P stenting to mid RCA, prox PDA 06/1999. b. NSTEMI/CABG x 3 in 10/2011 with LIMA to LAD, SVG to PDA, and SVG to OM1.   . Chronic UTI    a. Followed by Dr. Risa Grill - colonized/asymptomatic - not on abx  . CKD (chronic kidney disease), stage III (HCC)    a. stable with a creatinine around 1.9-2.0 followed by nephrology.  . Dyslipidemia   . GERD (gastroesophageal reflux disease)   . Hypertension    well-controlled.  . Myocardial infarction ([place])   . Neck injury    a. C3-C4 and C4-C5 foraminal narrowing, severe  . Prostate cancer ([place])    a. 2001 s/p TURP.  . Pulmonary nodule    a. felt to be noncancerous.  Status post followup CT scan 4 mm and stable.  . Renal artery stenosis ([place])    a. 50% by cath 2001  . 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 cardia catherization  07-07-99  . CARDIAC SURGERY  10/18/12   open heart surgery  . CATARACT EXTRACTION W/ INTRAOCULAR LENS  IMPLANT, BILATERAL  3/205, 06/2013   m[facility]uen  . COLONOSCOPY  04/12/07  . CORONARY ARTERY BYPASS GRAFT  10/19/2011   Procedure: CORONARY ARTERY BYPASS GRAFTING (CABG);  Surgeon: Gaye Pollack, MD;  Location: [place];  Service: Open Heart Surgery;  Laterality: N/A;  times three using Left Internal Mammary Artery and Right Greater Saphenouse Vein Graft harvested Endoscopically  . edg  07-17-1994  . FLEXIBLE SIGMOIDOSCOPY  11-03-1997  . LEFT HEART CATHETERIZATION WITH CORONARY ANGIOGRAM N/A 10/16/2011   Procedure: LEFT HEART CATHETERIZATION WITH CORONARY ANGIOGRAM;  Surgeon: Peter M Martinique, MD;  Location[facility] C[facility] LAB;  Service: Cardiovascular;  Laterality: N/A;  . LEFT HEART CATHETERIZATION WITH CORONARY/GRAFT ANGIOGRAM N/A 03/11/2013   Procedure: LEFT HEART CATHETERIZATION WITH [name];  Surgeon: Blane Ohara, MD;  Location: [facility] CATH LAB;  Service: Cardiovascular;  Laterality: N/A;  . lumbar  spinal disk and neck fusion surgery    . PACEMAKER INSERTION  03/28/12   PPM implanted for mobitz II AV block  . peripheral vascular catherization  11-24-03  . PERMANENT PACEMAKER INSERTION N/A 03/28/2012   Procedure: PERMANENT PACEMAKER INSERTION;  Surgeon: Thompson Grayer, MD;  Location: [facility] CATH LAB;  Service: Cardiovascular;  Laterality: N/A;  . PROSTATECTOMY    . renal circulation  10-01-03  . s/p ptca    . stents     X 2  . stress cardiolite  05-04-05   spring 09-negative except for apical thinning, EF 68%    Social History   Socioeconomic History  . Marital status: Married    Spouse name: [name]  . Number of children: 2  . Years of education: Not on file  . Highest education level: Not on file  Occupational History  . 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 Financial resource strain: Not on file  . Food insecurity:    Worry: Not on file    Inability: Not on file  . Transportation needs:    Medical: Not on file    Non-medical: Not on file  Tobacco Use  . Smoking status: Never Smoker  . Smokeless tobacco: Never Used  Substance  and Sexual Activity  . Alcohol use: Yes    Comment: Rarely  . Drug use: No  . Sexual activity: Not on file  Lifestyle  . Physical activity:    Days per week: Not on file    Minutes per session: Not on file  . Stress: Not on file  Relationships  . Social connections:    Talks on phone: Not on file    Gets together: Not on file    Attends religious service: Not on file    Active member of club or organization: Not on file    Attends meetings of clubs or organizations: Not on file    Relationship status: Not on file  Other Topics Concern  . Not on file  Social History Narrative   HSG, 1 year college.  married '52 - 3 years, divorced; married '56 - 3 years divorced; married '58-12 yrs - divorced; married '75 -. 1 son '57; 1 daughter - '53; 1 grandchild.  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  Very happily married.  End of life care: yes CPR, no long term mechanical ventilation, no heroic measures.    Family History  Problem Relation Age of Onset  . Coronary artery disease Father        died @ 80  . Other Mother        cerebral hemorrhage - died @ 40  . Arthritis Mother   . Cancer Brother        Bladder  . Prostate cancer Brother   . Nephritis Brother        died @ age 70.  . Other Brother        cerebral hemorrhage - died @ 81  . Heart disease Brother   . Breast cancer Other        niece x 2  . Diabetes Neg Hx   . Colon cancer Neg Hx     Review of Systems  Constitutional: Positive for appetite change, fatigue and unexpected weight change.  Respiratory:  Negative for cough, shortness of breath and wheezing.   Cardiovascular: Positive for leg swelling. Negative for chest pain and palpitations.  Gastrointestinal: Positive for constipation and diarrhea (varies between constipation or diarrhea). Negative for abdominal pain and nausea.       Localized pressure from within abdomen  Skin: Positive for color change.  Neurological:  Positive for light-headedness.  Psychiatric/Behavioral: Positive for dysphoric mood and sleep disturbance.       Objective:   Vitals:   11/04/17 1309  BP: (!) 144/62  Pulse: 73  Resp: 16  Temp: 98 F (36.7 C)  SpO2: 99%   BP Readings from Last 3 Encounters:  11/04/17 (!) 144/62  10/11/17 (!) 112/58  10/07/17 (!) 142/68   Wt Readings from Last 3 Encounters:  11/04/17 168 lb 6.4 oz (76.4 kg)  10/24/17 164 lb (74.4 kg)  10/11/17 164 lb 3.2 oz (74.5 kg)   Body mass index is 23.49 kg/m.   Physical Exam  Constitutional: No distress.  Chronically ill appearing  HENT:  Head: Normocephalic and atraumatic.  Eyes: Conjunctivae are normal.  Neck: Neck supple. No tracheal deviation present. No thyromegaly present.  Cardiovascular: Normal rate and regular rhythm.  Pulmonary/Chest: Effort normal. No respiratory distress. He has no wheezes. He has no rales.  Abdominal: Soft. He exhibits no distension. There is no tenderness.  Musculoskeletal: He exhibits edema (mild legs and hands).  Lymphadenopathy:    He has no cervical adenopathy.  Skin: Rash (macular rash on abdomen and legs, generalized eythema posterior hands) noted. He is not diaphoretic.  Hyperpigmentation face  Psychiatric: He has a normal mood and affect.           Assessment & Plan:   Having several symptoms w/o obvious cause - he wonders about Addiosn's disease  - hyperpigmentation, rash,, Fatigue, dec appetite, weight loss, darkening of skin that is intermittent. Funny feelings in abdomen, muscle and jont pain, irritable, depressed, short tempered, occastional lightheaded  Will check cortisol level in am Refer to endo for further evaluation   See Problem List for Assessment and Plan of chronic medical problems.

## 2017-11-04 NOTE — Assessment & Plan Note (Addendum)
Areas of hyperpigmentation and redness - varies in intensity ? Addison's disease Will check cortisol level and refer to endo Check iron panel, cbc

## 2017-11-04 NOTE — Patient Instructions (Signed)
  Tests ordered today. Your results will be released to [place] (or called to you) after review, usually within 72hours after test completion. If any changes need to be made, you will be notified at that same time.  Medications reviewed and updated.  Changes include :   none    A referral was ordered for endocrine

## 2017-11-04 NOTE — Assessment & Plan Note (Signed)
Severe at times and can be often sudden onset ? Addison's disease Will check cortisol level and refer to endo

## 2017-11-05 ENCOUNTER — Other Ambulatory Visit (INDEPENDENT_AMBULATORY_CARE_PROVIDER_SITE_OTHER): Payer: Medicare Other

## 2017-11-05 DIAGNOSIS — R5383 Other fatigue: Secondary | ICD-10-CM

## 2017-11-05 DIAGNOSIS — L819 Disorder of pigmentation, unspecified: Secondary | ICD-10-CM

## 2017-11-05 LAB — CBC WITH DIFFERENTIAL/PLATELET
Basophils Absolute: 0 10*3/uL (ref 0.0–0.1)
Basophils Relative: 0.9 % (ref 0.0–3.0)
EOS ABS: 0.2 10*3/uL (ref 0.0–0.7)
EOS PCT: 4.4 % (ref 0.0–5.0)
HCT: 35.9 % — ABNORMAL LOW (ref 39.0–52.0)
Hemoglobin: 11.4 g/dL — ABNORMAL LOW (ref 13.0–17.0)
LYMPHS PCT: 18.7 % (ref 12.0–46.0)
Lymphs Abs: 1 10*3/uL (ref 0.7–4.0)
MCHC: 31.9 g/dL (ref 30.0–36.0)
MCV: 76.3 fl — ABNORMAL LOW (ref 78.0–100.0)
Monocytes Absolute: 0.7 10*3/uL (ref 0.1–1.0)
Monocytes Relative: 13 % — ABNORMAL HIGH (ref 3.0–12.0)
NEUTROS ABS: 3.4 10*3/uL (ref 1.4–7.7)
Neutrophils Relative %: 63 % (ref 43.0–77.0)
PLATELETS: 251 10*3/uL (ref 150.0–400.0)
RBC: 4.7 Mil/uL (ref 4.22–5.81)
RDW: 16.4 % — ABNORMAL HIGH (ref 11.5–15.5)
WBC: 5.4 10*3/uL (ref 4.0–10.5)

## 2017-11-05 LAB — COMPREHENSIVE METABOLIC PANEL
ALK PHOS: 62 U/L (ref 39–117)
ALT: 26 U/L (ref 0–53)
AST: 48 U/L — ABNORMAL HIGH (ref 0–37)
Albumin: 3.9 g/dL (ref 3.5–5.2)
BILIRUBIN TOTAL: 0.8 mg/dL (ref 0.2–1.2)
BUN: 29 mg/dL — ABNORMAL HIGH (ref 6–23)
CALCIUM: 9.2 mg/dL (ref 8.4–10.5)
CO2: 26 meq/L (ref 19–32)
Chloride: 104 mEq/L (ref 96–112)
Creatinine, Ser: 1.75 mg/dL — ABNORMAL HIGH (ref 0.40–1.50)
GFR: 39.18 mL/min — ABNORMAL LOW (ref >60.00)
Glucose, Bld: 91 mg/dL (ref 70–99)
POTASSIUM: 4.1 meq/L (ref 3.5–5.1)
Sodium: 139 mEq/L (ref 135–145)
Total Protein: 6.7 g/dL (ref 6.0–8.3)

## 2017-11-05 LAB — CORTISOL: CORTISOL PLASMA: 14 ug/dL

## 2017-11-06 ENCOUNTER — Encounter: Payer: Self-pay | Admitting: Internal Medicine

## 2017-11-06 LAB — IRON,TIBC AND FERRITIN PANEL
%SAT: 26 % (calc) (ref 20–48)
FERRITIN: 55 ng/mL (ref 24–380)
IRON: 66 ug/dL (ref 50–180)
TIBC: 257 ug/dL (ref 250–425)

## 2017-11-07 ENCOUNTER — Encounter

## 2017-11-20 LAB — CUP PACEART REMOTE DEVICE CHECK
Battery Impedance: 355 Ohm
Battery Remaining Longevity: 106 mo
Battery Voltage: 2.79 V
Brady Statistic AP VP Percent: 0 %
Brady Statistic AP VS Percent: 42 %
Brady Statistic AS VP Percent: 0 %
Date Time Interrogation Session: 20191008144041
Implantable Lead Implant Date: 20140321
Implantable Lead Location: 753860
Implantable Lead Model: 5092
Lead Channel Pacing Threshold Amplitude: 0.75 V
Lead Channel Pacing Threshold Amplitude: 0.875 V
Lead Channel Pacing Threshold Pulse Width: 0.4 ms
Lead Channel Pacing Threshold Pulse Width: 0.4 ms
Lead Channel Setting Pacing Amplitude: 2 V
Lead Channel Setting Pacing Amplitude: 2.5 V
Lead Channel Setting Pacing Pulse Width: 0.4 ms
MDC IDC LEAD IMPLANT DT: 20140321
MDC IDC LEAD LOCATION: 753859
MDC IDC MSMT LEADCHNL RA IMPEDANCE VALUE: 406 Ohm
MDC IDC MSMT LEADCHNL RV IMPEDANCE VALUE: 594 Ohm
MDC IDC PG IMPLANT DT: 20140321
MDC IDC SET LEADCHNL RV SENSING SENSITIVITY: 5.6 mV
MDC IDC STAT BRADY AS VS PERCENT: 58 %

## 2017-11-22 ENCOUNTER — Encounter: Payer: Self-pay | Admitting: Endocrinology

## 2017-11-22 ENCOUNTER — Ambulatory Visit (INDEPENDENT_AMBULATORY_CARE_PROVIDER_SITE_OTHER): Payer: Medicare Other | Admitting: Endocrinology

## 2017-11-22 VITALS — BP 150/76 | HR 84 | Ht 71.0 in | Wt 164.8 lb

## 2017-11-22 DIAGNOSIS — M791 Myalgia, unspecified site: Secondary | ICD-10-CM | POA: Diagnosis not present

## 2017-11-22 DIAGNOSIS — R5383 Other fatigue: Secondary | ICD-10-CM

## 2017-11-22 DIAGNOSIS — L819 Disorder of pigmentation, unspecified: Secondary | ICD-10-CM | POA: Diagnosis not present

## 2017-11-22 DIAGNOSIS — I2510 Atherosclerotic heart disease of native coronary artery without angina pectoris: Secondary | ICD-10-CM

## 2017-11-22 LAB — CK: Total CK: 654 U/L — ABNORMAL HIGH (ref 7–232)

## 2017-11-22 LAB — CORTISOL
CORTISOL PLASMA: 17.6 ug/dL
Cortisol, Plasma: 6.1 ug/dL

## 2017-11-22 MED ORDER — COSYNTROPIN NICU IV SYRINGE 0.25 MG/ML (STANDARD DOSE)
0.2500 mg | Freq: Once | INTRAVENOUS | Status: AC
  Administered 2017-11-22: 0.25 mg via INTRAMUSCULAR

## 2017-11-22 NOTE — Progress Notes (Signed)
Subjective:    Patient ID: [name], male    DOB: 12-30-1926, 84 y.o.   MRN: [id number]  HPI Pt is referred by Dr Quay Burow, for low cortisol.  This was noted 2 weeks ago.  no h/o abdominal or brain injury.  No h/o cancer, thyroid problems, seizures, hypoglycemia, amyloidosis, tuberculosis, or diabetes.  He has never been on steroid therapy.  No h/o ketoconazole, rifampin, or dilantin.  He has 6 mos of slight darkening of the skin throughout the body, and assoc fatigue.  He also has myalgias--he requests to check cpk.   Past Medical History:  Diagnosis Date  . Acute superficial venous thrombosis of lower extremity    a. RLE after CABG, neg dopp for DVT.  [name] Allergy   . Atrial fibrillation ([place])    a. Post-op from CABG, on amiodarone temporarily, d/c'd 12/2011  . CAD (coronary artery disease)    a. S/P stenting to mid RCA, prox PDA 06/1999. b. NSTEMI/CABG x 3 in 10/2011 with LIMA to LAD, SVG to PDA, and SVG to OM1.   . Chronic UTI    a. Followed by Dr. Risa Grill - colonized/asymptomatic - not on abx  . CKD (chronic kidney disease), stage III (HCC)    a. stable with a creatinine around 1.9-2.0 followed by nephrology.  . Dyslipidemia   . GERD (gastroesophageal reflux disease)   . Hypertension    well-controlled.  . Myocardial infarction ([place])   . Neck injury    a. C3-C4 and C4-C5 foraminal narrowing, severe  . Prostate cancer ([place])    a. 2001 s/p TURP.  . Pulmonary nodule    a. felt to be noncancerous.  Status post followup CT scan 4 mm and stable.  . Renal artery stenosis ([place])    a. 50% by cath 2001  . 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 cardia catherization  07-07-99  . CARDIAC SURGERY  10/18/12   open heart surgery  . CATARACT EXTRACTION W/ INTRAOCULAR LENS  IMPLANT, BILATERAL  3/205, 06/2013   [facility]uen  . COLONOSCOPY  04/12/07  . CORONARY ARTERY BYPASS GRAFT  10/19/2011   Procedure: CORONARY ARTERY BYPASS GRAFTING (CABG);  Surgeon: Gaye Pollack, MD;  Location: [place];  Service: Open Heart Surgery;  Laterality: N/A;  times three using Left Internal Mammary Artery and Right Greater Saphenouse Vein Graft harvested Endoscopically  . edg  07-17-1994  . FLEXIBLE SIGMOIDOSCOPY  11-03-1997  . LEFT HEART CATHETERIZATION WITH CORONARY ANGIOGRAM N/A 10/16/2011   Procedure: LEFT HEART CATHETERIZATION WITH CORONARY ANGIOGRAM;  Surgeon: Peter M Martinique, MD;  Location: [facility] CATH LAB;  Service: Cardiovascular;  Laterality: N/A;  . LEFT HEART CATHETERIZATION WITH CORONARY/GRAFT ANGIOGRAM N/A 03/11/2013   Procedure: LEFT HEART CATHETERIZATION WITH [name];  Surgeon: Blane Ohara, MD;  Location: [facility] CATH LAB;  Service: Cardiovascular;  Laterality: N/A;  . lumbar spinal disk and neck fusion surgery    . PACEMAKER INSERTION  03/28/12   PPM implanted for mobitz II AV block  . peripheral vascular catherization  11-24-03  . PERMANENT PACEMAKER INSERTION N/A 03/28/2012   Procedure: PERMANENT PACEMAKER INSERTION;  Surgeon: Thompson Grayer, MD;  Location: [facility] CATH LAB;  Service: Cardiovascular;  Laterality: N/A;  . PROSTATECTOMY    . renal circulation  10-01-03  . s/p ptca    . stents     X 2  . stress cardiolite  05-04-05   spring 09-negative except for apical thinning, EF 68%    Social History   Socioeconomic History  . Marital status: Married    Spouse name: [name]  . Number of children: 2  . Years of education: Not on file  . Highest education level: Not on file  Occupational History  . 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 Financial resource strain: Not on file  . Food insecurity:    Worry: Not on file    Inability: Not on file  . Transportation needs:    Medical: Not on file    Non-medical: Not on file   Tobacco Use  . Smoking status: Never Smoker  . Smokeless tobacco: Never Used  Substance and Sexual Activity  . Alcohol use: Yes    Comment: Rarely  . Drug use: No  . Sexual activity: Not on file  Lifestyle  . Physical activity:    Days per week: Not on file    Minutes per session: Not on file  . Stress: Not on file  Relationships  . Social connections:    Talks on phone: Not on file    Gets together: Not on file    Attends religious service: Not on file    Active member of club or organization: Not on file    Attends meetings of clubs or organizations: Not on file    Relationship status: Not on file  . Intimate partner violence:    Fear of current or ex partner: Not on file    Emotionally abused: Not on file    Physically abused: Not on file    Forced sexual activity: Not on file  Other Topics Concern  . Not on file  Social History Narrative   HSG, 1 year college.  married '52 - 3 years, divorced; married '56 - 3 years divorced; married '42-12 yrs - divorced; married '75 -. 1 son '57; 1 daughter - '53; 1 grandchild.  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  Very happily married.  End of life care: yes CPR, no long term mechanical ventilation, no heroic measures.    Current Outpatient Medications on File Prior to Visit  Medication Sig Dispense Refill  . acetaminophen (TYLENOL) 500 MG tablet Take 500 mg by mouth every 6 (six) hours as needed.    [name] aspirin EC 81 MG tablet Take 1 tablet (81 mg total) by mouth daily.    . carboxymethylcellulose (REFRESH PLUS) 0.5 % SOLN Place 1 drop into both eyes daily as needed (dry eyes). 30 mL 1  . DiphenhydrAMINE HCl, Sleep, (ZZZQUIL PO) Take 1 Dose by mouth at bedtime as needed (sleep).    . ergocalciferol (VITAMIN D2) 50000 UNITS capsule Take 50,000 Units by mouth every 30 (thirty) days. 15 th of each month    . fluticasone (FLONASE) 50 MCG/ACT nasal spray Place 2 sprays into both nostrils 2  (two) times daily. (Patient taking differently: Place 2 sprays into both nostrils every other day. ) 16 g 8  . folic acid (FOLVITE) 1 MG tablet TAKE 1 TABLET DAILY 90 tablet 3  . furosemide (LASIX) 40 MG tablet Take 1 tablet (40 mg total) by mouth 3 (three) times a week. 12 tablet 11  . ipratropium (ATROVENT) 0.06 % nasal spray  Place 1 spray into both nostrils every other day.     . metoprolol succinate (TOPROL-XL) 25 MG 24 hr tablet Take 1 tablet (25 mg total) by mouth at bedtime. 90 tablet 3  . nitroGLYCERIN (NITROSTAT) 0.4 MG SL tablet DISSOLVE 1 TABLET UNDER THE TONGUE EVERY 5 MINUTES AS NEEDED FOR CHEST PAIN (MAX OF 3 TABLETS) 100 tablet 0  . simvastatin (ZOCOR) 20 MG tablet TAKE 1 TABLET DAILY (ANNUAL APPOINTMENT DUE IN [place]. MUST SEE DOCTOR FOR REFILLS) 90 tablet 3  . triamcinolone cream (KENALOG) 0.1 % Apply 1 application topically 2 (two) times daily. 30 g 0  . vitamin B-12 (CYANOCOBALAMIN) 500 MCG tablet Take 500 mcg by mouth daily. Take one tablet every 3rd day    . vitamin C (ASCORBIC ACID) 500 MG tablet Take 1,000 mg by mouth daily.      No current facility-administered medications on file prior to visit.     Allergies  Allergen Reactions  . Aspirin Other (See Comments) and Nausea And Vomiting    REACTION: upset stomach; tolerates coated aspirin REACTION: upset stomach; tolerates coated aspirin   . Amoxicillin Nausea Only  . Atarax [Hydroxyzine Hcl] Other (See Comments)    REACTION: unknown  . Cephalexin Diarrhea  . Ciprofloxacin Nausea Only  . Clindamycin   . Clobetasol     Non effective  . Codeine     REACTION: Stomach upset  . Fluarix [Flu Virus Vaccine] Itching  . Haemophilus Influenzae Itching  . Hydrocodone Nausea Only  . Hydrocodone-Acetaminophen Nausea And Vomiting  . Hydroxyzine Nausea And Vomiting  . Latex Itching    After flu shot  . Lisinopril     After 3 tablets, he experienced trouble swallowing, throat tightness and hoarseness.   Santiago Bur  [Nitrofurantoin Macrocrystal] Nausea Only  . Niacin Other (See Comments)    REACTION: unknown  . Niacin-Lovastatin Er     Unsure of reaction. Taking simvastatin at home without problems  . Niacin-Lovastatin Er   . Nitrofurantoin Other (See Comments)    REACTION: upset stomach REACTION: upset stomach  . Vibramycin  [Doxycycline]   . Bactrim [Sulfamethoxazole-Trimethoprim] Rash  . Colchicine Rash    Family History  Problem Relation Age of Onset  . Coronary artery disease Father        died @ 25  . Other Mother        cerebral hemorrhage - died @ 26  . Arthritis Mother   . Cancer Brother        Bladder  . Prostate cancer Brother   . Nephritis Brother        died @ age 53.  . Other Brother        cerebral hemorrhage - died @ 46  . Heart disease Brother   . Breast cancer Other        niece x 2  . Diabetes Neg Hx   . Colon cancer Neg Hx   . Adrenal disorder Neg Hx     BP (!) 150/76 (BP Location: Left Arm, Patient Position: Sitting, Cuff Size: Normal)   Pulse 84   Ht 5\' 11"  (1.803 m)   Wt 164 lb 12.8 oz (74.8 kg)   SpO2 96%   BMI 22.98 kg/m     Review of Systems Denies fever, headache, dizziness, palpitations, blurry vision, n/v, abd pain, memory loss, seizure, muscle weakness, easy bruising, cold intolerance, diarrhea, vitiligo, excessive sweating. He has arthralgias, doe, irritability, insomnia, and leg swelling.  He has lost a few  lbs.      Objective:   Physical Exam VS: see vs page GEN: no distress HEAD: head: no deformity eyes: no periorbital swelling, no proptosis external nose and ears are normal mouth: no lesion seen NECK: supple, thyroid is not enlarged CHEST WALL: no deformity.  Old healed surgical scar (median sternotomy).  Pacemaker is also noted. LUNGS: clear to auscultation CV: reg rate and rhythm, no murmur ABD: abdomen is soft, nontender.  no hepatosplenomegaly.  not distended.  no hernia MUSCULOSKELETAL: muscle bulk and strength are grossly  normal.  no obvious joint swelling.  gait is normal and steady. EXTEMITIES: no deformity.  no edema PULSES: no carotid bruit NEURO:  cn 2-12 grossly intact.   readily moves all 4's.  sensation is intact to touch on all 4's SKIN:  Normal texture and temperature.  No suspicious lesion is visible.  Minimal red rash is noted at the elbows and knees.  NODES:  None palpable at the neck PSYCH: alert, well-oriented.  Does not appear anxious nor depressed.   Cortisol=14 CT (10/06/17): normal adrenals.  Lab Results  Component Value Date   TSH 1.50 05/17/2017   Lab Results  Component Value Date   CALCIUM 9.2 11/05/2017   CAION 1.25 01/30/2013   Glucose=91  ACTH stimulation test is done: baseline cortisol level=6 then Cosyntropin 250 mcg is given im 45 minutes later, cortisol level=18 (normal response)     Assessment & Plan:  HTN: is noted today Fatigue: new to me.  Adrenal insuff is excluded Myalgias: check CPK at pt's request  Patient Instructions  Your blood pressure is high today.  Please see your primary care provider soon, to have it rechecked.   blood tests are requested for you today.  We'll let you know about the results.

## 2017-11-22 NOTE — Patient Instructions (Addendum)
Your blood pressure is high today.  Please see your primary care provider soon, to have it rechecked.   blood tests are requested for you today.  We'll let you know about the results.

## 2017-11-25 ENCOUNTER — Telehealth: Payer: Self-pay | Admitting: Endocrinology

## 2017-11-25 DIAGNOSIS — M791 Myalgia, unspecified site: Secondary | ICD-10-CM

## 2017-11-25 LAB — ACTH: C206 ACTH: 9 pg/mL (ref 6–50)

## 2017-11-25 NOTE — Telephone Encounter (Signed)
Please advise 

## 2017-11-25 NOTE — Telephone Encounter (Signed)
I don't know--criteria would be someone who takes your insurance, accepts the referral, and is OK with you.

## 2017-11-25 NOTE — Telephone Encounter (Signed)
Patient has called in regards to MyChart messages. Patient is wanting to know the referring providers, Name, profession, location, phone number, ect. Please Advise, thanks

## 2017-11-25 NOTE — Telephone Encounter (Signed)
Please advise and I will be happy to call pt to ease his worries.

## 2017-11-26 NOTE — Telephone Encounter (Signed)
Follow up.

## 2017-11-26 NOTE — Telephone Encounter (Signed)
Called pt and informed of referral process. Verbalized acceptance and understanding.

## 2017-11-27 ENCOUNTER — Other Ambulatory Visit: Payer: Self-pay | Admitting: Cardiovascular Disease

## 2017-11-27 ENCOUNTER — Other Ambulatory Visit: Payer: Self-pay | Admitting: Internal Medicine

## 2017-11-27 MED ORDER — FUROSEMIDE 40 MG PO TABS: 40.0000 mg | ORAL_TABLET | ORAL | 3 refills | Status: DC

## 2017-11-29 DIAGNOSIS — M25552 Pain in left hip: Secondary | ICD-10-CM | POA: Insufficient documentation

## 2017-11-29 DIAGNOSIS — M7062 Trochanteric bursitis, left hip: Secondary | ICD-10-CM | POA: Insufficient documentation

## 2017-12-03 NOTE — Telephone Encounter (Signed)
Ok, I placed referral

## 2017-12-03 NOTE — Telephone Encounter (Signed)
Please place the referral for rheumatology as soon as you can, pt isrequesting this to be done before 12 today so helen can work on it before she leaves at 1

## 2017-12-03 NOTE — Addendum Note (Signed)
Addended by: Renato Shin on: 12/03/2017 10:08 AM   Modules accepted: Orders

## 2017-12-11 ENCOUNTER — Other Ambulatory Visit: Payer: Self-pay | Admitting: Internal Medicine

## 2017-12-12 DIAGNOSIS — M961 Postlaminectomy syndrome, not elsewhere classified: Secondary | ICD-10-CM

## 2017-12-12 HISTORY — DX: Postlaminectomy syndrome, not elsewhere classified: M96.1

## 2017-12-18 ENCOUNTER — Encounter: Payer: Self-pay | Admitting: Physician Assistant

## 2017-12-23 ENCOUNTER — Encounter: Payer: Self-pay | Admitting: Internal Medicine

## 2017-12-23 ENCOUNTER — Ambulatory Visit (INDEPENDENT_AMBULATORY_CARE_PROVIDER_SITE_OTHER): Payer: Medicare Other | Admitting: Internal Medicine

## 2017-12-23 VITALS — BP 162/70 | HR 70 | Temp 98.6°F | Resp 18 | Ht 71.0 in | Wt 161.0 lb

## 2017-12-23 DIAGNOSIS — I2510 Atherosclerotic heart disease of native coronary artery without angina pectoris: Secondary | ICD-10-CM

## 2017-12-23 DIAGNOSIS — J069 Acute upper respiratory infection, unspecified: Secondary | ICD-10-CM | POA: Diagnosis not present

## 2017-12-23 NOTE — Patient Instructions (Signed)
Continue your current treatment.  If your symptoms worsen please call and we will consider an antibiotic.

## 2017-12-23 NOTE — Progress Notes (Signed)
Subjective:    Patient ID: [name], male    DOB: 09-Sep-1926, 84 y.o.   MRN: [id number]  HPI He is here for an acute visit for cold symptoms.  His symptoms started 2 days ago, but he felt it coming on 4 days ago  He is experiencing rhinorrhea, nasal congestion with slight gray discoloration, chills, postnasal drip, sore throat, cough that started 2 days ago and headaches.  He denies any fever, ear pain, shortness of breath, wheezing, sinus pain, nausea, diarrhea and lightheadedness.  He has tried taking cough drops and nyquil.   Medications and allergies reviewed with patient and updated if appropriate.  Patient Active Problem List   Diagnosis Date Noted  . Hyperpigmentation 11/04/2017  . Abnormal appearance of skin 10/07/2017  . Frequent urination 09/03/2017  . Gout attack 09/03/2017  . Postnasal drip 08/06/2017  . Rash and nonspecific skin eruption 08/06/2017  . Moderate aortic regurgitation 07/04/2017  . Diastolic dysfunction 72/53/6644  . Edema 06/29/2017  . DOE (dyspnea on exertion) 06/29/2017  . Sinus symptom 06/05/2017  . Fatigue 05/18/2017  . Sleeping difficulty 05/18/2017  . Foot pain, bilateral 04/22/2017  . Trochanteric bursitis 01/10/2017  . Internal hemorrhoids 08/15/2016  . Loose stools 02/06/2016  . Dysphagia 02/06/2016  . Cephalalgia 07/14/2015  . Constipation 03/01/2015  . Myalgia 03/02/2014  . CHB (complete heart block) ([place]) 05/03/2012  . Postoperative atrial fibrillation ([place]) 05/03/2012  . Pacemaker 04/10/2012  . 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 Coronary atherosclerosis 11/27/2011  . Presence of aortocoronary bypass graft 10/24/2011  . Pulmonary nodule   . CAD - CABG Oct 2013   . Chronic renal impairment, stage 3 (moderate) (HCC)   . Venous insufficiency of leg 06/05/2010  . SHOULDER PAIN, RIGHT, CHRONIC 10/14/2009  . B12 nutritional deficiency 08/23/2009  . Peripheral neuropathy 03/21/2009  .  DEGENERATIVE DISC DISEASE, CERVICAL SPINE, W/RADICULOPATHY 09/21/2008  . Dyslipidemia 01/17/2007  . Essential hypertension 07/20/2006  . Prostate cancer ([place]) 07/20/2006    Current Outpatient Medications on File Prior to Visit  Medication Sig Dispense Refill  . acetaminophen (TYLENOL) 500 MG tablet Take 500 mg by mouth every 6 (six) hours as needed.    [name] aspirin EC 81 MG tablet Take 1 tablet (81 mg total) by mouth daily.    . carboxymethylcellulose (REFRESH PLUS) 0.5 % SOLN Place 1 drop into both eyes daily as needed (dry eyes). 30 mL 1  . DiphenhydrAMINE HCl, Sleep, (ZZZQUIL PO) Take 1 Dose by mouth at bedtime as needed (sleep).    . ergocalciferol (VITAMIN D2) 50000 UNITS capsule Take 50,000 Units by mouth every 30 (thirty) days. 15 th of each month    . folic acid (FOLVITE) 1 MG tablet TAKE 1 TABLET DAILY 90 tablet 3  . furosemide (LASIX) 40 MG tablet Take 1 tablet (40 mg total) by mouth 3 (three) times a week. 36 tablet 3  . metoprolol succinate (TOPROL-XL) 25 MG 24 hr tablet Take 1 tablet (25 mg total) by mouth at bedtime. 90 tablet 3  . nitroGLYCERIN (NITROSTAT) 0.4 MG SL tablet DISSOLVE 1 TABLET UNDER THE TONGUE EVERY 5 MINUTES AS NEEDED FOR CHEST PAIN (MAX OF 3 TABLETS) 100 tablet 4  . triamcinolone cream (KENALOG) 0.1 % Apply 1 application topically 2 (two) times daily. 30 g 0  . vitamin B-12 (CYANOCOBALAMIN) 500 MCG tablet Take 500 mcg by mouth daily. Take one tablet every 3rd day    . vitamin C (ASCORBIC  ACID) 500 MG tablet Take 1,000 mg by mouth daily.     . simvastatin (ZOCOR) 20 MG tablet TAKE 1 TABLET DAILY (ANNUAL APPOINTMENT DUE IN [place]. MUST SEE DOCTOR FOR REFILLS) (Patient not taking: Reported on 12/23/2017) 90 tablet 1   No current facility-administered medications on file prior to visit.     Past Medical History:  Diagnosis Date  . Acute superficial venous thrombosis of lower extremity    a. RLE after CABG, neg dopp for DVT.  [name] Allergy   . Atrial fibrillation  ([place])    a. Post-op from CABG, on amiodarone temporarily, d/c'd 12/2011  . CAD (coronary artery disease)    a. S/P stenting to mid RCA, prox PDA 06/1999. b. NSTEMI/CABG x 3 in 10/2011 with LIMA to LAD, SVG to PDA, and SVG to OM1.   . Chronic UTI    a. Followed by Dr. Risa Grill - colonized/asymptomatic - not on abx  . CKD (chronic kidney disease), stage III (HCC)    a. stable with a creatinine around 1.9-2.0 followed by nephrology.  . Dyslipidemia   . GERD (gastroesophageal reflux disease)   . Hypertension    well-controlled.  . Myocardial infarction ([place])   . Neck injury    a. C3-C4 and C4-C5 foraminal narrowing, severe  . Prostate cancer ([place])    a. 2001 s/p TURP.  . Pulmonary nodule    a. felt to be noncancerous.  Status post followup CT scan 4 mm and stable.  . Renal artery stenosis ([place])    a. 50% by cath 2001  . 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 cardia catherization  07-07-99  . CARDIAC SURGERY  10/18/12   open heart surgery  . CATARACT EXTRACTION W/ INTRAOCULAR LENS  IMPLANT, BILATERAL  3/205, 06/2013   [facility]  . COLONOSCOPY  04/12/07  . CORONARY ARTERY BYPASS GRAFT  10/19/2011   Procedure: CORONARY ARTERY BYPASS GRAFTING (CABG);  Surgeon: Gaye Pollack, MD;  Location: [facility];  Service: Open Heart Surgery;  Laterality: N/A;  times three using Left Internal Mammary Artery and Right Greater Saphenouse Vein Graft harvested Endoscopically  . edg  07-17-1994  . FLEXIBLE SIGMOIDOSCOPY  11-03-1997  . LEFT HEART CATHETERIZATION WITH CORONARY ANGIOGRAM N/A 10/16/2011   Procedure: LEFT HEART CATHETERIZATION WITH CORONARY ANGIOGRAM;  Surgeon: Peter M Martinique, MD;  Location: [facility] CATH LAB;  Service: Cardiovascular;  Laterality: N/A;  . LEFT HEART CATHETERIZATION WITH CORONARY/GRAFT ANGIOGRAM N/A 03/11/2013   Procedure: LEFT HEART CATHETERIZATION WITH [name];  Surgeon: Blane Ohara, MD;  Location: [facility] CATH LAB;  Service: Cardiovascular;  Laterality: N/A;  . lumbar spinal disk and neck fusion surgery    . PACEMAKER INSERTION  03/28/12   PPM implanted for mobitz II AV block  . peripheral vascular catherization  11-24-03  . PERMANENT PACEMAKER INSERTION N/A 03/28/2012   Procedure: PERMANENT PACEMAKER INSERTION;  Surgeon: Thompson Grayer, MD;  [facility] LAB;  Service: Cardiovascular;  Laterality: N/A;  . PROSTATECTOMY    . renal circulation  10-01-03  . s/p ptca    . stents     X 2  . stress cardiolite  05-04-05   spring 09-negative except for apical thinning, EF 68%    Social History   Socioeconomic History  . Marital status: Married    Spouse name: [name]  . Number of children: 2  . Years of education:  Not on file  . Highest education level: Not on file  Occupational History  . 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 Financial resource strain: Not on file  . Food insecurity:    Worry: Not on file    Inability: Not on file  . Transportation needs:    Medical: Not on file    Non-medical: Not on file  Tobacco Use  . Smoking status: Never Smoker  . Smokeless tobacco: Never Used  Substance and Sexual Activity  . Alcohol use: Yes    Comment: Rarely  . Drug use: No  . Sexual activity: Not on file  Lifestyle  . Physical activity:    Days per week: Not on file    Minutes per session: Not on file  . Stress: Not on file  Relationships  . Social connections:    Talks on phone: Not on file    Gets together: Not on file    Attends religious service: Not on file    Active member of club or organization: Not on file    Attends meetings of clubs or organizations: Not on file    Relationship status: Not on file  Other Topics Concern  . Not on file  Social History Narrative   HSG, 1 year  college.  married '52 - 3 years, divorced; married '56 - 3 years divorced; married '71-12 yrs - divorced; married '75 -. 1 son '57; 1 daughter - '53; 1 grandchild.  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  Very happily married.  End of life care: yes CPR, no long term mechanical ventilation, no heroic measures.    Family History  Problem Relation Age of Onset  . Coronary artery disease Father        died @ 70  . Other Mother        cerebral hemorrhage - died @ 99  . Arthritis Mother   . Cancer Brother        Bladder  . Prostate cancer Brother   . Nephritis Brother        died @ age 30.  . Other Brother        cerebral hemorrhage - died @ 61  . Heart disease Brother   . Breast cancer Other        niece x 2  . Diabetes Neg Hx   . Colon cancer Neg Hx   . Adrenal disorder Neg Hx     Review of Systems  Constitutional: Positive for chills. Negative for fever.  HENT: Positive for congestion (mucus is a light gray color ), postnasal drip, rhinorrhea and sore throat (this morning). Negative for ear pain and sinus pain.   Respiratory: Positive for cough (started 2 nights ago). Negative for shortness of breath and wheezing.   Gastrointestinal: Positive for constipation. Negative for diarrhea and nausea.  Neurological: Positive for headaches (intermittent). Negative for light-headedness.       Objective:   Vitals:   12/23/17 1035  BP: (!) 162/70  Pulse: 70  Resp: 18  Temp: 98.6 F (37 C)  SpO2: 96%   Filed Weights   12/23/17 1035  Weight: 161 lb (73 kg)   Body mass index is 22.45 kg/m.  Wt Readings from Last 3 Encounters:  12/23/17 161 lb (  73 kg)  11/22/17 164 lb 12.8 oz (74.8 kg)  11/04/17 168 lb 6.4 oz (76.4 kg)     Physical Exam GENERAL APPEARANCE: Appears stated age, well appearing, NAD EYES: conjunctiva clear, no icterus HEENT: bilateral tympanic membranes and ear canals normal, oropharynx with no erythema, no  thyromegaly, trachea midline, no cervical or supraclavicular lymphadenopathy LUNGS: Clear to auscultation without wheeze or crackles, unlabored breathing, good air entry bilaterally CARDIOVASCULAR: Normal S1,S2 without murmurs, no edema SKIN: warm, dry        Assessment & Plan:   See Problem List for Assessment and Plan of chronic medical problems.

## 2017-12-23 NOTE — Assessment & Plan Note (Signed)
Symptoms likely viral in nature Continue symptomatic treatment with over-the-counter cold medications, Tylenol/ibuprofen, NyQuil Increase rest and fluids Call if symptoms worsen or do not improve-I will have a low threshold for starting antibiotic later this week if needed

## 2017-12-25 ENCOUNTER — Telehealth: Payer: Self-pay | Admitting: Internal Medicine

## 2017-12-25 MED ORDER — AZITHROMYCIN 250 MG PO TABS: ORAL_TABLET | ORAL | 0 refills | Status: DC

## 2017-12-25 MED ORDER — HYDROCODONE-HOMATROPINE 5-1.5 MG/5ML PO SYRP: 5.0000 mL | ORAL_SOLUTION | Freq: Three times a day (TID) | ORAL | 0 refills | Status: DC | PRN

## 2017-12-25 NOTE — Telephone Encounter (Signed)
Cough syrup sent.   He should double check w/ his kidney doctor regarding miralax

## 2017-12-25 NOTE — Telephone Encounter (Signed)
Copied from [place] [id number]. Topic: Quick Communication - See Telephone Encounter >> Dec 25, 2017  8:53 AM Burchel, Abbi R wrote: CRM for notification. See Telephone encounter for: 12/25/17.  Pt states he was instructed by Dr burns to call back if he was not feeling better in 2-3 days.  He state his is not feeling better. Pt  states he is very flush and warm, running a low grade fever.  Pt states is coughing up a great deal of phlegm and blowing out a lot of mucus.  He states his cough is keeping him up at night and he would like Dr Quay Burow to call in the cough medicine they discussed during his last OV.  Pt would like a call back from Dr Quay Burow or her asst.   Pt: [phone]

## 2017-12-25 NOTE — Telephone Encounter (Signed)
Also wants something for cough. Wants to know if it is ok to take miralax per another doctors request.

## 2017-12-25 NOTE — Telephone Encounter (Signed)
Pt aware of response.  

## 2017-12-25 NOTE — Telephone Encounter (Signed)
zpak sent to [facility]

## 2018-01-13 ENCOUNTER — Ambulatory Visit: Payer: Medicare Other | Admitting: Physician Assistant

## 2018-01-14 ENCOUNTER — Ambulatory Visit (INDEPENDENT_AMBULATORY_CARE_PROVIDER_SITE_OTHER): Payer: Medicare Other

## 2018-01-14 DIAGNOSIS — R001 Bradycardia, unspecified: Secondary | ICD-10-CM

## 2018-01-14 NOTE — Discharge Instructions (Signed)
Epoetin Alfa injection °What is this medicine? °EPOETIN ALFA (e POE e tin AL fa) helps your body make more red blood cells. This medicine is used to treat anemia caused by chronic kidney disease, cancer chemotherapy, or HIV-therapy. It may also be used before surgery if you have anemia. °This medicine may be used for other purposes; ask your health care provider or pharmacist if you have questions. °COMMON BRAND NAME(S): Epogen, Procrit, Retacrit °What should I tell my health care provider before I take this medicine? °They need to know if you have any of these conditions: °-cancer °-heart disease °-high blood pressure °-history of blood clots °-history of stroke °-low levels of folate, iron, or vitamin B12 in the blood °-seizures °-an unusual or allergic reaction to erythropoietin, albumin, benzyl alcohol, hamster proteins, other medicines, foods, dyes, or preservatives °-pregnant or trying to get pregnant °-breast-feeding °How should I use this medicine? °This medicine is for injection into a vein or under the skin. It is usually given by a health care professional in a hospital or clinic setting. °If you get this medicine at home, you will be taught how to prepare and give this medicine. Use exactly as directed. Take your medicine at regular intervals. Do not take your medicine more often than directed. °It is important that you put your used needles and syringes in a special sharps container. Do not put them in a trash can. If you do not have a sharps container, call your pharmacist or healthcare provider to get one. °A special MedGuide will be given to you by the pharmacist with each prescription and refill. Be sure to read this information carefully each time. °Talk to your pediatrician regarding the use of this medicine in children. While this drug may be prescribed for selected conditions, precautions do apply. °Overdosage: If you think you have taken too much of this medicine contact a poison control center  or emergency room at once. °NOTE: This medicine is only for you. Do not share this medicine with others. °What if I miss a dose? °If you miss a dose, take it as soon as you can. If it is almost time for your next dose, take only that dose. Do not take double or extra doses. °What may interact with this medicine? °Interactions have not been studied. °This list may not describe all possible interactions. Give your health care provider a list of all the medicines, herbs, non-prescription drugs, or dietary supplements you use. Also tell them if you smoke, drink alcohol, or use illegal drugs. Some items may interact with your medicine. °What should I watch for while using this medicine? °Your condition will be monitored carefully while you are receiving this medicine. °You may need blood work done while you are taking this medicine. °This medicine may cause a decrease in vitamin B6. You should make sure that you get enough vitamin B6 while you are taking this medicine. Discuss the foods you eat and the vitamins you take with your health care professional. °What side effects may I notice from receiving this medicine? °Side effects that you should report to your doctor or health care professional as soon as possible: °-allergic reactions like skin rash, itching or hives, swelling of the face, lips, or tongue °-seizures °-signs and symptoms of a blood clot such as breathing problems; changes in vision; chest pain; severe, sudden headache; pain, swelling, warmth in the leg; trouble speaking; sudden numbness or weakness of the face, arm or leg °-signs and symptoms of a stroke   like changes in vision; confusion; trouble speaking or understanding; severe headaches; sudden numbness or weakness of the face, arm or leg; trouble walking; dizziness; loss of balance or coordination °Side effects that usually do not require medical attention (report to your doctor or health care professional if they continue or are  bothersome): °-chills °-cough °-dizziness °-fever °-headaches °-joint pain °-muscle cramps °-muscle pain °-nausea, vomiting °-pain, redness, or irritation at site where injected °This list may not describe all possible side effects. Call your doctor for medical advice about side effects. You may report side effects to FDA at 1-800-FDA-1088. °Where should I keep my medicine? °Keep out of the reach of children. °Store in a refrigerator between 2 and 8 degrees C (36 and 46 degrees F). Do not freeze or shake. Throw away any unused portion if using a single-dose vial. Multi-dose vials can be kept in the refrigerator for up to 21 days after the initial dose. Throw away unused medicine. °NOTE: This sheet is a summary. It may not cover all possible information. If you have questions about this medicine, talk to your doctor, pharmacist, or health care provider. °© 2019 [employer]vier/[employer] Standard (2016-08-03 08:35:19) ° °

## 2018-01-15 ENCOUNTER — Encounter (HOSPITAL_COMMUNITY): Payer: Self-pay

## 2018-01-15 ENCOUNTER — Inpatient Hospital Stay (HOSPITAL_COMMUNITY)
Admission: RE | Admit: 2018-01-15 | Discharge: 2018-01-15 | Disposition: A | Discharge status: Home / Self Care | Payer: Medicare Other | Source: Ambulatory Visit | Attending: Nephrology | Admitting: Nephrology

## 2018-01-15 NOTE — Progress Notes (Signed)
Remote pacemaker transmission.   

## 2018-01-16 LAB — CUP PACEART REMOTE DEVICE CHECK
Battery Impedance: 354 Ohm
Battery Remaining Longevity: 106 mo
Battery Voltage: 2.79 V
Brady Statistic AP VP Percent: 0 %
Brady Statistic AP VS Percent: 40 %
Brady Statistic AS VP Percent: 0 %
Brady Statistic AS VS Percent: 60 %
Date Time Interrogation Session: 20200107191635
Implantable Lead Implant Date: 20140321
Implantable Lead Implant Date: 20140321
Implantable Lead Location: 753859
Implantable Lead Model: 5076
Implantable Lead Model: 5092
Implantable Pulse Generator Implant Date: 20140321
Lead Channel Impedance Value: 395 Ohm
Lead Channel Pacing Threshold Amplitude: 0.625 V
Lead Channel Pacing Threshold Amplitude: 0.75 V
Lead Channel Pacing Threshold Pulse Width: 0.4 ms
Lead Channel Pacing Threshold Pulse Width: 0.4 ms
Lead Channel Setting Pacing Amplitude: 2 V
Lead Channel Setting Pacing Amplitude: 2.5 V
Lead Channel Setting Pacing Pulse Width: 0.4 ms
Lead Channel Setting Sensing Sensitivity: 5.6 mV
MDC IDC LEAD LOCATION: 753860
MDC IDC MSMT LEADCHNL RV IMPEDANCE VALUE: 569 Ohm

## 2018-01-20 ENCOUNTER — Telehealth: Payer: Self-pay | Admitting: Internal Medicine

## 2018-01-20 NOTE — Telephone Encounter (Signed)
Patient would like a call back in reference to Advance Directives.  Him and his wife are going to draw directives up and would like Dr. Quay Burow input.  Would like to know if they need to set up an appointment.

## 2018-01-21 NOTE — Telephone Encounter (Signed)
LVM for pt to call back in regards.  

## 2018-01-21 NOTE — Telephone Encounter (Signed)
Pt is returning [name] call

## 2018-01-21 NOTE — Telephone Encounter (Signed)
Spoke with pt in regards to message below. Pt stated that he is going to talk with his lawyer about certain questions he has about his advanced directives. Will call us back if he needs [place] for anything else.

## 2018-01-22 ENCOUNTER — Encounter (HOSPITAL_COMMUNITY): Payer: Medicare Other

## 2018-01-23 ENCOUNTER — Ambulatory Visit (INDEPENDENT_AMBULATORY_CARE_PROVIDER_SITE_OTHER): Payer: Medicare Other | Admitting: Family Medicine

## 2018-01-23 ENCOUNTER — Ambulatory Visit (INDEPENDENT_AMBULATORY_CARE_PROVIDER_SITE_OTHER)
Admission: RE | Admit: 2018-01-23 | Discharge: 2018-01-23 | Disposition: A | Discharge status: Home / Self Care | Payer: Medicare Other | Source: Ambulatory Visit | Attending: Family Medicine | Admitting: Family Medicine

## 2018-01-23 VITALS — BP 148/78 | HR 67 | Temp 97.6°F | Ht 71.0 in | Wt 157.1 lb

## 2018-01-23 DIAGNOSIS — M7989 Other specified soft tissue disorders: Secondary | ICD-10-CM | POA: Diagnosis not present

## 2018-01-23 DIAGNOSIS — N183 Chronic kidney disease, stage 3 unspecified: Secondary | ICD-10-CM

## 2018-01-23 DIAGNOSIS — M79674 Pain in right toe(s): Secondary | ICD-10-CM

## 2018-01-23 MED ORDER — PREDNISONE 10 MG PO TABS: ORAL_TABLET | ORAL | 0 refills | Status: DC

## 2018-01-23 NOTE — Progress Notes (Signed)
Subjective:    Patient ID: [name], male    DOB: 12-02-1926, 85 y.o.   MRN: [id number]  HPI  [name] is an 85 year old male who presents today with right great toe swelling that started one day ago. He reports that he has experienced this in the past maybe 2 to 3 years ago. He presents today with house slippers as he states that it causes pain to wear regular footwear.  Associated mild erythema and edema present. Treatment with acetaminophen has provided no benefit.  Trauma: No Recent surgery: No Use of diuretics:  Lasix Low dose ASA: yes ETOH intake: None Fatty foods: rare  Fever: No Pertinent history: HTN, CAD, Chronic renal impairment stage 3   Review of Systems  Constitutional: Negative for chills, fatigue and fever.  Respiratory: Negative for cough, shortness of breath and wheezing.   Cardiovascular: Negative for chest pain and palpitations.  Gastrointestinal: Negative for abdominal pain.  Musculoskeletal:       Right toe pain and swelling  Neurological: Negative for dizziness and light-headedness.   Past Medical History:  Diagnosis Date  . Acute superficial venous thrombosis of lower extremity    a. RLE after CABG, neg dopp for DVT.  [name] Allergy   . Atrial fibrillation ([place])    a. Post-op from CABG, on amiodarone temporarily, d/c'd 12/2011  . CAD (coronary artery disease)    a. S/P stenting to mid RCA, prox PDA 06/1999. b. NSTEMI/CABG x 3 in 10/2011 with LIMA to LAD, SVG to PDA, and SVG to OM1.   . Chronic UTI    a. Followed by Dr. Risa Grill - colonized/asymptomatic - not on abx  . CKD (chronic kidney disease), stage III (HCC)    a. stable with a creatinine around 1.9-2.0 followed by nephrology.  . Dyslipidemia   . GERD (gastroesophageal reflux disease)   . Hypertension    well-controlled.  . Myocardial infarction ([place])   . Neck injury    a. C3-C4 and C4-C5 foraminal narrowing, severe  . Prostate cancer ([place])    a. 2001 s/p TURP.  . Pulmonary  nodule    a. felt to be noncancerous.  Status post followup CT scan 4 mm and stable.  . Renal artery stenosis ([place])    a. 50% by cath 2001  . 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     Social History   Socioeconomic History  . Marital status: Married    Spouse name: [name]  . Number of children: 2  . Years of education: Not on file  . Highest education level: Not on file  Occupational History  . 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 Financial resource strain: Not on file  . Food insecurity:    Worry: Not on file    Inability: Not on file  . Transportation needs:    Medical: Not on file    Non-medical: Not on file  Tobacco Use  . Smoking status: Never Smoker  . Smokeless tobacco: Never Used  Substance and Sexual Activity  . Alcohol use: Yes    Comment: Rarely  . Drug use: No  . Sexual activity: Not on file  Lifestyle  . Physical activity:    Days per week: Not on file  Minutes per session: Not on file  . Stress: Not on file  Relationships  . Social connections:    Talks on phone: Not on file    Gets together: Not on file    Attends religious service: Not on file    Active member of club or organization: Not on file    Attends meetings of clubs or organizations: Not on file    Relationship status: Not on file  . Intimate partner violence:    Fear of current or ex partner: Not on file    Emotionally abused: Not on file    Physically abused: Not on file    Forced sexual activity: Not on file  Other Topics Concern  . Not on file  Social History Narrative   HSG, 1 year college.  married '52 - 3 years, divorced; married '56 - 3 years divorced; married '79-12 yrs - divorced; married '75 -. 1 son '57; 1 daughter - '53; 1 grandchild.  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  Very happily married.  End of life care: yes CPR, no long term mechanical ventilation, no heroic measures.    Past Surgical History:  Procedure Laterality Date  . cardia catherization  07-07-99  . CARDIAC SURGERY  10/18/12   open heart surgery  . CATARACT EXTRACTION W/ INTRAOCULAR LENS  IMPLANT, BILATERAL  3/205, 06/2013   [facility]  . COLONOSCOPY  04/12/07  . CORONARY ARTERY BYPASS GRAFT  10/19/2011   Procedure: CORONARY ARTERY BYPASS GRAFTING (CABG);  Surgeon: Gaye Pollack, MD;  Location: [facility];  Service: Open Heart Surgery;  Laterality: N/A;  times three using Left Internal Mammary Artery and Right Greater Saphenouse Vein Graft harvested Endoscopically  . edg  07-17-1994  . FLEXIBLE SIGMOIDOSCOPY  11-03-1997  . LEFT HEART CATHETERIZATION WITH CORONARY ANGIOGRAM N/A 10/16/2011   Procedure: LEFT HEART CATHETERIZATION WITH CORONARY ANGIOGRAM;  Surgeon: Peter M Martinique, MD;  Location: [facility] CATH LAB;  Service: Cardiovascular;  Laterality: N/A;  . LEFT HEART CATHETERIZATION WITH CORONARY/GRAFT ANGIOGRAM N/A 03/11/2013   Procedure: LEFT HEART CATHETERIZATION WITH [name];  Surgeon: Blane Ohara, MD;  Location: [facility] CATH LAB;  Service: Cardiovascular;  Laterality: N/A;  . lumbar spinal disk and neck fusion surgery    . PACEMAKER INSERTION  03/28/12   PPM implanted for mobitz II AV block  . peripheral vascular catherization  11-24-03  . PERMANENT PACEMAKER INSERTION N/A 03/28/2012   Procedure: PERMANENT PACEMAKER INSERTION;  Surgeon: Thompson Grayer, MD;  Location: [facility] CATH LAB;  Service: Cardiovascular;  Laterality: N/A;  . PROSTATECTOMY    . renal circulation  10-01-03  . s/p ptca    . stents     X 2  . stress cardiolite  05-04-05   spring 09-negative except for apical thinning, EF 68%    Family History  Problem Relation Age of Onset  . Coronary artery disease Father        died @ 53  . Other Mother        cerebral hemorrhage - died @  62  . Arthritis Mother   . Cancer Brother        Bladder  . Prostate cancer Brother   . Nephritis Brother        died @ age 19.  . Other Brother        cerebral hemorrhage - died @ 61  . Heart disease Brother   . Breast cancer Other  niece x 2  . Diabetes Neg Hx   . Colon cancer Neg Hx   . Adrenal disorder Neg Hx     Allergies  Allergen Reactions  . Aspirin Other (See Comments) and Nausea And Vomiting    REACTION: upset stomach; tolerates coated aspirin REACTION: upset stomach; tolerates coated aspirin   . Amoxicillin Nausea Only  . Atarax [Hydroxyzine Hcl] Other (See Comments)    REACTION: unknown  . Cephalexin Diarrhea  . Ciprofloxacin Nausea Only  . Clindamycin   . Clobetasol     Non effective  . Codeine     REACTION: Stomach upset  . Fluarix [Flu Virus Vaccine] Itching  . Haemophilus Influenzae Itching  . Hydrocodone Nausea Only  . Hydrocodone-Acetaminophen Nausea And Vomiting  . Hydroxyzine Nausea And Vomiting  . Latex Itching    After flu shot  . Lisinopril     After 3 tablets, he experienced trouble swallowing, throat tightness and hoarseness.   Santiago Bur [Nitrofurantoin Macrocrystal] Nausea Only  . Niacin Other (See Comments)    REACTION: unknown  . Niacin-Lovastatin Er     Unsure of reaction. Taking simvastatin at home without problems  . Niacin-Lovastatin Er   . Nitrofurantoin Other (See Comments)    REACTION: upset stomach REACTION: upset stomach  . Vibramycin  [Doxycycline]   . Bactrim [Sulfamethoxazole-Trimethoprim] Rash  . Colchicine Rash    Current Outpatient Medications on File Prior to Visit  Medication Sig Dispense Refill  . acetaminophen (TYLENOL) 500 MG tablet Take 500 mg by mouth every 6 (six) hours as needed.    [name] aspirin EC 81 MG tablet Take 1 tablet (81 mg total) by mouth daily.    . carboxymethylcellulose (REFRESH PLUS) 0.5 % SOLN Place 1 drop into both eyes daily as needed (dry eyes). 30 mL 1  . DiphenhydrAMINE HCl, Sleep,  (ZZZQUIL PO) Take 1 Dose by mouth at bedtime as needed (sleep).    . ergocalciferol (VITAMIN D2) 50000 UNITS capsule Take 50,000 Units by mouth every 30 (thirty) days. 15 th of each month    . famotidine (PEPCID) 40 MG tablet     . folic acid (FOLVITE) 1 MG tablet TAKE 1 TABLET DAILY 90 tablet 3  . furosemide (LASIX) 40 MG tablet Take 1 tablet (40 mg total) by mouth 3 (three) times a week. 36 tablet 3  . metoprolol succinate (TOPROL-XL) 25 MG 24 hr tablet Take 1 tablet (25 mg total) by mouth at bedtime. 90 tablet 3  . nitroGLYCERIN (NITROSTAT) 0.4 MG SL tablet DISSOLVE 1 TABLET UNDER THE TONGUE EVERY 5 MINUTES AS NEEDED FOR CHEST PAIN (MAX OF 3 TABLETS) 100 tablet 4  . triamcinolone cream (KENALOG) 0.1 % Apply 1 application topically 2 (two) times daily. 30 g 0  . vitamin B-12 (CYANOCOBALAMIN) 500 MCG tablet Take 500 mcg by mouth daily. Take one tablet every 3rd day    . vitamin C (ASCORBIC ACID) 500 MG tablet Take 1,000 mg by mouth daily.     [name] azithromycin (ZITHROMAX) 250 MG tablet Take two tabs the first day and then one tab daily for four days (Patient not taking: Reported on 01/23/2018) 6 tablet 0  . cimetidine (TAGAMET) 300 MG tablet     . HYDROcodone-homatropine (HYCODAN) 5-1.5 MG/5ML syrup Take 5 mLs by mouth every 8 (eight) hours as needed for cough. (Patient not taking: Reported on 01/23/2018) 120 mL 0  . simvastatin (ZOCOR) 20 MG tablet TAKE 1 TABLET DAILY ([place]. MUST SEE DOCTOR  FOR REFILLS) (Patient not taking: Reported on 01/23/2018) 90 tablet 1   No current facility-administered medications on file prior to visit.     BP (!) 148/78 (BP Location: Left Arm, Patient Position: Sitting, Cuff Size: Normal)   Pulse 67   Temp 97.6 F (36.4 C) (Oral)   Ht 5\' 11"  (1.803 m)   Wt 157 lb 1.9 oz (71.3 kg)   SpO2 98%   BMI 21.91 kg/m       Objective:   Physical Exam Constitutional:      Appearance: Normal appearance.  Eyes:     General: No scleral icterus.     Pupils: Pupils are equal, round, and reactive to light.  Cardiovascular:     Rate and Rhythm: Normal rate.     Pulses: Normal pulses.     Heart sounds: Normal heart sounds.  Pulmonary:     Effort: Pulmonary effort is normal.     Breath sounds: Normal breath sounds.  Abdominal:     General: Abdomen is flat. Bowel sounds are normal.     Palpations: Abdomen is soft.  Musculoskeletal:     Comments: Right first MTP tender with erythema and mild edema present  Skin:    General: Skin is warm and dry.     Capillary Refill: Capillary refill takes less than 2 seconds.  Neurological:     Mental Status: He is alert.  Psychiatric:        Mood and Affect: Mood normal.        Behavior: Behavior normal.        Thought Content: Thought content normal.        Judgment: Judgment normal.           Assessment & Plan:  1. Pain and swelling of toe of right foot Exam and history are most consistent with gout. Clinical diagnostic rule for likelihood of gout score of 9 (male, previous flare, onset within one day, joint redness, first MTP involvement, and HTN present). Avoided NSAIDs due to kidney function. Opted for prednisone taper and advised dietary triggers to avoid. Further advised close follow up with PCP if no improvement or worsening symptoms. He voiced understanding and agreed with plan.   - predniSONE (DELTASONE) 10 MG tablet; Take 4 tablets once daily for 2 days, 3 tabs daily for 2 days, 2 tabs daily for 2 days, 1 tab daily for 2 days.  Dispense: 20 tablet; Refill: 0 - DG Foot Complete Right; Future  2. Chronic renal impairment, stage 3 (moderate) (HCC) Follow up with Dr. Justin Mend and advised informing him of gout flare.  Reinforced importance of avoiding NSAIDs for gout.  Delano Metz, FNP-C

## 2018-01-23 NOTE — Patient Instructions (Signed)
Please go to X-ray before you leave.  Prednisone should be taken with food as directed.  Schedule a follow up with Dr. Quay Burow.  If no improvement or worsening symptoms, follow up for further evaluation and treatment.    Gout  Gout is painful swelling of your joints. Gout is a type of arthritis. It is caused by having too much uric acid in your body. Uric acid is a chemical that is made when your body breaks down substances called purines. If your body has too much uric acid, sharp crystals can form and build up in your joints. This causes pain and swelling. Gout attacks can happen quickly and be very painful (acute gout). Over time, the attacks can affect more joints and happen more often (chronic gout). What are the causes?  Too much uric acid in your blood. This can happen because: ? Your kidneys do not remove enough uric acid from your blood. ? Your body makes too much uric acid. ? You eat too many foods that are high in purines. These foods include organ meats, some seafood, and beer.  Trauma or stress. What increases the risk?  Having a family history of gout.  Being male and middle-aged.  Being male and having gone through menopause.  Being very overweight (obese).  Drinking alcohol, especially beer.  Not having enough water in the body (being dehydrated).  Losing weight too quickly.  Having an organ transplant.  Having lead poisoning.  Taking certain medicines.  Having kidney disease.  Having a skin condition called psoriasis. What are the signs or symptoms? An attack of acute gout usually happens in just one joint. The most common place is the big toe. Attacks often start at night. Other joints that may be affected include joints of the feet, ankle, knee, fingers, wrist, or elbow. Symptoms of an attack may include:  Very bad pain.  Warmth.  Swelling.  Stiffness.  Shiny, red, or purple skin.  Tenderness. The affected joint may be very painful to  touch.  Chills and fever. Chronic gout may cause symptoms more often. More joints may be involved. You may also have white or yellow lumps (tophi) on your hands or feet or in other areas near your joints. How is this treated?  Treatment for this condition has two phases: treating an acute attack and preventing future attacks.  Acute gout treatment may include: ? NSAIDs. ? Steroids. These are taken by mouth or injected into a joint. ? Colchicine. This medicine relieves pain and swelling. It can be given by mouth or through an IV tube.  Preventive treatment may include: ? Taking small doses of NSAIDs or colchicine daily. ? Using a medicine that reduces uric acid levels in your blood. ? Making changes to your diet. You may need to see a food expert (dietitian) about what to eat and drink to prevent gout. Follow these instructions at home: During a gout attack   If told, put ice on the painful area: ? Put ice in a plastic bag. ? Place a towel between your skin and the bag. ? Leave the ice on for 20 minutes, 2-3 times a day.  Raise (elevate) the painful joint above the level of your heart as often as you can.  Rest the joint as much as possible. If the joint is in your leg, you may be given crutches.  Follow instructions from your doctor about what you cannot eat or drink. Avoiding future gout attacks  Eat a low-purine diet. Avoid  foods and drinks such as: ? Liver. ? Kidney. ? Anchovies. ? Asparagus. ? Herring. ? Mushrooms. ? Mussels. ? Beer.  Stay at a healthy weight. If you want to lose weight, talk with your doctor. Do not lose weight too fast.  Start or continue an exercise plan as told by your doctor. Eating and drinking  Drink enough fluids to keep your pee (urine) pale yellow.  If you drink alcohol: ? Limit how much you use to:  0-1 drink a day for women.  0-2 drinks a day for men. ? Be aware of how much alcohol is in your drink. In the [place] one drink equals  one 12 oz bottle of beer (355 mL), one 5 oz glass of wine (148 mL), or one 1 oz glass of hard liquor (44 mL). General instructions  Take over-the-counter and prescription medicines only as told by your doctor.  Do not drive or use heavy machinery while taking prescription pain medicine.  Return to your normal activities as told by your doctor. Ask your doctor what activities are safe for you.  Keep all follow-up visits as told by your doctor. This is important. Contact a doctor if:  You have another gout attack.  You still have symptoms of a gout attack after 10 days of treatment.  You have problems (side effects) because of your medicines.  You have chills or a fever.  You have burning pain when you pee (urinate).  You have pain in your lower back or belly. Get help right away if:  You have very bad pain.  Your pain cannot be controlled.  You cannot pee. Summary  Gout is painful swelling of the joints.  The most common site of pain is the big toe, but it can affect other joints.  Medicines and avoiding some foods can help to prevent and treat gout attacks. This information is not intended to replace advice given to you by your health care provider. Make sure you discuss any questions you have with your health care provider. Document Released: 10/04/2007 Document Revised: 07/17/2017 Document Reviewed: 07/17/2017 Elsevier Interactive Patient Education  2019 [employer].

## 2018-01-24 ENCOUNTER — Encounter: Payer: Self-pay | Admitting: Family Medicine

## 2018-01-24 ENCOUNTER — Ambulatory Visit (HOSPITAL_COMMUNITY)
Admission: RE | Admit: 2018-01-24 | Discharge: 2018-01-24 | Disposition: A | Discharge status: Home / Self Care | Payer: Medicare Other | Source: Ambulatory Visit | Attending: Nephrology | Admitting: Nephrology

## 2018-01-24 VITALS — BP 147/68 | HR 79 | Temp 98.0°F | Resp 20

## 2018-01-24 DIAGNOSIS — N183 Chronic kidney disease, stage 3 unspecified: Secondary | ICD-10-CM

## 2018-01-24 LAB — POCT HEMOGLOBIN-HEMACUE: Hemoglobin: 8.5 g/dL — ABNORMAL LOW (ref 13.0–17.0)

## 2018-01-24 MED ORDER — EPOETIN ALFA-EPBX 2000 UNIT/ML IJ SOLN
2000.0000 [IU] | INTRAMUSCULAR | Status: DC
  Administered 2018-01-24: 2000 [IU] via SUBCUTANEOUS
  Filled 2018-01-24: qty 1

## 2018-01-24 MED ORDER — EPOETIN ALFA-EPBX 3000 UNIT/ML IJ SOLN
3000.0000 [IU] | INTRAMUSCULAR | Status: DC
  Administered 2018-01-24: 3000 [IU] via SUBCUTANEOUS
  Filled 2018-01-24: qty 1

## 2018-01-24 MED ORDER — EPOETIN ALFA-EPBX 10000 UNIT/ML IJ SOLN: 5000.0000 [IU] | INTRAMUSCULAR | Status: DC

## 2018-01-30 NOTE — Progress Notes (Signed)
Subjective:    Patient ID: [name], male    DOB: 22-Jan-1926, 85 y.o.   MRN: [id number]  HPI The patient is here for follow up.  He saw Almyra Free on 1/16 and is here for follow up.  He saw her for right great toe swelling that started the day prior.  He had similar symptoms 2-3 years ago.  The toe is had mild erythema and was painful.  He denied fever.  He was diagnosed with gout.  He was prescribed prednisone. He had an xray of the toe, which was normal.  He finished the prednisone last night.  He states the color is better, the general pain is better (2-3/10), but he still gets severe pain at times.   It is still swollen and tight.      Saw Dr Earlean Shawl at [facility] for iron def anemia, dysphagia, fecal incontinence and black stools.  EGD to be done next week.  He had an iron injection last week and feels a little better.  He has an iron injection today.  He is still having black stools.  Elevated CK:  He saw Dr Amil Amen and stopped the statin.  He has not seen a big difference off the statin with his joint and muscle pain.  He sees Dr. Amil Amen for follow-up next week.  PMR is a possibility.      He is having b/l leg pain.  He can have electric like pain up to left leg to the hip and he has a electric current like pain in both legs.  He thinks it is coming from his hip and back.  He will follow up with ortho and may need an injection.    B12 def:  He is taking oral B12 but does not feel it is working as well as when he had the injections.  Given active GI bleed he would benefit from injections.  Medications and allergies reviewed with patient and updated if appropriate.  Patient Active Problem List   Diagnosis Date Noted  . Hyperpigmentation 11/04/2017  . Abnormal appearance of skin 10/07/2017  . Frequent urination 09/03/2017  . Gout attack 09/03/2017  . Postnasal drip 08/06/2017  . Rash and nonspecific skin eruption 08/06/2017  . Moderate aortic regurgitation 07/04/2017  .  Diastolic dysfunction 91/79/1505  . Edema 06/29/2017  . DOE (dyspnea on exertion) 06/29/2017  . Sinus symptom 06/05/2017  . Fatigue 05/18/2017  . Sleeping difficulty 05/18/2017  . Foot pain, bilateral 04/22/2017  . Trochanteric bursitis 01/10/2017  . Upper respiratory tract infection 10/13/2016  . Internal hemorrhoids 08/15/2016  . Loose stools 02/06/2016  . Dysphagia 02/06/2016  . Cephalalgia 07/14/2015  . Constipation 03/01/2015  . Myalgia 03/02/2014  . CHB (complete heart block) ([place]) 05/03/2012  . Postoperative atrial fibrillation ([place]) 05/03/2012  . Pacemaker 04/10/2012  . 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 Coronary atherosclerosis 11/27/2011  . Presence of aortocoronary bypass graft 10/24/2011  . Pulmonary nodule   . CAD - CABG Oct 2013   . Chronic renal impairment, stage 3 (moderate) (HCC)   . Venous insufficiency of leg 06/05/2010  . SHOULDER PAIN, RIGHT, CHRONIC 10/14/2009  . B12 nutritional deficiency 08/23/2009  . Peripheral neuropathy 03/21/2009  . DEGENERATIVE DISC DISEASE, CERVICAL SPINE, W/RADICULOPATHY 09/21/2008  . Dyslipidemia 01/17/2007  . Essential hypertension 07/20/2006  . Prostate cancer ([place]) 07/20/2006    Current Outpatient Medications on File Prior to Visit  Medication Sig Dispense Refill  . acetaminophen (  TYLENOL) 500 MG tablet Take 500 mg by mouth every 6 (six) hours as needed.    [name] aspirin EC 81 MG tablet Take 1 tablet (81 mg total) by mouth daily.    . carboxymethylcellulose (REFRESH PLUS) 0.5 % SOLN Place 1 drop into both eyes daily as needed (dry eyes). 30 mL 1  . cimetidine (TAGAMET) 300 MG tablet     . DiphenhydrAMINE HCl, Sleep, (ZZZQUIL PO) Take 1 Dose by mouth at bedtime as needed (sleep).    . ergocalciferol (VITAMIN D2) 50000 UNITS capsule Take 50,000 Units by mouth every 30 (thirty) days. 15 th of each month    . famotidine (PEPCID) 40 MG tablet     . folic acid (FOLVITE) 1 MG tablet TAKE 1 TABLET DAILY 90  tablet 3  . furosemide (LASIX) 40 MG tablet Take 1 tablet (40 mg total) by mouth 3 (three) times a week. 36 tablet 3  . metoprolol succinate (TOPROL-XL) 25 MG 24 hr tablet Take 1 tablet (25 mg total) by mouth at bedtime. 90 tablet 3  . nitroGLYCERIN (NITROSTAT) 0.4 MG SL tablet DISSOLVE 1 TABLET UNDER THE TONGUE EVERY 5 MINUTES AS NEEDED FOR CHEST PAIN (MAX OF 3 TABLETS) 100 tablet 4  . simvastatin (ZOCOR) 20 MG tablet TAKE 1 TABLET DAILY (ANNUAL APPOINTMENT DUE IN [place]. MUST SEE DOCTOR FOR REFILLS) 90 tablet 1  . triamcinolone cream (KENALOG) 0.1 % Apply 1 application topically 2 (two) times daily. 30 g 0  . vitamin B-12 (CYANOCOBALAMIN) 500 MCG tablet Take 500 mcg by mouth daily. Take one tablet every 3rd day    . vitamin C (ASCORBIC ACID) 500 MG tablet Take 1,000 mg by mouth daily.      No current facility-administered medications on file prior to visit.     Past Medical History:  Diagnosis Date  . Acute superficial venous thrombosis of lower extremity    a. RLE after CABG, neg dopp for DVT.  [name] Allergy   . Atrial fibrillation ([place])    a. Post-op from CABG, on amiodarone temporarily, d/c'd 12/2011  . CAD (coronary artery disease)    a. S/P stenting to mid RCA, prox PDA 06/1999. b. NSTEMI/CABG x 3 in 10/2011 with LIMA to LAD, SVG to PDA, and SVG to OM1.   . Chronic UTI    a. Followed by Dr. Risa Grill - colonized/asymptomatic - not on abx  . CKD (chronic kidney disease), stage III (HCC)    a. stable with a creatinine around 1.9-2.0 followed by nephrology.  . Dyslipidemia   . GERD (gastroesophageal reflux disease)   . Hypertension    well-controlled.  . Myocardial infarction ([place])   . Neck injury    a. C3-C4 and C4-C5 foraminal narrowing, severe  . Prostate cancer ([place])    a. 2001 s/p TURP.  . Pulmonary nodule    a. felt to be noncancerous.  Status post followup CT scan 4 mm and stable.  . Renal artery stenosis ([place])    a. 50% by cath 2001  . Symptomatic bradycardia    Mobitz II  AV block s/p Medtronic pacemaker 03/28/12    Past Surgical History:  Procedure Laterality Date  . cardia catherization  07-07-99  . CARDIAC SURGERY  10/18/12   open heart surgery  . CATARACT EXTRACTION W/ INTRAOCULAR LENS  IMPLANT, BILATERAL  3/205, 06/2013   mccuen  . COLONOSCOPY  04/12/07  . CORONARY ARTERY BYPASS GRAFT  10/19/2011   Procedure: CORONARY ARTERY BYPASS GRAFTING (CABG);  Surgeon: Gaspar Bidding  Alveria Apley, MD;  Location: [facility];  Service: Open Heart Surgery;  Laterality: N/A;  times three using Left Internal Mammary Artery and Right Greater Saphenouse Vein Graft harvested Endoscopically  . edg  07-17-1994  . FLEXIBLE SIGMOIDOSCOPY  11-03-1997  . LEFT HEART CATHETERIZATION WITH CORONARY ANGIOGRAM N/A 10/16/2011   Procedure: LEFT HEART CATHETERIZATION WITH CORONARY ANGIOGRAM;  Surgeon: Peter M Martinique, MD;  Location[facility] CATH LAB;  Service: Cardiovascular;  Laterality: N/A;  . LEFT HEART CATHETERIZATION WITH CORONARY/GRAFT ANGIOGRAM N/A 03/11/2013   Procedure: LEFT HEART CATHETERIZATION WITH [name];  Surgeon: Blane Ohara, MD;  Location: [facility] CATH LAB;  Service: Cardiovascular;  Laterality: N/A;  . lumbar spinal disk and neck fusion surgery    . PACEMAKER INSERTION  03/28/12   PPM implanted for mobitz II AV block  . peripheral vascular catherization  11-24-03  . PERMANENT PACEMAKER INSERTION N/A 03/28/2012   Procedure: PERMANENT PACEMAKER INSERTION;  Surgeon: Thompson Grayer, MD;  Location: [facility] CATH LAB;  Service: Cardiovascular;  Laterality: N/A;  . PROSTATECTOMY    . renal circulation  10-01-03  . s/p ptca    . stents     X 2  . stress cardiolite  05-04-05   spring 09-negative except for apical thinning, EF 68%    Social History   Socioeconomic History  . Marital status: Married    Spouse name: [name]  . Number of children: 2  . Years of education: Not on file  . Highest education level: Not on file  Occupational History  . 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 Financial resource strain: Not on file  . Food insecurity:    Worry: Not on file    Inability: Not on file  . Transportation needs:    Medical: Not on file    Non-medical: Not on file  Tobacco Use  . Smoking status: Never Smoker  . Smokeless tobacco: Never Used  Substance and Sexual Activity  . Alcohol use: Yes    Comment: Rarely  . Drug use: No  . Sexual activity: Not on file  Lifestyle  . Physical activity:    Days per week: Not on file    Minutes per session: Not on file  . Stress: Not on file  Relationships  . Social connections:    Talks on phone: Not on file    Gets together: Not on file    Attends religious service: Not on file    Active member of club or organization: Not on file    Attends meetings of clubs or organizations: Not on file    Relationship status: Not on file  Other Topics Concern  . Not on file  Social History Narrative   HSG, 1 year college.  married '52 - 3 years, divorced; married '56 - 3 years divorced; married '30-12 yrs - divorced; married '75 -. 1 son '57; 1 daughter - '53; 1 grandchild.  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  Very happily married.  End of life care: yes CPR, no long term mechanical ventilation, no heroic measures.    Family History  Problem Relation Age of Onset  . Coronary artery disease Father        died @ 89  . Other Mother  cerebral hemorrhage - died @ 29  . Arthritis Mother   . Cancer Brother        Bladder  . Prostate cancer Brother   . Nephritis Brother        died @ age 66.  . Other Brother        cerebral hemorrhage - died @ 29  . Heart disease Brother   . Breast cancer Other        niece x 2  . Diabetes Neg Hx   . Colon cancer Neg Hx   . Adrenal disorder Neg Hx     Review of Systems  Constitutional:  Positive for fatigue. Negative for fever.  Respiratory: Positive for shortness of breath.   Gastrointestinal: Positive for abdominal pain (twice in epigastric region in the past week) and constipation (miralax helps). Negative for blood in stool (black stools) and nausea.       No gerd  Musculoskeletal: Positive for back pain.       Leg from back/hip  Neurological: Negative for dizziness, light-headedness and headaches.       Objective:   Vitals:   01/31/18 0936  BP: (!) 102/50  Pulse: 74  Resp: 16  Temp: 97.8 F (36.6 C)  SpO2: 96%   BP Readings from Last 3 Encounters:  01/31/18 (!) 102/50  01/24/18 (!) 147/68  01/23/18 (!) 148/78   Wt Readings from Last 3 Encounters:  01/31/18 153 lb (69.4 kg)  01/23/18 157 lb 1.9 oz (71.3 kg)  12/23/17 161 lb (73 kg)   Body mass index is 21.34 kg/m.   Physical Exam    Constitutional: Appears thin. No acute distress.  HENT:  Head: Normocephalic and atraumatic.  Neck: Neck supple. No tracheal deviation present. No thyromegaly present.  No cervical lymphadenopathy Cardiovascular: Normal rate, regular rhythm and normal heart sounds.   2/6 systolic murmur heard. No carotid bruit .  No edema Pulmonary/Chest: Effort normal and breath sounds normal. No respiratory distress. No has no wheezes. No rales.  Msk:  Right first toe with swollen, slightly erythematous, warm and tender MTP joint, first three toes numb Skin: Skin is warm and dry. Not diaphoretic.  Psychiatric: Normal mood and affect. Behavior is normal.      Assessment & Plan:    See Problem List for Assessment and Plan of chronic medical problems.

## 2018-01-31 ENCOUNTER — Encounter (HOSPITAL_COMMUNITY)
Admission: RE | Admit: 2018-01-31 | Discharge: 2018-01-31 | Disposition: A | Discharge status: Home / Self Care | Payer: Medicare Other | Source: Ambulatory Visit | Attending: Nephrology | Admitting: Nephrology

## 2018-01-31 ENCOUNTER — Ambulatory Visit (INDEPENDENT_AMBULATORY_CARE_PROVIDER_SITE_OTHER): Payer: Medicare Other | Admitting: Family Medicine

## 2018-01-31 ENCOUNTER — Ambulatory Visit (INDEPENDENT_AMBULATORY_CARE_PROVIDER_SITE_OTHER): Payer: Medicare Other | Admitting: Internal Medicine

## 2018-01-31 ENCOUNTER — Encounter: Payer: Self-pay | Admitting: Internal Medicine

## 2018-01-31 ENCOUNTER — Encounter (HOSPITAL_COMMUNITY): Payer: Medicare Other

## 2018-01-31 ENCOUNTER — Ambulatory Visit: Payer: Self-pay

## 2018-01-31 VITALS — BP 102/50 | HR 74 | Temp 97.8°F | Resp 16 | Ht 71.0 in | Wt 153.0 lb

## 2018-01-31 VITALS — Ht 71.0 in

## 2018-01-31 VITALS — BP 132/54 | HR 68 | Temp 98.3°F | Resp 20

## 2018-01-31 DIAGNOSIS — N183 Chronic kidney disease, stage 3 unspecified: Secondary | ICD-10-CM

## 2018-01-31 DIAGNOSIS — M79672 Pain in left foot: Secondary | ICD-10-CM | POA: Diagnosis not present

## 2018-01-31 DIAGNOSIS — M109 Gout, unspecified: Secondary | ICD-10-CM

## 2018-01-31 DIAGNOSIS — E538 Deficiency of other specified B group vitamins: Secondary | ICD-10-CM

## 2018-01-31 DIAGNOSIS — M791 Myalgia, unspecified site: Secondary | ICD-10-CM | POA: Diagnosis not present

## 2018-01-31 DIAGNOSIS — M7989 Other specified soft tissue disorders: Secondary | ICD-10-CM

## 2018-01-31 DIAGNOSIS — K922 Gastrointestinal hemorrhage, unspecified: Secondary | ICD-10-CM

## 2018-01-31 DIAGNOSIS — M79674 Pain in right toe(s): Secondary | ICD-10-CM

## 2018-01-31 MED ORDER — EPOETIN ALFA-EPBX 10000 UNIT/ML IJ SOLN: 5000.0000 [IU] | INTRAMUSCULAR | Status: DC

## 2018-01-31 MED ORDER — EPOETIN ALFA-EPBX 3000 UNIT/ML IJ SOLN
3000.0000 [IU] | Freq: Once | INTRAMUSCULAR | Status: AC
  Administered 2018-01-31: 3000 [IU] via SUBCUTANEOUS
  Filled 2018-01-31: qty 1

## 2018-01-31 MED ORDER — CYANOCOBALAMIN 1000 MCG/ML IJ SOLN
1000.0000 ug | Freq: Once | INTRAMUSCULAR | Status: AC
  Administered 2018-01-31: 1000 ug via INTRAMUSCULAR

## 2018-01-31 MED ORDER — EPOETIN ALFA-EPBX 2000 UNIT/ML IJ SOLN
2000.0000 [IU] | Freq: Once | INTRAMUSCULAR | Status: AC
  Administered 2018-01-31: 2000 [IU] via SUBCUTANEOUS
  Filled 2018-01-31: qty 1

## 2018-01-31 MED ORDER — PREDNISONE 10 MG PO TABS: ORAL_TABLET | ORAL | 0 refills | Status: DC

## 2018-01-31 NOTE — Progress Notes (Signed)
[name] [address], [place] [address] Phone: [phone] Subjective:    CC: Right toe pain  [id number]  [name] is a 85 y.o. male coming in with complaint of right toe pain. Patient states that pain started on 01/23/2018. Was using prednisone but finished yesterday.  Patient continues to have swelling in the toe.  Does state that it got better.  Patient is on 72 unfortunately too much pain to be able to walk.  Does not remember any true injury.  Patient is not on any chronic gout medications secondary to kidney disease.  Rates the severity of pain is 8 out of 10    Past Medical History:  Diagnosis Date  . Acute superficial venous thrombosis of lower extremity    a. RLE after CABG, neg dopp for DVT.  [name] Allergy   . Atrial fibrillation ([place])    a. Post-op from CABG, on amiodarone temporarily, d/c'd 12/2011  . CAD (coronary artery disease)    a. S/P stenting to mid RCA, prox PDA 06/1999. b. NSTEMI/CABG x 3 in 10/2011 with LIMA to LAD, SVG to PDA, and SVG to OM1.   . Chronic UTI    a. Followed by Dr. Risa Grill - colonized/asymptomatic - not on abx  . CKD (chronic kidney disease), stage III (HCC)    a. stable with a creatinine around 1.9-2.0 followed by nephrology.  . Dyslipidemia   . GERD (gastroesophageal reflux disease)   . Hypertension    well-controlled.  . Myocardial infarction ([place])   . Neck injury    a. C3-C4 and C4-C5 foraminal narrowing, severe  . Prostate cancer ([place])    a. 2001 s/p TURP.  . Pulmonary nodule    a. felt to be noncancerous.  Status post followup CT scan 4 mm and stable.  . Renal artery stenosis ([place])    a. 50% by cath 2001  . 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 cardia catherization  07-07-99  . CARDIAC SURGERY  10/18/12   open heart surgery  . CATARACT EXTRACTION W/ INTRAOCULAR LENS  IMPLANT, BILATERAL   3/205, 06/2013   m[facility]uen  . COLONOSCOPY  04/12/07  . CORONARY ARTERY BYPASS GRAFT  10/19/2011   Procedure: CORONARY ARTERY BYPASS GRAFTING (CABG);  Surgeon: Gaye Pollack, MD;  Location: [place];  Service: Open Heart Surgery;  Laterality: N/A;  times three using Left Internal Mammary Artery and Right Greater Saphenouse Vein Graft harvested Endoscopically  . edg  07-17-1994  . FLEXIBLE SIGMOIDOSCOPY  11-03-1997  . LEFT HEART CATHETERIZATION WITH CORONARY ANGIOGRAM N/A 10/16/2011   Procedure: LEFT HEART CATHETERIZATION WITH CORONARY ANGIOGRAM;  Surgeon: Peter M Martinique, MD;  Location: [facility] CATH LAB;  Service: Cardiovascular;  Laterality: N/A;  . LEFT HEART CATHETERIZATION WITH CORONARY/GRAFT ANGIOGRAM N/A 03/11/2013   Procedure: LEFT HEART CATHETERIZATION WITH [name];  Surgeon: Blane Ohara, MD;  Location: [facility] CATH LAB;  Service: Cardiovascular;  Laterality: N/A;  . lumbar spinal disk and neck fusion surgery    . PACEMAKER INSERTION  03/28/12   PPM implanted for mobitz II AV block  . peripheral vascular catherization  11-24-03  . PERMANENT PACEMAKER INSERTION N/A 03/28/2012   Procedure: PERMANENT PACEMAKER INSERTION;  Surgeon: Thompson Grayer, MD;  Location: [facility] CATH LAB;  Service: Cardiovascular;  Laterality: N/A;  . PROSTATECTOMY    . renal circulation  10-01-03  .  s/p ptca    . stents     X 2  . stress cardiolite  05-04-05   spring 09-negative except for apical thinning, EF 68%   Social History   Socioeconomic History  . Marital status: Married    Spouse name: [name]  . Number of children: 2  . Years of education: Not on file  . Highest education level: Not on file  Occupational History  . 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 Financial resource strain: Not on file  . Food insecurity:    Worry: Not on file     Inability: Not on file  . Transportation needs:    Medical: Not on file    Non-medical: Not on file  Tobacco Use  . Smoking status: Never Smoker  . Smokeless tobacco: Never Used  Substance and Sexual Activity  . Alcohol use: Yes    Comment: Rarely  . Drug use: No  . Sexual activity: Not on file  Lifestyle  . Physical activity:    Days per week: Not on file    Minutes per session: Not on file  . Stress: Not on file  Relationships  . Social connections:    Talks on phone: Not on file    Gets together: Not on file    Attends religious service: Not on file    Active member of club or organization: Not on file    Attends meetings of clubs or organizations: Not on file    Relationship status: Not on file  Other Topics Concern  . Not on file  Social History Narrative   HSG, 1 year college.  married '52 - 3 years, divorced; married '56 - 3 years divorced; married '11-12 yrs - divorced; married '75 -. 1 son '57; 1 daughter - '53; 1 grandchild.  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  Very happily married.  End of life care: yes CPR, no long term mechanical ventilation, no heroic measures.   Allergies  Allergen Reactions  . Aspirin Other (See Comments) and Nausea And Vomiting    REACTION: upset stomach; tolerates coated aspirin REACTION: upset stomach; tolerates coated aspirin   . Amoxicillin Nausea Only  . Atarax [Hydroxyzine Hcl] Other (See Comments)    REACTION: unknown  . Cephalexin Diarrhea  . Ciprofloxacin Nausea Only  . Clindamycin   . Clobetasol     Non effective  . Codeine     REACTION: Stomach upset  . Fluarix [Flu Virus Vaccine] Itching  . Haemophilus Influenzae Itching  . Hydrocodone Nausea Only  . Hydrocodone-Acetaminophen Nausea And Vomiting  . Hydroxyzine Nausea And Vomiting  . Latex Itching    After flu shot  . Lisinopril     After 3 tablets, he experienced trouble swallowing, throat tightness and hoarseness.     Santiago Bur [Nitrofurantoin Macrocrystal] Nausea Only  . Niacin Other (See Comments)    REACTION: unknown  . Niacin-Lovastatin Er     Unsure of reaction. Taking simvastatin at home without problems  . Niacin-Lovastatin Er   . Nitrofurantoin Other (See Comments)    REACTION: upset stomach REACTION: upset stomach  . Vibramycin  [Doxycycline]   . Bactrim [Sulfamethoxazole-Trimethoprim] Rash  . Colchicine Rash   Family History  Problem Relation Age of Onset  . Coronary  artery disease Father        died @ 2  . Other Mother        cerebral hemorrhage - died @ 64  . Arthritis Mother   . Cancer Brother        Bladder  . Prostate cancer Brother   . Nephritis Brother        died @ age 62.  . Other Brother        cerebral hemorrhage - died @ 9  . Heart disease Brother   . Breast cancer Other        niece x 2  . Diabetes Neg Hx   . Colon cancer Neg Hx   . Adrenal disorder Neg Hx     Current Outpatient Medications (Endocrine & Metabolic):  .  predniSONE (DELTASONE) 10 MG tablet, Take 4 tablets once daily for 2 days, 3 tabs daily for 2 days, 2 tabs daily for 2 days, 1 tab daily for 2 days.  Current Outpatient Medications (Cardiovascular):  .  furosemide (LASIX) 40 MG tablet, Take 1 tablet (40 mg total) by mouth 3 (three) times a week. .  metoprolol succinate (TOPROL-XL) 25 MG 24 hr tablet, Take 1 tablet (25 mg total) by mouth at bedtime. .  nitroGLYCERIN (NITROSTAT) 0.4 MG SL tablet, DISSOLVE 1 TABLET UNDER THE TONGUE EVERY 5 MINUTES AS NEEDED FOR CHEST PAIN (MAX OF 3 TABLETS) .  simvastatin (ZOCOR) 20 MG tablet, TAKE 1 TABLET DAILY (ANNUAL APPOINTMENT DUE IN [place]. MUST SEE DOCTOR FOR REFILLS)   Current Outpatient Medications (Analgesics):  .  acetaminophen (TYLENOL) 500 MG tablet, Take 500 mg by mouth every 6 (six) hours as needed. [name]  aspirin EC 81 MG tablet, Take 1 tablet (81 mg total) by mouth daily.  Current Outpatient Medications (Hematological):  .  folic acid (FOLVITE)  1 MG tablet, TAKE 1 TABLET DAILY .  vitamin B-12 (CYANOCOBALAMIN) 500 MCG tablet, Take 500 mcg by mouth daily. Take one tablet every 3rd day  Current Outpatient Medications (Other):  .  carboxymethylcellulose (REFRESH PLUS) 0.5 % SOLN, Place 1 drop into both eyes daily as needed (dry eyes). .  cimetidine (TAGAMET) 300 MG tablet,  .  DiphenhydrAMINE HCl, Sleep, (ZZZQUIL PO), Take 1 Dose by mouth at bedtime as needed (sleep). .  ergocalciferol (VITAMIN D2) 50000 UNITS capsule, Take 50,000 Units by mouth every 30 (thirty) days. 15 th of each month .  famotidine (PEPCID) 40 MG tablet,  .  triamcinolone cream (KENALOG) 0.1 %, Apply 1 application topically 2 (two) times daily. .  vitamin C (ASCORBIC ACID) 500 MG tablet, Take 1,000 mg by mouth daily.     Past medical history, social, surgical and family history all reviewed in electronic medical record.  No pertanent information unless stated regarding to the chief complaint.   Review of Systems:  No headache, visual changes, nausea, vomiting, diarrhea, constipation, dizziness, abdominal pain, skin rash, fevers, chills, night sweats, weight loss, swollen lymph nodes, body aches,  muscle aches, chest pain, shortness of breath, mood changes.  Positive joint swelling  Objective  Height 5\' 11"  (1.803 m).    General: No apparent distress alert and oriented x3 mood and affect normal, dressed appropriately.  HEENT: Pupils equal, extraocular movements intact  Respiratory: Patient's speak in full sentences and does not appear short of breath  Cardiovascular: No lower extremity edema, non tender, no erythema  Skin: Warm dry intact with no signs of infection or rash on extremities or on axial skeleton.  Abdomen: Soft nontender  Neuro: Cranial nerves II through XII are intact, neurovascularly intact in all extremities with 2+ DTRs and 2+ pulses.  Lymph: No lymphadenopathy of posterior or anterior cervical chain or axillae bilaterally.  Gait severely  antalgic.  MSK:  tender with mild limited range of motion and good stability and symmetric strength and tone of shoulders, elbows, wrist, hip, knee and ankles bilaterally.  Right foot exam shows the patient does have significant erythema and swelling of the first metatarsal.  Severely tender to palpation.  Neurovascular intact distally.  Breakdown of the transverse arch noted.  Patient has full range of motion of the ankle.  Negative Tinel sign.  Negative Thompson test no pain of the calf with compression  Procedure: Real-time Ultrasound Guided Injection of right first MTP Device: GE Logiq Q7 Ultrasound guided injection is preferred based studies that show increased duration, increased effect, greater accuracy, decreased procedural pain, increased response rate, and decreased cost with ultrasound guided versus blind injection.  Verbal informed consent obtained.  Time-out conducted.  Noted no overlying erythema, induration, or other signs of local infection.  Skin prepped in a sterile fashion.  Local anesthesia: Topical Ethyl chloride.  With sterile technique and under real time ultrasound guidance: With a 25-gauge half inch needle injected with 0.5 cc of 0.5% Marcaine and 0.5 cc of Kenalog 40 mg/dL tolerated the procedure well Completed without difficulty  Pain immediately resolved suggesting accurate placement of the medication.  Advised to call if fevers/chills, erythema, induration, drainage, or persistent bleeding.  Images permanently stored and available for review in the ultrasound unit.  Impression: Technically successful ultrasound guided injection.    Impression and Recommendations:     This case required medical decision making of moderate complexity. The above documentation has been reviewed and is accurate and complete Hulan Saas.       Note: This dictation was prepared with Dragon dictation along with smaller phrase technology. Any transcriptional errors that result from  this process are unintentional.

## 2018-01-31 NOTE — Patient Instructions (Addendum)
Follow up with me in about two weeks.

## 2018-01-31 NOTE — Assessment & Plan Note (Signed)
No sign of infectious etiology.  Steroid injection given today.  Discussed icing regimen and home exercise.  Discussed topical anti-inflammatories.  Patient will increase activity slowly over the course of next several days.  Discussed proper shoe wear.  Topical anti-inflammatories given.  Due to patient's chronic kidney disease would consider uloric if needed but we will monitor.  Follow-up again in 2 weeks

## 2018-01-31 NOTE — Patient Instructions (Signed)
Good to see you  Tart cherry extract any dose at night  Avoid being barefoot when you can  Should get better quickly  Have an a[ppointment set in in 2 weeks just in case

## 2018-01-31 NOTE — Assessment & Plan Note (Addendum)
B12 deficiency  Active UGIB would benefit from B12 injection to help with absorption Restart B12 injection -- injection more effective than po Will restart monthly injections - first today

## 2018-02-01 DIAGNOSIS — K922 Gastrointestinal hemorrhage, unspecified: Secondary | ICD-10-CM | POA: Insufficient documentation

## 2018-02-01 DIAGNOSIS — M7989 Other specified soft tissue disorders: Secondary | ICD-10-CM

## 2018-02-01 DIAGNOSIS — M79674 Pain in right toe(s): Secondary | ICD-10-CM | POA: Insufficient documentation

## 2018-02-01 HISTORY — DX: Gastrointestinal hemorrhage, unspecified: K92.2

## 2018-02-01 NOTE — Assessment & Plan Note (Signed)
Gout of R MTP  Improved after prednisone taper, but still with gout symptoms Would benefit from additional steroids, but given active UGIB will have sports med give him an injection into joint

## 2018-02-01 NOTE — Assessment & Plan Note (Signed)
Anemia, black stools Has EGD next week Having iron injections - second one today

## 2018-02-01 NOTE — Assessment & Plan Note (Signed)
Still with muscle pain and joint pain  No improvement after statin stopped Will f/u with Dr Amil Amen next week ? PMR

## 2018-02-02 ENCOUNTER — Emergency Department (HOSPITAL_COMMUNITY)
Admission: EM | Admit: 2018-02-02 | Discharge: 2018-02-02 | Disposition: A | Discharge status: Home / Self Care | Payer: Medicare Other | Attending: Emergency Medicine | Admitting: Emergency Medicine

## 2018-02-02 ENCOUNTER — Emergency Department (HOSPITAL_COMMUNITY): Payer: Medicare Other

## 2018-02-02 ENCOUNTER — Encounter (HOSPITAL_COMMUNITY): Payer: Self-pay | Admitting: Emergency Medicine

## 2018-02-02 ENCOUNTER — Other Ambulatory Visit: Payer: Self-pay

## 2018-02-02 DIAGNOSIS — I129 Hypertensive chronic kidney disease with stage 1 through stage 4 chronic kidney disease, or unspecified chronic kidney disease: Secondary | ICD-10-CM | POA: Insufficient documentation

## 2018-02-02 DIAGNOSIS — R51 Headache: Secondary | ICD-10-CM | POA: Insufficient documentation

## 2018-02-02 DIAGNOSIS — D649 Anemia, unspecified: Secondary | ICD-10-CM | POA: Insufficient documentation

## 2018-02-02 DIAGNOSIS — Z8546 Personal history of malignant neoplasm of prostate: Secondary | ICD-10-CM | POA: Diagnosis not present

## 2018-02-02 DIAGNOSIS — N289 Disorder of kidney and ureter, unspecified: Secondary | ICD-10-CM

## 2018-02-02 DIAGNOSIS — I252 Old myocardial infarction: Secondary | ICD-10-CM | POA: Diagnosis not present

## 2018-02-02 DIAGNOSIS — Z79899 Other long term (current) drug therapy: Secondary | ICD-10-CM | POA: Insufficient documentation

## 2018-02-02 DIAGNOSIS — N183 Chronic kidney disease, stage 3 (moderate): Secondary | ICD-10-CM | POA: Insufficient documentation

## 2018-02-02 DIAGNOSIS — R531 Weakness: Secondary | ICD-10-CM | POA: Diagnosis present

## 2018-02-02 DIAGNOSIS — I2510 Atherosclerotic heart disease of native coronary artery without angina pectoris: Secondary | ICD-10-CM | POA: Insufficient documentation

## 2018-02-02 DIAGNOSIS — Z91040 Latex allergy status: Secondary | ICD-10-CM | POA: Insufficient documentation

## 2018-02-02 LAB — CBC WITH DIFFERENTIAL/PLATELET
Abs Immature Granulocytes: 0.05 10*3/uL (ref 0.00–0.07)
Basophils Absolute: 0 10*3/uL (ref 0.0–0.1)
Basophils Relative: 0 %
Eosinophils Absolute: 0.1 10*3/uL (ref 0.0–0.5)
Eosinophils Relative: 1 %
HEMATOCRIT: 33.7 % — AB (ref 39.0–52.0)
Hemoglobin: 10.1 g/dL — ABNORMAL LOW (ref 13.0–17.0)
Immature Granulocytes: 1 %
LYMPHS ABS: 1 10*3/uL (ref 0.7–4.0)
Lymphocytes Relative: 14 %
MCH: 22.5 pg — AB (ref 26.0–34.0)
MCHC: 30 g/dL (ref 30.0–36.0)
MCV: 75.1 fL — ABNORMAL LOW (ref 80.0–100.0)
Monocytes Absolute: 1.2 10*3/uL — ABNORMAL HIGH (ref 0.1–1.0)
Monocytes Relative: 16 %
Neutro Abs: 5.1 10*3/uL (ref 1.7–7.7)
Neutrophils Relative %: 68 %
Platelets: 401 10*3/uL — ABNORMAL HIGH (ref 150–400)
RBC: 4.49 MIL/uL (ref 4.22–5.81)
RDW: 16.3 % — ABNORMAL HIGH (ref 11.5–15.5)
WBC: 7.5 10*3/uL (ref 4.0–10.5)
nRBC: 0 % (ref 0.0–0.2)

## 2018-02-02 LAB — SAMPLE TO BLOOD BANK

## 2018-02-02 LAB — COMPREHENSIVE METABOLIC PANEL
ALK PHOS: 51 U/L (ref 38–126)
ALT: 15 U/L (ref 0–44)
AST: 21 U/L (ref 15–41)
Albumin: 3.6 g/dL (ref 3.5–5.0)
Anion gap: 9 (ref 5–15)
BUN: 44 mg/dL — ABNORMAL HIGH (ref 8–23)
CO2: 23 mmol/L (ref 22–32)
Calcium: 9 mg/dL (ref 8.9–10.3)
Chloride: 105 mmol/L (ref 98–111)
Creatinine, Ser: 2.44 mg/dL — ABNORMAL HIGH (ref 0.61–1.24)
GFR calc Af Amer: 26 mL/min — ABNORMAL LOW (ref >60)
GFR calc non Af Amer: 23 mL/min — ABNORMAL LOW (ref >60)
Glucose, Bld: 75 mg/dL (ref 70–99)
Potassium: 4.6 mmol/L (ref 3.5–5.1)
Sodium: 137 mmol/L (ref 135–145)
Total Bilirubin: 0.9 mg/dL (ref 0.3–1.2)
Total Protein: 6.7 g/dL (ref 6.5–8.1)

## 2018-02-02 LAB — TSH: TSH: 1.827 u[IU]/mL (ref 0.350–4.500)

## 2018-02-02 LAB — URINALYSIS, ROUTINE W REFLEX MICROSCOPIC
Bilirubin Urine: NEGATIVE
Glucose, UA: NEGATIVE mg/dL
Hgb urine dipstick: NEGATIVE
Ketones, ur: NEGATIVE mg/dL
Leukocytes, UA: NEGATIVE
Nitrite: NEGATIVE
PH: 6 (ref 5.0–8.0)
Protein, ur: NEGATIVE mg/dL
Specific Gravity, Urine: 1.017 (ref 1.005–1.030)

## 2018-02-02 MED ORDER — SODIUM CHLORIDE 0.9 % IV BOLUS
500.0000 mL | Freq: Once | INTRAVENOUS | Status: AC
  Administered 2018-02-02: 500 mL via INTRAVENOUS

## 2018-02-02 NOTE — ED Triage Notes (Signed)
Per EMS- pt picked up from, reports weaknes x3-4weeks. Pt has evaluated in the ED on Friday and given b12.

## 2018-02-02 NOTE — ED Provider Notes (Signed)
[facility] EMERGENCY DEPARTMENT Provider Note   CSN: [id number] Arrival date & time: 02/02/18  1429     History   Chief Complaint Chief Complaint  Patient presents with  . Weakness    HPI [name] is a 85 y.o. male.  HPI Patient presents with generalized weakness and now more unsteadiness.  Has had some of the symptoms for the last 2 months.  Has lost around 12 pounds.  Has been anemic.  Has had some sort of injection to help get his hemoglobin up.  Also gout in the foot.  States he is had GI bleeding.  Scheduled for upper GI.  Has some dull headaches.  States today he was more unsteady and feels as if he is going to pass out.  No abdominal pain.  No fevers.  Does not know if his thyroid is been checked.  Does have history of prostate cancer but states that his PSA has been going down.  Not a smoker.  No cough.  No sore throat.  States he is recently been diagnosed with rheumatoid arthritis.  Also has had gout in his foot had a localized injection. Past Medical History:  Diagnosis Date  . Acute superficial venous thrombosis of lower extremity    a. RLE after CABG, neg dopp for DVT.  [name] Allergy   . Atrial fibrillation ([place])    a. Post-op from CABG, on amiodarone temporarily, d/c'd 12/2011  . CAD (coronary artery disease)    a. S/P stenting to mid RCA, prox PDA 06/1999. b. NSTEMI/CABG x 3 in 10/2011 with LIMA to LAD, SVG to PDA, and SVG to OM1.   . Chronic UTI    a. Followed by Dr. Risa Grill - colonized/asymptomatic - not on abx  . CKD (chronic kidney disease), stage III (HCC)    a. stable with a creatinine around 1.9-2.0 followed by nephrology.  . Dyslipidemia   . GERD (gastroesophageal reflux disease)   . Hypertension    well-controlled.  . Myocardial infarction ([place])   . Neck injury    a. C3-C4 and C4-C5 foraminal narrowing, severe  . Prostate cancer ([place])    a. 2001 s/p TURP.  . Pulmonary nodule    a. felt to be noncancerous.  Status post followup  CT scan 4 mm and stable.  . Renal artery stenosis ([place])    a. 50% by cath 2001  . 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 Pain and swelling of toe of right foot 02/01/2018  . UGIB (upper gastrointestinal bleed) 02/01/2018  . Hyperpigmentation 11/04/2017  . Abnormal appearance of skin 10/07/2017  . Frequent urination 09/03/2017  . Gouty arthritis of right great toe 09/03/2017  . Postnasal drip 08/06/2017  . Rash and nonspecific skin eruption 08/06/2017  . Moderate aortic regurgitation 07/04/2017  . Diastolic dysfunction 07/04/9483  . Edema 06/29/2017  . DOE (dyspnea on exertion) 06/29/2017  . Sinus symptom 06/05/2017  . Fatigue 05/18/2017  . Sleeping difficulty 05/18/2017  . Foot pain, bilateral 04/22/2017  . Trochanteric bursitis 01/10/2017  . Internal hemorrhoids 08/15/2016  . Loose stools 02/06/2016  . Dysphagia 02/06/2016  . Cephalalgia 07/14/2015  . Constipation 03/01/2015  . Myalgia 03/02/2014  . CHB (complete heart block) ([place]) 05/03/2012  . Postoperative atrial fibrillation ([place]) 05/03/2012  . Pacemaker 04/10/2012  . 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 Coronary  atherosclerosis 11/27/2011  . Presence of aortocoronary bypass graft 10/24/2011  . Pulmonary nodule   . CAD - CABG Oct 2013   . Chronic renal impairment, stage 3 (moderate) (HCC)   . Venous insufficiency of leg 06/05/2010  . SHOULDER PAIN, RIGHT, CHRONIC 10/14/2009  . B12 deficiency 08/23/2009  . Peripheral neuropathy 03/21/2009  . DEGENERATIVE DISC DISEASE, CERVICAL SPINE, W/RADICULOPATHY 09/21/2008  . Dyslipidemia 01/17/2007  . Essential hypertension 07/20/2006  . Prostate cancer ([place]) 07/20/2006    Past Surgical History:  Procedure Laterality Date  . cardia catherization  07-07-99  . CARDIAC SURGERY  10/18/12   open heart surgery  . CATARACT EXTRACTION W/ INTRAOCULAR LENS   IMPLANT, BILATERAL  3/205, 06/2013   [facility]  . COLONOSCOPY  04/12/07  . CORONARY ARTERY BYPASS GRAFT  10/19/2011   Procedure: CORONARY ARTERY BYPASS GRAFTING (CABG);  Surgeon: Gaye Pollack, MD;  Location: [facility];  Service: Open Heart Surgery;  Laterality: N/A;  times three using Left Internal Mammary Artery and Right Greater [place]hen[place] Vein Graft harvested Endoscopically  . edg  07-17-1994  . FLEXIBLE SIGMOIDOSCOPY  11-03-1997  . LEFT HEART CATHETERIZATION WITH CORONARY ANGIOGRAM N/A 10/16/2011   Procedure: LEFT HEART CATHETERIZATION WITH CORONARY ANGIOGRAM;  Surgeon: Peter M Martinique, MD;  Location: [facility] CATH LAB;  Service: Cardiovascular;  Laterality: N/A;  . LEFT HEART CATHETERIZATION WITH CORONARY/GRAFT ANGIOGRAM N/A 03/11/2013   Procedure: LEFT HEART CATHETERIZATION WITH [name];  Surgeon: Blane Ohara, MD;  [facility];  Service: Cardiovascular;  Laterality: N/A;  . lumbar spinal disk and neck fusion surgery    . PACEMAKER INSERTION  03/28/12   PPM implanted for mobitz II AV block  . peripheral vascular catherization  11-24-03  . PERMANENT PACEMAKER INSERTION N/A 03/28/2012   Procedure: PERMANENT PACEMAKER INSERTION;  Surgeon: Thompson Grayer, MD;  Location: [facility] C[facility] LAB;  Service: Cardiovascular;  Laterality: N/A;  . PROSTATECTOMY    . renal circulation  10-01-03  . s/p ptca    . stents     X 2  . stress cardiolite  05-04-05   spring 09-negative except for apical thinning, EF 68%        Home Medications    Prior to Admission medications   Medication Sig Start Date End Date Taking? Authorizing Provider  aspirin EC 81 MG tablet Take 1 tablet (81 mg total) by mouth daily. 09/02/15  Yes [name], MD  cimetidine (TAGAMET) 300 MG tablet Take 300 mg by mouth 2 (two) times daily.  01/20/18  Yes [provider]  folic acid (FOLVITE) 1 MG tablet TAKE 1 TABLET DAILY Patient taking differently: Take 1 mg by mouth daily.  12/11/17  Yes Burns, Claudina Lick, MD    furosemide (LASIX) 40 MG tablet Take 1 tablet (40 mg total) by mouth 3 (three) times a week. 11/27/17 11/22/18 Yes [name], MD  metoprolol succinate (TOPROL-XL) 25 MG 24 hr tablet Take 1 tablet (25 mg total) by mouth at bedtime. 04/05/17 03/31/18 Yes [name], MD  Multiple Vitamins-Minerals (PRESERVISION AREDS 2 PO) Take 1 tablet by mouth daily.   Yes [provider]  vitamin C (ASCORBIC ACID) 500 MG tablet Take 1,000 mg by mouth daily.    Yes [provider]  carboxymethylcellulose (REFRESH PLUS) 0.5 % SOLN Place 1 drop into both eyes daily as needed (dry eyes). Patient not taking: Reported on 02/02/2018 01/25/14   Rowe Clack, MD  nitroGLYCERIN (NITROSTAT) 0.4 MG SL tablet DISSOLVE 1 TABLET UNDER THE  TONGUE EVERY 5 MINUTES AS NEEDED FOR CHEST PAIN (MAX OF 3 TABLETS) Patient not taking: Reported on 02/02/2018 11/28/17   [name], MD  predniSONE (DELTASONE) 10 MG tablet Take 4 tablets once daily for 2 days, 3 tabs daily for 2 days, 2 tabs daily for 2 days, 1 tab daily for 2 days. Patient not taking: Reported on 02/02/2018 01/31/18   [name], MD  simvastatin (ZOCOR) 20 MG tablet TAKE 1 TABLET DAILY ([place]. MUST SEE DOCTOR FOR REFILLS) Patient not taking: Reported on 02/02/2018 12/11/17   [name], MD  triamcinolone cream (KENALOG) 0.1 % Apply 1 application topically 2 (two) times daily. Patient not taking: Reported on 02/02/2018 08/06/17   [name], MD    Family History Family History  Problem Relation Age of Onset  . Coronary artery disease Father        died @ 72  . Other Mother        cerebral hemorrhage - died @ 79  . Arthritis Mother   . Cancer Brother        Bladder  . Prostate cancer Brother   . Nephritis Brother        died @ age 43.  . Other Brother        cerebral hemorrhage - died @ 68  . Heart disease Brother   . Breast cancer Other        niece x 2  . Diabetes Neg Hx   . Colon cancer Neg  Hx   . Adrenal disorder Neg Hx     Social History Social History   Tobacco Use  . Smoking status: Never Smoker  . Smokeless tobacco: Never Used  Substance Use Topics  . Alcohol use: Yes    Comment: Rarely  . Drug use: No     Allergies   Aspirin; Amoxicillin; Atarax [hydroxyzine hcl]; Cephalexin; Ciprofloxacin; Clindamycin; Clobetasol; Codeine; Fluarix [flu virus vaccine]; Haemophilus influenzae; Hydrocodone; Hydrocodone-acetaminophen; Hydroxyzine; Latex; Lisinopril; Macrobid [nitrofurantoin macrocrystal]; Niacin; Niacin-lovastatin er; Niacin-lovastatin er; Nitrofurantoin; Vibramycin  [doxycycline]; Bactrim [sulfamethoxazole-trimethoprim]; and Colchicine   Review of Systems Review of Systems  Constitutional: Positive for appetite change and unexpected weight change.  HENT: Negative for congestion.   Respiratory: Negative for shortness of breath.   Cardiovascular: Negative for chest pain.  Gastrointestinal: Negative for abdominal pain.       Stool had been black but has been better the last couple days.  Genitourinary: Negative for frequency.  Musculoskeletal: Positive for gait problem. Negative for back pain.  Skin: Positive for rash. Negative for wound.       Had previous rash on abdomen and legs but now resolved.  Neurological: Positive for weakness and headaches. Negative for syncope.  Psychiatric/Behavioral: Negative for confusion.     Physical Exam Updated Vital Signs BP (!) 105/55   Pulse 66   Temp 97.8 F (36.6 C) (Oral)   Resp 16   Ht 5\' 11"  (1.803 m)   Wt 69.4 kg   SpO2 100%   BMI 21.34 kg/m   Physical Exam HENT:     Head: Atraumatic.     Mouth/Throat:     Mouth: Mucous membranes are moist.  Eyes:     Pupils: Pupils are equal, round, and reactive to light.  Neck:     Musculoskeletal: Neck supple.  Pulmonary:     Effort: Pulmonary effort is normal.  Abdominal:     Tenderness: There is no abdominal tenderness.  Musculoskeletal:  Right lower  leg: No edema.     Left lower leg: No edema.  Skin:    Capillary Refill: Capillary refill takes less than 2 seconds.     Coloration: Skin is pale.  Neurological:     General: No focal deficit present.     Mental Status: He is alert.      ED Treatments / Results  Labs (all labs ordered are listed, but only abnormal results are displayed) Labs Reviewed  COMPREHENSIVE METABOLIC PANEL - Abnormal; Notable for the following components:      Result Value   BUN 44 (*)    Creatinine, Ser 2.44 (*)    GFR calc non Af Amer 23 (*)    GFR calc Af Amer 26 (*)    All other components within normal limits  CBC WITH DIFFERENTIAL/PLATELET - Abnormal; Notable for the following components:   Hemoglobin 10.1 (*)    HCT 33.7 (*)    MCV 75.1 (*)    MCH 22.5 (*)    RDW 16.3 (*)    Platelets 401 (*)    Monocytes Absolute 1.2 (*)    All other components within normal limits  TSH  URINALYSIS, ROUTINE W REFLEX MICROSCOPIC  SAMPLE TO BLOOD BANK    EKG None  Radiology Ct Abdomen Pelvis Wo Contrast  Result Date: 02/02/2018 CLINICAL DATA:  Weakness and abdominal pain EXAM: CT ABDOMEN AND PELVIS WITHOUT CONTRAST TECHNIQUE: Multidetector CT imaging of the abdomen and pelvis was performed following the standard protocol without IV contrast. COMPARISON:  09/27/2017 FINDINGS: Lower chest: No acute abnormality. Hepatobiliary: No focal liver abnormality is seen. No gallstones, gallbladder wall thickening, or biliary dilatation. Pancreas: Unremarkable. No pancreatic ductal dilatation or surrounding inflammatory changes. Spleen: Normal in size without focal abnormality. Adrenals/Urinary Tract: Adrenal glands are within normal limits. No renal calculi are noted. Bilateral renal cysts are seen stable from the previous exam. Focal hyperdensity is noted along the medial lower lobe also stable from the prior exam likely representing a hyperdense cyst. No obstructive changes are seen. The bladder is well distended.  Stomach/Bowel: The appendix is not well visualized. No inflammatory changes are identified. No obstructive or inflammatory changes of the colon or small bowel are seen. The stomach is within normal limits. Vascular/Lymphatic: Aortic atherosclerosis. No enlarged abdominal or pelvic lymph nodes. Reproductive: Prostate has been surgically removed consistent with the given clinical history. Other: No abdominal wall hernia or abnormality. No abdominopelvic ascites. Musculoskeletal: Degenerative changes of the lumbar spine are noted. No acute bony abnormality is seen. IMPRESSION: Stable changes in the kidneys bilaterally. No acute abnormality is identified correspond with the given clinical history. Electronically Signed   By: Inez Catalina M.D.   On: 02/02/2018 20:07   Dg Chest 2 View  Result Date: 02/02/2018 CLINICAL DATA:  Weakness for the past 3-4 weeks. Weight loss. EXAM: CHEST - 2 VIEW COMPARISON:  12/01/2014. FINDINGS: Normal sized heart. Tortuous and mildly calcified thoracic aorta. Stable left subclavian pacemaker leads and post CABG changes. Stable minimal linear scarring at the left lung base. Otherwise, clear lungs. The lungs remain mildly hyperexpanded. Mild thoracic spine degenerative changes. IMPRESSION: No acute abnormality. Stable mild changes of COPD. Electronically Signed   By: Claudie Revering M.D.   On: 02/02/2018 16:24   Ct Head Wo Contrast  Result Date: 02/02/2018 CLINICAL DATA:  Weakness for several weeks EXAM: CT HEAD WITHOUT CONTRAST TECHNIQUE: Contiguous axial images were obtained from the base of the skull through the vertex without intravenous contrast.  COMPARISON:  03/22/17 FINDINGS: Brain: Chronic atrophic changes and white matter ischemic changes are seen. No acute infarct, acute hemorrhage or space-occupying mass lesion is seen. Vascular: No hyperdense vessel or unexpected calcification. Skull: Normal. Negative for fracture or focal lesion. Sinuses/Orbits: No acute finding. Other: None.  IMPRESSION: Chronic atrophic and ischemic changes without acute abnormality. Electronically Signed   By: Inez Catalina M.D.   On: 02/02/2018 19:58    Procedures Procedures (including critical care time)  Medications Ordered in ED Medications  sodium chloride 0.9 % bolus 500 mL (0 mLs Intravenous Stopped 02/02/18 1938)     Initial Impression / Assessment and Plan / ED Course  I have reviewed the triage vital signs and the nursing notes.  Pertinent labs & imaging results that were available during my care of the patient were reviewed by me and considered in my medical decision making (see chart for details).     Patient with generalized weakness.  Has had a somewhat extensive work-up already but no cause found.  CT abdomen pelvis and head CT done.  No contrast done due to worsening renal function.  Creatinine mildly increased but has a nephrologist, Dr. Justin Mend.  May be a little dehydrated and fluid bolus given.  Blood pressure improved.  No clear cause found for the weakness.  TSH reassuring.  Has follow-up this week with 3 different providers.  Will discharge home.  Final Clinical Impressions(s) / ED Diagnoses   Final diagnoses:  Generalized weakness  Renal insufficiency  Anemia, unspecified type    ED Discharge Orders    None       Davonna Belling, MD 02/02/18 2118

## 2018-02-02 NOTE — ED Notes (Signed)
Patient transported to CT 

## 2018-02-02 NOTE — ED Notes (Signed)
Patient verbalizes understanding of discharge instructions. Opportunity for questioning and answers were provided. Armband removed by staff, pt discharged from ED via wheelchair to home.  

## 2018-02-03 LAB — POCT HEMOGLOBIN-HEMACUE: Hemoglobin: 9.2 g/dL — ABNORMAL LOW (ref 13.0–17.0)

## 2018-02-05 ENCOUNTER — Encounter: Payer: Self-pay | Admitting: Physician Assistant

## 2018-02-05 ENCOUNTER — Ambulatory Visit (INDEPENDENT_AMBULATORY_CARE_PROVIDER_SITE_OTHER): Payer: Medicare Other | Admitting: Physician Assistant

## 2018-02-05 VITALS — BP 126/68 | HR 83 | Ht 71.0 in | Wt 152.4 lb

## 2018-02-05 DIAGNOSIS — I10 Essential (primary) hypertension: Secondary | ICD-10-CM

## 2018-02-05 DIAGNOSIS — N183 Chronic kidney disease, stage 3 unspecified: Secondary | ICD-10-CM

## 2018-02-05 DIAGNOSIS — Z950 Presence of cardiac pacemaker: Secondary | ICD-10-CM

## 2018-02-05 DIAGNOSIS — R5383 Other fatigue: Secondary | ICD-10-CM

## 2018-02-05 DIAGNOSIS — I25119 Atherosclerotic heart disease of native coronary artery with unspecified angina pectoris: Secondary | ICD-10-CM

## 2018-02-05 DIAGNOSIS — I5032 Chronic diastolic (congestive) heart failure: Secondary | ICD-10-CM

## 2018-02-05 DIAGNOSIS — I351 Nonrheumatic aortic (valve) insufficiency: Secondary | ICD-10-CM | POA: Diagnosis not present

## 2018-02-05 HISTORY — DX: Chronic diastolic (congestive) heart failure: I50.32

## 2018-02-05 MED ORDER — FUROSEMIDE 40 MG PO TABS: 40.0000 mg | ORAL_TABLET | ORAL | 3 refills | Status: DC | PRN

## 2018-02-05 NOTE — Progress Notes (Signed)
Cardiology Office Note:    Date:  02/05/2018   ID:  [name], DOB [birth year], MRN [id number]  PCP:  [name], MD  Cardiologist:  [name], MD  Electrophysiologist:  None  Nephrologist:  Dr. Justin Mend  Referring MD: [name], MD   Chief Complaint  Patient presents with  . Follow-up    CAD, CHF    History of Present Illness:    [name] is a 85 y.o. male with coronary artery disease s/p stenting to the RCA in 2001 and NSTEMI tx with CABG in 4403, diastolic CHF, chronic kidney disease, hyperlipidemia, hypertension, prostate cancer, symptomatic bradycardia s/p pacer in 2014.  Cardiac Catheterization in 2015 demonstrated a patent L-LAD and early failure of all of his vein grafts.  He has been managed medically.  He was last seen by Dr. Burt Knack in 10/2017.  He complained of shortness of breath and leg swelling.  His swelling improved with diuretics.  A follow up Nuclear stress test demonstrated no significant ischemia.     Mr. [name] returns for follow up. He is here with his wife.  He has had a lot of problems over the past few months.  He has lost ~20 lbs and has not had an appetite. He has had significant muscle weakness and myalgias.  He has been seeing a rheumatologist (Dr. Amil Amen).  He was taken off his Simvastatin 4-5 weeks ago.  This did not help his myalgias.  He apparently had some lab work come back today that negated the need to have a muscle bx.  He has been anemic (Hgb 8.5).  His nephrologist (Dr.Webb) put him on Epogen shots.  He has seen his GI (Dr. Earlean Shawl) and has an EGD pending tomorrow.  He went to the ED 3 days ago with worsening weakness and his creatinine was up (2.44 - up from 1.75).  Hgb was improved at 10.1.  Head CT, Abd CT and CXR did not show anything acute.  Overall, he continues to have dyspnea on exertion with certain activities.  He is able to do other activities without as much shortness of breath.  He has occasional, very brief  lower chest/epigastric pain.  This is not like his prior angina.  He has not had orthopnea, paroxysmal nocturnal dyspnea.  He has not noted much improvement in his leg swelling.  He has not had syncope.    Prior CV studies:   The following studies were reviewed today:  Myoview 10/24/17 EF 56.  No significant reversible ischemia. There is a large size, moderate intensity mostly fixed inferior defect, that mildly improves during upright positioning, suggestive of likely scar with a degree of bowel attenuation. There is also a moderate sized and intensity inferolateral perfusion defect which does change with supine or upright positioning, suggestive of scar. LVEF 56% with mild basal inferior hypokinesis. This is an intermediate risk study given the extent of fixed perfusion defects.  Echo 07/02/17 Mild conc LVH, EF 55-60, no RWMA, Gr 1 DD, mild AS (mean 9), mod AI, mild MAC, mild MR, mod LAE, normal RVSF, mild to mod TR, PASP 39  ABIs 03/10/14 R 1.2, L 1.3  Cardiac Catheterization 03/11/13 Coronary angiography: Coronary dominance: right Left mainstem: The left main arises from the left coronary cusp. There is no significant disease noted. The vessel trifurcates into the LAD, intermediate branch, and left circumflex. Left anterior descending (LAD): The proximal LAD is patent. The first diagonal is patent. The mid LAD has diffuse  50-70% stenosis. The distal vessel fills competitively from the mammary artery. Left circumflex (LCx): The left circumflex system is patent. There is an intermediate branch with mild 30-40% stenosis. The AV circumflex has 50-60% proximal stenosis. The obtuse marginal branches are patent without significant disease. Right coronary artery (RCA): The vessel is moderately calcified. The mid vessel is totally occluded. There is a proximal stent that is widely patent. The vessel is occluded after the stent. The PDA fills from left to right collaterals. Left ventriculography:  Deferred LIMA to LAD: Widely patent with no obstructive disease. There is competitive native vessel and graft flow filling the distal LAD. Saphenous vein graft to PDA: Total occlusion of the proximal aortic anastomosis Saphenous vein graft obtuse marginal: Total occlusion of the proximal aortic anastomosis Final Conclusions:   1. 3 vessel coronary artery disease with total occlusion of the RCA, moderate stenosis of the LAD, and moderate stenosis of the native left circumflex 2. Status post aortocoronary bypass surgery with continued patency of the LIMA to LAD, total occlusion of the vein graft to PDA, and total occlusion of the vein graft obtuse marginal. Recommendations: The patient has moderate nonobstructive disease of the left circumflex. His LAD is grafted with a widely patent LIMA. The circumflex is now chronically occluded with left to right collaterals. With primarily atypical chest pain symptoms, I would favor ongoing medical therapy.  Myoview 02/17/13 Intermediate risk stress nuclear study.  There is a medium size defect of moderate severity and partial reversibility involving the basal and mid-inferior and basal and mid-inferolateral segments. This is new since 2010.  LV Ejection Fraction: 52%.  LV Wall Motion:  There is mild inferior hypokinesis.  Past Medical History:  Diagnosis Date  . Acute superficial venous thrombosis of lower extremity    a. RLE after CABG, neg dopp for DVT.  [name] Allergy   . Atrial fibrillation ([place])    a. Post-op from CABG, on amiodarone temporarily, d/c'd 12/2011  . CAD (coronary artery disease)    a. S/P stenting to mid RCA, prox PDA 06/1999. b. NSTEMI/CABG x 3 in 10/2011 with LIMA to LAD, SVG to PDA, and SVG to OM1.   . Chronic UTI    a. Followed by Dr. Risa Grill - colonized/asymptomatic - not on abx  . CKD (chronic kidney disease), stage III (HCC)    a. stable with a creatinine around 1.9-2.0 followed by nephrology.  . Dyslipidemia   . GERD (gastroesophageal  reflux disease)   . Hypertension    well-controlled.  . Myocardial infarction ([place])   . Neck injury    a. C3-C4 and C4-C5 foraminal narrowing, severe  . Prostate cancer ([place])    a. 2001 s/p TURP.  . Pulmonary nodule    a. felt to be noncancerous.  Status post followup CT scan 4 mm and stable.  . Renal artery stenosis ([place])    a. 50% by cath 2001  . 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   Surgical Hx: The patient  has a past surgical history that includes s/p ptca; cardia catherization (07-07-99); peripheral vascular catherization (11-24-03); renal circulation (10-01-03); stress cardiolite (05-04-05); edg (07-17-1994); Flexible sigmoidoscopy (11-03-1997); Prostatectomy; lumbar spinal disk and neck fusion surgery; stents; Coronary artery bypass graft (10/19/2011); Cardiac surgery (10/18/12); Colonoscopy (04/12/07); Pacemaker insertion (03/28/12); Cataract extraction w/ intraocular lens  implant, bilateral (3/205, 06/2013); left heart catheterization with coronary angiogram (N/A, 10/16/2011); permanent pacemaker insertion (N/A, 03/28/2012); and left heart catheterization with coronary/graft angiogram (N/A,  03/11/2013).   Current Medications: Current Meds  Medication Sig  . aspirin EC 81 MG tablet Take 1 tablet (81 mg total) by mouth daily.  . carboxymethylcellulose (REFRESH PLUS) 0.5 % SOLN Place 1 drop into both eyes daily as needed (dry eyes).  . cimetidine (TAGAMET) 300 MG tablet Take 300 mg by mouth 2 (two) times daily.   . Cyanocobalamin (VITAMIN B-12 IJ) Inject 1 mL as directed every 30 (thirty) days.  . folic acid (FOLVITE) 1 MG tablet TAKE 1 TABLET DAILY  . metoprolol succinate (TOPROL-XL) 25 MG 24 hr tablet Take 1 tablet (25 mg total) by mouth at bedtime.  . Multiple Vitamins-Minerals (PRESERVISION AREDS 2 PO) Take 1 tablet by mouth daily.  . nitroGLYCERIN (NITROSTAT) 0.4 MG SL tablet DISSOLVE 1 TABLET UNDER THE TONGUE EVERY 5 MINUTES AS NEEDED FOR CHEST  PAIN (MAX OF 3 TABLETS)  . simvastatin (ZOCOR) 20 MG tablet TAKE 1 TABLET DAILY (ANNUAL APPOINTMENT DUE IN [place]. MUST SEE DOCTOR FOR REFILLS)  . triamcinolone cream (KENALOG) 0.1 % Apply 1 application topically 2 (two) times daily.  . vitamin C (ASCORBIC ACID) 500 MG tablet Take 1,000 mg by mouth daily.   . [DISCONTINUED] furosemide (LASIX) 40 MG tablet Take 1 tablet (40 mg total) by mouth 3 (three) times a week.     Allergies:   Aspirin; Amoxicillin; Atarax [hydroxyzine hcl]; Cephalexin; Ciprofloxacin; Clindamycin; Clobetasol; Codeine; Fluarix [flu virus vaccine]; Haemophilus influenzae; Hydrocodone; Hydrocodone-acetaminophen; Hydroxyzine; Latex; Lisinopril; Macrobid [nitrofurantoin macrocrystal]; Niacin; Niacin-lovastatin er; Niacin-lovastatin er; Nitrofurantoin; Vibramycin  [doxycycline]; Bactrim [sulfamethoxazole-trimethoprim]; and Colchicine   Social History   Tobacco Use  . Smoking status: Never Smoker  . Smokeless tobacco: Never Used  Substance Use Topics  . Alcohol use: Yes    Comment: Rarely  . Drug use: No     Family Hx: The patient's family history includes Arthritis in his mother; Breast cancer in an other family member; Cancer in his brother; Coronary artery disease in his father; Heart disease in his brother; Nephritis in his brother; Other in his brother and mother; Prostate cancer in his brother. There is no history of Diabetes, Colon cancer, or Adrenal disorder.  ROS:   Please see the history of present illness.    Review of Systems  Constitution: Positive for decreased appetite, malaise/fatigue and weight loss.  Cardiovascular: Positive for dyspnea on exertion and leg swelling.  Skin: Positive for rash.  Musculoskeletal: Positive for back pain, joint pain, joint swelling and myalgias.  Gastrointestinal: Positive for constipation and hematochezia.  Neurological: Positive for dizziness and loss of balance.   All other systems reviewed and are  negative.   EKGs/Labs/Other Test Reviewed:    EKG:  EKG is  ordered today.  The ekg ordered today demonstrates A paced, HR 75, LAD, QTc 455, non-specific ST-TW changes   Recent Labs: 10/07/2017: Pro B Natriuretic peptide (BNP) 439.0 02/02/2018: ALT 15; BUN 44; Creatinine, Ser 2.44; Hemoglobin 10.1; Platelets 401; Potassium 4.6; Sodium 137; TSH 1.827   Recent Lipid Panel Lab Results  Component Value Date/Time   CHOL 122 09/04/2016 11:23 AM   TRIG 59.0 09/04/2016 11:23 AM   TRIG 107 01/16/2006 01:38 PM   HDL 40.40 09/04/2016 11:23 AM   CHOLHDL 3 09/04/2016 11:23 AM   LDLCALC 70 09/04/2016 11:23 AM    Physical Exam:    VS:  BP 126/68   Pulse 83   Ht _0  (1.803 m)   Wt 152 lb 6.4 oz (69.1 kg)   SpO2 98%  BMI 21.26 kg/m     Wt Readings from Last 3 Encounters:  02/05/18 152 lb 6.4 oz (69.1 kg)  02/02/18 153 lb (69.4 kg)  01/31/18 153 lb (69.4 kg)     Physical Exam  Constitutional: He is oriented to person, place, and time. He appears well-developed and well-nourished. No distress.  HENT:  Head: Normocephalic and atraumatic.  Eyes: No scleral icterus.  Neck: No JVD present. No thyromegaly present.  Cardiovascular: Normal rate and regular rhythm.  Murmur heard.  Systolic murmur is present with a grade of 2/6 at the upper right sternal border. Pulmonary/Chest: Effort normal. He has no wheezes. He has no rales.  Abdominal: Soft. He exhibits no distension.  Musculoskeletal:        General: No edema.  Lymphadenopathy:    He has no cervical adenopathy.  Neurological: He is alert and oriented to person, place, and time.  Skin: Skin is warm and dry.  Psychiatric: He has a normal mood and affect.    ASSESSMENT & PLAN:    Chronic diastolic CHF (congestive heart failure) ([place])  Echo in June 2019 with normal EF and mild diastolic dysfunction.  He was started on Lasix when he was last seen in the fall due to volume excess.  He has lost a considerable amount of weight since  that time and is currently being worked up for some type of myositis.  He is also anemic and is pending an EGD with gastroenterology.  He was recently seen in the emergency room with significantly increased creatinine.  His nephrologist just obtained a follow-up BMET today.  I suspect he may not need to take Lasix on a regular basis.  I have recommended that he decrease his Lasix to as needed.  We discussed the importance of daily weights and when to take Lasix.  Fatigue, unspecified type As noted, he has had significant work-up in the past recent months.  He has been seen by rheumatology.  He apparently does not need a muscle biopsy.  He has seen gastroenterology and is undergoing EGD for his anemia.  He has also has been placed on Epogen for his anemia.  As noted, he had a recent nuclear stress test in October 2019 without significant ischemia.  EF was normal on echocardiogram in June 2019 with just mild aortic stenosis.  No further cardiac work-up is needed at this time.  Coronary artery disease involving native coronary artery of native heart with angina pectoris ([place]) History of stenting to the RCA in 2001 and non-STEMI in 2013 treated with CABG.  Recent nuclear stress test without significant ischemia.  He is not having any symptoms suggestive of angina.  Continue aspirin, beta-blocker.  Simvastatin is currently on hold due to his myalgias.  I have instructed him to continue to hold this for now until his myalgias are resolved.  Essential hypertension The patient's blood pressure is controlled on his current regimen.  Continue current therapy.   Moderate aortic regurgitation  Moderate by echocardiogram June 2019.  Consider repeat echo in the next 6 to 12 months.  CKD (chronic kidney disease) stage 3, GFR 30-59 ml/min (HCC)  Recent creatinine elevated.  Nephrology has repeated his BMET today.  Change Lasix to as needed as noted above.  Continue follow-up with nephrology.  Cardiac pacemaker in  situ  Continue follow-up with EP as planned.  Dispo:  Return in about 6 months (around 08/06/2018) for Routine Follow Up, w/ Dr. Burt Knack.   Medication Adjustments/Labs and Tests Ordered:  Current medicines are reviewed at length with the patient today.  Concerns regarding medicines are outlined above.  Tests Ordered: Orders Placed This Encounter  Procedures  . EKG 12-Lead   Medication Changes: Meds ordered this encounter  Medications  . furosemide (LASIX) 40 MG tablet    Sig: Take 1 tablet (40 mg total) by mouth as needed for fluid or edema.    Dispense:  90 tablet    Refill:  3    Signed, Richardson Dopp, PA-C  02/05/2018 5:25 PM    [facility] [address], [place], [place]  [address] Phone: [phone]; Fax: [phone]

## 2018-02-05 NOTE — Patient Instructions (Signed)
Medication Instructions:  Your physician has recommended you make the following change in your medication:  1. CHANGE LASIX TO AS NEEDED. Please make sure to weigh daily and if your weight increases 3 lbs or more in 1 day call our office to let us know [phone].  If you need a refill on your cardiac medications before your next appointment, please call your pharmacy.   Lab work: NONE  If you have labs (blood work) drawn today and your tests are completely normal, you will receive your results only by: [name] MyChart Message (if you have MyChart) OR . A paper copy in the mail If you have any lab test that is abnormal or we need to change your treatment, we will call you to review the results.  Testing/Procedures: NONE  Follow-Up: At [facility], you and your health needs are our priority.  As part of our continuing mission to provide you with exceptional heart care, we have created designated Provider Care Teams.  These Care Teams include your primary Cardiologist (physician) and Advanced Practice Providers (APPs -  Physician Assistants and Nurse Practitioners) who all work together to provide you with the care you need, when you need it. . You will need a follow up appointment in:  6 months.  Please call our office 2 months in advance to schedule this appointment.  You may see [name], MD or one of the following Advanced Practice Providers on your designated Care Team: . Richardson Dopp, PA-C . Vin Bhagat, PA-C . Daune Perch, NP  Any Other Special Instructions Will Be Listed Below (If Applicable). RESTART SIMVASTATIN WHEN OKAY WITH DR. Tomasa Blase

## 2018-02-06 ENCOUNTER — Telehealth: Payer: Self-pay | Admitting: Cardiovascular Disease

## 2018-02-06 NOTE — Telephone Encounter (Signed)
° ° ° °  Pt c/o medication issue:  1. Name of Medication: aspirin EC 81 MG tablet 2. How are you currently taking this medication (dosage and times per day)? n/a  3. Are you having a reaction (difficulty breathing--STAT)? no  4. What is your medication issue? Patient calling to report Dr Earlean Shawl would like him to discontinue aspirin EC 81 MG tablet for 30 days. Patient would like to discuss

## 2018-02-06 NOTE — Telephone Encounter (Signed)
Noted for Richardson Dopp, [place]

## 2018-02-06 NOTE — Telephone Encounter (Signed)
Pt saw Brynda Rim. PA yesterday. PA is not in the office today. I will route to Triage and to Richardson Dopp, PA/Dr. Burt Knack

## 2018-02-06 NOTE — Telephone Encounter (Signed)
Spoke to the patient and found out that this morning, after his Endoscopy,  Dr Earlean Shawl while showing the patient results (photos), asked the patient to stop Aspirin for 30 days, patient not sure why?  Dr Earlean Shawl said that he was sending Dr Burt Knack a report from the exam.  Please advise, thank you.

## 2018-02-06 NOTE — Telephone Encounter (Signed)
This is fine. Ok to hold x 30 days as recommended.

## 2018-02-06 NOTE — Telephone Encounter (Signed)
Informed patient he may hold ASA for 30 days. He was grateful for call.

## 2018-02-07 ENCOUNTER — Encounter (HOSPITAL_COMMUNITY): Payer: Medicare Other

## 2018-02-09 NOTE — Progress Notes (Signed)
Subjective:    Patient ID: [name], male    DOB: 12-Jun-1926, 85 y.o.   MRN: [id number]  HPI The patient is here for follow up.  He went to the ED on 02/02/18 for generalized weakness and increased unsteadiness.    He felt like he was going to pass out.  CXR with no acute illness.  Ct abdomen/pelvis  and Ct of head was done - both without acute abnormality.  Cr up a little compared to baseline and he was given a fluid bolus.  Anemia was slightly better.  He was discharged home.    Spontaneous movement of left lower arm. Last week he was going to wash up and put down the washcloth.  His left arm suddenly felt like rubber and was moving around and he was not able to control it. It was from the left elbow down.  It lasted at least one min.  Since then he has had occasional jumping in the hand.  He denies previous episodes.  UGIB:  He did have the EGD last week and has moderate gastritis.  He was taken off the ASA and started on omeprazole.    His last several stools have been normal in color - no longer black.   Chronic kidney disease, lower extremity edema: He recently saw his kidney doctor and he was advised to take his Lasix only if needed, not on a regular basis.  He has been weighing himself daily at home.   Sunday morning 154, Sunday pm 157.5, this morning he did not weigh himself.     He did follow-up with Dr. Amil Amen and had more blood work done.  He most likely will be getting a call regarding the blood work today.  He was advised to continue to not take the statin.  He continues to have pain in his muscles and joints.  He is also following with orthopedics for chronic back issues.  Gout in toe: He does have a follow-up with Dr. Tamala Julian regarding his gout.  He states the pain is better, but he still has some pain in the left toe.  He denies any pain at rest, but sometimes when he walks he will have some pain.    Medications and allergies reviewed with patient and updated if  appropriate.  Patient Active Problem List   Diagnosis Date Noted  . Chronic diastolic CHF (congestive heart failure) ([place]) 02/05/2018  . Pain and swelling of toe of right foot 02/01/2018  . UGIB (upper gastrointestinal bleed) 02/01/2018  . Hyperpigmentation 11/04/2017  . Abnormal appearance of skin 10/07/2017  . Frequent urination 09/03/2017  . Gouty arthritis of right great toe 09/03/2017  . Postnasal drip 08/06/2017  . Rash and nonspecific skin eruption 08/06/2017  . Moderate aortic regurgitation 07/04/2017  . Diastolic dysfunction 07/86/7544  . Edema 06/29/2017  . DOE (dyspnea on exertion) 06/29/2017  . Sinus symptom 06/05/2017  . Fatigue 05/18/2017  . Sleeping difficulty 05/18/2017  . Foot pain, bilateral 04/22/2017  . Trochanteric bursitis 01/10/2017  . Internal hemorrhoids 08/15/2016  . Loose stools 02/06/2016  . Dysphagia 02/06/2016  . Cephalalgia 07/14/2015  . Constipation 03/01/2015  . Myalgia 03/02/2014  . CHB (complete heart block) ([place]) 05/03/2012  . Postoperative atrial fibrillation ([place]) 05/03/2012  . Pacemaker 04/10/2012  . 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 Coronary atherosclerosis 11/27/2011  . Presence of aortocoronary bypass graft 10/24/2011  . Pulmonary nodule   . CAD (coronary artery disease)   .  CKD (chronic kidney disease) stage 3, GFR 30-59 ml/min (HCC)   . Venous insufficiency of leg 06/05/2010  . SHOULDER PAIN, RIGHT, CHRONIC 10/14/2009  . B12 deficiency 08/23/2009  . Peripheral neuropathy 03/21/2009  . DEGENERATIVE DISC DISEASE, CERVICAL SPINE, W/RADICULOPATHY 09/21/2008  . Dyslipidemia 01/17/2007  . Essential hypertension 07/20/2006  . Prostate cancer ([place]) 07/20/2006    Current Outpatient Medications on File Prior to Visit  Medication Sig Dispense Refill  . carboxymethylcellulose (REFRESH PLUS) 0.5 % SOLN Place 1 drop into both eyes daily as needed (dry eyes). 30 mL 1  . Cyanocobalamin (VITAMIN B-12 IJ) Inject 1  mL as directed every 30 (thirty) days.    . folic acid (FOLVITE) 1 MG tablet TAKE 1 TABLET DAILY 90 tablet 3  . furosemide (LASIX) 40 MG tablet Take 1 tablet (40 mg total) by mouth as needed for fluid or edema. 90 tablet 3  . metoprolol succinate (TOPROL-XL) 25 MG 24 hr tablet Take 1 tablet (25 mg total) by mouth at bedtime. 90 tablet 3  . Multiple Vitamins-Minerals (PRESERVISION AREDS 2 PO) Take 1 tablet by mouth daily.    . nitroGLYCERIN (NITROSTAT) 0.4 MG SL tablet DISSOLVE 1 TABLET UNDER THE TONGUE EVERY 5 MINUTES AS NEEDED FOR CHEST PAIN (MAX OF 3 TABLETS) 100 tablet 4  . omeprazole (PRILOSEC) 40 MG capsule Take 40 mg by mouth daily. Before breakfast    . triamcinolone cream (KENALOG) 0.1 % Apply 1 application topically 2 (two) times daily. 30 g 0  . vitamin C (ASCORBIC ACID) 500 MG tablet Take 1,000 mg by mouth daily.     . simvastatin (ZOCOR) 20 MG tablet TAKE 1 TABLET DAILY (ANNUAL APPOINTMENT DUE IN [place]. MUST SEE DOCTOR FOR REFILLS) (Patient not taking: Reported on 02/10/2018) 90 tablet 1   No current facility-administered medications on file prior to visit.     Past Medical History:  Diagnosis Date  . Acute superficial venous thrombosis of lower extremity    a. RLE after CABG, neg dopp for DVT.  [name] Allergy   . Atrial fibrillation ([place])    a. Post-op from CABG, on amiodarone temporarily, d/c'd 12/2011  . CAD (coronary artery disease)    a. S/P stenting to mid RCA, prox PDA 06/1999. b. NSTEMI/CABG x 3 in 10/2011 with LIMA to LAD, SVG to PDA, and SVG to OM1.   . Chronic UTI    a. Followed by Dr. Risa Grill - colonized/asymptomatic - not on abx  . CKD (chronic kidney disease), stage III (HCC)    a. stable with a creatinine around 1.9-2.0 followed by nephrology.  . Dyslipidemia   . GERD (gastroesophageal reflux disease)   . Hypertension    well-controlled.  . Myocardial infarction ([place])   . Neck injury    a. C3-C4 and C4-C5 foraminal narrowing, severe  . Prostate cancer ([place])    a.  2001 s/p TURP.  . Pulmonary nodule    a. felt to be noncancerous.  Status post followup CT scan 4 mm and stable.  . Renal artery stenosis ([place])    a. 50% by cath 2001  . 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 cardia catherization  07-07-99  . CARDIAC SURGERY  10/18/12   open heart surgery  . CATARACT EXTRACTION W/ INTRAOCULAR LENS  IMPLANT, BILATERAL  3/205, 06/2013   [facility]uen  . COLONOSCOPY  04/12/07  . CORONARY ARTERY BYPASS GRAFT  10/19/2011   Procedure: CORONARY ARTERY BYPASS GRAFTING (CABG);  Surgeon: Gaye Pollack, MD;  Location: [facility];  Service: Open Heart Surgery;  Laterality: N/A;  times three using Left Internal Mammary Artery and Right Greater Saphenouse Vein Graft harvested Endoscopically  . edg  07-17-1994  . FLEXIBLE SIGMOIDOSCOPY  11-03-1997  . LEFT HEART CATHETERIZATION WITH CORONARY ANGIOGRAM N/A 10/16/2011   Procedure: LEFT HEART CATHETERIZATION WITH CORONARY ANGIOGRAM;  Surgeon: Peter M Martinique, MD;  Location: [facility] CATH LAB;  Service: Cardiovascular;  Laterality: N/A;  . LEFT HEART CATHETERIZATION WITH CORONARY/GRAFT ANGIOGRAM N/A 03/11/2013   Procedure: LEFT HEART CATHETERIZATION WITH [name];  Surgeon: Blane Ohara, MD;  Location: [facility] CATH LAB;  Service: Cardiovascular;  Laterality: N/A;  . lumbar spinal disk and neck fusion surgery    . PACEMAKER INSERTION  03/28/12   PPM implanted for mobitz II AV block  . peripheral vascular catherization  11-24-03  . PERMANENT PACEMAKER INSERTION N/A 03/28/2012   Procedure: PERMANENT PACEMAKER INSERTION;  Surgeon: Thompson Grayer, MD;  [facility];  Service: Cardiovascular;  Laterality: N/A;  . PROSTATECTOMY    . renal circulation  10-01-03  . s/p ptca    . stents     X 2  . stress cardiolite  05-04-05   spring 09-negative except for apical thinning, EF 68%    Social History   Socioeconomic History  . Marital  status: Married    Spouse name: [name]  . Number of children: 2  . Years of education: Not on file  . Highest education level: Not on file  Occupational History  . 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 Financial resource strain: Not on file  . Food insecurity:    Worry: Not on file    Inability: Not on file  . Transportation needs:    Medical: Not on file    Non-medical: Not on file  Tobacco Use  . Smoking status: Never Smoker  . Smokeless tobacco: Never Used  Substance and Sexual Activity  . Alcohol use: Yes    Comment: Rarely  . Drug use: No  . Sexual activity: Not on file  Lifestyle  . Physical activity:    Days per week: Not on file    Minutes per session: Not on file  . Stress: Not on file  Relationships  . Social connections:    Talks on phone: Not on file    Gets together: Not on file    Attends religious service: Not on file    Active member of club or organization: Not on file    Attends meetings of clubs or organizations: Not on file    Relationship status: Not on file  Other Topics Concern  . Not on file  Social History Narrative   HSG, 1 year college.  married '52 - 3 years, divorced; married '56 - 3 years divorced; married '42-12 yrs - divorced; married '75 -. 1 son '57; 1 daughter - '53; 1 grandchild.  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  Very happily married.  End of life care: yes CPR, no long term mechanical ventilation, no heroic measures.    Family History  Problem Relation Age of Onset  . Coronary artery disease Father  died @ 24  . Other Mother        cerebral hemorrhage - died @ 54  . Arthritis Mother   . Cancer Brother        Bladder  . Prostate cancer Brother   . Nephritis Brother        died @ age 81.  . Other Brother         cerebral hemorrhage - died @ 45  . Heart disease Brother   . Breast cancer Other        niece x 2  . Diabetes Neg Hx   . Colon cancer Neg Hx   . Adrenal disorder Neg Hx     Review of Systems  Constitutional: Positive for chills. Negative for fever.  Respiratory: Positive for shortness of breath (with exertion). Negative for cough and wheezing.   Cardiovascular: Positive for leg swelling. Negative for chest pain and palpitations.  Gastrointestinal: Negative for abdominal pain and blood in stool.  Musculoskeletal: Positive for arthralgias and back pain.  Neurological: Positive for dizziness and light-headedness. Negative for headaches.       Objective:   Vitals:   02/10/18 0956  BP: (!) 174/86  Pulse: 64  Resp: 14  Temp: 98 F (36.7 C)  SpO2: 99%   BP Readings from Last 3 Encounters:  02/10/18 (!) 174/86  02/05/18 126/68  02/02/18 (!) 152/63   Wt Readings from Last 3 Encounters:  02/10/18 157 lb (71.2 kg)  02/05/18 152 lb 6.4 oz (69.1 kg)  02/02/18 153 lb (69.4 kg)   Body mass index is 21.9 kg/m.   Physical Exam    Constitutional: Appears well-developed and well-nourished. No distress.  HENT:  Head: Normocephalic and atraumatic.  Neck: Neck supple. No tracheal deviation present. No thyromegaly present.  No cervical lymphadenopathy Cardiovascular: Normal rate, regular rhythm and normal heart sounds.  No murmur heard. No carotid bruit .  Trace bilateral lower extremity edema Pulmonary/Chest: Effort normal and breath sounds normal. No respiratory distress. No has no wheezes. No rales.  Abdomen: soft, NT, ND Skin: Skin is warm and dry. Not diaphoretic.  Psychiatric: Normal mood and affect. Behavior is normal.      Assessment & Plan:    See Problem List for Assessment and Plan of chronic medical problems.

## 2018-02-10 ENCOUNTER — Ambulatory Visit (INDEPENDENT_AMBULATORY_CARE_PROVIDER_SITE_OTHER): Payer: Medicare Other | Admitting: Internal Medicine

## 2018-02-10 ENCOUNTER — Encounter (HOSPITAL_COMMUNITY)
Admission: RE | Admit: 2018-02-10 | Discharge: 2018-02-10 | Disposition: A | Discharge status: Home / Self Care | Payer: Medicare Other | Source: Ambulatory Visit | Attending: Nephrology | Admitting: Nephrology

## 2018-02-10 ENCOUNTER — Encounter: Payer: Self-pay | Admitting: Internal Medicine

## 2018-02-10 VITALS — BP 174/86 | HR 64 | Temp 98.0°F | Resp 14 | Ht 71.0 in | Wt 157.0 lb

## 2018-02-10 VITALS — BP 157/70 | HR 66 | Resp 18

## 2018-02-10 DIAGNOSIS — I25119 Atherosclerotic heart disease of native coronary artery with unspecified angina pectoris: Secondary | ICD-10-CM | POA: Diagnosis not present

## 2018-02-10 DIAGNOSIS — N183 Chronic kidney disease, stage 3 unspecified: Secondary | ICD-10-CM

## 2018-02-10 DIAGNOSIS — G253 Myoclonus: Secondary | ICD-10-CM

## 2018-02-10 DIAGNOSIS — K922 Gastrointestinal hemorrhage, unspecified: Secondary | ICD-10-CM

## 2018-02-10 DIAGNOSIS — M791 Myalgia, unspecified site: Secondary | ICD-10-CM | POA: Diagnosis not present

## 2018-02-10 DIAGNOSIS — I10 Essential (primary) hypertension: Secondary | ICD-10-CM | POA: Diagnosis not present

## 2018-02-10 LAB — POCT HEMOGLOBIN-HEMACUE: Hemoglobin: 8.6 g/dL — ABNORMAL LOW (ref 13.0–17.0)

## 2018-02-10 MED ORDER — EPOETIN ALFA-EPBX 2000 UNIT/ML IJ SOLN
2000.0000 [IU] | INTRAMUSCULAR | Status: DC
  Administered 2018-02-10: 2000 [IU] via SUBCUTANEOUS
  Filled 2018-02-10: qty 1

## 2018-02-10 MED ORDER — EPOETIN ALFA-EPBX 10000 UNIT/ML IJ SOLN
5000.0000 [IU] | INTRAMUSCULAR | Status: DC
  Filled 2018-02-10: qty 1

## 2018-02-10 MED ORDER — EPOETIN ALFA-EPBX 3000 UNIT/ML IJ SOLN
3000.0000 [IU] | INTRAMUSCULAR | Status: DC
  Administered 2018-02-10: 3000 [IU] via SUBCUTANEOUS
  Filled 2018-02-10: qty 1

## 2018-02-10 NOTE — Assessment & Plan Note (Signed)
Last week he had an episode of left lower movement that he did not have control.  That lasted just over 1 minute Has had occasional hand jerking since then ?  Neurological cause We will refer to neurology and he will monitor

## 2018-02-10 NOTE — Patient Instructions (Addendum)
   Medications reviewed and updated.  Changes include :    none   A referral was ordered for Neurology   Please followup in 3 months

## 2018-02-10 NOTE — Assessment & Plan Note (Addendum)
ASA stopped by GI for one month Taking omeprazole 40 mg daily Denies black stools and stools are returning to normal

## 2018-02-10 NOTE — Assessment & Plan Note (Signed)
He denies improvement since stopping the statin Had repeat blood work last week with Dr. Sula Rumple will likely get the results today or tomorrow Continue to hold the statin

## 2018-02-10 NOTE — Assessment & Plan Note (Signed)
Blood pressure elevated here today and somewhat labile Continue to monitor with current medications

## 2018-02-13 ENCOUNTER — Telehealth: Payer: Self-pay | Admitting: Cardiovascular Disease

## 2018-02-13 ENCOUNTER — Ambulatory Visit (INDEPENDENT_AMBULATORY_CARE_PROVIDER_SITE_OTHER): Payer: Medicare Other | Admitting: Family Medicine

## 2018-02-13 ENCOUNTER — Encounter: Payer: Self-pay | Admitting: Family Medicine

## 2018-02-13 ENCOUNTER — Ambulatory Visit: Payer: Medicare Other | Admitting: Internal Medicine

## 2018-02-13 VITALS — BP 140/84 | HR 77 | Ht 71.0 in | Wt 158.0 lb

## 2018-02-13 DIAGNOSIS — I25119 Atherosclerotic heart disease of native coronary artery with unspecified angina pectoris: Secondary | ICD-10-CM

## 2018-02-13 DIAGNOSIS — M79671 Pain in right foot: Secondary | ICD-10-CM | POA: Diagnosis not present

## 2018-02-13 DIAGNOSIS — M79672 Pain in left foot: Secondary | ICD-10-CM

## 2018-02-13 DIAGNOSIS — M109 Gout, unspecified: Secondary | ICD-10-CM | POA: Diagnosis not present

## 2018-02-13 MED ORDER — TRIAMCINOLONE ACETONIDE 0.1 % EX CREA: 1.0000 "application " | TOPICAL_CREAM | Freq: Two times a day (BID) | CUTANEOUS | 0 refills | Status: DC

## 2018-02-13 NOTE — Assessment & Plan Note (Signed)
Redness noted is significantly improved.  Believe that the gout flare is somewhat improved.  Will be difficult to manage secondary to patient's kidney issues.

## 2018-02-13 NOTE — Telephone Encounter (Signed)
I will route to Jacinta Shoe, [place] for [employer], [place] has pt has recently seen PA, not sure if she may have called the pt, though I do not see any notes. Looks like referral to Neuro is from pt's PCP Dr. Celso Amy.

## 2018-02-13 NOTE — Progress Notes (Signed)
[name] [address] [place], [place] [address] Phone: [phone] Subjective:   Fontaine No, am serving as a scribe for Dr. Hulan Saas.   CC: Maternal pain follow-up  [id number]    01/31/2018:  No sign of infectious etiology.  Steroid injection given today.  Discussed icing regimen and home exercise.  Discussed topical anti-inflammatories.  Patient will increase activity slowly over the course of next several days.  Discussed proper shoe wear.  Topical anti-inflammatories given.  Due to patient's chronic kidney disease would consider uloric if needed but we will monitor.  Follow-up again in 2 weeks  Update 02/13/2018:  SYLUS STGERMAIN is a 85 y.o. male coming for follow up for left foot pain. Patient states that he had a streak of the color purple over his right bunion that appeared following the injection. Still feels that the area is sensitive with walking and if he hits his foot against something.  Patient states that actually the redness might be a little better overall.  Since we have seen patient though has been in the emergency room and did have more renal insufficiency.  Has had to discontinue certain medications.  Does not know if that is making a difference or not.      Past Medical History:  Diagnosis Date  . Acute superficial venous thrombosis of lower extremity    a. RLE after CABG, neg dopp for DVT.  [name] Allergy   . Atrial fibrillation ([place])    a. Post-op from CABG, on amiodarone temporarily, d/c'd 12/2011  . CAD (coronary artery disease)    a. S/P stenting to mid RCA, prox PDA 06/1999. b. NSTEMI/CABG x 3 in 10/2011 with LIMA to LAD, SVG to PDA, and SVG to OM1.   . Chronic UTI    a. Followed by Dr. Risa Grill - colonized/asymptomatic - not on abx  . CKD (chronic kidney disease), stage III (HCC)    a. stable with a creatinine around 1.9-2.0 followed by nephrology.  . Dyslipidemia   . GERD (gastroesophageal reflux disease)   .  Hypertension    well-controlled.  . Myocardial infarction ([place])   . Neck injury    a. C3-C4 and C4-C5 foraminal narrowing, severe  . Prostate cancer ([place])    a. 2001 s/p TURP.  . Pulmonary nodule    a. felt to be noncancerous.  Status post followup CT scan 4 mm and stable.  . Renal artery stenosis ([place])    a. 50% by cath 2001  . 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 cardia catherization  07-07-99  . CARDIAC SURGERY  10/18/12   open heart surgery  . CATARACT EXTRACTION W/ INTRAOCULAR LENS  IMPLANT, BILATERAL  3/205, 06/2013   [facility]  . COLONOSCOPY  04/12/07  . CORONARY ARTERY BYPASS GRAFT  10/19/2011   Procedure: CORONARY ARTERY BYPASS GRAFTING (CABG);  Surgeon: Gaye Pollack, MD;  Location: [facility];  Service: Open Heart Surgery;  Laterality: N/A;  times three using Left Internal Mammary Artery and Right Greater Saphenouse Vein Graft harvested Endoscopically  . edg  07-17-1994  . FLEXIBLE SIGMOIDOSCOPY  11-03-1997  . LEFT HEART CATHETERIZATION WITH CORONARY ANGIOGRAM N/A 10/16/2011   Procedure: LEFT HEART CATHETERIZATION WITH CORONARY ANGIOGRAM;  Surgeon: Peter M Martinique, MD;  Location: [facility] CATH LAB;  Service: Cardiovascular;  Laterality: N/A;  . LEFT HEART CATHETERIZATION WITH CORONARY/GRAFT ANGIOGRAM N/A  03/11/2013   Procedure: LEFT HEART CATHETERIZATION WITH [name];  Surgeon: Blane Ohara, MD;  Location: [facility] CATH LAB;  Service: Cardiovascular;  Laterality: N/A;  . lumbar spinal disk and neck fusion surgery    . PACEMAKER INSERTION  03/28/12   PPM implanted for mobitz II AV block  . peripheral vascular catherization  11-24-03  . PERMANENT PACEMAKER INSERTION N/A 03/28/2012   Procedure: PERMANENT PACEMAKER INSERTION;  Surgeon: Thompson Grayer, MD;  Location: [facility] CATH LAB;  Service: Cardiovascular;  Laterality: N/A;  . PROSTATECTOMY    . renal circulation  10-01-03  . s/p ptca    .  stents     X 2  . stress cardiolite  05-04-05   spring 09-negative except for apical thinning, EF 68%   Social History   Socioeconomic History  . Marital status: Married    Spouse name: [name]  . Number of children: 2  . Years of education: Not on file  . Highest education level: Not on file  Occupational History  . 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 Financial resource strain: Not on file  . Food insecurity:    Worry: Not on file    Inability: Not on file  . Transportation needs:    Medical: Not on file    Non-medical: Not on file  Tobacco Use  . Smoking status: Never Smoker  . Smokeless tobacco: Never Used  Substance and Sexual Activity  . Alcohol use: Yes    Comment: Rarely  . Drug use: No  . Sexual activity: Not on file  Lifestyle  . Physical activity:    Days per week: Not on file    Minutes per session: Not on file  . Stress: Not on file  Relationships  . Social connections:    Talks on phone: Not on file    Gets together: Not on file    Attends religious service: Not on file    Active member of club or organization: Not on file    Attends meetings of clubs or organizations: Not on file    Relationship status: Not on file  Other Topics Concern  . Not on file  Social History Narrative   HSG, 1 year college.  married '52 - 3 years, divorced; married '56 - 3 years divorced; married '76-12 yrs - divorced; married '75 -. 1 son '57; 1 daughter - '53; 1 grandchild.  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  Very happily married.  End of life care: yes CPR, no long term mechanical ventilation, no heroic measures.   Allergies  Allergen Reactions  . Aspirin Nausea And Vomiting and Other (See Comments)    REACTION: upset stomach; tolerates coated aspirin REACTION:  upset stomach; tolerates coated aspirin On hold  . Amoxicillin Nausea Only  . Atarax [Hydroxyzine Hcl] Other (See Comments)    REACTION: unknown  . Cephalexin Diarrhea  . Ciprofloxacin Nausea Only  . Clindamycin   . Clobetasol     Non effective  . Codeine     REACTION: Stomach upset  . Fluarix [Flu Virus Vaccine] Itching  . Haemophilus Influenzae Itching  . Hydrocodone Nausea Only  . Hydrocodone-Acetaminophen Nausea And Vomiting  . Hydroxyzine Nausea And Vomiting  . Latex Itching    After flu shot  .  Lisinopril     After 3 tablets, he experienced trouble swallowing, throat tightness and hoarseness.   Santiago Bur [Nitrofurantoin Macrocrystal] Nausea Only  . Niacin Other (See Comments)    REACTION: unknown  . Niacin-Lovastatin Er     Unsure of reaction. Taking simvastatin at home without problems  . Niacin-Lovastatin Er   . Nitrofurantoin Other (See Comments)    REACTION: upset stomach REACTION: upset stomach  . Vibramycin  [Doxycycline]   . Bactrim [Sulfamethoxazole-Trimethoprim] Rash  . Colchicine Rash   Family History  Problem Relation Age of Onset  . Coronary artery disease Father        died @ 12  . Other Mother        cerebral hemorrhage - died @ 3  . Arthritis Mother   . Cancer Brother        Bladder  . Prostate cancer Brother   . Nephritis Brother        died @ age 85.  . Other Brother        cerebral hemorrhage - died @ 63  . Heart disease Brother   . Breast cancer Other        niece x 2  . Diabetes Neg Hx   . Colon cancer Neg Hx   . Adrenal disorder Neg Hx      Current Outpatient Medications (Cardiovascular):  .  furosemide (LASIX) 40 MG tablet, Take 1 tablet (40 mg total) by mouth as needed for fluid or edema. .  metoprolol succinate (TOPROL-XL) 25 MG 24 hr tablet, Take 1 tablet (25 mg total) by mouth at bedtime. .  nitroGLYCERIN (NITROSTAT) 0.4 MG SL tablet, DISSOLVE 1 TABLET UNDER THE TONGUE EVERY 5 MINUTES AS NEEDED FOR CHEST PAIN (MAX OF 3  TABLETS) .  simvastatin (ZOCOR) 20 MG tablet, TAKE 1 TABLET DAILY (ANNUAL APPOINTMENT DUE IN [place]. MUST SEE DOCTOR FOR REFILLS)    Current Outpatient Medications (Hematological):  [name]  Cyanocobalamin (VITAMIN B-12 IJ), Inject 1 mL as directed every 30 (thirty) days. .  folic acid (FOLVITE) 1 MG tablet, TAKE 1 TABLET DAILY  Current Outpatient Medications (Other):  .  carboxymethylcellulose (REFRESH PLUS) 0.5 % SOLN, Place 1 drop into both eyes daily as needed (dry eyes). .  Multiple Vitamins-Minerals (PRESERVISION AREDS 2 PO), Take 1 tablet by mouth daily. [name]  omeprazole (PRILOSEC) 40 MG capsule, Take 40 mg by mouth daily. Before breakfast .  triamcinolone cream (KENALOG) 0.1 %, Apply 1 application topically 2 (two) times daily. .  vitamin C (ASCORBIC ACID) 500 MG tablet, Take 1,000 mg by mouth daily.     Past medical history, social, surgical and family history all reviewed in electronic medical record.  No pertanent information unless stated regarding to the chief complaint.   Review of Systems:  No headache, visual changes, nausea, vomiting, diarrhea, constipation, dizziness, abdominal pain, skin rash, fevers, chills, night sweats, weight loss, swollen lymph nodes, body aches, joint swelling,  chest pain, shortness of breath, mood changes.  Positive muscle aches  Objective  Blood pressure 140/84, pulse 77, height 5\' 11"  (1.803 m), weight 158 lb (71.7 kg), SpO2 99 %.    General: No apparent distress alert and oriented x3 mood and affect normal, dressed appropriately.  HEENT: Pupils equal, extraocular movements intact  Respiratory: Patient's speak in full sentences and does not appear short of breath  Cardiovascular: 2+ lower extremity edema, non tender, no erythema  Skin: Warm dry intact with no signs of infection or rash on extremities or  on axial skeleton.  Abdomen: Soft nontender  Neuro: Cranial nerves II through XII are intact, neurovascularly intact in all extremities with 2+  DTRs and 2+ pulses.  Lymph: No lymphadenopathy of posterior or anterior cervical chain or axillae bilaterally.  Gait antalgic MSK: Exam shows the patient's erythema around the first toe on the right side is actually significantly improved.  Patient does have a bruise from the previous injection.  Patient does have arthritic changes.  Patient is more painful over the medial aspect of his bunion.  Does have some mild skin breakdown in the area.  Mild peripheral neuropathy noted on exam.     Impression and Recommendations:     This case required medical decision making of moderate complexity. The above documentation has been reviewed and is accurate and complete Lyndal Pulley, DO       Note: This dictation was prepared with Dragon dictation along with smaller phrase technology. Any transcriptional errors that result from this process are unintentional.

## 2018-02-13 NOTE — Assessment & Plan Note (Signed)
Concern for some of the foot pain to be more likely secondary to a peripheral neuropathy.  We will get ABI to make sure that this is not a vascular aspect that is contributing to some of it as well.  Patient pain seems to be worsening overall.  Has had many other comorbidities and other health issues that are more pressing at the moment though.  We discussed proper shoes, different lacing, and we could consider such medications as gabapentin but will hold at this time until patient's other health issues seem to resolve somewhat.  Patient will be following up with me though again in 2 to 3 weeks.

## 2018-02-13 NOTE — Telephone Encounter (Signed)
New message     Pt stated that he got an call . He mentioned Referral to [facility]. I advised that they would call him to get scheduled and pt is not sure reason for call.

## 2018-02-13 NOTE — Patient Instructions (Addendum)
Good to see you  Wear a band aid daily  Triamcinolone cream daily onto the skin then cover with a band aide.  Rigid sole shoe and do not lace the last eye  They will call you on the other test for blood flow  Have an appointment with me in 3 weeks

## 2018-02-14 ENCOUNTER — Ambulatory Visit: Payer: Medicare Other | Admitting: Internal Medicine

## 2018-02-14 NOTE — Telephone Encounter (Signed)
Made pt aware that the referral came from Dr. Billey Gosling office and he would need to call their office. Pt verbalized understanding and thanked me for the call.

## 2018-02-14 NOTE — Telephone Encounter (Signed)
Patient returning call.

## 2018-02-14 NOTE — Telephone Encounter (Signed)
LMOM for pt to call our office back about referral to Neurology.

## 2018-02-17 ENCOUNTER — Ambulatory Visit (HOSPITAL_COMMUNITY)
Admission: RE | Admit: 2018-02-17 | Discharge: 2018-02-17 | Disposition: A | Discharge status: Home / Self Care | Payer: Medicare Other | Source: Ambulatory Visit | Attending: Nephrology | Admitting: Nephrology

## 2018-02-17 VITALS — BP 165/69 | HR 69

## 2018-02-17 DIAGNOSIS — N183 Chronic kidney disease, stage 3 unspecified: Secondary | ICD-10-CM

## 2018-02-17 LAB — IRON AND TIBC
IRON: 10 ug/dL — AB (ref 45–182)
Saturation Ratios: 3 % — ABNORMAL LOW (ref 17.9–39.5)
TIBC: 332 ug/dL (ref 250–450)
UIBC: 322 ug/dL

## 2018-02-17 LAB — FERRITIN: Ferritin: 13 ng/mL — ABNORMAL LOW (ref 24–336)

## 2018-02-17 MED ORDER — EPOETIN ALFA-EPBX 10000 UNIT/ML IJ SOLN
10000.0000 [IU] | Freq: Once | INTRAMUSCULAR | Status: AC
  Administered 2018-02-17: 10000 [IU] via SUBCUTANEOUS
  Filled 2018-02-17: qty 1

## 2018-02-17 MED ORDER — EPOETIN ALFA-EPBX 3000 UNIT/ML IJ SOLN
3000.0000 [IU] | Freq: Once | INTRAMUSCULAR | Status: DC
  Filled 2018-02-17: qty 1

## 2018-02-17 MED ORDER — EPOETIN ALFA-EPBX 10000 UNIT/ML IJ SOLN: 5000.0000 [IU] | Freq: Once | INTRAMUSCULAR | Status: DC

## 2018-02-17 MED ORDER — EPOETIN ALFA-EPBX 2000 UNIT/ML IJ SOLN
2000.0000 [IU] | Freq: Once | INTRAMUSCULAR | Status: DC
  Filled 2018-02-17: qty 1

## 2018-02-17 MED ORDER — EPOETIN ALFA-EPBX 10000 UNIT/ML IJ SOLN: 5000.0000 [IU] | INTRAMUSCULAR | Status: DC

## 2018-02-17 NOTE — Progress Notes (Signed)
HGB 7.9 via hemocue today.  This has decreased with the last 2 visits.  Dr Justin Mend notified.  New orders received for today. Pt made aware of change and instructed to call for any bleeding, shortness of breath or chest pain.

## 2018-02-18 LAB — POCT HEMOGLOBIN-HEMACUE: Hemoglobin: 7.9 g/dL — ABNORMAL LOW (ref 13.0–17.0)

## 2018-02-19 ENCOUNTER — Other Ambulatory Visit (HOSPITAL_COMMUNITY): Payer: Self-pay | Admitting: Family Medicine

## 2018-02-19 DIAGNOSIS — M79605 Pain in left leg: Secondary | ICD-10-CM

## 2018-02-21 ENCOUNTER — Other Ambulatory Visit (HOSPITAL_COMMUNITY): Payer: Self-pay | Admitting: *Deleted

## 2018-02-21 ENCOUNTER — Encounter (HOSPITAL_COMMUNITY): Payer: Medicare Other

## 2018-02-24 ENCOUNTER — Ambulatory Visit (HOSPITAL_COMMUNITY)
Admission: RE | Admit: 2018-02-24 | Discharge: 2018-02-24 | Disposition: A | Discharge status: Home / Self Care | Payer: Medicare Other | Source: Ambulatory Visit | Attending: Nephrology | Admitting: Nephrology

## 2018-02-24 VITALS — BP 164/64 | HR 64 | Temp 98.0°F | Resp 18

## 2018-02-24 DIAGNOSIS — N183 Chronic kidney disease, stage 3 unspecified: Secondary | ICD-10-CM

## 2018-02-24 LAB — POCT HEMOGLOBIN-HEMACUE: Hemoglobin: 8.3 g/dL — ABNORMAL LOW (ref 13.0–17.0)

## 2018-02-24 MED ORDER — EPOETIN ALFA-EPBX 10000 UNIT/ML IJ SOLN
10000.0000 [IU] | INTRAMUSCULAR | Status: DC
  Administered 2018-02-24: 10000 [IU] via SUBCUTANEOUS
  Filled 2018-02-24: qty 1

## 2018-02-24 MED ORDER — SODIUM CHLORIDE 0.9 % IV SOLN
510.0000 mg | INTRAVENOUS | Status: DC
  Administered 2018-02-24: 510 mg via INTRAVENOUS
  Filled 2018-02-24: qty 17

## 2018-02-26 ENCOUNTER — Encounter (HOSPITAL_COMMUNITY): Payer: Medicare Other

## 2018-02-27 ENCOUNTER — Ambulatory Visit (HOSPITAL_COMMUNITY)
Admission: RE | Admit: 2018-02-27 | Discharge: 2018-02-27 | Disposition: A | Discharge status: Home / Self Care | Payer: Medicare Other | Source: Ambulatory Visit | Attending: Cardiology | Admitting: Cardiology

## 2018-02-27 ENCOUNTER — Other Ambulatory Visit: Payer: Self-pay

## 2018-02-27 ENCOUNTER — Telehealth: Payer: Self-pay | Admitting: Cardiology

## 2018-02-27 ENCOUNTER — Telehealth: Payer: Self-pay | Admitting: Internal Medicine

## 2018-02-27 DIAGNOSIS — M79672 Pain in left foot: Secondary | ICD-10-CM | POA: Diagnosis not present

## 2018-02-27 DIAGNOSIS — M79605 Pain in left leg: Secondary | ICD-10-CM | POA: Diagnosis present

## 2018-02-27 DIAGNOSIS — M109 Gout, unspecified: Secondary | ICD-10-CM

## 2018-02-27 NOTE — Telephone Encounter (Signed)
Yes MRI would be good

## 2018-02-27 NOTE — Progress Notes (Signed)
[name] [address], [place] [address] Phone: [phone] Subjective:   [name], am serving as a scribe for Dr. Hulan Saas.  I'm seeing this patient by the request  of:    CC: Toe pain follow-up  FYB:[id number]    02/13/2018: Redness noted is significantly improved.  Believe that the gout flare is somewhat improved.  Will be difficult to manage secondary to patient's kidney issues.  03/03/2018: [name] is a 85 y.o. male coming in with complaint of right great toe pain. Patient has been having progressively worse pain since the injection. Had doppler on Thursday, January 20th. Continued swelling of right lower leg. States that pain is sharp and having a sheet on the foot increases pain.  Patient was started on doxycycline.  Patient's Doppler was done and was unremarkable for any type of poor perfusion.  Reviewing patient's chart the patient continues to have iron deficiency that is severe.     Past Medical History:  Diagnosis Date  . Acute superficial venous thrombosis of lower extremity    a. RLE after CABG, neg dopp for DVT.  [name] Allergy   . Atrial fibrillation ([facility])    a. Post-op from CABG, on amiodarone temporarily, d/c'd 12/2011  . CAD (coronary artery disease)    a. S/P stenting to mid RCA, prox PDA 06/1999. b. NSTEMI/CABG x 3 in 10/2011 with LIMA to LAD, SVG to PDA, and SVG to OM1.   . Chronic UTI    a. Followed by Dr. Risa Grill - colonized/asymptomatic - not on abx  . CKD (chronic kidney disease), stage III (HCC)    a. stable with a creatinine around 1.9-2.0 followed by nephrology.  . Dyslipidemia   . GERD (gastroesophageal reflux disease)   . Hypertension    well-controlled.  . Myocardial infarction ([facility])   . Neck injury    a. C3-C4 and C4-C5 foraminal narrowing, severe  . Prostate cancer ([facility])    a. 2001 s/p TURP.  . Pulmonary nodule    a. felt to be noncancerous.  Status post followup CT scan 4 mm and  stable.  . Renal artery stenosis ([facility])    a. 50% by cath 2001  . 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 cardia catherization  07-07-99  . CARDIAC SURGERY  10/18/12   open heart surgery  . CATARACT EXTRACTION W/ INTRAOCULAR LENS  IMPLANT, BILATERAL  3/205, 06/2013   [facility]  . COLONOSCOPY  04/12/07  . CORONARY ARTERY BYPASS GRAFT  10/19/2011   Procedure: CORONARY ARTERY BYPASS GRAFTING (CABG);  Surgeon: Gaye Pollack, MD;  Location: [facility];  Service: Open Heart Surgery;  Laterality: N/A;  times three using Left Internal Mammary Artery and Right Greater Saphenouse Vein Graft harvested Endoscopically  . edg  07-17-1994  . FLEXIBLE SIGMOIDOSCOPY  11-03-1997  . LEFT HEART CATHETERIZATION WITH CORONARY ANGIOGRAM N/A 10/16/2011   Procedure: LEFT HEART CATHETERIZATION WITH CORONARY ANGIOGRAM;  Surgeon: Peter M Martinique, MD;  Location: [facility] CATH LAB;  Service: Cardiovascular;  Laterality: N/A;  . LEFT HEART CATHETERIZATION WITH CORONARY/GRAFT ANGIOGRAM N/A 03/11/2013   Procedure: LEFT HEART CATHETERIZATION WITH [name];  Surgeon: Blane Ohara, MD;  Location: [facility] CATH LAB;  Service: Cardiovascular;  Laterality: N/A;  . lumbar spinal disk and neck fusion surgery    . PACEMAKER INSERTION  03/28/12   PPM implanted for mobitz  II AV block  . peripheral vascular catherization  11-24-03  . PERMANENT PACEMAKER INSERTION N/A 03/28/2012   Procedure: PERMANENT PACEMAKER INSERTION;  Surgeon: Thompson Grayer, MD;  Location: [facility] CATH LAB;  Service: Cardiovascular;  Laterality: N/A;  . PROSTATECTOMY    . renal circulation  10-01-03  . s/p ptca    . stents     X 2  . stress cardiolite  05-04-05   spring 09-negative except for apical thinning, EF 68%   Social History   Socioeconomic History  . Marital status: Married    Spouse name: [name]  . Number of children: 2  . Years of education: Not on file  . Highest  education level: Not on file  Occupational History  . 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 Financial resource strain: Not on file  . Food insecurity:    Worry: Not on file    Inability: Not on file  . Transportation needs:    Medical: Not on file    Non-medical: Not on file  Tobacco Use  . Smoking status: Never Smoker  . Smokeless tobacco: Never Used  Substance and Sexual Activity  . Alcohol use: Yes    Comment: Rarely  . Drug use: [name]  . Sexual activity: Not on file  Lifestyle  . Physical activity:    Days per week: Not on file    Minutes per session: Not on file  . Stress: Not on file  Relationships  . Social connections:    Talks on phone: Not on file    Gets together: Not on file    Attends religious service: Not on file    Active member of club or organization: Not on file    Attends meetings of clubs or organizations: Not on file    Relationship status: Not on file  Other Topics Concern  . Not on file  Social History Narrative   HSG, 1 year college.  married '52 - 3 years, divorced; married '56 - 3 years divorced; married '22-12 yrs - divorced; married '75 -. 1 son '57; 1 daughter - '53; 1 grandchild.  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  Very happily married.  End of life care: yes CPR, [name], [name].   Allergies  Allergen Reactions  . Aspirin Nausea And Vomiting and Other (See Comments)    REACTION: upset stomach; tolerates coated aspirin REACTION: upset stomach; tolerates coated aspirin On hold  . Amoxicillin Nausea Only  . Atarax [Hydroxyzine Hcl] Other (See Comments)    REACTION: unknown  . Cephalexin Diarrhea  . Ciprofloxacin Nausea Only  . Clindamycin   . Clobetasol     Non effective  . Codeine      REACTION: Stomach upset  . Fluarix [Flu Virus Vaccine] Itching  . Haemophilus Influenzae Itching  . Hydrocodone Nausea Only  . Hydrocodone-Acetaminophen Nausea And Vomiting  . Hydroxyzine Nausea And Vomiting  . Latex Itching    After flu shot  . Lisinopril     After 3 tablets, he experienced trouble swallowing, throat tightness and hoarseness.   Santiago Bur [Nitrofurantoin Macrocrystal] Nausea Only  . Niacin Other (See Comments)    REACTION: unknown  . Niacin-Lovastatin Er     Unsure of reaction. Taking simvastatin  at home without problems  . Niacin-Lovastatin Er   . Nitrofurantoin Other (See Comments)    REACTION: upset stomach REACTION: upset stomach  . Vibramycin  [Doxycycline]   . Bactrim [Sulfamethoxazole-Trimethoprim] Rash  . Colchicine Rash   Family History  Problem Relation Age of Onset  . Coronary artery disease Father        died @ 18  . Other Mother        cerebral hemorrhage - died @ 44  . Arthritis Mother   . Cancer Brother        Bladder  . Prostate cancer Brother   . Nephritis Brother        died @ age 58.  . Other Brother        cerebral hemorrhage - died @ 9  . Heart disease Brother   . Breast cancer Other        niece x 2  . Diabetes Neg Hx   . Colon cancer Neg Hx   . Adrenal disorder Neg Hx      Current Outpatient Medications (Cardiovascular):  .  furosemide (LASIX) 40 MG tablet, Take 1 tablet (40 mg total) by mouth as needed for fluid or edema. .  metoprolol succinate (TOPROL-XL) 25 MG 24 hr tablet, Take 1 tablet (25 mg total) by mouth at bedtime. .  nitroGLYCERIN (NITROSTAT) 0.4 MG SL tablet, DISSOLVE 1 TABLET UNDER THE TONGUE EVERY 5 MINUTES AS NEEDED FOR CHEST PAIN (MAX OF 3 TABLETS) .  simvastatin (ZOCOR) 20 MG tablet, TAKE 1 TABLET DAILY (ANNUAL APPOINTMENT DUE IN [place]. MUST SEE DOCTOR FOR REFILLS)    Current Outpatient Medications (Hematological):  [name]  Cyanocobalamin (VITAMIN B-12 IJ), Inject 1 mL as directed every 30 (thirty) days. .   folic acid (FOLVITE) 1 MG tablet, TAKE 1 TABLET DAILY  Current Outpatient Medications (Other):  .  carboxymethylcellulose (REFRESH PLUS) 0.5 % SOLN, Place 1 drop into both eyes daily as needed (dry eyes). [name]  doxycycline (VIBRA-TABS) 100 MG tablet, Take 1 tablet (100 mg total) by mouth 2 (two) times daily. .  Multiple Vitamins-Minerals (PRESERVISION AREDS 2 PO), Take 1 tablet by mouth daily. [name]  omeprazole (PRILOSEC) 40 MG capsule, Take 40 mg by mouth daily. Before breakfast .  triamcinolone cream (KENALOG) 0.1 %, Apply 1 application topically 2 (two) times daily. .  vitamin C (ASCORBIC ACID) 500 MG tablet, Take 1,000 mg by mouth daily.     Past medical history, social, surgical and family history all reviewed in electronic medical record.  [name] pertanent information unless stated regarding to the chief complaint.   Review of Systems:  [name] headache, visual changes, nausea, vomiting, diarrhea, constipation, dizziness, abdominal pain, skin rash, fevers, chills, night sweats, weight loss, swollen lymph nodes, , chest pain, shortness of breath, mood changes.  Positive muscle aches, joint swelling, body aches  Objective  Blood pressure (!) 144/82, pulse 85, height 5\' 11"  (1.803 m), weight 158 lb (71.7 kg), SpO2 99 %.   General: [name] apparent distress alert and oriented x3 mood and affect normal, dressed appropriately.  HEENT: Pupils equal, extraocular movements intact  Respiratory: Patient's speak in full sentences and does not appear short of breath  Cardiovascular: [name] lower extremity edema, non tender, [name] erythema  Skin: Warm dry intact with [name] signs of infection or rash on extremities or on axial skeleton.  Abdomen: Soft nontender  Neuro: Cranial nerves II through XII are intact, neurovascularly intact in all extremities with 2+ DTRs and 2+ pulses.  Lymph:  [name] lymphadenopathy of posterior or anterior cervical chain or axill antalgic with favoring the right leg.  Patient is using a walker. MSK:=  Right foot exam shows the patient still has some erythema and swelling around the first MTP joint.  Mild warmth to palpation.  Patient has pain with flexion extension of the toe.  Neurovascular intact distally.  Patient does have 2+ pitting edema just proximal to the ankle by 2 cm.  Onychomycosis of the nails noted breakdown of the longitudinal and transverse arch noted.    Impression and Recommendations:     This case required medical decision making of moderate complexity. The above documentation has been reviewed and is accurate and complete Lyndal Pulley, DO       Note: This dictation was prepared with Dragon dictation along with smaller phrase technology. Any transcriptional errors that result from this process are unintentional.

## 2018-02-27 NOTE — Telephone Encounter (Signed)
Spoke w/ patient and he wanted to know if he could have an MRI of his foot w/ his device. I informed pt that he would need to call [employer] to determine if his device is MRI safe. I also informed pt that I am not sure if he can do an MRI of just that foot that would need to be discussed by the facility that is performing the MRI. Pt verbalized understanding and stated that he would call [employer] Tech support for more information about his device.

## 2018-02-27 NOTE — Telephone Encounter (Signed)
Copied from [place] [id number]. Topic: General - Other >> Feb 27, 2018  9:20 AM Keene Breath wrote: Reason for CRM: Patient called to leave a message for Dr. Quay Burow and Dr. Tamala Julian.  Patient stated that he saw his kidney doctor yesterday and his Delman Cheadle has gotten worse.  The skins is red and shiny.  The kidney doctor recommends an MRI.  Please call patient back [phone]

## 2018-02-28 MED ORDER — DOXYCYCLINE HYCLATE 100 MG PO TABS: 100.0000 mg | ORAL_TABLET | Freq: Two times a day (BID) | ORAL | 0 refills | Status: DC

## 2018-02-28 NOTE — Telephone Encounter (Signed)
[name], Could Dr. Raeford Razor fill this RX for patient?

## 2018-02-28 NOTE — Telephone Encounter (Signed)
Pt is calling to let dr Tamala Julian know its ok for him to take doxycycline per kidney doctor. [facility]

## 2018-02-28 NOTE — Telephone Encounter (Signed)
Fyi, Dr. Tamala Julian placed the MRI

## 2018-03-01 MED ORDER — DOXYCYCLINE HYCLATE 100 MG PO TABS: 100.0000 mg | ORAL_TABLET | Freq: Two times a day (BID) | ORAL | 0 refills | Status: DC

## 2018-03-01 NOTE — Addendum Note (Signed)
Addended by: Lyndal Pulley on: 03/01/2018 08:05 PM   Modules accepted: Orders

## 2018-03-03 ENCOUNTER — Ambulatory Visit (INDEPENDENT_AMBULATORY_CARE_PROVIDER_SITE_OTHER): Payer: Medicare Other

## 2018-03-03 ENCOUNTER — Ambulatory Visit (INDEPENDENT_AMBULATORY_CARE_PROVIDER_SITE_OTHER): Payer: Medicare Other | Admitting: Family Medicine

## 2018-03-03 ENCOUNTER — Encounter: Payer: Self-pay | Admitting: Family Medicine

## 2018-03-03 VITALS — BP 144/82 | HR 85 | Ht 71.0 in | Wt 158.0 lb

## 2018-03-03 DIAGNOSIS — E538 Deficiency of other specified B group vitamins: Secondary | ICD-10-CM | POA: Diagnosis not present

## 2018-03-03 DIAGNOSIS — M79671 Pain in right foot: Secondary | ICD-10-CM

## 2018-03-03 DIAGNOSIS — I25119 Atherosclerotic heart disease of native coronary artery with unspecified angina pectoris: Secondary | ICD-10-CM | POA: Diagnosis not present

## 2018-03-03 DIAGNOSIS — M109 Gout, unspecified: Secondary | ICD-10-CM | POA: Diagnosis not present

## 2018-03-03 MED ORDER — CYANOCOBALAMIN 1000 MCG/ML IJ SOLN
1000.0000 ug | Freq: Once | INTRAMUSCULAR | Status: AC
  Administered 2018-03-03: 1000 ug via INTRAMUSCULAR

## 2018-03-03 NOTE — Patient Instructions (Signed)
Good to see you  [name] is your friend We will get CT scan  Try the boot and if comfortable try it with any walking.  We will be talking to you after the test

## 2018-03-03 NOTE — Assessment & Plan Note (Signed)
Appear to be more of a gouty arthritis.  Patient though failed a intra-articular injection.  Do not see any significant laboratory findings that would be consistent with a potential infection but started on doxycycline again.  Discussed with patient in great length.  Does have some mild breakdown of the skin noted in the vicinity including could be more of this than anything else.  Patient's vascular supply seems to be unremarkable with patient's most recent Dopplers.  Patient also had good ABIs.  I feel at this point patient has failed all conservative therapy and is worsening.  CAM Walker given and patient will have a CT scan done.  We will see if patient has any type of infectious etiology or an occult fracture that could be contributing.  Patient is in agreement with the plan and all questions answered.  Spent  25 minutes with patient face-to-face and had greater than 50% of counseling including as described above in assessment and plan.

## 2018-03-03 NOTE — Progress Notes (Signed)
b12 Injection given.   Venida Tsukamoto J Andron Marrazzo, MD  

## 2018-03-04 ENCOUNTER — Encounter (HOSPITAL_COMMUNITY)
Admission: RE | Admit: 2018-03-04 | Discharge: 2018-03-04 | Disposition: A | Discharge status: Home / Self Care | Payer: Medicare Other | Source: Ambulatory Visit | Attending: Nephrology | Admitting: Nephrology

## 2018-03-04 ENCOUNTER — Encounter: Payer: Self-pay | Admitting: Neurology

## 2018-03-04 ENCOUNTER — Telehealth: Payer: Self-pay | Admitting: Neurology

## 2018-03-04 ENCOUNTER — Ambulatory Visit (INDEPENDENT_AMBULATORY_CARE_PROVIDER_SITE_OTHER): Payer: Medicare Other | Admitting: Neurology

## 2018-03-04 VITALS — BP 132/66 | HR 85 | Ht 71.0 in | Wt 160.0 lb

## 2018-03-04 VITALS — BP 138/64 | HR 67 | Temp 98.1°F | Resp 18 | Ht 71.0 in | Wt 156.0 lb

## 2018-03-04 DIAGNOSIS — N183 Chronic kidney disease, stage 3 unspecified: Secondary | ICD-10-CM

## 2018-03-04 DIAGNOSIS — I25119 Atherosclerotic heart disease of native coronary artery with unspecified angina pectoris: Secondary | ICD-10-CM

## 2018-03-04 DIAGNOSIS — R258 Other abnormal involuntary movements: Secondary | ICD-10-CM

## 2018-03-04 LAB — POCT HEMOGLOBIN-HEMACUE: Hemoglobin: 9.3 g/dL — ABNORMAL LOW (ref 13.0–17.0)

## 2018-03-04 MED ORDER — SODIUM CHLORIDE 0.9 % IV SOLN
510.0000 mg | INTRAVENOUS | Status: AC
  Administered 2018-03-04: 510 mg via INTRAVENOUS
  Filled 2018-03-04: qty 510

## 2018-03-04 MED ORDER — EPOETIN ALFA-EPBX 10000 UNIT/ML IJ SOLN
10000.0000 [IU] | INTRAMUSCULAR | Status: DC
  Administered 2018-03-04: 10000 [IU] via SUBCUTANEOUS
  Filled 2018-03-04: qty 1

## 2018-03-04 NOTE — Patient Instructions (Signed)
1.  We will check EEG. 2.  For nerve pain in legs, agree with gabapentin or Lyrica 3.  Further recommendations pending results.

## 2018-03-04 NOTE — Telephone Encounter (Signed)
Pt was seen again by Dr. Tomi Likens.

## 2018-03-04 NOTE — Telephone Encounter (Signed)
Patient left vm that he is on his way back to office because of his arm loosing control. Please advise. Thanks!
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
## 2018-03-07 ENCOUNTER — Ambulatory Visit
Admission: RE | Admit: 2018-03-07 | Discharge: 2018-03-07 | Disposition: A | Discharge status: Home / Self Care | Payer: Medicare Other | Source: Ambulatory Visit | Attending: Family Medicine | Admitting: Family Medicine

## 2018-03-07 DIAGNOSIS — M79671 Pain in right foot: Secondary | ICD-10-CM

## 2018-03-10 ENCOUNTER — Ambulatory Visit (INDEPENDENT_AMBULATORY_CARE_PROVIDER_SITE_OTHER): Payer: Medicare Other | Admitting: Neurology

## 2018-03-10 DIAGNOSIS — R258 Other abnormal involuntary movements: Secondary | ICD-10-CM

## 2018-03-11 ENCOUNTER — Encounter: Payer: Self-pay | Admitting: Family Medicine

## 2018-03-11 ENCOUNTER — Encounter: Payer: Self-pay | Admitting: Internal Medicine

## 2018-03-11 ENCOUNTER — Ambulatory Visit (INDEPENDENT_AMBULATORY_CARE_PROVIDER_SITE_OTHER): Payer: Medicare Other | Admitting: Family Medicine

## 2018-03-11 ENCOUNTER — Ambulatory Visit (INDEPENDENT_AMBULATORY_CARE_PROVIDER_SITE_OTHER): Payer: Medicare Other | Admitting: Internal Medicine

## 2018-03-11 VITALS — BP 140/82 | HR 79 | Resp 16 | Ht 71.0 in | Wt 157.0 lb

## 2018-03-11 DIAGNOSIS — N183 Chronic kidney disease, stage 3 unspecified: Secondary | ICD-10-CM

## 2018-03-11 DIAGNOSIS — M109 Gout, unspecified: Secondary | ICD-10-CM | POA: Diagnosis not present

## 2018-03-11 DIAGNOSIS — K922 Gastrointestinal hemorrhage, unspecified: Secondary | ICD-10-CM | POA: Diagnosis not present

## 2018-03-11 DIAGNOSIS — I10 Essential (primary) hypertension: Secondary | ICD-10-CM | POA: Diagnosis not present

## 2018-03-11 DIAGNOSIS — I25119 Atherosclerotic heart disease of native coronary artery with unspecified angina pectoris: Secondary | ICD-10-CM

## 2018-03-11 DIAGNOSIS — G253 Myoclonus: Secondary | ICD-10-CM

## 2018-03-11 NOTE — Assessment & Plan Note (Signed)
Saw neurology EEG within normal limits We will follow-up with neurology regarding further treatment/evaluation

## 2018-03-11 NOTE — Procedures (Signed)
ELECTROENCEPHALOGRAM REPORT  Date of Study: 03/11/2018  Patient's Name: [name] MRN: [id number] Date of Birth: 1926-01-18  Referring Provider: Metta Clines, DO  Clinical History: 85 year old man with episodic abnormal left arm movements.  Medications: VITAMIN B-12 IJ   VIBRA-TABS 100 MG tablet FOLVITE 1 MG tablet  LASIX 40 MG tablet  TOPROL-XL 25 MG 24 hr tablet  PRESERVISION AREDS 2 PO   NITROSTAT 0.4 MG SL tablet  PRILOSEC 40 MG capsule   ZOCOR 20 MG tablet  KENALOG 0.1 %  ASCORBIC ACID 500 MG tablet  Technical Summary: A multichannel digital EEG recording measured by the international 10-20 system with electrodes applied with paste and impedances below 5000 ohms performed in our laboratory with EKG monitoring in an awake and asleep patient.  Hyperventilation was not performed due to cardiac history.  Photic stimulation was performed.  The digital EEG was referentially recorded, reformatted, and digitally filtered in a variety of bipolar and referential montages for optimal display.    Description: The patient is awake and asleep during the recording.  During maximal wakefulness, there is a symmetric, medium voltage 9-10 Hz posterior dominant rhythm that attenuates with eye opening.  The record is symmetric.  During drowsiness and sleep, there is an increase in theta slowing of the background.  Vertex waves and symmetric sleep spindles were seen.  Photic stimulation did not elicit any abnormalities.  There were no epileptiform discharges or electrographic seizures seen.    EKG lead was unremarkable.  Impression: This awake and asleep EEG is normal.    Clinical Correlation: A normal EEG does not exclude a clinical diagnosis of epilepsy.  If further clinical questions remain, prolonged EEG may be helpful.  Clinical correlation is advised.   Metta Clines, DO

## 2018-03-11 NOTE — Progress Notes (Signed)
[name] [address] [place], [place] [address] Phone: [phone] Subjective:     CC: Right foot pain  WCH:ENIDPOEUMP    03/03/2018: Appear to be more of a gouty arthritis.  Patient though failed a intra-articular injection.  Do not see any significant laboratory findings that would be consistent with a potential infection but started on doxycycline again.  Discussed with patient in great length.  Does have some mild breakdown of the skin noted in the vicinity including could be more of this than anything else.  Patient's vascular supply seems to be unremarkable with patient's most recent Dopplers.  Patient also had good ABIs.  I feel at this point patient has failed all conservative therapy and is worsening.  CAM Walker given and patient will have a CT scan done.  We will see if patient has any type of infectious etiology or an occult fracture that could be contributing.  Patient is in agreement with the plan and all questions answered.  Update 03/11/2018: [name] is a 85 y.o. male coming in with complaint of right foot pain. Patient states that he has been wearing the boot since last visit. Is here for his CT results. Does feel that the boot is helping to relieve his pain. Has finished the antibiotics.  CT scan was independently visualized by me.    Past Medical History:  Diagnosis Date  . Acute superficial venous thrombosis of lower extremity    a. RLE after CABG, neg dopp for DVT.  [name] Allergy   . Atrial fibrillation ([place])    a. Post-op from CABG, on amiodarone temporarily, d/c'd 12/2011  . CAD (coronary artery disease)    a. S/P stenting to mid RCA, prox PDA 06/1999. b. NSTEMI/CABG x 3 in 10/2011 with LIMA to LAD, SVG to PDA, and SVG to OM1.   . Chronic UTI    a. Followed by Dr. Risa Grill - colonized/asymptomatic - not on abx  . CKD (chronic kidney disease), stage III (HCC)    a. stable with a creatinine around 1.9-2.0 followed by nephrology.    . Dyslipidemia   . GERD (gastroesophageal reflux disease)   . Hypertension    well-controlled.  . Myocardial infarction ([place])   . Neck injury    a. C3-C4 and C4-C5 foraminal narrowing, severe  . Prostate cancer ([place])    a. 2001 s/p TURP.  . Pulmonary nodule    a. felt to be noncancerous.  Status post followup CT scan 4 mm and stable.  . Renal artery stenosis ([place])    a. 50% by cath 2001  . 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 cardia catherization  07-07-99  . CARDIAC SURGERY  10/18/12   open heart surgery  . CATARACT EXTRACTION W/ INTRAOCULAR LENS  IMPLANT, BILATERAL  3/205, 06/2013   m[facility]uen  . COLONOSCOPY  04/12/07  . CORONARY ARTERY BYPASS GRAFT  10/19/2011   Procedure: CORONARY ARTERY BYPASS GRAFTING (CABG);  Surgeon: Gaye Pollack, MD;  Location: [facility];  Service: Open Heart Surgery;  Laterality: N/A;  times three using Left Internal Mammary Artery and Right Greater Saphenouse Vein Graft harvested Endoscopically  . edg  07-17-1994  . FLEXIBLE SIGMOIDOSCOPY  11-03-1997  . LEFT HEART CATHETERIZATION WITH CORONARY ANGIOGRAM N/A 10/16/2011   Procedure: LEFT HEART CATHETERIZATION WITH CORONARY ANGIOGRAM;  Surgeon: Peter M Martinique, MD;  Location: [facility] CATH  LAB;  Service: Cardiovascular;  Laterality: N/A;  . LEFT HEART CATHETERIZATION WITH CORONARY/GRAFT ANGIOGRAM N/A 03/11/2013   Procedure: LEFT HEART CATHETERIZATION WITH [name];  Surgeon: Blane Ohara, MD;  Location: [facility] CATH LAB;  Service: Cardiovascular;  Laterality: N/A;  . lumbar spinal disk and neck fusion surgery    . PACEMAKER INSERTION  03/28/12   PPM implanted for mobitz II AV block  . peripheral vascular catherization  11-24-03  . PERMANENT PACEMAKER INSERTION N/A 03/28/2012   Procedure: PERMANENT PACEMAKER INSERTION;  Surgeon: Thompson Grayer, MD;  Location: [facility] CATH LAB;  Service: Cardiovascular;  Laterality: N/A;  .  PROSTATECTOMY    . renal circulation  10-01-03  . s/p ptca    . stents     X 2  . stress cardiolite  05-04-05   spring 09-negative except for apical thinning, EF 68%   Social History   Socioeconomic History  . Marital status: Married    Spouse name: [name]  . Number of children: 2  . Years of education: Not on file  . Highest education level: Not on file  Occupational History  . 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 Financial resource strain: Not on file  . Food insecurity:    Worry: Not on file    Inability: Not on file  . Transportation needs:    Medical: Not on file    Non-medical: Not on file  Tobacco Use  . Smoking status: Never Smoker  . Smokeless tobacco: Never Used  Substance and Sexual Activity  . Alcohol use: Yes    Comment: Rarely  . Drug use: No  . Sexual activity: Not on file  Lifestyle  . Physical activity:    Days per week: Not on file    Minutes per session: Not on file  . Stress: Not on file  Relationships  . Social connections:    Talks on phone: Not on file    Gets together: Not on file    Attends religious service: Not on file    Active member of club or organization: Not on file    Attends meetings of clubs or organizations: Not on file    Relationship status: Not on file  Other Topics Concern  . Not on file  Social History Narrative   HSG, 1 year college.  married '52 - 3 years, divorced; married '56 - 3 years divorced; married '30-12 yrs - divorced; married '75 -. 1 son '57; 1 daughter - '53; 1 grandchild.  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  Very happily married.  End of life care: yes CPR, no long term mechanical ventilation, no heroic measures.   Allergies  Allergen Reactions  . Aspirin Nausea And Vomiting and Other (See Comments)      REACTION: upset stomach; tolerates coated aspirin REACTION: upset stomach; tolerates coated aspirin On hold  . Amoxicillin Nausea Only  . Atarax [Hydroxyzine Hcl] Other (See Comments)    REACTION: unknown  . Cephalexin Diarrhea  . Ciprofloxacin Nausea Only  . Clindamycin   . Clobetasol     Non effective  . Codeine     REACTION: Stomach upset  . Fluarix [Flu Virus Vaccine] Itching  . Haemophilus Influenzae Itching  . Hydrocodone Nausea Only  . Hydrocodone-Acetaminophen Nausea And  Vomiting  . Hydroxyzine Nausea And Vomiting  . Latex Itching    After flu shot  . Lisinopril     After 3 tablets, he experienced trouble swallowing, throat tightness and hoarseness.   Santiago Bur [Nitrofurantoin Macrocrystal] Nausea Only  . Niacin Other (See Comments)    REACTION: unknown  . Niacin-Lovastatin Er     Unsure of reaction. Taking simvastatin at home without problems  . Niacin-Lovastatin Er   . Nitrofurantoin Other (See Comments)    REACTION: upset stomach REACTION: upset stomach  . Vibramycin  [Doxycycline]   . Bactrim [Sulfamethoxazole-Trimethoprim] Rash  . Colchicine Rash   Family History  Problem Relation Age of Onset  . Coronary artery disease Father        died @ 61  . Other Mother        cerebral hemorrhage - died @ 40  . Arthritis Mother   . Cancer Brother        Bladder  . Prostate cancer Brother   . Nephritis Brother        died @ age 87.  . Other Brother        cerebral hemorrhage - died @ 71  . Heart disease Brother   . Breast cancer Other        niece x 2  . Diabetes Neg Hx   . Colon cancer Neg Hx   . Adrenal disorder Neg Hx      Current Outpatient Medications (Cardiovascular):  .  furosemide (LASIX) 40 MG tablet, Take 1 tablet (40 mg total) by mouth as needed for fluid or edema. .  metoprolol succinate (TOPROL-XL) 25 MG 24 hr tablet, Take 1 tablet (25 mg total) by mouth at bedtime. .  nitroGLYCERIN (NITROSTAT) 0.4 MG SL tablet, DISSOLVE 1 TABLET UNDER  THE TONGUE EVERY 5 MINUTES AS NEEDED FOR CHEST PAIN (MAX OF 3 TABLETS) .  simvastatin (ZOCOR) 20 MG tablet, TAKE 1 TABLET DAILY (ANNUAL APPOINTMENT DUE IN [place]. MUST SEE DOCTOR FOR REFILLS) (Patient taking differently: On hold for now)    Current Outpatient Medications (Hematological):  [name]  Cyanocobalamin (VITAMIN B-12 IJ), Inject 1 mL as directed every 30 (thirty) days. [name]  epoetin alfa-epbx (RETACRIT) 2000 UNIT/ML injection, 2,000 Units 3 (three) times a week. .  ferumoxytol (FERAHEME) 510 MG/17ML SOLN injection, Inject 510 mg into the vein once. .  folic acid (FOLVITE) 1 MG tablet, TAKE 1 TABLET DAILY  Current Outpatient Medications (Other):  .  carboxymethylcellulose (REFRESH PLUS) 0.5 % SOLN, Place 1 drop into both eyes daily as needed (dry eyes). .  Multiple Vitamins-Minerals (PRESERVISION AREDS 2 PO), Take 1 tablet by mouth daily. [name]  omeprazole (PRILOSEC) 40 MG capsule, Take 40 mg by mouth daily. Before breakfast .  triamcinolone cream (KENALOG) 0.1 %, Apply 1 application topically 2 (two) times daily. .  vitamin C (ASCORBIC ACID) 500 MG tablet, Take 1,000 mg by mouth daily.  .  Vitamin D, Ergocalciferol, (DRISDOL) 1.25 MG (50000 UT) CAPS capsule, Take 50,000 Units by mouth every 7 (seven) days.    Past medical history, social, surgical and family history all reviewed in electronic medical record.  No pertanent information unless stated regarding to the chief complaint.   Review of Systems:  No headache, visual changes, nausea, vomiting, diarrhea, constipation, dizziness, abdominal pain, skin rash, fevers, chills, night sweats, weight loss, swollen lymph nodes,, chest pain, shortness of breath, mood changes.  Positive muscle aches and body aches  Objective  Blood pressure 140/82, pulse 79, height  5\' 11"  (1.803 m), weight 157 lb (71.2 kg), SpO2 97 %.   General: No apparent distress alert and oriented x3 mood and affect normal, dressed appropriately.  HEENT: Pupils equal,  extraocular movements intact  Respiratory: Patient's speak in full sentences and does not appear short of breath  Cardiovascular: No lower extremity edema, non tender, no erythema  Skin: Warm dry intact with no signs of infection or rash on extremities or on axial skeleton.  Abdomen: Soft nontender  Neuro: Cranial nerves II through XII are intact, neurovascularly intact in all extremities with 2+ DTRs and 2+ pulses.  Lymph: No lymphadenopathy of posterior or anterior cervical chain or axillae bilaterally.  Gait antalgic MSK:  tender with limited range of motion and good stability and symmetric strength and tone of shoulders, elbows, wrist, hip, knee and ankles bilaterally.  Arthritic changes  Patient foot exam shows the patient does have fibular deviation Still tenderness over the first metatarsal.    Impression and Recommendations:    y. The above documentation has been reviewed and is accurate and complete Lyndal Pulley, DO       Note: This dictation was prepared with Dragon dictation along with smaller phrase technology. Any transcriptional errors that result from this process are unintentional.

## 2018-03-11 NOTE — Assessment & Plan Note (Signed)
Following with cardiology No chest pain, palpitations or shortness of breath Continue current medications

## 2018-03-11 NOTE — Assessment & Plan Note (Signed)
Recent follow-up with Dr. Justin Mend Kidney function improved

## 2018-03-11 NOTE — Progress Notes (Signed)
Subjective:    Patient ID: [name], male    DOB: 1926-09-28, 85 y.o.   MRN: [id number]  HPI The patient is here for follow up.  Episodic arm and leg movements: [name] did see neurology.  An EEG was ordered to rule out simple partial seizures.  The EEG was [place].  If unremarkable Dr. Tomi Likens may try treating him with clonazepam.  They have not heard back from him regarding their next step.  Gouty arthritis right great toe, foot pain/swelling: His gout has cleared up.  [name] is currently wearing a boot, but did see sports medicine today and will be taking off the boot today.  CAD, Hypertension, chronic diastolic CHF: [name] is taking his medication daily. [name] is compliant with a low sodium diet.  [name] denies chest pain, palpitations, edema, shortness of breath and regular headaches. [name] is not exercising regularly.  Upper GI bleed-gastritis, iron deficiency anemia: [name] is taking omeprazole daily as prescribed. [name] has a follow up with Dr Earlean Shawl.   Chronic kidney disease: [name] is following with nephrology and saw Dr Justin Mend today.  His kidney function improved.    Lumbar spinal stenosis, left hip pain, bilateral trochanter bursitis: [name] is following with orthopedics - Dr Alvan Dame and Dr Maxie Better.  His left leg has an electric current about 85% of the time.  The bottoms of his feet bother him at times.  [name] has a cattle probe like in the hip intermittently.    [name] continues to be fatigued and achy. His joints ache and swell at times. [name] follows up with Dr Amil Amen next month.  [name] may want him to do PT.     Medications and allergies reviewed with patient and updated if appropriate.  Patient Active Problem List   Diagnosis Date Noted  . Myoclonic jerking 02/10/2018  . Chronic diastolic CHF (congestive heart failure) ([place]) 02/05/2018  . Pain and swelling of toe of right foot 02/01/2018  . UGIB (upper gastrointestinal bleed) 02/01/2018  . Hyperpigmentation 11/04/2017  . Abnormal appearance of skin 10/07/2017  .  Frequent urination 09/03/2017  . Gouty arthritis of right great toe 09/03/2017  . Postnasal drip 08/06/2017  . Rash and nonspecific skin eruption 08/06/2017  . Moderate aortic regurgitation 07/04/2017  . Diastolic dysfunction 32/20/2542  . Edema 06/29/2017  . DOE (dyspnea on exertion) 06/29/2017  . Sinus symptom 06/05/2017  . Fatigue 05/18/2017  . Sleeping difficulty 05/18/2017  . Foot pain, bilateral 04/22/2017  . Trochanteric bursitis 01/10/2017  . Internal hemorrhoids 08/15/2016  . Dysphagia 02/06/2016  . Cephalalgia 07/14/2015  . Constipation 03/01/2015  . Myalgia 03/02/2014  . CHB (complete heart block) ([place]) 05/03/2012  . Postoperative atrial fibrillation ([place]) 05/03/2012  . Pacemaker 04/10/2012  . 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 Coronary atherosclerosis 11/27/2011  . Presence of aortocoronary bypass graft 10/24/2011  . Pulmonary nodule   . CAD (coronary artery disease)   . CKD (chronic kidney disease) stage 3, GFR 30-59 ml/min (HCC)   . Venous insufficiency of leg 06/05/2010  . SHOULDER PAIN, RIGHT, CHRONIC 10/14/2009  . B12 deficiency 08/23/2009  . Peripheral neuropathy 03/21/2009  . DEGENERATIVE DISC DISEASE, CERVICAL SPINE, W/RADICULOPATHY 09/21/2008  . Dyslipidemia 01/17/2007  . Essential hypertension 07/20/2006  . Prostate cancer ([place]) 07/20/2006    Current Outpatient Medications on File Prior to Visit  Medication Sig Dispense Refill  . carboxymethylcellulose (REFRESH PLUS) 0.5 % SOLN Place 1 drop into both eyes daily as needed (dry eyes).  30 mL 1  . Cyanocobalamin (VITAMIN B-12 IJ) Inject 1 mL as directed every 30 (thirty) days.    . folic acid (FOLVITE) 1 MG tablet TAKE 1 TABLET DAILY 90 tablet 3  . furosemide (LASIX) 40 MG tablet Take 1 tablet (40 mg total) by mouth as needed for fluid or edema. 90 tablet 3  . metoprolol succinate (TOPROL-XL) 25 MG 24 hr tablet Take 1 tablet (25 mg total) by mouth at bedtime. 90 tablet 3  .  Multiple Vitamins-Minerals (PRESERVISION AREDS 2 PO) Take 1 tablet by mouth daily.    . nitroGLYCERIN (NITROSTAT) 0.4 MG SL tablet DISSOLVE 1 TABLET UNDER THE TONGUE EVERY 5 MINUTES AS NEEDED FOR CHEST PAIN (MAX OF 3 TABLETS) 100 tablet 4  . omeprazole (PRILOSEC) 40 MG capsule Take 40 mg by mouth daily. Before breakfast    . simvastatin (ZOCOR) 20 MG tablet TAKE 1 TABLET DAILY (ANNUAL APPOINTMENT DUE IN [place]. MUST SEE DOCTOR FOR REFILLS) (Patient taking differently: On hold for now) 90 tablet 1  . triamcinolone cream (KENALOG) 0.1 % Apply 1 application topically 2 (two) times daily. 30 g 0  . vitamin C (ASCORBIC ACID) 500 MG tablet Take 1,000 mg by mouth daily.     . Vitamin D, Ergocalciferol, (DRISDOL) 1.25 MG (50000 UT) CAPS capsule Take 50,000 Units by mouth every 7 (seven) days.     No current facility-administered medications on file prior to visit.     Past Medical History:  Diagnosis Date  . Acute superficial venous thrombosis of lower extremity    a. RLE after CABG, neg dopp for DVT.  [name] Allergy   . Atrial fibrillation ([place])    a. Post-op from CABG, on amiodarone temporarily, d/c'd 12/2011  . CAD (coronary artery disease)    a. S/P stenting to mid RCA, prox PDA 06/1999. b. NSTEMI/CABG x 3 in 10/2011 with LIMA to LAD, SVG to PDA, and SVG to OM1.   . Chronic UTI    a. Followed by Dr. Risa Grill - colonized/asymptomatic - not on abx  . CKD (chronic kidney disease), stage III (HCC)    a. stable with a creatinine around 1.9-2.0 followed by nephrology.  . Dyslipidemia   . GERD (gastroesophageal reflux disease)   . Hypertension    well-controlled.  . Myocardial infarction ([place])   . Neck injury    a. C3-C4 and C4-C5 foraminal narrowing, severe  . Prostate cancer ([place])    a. 2001 s/p TURP.  . Pulmonary nodule    a. felt to be noncancerous.  Status post followup CT scan 4 mm and stable.  . Renal artery stenosis ([place])    a. 50% by cath 2001  . Symptomatic bradycardia    Mobitz II AV  block s/p Medtronic pacemaker 03/28/12    Past Surgical History:  Procedure Laterality Date  . cardia catherization  07-07-99  . CARDIAC SURGERY  10/18/12   open heart surgery  . CATARACT EXTRACTION W/ INTRAOCULAR LENS  IMPLANT, BILATERAL  3/205, 06/2013   [facility]uen  . COLONOSCOPY  04/12/07  . CORONARY ARTERY BYPASS GRAFT  10/19/2011   Procedure: CORONARY ARTERY BYPASS GRAFTING (CABG);  Surgeon: Gaye Pollack, MD;  Location: [facility];  Service: Open Heart Surgery;  Laterality: N/A;  times three using Left Internal Mammary Artery and Right Greater Saphenouse Vein Graft harvested Endoscopically  . edg  07-17-1994  . FLEXIBLE SIGMOIDOSCOPY  11-03-1997  . LEFT HEART CATHETERIZATION WITH CORONARY ANGIOGRAM N/A 10/16/2011   Procedure: LEFT HEART CATHETERIZATION  WITH CORONARY ANGIOGRAM;  Surgeon: Peter M Martinique, MD;  Location[facility] CATH LAB;  Service: Cardiovascular;  Laterality: N/A;  . LEFT HEART CATHETERIZATION WITH CORONARY/GRAFT ANGIOGRAM N/A 03/11/2013   Procedure: LEFT HEART CATHETERIZATION WITH [name];  Surgeon: Blane Ohara, MD;  Location[facility] CATH LAB;  Service: Cardiovascular;  Laterality: N/A;  . lumbar spinal disk and neck fusion surgery    . PACEMAKER INSERTION  03/28/12   PPM implanted for mobitz II AV block  . peripheral vascular catherization  11-24-03  . PERMANENT PACEMAKER INSERTION N/A 03/28/2012   Procedure: PERMANENT PACEMAKER INSERTION;  Surgeon: Thompson Grayer, MD;  Location: [facility] CATH LAB;  Service: Cardiovascular;  Laterality: N/A;  . PROSTATECTOMY    . renal circulation  10-01-03  . s/p ptca    . stents     X 2  . stress cardiolite  05-04-05   spring 09-negative except for apical thinning, EF 68%    Social History   Socioeconomic History  . Marital status: Married    Spouse name: [name]  . Number of children: 2  . Years of education: Not on file  . Highest education level: Not on file  Occupational History  . 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 Financial resource strain: Not on file  . Food insecurity:    Worry: Not on file    Inability: Not on file  . Transportation needs:    Medical: Not on file    Non-medical: Not on file  Tobacco Use  . Smoking status: Never Smoker  . Smokeless tobacco: Never Used  Substance and Sexual Activity  . Alcohol use: Yes    Comment: Rarely  . Drug use: No  . Sexual activity: Not on file  Lifestyle  . Physical activity:    Days per week: Not on file    Minutes per session: Not on file  . Stress: Not on file  Relationships  . Social connections:    Talks on phone: Not on file    Gets together: Not on file    Attends religious service: Not on file    Active member of club or organization: Not on file    Attends meetings of clubs or organizations: Not on file    Relationship status: Not on file  Other Topics Concern  . Not on file  Social History Narrative   HSG, 1 year college.  married '52 - 3 years, divorced; married '56 - 3 years divorced; married '47-12 yrs - divorced; married '75 -. 1 son '57; 1 daughter - '53; 1 grandchild.  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  Very happily married.  End of life care: yes CPR, no long term mechanical ventilation, no heroic measures.    Family History  Problem Relation Age of Onset  . Coronary artery disease Father        died @ 83  . Other Mother        cerebral hemorrhage - died @ 72  . Arthritis Mother   . Cancer Brother        Bladder  . Prostate cancer Brother   . Nephritis Brother        died @ age 34.  . Other Brother  cerebral hemorrhage - died @ 55  . Heart disease Brother   . Breast cancer Other        niece x 2  . Diabetes Neg Hx   . Colon cancer Neg Hx   . Adrenal disorder Neg Hx     Review of Systems  Constitutional:  Positive for fatigue. Negative for fever.  HENT: Positive for postnasal drip (improved by flonase).   Respiratory: Negative for cough, shortness of breath and wheezing.   Cardiovascular: Negative for chest pain, palpitations and leg swelling.  Gastrointestinal: Negative for abdominal pain and nausea.       No gerd  Neurological: Negative for light-headedness and headaches.       Objective:   Vitals:   03/11/18 1409  BP: 140/82  Pulse: 79  Resp: 16  SpO2: 97%   BP Readings from Last 3 Encounters:  03/11/18 140/82  03/11/18 140/82  03/04/18 138/64   Wt Readings from Last 3 Encounters:  03/11/18 157 lb (71.2 kg)  03/11/18 157 lb (71.2 kg)  03/04/18 156 lb (70.8 kg)   Body mass index is 21.9 kg/m.   Physical Exam    Constitutional: Appears well-developed and well-nourished. No distress.  HENT:  Head: Normocephalic and atraumatic.  Neck: Neck supple. No tracheal deviation present. No thyromegaly present.  No cervical lymphadenopathy Cardiovascular: Normal rate, regular rhythm and normal heart sounds.   2/6 systolic murmur heard. No carotid bruit .  No edema Pulmonary/Chest: Effort normal and breath sounds normal. No respiratory distress. No has no wheezes. No rales.  Skin: Skin is warm and dry. Not diaphoretic.  Psychiatric: Normal mood and affect. Behavior is normal.      Assessment & Plan:    See Problem List for Assessment and Plan of chronic medical problems.

## 2018-03-11 NOTE — Patient Instructions (Signed)
Good to see you  Ice is your friend Try the cream daily for next week./  Continue lotion  Silicone spacer between toes with walking.  Otherwise it should get better

## 2018-03-11 NOTE — Assessment & Plan Note (Signed)
Currently controlled Continue current medications

## 2018-03-11 NOTE — Patient Instructions (Addendum)
   Medications reviewed and updated.  Changes include :   none

## 2018-03-11 NOTE — Assessment & Plan Note (Signed)
Mild arthritic changes.  Discussed with patient in great length.  Patient does have arthritic changes noted.  Discussed topical anti-inflammatories.  Discussed with patient having more dry skin that we need to consider the possibility of topical lotion. Discussed orthotics, spacer and avoid any other interventions

## 2018-03-11 NOTE — Assessment & Plan Note (Signed)
Hemoglobin improving Has GI follow-up soon Taking omeprazole No symptoms of GERD or GI bleed

## 2018-03-12 ENCOUNTER — Ambulatory Visit (HOSPITAL_COMMUNITY)
Admission: RE | Admit: 2018-03-12 | Discharge: 2018-03-12 | Disposition: A | Discharge status: Home / Self Care | Payer: Medicare Other | Source: Ambulatory Visit | Attending: Nephrology | Admitting: Nephrology

## 2018-03-12 VITALS — BP 149/79 | HR 73 | Resp 16

## 2018-03-12 DIAGNOSIS — N183 Chronic kidney disease, stage 3 unspecified: Secondary | ICD-10-CM

## 2018-03-12 MED ORDER — EPOETIN ALFA-EPBX 10000 UNIT/ML IJ SOLN
10000.0000 [IU] | INTRAMUSCULAR | Status: DC
  Administered 2018-03-12: 10000 [IU] via SUBCUTANEOUS
  Filled 2018-03-12: qty 1

## 2018-03-13 LAB — POCT HEMOGLOBIN-HEMACUE: Hemoglobin: 10.3 g/dL — ABNORMAL LOW (ref 13.0–17.0)

## 2018-03-17 ENCOUNTER — Encounter: Payer: Self-pay | Admitting: Internal Medicine

## 2018-03-17 ENCOUNTER — Ambulatory Visit (INDEPENDENT_AMBULATORY_CARE_PROVIDER_SITE_OTHER): Payer: Medicare Other | Admitting: Internal Medicine

## 2018-03-17 VITALS — BP 170/76 | HR 82 | Ht 71.0 in | Wt 158.2 lb

## 2018-03-17 DIAGNOSIS — I5032 Chronic diastolic (congestive) heart failure: Secondary | ICD-10-CM

## 2018-03-17 DIAGNOSIS — Z950 Presence of cardiac pacemaker: Secondary | ICD-10-CM

## 2018-03-17 DIAGNOSIS — I25119 Atherosclerotic heart disease of native coronary artery with unspecified angina pectoris: Secondary | ICD-10-CM

## 2018-03-17 DIAGNOSIS — R001 Bradycardia, unspecified: Secondary | ICD-10-CM | POA: Diagnosis not present

## 2018-03-17 NOTE — Progress Notes (Signed)
PCP: [name], MD Primary Cardiologist:  Dr Burt Knack Primary EP:  Dr Cherylann Ratel is a 85 y.o. male who presents today for routine electrophysiology followup.  Since last being seen in our clinic, the patient reports doing reasonably well.  He has no heart concerns.  His concerns are with back pain, leg pain, and poor appetite.  Today, he denies symptoms of palpitations, chest pain, shortness of breath,  lower extremity edema, dizziness, presyncope, or syncope.  The patient is otherwise without complaint today.   Past Medical History:  Diagnosis Date  . Acute superficial venous thrombosis of lower extremity    a. RLE after CABG, neg dopp for DVT.  [name] Allergy   . Atrial fibrillation ([place])    a. Post-op from CABG, on amiodarone temporarily, d/c'd 12/2011  . CAD (coronary artery disease)    a. S/P stenting to mid RCA, prox PDA 06/1999. b. NSTEMI/CABG x 3 in 10/2011 with LIMA to LAD, SVG to PDA, and SVG to OM1.   . Chronic UTI    a. Followed by Dr. Risa Grill - colonized/asymptomatic - not on abx  . CKD (chronic kidney disease), stage III (HCC)    a. stable with a creatinine around 1.9-2.0 followed by nephrology.  . Dyslipidemia   . GERD (gastroesophageal reflux disease)   . Hypertension    well-controlled.  . Myocardial infarction ([place])   . Neck injury    a. C3-C4 and C4-C5 foraminal narrowing, severe  . Prostate cancer ([place])    a. 2001 s/p TURP.  . Pulmonary nodule    a. felt to be noncancerous.  Status post followup CT scan 4 mm and stable.  . Renal artery stenosis ([place])    a. 50% by cath 2001  . 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 cardia catherization  07-07-99  . CARDIAC SURGERY  10/18/12   open heart surgery  . CATARACT EXTRACTION W/ INTRAOCULAR LENS  IMPLANT, BILATERAL  3/205, 06/2013   mccuen  . COLONOSCOPY  04/12/07  . CORONARY ARTERY BYPASS GRAFT  10/19/2011     Procedure: CORONARY ARTERY BYPASS GRAFTING (CABG);  Surgeon: Gaye Pollack, MD;  Location: [place];  Service: Open Heart Surgery;  Laterality: N/A;  times three using Left Internal Mammary Artery and Right Greater Saphenouse Vein Graft harvested Endoscopically  . edg  07-17-1994  . FLEXIBLE SIGMOIDOSCOPY  11-03-1997  . LEFT HEART CATHETERIZATION WITH CORONARY ANGIOGRAM N/A 10/16/2011   Procedure: LEFT HEART CATHETERIZATION WITH CORONARY ANGIOGRAM;  Surgeon: Peter M Martinique, MD;  Location: [facility] CATH LAB;  Service: Cardiovascular;  Laterality: N/A;  . LEFT HEART CATHETERIZATION WITH CORONARY/GRAFT ANGIOGRAM N/A 03/11/2013   Procedure: LEFT HEART CATHETERIZATION WITH [name];  Surgeon: Blane Ohara, MD;  Location: [facility] CATH LAB;  Service: Cardiovascular;  Laterality: N/A;  . lumbar spinal disk and neck fusion surgery    . PACEMAKER INSERTION  03/28/12   PPM implanted for mobitz II AV block  . peripheral vascular catherization  11-24-03  . PERMANENT PACEMAKER INSERTION N/A 03/28/2012   Procedure: PERMANENT PACEMAKER INSERTION;  Surgeon: Thompson Grayer, MD;  Location: [facility] CATH LAB;  Service: Cardiovascular;  Laterality: N/A;  . PROSTATECTOMY    . renal circulation  10-01-03  . s/p ptca    . stents     X 2  . stress cardiolite  05-04-05   spring 09-negative except  for apical thinning, EF 68%    ROS- all systems are reviewed and negative except as per HPI above  Current Outpatient Medications  Medication Sig Dispense Refill  . carboxymethylcellulose (REFRESH PLUS) 0.5 % SOLN Place 1 drop into both eyes daily as needed (dry eyes). 30 mL 1  . Cyanocobalamin (VITAMIN B-12 IJ) Inject 1 mL as directed every 30 (thirty) days.    [name] epoetin alfa-epbx (RETACRIT) 2000 UNIT/ML injection 2,000 Units 3 (three) times a week.    . ferumoxytol (FERAHEME) 510 MG/17ML SOLN injection Inject 510 mg into the vein once.    . folic acid (FOLVITE) 1 MG tablet TAKE 1 TABLET DAILY 90 tablet 3  . furosemide  (LASIX) 40 MG tablet Take 1 tablet (40 mg total) by mouth as needed for fluid or edema. 90 tablet 3  . metoprolol succinate (TOPROL-XL) 25 MG 24 hr tablet Take 1 tablet (25 mg total) by mouth at bedtime. 90 tablet 3  . Multiple Vitamins-Minerals (PRESERVISION AREDS 2 PO) Take 1 tablet by mouth daily.    . nitroGLYCERIN (NITROSTAT) 0.4 MG SL tablet DISSOLVE 1 TABLET UNDER THE TONGUE EVERY 5 MINUTES AS NEEDED FOR CHEST PAIN (MAX OF 3 TABLETS) 100 tablet 4  . omeprazole (PRILOSEC) 40 MG capsule Take 40 mg by mouth daily. Before breakfast    . simvastatin (ZOCOR) 20 MG tablet TAKE 1 TABLET DAILY (ANNUAL APPOINTMENT DUE IN [place]. MUST SEE DOCTOR FOR REFILLS) (Patient taking differently: On hold for now) 90 tablet 1  . triamcinolone cream (KENALOG) 0.1 % Apply 1 application topically 2 (two) times daily. 30 g 0  . vitamin C (ASCORBIC ACID) 500 MG tablet Take 1,000 mg by mouth daily.     . Vitamin D, Ergocalciferol, (DRISDOL) 1.25 MG (50000 UT) CAPS capsule Take 50,000 Units by mouth every 7 (seven) days.     No current facility-administered medications for this visit.     Physical Exam: Vitals:   03/17/18 1544  BP: (!) 170/76  Pulse: 82  SpO2: 99%  Weight: 158 lb 3.2 oz (71.8 kg)  Height: 5\' 11"  (1.803 m)    GEN- The patient is elderly appearing, alert and oriented x 3 today.   Head- normocephalic, atraumatic Eyes-  Sclera clear, conjunctiva pink Ears- hearing intact Oropharynx- clear Lungs- Clear to ausculation bilaterally, normal work of breathing Chest- pacemaker pocket is well healed Heart- Regular rate and rhythm, no murmurs, rubs or gallops, PMI not laterally displaced GI- soft, NT, ND, + BS Extremities- no clubbing, cyanosis, or edema  Pacemaker interrogation- reviewed in detail today,  See PACEART report  ekg tracing ordered today is personally reviewed and shows atrial paced 82 bpm, PR 276 msec, QRS 106 msec, Qtc 488 msec, anterior infarct pattern  Assessment and Plan:  1.  Symptomatic mobitz II heart block Normal pacemaker function See Pace Art report No changes today he is not device dependant today  2. CAD No ischemic symptoms No changes  3. HTN Stable No change required today  4. DDD He inquires about an MRI today.  He is aware that his device is not technically labeled as MRI conditional.  If absolutely necessary, we could probably arrange for an MRI through [facility] if needed.  His ordering physician should contact me if they need this arranged (pt aware)  carelink Return to see EP PA every year  Thompson Grayer MD, [facility] 03/17/2018 3:52 PM

## 2018-03-17 NOTE — Patient Instructions (Signed)
Medication Instructions:  Your physician recommends that you continue on your current medications as directed. Please refer to the Current Medication list given to you today.  Labwork: None ordered.  Testing/Procedures: None ordered.  Follow-Up: Your physician recommends that you schedule a follow-up appointment in:   One Year with Dr Rayann Heman  3 months device remote check   Any Other Special Instructions Will Be Listed Below (If Applicable).     If you need a refill on your cardiac medications before your next appointment, please call your pharmacy.

## 2018-03-18 ENCOUNTER — Encounter (HOSPITAL_COMMUNITY): Payer: Medicare Other

## 2018-03-18 ENCOUNTER — Telehealth: Payer: Self-pay

## 2018-03-18 LAB — CUP PACEART INCLINIC DEVICE CHECK
Battery Impedance: 378 Ohm
Battery Remaining Longevity: 104 mo
Battery Voltage: 2.79 V
Brady Statistic AP VP Percent: 0 %
Brady Statistic AP VS Percent: 39 %
Brady Statistic AS VP Percent: 0 %
Brady Statistic AS VS Percent: 60 %
Implantable Lead Implant Date: 20140321
Implantable Lead Location: 753859
Implantable Lead Location: 753860
Implantable Lead Model: 5076
Implantable Lead Model: 5092
Implantable Pulse Generator Implant Date: 20140321
Lead Channel Impedance Value: 412 Ohm
Lead Channel Impedance Value: 585 Ohm
Lead Channel Pacing Threshold Amplitude: 0.625 V
Lead Channel Pacing Threshold Amplitude: 0.625 V
Lead Channel Pacing Threshold Amplitude: 0.75 V
Lead Channel Pacing Threshold Amplitude: 0.75 V
Lead Channel Pacing Threshold Pulse Width: 0.4 ms
Lead Channel Pacing Threshold Pulse Width: 0.4 ms
Lead Channel Sensing Intrinsic Amplitude: 22.4 mV
Lead Channel Sensing Intrinsic Amplitude: 4 mV
Lead Channel Setting Pacing Amplitude: 2 V
Lead Channel Setting Pacing Amplitude: 2.5 V
Lead Channel Setting Pacing Pulse Width: 0.4 ms
Lead Channel Setting Sensing Sensitivity: 5.6 mV
MDC IDC LEAD IMPLANT DT: 20140321
MDC IDC MSMT LEADCHNL RA PACING THRESHOLD PULSEWIDTH: 0.4 ms
MDC IDC MSMT LEADCHNL RV PACING THRESHOLD PULSEWIDTH: 0.4 ms
MDC IDC SESS DTM: 20200309193017

## 2018-03-18 MED ORDER — CLONAZEPAM 0.25 MG PO TBDP: 0.2500 mg | ORAL_TABLET | Freq: Every day | ORAL | 1 refills | Status: DC

## 2018-03-18 NOTE — Telephone Encounter (Signed)
Called and advised Pt, called [facility] spoke with [employer]

## 2018-03-18 NOTE — Telephone Encounter (Signed)
-----   Message from Pieter Partridge, DO sent at 03/11/2018  3:59 PM EST ----- EEG is normal.  We can start clonazepam 0.25mg  at bedtime.  Caution for drowsiness.  If he should continue to have episodes of arm shaking, we can increase dose to twice daily.

## 2018-03-19 ENCOUNTER — Ambulatory Visit (HOSPITAL_COMMUNITY)
Admission: RE | Admit: 2018-03-19 | Discharge: 2018-03-19 | Disposition: A | Discharge status: Home / Self Care | Payer: Medicare Other | Source: Ambulatory Visit | Attending: Nephrology | Admitting: Nephrology

## 2018-03-19 ENCOUNTER — Other Ambulatory Visit: Payer: Self-pay

## 2018-03-19 VITALS — BP 145/71 | HR 78 | Temp 97.9°F | Resp 20

## 2018-03-19 DIAGNOSIS — N183 Chronic kidney disease, stage 3 unspecified: Secondary | ICD-10-CM

## 2018-03-19 DIAGNOSIS — Z79899 Other long term (current) drug therapy: Secondary | ICD-10-CM | POA: Insufficient documentation

## 2018-03-19 DIAGNOSIS — D631 Anemia in chronic kidney disease: Secondary | ICD-10-CM | POA: Diagnosis not present

## 2018-03-19 DIAGNOSIS — Z5181 Encounter for therapeutic drug level monitoring: Secondary | ICD-10-CM | POA: Insufficient documentation

## 2018-03-19 LAB — POCT HEMOGLOBIN-HEMACUE: Hemoglobin: 10.4 g/dL — ABNORMAL LOW (ref 13.0–17.0)

## 2018-03-19 LAB — IRON AND TIBC
IRON: 25 ug/dL — AB (ref 45–182)
Saturation Ratios: 10 % — ABNORMAL LOW (ref 17.9–39.5)
TIBC: 244 ug/dL — ABNORMAL LOW (ref 250–450)
UIBC: 219 ug/dL

## 2018-03-19 LAB — FERRITIN: Ferritin: 182 ng/mL (ref 24–336)

## 2018-03-19 MED ORDER — EPOETIN ALFA-EPBX 10000 UNIT/ML IJ SOLN
10000.0000 [IU] | INTRAMUSCULAR | Status: DC
  Administered 2018-03-19: 10000 [IU] via SUBCUTANEOUS
  Filled 2018-03-19: qty 1

## 2018-03-19 NOTE — Telephone Encounter (Signed)
Rcvd call from pharmacist I spoke with yesterday, Sushmi from [employer]. Lowest dose in clonazepam tablet is 0.5 mg. I advised ok to change to 0.5 mg and take 1/2 tab QHS, #15

## 2018-03-21 ENCOUNTER — Encounter: Payer: Medicare Other | Admitting: Internal Medicine

## 2018-03-25 ENCOUNTER — Other Ambulatory Visit: Payer: Self-pay

## 2018-03-25 ENCOUNTER — Encounter: Payer: Medicare Other | Admitting: Physician Assistant

## 2018-03-26 ENCOUNTER — Ambulatory Visit (HOSPITAL_COMMUNITY)
Admission: RE | Admit: 2018-03-26 | Discharge: 2018-03-26 | Disposition: A | Discharge status: Home / Self Care | Payer: Medicare Other | Source: Ambulatory Visit | Attending: Nephrology | Admitting: Nephrology

## 2018-03-26 VITALS — BP 161/83 | HR 70 | Temp 97.6°F | Resp 20

## 2018-03-26 DIAGNOSIS — N183 Chronic kidney disease, stage 3 unspecified: Secondary | ICD-10-CM

## 2018-03-26 LAB — POCT HEMOGLOBIN-HEMACUE: HEMOGLOBIN: 11.3 g/dL — AB (ref 13.0–17.0)

## 2018-03-26 MED ORDER — EPOETIN ALFA-EPBX 10000 UNIT/ML IJ SOLN
10000.0000 [IU] | INTRAMUSCULAR | Status: DC
  Administered 2018-03-26: 10000 [IU] via SUBCUTANEOUS
  Filled 2018-03-26: qty 1

## 2018-04-01 ENCOUNTER — Ambulatory Visit: Payer: Medicare Other

## 2018-04-02 ENCOUNTER — Encounter (HOSPITAL_COMMUNITY)
Admission: RE | Admit: 2018-04-02 | Discharge: 2018-04-02 | Disposition: A | Discharge status: Home / Self Care | Payer: Medicare Other | Source: Ambulatory Visit | Attending: Nephrology | Admitting: Nephrology

## 2018-04-02 ENCOUNTER — Other Ambulatory Visit: Payer: Self-pay

## 2018-04-02 VITALS — BP 131/79 | HR 92 | Temp 97.5°F | Resp 18

## 2018-04-02 DIAGNOSIS — N183 Chronic kidney disease, stage 3 unspecified: Secondary | ICD-10-CM

## 2018-04-02 LAB — POCT HEMOGLOBIN-HEMACUE: Hemoglobin: 11.6 g/dL — ABNORMAL LOW (ref 13.0–17.0)

## 2018-04-02 MED ORDER — EPOETIN ALFA-EPBX 10000 UNIT/ML IJ SOLN
10000.0000 [IU] | INTRAMUSCULAR | Status: DC
  Administered 2018-04-02: 10000 [IU] via SUBCUTANEOUS
  Filled 2018-04-02: qty 1

## 2018-04-07 ENCOUNTER — Telehealth: Payer: Self-pay | Admitting: Neurology

## 2018-04-07 NOTE — Telephone Encounter (Signed)
Clonazepam 0.5mg ; patient has some questions about if he should refill this medication and insurance. Please call him back at [phone]. Thanks!

## 2018-04-08 ENCOUNTER — Ambulatory Visit: Payer: Medicare Other

## 2018-04-08 ENCOUNTER — Other Ambulatory Visit: Payer: Self-pay

## 2018-04-08 NOTE — Telephone Encounter (Signed)
It they are infrequent, then he can discontinue it as the side effects (drowsiness, falls) may outweigh the benefits.  If he has another episode, he can try taking a dose.

## 2018-04-08 NOTE — Telephone Encounter (Signed)
Called and spoke with the Pt. He is questioniong if he needs to continuing the clonazepam. He has only ever had 2 episodes.

## 2018-04-08 NOTE — Telephone Encounter (Signed)
Called and advised Pt. He is agreeable.

## 2018-04-09 ENCOUNTER — Ambulatory Visit (HOSPITAL_COMMUNITY)
Admission: RE | Admit: 2018-04-09 | Discharge: 2018-04-09 | Disposition: A | Discharge status: Home / Self Care | Payer: Medicare Other | Source: Ambulatory Visit | Attending: Nephrology | Admitting: Nephrology

## 2018-04-09 VITALS — BP 152/72 | HR 86 | Temp 97.6°F | Resp 20

## 2018-04-09 DIAGNOSIS — N183 Chronic kidney disease, stage 3 unspecified: Secondary | ICD-10-CM

## 2018-04-09 LAB — POCT HEMOGLOBIN-HEMACUE: Hemoglobin: 11.5 g/dL — ABNORMAL LOW (ref 13.0–17.0)

## 2018-04-09 MED ORDER — EPOETIN ALFA-EPBX 10000 UNIT/ML IJ SOLN
10000.0000 [IU] | INTRAMUSCULAR | Status: DC
  Administered 2018-04-09: 10000 [IU] via SUBCUTANEOUS
  Filled 2018-04-09: qty 1

## 2018-04-15 ENCOUNTER — Ambulatory Visit (INDEPENDENT_AMBULATORY_CARE_PROVIDER_SITE_OTHER): Payer: Medicare Other | Admitting: *Deleted

## 2018-04-15 ENCOUNTER — Other Ambulatory Visit: Payer: Self-pay

## 2018-04-15 DIAGNOSIS — R001 Bradycardia, unspecified: Secondary | ICD-10-CM

## 2018-04-15 DIAGNOSIS — I441 Atrioventricular block, second degree: Secondary | ICD-10-CM

## 2018-04-15 LAB — CUP PACEART REMOTE DEVICE CHECK
Battery Impedance: 403 Ohm
Battery Remaining Longevity: 102 mo
Battery Voltage: 2.78 V
Brady Statistic AP VP Percent: 0 %
Brady Statistic AP VS Percent: 42 %
Brady Statistic AS VP Percent: 0 %
Brady Statistic AS VS Percent: 58 %
Date Time Interrogation Session: 20200407133619
Implantable Lead Implant Date: 20140321
Implantable Lead Implant Date: 20140321
Implantable Lead Location: 753859
Implantable Lead Location: 753860
Implantable Lead Model: 5076
Implantable Lead Model: 5092
Implantable Pulse Generator Implant Date: 20140321
Lead Channel Impedance Value: 412 Ohm
Lead Channel Impedance Value: 608 Ohm
Lead Channel Pacing Threshold Amplitude: 0.625 V
Lead Channel Pacing Threshold Amplitude: 0.75 V
Lead Channel Pacing Threshold Pulse Width: 0.4 ms
Lead Channel Pacing Threshold Pulse Width: 0.4 ms
Lead Channel Setting Pacing Amplitude: 2 V
Lead Channel Setting Pacing Amplitude: 2.5 V
Lead Channel Setting Pacing Pulse Width: 0.4 ms
Lead Channel Setting Sensing Sensitivity: 5.6 mV

## 2018-04-16 ENCOUNTER — Encounter (HOSPITAL_COMMUNITY)
Admission: RE | Admit: 2018-04-16 | Discharge: 2018-04-16 | Disposition: A | Discharge status: Home / Self Care | Payer: Medicare Other | Source: Ambulatory Visit | Attending: Nephrology | Admitting: Nephrology

## 2018-04-16 ENCOUNTER — Other Ambulatory Visit: Payer: Self-pay

## 2018-04-16 VITALS — BP 179/87 | HR 91 | Temp 97.7°F | Resp 20

## 2018-04-16 DIAGNOSIS — D631 Anemia in chronic kidney disease: Secondary | ICD-10-CM | POA: Insufficient documentation

## 2018-04-16 DIAGNOSIS — N183 Chronic kidney disease, stage 3 unspecified: Secondary | ICD-10-CM

## 2018-04-16 LAB — IRON AND TIBC
Iron: 25 ug/dL — ABNORMAL LOW (ref 45–182)
Saturation Ratios: 9 % — ABNORMAL LOW (ref 17.9–39.5)
TIBC: 267 ug/dL (ref 250–450)
UIBC: 242 ug/dL

## 2018-04-16 LAB — FERRITIN: Ferritin: 38 ng/mL (ref 24–336)

## 2018-04-16 LAB — POCT HEMOGLOBIN-HEMACUE: Hemoglobin: 11.3 g/dL — ABNORMAL LOW (ref 13.0–17.0)

## 2018-04-16 MED ORDER — EPOETIN ALFA-EPBX 10000 UNIT/ML IJ SOLN
20000.0000 [IU] | INTRAMUSCULAR | Status: DC
  Administered 2018-04-16: 20000 [IU] via SUBCUTANEOUS
  Filled 2018-04-16: qty 2

## 2018-04-19 ENCOUNTER — Other Ambulatory Visit: Payer: Self-pay | Admitting: Cardiovascular Disease

## 2018-04-23 NOTE — Progress Notes (Signed)
Remote pacemaker transmission.   

## 2018-04-30 ENCOUNTER — Telehealth: Payer: Self-pay | Admitting: Cardiovascular Disease

## 2018-04-30 ENCOUNTER — Other Ambulatory Visit: Payer: Self-pay

## 2018-04-30 ENCOUNTER — Ambulatory Visit (HOSPITAL_COMMUNITY)
Admission: RE | Admit: 2018-04-30 | Discharge: 2018-04-30 | Disposition: A | Discharge status: Home / Self Care | Payer: Medicare Other | Source: Ambulatory Visit | Attending: Nephrology | Admitting: Nephrology

## 2018-04-30 ENCOUNTER — Telehealth: Payer: Self-pay | Admitting: Internal Medicine

## 2018-04-30 VITALS — BP 181/70 | HR 70 | Temp 98.2°F | Resp 20

## 2018-04-30 DIAGNOSIS — N183 Chronic kidney disease, stage 3 unspecified: Secondary | ICD-10-CM

## 2018-04-30 LAB — POCT HEMOGLOBIN-HEMACUE: Hemoglobin: 11.1 g/dL — ABNORMAL LOW (ref 13.0–17.0)

## 2018-04-30 MED ORDER — EPOETIN ALFA-EPBX 10000 UNIT/ML IJ SOLN
20000.0000 [IU] | INTRAMUSCULAR | Status: DC
  Administered 2018-04-30: 20000 [IU] via SUBCUTANEOUS
  Filled 2018-04-30: qty 2

## 2018-04-30 NOTE — Telephone Encounter (Signed)
Pt c/o BP issue: STAT if pt c/o blurred vision, one-sided weakness or slurred speech  1. What are your last 5 BP readings? Today 181/79 pulse 68  2. Are you having any other symptoms (ex. Dizziness, headache, blurred vision, passed out)? No   3. What is your BP issue? Going up.

## 2018-04-30 NOTE — Telephone Encounter (Signed)
Rec'd from [facility] PA forwarded 5 pages to Dr. [name]

## 2018-05-02 ENCOUNTER — Telehealth: Payer: Self-pay | Admitting: *Deleted

## 2018-05-02 NOTE — Telephone Encounter (Signed)
Ok.  BP is borderline. Continue to monitor >> check once a day. Ideally, if he could check BP daily, write it down and then send those readings to [place], we can review. If he is unable to do that, he should call us if BP consistently > 140/90. Richardson Dopp, PA-C    05/02/2018 1:43 PM

## 2018-05-02 NOTE — Telephone Encounter (Signed)
SPOKE WITH PT TO INQUIRE ON BP ANDS ANY READING PT MAY HAVE KEPT ON LOG   PT STATED THE TIME OF BP TAKEN  WAS AN OV FOR HCG INJECTION  AT HOSPITAL AND HE HAD BEEN WALKING A LOT AND WASN'T RELAXED LIKE AT HOME WHEN HE TAKE HIS PRESSURE.  HE HAD TOLD THE NURSE HE WOULD CONTACT CARDIOLOGY AND LET us KNOW BUT HIS PRESSURE HAVE GOTTEN MUCH BETTER SINE THEN TWO DAYS AGO  PT TAKING CURRENT MEDICATIONS AT CURRENT DOSES OR AS NEEDED AD INSTRUCTED AND NO C/O OF CP SOB DIZZY SPELLS  SWELLING OF ANY SORT IN LIMBS   CURRENT READINGS   4/23  8 am 140/70   4/24  7:30-7:45 am 144/80  PT WAS TOLD PROVIDER WOULD BE  UPDATED ON STATUS AND CALL BACK WITH ANY RECOMMENDATIONS IF ANY

## 2018-05-12 ENCOUNTER — Ambulatory Visit (INDEPENDENT_AMBULATORY_CARE_PROVIDER_SITE_OTHER): Payer: Medicare Other | Admitting: Internal Medicine

## 2018-05-12 ENCOUNTER — Other Ambulatory Visit: Payer: Self-pay

## 2018-05-12 ENCOUNTER — Telehealth: Payer: Self-pay | Admitting: Internal Medicine

## 2018-05-12 ENCOUNTER — Encounter: Payer: Self-pay | Admitting: Internal Medicine

## 2018-05-12 ENCOUNTER — Other Ambulatory Visit (INDEPENDENT_AMBULATORY_CARE_PROVIDER_SITE_OTHER): Payer: Medicare Other

## 2018-05-12 VITALS — BP 162/84 | HR 65 | Temp 98.8°F | Resp 16 | Ht 71.0 in | Wt 155.8 lb

## 2018-05-12 DIAGNOSIS — G6289 Other specified polyneuropathies: Secondary | ICD-10-CM

## 2018-05-12 DIAGNOSIS — M10379 Gout due to renal impairment, unspecified ankle and foot: Secondary | ICD-10-CM | POA: Diagnosis not present

## 2018-05-12 DIAGNOSIS — R748 Abnormal levels of other serum enzymes: Secondary | ICD-10-CM

## 2018-05-12 DIAGNOSIS — M109 Gout, unspecified: Secondary | ICD-10-CM

## 2018-05-12 DIAGNOSIS — I25119 Atherosclerotic heart disease of native coronary artery with unspecified angina pectoris: Secondary | ICD-10-CM | POA: Diagnosis not present

## 2018-05-12 HISTORY — DX: Gout, unspecified: M10.9

## 2018-05-12 LAB — COMPREHENSIVE METABOLIC PANEL
ALT: 14 U/L (ref 0–53)
AST: 26 U/L (ref 0–37)
Albumin: 4 g/dL (ref 3.5–5.2)
Alkaline Phosphatase: 80 U/L (ref 39–117)
BUN: 31 mg/dL — ABNORMAL HIGH (ref 6–23)
CO2: 23 mEq/L (ref 19–32)
Calcium: 8.9 mg/dL (ref 8.4–10.5)
Chloride: 103 mEq/L (ref 96–112)
Creatinine, Ser: 1.77 mg/dL — ABNORMAL HIGH (ref 0.40–1.50)
GFR: 36.34 mL/min — ABNORMAL LOW (ref >60.00)
Glucose, Bld: 82 mg/dL (ref 70–99)
Potassium: 4.1 mEq/L (ref 3.5–5.1)
Sodium: 136 mEq/L (ref 135–145)
Total Bilirubin: 0.7 mg/dL (ref 0.2–1.2)
Total Protein: 7.8 g/dL (ref 6.0–8.3)

## 2018-05-12 LAB — SEDIMENTATION RATE: Sed Rate: 47 mm/hr — ABNORMAL HIGH (ref 0–20)

## 2018-05-12 LAB — CK: Total CK: 258 U/L — ABNORMAL HIGH (ref 7–232)

## 2018-05-12 LAB — URIC ACID: Uric Acid, Serum: 8 mg/dL — ABNORMAL HIGH (ref 4.0–7.8)

## 2018-05-12 MED ORDER — GABAPENTIN 100 MG PO CAPS: 100.0000 mg | ORAL_CAPSULE | Freq: Every day | ORAL | 3 refills | Status: DC

## 2018-05-12 MED ORDER — PREDNISONE 10 MG PO TABS: ORAL_TABLET | ORAL | 0 refills | Status: DC

## 2018-05-12 MED ORDER — ALLOPURINOL 100 MG PO TABS: 100.0000 mg | ORAL_TABLET | Freq: Every day | ORAL | 6 refills | Status: DC

## 2018-05-12 NOTE — Progress Notes (Signed)
Subjective:    Patient ID: [name], male    DOB: Jan 27, 1926, 85 y.o.   MRN: [id number]  HPI The patient is here for an acute visit for gout.  In the last 3-4 days the electric feeling on the bottom of both feet has increased.  This is not a new feeling-it is just gotten worse.  He wonders if anything would help this.  The right first toe is red and swollen and the left first toe is getting red and swollen.  The right toe started in March and has not gone away - it never got better.  He was given prednisone and that did help.  He was also given a boot by sports medicine and that helped for short period of time as well.  His bilateral first toes are very sensitive -he even has pain when his sheets at night touch the toes.  Bending his toes causes significant pain.  He feels this is gout.     He recently saw Dr. Amil Amen via virtual visit, but is not experiencing this degree of pain when he saw him.  He is still experiencing back pain and pain into his upper thighs.  He had talked about doing blood work, but did not want to have him come into the office to get it done.[name]  He is unsure if his CK is still elevated or not.  He is still not taking the statin.   Medications and allergies reviewed with patient and updated if appropriate.  Patient Active Problem List   Diagnosis Date Noted  . Myoclonic jerking 02/10/2018  . Chronic diastolic CHF (congestive heart failure) ([place]) 02/05/2018  . Pain and swelling of toe of right foot 02/01/2018  . UGIB (upper gastrointestinal bleed) 02/01/2018  . Hyperpigmentation 11/04/2017  . Abnormal appearance of skin 10/07/2017  . Frequent urination 09/03/2017  . Gouty arthritis of right great toe 09/03/2017  . Postnasal drip 08/06/2017  . Rash and nonspecific skin eruption 08/06/2017  . Moderate aortic regurgitation 07/04/2017  . Diastolic dysfunction 36/64/4034  . Edema 06/29/2017  . DOE (dyspnea on exertion) 06/29/2017  . Sinus symptom  06/05/2017  . Fatigue 05/18/2017  . Sleeping difficulty 05/18/2017  . Foot pain, bilateral 04/22/2017  . Trochanteric bursitis 01/10/2017  . Internal hemorrhoids 08/15/2016  . Dysphagia 02/06/2016  . Cephalalgia 07/14/2015  . Constipation 03/01/2015  . Myalgia 03/02/2014  . CHB (complete heart block) ([place]) 05/03/2012  . Postoperative atrial fibrillation ([place]) 05/03/2012  . Pacemaker 04/10/2012  . 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 Coronary atherosclerosis 11/27/2011  . Presence of aortocoronary bypass graft 10/24/2011  . Pulmonary nodule   . CAD (coronary artery disease)   . CKD (chronic kidney disease) stage 3, GFR 30-59 ml/min (HCC)   . Venous insufficiency of leg 06/05/2010  . SHOULDER PAIN, RIGHT, CHRONIC 10/14/2009  . B12 deficiency 08/23/2009  . Peripheral neuropathy 03/21/2009  . DEGENERATIVE DISC DISEASE, CERVICAL SPINE, W/RADICULOPATHY 09/21/2008  . Dyslipidemia 01/17/2007  . Essential hypertension 07/20/2006  . Prostate cancer ([place]) 07/20/2006    Current Outpatient Medications on File Prior to Visit  Medication Sig Dispense Refill  . carboxymethylcellulose (REFRESH PLUS) 0.5 % SOLN Place 1 drop into both eyes daily as needed (dry eyes). 30 mL 1  . clonazePAM (KLONOPIN) 0.25 MG disintegrating tablet Take 1 tablet (0.25 mg total) by mouth at bedtime. 30 tablet 1  . Cyanocobalamin (VITAMIN B-12 IJ) Inject 1 mL as directed every 30 (thirty)  days.    . epoetin alfa-epbx (RETACRIT) 2000 UNIT/ML injection 2,000 Units 3 (three) times a week.    . ferumoxytol (FERAHEME) 510 MG/17ML SOLN injection Inject 510 mg into the vein once.    . folic acid (FOLVITE) 1 MG tablet TAKE 1 TABLET DAILY 90 tablet 3  . Multiple Vitamins-Minerals (PRESERVISION AREDS 2 PO) Take 1 tablet by mouth daily.    . nitroGLYCERIN (NITROSTAT) 0.4 MG SL tablet DISSOLVE 1 TABLET UNDER THE TONGUE EVERY 5 MINUTES AS NEEDED FOR CHEST PAIN (MAX OF 3 TABLETS) 100 tablet 4  . omeprazole  (PRILOSEC) 40 MG capsule Take 40 mg by mouth daily. Before breakfast    . TOPROL XL 25 MG 24 hr tablet TAKE 1 TABLET AT BEDTIME (DOSE DECREASE) 90 tablet 3  . triamcinolone cream (KENALOG) 0.1 % Apply 1 application topically 2 (two) times daily. 30 g 0  . vitamin C (ASCORBIC ACID) 500 MG tablet Take 1,000 mg by mouth daily.     . Vitamin D, Ergocalciferol, (DRISDOL) 1.25 MG (50000 UT) CAPS capsule Take 50,000 Units by mouth every 7 (seven) days.    . furosemide (LASIX) 40 MG tablet Take 1 tablet (40 mg total) by mouth as needed for fluid or edema. 90 tablet 3  . simvastatin (ZOCOR) 20 MG tablet TAKE 1 TABLET DAILY (ANNUAL APPOINTMENT DUE IN [place]. MUST SEE DOCTOR FOR REFILLS) (Patient not taking: Reported on 05/12/2018) 90 tablet 1   No current facility-administered medications on file prior to visit.     Past Medical History:  Diagnosis Date  . Acute superficial venous thrombosis of lower extremity    a. RLE after CABG, neg dopp for DVT.  [name] Allergy   . Atrial fibrillation ([place])    a. Post-op from CABG, on amiodarone temporarily, d/c'd 12/2011  . CAD (coronary artery disease)    a. S/P stenting to mid RCA, prox PDA 06/1999. b. NSTEMI/CABG x 3 in 10/2011 with LIMA to LAD, SVG to PDA, and SVG to OM1.   . Chronic UTI    a. Followed by Dr. Risa Grill - colonized/asymptomatic - not on abx  . CKD (chronic kidney disease), stage III (HCC)    a. stable with a creatinine around 1.9-2.0 followed by nephrology.  . Dyslipidemia   . GERD (gastroesophageal reflux disease)   . Hypertension    well-controlled.  . Myocardial infarction ([place])   . Neck injury    a. C3-C4 and C4-C5 foraminal narrowing, severe  . Prostate cancer ([place])    a. 2001 s/p TURP.  . Pulmonary nodule    a. felt to be noncancerous.  Status post followup CT scan 4 mm and stable.  . Renal artery stenosis ([place])    a. 50% by cath 2001  . 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 cardia catherization  07-07-99  . CARDIAC SURGERY  10/18/12   open heart surgery  . CATARACT EXTRACTION W/ INTRAOCULAR LENS  IMPLANT, BILATERAL  3/205, 06/2013   mccuen  . COLONOSCOPY  04/12/07  . CORONARY ARTERY BYPASS GRAFT  10/19/2011   Procedure: CORONARY ARTERY BYPASS GRAFTING (CABG);  Surgeon: Gaye Pollack, MD;  Location: [place];  Service: Open Heart Surgery;  Laterality: N/A;  times three using Left Internal Mammary Artery and Right Greater Saphenouse Vein Graft harvested Endoscopically  . edg  07-17-1994  . FLEXIBLE SIGMOIDOSCOPY  11-03-1997  . LEFT HEART  CATHETERIZATION WITH CORONARY ANGIOGRAM N/A 10/16/2011   Procedure: LEFT HEART CATHETERIZATION WITH CORONARY ANGIOGRAM;  Surgeon: Peter M Martinique, MD;  Location: [facility] CATH LAB;  Service: Cardiovascular;  Laterality: N/A;  . LEFT HEART CATHETERIZATION WITH CORONARY/GRAFT ANGIOGRAM N/A 03/11/2013   Procedure: LEFT HEART CATHETERIZATION WITH [name];  Surgeon: Blane Ohara, MD;  Location: [facility] CATH LAB;  Service: Cardiovascular;  Laterality: N/A;  . lumbar spinal disk and neck fusion surgery    . PACEMAKER INSERTION  03/28/12   PPM implanted for mobitz II AV block  . peripheral vascular catherization  11-24-03  . PERMANENT PACEMAKER INSERTION N/A 03/28/2012   Procedure: PERMANENT PACEMAKER INSERTION;  Surgeon: Thompson Grayer, MD;  Location: [facility] CATH LAB;  Service: Cardiovascular;  Laterality: N/A;  . PROSTATECTOMY    . renal circulation  10-01-03  . s/p ptca    . stents     X 2  . stress cardiolite  05-04-05   spring 09-negative except for apical thinning, EF 68%    Social History   Socioeconomic History  . Marital status: Married    Spouse name: [name]  . Number of children: 2  . Years of education: Not on file  . Highest education level: Not on file  Occupational History  . 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 Financial resource strain: Not on file  . Food insecurity:    Worry: Not on file    Inability: Not on file  . Transportation needs:    Medical: Not on file    Non-medical: Not on file  Tobacco Use  . Smoking status: Never Smoker  . Smokeless tobacco: Never Used  Substance and Sexual Activity  . Alcohol use: Yes    Comment: Rarely  . Drug use: No  . Sexual activity: Not on file  Lifestyle  . Physical activity:    Days per week: Not on file    Minutes per session: Not on file  . Stress: Not on file  Relationships  . Social connections:    Talks on phone: Not on file    Gets together: Not on file    Attends religious service: Not on file    Active member of club or organization: Not on file    Attends meetings of clubs or organizations: Not on file    Relationship status: Not on file  Other Topics Concern  . Not on file  Social History Narrative   HSG, 1 year college.  married '52 - 3 years, divorced; married '56 - 3 years divorced; married '44-12 yrs - divorced; married '75 -. 1 son '57; 1 daughter - '53; 1 grandchild.  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  Very happily married.  End of life care: yes CPR, no long term mechanical ventilation, no heroic measures.    Family History  Problem Relation Age of Onset  . Coronary artery disease Father        died @ 84  . Other Mother        cerebral hemorrhage - died @ 41  . Arthritis Mother   . Cancer Brother        Bladder  . Prostate cancer Brother   . Nephritis Brother        died @ age  6.  . Other Brother        cerebral hemorrhage - died @ 70  . Heart disease Brother   . Breast cancer Other        niece x 2  . Diabetes Neg Hx   . Colon cancer Neg Hx   . Adrenal disorder Neg Hx     Review of Systems  Constitutional: Positive for chills. Negative for fever.  Respiratory:  Positive for shortness of breath (Only with moderate exertion). Negative for cough and wheezing.   Cardiovascular: Positive for leg swelling (Minimal swelling). Negative for chest pain and palpitations.  Skin: Positive for color change. Negative for wound.       Objective:   Vitals:   05/12/18 1256  BP: (!) 162/84  Pulse: 65  Resp: 16  Temp: 98.8 F (37.1 C)  SpO2: 98%   BP Readings from Last 3 Encounters:  05/12/18 (!) 162/84  04/30/18 (!) 181/70  04/16/18 (!) 179/87   Wt Readings from Last 3 Encounters:  05/12/18 155 lb 12.8 oz (70.7 kg)  03/17/18 158 lb 3.2 oz (71.8 kg)  03/11/18 157 lb (71.2 kg)   Body mass index is 21.73 kg/m.   Physical Exam Constitutional:      General: He is not in acute distress.    Appearance: Normal appearance. He is not ill-appearing.  HENT:     Head: Normocephalic and atraumatic.  Musculoskeletal:     Right lower leg: Edema (1+ dorsal foot edema) present.     Left lower leg: Edema (1+ dorsal foot edema) present.     Comments: Mild swelling, warmth and erythema bilateral first MTP joints.  Increased pain with active and passive movement  Skin:    General: Skin is warm and dry.     Findings: Erythema (Bilateral first toes-MTP joint  L > R) present. No lesion.  Neurological:     Mental Status: He is alert.            Assessment & Plan:    See Problem List for Assessment and Plan of chronic medical problems.

## 2018-05-12 NOTE — Telephone Encounter (Signed)
Appointment made

## 2018-05-12 NOTE — Assessment & Plan Note (Addendum)
Bilateral first toes Will check uric acid, cmp, esr Prednisone taper Allopurinol 100 mg daily He will call if there is no improvement or complete resolution of the gout May be able to increase allopurinol if tolerated and depending on kidney function

## 2018-05-12 NOTE — Assessment & Plan Note (Signed)
CK was elevated, and one-point aldolase was also elevated Stopped statin ?  Still elevated Following with Dr. Amil Amen Since we are getting blood work will check CK

## 2018-05-12 NOTE — Patient Instructions (Signed)
  Tests ordered today. Your results will be released to [place] (or called to you) after review, usually within 72hours after test completion. If any changes need to be made, you will be notified at that same time.   Medications reviewed and updated.  Changes include :   Start allopurinol daily for gout prevention.  Take the prednisone taper for treatment of the gout.    Start gabapentin about one week from now - this is for the nerve pain in your feet.   Your prescription(s) have been submitted to your pharmacy. Please take as directed and contact our office if you believe you are having problem(s) with the medication(s).

## 2018-05-12 NOTE — Assessment & Plan Note (Signed)
Has electric feeling in both feet-it has been worse recently Wonders if there is a medication that will help Trial of gabapentin 100 mg nightly-discussed possible side effects and that we can titrate dose if tolerated

## 2018-05-12 NOTE — Telephone Encounter (Signed)
Patient requesting in office appt for gout in both feet.  Patient states he could do a virtual visit but prefers to be seen in the office by Dr. Quay Burow.  Please advise.

## 2018-05-13 ENCOUNTER — Telehealth: Payer: Self-pay | Admitting: *Deleted

## 2018-05-13 ENCOUNTER — Telehealth: Payer: Self-pay | Admitting: Cardiovascular Disease

## 2018-05-13 ENCOUNTER — Other Ambulatory Visit: Payer: Self-pay

## 2018-05-13 ENCOUNTER — Encounter: Payer: Self-pay | Admitting: Internal Medicine

## 2018-05-13 NOTE — Telephone Encounter (Signed)
Follow up  ° ° °Pt is returning call  ° ° °Please call back  °

## 2018-05-13 NOTE — Telephone Encounter (Signed)
F/U Message           Patient is returning Shana'c call

## 2018-05-13 NOTE — Telephone Encounter (Signed)
SPOKE WITH PT WHO WAS AT EYE DR OFFICE FOR EYE INJECTIONS AND STATED WILL CALL BACK   WITH MORE INFORMATION AND READINGS AS WE DISCUSSED TO KEEP RECORD OF

## 2018-05-13 NOTE — Telephone Encounter (Signed)
New Message:    Patient saw his primary doctor informed him that his BP his going up and down. Would like patient to see doctor soon. Please call patient.

## 2018-05-13 NOTE — Telephone Encounter (Signed)
LVMOM TO CALL BACK

## 2018-05-13 NOTE — Telephone Encounter (Signed)
Start Norvasc (Amlodipine) 2.5 mg once daily. Continue to monitor blood pressure and send readings after taking Amlodipine for 1 month. Richardson Dopp, PA-C    05/13/2018 4:59 PM

## 2018-05-13 NOTE — Telephone Encounter (Signed)
SPOKE WITH PT WHO HAVING CONCERNS ABOUT BLOOD PRESSURE READING AND HAS BEEN KEEPING A LOG AS DISCUSSED PRIOR.  PT SAW PCP 5-4 IN EPIC  LABS DRAWN NOTES IN Epic MYCHART  MESSAGE FROM PCP  TIME FRAMES BP OBTAINED   ARE BETWEEN  8 am to 9:15 am   TAKES TOPROL XL 25 MG IN THE EVENING    4-24 144/80  4-25 153/70  4-26 NO READING 4-27 147 /70  4-28 167/78 4-27 158/67  4-30  148/67  5-1 144/69  5-2  161/74  5-3  139/64  5-4 137/61   5-4  OV 1 pm 162/84 DR APPT STACEY BURNS IN Epic  5-6 141/72   PT WAS TOLD READINGS ARE SENT TO PROVIDER FOR FURTHER RECOMMENDATIONS. AND WILL CONTACT BACK WITH RECOMMENDATIONS

## 2018-05-14 ENCOUNTER — Ambulatory Visit (HOSPITAL_COMMUNITY)
Admission: RE | Admit: 2018-05-14 | Discharge: 2018-05-14 | Disposition: A | Discharge status: Home / Self Care | Payer: Medicare Other | Source: Ambulatory Visit | Attending: Nephrology | Admitting: Nephrology

## 2018-05-14 ENCOUNTER — Telehealth: Payer: Self-pay | Admitting: *Deleted

## 2018-05-14 VITALS — BP 157/83 | HR 89 | Temp 97.7°F

## 2018-05-14 DIAGNOSIS — N183 Chronic kidney disease, stage 3 unspecified: Secondary | ICD-10-CM

## 2018-05-14 DIAGNOSIS — D631 Anemia in chronic kidney disease: Secondary | ICD-10-CM | POA: Insufficient documentation

## 2018-05-14 LAB — IRON AND TIBC
Iron: 26 ug/dL — ABNORMAL LOW (ref 45–182)
Saturation Ratios: 8 % — ABNORMAL LOW (ref 17.9–39.5)
TIBC: 342 ug/dL (ref 250–450)
UIBC: 316 ug/dL

## 2018-05-14 LAB — POCT HEMOGLOBIN-HEMACUE: Hemoglobin: 11.5 g/dL — ABNORMAL LOW (ref 13.0–17.0)

## 2018-05-14 LAB — FERRITIN: Ferritin: 33 ng/mL (ref 24–336)

## 2018-05-14 MED ORDER — AMLODIPINE BESYLATE 2.5 MG PO TABS: 2.5000 mg | ORAL_TABLET | Freq: Every day | ORAL | 0 refills | Status: DC

## 2018-05-14 MED ORDER — EPOETIN ALFA-EPBX 10000 UNIT/ML IJ SOLN
20000.0000 [IU] | INTRAMUSCULAR | Status: DC
  Administered 2018-05-14: 20000 [IU] via SUBCUTANEOUS
  Filled 2018-05-14: qty 2

## 2018-05-14 NOTE — Telephone Encounter (Signed)
SPOKE WITH PT AND PT AWARE OF START TAKING AMLODIPINE 2.5 DAILY CALL us BACK IN A MONTH WITH READINGS TO SEE HOW ITS WORKING   PT UNDERSTOOD AND WILL TAKE BO LOG AND  MEDS RX SENT IN TO LOCAL PHARMACY 30 DAY NO RF

## 2018-05-15 ENCOUNTER — Ambulatory Visit (INDEPENDENT_AMBULATORY_CARE_PROVIDER_SITE_OTHER): Payer: Medicare Other

## 2018-05-15 ENCOUNTER — Other Ambulatory Visit: Payer: Self-pay

## 2018-05-15 DIAGNOSIS — E538 Deficiency of other specified B group vitamins: Secondary | ICD-10-CM | POA: Diagnosis not present

## 2018-05-15 MED ORDER — CYANOCOBALAMIN 1000 MCG/ML IJ SOLN
1000.0000 ug | Freq: Once | INTRAMUSCULAR | Status: AC
  Administered 2018-05-15: 1000 ug via INTRAMUSCULAR

## 2018-05-15 NOTE — Progress Notes (Signed)
b12 Injection given.   Robbin Escher J Graciano Batson, MD  

## 2018-05-26 ENCOUNTER — Telehealth: Payer: Self-pay

## 2018-05-26 NOTE — Telephone Encounter (Signed)
Okay to discontinue the medication-he can just stop taking it.

## 2018-05-26 NOTE — Telephone Encounter (Signed)
Pt aware.

## 2018-05-26 NOTE — Telephone Encounter (Signed)
Copied from [facility] [id number]. Topic: General - Inquiry >> May 26, 2018  8:38 AM Scherrie Gerlach wrote: Reason for CRM: pt states he think the medicine gabapentin (NEURONTIN) 100 MG capsule is causing him to have pimples on his tongue. At certain time it really hurts, and when he eats certain things, wakes him up at night.  Pt states he had been on the other 2, prednisone and allopurinol (ZYLOPRIM) 100 MG tablet several dayas and had no effect, until he began the gabapentin. Pt states he will not take this anymore unless the dr says ok

## 2018-05-28 ENCOUNTER — Other Ambulatory Visit: Payer: Self-pay

## 2018-05-28 ENCOUNTER — Encounter (HOSPITAL_COMMUNITY)
Admission: RE | Admit: 2018-05-28 | Discharge: 2018-05-28 | Disposition: A | Discharge status: Home / Self Care | Payer: Medicare Other | Source: Ambulatory Visit | Attending: Nephrology | Admitting: Nephrology

## 2018-05-28 VITALS — BP 151/69 | HR 73 | Temp 97.9°F | Resp 20

## 2018-05-28 DIAGNOSIS — N183 Chronic kidney disease, stage 3 unspecified: Secondary | ICD-10-CM

## 2018-05-28 LAB — POCT HEMOGLOBIN-HEMACUE: Hemoglobin: 11.2 g/dL — ABNORMAL LOW (ref 13.0–17.0)

## 2018-05-28 MED ORDER — EPOETIN ALFA-EPBX 10000 UNIT/ML IJ SOLN
20000.0000 [IU] | INTRAMUSCULAR | Status: DC
  Administered 2018-05-28: 20000 [IU] via SUBCUTANEOUS
  Filled 2018-05-28: qty 2

## 2018-06-11 ENCOUNTER — Other Ambulatory Visit: Payer: Self-pay

## 2018-06-11 ENCOUNTER — Ambulatory Visit (HOSPITAL_COMMUNITY)
Admission: RE | Admit: 2018-06-11 | Discharge: 2018-06-11 | Disposition: A | Discharge status: Home / Self Care | Payer: Medicare Other | Source: Ambulatory Visit | Attending: Nephrology | Admitting: Nephrology

## 2018-06-11 VITALS — BP 141/71 | HR 71 | Temp 97.5°F | Resp 20

## 2018-06-11 DIAGNOSIS — D631 Anemia in chronic kidney disease: Secondary | ICD-10-CM | POA: Insufficient documentation

## 2018-06-11 DIAGNOSIS — N183 Chronic kidney disease, stage 3 unspecified: Secondary | ICD-10-CM

## 2018-06-11 LAB — IRON AND TIBC
Iron: 25 ug/dL — ABNORMAL LOW (ref 45–182)
Saturation Ratios: 7 % — ABNORMAL LOW (ref 17.9–39.5)
TIBC: 354 ug/dL (ref 250–450)
UIBC: 329 ug/dL

## 2018-06-11 LAB — FERRITIN: Ferritin: 19 ng/mL — ABNORMAL LOW (ref 24–336)

## 2018-06-11 LAB — POCT HEMOGLOBIN-HEMACUE: Hemoglobin: 11.3 g/dL — ABNORMAL LOW (ref 13.0–17.0)

## 2018-06-11 MED ORDER — EPOETIN ALFA-EPBX 10000 UNIT/ML IJ SOLN
20000.0000 [IU] | INTRAMUSCULAR | Status: DC
  Administered 2018-06-11: 20000 [IU] via SUBCUTANEOUS
  Filled 2018-06-11: qty 2

## 2018-06-13 ENCOUNTER — Telehealth: Payer: Self-pay | Admitting: Neurology

## 2018-06-13 NOTE — Telephone Encounter (Signed)
Pls call pt, he states that his right hand has been shaking recently, he also has other issues that would like to report.

## 2018-06-13 NOTE — Telephone Encounter (Signed)
Dr. Tomi Likens,  Would you like to schedule an appt for this pt?

## 2018-06-15 NOTE — Telephone Encounter (Signed)
Yes, he can make an appointment.

## 2018-06-16 ENCOUNTER — Ambulatory Visit: Payer: Medicare Other | Admitting: Neurology

## 2018-06-16 ENCOUNTER — Telehealth: Payer: Self-pay | Admitting: Cardiovascular Disease

## 2018-06-16 MED ORDER — AMLODIPINE BESYLATE 2.5 MG PO TABS: 2.5000 mg | ORAL_TABLET | Freq: Every day | ORAL | 3 refills | Status: DC

## 2018-06-16 MED ORDER — AMLODIPINE BESYLATE 2.5 MG PO TABS: 2.5000 mg | ORAL_TABLET | Freq: Every day | ORAL | 0 refills | Status: DC

## 2018-06-16 NOTE — Telephone Encounter (Signed)
The patient called to give BP readings and to see if he should continue amlodipine 2.5 mg daily.  He dropped off readings this morning. The readings range from 109/59 to 154/73 with an average BP of 133/65. Instructed him to continue taking amlodipine. He will call if his systolic BP is consistently over 140.  He also has concerns about unexplained weight loss since October 2019. His weight log from today showed on May 7 he weighed 149.5 lbs. 6/5 he weighed 146.5. he had taken 5 days of lasix the week prior to the final weigh in due to swelling. He no longer has swelling. He does report he has lost about 20 pounds since October though and doesn't know why. He has the same appetite and is eating. He states he has an appointment with his PCP next week. Encouraged him to discuss this unintentional weight loss with her as further investigating may be warranted. He was grateful for assistance.

## 2018-06-16 NOTE — Telephone Encounter (Signed)
evisit is fine

## 2018-06-16 NOTE — Telephone Encounter (Signed)
In the office

## 2018-06-16 NOTE — Telephone Encounter (Signed)
Spoke with patient and virtual visit scheduled.

## 2018-06-16 NOTE — Telephone Encounter (Signed)
New Message     Pt says he sent in his BP readings for the last Month Pt says is he is suppose to keep taking the  Amlodipine, he will need more medication.  [facility] and a 2 weeks to [facility] on [address]      Please call

## 2018-06-17 ENCOUNTER — Ambulatory Visit (INDEPENDENT_AMBULATORY_CARE_PROVIDER_SITE_OTHER): Payer: Medicare Other

## 2018-06-17 ENCOUNTER — Other Ambulatory Visit: Payer: Self-pay

## 2018-06-17 DIAGNOSIS — E538 Deficiency of other specified B group vitamins: Secondary | ICD-10-CM | POA: Diagnosis not present

## 2018-06-17 MED ORDER — CYANOCOBALAMIN 1000 MCG/ML IJ SOLN
1000.0000 ug | Freq: Once | INTRAMUSCULAR | Status: AC
  Administered 2018-06-17: 1000 ug via INTRAMUSCULAR

## 2018-06-17 NOTE — Progress Notes (Signed)
b12 Injection given.   Keyuana Wank J Sergio Hobart, MD  

## 2018-06-17 NOTE — Progress Notes (Signed)
Subjective:    Patient ID: [name], male    DOB: 03/09/1926, 85 y.o.   MRN: [id number]  HPI The patient is here for an acute visit.    Weight loss: He is concerned about weight loss.  His baseline weight was in the 160s-low 170s until last fall.  Over the past month his weight has varied  148-155.  He has taken lasix on occasion due to increased leg edema.  He is eating normally.  He is trying to do some exercise, but not as much as he was in the past.  He is limited physically and what he can do.  His weight this morning was almost exactly what it was 1 month ago.  This is according to his weights at home.   He does some exercise, but not much.   Fatigue:  He feels tired.  This morning he was washing up and had to stop to rest.  Sometimes he can walk just a short distance and feels tired and other times he does not.    He is still having several concerning symptoms.  If he stretches when laying in bed his muscles get "weird".  He has some difficulty raising his arms up at times - does not feel like he can reach up.  He states trouble swallowing at times-he is not always just able to swallow pills, hoarseness that he relates to postnasal drip, shortness of breath at times, muscle weakness and pain. He is following with Dr. Amil Amen and Dr. Tomi Likens.  He did have weird movements of his right hand similar to what he had with his left arm in the recent past that caused him to see Dr. Tomi Likens.  Dr. Amil Amen discussed with him a possible muscle biopsy, which Dr. Amil Amen thought could be avoided.    Wt Readings from Last 3 Encounters:  06/18/18 151 lb (68.5 kg)  05/12/18 155 lb 12.8 oz (70.7 kg)  03/17/18 158 lb 3.2 oz (71.8 kg)       Medications and allergies reviewed with patient and updated if appropriate.  Patient Active Problem List   Diagnosis Date Noted  . 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 Elevated CK 05/12/2018  . Myoclonic jerking 02/10/2018  .  Chronic diastolic CHF (congestive heart failure) ([place]) 02/05/2018  . Pain and swelling of toe of right foot 02/01/2018  . UGIB (upper gastrointestinal bleed) 02/01/2018  . Hyperpigmentation 11/04/2017  . Abnormal appearance of skin 10/07/2017  . Frequent urination 09/03/2017  . Gouty arthritis of right great toe 09/03/2017  . Postnasal drip 08/06/2017  . Rash and nonspecific skin eruption 08/06/2017  . Moderate aortic regurgitation 07/04/2017  . Diastolic dysfunction 62/83/6629  . Edema 06/29/2017  . DOE (dyspnea on exertion) 06/29/2017  . Sinus symptom 06/05/2017  . Fatigue 05/18/2017  . Sleeping difficulty 05/18/2017  . Foot pain, bilateral 04/22/2017  . Trochanteric bursitis 01/10/2017  . Internal hemorrhoids 08/15/2016  . Dysphagia 02/06/2016  . Cephalalgia 07/14/2015  . Constipation 03/01/2015  . Myalgia 03/02/2014  . CHB (complete heart block) ([place]) 05/03/2012  . Postoperative atrial fibrillation ([place]) 05/03/2012  . Pacemaker 04/10/2012  . 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 Coronary atherosclerosis 11/27/2011  . Presence of aortocoronary bypass graft 10/24/2011  . Pulmonary nodule   . CAD (coronary artery disease)   . CKD (chronic kidney disease) stage 3, GFR 30-59 ml/min (HCC)   . Venous insufficiency of leg 06/05/2010  .  SHOULDER PAIN, RIGHT, CHRONIC 10/14/2009  . B12 deficiency 08/23/2009  . Peripheral neuropathy 03/21/2009  . DEGENERATIVE DISC DISEASE, CERVICAL SPINE, W/RADICULOPATHY 09/21/2008  . Dyslipidemia 01/17/2007  . Essential hypertension 07/20/2006  . Prostate cancer ([place]) 07/20/2006    Current Outpatient Medications on File Prior to Visit  Medication Sig Dispense Refill  . allopurinol (ZYLOPRIM) 100 MG tablet Take 1 tablet (100 mg total) by mouth daily. 30 tablet 6  . amLODipine (NORVASC) 2.5 MG tablet Take 1 tablet (2.5 mg total) by mouth daily. 90 tablet 3  . carboxymethylcellulose (REFRESH PLUS) 0.5 % SOLN Place 1 drop  into both eyes daily as needed (dry eyes). 30 mL 1  . clonazePAM (KLONOPIN) 0.25 MG disintegrating tablet Take 1 tablet (0.25 mg total) by mouth at bedtime. 30 tablet 1  . Cyanocobalamin (VITAMIN B-12 IJ) Inject 1 mL as directed every 30 (thirty) days.    [name] epoetin alfa-epbx (RETACRIT) 2000 UNIT/ML injection 2,000 Units 3 (three) times a week.    . ferumoxytol (FERAHEME) 510 MG/17ML SOLN injection Inject 510 mg into the vein once.    . folic acid (FOLVITE) 1 MG tablet TAKE 1 TABLET DAILY 90 tablet 3  . Multiple Vitamins-Minerals (PRESERVISION AREDS 2 PO) Take 1 tablet by mouth daily.    . nitroGLYCERIN (NITROSTAT) 0.4 MG SL tablet DISSOLVE 1 TABLET UNDER THE TONGUE EVERY 5 MINUTES AS NEEDED FOR CHEST PAIN (MAX OF 3 TABLETS) 100 tablet 4  . omeprazole (PRILOSEC) 40 MG capsule Take 40 mg by mouth daily. Before breakfast    . simvastatin (ZOCOR) 20 MG tablet TAKE 1 TABLET DAILY (ANNUAL APPOINTMENT DUE IN [place]. MUST SEE DOCTOR FOR REFILLS) 90 tablet 1  . TOPROL XL 25 MG 24 hr tablet TAKE 1 TABLET AT BEDTIME (DOSE DECREASE) 90 tablet 3  . triamcinolone cream (KENALOG) 0.1 % Apply 1 application topically 2 (two) times daily. 30 g 0  . vitamin C (ASCORBIC ACID) 500 MG tablet Take 1,000 mg by mouth daily.     . Vitamin D, Ergocalciferol, (DRISDOL) 1.25 MG (50000 UT) CAPS capsule Take 50,000 Units by mouth every 7 (seven) days.    . Zinc 50 MG CAPS Take by mouth.    . furosemide (LASIX) 40 MG tablet Take 1 tablet (40 mg total) by mouth as needed for fluid or edema. 90 tablet 3   No current facility-administered medications on file prior to visit.     Past Medical History:  Diagnosis Date  . Acute superficial venous thrombosis of lower extremity    a. RLE after CABG, neg dopp for DVT.  [name] Allergy   . Atrial fibrillation ([place])    a. Post-op from CABG, on amiodarone temporarily, d/c'd 12/2011  . CAD (coronary artery disease)    a. S/P stenting to mid RCA, prox PDA 06/1999. b. NSTEMI/CABG x 3 in  10/2011 with LIMA to LAD, SVG to PDA, and SVG to OM1.   . Chronic UTI    a. Followed by Dr. Risa Grill - colonized/asymptomatic - not on abx  . CKD (chronic kidney disease), stage III (HCC)    a. stable with a creatinine around 1.9-2.0 followed by nephrology.  . Dyslipidemia   . GERD (gastroesophageal reflux disease)   . Hypertension    well-controlled.  . Myocardial infarction ([place])   . Neck injury    a. C3-C4 and C4-C5 foraminal narrowing, severe  . Prostate cancer ([place])    a. 2001 s/p TURP.  . Pulmonary nodule    a. felt  to be noncancerous.  Status post followup CT scan 4 mm and stable.  . Renal artery stenosis ([place])    a. 50% by cath 2001  . 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 cardia catherization  07-07-99  . CARDIAC SURGERY  10/18/12   open heart surgery  . CATARACT EXTRACTION W/ INTRAOCULAR LENS  IMPLANT, BILATERAL  3/205, 06/2013   [facility]  . COLONOSCOPY  04/12/07  . CORONARY ARTERY BYPASS GRAFT  10/19/2011   Procedure: CORONARY ARTERY BYPASS GRAFTING (CABG);  Surgeon: Gaye Pollack, MD;  Location: [place];  Service: Open Heart Surgery;  Laterality: N/A;  times three using Left Internal Mammary Artery and Right Greater Saphenouse Vein Graft harvested Endoscopically  . edg  07-17-1994  . FLEXIBLE SIGMOIDOSCOPY  11-03-1997  . LEFT HEART CATHETERIZATION WITH CORONARY ANGIOGRAM N/A 10/16/2011   Procedure: LEFT HEART CATHETERIZATION WITH CORONARY ANGIOGRAM;  Surgeon: Peter M Martinique, MD;  Location[facility] C[facility] LAB;  Service: Cardiovascular;  Laterality: N/A;  . LEFT HEART CATHETERIZATION WITH CORONARY/GRAFT ANGIOGRAM N/A 03/11/2013   Procedure: LEFT HEART CATHETERIZATION WITH [name];  Surgeon: Blane Ohara, MD;  Location[facility] C[facility] LAB;  Service: Cardiovascular;  Laterality: N/A;  . lumbar spinal disk and neck fusion surgery    . PACEMAKER INSERTION  03/28/12   PPM implanted for  mobitz II AV block  . peripheral vascular catherization  11-24-03  . PERMANENT PACEMAKER INSERTION N/A 03/28/2012   Procedure: PERMANENT PACEMAKER INSERTION;  Surgeon: Thompson Grayer, MD;  Location: [facility] CATH LAB;  Service: Cardiovascular;  Laterality: N/A;  . PROSTATECTOMY    . renal circulation  10-01-03  . s/p ptca    . stents     X 2  . stress cardiolite  05-04-05   spring 09-negative except for apical thinning, EF 68%    Social History   Socioeconomic History  . Marital status: Married    Spouse name: [name]  . Number of children: 2  . Years of education: Not on file  . Highest education level: Not on file  Occupational History  . 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 Financial resource strain: Not on file  . Food insecurity:    Worry: Not on file    Inability: Not on file  . Transportation needs:    Medical: Not on file    Non-medical: Not on file  Tobacco Use  . Smoking status: Never Smoker  . Smokeless tobacco: Never Used  Substance and Sexual Activity  . Alcohol use: Yes    Comment: Rarely  . Drug use: No  . Sexual activity: Not on file  Lifestyle  . Physical activity:    Days per week: Not on file    Minutes per session: Not on file  . Stress: Not on file  Relationships  . Social connections:    Talks on phone: Not on file    Gets together: Not on file    Attends religious service: Not on file    Active member of club or organization: Not on file    Attends meetings of clubs or organizations: Not on file    Relationship status: Not on file  Other Topics Concern  . Not on  file  Social History Narrative   HSG, 1 year college.  married '52 - 3 years, divorced; married '56 - 3 years divorced; married '20-12 yrs - divorced; married '75 -. 1 son '57; 1 daughter - '53; 1 grandchild.  work: [employer] 20  years - mustered out Dietitian; Research officer, trade union, retired.  Very happily married.  End of life care: yes CPR, no long term mechanical ventilation, no heroic measures.    Family History  Problem Relation Age of Onset  . Coronary artery disease Father        died @ 51  . Other Mother        cerebral hemorrhage - died @ 73  . Arthritis Mother   . Cancer Brother        Bladder  . Prostate cancer Brother   . Nephritis Brother        died @ age 53.  . Other Brother        cerebral hemorrhage - died @ 72  . Heart disease Brother   . Breast cancer Other        niece x 2  . Diabetes Neg Hx   . Colon cancer Neg Hx   . Adrenal disorder Neg Hx     Review of Systems  Constitutional: Positive for fatigue. Negative for fever.  HENT: Positive for postnasal drip, trouble swallowing (at times) and voice change (? PND related).   Respiratory: Positive for shortness of breath (with exertion). Negative for cough and wheezing.   Cardiovascular: Positive for leg swelling. Negative for chest pain and palpitations.  Gastrointestinal:       No gerd  Musculoskeletal:       Muscle weakness  Neurological: Negative for headaches.       Decreased fine motor skills       Objective:   Vitals:   06/18/18 1340  BP: 138/82  Pulse: 66  Resp: 16  Temp: 98.7 F (37.1 C)  SpO2: 99%   BP Readings from Last 3 Encounters:  06/18/18 138/82  06/11/18 (!) 141/71  05/28/18 (!) 151/69   Wt Readings from Last 3 Encounters:  06/18/18 151 lb (68.5 kg)  05/12/18 155 lb 12.8 oz (70.7 kg)  03/17/18 158 lb 3.2 oz (71.8 kg)   Body mass index is 21.06 kg/m.   Physical Exam    Constitutional: Appears well-developed and well-nourished. No distress.  HENT:  Head: Normocephalic and atraumatic.  Neck: Neck supple. No tracheal deviation present. No thyromegaly present.  No cervical lymphadenopathy Cardiovascular: Normal rate, regular rhythm and normal heart sounds.   No murmur heard. No carotid bruit .   1 + edema Pulmonary/Chest: Effort normal and breath sounds normal. No respiratory distress. No has no wheezes. No rales.  Skin: Skin is warm and dry. Not diaphoretic.  Psychiatric: Normal mood and affect. Behavior is normal.       Assessment & Plan:    See Problem List for Assessment and Plan of chronic medical problems.

## 2018-06-18 ENCOUNTER — Encounter: Payer: Self-pay | Admitting: Internal Medicine

## 2018-06-18 ENCOUNTER — Ambulatory Visit (INDEPENDENT_AMBULATORY_CARE_PROVIDER_SITE_OTHER): Payer: Medicare Other | Admitting: Internal Medicine

## 2018-06-18 VITALS — BP 138/82 | HR 66 | Temp 98.7°F | Resp 16 | Ht 71.0 in | Wt 151.0 lb

## 2018-06-18 DIAGNOSIS — I25119 Atherosclerotic heart disease of native coronary artery with unspecified angina pectoris: Secondary | ICD-10-CM

## 2018-06-18 DIAGNOSIS — R5383 Other fatigue: Secondary | ICD-10-CM | POA: Diagnosis not present

## 2018-06-18 DIAGNOSIS — R634 Abnormal weight loss: Secondary | ICD-10-CM | POA: Diagnosis not present

## 2018-06-18 DIAGNOSIS — M609 Myositis, unspecified: Secondary | ICD-10-CM | POA: Insufficient documentation

## 2018-06-18 NOTE — Assessment & Plan Note (Signed)
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 Amil Amen May need to consider muscle biopsy for definitive diagnosis Advised him that this could be causing several of his symptoms

## 2018-06-18 NOTE — Patient Instructions (Signed)
Continue monitoring all your vitals at home.   Follow up with Dr Amil Amen and Dr Tomi Likens as scheduled.

## 2018-06-18 NOTE — Assessment & Plan Note (Signed)
He has lost weight over the past year, but over the past month and probably longer his weight has been stable Discussed that this may be his new normal weight Continue to monitor at home daily

## 2018-06-18 NOTE — Assessment & Plan Note (Signed)
Likely multifactorial I think he likely has an inflammatory muscle disease-possible polymyositis or dermatomyositis He will be following up with both Dr. Tomi Likens and Dr. Amil Amen Discussed that his iron deficiency anemia may also be contributing along with some of his other chronic problems Blood work in the past has been unremarkable so we will not repeat

## 2018-06-20 ENCOUNTER — Encounter: Payer: Self-pay | Admitting: Neurology
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
## 2018-06-23 ENCOUNTER — Other Ambulatory Visit: Payer: Self-pay

## 2018-06-23 ENCOUNTER — Telehealth (INDEPENDENT_AMBULATORY_CARE_PROVIDER_SITE_OTHER): Payer: Medicare Other | Admitting: Neurology

## 2018-06-23 ENCOUNTER — Encounter: Payer: Self-pay | Admitting: Neurology

## 2018-06-23 VITALS — Ht 71.0 in | Wt 151.0 lb

## 2018-06-23 DIAGNOSIS — M48062 Spinal stenosis, lumbar region with neurogenic claudication: Secondary | ICD-10-CM

## 2018-06-23 DIAGNOSIS — R258 Other abnormal involuntary movements: Secondary | ICD-10-CM

## 2018-06-23 DIAGNOSIS — R748 Abnormal levels of other serum enzymes: Secondary | ICD-10-CM

## 2018-06-23 DIAGNOSIS — R251 Tremor, unspecified: Secondary | ICD-10-CM | POA: Diagnosis not present

## 2018-06-23 DIAGNOSIS — R531 Weakness: Secondary | ICD-10-CM

## 2018-06-25 ENCOUNTER — Other Ambulatory Visit: Payer: Self-pay

## 2018-06-25 ENCOUNTER — Ambulatory Visit (HOSPITAL_COMMUNITY)
Admission: RE | Admit: 2018-06-25 | Discharge: 2018-06-25 | Disposition: A | Discharge status: Home / Self Care | Payer: Medicare Other | Source: Ambulatory Visit | Attending: Nephrology | Admitting: Nephrology

## 2018-06-25 VITALS — BP 157/82 | HR 80 | Temp 97.6°F | Resp 20

## 2018-06-25 DIAGNOSIS — N183 Chronic kidney disease, stage 3 unspecified: Secondary | ICD-10-CM

## 2018-06-25 LAB — POCT HEMOGLOBIN-HEMACUE: Hemoglobin: 11.7 g/dL — ABNORMAL LOW (ref 13.0–17.0)

## 2018-06-25 MED ORDER — EPOETIN ALFA-EPBX 10000 UNIT/ML IJ SOLN
20000.0000 [IU] | INTRAMUSCULAR | Status: DC
  Administered 2018-06-25: 13:00:00 20000 [IU] via SUBCUTANEOUS
  Filled 2018-06-25: qty 2

## 2018-07-09 ENCOUNTER — Other Ambulatory Visit: Payer: Self-pay

## 2018-07-09 ENCOUNTER — Encounter (HOSPITAL_COMMUNITY)
Admission: RE | Admit: 2018-07-09 | Discharge: 2018-07-09 | Disposition: A | Discharge status: Home / Self Care | Payer: Medicare Other | Source: Ambulatory Visit | Attending: Nephrology | Admitting: Nephrology

## 2018-07-09 VITALS — BP 150/76 | HR 69

## 2018-07-09 DIAGNOSIS — N183 Chronic kidney disease, stage 3 unspecified: Secondary | ICD-10-CM

## 2018-07-09 DIAGNOSIS — D631 Anemia in chronic kidney disease: Secondary | ICD-10-CM | POA: Diagnosis not present

## 2018-07-09 LAB — POCT HEMOGLOBIN-HEMACUE: Hemoglobin: 11.9 g/dL — ABNORMAL LOW (ref 13.0–17.0)

## 2018-07-09 LAB — IRON AND TIBC
Iron: 44 ug/dL — ABNORMAL LOW (ref 45–182)
Saturation Ratios: 12 % — ABNORMAL LOW (ref 17.9–39.5)
TIBC: 371 ug/dL (ref 250–450)
UIBC: 327 ug/dL

## 2018-07-09 LAB — FERRITIN: Ferritin: 19 ng/mL — ABNORMAL LOW (ref 24–336)

## 2018-07-09 MED ORDER — EPOETIN ALFA-EPBX 10000 UNIT/ML IJ SOLN
20000.0000 [IU] | INTRAMUSCULAR | Status: DC
  Administered 2018-07-09: 20000 [IU] via SUBCUTANEOUS
  Filled 2018-07-09: qty 2

## 2018-07-15 ENCOUNTER — Ambulatory Visit (INDEPENDENT_AMBULATORY_CARE_PROVIDER_SITE_OTHER): Payer: Medicare Other | Admitting: *Deleted

## 2018-07-15 DIAGNOSIS — R001 Bradycardia, unspecified: Secondary | ICD-10-CM | POA: Diagnosis not present

## 2018-07-15 LAB — CUP PACEART REMOTE DEVICE CHECK
Battery Impedance: 428 Ohm
Battery Remaining Longevity: 99 mo
Battery Voltage: 2.78 V
Brady Statistic AP VP Percent: 0 %
Brady Statistic AP VS Percent: 44 %
Brady Statistic AS VP Percent: 0 %
Brady Statistic AS VS Percent: 55 %
Date Time Interrogation Session: 20200707125848
Implantable Lead Implant Date: 20140321
Implantable Lead Implant Date: 20140321
Implantable Lead Location: 753859
Implantable Lead Location: 753860
Implantable Lead Model: 5076
Implantable Lead Model: 5092
Implantable Pulse Generator Implant Date: 20140321
Lead Channel Impedance Value: 407 Ohm
Lead Channel Impedance Value: 669 Ohm
Lead Channel Pacing Threshold Amplitude: 0.75 V
Lead Channel Pacing Threshold Amplitude: 0.75 V
Lead Channel Pacing Threshold Pulse Width: 0.4 ms
Lead Channel Pacing Threshold Pulse Width: 0.4 ms
Lead Channel Setting Pacing Amplitude: 2 V
Lead Channel Setting Pacing Amplitude: 2.5 V
Lead Channel Setting Pacing Pulse Width: 0.4 ms
Lead Channel Setting Sensing Sensitivity: 5.6 mV

## 2018-07-18 ENCOUNTER — Ambulatory Visit (INDEPENDENT_AMBULATORY_CARE_PROVIDER_SITE_OTHER): Payer: Medicare Other

## 2018-07-18 ENCOUNTER — Other Ambulatory Visit: Payer: Self-pay

## 2018-07-18 DIAGNOSIS — E538 Deficiency of other specified B group vitamins: Secondary | ICD-10-CM | POA: Diagnosis not present

## 2018-07-18 MED ORDER — CYANOCOBALAMIN 1000 MCG/ML IJ SOLN
1000.0000 ug | Freq: Once | INTRAMUSCULAR | Status: AC
  Administered 2018-07-18: 14:00:00 1000 ug via INTRAMUSCULAR

## 2018-07-18 NOTE — Progress Notes (Signed)
b12 Injection given.   Aidynn Polendo J Kylar Speelman, MD  

## 2018-07-23 ENCOUNTER — Other Ambulatory Visit: Payer: Self-pay

## 2018-07-23 ENCOUNTER — Ambulatory Visit (HOSPITAL_COMMUNITY)
Admission: RE | Admit: 2018-07-23 | Discharge: 2018-07-23 | Disposition: A | Discharge status: Home / Self Care | Payer: Medicare Other | Source: Ambulatory Visit | Attending: Nephrology | Admitting: Nephrology

## 2018-07-23 VITALS — BP 172/72 | HR 81 | Temp 97.2°F | Resp 20

## 2018-07-23 DIAGNOSIS — N183 Chronic kidney disease, stage 3 unspecified: Secondary | ICD-10-CM

## 2018-07-23 LAB — POCT HEMOGLOBIN-HEMACUE: Hemoglobin: 11 g/dL — ABNORMAL LOW (ref 13.0–17.0)

## 2018-07-23 MED ORDER — EPOETIN ALFA-EPBX 10000 UNIT/ML IJ SOLN
20000.0000 [IU] | INTRAMUSCULAR | Status: DC
  Administered 2018-07-23: 20000 [IU] via SUBCUTANEOUS
  Filled 2018-07-23: qty 2

## 2018-07-27 ENCOUNTER — Encounter: Payer: Self-pay | Admitting: Cardiology

## 2018-07-27 NOTE — Progress Notes (Signed)
Remote pacemaker transmission.   

## 2018-08-05 ENCOUNTER — Telehealth: Payer: Self-pay | Admitting: Physician Assistant

## 2018-08-05 NOTE — Telephone Encounter (Signed)
New Message        COVID-19 Pre-Screening Questions:   In the past 7 to 10 days have you had a cough,  shortness of breath, headache, congestion, fever (100 or greater) body aches, chills, sore throat, or sudden loss of taste or sense of smell? NO  Have you been around anyone with known Covid 19. NO  Have you been around anyone who is awaiting Covid 19 test results in the past 7 to 10 days? NO  Have you been around anyone who has been exposed to Covid 19, or has mentioned symptoms of Covid 19 within the past 7 to 10 days? NO Pt says he uses a walker and his wife will be bringing him.   If you have any concerns/questions about symptoms patients report during screening (either on the phone or at threshold). Contact the provider seeing the patient or DOD for further guidance.  If neither are available contact a member of the leadership team.

## 2018-08-06 ENCOUNTER — Other Ambulatory Visit: Payer: Self-pay

## 2018-08-06 ENCOUNTER — Ambulatory Visit (INDEPENDENT_AMBULATORY_CARE_PROVIDER_SITE_OTHER): Payer: Medicare Other | Admitting: Physician Assistant

## 2018-08-06 ENCOUNTER — Encounter (HOSPITAL_COMMUNITY): Payer: Medicare Other

## 2018-08-06 ENCOUNTER — Encounter: Payer: Self-pay | Admitting: Physician Assistant

## 2018-08-06 VITALS — BP 134/72 | HR 72 | Ht 71.0 in | Wt 153.0 lb

## 2018-08-06 DIAGNOSIS — I25119 Atherosclerotic heart disease of native coronary artery with unspecified angina pectoris: Secondary | ICD-10-CM

## 2018-08-06 DIAGNOSIS — I351 Nonrheumatic aortic (valve) insufficiency: Secondary | ICD-10-CM | POA: Diagnosis not present

## 2018-08-06 DIAGNOSIS — I5032 Chronic diastolic (congestive) heart failure: Secondary | ICD-10-CM

## 2018-08-06 DIAGNOSIS — M1990 Unspecified osteoarthritis, unspecified site: Secondary | ICD-10-CM

## 2018-08-06 DIAGNOSIS — I10 Essential (primary) hypertension: Secondary | ICD-10-CM

## 2018-08-06 NOTE — Progress Notes (Signed)
Cardiology Office Note:    Date:  08/06/2018   ID:  [name], DOB December 19, 1926, MRN [id number]  PCP:  [name], MD  Cardiologist:  [name], MD   Electrophysiologist:  None  Nephrologist: Dr. Justin Mend  Referring MD: [name], MD   Chief Complaint  Patient presents with   Follow-up    CAD, CHF     History of Present Illness:    [name] is a 85 y.o. male with:  Coronary artery disease  Status post stenting to the RCA  Status post NSTEMI followed by CABG in 2013  [place] in 2015: LIMA-LAD patent; early failure of all vein grafts >> medical therapy  Myoview 10/19: No significant ischemia  Chronic diastolic heart failure  Symptomatic bradycardia status post pacemaker  Chronic kidney disease  Hyperlipidemia  Hypertension  Prostate cancer  Anemia  Mr. [name] was last seen in January 2020.  At that time, he was undergoing work-up for fatigue, weight loss and myalgias.  He was off of statin therapy due to his myalgias.  He returns for follow-up.  He is here with his wife.  He continues to struggle with fatigue and decreased exercise tolerance.  He has not lost any further weight.  He does feel short of breath when he does things.  His symptoms have been fairly stable.  He did have some improvement with improving hemoglobin.  He still does not have a diagnosis and never did require muscle biopsy.  He continues to follow-up with rheumatology (Dr. Amil Amen).  It sounds as though he had a follow-up CK level obtained recently.  He has not had chest pain, orthopnea, lower extremity swelling or syncope.  Prior CV studies:   The following studies were reviewed today:  ABIs 02/27/2018 R 1.28 L 1.36  Myoview 10/24/17 EF 56.  No significant reversible ischemia. There is a large size, moderate intensity mostly fixed inferior defect, that mildly improves during upright positioning, suggestive of likely scar with a degree of bowel attenuation. There is  also a moderate sized and intensity inferolateral perfusion defect which does change with supine or upright positioning, suggestive of scar. LVEF 56% with mild basal inferior hypokinesis. This is an intermediate risk study given the extent of fixed perfusion defects.  Echo 07/02/17 Mild conc LVH, EF 55-60, no RWMA, Gr 1 DD, mild AS (mean 9), mod AI, mild MAC, mild MR, mod LAE, normal RVSF, mild to mod TR, PASP 39  ABIs 03/10/14 R 1.2, L 1.3  Cardiac Catheterization 03/11/13 Coronary angiography: Coronary dominance:right Left mainstem: The left main arises from the left coronary cusp. There is no significant disease noted. The vessel trifurcates into the LAD, intermediate branch, and left circumflex. Left anterior descending (LAD): The proximal LAD is patent. The first diagonal is patent. The mid LAD has diffuse 50-70% stenosis. The distal vessel fills competitively from the mammary artery. Left circumflex (LCx): The left circumflex system is patent. There is an intermediate branch with mild 30-40% stenosis. The AV circumflex has 50-60% proximal stenosis. The obtuse marginal branches are patent without significant disease. Right coronary artery (RCA): The vessel is moderately calcified. The mid vessel is totally occluded. There is a proximal stent that is widely patent. The vessel is occluded after the stent. The PDA fills from left to right collaterals. Left ventriculography: Deferred LIMA to LAD: Widely patent with no obstructive disease. There is competitive native vessel and graft flow filling the distal LAD. Saphenous vein graft to PDA: Total occlusion  of the proximal aortic anastomosis Saphenous vein graft obtuse marginal: Total occlusion of the proximal aortic anastomosis Final Conclusions:  1. 3 vessel coronary artery disease with total occlusion of the RCA, moderate stenosis of the LAD, and moderate stenosis of the native left circumflex 2. Status post aortocoronary bypass surgery with  continued patency of the LIMA to LAD, total occlusion of the vein graft to PDA, and total occlusion of the vein graft obtuse marginal. Recommendations: The patient has moderate nonobstructive disease of the left circumflex. His LAD is grafted with a widely patent LIMA. The circumflex is now chronically occluded with left to right collaterals. With primarily atypical chest pain symptoms, I would favor ongoing medical therapy.  Myoview 02/17/13 Intermediate risk stress nuclear study. There is a medium size defect of moderate severity and partial reversibility involving the basal and mid-inferior and basal and mid-inferolateral segments. This is new since 2010.  LV Ejection Fraction: 52%. LV Wall Motion: Thereis mild inferior hypokinesis.  Past Medical History:  Diagnosis Date   Acute superficial venous thrombosis of lower extremity    a. RLE after CABG, neg dopp for DVT.   Allergy    Atrial fibrillation ([place])    a. Post-op from CABG, on amiodarone temporarily, d/c'd 12/2011   CAD (coronary artery disease)    a. S/P stenting to mid RCA, prox PDA 06/1999. b. NSTEMI/CABG x 3 in 10/2011 with LIMA to LAD, SVG to PDA, and SVG to OM1.    Chronic UTI    a. Followed by Dr. Risa Grill - colonized/asymptomatic - not on abx   CKD (chronic kidney disease), stage III ([place])    a. stable with a creatinine around 1.9-2.0 followed by nephrology.   Dyslipidemia    GERD (gastroesophageal reflux disease)    Hypertension    well-controlled.   Myocardial infarction ([place])    Neck injury    a. C3-C4 and C4-C5 foraminal narrowing, severe   Prostate cancer ([place])    a. 2001 s/p TURP.   Pulmonary nodule    a. felt to be noncancerous.  Status post followup CT scan 4 mm and stable.   Renal artery stenosis (HCC)    a. 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   Surgical Hx: The patient  has a past surgical history that includes s/p ptca; cardia  catherization (07-07-99); peripheral vascular catherization (11-24-03); renal circulation (10-01-03); stress cardiolite (05-04-05); edg (07-17-1994); Flexible sigmoidoscopy (11-03-1997); Prostatectomy; lumbar spinal disk and neck fusion surgery; stents; Coronary artery bypass graft (10/19/2011); Cardiac surgery (10/18/12); Colonoscopy (04/12/07); Pacemaker insertion (03/28/12); Cataract extraction w/ intraocular lens  implant, bilateral (3/205, 06/2013); left heart catheterization with coronary angiogram (N/A, 10/16/2011); permanent pacemaker insertion (N/A, 03/28/2012); and left heart catheterization with coronary/graft angiogram (N/A, 03/11/2013).   Current Medications: Current Meds  Medication Sig   amLODipine (NORVASC) 2.5 MG tablet Take 1 tablet (2.5 mg total) by mouth daily.   aspirin EC 81 MG tablet Take 81 mg by mouth daily.   carboxymethylcellulose (REFRESH PLUS) 0.5 % SOLN Place 1 drop into both eyes daily as needed (dry eyes).   Cyanocobalamin (VITAMIN B-12 IJ) Inject 1 mL as directed every 30 (thirty) days.   epoetin alfa-epbx (RETACRIT) 2000 UNIT/ML injection 2,000 Units every 14 (fourteen) days.   folic acid (FOLVITE) 1 MG tablet TAKE 1 TABLET DAILY   Multiple Vitamins-Minerals (PRESERVISION AREDS 2 PO) Take 1 tablet by mouth daily.   nitroGLYCERIN (NITROSTAT) 0.4 MG SL  tablet DISSOLVE 1 TABLET UNDER THE TONGUE EVERY 5 MINUTES AS NEEDED FOR CHEST PAIN (MAX OF 3 TABLETS)   TOPROL XL 25 MG 24 hr tablet TAKE 1 TABLET AT BEDTIME (DOSE DECREASE)   triamcinolone cream (KENALOG) 0.1 % Apply 1 application topically as needed.   vitamin C (ASCORBIC ACID) 500 MG tablet Take 1,000 mg by mouth daily.    Vitamin D, Ergocalciferol, (DRISDOL) 1.25 MG (50000 UT) CAPS capsule Take 50,000 Units by mouth every 30 (thirty) days.    Zinc 50 MG CAPS Take by mouth.     Allergies:   Aspirin, Amoxicillin, Atarax [hydroxyzine hcl], Cephalexin, Ciprofloxacin, Clindamycin, Clobetasol, Codeine, Fluarix [flu  virus vaccine], Haemophilus influenzae, Hydrocodone, Hydrocodone-acetaminophen, Hydroxyzine, Latex, Lisinopril, Macrobid [nitrofurantoin macrocrystal], Niacin, Niacin-lovastatin er, Niacin-lovastatin er, Nitrofurantoin, Vibramycin  [doxycycline], Bactrim [sulfamethoxazole-trimethoprim], Colchicine, and Gabapentin   Social History   Tobacco Use   Smoking status: Never Smoker   Smokeless tobacco: Never Used  Substance Use Topics   Alcohol use: Yes    Comment: Rarely   Drug use: No     Family Hx: The patient's family history includes Arthritis in his mother; Breast cancer in an other family member; Cancer in his brother; Coronary artery disease in his father; Heart disease in his brother; Nephritis in his brother; Other in his brother and mother; Prostate cancer in his brother. There is no history of Diabetes, Colon cancer, or Adrenal disorder.  ROS:   Please see the history of present illness.    ROS All other systems reviewed and are negative.   EKGs/Labs/Other Test Reviewed:    EKG:  EKG is not  ordered today.  The ekg ordered today demonstrates n/a  Recent Labs: 10/07/2017: Pro B Natriuretic peptide (BNP) 439.0 02/02/2018: Platelets 401; TSH 1.827 05/12/2018: ALT 14; BUN 31; Creatinine, Ser 1.77; Potassium 4.1; Sodium 136 07/23/2018: Hemoglobin 11.0   Recent Lipid Panel Lab Results  Component Value Date/Time   CHOL 122 09/04/2016 11:23 AM   TRIG 59.0 09/04/2016 11:23 AM   TRIG 107 01/16/2006 01:38 PM   HDL 40.40 09/04/2016 11:23 AM   CHOLHDL 3 09/04/2016 11:23 AM   LDLCALC 70 09/04/2016 11:23 AM    Physical Exam:    VS:  BP 134/72    Pulse 72    Ht _0  (1.803 m)    Wt 153 lb (69.4 kg)    SpO2 98%    BMI 21.34 kg/m     Wt Readings from Last 3 Encounters:  08/06/18 153 lb (69.4 kg)  06/20/18 151 lb (68.5 kg)  06/18/18 151 lb (68.5 kg)     Physical Exam  Constitutional: He is oriented to person, place, and time. He appears well-developed and well-nourished. No  distress.  HENT:  Head: Normocephalic and atraumatic.  Eyes: No scleral icterus.  Neck: No JVD present. No thyromegaly present.  Cardiovascular: Normal rate and regular rhythm.  Murmur heard.  Crescendo-decrescendo systolic murmur is present with a grade of 2/6 at the upper right sternal border. Pulmonary/Chest: Effort normal and breath sounds normal. He has no rales.  Abdominal: Soft. There is no hepatomegaly.  Musculoskeletal:        General: No edema.  Lymphadenopathy:    He has no cervical adenopathy.  Neurological: He is alert and oriented to person, place, and time.  Skin: Skin is warm and dry.  Psychiatric: He has a normal mood and affect.    ASSESSMENT & PLAN:    1. Chronic diastolic CHF (congestive heart failure) (HCC) EF  55-60 by echo in 6/19.  Volume status appears stable.  He only takes Lasix as needed.  He does describe some dyspnea with exertion.  However, he has had significant issues with fatigue, myalgias and weight loss.  At this point, heart failure does not seem to be playing a role in those symptoms.  2. Coronary artery disease involving native coronary artery of native heart with angina pectoris ([place]) History of stenting to the RCA in 2001 and non-STEMI in 2013 treated with CABG. cardiac catheterization in 2015 demonstrated a patent LIMA-LAD and early failure of all vein grafts.  Myoview in October 2019 demonstrated no ischemia.  He is not having chest pain.  He does describe decreased exercise tolerance and shortness of breath.  I did consider whether or not to place him on isosorbide to see if this would provide any improved symptoms.  However, given his current symptoms, I am not convinced that this is anginal in nature.  I have recommended that we proceed with a follow-up echocardiogram to reassess his aortic stenosis and aortic insufficiency.  If he does have reduced LV function, we could consider ischemic work-up and adjusted medical therapy.  3.  Aortic valve  disease Moderate AI and mild left ear by most recent echocardiogram in June 2019.  Given his symptomatology, I recommended follow-up echocardiography to reassess his aortic valve disease.  -Arrange echocardiogram  4. Essential hypertension The patient's blood pressure is controlled on his current regimen.  Continue current therapy.   5. Osteoarthritis, unspecified osteoarthritis type, unspecified site He sees his orthopedist tomorrow.  He has a lot of problems with his left hip.  He is certain that his orthopedist will need an MRI.  I reviewed his chart.  When he last saw Dr. Rayann Heman, he noted that his pacemaker is not compatible with MRI.  However, he did note that if it is absolutely necessary, an MRI could possibly be arranged at [facility].  I have asked the patient to discuss this with his orthopedist tomorrow and have him contact Dr. Rayann Heman if an MRI is absolutely necessary.  Dispo:  Return in about 6 months (around 02/06/2019) for Routine Follow Up, w/ Dr. Burt Knack, or Richardson Dopp, PA-C.   Medication Adjustments/Labs and Tests Ordered: Current medicines are reviewed at length with the patient today.  Concerns regarding medicines are outlined above.  Tests Ordered: Orders Placed This Encounter  Procedures   ECHOCARDIOGRAM COMPLETE   Medication Changes: No orders of the defined types were placed in this encounter.   Signed, Richardson Dopp, PA-C  08/06/2018 6:07 PM    [facility] [address], [place], [place]  [address] Phone: [phone]; Fax: [phone]

## 2018-08-06 NOTE — Patient Instructions (Signed)
Medication Instructions:  No changes  If you need a refill on your cardiac medications before your next appointment, please call your pharmacy.   Lab work: None   If you have labs (blood work) drawn today and your tests are completely normal, you will receive your results only by: [name] MyChart Message (if you have MyChart) OR . A paper copy in the mail If you have any lab test that is abnormal or we need to change your treatment, we will call you to review the results.  Testing/Procedures:  Schedule an echocardiogram  Your physician has requested that you have an echocardiogram. Echocardiography is a painless test that uses sound waves to create images of your heart. It provides your doctor with information about the size and shape of your heart and how well your heart's chambers and valves are working. This procedure takes approximately one hour. There are no restrictions for this procedure.    Follow-Up: At [facility], you and your health needs are our priority.  As part of our continuing mission to provide you with exceptional heart care, we have created designated Provider Care Teams.  These Care Teams include your primary Cardiologist (physician) and Advanced Practice Providers (APPs -  Physician Assistants and Nurse Practitioners) who all work together to provide you with the care you need, when you need it. You will need a follow up appointment in:  6 months.  Please call our office 2 months in advance to schedule this appointment.  You may see [name], MD or Richardson Dopp, PA-C   Any Other Special Instructions Will Be Listed Below (If Applicable). Tell your orthopedist (Dr. Alvan Dame) that if an MRI is absolutely necessary, he will need to get in touch with Dr. Rayann Heman to discuss the possibility of arranging it at [facility].

## 2018-08-07 ENCOUNTER — Encounter (HOSPITAL_COMMUNITY)
Admission: RE | Admit: 2018-08-07 | Discharge: 2018-08-07 | Disposition: A | Discharge status: Home / Self Care | Payer: Medicare Other | Source: Ambulatory Visit | Attending: Nephrology | Admitting: Nephrology

## 2018-08-07 ENCOUNTER — Telehealth: Payer: Self-pay | Admitting: Internal Medicine

## 2018-08-07 VITALS — BP 159/81 | HR 82 | Temp 97.8°F | Resp 20

## 2018-08-07 DIAGNOSIS — N183 Chronic kidney disease, stage 3 unspecified: Secondary | ICD-10-CM

## 2018-08-07 LAB — POCT HEMOGLOBIN-HEMACUE: Hemoglobin: 10.9 g/dL — ABNORMAL LOW (ref 13.0–17.0)

## 2018-08-07 MED ORDER — EPOETIN ALFA-EPBX 10000 UNIT/ML IJ SOLN
20000.0000 [IU] | INTRAMUSCULAR | Status: DC
  Administered 2018-08-07: 20000 [IU] via SUBCUTANEOUS
  Filled 2018-08-07: qty 2

## 2018-08-07 NOTE — Telephone Encounter (Signed)
New Message    Patient calling in stating that he is scheduled to see Dr. Alvan Dame tomorrow (08/08/18), and states that he was told to take the notes from his last appointment with Dr. Rayann Heman. Patient is requesting a call back to have this information provided.

## 2018-08-11 ENCOUNTER — Other Ambulatory Visit: Payer: Self-pay

## 2018-08-11 ENCOUNTER — Ambulatory Visit (HOSPITAL_COMMUNITY): Payer: Medicare Other | Attending: Cardiology

## 2018-08-11 ENCOUNTER — Encounter: Payer: Self-pay | Admitting: Physician Assistant

## 2018-08-11 DIAGNOSIS — I25119 Atherosclerotic heart disease of native coronary artery with unspecified angina pectoris: Secondary | ICD-10-CM | POA: Diagnosis not present

## 2018-08-11 DIAGNOSIS — I5032 Chronic diastolic (congestive) heart failure: Secondary | ICD-10-CM | POA: Insufficient documentation

## 2018-08-18 ENCOUNTER — Ambulatory Visit (INDEPENDENT_AMBULATORY_CARE_PROVIDER_SITE_OTHER): Payer: Medicare Other

## 2018-08-18 ENCOUNTER — Other Ambulatory Visit: Payer: Self-pay

## 2018-08-18 DIAGNOSIS — E538 Deficiency of other specified B group vitamins: Secondary | ICD-10-CM

## 2018-08-18 MED ORDER — CYANOCOBALAMIN 1000 MCG/ML IJ SOLN: 1000.0000 ug | Freq: Once | INTRAMUSCULAR | Status: DC

## 2018-08-18 MED ORDER — CYANOCOBALAMIN 1000 MCG/ML IJ SOLN
1000.0000 ug | Freq: Once | INTRAMUSCULAR | Status: AC
  Administered 2018-08-18: 1000 ug via INTRAMUSCULAR

## 2018-08-18 NOTE — Progress Notes (Signed)
b12 Injection given.   Blakely Gluth J Velmer Woelfel, MD  

## 2018-08-19 ENCOUNTER — Encounter: Payer: Self-pay | Admitting: Internal Medicine

## 2018-08-21 ENCOUNTER — Ambulatory Visit (HOSPITAL_COMMUNITY)
Admission: RE | Admit: 2018-08-21 | Discharge: 2018-08-21 | Disposition: A | Discharge status: Home / Self Care | Payer: Medicare Other | Source: Ambulatory Visit | Attending: Nephrology | Admitting: Nephrology

## 2018-08-21 ENCOUNTER — Other Ambulatory Visit: Payer: Self-pay

## 2018-08-21 VITALS — BP 160/78 | HR 82 | Temp 97.5°F | Resp 20

## 2018-08-21 DIAGNOSIS — D631 Anemia in chronic kidney disease: Secondary | ICD-10-CM | POA: Diagnosis not present

## 2018-08-21 DIAGNOSIS — N183 Chronic kidney disease, stage 3 unspecified: Secondary | ICD-10-CM

## 2018-08-21 LAB — IRON AND TIBC
Iron: 67 ug/dL (ref 45–182)
Saturation Ratios: 18 % (ref 17.9–39.5)
TIBC: 374 ug/dL (ref 250–450)
UIBC: 307 ug/dL

## 2018-08-21 LAB — POCT HEMOGLOBIN-HEMACUE: Hemoglobin: 11.8 g/dL — ABNORMAL LOW (ref 13.0–17.0)

## 2018-08-21 LAB — FERRITIN: Ferritin: 16 ng/mL — ABNORMAL LOW (ref 24–336)

## 2018-08-21 MED ORDER — EPOETIN ALFA-EPBX 10000 UNIT/ML IJ SOLN
20000.0000 [IU] | INTRAMUSCULAR | Status: DC
  Administered 2018-08-21: 20000 [IU] via SUBCUTANEOUS
  Filled 2018-08-21: qty 2

## 2018-09-04 ENCOUNTER — Ambulatory Visit (HOSPITAL_COMMUNITY)
Admission: RE | Admit: 2018-09-04 | Discharge: 2018-09-04 | Disposition: A | Discharge status: Home / Self Care | Payer: Medicare Other | Source: Ambulatory Visit | Attending: Nephrology | Admitting: Nephrology

## 2018-09-04 ENCOUNTER — Other Ambulatory Visit: Payer: Self-pay

## 2018-09-04 VITALS — BP 173/84 | HR 81 | Temp 97.3°F | Resp 20

## 2018-09-04 DIAGNOSIS — N183 Chronic kidney disease, stage 3 unspecified: Secondary | ICD-10-CM

## 2018-09-04 LAB — POCT HEMOGLOBIN-HEMACUE: Hemoglobin: 11.2 g/dL — ABNORMAL LOW (ref 13.0–17.0)

## 2018-09-04 MED ORDER — EPOETIN ALFA-EPBX 10000 UNIT/ML IJ SOLN
20000.0000 [IU] | INTRAMUSCULAR | Status: DC
  Administered 2018-09-04: 20000 [IU] via SUBCUTANEOUS
  Filled 2018-09-04: qty 2

## 2018-09-17 ENCOUNTER — Ambulatory Visit (INDEPENDENT_AMBULATORY_CARE_PROVIDER_SITE_OTHER): Payer: Medicare Other

## 2018-09-17 DIAGNOSIS — Z23 Encounter for immunization: Secondary | ICD-10-CM

## 2018-09-17 DIAGNOSIS — E538 Deficiency of other specified B group vitamins: Secondary | ICD-10-CM | POA: Diagnosis not present

## 2018-09-17 MED ORDER — CYANOCOBALAMIN 1000 MCG/ML IJ SOLN
1000.0000 ug | Freq: Once | INTRAMUSCULAR | Status: AC
  Administered 2018-09-17: 14:00:00 1000 ug via INTRAMUSCULAR

## 2018-09-17 NOTE — Progress Notes (Signed)
b12 Injection given.   [name] Mike Tatym Schermer, MD  

## 2018-09-17 NOTE — Addendum Note (Signed)
Addended by: Ander Slade on: 09/17/2018 01:40 PM   Modules accepted: Orders

## 2018-09-18 ENCOUNTER — Ambulatory Visit (HOSPITAL_COMMUNITY)
Admission: RE | Admit: 2018-09-18 | Discharge: 2018-09-18 | Disposition: A | Discharge status: Home / Self Care | Payer: Medicare Other | Source: Ambulatory Visit | Attending: Nephrology | Admitting: Nephrology

## 2018-09-18 ENCOUNTER — Other Ambulatory Visit: Payer: Self-pay

## 2018-09-18 VITALS — BP 171/77 | HR 80 | Temp 97.2°F | Resp 20

## 2018-09-18 DIAGNOSIS — N183 Chronic kidney disease, stage 3 unspecified: Secondary | ICD-10-CM

## 2018-09-18 DIAGNOSIS — D631 Anemia in chronic kidney disease: Secondary | ICD-10-CM | POA: Insufficient documentation

## 2018-09-18 LAB — IRON AND TIBC
Iron: 46 ug/dL (ref 45–182)
Saturation Ratios: 14 % — ABNORMAL LOW (ref 17.9–39.5)
TIBC: 333 ug/dL (ref 250–450)
UIBC: 287 ug/dL

## 2018-09-18 LAB — FERRITIN: Ferritin: 20 ng/mL — ABNORMAL LOW (ref 24–336)

## 2018-09-18 MED ORDER — EPOETIN ALFA-EPBX 10000 UNIT/ML IJ SOLN
20000.0000 [IU] | INTRAMUSCULAR | Status: DC
  Administered 2018-09-18: 20000 [IU] via SUBCUTANEOUS
  Filled 2018-09-18: qty 2

## 2018-09-19 LAB — POCT HEMOGLOBIN-HEMACUE: Hemoglobin: 11 g/dL — ABNORMAL LOW (ref 13.0–17.0)

## 2018-10-02 ENCOUNTER — Other Ambulatory Visit: Payer: Self-pay

## 2018-10-02 ENCOUNTER — Ambulatory Visit (HOSPITAL_COMMUNITY)
Admission: RE | Admit: 2018-10-02 | Discharge: 2018-10-02 | Disposition: A | Discharge status: Home / Self Care | Payer: Medicare Other | Source: Ambulatory Visit | Attending: Nephrology | Admitting: Nephrology

## 2018-10-02 VITALS — BP 162/72 | HR 64 | Temp 97.6°F | Resp 20

## 2018-10-02 DIAGNOSIS — N183 Chronic kidney disease, stage 3 unspecified: Secondary | ICD-10-CM

## 2018-10-02 LAB — POCT HEMOGLOBIN-HEMACUE: Hemoglobin: 11.2 g/dL — ABNORMAL LOW (ref 13.0–17.0)

## 2018-10-02 MED ORDER — EPOETIN ALFA-EPBX 10000 UNIT/ML IJ SOLN
20000.0000 [IU] | INTRAMUSCULAR | Status: DC
  Administered 2018-10-02: 20000 [IU] via SUBCUTANEOUS
  Filled 2018-10-02: qty 2

## 2018-10-04 ENCOUNTER — Ambulatory Visit (HOSPITAL_COMMUNITY): Payer: Medicare Other | Admitting: Anesthesiology

## 2018-10-04 ENCOUNTER — Other Ambulatory Visit: Payer: Self-pay

## 2018-10-04 ENCOUNTER — Encounter (HOSPITAL_COMMUNITY)
Admission: EM | Disposition: A | Discharge status: Home / Self Care | Payer: Self-pay | Source: Other Acute Inpatient Hospital | Attending: Ophthalmology

## 2018-10-04 ENCOUNTER — Ambulatory Visit: Payer: Self-pay | Admitting: Ophthalmology

## 2018-10-04 ENCOUNTER — Emergency Department (HOSPITAL_COMMUNITY)
Admission: EM | Admit: 2018-10-04 | Discharge: 2018-10-04 | Disposition: A | Discharge status: Home / Self Care | Payer: Medicare Other | Source: Home / Self Care | Attending: Emergency Medicine | Admitting: Emergency Medicine

## 2018-10-04 ENCOUNTER — Encounter (HOSPITAL_COMMUNITY): Payer: Self-pay | Admitting: *Deleted

## 2018-10-04 ENCOUNTER — Ambulatory Visit (HOSPITAL_COMMUNITY)
Admission: EM | Admit: 2018-10-04 | Discharge: 2018-10-04 | Disposition: A | Discharge status: Home / Self Care | Payer: Medicare Other | Source: Other Acute Inpatient Hospital | Attending: Ophthalmology | Admitting: Ophthalmology

## 2018-10-04 DIAGNOSIS — H44002 Unspecified purulent endophthalmitis, left eye: Secondary | ICD-10-CM

## 2018-10-04 DIAGNOSIS — I5032 Chronic diastolic (congestive) heart failure: Secondary | ICD-10-CM | POA: Diagnosis not present

## 2018-10-04 DIAGNOSIS — Z881 Allergy status to other antibiotic agents status: Secondary | ICD-10-CM | POA: Diagnosis not present

## 2018-10-04 DIAGNOSIS — Z8249 Family history of ischemic heart disease and other diseases of the circulatory system: Secondary | ICD-10-CM | POA: Diagnosis not present

## 2018-10-04 DIAGNOSIS — Z950 Presence of cardiac pacemaker: Secondary | ICD-10-CM | POA: Diagnosis not present

## 2018-10-04 DIAGNOSIS — I130 Hypertensive heart and chronic kidney disease with heart failure and stage 1 through stage 4 chronic kidney disease, or unspecified chronic kidney disease: Secondary | ICD-10-CM | POA: Diagnosis not present

## 2018-10-04 DIAGNOSIS — Z888 Allergy status to other drugs, medicaments and biological substances status: Secondary | ICD-10-CM | POA: Diagnosis not present

## 2018-10-04 DIAGNOSIS — Z79899 Other long term (current) drug therapy: Secondary | ICD-10-CM | POA: Diagnosis not present

## 2018-10-04 DIAGNOSIS — I2510 Atherosclerotic heart disease of native coronary artery without angina pectoris: Secondary | ICD-10-CM | POA: Insufficient documentation

## 2018-10-04 DIAGNOSIS — Z8546 Personal history of malignant neoplasm of prostate: Secondary | ICD-10-CM | POA: Insufficient documentation

## 2018-10-04 DIAGNOSIS — Z20828 Contact with and (suspected) exposure to other viral communicable diseases: Secondary | ICD-10-CM | POA: Insufficient documentation

## 2018-10-04 DIAGNOSIS — N183 Chronic kidney disease, stage 3 (moderate): Secondary | ICD-10-CM | POA: Insufficient documentation

## 2018-10-04 DIAGNOSIS — I129 Hypertensive chronic kidney disease with stage 1 through stage 4 chronic kidney disease, or unspecified chronic kidney disease: Secondary | ICD-10-CM | POA: Insufficient documentation

## 2018-10-04 DIAGNOSIS — Z7982 Long term (current) use of aspirin: Secondary | ICD-10-CM | POA: Insufficient documentation

## 2018-10-04 DIAGNOSIS — Z91040 Latex allergy status: Secondary | ICD-10-CM | POA: Insufficient documentation

## 2018-10-04 DIAGNOSIS — Z880 Allergy status to penicillin: Secondary | ICD-10-CM | POA: Insufficient documentation

## 2018-10-04 DIAGNOSIS — Z951 Presence of aortocoronary bypass graft: Secondary | ICD-10-CM | POA: Insufficient documentation

## 2018-10-04 DIAGNOSIS — H538 Other visual disturbances: Secondary | ICD-10-CM | POA: Insufficient documentation

## 2018-10-04 DIAGNOSIS — Z886 Allergy status to analgesic agent status: Secondary | ICD-10-CM | POA: Insufficient documentation

## 2018-10-04 DIAGNOSIS — I442 Atrioventricular block, complete: Secondary | ICD-10-CM | POA: Diagnosis not present

## 2018-10-04 DIAGNOSIS — I252 Old myocardial infarction: Secondary | ICD-10-CM | POA: Insufficient documentation

## 2018-10-04 DIAGNOSIS — Z885 Allergy status to narcotic agent status: Secondary | ICD-10-CM | POA: Insufficient documentation

## 2018-10-04 DIAGNOSIS — Z955 Presence of coronary angioplasty implant and graft: Secondary | ICD-10-CM | POA: Insufficient documentation

## 2018-10-04 HISTORY — PX: ANTERIOR CHAMBER WASHOUT: SHX5291

## 2018-10-04 HISTORY — PX: GAS INSERTION: SHX5336

## 2018-10-04 HISTORY — PX: GAS/FLUID EXCHANGE: SHX5334

## 2018-10-04 HISTORY — PX: PARS PLANA VITRECTOMY: SHX2166

## 2018-10-04 HISTORY — DX: Anemia, unspecified: D64.9

## 2018-10-04 LAB — BASIC METABOLIC PANEL
Anion gap: 8 (ref 5–15)
BUN: 20 mg/dL (ref 8–23)
CO2: 23 mmol/L (ref 22–32)
Calcium: 8.9 mg/dL (ref 8.9–10.3)
Chloride: 105 mmol/L (ref 98–111)
Creatinine, Ser: 1.52 mg/dL — ABNORMAL HIGH (ref 0.61–1.24)
GFR calc Af Amer: 46 mL/min — ABNORMAL LOW (ref >60)
GFR calc non Af Amer: 40 mL/min — ABNORMAL LOW (ref >60)
Glucose, Bld: 100 mg/dL — ABNORMAL HIGH (ref 70–99)
Potassium: 4.3 mmol/L (ref 3.5–5.1)
Sodium: 136 mmol/L (ref 135–145)

## 2018-10-04 LAB — SARS CORONAVIRUS 2 BY RT PCR (HOSPITAL ORDER, PERFORMED IN ~~LOC~~ HOSPITAL LAB): SARS Coronavirus 2: NEGATIVE

## 2018-10-04 SURGERY — PARS PLANA VITRECTOMY 25 GAUGE FOR ENDOPHTHALMITIS
Anesthesia: Monitor Anesthesia Care | Site: Eye | Laterality: Left

## 2018-10-04 MED ORDER — CYCLOPENTOLATE HCL 1 % OP SOLN
OPHTHALMIC | Status: AC
  Administered 2018-10-04: 1 [drp] via OPHTHALMIC
  Filled 2018-10-04: qty 2

## 2018-10-04 MED ORDER — DEXAMETHASONE SODIUM PHOSPHATE 10 MG/ML IJ SOLN
INTRAMUSCULAR | Status: DC | PRN
  Administered 2018-10-04: 10 mg

## 2018-10-04 MED ORDER — PHENYLEPHRINE HCL 2.5 % OP SOLN
OPHTHALMIC | Status: AC
  Administered 2018-10-04: 1 [drp] via OPHTHALMIC
  Filled 2018-10-04: qty 2

## 2018-10-04 MED ORDER — PROVISC 10 MG/ML IO SOLN
INTRAOCULAR | Status: DC | PRN
  Administered 2018-10-04: .85 mL via INTRAOCULAR

## 2018-10-04 MED ORDER — NA CHONDROIT SULF-NA HYALURON 40-30 MG/ML IO SOLN
INTRAOCULAR | Status: AC
  Filled 2018-10-04: qty 0.5

## 2018-10-04 MED ORDER — VANCOMYCIN SUBCONJUNCTIVAL INJECTION 25 MG/0.5 ML
INTRAOCULAR | Status: DC | PRN
  Administered 2018-10-04: 25 mg via SUBCONJUNCTIVAL

## 2018-10-04 MED ORDER — LIDOCAINE 2% (20 MG/ML) 5 ML SYRINGE
INTRAMUSCULAR | Status: AC
  Filled 2018-10-04: qty 5

## 2018-10-04 MED ORDER — LIDOCAINE HCL 2 % IJ SOLN
INTRAMUSCULAR | Status: AC
  Filled 2018-10-04: qty 20

## 2018-10-04 MED ORDER — DEXAMETHASONE SODIUM PHOSPHATE 10 MG/ML IJ SOLN
INTRAMUSCULAR | Status: AC
  Filled 2018-10-04: qty 1

## 2018-10-04 MED ORDER — FENTANYL CITRATE (PF) 100 MCG/2ML IJ SOLN
INTRAMUSCULAR | Status: DC | PRN
  Administered 2018-10-04: 100 ug via INTRAVENOUS

## 2018-10-04 MED ORDER — CYCLOPENTOLATE HCL 1 % OP SOLN
1.0000 [drp] | OPHTHALMIC | Status: AC | PRN
  Administered 2018-10-04 (×3): 1 [drp] via OPHTHALMIC

## 2018-10-04 MED ORDER — FENTANYL CITRATE (PF) 250 MCG/5ML IJ SOLN
INTRAMUSCULAR | Status: AC
  Filled 2018-10-04: qty 5

## 2018-10-04 MED ORDER — SODIUM CHLORIDE 0.9 % IV SOLN
INTRAVENOUS | Status: DC
  Administered 2018-10-04: 18:00:00 via INTRAVENOUS

## 2018-10-04 MED ORDER — TOBRAMYCIN-DEXAMETHASONE 0.3-0.1 % OP OINT
TOPICAL_OINTMENT | OPHTHALMIC | Status: DC | PRN
  Administered 2018-10-04: 1 via OPHTHALMIC

## 2018-10-04 MED ORDER — HYPROMELLOSE (GONIOSCOPIC) 2.5 % OP SOLN
OPHTHALMIC | Status: DC | PRN
  Administered 2018-10-04: 2 [drp] via OPHTHALMIC

## 2018-10-04 MED ORDER — TOBRAMYCIN-DEXAMETHASONE 0.3-0.1 % OP OINT
TOPICAL_OINTMENT | OPHTHALMIC | Status: AC
  Filled 2018-10-04: qty 3.5

## 2018-10-04 MED ORDER — CEFTAZIDIME INTRAVITREAL INJECTION 2.25 MG/0.1 ML
2.2500 mg | INTRAVITREAL | Status: DC
  Filled 2018-10-04: qty 0.1

## 2018-10-04 MED ORDER — PHENYLEPHRINE HCL 2.5 % OP SOLN
1.0000 [drp] | OPHTHALMIC | Status: AC | PRN
  Administered 2018-10-04 (×3): 1 [drp] via OPHTHALMIC

## 2018-10-04 MED ORDER — VANCOMYCIN INTRAVITREAL INJECTION 1 MG/0.1 ML
1.0000 mg | INTRAOCULAR | Status: DC
  Filled 2018-10-04: qty 0.1

## 2018-10-04 MED ORDER — HYALURONIDASE HUMAN 150 UNIT/ML IJ SOLN
INTRAMUSCULAR | Status: AC
  Filled 2018-10-04: qty 1

## 2018-10-04 MED ORDER — EPINEPHRINE PF 1 MG/ML IJ SOLN
INTRAOCULAR | Status: DC | PRN
  Administered 2018-10-04: 500 mL

## 2018-10-04 MED ORDER — BUPIVACAINE HCL (PF) 0.75 % IJ SOLN
INTRAMUSCULAR | Status: AC
  Filled 2018-10-04: qty 10

## 2018-10-04 MED ORDER — BSS IO SOLN
INTRAOCULAR | Status: DC | PRN
  Administered 2018-10-04: 15 mL via INTRAOCULAR

## 2018-10-04 MED ORDER — GATIFLOXACIN 0.5 % OP SOLN
OPHTHALMIC | Status: AC
  Administered 2018-10-04: 1 [drp] via OPHTHALMIC
  Filled 2018-10-04: qty 2.5

## 2018-10-04 MED ORDER — DEXAMETHASONE SODIUM PHOSPHATE 10 MG/ML IJ SOLN
INTRAMUSCULAR | Status: DC | PRN
  Administered 2018-10-04: 10 mg via INTRAVENOUS

## 2018-10-04 MED ORDER — HYPROMELLOSE (GONIOSCOPIC) 2.5 % OP SOLN
OPHTHALMIC | Status: AC
  Filled 2018-10-04: qty 15

## 2018-10-04 MED ORDER — PROPOFOL 10 MG/ML IV BOLUS
INTRAVENOUS | Status: AC
  Filled 2018-10-04: qty 20

## 2018-10-04 MED ORDER — SODIUM HYALURONATE 10 MG/ML IO SOLN
INTRAOCULAR | Status: AC
  Filled 2018-10-04: qty 0.85

## 2018-10-04 MED ORDER — NA CHONDROIT SULF-NA HYALURON 40-30 MG/ML IO SOLN
INTRAOCULAR | Status: DC | PRN
  Administered 2018-10-04: 0.5 mL via INTRAOCULAR

## 2018-10-04 MED ORDER — ATROPINE SULFATE 1 % OP SOLN
OPHTHALMIC | Status: AC
  Filled 2018-10-04: qty 5

## 2018-10-04 MED ORDER — EPINEPHRINE PF 1 MG/ML IJ SOLN
INTRAMUSCULAR | Status: AC
  Filled 2018-10-04: qty 1

## 2018-10-04 MED ORDER — DEXAMETHASONE SODIUM PHOSPHATE 10 MG/ML IJ SOLN
INTRAMUSCULAR | Status: AC
  Filled 2018-10-04: qty 2

## 2018-10-04 MED ORDER — CEFTAZIDIME INTRAVITREAL INJECTION 2.25 MG/0.1 ML
INTRAVITREAL | Status: DC | PRN
  Administered 2018-10-04: 2.25 mg via INTRAVITREAL

## 2018-10-04 MED ORDER — LIDOCAINE HCL 2 % IJ SOLN
INTRAMUSCULAR | Status: DC | PRN
  Administered 2018-10-04: 4.5 mL via RETROBULBAR

## 2018-10-04 MED ORDER — BSS IO SOLN
INTRAOCULAR | Status: AC
  Filled 2018-10-04: qty 15

## 2018-10-04 MED ORDER — GATIFLOXACIN 0.5 % OP SOLN
1.0000 [drp] | OPHTHALMIC | Status: AC | PRN
  Administered 2018-10-04 (×3): 1 [drp] via OPHTHALMIC

## 2018-10-04 SURGICAL SUPPLY — 65 items
APL SWBSTK 6 STRL LF DISP (MISCELLANEOUS) ×1
APPLICATOR COTTON TIP 6 STRL (MISCELLANEOUS) ×1 IMPLANT
APPLICATOR COTTON TIP 6IN STRL (MISCELLANEOUS) ×3
BLADE MVR KNIFE 20G (BLADE) ×2 IMPLANT
CANNULA ANT CHAM MAIN (OPHTHALMIC RELATED) ×2 IMPLANT
CANNULA DUAL BORE 23G (CANNULA) IMPLANT
CANNULA DUALBORE 25G (CANNULA) IMPLANT
CANNULA VLV SOFT TIP 25G (OPHTHALMIC) ×1 IMPLANT
CANNULA VLV SOFT TIP 25GA (OPHTHALMIC) ×3 IMPLANT
CAUTERY EYE LOW TEMP 1300F FIN (OPHTHALMIC RELATED) IMPLANT
CLOSURE STERI-STRIP 1/2X4 (GAUZE/BANDAGES/DRESSINGS) ×1
CLSR STERI-STRIP ANTIMIC 1/2X4 (GAUZE/BANDAGES/DRESSINGS) ×2 IMPLANT
CORD BIPOLAR FORCEPS 12FT (ELECTRODE) IMPLANT
COVER MAYO STAND STRL (DRAPES) ×2 IMPLANT
COVER WAND RF STERILE (DRAPES) ×3 IMPLANT
DRAPE HALF SHEET 40X57 (DRAPES) ×3 IMPLANT
DRAPE INCISE 51X51 W/FILM STRL (DRAPES) IMPLANT
DRAPE RETRACTOR (MISCELLANEOUS) ×3 IMPLANT
ERASER HMR WETFIELD 23G BP (MISCELLANEOUS) IMPLANT
FORCEPS ECKARDT ILM 25G SERR (OPHTHALMIC RELATED) IMPLANT
FORCEPS GRIESHABER ILM 25G A (INSTRUMENTS) IMPLANT
GAS AUTO FILL CONSTEL (OPHTHALMIC)
GAS AUTO FILL CONSTELLATION (OPHTHALMIC) IMPLANT
GLOVE SURG SYN 7.5  E (GLOVE) ×4
GLOVE SURG SYN 7.5 E (GLOVE) ×2 IMPLANT
GLOVE SURG SYN 7.5 PF PI (GLOVE) ×2 IMPLANT
GOWN STRL REUS W/ TWL LRG LVL3 (GOWN DISPOSABLE) ×1 IMPLANT
GOWN STRL REUS W/TWL LRG LVL3 (GOWN DISPOSABLE) ×3
HANDLE PNEUMATIC FOR CONSTEL (OPHTHALMIC) IMPLANT
KIT BASIN OR (CUSTOM PROCEDURE TRAY) ×3 IMPLANT
KIT TURNOVER KIT B (KITS) ×3 IMPLANT
LENS BIOM SUPER VIEW SET DISP (MISCELLANEOUS) ×3 IMPLANT
MICROPICK 25G (MISCELLANEOUS)
NDL 18GX1X1/2 (RX/OR ONLY) (NEEDLE) ×1 IMPLANT
NDL 25GX 5/8IN NON SAFETY (NEEDLE) ×1 IMPLANT
NDL FILTER BLUNT 18X1 1/2 (NEEDLE) ×3 IMPLANT
NDL HYPO 25GX1X1/2 BEV (NEEDLE) IMPLANT
NDL HYPO 30X.5 LL (NEEDLE) ×4 IMPLANT
NDL RETROBULBAR 25GX1.5 (NEEDLE) IMPLANT
NEEDLE 18GX1X1/2 (RX/OR ONLY) (NEEDLE) ×3 IMPLANT
NEEDLE 25GX 5/8IN NON SAFETY (NEEDLE) ×3 IMPLANT
NEEDLE FILTER BLUNT 18X 1/2SAF (NEEDLE) ×6
NEEDLE FILTER BLUNT 18X1 1/2 (NEEDLE) ×3 IMPLANT
NEEDLE HYPO 25GX1X1/2 BEV (NEEDLE) IMPLANT
NEEDLE HYPO 30X.5 LL (NEEDLE) ×12 IMPLANT
NEEDLE RETROBULBAR 25GX1.5 (NEEDLE) IMPLANT
NS IRRIG 1000ML POUR BTL (IV SOLUTION) ×3 IMPLANT
PACK VITRECTOMY CUSTOM (CUSTOM PROCEDURE TRAY) ×3 IMPLANT
PAD ARMBOARD 7.5X6 YLW CONV (MISCELLANEOUS) ×6 IMPLANT
PAK PIK VITRECTOMY CVS 25GA (OPHTHALMIC) ×3 IMPLANT
PENCIL BIPOLAR 25GA STR DISP (OPHTHALMIC RELATED) IMPLANT
PICK MICROPICK 25G (MISCELLANEOUS) IMPLANT
PROBE LASER ILLUM FLEX CVD 25G (OPHTHALMIC) IMPLANT
ROLLS DENTAL (MISCELLANEOUS) IMPLANT
SCRAPER DIAMOND 25GA (OPHTHALMIC RELATED) IMPLANT
SOL ANTI FOG 6CC (MISCELLANEOUS) ×1 IMPLANT
SOLUTION ANTI FOG 6CC (MISCELLANEOUS) ×2
STOPCOCK 4 WAY LG BORE MALE ST (IV SETS) IMPLANT
SUT VICRYL 7 0 TG140 8 (SUTURE) ×5 IMPLANT
SYR 10ML LL (SYRINGE) IMPLANT
SYR 20ML LL LF (SYRINGE) ×3 IMPLANT
SYR 5ML LL (SYRINGE) ×3 IMPLANT
SYR TB 1ML LUER SLIP (SYRINGE) ×9 IMPLANT
TOWEL GREEN STERILE FF (TOWEL DISPOSABLE) ×3 IMPLANT
WATER STERILE IRR 1000ML POUR (IV SOLUTION) ×3 IMPLANT

## 2018-10-04 NOTE — Progress Notes (Signed)
Report given to [place], [place] RN

## 2018-10-04 NOTE — Progress Notes (Signed)
Dr. Ermalene Postin made aware of BP.

## 2018-10-04 NOTE — Transfer of Care (Signed)
Immediate Anesthesia Transfer of Care Note  Patient: [name]  Procedure(s) Performed: PARS PLANA VITRECTOMY 25 GAUGE FOR ENDOPHTHALMITIS WITH INJECTION OF INTRAVITREAL ANTIBIOTIC (Left Eye) Insertion Of Gas (Left Eye) Gas/Fluid Exchange (Left Eye) Anterior Chamber Washout, Vitreous Tap (Left Eye)  Patient Location: PACU  Anesthesia Type:MAC  Level of Consciousness: awake, alert  and oriented  Airway & Oxygen Therapy: Patient Spontanous Breathing  Post-op Assessment: Report given to RN and Post -op Vital signs reviewed and stable  Post vital signs: Reviewed and stable  Last Vitals:  Vitals Value Taken Time  BP 177/83 10/04/18 2037  Temp    Pulse 75 10/04/18 2039  Resp 16 10/04/18 2039  SpO2 99 % 10/04/18 2039  Vitals shown include unvalidated device data.  Last Pain:  Vitals:   10/04/18 1747  TempSrc: Oral  PainSc: 4       Patients Stated Pain Goal: 4 (88/28/00 3491)  Complications: No apparent anesthesia complications

## 2018-10-04 NOTE — ED Notes (Signed)
ED Provider at bedside. 

## 2018-10-04 NOTE — Anesthesia Preprocedure Evaluation (Signed)
Anesthesia Evaluation   Patient awake    Reviewed: Allergy & Precautions, NPO status , reviewed documented beta blocker date and time , Unable to perform ROS - Chart review only  History of Anesthesia Complications Negative for: history of anesthetic complications  Airway Mallampati: II  TM Distance: >3 FB Neck ROM: Full    Dental  (+) Dental Advisory Given   Pulmonary neg pulmonary ROS,    breath sounds clear to auscultation       Cardiovascular hypertension, Pt. on medications and Pt. on home beta blockers + CAD, + Past MI, + Cardiac Stents, + CABG, +CHF and + DOE  + pacemaker + Valvular Problems/Murmurs AS  Rhythm:Regular     Neuro/Psych  Headaches,  Neuromuscular disease negative psych ROS   GI/Hepatic GERD  Medicated and Controlled,  Endo/Other  negative endocrine ROS  Renal/GU CRFRenal disease     Musculoskeletal  (+) Arthritis ,   Abdominal   Peds  Hematology  (+) Blood dyscrasia, anemia ,   Anesthesia Other Findings 1. The left ventricle has low normal systolic function, with an ejection fraction of 50-55%. The cavity size was normal. There is mildly increased left ventricular wall thickness. Left ventricular diastolic Doppler parameters are consistent with  impaired relaxation.  2. The right ventricle has normal systolic function. The cavity was normal. Right ventricular systolic pressure is mildly elevated with an estimated pressure of 47.0 mmHg.  3. Left atrial size was mildly dilated.  4. The mitral valve is abnormal. There is mild mitral annular calcification present. Mitral valve regurgitation is mild to moderate by color flow Doppler.  5. The aortic valve is tricuspid. Moderate calcification of the aortic valve. Aortic valve regurgitation is mild by color flow Doppler. Mild stenosis of the aortic valve.  6. The aorta is normal in size and structure.  7. Akinesis of the basal inferolateral wall with  overall preserved LV systolic function; mild diastolic dysfunction; mild LVH; calcified aortic valve with mild AS (mean gradient 11 mmHg) and mild AI; mild to moderate MR; mild LAE.  Reproductive/Obstetrics                             Anesthesia Physical Anesthesia Plan  ASA: III  Anesthesia Plan: MAC   Post-op Pain Management:    Induction: Intravenous  PONV Risk Score and Plan: 1 and Treatment may vary due to age or medical condition  Airway Management Planned: Nasal Cannula  Additional Equipment: None  Intra-op Plan:   Post-operative Plan:   Informed Consent: I have reviewed the patients History and Physical, chart, labs and discussed the procedure including the risks, benefits and alternatives for the proposed anesthesia with the patient or authorized representative who has indicated his/her understanding and acceptance.     Dental advisory given  Plan Discussed with: CRNA and Surgeon  Anesthesia Plan Comments:         Anesthesia Quick Evaluation

## 2018-10-04 NOTE — Brief Op Note (Signed)
10/04/2018  6:57 PM  PATIENT:  [name]  85 y.o. male  PRE-OPERATIVE DIAGNOSIS:  Endophthalmitis left eye  POST-OPERATIVE DIAGNOSIS:  Endophthalmitis left eye  PROCEDURE:  Procedure(s): PARS PLANA VITRECTOMY 25 GAUGE FOR ENDOPHTHALMITIS WITH INJECTION OF INTRAVITREAL ANTIBIOTIC (Left), ANTERIOR CHAMBER WASHOUT (Left), and VITREOUS TAP FOR CULTURE AND GRAM STAIN (Left)  SURGEON:  Surgeon(s) and Role:    * Jalene Mullet, MD - Primary  PHYSICIAN ASSISTANT:   ASSISTANTS: none   ANESTHESIA:   local and MAC  EBL:  minimal   BLOOD ADMINISTERED:none  DRAINS: none   LOCAL MEDICATIONS USED:  MARCAINE    and LIDOCAINE   SPECIMEN:  Source of Specimen:  Vitreous   DISPOSITION OF SPECIMEN:  PATHOLOGY - Gram Stain and Culture  COUNTS:  YES  TOURNIQUET:  * No tourniquets in log *  DICTATION: .Note written in EPIC  PLAN OF CARE: Discharge to home after PACU  PATIENT DISPOSITION:  PACU - hemodynamically stable.   Delay start of Pharmacological VTE agent (>24hrs) due to surgical blood loss or risk of bleeding: not applicable

## 2018-10-04 NOTE — ED Notes (Signed)
RIGHT EYE 20/20 LEFT EYE: cannot see the chart at all, states the bottom is dark and the top half is lighter, waved my arm and pt cannot see my arm waving

## 2018-10-04 NOTE — ED Triage Notes (Signed)
Pt presents with pain, blurred vision and swelling to Left eye. Pt reports he receives  Monthly injections x5 months for macular degeneration. States the first 4 injections with no complications and visual improvement. Wednesday he received a different medication Dr. Posey Pronto suggested and immediatly began having blurred vision, seen MD on Thursday given eye drops with some relief, Friday afternoon his vision started to get worse and complete blurred vision this am upon waking. Pt reports difficulty opening his Left eye this am associated with eye pain.

## 2018-10-04 NOTE — H&P (Deleted)
  The note originally documented on this encounter has been moved the the encounter in which it belongs.  

## 2018-10-04 NOTE — ED Provider Notes (Signed)
[facility] EMERGENCY DEPARTMENT Provider Note   CSN: [id number] Arrival date & time: 10/04/18  1205     History   Chief Complaint Chief Complaint  Patient presents with  . Eye Problem    HPI [name] is a 85 y.o. male.     HPI   89yM with blurred vision L eye and pain. Hx of macular degeneration. Had injection by Dr Posey Pronto this past Wednesday. Initially seemed ok. That evening developed blurred vision and pain in his eye. He treated it with compresses and saw Dr Posey Pronto Thursday. He was examined and given some eye drops that he has been using. The symptoms have continued to progress though and today says he can barely see anything out of L eye and having more pain.   Past Medical History:  Diagnosis Date  . Acute superficial venous thrombosis of lower extremity    a. RLE after CABG, neg dopp for DVT.  [name] Allergy   . Atrial fibrillation ([place])    a. Post-op from CABG, on amiodarone temporarily, d/c'd 12/2011  . CAD (coronary artery disease)    a. S/P stenting to mid RCA, prox PDA 06/1999. b. NSTEMI/CABG x 3 in 10/2011 with LIMA to LAD, SVG to PDA, and SVG to OM1.   . Chronic UTI    a. Followed by Dr. Risa Grill - colonized/asymptomatic - not on abx  . CKD (chronic kidney disease), stage III (HCC)    a. stable with a creatinine around 1.9-2.0 followed by nephrology.  . Dyslipidemia   . Echocardiogram    Echocardiogram 08/2018: EF 50-55, inf-lat AK, mild LVH, Gr 1 DD, RVSP 47, mild LAE, mild to mod MR, mild AI, mild AS (mean 11)  . GERD (gastroesophageal reflux disease)   . Hypertension    well-controlled.  . Myocardial infarction ([place])   . Neck injury    a. C3-C4 and C4-C5 foraminal narrowing, severe  . Prostate cancer ([place])    a. 2001 s/p TURP.  . Pulmonary nodule    a. felt to be noncancerous.  Status post followup CT scan 4 mm and stable.  . Renal artery stenosis ([place])    a. 50% by cath 2001  . 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 Weight loss 06/18/2018  . Myositis 06/18/2018  . 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 Elevated CK 05/12/2018  . Myoclonic jerking 02/10/2018  . Chronic diastolic CHF (congestive heart failure) ([place]) 02/05/2018  . Pain and swelling of toe of right foot 02/01/2018  . UGIB (upper gastrointestinal bleed) 02/01/2018  . Lumbar post-laminectomy syndrome 12/12/2017  . Pain of left hip joint 11/29/2017  . Trochanteric bursitis of left hip 11/29/2017  . Hyperpigmentation 11/04/2017  . Spinal stenosis of lumbar region 10/09/2017  . Abnormal appearance of skin 10/07/2017  . Pain in joint of right hip 09/26/2017  . Frequent urination 09/03/2017  . Gouty arthritis of right great toe 09/03/2017  . Postnasal drip 08/06/2017  . Rash and nonspecific skin eruption 08/06/2017  . Moderate aortic regurgitation 07/04/2017  . Diastolic dysfunction 96/75/9163  . Edema 06/29/2017  . DOE (dyspnea on exertion) 06/29/2017  . Sinus symptom 06/05/2017  . Fatigue 05/18/2017  . Sleeping difficulty 05/18/2017  . Foot pain, bilateral 04/22/2017  . Trochanteric bursitis 01/10/2017  . Internal hemorrhoids 08/15/2016  . Dysphagia 02/06/2016  . Cephalalgia 07/14/2015  .  Constipation 03/01/2015  . Myalgia 03/02/2014  . CHB (complete heart block) ([place]) 05/03/2012  . Postoperative atrial fibrillation ([place]) 05/03/2012  . Pacemaker 04/10/2012  . 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 Coronary atherosclerosis 11/27/2011  . Presence of aortocoronary bypass graft 10/24/2011  . Pulmonary nodule   . CAD (coronary artery disease)   . CKD (chronic kidney disease) stage 3, GFR 30-59 ml/min (HCC)   . Venous insufficiency of leg 06/05/2010  . SHOULDER PAIN, RIGHT, CHRONIC 10/14/2009  . B12 deficiency 08/23/2009  . Peripheral neuropathy 03/21/2009  . DEGENERATIVE DISC DISEASE, CERVICAL SPINE,  W/RADICULOPATHY 09/21/2008  . Dyslipidemia 01/17/2007  . Essential hypertension 07/20/2006  . Prostate cancer ([place]) 07/20/2006    Past Surgical History:  Procedure Laterality Date  . cardia catherization  07-07-99  . CARDIAC SURGERY  10/18/12   open heart surgery  . CATARACT EXTRACTION W/ INTRAOCULAR LENS  IMPLANT, BILATERAL  3/205, 06/2013   [facility]  . COLONOSCOPY  04/12/07  . CORONARY ARTERY BYPASS GRAFT  10/19/2011   Procedure: CORONARY ARTERY BYPASS GRAFTING (CABG);  Surgeon: Gaye Pollack, MD;  Location: [facility];  Service: Open Heart Surgery;  Laterality: N/A;  times three using Left Internal Mammary Artery and Right Greater S[place]hen[place] Vein Graft harvested Endoscopically  . edg  07-17-1994  . FLEXIBLE SIGMOIDOSCOPY  11-03-1997  . LEFT HEART CATHETERIZATION WITH CORONARY ANGIOGRAM N/A 10/16/2011   Procedure: LEFT HEART CATHETERIZATION WITH CORONARY ANGIOGRAM;  Surgeon: Peter M Martinique, MD;  Location: [facility] CATH LAB;  Service: Cardiovascular;  Laterality: N/A;  . LEFT HEART CATHETERIZATION WITH CORONARY/GRAFT ANGIOGRAM N/A 03/11/2013   Procedure: LEFT HEART CATHETERIZATION WITH [name];  Surgeon: Blane Ohara, MD;  Location: [facility] CATH LAB;  Service: Cardiovascular;  Laterality: N/A;  . lumbar spinal disk and neck fusion surgery    . PACEMAKER INSERTION  03/28/12   PPM implanted for mobitz II AV block  . peripheral vascular catherization  11-24-03  . PERMANENT PACEMAKER INSERTION N/A 03/28/2012   Procedure: PERMANENT PACEMAKER INSERTION;  Surgeon: Thompson Grayer, MD;  Location: [facility] CATH LAB;  Service: Cardiovascular;  Laterality: N/A;  . PROSTATECTOMY    . renal circulation  10-01-03  . s/p ptca    . stents     X 2  . stress cardiolite  05-04-05   spring 09-negative except for apical thinning, EF 68%        Home Medications    Prior to Admission medications   Medication Sig Start Date End Date Taking? Authorizing Provider  amLODipine (NORVASC) 2.5 MG tablet Take 1 tablet  (2.5 mg total) by mouth daily. 06/16/18 06/11/19  [name], MD  aspirin EC 81 MG tablet Take 81 mg by mouth daily.    [provider]  carboxymethylcellulose (REFRESH PLUS) 0.5 % SOLN Place 1 drop into both eyes daily as needed (dry eyes). 01/25/14   Rowe Clack, MD  Cyanocobalamin (VITAMIN B-12 IJ) Inject 1 mL as directed every 30 (thirty) days.    [provider]  epoetin alfa-epbx (RETACRIT) 2000 UNIT/ML injection 2,000 Units every 14 (fourteen) days.    [provider]  folic acid (FOLVITE) 1 MG tablet TAKE 1 TABLET DAILY 12/11/17   [name], MD  furosemide (LASIX) 40 MG tablet Take 1 tablet (40 mg total) by mouth as needed for fluid or edema. 02/05/18 05/06/18  Richardson Dopp T, PA-C  Multiple Vitamins-Minerals (PRESERVISION AREDS 2 PO) Take 1 tablet by mouth daily.  [provider]  nitroGLYCERIN (NITROSTAT) 0.4 MG SL tablet DISSOLVE 1 TABLET UNDER THE TONGUE EVERY 5 MINUTES AS NEEDED FOR CHEST PAIN (MAX OF 3 TABLETS) 11/28/17   Burns, Claudina Lick, MD  TOPROL XL 25 MG 24 hr tablet TAKE 1 TABLET AT BEDTIME (DOSE DECREASE) 04/21/18   [name], MD  triamcinolone cream (KENALOG) 0.1 % Apply 1 application topically as needed.    [provider]  vitamin C (ASCORBIC ACID) 500 MG tablet Take 1,000 mg by mouth daily.     [provider]  Vitamin D, Ergocalciferol, (DRISDOL) 1.25 MG (50000 UT) CAPS capsule Take 50,000 Units by mouth every 30 (thirty) days.     [provider]  Zinc 50 MG CAPS Take by mouth.    [provider]    Family History Family History  Problem Relation Age of Onset  . Coronary artery disease Father        died @ 27  . Other Mother        cerebral hemorrhage - died @ 69  . Arthritis Mother   . Cancer Brother        Bladder  . Prostate cancer Brother   . Nephritis Brother        died @ age 56.  . Other Brother        cerebral hemorrhage - died @ 32  . Heart disease Brother   .  Breast cancer Other        niece x 2  . Diabetes Neg Hx   . Colon cancer Neg Hx   . Adrenal disorder Neg Hx     Social History Social History   Tobacco Use  . Smoking status: Never Smoker  . Smokeless tobacco: Never Used  Substance Use Topics  . Alcohol use: Yes    Comment: Rarely  . Drug use: No     Allergies   Aspirin, Amoxicillin, Atarax [hydroxyzine hcl], Cephalexin, Ciprofloxacin, Clindamycin, Clobetasol, Codeine, Fluarix [flu virus vaccine], Haemophilus influenzae, Hydrocodone, Hydrocodone-acetaminophen, Hydroxyzine, Latex, Lisinopril, Macrobid [nitrofurantoin macrocrystal], Niacin, Niacin-lovastatin er, Niacin-lovastatin er, Nitrofurantoin, Vibramycin  [doxycycline], Bactrim [sulfamethoxazole-trimethoprim], Colchicine, and Gabapentin   Review of Systems Review of Systems All systems reviewed and negative, other than as noted in HPI.   Physical Exam Updated Vital Signs BP (!) 157/76 (BP Location: Left Arm)   Pulse 71   Temp 98.3 F (36.8 C) (Oral)   Resp 18   Ht 5\' 11"  (1.803 m)   Wt 69.4 kg   SpO2 100%   BMI 21.34 kg/m   Physical Exam Vitals signs and nursing note reviewed.  Constitutional:      General: He is not in acute distress.    Appearance: He is well-developed.  HENT:     Head: Normocephalic and atraumatic.  Eyes:     General:        Right eye: No discharge.     Extraocular Movements: Extraocular movements intact.     Conjunctiva/sclera: Conjunctivae normal.     Comments: L eyelid and periorbital tissues grossly normal. Mild conjunctival injection. Small hypopyon? Retina looks very blurry. Vision R eye 20/20. In L eye can only discriminate the outline of large objects, light/dark, motion.   Neck:     Musculoskeletal: Neck supple.  Cardiovascular:     Rate and Rhythm: Normal rate and regular rhythm.     Heart sounds: Normal heart sounds. No murmur. No friction rub. No gallop.   Pulmonary:     Effort: Pulmonary effort is  normal. No respiratory  distress.     Breath sounds: Normal breath sounds.  Abdominal:     General: There is no distension.     Palpations: Abdomen is soft.     Tenderness: There is no abdominal tenderness.  Musculoskeletal:        General: No tenderness.  Skin:    General: Skin is warm and dry.  Neurological:     Mental Status: He is alert.  Psychiatric:        Behavior: Behavior normal.        Thought Content: Thought content normal.      ED Treatments / Results  Labs (all labs ordered are listed, but only abnormal results are displayed) Labs Reviewed - No data to display  EKG None  Radiology No results found.  Procedures Procedures (including critical care time)  Medications Ordered in ED Medications - No data to display   Initial Impression / Assessment and Plan / ED Course  I have reviewed the triage vital signs and the nursing notes.  Pertinent labs & imaging results that were available during my care of the patient were reviewed by me and considered in my medical decision making (see chart for details).       89yM with L eye pain and blurred vision. I'm concerned for possible endophthalmitis. Ophthalmology consultation.   Discussed with Dr Talbert Forest. He is going to meet the patient in his office across in the street at 3:45 today. Pt/wife comfortable with this plan.   Final Clinical Impressions(s) / ED Diagnoses   Final diagnoses:  Endophthalmitis, acute, left    ED Discharge Orders    None       Virgel Manifold, MD 10/05/18 [phone]

## 2018-10-04 NOTE — H&P (Signed)
Date of examination:  0926/20  Indication for surgery: Endopthalmitis left eye  Pertinent past medical history:  Past Medical History:  Diagnosis Date  . Acute superficial venous thrombosis of lower extremity    a. RLE after CABG, neg dopp for DVT.  [name] Allergy   . Atrial fibrillation ([place])    a. Post-op from CABG, on amiodarone temporarily, d/c'd 12/2011  . CAD (coronary artery disease)    a. S/P stenting to mid RCA, prox PDA 06/1999. b. NSTEMI/CABG x 3 in 10/2011 with LIMA to LAD, SVG to PDA, and SVG to OM1.   . Chronic UTI    a. Followed by Dr. Risa Grill - colonized/asymptomatic - not on abx  . CKD (chronic kidney disease), stage III (HCC)    a. stable with a creatinine around 1.9-2.0 followed by nephrology.  . Dyslipidemia   . Echocardiogram    Echocardiogram 08/2018: EF 50-55, inf-lat AK, mild LVH, Gr 1 DD, RVSP 47, mild LAE, mild to mod MR, mild AI, mild AS (mean 11)  . GERD (gastroesophageal reflux disease)   . Hypertension    well-controlled.  . Myocardial infarction ([place])   . Neck injury    a. C3-C4 and C4-C5 foraminal narrowing, severe  . Prostate cancer ([place])    a. 2001 s/p TURP.  . Pulmonary nodule    a. felt to be noncancerous.  Status post followup CT scan 4 mm and stable.  . Renal artery stenosis ([place])    a. 50% by cath 2001  . 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    Pertinent ocular history:  Decreased vision left eye  Pertinent family history:  Family History  Problem Relation Age of Onset  . Coronary artery disease Father        died @ 54  . Other Mother        cerebral hemorrhage - died @ 3  . Arthritis Mother   . Cancer Brother        Bladder  . Prostate cancer Brother   . Nephritis Brother        died @ age 1.  . Other Brother        cerebral hemorrhage - died @ 28  . Heart disease Brother   . Breast cancer Other        niece x 2  . Diabetes Neg Hx   . Colon cancer Neg Hx   . Adrenal disorder Neg Hx      General:  Healthy appearing patient in no distress.    Eyes:    Acuity  OS HM    External: Within normal limits   Anterior segment: Hypopyon OS    Fundus: Vitiritis    Impression:Endophthalmitis left eye  Plan: Vitrectomy with injection of intravitreal antibiotics left eye  Royston Cowper

## 2018-10-04 NOTE — Discharge Instructions (Addendum)
Dr Talbert Forest wants to see you in his office today. He should be able to see you around 3:45-4:00 pm. He is located at [address]. His office is in the large brick office building right across [facility] from [facility].

## 2018-10-04 NOTE — Discharge Instructions (Signed)
DO NOT SLEEP ON BACK, THE EYE PRESSURE CAN GO UP AND CAUSE VISION LOSS   SLEEP ON SIDE WITH NOSE TO PILLOW  DURING DAY KEEP UPRIGHT  CALL DR. Camas IF QUESTIONS/CONCERNS [phone]

## 2018-10-05 LAB — GRAM STAIN

## 2018-10-06 ENCOUNTER — Encounter (HOSPITAL_COMMUNITY): Payer: Self-pay | Admitting: Ophthalmology

## 2018-10-07 LAB — EYE CULTURE: Gram Stain: NONE SEEN

## 2018-10-08 NOTE — Anesthesia Postprocedure Evaluation (Signed)
Anesthesia Post Note  Patient: [name]  Procedure(s) Performed: PARS PLANA VITRECTOMY 25 GAUGE FOR ENDOPHTHALMITIS WITH INJECTION OF INTRAVITREAL ANTIBIOTIC (Left Eye) Insertion Of Gas (Left Eye) Gas/Fluid Exchange (Left Eye) Anterior Chamber Washout, Vitreous Tap (Left Eye)     Patient location during evaluation: PACU Anesthesia Type: MAC Level of consciousness: awake and alert Pain management: pain level controlled Vital Signs Assessment: post-procedure vital signs reviewed and stable Respiratory status: spontaneous breathing, nonlabored ventilation, respiratory function stable and patient connected to nasal cannula oxygen Cardiovascular status: stable and blood pressure returned to baseline Postop Assessment: no apparent nausea or vomiting Anesthetic complications: no    Last Vitals:  Vitals:   10/04/18 2122 10/04/18 2137  BP: (!) 142/62 (!) 147/63  Pulse: 72 70  Resp: 20 19  Temp:  36.7 C  SpO2: 96% 95%    Last Pain:  Vitals:   10/04/18 2137  TempSrc:   PainSc: 0-No pain                 Indiana Pechacek

## 2018-10-09 LAB — AEROBIC/ANAEROBIC CULTURE W GRAM STAIN (SURGICAL/DEEP WOUND)
Gram Stain: NONE SEEN
Gram Stain: NONE SEEN

## 2018-10-14 ENCOUNTER — Ambulatory Visit (INDEPENDENT_AMBULATORY_CARE_PROVIDER_SITE_OTHER): Payer: Medicare Other | Admitting: *Deleted

## 2018-10-14 DIAGNOSIS — I5032 Chronic diastolic (congestive) heart failure: Secondary | ICD-10-CM

## 2018-10-14 DIAGNOSIS — I442 Atrioventricular block, complete: Secondary | ICD-10-CM

## 2018-10-15 LAB — CUP PACEART REMOTE DEVICE CHECK
Battery Impedance: 477 Ohm
Battery Remaining Longevity: 95 mo
Battery Voltage: 2.79 V
Brady Statistic AP VP Percent: 0 %
Brady Statistic AP VS Percent: 41 %
Brady Statistic AS VP Percent: 0 %
Brady Statistic AS VS Percent: 58 %
Date Time Interrogation Session: 20201006115112
Implantable Lead Implant Date: 20140321
Implantable Lead Implant Date: 20140321
Implantable Lead Location: 753859
Implantable Lead Location: 753860
Implantable Lead Model: 5076
Implantable Lead Model: 5092
Implantable Pulse Generator Implant Date: 20140321
Lead Channel Impedance Value: 417 Ohm
Lead Channel Impedance Value: 702 Ohm
Lead Channel Pacing Threshold Amplitude: 0.625 V
Lead Channel Pacing Threshold Amplitude: 0.875 V
Lead Channel Pacing Threshold Pulse Width: 0.4 ms
Lead Channel Pacing Threshold Pulse Width: 0.4 ms
Lead Channel Setting Pacing Amplitude: 2 V
Lead Channel Setting Pacing Amplitude: 2.5 V
Lead Channel Setting Pacing Pulse Width: 0.4 ms
Lead Channel Setting Sensing Sensitivity: 5.6 mV

## 2018-10-16 ENCOUNTER — Encounter (HOSPITAL_COMMUNITY)
Admission: RE | Admit: 2018-10-16 | Discharge: 2018-10-16 | Disposition: A | Discharge status: Home / Self Care | Payer: Medicare Other | Source: Ambulatory Visit | Attending: Nephrology | Admitting: Nephrology

## 2018-10-16 ENCOUNTER — Other Ambulatory Visit: Payer: Self-pay

## 2018-10-16 DIAGNOSIS — D631 Anemia in chronic kidney disease: Secondary | ICD-10-CM | POA: Diagnosis not present

## 2018-10-16 DIAGNOSIS — N183 Chronic kidney disease, stage 3 unspecified: Secondary | ICD-10-CM | POA: Insufficient documentation

## 2018-10-16 LAB — IRON AND TIBC
Iron: 30 ug/dL — ABNORMAL LOW (ref 45–182)
Saturation Ratios: 8 % — ABNORMAL LOW (ref 17.9–39.5)
TIBC: 361 ug/dL (ref 250–450)
UIBC: 331 ug/dL

## 2018-10-16 LAB — FERRITIN: Ferritin: 14 ng/mL — ABNORMAL LOW (ref 24–336)

## 2018-10-16 LAB — POCT HEMOGLOBIN-HEMACUE: Hemoglobin: 11 g/dL — ABNORMAL LOW (ref 13.0–17.0)

## 2018-10-16 MED ORDER — EPOETIN ALFA-EPBX 10000 UNIT/ML IJ SOLN
20000.0000 [IU] | INTRAMUSCULAR | Status: DC
  Administered 2018-10-16: 20000 [IU] via SUBCUTANEOUS
  Filled 2018-10-16: qty 2

## 2018-10-20 ENCOUNTER — Other Ambulatory Visit: Payer: Self-pay

## 2018-10-20 ENCOUNTER — Ambulatory Visit (INDEPENDENT_AMBULATORY_CARE_PROVIDER_SITE_OTHER): Payer: Medicare Other

## 2018-10-20 DIAGNOSIS — E538 Deficiency of other specified B group vitamins: Secondary | ICD-10-CM | POA: Diagnosis not present

## 2018-10-20 MED ORDER — CYANOCOBALAMIN 1000 MCG/ML IJ SOLN
1000.0000 ug | Freq: Once | INTRAMUSCULAR | Status: AC
  Administered 2018-10-20: 1000 ug via INTRAMUSCULAR

## 2018-10-20 NOTE — Progress Notes (Signed)
b12 Injection given.   Raylen Tangonan J Noeh Sparacino, MD  

## 2018-10-21 NOTE — Op Note (Signed)
[place] Corella Odaniel 10/21/2018 Diagnosis: Endophthalmitis left eye  Procedure: [place] Vitrectomy, injection of intravitreal antibiotics and vitreous tap for culture and gram stain left eye Operative Eye:  left eye  Surgeon: Royston Cowper Estimated Blood Loss: minimal Specimens for Pathology:  None Complications: none   The  patient was prepped and draped in the usual fashion for ocular surgery on the  left eye .  A lid speculum was placed.  Infusion line and trocar was placed at the 4 o'clock position approximately 3.5 mm from the surgical limbus.   The infusion line was allowed to run and then clamped when placed at the cannula opening. The line was inserted and secured to the drape with an adhesive strip.   Active trocars/cannula were placed at the 10 and 2 o'clock positions approximately 3.5 mm from the surgical limbus. The cannula was visualized in the vitreous cavity.  The light pipe and vitreous cutter were inserted into the vitreous cavity.  A dry tap was performed with the vitrector and infusion turned off.   The sample obtained in a 3 cc syringe was prepped on microscope slide and plated intraop for gram stain and culture. The infusion was restarted and the  and a core vitrectomy was performed.  Care taken to remove the vitreous up to the vitreous base for 360 degrees.  There was notable debris from endopthalmitis noted.  Care was taken to remove the vitreous to the vitreous base.    A partial air-fluid exchange was performed.  Intravitreal antibiotics were placed in the eye.  1mg  Vancomycin in 0.6ml and 2.25 mg Ceftazadime in 0.80ml.    The superior cannulas were sequentially removed with concommitant tamponade using a cotton tipped applicator and noted to be air tight.  The infusion line and trocar were removed and the sclerotomy was noted to be air tight with normal intraocular pressure by digital palpapation.  Subconjunctival injections of  Vancomycin, Ceftazadime, and  Dexamethasone 4mg /33ml was placed in the infero-medial quadrant.   The speculum and drapes were removed and the eye was patched with Polymixin/Bacitracin ophthalmic ointment. An eye shield was placed and the patient was transferred alert and conversant with stable vital signs to the post operative recovery area.  The patient tolerated the procedure well and no complications were noted.  Royston Cowper MD

## 2018-10-24 NOTE — Progress Notes (Signed)
Remote pacemaker transmission.   

## 2018-10-25 LAB — CULTURE, FUNGUS WITHOUT SMEAR

## 2018-10-30 ENCOUNTER — Ambulatory Visit (HOSPITAL_COMMUNITY)
Admission: RE | Admit: 2018-10-30 | Discharge: 2018-10-30 | Disposition: A | Discharge status: Home / Self Care | Payer: Medicare Other | Source: Ambulatory Visit | Attending: Nephrology | Admitting: Nephrology

## 2018-10-30 ENCOUNTER — Other Ambulatory Visit: Payer: Self-pay

## 2018-10-30 DIAGNOSIS — N183 Chronic kidney disease, stage 3 unspecified: Secondary | ICD-10-CM | POA: Diagnosis present

## 2018-10-30 DIAGNOSIS — D631 Anemia in chronic kidney disease: Secondary | ICD-10-CM | POA: Insufficient documentation

## 2018-10-30 LAB — POCT HEMOGLOBIN-HEMACUE: Hemoglobin: 11.2 g/dL — ABNORMAL LOW (ref 13.0–17.0)

## 2018-10-30 MED ORDER — EPOETIN ALFA-EPBX 10000 UNIT/ML IJ SOLN
20000.0000 [IU] | INTRAMUSCULAR | Status: DC
  Administered 2018-10-30: 20000 [IU] via SUBCUTANEOUS
  Filled 2018-10-30: qty 2

## 2018-11-03 LAB — FUNGUS CULTURE WITH STAIN

## 2018-11-03 LAB — FUNGAL ORGANISM REFLEX

## 2018-11-03 LAB — FUNGUS CULTURE RESULT

## 2018-11-13 ENCOUNTER — Other Ambulatory Visit: Payer: Self-pay

## 2018-11-13 ENCOUNTER — Ambulatory Visit (HOSPITAL_COMMUNITY)
Admission: RE | Admit: 2018-11-13 | Discharge: 2018-11-13 | Disposition: A | Discharge status: Home / Self Care | Payer: Medicare Other | Source: Ambulatory Visit | Attending: Nephrology | Admitting: Nephrology

## 2018-11-13 DIAGNOSIS — D631 Anemia in chronic kidney disease: Secondary | ICD-10-CM | POA: Insufficient documentation

## 2018-11-13 DIAGNOSIS — N183 Chronic kidney disease, stage 3 unspecified: Secondary | ICD-10-CM | POA: Diagnosis present

## 2018-11-13 LAB — IRON AND TIBC
Iron: 33 ug/dL — ABNORMAL LOW (ref 45–182)
Saturation Ratios: 9 % — ABNORMAL LOW (ref 17.9–39.5)
TIBC: 349 ug/dL (ref 250–450)
UIBC: 316 ug/dL

## 2018-11-13 LAB — FERRITIN: Ferritin: 17 ng/mL — ABNORMAL LOW (ref 24–336)

## 2018-11-13 LAB — POCT HEMOGLOBIN-HEMACUE: Hemoglobin: 10.6 g/dL — ABNORMAL LOW (ref 13.0–17.0)

## 2018-11-13 MED ORDER — EPOETIN ALFA-EPBX 10000 UNIT/ML IJ SOLN
20000.0000 [IU] | INTRAMUSCULAR | Status: DC
  Administered 2018-11-13: 20000 [IU] via SUBCUTANEOUS
  Filled 2018-11-13: qty 2

## 2018-11-20 ENCOUNTER — Ambulatory Visit (INDEPENDENT_AMBULATORY_CARE_PROVIDER_SITE_OTHER): Payer: Medicare Other

## 2018-11-20 ENCOUNTER — Other Ambulatory Visit: Payer: Self-pay

## 2018-11-20 DIAGNOSIS — E538 Deficiency of other specified B group vitamins: Secondary | ICD-10-CM | POA: Diagnosis not present

## 2018-11-20 MED ORDER — CYANOCOBALAMIN 1000 MCG/ML IJ SOLN
1000.0000 ug | Freq: Once | INTRAMUSCULAR | Status: AC
  Administered 2018-11-20: 1000 ug via INTRAMUSCULAR

## 2018-11-20 NOTE — Progress Notes (Signed)
b12 Injection given.   [name] J Corin Formisano, MD  

## 2018-11-25 ENCOUNTER — Ambulatory Visit: Payer: Self-pay | Admitting: *Deleted

## 2018-11-25 NOTE — Telephone Encounter (Addendum)
Pt called with complaints of his tongue being cracked down middle for the past 5 days; he states it has been like this since last Thursday, and it feels like it is cracked; he says that everything is dry, and it is hard to swallow due to the above; he also says that he would like to discuss ongoing issues since last year, and he has lost 22 lbs since Sept 2019; he is also concerned about his BM, he stool is "dry in the beginning but moist in the end"; the pt sees Dr Quay Burow, [place]; pt transferred to [facility] for scheduling.  Reason for Disposition . Requesting regular office appointment  Answer Assessment - Initial Assessment Questions 1. REASON FOR CALL or QUESTION: "What is your reason for calling today?" or "How can I best help you?" or "What question do you have that I can help answer?"     Appointment to discuss multiple issues  Protocols used: [place]

## 2018-11-25 NOTE — Telephone Encounter (Signed)
Please make a 30 min appt

## 2018-11-26 NOTE — Telephone Encounter (Signed)
Seeing you Friday at 11:00

## 2018-11-27 ENCOUNTER — Ambulatory Visit (HOSPITAL_COMMUNITY)
Admission: RE | Admit: 2018-11-27 | Discharge: 2018-11-27 | Disposition: A | Discharge status: Home / Self Care | Payer: Medicare Other | Source: Ambulatory Visit | Attending: Nephrology | Admitting: Nephrology

## 2018-11-27 ENCOUNTER — Other Ambulatory Visit: Payer: Self-pay

## 2018-11-27 VITALS — BP 164/79 | HR 85 | Resp 20

## 2018-11-27 DIAGNOSIS — N183 Chronic kidney disease, stage 3 unspecified: Secondary | ICD-10-CM | POA: Insufficient documentation

## 2018-11-27 LAB — POCT HEMOGLOBIN-HEMACUE: Hemoglobin: 10.7 g/dL — ABNORMAL LOW (ref 13.0–17.0)

## 2018-11-27 MED ORDER — EPOETIN ALFA-EPBX 10000 UNIT/ML IJ SOLN
20000.0000 [IU] | INTRAMUSCULAR | Status: DC
  Administered 2018-11-27: 20000 [IU] via SUBCUTANEOUS
  Filled 2018-11-27: qty 2

## 2018-11-27 NOTE — Progress Notes (Signed)
Subjective:    Patient ID: [name], male    DOB: 1926/04/18, 85 y.o.   MRN: [id number]  HPI The patient is here for follow up.   He has been exercising - 35-45 minutes of exercise and walking inside the house.  He does tire with certain activities and become short of breath.  He is not able to help around the house much because of this.  Dry Mouth:  His tongue has looked like it was trying to split.  He has been using mouth wash and it has helped.  He breathes through his mouth at night.  He has nasal congestion and can not breath through his nose even with nasal sprays.   He has difficulty swallowing larger pills. He sometimes has difficulty swallowing food. He plans on seeing ENT.  He denies any reflux.    He has the urge to have a BM and he sits and it may be several minutes before he has a BM, which is balls.  He has some medication approved by his nephrologist that works and will start that.    Insomnia:  He is taking melatonin and it has helped, but is no longer working as well.    Endophthalmitis:  Went to ED 9/26 for L eye pain and blurry vision  a few days after having an injection for his macular degeneration.  He had a vitrectomy for this with an injection of intravitreal antibiotic on 9/26.  His vision is sometimes good and other times bad.    Iron def with chronic anemia, CKD:   He receives erythropoietin weekly.  He sees Dr Justin Mend for his CKD. He had an EGD in 01/2018 that showed gastritis, but no source of blood loss.   Overall he feels he is getting weaker.  He furniture surfs at home.  His legs feel weak.  His b/l lower legs feel like they are going to sleep.  He uses a cane outside of his house.  He does have neuropathy in his feet.  He has been getting injections in his hips and that has helped.  He follows with orthopedics.  He is unsure what is causing the weakness in his legs-if it is neurological or orthopedic in nature.   He has wondered if he should go  to [facility] to help figure out what is wrong with him.  Medications and allergies reviewed with patient and updated if appropriate.  Patient Active Problem List   Diagnosis Date Noted   Weight loss 06/18/2018   Myositis 06/18/2018   Gout due to renal impairment involving toe 05/12/2018   Elevated CK 05/12/2018   Myoclonic jerking 02/10/2018   Chronic diastolic CHF (congestive heart failure) ([place]) 02/05/2018   Pain and swelling of toe of right foot 02/01/2018   UGIB (upper gastrointestinal bleed) 02/01/2018   Lumbar post-laminectomy syndrome 12/12/2017   Pain of left hip joint 11/29/2017   Trochanteric bursitis of left hip 11/29/2017   Hyperpigmentation 11/04/2017   Spinal stenosis of lumbar region 10/09/2017   Abnormal appearance of skin 10/07/2017   Pain in joint of right hip 09/26/2017   Frequent urination 09/03/2017   Gouty arthritis of right great toe 09/03/2017   Postnasal drip 08/06/2017   Rash and nonspecific skin eruption 08/06/2017   Moderate aortic regurgitation 95/28/4132   Diastolic dysfunction 44/01/270   Edema 06/29/2017   DOE (dyspnea on exertion) 06/29/2017   Sinus symptom 06/05/2017   Fatigue 05/18/2017  Sleeping difficulty 05/18/2017   Foot pain, bilateral 04/22/2017   Trochanteric bursitis 01/10/2017   Internal hemorrhoids 08/15/2016   Dysphagia 02/06/2016   Cephalalgia 07/14/2015   Constipation 03/01/2015   Myalgia 03/02/2014   CHB (complete heart block) ([place]) 05/03/2012   Postoperative atrial fibrillation ([place]) 05/03/2012   Pacemaker 04/10/2012   AV block, 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 03/26/2012   Coronary atherosclerosis 11/27/2011   Presence of aortocoronary bypass graft 10/24/2011   Pulmonary nodule    CAD (coronary artery disease)    CKD (chronic kidney disease) stage 3, GFR 30-59 ml/min (HCC)    Venous insufficiency of leg 06/05/2010   SHOULDER PAIN, RIGHT,  CHRONIC 10/14/2009   B12 deficiency 08/23/2009   Peripheral neuropathy 03/21/2009   DEGENERATIVE [place] DISEASE, CERVICAL SPINE, W/RADICULOPATHY 09/21/2008   Dyslipidemia 01/17/2007   Essential hypertension 07/20/2006   Prostate cancer ([place]) 07/20/2006    Current Outpatient Medications on File Prior to Visit  Medication Sig Dispense Refill   amLODipine (NORVASC) 2.5 MG tablet Take 1 tablet (2.5 mg total) by mouth daily. 90 tablet 3   Aromatic Inhalants (VICKS VAPOR INHALER IN) Place 1 puff into both nostrils as needed (for congestion).     aspirin EC 81 MG tablet Take 81 mg by mouth daily.     Carboxymeth-Glycerin-Polysorb (REFRESH OPTIVE MEGA-3 OP) Place 1 drop into both eyes 2 (two) times daily.     Cyanocobalamin (VITAMIN B-12 IJ) Inject 1 mL as directed every 30 (thirty) days.     epoetin alfa-epbx (RETACRIT) 2000 UNIT/ML injection 2,000 Units every 14 (fourteen) days.     furosemide (LASIX) 40 MG tablet Take 1 tablet (40 mg total) by mouth as needed for fluid or edema. 90 tablet 3   loratadine (CLARITIN) 10 MG tablet Take 10 mg by mouth daily as needed (for seasonal allergies).     Multiple Vitamins-Minerals (PRESERVISION AREDS 2 PO) Take 1 capsule by mouth 2 (two) times daily.      neomycin-polymyxin-dexamethasone (MAXITROL) 0.1 % ophthalmic suspension 1 drop daily as needed.     nitroGLYCERIN (NITROSTAT) 0.4 MG SL tablet DISSOLVE 1 TABLET UNDER THE TONGUE EVERY 5 MINUTES AS NEEDED FOR CHEST PAIN (MAX OF 3 TABLETS) (Patient taking differently: Place 0.4 mg under the tongue every 5 (five) minutes x 3 doses as needed for chest pain. ) 100 tablet 4   TOPROL XL 25 MG 24 hr tablet TAKE 1 TABLET AT BEDTIME (DOSE DECREASE) (Patient taking differently: Take 25 mg by mouth every evening. ) 90 tablet 3   triamcinolone cream (KENALOG) 0.1 % Apply 1 application topically as needed (for itching- to affected sites).      vitamin C (ASCORBIC ACID) 500 MG tablet Take 1,000 mg by mouth  daily.      Vitamin D, Ergocalciferol, (DRISDOL) 1.25 MG (50000 UT) CAPS capsule Take 50,000 Units by mouth every 30 (thirty) days.      Zinc 50 MG CAPS Take 50 mg by mouth at bedtime.      No current facility-administered medications on file prior to visit.     Past Medical History:  Diagnosis Date   Acute superficial venous thrombosis of lower extremity    a. RLE after CABG, neg dopp for DVT.   Allergy    Anemia    Atrial fibrillation ([place])    a. Post-op from CABG, on amiodarone temporarily, d/c'd 12/2011   CAD (coronary artery disease)    a. S/P stenting to mid RCA, prox PDA 06/1999. b. NSTEMI/CABG  x 3 in 10/2011 with LIMA to LAD, SVG to PDA, and SVG to OM1.    Chronic UTI    a. Followed by Dr. Risa Grill - colonized/asymptomatic - not on abx   CKD (chronic kidney disease), stage III    a. stable with a creatinine around 1.9-2.0 followed by nephrology.   Dyslipidemia    Echocardiogram    Echocardiogram 08/2018: EF 50-55, inf-lat AK, mild LVH, Gr 1 DD, RVSP 47, mild LAE, mild to mod MR, mild AI, mild AS (mean 11)   GERD (gastroesophageal reflux disease)    Hypertension    well-controlled.   Myocardial infarction ([place])    Neck injury    a. C3-C4 and C4-C5 foraminal narrowing, severe   Prostate cancer ([place])    a. 2001 s/p TURP.   Pulmonary nodule    a. felt to be noncancerous.  Status post followup CT scan 4 mm and stable.   Renal artery stenosis (HCC)    a. 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    Past Surgical History:  Procedure Laterality Date   ANTERIOR CHAMBER WASHOUT Left 10/04/2018   Procedure: Anterior Chamber Washout, Vitreous Tap;  Surgeon: Jalene Mullet, MD;  Location: [facility];  Service: Ophthalmology;  Laterality: Left;   cardia catherization  07-07-99   CARDIAC SURGERY  10/18/12   open heart surgery   CATARACT EXTRACTION W/ INTRAOCULAR LENS  IMPLANT, BILATERAL  3/205, 06/2013    [facility]   COLONOSCOPY  04/12/07   CORONARY ARTERY BYPASS GRAFT  10/19/2011   Procedure: CORONARY ARTERY BYPASS GRAFTING (CABG);  Surgeon: Gaye Pollack, MD;  Location: [facility];  Service: Open Heart Surgery;  Laterality: N/A;  times three using Left Internal Mammary Artery and Right Greater [place]hen[place] Vein Graft harvested Endoscopically   edg  07-17-1994   FLEXIBLE SIGMOIDOSCOPY  11-03-1997   GAS INSERTION Left 10/04/2018   Procedure: Insertion Of Gas;  Surgeon: Jalene Mullet, MD;  Location: [facility];  Service: Ophthalmology;  Laterality: Left;   GAS/FLUID EXCHANGE Left 10/04/2018   Procedure: Gas/Fluid Exchange;  Surgeon: Jalene Mullet, MD;  Location: [facility];  Service: Ophthalmology;  Laterality: Left;   LEFT HEART CATHETERIZATION WITH CORONARY ANGIOGRAM N/A 10/16/2011   Procedure: LEFT HEART CATHETERIZATION WITH CORONARY ANGIOGRAM;  Surgeon: Peter M Martinique, MD;  Location: [facility] CATH LAB;  Service: Cardiovascular;  Laterality: N/A;   LEFT HEART CATHETERIZATION WITH CORONARY/GRAFT ANGIOGRAM N/A 03/11/2013   Procedure: LEFT HEART CATHETERIZATION WITH [name];  Surgeon: Blane Ohara, MD;  Location: [facility] CATH LAB;  Service: Cardiovascular;  Laterality: N/A;   lumbar spinal disk and neck fusion surgery     PACEMAKER INSERTION  03/28/12   PPM implanted for mobitz II AV block   PARS PLANA VITRECTOMY Left 10/04/2018   Procedure: PARS PLANA VITRECTOMY 25 GAUGE FOR ENDOPHTHALMITIS WITH INJECTION OF INTRAVITREAL ANTIBIOTIC;  Surgeon: Jalene Mullet, MD;  Location: [facility];  Service: Ophthalmology;  Laterality: Left;   peripheral vascular catherization  11-24-03   PERMANENT PACEMAKER INSERTION N/A 03/28/2012   Procedure: PERMANENT PACEMAKER INSERTION;  Surgeon: Thompson Grayer, MD;  Location: [facility] CATH LAB;  Service: Cardiovascular;  Laterality: N/A;   PROSTATECTOMY     renal circulation  10-01-03   s/p ptca     stents     X 2   stress cardiolite  05-04-05   spring 09-negative except for  apical thinning, EF 68%    Social History  Socioeconomic History   Marital status: Married    Spouse name: [name]   Number of children: 2   Years of education: Not on file   Highest education level: Not on file  Occupational History   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   Occupation: Social research officer, 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; 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  Social Designer, fashion/clothing strain: Not on file   Food insecurity    Worry: Not on file    Inability: Not on Lexicographer needs    Medical: Not on file    Non-medical: Not on file  Tobacco Use   Smoking status: Never Smoker   Smokeless tobacco: Never Used  Substance and Sexual Activity   Alcohol use: Yes    Comment: Rarely   Drug use: No   Sexual activity: Not on file  Lifestyle   Physical activity    Days per week: Not on file    Minutes per session: Not on file   Stress: Not on file  Relationships   Social connections    Talks on phone: Not on file    Gets together: Not on file    Attends religious service: Not on file    Active member of club or organization: Not on file    Attends meetings of clubs or organizations: Not on file    Relationship status: Not on file  Other Topics Concern   Not on file  Social History Narrative   HSG, 1 year college.  married '52 - 3 years, divorced; married '56 - 3 years divorced; married '72-12 yrs - divorced; married '75 -. 1 son '57; 1 daughter - '53; 1 grandchild.  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  Very happily married.  End of life care: yes CPR, no long term mechanical ventilation, no heroic measures.    Family History  Problem Relation Age of Onset   Coronary artery disease Father        died @ 80   Other Mother        cerebral hemorrhage - died @ 38   Arthritis Mother    Cancer Brother        Bladder   Prostate cancer  Brother    Nephritis Brother        died @ age 53.   Other Brother        cerebral hemorrhage - died @ 80   Heart disease Brother    Breast cancer Other        niece x 2   Diabetes Neg Hx    Colon cancer Neg Hx    Adrenal disorder Neg Hx     Review of Systems  Constitutional: Negative for appetite change (appetite is good), chills and fever.  HENT: Positive for trouble swallowing.   Respiratory: Positive for shortness of breath (with activity). Negative for cough and wheezing.   Cardiovascular: Positive for leg swelling (intermittent - takes lasix prn). Negative for chest pain and palpitations.  Gastrointestinal: Positive for constipation. Negative for abdominal pain and nausea.       No gerd  Genitourinary: Negative for dysuria.  Musculoskeletal: Positive for arthralgias (shoulders ache).  Neurological: Positive for numbness (lower legs). Negative for light-headedness and headaches.       Objective:   Vitals:   11/28/18 1055  BP: (!) 168/90  Pulse:  76  Temp: 97.8 F (36.6 C)  SpO2: 99%   BP Readings from Last 3 Encounters:  11/28/18 (!) 168/90  11/27/18 (!) 164/79  11/13/18 (!) 174/85   Wt Readings from Last 3 Encounters:  11/28/18 156 lb (70.8 kg)  10/04/18 150 lb (68 kg)  10/04/18 153 lb (69.4 kg)   Body mass index is 21.76 kg/m.   Physical Exam    Constitutional: Appears well-developed and well-nourished. No distress.  HENT:  Head: Normocephalic and atraumatic.  Neck: Neck supple. No tracheal deviation present. No thyromegaly present.  No cervical lymphadenopathy Cardiovascular: Normal rate, regular rhythm and normal heart sounds.   Numeral 2/6 systolic murmur heard. No carotid bruit .  No edema Pulmonary/Chest: Effort normal and breath sounds normal. No respiratory distress. No has no wheezes. No rales.  Skin: Skin is warm and dry. Not diaphoretic.  Psychiatric: Normal mood and affect. Behavior is normal.      Assessment & Plan:    We  discussed some of his symptoms that he is experiencing.  He has multiple medical problems and it is difficult to know what is contributing to all of his symptoms.  I discussed with him that I do not think I can give him an answer and I am unsure which of his symptoms are related in which are separate.  Encouraged him to continue to follow-up with a specialist.  He has a appointment coming up with Dr. Amil Amen.  Advised him to schedule a follow-up with Dr. Tomi Likens.  Will see ENT.  Has cardiology appt.  We can consider pulmonary evaluation given his SOB that is not thought to be cardiac.  He deferred that for now.  We will defer blood work since he has had blood work done throughout this year without defining a cause.  Sure he will be getting blood work through his specialist over the next couple of months.  Discussed that he could look into going to a big academic center to have his symptoms further evaluated, but that may be difficult because he has some the symptoms and would need to narrow what he wanted evaluated.   See Problem List for Assessment and Plan of chronic medical problems.    This visit occurred during the SARS-CoV-2 public health emergency.  Safety protocols were in place, including screening questions prior to the visit, additional usage of staff PPE, and extensive cleaning of exam room while observing appropriate contact time as indicated for disinfecting solutions.

## 2018-11-28 ENCOUNTER — Other Ambulatory Visit: Payer: Self-pay

## 2018-11-28 ENCOUNTER — Ambulatory Visit (INDEPENDENT_AMBULATORY_CARE_PROVIDER_SITE_OTHER): Payer: Medicare Other | Admitting: Internal Medicine

## 2018-11-28 ENCOUNTER — Encounter: Payer: Self-pay | Admitting: Internal Medicine

## 2018-11-28 DIAGNOSIS — I10 Essential (primary) hypertension: Secondary | ICD-10-CM | POA: Diagnosis not present

## 2018-11-28 DIAGNOSIS — M7062 Trochanteric bursitis, left hip: Secondary | ICD-10-CM

## 2018-11-28 DIAGNOSIS — N183 Chronic kidney disease, stage 3 unspecified: Secondary | ICD-10-CM

## 2018-11-28 DIAGNOSIS — R634 Abnormal weight loss: Secondary | ICD-10-CM | POA: Diagnosis not present

## 2018-11-28 DIAGNOSIS — M7061 Trochanteric bursitis, right hip: Secondary | ICD-10-CM | POA: Diagnosis not present

## 2018-11-28 DIAGNOSIS — R5383 Other fatigue: Secondary | ICD-10-CM

## 2018-11-28 DIAGNOSIS — I25119 Atherosclerotic heart disease of native coronary artery with unspecified angina pectoris: Secondary | ICD-10-CM

## 2018-11-28 NOTE — Patient Instructions (Addendum)
Continue following up with your specialists.     Schedule an appointment with Dr Tomi Likens.

## 2018-11-28 NOTE — Assessment & Plan Note (Signed)
Appetite is good and weight has stabilized and he has actually gained some weight

## 2018-11-28 NOTE — Assessment & Plan Note (Signed)
Blood pressure elevated here today and has been slightly elevated at home He has an appointment with cardiology and will discuss this with him about revising his medication

## 2018-11-28 NOTE — Assessment & Plan Note (Signed)
Following with Dr. Justin Mend Creatinine stable

## 2018-11-28 NOTE — Assessment & Plan Note (Signed)
Following with orthopedics Hip pain has improved with bilateral injections

## 2018-11-28 NOTE — Assessment & Plan Note (Signed)
He continues to have generalized fatigue and generalized weakness Encouraged him to follow-up with a specialist to help determine the cause Continue regular exercise and advised him to push himself as much as possible when exercising to hopefully improve his strength and conditioning

## 2018-12-09 ENCOUNTER — Telehealth: Payer: Self-pay | Admitting: Neurology

## 2018-12-09 NOTE — Telephone Encounter (Signed)
Spoke with patient she states that his PCP Dr. Quay Burow requested that patient call Dr. Tomi Likens to schedule appt to discuss his symptoms he was seen by her last week. Pt c/o shooting pains in left leg and left arm elbow numbness radiating into hands.   Pt states that he had a EEG in past  Pt sent to front desk for appt.

## 2018-12-09 NOTE — Telephone Encounter (Signed)
Patient left msg about not knowing his medication- And said something about Dr. Quay Burow. Please call him back. Thanks!

## 2018-12-11 ENCOUNTER — Ambulatory Visit (INDEPENDENT_AMBULATORY_CARE_PROVIDER_SITE_OTHER): Payer: Medicare Other | Admitting: Otolaryngology

## 2018-12-11 ENCOUNTER — Encounter (INDEPENDENT_AMBULATORY_CARE_PROVIDER_SITE_OTHER): Payer: Self-pay | Admitting: Otolaryngology

## 2018-12-11 ENCOUNTER — Other Ambulatory Visit: Payer: Self-pay

## 2018-12-11 ENCOUNTER — Ambulatory Visit (HOSPITAL_COMMUNITY)
Admission: RE | Admit: 2018-12-11 | Discharge: 2018-12-11 | Disposition: A | Discharge status: Home / Self Care | Payer: Medicare Other | Source: Ambulatory Visit | Attending: Nephrology | Admitting: Nephrology

## 2018-12-11 VITALS — BP 150/71 | HR 65 | Resp 20

## 2018-12-11 VITALS — Temp 98.0°F

## 2018-12-11 DIAGNOSIS — J310 Chronic rhinitis: Secondary | ICD-10-CM

## 2018-12-11 DIAGNOSIS — D631 Anemia in chronic kidney disease: Secondary | ICD-10-CM | POA: Insufficient documentation

## 2018-12-11 DIAGNOSIS — I25119 Atherosclerotic heart disease of native coronary artery with unspecified angina pectoris: Secondary | ICD-10-CM | POA: Diagnosis not present

## 2018-12-11 DIAGNOSIS — H6121 Impacted cerumen, right ear: Secondary | ICD-10-CM | POA: Diagnosis not present

## 2018-12-11 DIAGNOSIS — N183 Chronic kidney disease, stage 3 unspecified: Secondary | ICD-10-CM | POA: Diagnosis present

## 2018-12-11 LAB — IRON AND TIBC
Iron: 21 ug/dL — ABNORMAL LOW (ref 45–182)
Saturation Ratios: 6 % — ABNORMAL LOW (ref 17.9–39.5)
TIBC: 360 ug/dL (ref 250–450)
UIBC: 339 ug/dL

## 2018-12-11 LAB — FERRITIN: Ferritin: 15 ng/mL — ABNORMAL LOW (ref 24–336)

## 2018-12-11 MED ORDER — EPOETIN ALFA-EPBX 10000 UNIT/ML IJ SOLN
20000.0000 [IU] | INTRAMUSCULAR | Status: DC
  Administered 2018-12-11: 20000 [IU] via SUBCUTANEOUS
  Filled 2018-12-11: qty 2

## 2018-12-11 MED ORDER — FLUTICASONE PROPIONATE 50 MCG/ACT NA SUSP: 2.0000 | Freq: Every day | NASAL | 6 refills | Status: DC

## 2018-12-11 NOTE — Progress Notes (Signed)
HPI: [name] is a 85 y.o. male who presents for evaluation of nasal obstruction at night.  He also complains of having a dry mouth at night and some difficulty swallowing.  He had a previous barium swallow performed 2 years ago that did not show any structural abnormality in the upper esophagus.  There is no stricture noted.  He states that sometimes when he swallows food goes the wrong way and makes him cough.  For his nasal obstruction he has tried Flonase Claritin and saline and saline rinses.  He also complains of partial blockage of his right ear with dry skin.  His wife states that he breathes frequently from his mouth at night which causes sore throat and dry mouth.  Past Medical History:  Diagnosis Date  . Acute superficial venous thrombosis of lower extremity    a. RLE after CABG, neg dopp for DVT.  [name] Allergy   . Anemia   . Atrial fibrillation ([place])    a. Post-op from CABG, on amiodarone temporarily, d/c'd 12/2011  . CAD (coronary artery disease)    a. S/P stenting to mid RCA, prox PDA 06/1999. b. NSTEMI/CABG x 3 in 10/2011 with LIMA to LAD, SVG to PDA, and SVG to OM1.   . Chronic UTI    a. Followed by Dr. Risa Grill - colonized/asymptomatic - not on abx  . CKD (chronic kidney disease), stage III    a. stable with a creatinine around 1.9-2.0 followed by nephrology.  . Dyslipidemia   . Echocardiogram    Echocardiogram 08/2018: EF 50-55, inf-lat AK, mild LVH, Gr 1 DD, RVSP 47, mild LAE, mild to mod MR, mild AI, mild AS (mean 11)  . GERD (gastroesophageal reflux disease)   . Hypertension    well-controlled.  . Myocardial infarction ([place])   . Neck injury    a. C3-C4 and C4-C5 foraminal narrowing, severe  . Prostate cancer ([place])    a. 2001 s/p TURP.  . Pulmonary nodule    a. felt to be noncancerous.  Status post followup CT scan 4 mm and stable.  . Renal artery stenosis ([place])    a. 50% by cath 2001  . 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 ANTERIOR CHAMBER WASHOUT Left 10/04/2018   Procedure: Anterior Chamber Washout, Vitreous Tap;  Surgeon: Jalene Mullet, MD;  Location: [facility];  Service: Ophthalmology;  Laterality: Left;  . cardia catherization  07-07-99  . CARDIAC SURGERY  10/18/12   open heart surgery  . CATARACT EXTRACTION W/ INTRAOCULAR LENS  IMPLANT, BILATERAL  3/205, 06/2013   [facility]  . COLONOSCOPY  04/12/07  . CORONARY ARTERY BYPASS GRAFT  10/19/2011   Procedure: CORONARY ARTERY BYPASS GRAFTING (CABG);  Surgeon: Gaye Pollack, MD;  Location: [facility];  Service: Open Heart Surgery;  Laterality: N/A;  times three using Left Internal Mammary Artery and Right Greater Saphenouse Vein Graft harvested Endoscopically  . edg  07-17-1994  . FLEXIBLE SIGMOIDOSCOPY  11-03-1997  . GAS INSERTION Left 10/04/2018   Procedure: Insertion Of Gas;  Surgeon: Jalene Mullet, MD;  Location: [facility];  Service: Ophthalmology;  Laterality: Left;  [name] GAS/FLUID EXCHANGE Left 10/04/2018   Procedure: Gas/Fluid Exchange;  Surgeon: Jalene Mullet, MD;  Location: [facility];  Service: Ophthalmology;  Laterality: Left;  . LEFT HEART CATHETERIZATION WITH CORONARY ANGIOGRAM N/A 10/16/2011   Procedure: LEFT HEART CATHETERIZATION WITH CORONARY ANGIOGRAM;  Surgeon: Collier Salina  M Martinique, MD;  Location[facility] C[facility] LAB;  Service: Cardiovascular;  Laterality: N/A;  . LEFT HEART CATHETERIZATION WITH CORONARY/GRAFT ANGIOGRAM N/A 03/11/2013   Procedure: LEFT HEART CATHETERIZATION WITH [name];  Surgeon: Blane Ohara, MD;  Location: [facility] CATH LAB;  Service: Cardiovascular;  Laterality: N/A;  . lumbar spinal disk and neck fusion surgery    . PACEMAKER INSERTION  03/28/12   PPM implanted for mobitz II AV block  . PARS PLANA VITRECTOMY Left 10/04/2018   Procedure: PARS PLANA VITRECTOMY 25 GAUGE FOR ENDOPHTHALMITIS WITH INJECTION OF INTRAVITREAL ANTIBIOTIC;  Surgeon: Jalene Mullet, MD;  Location: [place];  Service:  Ophthalmology;  Laterality: Left;  . peripheral vascular catherization  11-24-03  . PERMANENT PACEMAKER INSERTION N/A 03/28/2012   Procedure: PERMANENT PACEMAKER INSERTION;  Surgeon: Thompson Grayer, MD;  Location: [facility] CATH LAB;  Service: Cardiovascular;  Laterality: N/A;  . PROSTATECTOMY    . renal circulation  10-01-03  . s/p ptca    . stents     X 2  . stress cardiolite  05-04-05   spring 09-negative except for apical thinning, EF 68%   Social History   Socioeconomic History  . Marital status: Married    Spouse name: [name]  . Number of children: 2  . Years of education: Not on file  . Highest education level: Not on file  Occupational History  . 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 Financial resource strain: Not on file  . Food insecurity    Worry: Not on file    Inability: Not on file  . Transportation needs    Medical: Not on file    Non-medical: Not on file  Tobacco Use  . Smoking status: Never Smoker  . Smokeless tobacco: Never Used  Substance and Sexual Activity  . Alcohol use: Yes    Comment: Rarely  . Drug use: No  . Sexual activity: Not on file  Lifestyle  . Physical activity    Days per week: Not on file    Minutes per session: Not on file  . Stress: Not on file  Relationships  . Social Herbalist on phone: Not on file    Gets together: Not on file    Attends religious service: Not on file    Active member of club or organization: Not on file    Attends meetings of clubs or organizations: Not on file    Relationship status: Not on file  Other Topics Concern  . Not on file  Social History Narrative   HSG, 1 year college.  married '52 - 3 years, divorced; married '56 - 3 years divorced; married '88-12 yrs - divorced; married '75 -. 1 son '57; 1 daughter - '53; 1 grandchild.  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  Very happily married.  End of life care: yes CPR, no long term mechanical ventilation, no heroic measures.   Family History  Problem Relation Age of Onset  . Coronary artery disease Father        died @ 50  . Other Mother        cerebral hemorrhage - died @ 21  . Arthritis Mother   . Cancer Brother        Bladder  .  Prostate cancer Brother   . Nephritis Brother        died @ age 54.  . Other Brother        cerebral hemorrhage - died @ 64  . Heart disease Brother   . Breast cancer Other        niece x 2  . Diabetes Neg Hx   . Colon cancer Neg Hx   . Adrenal disorder Neg Hx    Allergies  Allergen Reactions  . Aspirin Nausea And Vomiting and Other (See Comments)    Upset stomach- tolerates coated aspirin   . Lisinopril Anaphylaxis and Shortness Of Breath    After 3 tablets, he experienced trouble swallowing, throat tightness and hoarseness.   . Amoxicillin Nausea Only  . Atarax [Hydroxyzine Hcl] Nausea And Vomiting  . Cephalexin Diarrhea  . Ciprofloxacin Nausea Only  . Clindamycin Other (See Comments)    Reaction not recalled  . Clobetasol Other (See Comments)    Not effective  . Codeine Nausea Only and Other (See Comments)    Stomach upset  . Fish Allergy Nausea And Vomiting  . Fluarix [Flu Virus Vaccine] Itching  . Haemophilus Influenzae Itching  . Hydrocodone Nausea Only  . Hydrocodone-Acetaminophen Nausea And Vomiting  . Hydroxyzine Nausea And Vomiting  . Latex Itching and Other (See Comments)    (After flu shot)  . Macrobid [Nitrofurantoin Macrocrystal] Nausea Only  . Niacin Other (See Comments)    Unknown reaction  . Niacin-Lovastatin Er Other (See Comments)    Unsure of reaction. Taking simvastatin at home without problems  . Niacin-Lovastatin Er Other (See Comments)    Reaction not recalled  . Nitrofurantoin Other (See Comments)    Upset stomach   . Omeprazole Other (See Comments)     Reaction not recalled- stopped by MD  . Vibramycin [Doxycycline] Other (See Comments)    Reaction not recalled  . Adhesive [Tape] Rash  . Bactrim [Sulfamethoxazole-Trimethoprim] Rash  . Colchicine Rash  . Gabapentin     Painful pimples on tongue   Prior to Admission medications   Medication Sig Start Date End Date Taking? Authorizing Provider  amLODipine (NORVASC) 2.5 MG tablet Take 1 tablet (2.5 mg total) by mouth daily. 06/16/18 06/11/19 Yes [name], MD  Aromatic Inhalants (VICKS VAPOR INHALER IN) Place 1 puff into both nostrils as needed (for congestion).   Yes [provider]  aspirin EC 81 MG tablet Take 81 mg by mouth daily.   Yes [provider]  Carboxymeth-Glycerin-Polysorb (REFRESH OPTIVE MEGA-3 OP) Place 1 drop into both eyes 2 (two) times daily.   Yes [provider]  Cyanocobalamin (VITAMIN B-12 IJ) Inject 1 mL as directed every 30 (thirty) days.   Yes [provider]  epoetin alfa-epbx (RETACRIT) 2000 UNIT/ML injection 2,000 Units every 14 (fourteen) days.   Yes [provider]  loratadine (CLARITIN) 10 MG tablet Take 10 mg by mouth daily as needed (for seasonal allergies).   Yes [provider]  Multiple Vitamins-Minerals (PRESERVISION AREDS 2 PO) Take 1 capsule by mouth 2 (two) times daily.    Yes [provider]  neomycin-polymyxin-dexamethasone (MAXITROL) 0.1 % ophthalmic suspension 1 drop daily as needed.   Yes [provider]  nitroGLYCERIN (NITROSTAT) 0.4 MG SL tablet DISSOLVE 1 TABLET UNDER THE TONGUE EVERY 5 MINUTES AS NEEDED FOR CHEST PAIN (MAX OF 3 TABLETS) Patient taking differently: Place 0.4 mg under the tongue every 5 (five) minutes x 3 doses as needed for chest pain.  11/28/17  Yes Burns, Claudina Lick, MD  TOPROL XL 25 MG 24 hr tablet TAKE 1 TABLET AT BEDTIME (DOSE DECREASE) Patient taking differently: Take 25 mg by mouth every evening.  04/21/18  Yes [name], MD  triamcinolone  cream (KENALOG) 0.1 % Apply 1 application topically as needed (for itching- to affected sites).    Yes [provider]  vitamin C (ASCORBIC ACID) 500 MG tablet Take 1,000 mg by mouth daily.    Yes [provider]  Vitamin D, Ergocalciferol, (DRISDOL) 1.25 MG (50000 UT) CAPS capsule Take 50,000 Units by mouth every 30 (thirty) days.    Yes [provider]  Zinc 50 MG CAPS Take 50 mg by mouth at bedtime.    Yes [provider]  fluticasone (FLONASE) 50 MCG/ACT nasal spray Place 2 sprays into both nostrils daily. 12/11/18   Rozetta Nunnery, MD  furosemide (LASIX) 40 MG tablet Take 1 tablet (40 mg total) by mouth as needed for fluid or edema. 02/05/18 11/28/18  Richardson Dopp T, PA-C     Positive ROS: Otherwise negative  All other systems have been reviewed and were otherwise negative with the exception of those mentioned in the HPI and as above.  Physical Exam: Constitutional: Alert, well-appearing, no acute distress Ears: External ears without lesions or tenderness. Ear canal on the right side was occluded with dried cerumen.  Left ear canal and left TM are clear.  Right ear canal was cleaned in the office and the TM was clear..  Nasal: External nose without lesions. Septum septal deviation to the right.. Clear nasal passages.  Mild rhinitis.  He has some crusting of the left middle turbinate and left middle meatus region that was removed in the office with forceps.  There are no polyps.  There was no obvious evidence of mucopurulent discharge. Oral: Lips and gums without lesions. Tongue and palate mucosa without lesions. Posterior oropharynx clear.  Indirect laryngoscopy revealed a clear base of tongue vallecula and epiglottis.  Vocal cords were clear. Neck: No palpable adenopathy or masses Respiratory: Breathing comfortably  Skin: No facial/neck lesions or rash noted.  Cerumen impaction removal  Date/Time: 12/11/2018 5:08 PM Performed by: Rozetta Nunnery, MD Authorized by: Rozetta Nunnery, MD   Consent:    Consent obtained:  Verbal   Consent given by:  Patient   Risks discussed:  Pain and bleeding Procedure details:    Location:  R ear   Procedure type: forceps   Post-procedure details:    Inspection:  TM intact and canal normal   Hearing quality:  Improved   Patient tolerance of procedure:  Tolerated well, no immediate complications  I have reviewed the previous barium swallow with the patient in the office today.  On review of the barium swallow there was really no significant narrowing at the UES and no abnormality of the upper cervical esophagus.  Assessment: Chronic rhinitis Right cerumen impaction  Plan: Clean the right ear canal in the office today.. Reviewed with him concerning nasal congestion at night would recommend regular use of Flonase 2 sprays each nostril at night as well as saline rinse during the daytime.  Consider using humidifier at night to help with the dryness in the mouth.  No upper airway abnormalities noted on clinical exam in the office today.  Radene Journey, MD

## 2018-12-12 LAB — POCT HEMOGLOBIN-HEMACUE: Hemoglobin: 10.5 g/dL — ABNORMAL LOW (ref 13.0–17.0)

## 2018-12-12 MED ORDER — FLUTICASONE PROPIONATE 50 MCG/ACT NA SUSP: 2.0000 | Freq: Every day | NASAL | 6 refills | Status: DC

## 2018-12-12 NOTE — Addendum Note (Signed)
Addended by: Melony Overly E on: 12/12/2018 05:51 PM   Modules accepted: Orders

## 2018-12-15 ENCOUNTER — Other Ambulatory Visit: Payer: Self-pay | Admitting: Specialist

## 2018-12-15 DIAGNOSIS — M545 Low back pain, unspecified: Secondary | ICD-10-CM
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
## 2018-12-17 ENCOUNTER — Telehealth (INDEPENDENT_AMBULATORY_CARE_PROVIDER_SITE_OTHER): Payer: Medicare Other | Admitting: Neurology

## 2018-12-17 ENCOUNTER — Encounter: Payer: Self-pay | Admitting: Neurology

## 2018-12-17 ENCOUNTER — Other Ambulatory Visit: Payer: Self-pay

## 2018-12-17 VITALS — Ht 71.0 in | Wt 152.0 lb

## 2018-12-17 DIAGNOSIS — G5622 Lesion of ulnar nerve, left upper limb: Secondary | ICD-10-CM

## 2018-12-17 DIAGNOSIS — R748 Abnormal levels of other serum enzymes: Secondary | ICD-10-CM

## 2018-12-17 DIAGNOSIS — R29898 Other symptoms and signs involving the musculoskeletal system: Secondary | ICD-10-CM

## 2018-12-17 DIAGNOSIS — M48062 Spinal stenosis, lumbar region with neurogenic claudication: Secondary | ICD-10-CM | POA: Diagnosis not present

## 2018-12-17 NOTE — Addendum Note (Signed)
Addended by: Ranae Plumber on: 12/17/2018 03:34 PM   Modules accepted: Orders

## 2018-12-19 ENCOUNTER — Ambulatory Visit: Payer: Medicare Other

## 2018-12-22 ENCOUNTER — Ambulatory Visit
Admission: RE | Admit: 2018-12-22 | Discharge: 2018-12-22 | Disposition: A | Discharge status: Home / Self Care | Payer: Medicare Other | Source: Ambulatory Visit | Attending: Specialist | Admitting: Specialist

## 2018-12-22 DIAGNOSIS — M545 Low back pain, unspecified: Secondary | ICD-10-CM

## 2018-12-23 ENCOUNTER — Other Ambulatory Visit (INDEPENDENT_AMBULATORY_CARE_PROVIDER_SITE_OTHER): Payer: Medicare Other

## 2018-12-23 ENCOUNTER — Other Ambulatory Visit: Payer: Self-pay

## 2018-12-23 DIAGNOSIS — M48062 Spinal stenosis, lumbar region with neurogenic claudication: Secondary | ICD-10-CM

## 2018-12-23 DIAGNOSIS — R29898 Other symptoms and signs involving the musculoskeletal system: Secondary | ICD-10-CM

## 2018-12-23 DIAGNOSIS — R748 Abnormal levels of other serum enzymes: Secondary | ICD-10-CM

## 2018-12-23 DIAGNOSIS — G5622 Lesion of ulnar nerve, left upper limb: Secondary | ICD-10-CM

## 2018-12-23 LAB — CK: Total CK: 127 U/L (ref 7–232)

## 2018-12-24 ENCOUNTER — Ambulatory Visit: Payer: Medicare Other

## 2018-12-25 ENCOUNTER — Other Ambulatory Visit: Payer: Self-pay

## 2018-12-25 ENCOUNTER — Ambulatory Visit (HOSPITAL_COMMUNITY)
Admission: RE | Admit: 2018-12-25 | Discharge: 2018-12-25 | Disposition: A | Discharge status: Home / Self Care | Payer: Medicare Other | Source: Ambulatory Visit | Attending: Nephrology | Admitting: Nephrology

## 2018-12-25 VITALS — BP 151/68 | HR 64 | Temp 97.9°F | Resp 18

## 2018-12-25 DIAGNOSIS — N183 Chronic kidney disease, stage 3 unspecified: Secondary | ICD-10-CM | POA: Diagnosis not present

## 2018-12-25 LAB — POCT HEMOGLOBIN-HEMACUE: Hemoglobin: 10.5 g/dL — ABNORMAL LOW (ref 13.0–17.0)

## 2018-12-25 MED ORDER — EPOETIN ALFA-EPBX 10000 UNIT/ML IJ SOLN
20000.0000 [IU] | INTRAMUSCULAR | Status: DC
  Administered 2018-12-25: 20000 [IU] via SUBCUTANEOUS

## 2018-12-25 MED ORDER — EPOETIN ALFA-EPBX 10000 UNIT/ML IJ SOLN
INTRAMUSCULAR | Status: AC
  Filled 2018-12-25: qty 2

## 2018-12-30 ENCOUNTER — Other Ambulatory Visit: Payer: Self-pay

## 2018-12-31 ENCOUNTER — Ambulatory Visit (INDEPENDENT_AMBULATORY_CARE_PROVIDER_SITE_OTHER): Payer: Medicare Other | Admitting: Neurology

## 2018-12-31 ENCOUNTER — Other Ambulatory Visit: Payer: Self-pay

## 2018-12-31 DIAGNOSIS — R29898 Other symptoms and signs involving the musculoskeletal system: Secondary | ICD-10-CM

## 2018-12-31 DIAGNOSIS — M48062 Spinal stenosis, lumbar region with neurogenic claudication: Secondary | ICD-10-CM | POA: Diagnosis not present

## 2018-12-31 DIAGNOSIS — G5622 Lesion of ulnar nerve, left upper limb: Secondary | ICD-10-CM

## 2018-12-31 DIAGNOSIS — R748 Abnormal levels of other serum enzymes: Secondary | ICD-10-CM

## 2018-12-31 NOTE — Procedures (Signed)
[facility]  [place], [place]  [place], [place] [address] Tel: [phone] Fax:  [phone] Test Date:  12/31/2018  Patient: [name] DOB: 03-16-1926 Physician: Narda Amber, DO  Sex: Male Height: ' 0" Ref Phys:   ID#: [id number]   Technician:    Patient Complaints: This is a 85 year old man with history of lumbar surgery referred for evaluation of hyperCKemia, left leg pain,and bilateral leg weakness.  NCV & EMG Findings: Extensive electrodiagnostic testing of the right lower extremity and additional studies of the left shows:  1. Bilateral sural and right superficial peroneal sensory responses are within normal limits.  Left superficial peroneal response is absent, which is a normal finding for patient's age. 2. Bilateral peroneal and tibial motor responses show reduced amplitudes, and most likely due to degenerative changes from localized compression.  Bilateral peroneal motor responses of the tibialis anterior are within normal limits. 3. Bilateral tibial H reflex studies are within normal limits. 4. There is no evidence of active or chronic motor axonal loss changes affecting any of the tested muscles.  Motor unit configuration and recruitment pattern is within normal limits.    Impression: This is a normal, age-appropriate, study of the lower extremities.  In particular, there is no evidence of a sensorimotor polyneuropathy, myopathy, or lumbosacral radiculopathy.    ___________________________ Narda Amber, DO    Nerve Conduction Studies Anti Sensory Summary Table   Site NR Peak (ms) Norm Peak (ms) P-T Amp (V) Norm P-T Amp  Left Sup Peroneal Anti Sensory (Ant Lat Mall)  31C  12 cm NR  <4.6  >3  Right Sup Peroneal Anti Sensory (Ant Lat Mall)  31C  12 cm    2.7 <4.6 5.4 >3  Site 2    2.8  5.9   Left Sural Anti Sensory (Lat Mall)  31C  Calf    3.5 <4.6 3.9 >3  Site 2    4.1  4.2   Right Sural Anti Sensory (Lat Mall)  31C  Calf     3.1 <4.6 5.7 >3  Site 2    3.1  4.0    Motor Summary Table   Site NR Onset (ms) Norm Onset (ms) O-P Amp (mV) Norm O-P Amp Site1 Site2 Delta-0 (ms) Dist (cm) Vel (m/s) Norm Vel (m/s)  Left Peroneal Motor (Ext Dig Brev)  31C  Ankle    5.8 <6.0 1.9 >2.5 B Fib Ankle 9.7 39.0 40 >40  B Fib    15.5  1.6  Poplt B Fib 2.1 9.0 43 >40  Poplt    17.6  1.4         Right Peroneal Motor (Ext Dig Brev)  31C  Ankle    5.0 <6.0 0.8 >2.5 B Fib Ankle 9.5 39.0 41 >40  B Fib    14.5  0.9  Poplt B Fib 2.1 9.0 43 >40  Poplt    16.6  0.9         Left Peroneal TA Motor (Tib Ant)  31C  Fib Head    4.3 <4.5 4.4 >3 Poplit Fib Head 2.0 9.0 45 >40  Poplit    6.3  4.1         Right Peroneal TA Motor (Tib Ant)  31C  Fib Head    4.5 <4.5 4.1 >3 Poplit Fib Head 2.1 9.0 43 >40  Poplit    6.6  4.0         Left Tibial Motor (Abd Hall Brev)  31C  Ankle    5.1 <6.0 3.1 >4 Knee Ankle 11.5 46.0 40 >40  Knee    16.6  3.0         Right Tibial Motor (Abd Hall Brev)  31C  Ankle    3.2 <6.0 3.8 >4 Knee Ankle 11.6 46.0 40 >40  Knee    14.8  3.8          H Reflex Studies   NR H-Lat (ms) Lat Norm (ms) L-R H-Lat (ms)  Left Tibial (Gastroc)  31C     34.97 <35 6.53  Right Tibial (Gastroc)  31C     28.44 <35 6.53   EMG   Side Muscle Ins Act Fibs Psw Fasc Number Recrt Dur Dur. Amp Amp. Poly Poly. Comment  Right AntTibialis Nml Nml Nml Nml Nml Nml Nml Nml Nml Nml Nml Nml N/A  Right Gastroc Nml Nml Nml Nml Nml Nml Nml Nml Nml Nml Nml Nml N/A  Right Flex Dig Long Nml Nml Nml Nml Nml Nml Nml Nml Nml Nml Nml Nml N/A  Right RectFemoris Nml Nml Nml Nml Nml Nml Nml Nml Nml Nml Nml Nml N/A  Right GluteusMed Nml Nml Nml Nml Nml Nml Nml Nml Nml Nml Nml Nml N/A  Right Iliopsoas Nml Nml Nml Nml Nml Nml Nml Nml Nml Nml Nml Nml N/A  Left Iliopsoas Nml Nml Nml Nml Nml Nml Nml Nml Nml Nml Nml Nml N/A  Left AntTibialis Nml Nml Nml Nml Nml Nml Nml Nml Nml Nml Nml Nml N/A  Left Gastroc Nml Nml Nml Nml Nml Nml Nml Nml Nml Nml Nml Nml  N/A  Left Flex Dig Long Nml Nml Nml Nml Nml Nml Nml Nml Nml Nml Nml Nml N/A  Left RectFemoris Nml Nml Nml Nml Nml Nml Nml Nml Nml Nml Nml Nml N/A  Left GluteusMed Nml Nml Nml Nml Nml Nml Nml Nml Nml Nml Nml Nml N/A      Waveforms:

## 2019-01-05 ENCOUNTER — Other Ambulatory Visit: Payer: Self-pay

## 2019-01-05 ENCOUNTER — Encounter: Payer: Self-pay | Admitting: Cardiovascular Disease

## 2019-01-05 ENCOUNTER — Ambulatory Visit (INDEPENDENT_AMBULATORY_CARE_PROVIDER_SITE_OTHER): Payer: Medicare Other

## 2019-01-05 ENCOUNTER — Ambulatory Visit (INDEPENDENT_AMBULATORY_CARE_PROVIDER_SITE_OTHER): Payer: Medicare Other | Admitting: Cardiovascular Disease

## 2019-01-05 VITALS — BP 144/62 | HR 66 | Ht 71.0 in | Wt 149.0 lb

## 2019-01-05 DIAGNOSIS — I5032 Chronic diastolic (congestive) heart failure: Secondary | ICD-10-CM | POA: Diagnosis not present

## 2019-01-05 DIAGNOSIS — I10 Essential (primary) hypertension: Secondary | ICD-10-CM | POA: Diagnosis not present

## 2019-01-05 DIAGNOSIS — E538 Deficiency of other specified B group vitamins: Secondary | ICD-10-CM | POA: Diagnosis not present

## 2019-01-05 DIAGNOSIS — E782 Mixed hyperlipidemia: Secondary | ICD-10-CM

## 2019-01-05 DIAGNOSIS — I25119 Atherosclerotic heart disease of native coronary artery with unspecified angina pectoris: Secondary | ICD-10-CM | POA: Diagnosis not present

## 2019-01-05 MED ORDER — CYANOCOBALAMIN 1000 MCG/ML IJ SOLN
1000.0000 ug | Freq: Once | INTRAMUSCULAR | Status: AC
  Administered 2019-01-05: 1000 ug via INTRAMUSCULAR

## 2019-01-05 NOTE — Progress Notes (Signed)
Cardiology Office Note:    Date:  01/05/2019   ID:  [name], DOB November 05, 1926, MRN [id number]  PCP:  [name], MD  Cardiologist:  [name], MD  Electrophysiologist:  None   Referring MD: [name], MD   Chief Complaint  Patient presents with  . Shortness of Breath    History of Present Illness:    [name] is a 85 y.o. male with a hx of coronary artery disease, presenting for follow-up evaluation.  The patient has a history of RCA stenting.  He ultimately was treated with multivessel CABG in 2013 with early graft failure of all of his venous conduits and continued patency of the mammary artery to LAD graft.  He had a nuclear stress test in 2019 demonstrating no significant ischemia.  Comorbid medical conditions include chronic diastolic heart failure, symptomatic bradycardia status post permanent pacemaker, chronic kidney disease stage 3, mixed hyperlipidemia, hypertension, prostate cancer, and chronic anemia.  He was last seen in our office in July 2020 by Richardson Dopp, at which time he was felt to be clinically stable.  A heart murmur was appreciated and an echocardiogram was ordered.  This was completed in August 2020 and demonstrated low normal LV systolic function with an LVEF of 50 to 55%, normal RV function, mild pulmonary hypertension, mild to moderate mitral regurgitation, and mild aortic stenosis.  He is noted to have akinesis of the basal inferolateral wall consistent with his known history of coronary disease.  Echo findings were stable compared to previous studies.  Past Medical History:  Diagnosis Date  . Acute superficial venous thrombosis of lower extremity    a. RLE after CABG, neg dopp for DVT.  [name] Allergy   . Anemia   . Atrial fibrillation ([place])    a. Post-op from CABG, on amiodarone temporarily, d/c'd 12/2011  . CAD (coronary artery disease)    a. S/P stenting to mid RCA, prox PDA 06/1999. b. NSTEMI/CABG x 3 in 10/2011 with LIMA to  LAD, SVG to PDA, and SVG to OM1.   . Chronic UTI    a. Followed by Dr. Risa Grill - colonized/asymptomatic - not on abx  . CKD (chronic kidney disease), stage III    a. stable with a creatinine around 1.9-2.0 followed by nephrology.  . Dyslipidemia   . Echocardiogram    Echocardiogram 08/2018: EF 50-55, inf-lat AK, mild LVH, Gr 1 DD, RVSP 47, mild LAE, mild to mod MR, mild AI, mild AS (mean 11)  . GERD (gastroesophageal reflux disease)   . Hypertension    well-controlled.  . Myocardial infarction ([place])   . Neck injury    a. C3-C4 and C4-C5 foraminal narrowing, severe  . Prostate cancer ([place])    a. 2001 s/p TURP.  . Pulmonary nodule    a. felt to be noncancerous.  Status post followup CT scan 4 mm and stable.  . Renal artery stenosis ([place])    a. 50% by cath 2001  . 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 ANTERIOR CHAMBER WASHOUT Left 10/04/2018   Procedure: Anterior Chamber Washout, Vitreous Tap;  Surgeon: Jalene Mullet, MD;  Location: [facility];  Service: Ophthalmology;  Laterality: Left;  . cardia catherization  07-07-99  . CARDIAC SURGERY  10/18/12   open heart surgery  . CATARACT EXTRACTION W/ INTRAOCULAR LENS  IMPLANT, BILATERAL  3/205, 06/2013   [facility]  .  COLONOSCOPY  04/12/07  . CORONARY ARTERY BYPASS GRAFT  10/19/2011   Procedure: CORONARY ARTERY BYPASS GRAFTING (CABG);  Surgeon: Gaye Pollack, MD;  Location: [facility];  Service: Open Heart Surgery;  Laterality: N/A;  times three using Left Internal Mammary Artery and Right Greater [place]hen[place] Vein Graft harvested Endoscopically  . edg  07-17-1994  . FLEXIBLE SIGMOIDOSCOPY  11-03-1997  . GAS INSERTION Left 10/04/2018   Procedure: Insertion Of Gas;  Surgeon: Jalene Mullet, MD;  Location: [facility];  Service: Ophthalmology;  Laterality: Left;  [name] GAS/FLUID EXCHANGE Left 10/04/2018   Procedure: Gas/Fluid Exchange;  Surgeon: Jalene Mullet, MD;   Location: [facility];  Service: Ophthalmology;  Laterality: Left;  . LEFT HEART CATHETERIZATION WITH CORONARY ANGIOGRAM N/A 10/16/2011   Procedure: LEFT HEART CATHETERIZATION WITH CORONARY ANGIOGRAM;  Surgeon: Peter M Martinique, MD;  Location: [facility] CATH LAB;  Service: Cardiovascular;  Laterality: N/A;  . LEFT HEART CATHETERIZATION WITH CORONARY/GRAFT ANGIOGRAM N/A 03/11/2013   Procedure: LEFT HEART CATHETERIZATION WITH [name];  Surgeon: Blane Ohara, MD;  Location[facility];  Service: Cardiovascular;  Laterality: N/A;  . lumbar spinal disk and neck fusion surgery    . PACEMAKER INSERTION  03/28/12   PPM implanted for mobitz II AV block  . PARS PLANA VITRECTOMY Left 10/04/2018   Procedure: PARS PLANA VITRECTOMY 25 GAUGE FOR ENDOPHTHALMITIS WITH INJECTION OF INTRAVITREAL ANTIBIOTIC;  Surgeon: Jalene Mullet, MD;  Location: [facility];  Service: Ophthalmology;  Laterality: Left;  . peripheral vascular catherization  11-24-03  . PERMANENT PACEMAKER INSERTION N/A 03/28/2012   Procedure: PERMANENT PACEMAKER INSERTION;  Surgeon: Thompson Grayer, MD;  Location: [facility] CATH LAB;  Service: Cardiovascular;  Laterality: N/A;  . PROSTATECTOMY    . renal circulation  10-01-03  . s/p ptca    . stents     X 2  . stress cardiolite  05-04-05   spring 09-negative except for apical thinning, EF 68%    Current Medications: Current Meds  Medication Sig  . amLODipine (NORVASC) 2.5 MG tablet Take 1 tablet (2.5 mg total) by mouth daily.  . Aromatic Inhalants (VICKS VAPOR INHALER IN) Place 1 puff into both nostrils as needed (for congestion).  [name] aspirin EC 81 MG tablet Take 81 mg by mouth daily.  . Carboxymeth-Glycerin-Polysorb (REFRESH OPTIVE MEGA-3 OP) Place 1 drop into both eyes 2 (two) times daily.  . Cyanocobalamin (VITAMIN B-12 IJ) Inject 1 mL as directed every 30 (thirty) days.  [name] epoetin alfa-epbx (RETACRIT) 2000 UNIT/ML injection 2,000 Units every 14 (fourteen) days.  . fluticasone (FLONASE) 50 MCG/ACT  nasal spray Place 2 sprays into both nostrils daily.  . furosemide (LASIX) 40 MG tablet Take 1 tablet (40 mg total) by mouth as needed for fluid or edema.  [name] loratadine (CLARITIN) 10 MG tablet Take 10 mg by mouth daily as needed (for seasonal allergies).  . Multiple Vitamins-Minerals (PRESERVISION AREDS 2 PO) Take 1 capsule by mouth 2 (two) times daily.   [name] neomycin-polymyxin-dexamethasone (MAXITROL) 0.1 % ophthalmic suspension 1 drop daily as needed.  . nitroGLYCERIN (NITROSTAT) 0.4 MG SL tablet DISSOLVE 1 TABLET UNDER THE TONGUE EVERY 5 MINUTES AS NEEDED FOR CHEST PAIN (MAX OF 3 TABLETS) (Patient taking differently: Place 0.4 mg under the tongue every 5 (five) minutes x 3 doses as needed for chest pain. )  . TOPROL XL 25 MG 24 hr tablet TAKE 1 TABLET AT BEDTIME (DOSE DECREASE) (Patient taking differently: Take 25 mg by mouth every evening. )  . triamcinolone cream (KENALOG)  0.1 % Apply 1 application topically as needed (for itching- to affected sites).   . vitamin C (ASCORBIC ACID) 500 MG tablet Take 1,000 mg by mouth daily.   . Vitamin D, Ergocalciferol, (DRISDOL) 1.25 MG (50000 UT) CAPS capsule Take 50,000 Units by mouth every 30 (thirty) days.   . Zinc 50 MG CAPS Take 50 mg by mouth at bedtime.      Allergies:   Aspirin, Lisinopril, Amoxicillin, Atarax [hydroxyzine hcl], Cephalexin, Ciprofloxacin, Clindamycin, Clobetasol, Codeine, Fish allergy, Fluarix [flu virus vaccine], Haemophilus influenzae, Hydrocodone, Hydrocodone-acetaminophen, Hydroxyzine, Latex, Macrobid [nitrofurantoin macrocrystal], Niacin, Niacin-lovastatin er, Niacin-lovastatin er, Nitrofurantoin, Omeprazole, Vibramycin [doxycycline], Adhesive [tape], Bactrim [sulfamethoxazole-trimethoprim], Colchicine, and Gabapentin   Social History   Socioeconomic History  . Marital status: Married    Spouse name: [name]  . Number of children: 2  . Years of education: Not on file  . Highest education level: Some college, no degree    Occupational History  . 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 Smoking status: Never Smoker  . Smokeless tobacco: Never Used  Substance and Sexual Activity  . Alcohol use: Yes    Comment: Rarely  . Drug use: No  . Sexual activity: Not on file  Other Topics Concern  . Not on file  Social History Narrative   HSG, 1 year college.  married '52 - 3 years, divorced; married '56 - 3 years divorced; married '78-12 yrs - divorced; married '75 -. 1 son '57; 1 daughter - '53; 1 grandchild.  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  Very happily married.  End of life care: yes CPR, no long term mechanical ventilation, no heroic measures.right handed   Social Determinants of Health   Financial Resource Strain:   . Difficulty of Paying Living Expenses: Not on file  Food Insecurity:   . Worried About Charity fundraiser in the Last Year: Not on file  . Ran Out of Food in the Last Year: Not on file  Transportation Needs:   . Lack of Transportation (Medical): Not on file  . Lack of Transportation (Non-Medical): Not on file  Physical Activity:   . Days of Exercise per Week: Not on file  . Minutes of Exercise per Session: Not on file  Stress:   . Feeling of Stress : Not on file  Social Connections:   . Frequency of Communication with Friends and Family: Not on file  . Frequency of Social Gatherings with Friends and Family: Not on file  . Attends Religious Services: Not on file  . Active Member of Clubs or Organizations: Not on file  . Attends Archivist Meetings: Not on file  . Marital Status: Not on file     Family History: The patient's family history includes Arthritis in his mother; Breast cancer in an other family member; Cancer in his brother; Coronary artery disease in his  father; Heart disease in his brother; Nephritis in his brother; Other in his brother and mother; Prostate cancer in his brother. There is no history of Diabetes, Colon cancer, or Adrenal disorder.  ROS:   Please see the history of present illness.    Positive for generalized fatigue.  All other systems reviewed and are negative.  EKGs/Labs/Other Studies Reviewed:    The following studies  were reviewed today: Echocardiogram 08/11/2018: IMPRESSIONS    1. The left ventricle has low normal systolic function, with an ejection fraction of 50-55%. The cavity size was normal. There is mildly increased left ventricular wall thickness. Left ventricular diastolic Doppler parameters are consistent with  impaired relaxation.  2. The right ventricle has normal systolic function. The cavity was normal. Right ventricular systolic pressure is mildly elevated with an estimated pressure of 47.0 mmHg.  3. Left atrial size was mildly dilated.  4. The mitral valve is abnormal. There is mild mitral annular calcification present. Mitral valve regurgitation is mild to moderate by color flow Doppler.  5. The aortic valve is tricuspid. Moderate calcification of the aortic valve. Aortic valve regurgitation is mild by color flow Doppler. Mild stenosis of the aortic valve.  6. The aorta is normal in size and structure.  7. Akinesis of the basal inferolateral wall with overall preserved LV systolic function; mild diastolic dysfunction; mild LVH; calcified aortic valve with mild AS (mean gradient 11 mmHg) and mild AI; mild to moderate MR; mild LAE.  Myocardial Perfusion Study - 10-24-2017 Study Highlights    The left ventricular ejection fraction is normal (55-65%).  Nuclear stress EF: 56%.  Blood pressure demonstrated a hypertensive response to exercise.  There was no ST segment deviation noted during stress.  T wave inversion of 2 mm was noted during stress in the V1 and V2 leads, ending at 4 minutes of  stress, and returning to baseline after 1-5 mins of recovery.  Defect 1: There is a large defect of moderate severity.  This is a low risk study.   No significant reversible ischemia. There is a large size, moderate intensity mostly fixed inferior defect, that mildly improves during upright positioning, suggestive of likely scar with a degree of bowel attenuation. There is also a moderate sized and intensity inferolateral perfusion defect which does change with supine or upright positioning, suggestive of scar. LVEF 56% with mild basal inferior hypokinesis. This is an intermediate risk study given the extent of fixed perfusion defects.    Recent Labs: 02/02/2018: Platelets 401; TSH 1.827 05/12/2018: ALT 14 10/04/2018: BUN 20; Creatinine, Ser 1.52; Potassium 4.3; Sodium 136 12/25/2018: Hemoglobin 10.5  Recent Lipid Panel    Component Value Date/Time   CHOL 122 09/04/2016 1123   TRIG 59.0 09/04/2016 1123   TRIG 107 01/16/2006 1338   HDL 40.40 09/04/2016 1123   CHOLHDL 3 09/04/2016 1123   VLDL 11.8 09/04/2016 1123   LDLCALC 70 09/04/2016 1123    Physical Exam:    VS:  BP (!) 144/62   Pulse 66   Ht 5\' 11"  (1.803 m)   Wt 149 lb (67.6 kg)   SpO2 98%   BMI 20.78 kg/m     Wt Readings from Last 3 Encounters:  01/05/19 149 lb (67.6 kg)  12/17/18 152 lb (68.9 kg)  11/28/18 156 lb (70.8 kg)     GEN: elderly male  in no acute distress HEENT: Normal NECK: No JVD; No carotid bruits LYMPHATICS: No lymphadenopathy CARDIAC: RRR, 2/6 harsh systolic murmur at the RUSB/apex RESPIRATORY:  Clear to auscultation without rales, wheezing or rhonchi  ABDOMEN: Soft, non-tender, non-distended MUSCULOSKELETAL:  No edema; No deformity  SKIN: Warm and dry NEUROLOGIC:  Alert and oriented x 3 PSYCHIATRIC:  Normal affect   ASSESSMENT:    1. Chronic diastolic CHF (congestive heart failure) ([place])   2. Coronary artery disease involving native coronary artery of native heart with angina pectoris ([place])  3. Essential hypertension   4. Mixed hyperlipidemia    PLAN:    In order of problems listed above:  1. The patient has fairly marked limitation with class III shortness of breath and weakness.  However, I think he is well compensated at present and his symptoms are likely multifactorial.  He has developed progressive weight loss and protein calorie malnutrition and at his advanced age I think muscle atrophy and deconditioning are playing a significant role.  I did not recommend any changes to his medical program which includes low-dose amlodipine, metoprolol succinate, as needed furosemide, and aspirin.  I will see him back in 6 months.  I reviewed his most recent echo and nuclear scans. 2. Stable without symptoms of angina.  Nuclear stress test from last year demonstrated infarct without ischemia. 3. Blood pressure is controlled on amlodipine and metoprolol succinate. 4. Multiple lipid-lowering intolerances.  In his advanced age and low body weight, I have not recommended any changes.   Medication Adjustments/Labs and Tests Ordered: Current medicines are reviewed at length with the patient today.  Concerns regarding medicines are outlined above.  No orders of the defined types were placed in this encounter.  No orders of the defined types were placed in this encounter.   Patient Instructions  Medication Instructions:  Your provider recommends that you continue on your current medications as directed. Please refer to the Current Medication list given to you today.   *If you need a refill on your cardiac medications before your next appointment, please call your pharmacy*   Follow-Up: At [facility], you and your health needs are our priority.  As part of our continuing mission to provide you with exceptional heart care, we have created designated Provider Care Teams.  These Care Teams include your primary Cardiologist (physician) and Advanced Practice Providers (APPs -  Physician  Assistants and Nurse Practitioners) who all work together to provide you with the care you need, when you need it. Your next appointment:   6 month(s) The format for your next appointment:   In Person Provider:   You may see [name], MD or one of the following Advanced Practice Providers on your designated Care Team:    Richardson Dopp, PA-C  Vin Niland, PA-C  Daune Perch, [place]    Signed, [name], MD  01/05/2019 2:16 PM    [place]

## 2019-01-05 NOTE — Progress Notes (Signed)
I have reviewed and agree.

## 2019-01-05 NOTE — Patient Instructions (Signed)
Medication Instructions:  Your provider recommends that you continue on your current medications as directed. Please refer to the Current Medication list given to you today.   *If you need a refill on your cardiac medications before your next appointment, please call your pharmacy*   Follow-Up: At [facility], you and your health needs are our priority.  As part of our continuing mission to provide you with exceptional heart care, we have created designated Provider Care Teams.  These Care Teams include your primary Cardiologist (physician) and Advanced Practice Providers (APPs -  Physician Assistants and Nurse Practitioners) who all work together to provide you with the care you need, when you need it. Your next appointment:   6 month(s) The format for your next appointment:   In Person Provider:   You may see Michael Cooper, MD or one of the following Advanced Practice Providers on your designated Care Team:    Scott Weaver, PA-C  Vin Bhagat, PA-C  Janine Hammond, NP   

## 2019-01-08 ENCOUNTER — Encounter: Payer: Medicare Other | Admitting: Neurology

## 2019-01-08 ENCOUNTER — Other Ambulatory Visit: Payer: Self-pay

## 2019-01-08 ENCOUNTER — Ambulatory Visit (HOSPITAL_COMMUNITY)
Admission: RE | Admit: 2019-01-08 | Discharge: 2019-01-08 | Disposition: A | Discharge status: Home / Self Care | Payer: Medicare Other | Source: Ambulatory Visit | Attending: Nephrology | Admitting: Nephrology

## 2019-01-08 VITALS — BP 160/86 | HR 84 | Temp 96.8°F | Resp 20

## 2019-01-08 DIAGNOSIS — N183 Chronic kidney disease, stage 3 unspecified: Secondary | ICD-10-CM | POA: Insufficient documentation

## 2019-01-08 LAB — POCT HEMOGLOBIN-HEMACUE: Hemoglobin: 11.7 g/dL — ABNORMAL LOW (ref 13.0–17.0)

## 2019-01-08 MED ORDER — EPOETIN ALFA-EPBX 10000 UNIT/ML IJ SOLN
INTRAMUSCULAR | Status: AC
  Administered 2019-01-08: 20000 [IU] via SUBCUTANEOUS
  Filled 2019-01-08: qty 2

## 2019-01-08 MED ORDER — EPOETIN ALFA-EPBX 10000 UNIT/ML IJ SOLN: 20000.0000 [IU] | INTRAMUSCULAR | Status: DC

## 2019-01-13 ENCOUNTER — Ambulatory Visit (INDEPENDENT_AMBULATORY_CARE_PROVIDER_SITE_OTHER): Payer: Medicare Other | Admitting: *Deleted

## 2019-01-13 DIAGNOSIS — I442 Atrioventricular block, complete: Secondary | ICD-10-CM

## 2019-01-13 LAB — CUP PACEART REMOTE DEVICE CHECK
Battery Impedance: 503 Ohm
Battery Remaining Longevity: 93 mo
Battery Voltage: 2.79 V
Brady Statistic AP VP Percent: 0 %
Brady Statistic AP VS Percent: 40 %
Brady Statistic AS VP Percent: 0 %
Brady Statistic AS VS Percent: 59 %
Date Time Interrogation Session: 20210105084115
Implantable Lead Implant Date: 20140321
Implantable Lead Implant Date: 20140321
Implantable Lead Location: 753859
Implantable Lead Location: 753860
Implantable Lead Model: 5076
Implantable Lead Model: 5092
Implantable Pulse Generator Implant Date: 20140321
Lead Channel Impedance Value: 422 Ohm
Lead Channel Impedance Value: 735 Ohm
Lead Channel Pacing Threshold Amplitude: 0.625 V
Lead Channel Pacing Threshold Amplitude: 0.875 V
Lead Channel Pacing Threshold Pulse Width: 0.4 ms
Lead Channel Pacing Threshold Pulse Width: 0.4 ms
Lead Channel Setting Pacing Amplitude: 2 V
Lead Channel Setting Pacing Amplitude: 2.5 V
Lead Channel Setting Pacing Pulse Width: 0.4 ms
Lead Channel Setting Sensing Sensitivity: 5.6 mV

## 2019-01-22 ENCOUNTER — Encounter (HOSPITAL_COMMUNITY)
Admission: RE | Admit: 2019-01-22 | Discharge: 2019-01-22 | Disposition: A | Discharge status: Home / Self Care | Payer: Medicare Other | Source: Ambulatory Visit | Attending: Nephrology | Admitting: Nephrology

## 2019-01-22 ENCOUNTER — Other Ambulatory Visit: Payer: Self-pay

## 2019-01-22 VITALS — BP 160/70 | HR 64 | Temp 97.0°F | Resp 16

## 2019-01-22 DIAGNOSIS — N183 Chronic kidney disease, stage 3 unspecified: Secondary | ICD-10-CM | POA: Insufficient documentation

## 2019-01-22 DIAGNOSIS — D631 Anemia in chronic kidney disease: Secondary | ICD-10-CM | POA: Insufficient documentation

## 2019-01-22 LAB — IRON AND TIBC
Iron: 68 ug/dL (ref 45–182)
Saturation Ratios: 18 % (ref 17.9–39.5)
TIBC: 371 ug/dL (ref 250–450)
UIBC: 303 ug/dL

## 2019-01-22 LAB — FERRITIN: Ferritin: 17 ng/mL — ABNORMAL LOW (ref 24–336)

## 2019-01-22 MED ORDER — EPOETIN ALFA-EPBX 10000 UNIT/ML IJ SOLN
INTRAMUSCULAR | Status: AC
  Administered 2019-01-22: 20000 [IU]
  Filled 2019-01-22: qty 2

## 2019-01-22 MED ORDER — EPOETIN ALFA-EPBX 10000 UNIT/ML IJ SOLN: 20000.0000 [IU] | INTRAMUSCULAR | Status: DC

## 2019-01-23 LAB — POCT HEMOGLOBIN-HEMACUE: Hemoglobin: 11 g/dL — ABNORMAL LOW (ref 13.0–17.0)

## 2019-01-30 ENCOUNTER — Other Ambulatory Visit: Payer: Self-pay

## 2019-01-30 ENCOUNTER — Ambulatory Visit: Payer: Medicare Other | Attending: Internal Medicine

## 2019-01-30 DIAGNOSIS — Z23 Encounter for immunization: Secondary | ICD-10-CM

## 2019-01-30 NOTE — Progress Notes (Signed)
   Covid-19 Vaccination Clinic  Name:  [name]    MRN: [id number] DOB: 06/28/1926  01/30/2019  [name] was observed post Covid-19 immunization for 15 minutes without incidence. He was provided with Vaccine Information Sheet and instruction to access the V-Safe system.   [name] was instructed to call 911 with any severe reactions post vaccine: [name] Difficulty breathing  . Swelling of your face and throat  . A fast heartbeat  . A bad rash all over your body  . Dizziness and weakness    Immunizations Administered    Name Date Dose VIS Date Route   Pfizer COVID-19 Vaccine 01/30/2019 10:09 AM 0.3 mL 12/19/2018 Intramuscular   Manufacturer: [place]   Lot: [id number]   [place]: [id number]

## 2019-02-02 ENCOUNTER — Other Ambulatory Visit: Payer: Self-pay | Admitting: Internal Medicine

## 2019-02-05 ENCOUNTER — Encounter (HOSPITAL_COMMUNITY)
Admission: RE | Admit: 2019-02-05 | Discharge: 2019-02-05 | Disposition: A | Discharge status: Home / Self Care | Payer: Medicare Other | Source: Ambulatory Visit | Attending: Nephrology | Admitting: Nephrology

## 2019-02-05 ENCOUNTER — Ambulatory Visit (INDEPENDENT_AMBULATORY_CARE_PROVIDER_SITE_OTHER): Payer: Medicare Other | Admitting: *Deleted

## 2019-02-05 ENCOUNTER — Other Ambulatory Visit: Payer: Self-pay

## 2019-02-05 VITALS — BP 168/73 | HR 74 | Temp 97.0°F | Resp 20

## 2019-02-05 DIAGNOSIS — N183 Chronic kidney disease, stage 3 unspecified: Secondary | ICD-10-CM | POA: Diagnosis not present

## 2019-02-05 DIAGNOSIS — E538 Deficiency of other specified B group vitamins: Secondary | ICD-10-CM | POA: Diagnosis not present

## 2019-02-05 LAB — POCT HEMOGLOBIN-HEMACUE: Hemoglobin: 12.9 g/dL — ABNORMAL LOW (ref 13.0–17.0)

## 2019-02-05 MED ORDER — EPOETIN ALFA-EPBX 10000 UNIT/ML IJ SOLN: 20000.0000 [IU] | INTRAMUSCULAR | Status: DC

## 2019-02-05 MED ORDER — EPOETIN ALFA-EPBX 10000 UNIT/ML IJ SOLN
INTRAMUSCULAR | Status: AC
  Filled 2019-02-05: qty 2

## 2019-02-05 MED ORDER — CYANOCOBALAMIN 1000 MCG/ML IJ SOLN
1000.0000 ug | Freq: Once | INTRAMUSCULAR | Status: AC
  Administered 2019-02-05: 14:00:00 1000 ug via INTRAMUSCULAR

## 2019-02-05 NOTE — Progress Notes (Signed)
Pls cosign for B12 since Dr. Quay Burow is out of the office

## 2019-02-10 ENCOUNTER — Telehealth: Payer: Self-pay | Admitting: Neurology

## 2019-02-10 NOTE — Telephone Encounter (Signed)
At this time only VV per Dr. Tomi Likens, not new symptoms per him, thanks

## 2019-02-10 NOTE — Telephone Encounter (Signed)
Can we switch this to interoffice?

## 2019-02-10 NOTE — Telephone Encounter (Signed)
These are not new symptoms to me.  It is office policy that follow ups are virtual at this time.

## 2019-02-10 NOTE — Telephone Encounter (Signed)
Pt is aware.  

## 2019-02-10 NOTE — Telephone Encounter (Signed)
Patient states he has virtual visit on 02-27-19 with Jaffe. Patient feels liek he needs to be seen in the office. He states that he is having  problems with leg cramps, cant pick up change or paper clips etc loosing feeling in the fingers. He feels like he is not getting better. Please call

## 2019-02-18 ENCOUNTER — Other Ambulatory Visit: Payer: Self-pay

## 2019-02-18 ENCOUNTER — Ambulatory Visit (HOSPITAL_COMMUNITY)
Admission: RE | Admit: 2019-02-18 | Discharge: 2019-02-18 | Disposition: A | Discharge status: Home / Self Care | Payer: Medicare Other | Source: Ambulatory Visit | Attending: Nephrology | Admitting: Nephrology

## 2019-02-18 VITALS — BP 165/69 | HR 67 | Temp 97.0°F | Resp 20

## 2019-02-18 DIAGNOSIS — D631 Anemia in chronic kidney disease: Secondary | ICD-10-CM | POA: Diagnosis not present

## 2019-02-18 DIAGNOSIS — N183 Chronic kidney disease, stage 3 unspecified: Secondary | ICD-10-CM | POA: Diagnosis not present

## 2019-02-18 LAB — IRON AND TIBC
Iron: 47 ug/dL (ref 45–182)
Saturation Ratios: 14 % — ABNORMAL LOW (ref 17.9–39.5)
TIBC: 339 ug/dL (ref 250–450)
UIBC: 292 ug/dL

## 2019-02-18 LAB — POCT HEMOGLOBIN-HEMACUE: Hemoglobin: 10 g/dL — ABNORMAL LOW (ref 13.0–17.0)

## 2019-02-18 LAB — FERRITIN: Ferritin: 21 ng/mL — ABNORMAL LOW (ref 24–336)

## 2019-02-18 MED ORDER — EPOETIN ALFA-EPBX 10000 UNIT/ML IJ SOLN
INTRAMUSCULAR | Status: AC
  Filled 2019-02-18: qty 2

## 2019-02-18 MED ORDER — EPOETIN ALFA-EPBX 10000 UNIT/ML IJ SOLN
20000.0000 [IU] | INTRAMUSCULAR | Status: DC
  Administered 2019-02-18: 20000 [IU] via SUBCUTANEOUS

## 2019-02-19 ENCOUNTER — Encounter (HOSPITAL_COMMUNITY): Payer: Medicare Other

## 2019-02-19 ENCOUNTER — Ambulatory Visit: Payer: Medicare Other | Attending: Internal Medicine

## 2019-02-19 DIAGNOSIS — Z23 Encounter for immunization: Secondary | ICD-10-CM | POA: Insufficient documentation

## 2019-02-19 NOTE — Progress Notes (Signed)
   Covid-19 Vaccination Clinic  Name:  [name]    MRN: [id number] DOB: April 16, 1926  02/19/2019  [name] was observed post Covid-19 immunization for 15 minutes without incidence. He was provided with Vaccine Information Sheet and instruction to access the V-Safe system.   [name] was instructed to call 911 with any severe reactions post vaccine: [name] Difficulty breathing  . Swelling of your face and throat  . A fast heartbeat  . A bad rash all over your body  . Dizziness and weakness    Immunizations Administered    Name Date Dose VIS Date Route   Pfizer COVID-19 Vaccine 02/19/2019  3:13 PM 0.3 mL 12/19/2018 Intramuscular   Manufacturer: [employer], [employer]   Lot: [id number]   [place]: [id number]
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
## 2019-02-27 ENCOUNTER — Other Ambulatory Visit: Payer: Self-pay

## 2019-02-27 ENCOUNTER — Telehealth (INDEPENDENT_AMBULATORY_CARE_PROVIDER_SITE_OTHER): Payer: Medicare Other | Admitting: Neurology

## 2019-02-27 DIAGNOSIS — M159 Polyosteoarthritis, unspecified: Secondary | ICD-10-CM | POA: Diagnosis not present

## 2019-02-27 DIAGNOSIS — I7300 Raynaud's syndrome without gangrene: Secondary | ICD-10-CM | POA: Diagnosis not present

## 2019-02-27 DIAGNOSIS — R29898 Other symptoms and signs involving the musculoskeletal system: Secondary | ICD-10-CM | POA: Diagnosis not present

## 2019-03-04 ENCOUNTER — Other Ambulatory Visit: Payer: Self-pay

## 2019-03-04 ENCOUNTER — Ambulatory Visit (HOSPITAL_COMMUNITY)
Admission: RE | Admit: 2019-03-04 | Discharge: 2019-03-04 | Disposition: A | Discharge status: Home / Self Care | Payer: Medicare Other | Source: Ambulatory Visit | Attending: Nephrology | Admitting: Nephrology

## 2019-03-04 VITALS — BP 115/46 | HR 73 | Temp 97.2°F | Resp 20

## 2019-03-04 DIAGNOSIS — N183 Chronic kidney disease, stage 3 unspecified: Secondary | ICD-10-CM | POA: Insufficient documentation

## 2019-03-04 LAB — POCT HEMOGLOBIN-HEMACUE: Hemoglobin: 10.9 g/dL — ABNORMAL LOW (ref 13.0–17.0)

## 2019-03-04 MED ORDER — EPOETIN ALFA-EPBX 10000 UNIT/ML IJ SOLN
20000.0000 [IU] | INTRAMUSCULAR | Status: DC
  Administered 2019-03-04: 20000 [IU] via SUBCUTANEOUS

## 2019-03-04 MED ORDER — EPOETIN ALFA-EPBX 10000 UNIT/ML IJ SOLN
INTRAMUSCULAR | Status: AC
  Filled 2019-03-04: qty 2

## 2019-03-05 ENCOUNTER — Encounter: Payer: Self-pay | Admitting: Neurology

## 2019-03-05 ENCOUNTER — Other Ambulatory Visit: Payer: Self-pay

## 2019-03-05 ENCOUNTER — Ambulatory Visit (INDEPENDENT_AMBULATORY_CARE_PROVIDER_SITE_OTHER): Payer: Medicare Other | Admitting: Neurology

## 2019-03-05 VITALS — BP 156/77 | HR 84 | Ht 71.0 in | Wt 156.4 lb

## 2019-03-05 DIAGNOSIS — G253 Myoclonus: Secondary | ICD-10-CM | POA: Diagnosis not present

## 2019-03-05 DIAGNOSIS — R29898 Other symptoms and signs involving the musculoskeletal system: Secondary | ICD-10-CM

## 2019-03-05 NOTE — Patient Instructions (Signed)
1.  We will check a CT of cervical spine without contrast 2.  May need to order another nerve test of the arms. 3.  Further recommendations pending results.
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
## 2019-03-06 ENCOUNTER — Ambulatory Visit (INDEPENDENT_AMBULATORY_CARE_PROVIDER_SITE_OTHER): Payer: Medicare Other | Admitting: *Deleted

## 2019-03-06 DIAGNOSIS — E538 Deficiency of other specified B group vitamins: Secondary | ICD-10-CM | POA: Diagnosis not present

## 2019-03-06 MED ORDER — CYANOCOBALAMIN 1000 MCG/ML IJ SOLN
1000.0000 ug | Freq: Once | INTRAMUSCULAR | Status: AC
  Administered 2019-03-06: 1000 ug via INTRAMUSCULAR

## 2019-03-06 NOTE — Progress Notes (Signed)
Pls cosign for B12../lmb 

## 2019-03-07 NOTE — Progress Notes (Signed)
b12 Injection given.   Hawkins Seaman J Jacquette Canales, MD  

## 2019-03-17 ENCOUNTER — Encounter (HOSPITAL_COMMUNITY)
Admission: RE | Admit: 2019-03-17 | Discharge: 2019-03-17 | Disposition: A | Discharge status: Home / Self Care | Payer: Medicare Other | Source: Ambulatory Visit | Attending: Nephrology | Admitting: Nephrology

## 2019-03-17 ENCOUNTER — Other Ambulatory Visit: Payer: Self-pay

## 2019-03-17 VITALS — BP 167/76 | HR 83

## 2019-03-17 DIAGNOSIS — D631 Anemia in chronic kidney disease: Secondary | ICD-10-CM | POA: Diagnosis not present

## 2019-03-17 DIAGNOSIS — N183 Chronic kidney disease, stage 3 unspecified: Secondary | ICD-10-CM | POA: Insufficient documentation

## 2019-03-17 LAB — IRON AND TIBC
Iron: 25 ug/dL — ABNORMAL LOW (ref 45–182)
Saturation Ratios: 7 % — ABNORMAL LOW (ref 17.9–39.5)
TIBC: 371 ug/dL (ref 250–450)
UIBC: 346 ug/dL

## 2019-03-17 LAB — POCT HEMOGLOBIN-HEMACUE: Hemoglobin: 10.8 g/dL — ABNORMAL LOW (ref 13.0–17.0)

## 2019-03-17 LAB — FERRITIN: Ferritin: 16 ng/mL — ABNORMAL LOW (ref 24–336)

## 2019-03-17 MED ORDER — EPOETIN ALFA-EPBX 10000 UNIT/ML IJ SOLN: 20000.0000 [IU] | INTRAMUSCULAR | Status: DC

## 2019-03-17 MED ORDER — EPOETIN ALFA-EPBX 10000 UNIT/ML IJ SOLN
INTRAMUSCULAR | Status: AC
  Administered 2019-03-17: 20000 [IU]
  Filled 2019-03-17: qty 2

## 2019-03-18 ENCOUNTER — Encounter: Payer: Self-pay | Admitting: Internal Medicine

## 2019-03-18 ENCOUNTER — Encounter (HOSPITAL_COMMUNITY): Payer: Medicare Other

## 2019-03-18 ENCOUNTER — Telehealth (INDEPENDENT_AMBULATORY_CARE_PROVIDER_SITE_OTHER): Payer: Medicare Other | Admitting: Internal Medicine

## 2019-03-18 VITALS — BP 145/74 | HR 115 | Ht 71.0 in | Wt 155.0 lb

## 2019-03-18 DIAGNOSIS — I441 Atrioventricular block, second degree: Secondary | ICD-10-CM

## 2019-03-18 DIAGNOSIS — Z950 Presence of cardiac pacemaker: Secondary | ICD-10-CM | POA: Diagnosis not present

## 2019-03-18 DIAGNOSIS — Z7982 Long term (current) use of aspirin: Secondary | ICD-10-CM

## 2019-03-18 DIAGNOSIS — I10 Essential (primary) hypertension: Secondary | ICD-10-CM | POA: Diagnosis not present

## 2019-03-18 DIAGNOSIS — I25119 Atherosclerotic heart disease of native coronary artery with unspecified angina pectoris: Secondary | ICD-10-CM

## 2019-03-18 NOTE — Progress Notes (Signed)
Electrophysiology TeleHealth Note   Due to national recommendations of social distancing due to COVID 19, an audio/video telehealth visit is felt to be most appropriate for this patient at this time.  See MyChart message from today for the patient's consent to telehealth for [facility].  Date:  03/18/2019   ID:  [name], DOB 16-Mar-1926, MRN [id number]  Location: patient's home  Provider location:  [facility]  Evaluation Performed: Follow-up visit  PCP:  [name], MD   Electrophysiologist:  Dr Rayann Heman  Chief Complaint:  palpitations  History of Present Illness:    [name] is a 86 y.o. male who presents via telehealth conferencing today.  Since last being seen in our clinic, the patient reports doing very well. He gets tired with quick exertion.  Today, he denies symptoms of palpitations, chest pain, shortness of breath,  lower extremity edema, dizziness, presyncope, or syncope.  The patient is otherwise without complaint today.  The patient denies symptoms of fevers, chills, cough, or new SOB worrisome for COVID 19.  Past Medical History:  Diagnosis Date  . Acute superficial venous thrombosis of lower extremity    a. RLE after CABG, neg dopp for DVT.  [name] Allergy   . Anemia   . Atrial fibrillation ([place])    a. Post-op from CABG, on amiodarone temporarily, d/c'd 12/2011  . CAD (coronary artery disease)    a. S/P stenting to mid RCA, prox PDA 06/1999. b. NSTEMI/CABG x 3 in 10/2011 with LIMA to LAD, SVG to PDA, and SVG to OM1.   . Chronic UTI    a. Followed by Dr. Risa Grill - colonized/asymptomatic - not on abx  . CKD (chronic kidney disease), stage III    a. stable with a creatinine around 1.9-2.0 followed by nephrology.  . Dyslipidemia   . Echocardiogram    Echocardiogram 08/2018: EF 50-55, inf-lat AK, mild LVH, Gr 1 DD, RVSP 47, mild LAE, mild to mod MR, mild AI, mild AS (mean 11)  . GERD (gastroesophageal reflux disease)   . Hypertension    well-controlled.  . Myocardial infarction ([place])   . Neck injury    a. C3-C4 and C4-C5 foraminal narrowing, severe  . Prostate cancer ([place])    a. 2001 s/p TURP.  . Pulmonary nodule    a. felt to be noncancerous.  Status post followup CT scan 4 mm and stable.  . Renal artery stenosis ([place])    a. 50% by cath 2001  . 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 ANTERIOR CHAMBER WASHOUT Left 10/04/2018   Procedure: Anterior Chamber Washout, Vitreous Tap;  Surgeon: Jalene Mullet, MD;  Location: [place];  Service: Ophthalmology;  Laterality: Left;  . cardia catherization  07-07-99  . CARDIAC SURGERY  10/18/12   open heart surgery  . CATARACT EXTRACTION W/ INTRAOCULAR LENS  IMPLANT, BILATERAL  3/205, 06/2013   [facility]  . COLONOSCOPY  04/12/07  . CORONARY ARTERY BYPASS GRAFT  10/19/2011   Procedure: CORONARY ARTERY BYPASS GRAFTING (CABG);  Surgeon: Gaye Pollack, MD;  Location: [place];  Service: Open Heart Surgery;  Laterality: N/A;  times three using Left Internal Mammary Artery and Right Greater Saphenouse Vein Graft harvested Endoscopically  . edg  07-17-1994  . FLEXIBLE SIGMOIDOSCOPY  11-03-1997  . GAS INSERTION Left 10/04/2018   Procedure: Insertion Of Gas;  Surgeon: Jalene Mullet, MD;  Location: [place] [facility];  Service: Ophthalmology;  Laterality: Left;  [name] GAS/FLUID EXCHANGE Left 10/04/2018   Procedure: Gas/Fluid Exchange;  Surgeon: Jalene Mullet, MD;  Location: [place];  Service: Ophthalmology;  Laterality: Left;  . LEFT HEART CATHETERIZATION WITH CORONARY ANGIOGRAM N/A 10/16/2011   Procedure: LEFT HEART CATHETERIZATION WITH CORONARY ANGIOGRAM;  Surgeon: Peter M Martinique, MD;  Location: [facility] CATH LAB;  Service: Cardiovascular;  Laterality: N/A;  . LEFT HEART CATHETERIZATION WITH CORONARY/GRAFT ANGIOGRAM N/A 03/11/2013   Procedure: LEFT HEART CATHETERIZATION WITH [name];  Surgeon: Blane Ohara, MD;   Location: [facility] CATH LAB;  Service: Cardiovascular;  Laterality: N/A;  . lumbar spinal disk and neck fusion surgery    . PACEMAKER INSERTION  03/28/12   PPM implanted for mobitz II AV block  . PARS PLANA VITRECTOMY Left 10/04/2018   Procedure: PARS PLANA VITRECTOMY 25 GAUGE FOR ENDOPHTHALMITIS WITH INJECTION OF INTRAVITREAL ANTIBIOTIC;  Surgeon: Jalene Mullet, MD;  Location: [place];  Service: Ophthalmology;  Laterality: Left;  . peripheral vascular catherization  11-24-03  . PERMANENT PACEMAKER INSERTION N/A 03/28/2012   Procedure: PERMANENT PACEMAKER INSERTION;  Surgeon: Thompson Grayer, MD;  [facility];  Service: Cardiovascular;  Laterality: N/A;  . PROSTATECTOMY    . renal circulation  10-01-03  . s/p ptca    . stents     X 2  . stress cardiolite  05-04-05   spring 09-negative except for apical thinning, EF 68%    Current Outpatient Medications  Medication Sig Dispense Refill  . amLODipine (NORVASC) 2.5 MG tablet Take 1 tablet (2.5 mg total) by mouth daily. 90 tablet 3  . Aromatic Inhalants (VICKS VAPOR INHALER IN) Place 1 puff into both nostrils as needed (for congestion).    [name] aspirin EC 81 MG tablet Take 81 mg by mouth daily.    . Carboxymeth-Glycerin-Polysorb (REFRESH OPTIVE MEGA-3 OP) Place 1 drop into both eyes 2 (two) times daily.    . Cyanocobalamin (VITAMIN B-12 IJ) Inject 1 mL as directed every 30 (thirty) days.    [name] epoetin alfa-epbx (RETACRIT) 2000 UNIT/ML injection 2,000 Units every 14 (fourteen) days.    . fluticasone (FLONASE) 50 MCG/ACT nasal spray Place 2 sprays into both nostrils daily. 16 g 6  . folic acid (FOLVITE) 1 MG tablet Take 1 tablet (1 mg total) by mouth daily. Annual appt due in May must see provider for future refills 90 tablet 0  . loratadine (CLARITIN) 10 MG tablet Take 10 mg by mouth daily as needed (for seasonal allergies).    . Multiple Vitamins-Minerals (PRESERVISION AREDS 2 PO) Take 1 capsule by mouth 2 (two) times daily.     . nitroGLYCERIN  (NITROSTAT) 0.4 MG SL tablet DISSOLVE 1 TABLET UNDER THE TONGUE EVERY 5 MINUTES AS NEEDED FOR CHEST PAIN (MAX OF 3 TABLETS) (Patient taking differently: Place 0.4 mg under the tongue every 5 (five) minutes x 3 doses as needed for chest pain. ) 100 tablet 4  . TOPROL XL 25 MG 24 hr tablet TAKE 1 TABLET AT BEDTIME (DOSE DECREASE) (Patient taking differently: Take 25 mg by mouth every evening. ) 90 tablet 3  . triamcinolone cream (KENALOG) 0.1 % Apply 1 application topically as needed (for itching- to affected sites).     . vitamin C (ASCORBIC ACID) 500 MG tablet Take 1,000 mg by mouth daily.     . Vitamin D, Ergocalciferol, (DRISDOL) 1.25 MG (50000 UT) CAPS capsule Take 50,000 Units by mouth every 30 (thirty) days.     [name]  Zinc 50 MG CAPS Take 50 mg by mouth at bedtime.     . furosemide (LASIX) 40 MG tablet Take 1 tablet (40 mg total) by mouth as needed for fluid or edema. 90 tablet 3   No current facility-administered medications for this visit.    Allergies:   Aspirin, Lisinopril, Amoxicillin, Atarax [hydroxyzine hcl], Cephalexin, Ciprofloxacin, Clindamycin, Clobetasol, Codeine, Fish allergy, Fluarix [flu virus vaccine], Haemophilus influenzae, Hydrocodone, Hydrocodone-acetaminophen, Hydroxyzine, Latex, Macrobid [nitrofurantoin macrocrystal], Niacin, Niacin-lovastatin er, Niacin-lovastatin er, Nitrofurantoin, Omeprazole, Vibramycin [doxycycline], Adhesive [tape], Bactrim [sulfamethoxazole-trimethoprim], Colchicine, and Gabapentin   Social History:  The patient  reports that he has never smoked. He has never used smokeless tobacco. He reports current alcohol use. He reports that he does not use drugs.   ROS:  Please see the history of present illness.   All other systems are personally reviewed and negative.    Exam:    Vital Signs:  BP (!) 145/74   Pulse (!) 115   Ht 5\' 11"  (1.803 m)   Wt 155 lb (70.3 kg)   BMI 21.62 kg/m   Well sounding and appearing, alert and conversant, regular work of  breathing,  good skin color Eyes- anicteric, neuro- grossly intact, skin- no apparent rash or lesions or cyanosis, mouth- oral mucosa is pink  Labs/Other Tests and Data Reviewed:    Recent Labs: 05/12/2018: ALT 14 10/04/2018: BUN 20; Creatinine, Ser 1.52; Potassium 4.3; Sodium 136 03/17/2019: Hemoglobin 10.8   Wt Readings from Last 3 Encounters:  03/18/19 155 lb (70.3 kg)  03/05/19 156 lb 6.4 oz (70.9 kg)  01/05/19 149 lb (67.6 kg)     Last device remote is reviewed from [place] PDF which reveals normal device function, no arrhythmias   ASSESSMENT & PLAN:    1.  Mobitz II second degree AV block Remotes are up to date Normal pacemaker function Histograms look good.  I do not feel that pacemaker adjustment would improve his fatigue.  2. CAD No ischemic symptoms He wishes to stop toprol to see if his fatigue improves.  He will keep an eye on his BP once doing so  3. HTN Stable He wishes to stop toprol due to fatigue He will keep an eye on his pacemaker  Follow-up:  12 months with me   Patient Risk:  after full review of this patients clinical status, I feel that they are at moderate risk at this time.  Today, I have spent 15 minutes with the patient with telehealth technology discussing arrhythmia management .    Army Fossa, MD  03/18/2019 10:58 AM     [place] [address] [phone] (office) [phone] (fax)

## 2019-03-19 ENCOUNTER — Ambulatory Visit
Admission: RE | Admit: 2019-03-19 | Discharge: 2019-03-19 | Disposition: A | Discharge status: Home / Self Care | Payer: Medicare Other | Source: Ambulatory Visit | Attending: Neurology | Admitting: Neurology

## 2019-03-19 DIAGNOSIS — G253 Myoclonus: Secondary | ICD-10-CM

## 2019-03-19 DIAGNOSIS — R29898 Other symptoms and signs involving the musculoskeletal system: Secondary | ICD-10-CM

## 2019-03-23 ENCOUNTER — Telehealth: Payer: Self-pay

## 2019-03-23 ENCOUNTER — Other Ambulatory Visit: Payer: Self-pay

## 2019-03-23 DIAGNOSIS — R200 Anesthesia of skin: Secondary | ICD-10-CM

## 2019-03-23 DIAGNOSIS — R29898 Other symptoms and signs involving the musculoskeletal system: Secondary | ICD-10-CM

## 2019-03-23 NOTE — Telephone Encounter (Signed)
Pt called given results orders placed for BUE EMG pt complaint of both arms weakness with numbness and tingling per Dr. Tomi Likens order EMG for BUE

## 2019-03-24 ENCOUNTER — Ambulatory Visit (INDEPENDENT_AMBULATORY_CARE_PROVIDER_SITE_OTHER): Payer: Medicare Other | Admitting: Internal Medicine

## 2019-03-24 ENCOUNTER — Other Ambulatory Visit: Payer: Self-pay

## 2019-03-24 ENCOUNTER — Encounter: Payer: Self-pay | Admitting: Internal Medicine

## 2019-03-24 VITALS — BP 160/86 | HR 84 | Temp 98.1°F | Resp 16 | Ht 71.0 in | Wt 158.0 lb

## 2019-03-24 DIAGNOSIS — M109 Gout, unspecified: Secondary | ICD-10-CM | POA: Diagnosis not present

## 2019-03-24 DIAGNOSIS — I25119 Atherosclerotic heart disease of native coronary artery with unspecified angina pectoris: Secondary | ICD-10-CM | POA: Diagnosis not present

## 2019-03-24 MED ORDER — PREDNISONE 10 MG PO TABS: ORAL_TABLET | ORAL | 0 refills | Status: DC

## 2019-03-24 NOTE — Patient Instructions (Signed)
Take the prednisone as prescribed.     Please call if there is no improvement in your symptoms.      Gout  Gout is a condition that causes painful swelling of the joints. Gout is a type of inflammation of the joints (arthritis). This condition is caused by having too much uric acid in the body. Uric acid is a chemical that forms when the body breaks down substances called purines. Purines are important for building body proteins. When the body has too much uric acid, sharp crystals can form and build up inside the joints. This causes pain and swelling. Gout attacks can happen quickly and may be very painful (acute gout). Over time, the attacks can affect more joints and become more frequent (chronic gout). Gout can also cause uric acid to build up under the skin and inside the kidneys. What are the causes? This condition is caused by too much uric acid in your blood. This can happen because:  Your kidneys do not remove enough uric acid from your blood. This is the most common cause.  Your body makes too much uric acid. This can happen with some cancers and cancer treatments. It can also occur if your body is breaking down too many red blood cells (hemolytic anemia).  You eat too many foods that are high in purines. These foods include organ meats and some seafood. Alcohol, especially beer, is also high in purines. A gout attack may be triggered by trauma or stress. What increases the risk? You are more likely to develop this condition if you:  Have a family history of gout.  Are male and middle-aged.  Are male and have gone through menopause.  Are obese.  Frequently drink alcohol, especially beer.  Are dehydrated.  Lose weight too quickly.  Have an organ transplant.  Have lead poisoning.  Take certain medicines, including aspirin, cyclosporine, diuretics, levodopa, and niacin.  Have kidney disease.  Have a skin condition called psoriasis. What are the signs or  symptoms? An attack of acute gout happens quickly. It usually occurs in just one joint. The most common place is the big toe. Attacks often start at night. Other joints that may be affected include joints of the feet, ankle, knee, fingers, wrist, or elbow. Symptoms of this condition may include:  Severe pain.  Warmth.  Swelling.  Stiffness.  Tenderness. The affected joint may be very painful to touch.  Shiny, red, or purple skin.  Chills and fever. Chronic gout may cause symptoms more frequently. More joints may be involved. You may also have white or yellow lumps (tophi) on your hands or feet or in other areas near your joints. How is this diagnosed? This condition is diagnosed based on your symptoms, medical history, and physical exam. You may have tests, such as:  Blood tests to measure uric acid levels.  Removal of joint fluid with a thin needle (aspiration) to look for uric acid crystals.  X-rays to look for joint damage. How is this treated? Treatment for this condition has two phases: treating an acute attack and preventing future attacks. Acute gout treatment may include medicines to reduce pain and swelling, including:  NSAIDs.  Steroids. These are strong anti-inflammatory medicines that can be taken by mouth (orally) or injected into a joint.  Colchicine. This medicine relieves pain and swelling when it is taken soon after an attack. It can be given by mouth or through an IV. Preventive treatment may include:  Daily use of smaller doses  of NSAIDs or colchicine.  Use of a medicine that reduces uric acid levels in your blood.  Changes to your diet. You may need to see a dietitian about what to eat and drink to prevent gout. Follow these instructions at home: During a gout attack   If directed, put ice on the affected area: ? Put ice in a plastic bag. ? Place a towel between your skin and the bag. ? Leave the ice on for 20 minutes, 2-3 times a day.  Raise  (elevate) the affected joint above the level of your heart as often as possible.  Rest the joint as much as possible. If the affected joint is in your leg, you may be given crutches to use.  Follow instructions from your health care provider about eating or drinking restrictions. Avoiding future gout attacks  Follow a low-purine diet as told by your dietitian or health care provider. Avoid foods and drinks that are high in purines, including liver, kidney, anchovies, asparagus, herring, mushrooms, mussels, and beer.  Maintain a healthy weight or lose weight if you are overweight. If you want to lose weight, talk with your health care provider. It is important that you do not lose weight too quickly.  Start or maintain an exercise program as told by your health care provider. Eating and drinking  Drink enough fluids to keep your urine pale yellow.  If you drink alcohol: ? Limit how much you use to:  0-1 drink a day for women.  0-2 drinks a day for men. ? Be aware of how much alcohol is in your drink. In the [place] one drink equals one 12 oz bottle of beer (355 mL) one 5 oz glass of wine (148 mL), or one 1 oz glass of hard liquor (44 mL). General instructions  Take over-the-counter and prescription medicines only as told by your health care provider.  Do not drive or use heavy machinery while taking prescription pain medicine.  Return to your normal activities as told by your health care provider. Ask your health care provider what activities are safe for you.  Keep all follow-up visits as told by your health care provider. This is important. Contact a health care provider if you have:  Another gout attack.  Continuing symptoms of a gout attack after 10 days of treatment.  Side effects from your medicines.  Chills or a fever.  Burning pain when you urinate.  Pain in your lower back or belly. Get help right away if you:  Have severe or uncontrolled pain.  Cannot  urinate. Summary  Gout is painful swelling of the joints caused by inflammation.  The most common site of pain is the big toe, but it can affect other joints in the body.  Medicines and dietary changes can help to prevent and treat gout attacks. This information is not intended to replace advice given to you by your health care provider. Make sure you discuss any questions you have with your health care provider. Document Revised: 07/17/2017 Document Reviewed: 07/17/2017 Elsevier Patient Education  Portal.

## 2019-03-24 NOTE — Progress Notes (Signed)
Subjective:    Patient ID: [name], male    DOB: [birth year], 86 y.o.   MRN: [id number]  HPI The patient is here for an acute visit.  Last Friday his ankles were getting swollen.  He started lasix 40 mg x 4 days.  He has had to do intermittent Lasix in the past for swelling and 4 days typically works well.  After two days there was no improvement in the left leg and it was getting bigger and started to become painful.  He states the right leg seems to be getting better.  He continued Lasix for 2 more days.  This morning the pain in the left foot woke him up - it was really hurting.  He iced it and propped it up.  He could hardly walk on it.  His toes are shiny.  He was unsure if the pain was related to neuropathy or possible gout, which he has had in the past.  He states he does not understand why the Lasix did not work.   Medications and allergies reviewed with patient and updated if appropriate.  Patient Active Problem List   Diagnosis Date Noted  . Weight loss 06/18/2018  . Myositis 06/18/2018  . 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 Elevated CK 05/12/2018  . Myoclonic jerking 02/10/2018  . Chronic diastolic CHF (congestive heart failure) ([place]) 02/05/2018  . UGIB (upper gastrointestinal bleed) 02/01/2018  . Lumbar post-laminectomy syndrome 12/12/2017  . Pain of left hip joint 11/29/2017  . Trochanteric bursitis of left hip 11/29/2017  . Hyperpigmentation 11/04/2017  . Spinal stenosis of lumbar region 10/09/2017  . Abnormal appearance of skin 10/07/2017  . Frequent urination 09/03/2017  . Gouty arthritis of right great toe 09/03/2017  . Postnasal drip 08/06/2017  . Rash and nonspecific skin eruption 08/06/2017  . Moderate aortic regurgitation 07/04/2017  . Diastolic dysfunction 02/58/5277  . Edema 06/29/2017  . DOE (dyspnea on exertion) 06/29/2017  . Sinus symptom 06/05/2017  . Fatigue 05/18/2017  . Sleeping difficulty 05/18/2017  . Foot pain,  bilateral 04/22/2017  . Trochanteric bursitis 01/10/2017  . Internal hemorrhoids 08/15/2016  . Dysphagia 02/06/2016  . Cephalalgia 07/14/2015  . Constipation 03/01/2015  . Myalgia 03/02/2014  . CHB (complete heart block) ([place]) 05/03/2012  . Postoperative atrial fibrillation ([place]) 05/03/2012  . Pacemaker 04/10/2012  . 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 Coronary atherosclerosis 11/27/2011  . Presence of aortocoronary bypass graft 10/24/2011  . Pulmonary nodule   . CAD (coronary artery disease)   . CKD (chronic kidney disease) stage 3, GFR 30-59 ml/min (HCC)   . Venous insufficiency of leg 06/05/2010  . SHOULDER PAIN, RIGHT, CHRONIC 10/14/2009  . B12 deficiency 08/23/2009  . Peripheral neuropathy 03/21/2009  . DEGENERATIVE DISC DISEASE, CERVICAL SPINE, W/RADICULOPATHY 09/21/2008  . Dyslipidemia 01/17/2007  . Essential hypertension 07/20/2006  . Prostate cancer ([place]) 07/20/2006    Current Outpatient Medications on File Prior to Visit  Medication Sig Dispense Refill  . amLODipine (NORVASC) 2.5 MG tablet Take 1 tablet (2.5 mg total) by mouth daily. 90 tablet 3  . Aromatic Inhalants (VICKS VAPOR INHALER IN) Place 1 puff into both nostrils as needed (for congestion).    [name] aspirin EC 81 MG tablet Take 81 mg by mouth daily.    . Carboxymeth-Glycerin-Polysorb (REFRESH OPTIVE MEGA-3 OP) Place 1 drop into both eyes 2 (two) times daily.    . Cyanocobalamin (VITAMIN B-12 IJ) Inject 1 mL  as directed every 30 (thirty) days.    [name] epoetin alfa-epbx (RETACRIT) 2000 UNIT/ML injection 2,000 Units every 14 (fourteen) days.    . fluticasone (FLONASE) 50 MCG/ACT nasal spray Place 2 sprays into both nostrils daily. 16 g 6  . folic acid (FOLVITE) 1 MG tablet Take 1 tablet (1 mg total) by mouth daily. Annual appt due in May must see provider for future refills 90 tablet 0  . loratadine (CLARITIN) 10 MG tablet Take 10 mg by mouth daily as needed (for seasonal allergies).    .  Multiple Vitamins-Minerals (PRESERVISION AREDS 2 PO) Take 1 capsule by mouth 2 (two) times daily.     . nitroGLYCERIN (NITROSTAT) 0.4 MG SL tablet DISSOLVE 1 TABLET UNDER THE TONGUE EVERY 5 MINUTES AS NEEDED FOR CHEST PAIN (MAX OF 3 TABLETS) (Patient taking differently: Place 0.4 mg under the tongue every 5 (five) minutes x 3 doses as needed for chest pain. ) 100 tablet 4  . triamcinolone cream (KENALOG) 0.1 % Apply 1 application topically as needed (for itching- to affected sites).     . vitamin C (ASCORBIC ACID) 500 MG tablet Take 1,000 mg by mouth daily.     . Vitamin D, Ergocalciferol, (DRISDOL) 1.25 MG (50000 UT) CAPS capsule Take 50,000 Units by mouth every 30 (thirty) days.     . Zinc 50 MG CAPS Take 50 mg by mouth at bedtime.     . furosemide (LASIX) 40 MG tablet Take 1 tablet (40 mg total) by mouth as needed for fluid or edema. 90 tablet 3   No current facility-administered medications on file prior to visit.    Past Medical History:  Diagnosis Date  . Acute superficial venous thrombosis of lower extremity    a. RLE after CABG, neg dopp for DVT.  [name] Allergy   . Anemia   . Atrial fibrillation ([place])    a. Post-op from CABG, on amiodarone temporarily, d/c'd 12/2011  . CAD (coronary artery disease)    a. S/P stenting to mid RCA, prox PDA 06/1999. b. NSTEMI/CABG x 3 in 10/2011 with LIMA to LAD, SVG to PDA, and SVG to OM1.   . Chronic UTI    a. Followed by Dr. Risa Grill - colonized/asymptomatic - not on abx  . CKD (chronic kidney disease), stage III    a. stable with a creatinine around 1.9-2.0 followed by nephrology.  . Dyslipidemia   . Echocardiogram    Echocardiogram 08/2018: EF 50-55, inf-lat AK, mild LVH, Gr 1 DD, RVSP 47, mild LAE, mild to mod MR, mild AI, mild AS (mean 11)  . GERD (gastroesophageal reflux disease)   . Hypertension    well-controlled.  . Myocardial infarction ([place])   . Neck injury    a. C3-C4 and C4-C5 foraminal narrowing, severe  . Prostate cancer ([place])    a.  2001 s/p TURP.  . Pulmonary nodule    a. felt to be noncancerous.  Status post followup CT scan 4 mm and stable.  . Renal artery stenosis ([place])    a. 50% by cath 2001  . 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 ANTERIOR CHAMBER WASHOUT Left 10/04/2018   Procedure: Anterior Chamber Washout, Vitreous Tap;  Surgeon: Jalene Mullet, MD;  Location: [facility];  Service: Ophthalmology;  Laterality: Left;  . cardia catherization  07-07-99  . CARDIAC SURGERY  10/18/12   open heart surgery  .  CATARACT EXTRACTION W/ INTRAOCULAR LENS  IMPLANT, BILATERAL  3/205, 06/2013   [facility]  . COLONOSCOPY  04/12/07  . CORONARY ARTERY BYPASS GRAFT  10/19/2011   Procedure: CORONARY ARTERY BYPASS GRAFTING (CABG);  Surgeon: Gaye Pollack, MD;  Location: [facility];  Service: Open Heart Surgery;  Laterality: N/A;  times three using Left Internal Mammary Artery and Right Greater Saphenouse Vein Graft harvested Endoscopically  . edg  07-17-1994  . FLEXIBLE SIGMOIDOSCOPY  11-03-1997  . GAS INSERTION Left 10/04/2018   Procedure: Insertion Of Gas;  Surgeon: Jalene Mullet, MD;  Location: [facility];  Service: Ophthalmology;  Laterality: Left;  [name] GAS/FLUID EXCHANGE Left 10/04/2018   Procedure: Gas/Fluid Exchange;  Surgeon: Jalene Mullet, MD;  Location: [place];  Service: Ophthalmology;  Laterality: Left;  . LEFT HEART CATHETERIZATION WITH CORONARY ANGIOGRAM N/A 10/16/2011   Procedure: LEFT HEART CATHETERIZATION WITH CORONARY ANGIOGRAM;  Surgeon: Peter M Martinique, MD;  [facility] C[facility] LAB;  Service: Cardiovascular;  Laterality: N/A;  . LEFT HEART CATHETERIZATION WITH CORONARY/GRAFT ANGIOGRAM N/A 03/11/2013   Procedure: LEFT HEART CATHETERIZATION WITH [name];  Surgeon: Blane Ohara, MD;  Location: [facility] CATH LAB;  Service: Cardiovascular;  Laterality: N/A;  . lumbar spinal disk and neck fusion surgery    . PACEMAKER INSERTION  03/28/12    PPM implanted for mobitz II AV block  . PARS PLANA VITRECTOMY Left 10/04/2018   Procedure: PARS PLANA VITRECTOMY 25 GAUGE FOR ENDOPHTHALMITIS WITH INJECTION OF INTRAVITREAL ANTIBIOTIC;  Surgeon: Jalene Mullet, MD;  Location: [place];  Service: Ophthalmology;  Laterality: Left;  . peripheral vascular catherization  11-24-03  . PERMANENT PACEMAKER INSERTION N/A 03/28/2012   Procedure: PERMANENT PACEMAKER INSERTION;  Surgeon: Thompson Grayer, MD;  Location: [facility] CATH LAB;  Service: Cardiovascular;  Laterality: N/A;  . PROSTATECTOMY    . renal circulation  10-01-03  . s/p ptca    . stents     X 2  . stress cardiolite  05-04-05   spring 09-negative except for apical thinning, EF 68%    Social History   Socioeconomic History  . Marital status: Married    Spouse name: [name]  . Number of children: 2  . Years of education: Not on file  . Highest education level: Some college, no degree  Occupational History  . 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 Smoking status: Never Smoker  . Smokeless tobacco: Never Used  Substance and Sexual Activity  . Alcohol use: Yes    Comment: Rarely  . Drug use: No  . Sexual activity: Not on file  Other Topics Concern  . Not on file  Social History Narrative   HSG, 1 year college.  married '52 - 3 years, divorced; married '56 - 3 years divorced; married '83-12 yrs - divorced; married '75 -. 1 son '57; 1 daughter - '53; 1 grandchild.  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  Very happily married.  End of life care: yes CPR, no long term mechanical ventilation, no heroic measures.right handed   Social Determinants of Health   Financial Resource Strain:   . Difficulty of Paying Living Expenses:   Food Insecurity:   . Worried About Charity fundraiser in the  Last Year:   . [place] in the  Last Year:   Transportation Needs:   . Film/video editor (Medical):   [name] Lack of Transportation (Non-Medical):   Physical Activity:   . Days of Exercise per Week:   . Minutes of Exercise per Session:   Stress:   . Feeling of Stress :   Social Connections:   . Frequency of Communication with Friends and Family:   . Frequency of Social Gatherings with Friends and Family:   . Attends Religious Services:   . Active Member of Clubs or Organizations:   . Attends Archivist Meetings:   [name] Marital Status:     Family History  Problem Relation Age of Onset  . Coronary artery disease Father        died @ 61  . Other Mother        cerebral hemorrhage - died @ 71  . Arthritis Mother   . Cancer Brother        Bladder  . Prostate cancer Brother   . Nephritis Brother        died @ age 83.  . Other Brother        cerebral hemorrhage - died @ 69  . Heart disease Brother   . Breast cancer Other        niece x 2  . Diabetes Neg Hx   . Colon cancer Neg Hx   . Adrenal disorder Neg Hx     Review of Systems  Constitutional: Negative for chills and fever.  Cardiovascular: Positive for leg swelling.  Skin: Positive for color change. Negative for wound.       Objective:   Vitals:   03/24/19 1402  BP: (!) 160/86  Pulse: 84  Resp: 16  Temp: 98.1 F (36.7 C)  SpO2: 99%   BP Readings from Last 3 Encounters:  03/24/19 (!) 160/86  03/18/19 (!) 145/74  03/17/19 (!) 167/76   Wt Readings from Last 3 Encounters:  03/24/19 158 lb (71.7 kg)  03/18/19 155 lb (70.3 kg)  03/05/19 156 lb 6.4 oz (70.9 kg)   Body mass index is 22.04 kg/m.   Physical Exam Constitutional:      General: He is not in acute distress.    Appearance: Normal appearance. He is not ill-appearing.  HENT:     Head: Normocephalic and atraumatic.  Musculoskeletal:     Comments: Mild erythema left first-third MTP joints associated with warmth and tenderness.   Tenderness dorsal aspect of foot up toward ankle.  Mild left ankle swelling.  No calf tenderness.  Increased pain with movement of toes and flexion of ankle.  Normal sensation left foot  Skin:    General: Skin is warm and dry.  Neurological:     Mental Status: He is alert.            Assessment & Plan:    See Problem List for Assessment and Plan of chronic medical problems.    This visit occurred during the SARS-CoV-2 public health emergency.  Safety protocols were in place, including screening questions prior to the visit, additional usage of staff PPE, and extensive cleaning of exam room while observing appropriate contact time as indicated for disinfecting solutions.

## 2019-03-24 NOTE — Assessment & Plan Note (Addendum)
Acute involving left first -third toes with warmth, mild erythema and tenderness Started after taking lasix Prednisone taper given allergy to colchicine and CKD Call if no improvement

## 2019-03-31 ENCOUNTER — Ambulatory Visit (HOSPITAL_COMMUNITY)
Admission: RE | Admit: 2019-03-31 | Discharge: 2019-03-31 | Disposition: A | Discharge status: Home / Self Care | Payer: Medicare Other | Source: Ambulatory Visit | Attending: Nephrology | Admitting: Nephrology

## 2019-03-31 VITALS — BP 177/86 | HR 69 | Resp 18

## 2019-03-31 DIAGNOSIS — N183 Chronic kidney disease, stage 3 unspecified: Secondary | ICD-10-CM | POA: Diagnosis present

## 2019-03-31 LAB — POCT HEMOGLOBIN-HEMACUE: Hemoglobin: 11.5 g/dL — ABNORMAL LOW (ref 13.0–17.0)

## 2019-03-31 MED ORDER — EPOETIN ALFA-EPBX 10000 UNIT/ML IJ SOLN
INTRAMUSCULAR | Status: AC
  Administered 2019-03-31: 20000 [IU]
  Filled 2019-03-31: qty 2

## 2019-03-31 MED ORDER — EPOETIN ALFA-EPBX 10000 UNIT/ML IJ SOLN: 20000.0000 [IU] | INTRAMUSCULAR | Status: DC

## 2019-04-03 ENCOUNTER — Other Ambulatory Visit: Payer: Self-pay

## 2019-04-03 ENCOUNTER — Ambulatory Visit (INDEPENDENT_AMBULATORY_CARE_PROVIDER_SITE_OTHER): Payer: Medicare Other | Admitting: *Deleted

## 2019-04-03 DIAGNOSIS — E538 Deficiency of other specified B group vitamins: Secondary | ICD-10-CM

## 2019-04-03 MED ORDER — CYANOCOBALAMIN 1000 MCG/ML IJ SOLN
1000.0000 ug | Freq: Once | INTRAMUSCULAR | Status: AC
  Administered 2019-04-03: 1000 ug via INTRAMUSCULAR

## 2019-04-03 NOTE — Progress Notes (Addendum)
Pls cosign for B12 inj..[name]   b12 Injection given.   Mike Rail, MD

## 2019-04-14 ENCOUNTER — Other Ambulatory Visit: Payer: Self-pay

## 2019-04-14 ENCOUNTER — Encounter (HOSPITAL_COMMUNITY): Payer: Medicare Other

## 2019-04-14 ENCOUNTER — Ambulatory Visit (INDEPENDENT_AMBULATORY_CARE_PROVIDER_SITE_OTHER): Payer: Medicare Other | Admitting: *Deleted

## 2019-04-14 ENCOUNTER — Encounter (HOSPITAL_COMMUNITY)
Admission: RE | Admit: 2019-04-14 | Discharge: 2019-04-14 | Disposition: A | Discharge status: Home / Self Care | Payer: Medicare Other | Source: Ambulatory Visit | Attending: Nephrology | Admitting: Nephrology

## 2019-04-14 VITALS — BP 167/87 | HR 81 | Resp 20

## 2019-04-14 DIAGNOSIS — D631 Anemia in chronic kidney disease: Secondary | ICD-10-CM | POA: Diagnosis not present

## 2019-04-14 DIAGNOSIS — I442 Atrioventricular block, complete: Secondary | ICD-10-CM | POA: Diagnosis not present

## 2019-04-14 DIAGNOSIS — N183 Chronic kidney disease, stage 3 unspecified: Secondary | ICD-10-CM | POA: Diagnosis not present

## 2019-04-14 LAB — CUP PACEART REMOTE DEVICE CHECK
Battery Impedance: 552 Ohm
Battery Remaining Longevity: 89 mo
Battery Voltage: 2.79 V
Brady Statistic AP VP Percent: 0 %
Brady Statistic AP VS Percent: 38 %
Brady Statistic AS VP Percent: 0 %
Brady Statistic AS VS Percent: 61 %
Date Time Interrogation Session: 20210406081013
Implantable Lead Implant Date: 20140321
Implantable Lead Implant Date: 20140321
Implantable Lead Location: 753859
Implantable Lead Location: 753860
Implantable Lead Model: 5076
Implantable Lead Model: 5092
Implantable Pulse Generator Implant Date: 20140321
Lead Channel Impedance Value: 391 Ohm
Lead Channel Impedance Value: 693 Ohm
Lead Channel Pacing Threshold Amplitude: 0.75 V
Lead Channel Pacing Threshold Amplitude: 0.75 V
Lead Channel Pacing Threshold Pulse Width: 0.4 ms
Lead Channel Pacing Threshold Pulse Width: 0.4 ms
Lead Channel Setting Pacing Amplitude: 2 V
Lead Channel Setting Pacing Amplitude: 2.5 V
Lead Channel Setting Pacing Pulse Width: 0.4 ms
Lead Channel Setting Sensing Sensitivity: 5.6 mV

## 2019-04-14 LAB — IRON AND TIBC
Iron: 40 ug/dL — ABNORMAL LOW (ref 45–182)
Saturation Ratios: 11 % — ABNORMAL LOW (ref 17.9–39.5)
TIBC: 350 ug/dL (ref 250–450)
UIBC: 310 ug/dL

## 2019-04-14 LAB — RENAL FUNCTION PANEL
Albumin: 3.4 g/dL — ABNORMAL LOW (ref 3.5–5.0)
Anion gap: 8 (ref 5–15)
BUN: 26 mg/dL — ABNORMAL HIGH (ref 8–23)
CO2: 23 mmol/L (ref 22–32)
Calcium: 8.8 mg/dL — ABNORMAL LOW (ref 8.9–10.3)
Chloride: 107 mmol/L (ref 98–111)
Creatinine, Ser: 1.51 mg/dL — ABNORMAL HIGH (ref 0.61–1.24)
GFR calc Af Amer: 46 mL/min — ABNORMAL LOW (ref >60)
GFR calc non Af Amer: 40 mL/min — ABNORMAL LOW (ref >60)
Glucose, Bld: 88 mg/dL (ref 70–99)
Phosphorus: 3.5 mg/dL (ref 2.5–4.6)
Potassium: 4.7 mmol/L (ref 3.5–5.1)
Sodium: 138 mmol/L (ref 135–145)

## 2019-04-14 LAB — POCT HEMOGLOBIN-HEMACUE: Hemoglobin: 11.5 g/dL — ABNORMAL LOW (ref 13.0–17.0)

## 2019-04-14 LAB — FERRITIN: Ferritin: 33 ng/mL (ref 24–336)

## 2019-04-14 MED ORDER — EPOETIN ALFA-EPBX 10000 UNIT/ML IJ SOLN
INTRAMUSCULAR | Status: AC
  Administered 2019-04-14: 20000 [IU] via SUBCUTANEOUS
  Filled 2019-04-14: qty 2

## 2019-04-14 MED ORDER — EPOETIN ALFA-EPBX 10000 UNIT/ML IJ SOLN: 20000.0000 [IU] | INTRAMUSCULAR | Status: DC

## 2019-04-15 NOTE — Progress Notes (Signed)
PPM Remote  

## 2019-04-22 ENCOUNTER — Other Ambulatory Visit (HOSPITAL_COMMUNITY): Payer: Self-pay | Admitting: Urology

## 2019-04-22 ENCOUNTER — Other Ambulatory Visit: Payer: Self-pay | Admitting: Urology

## 2019-04-22 DIAGNOSIS — R9721 Rising PSA following treatment for malignant neoplasm of prostate: Secondary | ICD-10-CM

## 2019-04-23 ENCOUNTER — Other Ambulatory Visit: Payer: Self-pay | Admitting: Cardiovascular Disease

## 2019-04-27 ENCOUNTER — Other Ambulatory Visit (HOSPITAL_COMMUNITY): Payer: Self-pay | Admitting: *Deleted

## 2019-04-28 ENCOUNTER — Ambulatory Visit (HOSPITAL_COMMUNITY)
Admission: RE | Admit: 2019-04-28 | Discharge: 2019-04-28 | Disposition: A | Discharge status: Home / Self Care | Payer: Medicare Other | Source: Ambulatory Visit | Attending: Nephrology | Admitting: Nephrology

## 2019-04-28 ENCOUNTER — Ambulatory Visit (INDEPENDENT_AMBULATORY_CARE_PROVIDER_SITE_OTHER): Payer: Medicare Other | Admitting: Neurology

## 2019-04-28 ENCOUNTER — Other Ambulatory Visit: Payer: Self-pay

## 2019-04-28 VITALS — BP 163/81 | HR 72 | Temp 97.6°F | Resp 20

## 2019-04-28 DIAGNOSIS — G5623 Lesion of ulnar nerve, bilateral upper limbs: Secondary | ICD-10-CM

## 2019-04-28 DIAGNOSIS — R29898 Other symptoms and signs involving the musculoskeletal system: Secondary | ICD-10-CM | POA: Diagnosis not present

## 2019-04-28 DIAGNOSIS — N183 Chronic kidney disease, stage 3 unspecified: Secondary | ICD-10-CM | POA: Insufficient documentation

## 2019-04-28 DIAGNOSIS — G5603 Carpal tunnel syndrome, bilateral upper limbs: Secondary | ICD-10-CM

## 2019-04-28 LAB — POCT HEMOGLOBIN-HEMACUE: Hemoglobin: 10.5 g/dL — ABNORMAL LOW (ref 13.0–17.0)

## 2019-04-28 LAB — RENAL FUNCTION PANEL
Albumin: 3.6 g/dL (ref 3.5–5.0)
Anion gap: 6 (ref 5–15)
BUN: 25 mg/dL — ABNORMAL HIGH (ref 8–23)
CO2: 23 mmol/L (ref 22–32)
Calcium: 8.6 mg/dL — ABNORMAL LOW (ref 8.9–10.3)
Chloride: 109 mmol/L (ref 98–111)
Creatinine, Ser: 1.62 mg/dL — ABNORMAL HIGH (ref 0.61–1.24)
GFR calc Af Amer: 43 mL/min — ABNORMAL LOW (ref >60)
GFR calc non Af Amer: 37 mL/min — ABNORMAL LOW (ref >60)
Glucose, Bld: 93 mg/dL (ref 70–99)
Phosphorus: 3.6 mg/dL (ref 2.5–4.6)
Potassium: 4.7 mmol/L (ref 3.5–5.1)
Sodium: 138 mmol/L (ref 135–145)

## 2019-04-28 MED ORDER — EPOETIN ALFA-EPBX 10000 UNIT/ML IJ SOLN
INTRAMUSCULAR | Status: AC
  Administered 2019-04-28: 20000 [IU] via SUBCUTANEOUS
  Filled 2019-04-28: qty 2

## 2019-04-28 MED ORDER — SODIUM CHLORIDE 0.9 % IV SOLN
510.0000 mg | INTRAVENOUS | Status: DC
  Administered 2019-04-28: 510 mg via INTRAVENOUS
  Filled 2019-04-28: qty 17

## 2019-04-28 MED ORDER — EPOETIN ALFA-EPBX 10000 UNIT/ML IJ SOLN: 20000.0000 [IU] | INTRAMUSCULAR | Status: DC

## 2019-04-28 NOTE — Procedures (Signed)
[facility]  [address]  [place], [place] [address] Tel: [phone] Fax:  [phone] Test Date:  04/28/2019  Patient: [name] DOB: 1926-12-14 Physician: Narda Amber, DO  Sex: Male Height: 5\' 11"  Ref Phys: Metta Clines, D.O.  ID#: [id number] Temp: 32.0C Technician:    Patient Complaints: This is a 86 year old man with history of left ulnar transposition referred for evaluation of bilateral hand paresthesias and weakness.  NCV & EMG Findings: Extensive electrodiagnostic testing of the right upper extremity and additional studies of the left shows:  1. Bilateral median and right ulnar sensory responses show prolonged latency (R4.3, L3.9, R3.5 ms).  Left ulnar sensory responses within normal limits 2. Bilateral median motor responses are within normal limits.  Bilateral ulnar motor responses show slowed conduction velocity across the elbow (A Elbow-B Elbow, R37, L42 m/s).  3. There is no evidence of active or chronic motor axonal loss changes affecting any of the tested muscles.  Motor unit configuration and recruitment pattern is within normal limits.  Impression: 1. Bilateral median neuropathy at or distal to the wrist, consistent with a clinical diagnosis of carpal tunnel syndrome.  Overall, these findings are mild in degree electrically. 2. Bilateral ulnar neuropathy with slowing across the elbow, moderate and worse on the left.    ___________________________ Narda Amber, DO    Nerve Conduction Studies Anti Sensory Summary Table   Stim Site NR Peak (ms) Norm Peak (ms) P-T Amp (V) Norm P-T Amp  Left Median Anti Sensory (2nd Digit)  32C  Wrist    3.9 <3.8 10.9 >10  Right Median Anti Sensory (2nd Digit)  32C  Wrist    4.3 <3.8 14.6 >10  Left Ulnar Anti Sensory (5th Digit)  32C  Wrist    3.2 <3.2 10.2 >5  Right Ulnar Anti Sensory (5th Digit)  32C  Wrist    3.5 <3.2 10.3 >5   Motor Summary Table   Stim Site NR Onset (ms) Norm Onset  (ms) O-P Amp (mV) Norm O-P Amp Site1 Site2 Delta-0 (ms) Dist (cm) Vel (m/s) Norm Vel (m/s)  Left Median Motor (Abd Poll Brev)  32C  Wrist    3.4 <4.0 7.9 >5 Elbow Wrist 5.9 30.0 51 >50  Elbow    9.3  7.3         Right Median Motor (Abd Poll Brev)  32C  Wrist    3.9 <4.0 7.0 >5 Elbow Wrist 6.4 32.0 50 >50  Elbow    10.3  6.0         Left Ulnar Motor (Abd Dig Minimi)  32C  Wrist    3.1 <3.1 6.4 >7 B Elbow Wrist 4.4 26.0 59 >50  B Elbow    7.5  4.6  A Elbow B Elbow 2.4 10.0 42 >50  A Elbow    9.9  4.3         Right Ulnar Motor (Abd Dig Minimi)  32C  Wrist    3.0 <3.1 7.2 >7 B Elbow Wrist 4.7 24.0 51 >50  B Elbow    7.7  6.2  A Elbow B Elbow 2.7 10.0 37 >50  A Elbow    10.4  5.9          EMG   Side Muscle Ins Act Fibs Psw Fasc Number Recrt Dur Dur. Amp Amp. Poly Poly. Comment  Right 1stDorInt Nml Nml Nml Nml Nml Nml Nml Nml Nml Nml Nml Nml N/A  Right Abd Poll Brev Nml  Nml Nml Nml Nml Nml Nml Nml Nml Nml Nml Nml N/A  Right PronatorTeres Nml Nml Nml Nml Nml Nml Nml Nml Nml Nml Nml Nml N/A  Right Biceps Nml Nml Nml Nml Nml Nml Nml Nml Nml Nml Nml Nml N/A  Right Triceps Nml Nml Nml Nml Nml Nml Nml Nml Nml Nml Nml Nml N/A  Right Deltoid Nml Nml Nml Nml Nml Nml Nml Nml Nml Nml Nml Nml N/A  Right FlexDigProf 4,5 Nml Nml Nml Nml Nml Nml Nml Nml Nml Nml Nml Nml N/A  Left Abd Poll Brev Nml Nml Nml Nml Nml Nml Nml Nml Nml Nml Nml Nml N/A  Left PronatorTeres Nml Nml Nml Nml Nml Nml Nml Nml Nml Nml Nml Nml N/A  Left Biceps Nml Nml Nml Nml Nml Nml Nml Nml Nml Nml Nml Nml N/A  Left Triceps Nml Nml Nml Nml Nml Nml Nml Nml Nml Nml Nml Nml N/A  Left Deltoid Nml Nml Nml Nml Nml Nml Nml Nml Nml Nml Nml Nml N/A  Left FlexDigProf 4,5 Nml Nml Nml Nml Nml Nml Nml Nml Nml Nml Nml Nml N/A  Left 1stDorInt Nml Nml Nml Nml Nml Nml Nml Nml Nml Nml Nml Nml N/A      Waveforms:

## 2019-04-30 ENCOUNTER — Other Ambulatory Visit: Payer: Self-pay | Admitting: Internal Medicine

## 2019-05-04 ENCOUNTER — Other Ambulatory Visit: Payer: Self-pay

## 2019-05-04 ENCOUNTER — Ambulatory Visit (INDEPENDENT_AMBULATORY_CARE_PROVIDER_SITE_OTHER): Payer: Medicare Other | Admitting: *Deleted

## 2019-05-04 DIAGNOSIS — E538 Deficiency of other specified B group vitamins: Secondary | ICD-10-CM | POA: Diagnosis not present

## 2019-05-04 MED ORDER — CYANOCOBALAMIN 1000 MCG/ML IJ SOLN
1000.0000 ug | Freq: Once | INTRAMUSCULAR | Status: AC
  Administered 2019-05-04: 1000 ug via INTRAMUSCULAR

## 2019-05-04 NOTE — Progress Notes (Signed)
Pls cosign for B12 inj../lmb  

## 2019-05-06 ENCOUNTER — Encounter (HOSPITAL_COMMUNITY)
Admission: RE | Admit: 2019-05-06 | Discharge: 2019-05-06 | Disposition: A | Discharge status: Home / Self Care | Payer: Medicare Other | Source: Ambulatory Visit | Attending: Urology | Admitting: Urology

## 2019-05-06 ENCOUNTER — Other Ambulatory Visit: Payer: Self-pay

## 2019-05-06 DIAGNOSIS — R9721 Rising PSA following treatment for malignant neoplasm of prostate: Secondary | ICD-10-CM | POA: Insufficient documentation

## 2019-05-06 MED ORDER — TECHNETIUM TC 99M MEDRONATE IV KIT
22.0000 | PACK | Freq: Once | INTRAVENOUS | Status: AC
  Administered 2019-05-06: 22 via INTRAVENOUS

## 2019-05-12 ENCOUNTER — Other Ambulatory Visit: Payer: Self-pay

## 2019-05-12 ENCOUNTER — Ambulatory Visit (HOSPITAL_COMMUNITY)
Admission: RE | Admit: 2019-05-12 | Discharge: 2019-05-12 | Disposition: A | Discharge status: Home / Self Care | Payer: Medicare Other | Source: Ambulatory Visit | Attending: Nephrology | Admitting: Nephrology

## 2019-05-12 VITALS — BP 151/70 | HR 69 | Temp 98.3°F | Resp 20

## 2019-05-12 DIAGNOSIS — N183 Chronic kidney disease, stage 3 unspecified: Secondary | ICD-10-CM | POA: Insufficient documentation

## 2019-05-12 LAB — POCT HEMOGLOBIN-HEMACUE: Hemoglobin: 11.4 g/dL — ABNORMAL LOW (ref 13.0–17.0)

## 2019-05-12 MED ORDER — EPOETIN ALFA-EPBX 10000 UNIT/ML IJ SOLN
INTRAMUSCULAR | Status: AC
  Administered 2019-05-12: 13:00:00 20000 [IU] via SUBCUTANEOUS
  Filled 2019-05-12: qty 2

## 2019-05-12 MED ORDER — SODIUM CHLORIDE 0.9 % IV SOLN
510.0000 mg | INTRAVENOUS | Status: AC
  Administered 2019-05-12: 510 mg via INTRAVENOUS
  Filled 2019-05-12: qty 17

## 2019-05-12 MED ORDER — EPOETIN ALFA-EPBX 10000 UNIT/ML IJ SOLN: 20000.0000 [IU] | INTRAMUSCULAR | Status: DC

## 2019-05-26 ENCOUNTER — Ambulatory Visit (HOSPITAL_COMMUNITY)
Admission: RE | Admit: 2019-05-26 | Discharge: 2019-05-26 | Disposition: A | Discharge status: Home / Self Care | Payer: Medicare Other | Source: Ambulatory Visit | Attending: Nephrology | Admitting: Nephrology

## 2019-05-26 ENCOUNTER — Other Ambulatory Visit: Payer: Self-pay

## 2019-05-26 VITALS — BP 172/82 | HR 69 | Temp 97.2°F | Resp 20

## 2019-05-26 DIAGNOSIS — D631 Anemia in chronic kidney disease: Secondary | ICD-10-CM | POA: Diagnosis not present

## 2019-05-26 DIAGNOSIS — N183 Chronic kidney disease, stage 3 unspecified: Secondary | ICD-10-CM | POA: Insufficient documentation

## 2019-05-26 LAB — IRON AND TIBC
Iron: 81 ug/dL (ref 45–182)
Saturation Ratios: 31 % (ref 17.9–39.5)
TIBC: 258 ug/dL (ref 250–450)
UIBC: 177 ug/dL

## 2019-05-26 LAB — POCT HEMOGLOBIN-HEMACUE: Hemoglobin: 12.6 g/dL — ABNORMAL LOW (ref 13.0–17.0)

## 2019-05-26 LAB — FERRITIN: Ferritin: 216 ng/mL (ref 24–336)

## 2019-05-26 MED ORDER — EPOETIN ALFA-EPBX 10000 UNIT/ML IJ SOLN: 20000.0000 [IU] | INTRAMUSCULAR | Status: DC

## 2019-06-02 ENCOUNTER — Telehealth: Payer: Self-pay | Admitting: Internal Medicine

## 2019-06-02 NOTE — Telephone Encounter (Signed)
Ryland is calling in regards to the 6 weeks of BP readings that was given to Dr. Rayann Heman on 05/26/19 wanting to know what he advises in regards to it due to never being called like the nurse advised him.

## 2019-06-03 ENCOUNTER — Other Ambulatory Visit: Payer: Self-pay

## 2019-06-03 ENCOUNTER — Ambulatory Visit (INDEPENDENT_AMBULATORY_CARE_PROVIDER_SITE_OTHER): Payer: Medicare Other | Admitting: *Deleted

## 2019-06-03 DIAGNOSIS — E538 Deficiency of other specified B group vitamins: Secondary | ICD-10-CM | POA: Diagnosis not present

## 2019-06-03 MED ORDER — CYANOCOBALAMIN 1000 MCG/ML IJ SOLN
1000.0000 ug | Freq: Once | INTRAMUSCULAR | Status: AC
  Administered 2019-06-03: 1000 ug via INTRAMUSCULAR

## 2019-06-03 NOTE — Progress Notes (Signed)
Pls cosign for B12 inj../lmb  

## 2019-06-05 MED ORDER — AMLODIPINE BESYLATE 5 MG PO TABS: 5.0000 mg | ORAL_TABLET | Freq: Every day | ORAL | 3 refills | Status: DC

## 2019-06-05 NOTE — Addendum Note (Signed)
Addended by: Willeen Cass A on: 06/05/2019 01:32 PM   Modules accepted: Orders

## 2019-06-05 NOTE — Telephone Encounter (Signed)
Patient calling back.   °

## 2019-06-05 NOTE — Telephone Encounter (Signed)
Call placed to Pt.  Advised Dr. Rayann Heman was ok with Pt's blood pressures.  Per Pt he thought he might be having more good days than bad days off the metoprolol.  Discussed with Dr. Burt Knack.  Per Dr. Burt Knack, if he feels better off the metoprolol see if Pt is willing to increase amlodipine to 5 mg daily  Pt in agreement with amlodipine increase, has upcoming appt with Dr. Burt Knack and will increase amlodipine and continue to track blood pressure to bring with him to upcoming appt.

## 2019-06-09 ENCOUNTER — Encounter (HOSPITAL_COMMUNITY)
Admission: RE | Admit: 2019-06-09 | Discharge: 2019-06-09 | Disposition: A | Discharge status: Home / Self Care | Payer: Medicare Other | Source: Ambulatory Visit | Attending: Nephrology | Admitting: Nephrology

## 2019-06-09 ENCOUNTER — Other Ambulatory Visit: Payer: Self-pay

## 2019-06-09 VITALS — BP 159/78 | HR 74 | Temp 97.3°F | Resp 20

## 2019-06-09 DIAGNOSIS — N183 Chronic kidney disease, stage 3 unspecified: Secondary | ICD-10-CM

## 2019-06-09 DIAGNOSIS — D631 Anemia in chronic kidney disease: Secondary | ICD-10-CM | POA: Insufficient documentation

## 2019-06-09 LAB — POCT HEMOGLOBIN-HEMACUE: Hemoglobin: 12.5 g/dL — ABNORMAL LOW (ref 13.0–17.0)

## 2019-06-09 MED ORDER — EPOETIN ALFA-EPBX 10000 UNIT/ML IJ SOLN: 20000.0000 [IU] | INTRAMUSCULAR | Status: DC

## 2019-06-23 ENCOUNTER — Other Ambulatory Visit: Payer: Self-pay

## 2019-06-23 ENCOUNTER — Encounter (HOSPITAL_COMMUNITY)
Admission: RE | Admit: 2019-06-23 | Discharge: 2019-06-23 | Disposition: A | Discharge status: Home / Self Care | Payer: Medicare Other | Source: Ambulatory Visit | Attending: Nephrology | Admitting: Nephrology

## 2019-06-23 VITALS — BP 139/61 | HR 64

## 2019-06-23 DIAGNOSIS — N183 Chronic kidney disease, stage 3 unspecified: Secondary | ICD-10-CM | POA: Diagnosis not present

## 2019-06-23 LAB — IRON AND TIBC
Iron: 109 ug/dL (ref 45–182)
Saturation Ratios: 43 % — ABNORMAL HIGH (ref 17.9–39.5)
TIBC: 255 ug/dL (ref 250–450)
UIBC: 146 ug/dL

## 2019-06-23 LAB — FERRITIN: Ferritin: 232 ng/mL (ref 24–336)

## 2019-06-23 LAB — POCT HEMOGLOBIN-HEMACUE: Hemoglobin: 13.1 g/dL (ref 13.0–17.0)

## 2019-06-23 MED ORDER — EPOETIN ALFA-EPBX 10000 UNIT/ML IJ SOLN: 20000.0000 [IU] | INTRAMUSCULAR | Status: DC

## 2019-06-30 ENCOUNTER — Telehealth (INDEPENDENT_AMBULATORY_CARE_PROVIDER_SITE_OTHER): Payer: Medicare Other | Admitting: Family

## 2019-06-30 DIAGNOSIS — R1032 Left lower quadrant pain: Secondary | ICD-10-CM | POA: Diagnosis not present

## 2019-06-30 DIAGNOSIS — R109 Unspecified abdominal pain: Secondary | ICD-10-CM | POA: Diagnosis not present

## 2019-06-30 DIAGNOSIS — M79605 Pain in left leg: Secondary | ICD-10-CM

## 2019-06-30 NOTE — Patient Instructions (Addendum)
  Medications reviewed and updated.  Changes include :   none    A referral was ordered for surgery. Someone will call you to schedule this.

## 2019-06-30 NOTE — Progress Notes (Signed)
[name] is a 86 y.o. male with the following history as recorded in EpicCare:  Patient Active Problem List   Diagnosis Date Noted   Weight loss 06/18/2018   Myositis 06/18/2018   Gout involving toe of left foot 05/12/2018   Elevated CK 05/12/2018   Myoclonic jerking 02/10/2018   Chronic diastolic CHF (congestive heart failure) ([place]) 02/05/2018   UGIB (upper gastrointestinal bleed) 02/01/2018   Lumbar post-laminectomy syndrome 12/12/2017   Pain of left hip joint 11/29/2017   Trochanteric bursitis of left hip 11/29/2017   Hyperpigmentation 11/04/2017   Spinal stenosis of lumbar region 10/09/2017   Abnormal appearance of skin 10/07/2017   Frequent urination 09/03/2017   Gouty arthritis of right great toe 09/03/2017   Postnasal drip 08/06/2017   Rash and nonspecific skin eruption 08/06/2017   Moderate aortic regurgitation 71/21/9758   Diastolic dysfunction 83/25/4982   Edema 06/29/2017   DOE (dyspnea on exertion) 06/29/2017   Sinus symptom 06/05/2017   Fatigue 05/18/2017   Sleeping difficulty 05/18/2017   Foot pain, bilateral 04/22/2017   Trochanteric bursitis 01/10/2017   Internal hemorrhoids 08/15/2016   Dysphagia 02/06/2016   Cephalalgia 07/14/2015   Constipation 03/01/2015   Myalgia 03/02/2014   CHB (complete heart block) ([place]) 05/03/2012   Postoperative atrial fibrillation ([place]) 05/03/2012   Pacemaker 04/10/2012   AV block, 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 03/26/2012   Coronary atherosclerosis 11/27/2011   Presence of aortocoronary bypass graft 10/24/2011   Pulmonary nodule    CAD (coronary artery disease)    CKD (chronic kidney disease) stage 3, GFR 30-59 ml/min (HCC)    Venous insufficiency of leg 06/05/2010   SHOULDER PAIN, RIGHT, CHRONIC 10/14/2009   B12 deficiency 08/23/2009   Peripheral neuropathy 03/21/2009   DEGENERATIVE DISC DISEASE, CERVICAL SPINE, W/RADICULOPATHY 09/21/2008   Dyslipidemia  01/17/2007   Essential hypertension 07/20/2006   Prostate cancer ([place]) 07/20/2006    Current Outpatient Medications  Medication Sig Dispense Refill   amLODipine (NORVASC) 5 MG tablet Take 1 tablet (5 mg total) by mouth daily. 90 tablet 3   Aromatic Inhalants (VICKS VAPOR INHALER IN) Place 1 puff into both nostrils as needed (for congestion).     aspirin EC 81 MG tablet Take 81 mg by mouth daily.     Carboxymeth-Glycerin-Polysorb (REFRESH OPTIVE MEGA-3 OP) Place 1 drop into both eyes 2 (two) times daily.     Cyanocobalamin (VITAMIN B-12 IJ) Inject 1 mL as directed every 30 (thirty) days.     epoetin alfa-epbx (RETACRIT) 2000 UNIT/ML injection 2,000 Units every 14 (fourteen) days.     fluticasone (FLONASE) 50 MCG/ACT nasal spray Place 2 sprays into both nostrils daily. 16 g 6   folic acid (FOLVITE) 1 MG tablet Take 1 tablet (1 mg total) by mouth daily. Annual appt due in May must see provider for future refills 90 tablet 0   furosemide (LASIX) 40 MG tablet Take 1 tablet (40 mg total) by mouth as needed for fluid or edema. 90 tablet 3   loratadine (CLARITIN) 10 MG tablet Take 10 mg by mouth daily as needed (for seasonal allergies).     Multiple Vitamins-Minerals (PRESERVISION AREDS 2 PO) Take 1 capsule by mouth 2 (two) times daily.      nitroGLYCERIN (NITROSTAT) 0.4 MG SL tablet DISSOLVE 1 TABLET UNDER THE TONGUE EVERY 5 MINUTES AS NEEDED FOR CHEST PAIN, MAXIMUM 3 TABLETS 100 tablet 1   predniSONE (DELTASONE) 10 MG tablet Take 4 tabs po qd x 3 days, then 3 tabs  po qd x 3 days, then 2 tabs po qd x 3 days, then 1 tab po qd x 3 days 30 tablet 0   triamcinolone cream (KENALOG) 0.1 % Apply 1 application topically as needed (for itching- to affected sites).      vitamin C (ASCORBIC ACID) 500 MG tablet Take 1,000 mg by mouth daily.      Vitamin D, Ergocalciferol, (DRISDOL) 1.25 MG (50000 UT) CAPS capsule Take 50,000 Units by mouth every 30 (thirty) days.      Zinc 50 MG CAPS Take 50 mg  by mouth at bedtime.      No current facility-administered medications for this visit.    Allergies: Aspirin, Lisinopril, Amoxicillin, Atarax [hydroxyzine hcl], Cephalexin, Ciprofloxacin, Clindamycin, Clobetasol, Codeine, Fish allergy, Fluarix [flu virus vaccine], Haemophilus influenzae, Hydrocodone, Hydrocodone-acetaminophen, Hydroxyzine, Latex, Macrobid [nitrofurantoin macrocrystal], Niacin, Niacin-lovastatin er, Niacin-lovastatin er, Nitrofurantoin, Omeprazole, Vibramycin [doxycycline], Adhesive [tape], Bactrim [sulfamethoxazole-trimethoprim], Colchicine, and Gabapentin  Past Medical History:  Diagnosis Date   Acute superficial venous thrombosis of lower extremity    a. RLE after CABG, neg dopp for DVT.   Allergy    Anemia    Atrial fibrillation ([place])    a. Post-op from CABG, on amiodarone temporarily, d/c'd 12/2011   CAD (coronary artery disease)    a. S/P stenting to mid RCA, prox PDA 06/1999. b. NSTEMI/CABG x 3 in 10/2011 with LIMA to LAD, SVG to PDA, and SVG to OM1.    Chronic UTI    a. Followed by Dr. Risa Grill - colonized/asymptomatic - not on abx   CKD (chronic kidney disease), stage III    a. stable with a creatinine around 1.9-2.0 followed by nephrology.   Dyslipidemia    Echocardiogram    Echocardiogram 08/2018: EF 50-55, inf-lat AK, mild LVH, Gr 1 DD, RVSP 47, mild LAE, mild to mod MR, mild AI, mild AS (mean 11)   GERD (gastroesophageal reflux disease)    Hypertension    well-controlled.   Myocardial infarction ([place])    Neck injury    a. C3-C4 and C4-C5 foraminal narrowing, severe   Prostate cancer ([place])    a. 2001 s/p TURP.   Pulmonary nodule    a. felt to be noncancerous.  Status post followup CT scan 4 mm and stable.   Renal artery stenosis (HCC)    a. 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    Past Surgical History:  Procedure Laterality Date   ANTERIOR CHAMBER WASHOUT Left 10/04/2018    Procedure: Anterior Chamber Washout, Vitreous Tap;  Surgeon: Jalene Mullet, MD;  Location: [place];  Service: Ophthalmology;  Laterality: Left;   cardia catherization  07-07-99   CARDIAC SURGERY  10/18/12   open heart surgery   CATARACT EXTRACTION W/ INTRAOCULAR LENS  IMPLANT, BILATERAL  3/205, 06/2013   mccuen   COLONOSCOPY  04/12/07   CORONARY ARTERY BYPASS GRAFT  10/19/2011   Procedure: CORONARY ARTERY BYPASS GRAFTING (CABG);  Surgeon: Gaye Pollack, MD;  Location: [place];  Service: Open Heart Surgery;  Laterality: N/A;  times three using Left Internal Mammary Artery and Right Greater Saphenouse Vein Graft harvested Endoscopically   edg  07-17-1994   FLEXIBLE SIGMOIDOSCOPY  11-03-1997   GAS INSERTION Left 10/04/2018   Procedure: Insertion Of Gas;  Surgeon: Jalene Mullet, MD;  Location: [place];  Service: Ophthalmology;  Laterality: Left;   GAS/FLUID EXCHANGE Left 10/04/2018   Procedure: Gas/Fluid Exchange;  Surgeon: Jalene Mullet,  MD;  Location: [place];  Service: Ophthalmology;  Laterality: Left;   LEFT HEART CATHETERIZATION WITH CORONARY ANGIOGRAM N/A 10/16/2011   Procedure: LEFT HEART CATHETERIZATION WITH CORONARY ANGIOGRAM;  Surgeon: Peter M Martinique, MD;  Location: [facility] CATH LAB;  Service: Cardiovascular;  Laterality: N/A;   LEFT HEART CATHETERIZATION WITH CORONARY/GRAFT ANGIOGRAM N/A 03/11/2013   Procedure: LEFT HEART CATHETERIZATION WITH [name];  Surgeon: Blane Ohara, MD;  Location: [facility] CATH LAB;  Service: Cardiovascular;  Laterality: N/A;   lumbar spinal disk and neck fusion surgery     PACEMAKER INSERTION  03/28/12   PPM implanted for mobitz II AV block   PARS PLANA VITRECTOMY Left 10/04/2018   Procedure: PARS PLANA VITRECTOMY 25 GAUGE FOR ENDOPHTHALMITIS WITH INJECTION OF INTRAVITREAL ANTIBIOTIC;  Surgeon: Jalene Mullet, MD;  Location: [place];  Service: Ophthalmology;  Laterality: Left;   peripheral vascular catherization  11-24-03   PERMANENT PACEMAKER  INSERTION N/A 03/28/2012   Procedure: PERMANENT PACEMAKER INSERTION;  Surgeon: Thompson Grayer, MD;  Location: [facility] CATH LAB;  Service: Cardiovascular;  Laterality: N/A;   PROSTATECTOMY     renal circulation  10-01-03   s/p ptca     stents     X 2   stress cardiolite  05-04-05   spring 09-negative except for apical thinning, EF 68%    Family History  Problem Relation Age of Onset   Coronary artery disease Father        died @ 44   Other Mother        cerebral hemorrhage - died @ 70   Arthritis Mother    Cancer Brother        Bladder   Prostate cancer Brother    Nephritis Brother        died @ age 72.   Other Brother        cerebral hemorrhage - died @ 70   Heart disease Brother    Breast cancer Other        niece x 2   Diabetes Neg Hx    Colon cancer Neg Hx    Adrenal disorder Neg Hx     Social History   Tobacco Use   Smoking status: Never Smoker   Smokeless tobacco: Never Used  Substance Use Topics   Alcohol use: Yes    Comment: Rarely    Subjective:   I connected with [name] on 06/30/19 at 12:00 PM EDT by a video enabled telemedicine application and verified that I am speaking with the correct person using two identifiers.   I discussed the limitations of evaluation and management by telemedicine and the availability of in person appointments. The patient expressed understanding and agreed to proceed. Provider in office/ patient is at home; provider and patient are only 2 people on video call.   Patient notes that last Friday he was having some abdominal discomfort and left groin pain; he admits that today he is actually feeling much better- has been having normal bowel movements; denies any nausea or vomiting or fever; no prior history of diverticulitis; admits that family had been visiting when the symptoms started and he had been eating a lot more junk food than he is used to;  Does try to exercise regularly at the gym where he lives and  readily admits that he could have pulled a muscle;    Objective:  There were no vitals filed for this visit.  General: Well developed, well nourished, in no acute distress  Head: Normocephalic  and atraumatic  Lungs: Respirations unlabored;  Neurologic: Alert and oriented; speech intact;   Assessment:  1. Acute abdominal pain   2. Left leg pain     Plan:  Patient is very well appearing and notes that symptoms are actually starting to resolve; feels much better today than he has in the past few days; he understands these type of symptoms cannot be easily diagnosed/ treated with a virtual visit; Will watch and wait for now- work on healthy diet/ water and schedule in office visit with his PCP if the symptoms persist.  This visit occurred during the SARS-CoV-2 public health emergency.  Safety protocols were in place, including screening questions prior to the visit, additional usage of staff PPE, and extensive cleaning of exam room while observing appropriate contact time as indicated for disinfecting solutions.     No follow-ups on file.  No orders of the defined types were placed in this encounter.   Requested Prescriptions    No prescriptions requested or ordered in this encounter

## 2019-06-30 NOTE — Progress Notes (Signed)
Subjective:    Patient ID: [name], male    DOB: [birth year], 86 y.o.   MRN: [id number]  HPI The patient is here for follow up of their chronic medical problems, including fatigue, weakness, CKD, iron def anemia, htn  He was seen yesterday by laura.   He is following with ortho, neurology. Cardiology, urology and nephrology, ent  He started getting twinges Friday afternoon or evening and were more frequent Sunday.  Twinges of burning in his left groin region.  It occasionally radiates across his suprapubic region.  If he puts pressure on it it lets up and stops. Sometimes it is worse with cough or changing position. It did not bother him at all last night in bed.   He has some looser stools the past few days.  No obvious blood.  No fever or nausea.  He may have some abdominal pain. He has not been eating as healthy these past few days.   Medications and allergies reviewed with patient and updated if appropriate.  Patient Active Problem List   Diagnosis Date Noted  . Weight loss 06/18/2018  . Myositis 06/18/2018  . Gout involving toe of left foot 05/12/2018  . Elevated CK 05/12/2018  . Myoclonic jerking 02/10/2018  . Chronic diastolic CHF (congestive heart failure) ([place]) 02/05/2018  . UGIB (upper gastrointestinal bleed) 02/01/2018  . Lumbar post-laminectomy syndrome 12/12/2017  . Pain of left hip joint 11/29/2017  . Trochanteric bursitis of left hip 11/29/2017  . Hyperpigmentation 11/04/2017  . Spinal stenosis of lumbar region 10/09/2017  . Abnormal appearance of skin 10/07/2017  . Frequent urination 09/03/2017  . Gouty arthritis of right great toe 09/03/2017  . Postnasal drip 08/06/2017  . Rash and nonspecific skin eruption 08/06/2017  . Moderate aortic regurgitation 07/04/2017  . Diastolic dysfunction 44/92/0100  . Edema 06/29/2017  . DOE (dyspnea on exertion) 06/29/2017  . Sinus symptom 06/05/2017  . Fatigue 05/18/2017  . Sleeping difficulty 05/18/2017  .  Foot pain, bilateral 04/22/2017  . Trochanteric bursitis 01/10/2017  . Internal hemorrhoids 08/15/2016  . Dysphagia 02/06/2016  . Cephalalgia 07/14/2015  . Constipation 03/01/2015  . Myalgia 03/02/2014  . CHB (complete heart block) ([place]) 05/03/2012  . Postoperative atrial fibrillation ([place]) 05/03/2012  . Pacemaker 04/10/2012  . 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 Coronary atherosclerosis 11/27/2011  . Presence of aortocoronary bypass graft 10/24/2011  . Pulmonary nodule   . CAD (coronary artery disease)   . CKD (chronic kidney disease) stage 3, GFR 30-59 ml/min (HCC)   . Venous insufficiency of leg 06/05/2010  . SHOULDER PAIN, RIGHT, CHRONIC 10/14/2009  . B12 deficiency 08/23/2009  . Peripheral neuropathy 03/21/2009  . DEGENERATIVE DISC DISEASE, CERVICAL SPINE, W/RADICULOPATHY 09/21/2008  . Dyslipidemia 01/17/2007  . Essential hypertension 07/20/2006  . Prostate cancer ([place]) 07/20/2006    Current Outpatient Medications on File Prior to Visit  Medication Sig Dispense Refill  . amLODipine (NORVASC) 5 MG tablet Take 1 tablet (5 mg total) by mouth daily. 90 tablet 3  . Aromatic Inhalants (VICKS VAPOR INHALER IN) Place 1 puff into both nostrils as needed (for congestion).    [name] aspirin EC 81 MG tablet Take 81 mg by mouth daily.    . Carboxymeth-Glycerin-Polysorb (REFRESH OPTIVE MEGA-3 OP) Place 1 drop into both eyes 2 (two) times daily.    . Cyanocobalamin (VITAMIN B-12 IJ) Inject 1 mL as directed every 30 (thirty) days.    [name] epoetin alfa-epbx (RETACRIT) 2000 UNIT/ML injection  2,000 Units every 14 (fourteen) days. Patient states he only gets it when he needs it but not every 2 weeks    . fluticasone (FLONASE) 50 MCG/ACT nasal spray Place 2 sprays into both nostrils daily. 16 g 6  . folic acid (FOLVITE) 1 MG tablet Take 1 tablet (1 mg total) by mouth daily. Annual appt due in May must see provider for future refills 90 tablet 0  . loratadine (CLARITIN) 10 MG  tablet Take 10 mg by mouth daily as needed (for seasonal allergies).    . Multiple Vitamins-Minerals (PRESERVISION AREDS 2 PO) Take 1 capsule by mouth 2 (two) times daily.     . nitroGLYCERIN (NITROSTAT) 0.4 MG SL tablet DISSOLVE 1 TABLET UNDER THE TONGUE EVERY 5 MINUTES AS NEEDED FOR CHEST PAIN, MAXIMUM 3 TABLETS 100 tablet 1  . triamcinolone cream (KENALOG) 0.1 % Apply 1 application topically as needed (for itching- to affected sites).     . vitamin C (ASCORBIC ACID) 500 MG tablet Take 1,000 mg by mouth daily.     . Vitamin D, Ergocalciferol, (DRISDOL) 1.25 MG (50000 UT) CAPS capsule Take 50,000 Units by mouth every 30 (thirty) days.     . Zinc 50 MG CAPS Take 50 mg by mouth at bedtime.     . furosemide (LASIX) 40 MG tablet Take 1 tablet (40 mg total) by mouth as needed for fluid or edema. 90 tablet 3   No current facility-administered medications on file prior to visit.    Past Medical History:  Diagnosis Date  . Acute superficial venous thrombosis of lower extremity    a. RLE after CABG, neg dopp for DVT.  [name] Allergy   . Anemia   . Atrial fibrillation ([place])    a. Post-op from CABG, on amiodarone temporarily, d/c'd 12/2011  . CAD (coronary artery disease)    a. S/P stenting to mid RCA, prox PDA 06/1999. b. NSTEMI/CABG x 3 in 10/2011 with LIMA to LAD, SVG to PDA, and SVG to OM1.   . Chronic UTI    a. Followed by Dr. Risa Grill - colonized/asymptomatic - not on abx  . CKD (chronic kidney disease), stage III    a. stable with a creatinine around 1.9-2.0 followed by nephrology.  . Dyslipidemia   . Echocardiogram    Echocardiogram 08/2018: EF 50-55, inf-lat AK, mild LVH, Gr 1 DD, RVSP 47, mild LAE, mild to mod MR, mild AI, mild AS (mean 11)  . GERD (gastroesophageal reflux disease)   . Hypertension    well-controlled.  . Myocardial infarction ([place])   . Neck injury    a. C3-C4 and C4-C5 foraminal narrowing, severe  . Prostate cancer ([place])    a. 2001 s/p TURP.  . Pulmonary nodule    a.  felt to be noncancerous.  Status post followup CT scan 4 mm and stable.  . Renal artery stenosis ([place])    a. 50% by cath 2001  . 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 ANTERIOR CHAMBER WASHOUT Left 10/04/2018   Procedure: Anterior Chamber Washout, Vitreous Tap;  Surgeon: Jalene Mullet, MD;  Location: [place];  Service: Ophthalmology;  Laterality: Left;  . cardia catherization  07-07-99  . CARDIAC SURGERY  10/18/12   open heart surgery  . CATARACT EXTRACTION W/ INTRAOCULAR LENS  IMPLANT, BILATERAL  3/205, 06/2013   mccuen  . COLONOSCOPY  04/12/07  . CORONARY ARTERY BYPASS  GRAFT  10/19/2011   Procedure: CORONARY ARTERY BYPASS GRAFTING (CABG);  Surgeon: Gaye Pollack, MD;  Location: [place];  Service: Open Heart Surgery;  Laterality: N/A;  times three using Left Internal Mammary Artery and Right Greater Saphenouse Vein Graft harvested Endoscopically  . edg  07-17-1994  . FLEXIBLE SIGMOIDOSCOPY  11-03-1997  . GAS INSERTION Left 10/04/2018   Procedure: Insertion Of Gas;  Surgeon: Jalene Mullet, MD;  Location: [place];  Service: Ophthalmology;  Laterality: Left;  [name] GAS/FLUID EXCHANGE Left 10/04/2018   Procedure: Gas/Fluid Exchange;  Surgeon: Jalene Mullet, MD;  Location: [place];  Service: Ophthalmology;  Laterality: Left;  . LEFT HEART CATHETERIZATION WITH CORONARY ANGIOGRAM N/A 10/16/2011   Procedure: LEFT HEART CATHETERIZATION WITH CORONARY ANGIOGRAM;  Surgeon: Peter M Martinique, MD;  Location: [facility] CATH LAB;  Service: Cardiovascular;  Laterality: N/A;  . LEFT HEART CATHETERIZATION WITH CORONARY/GRAFT ANGIOGRAM N/A 03/11/2013   Procedure: LEFT HEART CATHETERIZATION WITH [name];  Surgeon: Blane Ohara, MD;  Location[facility] C[facility] LAB;  Service: Cardiovascular;  Laterality: N/A;  . lumbar spinal disk and neck fusion surgery    . PACEMAKER INSERTION  03/28/12   PPM implanted for mobitz II AV block    . PARS PLANA VITRECTOMY Left 10/04/2018   Procedure: PARS PLANA VITRECTOMY 25 GAUGE FOR ENDOPHTHALMITIS WITH INJECTION OF INTRAVITREAL ANTIBIOTIC;  Surgeon: Jalene Mullet, MD;  Location: [place];  Service: Ophthalmology;  Laterality: Left;  . peripheral vascular catherization  11-24-03  . PERMANENT PACEMAKER INSERTION N/A 03/28/2012   Procedure: PERMANENT PACEMAKER INSERTION;  Surgeon: Thompson Grayer, MD;  Location: [facility] CATH LAB;  Service: Cardiovascular;  Laterality: N/A;  . PROSTATECTOMY    . renal circulation  10-01-03  . s/p ptca    . stents     X 2  . stress cardiolite  05-04-05   spring 09-negative except for apical thinning, EF 68%    Social History   Socioeconomic History  . Marital status: Married    Spouse name: [name]  . Number of children: 2  . Years of education: Not on file  . Highest education level: Some college, no degree  Occupational History  . 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 Smoking status: Never Smoker  . Smokeless tobacco: Never Used  Vaping Use  . Vaping Use: Never used  Substance and Sexual Activity  . Alcohol use: Yes    Comment: Rarely  . Drug use: No  . Sexual activity: Not on file  Other Topics Concern  . Not on file  Social History Narrative   HSG, 1 year college.  married '52 - 3 years, divorced; married '56 - 3 years divorced; married '44-12 yrs - divorced; married '75 -. 1 son '57; 1 daughter - '53; 1 grandchild.  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  Very happily married.  End of life care: yes CPR, no long term mechanical ventilation, no heroic measures.right handed   Social Determinants of Health   Financial Resource Strain:   . Difficulty of Paying Living Expenses:   Food Insecurity:   . Worried About Charity fundraiser in the  Last Year:   . Arboriculturist in the Last Year:   Transportation Needs:   . Film/video editor (Medical):   [name]  Lack of Transportation (Non-Medical):   Physical Activity:   . Days of Exercise per Week:   . Minutes of Exercise per Session:   Stress:   . Feeling of Stress :   Social Connections:   . Frequency of Communication with Friends and Family:   . Frequency of Social Gatherings with Friends and Family:   . Attends Religious Services:   . Active Member of Clubs or Organizations:   . Attends Archivist Meetings:   [name] Marital Status:     Family History  Problem Relation Age of Onset  . Coronary artery disease Father        died @ 95  . Other Mother        cerebral hemorrhage - died @ 52  . Arthritis Mother   . Cancer Brother        Bladder  . Prostate cancer Brother   . Nephritis Brother        died @ age 31.  . Other Brother        cerebral hemorrhage - died @ 48  . Heart disease Brother   . Breast cancer Other        niece x 2  . Diabetes Neg Hx   . Colon cancer Neg Hx   . Adrenal disorder Neg Hx     Review of Systems  Constitutional: Negative for fever.  Gastrointestinal: Positive for abdominal pain and diarrhea (loser stools). Negative for blood in stool and nausea.  Genitourinary: Negative for difficulty urinating, dysuria and hematuria.       Objective:   Vitals:   07/01/19 1311  BP: 140/70  Pulse: 74  Temp: 98.2 F (36.8 C)  SpO2: 99%   BP Readings from Last 3 Encounters:  07/01/19 140/70  06/23/19 139/61  06/09/19 (!) 159/78   Wt Readings from Last 3 Encounters:  07/01/19 150 lb 12.8 oz (68.4 kg)  03/24/19 158 lb (71.7 kg)  03/18/19 155 lb (70.3 kg)   Body mass index is 21.03 kg/m.   Physical Exam    Constitutional: Appears well-developed and well-nourished. No distress.  HENT:  Head: Normocephalic and atraumatic.  Abdomen:  Soft, ND, no tenderness in left suprapubic region - there is a prominence there.  Minimal focal  tenderness in left lower quadrant w/o rebound or guarding Skin: Skin is warm and dry. Not diaphoretic.       Assessment & Plan:    See Problem List for Assessment and Plan of chronic medical problems.    This visit occurred during the SARS-CoV-2 public health emergency.  Safety protocols were in place, including screening questions prior to the visit, additional usage of staff PPE, and extensive cleaning of exam room while observing appropriate contact time as indicated for disinfecting solutions.

## 2019-07-01 ENCOUNTER — Ambulatory Visit (INDEPENDENT_AMBULATORY_CARE_PROVIDER_SITE_OTHER): Payer: Medicare Other | Admitting: Internal Medicine

## 2019-07-01 ENCOUNTER — Other Ambulatory Visit: Payer: Self-pay

## 2019-07-01 ENCOUNTER — Encounter: Payer: Self-pay | Admitting: Internal Medicine

## 2019-07-01 VITALS — BP 140/70 | HR 74 | Temp 98.2°F | Ht 71.0 in | Wt 150.8 lb

## 2019-07-01 DIAGNOSIS — I25119 Atherosclerotic heart disease of native coronary artery with unspecified angina pectoris: Secondary | ICD-10-CM

## 2019-07-01 DIAGNOSIS — K4090 Unilateral inguinal hernia, without obstruction or gangrene, not specified as recurrent: Secondary | ICD-10-CM | POA: Diagnosis not present

## 2019-07-01 HISTORY — DX: Unilateral inguinal hernia, without obstruction or gangrene, not specified as recurrent: K40.90

## 2019-07-01 NOTE — Assessment & Plan Note (Signed)
Acute Symptoms concerning for left inguinal hernia It has been causing discomfort Will refer to surgery for evaluation

## 2019-07-06 ENCOUNTER — Other Ambulatory Visit: Payer: Self-pay

## 2019-07-06 ENCOUNTER — Ambulatory Visit (INDEPENDENT_AMBULATORY_CARE_PROVIDER_SITE_OTHER): Payer: Medicare Other | Admitting: *Deleted

## 2019-07-06 ENCOUNTER — Encounter: Payer: Self-pay | Admitting: Cardiovascular Disease

## 2019-07-06 ENCOUNTER — Ambulatory Visit (INDEPENDENT_AMBULATORY_CARE_PROVIDER_SITE_OTHER): Payer: Medicare Other | Admitting: Cardiovascular Disease

## 2019-07-06 VITALS — BP 140/80 | HR 72 | Ht 71.0 in | Wt 151.0 lb

## 2019-07-06 DIAGNOSIS — I2510 Atherosclerotic heart disease of native coronary artery without angina pectoris: Secondary | ICD-10-CM

## 2019-07-06 DIAGNOSIS — I10 Essential (primary) hypertension: Secondary | ICD-10-CM

## 2019-07-06 DIAGNOSIS — R001 Bradycardia, unspecified: Secondary | ICD-10-CM

## 2019-07-06 DIAGNOSIS — I25119 Atherosclerotic heart disease of native coronary artery with unspecified angina pectoris: Secondary | ICD-10-CM | POA: Diagnosis not present

## 2019-07-06 DIAGNOSIS — N1832 Chronic kidney disease, stage 3b: Secondary | ICD-10-CM

## 2019-07-06 DIAGNOSIS — I351 Nonrheumatic aortic (valve) insufficiency: Secondary | ICD-10-CM

## 2019-07-06 DIAGNOSIS — E538 Deficiency of other specified B group vitamins: Secondary | ICD-10-CM | POA: Diagnosis not present

## 2019-07-06 MED ORDER — CYANOCOBALAMIN 1000 MCG/ML IJ SOLN
1000.0000 ug | Freq: Once | INTRAMUSCULAR | Status: AC
Start: 2019-07-06 — End: 2019-07-06
  Administered 2019-07-06: 1000 ug via INTRAMUSCULAR

## 2019-07-06 NOTE — Progress Notes (Signed)
Cardiology Office Note:    Date:  07/06/2019   ID:  BIANCA VESTER, DOB 26-Dec-1926, MRN [id number]  PCP:  [name], MD  [place] Cardiologist:  [name], MD  [place] Electrophysiologist:  None   Referring MD: [name], MD   Chief Complaint  Patient presents with  . Shortness of Breath    History of Present Illness:    [name] is a 86 y.o. male with a hx of coronary artery disease, chronic kidney disease, and mild aortic stenosis, presenting for follow-up evaluation.  The patient has a history of RCA stenting.  He ultimately was treated with multivessel CABG in 2013 with early graft failure of all of his venous conduits and continued patency of the mammary artery to LAD graft.  He had a nuclear stress test in 2019 demonstrating no significant ischemia.  Comorbid medical conditions include chronic diastolic heart failure, symptomatic bradycardia status post permanent pacemaker, chronic kidney disease stage 3, mixed hyperlipidemia, hypertension, prostate cancer, and chronic anemia.  He is going to have a surgical consultation regarding a left inguinal hernia. Symptoms include burning pain in the groin and lower abdomen. He has been followed by Dr Justin Mend and states HgB is improved with treatment of anemia. He continues to struggle with fatigue, sometimes tired with light activities such as making the bed. He has occasional left chest soreness but feels like this may be related to his pacemaker as he sleeps his left side. No orthopnea or PND. Occasional leg swelling.   Past Medical History:  Diagnosis Date  . Acute superficial venous thrombosis of lower extremity    a. RLE after CABG, neg dopp for DVT.  [name] Allergy   . Anemia   . Atrial fibrillation ([place])    a. Post-op from CABG, on amiodarone temporarily, d/c'd 12/2011  . CAD (coronary artery disease)    a. S/P stenting to mid RCA, prox PDA 06/1999. b. NSTEMI/CABG x 3 in 10/2011 with LIMA to LAD,  SVG to PDA, and SVG to OM1.   . Chronic UTI    a. Followed by Dr. Risa Grill - colonized/asymptomatic - not on abx  . CKD (chronic kidney disease), stage III    a. stable with a creatinine around 1.9-2.0 followed by nephrology.  . Dyslipidemia   . Echocardiogram    Echocardiogram 08/2018: EF 50-55, inf-lat AK, mild LVH, Gr 1 DD, RVSP 47, mild LAE, mild to mod MR, mild AI, mild AS (mean 11)  . GERD (gastroesophageal reflux disease)   . Hypertension    well-controlled.  . Myocardial infarction ([place])   . Neck injury    a. C3-C4 and C4-C5 foraminal narrowing, severe  . Prostate cancer ([place])    a. 2001 s/p TURP.  . Pulmonary nodule    a. felt to be noncancerous.  Status post followup CT scan 4 mm and stable.  . Renal artery stenosis ([place])    a. 50% by cath 2001  . 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 ANTERIOR CHAMBER WASHOUT Left 10/04/2018   Procedure: Anterior Chamber Washout, Vitreous Tap;  Surgeon: Jalene Mullet, MD;  Location: [place] [facility];  Service: Ophthalmology;  Laterality: Left;  . cardia catherization  07-07-99  . CARDIAC SURGERY  10/18/12   open heart surgery  . CATARACT EXTRACTION W/ INTRAOCULAR LENS  IMPLANT, BILATERAL  3/205, 06/2013   [place]  . COLONOSCOPY  04/12/07  . CORONARY ARTERY BYPASS GRAFT  10/19/2011   Procedure: CORONARY ARTERY BYPASS GRAFTING (CABG);  Surgeon: Gaye Pollack, MD;  Location: [place];  Service: Open Heart Surgery;  Laterality: N/A;  times three using Left Internal Mammary Artery and Right Greater [place]henouse Vein Graft harvested Endoscopically  . edg  07-17-1994  . FLEXIBLE SIGMOIDOSCOPY  11-03-1997  . GAS INSERTION Left 10/04/2018   Procedure: Insertion Of Gas;  Surgeon: Jalene Mullet, MD;  Location: [place];  Service: Ophthalmology;  Laterality: Left;  [name] GAS/FLUID EXCHANGE Left 10/04/2018   Procedure: Gas/Fluid Exchange;  Surgeon: Jalene Mullet, MD;  Location:  [place];  Service: Ophthalmology;  Laterality: Left;  . LEFT HEART CATHETERIZATION WITH CORONARY ANGIOGRAM N/A 10/16/2011   Procedure: LEFT HEART CATHETERIZATION WITH CORONARY ANGIOGRAM;  Surgeon: Peter M Martinique, MD;  Location: [facility] CATH LAB;  Service: Cardiovascular;  Laterality: N/A;  . LEFT HEART CATHETERIZATION WITH CORONARY/GRAFT ANGIOGRAM N/A 03/11/2013   Procedure: LEFT HEART CATHETERIZATION WITH [name];  Surgeon: Blane Ohara, MD;  Location: [facility] CATH LAB;  Service: Cardiovascular;  Laterality: N/A;  . lumbar spinal disk and neck fusion surgery    . PACEMAKER INSERTION  03/28/12   PPM implanted for mobitz II AV block  . PARS PLANA VITRECTOMY Left 10/04/2018   Procedure: PARS PLANA VITRECTOMY 25 GAUGE FOR ENDOPHTHALMITIS WITH INJECTION OF INTRAVITREAL ANTIBIOTIC;  Surgeon: Jalene Mullet, MD;  Location: [place];  Service: Ophthalmology;  Laterality: Left;  . peripheral vascular catherization  11-24-03  . PERMANENT PACEMAKER INSERTION N/A 03/28/2012   Procedure: PERMANENT PACEMAKER INSERTION;  Surgeon: Thompson Grayer, MD;  Location: [facility] CATH LAB;  Service: Cardiovascular;  Laterality: N/A;  . PROSTATECTOMY    . renal circulation  10-01-03  . s/p ptca    . stents     X 2  . stress cardiolite  05-04-05   spring 09-negative except for apical thinning, EF 68%    Current Medications: Current Meds  Medication Sig  . amLODipine (NORVASC) 5 MG tablet Take 1 tablet (5 mg total) by mouth daily.  . Aromatic Inhalants (VICKS VAPOR INHALER IN) Place 1 puff into both nostrils as needed (for congestion).  [name] aspirin EC 81 MG tablet Take 81 mg by mouth daily.  . Carboxymeth-Glycerin-Polysorb (REFRESH OPTIVE MEGA-3 OP) Place 1 drop into both eyes 2 (two) times daily.  . Cyanocobalamin (VITAMIN B-12 IJ) Inject 1 mL as directed every 30 (thirty) days.  [name] epoetin alfa-epbx (RETACRIT) 2000 UNIT/ML injection 2,000 Units every 14 (fourteen) days. Patient states he only gets it when he needs it but  not every 2 weeks  . fluticasone (FLONASE) 50 MCG/ACT nasal spray Place 2 sprays into both nostrils daily.  . folic acid (FOLVITE) 1 MG tablet Take 1 tablet (1 mg total) by mouth daily. Annual appt due in May must see provider for future refills  . loratadine (CLARITIN) 10 MG tablet Take 10 mg by mouth daily as needed (for seasonal allergies).  . Multiple Vitamins-Minerals (PRESERVISION AREDS 2 PO) Take 1 capsule by mouth 2 (two) times daily.   . nitroGLYCERIN (NITROSTAT) 0.4 MG SL tablet DISSOLVE 1 TABLET UNDER THE TONGUE EVERY 5 MINUTES AS NEEDED FOR CHEST PAIN, MAXIMUM 3 TABLETS  . triamcinolone cream (KENALOG) 0.1 % Apply 1 application topically as needed (for itching- to affected sites).   . vitamin C (ASCORBIC ACID) 500 MG tablet Take 1,000 mg by mouth daily.   . Vitamin D, Ergocalciferol, (DRISDOL) 1.25 MG (50000 UT) CAPS capsule Take  50,000 Units by mouth every 30 (thirty) days.   . Zinc 50 MG CAPS Take 50 mg by mouth at bedtime.      Allergies:   Aspirin, Lisinopril, Amoxicillin, Atarax [hydroxyzine hcl], Cephalexin, Ciprofloxacin, Clindamycin, Clobetasol, Codeine, Fish allergy, Fluarix [flu virus vaccine], Haemophilus influenzae, Hydrocodone, Hydrocodone-acetaminophen, Hydroxyzine, Latex, Macrobid [nitrofurantoin macrocrystal], Niacin, Niacin-lovastatin er, Niacin-lovastatin er, Nitrofurantoin, Omeprazole, Vibramycin [doxycycline], Adhesive [tape], Bactrim [sulfamethoxazole-trimethoprim], Colchicine, and Gabapentin   Social History   Socioeconomic History  . Marital status: Married    Spouse name: [name]  . Number of children: 2  . Years of education: Not on file  . Highest education level: Some college, no degree  Occupational History  . 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 Smoking status: Never Smoker  . Smokeless  tobacco: Never Used  Vaping Use  . Vaping Use: Never used  Substance and Sexual Activity  . Alcohol use: Yes    Comment: Rarely  . Drug use: No  . Sexual activity: Not on file  Other Topics Concern  . Not on file  Social History Narrative   HSG, 1 year college.  married '52 - 3 years, divorced; married '56 - 3 years divorced; married '50-12 yrs - divorced; married '75 -. 1 son '57; 1 daughter - '53; 1 grandchild.  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  Very happily married.  End of life care: yes CPR, no long term mechanical ventilation, no heroic measures.right handed   Social Determinants of Health   Financial Resource Strain:   . Difficulty of Paying Living Expenses:   Food Insecurity:   . Worried About Charity fundraiser in the Last Year:   . Arboriculturist in the Last Year:   Transportation Needs:   . Film/video editor (Medical):   [name] Lack of Transportation (Non-Medical):   Physical Activity:   . Days of Exercise per Week:   . Minutes of Exercise per Session:   Stress:   . Feeling of Stress :   Social Connections:   . Frequency of Communication with Friends and Family:   . Frequency of Social Gatherings with Friends and Family:   . Attends Religious Services:   . Active Member of Clubs or Organizations:   . Attends Archivist Meetings:   [name] Marital Status:      Family History: The patient's family history includes Arthritis in his mother; Breast cancer in an other family member; Cancer in his brother; Coronary artery disease in his father; Heart disease in his brother; Nephritis in his brother; Other in his brother and mother; Prostate cancer in his brother. There is no history of Diabetes, Colon cancer, or Adrenal disorder.  ROS:   Please see the history of present illness.    All other systems reviewed and are negative.  EKGs/Labs/Other Studies Reviewed:    The following studies were reviewed  today: Echo 08/11/2018: 1. The left ventricle has low normal systolic function, with an ejection  fraction of 50-55%. The cavity size was normal. There is mildly increased  left ventricular wall thickness. Left ventricular diastolic Doppler  parameters are consistent with  impaired relaxation.  2. The right ventricle has normal systolic function. The cavity was  normal. Right ventricular systolic pressure is mildly elevated  with an  estimated pressure of 47.0 mmHg.  3. Left atrial size was mildly dilated.  4. The mitral valve is abnormal. There is mild mitral annular  calcification present. Mitral valve regurgitation is mild to moderate by  color flow Doppler.  5. The aortic valve is tricuspid. Moderate calcification of the aortic  valve. Aortic valve regurgitation is mild by color flow Doppler. Mild  stenosis of the aortic valve.  6. The aorta is normal in size and structure.  7. Akinesis of the basal inferolateral wall with overall preserved LV  systolic function; mild diastolic dysfunction; mild LVH; calcified aortic  valve with mild AS (mean gradient 11 mmHg) and mild AI; mild to moderate  MR; mild LAE.   EKG:  EKG is ordered today.  The ekg ordered today demonstrates NSR 72 bpm, 1st degree AV block, LBBB. Compared to last tracing 03/17/2018, the QRS has lengthened from 106 ms--->130 ms  Recent Labs: 04/28/2019: BUN 25; Creatinine, Ser 1.62; Potassium 4.7; Sodium 138 06/23/2019: Hemoglobin 13.1  Recent Lipid Panel    Component Value Date/Time   CHOL 122 09/04/2016 1123   TRIG 59.0 09/04/2016 1123   TRIG 107 01/16/2006 1338   HDL 40.40 09/04/2016 1123   CHOLHDL 3 09/04/2016 1123   VLDL 11.8 09/04/2016 1123   LDLCALC 70 09/04/2016 1123    Physical Exam:    VS:  BP 140/80   Pulse 72   Ht 5\' 11"  (1.803 m)   Wt 151 lb (68.5 kg)   SpO2 97%   BMI 21.06 kg/m     Wt Readings from Last 3 Encounters:  07/06/19 151 lb (68.5 kg)  07/01/19 150 lb 12.8 oz (68.4 kg)  03/24/19  158 lb (71.7 kg)     GEN:  Well nourished, well developed in no acute distress HEENT: Normal NECK: No JVD; No carotid bruits LYMPHATICS: No lymphadenopathy CARDIAC: RRR, 3/6 mid peaking crescendo decrescendo murmur best heard at the left lower sternal border and apex, A2 present but diminished RESPIRATORY:  Clear to auscultation without rales, wheezing or rhonchi  ABDOMEN: Soft, non-tender, non-distended MUSCULOSKELETAL:  No edema; No deformity  SKIN: Warm and dry NEUROLOGIC:  Alert and oriented x 3 PSYCHIATRIC:  Normal affect   ASSESSMENT:    1. Symptomatic bradycardia   2. Stage 3b chronic kidney disease   3. Coronary artery disease involving native coronary artery of native heart without angina pectoris   4. Essential hypertension   5. Moderate aortic regurgitation    PLAN:    In order of problems listed above:  1. Status post permanent pacemaker, stable 2. Followed closely by nephrology.  Most recent labs reviewed with stable creatinine 3. No clear anginal symptoms.  Chronic dyspnea noted.  Medical program reviewed and appropriate. 4. He brings in home blood pressure readings today.  These are improved compared to previous readings and in fact I think he is doing much better on amlodipine 5 mg and now off of a beta-blocker.  Most readings are in the 120s and 130s with the highest readings obtained at 162 mmHg.  Diastolic blood pressures are predominantly in the 50s and 60s.  No changes are made. 5. The patient has mixed aortic stenosis and regurgitation.  I do not appreciate a diastolic murmur on his exam, but I wonder if his aortic stenosis has progressed based on murmur characteristics.  Recommend a repeat echo.  Overall the patient appears to be stable from a cardiovascular perspective.  It sounds like he will likely  require left inguinal hernia surgery which would be a low risk surgery.  I would like to check his echocardiogram prior to surgery.  I would anticipate clearing  him for surgery unless there is marked progression of aortic stenosis.   Medication Adjustments/Labs and Tests Ordered: Current medicines are reviewed at length with the patient today.  Concerns regarding medicines are outlined above.  Orders Placed This Encounter  Procedures  . EKG 12-Lead  . ECHOCARDIOGRAM COMPLETE   No orders of the defined types were placed in this encounter.   Patient Instructions  Medication Instructions:  Your provider recommends that you continue on your current medications as directed. Please refer to the Current Medication list given to you today.   *If you need a refill on your cardiac medications before your next appointment, please call your pharmacy*  Testing/Procedures: Your physician has requested that you have an echocardiogram. Echocardiography is a painless test that uses sound waves to create images of your heart. It provides your doctor with information about the size and shape of your heart and how well your heart's chambers and valves are working. This procedure takes approximately one hour. There are no restrictions for this procedure.    Follow-Up: At [facility], you and your health needs are our priority.  As part of our continuing mission to provide you with exceptional heart care, we have created designated Provider Care Teams.  These Care Teams include your primary Cardiologist (physician) and Advanced Practice Providers (APPs -  Physician Assistants and Nurse Practitioners) who all work together to provide you with the care you need, when you need it. Your next appointment:   6 month(s) The format for your next appointment:   In Person Provider:   You may see [name], MD or one of the following Advanced Practice Providers on your designated Care Team:    Richardson Dopp, PA-C  Robbie Lis, [place]      Signed, [name], MD  07/06/2019 4:47 PM    [place]

## 2019-07-06 NOTE — Progress Notes (Signed)
Pls cosign for B12 inj../lmb  

## 2019-07-06 NOTE — Patient Instructions (Signed)
Medication Instructions:  Your provider recommends that you continue on your current medications as directed. Please refer to the Current Medication list given to you today.   *If you need a refill on your cardiac medications before your next appointment, please call your pharmacy*  Testing/Procedures: Your physician has requested that you have an echocardiogram. Echocardiography is a painless test that uses sound waves to create images of your heart. It provides your doctor with information about the size and shape of your heart and how well your heart's chambers and valves are working. This procedure takes approximately one hour. There are no restrictions for this procedure.    Follow-Up: At [facility], you and your health needs are our priority.  As part of our continuing mission to provide you with exceptional heart care, we have created designated Provider Care Teams.  These Care Teams include your primary Cardiologist (physician) and Advanced Practice Providers (APPs -  Physician Assistants and Nurse Practitioners) who all work together to provide you with the care you need, when you need it. Your next appointment:   6 month(s) The format for your next appointment:   In Person Provider:   You may see [name], MD or one of the following Advanced Practice Providers on your designated Care Team:    Richardson Dopp, PA-C  [place], [place]

## 2019-07-07 ENCOUNTER — Ambulatory Visit (HOSPITAL_COMMUNITY): Payer: Medicare Other | Attending: Cardiovascular Disease

## 2019-07-07 ENCOUNTER — Encounter (HOSPITAL_COMMUNITY): Payer: Medicare Other

## 2019-07-07 DIAGNOSIS — I351 Nonrheumatic aortic (valve) insufficiency: Secondary | ICD-10-CM | POA: Insufficient documentation

## 2019-07-08 ENCOUNTER — Encounter (HOSPITAL_COMMUNITY)
Admission: RE | Admit: 2019-07-08 | Discharge: 2019-07-08 | Disposition: A | Discharge status: Home / Self Care | Payer: Medicare Other | Source: Ambulatory Visit | Attending: Nephrology | Admitting: Nephrology

## 2019-07-08 ENCOUNTER — Telehealth: Payer: Self-pay

## 2019-07-08 ENCOUNTER — Other Ambulatory Visit: Payer: Self-pay

## 2019-07-08 DIAGNOSIS — N183 Chronic kidney disease, stage 3 unspecified: Secondary | ICD-10-CM | POA: Diagnosis not present

## 2019-07-08 LAB — POCT HEMOGLOBIN-HEMACUE: Hemoglobin: 12.6 g/dL — ABNORMAL LOW (ref 13.0–17.0)

## 2019-07-08 NOTE — Telephone Encounter (Signed)
New message    The patient is asking for the CMA to call him back to discuss his referral to [facility].   The first appointment is  7.13.2021 for consultation.   The patient asking for options and a call back from the office.

## 2019-07-09 ENCOUNTER — Encounter: Payer: Self-pay | Admitting: Internal Medicine

## 2019-07-09 NOTE — Telephone Encounter (Signed)
Advised pt to call [facility] via mychart to see if they could get him in sooner

## 2019-07-09 NOTE — Telephone Encounter (Signed)
Not in [facility] could try [facility] but not sure how far they are scheduling out

## 2019-07-09 NOTE — Telephone Encounter (Signed)
Left message for patient to return call to clinic.

## 2019-07-09 NOTE — Telephone Encounter (Signed)
Are there any other surgery groups we could refer him to?

## 2019-07-10 ENCOUNTER — Telehealth: Payer: Self-pay | Admitting: Internal Medicine

## 2019-07-10 DIAGNOSIS — K4090 Unilateral inguinal hernia, without obstruction or gangrene, not specified as recurrent: Secondary | ICD-10-CM

## 2019-07-10 NOTE — Telephone Encounter (Signed)
Patient returned call to clinic. I spoke with him today and info given about location in [facility]. He will call back if a referral is needed.

## 2019-07-10 NOTE — Telephone Encounter (Signed)
New Message:   Pt is calling back and states they do require a referral to be sent and need it faxed to [phone]. Please put attn to [facility]. Please advise.

## 2019-07-14 ENCOUNTER — Ambulatory Visit (INDEPENDENT_AMBULATORY_CARE_PROVIDER_SITE_OTHER): Payer: Medicare Other | Admitting: *Deleted

## 2019-07-14 DIAGNOSIS — I442 Atrioventricular block, complete: Secondary | ICD-10-CM

## 2019-07-14 LAB — CUP PACEART REMOTE DEVICE CHECK
Battery Impedance: 577 Ohm
Battery Remaining Longevity: 88 mo
Battery Voltage: 2.79 V
Brady Statistic AP VP Percent: 0 %
Brady Statistic AP VS Percent: 36 %
Brady Statistic AS VP Percent: 0 %
Brady Statistic AS VS Percent: 63 %
Date Time Interrogation Session: 20210706075902
Implantable Lead Implant Date: 20140321
Implantable Lead Implant Date: 20140321
Implantable Lead Location: 753859
Implantable Lead Location: 753860
Implantable Lead Model: 5076
Implantable Lead Model: 5092
Implantable Pulse Generator Implant Date: 20140321
Lead Channel Impedance Value: 422 Ohm
Lead Channel Impedance Value: 716 Ohm
Lead Channel Pacing Threshold Amplitude: 0.75 V
Lead Channel Pacing Threshold Amplitude: 0.875 V
Lead Channel Pacing Threshold Pulse Width: 0.4 ms
Lead Channel Pacing Threshold Pulse Width: 0.4 ms
Lead Channel Setting Pacing Amplitude: 2 V
Lead Channel Setting Pacing Amplitude: 2.5 V
Lead Channel Setting Pacing Pulse Width: 0.4 ms
Lead Channel Setting Sensing Sensitivity: 5.6 mV

## 2019-07-14 NOTE — Telephone Encounter (Signed)
Referral has been sent to [facility] and pt is aware

## 2019-07-16 NOTE — Progress Notes (Signed)
Remote pacemaker transmission.   

## 2019-07-20 ENCOUNTER — Telehealth: Payer: Self-pay

## 2019-07-20 NOTE — Telephone Encounter (Signed)
New message    Hernia operation is schedule for Friday, July 16,2021 time is still pending

## 2019-07-20 NOTE — Progress Notes (Signed)
Subjective:    Patient ID: [name], male    DOB: 04/09/1926, 86 y.o.   MRN: [id number]  HPI The patient is here for an acute visit.   Left big toe pain:  Sunday morning his toe pain woke him up at 3:30 am.  The pain was severe.  He was not able to put any pressure on it.  Pain initially from first two toes to anterior ankle.  He has pain now on bottom of the foot on occasion.  The area is warm and warm.  He took tylenol, and it did help.  He has had gout in the past and is concerned that is what he has.  He denies injury.    He was on Allopurinol  Last year and not sure why he stopped it.   Medications and allergies reviewed with patient and updated if appropriate.  Patient Active Problem List   Diagnosis Date Noted  . Left inguinal hernia 07/01/2019  . Weight loss 06/18/2018  . Myositis 06/18/2018  . Gout involving toe of left foot 05/12/2018  . Elevated CK 05/12/2018  . Myoclonic jerking 02/10/2018  . Chronic diastolic CHF (congestive heart failure) ([place]) 02/05/2018  . UGIB (upper gastrointestinal bleed) 02/01/2018  . Lumbar post-laminectomy syndrome 12/12/2017  . Pain of left hip joint 11/29/2017  . Trochanteric bursitis of left hip 11/29/2017  . Hyperpigmentation 11/04/2017  . Spinal stenosis of lumbar region 10/09/2017  . Abnormal appearance of skin 10/07/2017  . Frequent urination 09/03/2017  . Gouty arthritis of right great toe 09/03/2017  . Postnasal drip 08/06/2017  . Rash and nonspecific skin eruption 08/06/2017  . Moderate aortic regurgitation 07/04/2017  . Diastolic dysfunction 73/53/2992  . Edema 06/29/2017  . DOE (dyspnea on exertion) 06/29/2017  . Sinus symptom 06/05/2017  . Fatigue 05/18/2017  . Sleeping difficulty 05/18/2017  . Foot pain, bilateral 04/22/2017  . Trochanteric bursitis 01/10/2017  . Internal hemorrhoids 08/15/2016  . Dysphagia 02/06/2016  . Cephalalgia 07/14/2015  . Constipation 03/01/2015  . Myalgia 03/02/2014  . CHB  (complete heart block) ([place]) 05/03/2012  . Postoperative atrial fibrillation ([place]) 05/03/2012  . Pacemaker 04/10/2012  . 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 Coronary atherosclerosis 11/27/2011  . Presence of aortocoronary bypass graft 10/24/2011  . Pulmonary nodule   . CAD (coronary artery disease)   . CKD (chronic kidney disease) stage 3, GFR 30-59 ml/min (HCC)   . Venous insufficiency of leg 06/05/2010  . SHOULDER PAIN, RIGHT, CHRONIC 10/14/2009  . B12 deficiency 08/23/2009  . Peripheral neuropathy 03/21/2009  . DEGENERATIVE DISC DISEASE, CERVICAL SPINE, W/RADICULOPATHY 09/21/2008  . Dyslipidemia 01/17/2007  . Essential hypertension 07/20/2006  . Prostate cancer ([place]) 07/20/2006    Current Outpatient Medications on File Prior to Visit  Medication Sig Dispense Refill  . amLODipine (NORVASC) 5 MG tablet Take 1 tablet (5 mg total) by mouth daily. 90 tablet 3  . Aromatic Inhalants (VICKS VAPOR INHALER IN) Place 1 puff into both nostrils as needed (for congestion).    [name] aspirin EC 81 MG tablet Take 81 mg by mouth daily.    . Carboxymeth-Glycerin-Polysorb (REFRESH OPTIVE MEGA-3 OP) Place 1 drop into both eyes 2 (two) times daily.    . Cyanocobalamin (VITAMIN B-12 IJ) Inject 1 mL as directed every 30 (thirty) days.    [name] epoetin alfa-epbx (RETACRIT) 2000 UNIT/ML injection 2,000 Units every 14 (fourteen) days. Patient states he only gets it when he needs it but not every  2 weeks    . fluticasone (FLONASE) 50 MCG/ACT nasal spray Place 2 sprays into both nostrils daily. 16 g 6  . folic acid (FOLVITE) 1 MG tablet Take 1 tablet (1 mg total) by mouth daily. Annual appt due in May must see provider for future refills 90 tablet 0  . loratadine (CLARITIN) 10 MG tablet Take 10 mg by mouth daily as needed (for seasonal allergies).    . Multiple Vitamins-Minerals (PRESERVISION AREDS 2 PO) Take 1 capsule by mouth 2 (two) times daily.     . nitroGLYCERIN (NITROSTAT) 0.4 MG SL  tablet DISSOLVE 1 TABLET UNDER THE TONGUE EVERY 5 MINUTES AS NEEDED FOR CHEST PAIN, MAXIMUM 3 TABLETS 100 tablet 1  . triamcinolone cream (KENALOG) 0.1 % Apply 1 application topically as needed (for itching- to affected sites).     . vitamin C (ASCORBIC ACID) 500 MG tablet Take 1,000 mg by mouth daily.     . Vitamin D, Ergocalciferol, (DRISDOL) 1.25 MG (50000 UT) CAPS capsule Take 50,000 Units by mouth every 30 (thirty) days.     . Zinc 50 MG CAPS Take 50 mg by mouth at bedtime.      No current facility-administered medications on file prior to visit.    Past Medical History:  Diagnosis Date  . Acute superficial venous thrombosis of lower extremity    a. RLE after CABG, neg dopp for DVT.  [name] Allergy   . Anemia   . Atrial fibrillation ([place])    a. Post-op from CABG, on amiodarone temporarily, d/c'd 12/2011  . CAD (coronary artery disease)    a. S/P stenting to mid RCA, prox PDA 06/1999. b. NSTEMI/CABG x 3 in 10/2011 with LIMA to LAD, SVG to PDA, and SVG to OM1.   . Chronic UTI    a. Followed by Dr. Risa Grill - colonized/asymptomatic - not on abx  . CKD (chronic kidney disease), stage III    a. stable with a creatinine around 1.9-2.0 followed by nephrology.  . Dyslipidemia   . Echocardiogram    Echocardiogram 08/2018: EF 50-55, inf-lat AK, mild LVH, Gr 1 DD, RVSP 47, mild LAE, mild to mod MR, mild AI, mild AS (mean 11)  . GERD (gastroesophageal reflux disease)   . Hypertension    well-controlled.  . Myocardial infarction ([place])   . Neck injury    a. C3-C4 and C4-C5 foraminal narrowing, severe  . Prostate cancer ([place])    a. 2001 s/p TURP.  . Pulmonary nodule    a. felt to be noncancerous.  Status post followup CT scan 4 mm and stable.  . Renal artery stenosis ([place])    a. 50% by cath 2001  . 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 ANTERIOR CHAMBER WASHOUT Left 10/04/2018   Procedure: Anterior  Chamber Washout, Vitreous Tap;  Surgeon: Jalene Mullet, MD;  Location: [facility] [facility];  Service: Ophthalmology;  Laterality: Left;  . cardia catherization  07-07-99  . CARDIAC SURGERY  10/18/12   open heart surgery  . CATARACT EXTRACTION W/ INTRAOCULAR LENS  IMPLANT, BILATERAL  3/205, 06/2013   [facility]  . COLONOSCOPY  04/12/07  . CORONARY ARTERY BYPASS GRAFT  10/19/2011   Procedure: CORONARY ARTERY BYPASS GRAFTING (CABG);  Surgeon: Gaye Pollack, MD;  Location: [facility];  Service: Open Heart Surgery;  Laterality: N/A;  times three using Left Internal Mammary Artery and Right Greater [place]hen[place] Vein Graft  harvested Endoscopically  . edg  07-17-1994  . FLEXIBLE SIGMOIDOSCOPY  11-03-1997  . GAS INSERTION Left 10/04/2018   Procedure: Insertion Of Gas;  Surgeon: Jalene Mullet, MD;  Location: [facility];  Service: Ophthalmology;  Laterality: Left;  [name] GAS/FLUID EXCHANGE Left 10/04/2018   Procedure: Gas/Fluid Exchange;  Surgeon: Jalene Mullet, MD;  Location: [facility];  Service: Ophthalmology;  Laterality: Left;  . LEFT HEART CATHETERIZATION WITH CORONARY ANGIOGRAM N/A 10/16/2011   Procedure: LEFT HEART CATHETERIZATION WITH CORONARY ANGIOGRAM;  Surgeon: Peter M Martinique, MD;  Location: [facility] C[facility] LAB;  Service: Cardiovascular;  Laterality: N/A;  . LEFT HEART CATHETERIZATION WITH CORONARY/GRAFT ANGIOGRAM N/A 03/11/2013   Procedure: LEFT HEART CATHETERIZATION WITH [name];  Surgeon: Blane Ohara, MD;  Location: [facility] CATH LAB;  Service: Cardiovascular;  Laterality: N/A;  . lumbar spinal disk and neck fusion surgery    . PACEMAKER INSERTION  03/28/12   PPM implanted for mobitz II AV block  . PARS PLANA VITRECTOMY Left 10/04/2018   Procedure: PARS PLANA VITRECTOMY 25 GAUGE FOR ENDOPHTHALMITIS WITH INJECTION OF INTRAVITREAL ANTIBIOTIC;  Surgeon: Jalene Mullet, MD;  Location: [place];  Service: Ophthalmology;  Laterality: Left;  . peripheral vascular catherization  11-24-03  . PERMANENT PACEMAKER INSERTION N/A 03/28/2012    Procedure: PERMANENT PACEMAKER INSERTION;  Surgeon: Thompson Grayer, MD;  Location: [facility] CATH LAB;  Service: Cardiovascular;  Laterality: N/A;  . PROSTATECTOMY    . renal circulation  10-01-03  . s/p ptca    . stents     X 2  . stress cardiolite  05-04-05   spring 09-negative except for apical thinning, EF 68%    Social History   Socioeconomic History  . Marital status: Married    Spouse name: [name]  . Number of children: 2  . Years of education: Not on file  . Highest education level: Some college, no degree  Occupational History  . 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 Smoking status: Never Smoker  . Smokeless tobacco: Never Used  Vaping Use  . Vaping Use: Never used  Substance and Sexual Activity  . Alcohol use: Yes    Comment: Rarely  . Drug use: No  . Sexual activity: Not on file  Other Topics Concern  . Not on file  Social History Narrative   HSG, 1 year college.  married '52 - 3 years, divorced; married '56 - 3 years divorced; married '15-12 yrs - divorced; married '75 -. 1 son '57; 1 daughter - '53; 1 grandchild.  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  Very happily married.  End of life care: yes CPR, no long term mechanical ventilation, no heroic measures.right handed   Social Determinants of Health   Financial Resource Strain:   . Difficulty of Paying Living Expenses:   Food Insecurity:   . Worried About Charity fundraiser in the Last Year:   . Arboriculturist in the Last Year:   Transportation Needs:   . Film/video editor (Medical):   [name] Lack of Transportation (Non-Medical):   Physical Activity:   . Days of Exercise per Week:   . Minutes of Exercise per Session:   Stress:   . Feeling of Stress :   Social Connections:   . Frequency of  Communication  with Friends and Family:   . Frequency of Social Gatherings with Friends and Family:   . Attends Religious Services:   . Active Member of Clubs or Organizations:   . Attends Archivist Meetings:   [name] Marital Status:     Family History  Problem Relation Age of Onset  . Coronary artery disease Father        died @ 30  . Other Mother        cerebral hemorrhage - died @ 63  . Arthritis Mother   . Cancer Brother        Bladder  . Prostate cancer Brother   . Nephritis Brother        died @ age 1.  . Other Brother        cerebral hemorrhage - died @ 44  . Heart disease Brother   . Breast cancer Other        niece x 2  . Diabetes Neg Hx   . Colon cancer Neg Hx   . Adrenal disorder Neg Hx     Review of Systems  Constitutional: Negative for chills and fever.  Musculoskeletal: Positive for arthralgias and joint swelling.  Skin: Positive for color change. Negative for rash and wound.  Neurological: Negative for numbness.       Objective:   Vitals:   07/21/19 1445  BP: (!) 144/78  Pulse: 82  Temp: 98 F (36.7 C)  SpO2: 99%   BP Readings from Last 3 Encounters:  07/21/19 (!) 144/78  07/08/19 (!) 156/77  07/06/19 140/80   Wt Readings from Last 3 Encounters:  07/21/19 155 lb (70.3 kg)  07/06/19 151 lb (68.5 kg)  07/01/19 150 lb 12.8 oz (68.4 kg)   Body mass index is 21.62 kg/m.   Physical Exam Constitutional:      General: He is not in acute distress.    Appearance: Normal appearance. He is not ill-appearing.  Musculoskeletal:     Comments: Left medial anterior foot with mild erythema and warmth - tenderness with palpation, dec ROM of first two toes due to pain, no pain plantar aspect,  FROM ankle  Skin:    General: Skin is warm and dry.  Neurological:     Mental Status: He is alert.            Assessment & Plan:    See Problem List for Assessment and Plan of chronic medical problems.    This visit occurred during the SARS-CoV-2 public  health emergency.  Safety protocols were in place, including screening questions prior to the visit, additional usage of staff PPE, and extensive cleaning of exam room while observing appropriate contact time as indicated for disinfecting solutions.

## 2019-07-20 NOTE — Telephone Encounter (Signed)
Patient has appointment tomorrow

## 2019-07-21 ENCOUNTER — Encounter: Payer: Self-pay | Admitting: Internal Medicine

## 2019-07-21 ENCOUNTER — Ambulatory Visit (INDEPENDENT_AMBULATORY_CARE_PROVIDER_SITE_OTHER): Payer: Medicare Other | Admitting: Internal Medicine

## 2019-07-21 ENCOUNTER — Other Ambulatory Visit: Payer: Self-pay

## 2019-07-21 VITALS — BP 144/78 | HR 82 | Temp 98.0°F | Ht 71.0 in | Wt 155.0 lb

## 2019-07-21 DIAGNOSIS — M10372 Gout due to renal impairment, left ankle and foot: Secondary | ICD-10-CM | POA: Diagnosis not present

## 2019-07-21 DIAGNOSIS — I25119 Atherosclerotic heart disease of native coronary artery with unspecified angina pectoris: Secondary | ICD-10-CM

## 2019-07-21 MED ORDER — ALLOPURINOL 100 MG PO TABS
100.0000 mg | ORAL_TABLET | Freq: Every day | ORAL | 1 refills | Status: DC
Start: 2019-07-21 — End: 2020-01-05

## 2019-07-21 MED ORDER — FUROSEMIDE 40 MG PO TABS: 40.0000 mg | ORAL_TABLET | ORAL | 3 refills | Status: DC | PRN

## 2019-07-21 MED ORDER — PREDNISONE 10 MG PO TABS: ORAL_TABLET | ORAL | 0 refills | Status: DC

## 2019-07-21 NOTE — Assessment & Plan Note (Signed)
Acute involving left first toe MCP and medial foot Prednisone taper Restart allopurinol 100 mg daily for prevention Call if no improvement

## 2019-07-21 NOTE — Patient Instructions (Signed)
Start a prednisone taper for the acute gout.  Start allopurinol 100 mg for prevention of gout.    The lasix was sent to your [facility].

## 2019-07-22 ENCOUNTER — Ambulatory Visit (HOSPITAL_COMMUNITY)
Admission: RE | Admit: 2019-07-22 | Discharge: 2019-07-22 | Disposition: A | Discharge status: Home / Self Care | Payer: Medicare Other | Source: Ambulatory Visit | Attending: Nephrology | Admitting: Nephrology

## 2019-07-22 VITALS — BP 144/71 | HR 86 | Resp 18

## 2019-07-22 DIAGNOSIS — N183 Chronic kidney disease, stage 3 unspecified: Secondary | ICD-10-CM | POA: Diagnosis present

## 2019-07-22 LAB — IRON AND TIBC
Iron: 68 ug/dL (ref 45–182)
Saturation Ratios: 26 % (ref 17.9–39.5)
TIBC: 259 ug/dL (ref 250–450)
UIBC: 191 ug/dL

## 2019-07-22 LAB — POCT HEMOGLOBIN-HEMACUE: Hemoglobin: 13.2 g/dL (ref 13.0–17.0)

## 2019-07-22 LAB — FERRITIN: Ferritin: 219 ng/mL (ref 24–336)

## 2019-07-22 MED ORDER — EPOETIN ALFA-EPBX 10000 UNIT/ML IJ SOLN: 20000.0000 [IU] | INTRAMUSCULAR | Status: DC

## 2019-08-05 ENCOUNTER — Other Ambulatory Visit: Payer: Self-pay

## 2019-08-05 ENCOUNTER — Ambulatory Visit (INDEPENDENT_AMBULATORY_CARE_PROVIDER_SITE_OTHER): Payer: Medicare Other | Admitting: *Deleted

## 2019-08-05 ENCOUNTER — Encounter (HOSPITAL_COMMUNITY)
Admission: RE | Admit: 2019-08-05 | Discharge: 2019-08-05 | Disposition: A | Discharge status: Home / Self Care | Payer: Medicare Other | Source: Ambulatory Visit | Attending: Nephrology | Admitting: Nephrology

## 2019-08-05 VITALS — BP 166/69 | HR 45

## 2019-08-05 DIAGNOSIS — D631 Anemia in chronic kidney disease: Secondary | ICD-10-CM | POA: Diagnosis not present

## 2019-08-05 DIAGNOSIS — E538 Deficiency of other specified B group vitamins: Secondary | ICD-10-CM

## 2019-08-05 DIAGNOSIS — N183 Chronic kidney disease, stage 3 unspecified: Secondary | ICD-10-CM | POA: Insufficient documentation

## 2019-08-05 LAB — POCT HEMOGLOBIN-HEMACUE: Hemoglobin: 11.7 g/dL — ABNORMAL LOW (ref 13.0–17.0)

## 2019-08-05 MED ORDER — EPOETIN ALFA-EPBX 10000 UNIT/ML IJ SOLN
INTRAMUSCULAR | Status: AC
  Administered 2019-08-05: 20000 [IU] via SUBCUTANEOUS
  Filled 2019-08-05: qty 2

## 2019-08-05 MED ORDER — EPOETIN ALFA-EPBX 10000 UNIT/ML IJ SOLN: 20000.0000 [IU] | INTRAMUSCULAR | Status: DC

## 2019-08-05 MED ORDER — CYANOCOBALAMIN 1000 MCG/ML IJ SOLN
1000.0000 ug | Freq: Once | INTRAMUSCULAR | Status: AC
  Administered 2019-08-05: 1000 ug via INTRAMUSCULAR

## 2019-08-05 NOTE — Progress Notes (Signed)
Pls cosign for B12 inj../lmb  

## 2019-08-13 DIAGNOSIS — Z9889 Other specified postprocedural states: Secondary | ICD-10-CM | POA: Insufficient documentation

## 2019-08-13 DIAGNOSIS — Z8719 Personal history of other diseases of the digestive system: Secondary | ICD-10-CM | POA: Insufficient documentation

## 2019-08-13 HISTORY — DX: Personal history of other diseases of the digestive system: Z87.19

## 2019-08-19 ENCOUNTER — Encounter (HOSPITAL_COMMUNITY)
Admission: RE | Admit: 2019-08-19 | Discharge: 2019-08-19 | Disposition: A | Discharge status: Home / Self Care | Payer: Medicare Other | Source: Ambulatory Visit | Attending: Nephrology | Admitting: Nephrology

## 2019-08-19 ENCOUNTER — Other Ambulatory Visit: Payer: Self-pay

## 2019-08-19 VITALS — BP 148/90 | HR 74 | Temp 97.8°F | Resp 20

## 2019-08-19 DIAGNOSIS — N183 Chronic kidney disease, stage 3 unspecified: Secondary | ICD-10-CM | POA: Insufficient documentation

## 2019-08-19 DIAGNOSIS — D631 Anemia in chronic kidney disease: Secondary | ICD-10-CM | POA: Insufficient documentation

## 2019-08-19 LAB — IRON AND TIBC
Iron: 66 ug/dL (ref 45–182)
Saturation Ratios: 25 % (ref 17.9–39.5)
TIBC: 260 ug/dL (ref 250–450)
UIBC: 194 ug/dL

## 2019-08-19 LAB — POCT HEMOGLOBIN-HEMACUE: Hemoglobin: 11.9 g/dL — ABNORMAL LOW (ref 13.0–17.0)

## 2019-08-19 LAB — FERRITIN: Ferritin: 190 ng/mL (ref 24–336)

## 2019-08-19 MED ORDER — EPOETIN ALFA-EPBX 10000 UNIT/ML IJ SOLN
20000.0000 [IU] | INTRAMUSCULAR | Status: DC
  Administered 2019-08-19: 20000 [IU] via SUBCUTANEOUS

## 2019-08-19 MED ORDER — EPOETIN ALFA-EPBX 10000 UNIT/ML IJ SOLN
INTRAMUSCULAR | Status: AC
  Filled 2019-08-19: qty 2

## 2019-09-02 ENCOUNTER — Other Ambulatory Visit: Payer: Self-pay

## 2019-09-02 ENCOUNTER — Encounter (HOSPITAL_COMMUNITY)
Admission: RE | Admit: 2019-09-02 | Discharge: 2019-09-02 | Disposition: A | Discharge status: Home / Self Care | Payer: Medicare Other | Source: Ambulatory Visit | Attending: Nephrology | Admitting: Nephrology

## 2019-09-02 VITALS — BP 147/77 | HR 87 | Temp 98.5°F | Resp 20

## 2019-09-02 DIAGNOSIS — N183 Chronic kidney disease, stage 3 unspecified: Secondary | ICD-10-CM | POA: Diagnosis not present

## 2019-09-02 LAB — POCT HEMOGLOBIN-HEMACUE: Hemoglobin: 12.6 g/dL — ABNORMAL LOW (ref 13.0–17.0)

## 2019-09-02 MED ORDER — EPOETIN ALFA-EPBX 10000 UNIT/ML IJ SOLN: 20000.0000 [IU] | INTRAMUSCULAR | Status: DC

## 2019-09-04 ENCOUNTER — Ambulatory Visit (INDEPENDENT_AMBULATORY_CARE_PROVIDER_SITE_OTHER): Payer: Medicare Other | Admitting: *Deleted

## 2019-09-04 ENCOUNTER — Other Ambulatory Visit: Payer: Self-pay

## 2019-09-04 DIAGNOSIS — E538 Deficiency of other specified B group vitamins: Secondary | ICD-10-CM | POA: Diagnosis not present

## 2019-09-04 MED ORDER — CYANOCOBALAMIN 1000 MCG/ML IJ SOLN
1000.0000 ug | Freq: Once | INTRAMUSCULAR | Status: AC
  Administered 2019-09-04: 1000 ug via INTRAMUSCULAR

## 2019-09-04 NOTE — Progress Notes (Signed)
Pls cosign for B12 inj../lmb  

## 2019-09-16 ENCOUNTER — Encounter (HOSPITAL_COMMUNITY)
Admission: RE | Admit: 2019-09-16 | Discharge: 2019-09-16 | Disposition: A | Discharge status: Home / Self Care | Payer: Medicare Other | Source: Ambulatory Visit | Attending: Nephrology | Admitting: Nephrology

## 2019-09-16 ENCOUNTER — Other Ambulatory Visit: Payer: Self-pay

## 2019-09-16 VITALS — BP 142/65 | HR 70 | Temp 97.6°F | Resp 20

## 2019-09-16 DIAGNOSIS — N183 Chronic kidney disease, stage 3 unspecified: Secondary | ICD-10-CM | POA: Diagnosis not present

## 2019-09-16 DIAGNOSIS — D631 Anemia in chronic kidney disease: Secondary | ICD-10-CM | POA: Insufficient documentation

## 2019-09-16 LAB — IRON AND TIBC
Iron: 103 ug/dL (ref 45–182)
Saturation Ratios: 40 % — ABNORMAL HIGH (ref 17.9–39.5)
TIBC: 258 ug/dL (ref 250–450)
UIBC: 155 ug/dL

## 2019-09-16 LAB — FERRITIN: Ferritin: 181 ng/mL (ref 24–336)

## 2019-09-16 LAB — POCT HEMOGLOBIN-HEMACUE: Hemoglobin: 12.2 g/dL — ABNORMAL LOW (ref 13.0–17.0)

## 2019-09-16 MED ORDER — EPOETIN ALFA-EPBX 10000 UNIT/ML IJ SOLN: 20000.0000 [IU] | INTRAMUSCULAR | Status: DC

## 2019-09-24 ENCOUNTER — Other Ambulatory Visit: Payer: Self-pay

## 2019-09-24 ENCOUNTER — Ambulatory Visit (INDEPENDENT_AMBULATORY_CARE_PROVIDER_SITE_OTHER): Payer: Medicare Other

## 2019-09-24 DIAGNOSIS — Z23 Encounter for immunization: Secondary | ICD-10-CM | POA: Diagnosis not present

## 2019-09-24 DIAGNOSIS — E538 Deficiency of other specified B group vitamins: Secondary | ICD-10-CM

## 2019-09-30 ENCOUNTER — Encounter (HOSPITAL_COMMUNITY)
Admission: RE | Admit: 2019-09-30 | Discharge: 2019-09-30 | Disposition: A | Discharge status: Home / Self Care | Payer: Medicare Other | Source: Ambulatory Visit | Attending: Nephrology | Admitting: Nephrology

## 2019-09-30 ENCOUNTER — Other Ambulatory Visit: Payer: Self-pay

## 2019-09-30 VITALS — BP 160/65 | HR 73 | Temp 97.8°F | Resp 20

## 2019-09-30 DIAGNOSIS — N183 Chronic kidney disease, stage 3 unspecified: Secondary | ICD-10-CM

## 2019-09-30 MED ORDER — EPOETIN ALFA-EPBX 10000 UNIT/ML IJ SOLN: 20000.0000 [IU] | INTRAMUSCULAR | Status: DC

## 2019-10-02 LAB — POCT HEMOGLOBIN-HEMACUE: Hemoglobin: 12.1 g/dL — ABNORMAL LOW (ref 13.0–17.0)

## 2019-10-09 ENCOUNTER — Other Ambulatory Visit: Payer: Self-pay

## 2019-10-09 ENCOUNTER — Other Ambulatory Visit: Payer: Self-pay | Admitting: Internal Medicine

## 2019-10-09 ENCOUNTER — Ambulatory Visit (INDEPENDENT_AMBULATORY_CARE_PROVIDER_SITE_OTHER): Payer: Medicare Other

## 2019-10-09 DIAGNOSIS — E538 Deficiency of other specified B group vitamins: Secondary | ICD-10-CM

## 2019-10-09 MED ORDER — CYANOCOBALAMIN 1000 MCG/ML IJ SOLN
1000.0000 ug | INTRAMUSCULAR | Status: DC
  Administered 2019-10-09 – 2020-05-18 (×8): 1000 ug via INTRAMUSCULAR

## 2019-10-09 NOTE — Progress Notes (Signed)
Pt here for monthly B12 injection per Dr Burns. B12 1000mcg given IM left deltoid and pt tolerated injection well.  Pt to schedule next B12 injection upon check out.  

## 2019-10-13 ENCOUNTER — Ambulatory Visit (INDEPENDENT_AMBULATORY_CARE_PROVIDER_SITE_OTHER): Payer: Medicare Other

## 2019-10-13 DIAGNOSIS — I5032 Chronic diastolic (congestive) heart failure: Secondary | ICD-10-CM

## 2019-10-13 LAB — CUP PACEART REMOTE DEVICE CHECK
Battery Impedance: 627 Ohm
Battery Remaining Longevity: 85 mo
Battery Voltage: 2.78 V
Brady Statistic AP VP Percent: 0 %
Brady Statistic AP VS Percent: 34 %
Brady Statistic AS VP Percent: 0 %
Brady Statistic AS VS Percent: 65 %
Date Time Interrogation Session: 20211005092539
Implantable Lead Implant Date: 20140321
Implantable Lead Implant Date: 20140321
Implantable Lead Location: 753859
Implantable Lead Location: 753860
Implantable Lead Model: 5076
Implantable Lead Model: 5092
Implantable Pulse Generator Implant Date: 20140321
Lead Channel Impedance Value: 427 Ohm
Lead Channel Impedance Value: 735 Ohm
Lead Channel Pacing Threshold Amplitude: 0.875 V
Lead Channel Pacing Threshold Amplitude: 0.875 V
Lead Channel Pacing Threshold Pulse Width: 0.4 ms
Lead Channel Pacing Threshold Pulse Width: 0.4 ms
Lead Channel Setting Pacing Amplitude: 2 V
Lead Channel Setting Pacing Amplitude: 2.5 V
Lead Channel Setting Pacing Pulse Width: 0.4 ms
Lead Channel Setting Sensing Sensitivity: 5.6 mV

## 2019-10-14 ENCOUNTER — Other Ambulatory Visit: Payer: Self-pay

## 2019-10-14 ENCOUNTER — Encounter (HOSPITAL_COMMUNITY)
Admission: RE | Admit: 2019-10-14 | Discharge: 2019-10-14 | Disposition: A | Discharge status: Home / Self Care | Payer: Medicare Other | Source: Ambulatory Visit | Attending: Nephrology | Admitting: Nephrology

## 2019-10-14 VITALS — BP 157/75 | HR 78 | Resp 18

## 2019-10-14 DIAGNOSIS — D631 Anemia in chronic kidney disease: Secondary | ICD-10-CM | POA: Diagnosis not present

## 2019-10-14 DIAGNOSIS — N183 Chronic kidney disease, stage 3 unspecified: Secondary | ICD-10-CM | POA: Diagnosis not present

## 2019-10-14 LAB — IRON AND TIBC
Iron: 61 ug/dL (ref 45–182)
Saturation Ratios: 24 % (ref 17.9–39.5)
TIBC: 258 ug/dL (ref 250–450)
UIBC: 197 ug/dL

## 2019-10-14 LAB — POCT HEMOGLOBIN-HEMACUE: Hemoglobin: 11.9 g/dL — ABNORMAL LOW (ref 13.0–17.0)

## 2019-10-14 LAB — FERRITIN: Ferritin: 180 ng/mL (ref 24–336)

## 2019-10-14 MED ORDER — EPOETIN ALFA-EPBX 10000 UNIT/ML IJ SOLN
INTRAMUSCULAR | Status: AC
  Administered 2019-10-14: 20000 [IU] via SUBCUTANEOUS
  Filled 2019-10-14: qty 2

## 2019-10-14 MED ORDER — EPOETIN ALFA-EPBX 10000 UNIT/ML IJ SOLN: 20000.0000 [IU] | INTRAMUSCULAR | Status: DC

## 2019-10-16 NOTE — Progress Notes (Signed)
Remote pacemaker transmission.   

## 2019-10-21 ENCOUNTER — Encounter: Payer: Self-pay | Admitting: Internal Medicine

## 2019-10-21 ENCOUNTER — Ambulatory Visit (INDEPENDENT_AMBULATORY_CARE_PROVIDER_SITE_OTHER): Payer: Medicare Other | Admitting: Internal Medicine

## 2019-10-21 ENCOUNTER — Other Ambulatory Visit: Payer: Self-pay

## 2019-10-21 VITALS — BP 140/70 | HR 79 | Temp 98.4°F | Ht 71.0 in | Wt 155.0 lb

## 2019-10-21 DIAGNOSIS — G479 Sleep disorder, unspecified: Secondary | ICD-10-CM

## 2019-10-21 DIAGNOSIS — R208 Other disturbances of skin sensation: Secondary | ICD-10-CM | POA: Diagnosis not present

## 2019-10-21 DIAGNOSIS — I25119 Atherosclerotic heart disease of native coronary artery with unspecified angina pectoris: Secondary | ICD-10-CM

## 2019-10-21 DIAGNOSIS — R200 Anesthesia of skin: Secondary | ICD-10-CM

## 2019-10-21 DIAGNOSIS — M79672 Pain in left foot: Secondary | ICD-10-CM

## 2019-10-21 HISTORY — DX: Anesthesia of skin: R20.0

## 2019-10-21 MED ORDER — TRAZODONE HCL 50 MG PO TABS: 25.0000 mg | ORAL_TABLET | Freq: Every evening | ORAL | 3 refills | Status: DC | PRN

## 2019-10-21 NOTE — Assessment & Plan Note (Signed)
Chronic Otc meds not working Will try trazodone 25-50 mg nightly He will let me know if this is not effective  Advised to try calm forte in addition to this

## 2019-10-21 NOTE — Assessment & Plan Note (Signed)
Acute Ice pick pain in med foot on dorsal aspect with numbness from here to toes - transient Has had several episodes, has different sensation from id foot to toes Will refer to podiatry for further eval/treatment

## 2019-10-21 NOTE — Assessment & Plan Note (Signed)
Acute Change in sensation in distal foot Has had several episodes of pain in mid foot To see podiatry

## 2019-10-21 NOTE — Patient Instructions (Addendum)
A referral was ordered for a foot doctor and someone will call you to schedule this.    Try the trazodone at night for sleep.  Start with 25 mg at night and increase to 50 mg nightly if needed.     You can calm forte as well as the trazodone at night for sleep.  This is over the counter.

## 2019-10-21 NOTE — Progress Notes (Signed)
Subjective:    Patient ID: [name], male    DOB: Nov 06, 1926, 86 y.o.   MRN: [id number]  HPI The patient is here for an acute visit.   Friday, 5 days ago he was walking in his house - had ice pick pain in mid dorsal aspect of his left foot that lasted 3-5 seconds.  After ice pick pain from that point to his toes becomes numb for about 25 seconds.  He this a couple of times Friday, Saturday, Sunday and once on Monday.  None yesterday or today.    Foot feels warm. He denies swelling and redness.  The ares on his mid dorsal foot to his toes feel different.     He still has the current feeling in his left upper leg - this is chronic.  Uses benadryl and autstralian cream and that help.   Still having sleep difficulties.  - has tried melatonin w/o improvement.   Medications and allergies reviewed with patient and updated if appropriate.  Patient Active Problem List   Diagnosis Date Noted  . Left inguinal hernia 07/01/2019  . Weight loss 06/18/2018  . Myositis 06/18/2018  . Gout involving toe of left foot 05/12/2018  . Elevated CK 05/12/2018  . Myoclonic jerking 02/10/2018  . Chronic diastolic CHF (congestive heart failure) ([place]) 02/05/2018  . UGIB (upper gastrointestinal bleed) 02/01/2018  . Lumbar post-laminectomy syndrome 12/12/2017  . Pain of left hip joint 11/29/2017  . Trochanteric bursitis of left hip 11/29/2017  . Hyperpigmentation 11/04/2017  . Spinal stenosis of lumbar region 10/09/2017  . Abnormal appearance of skin 10/07/2017  . Frequent urination 09/03/2017  . Gouty arthritis of right great toe 09/03/2017  . Postnasal drip 08/06/2017  . Rash and nonspecific skin eruption 08/06/2017  . Moderate aortic regurgitation 07/04/2017  . Diastolic dysfunction 42/59/5638  . Edema 06/29/2017  . DOE (dyspnea on exertion) 06/29/2017  . Sinus symptom 06/05/2017  . Fatigue 05/18/2017  . Sleeping difficulty 05/18/2017  . Foot pain, bilateral 04/22/2017  .  Trochanteric bursitis 01/10/2017  . Internal hemorrhoids 08/15/2016  . Dysphagia 02/06/2016  . Cephalalgia 07/14/2015  . Constipation 03/01/2015  . Myalgia 03/02/2014  . CHB (complete heart block) ([place]) 05/03/2012  . Postoperative atrial fibrillation ([place]) 05/03/2012  . Pacemaker 04/10/2012  . 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 Coronary atherosclerosis 11/27/2011  . Presence of aortocoronary bypass graft 10/24/2011  . Pulmonary nodule   . CAD (coronary artery disease)   . CKD (chronic kidney disease) stage 3, GFR 30-59 ml/min (HCC)   . Venous insufficiency of leg 06/05/2010  . SHOULDER PAIN, RIGHT, CHRONIC 10/14/2009  . B12 deficiency 08/23/2009  . Peripheral neuropathy 03/21/2009  . DEGENERATIVE DISC DISEASE, CERVICAL SPINE, W/RADICULOPATHY 09/21/2008  . Dyslipidemia 01/17/2007  . Essential hypertension 07/20/2006  . Prostate cancer ([place]) 07/20/2006    Current Outpatient Medications on File Prior to Visit  Medication Sig Dispense Refill  . allopurinol (ZYLOPRIM) 100 MG tablet Take 1 tablet (100 mg total) by mouth daily. 90 tablet 1  . Aromatic Inhalants (VICKS VAPOR INHALER IN) Place 1 puff into both nostrils as needed (for congestion).    [name] aspirin EC 81 MG tablet Take 81 mg by mouth daily.    . Carboxymeth-Glycerin-Polysorb (REFRESH OPTIVE MEGA-3 OP) Place 1 drop into both eyes 2 (two) times daily.    [name] epoetin alfa-epbx (RETACRIT) 2000 UNIT/ML injection 2,000 Units every 14 (fourteen) days. Patient states he only gets it when he  needs it but not every 2 weeks    . fluticasone (FLONASE) 50 MCG/ACT nasal spray Place 2 sprays into both nostrils daily. 16 g 6  . folic acid (FOLVITE) 1 MG tablet Take 1 tablet (1 mg total) by mouth daily. Annual appt due in May must see provider for future refills 90 tablet 0  . loratadine (CLARITIN) 10 MG tablet Take 10 mg by mouth daily as needed (for seasonal allergies).    . Multiple Vitamins-Minerals (PRESERVISION AREDS  2 PO) Take 1 capsule by mouth 2 (two) times daily.     . nitroGLYCERIN (NITROSTAT) 0.4 MG SL tablet DISSOLVE 1 TABLET UNDER THE TONGUE EVERY 5 MINUTES AS NEEDED FOR CHEST PAIN, MAXIMUM 3 TABLETS 100 tablet 1  . predniSONE (DELTASONE) 10 MG tablet 3 tabs po qd x 3 days, then 2 tabs po qd x 3 days, then 1 tab po qd x 3 days 18 tablet 0  . triamcinolone cream (KENALOG) 0.1 % Apply 1 application topically as needed (for itching- to affected sites).     . vitamin C (ASCORBIC ACID) 500 MG tablet Take 1,000 mg by mouth daily.     . Vitamin D, Ergocalciferol, (DRISDOL) 1.25 MG (50000 UT) CAPS capsule Take 50,000 Units by mouth every 30 (thirty) days.     . Zinc 50 MG CAPS Take 50 mg by mouth at bedtime.     [name] zinc sulfate 220 (50 Zn) MG capsule Take by mouth.    [name] amLODipine (NORVASC) 5 MG tablet Take 1 tablet (5 mg total) by mouth daily. 90 tablet 3  . furosemide (LASIX) 40 MG tablet Take 1 tablet (40 mg total) by mouth as needed for fluid or edema. 90 tablet 3   Current Facility-Administered Medications on File Prior to Visit  Medication Dose Route Frequency Provider Last Rate Last Admin  . cyanocobalamin ((VITAMIN B-12)) injection 1,000 mcg  1,000 mcg Intramuscular Q30 days [name], MD   1,000 mcg at 10/09/19 1405    Past Medical History:  Diagnosis Date  . Acute superficial venous thrombosis of lower extremity    a. RLE after CABG, neg dopp for DVT.  [name] Allergy   . Anemia   . Atrial fibrillation ([place])    a. Post-op from CABG, on amiodarone temporarily, d/c'd 12/2011  . CAD (coronary artery disease)    a. S/P stenting to mid RCA, prox PDA 06/1999. b. NSTEMI/CABG x 3 in 10/2011 with LIMA to LAD, SVG to PDA, and SVG to OM1.   . Chronic UTI    a. Followed by Dr. Risa Grill - colonized/asymptomatic - not on abx  . CKD (chronic kidney disease), stage III    a. stable with a creatinine around 1.9-2.0 followed by nephrology.  . Dyslipidemia   . Echocardiogram    Echocardiogram 08/2018: EF 50-55,  inf-lat AK, mild LVH, Gr 1 DD, RVSP 47, mild LAE, mild to mod MR, mild AI, mild AS (mean 11)  . GERD (gastroesophageal reflux disease)   . Hypertension    well-controlled.  . Myocardial infarction ([place])   . Neck injury    a. C3-C4 and C4-C5 foraminal narrowing, severe  . Prostate cancer ([place])    a. 2001 s/p TURP.  . Pulmonary nodule    a. felt to be noncancerous.  Status post followup CT scan 4 mm and stable.  . Renal artery stenosis ([place])    a. 50% by cath 2001  . Symptomatic bradycardia    Mobitz II AV block s/p  Medtronic pacemaker 03/28/12    Past Surgical History:  Procedure Laterality Date  . ANTERIOR CHAMBER WASHOUT Left 10/04/2018   Procedure: Anterior Chamber Washout, Vitreous Tap;  Surgeon: Jalene Mullet, MD;  Location: [facility];  Service: Ophthalmology;  Laterality: Left;  . cardia catherization  07-07-99  . CARDIAC SURGERY  10/18/12   open heart surgery  . CATARACT EXTRACTION W/ INTRAOCULAR LENS  IMPLANT, BILATERAL  3/205, 06/2013   [facility]  . COLONOSCOPY  04/12/07  . CORONARY ARTERY BYPASS GRAFT  10/19/2011   Procedure: CORONARY ARTERY BYPASS GRAFTING (CABG);  Surgeon: Gaye Pollack, MD;  Location: [facility];  Service: Open Heart Surgery;  Laterality: N/A;  times three using Left Internal Mammary Artery and Right Greater S[place]henouse Vein Graft harvested Endoscopically  . edg  07-17-1994  . FLEXIBLE SIGMOIDOSCOPY  11-03-1997  . GAS INSERTION Left 10/04/2018   Procedure: Insertion Of Gas;  Surgeon: Jalene Mullet, MD;  Location: [facility];  Service: Ophthalmology;  Laterality: Left;  [name] GAS/FLUID EXCHANGE Left 10/04/2018   Procedure: Gas/Fluid Exchange;  Surgeon: Jalene Mullet, MD;  Location: [facility];  Service: Ophthalmology;  Laterality: Left;  . LEFT HEART CATHETERIZATION WITH CORONARY ANGIOGRAM N/A 10/16/2011   Procedure: LEFT HEART CATHETERIZATION WITH CORONARY ANGIOGRAM;  Surgeon: Peter M Martinique, MD;  Location: [facility] CATH LAB;  Service: Cardiovascular;  Laterality: N/A;  . LEFT HEART  CATHETERIZATION WITH CORONARY/GRAFT ANGIOGRAM N/A 03/11/2013   Procedure: LEFT HEART CATHETERIZATION WITH [name];  Surgeon: Blane Ohara, MD;  Location: [facility] CATH LAB;  Service: Cardiovascular;  Laterality: N/A;  . lumbar spinal disk and neck fusion surgery    . PACEMAKER INSERTION  03/28/12   PPM implanted for mobitz II AV block  . PARS PLANA VITRECTOMY Left 10/04/2018   Procedure: PARS PLANA VITRECTOMY 25 GAUGE FOR ENDOPHTHALMITIS WITH INJECTION OF INTRAVITREAL ANTIBIOTIC;  Surgeon: Jalene Mullet, MD;  Location: [facility];  Service: Ophthalmology;  Laterality: Left;  . peripheral vascular catherization  11-24-03  . PERMANENT PACEMAKER INSERTION N/A 03/28/2012   Procedure: PERMANENT PACEMAKER INSERTION;  Surgeon: Thompson Grayer, MD;  Location: [facility] CATH LAB;  Service: Cardiovascular;  Laterality: N/A;  . PROSTATECTOMY    . renal circulation  10-01-03  . s/p ptca    . stents     X 2  . stress cardiolite  05-04-05   spring 09-negative except for apical thinning, EF 68%    Social History   Socioeconomic History  . Marital status: Married    Spouse name: [name]  . Number of children: 2  . Years of education: Not on file  . Highest education level: Some college, no degree  Occupational History  . 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 Smoking status: Never Smoker  . Smokeless tobacco: Never Used  Vaping Use  . Vaping Use: Never used  Substance and Sexual Activity  . Alcohol use: Yes    Comment: Rarely  . Drug use: No  . Sexual activity: Not on file  Other Topics Concern  . Not on file  Social History Narrative   HSG, 1 year college.  married '52 - 3 years, divorced; married '56 - 3 years divorced; married '59-12 yrs - divorced; married '75 -. 1 son '57; 1 daughter - '53; 1 grandchild.  work: [employer] 20 years -  mustered  out Dietitian; Research officer, trade union, retired.  Very happily married.  End of life care: yes CPR, no long term mechanical ventilation, no heroic measures.right handed   Social Determinants of Health   Financial Resource Strain:   . Difficulty of Paying Living Expenses: Not on file  Food Insecurity:   . Worried About Charity fundraiser in the Last Year: Not on file  . Ran Out of Food in the Last Year: Not on file  Transportation Needs:   . Lack of Transportation (Medical): Not on file  . Lack of Transportation (Non-Medical): Not on file  Physical Activity:   . Days of Exercise per Week: Not on file  . Minutes of Exercise per Session: Not on file  Stress:   . Feeling of Stress : Not on file  Social Connections:   . Frequency of Communication with Friends and Family: Not on file  . Frequency of Social Gatherings with Friends and Family: Not on file  . Attends Religious Services: Not on file  . Active Member of Clubs or Organizations: Not on file  . Attends Archivist Meetings: Not on file  . Marital Status: Not on file    Family History  Problem Relation Age of Onset  . Coronary artery disease Father        died @ 53  . Other Mother        cerebral hemorrhage - died @ 65  . Arthritis Mother   . Cancer Brother        Bladder  . Prostate cancer Brother   . Nephritis Brother        died @ age 48.  . Other Brother        cerebral hemorrhage - died @ 61  . Heart disease Brother   . Breast cancer Other        niece x 2  . Diabetes Neg Hx   . Colon cancer Neg Hx   . Adrenal disorder Neg Hx     Review of Systems     Objective:   Vitals:   10/21/19 1430  BP: 140/70  Pulse: 79  Temp: 98.4 F (36.9 C)  SpO2: 98%   BP Readings from Last 3 Encounters:  10/21/19 140/70  10/14/19 (!) 157/75  09/30/19 (!) 160/65   Wt Readings from Last 3 Encounters:  10/21/19 155 lb (70.3 kg)  07/21/19 155 lb (70.3 kg)  07/06/19 151 lb (68.5 kg)   Body mass  index is 21.62 kg/m.   Physical Exam         Assessment & Plan:    See Problem List for Assessment and Plan of chronic medical problems.    This visit occurred during the SARS-CoV-2 public health emergency.  Safety protocols were in place, including screening questions prior to the visit, additional usage of staff PPE, and extensive cleaning of exam room while observing appropriate contact time as indicated for disinfecting solutions.

## 2019-10-28 ENCOUNTER — Other Ambulatory Visit: Payer: Self-pay | Admitting: Urology

## 2019-10-28 ENCOUNTER — Other Ambulatory Visit: Payer: Self-pay

## 2019-10-28 ENCOUNTER — Encounter (HOSPITAL_COMMUNITY)
Admission: RE | Admit: 2019-10-28 | Discharge: 2019-10-28 | Disposition: A | Discharge status: Home / Self Care | Payer: Medicare Other | Source: Ambulatory Visit | Attending: Nephrology | Admitting: Nephrology

## 2019-10-28 VITALS — BP 131/66 | HR 71 | Temp 97.6°F | Resp 18

## 2019-10-28 DIAGNOSIS — N183 Chronic kidney disease, stage 3 unspecified: Secondary | ICD-10-CM

## 2019-10-28 DIAGNOSIS — R9721 Rising PSA following treatment for malignant neoplasm of prostate: Secondary | ICD-10-CM

## 2019-10-28 DIAGNOSIS — C61 Malignant neoplasm of prostate: Secondary | ICD-10-CM

## 2019-10-28 LAB — POCT HEMOGLOBIN-HEMACUE: Hemoglobin: 12.8 g/dL — ABNORMAL LOW (ref 13.0–17.0)

## 2019-10-28 MED ORDER — EPOETIN ALFA-EPBX 10000 UNIT/ML IJ SOLN: 20000.0000 [IU] | INTRAMUSCULAR | Status: DC

## 2019-11-03 ENCOUNTER — Encounter (HOSPITAL_COMMUNITY)
Admission: RE | Admit: 2019-11-03 | Discharge: 2019-11-03 | Disposition: A | Discharge status: Home / Self Care | Payer: Medicare Other | Source: Ambulatory Visit | Attending: Urology | Admitting: Urology

## 2019-11-03 ENCOUNTER — Other Ambulatory Visit: Payer: Self-pay

## 2019-11-03 DIAGNOSIS — R9721 Rising PSA following treatment for malignant neoplasm of prostate: Secondary | ICD-10-CM | POA: Insufficient documentation

## 2019-11-03 DIAGNOSIS — C61 Malignant neoplasm of prostate: Secondary | ICD-10-CM

## 2019-11-03 MED ORDER — TECHNETIUM TC 99M MEDRONATE IV KIT
20.0000 | PACK | Freq: Once | INTRAVENOUS | Status: AC | PRN
  Administered 2019-11-03: 20.3 via INTRAVENOUS

## 2019-11-05 ENCOUNTER — Encounter: Payer: Self-pay | Admitting: Sports Medicine

## 2019-11-05 ENCOUNTER — Other Ambulatory Visit: Payer: Self-pay

## 2019-11-05 ENCOUNTER — Ambulatory Visit (INDEPENDENT_AMBULATORY_CARE_PROVIDER_SITE_OTHER): Payer: Medicare Other | Admitting: Sports Medicine

## 2019-11-05 DIAGNOSIS — M79672 Pain in left foot: Secondary | ICD-10-CM | POA: Diagnosis not present

## 2019-11-05 DIAGNOSIS — M79671 Pain in right foot: Secondary | ICD-10-CM

## 2019-11-05 DIAGNOSIS — I25119 Atherosclerotic heart disease of native coronary artery with unspecified angina pectoris: Secondary | ICD-10-CM | POA: Diagnosis not present

## 2019-11-05 DIAGNOSIS — G609 Hereditary and idiopathic neuropathy, unspecified: Secondary | ICD-10-CM

## 2019-11-05 DIAGNOSIS — R208 Other disturbances of skin sensation: Secondary | ICD-10-CM

## 2019-11-05 DIAGNOSIS — R200 Anesthesia of skin: Secondary | ICD-10-CM

## 2019-11-05 MED ORDER — NON FORMULARY: 1.0000 "application " | Status: DC | PRN

## 2019-11-05 NOTE — Progress Notes (Signed)
Subjective: [name] is a 86 y.o. male patient who presents to office for evaluation of foot pain.  Patient reports that when he called for the appointment he was having pain on the top of the left foot but it seems like it has went away was sharp shooting in nature with pain that came and goes was pretty consistent the few weeks ago but now has stopped.  Patient reports that he is try over-the-counter [place] cream that seemed to help but does has this history of multiple areas of numbness and tingling to the upper and lower extremities reports that he had an injury in 1963 to the left lower extremity with damage to his leg and has had multiple nerve biopsies nerve studies and has follow-up with neurology.  Patient also reports that sometimes his feet get tired.  Patient denies any weakness but does have a history of sometimes with leg randomly contracting.  Patient denies any recent injury or trauma.  No other acute findings noted.  Patient is assisted by wife this visit.   Patient Active Problem List   Diagnosis Date Noted   Numbness of left foot 10/21/2019   S/P inguinal hernia repair 08/13/2019   Left inguinal hernia 07/01/2019   Weight loss 06/18/2018   Myositis 06/18/2018   Gout involving toe of left foot 05/12/2018   Elevated CK 05/12/2018   Myoclonic jerking 02/10/2018   Chronic diastolic CHF (congestive heart failure) ([place]) 02/05/2018   UGIB (upper gastrointestinal bleed) 02/01/2018   Lumbar post-laminectomy syndrome 12/12/2017   Pain of left hip joint 11/29/2017   Trochanteric bursitis of left hip 11/29/2017   Hyperpigmentation 11/04/2017   Spinal stenosis of lumbar region 10/09/2017   Abnormal appearance of skin 10/07/2017   Frequent urination 09/03/2017   Gouty arthritis of right great toe 09/03/2017   Postnasal drip 08/06/2017   Rash and nonspecific skin eruption 08/06/2017   Moderate aortic regurgitation 40/98/1191   Diastolic dysfunction  47/82/9562   Edema 06/29/2017   DOE (dyspnea on exertion) 06/29/2017   Sinus symptom 06/05/2017   Fatigue 05/18/2017   Sleeping difficulty 05/18/2017   Left foot pain 04/22/2017   Trochanteric bursitis 01/10/2017   Internal hemorrhoids 08/15/2016   Dysphagia 02/06/2016   Cephalalgia 07/14/2015   Constipation 03/01/2015   Myalgia 03/02/2014   CHB (complete heart block) ([place]) 05/03/2012   Postoperative atrial fibrillation ([place]) 05/03/2012   Pacemaker 04/10/2012   AV block, 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 03/26/2012   Coronary atherosclerosis 11/27/2011   Presence of aortocoronary bypass graft 10/24/2011   Pulmonary nodule    CAD (coronary artery disease)    CKD (chronic kidney disease) stage 3, GFR 30-59 ml/min (HCC)    Venous insufficiency of leg 06/05/2010   SHOULDER PAIN, RIGHT, CHRONIC 10/14/2009   B12 deficiency 08/23/2009   Peripheral neuropathy 03/21/2009   DEGENERATIVE DISC DISEASE, CERVICAL SPINE, W/RADICULOPATHY 09/21/2008   Dyslipidemia 01/17/2007   Essential hypertension 07/20/2006   Prostate cancer ([place]) 07/20/2006    Current Outpatient Medications on File Prior to Visit  Medication Sig Dispense Refill   allopurinol (ZYLOPRIM) 100 MG tablet Take 1 tablet (100 mg total) by mouth daily. 90 tablet 1   Aromatic Inhalants (VICKS VAPOR INHALER IN) Place 1 puff into both nostrils as needed (for congestion).     aspirin EC 81 MG tablet Take 81 mg by mouth daily.     Carboxymeth-Glycerin-Polysorb (REFRESH OPTIVE MEGA-3 OP) Place 1 drop into both eyes 2 (two) times daily.  epoetin alfa-epbx (RETACRIT) 2000 UNIT/ML injection 2,000 Units every 14 (fourteen) days. Patient states he only gets it when he needs it but not every 2 weeks     fluticasone (FLONASE) 50 MCG/ACT nasal spray Place 2 sprays into both nostrils daily. 16 g 6   folic acid (FOLVITE) 1 MG tablet Take 1 tablet (1 mg total) by mouth daily. Annual appt due in May must see  provider for future refills 90 tablet 0   loratadine (CLARITIN) 10 MG tablet Take 10 mg by mouth daily as needed (for seasonal allergies).     Multiple Vitamins-Minerals (PRESERVISION AREDS 2 PO) Take 1 capsule by mouth 2 (two) times daily.      nitroGLYCERIN (NITROSTAT) 0.4 MG SL tablet DISSOLVE 1 TABLET UNDER THE TONGUE EVERY 5 MINUTES AS NEEDED FOR CHEST PAIN, MAXIMUM 3 TABLETS 100 tablet 1   predniSONE (DELTASONE) 10 MG tablet 3 tabs po qd x 3 days, then 2 tabs po qd x 3 days, then 1 tab po qd x 3 days 18 tablet 0   traZODone (DESYREL) 50 MG tablet Take 0.5-1 tablets (25-50 mg total) by mouth at bedtime as needed for sleep. 30 tablet 3   triamcinolone cream (KENALOG) 0.1 % Apply 1 application topically as needed (for itching- to affected sites).      vitamin C (ASCORBIC ACID) 500 MG tablet Take 1,000 mg by mouth daily.      Vitamin D, Ergocalciferol, (DRISDOL) 1.25 MG (50000 UT) CAPS capsule Take 50,000 Units by mouth every 30 (thirty) days.      Zinc 50 MG CAPS Take 50 mg by mouth at bedtime.      zinc sulfate 220 (50 Zn) MG capsule Take by mouth.     amLODipine (NORVASC) 5 MG tablet Take 1 tablet (5 mg total) by mouth daily. 90 tablet 3   furosemide (LASIX) 40 MG tablet Take 1 tablet (40 mg total) by mouth as needed for fluid or edema. 90 tablet 3   Current Facility-Administered Medications on File Prior to Visit  Medication Dose Route Frequency Provider Last Rate Last Admin   cyanocobalamin ((VITAMIN B-12)) injection 1,000 mcg  1,000 mcg Intramuscular Q30 days [name], MD   1,000 mcg at 10/09/19 1405    Allergies  Allergen Reactions   Aspirin Nausea And Vomiting and Other (See Comments)    Upset stomach- tolerates coated aspirin    Lisinopril Anaphylaxis and Shortness Of Breath    After 3 tablets, he experienced trouble swallowing, throat tightness and hoarseness.    Amoxicillin Nausea Only   Atarax [Hydroxyzine Hcl] Nausea And Vomiting   Cephalexin  Diarrhea   Ciprofloxacin Nausea Only   Clindamycin Other (See Comments)    Reaction not recalled   Clobetasol Other (See Comments)    Not effective   Codeine Nausea Only and Other (See Comments)    Stomach upset   Fish Allergy Nausea And Vomiting   Fluarix [Flu Virus Vaccine] Itching   Haemophilus Influenzae Itching   Hydrocodone Nausea Only   Hydrocodone-Acetaminophen Nausea And Vomiting   Hydroxyzine Nausea And Vomiting   Latex Itching and Other (See Comments)    (After flu shot)   Macrobid [Nitrofurantoin Macrocrystal] Nausea Only   Niacin Other (See Comments)    Unknown reaction   Niacin-Lovastatin Er Other (See Comments)    Unsure of reaction. Taking simvastatin at home without problems   Niacin-Lovastatin Er Other (See Comments)    Reaction not recalled   Nitrofurantoin Other (See Comments)  Upset stomach    Omeprazole Other (See Comments)    Reaction not recalled- stopped by MD   Other Nausea And Vomiting   Vibramycin [Doxycycline] Other (See Comments)    Reaction not recalled   Adhesive [Tape] Rash   Bactrim [Sulfamethoxazole-Trimethoprim] Rash   Colchicine Rash   Gabapentin     Painful pimples on tongue    Objective:  General: Alert and oriented x3 in no acute distress  Dermatology: No open lesions bilateral lower extremities, no webspace macerations, no ecchymosis bilateral, all nails x 10 are well manicured.  Vascular: Dorsalis Pedis and Posterior Tibial pedal pulses palpable faintly, Capillary Fill Time 5 seconds,(+) pedal hair growth bilateral, no edema bilateral lower extremities, Temperature gradient within normal limits.  Varicosity noted bilateral.  Neurology: Gross sensation intact via light touch bilateral, protective sensation present however vibratory sensation severely diminished bilateral.  Subjective tingling and sharp shooting pains bilateral.   Musculoskeletal there is significant digital contracture and hammertoe  deformity noted.  There is fat pad atrophy noted bilateral.  Strength 4 out of 5 in all groups.   Assessment and Plan: Problem List Items Addressed This Visit      Nervous and Auditory   Peripheral neuropathy - Primary   Relevant Medications   NON FORMULARY 1 application     Other   Numbness of left foot    Other Visit Diagnoses    Foot pain, bilateral           -Complete examination performed -Discussed treatment options for likely neuropathy -Rx topical compound from [facility] heel lifts tissues and advised patient this works well may benefit from custom orthotics -Patient to return to office in 2 months for medication check or sooner if condition worsens.  Landis Martins, DPM

## 2019-11-09 ENCOUNTER — Other Ambulatory Visit: Payer: Self-pay

## 2019-11-09 ENCOUNTER — Ambulatory Visit (INDEPENDENT_AMBULATORY_CARE_PROVIDER_SITE_OTHER): Payer: Medicare Other

## 2019-11-09 DIAGNOSIS — E538 Deficiency of other specified B group vitamins: Secondary | ICD-10-CM

## 2019-11-09 NOTE — Progress Notes (Signed)
Pt here for monthly B12 injection per Dr Burns. B12 1000mcg given IM left deltoid and pt tolerated injection well.  Pt to schedule next B12 injection upon check out.  

## 2019-11-11 ENCOUNTER — Encounter (HOSPITAL_COMMUNITY): Payer: Medicare Other

## 2019-11-12 ENCOUNTER — Ambulatory Visit (HOSPITAL_COMMUNITY)
Admission: RE | Admit: 2019-11-12 | Discharge: 2019-11-12 | Disposition: A | Discharge status: Home / Self Care | Payer: Medicare Other | Source: Ambulatory Visit | Attending: Nephrology | Admitting: Nephrology

## 2019-11-12 ENCOUNTER — Other Ambulatory Visit: Payer: Self-pay

## 2019-11-12 VITALS — BP 159/83 | HR 60 | Temp 97.7°F | Resp 18

## 2019-11-12 DIAGNOSIS — N183 Chronic kidney disease, stage 3 unspecified: Secondary | ICD-10-CM | POA: Diagnosis not present

## 2019-11-12 DIAGNOSIS — D631 Anemia in chronic kidney disease: Secondary | ICD-10-CM | POA: Insufficient documentation

## 2019-11-12 LAB — IRON AND TIBC
Iron: 46 ug/dL (ref 45–182)
Saturation Ratios: 17 % — ABNORMAL LOW (ref 17.9–39.5)
TIBC: 270 ug/dL (ref 250–450)
UIBC: 224 ug/dL

## 2019-11-12 LAB — FERRITIN: Ferritin: 164 ng/mL (ref 24–336)

## 2019-11-12 LAB — POCT HEMOGLOBIN-HEMACUE: Hemoglobin: 12.8 g/dL — ABNORMAL LOW (ref 13.0–17.0)

## 2019-11-12 MED ORDER — EPOETIN ALFA-EPBX 10000 UNIT/ML IJ SOLN: 20000.0000 [IU] | INTRAMUSCULAR | Status: DC

## 2019-11-26 ENCOUNTER — Other Ambulatory Visit: Payer: Self-pay

## 2019-11-26 ENCOUNTER — Encounter (HOSPITAL_COMMUNITY)
Admission: RE | Admit: 2019-11-26 | Discharge: 2019-11-26 | Disposition: A | Discharge status: Home / Self Care | Payer: Medicare Other | Source: Ambulatory Visit | Attending: Nephrology | Admitting: Nephrology

## 2019-11-26 VITALS — BP 160/92 | HR 82 | Temp 97.4°F | Resp 18

## 2019-11-26 DIAGNOSIS — N183 Chronic kidney disease, stage 3 unspecified: Secondary | ICD-10-CM | POA: Diagnosis present

## 2019-11-26 LAB — POCT HEMOGLOBIN-HEMACUE: Hemoglobin: 12.6 g/dL — ABNORMAL LOW (ref 13.0–17.0)

## 2019-11-26 MED ORDER — EPOETIN ALFA-EPBX 10000 UNIT/ML IJ SOLN: 20000.0000 [IU] | INTRAMUSCULAR | Status: DC

## 2019-12-09 ENCOUNTER — Ambulatory Visit: Payer: Medicare Other

## 2019-12-09 ENCOUNTER — Other Ambulatory Visit: Payer: Self-pay

## 2019-12-09 ENCOUNTER — Ambulatory Visit (INDEPENDENT_AMBULATORY_CARE_PROVIDER_SITE_OTHER): Payer: Medicare Other

## 2019-12-09 DIAGNOSIS — E538 Deficiency of other specified B group vitamins: Secondary | ICD-10-CM

## 2019-12-09 NOTE — Progress Notes (Signed)
Pt here for monthly B12 injection per Dr Burns.   B12 1000mcg given IM left deltoid and pt tolerated injection well. 

## 2019-12-10 ENCOUNTER — Encounter (HOSPITAL_COMMUNITY)
Admission: RE | Admit: 2019-12-10 | Discharge: 2019-12-10 | Disposition: A | Discharge status: Home / Self Care | Payer: Medicare Other | Source: Ambulatory Visit | Attending: Nephrology | Admitting: Nephrology

## 2019-12-10 VITALS — BP 135/77 | HR 86 | Temp 98.1°F | Resp 18

## 2019-12-10 DIAGNOSIS — N183 Chronic kidney disease, stage 3 unspecified: Secondary | ICD-10-CM | POA: Diagnosis not present

## 2019-12-10 DIAGNOSIS — D631 Anemia in chronic kidney disease: Secondary | ICD-10-CM | POA: Insufficient documentation

## 2019-12-10 LAB — POCT HEMOGLOBIN-HEMACUE: Hemoglobin: 12 g/dL — ABNORMAL LOW (ref 13.0–17.0)

## 2019-12-10 LAB — IRON AND TIBC
Iron: 61 ug/dL (ref 45–182)
Saturation Ratios: 23 % (ref 17.9–39.5)
TIBC: 270 ug/dL (ref 250–450)
UIBC: 209 ug/dL

## 2019-12-10 LAB — FERRITIN: Ferritin: 194 ng/mL (ref 24–336)

## 2019-12-10 MED ORDER — EPOETIN ALFA-EPBX 10000 UNIT/ML IJ SOLN: 20000.0000 [IU] | INTRAMUSCULAR | Status: DC

## 2019-12-17 ENCOUNTER — Other Ambulatory Visit: Payer: Self-pay

## 2019-12-17 ENCOUNTER — Ambulatory Visit (INDEPENDENT_AMBULATORY_CARE_PROVIDER_SITE_OTHER): Payer: Medicare Other | Admitting: Sports Medicine

## 2019-12-17 ENCOUNTER — Encounter: Payer: Self-pay | Admitting: Sports Medicine

## 2019-12-17 DIAGNOSIS — M79671 Pain in right foot: Secondary | ICD-10-CM | POA: Diagnosis not present

## 2019-12-17 DIAGNOSIS — I25119 Atherosclerotic heart disease of native coronary artery with unspecified angina pectoris: Secondary | ICD-10-CM

## 2019-12-17 DIAGNOSIS — R200 Anesthesia of skin: Secondary | ICD-10-CM

## 2019-12-17 DIAGNOSIS — R208 Other disturbances of skin sensation: Secondary | ICD-10-CM | POA: Diagnosis not present

## 2019-12-17 DIAGNOSIS — M79672 Pain in left foot: Secondary | ICD-10-CM | POA: Diagnosis not present

## 2019-12-17 DIAGNOSIS — G609 Hereditary and idiopathic neuropathy, unspecified: Secondary | ICD-10-CM

## 2019-12-17 NOTE — Progress Notes (Signed)
Subjective: [name] is a 86 y.o. male patient who returns to office for follow up on bilateral sharp shooting pains reports that the cream is  Occasionally helpful but is concerned because his legs feel weak. No other acute findings noted.  Patient is assisted by wife this visit.   Patient Active Problem List   Diagnosis Date Noted  . Numbness of left foot 10/21/2019  . S/P inguinal hernia repair 08/13/2019  . Left inguinal hernia 07/01/2019  . Weight loss 06/18/2018  . Myositis 06/18/2018  . Gout involving toe of left foot 05/12/2018  . Elevated CK 05/12/2018  . Myoclonic jerking 02/10/2018  . Chronic diastolic CHF (congestive heart failure) ([place]) 02/05/2018  . UGIB (upper gastrointestinal bleed) 02/01/2018  . Lumbar post-laminectomy syndrome 12/12/2017  . Pain of left hip joint 11/29/2017  . Trochanteric bursitis of left hip 11/29/2017  . Hyperpigmentation 11/04/2017  . Spinal stenosis of lumbar region 10/09/2017  . Abnormal appearance of skin 10/07/2017  . Frequent urination 09/03/2017  . Gouty arthritis of right great toe 09/03/2017  . Postnasal drip 08/06/2017  . Rash and nonspecific skin eruption 08/06/2017  . Moderate aortic regurgitation 07/04/2017  . Diastolic dysfunction 70/96/2836  . Edema 06/29/2017  . DOE (dyspnea on exertion) 06/29/2017  . Sinus symptom 06/05/2017  . Fatigue 05/18/2017  . Sleeping difficulty 05/18/2017  . Left foot pain 04/22/2017  . Trochanteric bursitis 01/10/2017  . Internal hemorrhoids 08/15/2016  . Dysphagia 02/06/2016  . Cephalalgia 07/14/2015  . Constipation 03/01/2015  . Myalgia 03/02/2014  . CHB (complete heart block) ([place]) 05/03/2012  . Postoperative atrial fibrillation ([place]) 05/03/2012  . Pacemaker 04/10/2012  . 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 Coronary atherosclerosis 11/27/2011  . Presence of aortocoronary bypass graft 10/24/2011  . Pulmonary nodule   . CAD (coronary artery disease)    . CKD (chronic kidney disease) stage 3, GFR 30-59 ml/min (HCC)   . Venous insufficiency of leg 06/05/2010  . SHOULDER PAIN, RIGHT, CHRONIC 10/14/2009  . B12 deficiency 08/23/2009  . Peripheral neuropathy 03/21/2009  . DEGENERATIVE DISC DISEASE, CERVICAL SPINE, W/RADICULOPATHY 09/21/2008  . Dyslipidemia 01/17/2007  . Essential hypertension 07/20/2006  . Prostate cancer ([place]) 07/20/2006    Current Outpatient Medications on File Prior to Visit  Medication Sig Dispense Refill  . allopurinol (ZYLOPRIM) 100 MG tablet Take 1 tablet (100 mg total) by mouth daily. 90 tablet 1  . Aromatic Inhalants (VICKS VAPOR INHALER IN) Place 1 puff into both nostrils as needed (for congestion).    [name] aspirin EC 81 MG tablet Take 81 mg by mouth daily.    . Carboxymeth-Glycerin-Polysorb (REFRESH OPTIVE MEGA-3 OP) Place 1 drop into both eyes 2 (two) times daily.    [name] epoetin alfa-epbx (RETACRIT) 2000 UNIT/ML injection 2,000 Units every 14 (fourteen) days. Patient states he only gets it when he needs it but not every 2 weeks    . fluticasone (FLONASE) 50 MCG/ACT nasal spray Place 2 sprays into both nostrils daily. 16 g 6  . folic acid (FOLVITE) 1 MG tablet Take 1 tablet (1 mg total) by mouth daily. Annual appt due in May must see provider for future refills 90 tablet 0  . leuprolide, 6 Month, (ELIGARD) 45 MG injection Inject 45 mg into the skin every 6 (six) months.    . loratadine (CLARITIN) 10 MG tablet Take 10 mg by mouth daily as needed (for seasonal allergies).    . Multiple Vitamins-Minerals (PRESERVISION AREDS 2 PO) Take 1  capsule by mouth 2 (two) times daily.     . nitroGLYCERIN (NITROSTAT) 0.4 MG SL tablet DISSOLVE 1 TABLET UNDER THE TONGUE EVERY 5 MINUTES AS NEEDED FOR CHEST PAIN, MAXIMUM 3 TABLETS 100 tablet 1  . predniSONE (DELTASONE) 10 MG tablet 3 tabs po qd x 3 days, then 2 tabs po qd x 3 days, then 1 tab po qd x 3 days 18 tablet 0  . traZODone (DESYREL) 50 MG tablet Take 0.5-1 tablets (25-50 mg total)  by mouth at bedtime as needed for sleep. 30 tablet 3  . triamcinolone cream (KENALOG) 0.1 % Apply 1 application topically as needed (for itching- to affected sites).     . vitamin C (ASCORBIC ACID) 500 MG tablet Take 1,000 mg by mouth daily.     . Vitamin D, Ergocalciferol, (DRISDOL) 1.25 MG (50000 UT) CAPS capsule Take 50,000 Units by mouth every 30 (thirty) days.     . Zinc 50 MG CAPS Take 50 mg by mouth at bedtime.     [name] zinc sulfate 220 (50 Zn) MG capsule Take by mouth.    [name] amLODipine (NORVASC) 5 MG tablet Take 1 tablet (5 mg total) by mouth daily. 90 tablet 3  . furosemide (LASIX) 40 MG tablet Take 1 tablet (40 mg total) by mouth as needed for fluid or edema. 90 tablet 3   Current Facility-Administered Medications on File Prior to Visit  Medication Dose Route Frequency Provider Last Rate Last Admin  . cyanocobalamin ((VITAMIN B-12)) injection 1,000 mcg  1,000 mcg Intramuscular Q30 days [name], MD   1,000 mcg at 12/09/19 1420  . NON FORMULARY 1 application  1 application Topical PRN Landis Martins, DPM        Allergies  Allergen Reactions  . Aspirin Nausea And Vomiting and Other (See Comments)    Upset stomach- tolerates coated aspirin   . Lisinopril Anaphylaxis and Shortness Of Breath    After 3 tablets, he experienced trouble swallowing, throat tightness and hoarseness.   . Amoxicillin Nausea Only  . Atarax [Hydroxyzine Hcl] Nausea And Vomiting  . Cephalexin Diarrhea  . Ciprofloxacin Nausea Only  . Clindamycin Other (See Comments)    Reaction not recalled  . Clobetasol Other (See Comments)    Not effective  . Codeine Nausea Only and Other (See Comments)    Stomach upset  . Fish Allergy Nausea And Vomiting  . Fluarix [Flu Virus Vaccine] Itching  . Haemophilus Influenzae Itching  . Hydrocodone Nausea Only  . Hydrocodone-Acetaminophen Nausea And Vomiting  . Hydroxyzine Nausea And Vomiting  . Latex Itching and Other (See Comments)    (After flu shot)  . Macrobid  [Nitrofurantoin Macrocrystal] Nausea Only  . Niacin Other (See Comments)    Unknown reaction  . Niacin-Lovastatin Er Other (See Comments)    Unsure of reaction. Taking simvastatin at home without problems  . Niacin-Lovastatin Er Other (See Comments)    Reaction not recalled  . Nitrofurantoin Other (See Comments)    Upset stomach   . Omeprazole Other (See Comments)    Reaction not recalled- stopped by MD  . Other Nausea And Vomiting  . Vibramycin [Doxycycline] Other (See Comments)    Reaction not recalled  . Adhesive [Tape] Rash  . Bactrim [Sulfamethoxazole-Trimethoprim] Rash  . Colchicine Rash  . Gabapentin     Painful pimples on tongue    Objective:  General: Alert and oriented x3 in no acute distress  Dermatology: No open lesions bilateral lower extremities, no webspace macerations,  no ecchymosis bilateral, all nails x 10 are well manicured right 2nd toenail and left 1st toenail is thickened with no signs of acute infection.   Vascular: Dorsalis Pedis and Posterior Tibial pedal pulses palpable faintly, Capillary Fill Time 5 seconds,(+) scant pedal hair growth bilateral, no edema bilateral lower extremities, Temperature gradient within normal limits.  Varicosites noted bilateral.  Neurology: Gross sensation intact. Slightly improved subjective tingling and sharp shooting pains bilateral.   Musculoskeletal there is significant digital contracture and hammertoe deformity noted.  There is fat pad atrophy noted bilateral.  Strength 4 out of 5 in all groups.   Assessment and Plan: Problem List Items Addressed This Visit      Nervous and Auditory   Peripheral neuropathy - Primary     Other   Numbness of left foot    Other Visit Diagnoses    Foot pain, bilateral           -Complete examination performed -Discussed treatment options for likely neuropathy -Continue with topical compound from [place] with heel lifts and good supportive shoes -Recommend  use of heat as needed for pain -Continue with tylenol as needed for pain at night -Recommend continue with home exercises/self PT and Cane for stability -Advised patient he may want to f/u with his neurologist since I have added on topical nerve/pain compound -Patient to return to office as needed.   Landis Martins, DPM

## 2019-12-22 ENCOUNTER — Ambulatory Visit (INDEPENDENT_AMBULATORY_CARE_PROVIDER_SITE_OTHER): Payer: Medicare Other | Admitting: Neurology

## 2019-12-22 ENCOUNTER — Other Ambulatory Visit: Payer: Self-pay

## 2019-12-22 ENCOUNTER — Encounter: Payer: Self-pay | Admitting: Neurology

## 2019-12-22 VITALS — BP 110/57 | HR 63 | Resp 20 | Ht 71.0 in | Wt 156.0 lb

## 2019-12-22 DIAGNOSIS — G5793 Unspecified mononeuropathy of bilateral lower limbs: Secondary | ICD-10-CM

## 2019-12-22 DIAGNOSIS — I25119 Atherosclerotic heart disease of native coronary artery with unspecified angina pectoris: Secondary | ICD-10-CM | POA: Diagnosis not present

## 2019-12-22 MED ORDER — NORTRIPTYLINE HCL 10 MG PO CAPS: 10.0000 mg | ORAL_CAPSULE | Freq: Every day | ORAL | 5 refills | Status: DC
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
## 2019-12-22 NOTE — Patient Instructions (Signed)
1.  For nerve pain, start nortriptyline 10mg  at bedtime.  Run it by your kidney doctor first.  If no improvement in pain by end of prescription, contact me and we can increase dose 2.  Follow up 4 months.

## 2019-12-23 ENCOUNTER — Encounter: Payer: Self-pay | Admitting: Cardiovascular Disease

## 2019-12-23 ENCOUNTER — Ambulatory Visit (INDEPENDENT_AMBULATORY_CARE_PROVIDER_SITE_OTHER): Payer: Medicare Other | Admitting: Cardiovascular Disease

## 2019-12-23 VITALS — BP 110/70 | HR 81 | Ht 71.0 in | Wt 155.0 lb

## 2019-12-23 DIAGNOSIS — I340 Nonrheumatic mitral (valve) insufficiency: Secondary | ICD-10-CM | POA: Diagnosis not present

## 2019-12-23 DIAGNOSIS — N1832 Chronic kidney disease, stage 3b: Secondary | ICD-10-CM

## 2019-12-23 DIAGNOSIS — I350 Nonrheumatic aortic (valve) stenosis: Secondary | ICD-10-CM | POA: Diagnosis not present

## 2019-12-23 DIAGNOSIS — I25119 Atherosclerotic heart disease of native coronary artery with unspecified angina pectoris: Secondary | ICD-10-CM

## 2019-12-23 DIAGNOSIS — I2510 Atherosclerotic heart disease of native coronary artery without angina pectoris: Secondary | ICD-10-CM | POA: Diagnosis not present

## 2019-12-23 DIAGNOSIS — I10 Essential (primary) hypertension: Secondary | ICD-10-CM

## 2019-12-23 NOTE — Progress Notes (Signed)
Cardiology Office Note:    Date:  12/23/2019   ID:  KREED KAUFFMAN, DOB [birth year], MRN [id number]  PCP:  [name], MD  [place] Cardiologist:  [name], MD  [place] Electrophysiologist:  None   Referring MD: [name], MD   Chief Complaint  Patient presents with  . Shortness of Breath    History of Present Illness:    [name] is a 86 y.o. male with a hx of  coronary artery disease, chronic kidney disease, and mild aortic stenosis, presenting for follow-up evaluation.  The patient has a history of RCA stenting. He ultimately was treated with multivessel CABG in 2013 with early graft failure of all of his venous conduits and continued patency of the mammary artery to LAD graft. He had a nuclear stress test in 2019 demonstrating no significant ischemia. Comorbid medical conditions include chronic diastolic heart failure, symptomatic bradycardia status post permanent pacemaker, chronic kidney disease stage 3, mixed hyperlipidemia, hypertension, prostate cancer, and chronic anemia.  The patient is here with his wife today. He complains of exertional dyspnea. States that he has to rest a few times when he 'washes up' in the mornings. He also complains of bilateral leg weakness and he cannot stand up for very long at all. No chest pain or pressure.  No edema, orthopnea, or PND.  He is happy with his home blood pressures which are reviewed today.  Average blood pressures are in the 130s over 70s.  He is concerned about problems he is having with his low back.  He states that he needs an MRI in order to consider surgery for treatment of what he thinks is spinal stenosis.  However, he does not have an MRI compatible pacemaker.  He asks me about whether his pacemaker can be changed out.  Past Medical History:  Diagnosis Date  . Acute superficial venous thrombosis of lower extremity    a. RLE after CABG, neg dopp for DVT.  [name] Allergy   . Anemia   .  Atrial fibrillation ([place])    a. Post-op from CABG, on amiodarone temporarily, d/c'd 12/2011  . CAD (coronary artery disease)    a. S/P stenting to mid RCA, prox PDA 06/1999. b. NSTEMI/CABG x 3 in 10/2011 with LIMA to LAD, SVG to PDA, and SVG to OM1.   . Chronic UTI    a. Followed by Dr. Risa Grill - colonized/asymptomatic - not on abx  . CKD (chronic kidney disease), stage III (HCC)    a. stable with a creatinine around 1.9-2.0 followed by nephrology.  . Dyslipidemia   . Echocardiogram    Echocardiogram 08/2018: EF 50-55, inf-lat AK, mild LVH, Gr 1 DD, RVSP 47, mild LAE, mild to mod MR, mild AI, mild AS (mean 11)  . GERD (gastroesophageal reflux disease)   . Hypertension    well-controlled.  . Myocardial infarction ([name])   . Neck injury    a. C3-C4 and C4-C5 foraminal narrowing, severe  . Prostate cancer ([name])    a. 2001 s/p TURP.  . Pulmonary nodule    a. felt to be noncancerous.  Status post followup CT scan 4 mm and stable.  . Renal artery stenosis ([name])    a. 50% by cath 2001  . 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 ANTERIOR CHAMBER WASHOUT Left 10/04/2018   Procedure: Anterior Chamber Washout, Vitreous Tap;  Surgeon: Jalene Mullet, MD;  Location: [place];  Service: Ophthalmology;  Laterality: Left;  . cardia catherization  07-07-99  . CARDIAC SURGERY  10/18/12   open heart surgery  . CATARACT EXTRACTION W/ INTRAOCULAR LENS  IMPLANT, BILATERAL  3/205, 06/2013   [facility]  . COLONOSCOPY  04/12/07  . CORONARY ARTERY BYPASS GRAFT  10/19/2011   Procedure: CORONARY ARTERY BYPASS GRAFTING (CABG);  Surgeon: Gaye Pollack, MD;  Location: [place];  Service: Open Heart Surgery;  Laterality: N/A;  times three using Left Internal Mammary Artery and Right Greater S[place]henouse Vein Graft harvested Endoscopically  . edg  07-17-1994  . FLEXIBLE SIGMOIDOSCOPY  11-03-1997  . GAS INSERTION Left 10/04/2018    Procedure: Insertion Of Gas;  Surgeon: Jalene Mullet, MD;  Location: [place];  Service: Ophthalmology;  Laterality: Left;  [name] GAS/FLUID EXCHANGE Left 10/04/2018   Procedure: Gas/Fluid Exchange;  Surgeon: Jalene Mullet, MD;  Location: [place];  Service: Ophthalmology;  Laterality: Left;  . LEFT HEART CATHETERIZATION WITH CORONARY ANGIOGRAM N/A 10/16/2011   Procedure: LEFT HEART CATHETERIZATION WITH CORONARY ANGIOGRAM;  Surgeon: Peter M Martinique, MD;  Location: [facility] CATH LAB;  Service: Cardiovascular;  Laterality: N/A;  . LEFT HEART CATHETERIZATION WITH CORONARY/GRAFT ANGIOGRAM N/A 03/11/2013   Procedure: LEFT HEART CATHETERIZATION WITH [name];  Surgeon: Blane Ohara, MD;  Location: [facility] CATH LAB;  Service: Cardiovascular;  Laterality: N/A;  . lumbar spinal disk and neck fusion surgery    . PACEMAKER INSERTION  03/28/12   PPM implanted for mobitz II AV block  . PARS PLANA VITRECTOMY Left 10/04/2018   Procedure: PARS PLANA VITRECTOMY 25 GAUGE FOR ENDOPHTHALMITIS WITH INJECTION OF INTRAVITREAL ANTIBIOTIC;  Surgeon: Jalene Mullet, MD;  Location: [place];  Service: Ophthalmology;  Laterality: Left;  . peripheral vascular catherization  11-24-03  . PERMANENT PACEMAKER INSERTION N/A 03/28/2012   Procedure: PERMANENT PACEMAKER INSERTION;  Surgeon: Thompson Grayer, MD;  Location: [facility] CATH LAB;  Service: Cardiovascular;  Laterality: N/A;  . PROSTATECTOMY    . renal circulation  10-01-03  . s/p ptca    . stents     X 2  . stress cardiolite  05-04-05   spring 09-negative except for apical thinning, EF 68%    Current Medications: Current Meds  Medication Sig  . acetaminophen (TYLENOL) 500 MG tablet Take 500 mg by mouth every 6 (six) hours as needed.  [name] allopurinol (ZYLOPRIM) 100 MG tablet Take 1 tablet (100 mg total) by mouth daily.  [name] amLODipine (NORVASC) 5 MG tablet Take 1 tablet (5 mg total) by mouth daily.  . Aromatic Inhalants (VICKS VAPOR INHALER IN) Place 1 puff into both nostrils as needed  (for congestion).  [name] aspirin EC 81 MG tablet Take 81 mg by mouth daily.  . Calcium Citrate-Vitamin D (CITRACAL + D PO) Take by mouth. Per patient taking 1200 mg at night  . Carboxymeth-Glycerin-Polysorb (REFRESH OPTIVE MEGA-3 OP) Place 1 drop into both eyes 2 (two) times daily.  [name] epoetin alfa-epbx (RETACRIT) 2000 UNIT/ML injection 2,000 Units every 14 (fourteen) days. Patient states he only gets it when he needs it but not every 2 weeks  . folic acid (FOLVITE) 1 MG tablet Take 1 tablet (1 mg total) by mouth daily. Annual appt due in May must see provider for future refills  . leuprolide, 6 Month, (ELIGARD) 45 MG injection Inject 45 mg into the skin every 6 (six) months.  . loratadine (CLARITIN) 10 MG tablet Take 10 mg by mouth daily as needed (for  seasonal allergies).  . Multiple Vitamins-Minerals (PRESERVISION AREDS 2 PO) Take 1 capsule by mouth 2 (two) times daily.   . nitroGLYCERIN (NITROSTAT) 0.4 MG SL tablet DISSOLVE 1 TABLET UNDER THE TONGUE EVERY 5 MINUTES AS NEEDED FOR CHEST PAIN, MAXIMUM 3 TABLETS  . nortriptyline (PAMELOR) 10 MG capsule Take 1 capsule (10 mg total) by mouth at bedtime.  . predniSONE (DELTASONE) 10 MG tablet 3 tabs po qd x 3 days, then 2 tabs po qd x 3 days, then 1 tab po qd x 3 days  . traZODone (DESYREL) 50 MG tablet Take 0.5-1 tablets (25-50 mg total) by mouth at bedtime as needed for sleep.  [name] triamcinolone cream (KENALOG) 0.1 % Apply 1 application topically as needed (for itching- to affected sites).   . vitamin C (ASCORBIC ACID) 500 MG tablet Take 1,000 mg by mouth daily.   . Vitamin D, Ergocalciferol, (DRISDOL) 1.25 MG (50000 UT) CAPS capsule Take 50,000 Units by mouth every 30 (thirty) days.   . Zinc 50 MG CAPS Take 50 mg by mouth at bedtime.    Current Facility-Administered Medications for the 12/23/19 encounter (Office Visit) with [name], MD  Medication  . cyanocobalamin ((VITAMIN B-12)) injection 1,000 mcg  . NON FORMULARY 1 application      Allergies:   Aspirin, Lisinopril, Amoxicillin, Atarax [hydroxyzine hcl], Cephalexin, Ciprofloxacin, Clindamycin, Clobetasol, Codeine, Fish allergy, Fluarix [flu virus vaccine], Haemophilus influenzae, Hydrocodone, Hydrocodone-acetaminophen, Hydroxyzine, Latex, Macrobid [nitrofurantoin macrocrystal], Niacin, Niacin-lovastatin er, Niacin-lovastatin er, Nitrofurantoin, Omeprazole, Other, Vibramycin [doxycycline], Adhesive [tape], Bactrim [sulfamethoxazole-trimethoprim], Colchicine, and Gabapentin   Social History   Socioeconomic History  . Marital status: Married    Spouse name: [name]  . Number of children: 2  . Years of education: Not on file  . Highest education level: Some college, no degree  Occupational History  . 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 Smoking status: Never Smoker  . Smokeless tobacco: Never Used  Vaping Use  . Vaping Use: Never used  Substance and Sexual Activity  . Alcohol use: Yes    Comment: Rarely  . Drug use: No  . Sexual activity: Not on file  Other Topics Concern  . Not on file  Social History Narrative   HSG, 1 year college.  married '52 - 3 years, divorced; married '56 - 3 years divorced; married '17-12 yrs - divorced; married '75 -. 1 son '57; 1 daughter - '53; 1 grandchild.  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  Very happily married.  End of life care: yes CPR, no long term mechanical ventilation, no heroic measures.right handed   Right handed   Social Determinants of Health   Financial Resource Strain: Not on file  Food Insecurity: Not on file  Transportation Needs: Not on file  Physical Activity: Not on file  Stress: Not on file  Social Connections: Not on file     Family History: The patient's family history includes Arthritis in his  mother; Breast cancer in an other family member; Cancer in his brother; Coronary artery disease in his father; Heart disease in his brother; Nephritis in his brother; Other in his brother and mother; Prostate cancer in his brother. There is no history of Diabetes, Colon cancer, or Adrenal disorder.  ROS:   Please see the history  of present illness.    All other systems reviewed and are negative.  EKGs/Labs/Other Studies Reviewed:    The following studies were reviewed today: Echo 07-07-2019: IMPRESSIONS    1. Left ventricular ejection fraction, by estimation, is 55 to 60%. The  left ventricle has normal function. The left ventricle has no regional  wall motion abnormalities. There is severe asymmetric left ventricular  hypertrophy. Left ventricular diastolic  parameters are consistent with Grade II diastolic dysfunction  (pseudonormalization).  2. Right ventricular systolic function is normal. The right ventricular  size is normal. There is moderately elevated pulmonary artery systolic  pressure.  3. Left atrial size was moderately dilated.  4. The mitral valve is normal in structure. Moderate mitral valve  regurgitation. No evidence of mitral stenosis.  5. Tricuspid valve regurgitation is moderate.  6. The aortic valve is tricuspid. Aortic valve regurgitation is mild.  Mild aortic valve stenosis.   EKG:  EKG is not ordered today.    Recent Labs: 04/28/2019: BUN 25; Creatinine, Ser 1.62; Potassium 4.7; Sodium 138 12/10/2019: Hemoglobin 12.0  Recent Lipid Panel    Component Value Date/Time   CHOL 122 09/04/2016 1123   TRIG 59.0 09/04/2016 1123   TRIG 107 01/16/2006 1338   HDL 40.40 09/04/2016 1123   CHOLHDL 3 09/04/2016 1123   VLDL 11.8 09/04/2016 1123   LDLCALC 70 09/04/2016 1123    Risk Assessment/Calculations:     Physical Exam:    VS:  BP 110/70   Pulse 81   Ht 5\' 11"  (1.803 m)   Wt 155 lb (70.3 kg)   SpO2 99%   BMI 21.62 kg/m     Wt Readings from Last  3 Encounters:  12/23/19 155 lb (70.3 kg)  12/22/19 156 lb (70.8 kg)  10/21/19 155 lb (70.3 kg)     GEN:  Well nourished, well developed in no acute distress HEENT: Normal NECK: No JVD; No carotid bruits LYMPHATICS: No lymphadenopathy CARDIAC: RRR, 2/6 systolic murmur at the right upper sternal border RESPIRATORY:  Clear to auscultation without rales, wheezing or rhonchi  ABDOMEN: Soft, non-tender, non-distended MUSCULOSKELETAL:  No edema; No deformity  SKIN: Warm and dry NEUROLOGIC:  Alert and oriented x 3 PSYCHIATRIC:  Normal affect   ASSESSMENT:    1. Aortic valve stenosis, etiology of cardiac valve disease unspecified   2. Mitral valve insufficiency, unspecified etiology   3. Coronary artery disease involving native coronary artery of native heart without angina pectoris   4. Essential hypertension   5. Stage 3b chronic kidney disease ([place])    PLAN:    In order of problems listed above:  1. The patient has had mild to moderate aortic stenosis.  His exam remains consistent with this.  Ongoing surveillance is recommended.  I would like to see him back in 6 months with a follow-up echocardiogram. 2. Moderate mitral regurgitation has been noted.  Follow-up echocardiogram in 6 months.  No current indication for intervention at this point. 3. The patient is stable with no symptoms of angina.  He remains on aspirin for antiplatelet therapy. 4. Blood pressure well controlled based on my review of his home readings.  He is treated with amlodipine. 5. Renal function has been stable with most recent creatinine 1.8 mg/dL.  Overall the patient appears to be medically stable.  He is having problems with progressive leg weakness and discomfort.  He is really wanting to pursue back surgery and tells me that an MRI of his back is imperative.  Tough situation since he has a permanent pacemaker in place.  I will touch base with Dr. Rayann Heman so that he can review options with the  patient.  Medication Adjustments/Labs and Tests Ordered: Current medicines are reviewed at length with the patient today.  Concerns regarding medicines are outlined above.  Orders Placed This Encounter  Procedures  . ECHOCARDIOGRAM COMPLETE   No orders of the defined types were placed in this encounter.   Patient Instructions  Medication Instructions:  Your provider recommends that you continue on your current medications as directed. Please refer to the Current Medication list given to you today.   *If you need a refill on your cardiac medications before your next appointment, please call your pharmacy*   Follow-Up: You are scheduled for your 6 month echo and office visit with Dr. Burt Knack on 06/22/2020. Please arrive at 1:45PM for these appointments.    Signed, [name], MD  12/23/2019 3:14 PM    [facility]

## 2019-12-23 NOTE — Patient Instructions (Addendum)
Medication Instructions:  Your provider recommends that you continue on your current medications as directed. Please refer to the Current Medication list given to you today.   *If you need a refill on your cardiac medications before your next appointment, please call your pharmacy*   Follow-Up: You are scheduled for your 6 month echo and office visit with Dr. Burt Knack on 06/22/2020. Please arrive at 1:45PM for these appointments.

## 2019-12-24 ENCOUNTER — Encounter (HOSPITAL_COMMUNITY)
Admission: RE | Admit: 2019-12-24 | Discharge: 2019-12-24 | Disposition: A | Discharge status: Home / Self Care | Payer: Medicare Other | Source: Ambulatory Visit | Attending: Nephrology | Admitting: Nephrology

## 2019-12-24 ENCOUNTER — Other Ambulatory Visit: Payer: Self-pay

## 2019-12-24 VITALS — BP 136/74 | HR 79 | Resp 18

## 2019-12-24 DIAGNOSIS — N183 Chronic kidney disease, stage 3 unspecified: Secondary | ICD-10-CM

## 2019-12-24 LAB — POCT HEMOGLOBIN-HEMACUE: Hemoglobin: 11.5 g/dL — ABNORMAL LOW (ref 13.0–17.0)

## 2019-12-24 MED ORDER — EPOETIN ALFA-EPBX 10000 UNIT/ML IJ SOLN
INTRAMUSCULAR | Status: AC
  Administered 2019-12-24: 11:00:00 20000 [IU] via SUBCUTANEOUS
  Filled 2019-12-24: qty 2

## 2019-12-24 MED ORDER — EPOETIN ALFA-EPBX 10000 UNIT/ML IJ SOLN: 20000.0000 [IU] | INTRAMUSCULAR | Status: DC

## 2019-12-29 ENCOUNTER — Other Ambulatory Visit: Payer: Self-pay | Admitting: Orthopedic Surgery

## 2019-12-29 DIAGNOSIS — M545 Low back pain, unspecified: Secondary | ICD-10-CM

## 2020-01-05 ENCOUNTER — Other Ambulatory Visit: Payer: Self-pay | Admitting: Internal Medicine

## 2020-01-07 ENCOUNTER — Other Ambulatory Visit: Payer: Self-pay

## 2020-01-07 ENCOUNTER — Encounter (HOSPITAL_COMMUNITY)
Admission: RE | Admit: 2020-01-07 | Discharge: 2020-01-07 | Disposition: A | Discharge status: Home / Self Care | Payer: Medicare Other | Source: Ambulatory Visit | Attending: Nephrology | Admitting: Nephrology

## 2020-01-07 ENCOUNTER — Encounter (HOSPITAL_COMMUNITY): Payer: Medicare Other

## 2020-01-07 VITALS — BP 167/78 | HR 78 | Temp 97.6°F | Resp 18

## 2020-01-07 DIAGNOSIS — N183 Chronic kidney disease, stage 3 unspecified: Secondary | ICD-10-CM

## 2020-01-07 LAB — POCT HEMOGLOBIN-HEMACUE: Hemoglobin: 12 g/dL — ABNORMAL LOW (ref 13.0–17.0)

## 2020-01-07 MED ORDER — EPOETIN ALFA-EPBX 10000 UNIT/ML IJ SOLN: 20000.0000 [IU] | INTRAMUSCULAR | Status: DC

## 2020-01-11 ENCOUNTER — Other Ambulatory Visit: Payer: Self-pay

## 2020-01-11 ENCOUNTER — Ambulatory Visit (INDEPENDENT_AMBULATORY_CARE_PROVIDER_SITE_OTHER): Payer: Medicare Other

## 2020-01-11 DIAGNOSIS — E538 Deficiency of other specified B group vitamins: Secondary | ICD-10-CM

## 2020-01-11 NOTE — Progress Notes (Signed)
Pt here for monthly B12 injection per Dr Burns. B12 1000mcg given IM left deltoid and pt tolerated injection well.  Pt to schedule next B12 injection upon check out.  

## 2020-01-12 ENCOUNTER — Ambulatory Visit (INDEPENDENT_AMBULATORY_CARE_PROVIDER_SITE_OTHER): Payer: Medicare Other

## 2020-01-12 DIAGNOSIS — I442 Atrioventricular block, complete: Secondary | ICD-10-CM | POA: Diagnosis not present

## 2020-01-12 LAB — CUP PACEART REMOTE DEVICE CHECK
Battery Impedance: 703 Ohm
Battery Remaining Longevity: 81 mo
Battery Voltage: 2.78 V
Brady Statistic AP VP Percent: 0 %
Brady Statistic AP VS Percent: 31 %
Brady Statistic AS VP Percent: 0 %
Brady Statistic AS VS Percent: 68 %
Date Time Interrogation Session: 20220104112528
Implantable Lead Implant Date: 20140321
Implantable Lead Implant Date: 20140321
Implantable Lead Location: 753859
Implantable Lead Location: 753860
Implantable Lead Model: 5076
Implantable Lead Model: 5092
Implantable Pulse Generator Implant Date: 20140321
Lead Channel Impedance Value: 440 Ohm
Lead Channel Impedance Value: 753 Ohm
Lead Channel Pacing Threshold Amplitude: 0.75 V
Lead Channel Pacing Threshold Amplitude: 0.875 V
Lead Channel Pacing Threshold Pulse Width: 0.4 ms
Lead Channel Pacing Threshold Pulse Width: 0.4 ms
Lead Channel Setting Pacing Amplitude: 2 V
Lead Channel Setting Pacing Amplitude: 2.5 V
Lead Channel Setting Pacing Pulse Width: 0.4 ms
Lead Channel Setting Sensing Sensitivity: 5.6 mV

## 2020-01-14 NOTE — Progress Notes (Signed)
Spoke with [name] and informed him if an MRI is needed, it will need to be set up at [facility].  Will send to Dr. Susa Day as FYI for planning.

## 2020-01-14 NOTE — Progress Notes (Signed)
I spoke to [place] at [facility] and she states they will perform the MRI with this patients device.   Thanks, Marliss Czar

## 2020-01-19 ENCOUNTER — Ambulatory Visit
Admission: RE | Admit: 2020-01-19 | Discharge: 2020-01-19 | Disposition: A | Discharge status: Home / Self Care | Payer: Medicare Other | Source: Ambulatory Visit | Attending: Orthopedic Surgery | Admitting: Orthopedic Surgery

## 2020-01-19 ENCOUNTER — Telehealth: Payer: Self-pay | Admitting: Neurology

## 2020-01-19 ENCOUNTER — Other Ambulatory Visit: Payer: Self-pay

## 2020-01-19 DIAGNOSIS — M545 Low back pain, unspecified: Secondary | ICD-10-CM

## 2020-01-19 NOTE — Telephone Encounter (Signed)
Patient called and left a voice mail stating, "Since starting the nortriptyline 10mg , I've developed a 4"x4" rash that seems to be spreading. Do I see Dr. Tomi Likens for that? What do I do?"

## 2020-01-19 NOTE — Telephone Encounter (Signed)
Pt advise to D/C the Nortriptyline for now and to let us know when the rash clears so we can prescribe something different.  Pt states he schedule a visit with his Dermatologist for 01/20/19

## 2020-01-19 NOTE — Telephone Encounter (Signed)
He should discontinue the nortriptyline.  Once the rash clears, he should contact us and we can try a different medication for nerve pain.

## 2020-01-22 ENCOUNTER — Other Ambulatory Visit: Payer: Self-pay

## 2020-01-22 ENCOUNTER — Encounter (HOSPITAL_COMMUNITY)
Admission: RE | Admit: 2020-01-22 | Discharge: 2020-01-22 | Disposition: A | Discharge status: Home / Self Care | Payer: Medicare Other | Source: Ambulatory Visit | Attending: Nephrology | Admitting: Nephrology

## 2020-01-22 VITALS — BP 160/98 | HR 82 | Resp 18

## 2020-01-22 DIAGNOSIS — D631 Anemia in chronic kidney disease: Secondary | ICD-10-CM | POA: Diagnosis not present

## 2020-01-22 DIAGNOSIS — N183 Chronic kidney disease, stage 3 unspecified: Secondary | ICD-10-CM | POA: Diagnosis not present

## 2020-01-22 LAB — IRON AND TIBC
Iron: 70 ug/dL (ref 45–182)
Saturation Ratios: 28 % (ref 17.9–39.5)
TIBC: 253 ug/dL (ref 250–450)
UIBC: 183 ug/dL

## 2020-01-22 LAB — POCT HEMOGLOBIN-HEMACUE: Hemoglobin: 12.3 g/dL — ABNORMAL LOW (ref 13.0–17.0)

## 2020-01-22 LAB — FERRITIN: Ferritin: 185 ng/mL (ref 24–336)

## 2020-01-22 MED ORDER — EPOETIN ALFA-EPBX 10000 UNIT/ML IJ SOLN: 20000.0000 [IU] | INTRAMUSCULAR | Status: DC

## 2020-01-26 ENCOUNTER — Telehealth: Payer: Self-pay

## 2020-01-26 NOTE — Telephone Encounter (Signed)
Patients MRI has not been ordered per patient. Patient advised once his MRI is ordered, [facility] will then fax [place] paper work and we will complete it for his device.Verbalized understanding. Advised patient to call back with further questions or concerns.

## 2020-01-27 ENCOUNTER — Telehealth: Payer: Self-pay | Admitting: Neurology

## 2020-01-27 NOTE — Progress Notes (Signed)
Remote pacemaker transmission.   

## 2020-01-27 NOTE — Telephone Encounter (Signed)
Ok to restart nortriptyline 10mg  every night. Thanks

## 2020-01-27 NOTE — Telephone Encounter (Signed)
Patient states that he went to the dermatologist and was told that the rash was not from the medication  Nortriptyline it was something else. The patient would like to restart the medication and asked the Dermatologist about restarting it and was told to call our office and ask Dr Tomi Likens   Please call

## 2020-01-28 NOTE — Telephone Encounter (Signed)
Pt advised he can restart Nortriptyline 10 mg ad bedtime

## 2020-02-05 ENCOUNTER — Telehealth: Payer: Self-pay | Admitting: Internal Medicine

## 2020-02-05 ENCOUNTER — Other Ambulatory Visit: Payer: Self-pay

## 2020-02-05 ENCOUNTER — Encounter (HOSPITAL_COMMUNITY)
Admission: RE | Admit: 2020-02-05 | Discharge: 2020-02-05 | Disposition: A | Discharge status: Home / Self Care | Payer: Medicare Other | Source: Ambulatory Visit | Attending: Nephrology | Admitting: Nephrology

## 2020-02-05 VITALS — BP 142/77 | HR 83 | Temp 98.4°F | Resp 18

## 2020-02-05 DIAGNOSIS — N183 Chronic kidney disease, stage 3 unspecified: Secondary | ICD-10-CM | POA: Diagnosis not present

## 2020-02-05 LAB — POCT HEMOGLOBIN-HEMACUE: Hemoglobin: 11.9 g/dL — ABNORMAL LOW (ref 13.0–17.0)

## 2020-02-05 MED ORDER — EPOETIN ALFA-EPBX 10000 UNIT/ML IJ SOLN
INTRAMUSCULAR | Status: AC
  Administered 2020-02-05: 20000 [IU] via SUBCUTANEOUS
  Filled 2020-02-05: qty 2

## 2020-02-05 MED ORDER — EPOETIN ALFA-EPBX 10000 UNIT/ML IJ SOLN: 20000.0000 [IU] | INTRAMUSCULAR | Status: DC

## 2020-02-05 NOTE — Telephone Encounter (Signed)
Patient calling requesting to let Dr. Jackalyn Lombard nurse know he thanks you for your help relative to the MRI. Dr. Tonita Cong reviewed the last CAT scan and found that it was sufficient enough to have the surgery or the MRI taken. Treatment is planned with injections in the lower spine. Thank you very much. He states if she needs to call him back he will be available after 2:00 pm today.

## 2020-02-10 ENCOUNTER — Other Ambulatory Visit: Payer: Self-pay

## 2020-02-11 ENCOUNTER — Ambulatory Visit (INDEPENDENT_AMBULATORY_CARE_PROVIDER_SITE_OTHER): Payer: Medicare Other

## 2020-02-11 DIAGNOSIS — E538 Deficiency of other specified B group vitamins: Secondary | ICD-10-CM | POA: Diagnosis not present

## 2020-02-11 NOTE — Progress Notes (Addendum)
Pt here for monthly B12 injection per Dr Quay Burow.  B12 103mcg given IM left deltoid and pt tolerated injection well.  Pt to schedule next B12 injection upon check out.  Dr Alain [facility]: can you cosign since PCP is out of office?  Medical screening examination/treatment/procedure(s) were performed by non-physician practitioner and as supervising physician I was immediately available for consultation/collaboration.  I agree with above. Lew Dawes, MD

## 2020-02-17 ENCOUNTER — Ambulatory Visit (INDEPENDENT_AMBULATORY_CARE_PROVIDER_SITE_OTHER): Payer: Medicare Other

## 2020-02-17 ENCOUNTER — Ambulatory Visit (INDEPENDENT_AMBULATORY_CARE_PROVIDER_SITE_OTHER): Payer: Medicare Other | Admitting: Podiatry

## 2020-02-17 ENCOUNTER — Other Ambulatory Visit: Payer: Self-pay

## 2020-02-17 DIAGNOSIS — M7672 Peroneal tendinitis, left leg: Secondary | ICD-10-CM

## 2020-02-17 DIAGNOSIS — G609 Hereditary and idiopathic neuropathy, unspecified: Secondary | ICD-10-CM

## 2020-02-18 ENCOUNTER — Encounter: Payer: Self-pay | Admitting: Podiatry

## 2020-02-18 ENCOUNTER — Telehealth: Payer: Self-pay | Admitting: Podiatry

## 2020-02-18 NOTE — Telephone Encounter (Signed)
Patient left a voicemail on the nurse line stating that he is in a lot of pain even though he has the boot on it. He can hardly get around. He wanted to know if he could have some medication or a shot to relieve the pain. He said if you are going to prescribe him something for pain, he would like to contact is nephrologist about what you want to prescribe because he controls all his prescriptions due to his kidneys.

## 2020-02-18 NOTE — Progress Notes (Signed)
Subjective:  Patient ID: [name], male    DOB: [birth year],  MRN: [id number]  Chief Complaint  Patient presents with  . Foot Pain    Left foot pain. PT stated that his pain started yesterday and has only gotten worse he also has some swelling     87 y.o. male presents with the above complaint.  Patient presents with a new complaint of left lateral foot pain.  Patient states the pain started yesterday and has progressively gotten worse.  There is no swelling associated with it.  Patient has not tried any treatment options at home.  Patient tried some icing and resting but has not helped.  He is known to Dr. Cannon Kettle who had treated him for neuropathy.  She he would like to discuss treatment options for this.  He denies acute complaint.  His pain scale is 6 out of 10.  Is sharp shooting in nature.   Review of Systems: Negative except as noted in the HPI. Denies N/V/F/Ch.  Past Medical History:  Diagnosis Date  . Acute superficial venous thrombosis of lower extremity    a. RLE after CABG, neg dopp for DVT.  [name] Allergy   . Anemia   . Atrial fibrillation ([place])    a. Post-op from CABG, on amiodarone temporarily, d/c'd 12/2011  . CAD (coronary artery disease)    a. S/P stenting to mid RCA, prox PDA 06/1999. b. NSTEMI/CABG x 3 in 10/2011 with LIMA to LAD, SVG to PDA, and SVG to OM1.   . Chronic UTI    a. Followed by Dr. Risa Grill - colonized/asymptomatic - not on abx  . CKD (chronic kidney disease), stage III (HCC)    a. stable with a creatinine around 1.9-2.0 followed by nephrology.  . Dyslipidemia   . Echocardiogram    Echocardiogram 08/2018: EF 50-55, inf-lat AK, mild LVH, Gr 1 DD, RVSP 47, mild LAE, mild to mod MR, mild AI, mild AS (mean 11)  . GERD (gastroesophageal reflux disease)   . Hypertension    well-controlled.  . Myocardial infarction ([place])   . Neck injury    a. C3-C4 and C4-C5 foraminal narrowing, severe  . Prostate cancer ([place])    a. 2001 s/p TURP.  . Pulmonary nodule     a. felt to be noncancerous.  Status post followup CT scan 4 mm and stable.  . Renal artery stenosis ([place])    a. 50% by cath 2001  . 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  acetaminophen (TYLENOL) 500 MG tablet, Take 500 mg by mouth every 6 (six) hours as needed., Disp: , Rfl:  .  allopurinol (ZYLOPRIM) 100 MG tablet, TAKE 1 TABLET(100 MG) BY MOUTH DAILY, Disp: 90 tablet, Rfl: 1 .  amLODipine (NORVASC) 5 MG tablet, Take 1 tablet (5 mg total) by mouth daily., Disp: 90 tablet, Rfl: 3 .  Aromatic Inhalants (VICKS VAPOR INHALER IN), Place 1 puff into both nostrils as needed (for congestion)., Disp: , Rfl:  .  aspirin EC 81 MG tablet, Take 81 mg by mouth daily., Disp: , Rfl:  .  Calcium Citrate-Vitamin D (CITRACAL + D PO), Take by mouth. Per patient taking 1200 mg at night, Disp: , Rfl:  .  Carboxymeth-Glycerin-Polysorb (REFRESH OPTIVE MEGA-3 OP), Place 1 drop into both eyes 2 (two) times daily., Disp: , Rfl:  .  epoetin alfa-epbx (RETACRIT) 2000 UNIT/ML injection, 2,000 Units every 14 (fourteen) days.  Patient states he only gets it when he needs it but not every 2 weeks, Disp: , Rfl:  .  folic acid (FOLVITE) 1 MG tablet, Take 1 tablet (1 mg total) by mouth daily. Annual appt due in May must see provider for future refills, Disp: 90 tablet, Rfl: 0 .  furosemide (LASIX) 40 MG tablet, Take 1 tablet (40 mg total) by mouth as needed for fluid or edema., Disp: 90 tablet, Rfl: 3 .  leuprolide, 6 Month, (ELIGARD) 45 MG injection, Inject 45 mg into the skin every 6 (six) months., Disp: , Rfl:  .  loratadine (CLARITIN) 10 MG tablet, Take 10 mg by mouth daily as needed (for seasonal allergies)., Disp: , Rfl:  .  Multiple Vitamins-Minerals (PRESERVISION AREDS 2 PO), Take 1 capsule by mouth 2 (two) times daily. , Disp: , Rfl:  .  nitroGLYCERIN (NITROSTAT) 0.4 MG SL tablet, DISSOLVE 1 TABLET UNDER THE TONGUE EVERY 5 MINUTES AS NEEDED  FOR CHEST PAIN, MAXIMUM 3 TABLETS, Disp: 100 tablet, Rfl: 1 .  nortriptyline (PAMELOR) 10 MG capsule, Take 1 capsule (10 mg total) by mouth at bedtime., Disp: 30 capsule, Rfl: 5 .  predniSONE (DELTASONE) 10 MG tablet, 3 tabs po qd x 3 days, then 2 tabs po qd x 3 days, then 1 tab po qd x 3 days, Disp: 18 tablet, Rfl: 0 .  traZODone (DESYREL) 50 MG tablet, Take 0.5-1 tablets (25-50 mg total) by mouth at bedtime as needed for sleep., Disp: 30 tablet, Rfl: 3 .  triamcinolone cream (KENALOG) 0.1 %, Apply 1 application topically as needed (for itching- to affected sites). , Disp: , Rfl:  .  vitamin C (ASCORBIC ACID) 500 MG tablet, Take 1,000 mg by mouth daily. , Disp: , Rfl:  .  Vitamin D, Ergocalciferol, (DRISDOL) 1.25 MG (50000 UT) CAPS capsule, Take 50,000 Units by mouth every 30 (thirty) days. , Disp: , Rfl:  .  Zinc 50 MG CAPS, Take 50 mg by mouth at bedtime. , Disp: , Rfl:   Current Facility-Administered Medications:  .  cyanocobalamin ((VITAMIN B-12)) injection 1,000 mcg, 1,000 mcg, Intramuscular, Q30 days, [name], MD, 1,000 mcg at 02/11/20 1417 .  NON FORMULARY 1 application, 1 application, Topical, PRN, Landis Martins, DPM  Social History   Tobacco Use  Smoking Status Never Smoker  Smokeless Tobacco Never Used    Allergies  Allergen Reactions  . Aspirin Nausea And Vomiting and Other (See Comments)    Upset stomach- tolerates coated aspirin   . Lisinopril Anaphylaxis and Shortness Of Breath    After 3 tablets, he experienced trouble swallowing, throat tightness and hoarseness.   . Amoxicillin Nausea Only  . Atarax [Hydroxyzine Hcl] Nausea And Vomiting  . Cephalexin Diarrhea  . Ciprofloxacin Nausea Only  . Clindamycin Other (See Comments)    Reaction not recalled  . Clobetasol Other (See Comments)    Not effective  . Codeine Nausea Only and Other (See Comments)    Stomach upset  . Fish Allergy Nausea And Vomiting  . Fluarix [Influenza Virus Vaccine] Itching  .  Haemophilus Influenzae Itching  . Hydrocodone Nausea Only  . Hydrocodone-Acetaminophen Nausea And Vomiting  . Hydroxyzine Nausea And Vomiting  . Latex Itching and Other (See Comments)    (After flu shot)  . Macrobid [Nitrofurantoin Macrocrystal] Nausea Only  . Niacin Other (See Comments)    Unknown reaction  . Niacin-Lovastatin Er Other (See Comments)    Unsure of reaction. Taking simvastatin at home without problems  .  Niacin-Lovastatin Er Other (See Comments)    Reaction not recalled  . Nitrofurantoin Other (See Comments)    Upset stomach   . Omeprazole Other (See Comments)    Reaction not recalled- stopped by MD  . Other Nausea And Vomiting  . Vibramycin [Doxycycline] Other (See Comments)    Reaction not recalled  . Adhesive [Tape] Rash  . Bactrim [Sulfamethoxazole-Trimethoprim] Rash  . Colchicine Rash  . Gabapentin     Painful pimples on tongue  . Nortriptyline Rash   Objective:  There were no vitals filed for this visit. There is no height or weight on file to calculate BMI. Constitutional Well developed. Well nourished.  Vascular Dorsalis pedis pulses palpable bilaterally. Posterior tibial pulses palpable bilaterally. Capillary refill normal to all digits.  No cyanosis or clubbing noted. Pedal hair growth normal.  Neurologic Normal speech. Oriented to person, place, and time. Epicritic sensation to light touch grossly present bilaterally.  Dermatologic Nails well groomed and normal in appearance. No open wounds. No skin lesions.  Orthopedic:  Pain on palpation to the left lateral foot along the course of the peroneal tendon.  Pain at the insertion of the peroneal tendon.  No pain at the retrofibular region.  No pain at the Achilles tendon, no pain at the posterior tibial tendon or the ATFL ligament.  Pain with resisted dorsiflexion eversion of the foot   Radiographs: 3 views of skeletally mature adult left foot: No osseous abnormalities noted.  Mild ossicle noted  at the plantar aspect of the cuboid could likely be involvement with the peroneal tendon.  No other bony abnormalities identified. Assessment:   1. Peroneal tendinitis, left    Plan:  Patient was evaluated and treated and all questions answered.  Left peroneal tendinitis -I explained the patient the etiology of tendinitis and various treatment options were discussed.  Given the amount of pain he is having I believe he will benefit from cam boot immobilization.  He already has cam boot at home.  I have asked him to place himself back in the cam boot for next 4 weeks and if he is doing good I will have him transition to regular shoes.  Also discussed shoe gear modification with him in extensive detail he states understanding. -Place himself in the cam boot at home.  No follow-ups on file.

## 2020-02-19 ENCOUNTER — Encounter (HOSPITAL_COMMUNITY)
Admission: RE | Admit: 2020-02-19 | Discharge: 2020-02-19 | Disposition: A | Discharge status: Home / Self Care | Payer: Medicare Other | Source: Ambulatory Visit | Attending: Nephrology | Admitting: Nephrology

## 2020-02-19 ENCOUNTER — Other Ambulatory Visit: Payer: Self-pay

## 2020-02-19 VITALS — BP 152/74 | HR 84 | Temp 97.4°F | Resp 20

## 2020-02-19 DIAGNOSIS — N183 Chronic kidney disease, stage 3 unspecified: Secondary | ICD-10-CM | POA: Diagnosis present

## 2020-02-19 DIAGNOSIS — D631 Anemia in chronic kidney disease: Secondary | ICD-10-CM | POA: Diagnosis not present

## 2020-02-19 LAB — IRON AND TIBC
Iron: 60 ug/dL (ref 45–182)
Saturation Ratios: 23 % (ref 17.9–39.5)
TIBC: 263 ug/dL (ref 250–450)
UIBC: 203 ug/dL

## 2020-02-19 LAB — FERRITIN: Ferritin: 125 ng/mL (ref 24–336)

## 2020-02-19 LAB — POCT HEMOGLOBIN-HEMACUE: Hemoglobin: 12.2 g/dL — ABNORMAL LOW (ref 13.0–17.0)

## 2020-02-19 MED ORDER — EPOETIN ALFA-EPBX 10000 UNIT/ML IJ SOLN: 20000.0000 [IU] | INTRAMUSCULAR | Status: DC

## 2020-02-19 NOTE — Telephone Encounter (Signed)
Unfortunately he does not have  any medication options.

## 2020-02-19 NOTE — Telephone Encounter (Signed)
Tonika, Could you please call this patient back? Just in case he has further questions. Thank you!

## 2020-02-25 ENCOUNTER — Telehealth: Payer: Self-pay

## 2020-02-25 NOTE — Telephone Encounter (Signed)
He can wear some kind of compression socks to help with the swelling.  Essentially he can be nonweightbearing to the leg either with walker or knee scooter to take the pressure off of the tendon and will be the next best thing.

## 2020-02-25 NOTE — Telephone Encounter (Signed)
LVM for patient with the following information.

## 2020-02-25 NOTE — Telephone Encounter (Signed)
Pt was given a boot to wear last Friday. Pt states that he took the boot off on Monday due to pain and skin tearing from the boot. Pt states that his foot is still swollen and would like to know what to do next. Please advise.

## 2020-03-04 ENCOUNTER — Other Ambulatory Visit: Payer: Self-pay

## 2020-03-04 ENCOUNTER — Encounter (HOSPITAL_COMMUNITY)
Admission: RE | Admit: 2020-03-04 | Discharge: 2020-03-04 | Disposition: A | Discharge status: Home / Self Care | Payer: Medicare Other | Source: Ambulatory Visit | Attending: Nephrology | Admitting: Nephrology

## 2020-03-04 VITALS — BP 139/89 | HR 78 | Temp 97.7°F | Resp 20

## 2020-03-04 DIAGNOSIS — N183 Chronic kidney disease, stage 3 unspecified: Secondary | ICD-10-CM | POA: Diagnosis not present

## 2020-03-04 LAB — POCT HEMOGLOBIN-HEMACUE: Hemoglobin: 12 g/dL — ABNORMAL LOW (ref 13.0–17.0)

## 2020-03-04 MED ORDER — EPOETIN ALFA-EPBX 10000 UNIT/ML IJ SOLN: 20000.0000 [IU] | INTRAMUSCULAR | Status: DC

## 2020-03-09 ENCOUNTER — Other Ambulatory Visit: Payer: Self-pay

## 2020-03-10 ENCOUNTER — Ambulatory Visit (INDEPENDENT_AMBULATORY_CARE_PROVIDER_SITE_OTHER): Payer: Medicare Other

## 2020-03-10 DIAGNOSIS — E538 Deficiency of other specified B group vitamins: Secondary | ICD-10-CM

## 2020-03-10 MED ORDER — CYANOCOBALAMIN 1000 MCG/ML IJ SOLN: 1000.0000 ug | Freq: Once | INTRAMUSCULAR | Status: DC

## 2020-03-10 NOTE — Progress Notes (Addendum)
Patient seen in office today and B12 given under the orders of Dr. Billey Gosling.    b12 Injection given.   [name], MD

## 2020-03-14 ENCOUNTER — Ambulatory Visit (INDEPENDENT_AMBULATORY_CARE_PROVIDER_SITE_OTHER): Payer: Medicare Other | Admitting: Internal Medicine

## 2020-03-14 ENCOUNTER — Other Ambulatory Visit: Payer: Self-pay

## 2020-03-14 ENCOUNTER — Encounter: Payer: Self-pay | Admitting: Internal Medicine

## 2020-03-14 VITALS — BP 152/80 | HR 88 | Ht 71.0 in | Wt 158.2 lb

## 2020-03-14 DIAGNOSIS — I5033 Acute on chronic diastolic (congestive) heart failure: Secondary | ICD-10-CM

## 2020-03-14 DIAGNOSIS — I10 Essential (primary) hypertension: Secondary | ICD-10-CM

## 2020-03-14 DIAGNOSIS — I2510 Atherosclerotic heart disease of native coronary artery without angina pectoris: Secondary | ICD-10-CM

## 2020-03-14 DIAGNOSIS — I441 Atrioventricular block, second degree: Secondary | ICD-10-CM

## 2020-03-14 NOTE — Patient Instructions (Addendum)
Medication Instructions:  Your physician recommends that you continue on your current medications as directed. Please refer to the Current Medication list given to you today.  TAKE your lasix every morning for the next 3 days!  Labwork: None ordered.  Testing/Procedures: None ordered.  Follow-Up: Your physician wants you to follow-up in: one year with Tommye Standard PA.  You will receive a reminder letter in the mail two months in advance. If you don't receive a letter, please call our office to schedule the follow-up appointment.  Remote monitoring is used to monitor your Pacemaker from home. This monitoring reduces the number of office visits required to check your device to one time per year. It allows [place] to keep an eye on the functioning of your device to ensure it is working properly. You are scheduled for a device check from home on 04/12/2020. You may send your transmission at any time that day. If you have a wireless device, the transmission will be sent automatically. After your physician reviews your transmission, you will receive a postcard with your next transmission date.  Any Other Special Instructions Will Be Listed Below (If Applicable).  If you need a refill on your cardiac medications before your next appointment, please call your pharmacy.    [email or web address]">  DASH Eating Plan DASH stands for Dietary Approaches to Stop Hypertension. The DASH eating plan is a healthy eating plan that has been shown to:  Reduce high blood pressure (hypertension).  Reduce your risk for type 2 diabetes, heart disease, and stroke.  Help with weight loss. What are tips for following this plan? Reading food labels  Check food labels for the amount of salt (sodium) per serving. Choose foods with less than 5 percent of the Daily Value of sodium. Generally, foods with less than 300 milligrams (mg) of sodium per serving fit into this eating  plan.  To find whole grains, look for the word "whole" as the first word in the ingredient list. Shopping  Buy products labeled as "low-sodium" or "no salt added."  Buy fresh foods. Avoid canned foods and pre-made or frozen meals. Cooking  Avoid adding salt when cooking. Use salt-free seasonings or herbs instead of table salt or sea salt. Check with your health care provider or pharmacist before using salt substitutes.  Do not fry foods. Cook foods using healthy methods such as baking, boiling, grilling, roasting, and broiling instead.  Cook with heart-healthy oils, such as olive, canola, avocado, soybean, or sunflower oil. Meal planning  Eat a balanced diet that includes: ? 4 or more servings of fruits and 4 or more servings of vegetables each day. Try to fill one-half of your plate with fruits and vegetables. ? 6-8 servings of whole grains each day. ? Less than 6 oz (170 g) of lean meat, poultry, or fish each day. A 3-oz (85-g) serving of meat is about the same size as a deck of cards. One egg equals 1 oz (28 g). ? 2-3 servings of low-fat dairy each day. One serving is 1 cup (237 mL). ? 1 serving of nuts, seeds, or beans 5 times each week. ? 2-3 servings of heart-healthy fats. Healthy fats called omega-3 fatty acids are found in foods such as walnuts, flaxseeds, fortified milks, and eggs. These fats are also found in cold-water fish, such as sardines, salmon, and mackerel.  Limit how much you eat of: ? Canned or prepackaged foods. ? Food that is high in trans fat, such as some fried foods. ? Food  that is high in saturated fat, such as fatty meat. ? Desserts and other sweets, sugary drinks, and other foods with added sugar. ? Full-fat dairy products.  Do not salt foods before eating.  Do not eat more than 4 egg yolks a week.  Try to eat at least 2 vegetarian meals a week.  Eat more home-cooked food and less restaurant, buffet, and fast food.   Lifestyle  When eating at a  restaurant, ask that your food be prepared with less salt or no salt, if possible.  If you drink alcohol: ? Limit how much you use to:  0-1 drink a day for women who are not pregnant.  0-2 drinks a day for men. ? Be aware of how much alcohol is in your drink. In the [place] one drink equals one 12 oz bottle of beer (355 mL), one 5 oz glass of wine (148 mL), or one 1 oz glass of hard liquor (44 mL). General information  Avoid eating more than 2,300 mg of salt a day. If you have hypertension, you may need to reduce your sodium intake to 1,500 mg a day.  Work with your health care provider to maintain a healthy body weight or to lose weight. Ask what an ideal weight is for you.  Get at least 30 minutes of exercise that causes your heart to beat faster (aerobic exercise) most days of the week. Activities may include walking, swimming, or biking.  Work with your health care provider or dietitian to adjust your eating plan to your individual calorie needs. What foods should I eat? Fruits All fresh, dried, or frozen fruit. Canned fruit in natural juice (without added sugar). Vegetables Fresh or frozen vegetables (raw, steamed, roasted, or grilled). Low-sodium or reduced-sodium tomato and vegetable juice. Low-sodium or reduced-sodium tomato sauce and tomato paste. Low-sodium or reduced-sodium canned vegetables. Grains Whole-grain or whole-wheat bread. Whole-grain or whole-wheat pasta. Brown rice. [place]. Bulgur. Whole-grain and low-sodium cereals. Pita bread. Low-fat, low-sodium crackers. Whole-wheat flour tortillas. Meats and other proteins Skinless chicken or [place]. Ground chicken or [place]. Pork with fat trimmed off. Fish and seafood. Egg whites. Dried beans, peas, or lentils. Unsalted nuts, nut butters, and seeds. Unsalted canned beans. Lean cuts of beef with fat trimmed off. Low-sodium, lean precooked or cured meat, such as sausages or meat loaves. Dairy Low-fat (1%) or fat-free  (skim) milk. Reduced-fat, low-fat, or fat-free cheeses. Nonfat, low-sodium ricotta or cottage cheese. Low-fat or nonfat yogurt. Low-fat, low-sodium cheese. Fats and oils Soft margarine without trans fats. Vegetable oil. Reduced-fat, low-fat, or light mayonnaise and salad dressings (reduced-sodium). Canola, safflower, olive, avocado, soybean, and sunflower oils. Avocado. Seasonings and condiments Herbs. Spices. Seasoning mixes without salt. Other foods Unsalted popcorn and pretzels. Fat-free sweets. The items listed above may not be a complete list of foods and beverages you can eat. Contact a dietitian for more information. What foods should I avoid? Fruits Canned fruit in a light or heavy syrup. Fried fruit. Fruit in cream or butter sauce. Vegetables Creamed or fried vegetables. Vegetables in a cheese sauce. Regular canned vegetables (not low-sodium or reduced-sodium). Regular canned tomato sauce and paste (not low-sodium or reduced-sodium). Regular tomato and vegetable juice (not low-sodium or reduced-sodium). Angie Fava. Olives. Grains Baked goods made with fat, such as croissants, muffins, or some breads. Dry pasta or rice meal packs. Meats and other proteins Fatty cuts of meat. Ribs. Fried meat. Berniece Salines. Bologna, salami, and other precooked or cured meats, such as sausages or meat loaves. Fat  from the back of a pig (fatback). Bratwurst. Salted nuts and seeds. Canned beans with added salt. Canned or smoked fish. Whole eggs or egg yolks. Chicken or [place] with skin. Dairy Whole or 2% milk, cream, and half-and-half. Whole or full-fat cream cheese. Whole-fat or sweetened yogurt. Full-fat cheese. Nondairy creamers. Whipped toppings. Processed cheese and cheese spreads. Fats and oils Butter. Stick margarine. Lard. Shortening. Ghee. Bacon fat. Tropical oils, such as coconut, palm kernel, or palm oil. Seasonings and condiments Onion salt, garlic salt, seasoned salt, table salt, and sea salt.  Worcestershire sauce. Tartar sauce. Barbecue sauce. Teriyaki sauce. Soy sauce, including reduced-sodium. Steak sauce. Canned and packaged gravies. Fish sauce. Oyster sauce. Cocktail sauce. Store-bought horseradish. Ketchup. Mustard. Meat flavorings and tenderizers. Bouillon cubes. Hot sauces. Pre-made or packaged marinades. Pre-made or packaged taco seasonings. Relishes. Regular salad dressings. Other foods Salted popcorn and pretzels. The items listed above may not be a complete list of foods and beverages you should avoid. Contact a dietitian for more information. Where to find more information  National Heart, Lung, and Blood Institute: https://wilson-eaton.com/  American Heart Association: www.heart.org  Academy of Nutrition and Dietetics: www.eatright.[place]: www.kidney.org Summary  The DASH eating plan is a healthy eating plan that has been shown to reduce high blood pressure (hypertension). It may also reduce your risk for type 2 diabetes, heart disease, and stroke.  When on the DASH eating plan, aim to eat more fresh fruits and vegetables, whole grains, lean proteins, low-fat dairy, and heart-healthy fats.  With the DASH eating plan, you should limit salt (sodium) intake to 2,300 mg a day. If you have hypertension, you may need to reduce your sodium intake to 1,500 mg a day.  Work with your health care provider or dietitian to adjust your eating plan to your individual calorie needs. This information is not intended to replace advice given to you by your health care provider. Make sure you discuss any questions you have with your health care provider. Document Revised: 11/28/2018 Document Reviewed: 11/28/2018 Elsevier Patient Education  2021 [employer].

## 2020-03-14 NOTE — Progress Notes (Signed)
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 male who presents today for routine electrophysiology followup.  Since last being seen in our clinic, the patient reports doing very well.  His primary concern is with neuropathic leg pain.  He has mild edema.   + mild orthopnea with SOB in the mornings.   Today, he denies symptoms of palpitations, chest pain,  dizziness, presyncope, or syncope.  The patient is otherwise without complaint today.   Past Medical History:  Diagnosis Date  . Acute superficial venous thrombosis of lower extremity    a. RLE after CABG, neg dopp for DVT.  [name] Allergy   . Anemia   . Atrial fibrillation ([place])    a. Post-op from CABG, on amiodarone temporarily, d/c'd 12/2011  . CAD (coronary artery disease)    a. S/P stenting to mid RCA, prox PDA 06/1999. b. NSTEMI/CABG x 3 in 10/2011 with LIMA to LAD, SVG to PDA, and SVG to OM1.   . Chronic UTI    a. Followed by Dr. Risa Grill - colonized/asymptomatic - not on abx  . CKD (chronic kidney disease), stage III (HCC)    a. stable with a creatinine around 1.9-2.0 followed by nephrology.  . Dyslipidemia   . Echocardiogram    Echocardiogram 08/2018: EF 50-55, inf-lat AK, mild LVH, Gr 1 DD, RVSP 47, mild LAE, mild to mod MR, mild AI, mild AS (mean 11)  . GERD (gastroesophageal reflux disease)   . Hypertension    well-controlled.  . Myocardial infarction ([place])   . Neck injury    a. C3-C4 and C4-C5 foraminal narrowing, severe  . Prostate cancer ([place])    a. 2001 s/p TURP.  . Pulmonary nodule    a. felt to be noncancerous.  Status post followup CT scan 4 mm and stable.  . Renal artery stenosis ([place])    a. 50% by cath 2001  . 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 ANTERIOR CHAMBER WASHOUT Left 10/04/2018   Procedure: Anterior Chamber Washout, Vitreous Tap;  Surgeon: Jalene Mullet, MD;   Location: [facility];  Service: Ophthalmology;  Laterality: Left;  . cardia catherization  07-07-99  . CARDIAC SURGERY  10/18/12   open heart surgery  . CATARACT EXTRACTION W/ INTRAOCULAR LENS  IMPLANT, BILATERAL  3/205, 06/2013   [facility]  . COLONOSCOPY  04/12/07  . CORONARY ARTERY BYPASS GRAFT  10/19/2011   Procedure: CORONARY ARTERY BYPASS GRAFTING (CABG);  Surgeon: Gaye Pollack, MD;  Location: [facility];  Service: Open Heart Surgery;  Laterality: N/A;  times three using Left Internal Mammary Artery and Right Greater [place]hen[place] Vein Graft harvested Endoscopically  . edg  07-17-1994  . FLEXIBLE SIGMOIDOSCOPY  11-03-1997  . GAS INSERTION Left 10/04/2018   Procedure: Insertion Of Gas;  Surgeon: Jalene Mullet, MD;  Location: [place];  Service: Ophthalmology;  Laterality: Left;  [name] GAS/FLUID EXCHANGE Left 10/04/2018   Procedure: Gas/Fluid Exchange;  Surgeon: Jalene Mullet, MD;  Location: [facility];  Service: Ophthalmology;  Laterality: Left;  . LEFT HEART CATHETERIZATION WITH CORONARY ANGIOGRAM N/A 10/16/2011   Procedure: LEFT HEART CATHETERIZATION WITH CORONARY ANGIOGRAM;  Surgeon: Peter M Martinique, MD;  Location: [facility] CATH LAB;  Service: Cardiovascular;  Laterality: N/A;  . LEFT HEART CATHETERIZATION WITH CORONARY/GRAFT ANGIOGRAM N/A 03/11/2013   Procedure: LEFT HEART CATHETERIZATION WITH CORONARY/GRAFT ANGIOGRAM;  Surgeon: Blane Ohara, MD;  Location: [facility] CATH LAB;  Service: Cardiovascular;  Laterality: N/A;  . lumbar spinal disk and neck fusion surgery    . PACEMAKER INSERTION  03/28/12   PPM implanted for mobitz II AV block  . PARS PLANA VITRECTOMY Left 10/04/2018   Procedure: PARS PLANA VITRECTOMY 25 GAUGE FOR ENDOPHTHALMITIS WITH INJECTION OF INTRAVITREAL ANTIBIOTIC;  Surgeon: Jalene Mullet, MD;  Location: [facility];  Service: Ophthalmology;  Laterality: Left;  . peripheral vascular catherization  11-24-03  . PERMANENT PACEMAKER INSERTION N/A 03/28/2012   Procedure: PERMANENT PACEMAKER INSERTION;  Surgeon: Thompson Grayer, MD;  Location: [facility] C[facility] LAB;  Service: Cardiovascular;  Laterality: N/A;  . PROSTATECTOMY    . renal circulation  10-01-03  . s/p ptca    . stents     X 2  . stress cardiolite  05-04-05   spring 09-negative except for apical thinning, EF 68%    ROS- all systems are reviewed and negative except as per HPI above  Current Outpatient Medications  Medication Sig Dispense Refill  . acetaminophen (TYLENOL) 500 MG tablet Take 500 mg by mouth every 6 (six) hours as needed.    [name] allopurinol (ZYLOPRIM) 100 MG tablet TAKE 1 TABLET(100 MG) BY MOUTH DAILY 90 tablet 1  . Aromatic Inhalants (VICKS VAPOR INHALER IN) Place 1 puff into both nostrils as needed (for congestion).    [name] aspirin EC 81 MG tablet Take 81 mg by mouth daily.    . Calcium Citrate-Vitamin D (CITRACAL + D PO) Take 2 tablets by mouth in the morning and at bedtime.    . Carboxymeth-Glycerin-Polysorb (REFRESH OPTIVE MEGA-3 OP) Place 1 drop into both eyes 2 (two) times daily.    [name] epoetin alfa-epbx (RETACRIT) 2000 UNIT/ML injection 2,000 Units every 14 (fourteen) days. Patient states he only gets it when he needs it but not every 2 weeks    . folic acid (FOLVITE) 1 MG tablet Take 1 tablet (1 mg total) by mouth daily. Annual appt due in May must see provider for future refills 90 tablet 0  . leuprolide, 6 Month, (ELIGARD) 45 MG injection Inject 45 mg into the skin every 6 (six) months.    . loratadine (CLARITIN) 10 MG tablet Take 10 mg by mouth daily as needed (for seasonal allergies).    . Multiple Vitamins-Minerals (PRESERVISION AREDS 2 PO) Take 1 capsule by mouth 2 (two) times daily.     . nitroGLYCERIN (NITROSTAT) 0.4 MG SL tablet DISSOLVE 1 TABLET UNDER THE TONGUE EVERY 5 MINUTES AS NEEDED FOR CHEST PAIN, MAXIMUM 3 TABLETS 100 tablet 1  . nortriptyline (PAMELOR) 10 MG capsule Take 1 capsule (10 mg total) by mouth at bedtime. 30 capsule 5  . triamcinolone cream (KENALOG) 0.1 % Apply 1 application topically as needed (for itching- to  affected sites).     . vitamin C (ASCORBIC ACID) 500 MG tablet Take 1,000 mg by mouth daily.     . Vitamin D, Ergocalciferol, (DRISDOL) 1.25 MG (50000 UT) CAPS capsule Take 50,000 Units by mouth every 30 (thirty) days.     . Zinc 50 MG CAPS Take 50 mg by mouth at bedtime.     [name] amLODipine (NORVASC) 5 MG tablet Take 1 tablet (5 mg total) by mouth daily. 90 tablet 3  . furosemide (LASIX) 40 MG tablet Take 1 tablet (40 mg total) by mouth as needed for fluid or edema. 90 tablet 3   Current Facility-Administered Medications  Medication Dose Route Frequency Provider  Last Rate Last Admin  . cyanocobalamin ((VITAMIN B-12)) injection 1,000 mcg  1,000 mcg Intramuscular Q30 days [name], MD   1,000 mcg at 03/10/20 1423  . cyanocobalamin ((VITAMIN B-12)) injection 1,000 mcg  1,000 mcg Intramuscular Once Burns, Claudina Lick, MD      . NON FORMULARY 1 application  1 application Topical PRN Landis Martins, DPM        Physical Exam: Vitals:   03/14/20 1512  BP: (!) 152/80  Pulse: 88  SpO2: 99%  Weight: 158 lb 3.2 oz (71.8 kg)  Height: 5\' 11"  (1.803 m)    GEN- The patient is well appearing, alert and oriented x 3 today.   Head- normocephalic, atraumatic Eyes-  Sclera clear, conjunctiva pink Ears- hearing intact Oropharynx- clear Neck + JVD Lungs- few basilar rales, normal work of breathing Chest- pacemaker pocket is well healed Heart- Regular rate and rhythm, 2/6 SEM LUSB which is mid peaking GI- soft, NT, ND, + BS Extremities- no clubbing, cyanosis, +1 ankle edema  Pacemaker interrogation- reviewed in detail today,  See PACEART report  ekg tracing ordered today is personally reviewed and shows sinus with first degree AV block (PR 1360 msec)  Echo 07/07/19 reviewed  Assessment and Plan:  1. Symptomatic second degree heart block Normal pacemaker function See Pace Art report No changes today he is not device dependant today  2. HTN Stable No change required today  3. CAD No  ischemic symptoms  4. Acute on chronic diastolic dysfunction Sodium restriction advised Lasix prn  Risks, benefits and potential toxicities for medications prescribed and/or refilled reviewed with patient today.   Return to see EP PA in a year  Thompson Grayer MD, [facility] 03/14/2020 3:40 PM

## 2020-03-17 LAB — CUP PACEART INCLINIC DEVICE CHECK
Battery Impedance: 729 Ohm
Battery Remaining Longevity: 79 mo
Battery Voltage: 2.78 V
Brady Statistic AP VP Percent: 0 %
Brady Statistic AP VS Percent: 30 %
Brady Statistic AS VP Percent: 1 %
Brady Statistic AS VS Percent: 69 %
Date Time Interrogation Session: 20220307152000
Implantable Lead Implant Date: 20140321
Implantable Lead Implant Date: 20140321
Implantable Lead Location: 753859
Implantable Lead Location: 753860
Implantable Lead Model: 5076
Implantable Lead Model: 5092
Implantable Pulse Generator Implant Date: 20140321
Lead Channel Impedance Value: 418 Ohm
Lead Channel Impedance Value: 681 Ohm
Lead Channel Pacing Threshold Amplitude: 0.75 V
Lead Channel Pacing Threshold Amplitude: 1 V
Lead Channel Pacing Threshold Pulse Width: 0.4 ms
Lead Channel Pacing Threshold Pulse Width: 0.4 ms
Lead Channel Sensing Intrinsic Amplitude: 31.36 mV
Lead Channel Sensing Intrinsic Amplitude: 4 mV
Lead Channel Setting Pacing Amplitude: 2 V
Lead Channel Setting Pacing Amplitude: 2.5 V
Lead Channel Setting Pacing Pulse Width: 0.4 ms
Lead Channel Setting Sensing Sensitivity: 5.6 mV

## 2020-03-18 ENCOUNTER — Encounter (HOSPITAL_COMMUNITY)
Admission: RE | Admit: 2020-03-18 | Discharge: 2020-03-18 | Disposition: A | Discharge status: Home / Self Care | Payer: Medicare Other | Source: Ambulatory Visit | Attending: Nephrology | Admitting: Nephrology

## 2020-03-18 ENCOUNTER — Ambulatory Visit (INDEPENDENT_AMBULATORY_CARE_PROVIDER_SITE_OTHER): Payer: Medicare Other | Admitting: Podiatry

## 2020-03-18 ENCOUNTER — Other Ambulatory Visit: Payer: Self-pay

## 2020-03-18 VITALS — BP 132/76 | HR 78 | Resp 20

## 2020-03-18 DIAGNOSIS — D631 Anemia in chronic kidney disease: Secondary | ICD-10-CM | POA: Insufficient documentation

## 2020-03-18 DIAGNOSIS — L2084 Intrinsic (allergic) eczema: Secondary | ICD-10-CM | POA: Diagnosis not present

## 2020-03-18 DIAGNOSIS — N183 Chronic kidney disease, stage 3 unspecified: Secondary | ICD-10-CM | POA: Insufficient documentation

## 2020-03-18 LAB — IRON AND TIBC
Iron: 58 ug/dL (ref 45–182)
Saturation Ratios: 21 % (ref 17.9–39.5)
TIBC: 280 ug/dL (ref 250–450)
UIBC: 222 ug/dL

## 2020-03-18 LAB — POCT HEMOGLOBIN-HEMACUE: Hemoglobin: 11.9 g/dL — ABNORMAL LOW (ref 13.0–17.0)

## 2020-03-18 LAB — FERRITIN: Ferritin: 208 ng/mL (ref 24–336)

## 2020-03-18 MED ORDER — EPOETIN ALFA-EPBX 10000 UNIT/ML IJ SOLN: 20000.0000 [IU] | INTRAMUSCULAR | Status: DC

## 2020-03-18 MED ORDER — EPOETIN ALFA-EPBX 10000 UNIT/ML IJ SOLN
INTRAMUSCULAR | Status: AC
  Administered 2020-03-18: 20000 [IU] via SUBCUTANEOUS
  Filled 2020-03-18: qty 2

## 2020-03-18 MED ORDER — CLOTRIMAZOLE-BETAMETHASONE 1-0.05 % EX CREA: 1.0000 "application " | TOPICAL_CREAM | Freq: Two times a day (BID) | CUTANEOUS | 0 refills | Status: DC

## 2020-03-22 ENCOUNTER — Encounter: Payer: Self-pay | Admitting: Podiatry

## 2020-03-22 NOTE — Progress Notes (Signed)
Subjective:  Patient ID: [name], male    DOB: [birth year],  MRN: [id number]  Chief Complaint  Patient presents with  . Foot Pain    PT stated that he is doing okay he does still have some pain    87 y.o. male presents with the above complaint.  Patient presents with new complaint of dorsal leg as well as foot redness.  Patient states that it is itching and there is some redness that came out of nowhere.  Patient says been present for few weeks.  He does not recall what could have caused it.  He does have a lot of allergies.  He denies any other acute complaints he has not seen anyone else prior to see me for this particular issue.   Review of Systems: Negative except as noted in the HPI. Denies N/V/F/Ch.  Past Medical History:  Diagnosis Date  . Acute superficial venous thrombosis of lower extremity    a. RLE after CABG, neg dopp for DVT.  [name] Allergy   . Anemia   . Atrial fibrillation ([place])    a. Post-op from CABG, on amiodarone temporarily, d/c'd 12/2011  . CAD (coronary artery disease)    a. S/P stenting to mid RCA, prox PDA 06/1999. b. NSTEMI/CABG x 3 in 10/2011 with LIMA to LAD, SVG to PDA, and SVG to OM1.   . Chronic UTI    a. Followed by Dr. Risa Grill - colonized/asymptomatic - not on abx  . CKD (chronic kidney disease), stage III (HCC)    a. stable with a creatinine around 1.9-2.0 followed by nephrology.  . Dyslipidemia   . Echocardiogram    Echocardiogram 08/2018: EF 50-55, inf-lat AK, mild LVH, Gr 1 DD, RVSP 47, mild LAE, mild to mod MR, mild AI, mild AS (mean 11)  . GERD (gastroesophageal reflux disease)   . Hypertension    well-controlled.  . Myocardial infarction ([place])   . Neck injury    a. C3-C4 and C4-C5 foraminal narrowing, severe  . Prostate cancer ([place])    a. 2001 s/p TURP.  . Pulmonary nodule    a. felt to be noncancerous.  Status post followup CT scan 4 mm and stable.  . Renal artery stenosis ([place])    a. 50% by cath 2001  . 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  clotrimazole-betamethasone (LOTRISONE) cream, Apply 1 application topically 2 (two) times daily., Disp: 45 g, Rfl: 0 .  acetaminophen (TYLENOL) 500 MG tablet, Take 500 mg by mouth every 6 (six) hours as needed., Disp: , Rfl:  .  allopurinol (ZYLOPRIM) 100 MG tablet, TAKE 1 TABLET(100 MG) BY MOUTH DAILY, Disp: 90 tablet, Rfl: 1 .  amLODipine (NORVASC) 5 MG tablet, Take 1 tablet (5 mg total) by mouth daily., Disp: 90 tablet, Rfl: 3 .  Aromatic Inhalants (VICKS VAPOR INHALER IN), Place 1 puff into both nostrils as needed (for congestion)., Disp: , Rfl:  .  aspirin EC 81 MG tablet, Take 81 mg by mouth daily., Disp: , Rfl:  .  Calcium Citrate-Vitamin D (CITRACAL + D PO), Take 2 tablets by mouth in the morning and at bedtime., Disp: , Rfl:  .  Carboxymeth-Glycerin-Polysorb (REFRESH OPTIVE MEGA-3 OP), Place 1 drop into both eyes 2 (two) times daily., Disp: , Rfl:  .  epoetin alfa-epbx (RETACRIT) 2000 UNIT/ML injection, 2,000 Units every 14 (fourteen) days. Patient states he only gets it when he  needs it but not every 2 weeks, Disp: , Rfl:  .  folic acid (FOLVITE) 1 MG tablet, Take 1 tablet (1 mg total) by mouth daily. Annual appt due in May must see provider for future refills, Disp: 90 tablet, Rfl: 0 .  furosemide (LASIX) 40 MG tablet, Take 1 tablet (40 mg total) by mouth as needed for fluid or edema., Disp: 90 tablet, Rfl: 3 .  leuprolide, 6 Month, (ELIGARD) 45 MG injection, Inject 45 mg into the skin every 6 (six) months., Disp: , Rfl:  .  loratadine (CLARITIN) 10 MG tablet, Take 10 mg by mouth daily as needed (for seasonal allergies)., Disp: , Rfl:  .  Multiple Vitamins-Minerals (PRESERVISION AREDS 2 PO), Take 1 capsule by mouth 2 (two) times daily. , Disp: , Rfl:  .  nitroGLYCERIN (NITROSTAT) 0.4 MG SL tablet, DISSOLVE 1 TABLET UNDER THE TONGUE EVERY 5 MINUTES AS NEEDED FOR CHEST PAIN, MAXIMUM 3 TABLETS,  Disp: 100 tablet, Rfl: 1 .  nortriptyline (PAMELOR) 10 MG capsule, Take 1 capsule (10 mg total) by mouth at bedtime., Disp: 30 capsule, Rfl: 5 .  triamcinolone cream (KENALOG) 0.1 %, Apply 1 application topically as needed (for itching- to affected sites). , Disp: , Rfl:  .  vitamin C (ASCORBIC ACID) 500 MG tablet, Take 1,000 mg by mouth daily. , Disp: , Rfl:  .  Vitamin D, Ergocalciferol, (DRISDOL) 1.25 MG (50000 UT) CAPS capsule, Take 50,000 Units by mouth every 30 (thirty) days. , Disp: , Rfl:  .  Zinc 50 MG CAPS, Take 50 mg by mouth at bedtime. , Disp: , Rfl:   Current Facility-Administered Medications:  .  cyanocobalamin ((VITAMIN B-12)) injection 1,000 mcg, 1,000 mcg, Intramuscular, Q30 days, [name], MD, 1,000 mcg at 03/10/20 1423 .  cyanocobalamin ((VITAMIN B-12)) injection 1,000 mcg, 1,000 mcg, Intramuscular, Once, Burns, Claudina Lick, MD .  NON FORMULARY 1 application, 1 application, Topical, PRN, Landis Martins, DPM  Social History   Tobacco Use  Smoking Status Never Smoker  Smokeless Tobacco Never Used    Allergies  Allergen Reactions  . Aspirin Nausea And Vomiting and Other (See Comments)    Upset stomach- tolerates coated aspirin   . Lisinopril Anaphylaxis and Shortness Of Breath    After 3 tablets, he experienced trouble swallowing, throat tightness and hoarseness.   . Amoxicillin Nausea Only  . Atarax [Hydroxyzine Hcl] Nausea And Vomiting  . Cephalexin Diarrhea  . Ciprofloxacin Nausea Only  . Clindamycin Other (See Comments)    Reaction not recalled  . Clobetasol Other (See Comments)    Not effective  . Codeine Nausea Only and Other (See Comments)    Stomach upset  . Fish Allergy Nausea And Vomiting  . Fluarix [Influenza Virus Vaccine] Itching  . Haemophilus Influenzae Itching  . Hydrocodone Nausea Only  . Hydrocodone-Acetaminophen Nausea And Vomiting  . Hydroxyzine Nausea And Vomiting  . Latex Itching and Other (See Comments)    (After flu shot)  .  Macrobid [Nitrofurantoin Macrocrystal] Nausea Only  . Niacin Other (See Comments)    Unknown reaction  . Niacin-Lovastatin Er Other (See Comments)    Unsure of reaction. Taking simvastatin at home without problems  . Niacin-Lovastatin Er Other (See Comments)    Reaction not recalled  . Nitrofurantoin Other (See Comments)    Upset stomach   . Omeprazole Other (See Comments)    Reaction not recalled- stopped by MD  . Other Nausea And Vomiting  . Vibramycin [Doxycycline] Other (See Comments)  Reaction not recalled  . Adhesive [Tape] Rash  . Bactrim [Sulfamethoxazole-Trimethoprim] Rash  . Colchicine Rash  . Gabapentin     Painful pimples on tongue  . Nortriptyline Rash   Objective:  There were no vitals filed for this visit. There is no height or weight on file to calculate BMI. Constitutional Well developed. Well nourished.  Vascular Dorsalis pedis pulses palpable bilaterally. Posterior tibial pulses palpable bilaterally. Capillary refill normal to all digits.  No cyanosis or clubbing noted. Pedal hair growth normal.  Neurologic Normal speech. Oriented to person, place, and time. Epicritic sensation to light touch grossly present bilaterally.  Dermatologic  redness noted to the dorsal skin of the foot as well as the leg.  Subjective component of itching noted.  No raised papules noted.  No ulceration present.  Mild swelling noted to both legs.No concern for infection.  No cellulitis noted.  Orthopedic: Normal joint ROM without pain or crepitus bilaterally. No visible deformities. No bony tenderness.   Radiographs: None Assessment:   1. Intrinsic atopic dermatitis    Plan:  Patient was evaluated and treated and all questions answered.  Bilateral atopic dermatitis to the dorsal leg and arch -I explained the patient the etiology of dermatitis and various treatment options were extensively discussed.  Given the amount of inflammation that is present I believe patient may  benefit from Lotrisone cream.  He also has extensive amount of allergy that are present and he does not recall something that could he be allergic to. -Lotrisone cream was sent to the pharmacy I believe patient may benefit from application twice a day. -There may also be a component of fungal involvement given the itching associated with it as well.  No follow-ups on file.

## 2020-04-01 ENCOUNTER — Encounter (HOSPITAL_COMMUNITY)
Admission: RE | Admit: 2020-04-01 | Discharge: 2020-04-01 | Disposition: A | Discharge status: Home / Self Care | Payer: Medicare Other | Source: Ambulatory Visit | Attending: Nephrology | Admitting: Nephrology

## 2020-04-01 ENCOUNTER — Other Ambulatory Visit: Payer: Self-pay

## 2020-04-01 VITALS — BP 139/76 | HR 78 | Temp 97.7°F | Resp 20

## 2020-04-01 DIAGNOSIS — N183 Chronic kidney disease, stage 3 unspecified: Secondary | ICD-10-CM

## 2020-04-01 LAB — POCT HEMOGLOBIN-HEMACUE: Hemoglobin: 12.3 g/dL — ABNORMAL LOW (ref 13.0–17.0)

## 2020-04-01 MED ORDER — EPOETIN ALFA-EPBX 10000 UNIT/ML IJ SOLN: 20000.0000 [IU] | INTRAMUSCULAR | Status: DC

## 2020-04-07 NOTE — Progress Notes (Signed)
Subjective:    Patient ID: [name], male    DOB: Jul 20, 1926, 87 y.o.   MRN: [id number]  HPI The patient is here for follow up of their chronic medical problems   Rash - he has not had a biopsy done in the past for the rash on his legs.  Most recently he had a rash on his foot and leg.  podiatry prescribed lotrisone for intreinsic atopic dermatitis. He always has some pimple or rash on his feet or legs.    He takes his time getting out of bed.  After a step he feels like his knees and below have weakness and he feels like he can not move.  He is seeing Dr Nelva Bush and getting injections - they have not helped much.    neuropathy - podiatry has prescribed a compounded cream, which has not helped. His feet are very hypersensitive.  He is retaining fluid - he has needed to take lasix 4 days in a row.     His shins were shinning and one thought he had a rash.  He was advised to stop all medication on his feet.  He was only using a cream for itching - CerVe.    He has a pimple or small scab on his leg    SOB - he gets SOB with easy activities - like making the bed.  It is not consistent - sometimes he can walk further than others and he can get SOB.  He was in [place] in 1950 - he was in cold weather - for an extended period of time.  He had multiple jumps out of an airplane.   He gets muscle spasms in his legs intermittently. He can not stand long or bend over the sink.     Medications and allergies reviewed with patient and updated if appropriate.  Patient Active Problem List   Diagnosis Date Noted  . Numbness of left foot 10/21/2019  . S/P inguinal hernia repair 08/13/2019  . Left inguinal hernia 07/01/2019  . Weight loss 06/18/2018  . Myositis 06/18/2018  . Gout involving toe of left foot 05/12/2018  . Elevated CK 05/12/2018  . Myoclonic jerking 02/10/2018  . Chronic diastolic CHF (congestive heart failure) ([place]) 02/05/2018  . UGIB (upper gastrointestinal bleed)  02/01/2018  . Lumbar post-laminectomy syndrome 12/12/2017  . Pain of left hip joint 11/29/2017  . Trochanteric bursitis of left hip 11/29/2017  . Hyperpigmentation 11/04/2017  . Spinal stenosis of lumbar region 10/09/2017  . Abnormal appearance of skin 10/07/2017  . Frequent urination 09/03/2017  . Gouty arthritis of right great toe 09/03/2017  . Postnasal drip 08/06/2017  . Rash and nonspecific skin eruption 08/06/2017  . Moderate aortic regurgitation 07/04/2017  . Diastolic dysfunction 42/70/6237  . Edema 06/29/2017  . DOE (dyspnea on exertion) 06/29/2017  . Sinus symptom 06/05/2017  . Fatigue 05/18/2017  . Sleeping difficulty 05/18/2017  . Left foot pain 04/22/2017  . Trochanteric bursitis 01/10/2017  . Internal hemorrhoids 08/15/2016  . Dysphagia 02/06/2016  . Cephalalgia 07/14/2015  . Constipation 03/01/2015  . Myalgia 03/02/2014  . CHB (complete heart block) ([place]) 05/03/2012  . Postoperative atrial fibrillation ([place]) 05/03/2012  . Pacemaker 04/10/2012  . 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 Coronary atherosclerosis 11/27/2011  . Presence of aortocoronary bypass graft 10/24/2011  . Pulmonary nodule   . CAD (coronary artery disease)   . CKD (chronic kidney disease) stage 3, GFR 30-59 ml/min (HCC)   .  Venous insufficiency of leg 06/05/2010  . SHOULDER PAIN, RIGHT, CHRONIC 10/14/2009  . B12 deficiency 08/23/2009  . Peripheral neuropathy 03/21/2009  . DEGENERATIVE DISC DISEASE, CERVICAL SPINE, W/RADICULOPATHY 09/21/2008  . Dyslipidemia 01/17/2007  . Essential hypertension 07/20/2006  . Prostate cancer ([place]) 07/20/2006    Current Outpatient Medications on File Prior to Visit  Medication Sig Dispense Refill  . acetaminophen (TYLENOL) 500 MG tablet Take 500 mg by mouth every 6 (six) hours as needed.    [name] allopurinol (ZYLOPRIM) 100 MG tablet TAKE 1 TABLET(100 MG) BY MOUTH DAILY 90 tablet 1  . Aromatic Inhalants (VICKS VAPOR INHALER IN) Place 1 puff  into both nostrils as needed (for congestion).    [name] aspirin EC 81 MG tablet Take 81 mg by mouth daily.    . Calcium Citrate-Vitamin D (CITRACAL + D PO) Take 2 tablets by mouth in the morning and at bedtime.    . Carboxymeth-Glycerin-Polysorb (REFRESH OPTIVE MEGA-3 OP) Place 1 drop into both eyes 2 (two) times daily.    . clotrimazole-betamethasone (LOTRISONE) cream Apply 1 application topically 2 (two) times daily. 45 g 0  . epoetin alfa-epbx (RETACRIT) 2000 UNIT/ML injection 2,000 Units every 14 (fourteen) days. Patient states he only gets it when he needs it but not every 2 weeks    . folic acid (FOLVITE) 1 MG tablet Take 1 tablet (1 mg total) by mouth daily. Annual appt due in May must see provider for future refills 90 tablet 0  . leuprolide, 6 Month, (ELIGARD) 45 MG injection Inject 45 mg into the skin every 6 (six) months.    . loratadine (CLARITIN) 10 MG tablet Take 10 mg by mouth daily as needed (for seasonal allergies).    . nitroGLYCERIN (NITROSTAT) 0.4 MG SL tablet DISSOLVE 1 TABLET UNDER THE TONGUE EVERY 5 MINUTES AS NEEDED FOR CHEST PAIN, MAXIMUM 3 TABLETS 100 tablet 1  . nortriptyline (PAMELOR) 10 MG capsule Take 1 capsule (10 mg total) by mouth at bedtime. 30 capsule 5  . triamcinolone cream (KENALOG) 0.1 % Apply 1 application topically as needed (for itching- to affected sites).     . vitamin C (ASCORBIC ACID) 500 MG tablet Take 1,000 mg by mouth daily.     . Vitamin D, Ergocalciferol, (DRISDOL) 1.25 MG (50000 UT) CAPS capsule Take 50,000 Units by mouth every 30 (thirty) days.     . Zinc 50 MG CAPS Take 50 mg by mouth at bedtime.     [name] amLODipine (NORVASC) 5 MG tablet Take 1 tablet (5 mg total) by mouth daily. 90 tablet 3  . furosemide (LASIX) 40 MG tablet Take 1 tablet (40 mg total) by mouth as needed for fluid or edema. 90 tablet 3   Current Facility-Administered Medications on File Prior to Visit  Medication Dose Route Frequency Provider Last Rate Last Admin  . cyanocobalamin  ((VITAMIN B-12)) injection 1,000 mcg  1,000 mcg Intramuscular Q30 days [name], MD   1,000 mcg at 03/10/20 1423  . cyanocobalamin ((VITAMIN B-12)) injection 1,000 mcg  1,000 mcg Intramuscular Once Harriet Sutphen, Claudina Lick, MD      . NON FORMULARY 1 application  1 application Topical PRN Landis Martins, DPM        Past Medical History:  Diagnosis Date  . Acute superficial venous thrombosis of lower extremity    a. RLE after CABG, neg dopp for DVT.  [name] Allergy   . Anemia   . Atrial fibrillation ([place])    a. Post-op from CABG, on  amiodarone temporarily, d/c'd 12/2011  . CAD (coronary artery disease)    a. S/P stenting to mid RCA, prox PDA 06/1999. b. NSTEMI/CABG x 3 in 10/2011 with LIMA to LAD, SVG to PDA, and SVG to OM1.   . Chronic UTI    a. Followed by Dr. Risa Grill - colonized/asymptomatic - not on abx  . CKD (chronic kidney disease), stage III (HCC)    a. stable with a creatinine around 1.9-2.0 followed by nephrology.  . Dyslipidemia   . Echocardiogram    Echocardiogram 08/2018: EF 50-55, inf-lat AK, mild LVH, Gr 1 DD, RVSP 47, mild LAE, mild to mod MR, mild AI, mild AS (mean 11)  . GERD (gastroesophageal reflux disease)   . Hypertension    well-controlled.  . Myocardial infarction ([place])   . Neck injury    a. C3-C4 and C4-C5 foraminal narrowing, severe  . Prostate cancer ([place])    a. 2001 s/p TURP.  . Pulmonary nodule    a. felt to be noncancerous.  Status post followup CT scan 4 mm and stable.  . Renal artery stenosis ([place])    a. 50% by cath 2001  . 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 ANTERIOR CHAMBER WASHOUT Left 10/04/2018   Procedure: Anterior Chamber Washout, Vitreous Tap;  Surgeon: Jalene Mullet, MD;  Location: [place];  Service: Ophthalmology;  Laterality: Left;  . cardia catherization  07-07-99  . CARDIAC SURGERY  10/18/12   open heart surgery  . CATARACT EXTRACTION W/  INTRAOCULAR LENS  IMPLANT, BILATERAL  3/205, 06/2013   [facility]  . COLONOSCOPY  04/12/07  . CORONARY ARTERY BYPASS GRAFT  10/19/2011   Procedure: CORONARY ARTERY BYPASS GRAFTING (CABG);  Surgeon: Gaye Pollack, MD;  Location: [place];  Service: Open Heart Surgery;  Laterality: N/A;  times three using Left Internal Mammary Artery and Right Greater Saphenouse Vein Graft harvested Endoscopically  . edg  07-17-1994  . FLEXIBLE SIGMOIDOSCOPY  11-03-1997  . GAS INSERTION Left 10/04/2018   Procedure: Insertion Of Gas;  Surgeon: Jalene Mullet, MD;  Location: [place];  Service: Ophthalmology;  Laterality: Left;  [name] GAS/FLUID EXCHANGE Left 10/04/2018   Procedure: Gas/Fluid Exchange;  Surgeon: Jalene Mullet, MD;  Location: [place];  Service: Ophthalmology;  Laterality: Left;  . LEFT HEART CATHETERIZATION WITH CORONARY ANGIOGRAM N/A 10/16/2011   Procedure: LEFT HEART CATHETERIZATION WITH CORONARY ANGIOGRAM;  Surgeon: Peter M Martinique, MD;  [facility] CATH LAB;  Service: Cardiovascular;  Laterality: N/A;  . LEFT HEART CATHETERIZATION WITH CORONARY/GRAFT ANGIOGRAM N/A 03/11/2013   Procedure: LEFT HEART CATHETERIZATION WITH [name];  Surgeon: Blane Ohara, MD;  Location: [facility] CATH LAB;  Service: Cardiovascular;  Laterality: N/A;  . lumbar spinal disk and neck fusion surgery    . PACEMAKER INSERTION  03/28/12   PPM implanted for mobitz II AV block  . PARS PLANA VITRECTOMY Left 10/04/2018   Procedure: PARS PLANA VITRECTOMY 25 GAUGE FOR ENDOPHTHALMITIS WITH INJECTION OF INTRAVITREAL ANTIBIOTIC;  Surgeon: Jalene Mullet, MD;  Location: [place];  Service: Ophthalmology;  Laterality: Left;  . peripheral vascular catherization  11-24-03  . PERMANENT PACEMAKER INSERTION N/A 03/28/2012   Procedure: PERMANENT PACEMAKER INSERTION;  Surgeon: Thompson Grayer, MD;  Location: [facility] CATH LAB;  Service: Cardiovascular;  Laterality: N/A;  . PROSTATECTOMY    . renal circulation  10-01-03  . s/p ptca    . stents  X 2  .  stress cardiolite  05-04-05   spring 09-negative except for apical thinning, EF 68%    Social History   Socioeconomic History  . Marital status: Married    Spouse name: [name]  . Number of children: 2  . Years of education: Not on file  . Highest education level: Some college, no degree  Occupational History  . 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 Smoking status: Never Smoker  . Smokeless tobacco: Never Used  Vaping Use  . Vaping Use: Never used  Substance and Sexual Activity  . Alcohol use: Yes    Comment: Rarely  . Drug use: No  . Sexual activity: Not on file  Other Topics Concern  . Not on file  Social History Narrative   HSG, 1 year college.  married '52 - 3 years, divorced; married '56 - 3 years divorced; married '108-12 yrs - divorced; married '75 -. 1 son '57; 1 daughter - '53; 1 grandchild.  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  Very happily married.  End of life care: yes CPR, no long term mechanical ventilation, no heroic measures.right handed   Right handed   Social Determinants of Health   Financial Resource Strain: Not on file  Food Insecurity: Not on file  Transportation Needs: Not on file  Physical Activity: Not on file  Stress: Not on file  Social Connections: Not on file    Family History  Problem Relation Age of Onset  . Coronary artery disease Father        died @ 33  . Other Mother        cerebral hemorrhage - died @ 51  . Arthritis Mother   . Cancer Brother        Bladder  . Prostate cancer Brother   . Nephritis Brother        died @ age 94.  . Other Brother        cerebral hemorrhage - died @ 101  . Heart disease Brother   . Breast cancer Other        niece x 2  . Diabetes Neg Hx   . Colon cancer Neg Hx   . Adrenal disorder Neg Hx      Review of Systems  Constitutional: Negative for fever.  Respiratory: Positive for shortness of breath.   Skin: Positive for rash.  Neurological: Positive for weakness and numbness.       Objective:   Vitals:   04/08/20 1458  BP: 124/62  Pulse: 91  Temp: 98.4 F (36.9 C)  SpO2: 97%   BP Readings from Last 3 Encounters:  04/08/20 124/62  04/01/20 139/76  03/18/20 132/76   Wt Readings from Last 3 Encounters:  04/08/20 153 lb 9.6 oz (69.7 kg)  03/14/20 158 lb 3.2 oz (71.8 kg)  12/23/19 155 lb (70.3 kg)   Body mass index is 21.42 kg/m.  Depression screen [facility] 2/9 04/09/2020 03/24/2019  Decreased Interest 2 0  Down, Depressed, Hopeless 1 0  PHQ - 2 Score 3 0  Altered sleeping 3 -  Tired, decreased energy 2 -  Change in appetite 0 -  Feeling bad or failure about yourself  0 -  Trouble concentrating 1 -  Moving slowly or fidgety/restless  0 -  Suicidal thoughts 0 -  PHQ-9 Score 9 -  Difficult doing work/chores Somewhat difficult -  Some recent data might be hidden    GAD 7 : Generalized Anxiety Score 04/09/2020 10/22/2016  Nervous, Anxious, on Edge 1 0  Control/stop worrying 2 0  Worry too much - different things 2 1  Trouble relaxing 0 0  Restless 0 0  Easily annoyed or irritable 1 0  Afraid - awful might happen 1 0  Total GAD 7 Score 7 1  Anxiety Difficulty Somewhat difficult -       Physical Exam    Constitutional: Appears well-developed and well-nourished. No distress.  HENT:  Head: Normocephalic and atraumatic.  Neck: Neck supple. No tracheal deviation present. No thyromegaly present.  No cervical lymphadenopathy Cardiovascular: Normal rate, regular rhythm and normal heart sounds.  No edema Pulmonary/Chest: Effort normal and breath sounds normal. No respiratory distress. No has no wheezes. No rales.  Skin: Skin is warm and dry. Not diaphoretic. Erythematous rash b/l anterior legs, a few scattered papules that are scabbed from him scratching on lower  legs Psychiatric: Normal mood and affect. Behavior is normal.      Assessment & Plan:    I spent 20 minutes dedicated to the care of this patient on the date of this encounter including review of recent labs, imaging and procedures, speciality notes, obtaining history, communicating with the patient, ordering medications, tests, and documenting clinical information in the EHR   See Problem List for Assessment and Plan of chronic medical problems.    This visit occurred during the SARS-CoV-2 public health emergency.  Safety protocols were in place, including screening questions prior to the visit, additional usage of staff PPE, and extensive cleaning of exam room while observing appropriate contact time as indicated for disinfecting solutions.

## 2020-04-08 ENCOUNTER — Encounter (HOSPITAL_COMMUNITY): Payer: Medicare Other

## 2020-04-08 ENCOUNTER — Other Ambulatory Visit: Payer: Self-pay

## 2020-04-08 ENCOUNTER — Encounter: Payer: Self-pay | Admitting: Internal Medicine

## 2020-04-08 ENCOUNTER — Ambulatory Visit (INDEPENDENT_AMBULATORY_CARE_PROVIDER_SITE_OTHER): Payer: Medicare Other | Admitting: Internal Medicine

## 2020-04-08 VITALS — BP 124/62 | HR 91 | Temp 98.4°F | Ht 71.0 in | Wt 153.6 lb

## 2020-04-08 DIAGNOSIS — F419 Anxiety disorder, unspecified: Secondary | ICD-10-CM

## 2020-04-08 DIAGNOSIS — G6289 Other specified polyneuropathies: Secondary | ICD-10-CM | POA: Diagnosis not present

## 2020-04-08 DIAGNOSIS — R29898 Other symptoms and signs involving the musculoskeletal system: Secondary | ICD-10-CM | POA: Diagnosis not present

## 2020-04-08 DIAGNOSIS — R0602 Shortness of breath: Secondary | ICD-10-CM

## 2020-04-08 DIAGNOSIS — I2510 Atherosclerotic heart disease of native coronary artery without angina pectoris: Secondary | ICD-10-CM | POA: Diagnosis not present

## 2020-04-08 DIAGNOSIS — R21 Rash and other nonspecific skin eruption: Secondary | ICD-10-CM

## 2020-04-08 DIAGNOSIS — F32A Depression, unspecified: Secondary | ICD-10-CM

## 2020-04-08 NOTE — Patient Instructions (Addendum)
  See dermatology.   Lets see what the EMG - nerve conduction shows   Turn off the TV.  Increase your exercise.    A referral was ordered for pulmonary and neurology       Someone from their office will call you to schedule an appointment.     Please followup in 6 months, sooner if needed

## 2020-04-09 DIAGNOSIS — F419 Anxiety disorder, unspecified: Secondary | ICD-10-CM | POA: Insufficient documentation

## 2020-04-09 DIAGNOSIS — R29898 Other symptoms and signs involving the musculoskeletal system: Secondary | ICD-10-CM | POA: Insufficient documentation

## 2020-04-09 HISTORY — DX: Other symptoms and signs involving the musculoskeletal system: R29.898

## 2020-04-09 NOTE — Assessment & Plan Note (Signed)
Chronic Symptoms consistent with neuropathy EMG 2020 was normal Scheduled for another EMG this month by orthopedics He has tried gabapentin in the past Currently taking nortriptyline 10 mg daily

## 2020-04-09 NOTE — Assessment & Plan Note (Signed)
Chronic Has intermittent shortness of breath that does not seem to be consistent.  Typically occurs with activity-some days worse than others We will refer to pulmonary for further evaluation Has seen cardiology and not thought to be cardiac in nature

## 2020-04-09 NOTE — Assessment & Plan Note (Signed)
Chronic Has a fairly chronic rash on bilateral lower extremities and he has tried several topical treatments Unsure what the etiology is Advised that if he wants to have more definitive answers he needs to have this biopsied-he will return to dermatology to inquire about a biopsy

## 2020-04-09 NOTE — Assessment & Plan Note (Signed)
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

## 2020-04-09 NOTE — Assessment & Plan Note (Addendum)
Chronic Continues to have weakness in bilateral lower extremities Often he will stand up and feel like his legs are going to give way and he has to sit down ?  Orthopedic in nature-knees, lower back issues versus related to neuropathy Discussed to ask both orthopedics and neurology for better clarification-could be multifactorial Encouraged regular exercise, which he currently is not doing  Already seen orthopedics Will refer to neurology-he would like a fresh look down things

## 2020-04-12 ENCOUNTER — Encounter: Payer: Self-pay | Admitting: Internal Medicine

## 2020-04-12 ENCOUNTER — Ambulatory Visit (INDEPENDENT_AMBULATORY_CARE_PROVIDER_SITE_OTHER): Payer: Medicare Other

## 2020-04-12 DIAGNOSIS — I442 Atrioventricular block, complete: Secondary | ICD-10-CM | POA: Diagnosis not present

## 2020-04-13 LAB — CUP PACEART REMOTE DEVICE CHECK
Battery Impedance: 782 Ohm
Battery Remaining Longevity: 79 mo
Battery Voltage: 2.78 V
Brady Statistic AP VP Percent: 0 %
Brady Statistic AP VS Percent: 11 %
Brady Statistic AS VP Percent: 1 %
Brady Statistic AS VS Percent: 88 %
Date Time Interrogation Session: 20220405083931
Implantable Lead Implant Date: 20140321
Implantable Lead Implant Date: 20140321
Implantable Lead Location: 753859
Implantable Lead Location: 753860
Implantable Lead Model: 5076
Implantable Lead Model: 5092
Implantable Pulse Generator Implant Date: 20140321
Lead Channel Impedance Value: 435 Ohm
Lead Channel Impedance Value: 750 Ohm
Lead Channel Pacing Threshold Amplitude: 0.75 V
Lead Channel Pacing Threshold Amplitude: 0.875 V
Lead Channel Pacing Threshold Pulse Width: 0.4 ms
Lead Channel Pacing Threshold Pulse Width: 0.4 ms
Lead Channel Setting Pacing Amplitude: 2 V
Lead Channel Setting Pacing Amplitude: 2.5 V
Lead Channel Setting Pacing Pulse Width: 0.4 ms
Lead Channel Setting Sensing Sensitivity: 5.6 mV

## 2020-04-15 ENCOUNTER — Other Ambulatory Visit: Payer: Self-pay

## 2020-04-15 ENCOUNTER — Telehealth: Payer: Self-pay | Admitting: Oncology

## 2020-04-15 ENCOUNTER — Ambulatory Visit (INDEPENDENT_AMBULATORY_CARE_PROVIDER_SITE_OTHER): Payer: Medicare Other

## 2020-04-15 ENCOUNTER — Encounter (HOSPITAL_COMMUNITY)
Admission: RE | Admit: 2020-04-15 | Discharge: 2020-04-15 | Disposition: A | Discharge status: Home / Self Care | Payer: Medicare Other | Source: Ambulatory Visit | Attending: Nephrology | Admitting: Nephrology

## 2020-04-15 VITALS — BP 154/86 | HR 63 | Temp 97.4°F | Resp 20

## 2020-04-15 DIAGNOSIS — N183 Chronic kidney disease, stage 3 unspecified: Secondary | ICD-10-CM | POA: Insufficient documentation

## 2020-04-15 DIAGNOSIS — E538 Deficiency of other specified B group vitamins: Secondary | ICD-10-CM | POA: Diagnosis not present

## 2020-04-15 DIAGNOSIS — D631 Anemia in chronic kidney disease: Secondary | ICD-10-CM | POA: Diagnosis not present

## 2020-04-15 LAB — POCT HEMOGLOBIN-HEMACUE: Hemoglobin: 12.6 g/dL — ABNORMAL LOW (ref 13.0–17.0)

## 2020-04-15 LAB — IRON AND TIBC
Iron: 72 ug/dL (ref 45–182)
Saturation Ratios: 27 % (ref 17.9–39.5)
TIBC: 272 ug/dL (ref 250–450)
UIBC: 200 ug/dL

## 2020-04-15 LAB — FERRITIN: Ferritin: 159 ng/mL (ref 24–336)

## 2020-04-15 MED ORDER — EPOETIN ALFA-EPBX 10000 UNIT/ML IJ SOLN: 20000.0000 [IU] | INTRAMUSCULAR | Status: DC

## 2020-04-15 NOTE — Telephone Encounter (Signed)
I received a new pt referral from Dr. Lovena Neighbours for Assess lung nodule noted on recent CT.  Hx of prostate cancer. [name] returned my call and has been scheduled to see Dr. Alen Blew on 4/14 at 11am. Pt aware to arrive 20 minutes early.

## 2020-04-15 NOTE — Progress Notes (Signed)
Pt here for monthly B12 injection per Dr. Quay Burow  B12 1076mcg given IM left arm, and pt tolerated injection well.  Next B12 injection scheduled for 05/18/20

## 2020-04-21 ENCOUNTER — Other Ambulatory Visit: Payer: Self-pay

## 2020-04-21 ENCOUNTER — Inpatient Hospital Stay: Payer: Medicare Other | Attending: Oncology | Admitting: Oncology

## 2020-04-21 VITALS — BP 127/72 | HR 85 | Temp 97.6°F | Resp 17 | Ht 71.0 in | Wt 159.5 lb

## 2020-04-21 DIAGNOSIS — R911 Solitary pulmonary nodule: Secondary | ICD-10-CM | POA: Diagnosis not present

## 2020-04-21 DIAGNOSIS — C61 Malignant neoplasm of prostate: Secondary | ICD-10-CM | POA: Diagnosis not present

## 2020-04-21 NOTE — Progress Notes (Signed)
Reason for the request: Prostate cancer     HPI: I was asked by Dr. Lovena Neighbours to evaluate [name] for the evaluation of prostate cancer.  Is a 87 year old man with a history of coronary artery disease, chronic renal insufficiency and history of prostate cancer.  He was diagnosed with prostate cancer in the year 2010 underwent radical prostatectomy in the year 2000.  He has subsequently developed a biochemical relapse and started receiving ADT under the care of Dr. Lovena Neighbours in November 2021.  His PSA in November 2021 was 1.27 previously it was 4.3 in October 2021 and in 2019 was 3.74.  He underwent imaging studies in October 2021 which showed to enlarging soft tissue nodules along the right surgical bed measuring 2.3 cm and is mildly progressive from 2019.  His bone scan at that time was negative.  CT scan obtained on April of the first 2022 which showed a an enlarging nodularity of the right lower lobe of the lung that is worrisome for metastatic disease measuring 14 mm.  The nodularity around the prostatectomy bed has decreased compared to imaging studies in October 2021.  No evidence of metastatic disease otherwise noted.  His next Eligard injection will be in May 2022.  Clinically, he he reports no specific complaints related to his prostate cancer.  He does report arthritic joint pain and stiffness.  He does ambulate with the help of cane without any falls or syncope.  He does not report any headaches, blurry vision, syncope or seizures. Does not report any fevers, chills or sweats.  Does not report any cough, wheezing or hemoptysis.  Does not report any chest pain, palpitation, orthopnea or leg edema.  Does not report any nausea, vomiting or abdominal pain.  Does not report any constipation or diarrhea.  Does not report any skeletal complaints.    Does not report frequency, urgency or hematuria.  Does not report any skin rashes or lesions. Does not report any heat or cold intolerance.  Does not report  any lymphadenopathy or petechiae.  Does not report any anxiety or depression.  Remaining review of systems is negative.    Past Medical History:  Diagnosis Date  . Acute superficial venous thrombosis of lower extremity    a. RLE after CABG, neg dopp for DVT.  [name] Allergy   . Anemia   . Atrial fibrillation ([place])    a. Post-op from CABG, on amiodarone temporarily, d/c'd 12/2011  . CAD (coronary artery disease)    a. S/P stenting to mid RCA, prox PDA 06/1999. b. NSTEMI/CABG x 3 in 10/2011 with LIMA to LAD, SVG to PDA, and SVG to OM1.   . Chronic UTI    a. Followed by Dr. Risa Grill - colonized/asymptomatic - not on abx  . CKD (chronic kidney disease), stage III (HCC)    a. stable with a creatinine around 1.9-2.0 followed by nephrology.  . Dyslipidemia   . Echocardiogram    Echocardiogram 08/2018: EF 50-55, inf-lat AK, mild LVH, Gr 1 DD, RVSP 47, mild LAE, mild to mod MR, mild AI, mild AS (mean 11)  . GERD (gastroesophageal reflux disease)   . Hypertension    well-controlled.  . Myocardial infarction ([place])   . Neck injury    a. C3-C4 and C4-C5 foraminal narrowing, severe  . Prostate cancer ([name])    a. 2001 s/p TURP.  . Pulmonary nodule    a. felt to be noncancerous.  Status post followup CT scan 4 mm and stable.  . Renal  artery stenosis ([place])    a. 50% by cath 2001  . 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 ANTERIOR CHAMBER WASHOUT Left 10/04/2018   Procedure: Anterior Chamber Washout, Vitreous Tap;  Surgeon: Jalene Mullet, MD;  Location: [place];  Service: Ophthalmology;  Laterality: Left;  . cardia catherization  07-07-99  . CARDIAC SURGERY  10/18/12   open heart surgery  . CATARACT EXTRACTION W/ INTRAOCULAR LENS  IMPLANT, BILATERAL  3/205, 06/2013   [facility]  . COLONOSCOPY  04/12/07  . CORONARY ARTERY BYPASS GRAFT  10/19/2011   Procedure: CORONARY ARTERY BYPASS GRAFTING (CABG);  Surgeon:  Gaye Pollack, MD;  Location: [place];  Service: Open Heart Surgery;  Laterality: N/A;  times three using Left Internal Mammary Artery and Right Greater Saphenouse Vein Graft harvested Endoscopically  . edg  07-17-1994  . FLEXIBLE SIGMOIDOSCOPY  11-03-1997  . GAS INSERTION Left 10/04/2018   Procedure: Insertion Of Gas;  Surgeon: Jalene Mullet, MD;  Location: [facility];  Service: Ophthalmology;  Laterality: Left;  [name] GAS/FLUID EXCHANGE Left 10/04/2018   Procedure: Gas/Fluid Exchange;  Surgeon: Jalene Mullet, MD;  Location: [place] [facility];  Service: Ophthalmology;  Laterality: Left;  . LEFT HEART CATHETERIZATION WITH CORONARY ANGIOGRAM N/A 10/16/2011   Procedure: LEFT HEART CATHETERIZATION WITH CORONARY ANGIOGRAM;  Surgeon: Peter M Martinique, MD;  Location: [facility] CATH LAB;  Service: Cardiovascular;  Laterality: N/A;  . LEFT HEART CATHETERIZATION WITH CORONARY/GRAFT ANGIOGRAM N/A 03/11/2013   Procedure: LEFT HEART CATHETERIZATION WITH [name];  Surgeon: Blane Ohara, MD;  Location: [facility] CATH LAB;  Service: Cardiovascular;  Laterality: N/A;  . lumbar spinal disk and neck fusion surgery    . PACEMAKER INSERTION  03/28/12   PPM implanted for mobitz II AV block  . PARS PLANA VITRECTOMY Left 10/04/2018   Procedure: PARS PLANA VITRECTOMY 25 GAUGE FOR ENDOPHTHALMITIS WITH INJECTION OF INTRAVITREAL ANTIBIOTIC;  Surgeon: Jalene Mullet, MD;  Location: [place];  Service: Ophthalmology;  Laterality: Left;  . peripheral vascular catherization  11-24-03  . PERMANENT PACEMAKER INSERTION N/A 03/28/2012   Procedure: PERMANENT PACEMAKER INSERTION;  Surgeon: Thompson Grayer, MD;  Location: [facility] CATH LAB;  Service: Cardiovascular;  Laterality: N/A;  . PROSTATECTOMY    . renal circulation  10-01-03  . s/p ptca    . stents     X 2  . stress cardiolite  05-04-05   spring 09-negative except for apical thinning, EF 68%  :   Current Outpatient Medications:  .  acetaminophen (TYLENOL) 500 MG tablet, Take 500 mg by mouth every 6  (six) hours as needed., Disp: , Rfl:  .  allopurinol (ZYLOPRIM) 100 MG tablet, TAKE 1 TABLET(100 MG) BY MOUTH DAILY, Disp: 90 tablet, Rfl: 1 .  amLODipine (NORVASC) 5 MG tablet, Take 1 tablet (5 mg total) by mouth daily., Disp: 90 tablet, Rfl: 3 .  Aromatic Inhalants (VICKS VAPOR INHALER IN), Place 1 puff into both nostrils as needed (for congestion)., Disp: , Rfl:  .  aspirin EC 81 MG tablet, Take 81 mg by mouth daily., Disp: , Rfl:  .  Calcium Citrate-Vitamin D (CITRACAL + D PO), Take 2 tablets by mouth in the morning and at bedtime., Disp: , Rfl:  .  Carboxymeth-Glycerin-Polysorb (REFRESH OPTIVE MEGA-3 OP), Place 1 drop into both eyes 2 (two) times daily., Disp: , Rfl:  .  clotrimazole-betamethasone (LOTRISONE) cream, Apply 1 application topically 2 (two) times daily.,  Disp: 45 g, Rfl: 0 .  epoetin alfa-epbx (RETACRIT) 2000 UNIT/ML injection, 2,000 Units every 14 (fourteen) days. Patient states he only gets it when he needs it but not every 2 weeks, Disp: , Rfl:  .  folic acid (FOLVITE) 1 MG tablet, Take 1 tablet (1 mg total) by mouth daily. Annual appt due in May must see provider for future refills, Disp: 90 tablet, Rfl: 0 .  furosemide (LASIX) 40 MG tablet, Take 1 tablet (40 mg total) by mouth as needed for fluid or edema., Disp: 90 tablet, Rfl: 3 .  leuprolide, 6 Month, (ELIGARD) 45 MG injection, Inject 45 mg into the skin every 6 (six) months., Disp: , Rfl:  .  loratadine (CLARITIN) 10 MG tablet, Take 10 mg by mouth daily as needed (for seasonal allergies)., Disp: , Rfl:  .  nitroGLYCERIN (NITROSTAT) 0.4 MG SL tablet, DISSOLVE 1 TABLET UNDER THE TONGUE EVERY 5 MINUTES AS NEEDED FOR CHEST PAIN, MAXIMUM 3 TABLETS, Disp: 100 tablet, Rfl: 1 .  nortriptyline (PAMELOR) 10 MG capsule, Take 1 capsule (10 mg total) by mouth at bedtime., Disp: 30 capsule, Rfl: 5 .  triamcinolone cream (KENALOG) 0.1 %, Apply 1 application topically as needed (for itching- to affected sites). , Disp: , Rfl:  .  vitamin C  (ASCORBIC ACID) 500 MG tablet, Take 1,000 mg by mouth daily. , Disp: , Rfl:  .  Vitamin D, Ergocalciferol, (DRISDOL) 1.25 MG (50000 UT) CAPS capsule, Take 50,000 Units by mouth every 30 (thirty) days. , Disp: , Rfl:  .  Zinc 50 MG CAPS, Take 50 mg by mouth at bedtime. , Disp: , Rfl:   Current Facility-Administered Medications:  .  cyanocobalamin ((VITAMIN B-12)) injection 1,000 mcg, 1,000 mcg, Intramuscular, Q30 days, [name], MD, 1,000 mcg at 04/15/20 1426 .  cyanocobalamin ((VITAMIN B-12)) injection 1,000 mcg, 1,000 mcg, Intramuscular, Once, Burns, Claudina Lick, MD .  NON FORMULARY 1 application, 1 application, Topical, PRN, Landis Martins, DPM:  Allergies  Allergen Reactions  . Aspirin Nausea And Vomiting and Other (See Comments)    Upset stomach- tolerates coated aspirin   . Lisinopril Anaphylaxis and Shortness Of Breath    After 3 tablets, he experienced trouble swallowing, throat tightness and hoarseness.   . Amoxicillin Nausea Only  . Atarax [Hydroxyzine Hcl] Nausea And Vomiting  . Cephalexin Diarrhea  . Ciprofloxacin Nausea Only  . Clindamycin Other (See Comments)    Reaction not recalled  . Clobetasol Other (See Comments)    Not effective  . Codeine Nausea Only and Other (See Comments)    Stomach upset  . Fish Allergy Nausea And Vomiting  . Fluarix [Influenza Virus Vaccine] Itching  . Haemophilus Influenzae Itching  . Hydrocodone Nausea Only  . Hydrocodone-Acetaminophen Nausea And Vomiting  . Hydroxyzine Nausea And Vomiting  . Latex Itching and Other (See Comments)    (After flu shot)  . Macrobid [Nitrofurantoin Macrocrystal] Nausea Only  . Niacin Other (See Comments)    Unknown reaction  . Niacin-Lovastatin Er Other (See Comments)    Unsure of reaction. Taking simvastatin at home without problems  . Niacin-Lovastatin Er Other (See Comments)    Reaction not recalled  . Nitrofurantoin Other (See Comments)    Upset stomach   . Omeprazole Other (See Comments)     Reaction not recalled- stopped by MD  . Other Nausea And Vomiting  . Vibramycin [Doxycycline] Other (See Comments)    Reaction not recalled  . Adhesive [Tape] Rash  . Bactrim [Sulfamethoxazole-Trimethoprim]  Rash  . Colchicine Rash  . Gabapentin     Painful pimples on tongue  . Nortriptyline Rash  :  Family History  Problem Relation Age of Onset  . Coronary artery disease Father        died @ 92  . Other Mother        cerebral hemorrhage - died @ 20  . Arthritis Mother   . Cancer Brother        Bladder  . Prostate cancer Brother   . Nephritis Brother        died @ age 19.  . Other Brother        cerebral hemorrhage - died @ 88  . Heart disease Brother   . Breast cancer Other        niece x 2  . Diabetes Neg Hx   . Colon cancer Neg Hx   . Adrenal disorder Neg Hx   :  Social History   Socioeconomic History  . Marital status: Married    Spouse name: [name]  . Number of children: 2  . Years of education: Not on file  . Highest education level: Some college, no degree  Occupational History  . 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 Smoking status: Never Smoker  . Smokeless tobacco: Never Used  Vaping Use  . Vaping Use: Never used  Substance and Sexual Activity  . Alcohol use: Yes    Comment: Rarely  . Drug use: No  . Sexual activity: Not on file  Other Topics Concern  . Not on file  Social History Narrative   HSG, 1 year college.  married '52 - 3 years, divorced; married '56 - 3 years divorced; married '44-12 yrs - divorced; married '75 -. 1 son '57; 1 daughter - '53; 1 grandchild.  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  Very happily married.  End of life care: yes CPR, no long term mechanical ventilation, no heroic measures.right handed   Right handed   Social  Determinants of Health   Financial Resource Strain: Not on file  Food Insecurity: Not on file  Transportation Needs: Not on file  Physical Activity: Not on file  Stress: Not on file  Social Connections: Not on file  Intimate Partner Violence: Not on file  :  Pertinent items are noted in HPI.  Exam: Blood pressure 127/72, pulse 85, temperature 97.6 F (36.4 C), temperature source Tympanic, resp. rate 17, height 5\' 11"  (1.803 m), weight 159 lb 8 oz (72.3 kg), SpO2 100 %.  ECOG 2  General appearance: alert and cooperative appeared without distress. Head: atraumatic without any abnormalities. Eyes: conjunctivae/corneas clear. PERRL.  Sclera anicteric. Throat: lips, mucosa, and tongue normal; without oral thrush or ulcers. Resp: clear to auscultation bilaterally without rhonchi, wheezes or dullness to percussion. Cardio: regular rate and rhythm, S1, S2 normal, no murmur, click, rub or gallop GI: soft, non-tender; bowel sounds normal; no masses,  no organomegaly Skin: Skin color, texture, turgor normal. No rashes or lesions Lymph nodes: Cervical, supraclavicular, and axillary nodes normal. Neurologic: Grossly normal without any motor, sensory or deep tendon reflexes. Musculoskeletal: No joint deformity or effusion.  CBC    Component Value Date/Time   WBC 7.5 02/02/2018 1443   RBC 4.49 02/02/2018 1443  HGB 12.6 (L) 04/15/2020 1111   HCT 33.7 (L) 02/02/2018 1443   PLT 401 (H) 02/02/2018 1443   MCV 75.1 (L) 02/02/2018 1443   MCH 22.5 (L) 02/02/2018 1443   MCHC 30.0 02/02/2018 1443   RDW 16.3 (H) 02/02/2018 1443   LYMPHSABS 1.0 02/02/2018 1443   MONOABS 1.2 (H) 02/02/2018 1443   EOSABS 0.1 02/02/2018 1443   BASOSABS 0.0 02/02/2018 1443      Assessment and Plan:    87 year old with:  1.  Prostate cancer diagnosed in the year 2000 and underwent radical prostatectomy at that time.  The final pathology including the exact Gleason score is not available to me at this time.   He subsequently developed biochemical relapse and has been receiving androgen deprivation therapy under the care of Dr. Lovena Neighbours.  His PSA has been under reasonable control in October 2021 was 1.27.  Imaging studies in April 2022 showed a 14 mm pulmonary nodule but no other area of metastasis noted.   The natural course of his disease was reviewed at this time the likelihood of a castration-sensitive recurrent prostate cancer was discussed at this time.  He is currently on androgen deprivation therapy which means he could be developing castration-resistant disease.  At this time, would like to update his cancer status and obtaining PSMA scan as well as obtaining a recent PSA to determine whether he has widespread metastasis versus oligometastatic progression.  Treatment options at this time will be determined whether will be a local therapy if he has isolated metastasis versus therapy escalation with adding androgen synthesis inhibitors such as Zytiga, androgen receptor blockade such as Xtandi or systemic chemotherapy.  Risks and benefits of all these approaches were discussed at this time and will be reviewed further after he completes his staging scans.  2.  Pulmonary nodule: He has a 14 mm nodule noted at the base of the lung.  This could be a limited metastatic disease could be also a benign etiology or a different malignancy.  Will obtain a PET scan for determination and near future and will consider biopsy if the diagnosis remains elusive.  3.  Androgen deprivation: For the time being I recommended continuing this indefinitely.  4.  Follow-up: Will be in the next few weeks after completing his staging work-up and evaluation.   60  minutes were dedicated to this visit. The time was spent on reviewing laboratory data, imaging studies, discussing treatment options, discussing differential diagnosis and answering questions regarding future plan.      A copy of this consult has been forwarded to the  requesting physician.

## 2020-04-25 NOTE — Progress Notes (Signed)
Remote pacemaker transmission.   

## 2020-04-29 ENCOUNTER — Encounter (HOSPITAL_COMMUNITY)
Admission: RE | Admit: 2020-04-29 | Discharge: 2020-04-29 | Disposition: A | Discharge status: Home / Self Care | Payer: Medicare Other | Source: Ambulatory Visit | Attending: Nephrology | Admitting: Nephrology

## 2020-04-29 ENCOUNTER — Other Ambulatory Visit: Payer: Self-pay

## 2020-04-29 ENCOUNTER — Ambulatory Visit: Payer: Medicare Other | Admitting: Podiatry

## 2020-04-29 VITALS — BP 137/62 | HR 72 | Temp 97.7°F | Resp 20

## 2020-04-29 DIAGNOSIS — N183 Chronic kidney disease, stage 3 unspecified: Secondary | ICD-10-CM

## 2020-04-29 LAB — POCT HEMOGLOBIN-HEMACUE: Hemoglobin: 12.7 g/dL — ABNORMAL LOW (ref 13.0–17.0)

## 2020-04-29 MED ORDER — EPOETIN ALFA-EPBX 10000 UNIT/ML IJ SOLN: 20000.0000 [IU] | INTRAMUSCULAR | Status: DC

## 2020-05-02 ENCOUNTER — Telehealth (INDEPENDENT_AMBULATORY_CARE_PROVIDER_SITE_OTHER): Payer: Medicare Other | Admitting: Internal Medicine

## 2020-05-02 ENCOUNTER — Encounter: Payer: Self-pay | Admitting: Internal Medicine

## 2020-05-02 ENCOUNTER — Other Ambulatory Visit: Payer: Medicare Other

## 2020-05-02 DIAGNOSIS — J069 Acute upper respiratory infection, unspecified: Secondary | ICD-10-CM | POA: Insufficient documentation

## 2020-05-02 DIAGNOSIS — I2510 Atherosclerotic heart disease of native coronary artery without angina pectoris: Secondary | ICD-10-CM | POA: Diagnosis not present

## 2020-05-02 NOTE — Assessment & Plan Note (Signed)
Acute Symptoms consistent with viral infection - I do not think this is bacterial Advised getting tested for covid - we need to rule this out Discussed otc allergy and cold medications for symptoms relief Will update me with covid results, sooner if symptoms worsen

## 2020-05-02 NOTE — Progress Notes (Signed)
Virtual Visit via Video Note  I connected with [name] on 05/02/20 at  2:00 PM EDT by a video enabled telemedicine application and verified that I am speaking with the correct person using two identifiers.   I discussed the limitations of evaluation and management by telemedicine and the availability of in person appointments. The patient expressed understanding and agreed to proceed.  Present for the visit:  Myself, Dr Billey Gosling, Donata Duff.  The patient is currently at home and I am in the office.    No referring provider.    History of Present Illness: He is here for an acute visit for cold symptoms.  His symptoms started three days ago  He is experiencing a cough and Sat and Sunday the mucus was clear, today there is a little color to it.  He states increased fatigue, but that is not new.  He states congestion, clogged sensation in ears, mild myalgias and lightheadedness.  His wife is also sick.    He has tried taking tylenol.    Review of Systems  Constitutional: Positive for chills (not new) and malaise/fatigue. Negative for fever.  HENT: Positive for congestion. Negative for ear pain, sinus pain and sore throat (dryness).        Ears feel clogged  Respiratory: Positive for cough, sputum production (clear, a little color today) and shortness of breath (no change - chronic). Negative for wheezing.   Gastrointestinal: Positive for abdominal pain (RUQ pain - transient yesterday). Negative for diarrhea and nausea.  Musculoskeletal: Positive for myalgias (mild).  Neurological: Negative for dizziness and headaches.       Lightheadedness      Social History   Socioeconomic History  . Marital status: Married    Spouse name: [name]  . Number of children: 2  . Years of education: Not on file  . Highest education level: Some college, no degree  Occupational History  . 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 Smoking status: Never Smoker  . Smokeless tobacco: Never Used  Vaping Use  . Vaping Use: Never used  Substance and Sexual Activity  . Alcohol use: Yes    Comment: Rarely  . Drug use: No  . Sexual activity: Not on file  Other Topics Concern  . Not on file  Social History Narrative   HSG, 1 year college.  married '52 - 3 years, divorced; married '56 - 3 years divorced; married '29-12 yrs - divorced; married '75 -. 1 son '57; 1 daughter - '53; 1 grandchild.  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  Very happily married.  End of life care: yes CPR, no long term mechanical ventilation, no heroic measures.right handed   Right handed   Social Determinants of Health   Financial Resource Strain: Not on file  Food Insecurity: Not on file  Transportation Needs: Not on file  Physical Activity: Not on file  Stress: Not on file  Social Connections: Not on file     Observations/Objective: Appears well in NAD Breathing normally Skin appears warm and dry  Assessment and Plan:  See Problem List for Assessment and Plan of chronic medical problems.   Follow Up Instructions:    I discussed the assessment and treatment plan with the patient. The patient was  provided an opportunity to ask questions and all were answered. The patient agreed with the plan and demonstrated an understanding of the instructions.   The patient was advised to call back or seek an in-person evaluation if the symptoms worsen or if the condition fails to improve as anticipated.    [name], MD

## 2020-05-03 ENCOUNTER — Ambulatory Visit: Payer: Medicare Other | Attending: Internal Medicine

## 2020-05-03 ENCOUNTER — Telehealth: Payer: Self-pay | Admitting: *Deleted

## 2020-05-03 DIAGNOSIS — Z20822 Contact with and (suspected) exposure to covid-19: Secondary | ICD-10-CM

## 2020-05-03 NOTE — Telephone Encounter (Signed)
PC from patient, he has lab appointment on 05/05/20.  He hasn't been feeling well, has sore throat & cough, had a covid test today but will not know results until Thursday.  Informed patient scheduling department will contact him to reschedule lab appointment.  Patient verbalizes understanding, message sent to scheduling.

## 2020-05-04 ENCOUNTER — Telehealth: Payer: Self-pay | Admitting: *Deleted

## 2020-05-04 ENCOUNTER — Telehealth: Payer: Self-pay | Admitting: Oncology

## 2020-05-04 ENCOUNTER — Telehealth: Payer: Self-pay | Admitting: Internal Medicine

## 2020-05-04 LAB — NOVEL CORONAVIRUS, NAA: SARS-CoV-2, NAA: DETECTED — AB

## 2020-05-04 LAB — SARS-COV-2, NAA 2 DAY TAT

## 2020-05-04 NOTE — Telephone Encounter (Signed)
-----   Message from Wyatt Portela, MD sent at 05/04/2020  3:16 PM EDT ----- Labs on 5/10. Keep MD on 5/12 ----- Message ----- From: Rolene Course, RN Sent: 05/04/2020   3:15 PM EDT To: Wyatt Portela, MD  Would you like a lab appointment added before his appointment with you on 5/12?  ----- Message ----- From: Wyatt Portela, MD Sent: 05/04/2020   3:13 PM EDT To: Rolene Course, RN  No keep it as is. Thanks ----- Message ----- From: Rolene Course, RN Sent: 05/04/2020   3:05 PM EDT To: Wyatt Portela, MD  This patient just found out he is covid positive today, he had a lab appointment tomorrow which I will get rescheduled.  He also has an appointment with you on 5/12.  Do you want this appointment changed as well?

## 2020-05-04 NOTE — Telephone Encounter (Signed)
Patient is requesting a call back in regards to recent Covid 19 test. Please advise

## 2020-05-04 NOTE — Telephone Encounter (Signed)
Scheduled appt per 4/27 sch msg. Pt aware.

## 2020-05-04 NOTE — Telephone Encounter (Signed)
Spoke with patient today. 

## 2020-05-04 NOTE — Telephone Encounter (Signed)
PC to patient informing him Dr. Alen Blew wants to keep his MD appointment on 05/19/20 the same.  RN has sent scheduling message to add lab appointment on 05/17/20.  Patient verbalizes understanding.

## 2020-05-05 ENCOUNTER — Other Ambulatory Visit: Payer: Self-pay | Admitting: Cardiovascular Disease

## 2020-05-05 ENCOUNTER — Inpatient Hospital Stay: Payer: Medicare Other

## 2020-05-05 ENCOUNTER — Telehealth: Payer: Self-pay

## 2020-05-05 ENCOUNTER — Encounter: Payer: Self-pay | Admitting: Internal Medicine

## 2020-05-05 ENCOUNTER — Other Ambulatory Visit: Payer: Self-pay | Admitting: Physician Assistant

## 2020-05-05 DIAGNOSIS — I2510 Atherosclerotic heart disease of native coronary artery without angina pectoris: Secondary | ICD-10-CM

## 2020-05-05 DIAGNOSIS — N1832 Chronic kidney disease, stage 3b: Secondary | ICD-10-CM

## 2020-05-05 DIAGNOSIS — I5032 Chronic diastolic (congestive) heart failure: Secondary | ICD-10-CM

## 2020-05-05 DIAGNOSIS — I10 Essential (primary) hypertension: Secondary | ICD-10-CM

## 2020-05-05 DIAGNOSIS — U071 COVID-19: Secondary | ICD-10-CM

## 2020-05-05 DIAGNOSIS — C61 Malignant neoplasm of prostate: Secondary | ICD-10-CM

## 2020-05-05 NOTE — Telephone Encounter (Signed)
Called to discuss with patient about COVID-19 symptoms and the use of one of the available treatments for those with mild to moderate Covid symptoms and at a high risk of hospitalization.  Pt appears to qualify for outpatient treatment due to co-morbid conditions and/or a member of an at-risk group in accordance with the FDA Emergency Use Authorization.    Symptom onset: Unknown Vaccinated: Yes Booster? Yes Immunocompromised?  Qualifiers: CHF,CAD NIH Criteria:   Unable to reach pt - Left message and call back number [phone].   Marcello Moores

## 2020-05-05 NOTE — Progress Notes (Signed)
I connected by phone with [name] on 05/05/2020 at 9:41 AM to discuss the potential use of a new treatment for mild to moderate COVID-19 viral infection in non-hospitalized patients.  This patient is a 87 y.o. male that meets the FDA criteria for Emergency Use Authorization of COVID monoclonal antibody bebtelovimab.  Has a (+) direct SARS-CoV-2 viral test result  Has mild or moderate COVID-19   Is NOT hospitalized due to COVID-19  Is within 10 days of symptom onset  Has at least one of the high risk factor(s) for progression to severe COVID-19 and/or hospitalization as defined in EUA.  Specific high risk criteria : Older age (>/= 85 yo), Chronic Kidney Disease (CKD), Immunosuppressive Disease or Treatment and Cardiovascular disease or hypertension   I have spoken and communicated the following to the patient or parent/caregiver regarding COVID monoclonal antibody treatment:  1. FDA has authorized the emergency use for the treatment of mild to moderate COVID-19 in adults and pediatric patients with positive results of direct SARS-CoV-2 viral testing who are 21 years of age and older weighing at least 40 kg, and who are at high risk for progressing to severe COVID-19 and/or hospitalization.  2. The significant known and potential risks and benefits of COVID monoclonal antibody, and the extent to which such potential risks and benefits are unknown.  3. Information on available alternative treatments and the risks and benefits of those alternatives, including clinical trials.  4. Patients treated with COVID monoclonal antibody should continue to self-isolate and use infection control measures (e.g., wear mask, isolate, social distance, avoid sharing personal items, clean and disinfect "high touch" surfaces, and frequent handwashing) according to CDC guidelines.   5. The patient or parent/caregiver has the option to accept or refuse COVID monoclonal antibody  treatment.  6. Discussion about the monoclonal antibody infusion does not ensure treatment. The patient will be placed on a list and scheduled according to risk, symptom onset and availability. A scheduler will reach to the patient to let them know if we can accommodate their infusion or not.  After reviewing this information with the patient, the patient has agreed to receive one of the available covid 19 monoclonal antibodies and will be provided an appropriate fact sheet prior to infusion.   [place], [place] 05/05/2020 9:41 AM

## 2020-05-06 MED ORDER — HYDROCODONE BIT-HOMATROP MBR 5-1.5 MG/5ML PO SOLN: 5.0000 mL | Freq: Four times a day (QID) | ORAL | 0 refills | Status: DC | PRN

## 2020-05-06 NOTE — Telephone Encounter (Signed)
Patient called and is requesting a call back in regards to the Covid infusion center. He can be reached at [phone].

## 2020-05-06 NOTE — Addendum Note (Signed)
Addended by: [name] on: 05/06/2020 11:55 AM   Modules accepted: Orders

## 2020-05-09 ENCOUNTER — Telehealth: Payer: Self-pay | Admitting: *Deleted

## 2020-05-09 NOTE — Telephone Encounter (Signed)
Received call from patient regarding upcoming appts in May 2022. Advised that he will need a PET scan done prior to seeing Dr. Alen Blew on 05/19/20. Provided him with [facility] Scheduling phone# for him to call  as he knows his current schedule.  Also advise d that I would put his lab appt on the same day as he sees Dr. Alen Blew so he only needs to come once that week (aside from his PET scan). Pt voiced understanding and will call to schedule this scan

## 2020-05-11 ENCOUNTER — Encounter: Payer: Self-pay | Admitting: Internal Medicine

## 2020-05-11 NOTE — Telephone Encounter (Signed)
Patient called and was requesting that someone read his mychart message and call him back at [phone]

## 2020-05-13 ENCOUNTER — Encounter (HOSPITAL_COMMUNITY): Payer: Medicare Other

## 2020-05-16 ENCOUNTER — Encounter (HOSPITAL_COMMUNITY)
Admission: RE | Admit: 2020-05-16 | Discharge: 2020-05-16 | Disposition: A | Discharge status: Home / Self Care | Payer: Medicare Other | Source: Ambulatory Visit | Attending: Nephrology | Admitting: Nephrology

## 2020-05-16 ENCOUNTER — Other Ambulatory Visit: Payer: Self-pay

## 2020-05-16 VITALS — BP 133/74 | HR 84 | Temp 97.9°F | Resp 20

## 2020-05-16 DIAGNOSIS — N183 Chronic kidney disease, stage 3 unspecified: Secondary | ICD-10-CM | POA: Insufficient documentation

## 2020-05-16 DIAGNOSIS — D631 Anemia in chronic kidney disease: Secondary | ICD-10-CM | POA: Diagnosis not present

## 2020-05-16 LAB — IRON AND TIBC
Iron: 65 ug/dL (ref 45–182)
Saturation Ratios: 28 % (ref 17.9–39.5)
TIBC: 232 ug/dL — ABNORMAL LOW (ref 250–450)
UIBC: 167 ug/dL

## 2020-05-16 LAB — FERRITIN: Ferritin: 294 ng/mL (ref 24–336)

## 2020-05-16 MED ORDER — EPOETIN ALFA-EPBX 10000 UNIT/ML IJ SOLN
INTRAMUSCULAR | Status: AC
  Administered 2020-05-16: 20000 [IU] via SUBCUTANEOUS
  Filled 2020-05-16: qty 2

## 2020-05-16 MED ORDER — EPOETIN ALFA-EPBX 10000 UNIT/ML IJ SOLN
20000.0000 [IU] | INTRAMUSCULAR | Status: DC
Start: 2020-05-16 — End: 2020-05-17

## 2020-05-17 ENCOUNTER — Other Ambulatory Visit: Payer: Medicare Other

## 2020-05-17 NOTE — Telephone Encounter (Signed)
Patient calling because he has gotten two different dates and has a few more questions

## 2020-05-18 ENCOUNTER — Ambulatory Visit (INDEPENDENT_AMBULATORY_CARE_PROVIDER_SITE_OTHER): Payer: Medicare Other

## 2020-05-18 ENCOUNTER — Ambulatory Visit: Payer: Medicare Other

## 2020-05-18 ENCOUNTER — Ambulatory Visit (HOSPITAL_COMMUNITY)
Admission: RE | Admit: 2020-05-18 | Discharge: 2020-05-18 | Disposition: A | Discharge status: Home / Self Care | Payer: Medicare Other | Source: Ambulatory Visit | Attending: Oncology | Admitting: Oncology

## 2020-05-18 ENCOUNTER — Other Ambulatory Visit: Payer: Self-pay

## 2020-05-18 DIAGNOSIS — C61 Malignant neoplasm of prostate: Secondary | ICD-10-CM | POA: Insufficient documentation

## 2020-05-18 DIAGNOSIS — E538 Deficiency of other specified B group vitamins: Secondary | ICD-10-CM

## 2020-05-18 MED ORDER — PIFLIFOLASTAT F 18 (PYLARIFY) INJECTION
9.0000 | Freq: Once | INTRAVENOUS | Status: AC
  Administered 2020-05-18: 9.3 via INTRAVENOUS

## 2020-05-18 NOTE — Progress Notes (Signed)
Patient came into the office today for his vitamin b12 injection. He tolerated the injection well. No questions or concerns.

## 2020-05-19 ENCOUNTER — Inpatient Hospital Stay: Payer: Medicare Other | Attending: Oncology | Admitting: Oncology

## 2020-05-19 ENCOUNTER — Telehealth: Payer: Self-pay | Admitting: *Deleted

## 2020-05-19 ENCOUNTER — Inpatient Hospital Stay: Payer: Medicare Other

## 2020-05-19 VITALS — BP 106/63 | HR 84 | Temp 98.0°F | Resp 17 | Ht 71.0 in | Wt 153.3 lb

## 2020-05-19 DIAGNOSIS — I2510 Atherosclerotic heart disease of native coronary artery without angina pectoris: Secondary | ICD-10-CM | POA: Diagnosis not present

## 2020-05-19 DIAGNOSIS — R911 Solitary pulmonary nodule: Secondary | ICD-10-CM | POA: Insufficient documentation

## 2020-05-19 DIAGNOSIS — C61 Malignant neoplasm of prostate: Secondary | ICD-10-CM

## 2020-05-19 DIAGNOSIS — Z79899 Other long term (current) drug therapy: Secondary | ICD-10-CM | POA: Insufficient documentation

## 2020-05-19 LAB — CBC WITH DIFFERENTIAL (CANCER CENTER ONLY)
Abs Immature Granulocytes: 0.04 10*3/uL (ref 0.00–0.07)
Basophils Absolute: 0 10*3/uL (ref 0.0–0.1)
Basophils Relative: 0 %
Eosinophils Absolute: 0.1 10*3/uL (ref 0.0–0.5)
Eosinophils Relative: 1 %
HCT: 35.2 % — ABNORMAL LOW (ref 39.0–52.0)
Hemoglobin: 11.3 g/dL — ABNORMAL LOW (ref 13.0–17.0)
Immature Granulocytes: 1 %
Lymphocytes Relative: 10 %
Lymphs Abs: 0.6 10*3/uL — ABNORMAL LOW (ref 0.7–4.0)
MCH: 25.7 pg — ABNORMAL LOW (ref 26.0–34.0)
MCHC: 32.1 g/dL (ref 30.0–36.0)
MCV: 80 fL (ref 80.0–100.0)
Monocytes Absolute: 0.8 10*3/uL (ref 0.1–1.0)
Monocytes Relative: 13 %
Neutro Abs: 5 10*3/uL (ref 1.7–7.7)
Neutrophils Relative %: 75 %
Platelet Count: 217 10*3/uL (ref 150–400)
RBC: 4.4 MIL/uL (ref 4.22–5.81)
RDW: 17.3 % — ABNORMAL HIGH (ref 11.5–15.5)
WBC Count: 6.6 10*3/uL (ref 4.0–10.5)
nRBC: 0 % (ref 0.0–0.2)

## 2020-05-19 LAB — CMP (CANCER CENTER ONLY)
ALT: 9 U/L (ref 0–44)
AST: 16 U/L (ref 15–41)
Albumin: 3.6 g/dL (ref 3.5–5.0)
Alkaline Phosphatase: 63 U/L (ref 38–126)
Anion gap: 9 (ref 5–15)
BUN: 28 mg/dL — ABNORMAL HIGH (ref 8–23)
CO2: 28 mmol/L (ref 22–32)
Calcium: 9.4 mg/dL (ref 8.9–10.3)
Chloride: 103 mmol/L (ref 98–111)
Creatinine: 2.15 mg/dL — ABNORMAL HIGH (ref 0.61–1.24)
GFR, Estimated: 28 mL/min — ABNORMAL LOW (ref >60)
Glucose, Bld: 85 mg/dL (ref 70–99)
Potassium: 4.3 mmol/L (ref 3.5–5.1)
Sodium: 140 mmol/L (ref 135–145)
Total Bilirubin: 0.7 mg/dL (ref 0.3–1.2)
Total Protein: 7.1 g/dL (ref 6.5–8.1)

## 2020-05-19 NOTE — Telephone Encounter (Signed)
Noted  

## 2020-05-19 NOTE — Progress Notes (Signed)
Hematology and Oncology Follow Up Visit  [name] [id number] 08-10-1926 87 y.o. 05/19/2020 3:12 PM Burns, Claudina Lick, MDBurns, Claudina Lick, MD   Principle Diagnosis: 87 year old man with prostate cancer diagnosed in the year 2000.  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   Prior Therapy:  He underwent radical prostatectomy in the year 2000.    He has subsequently developed a biochemical relapse and started receiving ADT under the care of Dr. Lovena Neighbours in November 2021.    His PSA in November 2021 was 1.27 previously it was 4.3 in October 2021 and in 2019 was 3.74.   Current therapy:  Eligard every 6 months under the care of Dr. Lovena Neighbours started November 2021.  Interim History: [name] returns today for a follow-up visit.  Since the last visit, he reports no major changes in his health.  He continues to have pulmonary symptoms predominantly occasional cough and shortness of breath which is chronic in nature given his bronchiectasis.  He denies any worsening bone pain or pathological fractures.  Denies any hospitalization or illnesses.     Medications: I have reviewed the patient's current medications.  Current Outpatient Medications  Medication Sig Dispense Refill  . acetaminophen (TYLENOL) 500 MG tablet Take 500 mg by mouth every 6 (six) hours as needed.    [name] allopurinol (ZYLOPRIM) 100 MG tablet TAKE 1 TABLET(100 MG) BY MOUTH DAILY 90 tablet 1  . amLODipine (NORVASC) 5 MG tablet Take 1 tablet (5 mg total) by mouth daily. 90 tablet 2  . Aromatic Inhalants (VICKS VAPOR INHALER IN) Place 1 puff into both nostrils as needed (for congestion).    [name] aspirin EC 81 MG tablet Take 81 mg by mouth daily.    . Calcium Citrate-Vitamin D (CITRACAL + D PO) Take 2 tablets by mouth in the morning and at bedtime.    . Carboxymeth-Glycerin-Polysorb (REFRESH OPTIVE MEGA-3 OP) Place 1 drop into both eyes 2 (two) times daily.    [name] epoetin alfa-epbx (RETACRIT) 2000 UNIT/ML injection  2,000 Units every 14 (fourteen) days. Patient states he only gets it when he needs it but not every 2 weeks    . folic acid (FOLVITE) 1 MG tablet Take 1 tablet (1 mg total) by mouth daily. Annual appt due in May must see provider for future refills 90 tablet 0  . HYDROcodone bit-homatropine (HYDROMET) 5-1.5 MG/5ML syrup Take 5 mLs by mouth every 6 (six) hours as needed for cough. 120 mL 0  . leuprolide, 6 Month, (ELIGARD) 45 MG injection Inject 45 mg into the skin every 6 (six) months.    . loratadine (CLARITIN) 10 MG tablet Take 10 mg by mouth daily as needed (for seasonal allergies).    . nitroGLYCERIN (NITROSTAT) 0.4 MG SL tablet DISSOLVE 1 TABLET UNDER THE TONGUE EVERY 5 MINUTES AS NEEDED FOR CHEST PAIN, MAXIMUM 3 TABLETS 100 tablet 1  . nortriptyline (PAMELOR) 10 MG capsule Take 1 capsule (10 mg total) by mouth at bedtime. 30 capsule 5  . vitamin C (ASCORBIC ACID) 500 MG tablet Take 1,000 mg by mouth daily.     . Vitamin D, Ergocalciferol, (DRISDOL) 1.25 MG (50000 UT) CAPS capsule Take 50,000 Units by mouth every 30 (thirty) days.      Current Facility-Administered Medications  Medication Dose Route Frequency Provider Last Rate Last Admin  . cyanocobalamin ((VITAMIN B-12)) injection 1,000 mcg  1,000 mcg Intramuscular Q30 days [name], MD   1,000 mcg at 05/18/20 0953  . cyanocobalamin ((VITAMIN B-12))  injection 1,000 mcg  1,000 mcg Intramuscular Once [name], MD      . NON FORMULARY 1 application  1 application Topical PRN Landis Martins, DPM         Allergies:  Allergies  Allergen Reactions  . Aspirin Nausea And Vomiting and Other (See Comments)    Upset stomach- tolerates coated aspirin   . Lisinopril Anaphylaxis and Shortness Of Breath    After 3 tablets, he experienced trouble swallowing, throat tightness and hoarseness.   . Amoxicillin Nausea Only  . Atarax [Hydroxyzine Hcl] Nausea And Vomiting  . Cephalexin Diarrhea  . Ciprofloxacin Nausea Only  . Clindamycin  Other (See Comments)    Reaction not recalled  . Clobetasol Other (See Comments)    Not effective  . Codeine Nausea Only and Other (See Comments)    Stomach upset  . Fish Allergy Nausea And Vomiting  . Fluarix [Influenza Virus Vaccine] Itching  . Haemophilus Influenzae Itching  . Hydrocodone Nausea Only  . Hydrocodone-Acetaminophen Nausea And Vomiting  . Hydroxyzine Nausea And Vomiting  . Latex Itching and Other (See Comments)    (After flu shot)  . Macrobid [Nitrofurantoin Macrocrystal] Nausea Only  . Niacin Other (See Comments)    Unknown reaction  . Niacin-Lovastatin Er Other (See Comments)    Unsure of reaction. Taking simvastatin at home without problems  . Niacin-Lovastatin Er Other (See Comments)    Reaction not recalled  . Nitrofurantoin Other (See Comments)    Upset stomach   . Omeprazole Other (See Comments)    Reaction not recalled- stopped by MD  . Other Nausea And Vomiting  . Vibramycin [Doxycycline] Other (See Comments)    Reaction not recalled  . Adhesive [Tape] Rash  . Bactrim [Sulfamethoxazole-Trimethoprim] Rash  . Colchicine Rash  . Gabapentin     Painful pimples on tongue  . Nortriptyline Rash     Physical Exam: Blood pressure 106/63, pulse 84, temperature 98 F (36.7 C), temperature source Tympanic, resp. rate 17, height 5\' 11"  (1.803 m), weight 153 lb 4.8 oz (69.5 kg), SpO2 100 %. ECOG: 1   General appearance: Comfortable appearing without any discomfort Head: Normocephalic without any trauma Oropharynx: Mucous membranes are moist and pink without any thrush or ulcers. Eyes: Pupils are equal and round reactive to light. Lymph nodes: No cervical, supraclavicular, inguinal or axillary lymphadenopathy.   Heart:regular rate and rhythm.  S1 and S2 without leg edema. Lung: Clear without any rhonchi or wheezes.  No dullness to percussion. Abdomin: Soft, nontender, nondistended with good bowel sounds.  No hepatosplenomegaly. Musculoskeletal: No joint  deformity or effusion.  Full range of motion noted. Neurological: No deficits noted on motor, sensory and deep tendon reflex exam. Skin: No petechial rash or dryness.  Appeared moist.      Lab Results: Lab Results  Component Value Date   WBC 6.6 05/19/2020   HGB 11.3 (L) 05/19/2020   HCT 35.2 (L) 05/19/2020   MCV 80.0 05/19/2020   PLT 217 05/19/2020     Chemistry      Component Value Date/Time   NA 140 05/19/2020 1433   NA 140 11/04/2017 1013   K 4.3 05/19/2020 1433   CL 103 05/19/2020 1433   CO2 28 05/19/2020 1433   BUN 28 (H) 05/19/2020 1433   BUN 29 (H) 11/04/2017 1013   CREATININE 2.15 (H) 05/19/2020 1433   GLU 72 09/10/2013 0000      Component Value Date/Time   CALCIUM 9.4 05/19/2020 1433  ALKPHOS 63 05/19/2020 1433   AST 16 05/19/2020 1433   ALT 9 05/19/2020 1433   BILITOT 0.7 05/19/2020 1433       Radiological Studies: NM PET (F18-PYLARIFY) SKULL TO MID THIGH  Result Date: 05/19/2020 CLINICAL DATA:  87 year old male with prostate carcinoma. Initial diagnosis 2000. Elevated PSA equal 1.27. EXAM: NUCLEAR MEDICINE PET SKULL BASE TO THIGH TECHNIQUE: 9.3 mCi F18 Piflufolastat (Pylarify) was injected intravenously. Full-ring PET imaging was performed from the skull base to thigh after the radiotracer. CT data was obtained and used for attenuation correction and anatomic localization. COMPARISON:  Bone scan 11/03/2019 CT abdomen 02/02/2018 FINDINGS: NECK No radiotracer activity in neck lymph nodes. Incidental CT finding: None CHEST No radiotracer accumulation within mediastinal or hilar lymph nodes. No suspicious pulmonary nodules on the CT scan. Incidental CT finding: Focus of mild consolidation in the RIGHT lower lobe (image 106/4) ABDOMEN/PELVIS Prostate: No focal activity in prostatectomy bed. Lymph nodes: No abnormal radiotracer accumulation within pelvic or abdominal nodes. Liver: No evidence of liver metastasis Incidental CT finding: Atherosclerotic calcification of  the aorta. SKELETON No focal activity to suggest skeletal metastasis. No sclerotic lesions are lytic lesions identified. IMPRESSION: 1. No evidence of local prostate cancer recurrence in the prostatectomy bed. 2. No evidence of metastatic adenopathy in the pelvis or periaortic retroperitoneum. 3. No evidence visceral metastasis or skeletal metastasis. 4. Focus of mild consolidation in the medial RIGHT lower lobe suggests mild pneumonia or aspiration pneumonitis. Recommend correlation for signs and symptoms of pulmonary infection. These results will be called to the ordering clinician or representative by the Radiologist Assistant, and communication documented in the PACS or [employer]. Electronically Signed   By: Suzy Bouchard M.D.   On: 05/19/2020 15:00     Impression and Plan:   87 year old with:  1.    Biochemically recurrent prostate cancer after initially diagnosed in the year 2000.  He is currently on androgen deprivation therapy alone.  His disease status was updated at this time including review of his PET scan that was obtained on May 18 2020.  His PET scan showed no evidence of prostate cancer recurrence or lymphadenopathy.  Mild focus of consolidation noted in the right lower lobe suggest mild pneumonia or aspiration.    Treatment options at this time were discussed given the fact that he has no evidence of documented metastatic disease.  Therapy escalation with Nicki Reaper and systemic chemotherapy will be deferred at this time given his overall health status and overall mild increase in his PSA.  This option will be revisited in the future.   2.  Pulmonary nodule:  CT scan of the chest completed in April 2022 which did not show any enlarging nodules rather than likely bronchiectasis findings.  This was discussed with the patient in detail indicating that his pulmonary findings are not related to malignancy.  I did recommend follow-up with pulmonary medicine given his history  of bronchiectasis and symptoms.   3.  Androgen deprivation: He is receiving that under the care of Dr. Lovena Neighbours return recommended continuing.  4.  Follow-up: I am happy to see him in the future as needed.   30  minutes were spent on this encounter.  Time was dedicated to reviewing disease status, discussing imaging studies, discussing management choices and future plan of care reviewed.   Zola Button, MD 5/12/20223:12 PM

## 2020-05-19 NOTE — Telephone Encounter (Signed)
Contacted by [facility] for results of Simpson scan - spoke with Diane.  IMPRESSION: 1. No evidence of local prostate cancer recurrence in the prostatectomy bed. 2. No evidence of metastatic adenopathy in the pelvis or periaortic retroperitoneum. 3. No evidence visceral metastasis or skeletal metastasis. 4. Focus of mild consolidation in the medial RIGHT lower lobe suggests mild pneumonia or aspiration pneumonitis. Recommend correlation for signs and symptoms of pulmonary infection.  Results routed to Dr. Alen Blew

## 2020-05-20 LAB — TESTOSTERONE: Testosterone: <3 ng/dL — ABNORMAL LOW (ref 264–916)

## 2020-05-20 LAB — PROSTATE-SPECIFIC AG, SERUM (LABCORP): Prostate Specific Ag, Serum: <0.1 ng/mL (ref 0.0–4.0)

## 2020-05-20 NOTE — Progress Notes (Signed)
b12 Injection given.   Keyna Blizard J Dezmond Downie, MD  

## 2020-05-23 ENCOUNTER — Emergency Department (HOSPITAL_COMMUNITY): Payer: Medicare Other

## 2020-05-23 ENCOUNTER — Other Ambulatory Visit: Payer: Self-pay

## 2020-05-23 ENCOUNTER — Emergency Department (HOSPITAL_COMMUNITY)
Admission: EM | Admit: 2020-05-23 | Discharge: 2020-05-23 | Disposition: A | Discharge status: Home / Self Care | Payer: Medicare Other | Attending: Emergency Medicine | Admitting: Emergency Medicine

## 2020-05-23 ENCOUNTER — Encounter (HOSPITAL_COMMUNITY): Payer: Self-pay

## 2020-05-23 DIAGNOSIS — K219 Gastro-esophageal reflux disease without esophagitis: Secondary | ICD-10-CM | POA: Insufficient documentation

## 2020-05-23 DIAGNOSIS — I2510 Atherosclerotic heart disease of native coronary artery without angina pectoris: Secondary | ICD-10-CM | POA: Insufficient documentation

## 2020-05-23 DIAGNOSIS — R0602 Shortness of breath: Secondary | ICD-10-CM | POA: Diagnosis present

## 2020-05-23 DIAGNOSIS — Z950 Presence of cardiac pacemaker: Secondary | ICD-10-CM | POA: Insufficient documentation

## 2020-05-23 DIAGNOSIS — N183 Chronic kidney disease, stage 3 unspecified: Secondary | ICD-10-CM | POA: Diagnosis not present

## 2020-05-23 DIAGNOSIS — Z79899 Other long term (current) drug therapy: Secondary | ICD-10-CM | POA: Diagnosis not present

## 2020-05-23 DIAGNOSIS — Z7982 Long term (current) use of aspirin: Secondary | ICD-10-CM | POA: Insufficient documentation

## 2020-05-23 DIAGNOSIS — Z8546 Personal history of malignant neoplasm of prostate: Secondary | ICD-10-CM | POA: Diagnosis not present

## 2020-05-23 DIAGNOSIS — I130 Hypertensive heart and chronic kidney disease with heart failure and stage 1 through stage 4 chronic kidney disease, or unspecified chronic kidney disease: Secondary | ICD-10-CM | POA: Insufficient documentation

## 2020-05-23 DIAGNOSIS — Z91040 Latex allergy status: Secondary | ICD-10-CM | POA: Insufficient documentation

## 2020-05-23 DIAGNOSIS — I5032 Chronic diastolic (congestive) heart failure: Secondary | ICD-10-CM | POA: Diagnosis not present

## 2020-05-23 DIAGNOSIS — Z951 Presence of aortocoronary bypass graft: Secondary | ICD-10-CM | POA: Diagnosis not present

## 2020-05-23 LAB — CBC WITH DIFFERENTIAL/PLATELET
Abs Immature Granulocytes: 0.17 10*3/uL — ABNORMAL HIGH (ref 0.00–0.07)
Basophils Absolute: 0.1 10*3/uL (ref 0.0–0.1)
Basophils Relative: 1 %
Eosinophils Absolute: 0.1 10*3/uL (ref 0.0–0.5)
Eosinophils Relative: 1 %
HCT: 39.5 % (ref 39.0–52.0)
Hemoglobin: 12.4 g/dL — ABNORMAL LOW (ref 13.0–17.0)
Immature Granulocytes: 2 %
Lymphocytes Relative: 15 %
Lymphs Abs: 1.2 10*3/uL (ref 0.7–4.0)
MCH: 25.6 pg — ABNORMAL LOW (ref 26.0–34.0)
MCHC: 31.4 g/dL (ref 30.0–36.0)
MCV: 81.6 fL (ref 80.0–100.0)
Monocytes Absolute: 0.9 10*3/uL (ref 0.1–1.0)
Monocytes Relative: 12 %
Neutro Abs: 5.2 10*3/uL (ref 1.7–7.7)
Neutrophils Relative %: 69 %
Platelets: 350 10*3/uL (ref 150–400)
RBC: 4.84 MIL/uL (ref 4.22–5.81)
RDW: 18.4 % — ABNORMAL HIGH (ref 11.5–15.5)
WBC: 7.5 10*3/uL (ref 4.0–10.5)
nRBC: 0 % (ref 0.0–0.2)

## 2020-05-23 LAB — BASIC METABOLIC PANEL
Anion gap: 9 (ref 5–15)
BUN: 42 mg/dL — ABNORMAL HIGH (ref 8–23)
CO2: 24 mmol/L (ref 22–32)
Calcium: 9.8 mg/dL (ref 8.9–10.3)
Chloride: 103 mmol/L (ref 98–111)
Creatinine, Ser: 2.15 mg/dL — ABNORMAL HIGH (ref 0.61–1.24)
GFR, Estimated: 28 mL/min — ABNORMAL LOW (ref >60)
Glucose, Bld: 90 mg/dL (ref 70–99)
Potassium: 3.9 mmol/L (ref 3.5–5.1)
Sodium: 136 mmol/L (ref 135–145)

## 2020-05-23 LAB — TROPONIN I (HIGH SENSITIVITY)
Troponin I (High Sensitivity): 20 ng/L — ABNORMAL HIGH (ref <18)
Troponin I (High Sensitivity): 15 ng/L (ref <18)

## 2020-05-23 LAB — BRAIN NATRIURETIC PEPTIDE: B Natriuretic Peptide: 415 pg/mL — ABNORMAL HIGH (ref 0.0–100.0)

## 2020-05-23 MED ORDER — FUROSEMIDE 40 MG PO TABS: 40.0000 mg | ORAL_TABLET | Freq: Every day | ORAL | 0 refills | Status: DC

## 2020-05-23 MED ORDER — FAMOTIDINE 20 MG PO TABS: 20.0000 mg | ORAL_TABLET | Freq: Every day | ORAL | 0 refills | Status: DC

## 2020-05-23 MED ORDER — AMLODIPINE BESYLATE 5 MG PO TABS
5.0000 mg | ORAL_TABLET | Freq: Once | ORAL | Status: AC
  Administered 2020-05-23: 5 mg via ORAL
  Filled 2020-05-23: qty 1

## 2020-05-23 NOTE — ED Provider Notes (Incomplete)
I saw and evaluated the patient, reviewed the resident's note and I agree with the findings and plan.  EKG Interpretation  Date/Time:  Monday May 23 2020 11:43:03 EDT Ventricular Rate:  89 PR Interval:  232 QRS Duration: 134 QT Interval:  402 QTC Calculation: 490 R Axis:   -87 Text Interpretation: Sinus or ectopic atrial rhythm Prolonged PR interval Nonspecific IVCD with LAD Left ventricular hypertrophy Anterior Q waves, possibly due to LVH ST elevation, consider inferior injury no STEMI. increased IVCD since 2015 tracing Confirmed by Charlesetta Shanks [phone]) on 05/23/2020 1:29:06 PM

## 2020-05-23 NOTE — ED Provider Notes (Signed)
  Physical Exam  BP (!) 176/91   Pulse 78   Temp 98.2 F (36.8 C) (Temporal)   Resp 14   Ht 5\' 11"  (1.803 m)   Wt 69.5 kg   SpO2 100%   BMI 21.37 kg/m   Physical Exam Constitutional:      General: He is not in acute distress. Cardiovascular:     Rate and Rhythm: Normal rate.  Pulmonary:     Effort: Pulmonary effort is normal. No tachypnea.  Neurological:     General: No focal deficit present.     Mental Status: He is alert.     ED Course/Procedures     Procedures  MDM  Patient presents with a shortness of breath sensation that he feels worse at a suprasternal notch according to the off going provider.  My history and physical with him was similar overall.  Previous provider recommends that if his troponin does not escalate he can be safely discharged. Repeat Troponin results of 15.  Patient is without chest pain to my bedside evaluation.  Known history of CHF previous provider has arranged medications to deal with this in the outpatient setting.  Saturating well, no tachypnea.  Stable and appropriate for outpatient management.  Discussed case with patient as well as return precautions and outpatient follow-up.       Aris Lot, MD 05/23/20 3435    Elnora Morrison, MD 05/25/20 [phone]

## 2020-05-23 NOTE — ED Provider Notes (Addendum)
[facility] EMERGENCY DEPARTMENT Provider Note   CSN: [id number] Arrival date & time: 05/23/20  1136     History Chief Complaint  Patient presents with  . Shortness of Breath    [name] is a 87 y.o. male.  HPI 87 year old male with history of atrial fibrillation, CAD, dyslipidemia, renal artery stenosis, hypertension, and prostate cancer presents emergency department for shortness of breath.  He states he has had shortness of breath worsening for the last 2 years, triggered by exertion.  He states for the last 4 to 5 days he has had to sleep propped up due to shortness of breath, this is new.  Was diagnosed with COVID in late April, felt like he has recovered from that.  Denies worsening cough or fever, but does have persistent cough from COVID.  Denies chest pain or peripheral edema.  States he came here today because he pulled up his covers to his neck last night when he was going to bed.  Felt a sensation in his throat like he could not breathe.  This resolved when he pulled the covers back down.  He states all night he was pulling the covers up and down because he felt like they were making it hard to breathe.  Still reports a sensation at his clavicular notch that feels like he is having trouble breathing, but that it is significantly better.    Past Medical History:  Diagnosis Date  . Acute superficial venous thrombosis of lower extremity    a. RLE after CABG, neg dopp for DVT.  [name] Allergy   . Anemia   . Atrial fibrillation ([place])    a. Post-op from CABG, on amiodarone temporarily, d/c'd 12/2011  . CAD (coronary artery disease)    a. S/P stenting to mid RCA, prox PDA 06/1999. b. NSTEMI/CABG x 3 in 10/2011 with LIMA to LAD, SVG to PDA, and SVG to OM1.   . Chronic UTI    a. Followed by Dr. Risa Grill - colonized/asymptomatic - not on abx  . CKD (chronic kidney disease), stage III (HCC)    a. stable with a creatinine around 1.9-2.0 followed by nephrology.  .  Dyslipidemia   . Echocardiogram    Echocardiogram 08/2018: EF 50-55, inf-lat AK, mild LVH, Gr 1 DD, RVSP 47, mild LAE, mild to mod MR, mild AI, mild AS (mean 11)  . GERD (gastroesophageal reflux disease)   . Hypertension    well-controlled.  . Myocardial infarction ([place])   . Neck injury    a. C3-C4 and C4-C5 foraminal narrowing, severe  . Prostate cancer ([place])    a. 2001 s/p TURP.  . Pulmonary nodule    a. felt to be noncancerous.  Status post followup CT scan 4 mm and stable.  . Renal artery stenosis ([place])    a. 50% by cath 2001  . 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 Viral upper respiratory tract infection 05/02/2020  . Weakness of both lower extremities 04/09/2020  . Anxiety and depression 04/09/2020  . Numbness of left foot 10/21/2019  . S/P inguinal hernia repair 08/13/2019  . Left inguinal hernia 07/01/2019  . Weight loss 06/18/2018  . Myositis 06/18/2018  . Gout involving toe of left foot 05/12/2018  . Elevated CK 05/12/2018  . Myoclonic jerking 02/10/2018  . Chronic diastolic CHF (congestive heart failure) ([place]) 02/05/2018  . UGIB (upper  gastrointestinal bleed) 02/01/2018  . Lumbar post-laminectomy syndrome 12/12/2017  . Pain of left hip joint 11/29/2017  . Trochanteric bursitis of left hip 11/29/2017  . Hyperpigmentation 11/04/2017  . Spinal stenosis of lumbar region 10/09/2017  . Abnormal appearance of skin 10/07/2017  . Frequent urination 09/03/2017  . Gouty arthritis of right great toe 09/03/2017  . Postnasal drip 08/06/2017  . Rash and nonspecific skin eruption 08/06/2017  . Moderate aortic regurgitation 07/04/2017  . Diastolic dysfunction 93/81/0175  . Edema 06/29/2017  . SOB (shortness of breath) 06/29/2017  . Sinus symptom 06/05/2017  . Fatigue 05/18/2017  . Sleeping difficulty 05/18/2017  . Left foot pain 04/22/2017  . Trochanteric bursitis 01/10/2017   . Internal hemorrhoids 08/15/2016  . Dysphagia 02/06/2016  . Cephalalgia 07/14/2015  . Constipation 03/01/2015  . Myalgia 03/02/2014  . CHB (complete heart block) ([place]) 05/03/2012  . Postoperative atrial fibrillation ([place]) 05/03/2012  . Pacemaker 04/10/2012  . 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 Coronary atherosclerosis 11/27/2011  . Presence of aortocoronary bypass graft 10/24/2011  . Pulmonary nodule   . CAD (coronary artery disease)   . CKD (chronic kidney disease) stage 3, GFR 30-59 ml/min (HCC)   . Venous insufficiency of leg 06/05/2010  . SHOULDER PAIN, RIGHT, CHRONIC 10/14/2009  . B12 deficiency 08/23/2009  . Peripheral neuropathy 03/21/2009  . DEGENERATIVE DISC DISEASE, CERVICAL SPINE, W/RADICULOPATHY 09/21/2008  . Dyslipidemia 01/17/2007  . Essential hypertension 07/20/2006  . Prostate cancer ([place]) 07/20/2006    Past Surgical History:  Procedure Laterality Date  . ANTERIOR CHAMBER WASHOUT Left 10/04/2018   Procedure: Anterior Chamber Washout, Vitreous Tap;  Surgeon: Jalene Mullet, MD;  Location: [facility];  Service: Ophthalmology;  Laterality: Left;  . cardia catherization  07-07-99  . CARDIAC SURGERY  10/18/12   open heart surgery  . CATARACT EXTRACTION W/ INTRAOCULAR LENS  IMPLANT, BILATERAL  3/205, 06/2013   [facility]  . COLONOSCOPY  04/12/07  . CORONARY ARTERY BYPASS GRAFT  10/19/2011   Procedure: CORONARY ARTERY BYPASS GRAFTING (CABG);  Surgeon: Gaye Pollack, MD;  Location: [facility];  Service: Open Heart Surgery;  Laterality: N/A;  times three using Left Internal Mammary Artery and Right Greater [place]hen[place] Vein Graft harvested Endoscopically  . edg  07-17-1994  . FLEXIBLE SIGMOIDOSCOPY  11-03-1997  . GAS INSERTION Left 10/04/2018   Procedure: Insertion Of Gas;  Surgeon: Jalene Mullet, MD;  Location: [facility];  Service: Ophthalmology;  Laterality: Left;  [name] GAS/FLUID EXCHANGE Left 10/04/2018   Procedure: Gas/Fluid Exchange;  Surgeon: Jalene Mullet,  MD;  Location: [facility];  Service: Ophthalmology;  Laterality: Left;  . LEFT HEART CATHETERIZATION WITH CORONARY ANGIOGRAM N/A 10/16/2011   Procedure: LEFT HEART CATHETERIZATION WITH CORONARY ANGIOGRAM;  Surgeon: Peter M Martinique, MD;  Location: [facility] CATH LAB;  Service: Cardiovascular;  Laterality: N/A;  . LEFT HEART CATHETERIZATION WITH CORONARY/GRAFT ANGIOGRAM N/A 03/11/2013   Procedure: LEFT HEART CATHETERIZATION WITH [name];  Surgeon: Blane Ohara, MD;  Location: [facility] CATH LAB;  Service: Cardiovascular;  Laterality: N/A;  . lumbar spinal disk and neck fusion surgery    . PACEMAKER INSERTION  03/28/12   PPM implanted for mobitz II AV block  . PARS PLANA VITRECTOMY Left 10/04/2018   Procedure: PARS PLANA VITRECTOMY 25 GAUGE FOR ENDOPHTHALMITIS WITH INJECTION OF INTRAVITREAL ANTIBIOTIC;  Surgeon: Jalene Mullet, MD;  Location: [facility];  Service: Ophthalmology;  Laterality: Left;  . peripheral vascular catherization  11-24-03  . PERMANENT PACEMAKER INSERTION N/A 03/28/2012  Procedure: PERMANENT PACEMAKER INSERTION;  Surgeon: Thompson Grayer, MD;  Location: [facility] CATH LAB;  Service: Cardiovascular;  Laterality: N/A;  . PROSTATECTOMY    . renal circulation  10-01-03  . s/p ptca    . stents     X 2  . stress cardiolite  05-04-05   spring 09-negative except for apical thinning, EF 68%       Family History  Problem Relation Age of Onset  . Coronary artery disease Father        died @ 15  . Other Mother        cerebral hemorrhage - died @ 29  . Arthritis Mother   . Cancer Brother        Bladder  . Prostate cancer Brother   . Nephritis Brother        died @ age 19.  . Other Brother        cerebral hemorrhage - died @ 40  . Heart disease Brother   . Breast cancer Other        niece x 2  . Diabetes Neg Hx   . Colon cancer Neg Hx   . Adrenal disorder Neg Hx     Social History   Tobacco Use  . Smoking status: Never Smoker  . Smokeless tobacco: Never Used  Vaping Use  . Vaping  Use: Never used  Substance Use Topics  . Alcohol use: Yes    Comment: Rarely  . Drug use: No    Home Medications Prior to Admission medications   Medication Sig Start Date End Date Taking? Authorizing Provider  famotidine (PEPCID) 20 MG tablet Take 1 tablet (20 mg total) by mouth at bedtime. 05/23/20 06/22/20 Yes Lamisha Roussell, Delilah Shan, DO  furosemide (LASIX) 40 MG tablet Take 1 tablet (40 mg total) by mouth daily. 05/23/20 06/22/20 Yes Remas Sobel, Delilah Shan, DO  acetaminophen (TYLENOL) 500 MG tablet Take 500 mg by mouth every 6 (six) hours as needed.    [provider]  allopurinol (ZYLOPRIM) 100 MG tablet TAKE 1 TABLET(100 MG) BY MOUTH DAILY 01/05/20   [name], MD  amLODipine (NORVASC) 5 MG tablet Take 1 tablet (5 mg total) by mouth daily. 05/05/20   [name], MD  Aromatic Inhalants (VICKS VAPOR INHALER IN) Place 1 puff into both nostrils as needed (for congestion).    [provider]  aspirin EC 81 MG tablet Take 81 mg by mouth daily.    [provider]  Calcium Citrate-Vitamin D (CITRACAL + D PO) Take 2 tablets by mouth in the morning and at bedtime.    [provider]  Carboxymeth-Glycerin-Polysorb (REFRESH OPTIVE MEGA-3 OP) Place 1 drop into both eyes 2 (two) times daily.    [provider]  epoetin alfa-epbx (RETACRIT) 2000 UNIT/ML injection 2,000 Units every 14 (fourteen) days. Patient states he only gets it when he needs it but not every 2 weeks    [provider]  folic acid (FOLVITE) 1 MG tablet Take 1 tablet (1 mg total) by mouth daily. Annual appt due in May must see provider for future refills 02/02/19   [name], MD  HYDROcodone bit-homatropine (HYDROMET) 5-1.5 MG/5ML syrup Take 5 mLs by mouth every 6 (six) hours as needed for cough. 05/06/20   Burns, Claudina Lick, MD  leuprolide, 6 Month, (ELIGARD) 45 MG injection Inject 45 mg into the skin every 6 (six) months.    [provider]  loratadine (CLARITIN) 10 MG  tablet Take 10 mg by  mouth daily as needed (for seasonal allergies).    [provider]  nitroGLYCERIN (NITROSTAT) 0.4 MG SL tablet DISSOLVE 1 TABLET UNDER THE TONGUE EVERY 5 MINUTES AS NEEDED FOR CHEST PAIN, MAXIMUM 3 TABLETS 04/30/19   [name], MD  nortriptyline (PAMELOR) 10 MG capsule Take 1 capsule (10 mg total) by mouth at bedtime. 12/22/19   Pieter Partridge, DO  vitamin C (ASCORBIC ACID) 500 MG tablet Take 1,000 mg by mouth daily.     [provider]  Vitamin D, Ergocalciferol, (DRISDOL) 1.25 MG (50000 UT) CAPS capsule Take 50,000 Units by mouth every 30 (thirty) days.     [provider]    Allergies    Aspirin, Lisinopril, Amoxicillin, Atarax [hydroxyzine hcl], Cephalexin, Ciprofloxacin, Clindamycin, Clobetasol, Codeine, Fish allergy, Fluarix [influenza virus vaccine], Haemophilus influenzae, Hydrocodone, Hydrocodone-acetaminophen, Hydroxyzine, Latex, Macrobid [nitrofurantoin macrocrystal], Niacin, Niacin-lovastatin er, Niacin-lovastatin er, Nitrofurantoin, Omeprazole, Other, Vibramycin [doxycycline], Adhesive [tape], Bactrim [sulfamethoxazole-trimethoprim], Colchicine, Gabapentin, and Nortriptyline  Review of Systems   Review of Systems  Constitutional: Negative for chills and fever.  HENT: Negative for ear pain and sore throat.   Eyes: Negative for pain and visual disturbance.  Respiratory: Positive for cough and shortness of breath.   Cardiovascular: Negative for chest pain and palpitations.  Gastrointestinal: Negative for abdominal pain and vomiting.  Genitourinary: Negative for dysuria and hematuria.  Musculoskeletal: Negative for arthralgias and back pain.  Skin: Negative for color change and rash.  Neurological: Negative for seizures and syncope.  Psychiatric/Behavioral: The patient is not nervous/anxious.   All other systems reviewed and are negative.   Physical Exam Updated Vital Signs BP (!) 169/93   Pulse 83   Temp 98.2 F (36.8 C)  (Temporal)   Resp (!) 21   Ht 5\' 11"  (1.803 m)   Wt 69.5 kg   SpO2 99%   BMI 21.37 kg/m   Physical Exam Vitals and nursing note reviewed.  Constitutional:      Appearance: He is well-developed.  HENT:     Head: Normocephalic and atraumatic.  Eyes:     Conjunctiva/sclera: Conjunctivae normal.  Cardiovascular:     Rate and Rhythm: Normal rate and regular rhythm.     Pulses: Normal pulses.     Heart sounds: No murmur heard.   Pulmonary:     Effort: Pulmonary effort is normal. No respiratory distress.     Breath sounds: No stridor. Examination of the right-lower field reveals rhonchi. Rhonchi present.  Chest:     Chest wall: No tenderness or edema.  Abdominal:     Palpations: Abdomen is soft.     Tenderness: There is no abdominal tenderness.  Musculoskeletal:        General: Normal range of motion.     Cervical back: Neck supple.     Right lower leg: No tenderness. No edema.     Left lower leg: No tenderness. No edema.  Skin:    General: Skin is warm and dry.     Capillary Refill: Capillary refill takes less than 2 seconds.  Neurological:     General: No focal deficit present.     Mental Status: He is alert and oriented to person, place, and time.  Psychiatric:        Mood and Affect: Mood normal.        Behavior: Behavior normal.     ED Results / Procedures / Treatments   Labs (all labs ordered are listed, but only abnormal results are displayed) Labs Reviewed  CBC WITH DIFFERENTIAL/PLATELET - Abnormal; Notable for the following components:      Result Value   Hemoglobin 12.4 (*)    MCH 25.6 (*)    RDW 18.4 (*)    Abs Immature Granulocytes 0.17 (*)    All other components within normal limits  BASIC METABOLIC PANEL - Abnormal; Notable for the following components:   BUN 42 (*)    Creatinine, Ser 2.15 (*)    GFR, Estimated 28 (*)    All other components within normal limits  BRAIN NATRIURETIC PEPTIDE - Abnormal; Notable for the following components:   B  Natriuretic Peptide 415.0 (*)    All other components within normal limits  TROPONIN I (HIGH SENSITIVITY) - Abnormal; Notable for the following components:   Troponin I (High Sensitivity) 20 (*)    All other components within normal limits  TROPONIN I (HIGH SENSITIVITY)    EKG EKG Interpretation  Date/Time:  Monday May 23 2020 11:43:03 EDT Ventricular Rate:  89 PR Interval:  232 QRS Duration: 134 QT Interval:  402 QTC Calculation: 490 R Axis:   -87 Text Interpretation: Sinus or ectopic atrial rhythm Prolonged PR interval Nonspecific IVCD with LAD Left ventricular hypertrophy Anterior Q waves, possibly due to LVH ST elevation, consider inferior injury no STEMI. increased IVCD since 2015 tracing Confirmed by Charlesetta Shanks [phone]) on 05/23/2020 1:29:06 PM   Radiology DG Chest Portable 1 View  Result Date: 05/23/2020 CLINICAL DATA:  Shortness of breath EXAM: PORTABLE CHEST 1 VIEW COMPARISON:  Chest radiograph 02/02/2018, chest CT 04/25/2020 FINDINGS: Unchanged cardiomediastinal silhouette. Prior median sternotomy and CABG. Stable dual chamber pacemaker leads. There is no new focal airspace disease. There is no large pleural effusion or visible pneumothorax. There is no acute osseous abnormality. IMPRESSION: No evidence of acute cardiopulmonary disease. Electronically Signed   By: Maurine Simmering   On: 05/23/2020 12:32    Procedures Procedures   Medications Ordered in ED Medications  amLODipine (NORVASC) tablet 5 mg (5 mg Oral Given 05/23/20 1208)    ED Course  I have reviewed the triage vital signs and the nursing notes.  Pertinent labs & imaging results that were available during my care of the patient were reviewed by me and considered in my medical decision making (see chart for details).    MDM Rules/Calculators/A&P                          87 year old male presents emergency department for shortness of breath.  Vital signs are stable, he is not in acute distress.  On exam,  patient is speaking in full sentences.  Does not exhibit signs of respiratory distress.  Does have crackles in the base of the right lower lung.  Has somewhat distant heart sounds, but no obvious murmur.  Rhythm seems regular.  Pulses are normal.  Abdomen is soft and nontender.  No peripheral edema.  Patient seems to be having worsening shortness of breath over chronic.,  But it does seem to have acutely worsened in the last 4 to 5 days.  Differential includes pneumonia, postviral cough, CHF exacerbation, ACS, pleural effusion, pericardial effusion, pericarditis, PE.  Will obtain chest x-ray, EKG, and labs for further rish stratification and evaluation.  Chest x-ray shows no pleural effusion or pulmonary edema.  No overt pneumonia.   EKG shows sinus rhythm, not significantly changed from previous.  Do see P waves.  MP unremarkable.  BMP shows elevated creatinine, similar to previous this  month.  Does have 1 a year ago that shows creatinine was less at that time, but we do not have a more recent one for comparison. BNP elevated. Initial troponin 20.   Plan at time of handoff is to follow-up second troponin.  Patient will need very close follow-up.  As patient is not requiring oxygen, can be discharged home likely.  If second troponin is negative, would recommend very close PCP follow-up.  Do suspect patient has CHF exacerbation as probable cause of shortness of breath along with reflux causing throat sensation at night.  Diuresis could actually help with kidney function.  Would recommend short course of Lasix with very close follow-up for kidney function evaluation.  Final Clinical Impression(s) / ED Diagnoses Final diagnoses:  SOB (shortness of breath)  Gastroesophageal reflux disease, unspecified whether esophagitis present    Rx / DC Orders ED Discharge Orders         Ordered    famotidine (PEPCID) 20 MG tablet  Daily at bedtime        05/23/20 1444    furosemide (LASIX) 40 MG tablet  Daily         05/23/20 [place], Nimo Verastegui, DO 05/23/20 [place], [place], DO 05/23/20 1539    Charlesetta Shanks, MD 05/26/20 [phone]

## 2020-05-23 NOTE — ED Notes (Signed)
Dc instructions reviewed with pt. PT verbalized understanding. Pt DC.  °

## 2020-05-23 NOTE — Discharge Instructions (Addendum)
It is important that you follow-up with your normal doctor in the next few days.  Start taking Lasix daily, this will make you urinate which will help get fluid off your lungs to improve your breathing.  It can make your kidney function worse, so would like you to follow-up by Friday to repeat kidney function tests.  Weigh yourself every day to follow-up with how much fluid you have on your self.  Start taking Pepcid nightly as part of your throat problem could be due to acid reflux.

## 2020-05-23 NOTE — ED Triage Notes (Signed)
Pt bib [facility] from home with complaints of shob that has increased over the past week. Pt complains of increased sputum and coughing. Denies cp, n/v.  EMS vitals 210/98 74 HR 98% RA

## 2020-05-23 NOTE — ED Notes (Signed)
Report given to Josh N, RN  

## 2020-05-25 LAB — POCT HEMOGLOBIN-HEMACUE: Hemoglobin: 10.5 g/dL — ABNORMAL LOW (ref 13.0–17.0)

## 2020-05-30 ENCOUNTER — Encounter (HOSPITAL_COMMUNITY): Payer: Medicare Other

## 2020-06-01 ENCOUNTER — Ambulatory Visit (HOSPITAL_COMMUNITY)
Admission: RE | Admit: 2020-06-01 | Discharge: 2020-06-01 | Disposition: A | Discharge status: Home / Self Care | Payer: Medicare Other | Source: Ambulatory Visit | Attending: Nephrology | Admitting: Nephrology

## 2020-06-01 ENCOUNTER — Other Ambulatory Visit: Payer: Self-pay

## 2020-06-01 VITALS — BP 134/81 | HR 79 | Resp 18

## 2020-06-01 DIAGNOSIS — N183 Chronic kidney disease, stage 3 unspecified: Secondary | ICD-10-CM | POA: Insufficient documentation

## 2020-06-01 LAB — POCT HEMOGLOBIN-HEMACUE: Hemoglobin: 13.3 g/dL (ref 13.0–17.0)

## 2020-06-01 MED ORDER — EPOETIN ALFA-EPBX 10000 UNIT/ML IJ SOLN: 20000.0000 [IU] | INTRAMUSCULAR | Status: DC

## 2020-06-11 ENCOUNTER — Encounter (HOSPITAL_COMMUNITY): Payer: Self-pay

## 2020-06-13 ENCOUNTER — Encounter: Payer: Self-pay | Admitting: Pulmonary Disease

## 2020-06-13 ENCOUNTER — Ambulatory Visit (INDEPENDENT_AMBULATORY_CARE_PROVIDER_SITE_OTHER): Payer: Medicare Other | Admitting: Pulmonary Disease

## 2020-06-13 ENCOUNTER — Other Ambulatory Visit: Payer: Self-pay

## 2020-06-13 VITALS — BP 130/80 | HR 79 | Temp 97.0°F | Ht 70.0 in | Wt 151.4 lb

## 2020-06-13 DIAGNOSIS — R0602 Shortness of breath: Secondary | ICD-10-CM

## 2020-06-13 DIAGNOSIS — R918 Other nonspecific abnormal finding of lung field: Secondary | ICD-10-CM

## 2020-06-13 DIAGNOSIS — Z8616 Personal history of COVID-19: Secondary | ICD-10-CM

## 2020-06-13 DIAGNOSIS — J9811 Atelectasis: Secondary | ICD-10-CM

## 2020-06-13 DIAGNOSIS — I272 Pulmonary hypertension, unspecified: Secondary | ICD-10-CM

## 2020-06-13 DIAGNOSIS — J302 Other seasonal allergic rhinitis: Secondary | ICD-10-CM | POA: Diagnosis not present

## 2020-06-13 DIAGNOSIS — T17908A Unspecified foreign body in respiratory tract, part unspecified causing other injury, initial encounter: Secondary | ICD-10-CM

## 2020-06-13 NOTE — Patient Instructions (Signed)
Thank you for visiting Dr. Valeta Harms at [facility]. Today we recommend the following:  Orders Placed This Encounter  Procedures  . Pulmonary Function Test   Return in about 4 weeks (around 07/11/2020) for with APP. with PFTs prior to appointment.     Please do your part to reduce the spread of COVID-19.

## 2020-06-13 NOTE — Progress Notes (Signed)
Synopsis: Referred in June 2022 for sob abnormal imaging by [name], MD  Subjective:   PATIENT ID: [name] GENDER: male DOB: 06-22-1926, MRN: [id number]  Chief Complaint  Patient presents with  . Consult    Patient states he had scan on 05/18/20 and was referred here.     This is a 87 year old gentleman, past medical history of atrial fibrillation, coronary artery disease status post stenting, prior CABG in 2013, CKD, prostate cancer status post TURP, retired from 63 years in the [employer] in [place], retired from [employer] in [place].  Complaints today of shortness of breath.  This seems to be episodic in nature.  Additionally he has trouble swallowing at times has had EGD in the past.  Occasionally he gets choked up at times.  Recently had COVID and has been slowly recovering from this.  Still feels short of breath with exertion.  He had a nuclear medicinePET scan completed 05/18/2020 with Dr. Alen Blew.  This revealed no significant recurrence of prostate cancer no evidence of metastasis however did show mild lower lobe consolidation concern for aspiration pneumonitis within the right lower lobe.  He has not had a recent swallow study.  Additionally prior echocardiogram completed last year on 07/07/2019 reveals moderate pulmonary hypertension, grade 2 diastolic dysfunction.  Patient follows with Dr. Burt Knack.   Past Medical History:  Diagnosis Date  . Acute superficial venous thrombosis of lower extremity    a. RLE after CABG, neg dopp for DVT.  [name] Allergy   . Anemia   . Atrial fibrillation ([place])    a. Post-op from CABG, on amiodarone temporarily, d/c'd 12/2011  . CAD (coronary artery disease)    a. S/P stenting to mid RCA, prox PDA 06/1999. b. NSTEMI/CABG x 3 in 10/2011 with LIMA to LAD, SVG to PDA, and SVG to OM1.   . Chronic UTI    a. Followed by Dr. Risa Grill - colonized/asymptomatic - not on abx  . CKD (chronic kidney disease), stage III (HCC)    a. stable with a  creatinine around 1.9-2.0 followed by nephrology.  . Dyslipidemia   . Echocardiogram    Echocardiogram 08/2018: EF 50-55, inf-lat AK, mild LVH, Gr 1 DD, RVSP 47, mild LAE, mild to mod MR, mild AI, mild AS (mean 11)  . GERD (gastroesophageal reflux disease)   . Hypertension    well-controlled.  . Myocardial infarction ([place])   . Neck injury    a. C3-C4 and C4-C5 foraminal narrowing, severe  . Prostate cancer ([place])    a. 2001 s/p TURP.  . Pulmonary nodule    a. felt to be noncancerous.  Status post followup CT scan 4 mm and stable.  . Renal artery stenosis ([place])    a. 50% by cath 2001  . 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     Family History  Problem Relation Age of Onset  . Coronary artery disease Father        died @ 59  . Other Mother        cerebral hemorrhage - died @ 85  . Arthritis Mother   . Cancer Brother        Bladder  . Prostate cancer Brother   . Nephritis Brother        died @ age 93.  . Other Brother        cerebral hemorrhage - died @ 77  . Heart disease Brother   .  Breast cancer Other        niece x 2  . Diabetes Neg Hx   . Colon cancer Neg Hx   . Adrenal disorder Neg Hx      Past Surgical History:  Procedure Laterality Date  . ANTERIOR CHAMBER WASHOUT Left 10/04/2018   Procedure: Anterior Chamber Washout, Vitreous Tap;  Surgeon: Jalene Mullet, MD;  Location: [facility];  Service: Ophthalmology;  Laterality: Left;  . cardia catherization  07-07-99  . CARDIAC SURGERY  10/18/12   open heart surgery  . CATARACT EXTRACTION W/ INTRAOCULAR LENS  IMPLANT, BILATERAL  3/205, 06/2013   [facility]  . COLONOSCOPY  04/12/07  . CORONARY ARTERY BYPASS GRAFT  10/19/2011   Procedure: CORONARY ARTERY BYPASS GRAFTING (CABG);  Surgeon: Gaye Pollack, MD;  Location: [facility];  Service: Open Heart Surgery;  Laterality: N/A;  times three using Left Internal Mammary Artery and Right Greater Saphenouse Vein Graft harvested Endoscopically  . edg   07-17-1994  . FLEXIBLE SIGMOIDOSCOPY  11-03-1997  . GAS INSERTION Left 10/04/2018   Procedure: Insertion Of Gas;  Surgeon: Jalene Mullet, MD;  Location: [facility];  Service: Ophthalmology;  Laterality: Left;  [name] GAS/FLUID EXCHANGE Left 10/04/2018   Procedure: Gas/Fluid Exchange;  Surgeon: Jalene Mullet, MD;  Location: [facility];  Service: Ophthalmology;  Laterality: Left;  . LEFT HEART CATHETERIZATION WITH CORONARY ANGIOGRAM N/A 10/16/2011   Procedure: LEFT HEART CATHETERIZATION WITH CORONARY ANGIOGRAM;  Surgeon: Peter M Martinique, MD;  Location: [facility] CATH LAB;  Service: Cardiovascular;  Laterality: N/A;  . LEFT HEART CATHETERIZATION WITH CORONARY/GRAFT ANGIOGRAM N/A 03/11/2013   Procedure: LEFT HEART CATHETERIZATION WITH [name];  Surgeon: Blane Ohara, MD;  Location: [facility] CATH LAB;  Service: Cardiovascular;  Laterality: N/A;  . lumbar spinal disk and neck fusion surgery    . PACEMAKER INSERTION  03/28/12   PPM implanted for mobitz II AV block  . PARS PLANA VITRECTOMY Left 10/04/2018   Procedure: PARS PLANA VITRECTOMY 25 GAUGE FOR ENDOPHTHALMITIS WITH INJECTION OF INTRAVITREAL ANTIBIOTIC;  Surgeon: Jalene Mullet, MD;  Location: [facility];  Service: Ophthalmology;  Laterality: Left;  . peripheral vascular catherization  11-24-03  . PERMANENT PACEMAKER INSERTION N/A 03/28/2012   Procedure: PERMANENT PACEMAKER INSERTION;  Surgeon: Thompson Grayer, MD;  Location: [facility] CATH LAB;  Service: Cardiovascular;  Laterality: N/A;  . PROSTATECTOMY    . renal circulation  10-01-03  . s/p ptca    . stents     X 2  . stress cardiolite  05-04-05   spring 09-negative except for apical thinning, EF 68%    Social History   Socioeconomic History  . Marital status: Married    Spouse name: [name]  . Number of children: 2  . Years of education: Not on file  . Highest education level: Some college, no degree  Occupational History  . 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 Smoking status: Never Smoker  . Smokeless tobacco: Never Used  Vaping Use  . Vaping Use: Never used  Substance and Sexual Activity  . Alcohol use: Yes    Comment: Rarely  . Drug use: No  . Sexual activity: Not on file  Other Topics Concern  . Not on file  Social History Narrative   HSG, 1 year college.  married '52 - 3 years, divorced;  married '56 - 3 years divorced; married '71-12 yrs - divorced; married '75 -. 1 son '57; 1 daughter - '53; 1 grandchild.  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  Very happily married.  End of life care: yes CPR, no long term mechanical ventilation, no heroic measures.right handed   Right handed   Social Determinants of Health   Financial Resource Strain: Not on file  Food Insecurity: Not on file  Transportation Needs: Not on file  Physical Activity: Not on file  Stress: Not on file  Social Connections: Not on file  Intimate Partner Violence: Not on file     Allergies  Allergen Reactions  . Aspirin Nausea And Vomiting and Other (See Comments)    Upset stomach- tolerates coated aspirin   . Lisinopril Anaphylaxis and Shortness Of Breath    After 3 tablets, he experienced trouble swallowing, throat tightness and hoarseness.   . Amoxicillin Nausea Only  . Atarax [Hydroxyzine Hcl] Nausea And Vomiting  . Cephalexin Diarrhea  . Ciprofloxacin Nausea Only  . Clindamycin Other (See Comments)    Reaction not recalled  . Clobetasol Other (See Comments)    Not effective  . Codeine Nausea Only and Other (See Comments)    Stomach upset  . Fish Allergy Nausea And Vomiting  . Fluarix [Influenza Virus Vaccine] Itching  . Haemophilus Influenzae Itching  . Hydrocodone Nausea Only  . Hydrocodone-Acetaminophen Nausea And Vomiting  . Hydroxyzine Nausea And Vomiting  . Latex Itching and Other  (See Comments)    (After flu shot)  . Macrobid [Nitrofurantoin Macrocrystal] Nausea Only  . Niacin Other (See Comments)    Unknown reaction  . Niacin-Lovastatin Er Other (See Comments)    Unsure of reaction. Taking simvastatin at home without problems  . Niacin-Lovastatin Er Other (See Comments)    Reaction not recalled  . Nitrofurantoin Other (See Comments)    Upset stomach   . Omeprazole Other (See Comments)    Reaction not recalled- stopped by MD  . Other Nausea And Vomiting  . Vibramycin [Doxycycline] Other (See Comments)    Reaction not recalled  . Adhesive [Tape] Rash  . Bactrim [Sulfamethoxazole-Trimethoprim] Rash  . Colchicine Rash  . Gabapentin     Painful pimples on tongue  . Nortriptyline Rash     Outpatient Medications Prior to Visit  Medication Sig Dispense Refill  . acetaminophen (TYLENOL) 500 MG tablet Take 500 mg by mouth every 6 (six) hours as needed.    [name] allopurinol (ZYLOPRIM) 100 MG tablet TAKE 1 TABLET(100 MG) BY MOUTH DAILY 90 tablet 1  . amLODipine (NORVASC) 5 MG tablet Take 1 tablet (5 mg total) by mouth daily. 90 tablet 2  . Aromatic Inhalants (VICKS VAPOR INHALER IN) Place 1 puff into both nostrils as needed (for congestion).    [name] aspirin EC 81 MG tablet Take 81 mg by mouth daily.    . Calcium Citrate-Vitamin D (CITRACAL + D PO) Take 2 tablets by mouth in the morning and at bedtime.    . Carboxymeth-Glycerin-Polysorb (REFRESH OPTIVE MEGA-3 OP) Place 1 drop into both eyes 2 (two) times daily.    [name] epoetin alfa-epbx (RETACRIT) 2000 UNIT/ML injection 2,000 Units every 14 (fourteen) days. Patient states he only gets it when he needs it but not every 2 weeks    . famotidine (PEPCID) 20 MG tablet Take 1 tablet (20 mg total) by mouth at bedtime. 30 tablet 0  . folic acid (FOLVITE) 1 MG  tablet Take 1 tablet (1 mg total) by mouth daily. Annual appt due in May must see provider for future refills 90 tablet 0  . furosemide (LASIX) 40 MG tablet Take 1 tablet (40 mg  total) by mouth daily. 30 tablet 0  . HYDROcodone bit-homatropine (HYDROMET) 5-1.5 MG/5ML syrup Take 5 mLs by mouth every 6 (six) hours as needed for cough. 120 mL 0  . leuprolide, 6 Month, (ELIGARD) 45 MG injection Inject 45 mg into the skin every 6 (six) months.    . loratadine (CLARITIN) 10 MG tablet Take 10 mg by mouth daily as needed (for seasonal allergies).    . nitroGLYCERIN (NITROSTAT) 0.4 MG SL tablet DISSOLVE 1 TABLET UNDER THE TONGUE EVERY 5 MINUTES AS NEEDED FOR CHEST PAIN, MAXIMUM 3 TABLETS 100 tablet 1  . nortriptyline (PAMELOR) 10 MG capsule Take 1 capsule (10 mg total) by mouth at bedtime. 30 capsule 5  . vitamin C (ASCORBIC ACID) 500 MG tablet Take 1,000 mg by mouth daily.     . Vitamin D, Ergocalciferol, (DRISDOL) 1.25 MG (50000 UT) CAPS capsule Take 50,000 Units by mouth every 30 (thirty) days.      Facility-Administered Medications Prior to Visit  Medication Dose Route Frequency Provider Last Rate Last Admin  . cyanocobalamin ((VITAMIN B-12)) injection 1,000 mcg  1,000 mcg Intramuscular Q30 days [name], MD   1,000 mcg at 05/18/20 0953  . cyanocobalamin ((VITAMIN B-12)) injection 1,000 mcg  1,000 mcg Intramuscular Once Burns, Claudina Lick, MD      . NON FORMULARY 1 application  1 application Topical PRN Landis Martins, DPM        Review of Systems  Constitutional: Negative for chills, fever, malaise/fatigue and weight loss.  HENT: Negative for hearing loss, sore throat and tinnitus.   Eyes: Negative for blurred vision and double vision.  Respiratory: Positive for shortness of breath. Negative for cough, hemoptysis, sputum production, wheezing and stridor.   Cardiovascular: Negative for chest pain, palpitations, orthopnea, leg swelling and PND.  Gastrointestinal: Negative for abdominal pain, constipation, diarrhea, heartburn, nausea and vomiting.  Genitourinary: Negative for dysuria, hematuria and urgency.  Musculoskeletal: Negative for joint pain and myalgias.  Skin:  Negative for itching and rash.  Neurological: Negative for dizziness, tingling, weakness and headaches.  Endo/Heme/Allergies: Negative for environmental allergies. Does not bruise/bleed easily.  Psychiatric/Behavioral: Negative for depression. The patient is not nervous/anxious and does not have insomnia.   All other systems reviewed and are negative.    Objective:  Physical Exam Vitals reviewed.  Constitutional:      General: He is not in acute distress.    Appearance: He is well-developed.  HENT:     Head: Normocephalic and atraumatic.  Eyes:     General: No scleral icterus.    Conjunctiva/sclera: Conjunctivae normal.     Pupils: Pupils are equal, round, and reactive to light.  Neck:     Vascular: No JVD.     Trachea: No tracheal deviation.  Cardiovascular:     Rate and Rhythm: Normal rate and regular rhythm.     Heart sounds: Murmur heard.    Pulmonary:     Effort: Pulmonary effort is normal. No tachypnea, accessory muscle usage or respiratory distress.     Breath sounds: Normal breath sounds. No stridor. No wheezing, rhonchi or rales.  Abdominal:     General: Bowel sounds are normal. There is no distension.     Palpations: Abdomen is soft.     Tenderness: There is no  abdominal tenderness.  Musculoskeletal:        General: No tenderness.     Cervical back: Neck supple.  Lymphadenopathy:     Cervical: No cervical adenopathy.  Skin:    General: Skin is warm and dry.     Capillary Refill: Capillary refill takes less than 2 seconds.     Findings: No rash.  Neurological:     Mental Status: He is alert and oriented to person, place, and time.  Psychiatric:        Behavior: Behavior normal.      Vitals:   06/13/20 1533  BP: 130/80  Pulse: 79  Temp: (!) 97 F (36.1 C)  TempSrc: Temporal  SpO2: 99%  Weight: 151 lb 6.4 oz (68.7 kg)  Height: 5\' 10"  (1.778 m)   99% on RA BMI Readings from Last 3 Encounters:  06/13/20 21.72 kg/m  05/23/20 21.37 kg/m   05/19/20 21.38 kg/m   Wt Readings from Last 3 Encounters:  06/13/20 151 lb 6.4 oz (68.7 kg)  05/23/20 153 lb 3.5 oz (69.5 kg)  05/19/20 153 lb 4.8 oz (69.5 kg)     CBC    Component Value Date/Time   WBC 7.5 05/23/2020 1154   RBC 4.84 05/23/2020 1154   HGB 13.3 06/01/2020 1239   HGB 11.3 (L) 05/19/2020 1433   HCT 39.5 05/23/2020 1154   PLT 350 05/23/2020 1154   PLT 217 05/19/2020 1433   MCV 81.6 05/23/2020 1154   MCH 25.6 (L) 05/23/2020 1154   MCHC 31.4 05/23/2020 1154   RDW 18.4 (H) 05/23/2020 1154   LYMPHSABS 1.2 05/23/2020 1154   MONOABS 0.9 05/23/2020 1154   EOSABS 0.1 05/23/2020 1154   BASOSABS 0.1 05/23/2020 1154    Chest Imaging: 05/18/2020 nuclear medicine pet image: CT image windows of the lungs reviewed. Lower lobe areas of infiltrate and groundglass likely consistent with basilar atelectasis, dependent atelectasis possible infiltrate. The patient's images have been independently reviewed by me.    Pulmonary Functions Testing Results: No flowsheet data found.  FeNO:   Pathology:   Echocardiogram:   Heart Catheterization:     Assessment & Plan:     ICD-10-CM   1. SOB (shortness of breath)  R06.02 Pulmonary Function Test  2. Abnormal CT scan of lung  R91.8   3. Atelectasis  J98.11   4. Aspiration into airway, initial encounter  T17.908A   5. Seasonal allergies  J30.2   6. History of COVID-19  Z86.16   7. Pulmonary hypertension ([place])  I27.20     Discussion: This is a 87 year old gentleman with multifactorial etiology for shortness of breath.  He had a abnormal CT lung imaging on his nuclear medicine PET with lower lobe infiltrate either consistent with pneumonitis from aspiration versus dependent atelectasis.  He has seasonal allergies, recurrent sinus symptoms, recent history of COVID-19 and has been recovering from this.  He also has coronary disease, valvular disease and an echo showing moderate pulmonary hypertension.  Overall, I think he has  multiple reasons to feel short of breath when he exerts himself.  Plan: Today in the office we discussed all of these potential etiologies for his symptoms. He has not had pulmonary function tests in the past.  I think it is reasonable as he does have a good functional status from a physical standpoint at his baseline.  Even despite his age.  Patient returns to review pulmonary function tests once they are complete. I will reach out to Dr. Quay Burow as well  as Dr. Burt Knack.  We appreciate the consultation.   Current Outpatient Medications:  .  acetaminophen (TYLENOL) 500 MG tablet, Take 500 mg by mouth every 6 (six) hours as needed., Disp: , Rfl:  .  allopurinol (ZYLOPRIM) 100 MG tablet, TAKE 1 TABLET(100 MG) BY MOUTH DAILY, Disp: 90 tablet, Rfl: 1 .  amLODipine (NORVASC) 5 MG tablet, Take 1 tablet (5 mg total) by mouth daily., Disp: 90 tablet, Rfl: 2 .  Aromatic Inhalants (VICKS VAPOR INHALER IN), Place 1 puff into both nostrils as needed (for congestion)., Disp: , Rfl:  .  aspirin EC 81 MG tablet, Take 81 mg by mouth daily., Disp: , Rfl:  .  Calcium Citrate-Vitamin D (CITRACAL + D PO), Take 2 tablets by mouth in the morning and at bedtime., Disp: , Rfl:  .  Carboxymeth-Glycerin-Polysorb (REFRESH OPTIVE MEGA-3 OP), Place 1 drop into both eyes 2 (two) times daily., Disp: , Rfl:  .  epoetin alfa-epbx (RETACRIT) 2000 UNIT/ML injection, 2,000 Units every 14 (fourteen) days. Patient states he only gets it when he needs it but not every 2 weeks, Disp: , Rfl:  .  famotidine (PEPCID) 20 MG tablet, Take 1 tablet (20 mg total) by mouth at bedtime., Disp: 30 tablet, Rfl: 0 .  folic acid (FOLVITE) 1 MG tablet, Take 1 tablet (1 mg total) by mouth daily. Annual appt due in May must see provider for future refills, Disp: 90 tablet, Rfl: 0 .  furosemide (LASIX) 40 MG tablet, Take 1 tablet (40 mg total) by mouth daily., Disp: 30 tablet, Rfl: 0 .  HYDROcodone bit-homatropine (HYDROMET) 5-1.5 MG/5ML syrup, Take 5  mLs by mouth every 6 (six) hours as needed for cough., Disp: 120 mL, Rfl: 0 .  leuprolide, 6 Month, (ELIGARD) 45 MG injection, Inject 45 mg into the skin every 6 (six) months., Disp: , Rfl:  .  loratadine (CLARITIN) 10 MG tablet, Take 10 mg by mouth daily as needed (for seasonal allergies)., Disp: , Rfl:  .  nitroGLYCERIN (NITROSTAT) 0.4 MG SL tablet, DISSOLVE 1 TABLET UNDER THE TONGUE EVERY 5 MINUTES AS NEEDED FOR CHEST PAIN, MAXIMUM 3 TABLETS, Disp: 100 tablet, Rfl: 1 .  nortriptyline (PAMELOR) 10 MG capsule, Take 1 capsule (10 mg total) by mouth at bedtime., Disp: 30 capsule, Rfl: 5 .  vitamin C (ASCORBIC ACID) 500 MG tablet, Take 1,000 mg by mouth daily. , Disp: , Rfl:  .  Vitamin D, Ergocalciferol, (DRISDOL) 1.25 MG (50000 UT) CAPS capsule, Take 50,000 Units by mouth every 30 (thirty) days. , Disp: , Rfl:   Current Facility-Administered Medications:  .  cyanocobalamin ((VITAMIN B-12)) injection 1,000 mcg, 1,000 mcg, Intramuscular, Q30 days, [name], MD, 1,000 mcg at 05/18/20 0953 .  cyanocobalamin ((VITAMIN B-12)) injection 1,000 mcg, 1,000 mcg, Intramuscular, Once, Burns, Claudina Lick, MD .  NON FORMULARY 1 application, 1 application, Topical, PRN, Landis Martins, DPM    Garner Nash, DO [facility] 06/13/2020 3:41 PM

## 2020-06-15 ENCOUNTER — Encounter (HOSPITAL_COMMUNITY)
Admission: RE | Admit: 2020-06-15 | Discharge: 2020-06-15 | Disposition: A | Discharge status: Home / Self Care | Payer: Medicare Other | Source: Ambulatory Visit | Attending: Nephrology | Admitting: Nephrology

## 2020-06-15 ENCOUNTER — Other Ambulatory Visit: Payer: Self-pay

## 2020-06-15 ENCOUNTER — Encounter (HOSPITAL_COMMUNITY): Payer: Self-pay

## 2020-06-15 VITALS — BP 150/74 | HR 85 | Temp 97.5°F | Resp 18

## 2020-06-15 DIAGNOSIS — N183 Chronic kidney disease, stage 3 unspecified: Secondary | ICD-10-CM | POA: Insufficient documentation

## 2020-06-15 DIAGNOSIS — D631 Anemia in chronic kidney disease: Secondary | ICD-10-CM | POA: Diagnosis not present

## 2020-06-15 LAB — IRON AND TIBC
Iron: 71 ug/dL (ref 45–182)
Saturation Ratios: 26 % (ref 17.9–39.5)
TIBC: 270 ug/dL (ref 250–450)
UIBC: 199 ug/dL

## 2020-06-15 LAB — POCT HEMOGLOBIN-HEMACUE: Hemoglobin: 11.9 g/dL — ABNORMAL LOW (ref 13.0–17.0)

## 2020-06-15 LAB — FERRITIN: Ferritin: 240 ng/mL (ref 24–336)

## 2020-06-15 MED ORDER — EPOETIN ALFA-EPBX 10000 UNIT/ML IJ SOLN
INTRAMUSCULAR | Status: AC
  Filled 2020-06-15: qty 2

## 2020-06-15 MED ORDER — EPOETIN ALFA-EPBX 10000 UNIT/ML IJ SOLN
20000.0000 [IU] | INTRAMUSCULAR | Status: DC
  Administered 2020-06-15: 20000 [IU] via SUBCUTANEOUS

## 2020-06-20 ENCOUNTER — Ambulatory Visit (INDEPENDENT_AMBULATORY_CARE_PROVIDER_SITE_OTHER): Payer: Medicare Other

## 2020-06-20 ENCOUNTER — Other Ambulatory Visit: Payer: Self-pay

## 2020-06-20 DIAGNOSIS — E538 Deficiency of other specified B group vitamins: Secondary | ICD-10-CM

## 2020-06-20 MED ORDER — CYANOCOBALAMIN 1000 MCG/ML IJ SOLN
1000.0000 ug | Freq: Once | INTRAMUSCULAR | Status: AC
  Administered 2020-06-20: 1000 ug via INTRAMUSCULAR

## 2020-06-20 NOTE — Progress Notes (Signed)
Pt here for monthly B12 injection per Dr. Burns  B12 1000mcg given IM, and pt tolerated injection well.  Next B12 injection scheduled for 07/20/20  

## 2020-06-22 ENCOUNTER — Encounter: Payer: Self-pay | Admitting: Cardiovascular Disease

## 2020-06-22 ENCOUNTER — Other Ambulatory Visit: Payer: Self-pay

## 2020-06-22 ENCOUNTER — Ambulatory Visit (INDEPENDENT_AMBULATORY_CARE_PROVIDER_SITE_OTHER): Payer: Medicare Other | Admitting: Cardiovascular Disease

## 2020-06-22 ENCOUNTER — Ambulatory Visit (HOSPITAL_COMMUNITY): Payer: Medicare Other | Attending: Internal Medicine

## 2020-06-22 VITALS — BP 142/82 | HR 86 | Ht 70.0 in | Wt 150.2 lb

## 2020-06-22 DIAGNOSIS — I340 Nonrheumatic mitral (valve) insufficiency: Secondary | ICD-10-CM | POA: Diagnosis present

## 2020-06-22 DIAGNOSIS — I350 Nonrheumatic aortic (valve) stenosis: Secondary | ICD-10-CM

## 2020-06-22 DIAGNOSIS — N1832 Chronic kidney disease, stage 3b: Secondary | ICD-10-CM

## 2020-06-22 DIAGNOSIS — I10 Essential (primary) hypertension: Secondary | ICD-10-CM

## 2020-06-22 DIAGNOSIS — I2510 Atherosclerotic heart disease of native coronary artery without angina pectoris: Secondary | ICD-10-CM | POA: Diagnosis not present

## 2020-06-22 LAB — ECHOCARDIOGRAM COMPLETE
AR max vel: 1.49 cm2
AV Area VTI: 1.5 cm2
AV Area mean vel: 1.25 cm2
AV Mean grad: 15.5 mmHg
AV Peak grad: 28.4 mmHg
Ao pk vel: 2.67 m/s
Area-P 1/2: 3.05 cm2
P 1/2 time: 391 msec
S' Lateral: 3.5 cm

## 2020-06-22 NOTE — Patient Instructions (Signed)

## 2020-06-22 NOTE — Progress Notes (Signed)
Cardiology Office Note:    Date:  06/22/2020   ID:  [name], DOB 05-10-1926, MRN [id number]  PCP:  [name], MD   [facility] Providers Cardiologist:  [name], MD     Referring MD: [name], MD   Chief Complaint  Patient presents with   Shortness of Breath     History of Present Illness:    [name] is a 87 y.o. male with a hx of  coronary artery disease, chronic kidney disease, and mild aortic stenosis, presenting for follow-up evaluation.  The patient has a history of RCA stenting.  He ultimately was treated with multivessel CABG in 2013 with early graft failure of all of his venous conduits and continued patency of the mammary artery to LAD graft.  He had a nuclear stress test in 2019 demonstrating no significant ischemia.  Comorbid medical conditions include chronic diastolic heart failure, symptomatic bradycardia status post permanent pacemaker, chronic kidney disease stage 3, mixed hyperlipidemia, hypertension, prostate cancer, and chronic anemia.  The patient is here with his wife today.  He describes good days and bad days.  He states that some days he gets out of bed and feels pretty exhausted with limited ability to do his activities of daily living.  Other days he feels pretty well and can do quite a bit.  He has chronic exertional dyspnea without significant change over the past 6 months.  He denies orthopnea, PND, or leg swelling.  He has had no chest pain or pressure.  He denies heart palpitations.  Past Medical History:  Diagnosis Date   Acute superficial venous thrombosis of lower extremity    a. RLE after CABG, neg dopp for DVT.   Allergy    Anemia    Atrial fibrillation ([place])    a. Post-op from CABG, on amiodarone temporarily, d/c'd 12/2011   CAD (coronary artery disease)    a. S/P stenting to mid RCA, prox PDA 06/1999. b. NSTEMI/CABG x 3 in 10/2011 with LIMA to LAD, SVG to PDA, and SVG to OM1.    Chronic UTI    a.  Followed by Dr. Risa Grill - colonized/asymptomatic - not on abx   CKD (chronic kidney disease), stage III ([place])    a. stable with a creatinine around 1.9-2.0 followed by nephrology.   Dyslipidemia    Echocardiogram    Echocardiogram 08/2018: EF 50-55, inf-lat AK, mild LVH, Gr 1 DD, RVSP 47, mild LAE, mild to mod MR, mild AI, mild AS (mean 11)   GERD (gastroesophageal reflux disease)    Hypertension    well-controlled.   Myocardial infarction ([name])    Neck injury    a. C3-C4 and C4-C5 foraminal narrowing, severe   Prostate cancer ([name])    a. 2001 s/p TURP.   Pulmonary nodule    a. felt to be noncancerous.  Status post followup CT scan 4 mm and stable.   Renal artery stenosis ([facility])    a. 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    Past Surgical History:  Procedure Laterality Date   ANTERIOR CHAMBER WASHOUT Left 10/04/2018   Procedure: Anterior Chamber Washout, Vitreous Tap;  Surgeon: Jalene Mullet, MD;  Location: [place];  Service: Ophthalmology;  Laterality: Left;   cardia catherization  07-07-99   CARDIAC SURGERY  10/18/12   open heart surgery   CATARACT EXTRACTION W/ INTRAOCULAR LENS  IMPLANT, BILATERAL  3/205, 06/2013  [facility]   COLONOSCOPY  04/12/07   CORONARY ARTERY BYPASS GRAFT  10/19/2011   Procedure: CORONARY ARTERY BYPASS GRAFTING (CABG);  Surgeon: Gaye Pollack, MD;  Location: [place];  Service: Open Heart Surgery;  Laterality: N/A;  times three using Left Internal Mammary Artery and Right Greater [place] Vein Graft harvested Endoscopically   edg  07-17-1994   FLEXIBLE SIGMOIDOSCOPY  11-03-1997   GAS INSERTION Left 10/04/2018   Procedure: Insertion Of Gas;  Surgeon: Jalene Mullet, MD;  Location: [place];  Service: Ophthalmology;  Laterality: Left;   GAS/FLUID EXCHANGE Left 10/04/2018   Procedure: Gas/Fluid Exchange;  Surgeon: Jalene Mullet, MD;  Location: [place];  Service: Ophthalmology;  Laterality: Left;   LEFT HEART  CATHETERIZATION WITH CORONARY ANGIOGRAM N/A 10/16/2011   Procedure: LEFT HEART CATHETERIZATION WITH CORONARY ANGIOGRAM;  Surgeon: Peter M Martinique, MD;  [facility];  Service: Cardiovascular;  Laterality: N/A;   LEFT HEART CATHETERIZATION WITH CORONARY/GRAFT ANGIOGRAM N/A 03/11/2013   Procedure: LEFT HEART CATHETERIZATION WITH [name];  Surgeon: Blane Ohara, MD;  Location: [facility] CATH LAB;  Service: Cardiovascular;  Laterality: N/A;   lumbar spinal disk and neck fusion surgery     PACEMAKER INSERTION  03/28/12   PPM implanted for mobitz II AV block   PARS PLANA VITRECTOMY Left 10/04/2018   Procedure: PARS PLANA VITRECTOMY 25 GAUGE FOR ENDOPHTHALMITIS WITH INJECTION OF INTRAVITREAL ANTIBIOTIC;  Surgeon: Jalene Mullet, MD;  Location: [place];  Service: Ophthalmology;  Laterality: Left;   peripheral vascular catherization  11-24-03   PERMANENT PACEMAKER INSERTION N/A 03/28/2012   Procedure: PERMANENT PACEMAKER INSERTION;  Surgeon: Thompson Grayer, MD;  Location: [facility] CATH LAB;  Service: Cardiovascular;  Laterality: N/A;   PROSTATECTOMY     renal circulation  10-01-03   s/p ptca     stents     X 2   stress cardiolite  05-04-05   spring 09-negative except for apical thinning, EF 68%    Current Medications: Current Meds  Medication Sig   acetaminophen (TYLENOL) 500 MG tablet Take 500 mg by mouth every 6 (six) hours as needed.   allopurinol (ZYLOPRIM) 100 MG tablet TAKE 1 TABLET(100 MG) BY MOUTH DAILY   amLODipine (NORVASC) 5 MG tablet Take 1 tablet (5 mg total) by mouth daily.   Aromatic Inhalants (VICKS VAPOR INHALER IN) Place 1 puff into both nostrils as needed (for congestion).   aspirin EC 81 MG tablet Take 81 mg by mouth daily.   Calcium Citrate-Vitamin D (CITRACAL + D PO) Take 2 tablets by mouth in the morning and at bedtime.   Carboxymeth-Glycerin-Polysorb (REFRESH OPTIVE MEGA-3 OP) Place 1 drop into both eyes 2 (two) times daily.   epoetin alfa-epbx (RETACRIT) 2000  UNIT/ML injection 2,000 Units every 14 (fourteen) days. Patient states he only gets it when he needs it but not every 2 weeks   famotidine (PEPCID) 20 MG tablet Take 1 tablet (20 mg total) by mouth at bedtime.   folic acid (FOLVITE) 1 MG tablet Take 1 tablet (1 mg total) by mouth daily. Annual appt due in May must see provider for future refills   furosemide (LASIX) 40 MG tablet Take 1 tablet (40 mg total) by mouth daily.   leuprolide, 6 Month, (ELIGARD) 45 MG injection Inject 45 mg into the skin every 6 (six) months.   loratadine (CLARITIN) 10 MG tablet Take 10 mg by mouth daily as needed (for seasonal allergies).   nitroGLYCERIN (NITROSTAT) 0.4 MG SL tablet DISSOLVE 1 TABLET UNDER  THE TONGUE EVERY 5 MINUTES AS NEEDED FOR CHEST PAIN, MAXIMUM 3 TABLETS   nortriptyline (PAMELOR) 10 MG capsule Take 1 capsule (10 mg total) by mouth at bedtime.   vitamin C (ASCORBIC ACID) 500 MG tablet Take 1,000 mg by mouth daily.    Vitamin D, Ergocalciferol, (DRISDOL) 1.25 MG (50000 UT) CAPS capsule Take 50,000 Units by mouth every 30 (thirty) days.    Current Facility-Administered Medications for the 06/22/20 encounter (Office Visit) with [name], MD  Medication   cyanocobalamin ((VITAMIN B-12)) injection 1,000 mcg   cyanocobalamin ((VITAMIN B-12)) injection 1,000 mcg   NON FORMULARY 1 application     Allergies:   Aspirin, Lisinopril, Amoxicillin, Atarax [hydroxyzine hcl], Cephalexin, Ciprofloxacin, Clindamycin, Clobetasol, Codeine, Fish allergy, Fluarix [influenza virus vaccine], Haemophilus influenzae, Hydrocodone, Hydrocodone-acetaminophen, Hydroxyzine, Latex, Macrobid [nitrofurantoin macrocrystal], Niacin, Niacin-lovastatin er, Niacin-lovastatin er, Nitrofurantoin, Omeprazole, Other, Vibramycin [doxycycline], Adhesive [tape], Bactrim [sulfamethoxazole-trimethoprim], Colchicine, Gabapentin, and Nortriptyline   Social History   Socioeconomic History   Marital status: Married    Spouse name: [name]    Number of children: 2   Years of education: Not on file   Highest education level: Some college, no degree  Occupational History   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  married '52 - 3 years, divorced; married '56 - 3 years divorced; married '58-12 yrs - divorced; married '75 -. 1 son '57; 1 daughter - '53; 1 grandchild.  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  Very happily married.  End of life care: yes CPR, no long term mechanical ventilation, no heroic measures.right handed   Right handed   Social Determinants of Health   Financial Resource Strain: Not on file  Food Insecurity: Not on file  Transportation Needs: Not on file  Physical Activity: Not on file  Stress: Not on file  Social Connections: Not on file     Family History: The patient's family history includes Arthritis in his mother; Breast cancer in an other family member; Cancer in his brother; Coronary artery disease in his father; Heart disease in his brother; Nephritis in his brother; Other in his brother and mother; Prostate cancer in his brother. There is no history of Diabetes, Colon cancer, or Adrenal disorder.  ROS:   Please see the history of present illness.    All other systems reviewed and are negative.  EKGs/Labs/Other Studies Reviewed:    The following studies were reviewed today: Today's echo study is reviewed.  The aortic valve is moderately calcified and restricted.  LV function appears in the normal range.   Doppler findings suggest moderate aortic stenosis with a mean transaortic velocity less than 20 mmHg.  EKG:  EKG is not ordered today.   Recent Labs: 05/19/2020: ALT 9 05/23/2020: B Natriuretic Peptide 415.0; BUN 42; Creatinine, Ser 2.15; Platelets 350; Potassium 3.9; Sodium 136 06/15/2020: Hemoglobin 11.9  Recent Lipid Panel    Component Value Date/Time  CHOL 122 09/04/2016 1123   TRIG 59.0 09/04/2016 1123   TRIG 107 01/16/2006 1338   HDL 40.40 09/04/2016 1123   CHOLHDL 3 09/04/2016 1123   VLDL 11.8 09/04/2016 1123   LDLCALC 70 09/04/2016 1123     Risk Assessment/Calculations:       Physical Exam:    VS:  BP (!) 142/82   Pulse 86   Ht 5\' 10"  (1.778 m)   Wt 150 lb 3.2 oz (68.1 kg)   SpO2 98%   BMI 21.55 kg/m     Wt Readings from Last 3 Encounters:  06/22/20 150 lb 3.2 oz (68.1 kg)  06/13/20 151 lb 6.4 oz (68.7 kg)  05/23/20 153 lb 3.5 oz (69.5 kg)     GEN:  Well nourished, well developed elderly male in no acute distress HEENT: Normal NECK: No JVD; No carotid bruits LYMPHATICS: No lymphadenopathy CARDIAC: RRR, 2/6 harsh late peaking systolic crescendo decrescendo murmur at the right upper sternal border with diminished A2 RESPIRATORY:  Clear to auscultation without rales, wheezing or rhonchi  ABDOMEN: Soft, non-tender, non-distended MUSCULOSKELETAL:  No edema; No deformity  SKIN: Warm and dry NEUROLOGIC:  Alert and oriented x 3 PSYCHIATRIC:  Normal affect   ASSESSMENT:    1. Aortic valve stenosis, etiology of cardiac valve disease unspecified   2. Coronary artery disease involving native coronary artery of native heart without angina pectoris   3. Mitral valve insufficiency, unspecified etiology   4. Stage 3b chronic kidney disease ([place])   5. Essential hypertension    PLAN:    In order of problems listed above:  The patient's echocardiogram suggests moderate aortic stenosis based on 2D imaging and Doppler findings.  The dimensionless index is greater than  0.3.  The mean transaortic velocity is less than 20 mmHg but the valve clearly appears at least moderately calcified and restricted and his exam suggests at least moderate aortic stenosis.  We will continue with annual echo surveillance.  Significant challenges exist with considering TAVR in the future if his aortic stenosis progresses into the severe range.  The primary issue relates to his chronic kidney disease and risk of AKI/progressive renal disease with radiocontrast studies. No anginal symptoms.  Patient will continue current medical therapy. The patient's echocardiogram is reviewed and shows only mild to moderate mitral regurgitation on today's study. Creatinine has trended upward most recently assessed at 2.15 with an estimated GFR of 28, consistent with stage IV chronic kidney disease Blood pressure well controlled on current medical therapy  Overall the patient appears clinically stable from a cardiac perspective.  I will see him back in 6 months.  He continues to undergo pulmonary evaluation and has upcoming pulmonary function testing scheduled.     Medication Adjustments/Labs and Tests Ordered: Current medicines are reviewed at length with the patient today.  Concerns regarding medicines are outlined above.  No orders of the defined types were placed in this encounter.  No orders of the defined types were placed in this encounter.   There are no Patient Instructions on file for this visit.   Signed, [name], MD  06/22/2020 4:03 PM    [facility]

## 2020-06-23 ENCOUNTER — Ambulatory Visit: Payer: Medicare Other | Admitting: Neurology

## 2020-06-29 ENCOUNTER — Other Ambulatory Visit: Payer: Self-pay

## 2020-06-29 ENCOUNTER — Encounter (HOSPITAL_COMMUNITY)
Admission: RE | Admit: 2020-06-29 | Discharge: 2020-06-29 | Disposition: A | Discharge status: Home / Self Care | Payer: Medicare Other | Source: Ambulatory Visit | Attending: Nephrology | Admitting: Nephrology

## 2020-06-29 VITALS — BP 130/73 | HR 85

## 2020-06-29 DIAGNOSIS — N183 Chronic kidney disease, stage 3 unspecified: Secondary | ICD-10-CM

## 2020-06-29 LAB — POCT HEMOGLOBIN-HEMACUE: Hemoglobin: 12.5 g/dL — ABNORMAL LOW (ref 13.0–17.0)

## 2020-06-29 MED ORDER — EPOETIN ALFA-EPBX 10000 UNIT/ML IJ SOLN: 20000.0000 [IU] | INTRAMUSCULAR | Status: DC

## 2020-07-03 ENCOUNTER — Other Ambulatory Visit: Payer: Self-pay | Admitting: Internal Medicine

## 2020-07-12 ENCOUNTER — Ambulatory Visit (INDEPENDENT_AMBULATORY_CARE_PROVIDER_SITE_OTHER): Payer: Medicare Other

## 2020-07-12 ENCOUNTER — Other Ambulatory Visit (HOSPITAL_COMMUNITY)
Admission: RE | Admit: 2020-07-12 | Discharge: 2020-07-12 | Disposition: A | Discharge status: Home / Self Care | Payer: Medicare Other | Source: Ambulatory Visit | Attending: Pulmonary Disease | Admitting: Pulmonary Disease

## 2020-07-12 DIAGNOSIS — I442 Atrioventricular block, complete: Secondary | ICD-10-CM | POA: Diagnosis not present

## 2020-07-12 DIAGNOSIS — Z20822 Contact with and (suspected) exposure to covid-19: Secondary | ICD-10-CM | POA: Diagnosis not present

## 2020-07-12 DIAGNOSIS — Z01812 Encounter for preprocedural laboratory examination: Secondary | ICD-10-CM | POA: Diagnosis present

## 2020-07-12 LAB — CUP PACEART REMOTE DEVICE CHECK
Battery Impedance: 806 Ohm
Battery Remaining Longevity: 77 mo
Battery Voltage: 2.78 V
Brady Statistic AP VP Percent: 0 %
Brady Statistic AP VS Percent: 13 %
Brady Statistic AS VP Percent: 1 %
Brady Statistic AS VS Percent: 87 %
Date Time Interrogation Session: 20220705083557
Implantable Lead Implant Date: 20140321
Implantable Lead Implant Date: 20140321
Implantable Lead Location: 753859
Implantable Lead Location: 753860
Implantable Lead Model: 5076
Implantable Lead Model: 5092
Implantable Pulse Generator Implant Date: 20140321
Lead Channel Impedance Value: 417 Ohm
Lead Channel Impedance Value: 767 Ohm
Lead Channel Pacing Threshold Amplitude: 0.625 V
Lead Channel Pacing Threshold Amplitude: 0.875 V
Lead Channel Pacing Threshold Pulse Width: 0.4 ms
Lead Channel Pacing Threshold Pulse Width: 0.4 ms
Lead Channel Setting Pacing Amplitude: 2 V
Lead Channel Setting Pacing Amplitude: 2.5 V
Lead Channel Setting Pacing Pulse Width: 0.4 ms
Lead Channel Setting Sensing Sensitivity: 5.6 mV

## 2020-07-12 LAB — SARS CORONAVIRUS 2 (TAT 6-24 HRS): SARS Coronavirus 2: NEGATIVE

## 2020-07-13 ENCOUNTER — Encounter (HOSPITAL_COMMUNITY)
Admission: RE | Admit: 2020-07-13 | Discharge: 2020-07-13 | Disposition: A | Discharge status: Home / Self Care | Payer: Medicare Other | Source: Ambulatory Visit | Attending: Nephrology | Admitting: Nephrology

## 2020-07-13 ENCOUNTER — Other Ambulatory Visit: Payer: Self-pay

## 2020-07-13 ENCOUNTER — Encounter (HOSPITAL_COMMUNITY): Payer: Medicare Other

## 2020-07-13 VITALS — BP 141/81 | HR 78 | Temp 98.2°F | Resp 19

## 2020-07-13 DIAGNOSIS — N183 Chronic kidney disease, stage 3 unspecified: Secondary | ICD-10-CM | POA: Insufficient documentation

## 2020-07-13 DIAGNOSIS — D631 Anemia in chronic kidney disease: Secondary | ICD-10-CM | POA: Diagnosis not present

## 2020-07-13 LAB — IRON AND TIBC
Iron: 83 ug/dL (ref 45–182)
Saturation Ratios: 29 % (ref 17.9–39.5)
TIBC: 288 ug/dL (ref 250–450)
UIBC: 205 ug/dL

## 2020-07-13 LAB — FERRITIN: Ferritin: 223 ng/mL (ref 24–336)

## 2020-07-13 LAB — POCT HEMOGLOBIN-HEMACUE: Hemoglobin: 12.5 g/dL — ABNORMAL LOW (ref 13.0–17.0)

## 2020-07-13 MED ORDER — EPOETIN ALFA-EPBX 10000 UNIT/ML IJ SOLN: 20000.0000 [IU] | INTRAMUSCULAR | Status: DC

## 2020-07-15 ENCOUNTER — Encounter: Payer: Self-pay | Admitting: Neurology

## 2020-07-15 ENCOUNTER — Encounter: Payer: Self-pay | Admitting: Pulmonary Disease

## 2020-07-15 ENCOUNTER — Ambulatory Visit (INDEPENDENT_AMBULATORY_CARE_PROVIDER_SITE_OTHER): Payer: Medicare Other | Admitting: Pulmonary Disease

## 2020-07-15 ENCOUNTER — Ambulatory Visit (INDEPENDENT_AMBULATORY_CARE_PROVIDER_SITE_OTHER): Payer: Medicare Other | Admitting: Neurology

## 2020-07-15 ENCOUNTER — Ambulatory Visit: Payer: Medicare Other

## 2020-07-15 ENCOUNTER — Other Ambulatory Visit: Payer: Self-pay

## 2020-07-15 VITALS — BP 128/67 | HR 78 | Ht 71.0 in | Wt 151.0 lb

## 2020-07-15 VITALS — BP 138/62 | HR 77 | Ht 69.0 in | Wt 151.0 lb

## 2020-07-15 DIAGNOSIS — I272 Pulmonary hypertension, unspecified: Secondary | ICD-10-CM | POA: Diagnosis not present

## 2020-07-15 DIAGNOSIS — I2510 Atherosclerotic heart disease of native coronary artery without angina pectoris: Secondary | ICD-10-CM | POA: Diagnosis not present

## 2020-07-15 DIAGNOSIS — T17908A Unspecified foreign body in respiratory tract, part unspecified causing other injury, initial encounter: Secondary | ICD-10-CM

## 2020-07-15 DIAGNOSIS — R202 Paresthesia of skin: Secondary | ICD-10-CM

## 2020-07-15 DIAGNOSIS — R918 Other nonspecific abnormal finding of lung field: Secondary | ICD-10-CM | POA: Diagnosis not present

## 2020-07-15 DIAGNOSIS — M6281 Muscle weakness (generalized): Secondary | ICD-10-CM | POA: Diagnosis not present

## 2020-07-15 DIAGNOSIS — J9811 Atelectasis: Secondary | ICD-10-CM

## 2020-07-15 DIAGNOSIS — Z8616 Personal history of COVID-19: Secondary | ICD-10-CM | POA: Diagnosis not present

## 2020-07-15 DIAGNOSIS — G609 Hereditary and idiopathic neuropathy, unspecified: Secondary | ICD-10-CM

## 2020-07-15 DIAGNOSIS — E538 Deficiency of other specified B group vitamins: Secondary | ICD-10-CM

## 2020-07-15 DIAGNOSIS — R0602 Shortness of breath: Secondary | ICD-10-CM | POA: Diagnosis not present

## 2020-07-15 LAB — PULMONARY FUNCTION TEST
DL/VA: 3.04 ml/min/mmHg/L
DLCO cor: 18.8 ml/min/mmHg
DLCO unc: 17.58 ml/min/mmHg
FEF 25-75 Post: 2.41 L/sec
FEF 25-75 Pre: 1.75 L/sec
FEF2575-%Change-Post: 37 %
FEF2575-%Pred-Post: 169 %
FEF2575-%Pred-Pre: 122 %
FEV1-%Change-Post: 5 %
FEV1-%Pred-Post: 112 %
FEV1-%Pred-Pre: 106 %
FEV1-Post: 2.71 L
FEV1-Pre: 2.57 L
FEV1FVC-%Change-Post: 3 %
FEV1FVC-%Pred-Pre: 106 %
FEV6-%Change-Post: 2 %
FEV6-%Pred-Post: 107 %
FEV6-%Pred-Pre: 105 %
FEV6-Post: 3.5 L
FEV6-Pre: 3.43 L
FEV6FVC-%Change-Post: 0 %
FEV6FVC-%Pred-Post: 107 %
FEV6FVC-%Pred-Pre: 106 %
FVC-%Change-Post: 1 %
FVC-%Pred-Post: 100 %
FVC-%Pred-Pre: 99 %
FVC-Post: 3.56 L
FVC-Pre: 3.5 L
Post FEV1/FVC ratio: 76 %
Post FEV6/FVC ratio: 99 %
Pre FEV1/FVC ratio: 73 %
Pre FEV6/FVC Ratio: 98 %
RV % pred: 131 %
RV: 3.78 L
TLC % pred: 102 %
TLC: 7.25 L
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
## 2020-07-15 NOTE — Patient Instructions (Signed)
Thank you for visiting Dr. Myldred Raju at [facility]. Today we recommend the following:  Return if symptoms worsen or fail to improve.    Please do your part to reduce the spread of COVID-19.  

## 2020-07-15 NOTE — Progress Notes (Signed)
Synopsis: Referred in June 2022 for sob abnormal imaging by [name], MD  Subjective:   PATIENT ID: [name] GENDER: male DOB: 01-Apr-1926, MRN: [id number]  Chief Complaint  Patient presents with   Shortness of Breath    PFT follow up    This is a 87 year old gentleman, past medical history of atrial fibrillation, coronary artery disease status post stenting, prior CABG in 2013, CKD, prostate cancer status post TURP, retired from 23 years in the [employer] in [place], retired from [employer] in [place].  Complaints today of shortness of breath.  This seems to be episodic in nature.  Additionally he has trouble swallowing at times has had EGD in the past.  Occasionally he gets choked up at times.  Recently had COVID and has been slowly recovering from this.  Still feels short of breath with exertion.  He had a nuclear medicinePET scan completed 05/18/2020 with Dr. Alen Blew.  This revealed no significant recurrence of prostate cancer no evidence of metastasis however did show mild lower lobe consolidation concern for aspiration pneumonitis within the right lower lobe.  He has not had a recent swallow study.  Additionally prior echocardiogram completed last year on 07/07/2019 reveals moderate pulmonary hypertension, grade 2 diastolic dysfunction.  Patient follows with Dr. Burt Knack.  OV 07/15/2020: PFTs completed today. All within normal limits. He has been trying to get out and exercise more. He feels like this has been slow. Feels more run down than before.     Past Medical History:  Diagnosis Date   Acute superficial venous thrombosis of lower extremity    a. RLE after CABG, neg dopp for DVT.   Allergy    Anemia    Atrial fibrillation ([place])    a. Post-op from CABG, on amiodarone temporarily, d/c'd 12/2011   CAD (coronary artery disease)    a. S/P stenting to mid RCA, prox PDA 06/1999. b. NSTEMI/CABG x 3 in 10/2011 with LIMA to LAD, SVG to PDA, and SVG to OM1.    Chronic UTI    a.  Followed by Dr. Risa Grill - colonized/asymptomatic - not on abx   CKD (chronic kidney disease), stage III ([place])    a. stable with a creatinine around 1.9-2.0 followed by nephrology.   Dyslipidemia    Echocardiogram    Echocardiogram 08/2018: EF 50-55, inf-lat AK, mild LVH, Gr 1 DD, RVSP 47, mild LAE, mild to mod MR, mild AI, mild AS (mean 11)   GERD (gastroesophageal reflux disease)    Hypertension    well-controlled.   Myocardial infarction ([place])    Neck injury    a. C3-C4 and C4-C5 foraminal narrowing, severe   Prostate cancer ([place])    a. 2001 s/p TURP.   Pulmonary nodule    a. felt to be noncancerous.  Status post followup CT scan 4 mm and stable.   Renal artery stenosis ([facility])    a. 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     Family History  Problem Relation Age of Onset   Coronary artery disease Father        died @ 34   Other Mother        cerebral hemorrhage - died @ 1   Arthritis Mother    Cancer Brother        Bladder   Prostate cancer Brother    Nephritis Brother        died @  age 71.   Other Brother        cerebral hemorrhage - died @ 19   Heart disease Brother    Breast cancer Other        niece x 2   Diabetes Neg Hx    Colon cancer Neg Hx    Adrenal disorder Neg Hx      Past Surgical History:  Procedure Laterality Date   ANTERIOR CHAMBER WASHOUT Left 10/04/2018   Procedure: Anterior Chamber Washout, Vitreous Tap;  Surgeon: Jalene Mullet, MD;  Location: [facility];  Service: Ophthalmology;  Laterality: Left;   cardia catherization  07-07-99   CARDIAC SURGERY  10/18/12   open heart surgery   CATARACT EXTRACTION W/ INTRAOCULAR LENS  IMPLANT, BILATERAL  3/205, 06/2013   [facility]   COLONOSCOPY  04/12/07   CORONARY ARTERY BYPASS GRAFT  10/19/2011   Procedure: CORONARY ARTERY BYPASS GRAFTING (CABG);  Surgeon: Gaye Pollack, MD;  Location: [facility];  Service: Open Heart Surgery;  Laterality: N/A;  times three using  Left Internal Mammary Artery and Right Greater Saphenouse Vein Graft harvested Endoscopically   edg  07-17-1994   FLEXIBLE SIGMOIDOSCOPY  11-03-1997   GAS INSERTION Left 10/04/2018   Procedure: Insertion Of Gas;  Surgeon: Jalene Mullet, MD;  Location: [facility];  Service: Ophthalmology;  Laterality: Left;   GAS/FLUID EXCHANGE Left 10/04/2018   Procedure: Gas/Fluid Exchange;  Surgeon: Jalene Mullet, MD;  Location: [facility];  Service: Ophthalmology;  Laterality: Left;   LEFT HEART CATHETERIZATION WITH CORONARY ANGIOGRAM N/A 10/16/2011   Procedure: LEFT HEART CATHETERIZATION WITH CORONARY ANGIOGRAM;  Surgeon: Peter M Martinique, MD;  Location: [facility] CATH LAB;  Service: Cardiovascular;  Laterality: N/A;   LEFT HEART CATHETERIZATION WITH CORONARY/GRAFT ANGIOGRAM N/A 03/11/2013   Procedure: LEFT HEART CATHETERIZATION WITH [name];  Surgeon: Blane Ohara, MD;  [facility] C[facility];  Service: Cardiovascular;  Laterality: N/A;   lumbar spinal disk and neck fusion surgery     PACEMAKER INSERTION  03/28/12   PPM implanted for mobitz II AV block   PARS PLANA VITRECTOMY Left 10/04/2018   Procedure: PARS PLANA VITRECTOMY 25 GAUGE FOR ENDOPHTHALMITIS WITH INJECTION OF INTRAVITREAL ANTIBIOTIC;  Surgeon: Jalene Mullet, MD;  Location: [facility];  Service: Ophthalmology;  Laterality: Left;   peripheral vascular catherization  11-24-03   PERMANENT PACEMAKER INSERTION N/A 03/28/2012   Procedure: PERMANENT PACEMAKER INSERTION;  Surgeon: Thompson Grayer, MD;  Location: [facility] CATH LAB;  Service: Cardiovascular;  Laterality: N/A;   PROSTATECTOMY     renal circulation  10-01-03   s/p ptca     stents     X 2   stress cardiolite  05-04-05   spring 09-negative except for apical thinning, EF 68%    Social History   Socioeconomic History   Marital status: Married    Spouse name: [name]   Number of children: 2   Years of education: Not on file   Highest education level: Some college, no degree  Occupational History    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  married '52 - 3 years, divorced; married '56 - 3 years divorced; married '51-12 yrs - divorced; married '75 -. 1 son '57; 1 daughter - '53; 1 grandchild.  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  Very happily married.  End of life care: yes CPR, no long term mechanical ventilation, no heroic measures.right handed   Right handed   Social Determinants of Health   Financial Resource Strain: Not on file  Food Insecurity: Not on file  Transportation Needs: Not on file  Physical Activity: Not on file  Stress: Not on file  Social Connections: Not on file  Intimate Partner Violence: Not on file     Allergies  Allergen Reactions   Aspirin Nausea And Vomiting and Other (See Comments)    Upset stomach- tolerates coated aspirin    Lisinopril Anaphylaxis and Shortness Of Breath    After 3 tablets, he experienced trouble swallowing, throat tightness and hoarseness.    Amoxicillin Nausea Only   Atarax [Hydroxyzine Hcl] Nausea And Vomiting   Cephalexin Diarrhea   Ciprofloxacin Nausea Only   Clindamycin Other (See Comments)    Reaction not recalled   Clobetasol Other (See Comments)    Not effective   Codeine Nausea Only and Other (See Comments)    Stomach upset   Fish Allergy Nausea And Vomiting   Fluarix [Influenza Virus Vaccine] Itching   Haemophilus Influenzae Itching   Hydrocodone Nausea Only   Hydrocodone-Acetaminophen Nausea And Vomiting   Hydroxyzine Nausea  And Vomiting   Latex Itching and Other (See Comments)    (After flu shot)   Macrobid [Nitrofurantoin Macrocrystal] Nausea Only   Niacin Other (See Comments)    Unknown reaction   Niacin-Lovastatin Er Other (See Comments)    Unsure of reaction. Taking simvastatin at home without problems   Niacin-Lovastatin Er Other (See Comments)    Reaction not recalled   Nitrofurantoin Other (See Comments)    Upset stomach    Omeprazole Other (See Comments)    Reaction not recalled- stopped by MD   Other Nausea And Vomiting   Vibramycin [Doxycycline] Other (See Comments)    Reaction not recalled   Adhesive [Tape] Rash   Bactrim [Sulfamethoxazole-Trimethoprim] Rash   Colchicine Rash   Gabapentin     Painful pimples on tongue   Nortriptyline Rash     Outpatient Medications Prior to Visit  Medication Sig Dispense Refill   acetaminophen (TYLENOL) 500 MG tablet Take 500 mg by mouth every 6 (six) hours as needed.     allopurinol (ZYLOPRIM) 100 MG tablet TAKE 1 TABLET(100 MG) BY MOUTH DAILY 90 tablet 1   amLODipine (NORVASC) 5 MG tablet Take 1 tablet (5 mg total) by mouth daily. 90 tablet 2   Aromatic Inhalants (VICKS VAPOR INHALER IN) Place 1 puff into both nostrils as needed (for congestion).     aspirin EC 81 MG tablet Take 81 mg by mouth daily.     Calcium Citrate-Vitamin D (CITRACAL + D PO) Take 2 tablets by mouth in the morning and at bedtime.     Carboxymeth-Glycerin-Polysorb (REFRESH OPTIVE MEGA-3 OP) Place 1 drop into both eyes 2 (two) times daily.     epoetin alfa-epbx (RETACRIT) 2000 UNIT/ML injection 2,000 Units every 14 (fourteen) days. Patient states he only gets it when he needs it but not every 2 weeks  folic acid (FOLVITE) 1 MG tablet Take 1 tablet (1 mg total) by mouth daily. Annual appt due in May must see provider for future refills 90 tablet 0   loratadine (CLARITIN) 10 MG tablet Take 10 mg by mouth daily as needed (for seasonal allergies).     nitroGLYCERIN (NITROSTAT) 0.4 MG  SL tablet DISSOLVE 1 TABLET UNDER THE TONGUE EVERY 5 MINUTES AS NEEDED FOR CHEST PAIN, MAXIMUM 3 TABLETS 100 tablet 1   nortriptyline (PAMELOR) 10 MG capsule Take 1 capsule (10 mg total) by mouth at bedtime. 30 capsule 5   vitamin C (ASCORBIC ACID) 500 MG tablet Take 1,000 mg by mouth daily.      Vitamin D, Ergocalciferol, (DRISDOL) 1.25 MG (50000 UT) CAPS capsule Take 50,000 Units by mouth every 30 (thirty) days.      famotidine (PEPCID) 20 MG tablet Take 1 tablet (20 mg total) by mouth at bedtime. 30 tablet 0   furosemide (LASIX) 40 MG tablet Take 1 tablet (40 mg total) by mouth daily. 30 tablet 0   Facility-Administered Medications Prior to Visit  Medication Dose Route Frequency Provider Last Rate Last Admin   NON FORMULARY 1 application  1 application Topical PRN Landis Martins, DPM        Review of Systems  Constitutional:  Negative for chills, fever, malaise/fatigue and weight loss.  HENT:  Negative for hearing loss, sore throat and tinnitus.   Eyes:  Negative for blurred vision and double vision.  Respiratory:  Positive for shortness of breath. Negative for cough, hemoptysis, sputum production, wheezing and stridor.   Cardiovascular:  Negative for chest pain, palpitations, orthopnea, leg swelling and PND.  Gastrointestinal:  Negative for abdominal pain, constipation, diarrhea, heartburn, nausea and vomiting.  Genitourinary:  Negative for dysuria, hematuria and urgency.  Musculoskeletal:  Negative for joint pain and myalgias.  Skin:  Negative for itching and rash.  Neurological:  Negative for dizziness, tingling, weakness and headaches.  Endo/Heme/Allergies:  Negative for environmental allergies. Does not bruise/bleed easily.  Psychiatric/Behavioral:  Negative for depression. The patient is not nervous/anxious and does not have insomnia.   All other systems reviewed and are negative.   Objective:  Physical Exam Vitals reviewed.  Constitutional:      General: He is not in acute  distress.    Appearance: He is well-developed.  HENT:     Head: Normocephalic and atraumatic.  Eyes:     General: No scleral icterus.    Conjunctiva/sclera: Conjunctivae normal.     Pupils: Pupils are equal, round, and reactive to light.  Neck:     Vascular: No JVD.     Trachea: No tracheal deviation.  Cardiovascular:     Rate and Rhythm: Normal rate and regular rhythm.     Heart sounds: Normal heart sounds. No murmur heard. Pulmonary:     Effort: Pulmonary effort is normal. No tachypnea, accessory muscle usage or respiratory distress.     Breath sounds: Normal breath sounds. No stridor. No wheezing, rhonchi or rales.  Abdominal:     General: Bowel sounds are normal. There is no distension.     Palpations: Abdomen is soft.     Tenderness: There is no abdominal tenderness.  Musculoskeletal:        General: No tenderness.     Cervical back: Neck supple.  Lymphadenopathy:     Cervical: No cervical adenopathy.  Skin:    General: Skin is warm and dry.     Capillary Refill: Capillary refill takes less than  2 seconds.     Findings: No rash.  Neurological:     Mental Status: He is alert and oriented to person, place, and time.  Psychiatric:        Behavior: Behavior normal.     Vitals:   07/15/20 1614  BP: 138/62  Pulse: 77  SpO2: 97%  Weight: 151 lb (68.5 kg)  Height: 5\' 9"  (1.753 m)   97% on RA BMI Readings from Last 3 Encounters:  07/15/20 22.30 kg/m  07/15/20 21.06 kg/m  06/22/20 21.55 kg/m   Wt Readings from Last 3 Encounters:  07/15/20 151 lb (68.5 kg)  07/15/20 151 lb (68.5 kg)  06/22/20 150 lb 3.2 oz (68.1 kg)     CBC    Component Value Date/Time   WBC 7.5 05/23/2020 1154   RBC 4.84 05/23/2020 1154   HGB 12.5 (L) 07/13/2020 1242   HGB 11.3 (L) 05/19/2020 1433   HCT 39.5 05/23/2020 1154   PLT 350 05/23/2020 1154   PLT 217 05/19/2020 1433   MCV 81.6 05/23/2020 1154   MCH 25.6 (L) 05/23/2020 1154   MCHC 31.4 05/23/2020 1154   RDW 18.4 (H)  05/23/2020 1154   LYMPHSABS 1.2 05/23/2020 1154   MONOABS 0.9 05/23/2020 1154   EOSABS 0.1 05/23/2020 1154   BASOSABS 0.1 05/23/2020 1154    Chest Imaging: 05/18/2020 nuclear medicine pet image: CT image windows of the lungs reviewed. Lower lobe areas of infiltrate and groundglass likely consistent with basilar atelectasis, dependent atelectasis possible infiltrate. The patient's images have been independently reviewed by me.    Pulmonary Functions Testing Results: PFT Results Latest Ref Rng & Units 07/15/2020  FVC-Pre L 3.50  FVC-Predicted Pre % 99  FVC-Post L 3.56  FVC-Predicted Post % 100  Pre FEV1/FVC % % 73  Post FEV1/FCV % % 76  FEV1-Pre L 2.57  FEV1-Predicted Pre % 106  FEV1-Post L 2.71  DLCO uncorrected ml/min/mmHg 17.58  DLCO corrected ml/min/mmHg 18.80  TLC L 7.25  TLC % Predicted % 102  RV % Predicted % 131    FeNO:   Pathology:   Echocardiogram:   Heart Catheterization:     Assessment & Plan:     ICD-10-CM   1. SOB (shortness of breath)  R06.02     2. Abnormal CT scan of lung  R91.8     3. Aspiration into airway, initial encounter  T17.908A     4. Pulmonary hypertension ([facility]CC)  I27.20     5. History of COVID-19  Z86.16     6. Atelectasis  J98.11       Discussion:  87 yo, multifactorial etiologies of SOB, I do not believe that he is limited by his lung function. PFTS reviewed today. Recovering from covid19. Imaging looks reassuring.   Plan: Recommend him starting by his exercise routine He will let us know if he is getting worse  Follow up cardiology as routinely scheduled Follow up with [place] in 1 year or as needed     Current Outpatient Medications:    acetaminophen (TYLENOL) 500 MG tablet, Take 500 mg by mouth every 6 (six) hours as needed., Disp: , Rfl:    allopurinol (ZYLOPRIM) 100 MG tablet, TAKE 1 TABLET(100 MG) BY MOUTH DAILY, Disp: 90 tablet, Rfl: 1   amLODipine (NORVASC) 5 MG tablet, Take 1 tablet (5 mg total) by mouth daily., Disp:  90 tablet, Rfl: 2   Aromatic Inhalants (VICKS VAPOR INHALER IN), Place 1 puff into both nostrils as needed (for congestion).,  Disp: , Rfl:    aspirin EC 81 MG tablet, Take 81 mg by mouth daily., Disp: , Rfl:    Calcium Citrate-Vitamin D (CITRACAL + D PO), Take 2 tablets by mouth in the morning and at bedtime., Disp: , Rfl:    Carboxymeth-Glycerin-Polysorb (REFRESH OPTIVE MEGA-3 OP), Place 1 drop into both eyes 2 (two) times daily., Disp: , Rfl:    epoetin alfa-epbx (RETACRIT) 2000 UNIT/ML injection, 2,000 Units every 14 (fourteen) days. Patient states he only gets it when he needs it but not every 2 weeks, Disp: , Rfl:    folic acid (FOLVITE) 1 MG tablet, Take 1 tablet (1 mg total) by mouth daily. Annual appt due in May must see provider for future refills, Disp: 90 tablet, Rfl: 0   loratadine (CLARITIN) 10 MG tablet, Take 10 mg by mouth daily as needed (for seasonal allergies)., Disp: , Rfl:    nitroGLYCERIN (NITROSTAT) 0.4 MG SL tablet, DISSOLVE 1 TABLET UNDER THE TONGUE EVERY 5 MINUTES AS NEEDED FOR CHEST PAIN, MAXIMUM 3 TABLETS, Disp: 100 tablet, Rfl: 1   nortriptyline (PAMELOR) 10 MG capsule, Take 1 capsule (10 mg total) by mouth at bedtime., Disp: 30 capsule, Rfl: 5   vitamin C (ASCORBIC ACID) 500 MG tablet, Take 1,000 mg by mouth daily. , Disp: , Rfl:    Vitamin D, Ergocalciferol, (DRISDOL) 1.25 MG (50000 UT) CAPS capsule, Take 50,000 Units by mouth every 30 (thirty) days. , Disp: , Rfl:    famotidine (PEPCID) 20 MG tablet, Take 1 tablet (20 mg total) by mouth at bedtime., Disp: 30 tablet, Rfl: 0   furosemide (LASIX) 40 MG tablet, Take 1 tablet (40 mg total) by mouth daily., Disp: 30 tablet, Rfl: 0  Current Facility-Administered Medications:    NON FORMULARY 1 application, 1 application, Topical, PRN, Landis Martins, DPM    Garner Nash, DO [facility] 07/15/2020 4:21 PM

## 2020-07-19 ENCOUNTER — Other Ambulatory Visit: Payer: Self-pay

## 2020-07-19 ENCOUNTER — Ambulatory Visit (INDEPENDENT_AMBULATORY_CARE_PROVIDER_SITE_OTHER): Payer: Medicare Other

## 2020-07-19 VITALS — BP 126/70 | HR 85 | Temp 98.4°F | Ht 70.0 in | Wt 152.6 lb

## 2020-07-19 DIAGNOSIS — Z0000 Encounter for general adult medical examination without abnormal findings: Secondary | ICD-10-CM

## 2020-07-19 NOTE — Progress Notes (Signed)
Subjective:   [name] is a 87 y.o. male who presents for Medicare Annual/Subsequent preventive examination.  Review of Systems     Cardiac Risk Factors include: advanced age (>40mn, >>34women);dyslipidemia;family history of premature cardiovascular disease;hypertension;male gender     Objective:    Today's Vitals   07/19/20 1112  BP: 126/70  Pulse: 85  Temp: 98.4 F (36.9 C)  SpO2: 95%  Weight: 152 lb 9.6 oz (69.2 kg)  Height: _0  (1.778 m)  PainSc: 0-No pain   Body mass index is 21.9 kg/m.  Advanced Directives 07/19/2020 05/23/2020 12/22/2019 03/05/2019 12/17/2018 10/04/2018 10/04/2018  Does Patient Have a Medical Advance Directive? Yes No Yes Yes Yes No No  Type of [place]amed[place]of [place] will - - [place] will Living will;Healthcare Power of Attorney - -  Does patient want to make changes to medical advance directive? No - Patient declined - - - - - -  Copy of [place] Chart? No - copy requested - - - - - -  Would patient like information on creating a medical advance directive? - - - - - No - Patient declined No - Patient declined  Pre-existing out of facility DNR order (yellow form or pink MOST form) - - - - - - -    Current Medications (verified) Outpatient Encounter Medications as of 07/19/2020  Medication Sig   acetaminophen (TYLENOL) 500 MG tablet Take 500 mg by mouth every 6 (six) hours as needed.   allopurinol (ZYLOPRIM) 100 MG tablet TAKE 1 TABLET(100 MG) BY MOUTH DAILY   amLODipine (NORVASC) 5 MG tablet Take 1 tablet (5 mg total) by mouth daily.   Aromatic Inhalants (VICKS VAPOR INHALER IN) Place 1 puff into both nostrils as needed (for congestion).   aspirin EC 81 MG tablet Take 81 mg by mouth daily.   Calcium Citrate-Vitamin D (CITRACAL + D PO) Take 2 tablets by mouth in the morning and at bedtime.   Carboxymeth-Glycerin-Polysorb (REFRESH OPTIVE MEGA-3 OP) Place 1 drop  into both eyes 2 (two) times daily.   epoetin alfa-epbx (RETACRIT) 2000 UNIT/ML injection 2,000 Units every 14 (fourteen) days. Patient states he only gets it when he needs it but not every 2 weeks   famotidine (PEPCID) 20 MG tablet Take 1 tablet (20 mg total) by mouth at bedtime.   folic acid (FOLVITE) 1 MG tablet Take 1 tablet (1 mg total) by mouth daily. Annual appt due in May must see provider for future refills   furosemide (LASIX) 40 MG tablet Take 1 tablet (40 mg total) by mouth daily.   loratadine (CLARITIN) 10 MG tablet Take 10 mg by mouth daily as needed (for seasonal allergies).   nitroGLYCERIN (NITROSTAT) 0.4 MG SL tablet DISSOLVE 1 TABLET UNDER THE TONGUE EVERY 5 MINUTES AS NEEDED FOR CHEST PAIN, MAXIMUM 3 TABLETS   nortriptyline (PAMELOR) 10 MG capsule Take 1 capsule (10 mg total) by mouth at bedtime.   vitamin C (ASCORBIC ACID) 500 MG tablet Take 1,000 mg by mouth daily.    Vitamin D, Ergocalciferol, (DRISDOL) 1.25 MG (50000 UT) CAPS capsule Take 50,000 Units by mouth every 30 (thirty) days.    Facility-Administered Encounter Medications as of 07/19/2020  Medication   NON FORMULARY 1 application    Allergies (verified) Aspirin, Lisinopril, Amoxicillin, Atarax [hydroxyzine hcl], Cephalexin, Ciprofloxacin, Clindamycin, Clobetasol, Codeine, Fish allergy, Fluarix [influenza virus vaccine], Haemophilus influenzae, Hydrocodone, Hydrocodone-acetaminophen, Hydroxyzine, Latex, Macrobid [nitrofurantoin macrocrystal], Niacin, Niacin-lovastatin er,  Niacin-lovastatin er, Nitrofurantoin, Omeprazole, Other, Vibramycin [doxycycline], Adhesive [tape], Bactrim [sulfamethoxazole-trimethoprim], Colchicine, Gabapentin, and Nortriptyline   History: Past Medical History:  Diagnosis Date   Acute superficial venous thrombosis of lower extremity    a. RLE after CABG, neg dopp for DVT.   Allergy    Anemia    Atrial fibrillation ([place])    a. Post-op from CABG, on amiodarone temporarily, d/c'd 12/2011    CAD (coronary artery disease)    a. S/P stenting to mid RCA, prox PDA 06/1999. b. NSTEMI/CABG x 3 in 10/2011 with LIMA to LAD, SVG to PDA, and SVG to OM1.    Chronic UTI    a. Followed by Dr. Risa Grill - colonized/asymptomatic - not on abx   CKD (chronic kidney disease), stage III ([place])    a. stable with a creatinine around 1.9-2.0 followed by nephrology.   Dyslipidemia    Echocardiogram    Echocardiogram 08/2018: EF 50-55, inf-lat AK, mild LVH, Gr 1 DD, RVSP 47, mild LAE, mild to mod MR, mild AI, mild AS (mean 11)   GERD (gastroesophageal reflux disease)    Hypertension    well-controlled.   Myocardial infarction ([place])    Neck injury    a. C3-C4 and C4-C5 foraminal narrowing, severe   Prostate cancer ([place])    a. 2001 s/p TURP.   Pulmonary nodule    a. felt to be noncancerous.  Status post followup CT scan 4 mm and stable.   Renal artery stenosis ([facility])    a. 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   Past Surgical History:  Procedure Laterality Date   ANTERIOR CHAMBER WASHOUT Left 10/04/2018   Procedure: Anterior Chamber Washout, Vitreous Tap;  Surgeon: Jalene Mullet, MD;  Location: [facility];  Service: Ophthalmology;  Laterality: Left;   cardia catherization  07-07-99   CARDIAC SURGERY  10/18/12   open heart surgery   CATARACT EXTRACTION W/ INTRAOCULAR LENS  IMPLANT, BILATERAL  3/205, 06/2013   [facility]   COLONOSCOPY  04/12/07   CORONARY ARTERY BYPASS GRAFT  10/19/2011   Procedure: CORONARY ARTERY BYPASS GRAFTING (CABG);  Surgeon: Gaye Pollack, MD;  Location: [facility];  Service: Open Heart Surgery;  Laterality: N/A;  times three using Left Internal Mammary Artery and Right Greater Saphenouse Vein Graft harvested Endoscopically   edg  07-17-1994   FLEXIBLE SIGMOIDOSCOPY  11-03-1997   GAS INSERTION Left 10/04/2018   Procedure: Insertion Of Gas;  Surgeon: Jalene Mullet, MD;  Location: [facility];  Service: Ophthalmology;  Laterality: Left;    GAS/FLUID EXCHANGE Left 10/04/2018   Procedure: Gas/Fluid Exchange;  Surgeon: Jalene Mullet, MD;  Location: [facility];  Service: Ophthalmology;  Laterality: Left;   LEFT HEART CATHETERIZATION WITH CORONARY ANGIOGRAM N/A 10/16/2011   Procedure: LEFT HEART CATHETERIZATION WITH CORONARY ANGIOGRAM;  Surgeon: Peter M Martinique, MD;  Location[facility] CATH LAB;  Service: Cardiovascular;  Laterality: N/A;   LEFT HEART CATHETERIZATION WITH CORONARY/GRAFT ANGIOGRAM N/A 03/11/2013   Procedure: LEFT HEART CATHETERIZATION WITH [name];  Surgeon: Blane Ohara, MD;  Location: [facility] CATH LAB;  Service: Cardiovascular;  Laterality: N/A;   lumbar spinal disk and neck fusion surgery     PACEMAKER INSERTION  03/28/12   PPM implanted for mobitz II AV block   PARS PLANA VITRECTOMY Left 10/04/2018   Procedure: PARS PLANA VITRECTOMY 25 GAUGE FOR ENDOPHTHALMITIS WITH INJECTION OF INTRAVITREAL ANTIBIOTIC;  Surgeon: Jalene Mullet, MD;  Location: [facility];  Service: Ophthalmology;  Laterality: Left;   peripheral vascular catherization  11-24-03   PERMANENT PACEMAKER INSERTION N/A 03/28/2012   Procedure: PERMANENT PACEMAKER INSERTION;  Surgeon: Thompson Grayer, MD;  Location: [facility] CATH LAB;  Service: Cardiovascular;  Laterality: N/A;   PROSTATECTOMY     renal circulation  10-01-03   s/p ptca     stents     X 2   stress cardiolite  05-04-05   spring 09-negative except for apical thinning, EF 68%   Family History  Problem Relation Age of Onset   Coronary artery disease Father        died @ 46   Other Mother        cerebral hemorrhage - died @ 62   Arthritis Mother    Cancer Brother        Bladder   Prostate cancer Brother    Nephritis Brother        died @ age 42.   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  married '52 - 3 years, divorced; married '56 - 3 years divorced; married '83-12 yrs - divorced; married '75 -. 1 son '57; 1 daughter - '53; 1 grandchild.  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  Very happily married.  End of life care: yes CPR, no long term mechanical ventilation, no heroic measures.right handed   Right handed   Social Determinants of Health   Financial Resource Strain: Not on file  Food Insecurity: Not on file  Transportation Needs: Not on file  Physical Activity: Not on file  Stress: Not on file  Social Connections: Not on file    Tobacco Counseling Counseling given: Not Answered   Clinical Intake:  Pre-visit preparation completed: Yes  Pain : No/denies pain Pain Score: 0-No pain     BMI - recorded: 21.9 Nutritional Status: BMI of 19-24  Normal Nutritional Risks: Other (Comment) Diabetes: No  How often do you need to have someone help you when you read instructions, pamphlets, or other written materials from your doctor or pharmacy?: 1 - Never What is the last grade level you completed in school?: [place]; Retired from [employer] after 20+ years  Diabetic? no  Interpreter Needed?: No  Information entered by :: Lisette Abu, LPN   Activities  of Daily Living  In your present state of health, do you have any difficulty performing the following activities: 07/19/2020 10/21/2019  Hearing? N N  Vision? N N  Difficulty concentrating or making decisions? N N  Walking or climbing stairs? N N  Dressing or bathing? N N  Doing errands, shopping? N N  Preparing Food and eating ? N -  Using the Toilet? N -  In the past six months, have you accidently leaked urine? N -  Do you have problems with loss of bowel control? N -  Managing your Medications? N -  Managing your Finances? N -  Housekeeping or managing your Housekeeping? N -  Some recent data might be hidden    Patient Care Team: [name], MD as PCP - General (Internal Medicine) [name], MD as PCP - Cardiology (Cardiology) Rana Snare, MD (Inactive) (Urology) Edrick Oh, MD (Nephrology) Allyn Kenner, MD (Dermatology) Luberta Mutter, MD (Ophthalmology) Latanya Maudlin, MD (Orthopedic Surgery) Inda Castle, MD (Inactive) (Gastroenterology) Pieter Partridge, DO as Consulting Physician (Neurology) Suella Broad, MD as Consulting Physician (Physical Medicine and Rehabilitation)  Indicate any recent Medical Services you may have received from other than [facility] providers in the past year (date may be approximate).     Assessment:   This is a routine wellness examination for [place].  Hearing/Vision screen Hearing Screening - Comments:: Patient denied any hearing difficulty. Vision Screening - Comments:: Patient wears glasses.  Patient has macular degeneration in left eye.  Eye exams done once a year by Luberta Mutter, MD.  Dietary issues and exercise activities discussed: Current Exercise Habits: Structured exercise class, Type of exercise: walking;strength training/weights, Time (Minutes): 30, Frequency (Times/Week): 5, Weekly Exercise (Minutes/Week): 150, Intensity: Mild, Exercise limited by: cardiac condition(s);orthopedic condition(s)   Goals Addressed    None   Depression Screen PHQ 2/9 Scores 07/19/2020 04/09/2020 03/24/2019 10/22/2016 10/22/2016 08/02/2016 09/01/2015  PHQ - 2 Score 0 3 0 1 2 0 0  PHQ- 9 Score - 9 - 3 - - -    Fall Risk Fall Risk  07/19/2020 07/15/2020 12/22/2019 03/05/2019 12/17/2018  Falls in the past year? 0 0 0 0 0  Number falls in past yr: 0 - 0 0 0  Injury with Fall? 0 - 0 0 0  Risk for fall due to : No Fall Risks - - Impaired balance/gait -  Follow up Falls evaluation completed - - - -    FALL RISK PREVENTION PERTAINING TO THE HOME:  Any stairs in or around the home? Yes  If so, are there any without handrails? No  Home free of loose throw rugs in walkways, pet beds, electrical cords, etc? Yes  Adequate lighting in your home to reduce risk of falls? Yes   ASSISTIVE DEVICES UTILIZED TO PREVENT FALLS:  Life alert? No  Use of a cane, walker or w/c? Yes  Grab bars in the bathroom? No  Shower chair or bench in shower? Yes  Elevated toilet seat or a handicapped toilet? Yes   TIMED UP AND GO:  Was the test performed? Yes .  Length of time to ambulate 10 feet: 12 sec.   Gait slow and steady with assistive device  Cognitive Function: Normal cognitive status assessed by direct observation by this Nurse Health Advisor. No abnormalities found.   MMSE - Mini Mental State Exam 08/02/2016  Orientation to time 5  Orientation to Place 5  Registration 3  Attention/ Calculation 5  Recall 2  Language- name 2 objects 2  Language- repeat 1  Language- follow 3 step command 3  Language- read & follow direction 1  Write a sentence 1  Copy design 1  Total score 29        Immunizations Immunization History  Administered Date(s) Administered   Fluad Quad(high Dose 65+) 09/17/2018, 09/24/2019   H1N1 04/20/2008   Influenza Split 09/13/2011   Influenza Whole 10/12/2007, 11/18/2008, 09/15/2009, 09/08/2010   Influenza, High Dose Seasonal PF 09/08/2015, 10/10/2016, 09/11/2017   Influenza,inj,Quad PF,6+ Mos 09/16/2012,  08/26/2013, 08/31/2014   Influenza,inj,quad, With Preservative 09/25/2018   Influenza-Unspecified 09/12/2016   MMR 04/21/2010   PFIZER(Purple Top)SARS-COV-2 Vaccination 01/30/2019, 02/19/2019, 10/16/2019   Pneumococcal Conjugate-13 02/04/2013   Pneumococcal Polysaccharide-23 02/21/2006, 08/31/2014   Pneumococcal-Unspecified 08/23/2014   Td 02/21/2006   Tdap 04/11/2016   Zoster Recombinat (Shingrix) 08/22/2016, 12/05/2016   Zoster, Live 08/21/2005    TDAP status: Up to date  Flu Vaccine status: Up to date  Pneumococcal vaccine status: Up to date  Covid-19 vaccine status: Completed vaccines  Qualifies for Shingles Vaccine? Yes   Zostavax completed Yes   Shingrix Completed?: Yes  Screening Tests Health Maintenance  Topic Date Due   COVID-19 Vaccine (4 - Booster for Pfizer series) 01/16/2020   INFLUENZA VACCINE  08/08/2020   TETANUS/TDAP  04/12/2026   PNA vac Low Risk Adult  Completed   Zoster Vaccines- Shingrix  Completed   HPV VACCINES  Aged Out    Health Maintenance  Health Maintenance Due  Topic Date Due   COVID-19 Vaccine (4 - Booster for Pfizer series) 01/16/2020    Colorectal cancer screening: No longer required.   Lung Cancer Screening: (Low Dose CT Chest recommended if Age 31-80 years, 30 pack-year currently smoking OR have quit w/in 15years.) does not qualify.   Lung Cancer Screening Referral: no  Additional Screening:  Hepatitis C Screening: does not qualify; Completed no  Vision Screening: Recommended annual ophthalmology exams for early detection of glaucoma and other disorders of the eye. Is the patient up to date with their annual eye exam?  Yes  Who is the provider or what is the name of the office in which the patient attends annual eye exams? Luberta Mutter, MD. If pt is not established with a provider, would they like to be referred to a provider to establish care? No .   Dental Screening: Recommended annual dental exams for proper oral  hygiene  Community Resource Referral / Chronic Care Management: CRR required this visit?  No   CCM required this visit?  No      Plan:     I have personally reviewed and noted the following in the patient's chart:   Medical and social history Use of alcohol, tobacco or illicit drugs  Current medications and supplements including opioid prescriptions. Patient is not currently taking opioid prescriptions. Functional ability and status Nutritional status Physical activity Advanced directives List of other physicians Hospitalizations, surgeries, and ER visits in previous 12 months Vitals Screenings to include cognitive, depression, and falls Referrals and appointments  In addition, I have reviewed and discussed with patient certain preventive protocols, quality metrics, and best practice recommendations. A written personalized care plan for preventive services as well as general preventive health recommendations were provided to patient.     Sheral Flow, LPN   [id number]   Nurse Notes: n/a

## 2020-07-19 NOTE — Patient Instructions (Signed)
[name] , Thank you for taking time to come for your Medicare Wellness Visit. I appreciate your ongoing commitment to your health goals. Please review the following plan we discussed and let me know if I can assist you in the future.   Screening recommendations/referrals: Colonoscopy: not a candidate for colon cancer screening due to age Recommended yearly ophthalmology/optometry visit for glaucoma screening and checkup Recommended yearly dental visit for hygiene and checkup  Vaccinations: Influenza vaccine: 09/24/2019 Pneumococcal vaccine: 08/23/2014, 08/31/2014 Tdap vaccine: 04/11/2016; due every 10 years Shingles vaccine: 08/22/2016, 12/05/2016   Covid-19: 01/30/2019, 02/19/2019, 10/16/2019  Advanced directives: Please bring a copy of your health care power of attorney and living will to the office at your convenience.  Conditions/risks identified: Client understands the importance of follow-up with providers by attending scheduled visits and discussed goals to eat healthier, increase physical activity, exercise the brain, socialize more, get enough rest and make time for laughter.  Next appointment: Please schedule your next Medicare Wellness Visit with your Nurse Health Advisor in 1 year by calling [phone].  Preventive Care 85 Years and Older, Male Preventive care refers to lifestyle choices and visits with your health care provider that can promote health and wellness. What does preventive care include? A yearly physical exam. This is also called an annual well check. Dental exams once or twice a year. Routine eye exams. Ask your health care provider how often you should have your eyes checked. Personal lifestyle choices, including: Daily care of your teeth and gums. Regular physical activity. Eating a healthy diet. Avoiding tobacco and drug use. Limiting alcohol use. Practicing safe sex. Taking low doses of aspirin every day. Taking vitamin and mineral supplements as  recommended by your health care provider. What happens during an annual well check? The services and screenings done by your health care provider during your annual well check will depend on your age, overall health, lifestyle risk factors, and family history of disease. Counseling  Your health care provider may ask you questions about your: Alcohol use. Tobacco use. Drug use. Emotional well-being. Home and relationship well-being. Sexual activity. Eating habits. History of falls. Memory and ability to understand (cognition). Work and work Statistician. Screening  You may have the following tests or measurements: Height, weight, and BMI. Blood pressure. Lipid and cholesterol levels. These may be checked every 5 years, or more frequently if you are over 59 years old. Skin check. Lung cancer screening. You may have this screening every year starting at age 27 if you have a 30-pack-year history of smoking and currently smoke or have quit within the past 15 years. Fecal occult blood test (FOBT) of the stool. You may have this test every year starting at age 50. Flexible sigmoidoscopy or colonoscopy. You may have a sigmoidoscopy every 5 years or a colonoscopy every 10 years starting at age 75. Prostate cancer screening. Recommendations will vary depending on your family history and other risks. Hepatitis C blood test. Hepatitis B blood test. Sexually transmitted disease (STD) testing. Diabetes screening. This is done by checking your blood sugar (glucose) after you have not eaten for a while (fasting). You may have this done every 1-3 years. Abdominal aortic aneurysm (AAA) screening. You may need this if you are a current or former smoker. Osteoporosis. You may be screened starting at age 26 if you are at high risk. Talk with your health care provider about your test results, treatment options, and if necessary, the need for more tests. Vaccines  Your health  care provider may recommend  certain vaccines, such as: Influenza vaccine. This is recommended every year. Tetanus, diphtheria, and acellular pertussis (Tdap, Td) vaccine. You may need a Td booster every 10 years. Zoster vaccine. You may need this after age 52. Pneumococcal 13-valent conjugate (PCV13) vaccine. One dose is recommended after age 39. Pneumococcal polysaccharide (PPSV23) vaccine. One dose is recommended after age 59. Talk to your health care provider about which screenings and vaccines you need and how often you need them. This information is not intended to replace advice given to you by your health care provider. Make sure you discuss any questions you have with your health care provider. Document Released: 01/21/2015 Document Revised: 09/14/2015 Document Reviewed: 10/26/2014 Elsevier Interactive Patient Education  2017 [place] Prevention in the Home Falls can cause injuries. They can happen to people of all ages. There are many things you can do to make your home safe and to help prevent falls. What can I do on the outside of my home? Regularly fix the edges of walkways and driveways and fix any cracks. Remove anything that might make you trip as you walk through a door, such as a raised step or threshold. Trim any bushes or trees on the path to your home. Use bright outdoor lighting. Clear any walking paths of anything that might make someone trip, such as rocks or tools. Regularly check to see if handrails are loose or broken. Make sure that both sides of any steps have handrails. Any raised decks and porches should have guardrails on the edges. Have any leaves, snow, or ice cleared regularly. Use sand or salt on walking paths during winter. Clean up any spills in your garage right away. This includes oil or grease spills. What can I do in the bathroom? Use night lights. Install grab bars by the toilet and in the tub and shower. Do not use towel bars as grab bars. Use non-skid mats or  decals in the tub or shower. If you need to sit down in the shower, use a plastic, non-slip stool. Keep the floor dry. Clean up any water that spills on the floor as soon as it happens. Remove soap buildup in the tub or shower regularly. Attach bath mats securely with double-sided non-slip rug tape. Do not have throw rugs and other things on the floor that can make you trip. What can I do in the bedroom? Use night lights. Make sure that you have a light by your bed that is easy to reach. Do not use any sheets or blankets that are too big for your bed. They should not hang down onto the floor. Have a firm chair that has side arms. You can use this for support while you get dressed. Do not have throw rugs and other things on the floor that can make you trip. What can I do in the kitchen? Clean up any spills right away. Avoid walking on wet floors. Keep items that you use a lot in easy-to-reach places. If you need to reach something above you, use a strong step stool that has a grab bar. Keep electrical cords out of the way. Do not use floor polish or wax that makes floors slippery. If you must use wax, use non-skid floor wax. Do not have throw rugs and other things on the floor that can make you trip. What can I do with my stairs? Do not leave any items on the stairs. Make sure that there are handrails on  both sides of the stairs and use them. Fix handrails that are broken or loose. Make sure that handrails are as long as the stairways. Check any carpeting to make sure that it is firmly attached to the stairs. Fix any carpet that is loose or worn. Avoid having throw rugs at the top or bottom of the stairs. If you do have throw rugs, attach them to the floor with carpet tape. Make sure that you have a light switch at the top of the stairs and the bottom of the stairs. If you do not have them, ask someone to add them for you. What else can I do to help prevent falls? Wear shoes that: Do not  have high heels. Have rubber bottoms. Are comfortable and fit you well. Are closed at the toe. Do not wear sandals. If you use a stepladder: Make sure that it is fully opened. Do not climb a closed stepladder. Make sure that both sides of the stepladder are locked into place. Ask someone to hold it for you, if possible. Clearly mark and make sure that you can see: Any grab bars or handrails. First and last steps. Where the edge of each step is. Use tools that help you move around (mobility aids) if they are needed. These include: Canes. Walkers. Scooters. Crutches. Turn on the lights when you go into a dark area. Replace any light bulbs as soon as they burn out. Set up your furniture so you have a clear path. Avoid moving your furniture around. If any of your floors are uneven, fix them. If there are any pets around you, be aware of where they are. Review your medicines with your doctor. Some medicines can make you feel dizzy. This can increase your chance of falling. Ask your doctor what other things that you can do to help prevent falls. This information is not intended to replace advice given to you by your health care provider. Make sure you discuss any questions you have with your health care provider. Document Released: 10/21/2008 Document Revised: 06/02/2015 Document Reviewed: 01/29/2014 Elsevier Interactive Patient Education  2017 [employer].

## 2020-07-20 ENCOUNTER — Ambulatory Visit (INDEPENDENT_AMBULATORY_CARE_PROVIDER_SITE_OTHER): Payer: Medicare Other

## 2020-07-20 DIAGNOSIS — E538 Deficiency of other specified B group vitamins: Secondary | ICD-10-CM | POA: Diagnosis not present

## 2020-07-20 MED ORDER — CYANOCOBALAMIN 1000 MCG/ML IJ SOLN
1000.0000 ug | Freq: Once | INTRAMUSCULAR | Status: AC
  Administered 2020-07-20: 1000 ug via INTRAMUSCULAR

## 2020-07-20 NOTE — Progress Notes (Signed)
Pt given Vitamin B12 injection w/o any complications.

## 2020-07-21 LAB — ENA+DNA/DS+SJORGEN'S
ENA RNP Ab: <0.2 AI (ref 0.0–0.9)
ENA SM Ab Ser-aCnc: <0.2 AI (ref 0.0–0.9)
ENA SSA (RO) Ab: >8 AI — ABNORMAL HIGH (ref 0.0–0.9)
ENA SSB (LA) Ab: <0.2 AI (ref 0.0–0.9)
dsDNA Ab: <1 IU/mL (ref 0–9)

## 2020-07-21 LAB — ANA W/REFLEX: Anti Nuclear Antibody (ANA): POSITIVE — AB

## 2020-07-21 LAB — MULTIPLE MYELOMA PANEL, SERUM
Albumin SerPl Elph-Mcnc: 3.9 g/dL (ref 2.9–4.4)
Albumin/Glob SerPl: 1.3 (ref 0.7–1.7)
Alpha 1: 0.2 g/dL (ref 0.0–0.4)
Alpha2 Glob SerPl Elph-Mcnc: 0.8 g/dL (ref 0.4–1.0)
B-Globulin SerPl Elph-Mcnc: 0.9 g/dL (ref 0.7–1.3)
Gamma Glob SerPl Elph-Mcnc: 1.3 g/dL (ref 0.4–1.8)
Globulin, Total: 3.2 g/dL (ref 2.2–3.9)
IgA/Immunoglobulin A, Serum: 137 mg/dL (ref 61–437)
IgG (Immunoglobin G), Serum: 1285 mg/dL (ref 603–1613)
IgM (Immunoglobulin M), Srm: 101 mg/dL (ref 15–143)
Total Protein: 7.1 g/dL (ref 6.0–8.5)

## 2020-07-21 LAB — LYME DISEASE SEROLOGY W/REFLEX: Lyme Total Antibody EIA: NEGATIVE

## 2020-07-21 LAB — COPPER, SERUM: Copper: 90 ug/dL (ref 69–132)

## 2020-07-21 LAB — VITAMIN B12: Vitamin B-12: 738 pg/mL (ref 232–1245)

## 2020-07-21 LAB — SEDIMENTATION RATE: Sed Rate: 13 mm/hr (ref 0–30)

## 2020-07-21 LAB — RPR: RPR Ser Ql: NONREACTIVE

## 2020-07-27 ENCOUNTER — Other Ambulatory Visit: Payer: Self-pay

## 2020-07-27 ENCOUNTER — Encounter (HOSPITAL_COMMUNITY)
Admission: RE | Admit: 2020-07-27 | Discharge: 2020-07-27 | Disposition: A | Discharge status: Home / Self Care | Payer: Medicare Other | Source: Ambulatory Visit | Attending: Nephrology | Admitting: Nephrology

## 2020-07-27 VITALS — BP 155/95 | HR 76 | Temp 97.5°F | Resp 20

## 2020-07-27 DIAGNOSIS — N183 Chronic kidney disease, stage 3 unspecified: Secondary | ICD-10-CM | POA: Diagnosis not present

## 2020-07-27 LAB — POCT HEMOGLOBIN-HEMACUE: Hemoglobin: 12.6 g/dL — ABNORMAL LOW (ref 13.0–17.0)

## 2020-07-27 MED ORDER — EPOETIN ALFA-EPBX 10000 UNIT/ML IJ SOLN: 20000.0000 [IU] | INTRAMUSCULAR | Status: DC

## 2020-08-01 NOTE — Progress Notes (Signed)
Remote pacemaker transmission.   

## 2020-08-07 NOTE — Progress Notes (Signed)
Subjective:    Patient ID: [name], male    DOB: Jun 21, 1926, 87 y.o.   MRN: [id number]  HPI The patient is here for an acute visit.   He still struggles with fatigue and SOB.  He has cardio, neuro, pulm.  He is frustrated because he does not understand the fatigue and shortness of breath.   B/l leg swelling and pain -   upper leg L> R pain.  This morning when he got up and went to recliner b/c he was restless. Around 8 am he felt something start from left knee up to hip and it hurt for 30 seconds,  he could see light but is not sure if he was dreaming.    His lower legs feel like they swelling.  He had some swelling last week and was on lasix -- he goes on and off of this for the swelling.  He did not get the swelling every 4-6 weeks.  He has been exercising regularly over the past 3 weeks.    He still coughs up mucus since covid.    ? Inguinal hernia on right side - recently has seen jabbing pain on right side - similar to left side.    Medications and allergies reviewed with patient and updated if appropriate.  Patient Active Problem List   Diagnosis Date Noted   Weakness of both lower extremities 04/09/2020   Anxiety and depression 04/09/2020   Numbness of left foot 10/21/2019   S/P inguinal hernia repair 08/13/2019   Left inguinal hernia 07/01/2019   Gout involving toe of left foot 05/12/2018   Myoclonic jerking 02/10/2018   Chronic diastolic CHF (congestive heart failure) ([place]) 02/05/2018   UGIB (upper gastrointestinal bleed) 02/01/2018   Lumbar post-laminectomy syndrome 12/12/2017   Pain of left hip joint 11/29/2017   Trochanteric bursitis of left hip 11/29/2017   Hyperpigmentation 11/04/2017   Spinal stenosis of lumbar region 10/09/2017   Abnormal appearance of skin 10/07/2017   Frequent urination 09/03/2017   Gouty arthritis of right great toe 09/03/2017   Postnasal drip 08/06/2017   Rash and nonspecific skin eruption 08/06/2017   Moderate aortic  regurgitation 62/56/3893   Diastolic dysfunction 73/42/8768   Edema 06/29/2017   SOB (shortness of breath) 06/29/2017   Sinus symptom 06/05/2017   Fatigue 05/18/2017   Sleeping difficulty 05/18/2017   Trochanteric bursitis 01/10/2017   Internal hemorrhoids 08/15/2016   Dysphagia 02/06/2016   Cephalalgia 07/14/2015   Constipation 03/01/2015   CHB (complete heart block) ([place]) 05/03/2012   Postoperative atrial fibrillation ([place]) 05/03/2012   Pacemaker 04/10/2012   AV block, 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 03/26/2012   Coronary atherosclerosis 11/27/2011   Presence of aortocoronary bypass graft 10/24/2011   Pulmonary nodule    CAD (coronary artery disease)    CKD (chronic kidney disease) stage 3, GFR 30-59 ml/min (HCC)    Venous insufficiency of leg 06/05/2010   SHOULDER PAIN, RIGHT, CHRONIC 10/14/2009   B12 deficiency 08/23/2009   Peripheral neuropathy 03/21/2009   DEGENERATIVE DISC DISEASE, CERVICAL SPINE, W/RADICULOPATHY 09/21/2008   Dyslipidemia 01/17/2007   Essential hypertension 07/20/2006   Prostate cancer ([place]) 07/20/2006    Current Outpatient Medications on File Prior to Visit  Medication Sig Dispense Refill   acetaminophen (TYLENOL) 500 MG tablet Take 500 mg by mouth every 6 (six) hours as needed.     allopurinol (ZYLOPRIM) 100 MG tablet TAKE 1 TABLET(100 MG) BY MOUTH DAILY 90 tablet 1   amLODipine (NORVASC) 5  MG tablet Take 1 tablet (5 mg total) by mouth daily. 90 tablet 2   Aromatic Inhalants (VICKS VAPOR INHALER IN) Place 1 puff into both nostrils as needed (for congestion).     aspirin EC 81 MG tablet Take 81 mg by mouth daily.     Calcium Citrate-Vitamin D (CITRACAL + D PO) Take 2 tablets by mouth in the morning and at bedtime.     Carboxymeth-Glycerin-Polysorb (REFRESH OPTIVE MEGA-3 OP) Place 1 drop into both eyes 2 (two) times daily.     clobetasol cream (TEMOVATE) 0.05 % Apply topically 2 (two) times daily.     epoetin alfa-epbx (RETACRIT) 2000 UNIT/ML  injection 2,000 Units every 14 (fourteen) days. Patient states he only gets it when he needs it but not every 2 weeks     folic acid (FOLVITE) 1 MG tablet Take 1 tablet (1 mg total) by mouth daily. Annual appt due in May must see provider for future refills 90 tablet 0   loratadine (CLARITIN) 10 MG tablet Take 10 mg by mouth daily as needed (for seasonal allergies).     nitroGLYCERIN (NITROSTAT) 0.4 MG SL tablet DISSOLVE 1 TABLET UNDER THE TONGUE EVERY 5 MINUTES AS NEEDED FOR CHEST PAIN, MAXIMUM 3 TABLETS 100 tablet 1   nortriptyline (PAMELOR) 10 MG capsule Take 1 capsule (10 mg total) by mouth at bedtime. 30 capsule 5   Saline (AYR NASAL MIST ALLERGY/SINUS NA) Place into the nose.     vitamin C (ASCORBIC ACID) 500 MG tablet Take 1,000 mg by mouth daily.      Vitamin D, Ergocalciferol, (DRISDOL) 1.25 MG (50000 UT) CAPS capsule Take 50,000 Units by mouth every 30 (thirty) days.      famotidine (PEPCID) 20 MG tablet Take 1 tablet (20 mg total) by mouth at bedtime. 30 tablet 0   Current Facility-Administered Medications on File Prior to Visit  Medication Dose Route Frequency Provider Last Rate Last Admin   NON FORMULARY 1 application  1 application Topical PRN Landis Martins, DPM        Past Medical History:  Diagnosis Date   Acute superficial venous thrombosis of lower extremity    a. RLE after CABG, neg dopp for DVT.   Allergy    Anemia    Atrial fibrillation ([place])    a. Post-op from CABG, on amiodarone temporarily, d/c'd 12/2011   CAD (coronary artery disease)    a. S/P stenting to mid RCA, prox PDA 06/1999. b. NSTEMI/CABG x 3 in 10/2011 with LIMA to LAD, SVG to PDA, and SVG to OM1.    Chronic UTI    a. Followed by Dr. Risa Grill - colonized/asymptomatic - not on abx   CKD (chronic kidney disease), stage III ([place])    a. stable with a creatinine around 1.9-2.0 followed by nephrology.   Dyslipidemia    Echocardiogram    Echocardiogram 08/2018: EF 50-55, inf-lat AK, mild LVH, Gr 1 DD, RVSP 47,  mild LAE, mild to mod MR, mild AI, mild AS (mean 11)   GERD (gastroesophageal reflux disease)    Hypertension    well-controlled.   Myocardial infarction ([place])    Neck injury    a. C3-C4 and C4-C5 foraminal narrowing, severe   Prostate cancer ([place])    a. 2001 s/p TURP.   Pulmonary nodule    a. felt to be noncancerous.  Status post followup CT scan 4 mm and stable.   Renal artery stenosis ([place])    a. 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    Past Surgical History:  Procedure Laterality Date   ANTERIOR CHAMBER WASHOUT Left 10/04/2018   Procedure: Anterior Chamber Washout, Vitreous Tap;  Surgeon: Jalene Mullet, MD;  Location: [facility];  Service: Ophthalmology;  Laterality: Left;   cardia catherization  07-07-99   CARDIAC SURGERY  10/18/12   open heart surgery   CATARACT EXTRACTION W/ INTRAOCULAR LENS  IMPLANT, BILATERAL  3/205, 06/2013   [facility]   COLONOSCOPY  04/12/07   CORONARY ARTERY BYPASS GRAFT  10/19/2011   Procedure: CORONARY ARTERY BYPASS GRAFTING (CABG);  Surgeon: Gaye Pollack, MD;  Location: [facility];  Service: Open Heart Surgery;  Laterality: N/A;  times three using Left Internal Mammary Artery and Right Greater [place]hen[place] Vein Graft harvested Endoscopically   edg  07-17-1994   FLEXIBLE SIGMOIDOSCOPY  11-03-1997   GAS INSERTION Left 10/04/2018   Procedure: Insertion Of Gas;  Surgeon: Jalene Mullet, MD;  Location: [facility];  Service: Ophthalmology;  Laterality: Left;   GAS/FLUID EXCHANGE Left 10/04/2018   Procedure: Gas/Fluid Exchange;  Surgeon: Jalene Mullet, MD;  Location: [facility];  Service: Ophthalmology;  Laterality: Left;   LEFT HEART CATHETERIZATION WITH CORONARY ANGIOGRAM N/A 10/16/2011   Procedure: LEFT HEART CATHETERIZATION WITH CORONARY ANGIOGRAM;  Surgeon: Peter M Martinique, MD;  Location: [facility] CATH LAB;  Service: Cardiovascular;  Laterality: N/A;   LEFT HEART CATHETERIZATION WITH CORONARY/GRAFT ANGIOGRAM N/A 03/11/2013    Procedure: LEFT HEART CATHETERIZATION WITH [name];  Surgeon: Blane Ohara, MD;  [facility] C[facility] LAB;  Service: Cardiovascular;  Laterality: N/A;   lumbar spinal disk and neck fusion surgery     PACEMAKER INSERTION  03/28/12   PPM implanted for mobitz II AV block   PARS PLANA VITRECTOMY Left 10/04/2018   Procedure: PARS PLANA VITRECTOMY 25 GAUGE FOR ENDOPHTHALMITIS WITH INJECTION OF INTRAVITREAL ANTIBIOTIC;  Surgeon: Jalene Mullet, MD;  Location: [facility];  Service: Ophthalmology;  Laterality: Left;   peripheral vascular catherization  11-24-03   PERMANENT PACEMAKER INSERTION N/A 03/28/2012   Procedure: PERMANENT PACEMAKER INSERTION;  Surgeon: Thompson Grayer, MD;  Location: [facility] CATH LAB;  Service: Cardiovascular;  Laterality: N/A;   PROSTATECTOMY     renal circulation  10-01-03   s/p ptca     stents     X 2   stress cardiolite  05-04-05   spring 09-negative except for apical thinning, EF 68%    Social History   Socioeconomic History   Marital status: Married    Spouse name: [name]   Number of children: 2   Years of education: Not on file   Highest education level: Some college, no degree  Occupational History   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  married '52 - 3 years, divorced; married '56 - 3 years divorced; married '20-12 yrs - divorced; married '75 -. 1 son '57; 1 daughter - '53; 1 grandchild.  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  Very happily married.  End of life care:  yes CPR, no long term mechanical ventilation, no heroic measures.right handed   Right handed   Social Determinants of Health   Financial Resource Strain: Low Risk    Difficulty of Paying Living Expenses: Not hard at all  Food Insecurity: No Food Insecurity   Worried About Charity fundraiser in the Last Year: Never true   Ran Out of Food in the Last Year: Never true  Transportation Needs: No Transportation Needs   Lack of Transportation (Medical): No   Lack of Transportation (Non-Medical): No  Physical Activity: Sufficiently Active   Days of Exercise per Week: 5 days   Minutes of Exercise per Session: 30 min  Stress: No Stress Concern Present   Feeling of Stress : Not at all  Social Connections: Socially Integrated   Frequency of Communication with Friends and Family: More than three times a week   Frequency of Social Gatherings with Friends and Family: Once a week   Attends Religious Services: More than 4 times per year   Active Member of [employer] or Organizations: Yes   Attends Music therapist: More than 4 times per year   Marital Status: Married    Family History  Problem Relation Age of Onset   Coronary artery disease Father        died @ 47   Other Mother        cerebral hemorrhage - died @ 43   Arthritis Mother    Cancer Brother        Bladder   Prostate cancer Brother    Nephritis Brother        died @ age 32.   Other Brother        cerebral hemorrhage - died @ 37   Heart disease Brother    Breast cancer Other        niece x 2   Diabetes Neg Hx    Colon cancer Neg Hx    Adrenal disorder Neg Hx     Review of Systems  Constitutional:  Positive for fatigue. Negative for appetite change and fever.  Respiratory:  Positive for cough (productive of mucus since covid) and shortness of breath (with exertion). Negative for wheezing.   Cardiovascular:  Positive for leg swelling (every 4-6 weeks - lasix prn). Negative for chest pain and palpitations.   Neurological:  Positive for weakness (sometimes leg weakness with walking) and light-headedness (occ). Negative for headaches.      Objective:   Vitals:   08/08/20 1349  BP: 128/72  Pulse: 81  Temp: 98.6 F (37 C)  SpO2: 99%   BP Readings from Last 3 Encounters:  08/08/20 128/72  07/27/20 (!) 155/95  07/19/20 126/70   Wt Readings from Last 3 Encounters:  08/08/20 156 lb 3.2 oz (70.9 kg)  07/19/20 152 lb 9.6 oz (69.2 kg)  07/15/20 151 lb (68.5 kg)   Body mass index is 22.41 kg/m.   Physical Exam    Constitutional: Appears well-developed and well-nourished. No distress.  Head: Normocephalic and atraumatic.  Neck: Neck supple. No tracheal deviation present. No thyromegaly present.  No cervical lymphadenopathy Cardiovascular: Normal rate, regular rhythm and normal heart sounds.  2/6 sys murmur heard.   No edema Pulmonary/Chest: Effort normal and breath sounds normal. No respiratory distress. No has no wheezes. No rales.  Skin: Skin is warm and dry. Not  diaphoretic.  Psychiatric: Normal mood and affect. Behavior is normal.       Assessment & Plan:    See Problem List for Assessment and Plan of chronic medical problems.    This visit occurred during the SARS-CoV-2 public health emergency.  Safety protocols were in place, including screening questions prior to the visit, additional usage of staff PPE, and extensive cleaning of exam room while observing appropriate contact time as indicated for disinfecting solutions.

## 2020-08-08 ENCOUNTER — Other Ambulatory Visit: Payer: Self-pay

## 2020-08-08 ENCOUNTER — Encounter: Payer: Self-pay | Admitting: Internal Medicine

## 2020-08-08 ENCOUNTER — Ambulatory Visit (INDEPENDENT_AMBULATORY_CARE_PROVIDER_SITE_OTHER): Payer: Medicare Other | Admitting: Internal Medicine

## 2020-08-08 DIAGNOSIS — M10372 Gout due to renal impairment, left ankle and foot: Secondary | ICD-10-CM

## 2020-08-08 DIAGNOSIS — I2510 Atherosclerotic heart disease of native coronary artery without angina pectoris: Secondary | ICD-10-CM | POA: Diagnosis not present

## 2020-08-08 DIAGNOSIS — R5383 Other fatigue: Secondary | ICD-10-CM

## 2020-08-08 DIAGNOSIS — I10 Essential (primary) hypertension: Secondary | ICD-10-CM

## 2020-08-08 MED ORDER — FUROSEMIDE 40 MG PO TABS: 40.0000 mg | ORAL_TABLET | Freq: Every day | ORAL | 0 refills | Status: DC | PRN

## 2020-08-08 NOTE — Patient Instructions (Addendum)
   Medications changes include :   hold nortriptyline for a few days to see if that helps with your symptoms.      Please followup in 3 months

## 2020-08-08 NOTE — Assessment & Plan Note (Signed)
Chronic Well-controlled No recent gout symptoms Continue allopurinol 100 mg daily

## 2020-08-08 NOTE — Assessment & Plan Note (Signed)
Chronic Blood pressure very well controlled Continue amlodipine 5 mg daily

## 2020-08-08 NOTE — Assessment & Plan Note (Signed)
Chronic Has seen cardiology, pulmonary, neurology, rheumatology and also follows with urology and nephrology No obvious pulmonary or cardiology cause He is exercising regularly for the past 3 weeks and still feels fatigued Likely multifactorial Low testosterone likely contributing-encouraged to discuss with urology to confirm this is contributing Advised him to discuss with nephrology his fatigue and if his chronic kidney disease is contributing.  Anemia is improved and minimal at this time Advised him to hold the nortriptyline for several days to see how much that is contributing since his fatigue seems to be worse first thing in the morning and improves as the day goes on

## 2020-08-10 ENCOUNTER — Other Ambulatory Visit: Payer: Self-pay

## 2020-08-10 ENCOUNTER — Encounter (HOSPITAL_COMMUNITY)
Admission: RE | Admit: 2020-08-10 | Discharge: 2020-08-10 | Disposition: A | Discharge status: Home / Self Care | Payer: Medicare Other | Source: Ambulatory Visit | Attending: Nephrology | Admitting: Nephrology

## 2020-08-10 VITALS — BP 136/72 | HR 73 | Temp 98.4°F

## 2020-08-10 DIAGNOSIS — D631 Anemia in chronic kidney disease: Secondary | ICD-10-CM | POA: Diagnosis not present

## 2020-08-10 DIAGNOSIS — N183 Chronic kidney disease, stage 3 unspecified: Secondary | ICD-10-CM | POA: Insufficient documentation

## 2020-08-10 LAB — IRON AND TIBC
Iron: 89 ug/dL (ref 45–182)
Saturation Ratios: 33 % (ref 17.9–39.5)
TIBC: 272 ug/dL (ref 250–450)
UIBC: 183 ug/dL

## 2020-08-10 LAB — FERRITIN: Ferritin: 269 ng/mL (ref 24–336)

## 2020-08-10 LAB — POCT HEMOGLOBIN-HEMACUE: Hemoglobin: 11.1 g/dL — ABNORMAL LOW (ref 13.0–17.0)

## 2020-08-10 MED ORDER — EPOETIN ALFA-EPBX 10000 UNIT/ML IJ SOLN
20000.0000 [IU] | INTRAMUSCULAR | Status: DC
  Administered 2020-08-10: 20000 [IU] via SUBCUTANEOUS

## 2020-08-10 MED ORDER — EPOETIN ALFA-EPBX 10000 UNIT/ML IJ SOLN
INTRAMUSCULAR | Status: AC
  Filled 2020-08-10: qty 2

## 2020-08-23 ENCOUNTER — Telehealth: Payer: Self-pay | Admitting: Neurology

## 2020-08-23 NOTE — Telephone Encounter (Signed)
Pt called wanting someone to go over his labs with him that he had done back in July. Pt requesting a call back.

## 2020-08-23 NOTE — Telephone Encounter (Addendum)
Kathrynn Ducking, MD  07/21/2020  4:57 PM EDT Back to Top    Blood work is relatively unremarkable with exception of a positive ANA.  The antibody panel associated with this shows a single antibody elevation, the SSA antibody.  Oftentimes a single antibody elevation is not clinically relevant, small possibility that there may be the presence of Sjogren's syndrome which can cause dry mouth or dry eyes, but this likely does not relate to your current problem.  I called pt back and we reviewed results of the labs. He reports he still having trouble with the electrical impulses shooting up and down his leg. Pt reports sx are still intermittent, but sill bothersome.  Pt requested to be added to the schedule for Dr. Jannifer Franklin for re-eval of his sx.   Pt has been scheduled for 09/01/2020 at 12 pm.

## 2020-08-24 ENCOUNTER — Ambulatory Visit: Payer: Medicare Other | Admitting: Internal Medicine

## 2020-08-24 ENCOUNTER — Encounter (HOSPITAL_COMMUNITY)
Admission: RE | Admit: 2020-08-24 | Discharge: 2020-08-24 | Disposition: A | Discharge status: Home / Self Care | Payer: Medicare Other | Source: Ambulatory Visit | Attending: Nephrology | Admitting: Nephrology

## 2020-08-24 ENCOUNTER — Other Ambulatory Visit: Payer: Self-pay

## 2020-08-24 ENCOUNTER — Ambulatory Visit (INDEPENDENT_AMBULATORY_CARE_PROVIDER_SITE_OTHER): Payer: Medicare Other

## 2020-08-24 VITALS — BP 147/75 | HR 73 | Resp 73

## 2020-08-24 DIAGNOSIS — E538 Deficiency of other specified B group vitamins: Secondary | ICD-10-CM

## 2020-08-24 DIAGNOSIS — N183 Chronic kidney disease, stage 3 unspecified: Secondary | ICD-10-CM | POA: Diagnosis not present

## 2020-08-24 LAB — POCT HEMOGLOBIN-HEMACUE: Hemoglobin: 11.5 g/dL — ABNORMAL LOW (ref 13.0–17.0)

## 2020-08-24 MED ORDER — EPOETIN ALFA-EPBX 10000 UNIT/ML IJ SOLN: 20000.0000 [IU] | INTRAMUSCULAR | Status: DC

## 2020-08-24 MED ORDER — EPOETIN ALFA-EPBX 10000 UNIT/ML IJ SOLN
INTRAMUSCULAR | Status: AC
  Administered 2020-08-24: 20000 [IU] via SUBCUTANEOUS
  Filled 2020-08-24: qty 2

## 2020-08-24 MED ORDER — CYANOCOBALAMIN 1000 MCG/ML IJ SOLN
1000.0000 ug | Freq: Once | INTRAMUSCULAR | Status: AC
  Administered 2020-08-24: 1000 ug via INTRAMUSCULAR

## 2020-08-24 NOTE — Progress Notes (Signed)
Pt given B12 w/o any complications. °

## 2020-09-01 ENCOUNTER — Ambulatory Visit (INDEPENDENT_AMBULATORY_CARE_PROVIDER_SITE_OTHER): Payer: Medicare Other | Admitting: Neurology

## 2020-09-01 ENCOUNTER — Encounter: Payer: Self-pay | Admitting: Neurology

## 2020-09-01 ENCOUNTER — Other Ambulatory Visit: Payer: Self-pay

## 2020-09-01 VITALS — BP 138/81 | HR 79 | Ht 70.0 in | Wt 155.0 lb

## 2020-09-01 DIAGNOSIS — R5383 Other fatigue: Secondary | ICD-10-CM | POA: Diagnosis not present

## 2020-09-01 DIAGNOSIS — I2510 Atherosclerotic heart disease of native coronary artery without angina pectoris: Secondary | ICD-10-CM

## 2020-09-01 NOTE — Progress Notes (Signed)
Reason for visit: Fatigue, shortness of breath  [name] is an 87 y.o. male  History of present illness:  [name] is a 87 year old right-handed white male with a history of sensation of muscle fatigue.  The patient was seen just recently, he was not found to have any true weakness of the extremities but he feels as if his legs are weak or tight when he tries to walk or exercise.  He is engaged in a muscle toning exercise program, he works on toning up muscles in his arms and legs.  He tries to walk on a regular basis but he notes that he gets short winded if he walks more than 350 steps or so.  The patient may occasionally have sharp shooting pains in the thigh area, left greater than right, occasionally this may wake him up at night.  The episodes are only occasional however and are quite brief.  He denies any falls.  He has been evaluated for his shortness of breath, no definite cause of this was noted.  The patient does have a moderate level of aortic stenosis.  He indicates that the shortness of breath is a limiting factor in his ability to perform physical activity.  He denies any muscle cramps in the legs.  He returns here for further evaluation.  Past Medical History:  Diagnosis Date   Acute superficial venous thrombosis of lower extremity    a. RLE after CABG, neg dopp for DVT.   Allergy    Anemia    Atrial fibrillation ([place])    a. Post-op from CABG, on amiodarone temporarily, d/c'd 12/2011   CAD (coronary artery disease)    a. S/P stenting to mid RCA, prox PDA 06/1999. b. NSTEMI/CABG x 3 in 10/2011 with LIMA to LAD, SVG to PDA, and SVG to OM1.    Chronic UTI    a. Followed by Dr. Risa Grill - colonized/asymptomatic - not on abx   CKD (chronic kidney disease), stage III ([place])    a. stable with a creatinine around 1.9-2.0 followed by nephrology.   Dyslipidemia    Echocardiogram    Echocardiogram 08/2018: EF 50-55, inf-lat AK, mild LVH, Gr 1 DD, RVSP 47, mild LAE, mild  to mod MR, mild AI, mild AS (mean 11)   GERD (gastroesophageal reflux disease)    Hypertension    well-controlled.   Myocardial infarction ([place])    Neck injury    a. C3-C4 and C4-C5 foraminal narrowing, severe   Prostate cancer ([place])    a. 2001 s/p TURP.   Pulmonary nodule    a. felt to be noncancerous.  Status post followup CT scan 4 mm and stable.   Renal artery stenosis ([facility])    a. 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    Past Surgical History:  Procedure Laterality Date   ANTERIOR CHAMBER WASHOUT Left 10/04/2018   Procedure: Anterior Chamber Washout, Vitreous Tap;  Surgeon: Jalene Mullet, MD;  Location: [place];  Service: Ophthalmology;  Laterality: Left;   cardia catherization  07-07-99   CARDIAC SURGERY  10/18/12   open heart surgery   CATARACT EXTRACTION W/ INTRAOCULAR LENS  IMPLANT, BILATERAL  3/205, 06/2013   [facility]   COLONOSCOPY  04/12/07   CORONARY ARTERY BYPASS GRAFT  10/19/2011   Procedure: CORONARY ARTERY BYPASS GRAFTING (CABG);  Surgeon: Gaye Pollack, MD;  Location: [place];  Service: Open Heart Surgery;  Laterality:  N/A;  times three using Left Internal Mammary Artery and Right Greater Saphenouse Vein Graft harvested Endoscopically   edg  07-17-1994   FLEXIBLE SIGMOIDOSCOPY  11-03-1997   GAS INSERTION Left 10/04/2018   Procedure: Insertion Of Gas;  Surgeon: Jalene Mullet, MD;  Location: [place];  Service: Ophthalmology;  Laterality: Left;   GAS/FLUID EXCHANGE Left 10/04/2018   Procedure: Gas/Fluid Exchange;  Surgeon: Jalene Mullet, MD;  Location: [place];  Service: Ophthalmology;  Laterality: Left;   LEFT HEART CATHETERIZATION WITH CORONARY ANGIOGRAM N/A 10/16/2011   Procedure: LEFT HEART CATHETERIZATION WITH CORONARY ANGIOGRAM;  Surgeon: Peter M Martinique, MD;  Location[facility] C[facility];  Service: Cardiovascular;  Laterality: N/A;   LEFT HEART CATHETERIZATION WITH CORONARY/GRAFT ANGIOGRAM N/A 03/11/2013   Procedure: LEFT  HEART CATHETERIZATION WITH [name];  Surgeon: Blane Ohara, MD;  Location: [facility] CATH LAB;  Service: Cardiovascular;  Laterality: N/A;   lumbar spinal disk and neck fusion surgery     PACEMAKER INSERTION  03/28/12   PPM implanted for mobitz II AV block   PARS PLANA VITRECTOMY Left 10/04/2018   Procedure: PARS PLANA VITRECTOMY 25 GAUGE FOR ENDOPHTHALMITIS WITH INJECTION OF INTRAVITREAL ANTIBIOTIC;  Surgeon: Jalene Mullet, MD;  Location: [place];  Service: Ophthalmology;  Laterality: Left;   peripheral vascular catherization  11-24-03   PERMANENT PACEMAKER INSERTION N/A 03/28/2012   Procedure: PERMANENT PACEMAKER INSERTION;  Surgeon: Thompson Grayer, MD;  Location: [facility] CATH LAB;  Service: Cardiovascular;  Laterality: N/A;   PROSTATECTOMY     renal circulation  10-01-03   s/p ptca     stents     X 2   stress cardiolite  05-04-05   spring 09-negative except for apical thinning, EF 68%    Family History  Problem Relation Age of Onset   Coronary artery disease Father        died @ 16   Other Mother        cerebral hemorrhage - died @ 27   Arthritis Mother    Cancer Brother        Bladder   Prostate cancer Brother    Nephritis Brother        died @ age 65.   Other Brother        cerebral hemorrhage - died @ 41   Heart disease Brother    Breast cancer Other        niece x 2   Diabetes Neg Hx    Colon cancer Neg Hx    Adrenal disorder Neg Hx     Social history:  reports that he has never smoked. He has never used smokeless tobacco. He reports current alcohol use. He reports that he does not use drugs.    Allergies  Allergen Reactions   Aspirin Nausea And Vomiting and Other (See Comments)    Upset stomach- tolerates coated aspirin    Lisinopril Anaphylaxis and Shortness Of Breath    After 3 tablets, he experienced trouble swallowing, throat tightness and hoarseness.    Amoxicillin Nausea Only   Atarax [Hydroxyzine Hcl] Nausea And Vomiting   Cephalexin Diarrhea    Ciprofloxacin Nausea Only   Clindamycin Other (See Comments)    Reaction not recalled   Clobetasol Other (See Comments)    Not effective   Codeine Nausea Only and Other (See Comments)    Stomach upset   Fish Allergy Nausea And Vomiting   Fluarix [Influenza Virus Vaccine] Itching   Haemophilus Influenzae Itching   Hydrocodone Nausea Only  Hydrocodone-Acetaminophen Nausea And Vomiting   Hydroxyzine Nausea And Vomiting   Latex Itching and Other (See Comments)    (After flu shot)   Macrobid [Nitrofurantoin Macrocrystal] Nausea Only   Niacin Other (See Comments)    Unknown reaction   Niacin-Lovastatin Er Other (See Comments)    Unsure of reaction. Taking simvastatin at home without problems   Niacin-Lovastatin Er Other (See Comments)    Reaction not recalled   Nitrofurantoin Other (See Comments)    Upset stomach    Omeprazole Other (See Comments)    Reaction not recalled- stopped by MD   Other Nausea And Vomiting   Vibramycin [Doxycycline] Other (See Comments)    Reaction not recalled   Adhesive [Tape] Rash   Bactrim [Sulfamethoxazole-Trimethoprim] Rash   Colchicine Rash   Gabapentin     Painful pimples on tongue   Nortriptyline Rash    Medications:  Prior to Admission medications   Medication Sig Start Date End Date Taking? Authorizing Provider  acetaminophen (TYLENOL) 500 MG tablet Take 500 mg by mouth every 6 (six) hours as needed.   Yes [provider]  allopurinol (ZYLOPRIM) 100 MG tablet TAKE 1 TABLET(100 MG) BY MOUTH DAILY 07/04/20  Yes Burns, Claudina Lick, MD  amLODipine (NORVASC) 5 MG tablet Take 1 tablet (5 mg total) by mouth daily. 05/05/20  Yes [name], MD  Aromatic Inhalants (VICKS VAPOR INHALER IN) Place 1 puff into both nostrils as needed (for congestion).   Yes [provider]  aspirin EC 81 MG tablet Take 81 mg by mouth daily.   Yes [provider]  Calcium Citrate-Vitamin D (CITRACAL + D PO) Take 2 tablets by mouth in the morning  and at bedtime.   Yes [provider]  Carboxymeth-Glycerin-Polysorb (REFRESH OPTIVE MEGA-3 OP) Place 1 drop into both eyes 2 (two) times daily.   Yes [provider]  clobetasol cream (TEMOVATE) 0.05 % Apply topically 2 (two) times daily. 08/04/20  Yes [provider]  epoetin alfa-epbx (RETACRIT) 2000 UNIT/ML injection 2,000 Units every 14 (fourteen) days. Patient states he only gets it when he needs it but not every 2 weeks   Yes [provider]  folic acid (FOLVITE) 1 MG tablet Take 1 tablet (1 mg total) by mouth daily. Annual appt due in May must see provider for future refills 02/02/19  Yes Burns, Claudina Lick, MD  furosemide (LASIX) 40 MG tablet Take 1 tablet (40 mg total) by mouth daily as needed for edema. 08/08/20 09/07/20 Yes Burns, Claudina Lick, MD  loratadine (CLARITIN) 10 MG tablet Take 10 mg by mouth daily as needed (for seasonal allergies).   Yes [provider]  nitroGLYCERIN (NITROSTAT) 0.4 MG SL tablet DISSOLVE 1 TABLET UNDER THE TONGUE EVERY 5 MINUTES AS NEEDED FOR CHEST PAIN, MAXIMUM 3 TABLETS 04/30/19  Yes Burns, Claudina Lick, MD  Saline (AYR NASAL MIST ALLERGY/SINUS NA) Place into the nose.   Yes [provider]  vitamin C (ASCORBIC ACID) 500 MG tablet Take 1,000 mg by mouth daily.    Yes [provider]  Vitamin D, Ergocalciferol, (DRISDOL) 1.25 MG (50000 UT) CAPS capsule Take 50,000 Units by mouth every 30 (thirty) days.    Yes [provider]    ROS:  Out of a complete 14 system review of symptoms, the patient complains only of the following symptoms, and all other reviewed systems are negative.  Fatigue Shortness of breath, dyspnea on exertion  Blood pressure 138/81, pulse 79, height 5\' 10"  (1.778  m), weight 155 lb (70.3 kg).  Physical Exam  General: The patient is alert and cooperative at the time of the examination.  Skin: No significant peripheral edema is noted.   Neurologic Exam  Mental status: The  patient is alert and oriented x 3 at the time of the examination. The patient has apparent normal recent and remote memory, with an apparently normal attention span and concentration ability.   Cranial nerves: Facial symmetry is present. Speech is normal, no aphasia or dysarthria is noted. Extraocular movements are full. Visual fields are full.  Motor: The patient has good strength in all 4 extremities.  Sensory examination: Soft touch sensation is symmetric on the face, arms, and legs.  Coordination: The patient has good finger-nose-finger and heel-to-shin bilaterally.  Gait and station: The patient has the ability to arise from a seated position with arms crossed.  Once up, he can walk independently but normally uses a cane.  He has a slightly wide-based gait.  Romberg is negative, no drift is seen.  Reflexes: Deep tendon reflexes are symmetric, but are depressed.   Assessment/Plan:  1.  Muscle fatigue  2.  Dyspnea on exertion  3.  Occasional lancinating pains, left greater than right thigh  The patient does not wish to go on any medications for the thigh pain.  He reports a sensation of muscle fatigue, but he has no true weakness on clinical examination.  He does have a history of a low testosterone level which may present as a chronic fatigue syndrome.  He is limited in his activity level secondary to dyspnea, not clear if this is related to the aortic stenosis or not.  I have encouraged him to continue with his ongoing exercise program, he feels that this is helping him some.  The patient will follow up here in 6 months, in the future he can be seen through Dr. Krista Blue.  Jill Alexanders MD 09/01/2020 12:44 PM  [facility] [address] [place], [place] [address]  Phone [phone] Fax [phone]

## 2020-09-06 ENCOUNTER — Encounter: Payer: Self-pay | Admitting: Internal Medicine

## 2020-09-07 ENCOUNTER — Encounter (HOSPITAL_COMMUNITY)
Admission: RE | Admit: 2020-09-07 | Discharge: 2020-09-07 | Disposition: A | Discharge status: Home / Self Care | Payer: Medicare Other | Source: Ambulatory Visit | Attending: Nephrology | Admitting: Nephrology

## 2020-09-07 ENCOUNTER — Encounter (HOSPITAL_COMMUNITY): Payer: Medicare Other

## 2020-09-07 VITALS — BP 124/56 | HR 69 | Resp 20

## 2020-09-07 DIAGNOSIS — N183 Chronic kidney disease, stage 3 unspecified: Secondary | ICD-10-CM | POA: Diagnosis not present

## 2020-09-07 LAB — POCT HEMOGLOBIN-HEMACUE: Hemoglobin: 12.6 g/dL — ABNORMAL LOW (ref 13.0–17.0)

## 2020-09-07 MED ORDER — EPOETIN ALFA-EPBX 10000 UNIT/ML IJ SOLN: 20000.0000 [IU] | INTRAMUSCULAR | Status: DC

## 2020-09-20 ENCOUNTER — Other Ambulatory Visit: Payer: Self-pay

## 2020-09-20 ENCOUNTER — Encounter (HOSPITAL_COMMUNITY)
Admission: RE | Admit: 2020-09-20 | Discharge: 2020-09-20 | Disposition: A | Discharge status: Home / Self Care | Payer: Medicare Other | Source: Ambulatory Visit | Attending: Nephrology | Admitting: Nephrology

## 2020-09-20 VITALS — BP 149/87 | HR 79 | Temp 98.6°F | Resp 18

## 2020-09-20 DIAGNOSIS — D631 Anemia in chronic kidney disease: Secondary | ICD-10-CM | POA: Diagnosis not present

## 2020-09-20 DIAGNOSIS — N183 Chronic kidney disease, stage 3 unspecified: Secondary | ICD-10-CM | POA: Diagnosis not present

## 2020-09-20 LAB — POCT HEMOGLOBIN-HEMACUE: Hemoglobin: 11.8 g/dL — ABNORMAL LOW (ref 13.0–17.0)

## 2020-09-20 LAB — IRON AND TIBC
Iron: 71 ug/dL (ref 45–182)
Saturation Ratios: 27 % (ref 17.9–39.5)
TIBC: 259 ug/dL (ref 250–450)
UIBC: 188 ug/dL

## 2020-09-20 LAB — FERRITIN: Ferritin: 233 ng/mL (ref 24–336)

## 2020-09-20 MED ORDER — EPOETIN ALFA-EPBX 10000 UNIT/ML IJ SOLN
INTRAMUSCULAR | Status: AC
  Filled 2020-09-20: qty 2

## 2020-09-20 MED ORDER — EPOETIN ALFA-EPBX 10000 UNIT/ML IJ SOLN
20000.0000 [IU] | INTRAMUSCULAR | Status: DC
  Administered 2020-09-20: 20000 [IU] via SUBCUTANEOUS

## 2020-09-21 ENCOUNTER — Encounter (HOSPITAL_COMMUNITY): Payer: Medicare Other

## 2020-09-22 ENCOUNTER — Other Ambulatory Visit: Payer: Self-pay

## 2020-09-22 ENCOUNTER — Ambulatory Visit (INDEPENDENT_AMBULATORY_CARE_PROVIDER_SITE_OTHER): Payer: Medicare Other

## 2020-09-22 DIAGNOSIS — E538 Deficiency of other specified B group vitamins: Secondary | ICD-10-CM | POA: Diagnosis not present

## 2020-09-22 DIAGNOSIS — Z23 Encounter for immunization: Secondary | ICD-10-CM

## 2020-09-22 MED ORDER — CYANOCOBALAMIN 1000 MCG/ML IJ SOLN
1000.0000 ug | Freq: Once | INTRAMUSCULAR | Status: AC
  Administered 2020-09-22: 1000 ug via INTRAMUSCULAR

## 2020-09-22 NOTE — Progress Notes (Addendum)
Pt was given L91 w/o any complications. High dose Flu vaccine given w/o complications.

## 2020-09-22 NOTE — Addendum Note (Signed)
Addended by: Marijean Heath R on: 09/22/2020 02:32 PM   Modules accepted: Orders

## 2020-10-03 ENCOUNTER — Other Ambulatory Visit: Payer: Self-pay

## 2020-10-03 ENCOUNTER — Encounter (HOSPITAL_COMMUNITY): Payer: Self-pay | Admitting: *Deleted

## 2020-10-03 ENCOUNTER — Telehealth: Payer: Self-pay | Admitting: Internal Medicine

## 2020-10-03 ENCOUNTER — Emergency Department (HOSPITAL_COMMUNITY)
Admit: 2020-10-03 | Discharge: 2020-10-03 | Disposition: A | Discharge status: Home / Self Care | Payer: Medicare Other | Attending: Emergency Medicine | Admitting: Emergency Medicine

## 2020-10-03 ENCOUNTER — Emergency Department (HOSPITAL_COMMUNITY)
Admission: EM | Admit: 2020-10-03 | Discharge: 2020-10-03 | Disposition: A | Discharge status: Home / Self Care | Payer: Medicare Other | Attending: Emergency Medicine | Admitting: Emergency Medicine

## 2020-10-03 DIAGNOSIS — M79605 Pain in left leg: Secondary | ICD-10-CM | POA: Diagnosis not present

## 2020-10-03 DIAGNOSIS — Z79899 Other long term (current) drug therapy: Secondary | ICD-10-CM | POA: Diagnosis not present

## 2020-10-03 DIAGNOSIS — N183 Chronic kidney disease, stage 3 unspecified: Secondary | ICD-10-CM | POA: Diagnosis not present

## 2020-10-03 DIAGNOSIS — I2510 Atherosclerotic heart disease of native coronary artery without angina pectoris: Secondary | ICD-10-CM | POA: Insufficient documentation

## 2020-10-03 DIAGNOSIS — Z8546 Personal history of malignant neoplasm of prostate: Secondary | ICD-10-CM | POA: Insufficient documentation

## 2020-10-03 DIAGNOSIS — Z7982 Long term (current) use of aspirin: Secondary | ICD-10-CM | POA: Insufficient documentation

## 2020-10-03 DIAGNOSIS — Z91040 Latex allergy status: Secondary | ICD-10-CM | POA: Diagnosis not present

## 2020-10-03 DIAGNOSIS — R202 Paresthesia of skin: Secondary | ICD-10-CM | POA: Diagnosis not present

## 2020-10-03 DIAGNOSIS — M79604 Pain in right leg: Secondary | ICD-10-CM

## 2020-10-03 DIAGNOSIS — G629 Polyneuropathy, unspecified: Secondary | ICD-10-CM | POA: Insufficient documentation

## 2020-10-03 DIAGNOSIS — I5032 Chronic diastolic (congestive) heart failure: Secondary | ICD-10-CM | POA: Diagnosis not present

## 2020-10-03 DIAGNOSIS — M7989 Other specified soft tissue disorders: Secondary | ICD-10-CM | POA: Diagnosis present

## 2020-10-03 DIAGNOSIS — I130 Hypertensive heart and chronic kidney disease with heart failure and stage 1 through stage 4 chronic kidney disease, or unspecified chronic kidney disease: Secondary | ICD-10-CM | POA: Diagnosis not present

## 2020-10-03 LAB — CBC WITH DIFFERENTIAL/PLATELET
Abs Immature Granulocytes: 0.02 10*3/uL (ref 0.00–0.07)
Basophils Absolute: 0.1 10*3/uL (ref 0.0–0.1)
Basophils Relative: 1 %
Eosinophils Absolute: 0.1 10*3/uL (ref 0.0–0.5)
Eosinophils Relative: 2 %
HCT: 40.3 % (ref 39.0–52.0)
Hemoglobin: 12.2 g/dL — ABNORMAL LOW (ref 13.0–17.0)
Immature Granulocytes: 0 %
Lymphocytes Relative: 18 %
Lymphs Abs: 1 10*3/uL (ref 0.7–4.0)
MCH: 25.7 pg — ABNORMAL LOW (ref 26.0–34.0)
MCHC: 30.3 g/dL (ref 30.0–36.0)
MCV: 84.8 fL (ref 80.0–100.0)
Monocytes Absolute: 0.7 10*3/uL (ref 0.1–1.0)
Monocytes Relative: 12 %
Neutro Abs: 3.7 10*3/uL (ref 1.7–7.7)
Neutrophils Relative %: 67 %
Platelets: 237 10*3/uL (ref 150–400)
RBC: 4.75 MIL/uL (ref 4.22–5.81)
RDW: 18.2 % — ABNORMAL HIGH (ref 11.5–15.5)
WBC: 5.5 10*3/uL (ref 4.0–10.5)
nRBC: 0 % (ref 0.0–0.2)

## 2020-10-03 LAB — BASIC METABOLIC PANEL
Anion gap: 10 (ref 5–15)
BUN: 44 mg/dL — ABNORMAL HIGH (ref 8–23)
CO2: 22 mmol/L (ref 22–32)
Calcium: 9.6 mg/dL (ref 8.9–10.3)
Chloride: 106 mmol/L (ref 98–111)
Creatinine, Ser: 2 mg/dL — ABNORMAL HIGH (ref 0.61–1.24)
GFR, Estimated: 31 mL/min — ABNORMAL LOW (ref >60)
Glucose, Bld: 102 mg/dL — ABNORMAL HIGH (ref 70–99)
Potassium: 4.2 mmol/L (ref 3.5–5.1)
Sodium: 138 mmol/L (ref 135–145)

## 2020-10-03 MED ORDER — OXYCODONE-ACETAMINOPHEN 5-325 MG PO TABS: 0.5000 | ORAL_TABLET | Freq: Four times a day (QID) | ORAL | 0 refills | Status: DC | PRN

## 2020-10-03 NOTE — Progress Notes (Signed)
Bilateral lower extremity venous duplex completed. Refer to "CV Proc" under chart review to view preliminary results.  10/03/2020 1:38 PM Kelby Aline., MHA, RVT, RDCS, RDMS

## 2020-10-03 NOTE — ED Triage Notes (Signed)
Pt reports onset yesterday of bilateral leg discomfort and swelling. Has pain with ambulating, states that its from his feet up to his knees, worse on left side. Reports chronic sob, no increase in symptoms recently.

## 2020-10-03 NOTE — ED Provider Notes (Signed)
[facility] EMERGENCY DEPARTMENT Provider Note   CSN: [id number] Arrival date & time: 10/03/20  1122     History Chief Complaint  Patient presents with   Leg Swelling    [name] is a 87 y.o. male.  HPI   Pt states he has been having trouble with his legs since 2019.  Pt states last night he started having trouble squeezing his toes or bending his foot.  When he got up he had a hard time walking.  He felt like his leg was numb.  Pt states his feet and calves started throbbing.  He felt like the muscles wanted to tense up.  It was in both legs, left more than right.  He was getting shooting pains coming up and down his leg mostly in the left foot.  Pt has seen Dr Jannifer Franklin as well as Dr Marlou Sa and Dr Nelva Bush.  He has been diagnosed with spine issues and has received injections.  Past Medical History:  Diagnosis Date   Acute superficial venous thrombosis of lower extremity    a. RLE after CABG, neg dopp for DVT.   Allergy    Anemia    Atrial fibrillation ([place])    a. Post-op from CABG, on amiodarone temporarily, d/c'd 12/2011   CAD (coronary artery disease)    a. S/P stenting to mid RCA, prox PDA 06/1999. b. NSTEMI/CABG x 3 in 10/2011 with LIMA to LAD, SVG to PDA, and SVG to OM1.    Chronic UTI    a. Followed by Dr. Risa Grill - colonized/asymptomatic - not on abx   CKD (chronic kidney disease), stage III ([place])    a. stable with a creatinine around 1.9-2.0 followed by nephrology.   Dyslipidemia    Echocardiogram    Echocardiogram 08/2018: EF 50-55, inf-lat AK, mild LVH, Gr 1 DD, RVSP 47, mild LAE, mild to mod MR, mild AI, mild AS (mean 11)   GERD (gastroesophageal reflux disease)    Hypertension    well-controlled.   Myocardial infarction ([place])    Neck injury    a. C3-C4 and C4-C5 foraminal narrowing, severe   Prostate cancer ([place])    a. 2001 s/p TURP.   Pulmonary nodule    a. felt to be noncancerous.  Status post followup CT scan 4 mm and stable.   Renal  artery stenosis (HCC)    a. 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    Patient Active Problem List   Diagnosis Date Noted   Weakness of both lower extremities 04/09/2020   Anxiety and depression 04/09/2020   Numbness of left foot 10/21/2019   S/P inguinal hernia repair 08/13/2019   Left inguinal hernia 07/01/2019   Gout involving toe of left foot 05/12/2018   Myoclonic jerking 02/10/2018   Chronic diastolic CHF (congestive heart failure) ([place]) 02/05/2018   UGIB (upper gastrointestinal bleed) 02/01/2018   Lumbar post-laminectomy syndrome 12/12/2017   Pain of left hip joint 11/29/2017   Trochanteric bursitis of left hip 11/29/2017   Hyperpigmentation 11/04/2017   Spinal stenosis of lumbar region 10/09/2017   Abnormal appearance of skin 10/07/2017   Frequent urination 09/03/2017   Gouty arthritis of right great toe 09/03/2017   Postnasal drip 08/06/2017   Rash and nonspecific skin eruption 08/06/2017   Moderate aortic regurgitation 78/29/5621   Diastolic dysfunction 30/86/5784   Edema 06/29/2017   SOB (shortness of breath) 06/29/2017   Sinus symptom  06/05/2017   Fatigue 05/18/2017   Sleeping difficulty 05/18/2017   Trochanteric bursitis 01/10/2017   Internal hemorrhoids 08/15/2016   Dysphagia 02/06/2016   Cephalalgia 07/14/2015   Constipation 03/01/2015   CHB (complete heart block) ([place]) 05/03/2012   Postoperative atrial fibrillation ([place]) 05/03/2012   Pacemaker 04/10/2012   AV block, 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 03/26/2012   Coronary atherosclerosis 11/27/2011   Presence of aortocoronary bypass graft 10/24/2011   Pulmonary nodule    CAD (coronary artery disease)    CKD (chronic kidney disease) stage 3, GFR 30-59 ml/min (HCC)    Venous insufficiency of leg 06/05/2010   SHOULDER PAIN, RIGHT, CHRONIC 10/14/2009   B12 deficiency 08/23/2009   Peripheral neuropathy 03/21/2009   DEGENERATIVE [place] DISEASE,  CERVICAL SPINE, W/RADICULOPATHY 09/21/2008   Dyslipidemia 01/17/2007   Essential hypertension 07/20/2006   Prostate cancer ([place]) 07/20/2006    Past Surgical History:  Procedure Laterality Date   ANTERIOR CHAMBER WASHOUT Left 10/04/2018   Procedure: Anterior Chamber Washout, Vitreous Tap;  Surgeon: Jalene Mullet, MD;  Location: [place];  Service: Ophthalmology;  Laterality: Left;   cardia catherization  07-07-99   CARDIAC SURGERY  10/18/12   open heart surgery   CATARACT EXTRACTION W/ INTRAOCULAR LENS  IMPLANT, BILATERAL  3/205, 06/2013   [facility]   COLONOSCOPY  04/12/07   CORONARY ARTERY BYPASS GRAFT  10/19/2011   Procedure: CORONARY ARTERY BYPASS GRAFTING (CABG);  Surgeon: Gaye Pollack, MD;  Location: [place];  Service: Open Heart Surgery;  Laterality: N/A;  times three using Left Internal Mammary Artery and Right Greater [place] Vein Graft harvested Endoscopically   edg  07-17-1994   FLEXIBLE SIGMOIDOSCOPY  11-03-1997   GAS INSERTION Left 10/04/2018   Procedure: Insertion Of Gas;  Surgeon: Jalene Mullet, MD;  Location: [place];  Service: Ophthalmology;  Laterality: Left;   GAS/FLUID EXCHANGE Left 10/04/2018   Procedure: Gas/Fluid Exchange;  Surgeon: Jalene Mullet, MD;  Location: [place];  Service: Ophthalmology;  Laterality: Left;   LEFT HEART CATHETERIZATION WITH CORONARY ANGIOGRAM N/A 10/16/2011   Procedure: LEFT HEART CATHETERIZATION WITH CORONARY ANGIOGRAM;  Surgeon: Peter M Martinique, MD;  Location: [facility] CATH LAB;  Service: Cardiovascular;  Laterality: N/A;   LEFT HEART CATHETERIZATION WITH CORONARY/GRAFT ANGIOGRAM N/A 03/11/2013   Procedure: LEFT HEART CATHETERIZATION WITH [name];  Surgeon: Blane Ohara, MD;  Location: [facility] CATH LAB;  Service: Cardiovascular;  Laterality: N/A;   lumbar spinal disk and neck fusion surgery     PACEMAKER INSERTION  03/28/12   PPM implanted for mobitz II AV block   PARS PLANA VITRECTOMY Left 10/04/2018   Procedure: PARS PLANA VITRECTOMY 25 GAUGE  FOR ENDOPHTHALMITIS WITH INJECTION OF INTRAVITREAL ANTIBIOTIC;  Surgeon: Jalene Mullet, MD;  Location: [place];  Service: Ophthalmology;  Laterality: Left;   peripheral vascular catherization  11-24-03   PERMANENT PACEMAKER INSERTION N/A 03/28/2012   Procedure: PERMANENT PACEMAKER INSERTION;  Surgeon: Thompson Grayer, MD;  Location: [facility] CATH LAB;  Service: Cardiovascular;  Laterality: N/A;   PROSTATECTOMY     renal circulation  10-01-03   s/p ptca     stents     X 2   stress cardiolite  05-04-05   spring 09-negative except for apical thinning, EF 68%       Family History  Problem Relation Age of Onset   Coronary artery disease Father        died @ 6   Other Mother        cerebral hemorrhage - died @  50   Arthritis Mother    Cancer Brother        Bladder   Prostate cancer Brother    Nephritis Brother        died @ age 10.   Other Brother        cerebral hemorrhage - died @ 49   Heart disease Brother    Breast cancer Other        niece x 2   Diabetes Neg Hx    Colon cancer Neg Hx    Adrenal disorder Neg Hx     Social History   Tobacco Use   Smoking status: Never   Smokeless tobacco: Never  Vaping Use   Vaping Use: Never used  Substance Use Topics   Alcohol use: Yes    Comment: Rarely   Drug use: No    Home Medications Prior to Admission medications   Medication Sig Start Date End Date Taking? Authorizing Provider  oxyCODONE-acetaminophen (PERCOCET/ROXICET) 5-325 MG tablet Take 0.5-1 tablets by mouth every 6 (six) hours as needed for severe pain. 10/03/20  Yes Dorie Rank, MD  acetaminophen (TYLENOL) 500 MG tablet Take 500 mg by mouth every 6 (six) hours as needed.    [provider]  allopurinol (ZYLOPRIM) 100 MG tablet TAKE 1 TABLET(100 MG) BY MOUTH DAILY 07/04/20   [name], MD  amLODipine (NORVASC) 5 MG tablet Take 1 tablet (5 mg total) by mouth daily. 05/05/20   [name], MD  Aromatic Inhalants (VICKS VAPOR INHALER IN) Place 1 puff into both  nostrils as needed (for congestion).    [provider]  aspirin EC 81 MG tablet Take 81 mg by mouth daily.    [provider]  Calcium Citrate-Vitamin D (CITRACAL + D PO) Take 2 tablets by mouth in the morning and at bedtime.    [provider]  Carboxymeth-Glycerin-Polysorb (REFRESH OPTIVE MEGA-3 OP) Place 1 drop into both eyes 2 (two) times daily.    [provider]  clobetasol cream (TEMOVATE) 0.05 % Apply topically 2 (two) times daily. 08/04/20   [provider]  epoetin alfa-epbx (RETACRIT) 2000 UNIT/ML injection 2,000 Units every 14 (fourteen) days. Patient states he only gets it when he needs it but not every 2 weeks    [provider]  folic acid (FOLVITE) 1 MG tablet Take 1 tablet (1 mg total) by mouth daily. Annual appt due in May must see provider for future refills 02/02/19   [name], MD  furosemide (LASIX) 40 MG tablet Take 1 tablet (40 mg total) by mouth daily as needed for edema. 08/08/20 09/07/20  [name], MD  loratadine (CLARITIN) 10 MG tablet Take 10 mg by mouth daily as needed (for seasonal allergies).    [provider]  nitroGLYCERIN (NITROSTAT) 0.4 MG SL tablet DISSOLVE 1 TABLET UNDER THE TONGUE EVERY 5 MINUTES AS NEEDED FOR CHEST PAIN, MAXIMUM 3 TABLETS 04/30/19   Burns, Claudina Lick, MD  Saline (AYR NASAL MIST ALLERGY/SINUS NA) Place into the nose.    [provider]  vitamin C (ASCORBIC ACID) 500 MG tablet Take 1,000 mg by mouth daily.     [provider]  Vitamin D, Ergocalciferol, (DRISDOL) 1.25 MG (50000 UT) CAPS capsule Take 50,000 Units by mouth every 30 (thirty) days.     [provider]    Allergies    Aspirin, Lisinopril, Amoxicillin, Atarax [hydroxyzine hcl], Cephalexin, Ciprofloxacin, Clindamycin, Clobetasol, Codeine, Fish allergy, Fluarix [influenza virus vaccine], Haemophilus influenzae,  Hydrocodone, Hydrocodone-acetaminophen, Hydroxyzine, Latex, Macrobid [nitrofurantoin  macrocrystal], Niacin, Niacin-lovastatin er, Niacin-lovastatin er, Nitrofurantoin, Omeprazole, Other, Vibramycin [doxycycline], Adhesive [tape], Bactrim [sulfamethoxazole-trimethoprim], Colchicine, Gabapentin, and Nortriptyline  Review of Systems   Review of Systems  All other systems reviewed and are negative.  Physical Exam Updated Vital Signs BP (!) 162/98   Pulse 85   Temp 98.4 F (36.9 C) (Oral)   Resp 20   Ht 1.778 m (5\' 10" )   Wt 69.9 kg   SpO2 100%   BMI 22.10 kg/m   Physical Exam Vitals and nursing note reviewed.  Constitutional:      General: He is not in acute distress.    Appearance: He is well-developed.  HENT:     Head: Normocephalic and atraumatic.     Right Ear: External ear normal.     Left Ear: External ear normal.  Eyes:     General: No scleral icterus.       Right eye: No discharge.        Left eye: No discharge.     Conjunctiva/sclera: Conjunctivae normal.  Neck:     Trachea: No tracheal deviation.  Cardiovascular:     Rate and Rhythm: Normal rate and regular rhythm.     Comments: normal dorsalis pedal pulses bilateral lower extremities Pulmonary:     Effort: Pulmonary effort is normal. No respiratory distress.     Breath sounds: Normal breath sounds. No stridor. No wheezing or rales.  Abdominal:     General: Bowel sounds are normal. There is no distension.     Palpations: Abdomen is soft.     Tenderness: There is no abdominal tenderness. There is no guarding or rebound.  Musculoskeletal:        General: No tenderness or deformity.     Cervical back: Neck supple.     Right lower leg: No edema.     Left lower leg: No edema.     Comments: Mild discomfort with left leg raise, no erythema or edema bilateral lower extremities, no tenderness to the lower extremities, no spinal tenderness  Skin:    General: Skin is warm and dry.     Findings: No rash.  Neurological:     General: No focal deficit present.     Mental Status: He is alert.      Cranial Nerves: No cranial nerve deficit (no facial droop, extraocular movements intact, no slurred speech).     Sensory: No sensory deficit.     Motor: No abnormal muscle tone or seizure activity.     Coordination: Coordination normal.     Comments: Mild discomfort with straight leg raise, normal strength and sensation bilateral lower extremities, no  Psychiatric:        Mood and Affect: Mood normal.    ED Results / Procedures / Treatments   Labs (all labs ordered are listed, but only abnormal results are displayed) Labs Reviewed  BASIC METABOLIC PANEL - Abnormal; Notable for the following components:      Result Value   Glucose, Bld 102 (*)    BUN 44 (*)    Creatinine, Ser 2.00 (*)    GFR, Estimated 31 (*)    All other components within normal limits  CBC WITH DIFFERENTIAL/PLATELET - Abnormal; Notable for the following components:   Hemoglobin 12.2 (*)    MCH 25.7 (*)    RDW 18.2 (*)    All other components within normal limits    EKG None  Radiology VAS [place] LOWER EXTREMITY VENOUS (  DVT) (ONLY [facility] & [facility] 7a-7p)  Result Date: 10/03/2020  Lower Venous DVT Study Patient Name:  [name]  Date of Exam:   10/03/2020 Medical Rec #: [id number]           Accession #:    [id number] Date of Birth: [birth year]           Patient Gender: M Patient Age:   76 years Exam Location:  [place] Procedure:      VAS [place] LOWER EXTREMITY VENOUS (DVT) Referring Phys: Marijean Bravo COUTURE --------------------------------------------------------------------------------  Indications: Bilateral lower extremity pain x5 years, new onset swelling and numbness.  Comparison Study: No prior study Performing Technologist: Maudry Mayhew MHA, RDMS, RVT, RDCS  Examination Guidelines: A complete evaluation includes B-mode imaging, spectral Doppler, color Doppler, and power Doppler as needed of all accessible portions of each vessel. Bilateral testing is considered an integral part of a complete examination.  Limited examinations for reoccurring indications may be performed as noted. The reflux portion of the exam is performed with the patient in reverse Trendelenburg.  +---------+---------------+---------+-----------+----------+--------------+ RIGHT    CompressibilityPhasicitySpontaneityPropertiesThrombus Aging +---------+---------------+---------+-----------+----------+--------------+ CFV      Full           Yes      Yes                                 +---------+---------------+---------+-----------+----------+--------------+ SFJ      Full                                                        +---------+---------------+---------+-----------+----------+--------------+ FV Prox  Full                                                        +---------+---------------+---------+-----------+----------+--------------+ FV Mid   Full                                                        +---------+---------------+---------+-----------+----------+--------------+ FV DistalFull                                                        +---------+---------------+---------+-----------+----------+--------------+ PFV      Full                                                        +---------+---------------+---------+-----------+----------+--------------+ POP      Full           Yes      Yes                                 +---------+---------------+---------+-----------+----------+--------------+  PTV      Full                                                        +---------+---------------+---------+-----------+----------+--------------+ PERO     Full                                                        +---------+---------------+---------+-----------+----------+--------------+   +---------+---------------+---------+-----------+----------+--------------+ LEFT     CompressibilityPhasicitySpontaneityPropertiesThrombus Aging  +---------+---------------+---------+-----------+----------+--------------+ CFV      Full           Yes      Yes                                 +---------+---------------+---------+-----------+----------+--------------+ SFJ      Full                                                        +---------+---------------+---------+-----------+----------+--------------+ FV Prox  Full                                                        +---------+---------------+---------+-----------+----------+--------------+ FV Mid   Full                                                        +---------+---------------+---------+-----------+----------+--------------+ FV DistalFull                                                        +---------+---------------+---------+-----------+----------+--------------+ PFV      Full                                                        +---------+---------------+---------+-----------+----------+--------------+ POP      Full           Yes      Yes                                 +---------+---------------+---------+-----------+----------+--------------+ PTV      Full                                                        +---------+---------------+---------+-----------+----------+--------------+  PERO     Full                                                        +---------+---------------+---------+-----------+----------+--------------+     Summary: RIGHT: - There is no evidence of deep vein thrombosis in the lower extremity.  - No cystic structure found in the popliteal fossa.  LEFT: - There is no evidence of deep vein thrombosis in the lower extremity.  - No cystic structure found in the popliteal fossa.  *See table(s) above for measurements and observations. Electronically signed by Orlie Pollen on 10/03/2020 at 5:41:40 PM.    Final     Procedures Procedures   Medications Ordered in ED Medications - No data to display  ED  Course  I have reviewed the triage vital signs and the nursing notes.  Pertinent labs & imaging results that were available during my care of the patient were reviewed by me and considered in my medical decision making (see chart for details).    MDM Rules/Calculators/A&P                           Patient CBC is normal.  No leukocytosis to suggest infection.  Patient has chronic kidney disease and his BUN and creatinine are stable.  Patient does not have any evidence of the DVT Doppler study.  On exam patient has good pulses and warm extremities.  He does not have any acute neurologic deficit.  Patient does have a history of left lower extremity weakness and pain according to the medical records.  Suspect he has a component of sciatica versus spinal stenosis causing the slight discomfort.  Will give short course of pain medications.  Discussed the dangers of opiate use but he can use this for more severe pain when he is trying to sleep.  Patient does follow-up with Dr. Nelva Bush and plans on seeing him later this week. Final Clinical Impression(s) / ED Diagnoses Final diagnoses:  Neuropathy    Rx / DC Orders ED Discharge Orders          Ordered    oxyCODONE-acetaminophen (PERCOCET/ROXICET) 5-325 MG tablet  Every 6 hours PRN        10/03/20 2131             Dorie Rank, MD 10/03/20 2135

## 2020-10-03 NOTE — Discharge Instructions (Addendum)
You can take 1/2 to 1 tablet every 6 hours as needed for severe pain.  You can take Tylenol as recommended by your doctor otherwise for pain.  Follow-up with Dr. Nelva Bush for further evaluation and treatment.

## 2020-10-03 NOTE — Telephone Encounter (Signed)
Patient is having numbness in both legs from knee cap down. Complaining of shooting pain from his knees to his feet. Started last night.   Transferred to triage.

## 2020-10-03 NOTE — ED Provider Notes (Signed)
Emergency Medicine Provider Triage Evaluation Note  [name] , a 87 y.o. male  was evaluated in triage.  Pt complains of bilat leg swelling that started last night. He further c/o bilat leg pain for over 5 years.  Review of Systems  Positive: Ble swelling, leg pain Negative: Chest pain  Physical Exam  There were no vitals taken for this visit. Gen:   Awake, no distress   Resp:  Normal effort  MSK:   Moves extremities without difficulty  Other:  Dp pulses intact and symmetric   Medical Decision Making  Medically screening exam initiated at 12:17 PM.  Appropriate orders placed.  [name] was informed that the remainder of the evaluation will be completed by another provider, this initial triage assessment does not replace that evaluation, and the importance of remaining in the ED until their evaluation is complete.     Mike Booze, PA-C 10/03/20 1221    Mike Starch, MD 10/04/20 1410

## 2020-10-03 NOTE — ED Notes (Signed)
Patient verbalizes understanding of discharge instructions. Prescriptions and follow-up care reviewed. Opportunity for questioning and answers were provided. Armband removed by staff, pt discharged from ED ambulatory.  

## 2020-10-03 NOTE — Telephone Encounter (Signed)
[facility] FYI...   Caller states, pt having numbness knees down and shooting pain to foots. Calling has swelling in legs as well. Caller states left leg is worse than right leg.  Advised to go to ED now. Patient understood and went to [facility] ED

## 2020-10-04 ENCOUNTER — Ambulatory Visit: Payer: Medicare Other | Admitting: Cardiovascular Disease

## 2020-10-04 ENCOUNTER — Encounter (HOSPITAL_COMMUNITY)
Admission: RE | Admit: 2020-10-04 | Discharge: 2020-10-04 | Disposition: A | Discharge status: Home / Self Care | Payer: Medicare Other | Source: Ambulatory Visit | Attending: Nephrology | Admitting: Nephrology

## 2020-10-04 VITALS — BP 125/69 | HR 82 | Temp 98.2°F | Resp 20

## 2020-10-04 DIAGNOSIS — N183 Chronic kidney disease, stage 3 unspecified: Secondary | ICD-10-CM

## 2020-10-04 LAB — POCT HEMOGLOBIN-HEMACUE: Hemoglobin: 12.6 g/dL — ABNORMAL LOW (ref 13.0–17.0)

## 2020-10-04 MED ORDER — EPOETIN ALFA-EPBX 10000 UNIT/ML IJ SOLN: 20000.0000 [IU] | INTRAMUSCULAR | Status: DC

## 2020-10-06 ENCOUNTER — Other Ambulatory Visit: Payer: Self-pay | Admitting: Physical Medicine and Rehabilitation

## 2020-10-06 DIAGNOSIS — M545 Low back pain, unspecified: Secondary | ICD-10-CM

## 2020-10-11 ENCOUNTER — Ambulatory Visit (INDEPENDENT_AMBULATORY_CARE_PROVIDER_SITE_OTHER): Payer: Medicare Other

## 2020-10-11 DIAGNOSIS — I442 Atrioventricular block, complete: Secondary | ICD-10-CM | POA: Diagnosis not present

## 2020-10-12 ENCOUNTER — Ambulatory Visit
Admission: RE | Admit: 2020-10-12 | Discharge: 2020-10-12 | Disposition: A | Discharge status: Home / Self Care | Payer: Medicare Other | Source: Ambulatory Visit | Attending: Physical Medicine and Rehabilitation | Admitting: Physical Medicine and Rehabilitation

## 2020-10-12 DIAGNOSIS — M545 Low back pain, unspecified: Secondary | ICD-10-CM

## 2020-10-12 LAB — CUP PACEART REMOTE DEVICE CHECK
Battery Impedance: 910 Ohm
Battery Remaining Longevity: 71 mo
Battery Voltage: 2.78 V
Brady Statistic AP VP Percent: 0 %
Brady Statistic AP VS Percent: 18 %
Brady Statistic AS VP Percent: 1 %
Brady Statistic AS VS Percent: 82 %
Date Time Interrogation Session: 20221004102939
Implantable Lead Implant Date: 20140321
Implantable Lead Implant Date: 20140321
Implantable Lead Location: 753859
Implantable Lead Location: 753860
Implantable Lead Model: 5076
Implantable Lead Model: 5092
Implantable Pulse Generator Implant Date: 20140321
Lead Channel Impedance Value: 411 Ohm
Lead Channel Impedance Value: 723 Ohm
Lead Channel Pacing Threshold Amplitude: 0.625 V
Lead Channel Pacing Threshold Amplitude: 0.875 V
Lead Channel Pacing Threshold Pulse Width: 0.4 ms
Lead Channel Pacing Threshold Pulse Width: 0.4 ms
Lead Channel Setting Pacing Amplitude: 2 V
Lead Channel Setting Pacing Amplitude: 2.5 V
Lead Channel Setting Pacing Pulse Width: 0.4 ms
Lead Channel Setting Sensing Sensitivity: 5.6 mV

## 2020-10-18 ENCOUNTER — Other Ambulatory Visit: Payer: Self-pay

## 2020-10-18 ENCOUNTER — Encounter (HOSPITAL_COMMUNITY)
Admission: RE | Admit: 2020-10-18 | Discharge: 2020-10-18 | Disposition: A | Discharge status: Home / Self Care | Payer: Medicare Other | Source: Ambulatory Visit | Attending: Nephrology | Admitting: Nephrology

## 2020-10-18 VITALS — BP 147/76 | HR 74 | Resp 19

## 2020-10-18 DIAGNOSIS — D631 Anemia in chronic kidney disease: Secondary | ICD-10-CM | POA: Insufficient documentation

## 2020-10-18 DIAGNOSIS — N183 Chronic kidney disease, stage 3 unspecified: Secondary | ICD-10-CM | POA: Diagnosis present

## 2020-10-18 LAB — IRON AND TIBC
Iron: 83 ug/dL (ref 45–182)
Saturation Ratios: 29 % (ref 17.9–39.5)
TIBC: 284 ug/dL (ref 250–450)
UIBC: 201 ug/dL

## 2020-10-18 LAB — FERRITIN: Ferritin: 213 ng/mL (ref 24–336)

## 2020-10-18 LAB — POCT HEMOGLOBIN-HEMACUE: Hemoglobin: 11.8 g/dL — ABNORMAL LOW (ref 13.0–17.0)

## 2020-10-18 MED ORDER — EPOETIN ALFA-EPBX 10000 UNIT/ML IJ SOLN
INTRAMUSCULAR | Status: AC
  Filled 2020-10-18: qty 2

## 2020-10-18 MED ORDER — EPOETIN ALFA-EPBX 10000 UNIT/ML IJ SOLN
20000.0000 [IU] | INTRAMUSCULAR | Status: DC
  Administered 2020-10-18: 20000 [IU] via SUBCUTANEOUS

## 2020-10-19 NOTE — Progress Notes (Signed)
Remote pacemaker transmission.   

## 2020-10-21 ENCOUNTER — Ambulatory Visit (INDEPENDENT_AMBULATORY_CARE_PROVIDER_SITE_OTHER): Payer: Medicare Other

## 2020-10-21 DIAGNOSIS — E538 Deficiency of other specified B group vitamins: Secondary | ICD-10-CM

## 2020-10-21 MED ORDER — CYANOCOBALAMIN 1000 MCG/ML IJ SOLN
1000.0000 ug | Freq: Once | INTRAMUSCULAR | Status: AC
  Administered 2020-10-21: 1000 ug via INTRAMUSCULAR

## 2020-10-21 NOTE — Progress Notes (Signed)
Pt given B12 w/o any complications. °

## 2020-10-25 DIAGNOSIS — M722 Plantar fascial fibromatosis: Secondary | ICD-10-CM | POA: Insufficient documentation

## 2020-10-25 HISTORY — DX: Plantar fascial fibromatosis: M72.2

## 2020-10-29 IMAGING — DX DG FOOT COMPLETE 3+V*R*
3 series · 3 of 3 positions shown · non-contrast
Comparison: None.

CLINICAL DATA: Pain, swelling of right foot

EXAM:
RIGHT FOOT COMPLETE - 3+ VIEW

[foot ap]
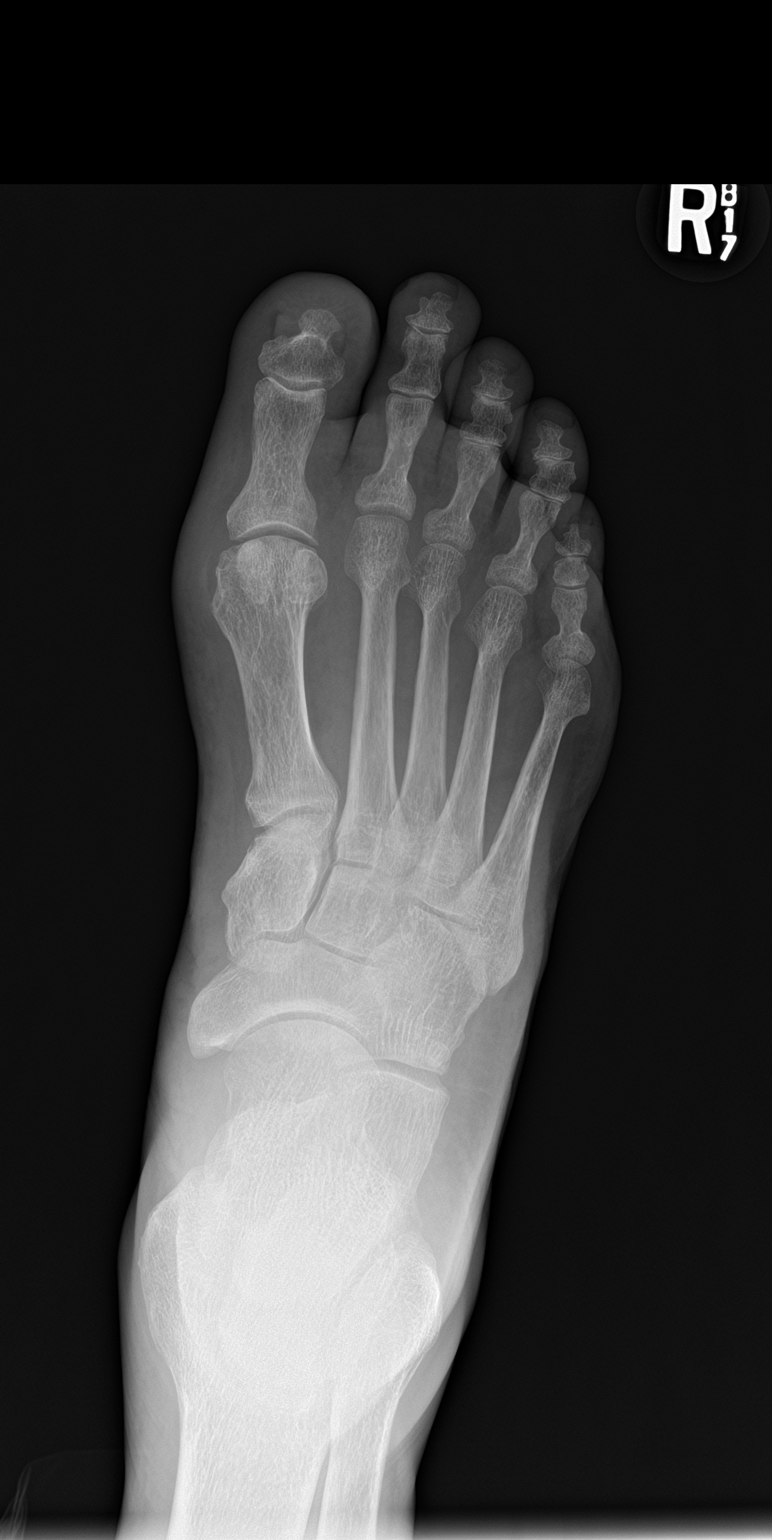

[foot obl]
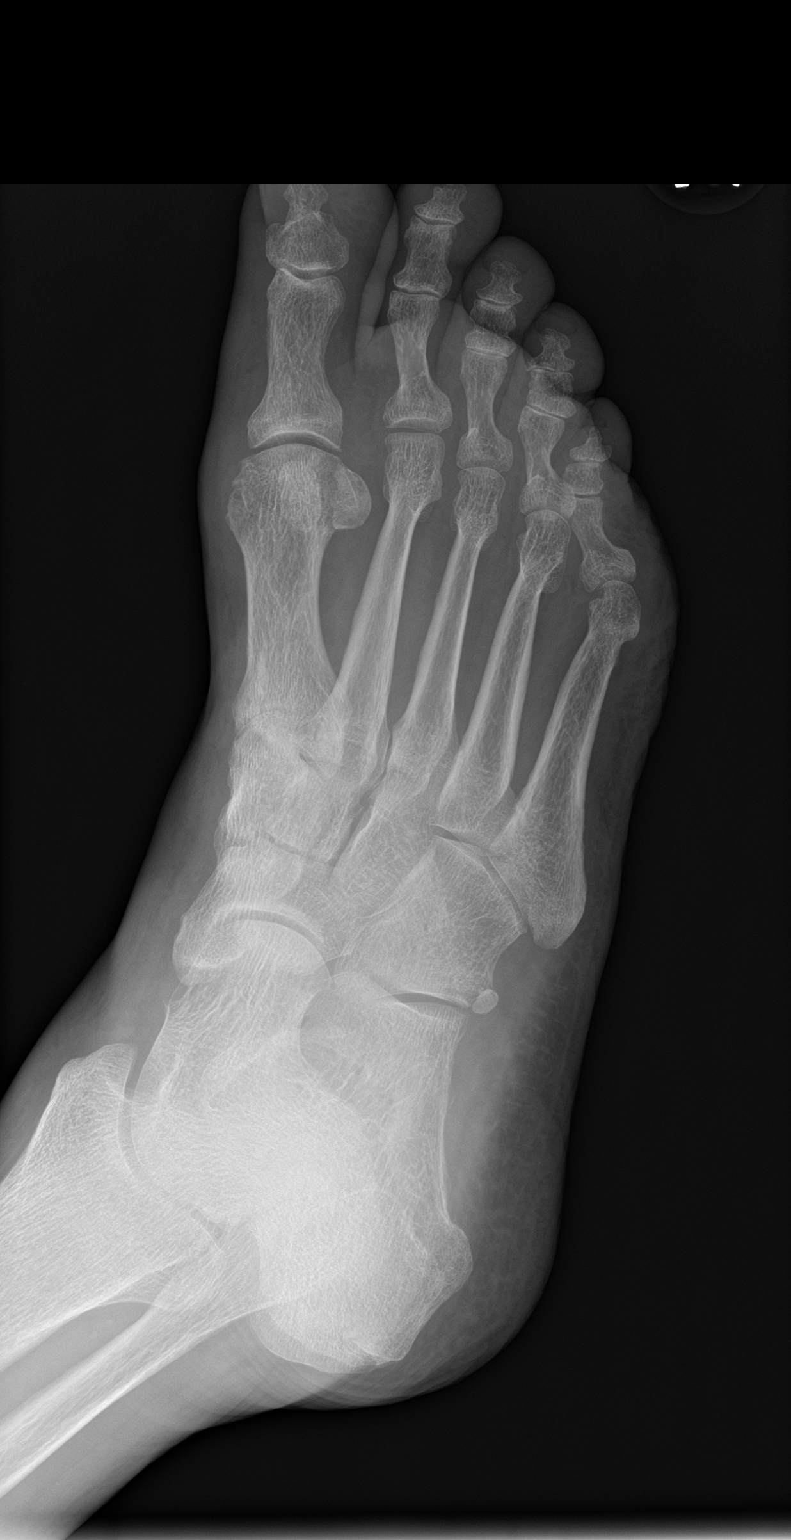

[foot lat]
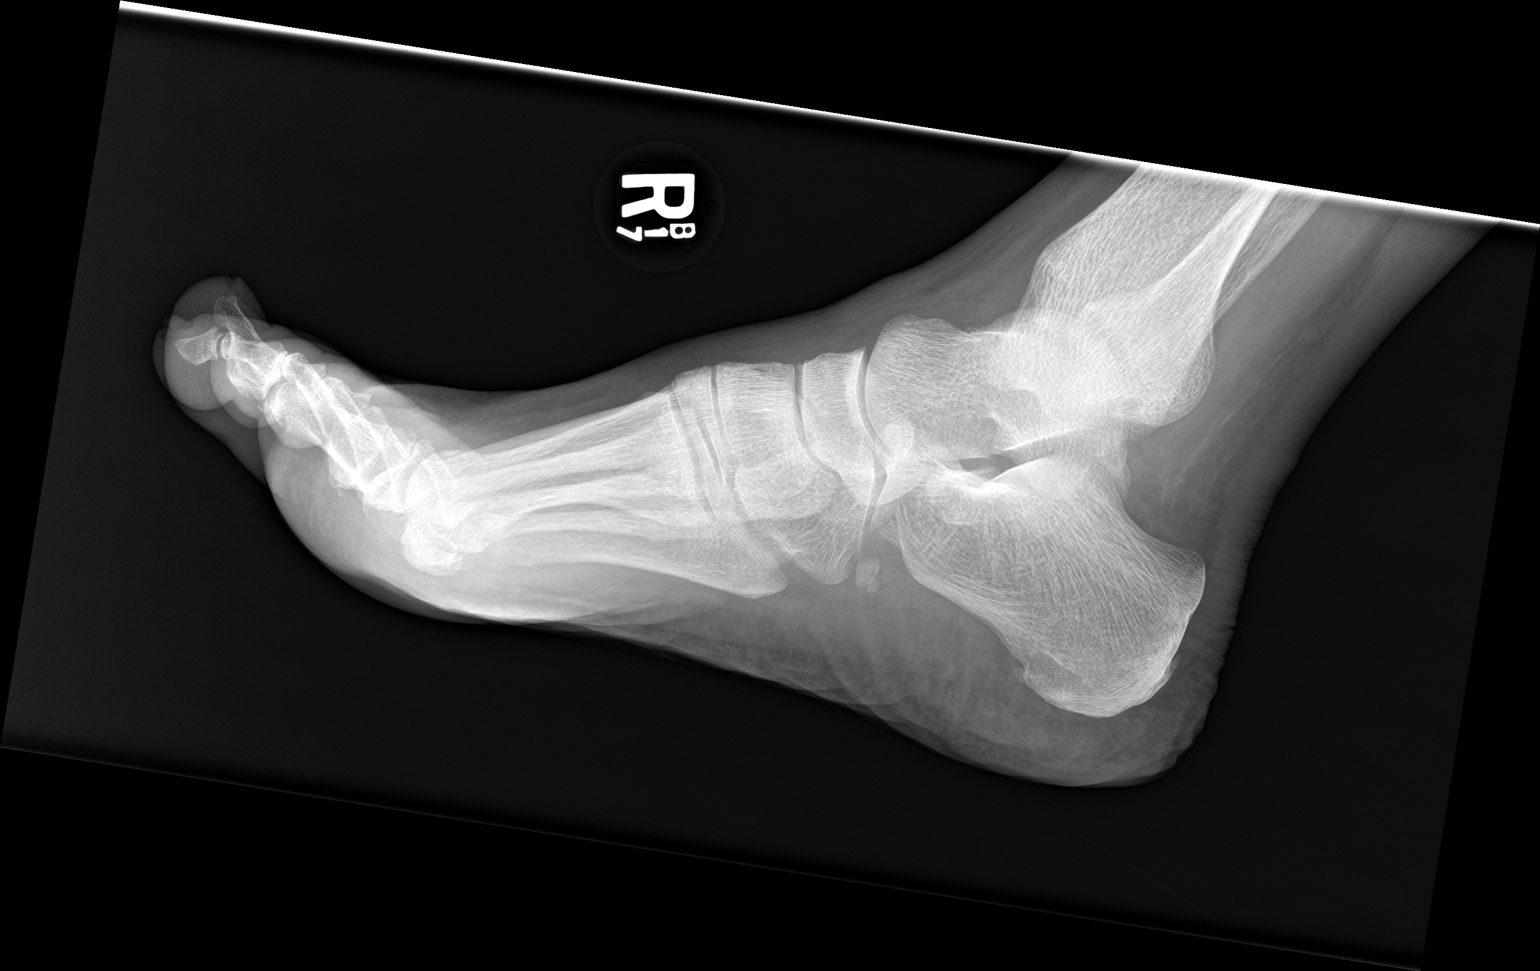

[3 of 3 positions shown; findings below may reference images not displayed]

FINDINGS: No acute bony abnormality. Specifically, no fracture, subluxation,
or dislocation.
IMPRESSION: No acute bony abnormality.

## 2020-11-01 ENCOUNTER — Encounter (HOSPITAL_COMMUNITY)
Admission: RE | Admit: 2020-11-01 | Discharge: 2020-11-01 | Disposition: A | Discharge status: Home / Self Care | Payer: Medicare Other | Source: Ambulatory Visit | Attending: Nephrology | Admitting: Nephrology

## 2020-11-01 ENCOUNTER — Other Ambulatory Visit: Payer: Self-pay

## 2020-11-01 VITALS — BP 149/73 | HR 83 | Temp 97.3°F | Resp 18

## 2020-11-01 DIAGNOSIS — N183 Chronic kidney disease, stage 3 unspecified: Secondary | ICD-10-CM | POA: Diagnosis not present

## 2020-11-01 LAB — POCT HEMOGLOBIN-HEMACUE: Hemoglobin: 12.8 g/dL — ABNORMAL LOW (ref 13.0–17.0)

## 2020-11-01 MED ORDER — EPOETIN ALFA-EPBX 10000 UNIT/ML IJ SOLN: 20000.0000 [IU] | INTRAMUSCULAR | Status: DC

## 2020-11-03 ENCOUNTER — Encounter (HOSPITAL_COMMUNITY): Payer: Self-pay

## 2020-11-08 ENCOUNTER — Ambulatory Visit (INDEPENDENT_AMBULATORY_CARE_PROVIDER_SITE_OTHER): Payer: Medicare Other | Admitting: Internal Medicine

## 2020-11-08 ENCOUNTER — Encounter: Payer: Self-pay | Admitting: Internal Medicine

## 2020-11-08 ENCOUNTER — Other Ambulatory Visit: Payer: Self-pay

## 2020-11-08 VITALS — BP 132/78 | HR 88 | Temp 98.7°F | Ht 70.0 in | Wt 158.0 lb

## 2020-11-08 DIAGNOSIS — I10 Essential (primary) hypertension: Secondary | ICD-10-CM

## 2020-11-08 DIAGNOSIS — G6289 Other specified polyneuropathies: Secondary | ICD-10-CM | POA: Diagnosis not present

## 2020-11-08 DIAGNOSIS — R413 Other amnesia: Secondary | ICD-10-CM

## 2020-11-08 DIAGNOSIS — R29898 Other symptoms and signs involving the musculoskeletal system: Secondary | ICD-10-CM

## 2020-11-08 DIAGNOSIS — M1A30X0 Chronic gout due to renal impairment, unspecified site, without tophus (tophi): Secondary | ICD-10-CM

## 2020-11-08 DIAGNOSIS — R609 Edema, unspecified: Secondary | ICD-10-CM

## 2020-11-08 DIAGNOSIS — I2510 Atherosclerotic heart disease of native coronary artery without angina pectoris: Secondary | ICD-10-CM | POA: Diagnosis not present

## 2020-11-08 DIAGNOSIS — N1832 Chronic kidney disease, stage 3b: Secondary | ICD-10-CM

## 2020-11-08 NOTE — Assessment & Plan Note (Signed)
Chronic Related to spinal stenosis, poly neuropathy Doing PT at [facility]

## 2020-11-08 NOTE — Assessment & Plan Note (Signed)
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

## 2020-11-08 NOTE — Assessment & Plan Note (Signed)
Chronic Stable Following with Dr Justin Mend

## 2020-11-08 NOTE — Assessment & Plan Note (Signed)
Chronic Controlled, stable Continue lasix 40 mg daily prn

## 2020-11-08 NOTE — Assessment & Plan Note (Signed)
Following with cardiology  - has upcoming appt No chest pain or palps Continue current medications

## 2020-11-08 NOTE — Assessment & Plan Note (Signed)
Chronic No recent flares Continue allopurinol 100 mg daily

## 2020-11-08 NOTE — Progress Notes (Signed)
Subjective:    Patient ID: [name], male    DOB: Apr 28, 1926, 87 y.o.   MRN: [id number]  This visit occurred during the SARS-CoV-2 public health emergency.  Safety protocols were in place, including screening questions prior to the visit, additional usage of staff PPE, and extensive cleaning of exam room while observing appropriate contact time as indicated for disinfecting solutions.     HPI The patient is here for follow up of their chronic medical problems, including polyneuropathy, leg edema - lasix prn, htn, CAD, gout, CKD, prostate ca, B12 def  Doing PT at [facility] - for deconditioning.  He is trying to do it twice a day - unable to do it 3/day.    Medications and allergies reviewed with patient and updated if appropriate.  Patient Active Problem List   Diagnosis Date Noted   Weakness of both lower extremities 04/09/2020   Anxiety and depression 04/09/2020   Numbness of left foot 10/21/2019   S/P inguinal hernia repair 08/13/2019   Left inguinal hernia 07/01/2019   Gout 05/12/2018   Myoclonic jerking 02/10/2018   Chronic diastolic CHF (congestive heart failure) ([place]) 02/05/2018   UGIB (upper gastrointestinal bleed) 02/01/2018   Lumbar post-laminectomy syndrome 12/12/2017   Pain of left hip joint 11/29/2017   Trochanteric bursitis of left hip 11/29/2017   Hyperpigmentation 11/04/2017   Spinal stenosis of lumbar region 10/09/2017   Frequent urination 09/03/2017   Gouty arthritis of right great toe 09/03/2017   Postnasal drip 08/06/2017   Rash and nonspecific skin eruption 08/06/2017   Moderate aortic regurgitation 28/78/6767   Diastolic dysfunction 20/94/7096   Edema 06/29/2017   SOB (shortness of breath) 06/29/2017   Sinus symptom 06/05/2017   Fatigue 05/18/2017   Sleeping difficulty 05/18/2017   Trochanteric bursitis 01/10/2017   Internal hemorrhoids 08/15/2016   Dysphagia 02/06/2016   Cephalalgia 07/14/2015   Constipation 03/01/2015   CHB  (complete heart block) ([place]) 05/03/2012   Postoperative atrial fibrillation ([place]) 05/03/2012   Pacemaker 04/10/2012   AV block, 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 03/26/2012   Coronary atherosclerosis 11/27/2011   Presence of aortocoronary bypass graft 10/24/2011   Pulmonary nodule    CAD (coronary artery disease)    CKD (chronic kidney disease) stage 3, GFR 30-59 ml/min (HCC)    Venous insufficiency of leg 06/05/2010   SHOULDER PAIN, RIGHT, CHRONIC 10/14/2009   B12 deficiency 08/23/2009   Peripheral neuropathy 03/21/2009   DEGENERATIVE DISC DISEASE, CERVICAL SPINE, W/RADICULOPATHY 09/21/2008   Dyslipidemia 01/17/2007   Essential hypertension 07/20/2006   Prostate cancer ([place]) 07/20/2006    Current Outpatient Medications on File Prior to Visit  Medication Sig Dispense Refill   acetaminophen (TYLENOL) 500 MG tablet Take 500 mg by mouth every 6 (six) hours as needed.     allopurinol (ZYLOPRIM) 100 MG tablet TAKE 1 TABLET(100 MG) BY MOUTH DAILY 90 tablet 1   amLODipine (NORVASC) 5 MG tablet Take 1 tablet (5 mg total) by mouth daily. 90 tablet 2   Aromatic Inhalants (VICKS VAPOR INHALER IN) Place 1 puff into both nostrils as needed (for congestion).     aspirin EC 81 MG tablet Take 81 mg by mouth daily.     Calcium Citrate-Vitamin D (CITRACAL + D PO) Take 2 tablets by mouth in the morning and at bedtime.     Carboxymeth-Glycerin-Polysorb (REFRESH OPTIVE MEGA-3 OP) Place 1 drop into both eyes 2 (two) times daily.     clobetasol cream (TEMOVATE) 0.05 %  Apply topically 2 (two) times daily.     Cyanocobalamin (VITAMIN B12) 1000 MCG TBCR 1 tablet     epoetin alfa-epbx (RETACRIT) 2000 UNIT/ML injection 2,000 Units every 14 (fourteen) days. Patient states he only gets it when he needs it but not every 2 weeks     folic acid (FOLVITE) 1 MG tablet Take 1 tablet (1 mg total) by mouth daily. Annual appt due in May must see provider for future refills 90 tablet 0   loratadine (CLARITIN) 10 MG  tablet Take 10 mg by mouth daily as needed (for seasonal allergies).     Multiple Vitamins-Minerals (PRESERVISION AREDS 2 PO) Take by mouth.     nitroGLYCERIN (NITROSTAT) 0.4 MG SL tablet DISSOLVE 1 TABLET UNDER THE TONGUE EVERY 5 MINUTES AS NEEDED FOR CHEST PAIN, MAXIMUM 3 TABLETS 100 tablet 1   Saline (AYR NASAL MIST ALLERGY/SINUS NA) Place into the nose.     vitamin C (ASCORBIC ACID) 500 MG tablet Take 1,000 mg by mouth daily.      Vitamin D, Ergocalciferol, (DRISDOL) 1.25 MG (50000 UT) CAPS capsule Take 50,000 Units by mouth every 30 (thirty) days.      furosemide (LASIX) 40 MG tablet Take 1 tablet (40 mg total) by mouth daily as needed for edema. 30 tablet 0   oxyCODONE-acetaminophen (PERCOCET/ROXICET) 5-325 MG tablet Take 0.5-1 tablets by mouth every 6 (six) hours as needed for severe pain. (Patient not taking: Reported on 11/08/2020) 10 tablet 0   Current Facility-Administered Medications on File Prior to Visit  Medication Dose Route Frequency Provider Last Rate Last Admin   NON FORMULARY 1 application  1 application Topical PRN Landis Martins, DPM        Past Medical History:  Diagnosis Date   Acute superficial venous thrombosis of lower extremity    a. RLE after CABG, neg dopp for DVT.   Allergy    Anemia    Atrial fibrillation ([place])    a. Post-op from CABG, on amiodarone temporarily, d/c'd 12/2011   CAD (coronary artery disease)    a. S/P stenting to mid RCA, prox PDA 06/1999. b. NSTEMI/CABG x 3 in 10/2011 with LIMA to LAD, SVG to PDA, and SVG to OM1.    Chronic UTI    a. Followed by Dr. Risa Grill - colonized/asymptomatic - not on abx   CKD (chronic kidney disease), stage III ([place])    a. stable with a creatinine around 1.9-2.0 followed by nephrology.   Dyslipidemia    Echocardiogram    Echocardiogram 08/2018: EF 50-55, inf-lat AK, mild LVH, Gr 1 DD, RVSP 47, mild LAE, mild to mod MR, mild AI, mild AS (mean 11)   GERD (gastroesophageal reflux disease)    Hypertension     well-controlled.   Myocardial infarction ([place])    Neck injury    a. C3-C4 and C4-C5 foraminal narrowing, severe   Prostate cancer ([place])    a. 2001 s/p TURP.   Pulmonary nodule    a. felt to be noncancerous.  Status post followup CT scan 4 mm and stable.   Renal artery stenosis (HCC)    a. 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    Past Surgical History:  Procedure Laterality Date   ANTERIOR CHAMBER WASHOUT Left 10/04/2018   Procedure: Anterior Chamber Washout, Vitreous Tap;  Surgeon: Jalene Mullet, MD;  Location: [facility];  Service: Ophthalmology;  Laterality: Left;   cardia catherization  07-07-99  CARDIAC SURGERY  10/18/12   open heart surgery   CATARACT EXTRACTION W/ INTRAOCULAR LENS  IMPLANT, BILATERAL  3/205, 06/2013   [facility]   COLONOSCOPY  04/12/07   CORONARY ARTERY BYPASS GRAFT  10/19/2011   Procedure: CORONARY ARTERY BYPASS GRAFTING (CABG);  Surgeon: Gaye Pollack, MD;  Location: [facility];  Service: Open Heart Surgery;  Laterality: N/A;  times three using Left Internal Mammary Artery and Right Greater S[place]hen[place] Vein Graft harvested Endoscopically   edg  07-17-1994   FLEXIBLE SIGMOIDOSCOPY  11-03-1997   GAS INSERTION Left 10/04/2018   Procedure: Insertion Of Gas;  Surgeon: Jalene Mullet, MD;  Location: [facility];  Service: Ophthalmology;  Laterality: Left;   GAS/FLUID EXCHANGE Left 10/04/2018   Procedure: Gas/Fluid Exchange;  Surgeon: Jalene Mullet, MD;  Location: [facility];  Service: Ophthalmology;  Laterality: Left;   LEFT HEART CATHETERIZATION WITH CORONARY ANGIOGRAM N/A 10/16/2011   Procedure: LEFT HEART CATHETERIZATION WITH CORONARY ANGIOGRAM;  Surgeon: Peter M Martinique, MD;  Location: [facility] CATH LAB;  Service: Cardiovascular;  Laterality: N/A;   LEFT HEART CATHETERIZATION WITH CORONARY/GRAFT ANGIOGRAM N/A 03/11/2013   Procedure: LEFT HEART CATHETERIZATION WITH [name];  Surgeon: Blane Ohara, MD;  Location: [facility]  CATH LAB;  Service: Cardiovascular;  Laterality: N/A;   lumbar spinal disk and neck fusion surgery     PACEMAKER INSERTION  03/28/12   PPM implanted for mobitz II AV block   PARS PLANA VITRECTOMY Left 10/04/2018   Procedure: PARS PLANA VITRECTOMY 25 GAUGE FOR ENDOPHTHALMITIS WITH INJECTION OF INTRAVITREAL ANTIBIOTIC;  Surgeon: Jalene Mullet, MD;  Location: [facility];  Service: Ophthalmology;  Laterality: Left;   peripheral vascular catherization  11-24-03   PERMANENT PACEMAKER INSERTION N/A 03/28/2012   Procedure: PERMANENT PACEMAKER INSERTION;  Surgeon: Thompson Grayer, MD;  Location: [facility] CATH LAB;  Service: Cardiovascular;  Laterality: N/A;   PROSTATECTOMY     renal circulation  10-01-03   s/p ptca     stents     X 2   stress cardiolite  05-04-05   spring 09-negative except for apical thinning, EF 68%    Social History   Socioeconomic History   Marital status: Married    Spouse name: [name]   Number of children: 2   Years of education: Not on file   Highest education level: Some college, no degree  Occupational History   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  married '52 - 3 years, divorced; married '56 - 3 years divorced; married '63-12 yrs - divorced; married '75 -. 1 son '57; 1 daughter - '53; 1 grandchild.  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  Very happily married.  End of life care: yes CPR, no long term mechanical ventilation, no heroic measures.right handed   Right handed   Social  Determinants of Health   Financial Resource Strain: Low Risk    Difficulty of Paying Living Expenses: Not hard  at all  Food Insecurity: No Food Insecurity   Worried About Charity fundraiser in the Last Year: Never true   Ran Out of Food in the Last Year: Never true  Transportation Needs: No Transportation Needs   Lack of Transportation (Medical): No   Lack of Transportation (Non-Medical): No  Physical Activity: Sufficiently Active   Days of Exercise per Week: 5 days   Minutes of Exercise per Session: 30 min  Stress: No Stress Concern Present   Feeling of Stress : Not at all  Social Connections: Socially Integrated   Frequency of Communication with Friends and Family: More than three times a week   Frequency of Social Gatherings with Friends and Family: Once a week   Attends Religious Services: More than 4 times per year   Active Member of [employer] or Organizations: Yes   Attends Music therapist: More than 4 times per year   Marital Status: Married    Family History  Problem Relation Age of Onset   Coronary artery disease Father        died @ 24   Other Mother        cerebral hemorrhage - died @ 22   Arthritis Mother    Cancer Brother        Bladder   Prostate cancer Brother    Nephritis Brother        died @ age 34.   Other Brother        cerebral hemorrhage - died @ 55   Heart disease Brother    Breast cancer Other        niece x 2   Diabetes Neg Hx    Colon cancer Neg Hx    Adrenal disorder Neg Hx     Review of Systems  Constitutional:  Negative for chills and fever.  Respiratory:  Positive for cough (since covid - couging up mucus intermittently) and shortness of breath (chronic - no change). Negative for wheezing.   Cardiovascular:  Positive for leg swelling. Negative for chest pain and palpitations.  Neurological:  Negative for light-headedness and headaches.      Objective:   Vitals:   11/08/20 1404  BP: 132/78  Pulse: 88  Temp: 98.7 F  (37.1 C)  SpO2: 98%   BP Readings from Last 3 Encounters:  11/08/20 132/78  11/01/20 (!) 149/73  10/18/20 (!) 147/76   Wt Readings from Last 3 Encounters:  11/08/20 158 lb (71.7 kg)  10/03/20 154 lb (69.9 kg)  09/01/20 155 lb (70.3 kg)   Body mass index is 22.67 kg/m.   Physical Exam    Constitutional: Appears well-developed and well-nourished. No distress.  HENT:  Head: Normocephalic and atraumatic.  Neck: Neck supple. No tracheal deviation present. No thyromegaly present.  No cervical lymphadenopathy Cardiovascular: Normal rate, regular rhythm and normal heart sounds.   2/6 systolic murmur heard. No carotid bruit .  No edema Pulmonary/Chest: Effort normal and breath sounds normal. No respiratory distress. No has no wheezes. No rales.  Skin: Skin is warm and dry. Not diaphoretic.  Psychiatric: Normal mood and affect. Behavior is normal.      Assessment & Plan:    See Problem List for Assessment and Plan of chronic medical problems.

## 2020-11-08 NOTE — Patient Instructions (Addendum)
     Medications changes include :   none     A referral was ordered for  neuropsychology.       Someone from their office will call you to schedule an appointment.    Please followup in 6 months

## 2020-11-08 NOTE — Assessment & Plan Note (Signed)
Chronic BP well controlled Continue amlodipine 5 mg daily  

## 2020-11-15 ENCOUNTER — Other Ambulatory Visit: Payer: Self-pay

## 2020-11-15 ENCOUNTER — Encounter (HOSPITAL_COMMUNITY)
Admission: RE | Admit: 2020-11-15 | Discharge: 2020-11-15 | Disposition: A | Discharge status: Home / Self Care | Payer: Medicare Other | Source: Ambulatory Visit | Attending: Nephrology | Admitting: Nephrology

## 2020-11-15 VITALS — BP 117/82 | HR 95 | Temp 98.5°F | Resp 18

## 2020-11-15 DIAGNOSIS — D631 Anemia in chronic kidney disease: Secondary | ICD-10-CM | POA: Insufficient documentation

## 2020-11-15 DIAGNOSIS — N183 Chronic kidney disease, stage 3 unspecified: Secondary | ICD-10-CM | POA: Insufficient documentation

## 2020-11-15 LAB — IRON AND TIBC
Iron: 52 ug/dL (ref 45–182)
Saturation Ratios: 19 % (ref 17.9–39.5)
TIBC: 277 ug/dL (ref 250–450)
UIBC: 225 ug/dL

## 2020-11-15 LAB — POCT HEMOGLOBIN-HEMACUE: Hemoglobin: 11.8 g/dL — ABNORMAL LOW (ref 13.0–17.0)

## 2020-11-15 LAB — FERRITIN: Ferritin: 187 ng/mL (ref 24–336)

## 2020-11-15 MED ORDER — EPOETIN ALFA-EPBX 10000 UNIT/ML IJ SOLN: 20000.0000 [IU] | INTRAMUSCULAR | Status: DC

## 2020-11-15 MED ORDER — EPOETIN ALFA-EPBX 10000 UNIT/ML IJ SOLN
INTRAMUSCULAR | Status: AC
  Administered 2020-11-15: 20000 [IU] via SUBCUTANEOUS
  Filled 2020-11-15: qty 2

## 2020-11-17 ENCOUNTER — Encounter: Payer: Self-pay | Admitting: Psychology

## 2020-11-21 ENCOUNTER — Ambulatory Visit (INDEPENDENT_AMBULATORY_CARE_PROVIDER_SITE_OTHER): Payer: Medicare Other

## 2020-11-21 ENCOUNTER — Other Ambulatory Visit: Payer: Self-pay

## 2020-11-21 DIAGNOSIS — E538 Deficiency of other specified B group vitamins: Secondary | ICD-10-CM

## 2020-11-21 MED ORDER — CYANOCOBALAMIN 1000 MCG/ML IJ SOLN
1000.0000 ug | Freq: Once | INTRAMUSCULAR | Status: AC
  Administered 2020-11-21: 1000 ug via INTRAMUSCULAR

## 2020-11-21 NOTE — Progress Notes (Signed)
Pt was given B12 w/o any complications. 

## 2020-11-25 ENCOUNTER — Encounter: Payer: Self-pay | Admitting: Psychology

## 2020-11-29 ENCOUNTER — Other Ambulatory Visit: Payer: Self-pay

## 2020-11-29 ENCOUNTER — Encounter (HOSPITAL_COMMUNITY)
Admission: RE | Admit: 2020-11-29 | Discharge: 2020-11-29 | Disposition: A | Discharge status: Home / Self Care | Payer: Medicare Other | Source: Ambulatory Visit | Attending: Nephrology | Admitting: Nephrology

## 2020-11-29 VITALS — BP 155/79 | HR 80 | Temp 98.1°F | Resp 18

## 2020-11-29 DIAGNOSIS — N183 Chronic kidney disease, stage 3 unspecified: Secondary | ICD-10-CM | POA: Diagnosis not present

## 2020-11-29 LAB — POCT HEMOGLOBIN-HEMACUE: Hemoglobin: 12.8 g/dL — ABNORMAL LOW (ref 13.0–17.0)

## 2020-11-29 MED ORDER — EPOETIN ALFA-EPBX 10000 UNIT/ML IJ SOLN: 20000.0000 [IU] | INTRAMUSCULAR | Status: DC

## 2020-11-29 MED ORDER — EPOETIN ALFA-EPBX 10000 UNIT/ML IJ SOLN
INTRAMUSCULAR | Status: AC
  Filled 2020-11-29: qty 2

## 2020-12-06 DIAGNOSIS — M79671 Pain in right foot: Secondary | ICD-10-CM | POA: Insufficient documentation

## 2020-12-06 HISTORY — DX: Pain in right foot: M79.671

## 2020-12-07 NOTE — Progress Notes (Signed)
Subjective:    Patient ID: [name], male    DOB: 03/09/1926, 87 y.o.   MRN: [id number]  This visit occurred during the SARS-CoV-2 public health emergency.  Safety protocols were in place, including screening questions prior to the visit, additional usage of staff PPE, and extensive cleaning of exam room while observing appropriate contact time as indicated for disinfecting solutions.    HPI The patient is here for an acute visit.   Nose bleeds - they started 11/20 - he had bleeding 4 times that day.  He had bleeding a few times that week.  It is the right nostril. It takes a while for it to stop bleeding.  It last bled about one week ago.    Had labs recently.  He is taking all of his medications as prescribed.    Medications and allergies reviewed with patient and updated if appropriate.  Patient Active Problem List   Diagnosis Date Noted   Epistaxis 12/08/2020   Weakness of both lower extremities 04/09/2020   Anxiety and depression 04/09/2020   Numbness of left foot 10/21/2019   S/P inguinal hernia repair 08/13/2019   Left inguinal hernia 07/01/2019   Gout 05/12/2018   Myoclonic jerking 02/10/2018   Chronic diastolic CHF (congestive heart failure) ([place]) 02/05/2018   UGIB (upper gastrointestinal bleed) 02/01/2018   Lumbar post-laminectomy syndrome 12/12/2017   Pain of left hip joint 11/29/2017   Trochanteric bursitis of left hip 11/29/2017   Hyperpigmentation 11/04/2017   Spinal stenosis of lumbar region 10/09/2017   Frequent urination 09/03/2017   Gouty arthritis of right great toe 09/03/2017   Postnasal drip 08/06/2017   Rash and nonspecific skin eruption 08/06/2017   Moderate aortic regurgitation 67/61/9509   Diastolic dysfunction 32/67/1245   Edema 06/29/2017   SOB (shortness of breath) 06/29/2017   Sinus symptom 06/05/2017   Fatigue 05/18/2017   Sleeping difficulty 05/18/2017   Trochanteric bursitis 01/10/2017   Internal hemorrhoids 08/15/2016    Dysphagia 02/06/2016   Cephalalgia 07/14/2015   Constipation 03/01/2015   CHB (complete heart block) ([place]) 05/03/2012   Postoperative atrial fibrillation ([place]) 05/03/2012   Pacemaker 04/10/2012   AV block, 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 03/26/2012   Coronary atherosclerosis 11/27/2011   Presence of aortocoronary bypass graft 10/24/2011   Pulmonary nodule    CAD (coronary artery disease)    CKD (chronic kidney disease) stage 3, GFR 30-59 ml/min (HCC)    Venous insufficiency of leg 06/05/2010   SHOULDER PAIN, RIGHT, CHRONIC 10/14/2009   B12 deficiency 08/23/2009   Peripheral neuropathy 03/21/2009   DEGENERATIVE DISC DISEASE, CERVICAL SPINE, W/RADICULOPATHY 09/21/2008   Dyslipidemia 01/17/2007   Essential hypertension 07/20/2006   Prostate cancer ([place]) 07/20/2006    Current Outpatient Medications on File Prior to Visit  Medication Sig Dispense Refill   acetaminophen (TYLENOL) 500 MG tablet Take 500 mg by mouth every 6 (six) hours as needed.     allopurinol (ZYLOPRIM) 100 MG tablet TAKE 1 TABLET(100 MG) BY MOUTH DAILY 90 tablet 1   amLODipine (NORVASC) 5 MG tablet Take 1 tablet (5 mg total) by mouth daily. 90 tablet 2   Aromatic Inhalants (VICKS VAPOR INHALER IN) Place 1 puff into both nostrils as needed (for congestion).     aspirin EC 81 MG tablet Take 81 mg by mouth daily.     Calcium Citrate-Vitamin D (CITRACAL + D PO) Take 2 tablets by mouth in the morning and at bedtime.     Carboxymeth-Glycerin-Polysorb (REFRESH OPTIVE  MEGA-3 OP) Place 1 drop into both eyes 2 (two) times daily.     clobetasol cream (TEMOVATE) 0.05 % Apply topically 2 (two) times daily.     Cyanocobalamin (VITAMIN B12) 1000 MCG TBCR 1 tablet     epoetin alfa-epbx (RETACRIT) 2000 UNIT/ML injection 2,000 Units every 14 (fourteen) days. Patient states he only gets it when he needs it but not every 2 weeks     folic acid (FOLVITE) 1 MG tablet Take 1 tablet (1 mg total) by mouth daily. Annual appt due in May must  see provider for future refills 90 tablet 0   loratadine (CLARITIN) 10 MG tablet Take 10 mg by mouth daily as needed (for seasonal allergies).     Multiple Vitamins-Minerals (PRESERVISION AREDS 2 PO) Take by mouth.     nitroGLYCERIN (NITROSTAT) 0.4 MG SL tablet DISSOLVE 1 TABLET UNDER THE TONGUE EVERY 5 MINUTES AS NEEDED FOR CHEST PAIN, MAXIMUM 3 TABLETS 100 tablet 1   Saline (AYR NASAL MIST ALLERGY/SINUS NA) Place into the nose.     vitamin C (ASCORBIC ACID) 500 MG tablet Take 1,000 mg by mouth daily.      Vitamin D, Ergocalciferol, (DRISDOL) 1.25 MG (50000 UT) CAPS capsule Take 50,000 Units by mouth every 30 (thirty) days.      furosemide (LASIX) 40 MG tablet Take 1 tablet (40 mg total) by mouth daily as needed for edema. 30 tablet 0   traMADol (ULTRAM) 50 MG tablet Take 50 mg by mouth 3 (three) times daily as needed.     Current Facility-Administered Medications on File Prior to Visit  Medication Dose Route Frequency Provider Last Rate Last Admin   NON FORMULARY 1 application  1 application Topical PRN Landis Martins, DPM        Past Medical History:  Diagnosis Date   Acute superficial venous thrombosis of lower extremity    a. RLE after CABG, neg dopp for DVT.   Allergy    Anemia    Atrial fibrillation ([place])    a. Post-op from CABG, on amiodarone temporarily, d/c'd 12/2011   CAD (coronary artery disease)    a. S/P stenting to mid RCA, prox PDA 06/1999. b. NSTEMI/CABG x 3 in 10/2011 with LIMA to LAD, SVG to PDA, and SVG to OM1.    Chronic UTI    a. Followed by Dr. Risa Grill - colonized/asymptomatic - not on abx   CKD (chronic kidney disease), stage III ([facility])    a. stable with a creatinine around 1.9-2.0 followed by nephrology.   Dyslipidemia    Echocardiogram    Echocardiogram 08/2018: EF 50-55, inf-lat AK, mild LVH, Gr 1 DD, RVSP 47, mild LAE, mild to mod MR, mild AI, mild AS (mean 11)   GERD (gastroesophageal reflux disease)    Hypertension    well-controlled.   Myocardial  infarction ([place])    Neck injury    a. C3-C4 and C4-C5 foraminal narrowing, severe   Prostate cancer ([place])    a. 2001 s/p TURP.   Pulmonary nodule    a. felt to be noncancerous.  Status post followup CT scan 4 mm and stable.   Renal artery stenosis (HCC)    a. 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    Past Surgical History:  Procedure Laterality Date   ANTERIOR CHAMBER WASHOUT Left 10/04/2018   Procedure: Anterior Chamber Washout, Vitreous Tap;  Surgeon: Jalene Mullet, MD;  Location: [facility];  Service: Ophthalmology;  Laterality: Left;   cardia catherization  07-07-99   CARDIAC SURGERY  10/18/12   open heart surgery   CATARACT EXTRACTION W/ INTRAOCULAR LENS  IMPLANT, BILATERAL  3/205, 06/2013   [facility]   COLONOSCOPY  04/12/07   CORONARY ARTERY BYPASS GRAFT  10/19/2011   Procedure: CORONARY ARTERY BYPASS GRAFTING (CABG);  Surgeon: Gaye Pollack, MD;  Location: [facility];  Service: Open Heart Surgery;  Laterality: N/A;  times three using Left Internal Mammary Artery and Right Greater Saphenouse Vein Graft harvested Endoscopically   edg  07-17-1994   FLEXIBLE SIGMOIDOSCOPY  11-03-1997   GAS INSERTION Left 10/04/2018   Procedure: Insertion Of Gas;  Surgeon: Jalene Mullet, MD;  Location: [facility];  Service: Ophthalmology;  Laterality: Left;   GAS/FLUID EXCHANGE Left 10/04/2018   Procedure: Gas/Fluid Exchange;  Surgeon: Jalene Mullet, MD;  Location: [facility];  Service: Ophthalmology;  Laterality: Left;   LEFT HEART CATHETERIZATION WITH CORONARY ANGIOGRAM N/A 10/16/2011   Procedure: LEFT HEART CATHETERIZATION WITH CORONARY ANGIOGRAM;  Surgeon: Peter M Martinique, MD;  Location: [facility] CATH LAB;  Service: Cardiovascular;  Laterality: N/A;   LEFT HEART CATHETERIZATION WITH CORONARY/GRAFT ANGIOGRAM N/A 03/11/2013   Procedure: LEFT HEART CATHETERIZATION WITH [name];  Surgeon: Blane Ohara, MD;  Location[facility] C[facility] LAB;  Service:  Cardiovascular;  Laterality: N/A;   lumbar spinal disk and neck fusion surgery     PACEMAKER INSERTION  03/28/12   PPM implanted for mobitz II AV block   PARS PLANA VITRECTOMY Left 10/04/2018   Procedure: PARS PLANA VITRECTOMY 25 GAUGE FOR ENDOPHTHALMITIS WITH INJECTION OF INTRAVITREAL ANTIBIOTIC;  Surgeon: Jalene Mullet, MD;  Location: [facility];  Service: Ophthalmology;  Laterality: Left;   peripheral vascular catherization  11-24-03   PERMANENT PACEMAKER INSERTION N/A 03/28/2012   Procedure: PERMANENT PACEMAKER INSERTION;  Surgeon: Thompson Grayer, MD;  Location: [facility] CATH LAB;  Service: Cardiovascular;  Laterality: N/A;   PROSTATECTOMY     renal circulation  10-01-03   s/p ptca     stents     X 2   stress cardiolite  05-04-05   spring 09-negative except for apical thinning, EF 68%    Social History   Socioeconomic History   Marital status: Married    Spouse name: [name]   Number of children: 2   Years of education: Not on file   Highest education level: Some college, no degree  Occupational History   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  married '52 - 3 years, divorced; married '56 - 3 years divorced; married '3-12 yrs - divorced; married '75 -. 1 son '57; 1 daughter - '53; 1 grandchild.  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  Very happily married.  End of life care: yes CPR, no long term mechanical ventilation, no heroic measures.right handed   Right handed   Social Determinants of Adult nurse  Strain: Low Risk    Difficulty of Paying Living Expenses: Not hard at all  Food Insecurity: No Food Insecurity   Worried About Charity fundraiser in the Last Year: Never true   Ran Out of Food in the Last Year: Never true  Transportation Needs: No Transportation Needs   Lack of Transportation (Medical): No   Lack of Transportation (Non-Medical): No  Physical Activity: Sufficiently Active   Days of Exercise per Week: 5 days   Minutes of Exercise per Session: 30 min  Stress: No Stress Concern Present   Feeling of Stress : Not at all  Social Connections: Socially Integrated   Frequency of Communication with Friends and Family: More than three times a week   Frequency of Social Gatherings with Friends and Family: Once a week   Attends Religious Services: More than 4 times per year   Active Member of [employer] or Organizations: Yes   Attends Music therapist: More than 4 times per year   Marital Status: Married    Family History  Problem Relation Age of Onset   Coronary artery disease Father        died @ 77   Other Mother        cerebral hemorrhage - died @ 4   Arthritis Mother    Cancer Brother        Bladder   Prostate cancer Brother    Nephritis Brother        died @ age 61.   Other Brother        cerebral hemorrhage - died @ 25   Heart disease Brother    Breast cancer Other        niece x 2   Diabetes Neg Hx    Colon cancer Neg Hx    Adrenal disorder Neg Hx     Review of Systems     Objective:   Vitals:   12/08/20 0755  BP: 130/72  Pulse: 79  Temp: 98 F (36.7 C)  SpO2: 96%   BP Readings from Last 3 Encounters:  12/08/20 130/72  11/29/20 (!) 155/79  11/15/20 117/82   Wt Readings from Last 3 Encounters:  12/08/20 161 lb 6.4 oz (73.2 kg)  11/08/20 158 lb (71.7 kg)  10/03/20 154 lb (69.9 kg)   Body mass index is 23.16 kg/m.   Physical Exam Constitutional:      General: He is not in acute distress.    Appearance:  Normal appearance. He is not ill-appearing.  HENT:     Head: Normocephalic and atraumatic.     Nose: Nose normal. No congestion or rhinorrhea.     Comments: Right nostril w/o active bleeding, no obvious scab Skin:    General: Skin is warm and dry.  Neurological:     Mental Status: He is alert.      Lab Results  Component Value Date   WBC 5.5 10/03/2020   HGB 12.8 (L) 11/29/2020   HCT 40.3 10/03/2020   PLT 237 10/03/2020   GLUCOSE 102 (H) 10/03/2020   CHOL 122 09/04/2016   TRIG 59.0 09/04/2016   HDL 40.40 09/04/2016   LDLCALC 70 09/04/2016   ALT 9 05/19/2020   AST 16 05/19/2020   NA 138 10/03/2020   K 4.2 10/03/2020   CL 106 10/03/2020   CREATININE 2.00 (H) 10/03/2020   BUN 44 (H) 10/03/2020   CO2 22 10/03/2020   TSH 1.827 02/02/2018   PSA 0.44 07/18/2007   INR 1.2 (  H) 03/04/2013         Assessment & Plan:    See Problem List for Assessment and Plan of chronic medical problems.

## 2020-12-08 ENCOUNTER — Ambulatory Visit (INDEPENDENT_AMBULATORY_CARE_PROVIDER_SITE_OTHER): Payer: Medicare Other | Admitting: Internal Medicine

## 2020-12-08 ENCOUNTER — Encounter: Payer: Self-pay | Admitting: Internal Medicine

## 2020-12-08 ENCOUNTER — Other Ambulatory Visit: Payer: Self-pay

## 2020-12-08 VITALS — BP 130/72 | HR 79 | Temp 98.0°F | Ht 70.0 in | Wt 161.4 lb

## 2020-12-08 DIAGNOSIS — R040 Epistaxis: Secondary | ICD-10-CM | POA: Diagnosis not present

## 2020-12-08 DIAGNOSIS — I10 Essential (primary) hypertension: Secondary | ICD-10-CM | POA: Diagnosis not present

## 2020-12-08 DIAGNOSIS — I2510 Atherosclerotic heart disease of native coronary artery without angina pectoris: Secondary | ICD-10-CM

## 2020-12-08 HISTORY — DX: Epistaxis: R04.0

## 2020-12-08 NOTE — Assessment & Plan Note (Signed)
Chronic BP well controlled - not related to nose bleeds Continue amlodipine 5 mg daily,

## 2020-12-08 NOTE — Assessment & Plan Note (Signed)
Acute  Right nostril - recurrent bleeding - last bled one week ago Discussed saline nasal spray, vaseline at night If bleeding recurs - apply pressure, has used ice, can use afrin occ if needed House is not dry If bleeding continues will need to see Dr Benjamine Mola

## 2020-12-08 NOTE — Patient Instructions (Addendum)
Use saline nasal spray in each nostril a few times a day  Get a humidifier for your bedroom or house  Use vaseline in the nostril at night to keep the mucus membrane moist  If the bleeding start you can use Afrin nasal spray and apply pressure as long as it takes to stop the bleeding.    If you continue to have recurrent bleeding you should see Dr Benjamine Mola.       Nosebleed, Adult A nosebleed is when blood comes out of the nose. Nosebleeds are common and can be caused by many things. They are usually not a sign of a serious medical problem. Follow these instructions at home: When you have a nosebleed:  Sit down. Tilt your head a little forward. Follow these steps: Pinch your nose with a clean towel or tissue. Keep pinching your nose for 5 minutes. Do not let go. After 5 minutes, let go of your nose. If there is still bleeding, do these steps again. Keep doing these steps until the bleeding stops. Do not put tissues or other things in your nose to stop the bleeding. Avoid lying down or putting your head back. Use a nose spray decongestant as told by your doctor. After a nosebleed: Try not to blow your nose or sniffle for several hours. Try not to strain, lift, or bend at the waist for several days. Aspirin and blood-thinning medicines make bleeding more likely. If you take these medicines: Ask your doctor if you should stop taking them or if you should change how much you take. Do not stop taking the medicine unless your doctor tells you to. If your nosebleed was caused by dryness, use over-the-counter saline nasal spray or gel and humidifier as told by your doctor. This will keep the inside of your nose moist and allow it to heal. If you need to use one of these products: Choose one that is water-soluble. Use only as much as you need and use it only as often as needed. Do not lie down right away after you use it. If you get nosebleeds often, talk with your doctor about  treatments. These may include: Nasal cautery. A chemical swab or electrical device is used to lightly burn tiny blood vessels inside the nose. This helps stop or prevent nosebleeds. Nasal packing. A gauze or other material is placed in the nose to keep constant pressure on the bleeding area. Contact a doctor if: You have a fever. You get nosebleeds often. You are getting nosebleeds more often than usual. You bruise very easily. You have something stuck in your nose. You have bleeding in your mouth. You vomit or cough up brown material. You get a nosebleed after you start a new medicine. Get help right away if: You have a nosebleed after you fall or hurt your head. Your nosebleed does not go away after 20 minutes. You feel dizzy or weak. You have unusual bleeding from other parts of your body. You have unusual bruising on other parts of your body. You get sweaty. You vomit blood. Summary Nosebleeds are common. They are usually not a sign of a serious medical problem. When you have a nosebleed, sit down and tilt your head a little forward. Pinch your nose with a clean tissue for 5 minutes. Use saline spray or saline gel and a humidifier as told by your doctor. Get help right away if your nosebleed does not go away after 20 minutes. This information is not intended to  replace advice given to you by your health care provider. Make sure you discuss any questions you have with your health care provider. Document Revised: 10/23/2018 Document Reviewed: 10/23/2018 Elsevier Patient Education  2022 [employer].

## 2020-12-13 ENCOUNTER — Encounter (HOSPITAL_COMMUNITY)
Admission: RE | Admit: 2020-12-13 | Discharge: 2020-12-13 | Disposition: A | Discharge status: Home / Self Care | Payer: Medicare Other | Source: Ambulatory Visit | Attending: Nephrology | Admitting: Nephrology

## 2020-12-13 VITALS — BP 161/79 | HR 73 | Temp 97.1°F

## 2020-12-13 DIAGNOSIS — D631 Anemia in chronic kidney disease: Secondary | ICD-10-CM | POA: Insufficient documentation

## 2020-12-13 DIAGNOSIS — N183 Chronic kidney disease, stage 3 unspecified: Secondary | ICD-10-CM | POA: Insufficient documentation

## 2020-12-13 LAB — IRON AND TIBC
Iron: 58 ug/dL (ref 45–182)
Saturation Ratios: 20 % (ref 17.9–39.5)
TIBC: 284 ug/dL (ref 250–450)
UIBC: 226 ug/dL

## 2020-12-13 LAB — FERRITIN: Ferritin: 173 ng/mL (ref 24–336)

## 2020-12-13 LAB — POCT HEMOGLOBIN-HEMACUE: Hemoglobin: 11.5 g/dL — ABNORMAL LOW (ref 13.0–17.0)

## 2020-12-13 MED ORDER — EPOETIN ALFA-EPBX 10000 UNIT/ML IJ SOLN: 20000.0000 [IU] | INTRAMUSCULAR | Status: DC

## 2020-12-13 MED ORDER — EPOETIN ALFA-EPBX 10000 UNIT/ML IJ SOLN
INTRAMUSCULAR | Status: AC
  Administered 2020-12-13: 20000 [IU] via SUBCUTANEOUS
  Filled 2020-12-13: qty 2

## 2020-12-14 ENCOUNTER — Ambulatory Visit: Payer: Medicare Other | Admitting: Cardiovascular Disease

## 2020-12-19 ENCOUNTER — Encounter: Payer: Self-pay | Admitting: Cardiovascular Disease

## 2020-12-19 ENCOUNTER — Other Ambulatory Visit: Payer: Self-pay

## 2020-12-19 ENCOUNTER — Ambulatory Visit (INDEPENDENT_AMBULATORY_CARE_PROVIDER_SITE_OTHER): Payer: Medicare Other | Admitting: Cardiovascular Disease

## 2020-12-19 VITALS — BP 146/80 | HR 70 | Ht 70.0 in | Wt 167.2 lb

## 2020-12-19 DIAGNOSIS — N1832 Chronic kidney disease, stage 3b: Secondary | ICD-10-CM | POA: Diagnosis not present

## 2020-12-19 DIAGNOSIS — I5032 Chronic diastolic (congestive) heart failure: Secondary | ICD-10-CM

## 2020-12-19 DIAGNOSIS — I350 Nonrheumatic aortic (valve) stenosis: Secondary | ICD-10-CM

## 2020-12-19 DIAGNOSIS — I2510 Atherosclerotic heart disease of native coronary artery without angina pectoris: Secondary | ICD-10-CM | POA: Diagnosis not present

## 2020-12-19 NOTE — Patient Instructions (Signed)
Medication Instructions:  Your physician recommends that you continue on your current medications as directed. Please refer to the Current Medication list given to you today.  *If you need a refill on your cardiac medications before your next appointment, please call your pharmacy*   Lab Work: None If you have labs (blood work) drawn today and your tests are completely normal, you will receive your results only by: [facility] (if you have MyChart) OR A paper copy in the mail If you have any lab test that is abnormal or we need to change your treatment, we will call you to review the results.   Testing/Procedures: Your physician has requested that you have an echocardiogram. Echocardiography is a painless test that uses sound waves to create images of your heart. It provides your doctor with information about the size and shape of your heart and how well your heart's chambers and valves are working. This procedure takes approximately one hour. There are no restrictions for this procedure.   Follow-Up: At [facility], you and your health needs are our priority.  As part of our continuing mission to provide you with exceptional heart care, we have created designated Provider Care Teams.  These Care Teams include your primary Cardiologist (physician) and Advanced Practice Providers (APPs -  Physician Assistants and Nurse Practitioners) who all work together to provide you with the care you need, when you need it.  We recommend signing up for the patient portal called "MyChart".  Sign up information is provided on this After Visit Summary.  MyChart is used to connect with patients for Virtual Visits (Telemedicine).  Patients are able to view lab/test results, encounter notes, upcoming appointments, etc.  Non-urgent messages can be sent to your provider as well.   To learn more about what you can do with MyChart, go to [email or web address].    Your next appointment:   6 month(s)  The  format for your next appointment:   In Person  Provider:   [name], MD     Other Instructions

## 2020-12-19 NOTE — Progress Notes (Signed)
Cardiology Office Note:    Date:  12/23/2020   ID:  [name], DOB [birth year], MRN [id number]  PCP:  [name], MD   [facility] Providers Cardiologist:  [name], MD     Referring MD: [name], MD   Chief Complaint  Patient presents with   Shortness of Breath    History of Present Illness:    [name] is a 87 y.o. male with a hx of coronary artery disease, chronic kidney disease, and aortic stenosis, presenting for follow-up evaluation.  The patient has a history of RCA stenting.  He ultimately was treated with multivessel CABG in 2013 with early graft failure of all of his venous conduits and continued patency of the mammary artery to LAD graft.  He had a nuclear stress test in 2019 demonstrating no significant ischemia.  Comorbid medical conditions include chronic diastolic heart failure, symptomatic bradycardia status post permanent pacemaker, chronic kidney disease stage 3, mixed hyperlipidemia, hypertension, prostate cancer, and chronic anemia.  The patient is here with his wife today.  He continues to have issues with fatigue and exertional dyspnea.  He is trying to stay as active as possible.  He has not had any chest pain, chest pressure, orthopnea, PND, or heart palpitations.  He has not had recent issues with leg swelling.  Past Medical History:  Diagnosis Date   Acute superficial venous thrombosis of lower extremity    a. RLE after CABG, neg dopp for DVT.   Allergy    Anemia    Atrial fibrillation ([place])    a. Post-op from CABG, on amiodarone temporarily, d/c'd 12/2011   CAD (coronary artery disease)    a. S/P stenting to mid RCA, prox PDA 06/1999. b. NSTEMI/CABG x 3 in 10/2011 with LIMA to LAD, SVG to PDA, and SVG to OM1.    Chronic UTI    a. Followed by Dr. Risa Grill - colonized/asymptomatic - not on abx   CKD (chronic kidney disease), stage III ([place])    a. stable with a creatinine around 1.9-2.0 followed by nephrology.    Dyslipidemia    Echocardiogram    Echocardiogram 08/2018: EF 50-55, inf-lat AK, mild LVH, Gr 1 DD, RVSP 47, mild LAE, mild to mod MR, mild AI, mild AS (mean 11)   GERD (gastroesophageal reflux disease)    Hypertension    well-controlled.   Myocardial infarction ([place])    Neck injury    a. C3-C4 and C4-C5 foraminal narrowing, severe   Prostate cancer ([place])    a. 2001 s/p TURP.   Pulmonary nodule    a. felt to be noncancerous.  Status post followup CT scan 4 mm and stable.   Renal artery stenosis ([facility])    a. 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    Past Surgical History:  Procedure Laterality Date   ANTERIOR CHAMBER WASHOUT Left 10/04/2018   Procedure: Anterior Chamber Washout, Vitreous Tap;  Surgeon: Jalene Mullet, MD;  Location: [place];  Service: Ophthalmology;  Laterality: Left;   cardia catherization  07-07-99   CARDIAC SURGERY  10/18/12   open heart surgery   CATARACT EXTRACTION W/ INTRAOCULAR LENS  IMPLANT, BILATERAL  3/205, 06/2013   [facility]   COLONOSCOPY  04/12/07   CORONARY ARTERY BYPASS GRAFT  10/19/2011   Procedure: CORONARY ARTERY BYPASS GRAFTING (CABG);  Surgeon: Gaye Pollack, MD;  Location: [place];  Service: Open Heart Surgery;  Laterality: N/A;  times three using Left Internal Mammary Artery and Right Greater [place] Vein Graft harvested Endoscopically   edg  07-17-1994   FLEXIBLE SIGMOIDOSCOPY  11-03-1997   GAS INSERTION Left 10/04/2018   Procedure: Insertion Of Gas;  Surgeon: Jalene Mullet, MD;  Location: [place];  Service: Ophthalmology;  Laterality: Left;   GAS/FLUID EXCHANGE Left 10/04/2018   Procedure: Gas/Fluid Exchange;  Surgeon: Jalene Mullet, MD;  Location: [place];  Service: Ophthalmology;  Laterality: Left;   LEFT HEART CATHETERIZATION WITH CORONARY ANGIOGRAM N/A 10/16/2011   Procedure: LEFT HEART CATHETERIZATION WITH CORONARY ANGIOGRAM;  Surgeon: Peter M Martinique, MD;  Location: [facility] CATH LAB;  Service:  Cardiovascular;  Laterality: N/A;   LEFT HEART CATHETERIZATION WITH CORONARY/GRAFT ANGIOGRAM N/A 03/11/2013   Procedure: LEFT HEART CATHETERIZATION WITH [name];  Surgeon: Blane Ohara, MD;  Location: [facility] CATH LAB;  Service: Cardiovascular;  Laterality: N/A;   lumbar spinal disk and neck fusion surgery     PACEMAKER INSERTION  03/28/12   PPM implanted for mobitz II AV block   PARS PLANA VITRECTOMY Left 10/04/2018   Procedure: PARS PLANA VITRECTOMY 25 GAUGE FOR ENDOPHTHALMITIS WITH INJECTION OF INTRAVITREAL ANTIBIOTIC;  Surgeon: Jalene Mullet, MD;  Location: [place];  Service: Ophthalmology;  Laterality: Left;   peripheral vascular catherization  11-24-03   PERMANENT PACEMAKER INSERTION N/A 03/28/2012   Procedure: PERMANENT PACEMAKER INSERTION;  Surgeon: Thompson Grayer, MD;  Location: [facility] CATH LAB;  Service: Cardiovascular;  Laterality: N/A;   PROSTATECTOMY     renal circulation  10-01-03   s/p ptca     stents     X 2   stress cardiolite  05-04-05   spring 09-negative except for apical thinning, EF 68%    Current Medications: Current Meds  Medication Sig   acetaminophen (TYLENOL) 500 MG tablet Take 500 mg by mouth every 6 (six) hours as needed.   allopurinol (ZYLOPRIM) 100 MG tablet TAKE 1 TABLET(100 MG) BY MOUTH DAILY   amLODipine (NORVASC) 5 MG tablet Take 1 tablet (5 mg total) by mouth daily.   Aromatic Inhalants (VICKS VAPOR INHALER IN) Place 1 puff into both nostrils as needed (for congestion).   aspirin EC 81 MG tablet Take 81 mg by mouth daily.   Calcium Citrate-Vitamin D (CITRACAL + D PO) Take 2 tablets by mouth in the morning and at bedtime.   Carboxymeth-Glycerin-Polysorb (REFRESH OPTIVE MEGA-3 OP) Place 1 drop into both eyes 2 (two) times daily.   clobetasol cream (TEMOVATE) 0.05 % Apply topically 2 (two) times daily.   Cyanocobalamin (VITAMIN B12) 1000 MCG TBCR 1 tablet   epoetin alfa-epbx (RETACRIT) 2000 UNIT/ML injection 2,000 Units every 14 (fourteen) days.  Patient states he only gets it when he needs it but not every 2 weeks   folic acid (FOLVITE) 1 MG tablet Take 1 tablet (1 mg total) by mouth daily. Annual appt due in May must see provider for future refills   furosemide (LASIX) 40 MG tablet Take 1 tablet (40 mg total) by mouth daily as needed for edema.   loratadine (CLARITIN) 10 MG tablet Take 10 mg by mouth daily as needed (for seasonal allergies).   Multiple Vitamins-Minerals (PRESERVISION AREDS 2 PO) Take by mouth.   nitroGLYCERIN (NITROSTAT) 0.4 MG SL tablet DISSOLVE 1 TABLET UNDER THE TONGUE EVERY 5 MINUTES AS NEEDED FOR CHEST PAIN, MAXIMUM 3 TABLETS   Saline (AYR NASAL MIST ALLERGY/SINUS NA) Place into the nose.   traMADol (ULTRAM) 50 MG tablet Take 50 mg by  mouth 3 (three) times daily as needed.   vitamin C (ASCORBIC ACID) 500 MG tablet Take 1,000 mg by mouth daily.    Vitamin D, Ergocalciferol, (DRISDOL) 1.25 MG (50000 UT) CAPS capsule Take 50,000 Units by mouth every 30 (thirty) days.    Current Facility-Administered Medications for the 12/19/20 encounter (Office Visit) with [name], MD  Medication   NON FORMULARY 1 application     Allergies:   Aspirin, Lisinopril, Amoxicillin, Atarax [hydroxyzine hcl], Cephalexin, Ciprofloxacin, Clindamycin, Clobetasol, Codeine, Fish allergy, Fluarix [influenza virus vaccine], Haemophilus influenzae, Hydrocodone, Hydrocodone-acetaminophen, Hydroxyzine, Latex, Macrobid [nitrofurantoin macrocrystal], Niacin, Niacin-lovastatin er, Niacin-lovastatin er, Nitrofurantoin, Omeprazole, Other, Tramadol, Vibramycin [doxycycline], Adhesive [tape], Bactrim [sulfamethoxazole-trimethoprim], Colchicine, Gabapentin, and Nortriptyline   Social History   Socioeconomic History   Marital status: Married    Spouse name: [name]   Number of children: 2   Years of education: Not on file   Highest education level: Some college, no degree  Occupational History   Occupation: Sales executive      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  married '52 - 3 years, divorced; married '56 - 3 years divorced; married '39-12 yrs - divorced; married '75 -. 1 son '57; 1 daughter - '53; 1 grandchild.  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  Very happily married.  End of life care: yes CPR, no long term mechanical ventilation, no heroic measures.right handed   Right handed   Social Determinants of Health   Financial Resource Strain: Low Risk    Difficulty of Paying Living Expenses: Not hard at all  Food Insecurity: No Food Insecurity   Worried About Charity fundraiser in the Last Year: Never true   Ran Out of Food in the Last Year: Never true  Transportation Needs: No Transportation Needs   Lack of Transportation (Medical): No   Lack of Transportation (Non-Medical): No  Physical Activity: Sufficiently Active   Days of Exercise per Week: 5 days   Minutes of Exercise per Session: 30 min  Stress: No Stress Concern Present   Feeling of Stress : Not at all  Social Connections: Socially Integrated   Frequency of Communication with Friends and Family: More than three times a week   Frequency of Social Gatherings with Friends and Family: Once a week   Attends Religious Services: More than 4 times per year   Active Member of [employer] or Organizations: Yes   Attends Music therapist: More than 4 times per year   Marital Status: Married     Family History: The patient's family history includes Arthritis in his mother; Breast cancer in an other  family member; Cancer in his brother; Coronary artery disease in his father; Heart disease in his brother; Nephritis in his brother; Other in his brother and mother; Prostate cancer in his brother. There is no history of Diabetes, Colon cancer, or Adrenal disorder.  ROS:   Please see the history of present illness.  All other systems reviewed and are negative.  EKGs/Labs/Other Studies Reviewed:    The following studies were reviewed today: 2D echocardiogram 06/22/2020: 1. Left ventricular ejection fraction, by estimation, is 60 to 65%. The  left ventricle has normal function. The left ventricle has no regional  wall motion abnormalities. The left ventricular internal cavity size was  mildly dilated. There is moderate  asymmetric left ventricular hypertrophy of the basal-septal segment. Left  ventricular diastolic parameters are consistent with Grade I diastolic  dysfunction (impaired relaxation).   2. Right ventricular systolic function is mildly reduced. The right  ventricular size is normal.   3. Left atrial size was mildly dilated.   4. The mitral valve is abnormal. Mild to moderate mitral valve  regurgitation.   5. The aortic valve is tricuspid. Aortic valve regurgitation is mild to  moderate. Moderate aortic valve stenosis. Aortic regurgitation PHT  measures 391 msec. Aortic valve area, by VTI measures 1.50 cm. Aortic  valve mean gradient measures 15.5 mmHg.  Aortic valve Vmax measures 2.66 m/s. DI is 0.36.   Comparison(s): Changes from prior study are noted. 07/07/2019: LVEF 55-60%,  mild AS -mean gradient 13 mmHg.   Recent Labs: 05/19/2020: ALT 9 05/23/2020: B Natriuretic Peptide 415.0 10/03/2020: BUN 44; Creatinine, Ser 2.00; Platelets 237; Potassium 4.2; Sodium 138 12/13/2020: Hemoglobin 11.5  Recent Lipid Panel    Component Value Date/Time   CHOL 122 09/04/2016 1123   TRIG 59.0 09/04/2016 1123   TRIG 107 01/16/2006 1338   HDL 40.40 09/04/2016 1123   CHOLHDL 3  09/04/2016 1123   VLDL 11.8 09/04/2016 1123   LDLCALC 70 09/04/2016 1123     Risk Assessment/Calculations:           Physical Exam:    VS:  BP (!) 146/80   Pulse 70   Ht 5' 10"  (1.778 m)   Wt 167 lb 3.2 oz (75.8 kg)   SpO2 96%   BMI 23.99 kg/m     Wt Readings from Last 3 Encounters:  12/19/20 167 lb 3.2 oz (75.8 kg)  12/08/20 161 lb 6.4 oz (73.2 kg)  11/08/20 158 lb (71.7 kg)     GEN:  Well nourished, well developed in no acute distress HEENT: Normal NECK: No JVD; No carotid bruits LYMPHATICS: No lymphadenopathy CARDIAC: RRR, 2/6 harsh mid peaking systolic murmur at the right upper sternal border, diminished A2 RESPIRATORY:  Clear to auscultation without rales, wheezing or rhonchi  ABDOMEN: Soft, non-tender, non-distended MUSCULOSKELETAL: Trace bilateral pretibial edema; No deformity  SKIN: Warm and dry NEUROLOGIC:  Alert and oriented x 3 PSYCHIATRIC:  Normal affect   ASSESSMENT:    1. Nonrheumatic aortic valve stenosis   2. Coronary artery disease involving native coronary artery of native heart without angina pectoris   3. Stage 3b chronic kidney disease ([place])   4. Chronic diastolic CHF (congestive heart failure) (HCC)    PLAN:    In order of problems listed above:  The patient has at least moderate aortic stenosis.  He may be progressing towards the severe range and I have recommended a repeat echocardiogram in 6 months.  He should have at least annual echo surveillance.  The patient understands that his chronic kidney disease could be a significant issue to consider if he progresses to severe aortic stenosis and requires TAVR.  We discussed all of this at length today.  I will see him back in 6 months with an echocardiogram.  We discussed cardinal symptoms of aortic stenosis and he  understands to contact us if his symptoms worsen. No angina at present.  He will continue his current medical therapy. Reviewed most recent labs.  The patient's chronic kidney  disease appears stable with most recent creatinine of 2.0 and EGFR 31. Stable on current medical therapy with New York Heart Association functional class II symptoms.    Medication Adjustments/Labs and Tests Ordered: Current medicines are reviewed at length with the patient today.  Concerns regarding medicines are outlined above.  Orders Placed This Encounter  Procedures   ECHOCARDIOGRAM COMPLETE   No orders of the defined types were placed in this encounter.   Patient Instructions  Medication Instructions:  Your physician recommends that you continue on your current medications as directed. Please refer to the Current Medication list given to you today.  *If you need a refill on your cardiac medications before your next appointment, please call your pharmacy*   Lab Work: None If you have labs (blood work) drawn today and your tests are completely normal, you will receive your results only by: [place] (if you have MyChart) OR A paper copy in the mail If you have any lab test that is abnormal or we need to change your treatment, we will call you to review the results.   Testing/Procedures: Your physician has requested that you have an echocardiogram. Echocardiography is a painless test that uses sound waves to create images of your heart. It provides your doctor with information about the size and shape of your heart and how well your heart's chambers and valves are working. This procedure takes approximately one hour. There are no restrictions for this procedure.   Follow-Up: At [facility], you and your health needs are our priority.  As part of our continuing mission to provide you with exceptional heart care, we have created designated Provider Care Teams.  These Care Teams include your primary Cardiologist (physician) and Advanced Practice Providers (APPs -  Physician Assistants and Nurse Practitioners) who all work together to provide you with the care you need, when  you need it.  We recommend signing up for the patient portal called "MyChart".  Sign up information is provided on this After Visit Summary.  MyChart is used to connect with patients for Virtual Visits (Telemedicine).  Patients are able to view lab/test results, encounter notes, upcoming appointments, etc.  Non-urgent messages can be sent to your provider as well.   To learn more about what you can do with MyChart, go to [email or web address].    Your next appointment:   6 month(s)  The format for your next appointment:   In Person  Provider:   [name], MD     Other Instructions     Signed, [name], MD  12/23/2020 5:56 PM    [place]

## 2020-12-22 ENCOUNTER — Other Ambulatory Visit: Payer: Self-pay

## 2020-12-22 ENCOUNTER — Ambulatory Visit (INDEPENDENT_AMBULATORY_CARE_PROVIDER_SITE_OTHER): Payer: Medicare Other

## 2020-12-22 DIAGNOSIS — E538 Deficiency of other specified B group vitamins: Secondary | ICD-10-CM

## 2020-12-22 MED ORDER — CYANOCOBALAMIN 1000 MCG/ML IJ SOLN
1000.0000 ug | Freq: Once | INTRAMUSCULAR | Status: AC
  Administered 2020-12-22: 1000 ug via INTRAMUSCULAR

## 2020-12-22 NOTE — Progress Notes (Signed)
Pt came into the office to receive his b12 injection. He tolerated the injection well.

## 2020-12-23 ENCOUNTER — Encounter: Payer: Self-pay | Admitting: Cardiovascular Disease

## 2020-12-24 ENCOUNTER — Other Ambulatory Visit: Payer: Self-pay | Admitting: Internal Medicine

## 2020-12-26 ENCOUNTER — Other Ambulatory Visit: Payer: Self-pay | Admitting: Cardiovascular Disease

## 2020-12-27 ENCOUNTER — Encounter (HOSPITAL_COMMUNITY)
Admission: RE | Admit: 2020-12-27 | Discharge: 2020-12-27 | Disposition: A | Discharge status: Home / Self Care | Payer: Medicare Other | Source: Ambulatory Visit | Attending: Nephrology | Admitting: Nephrology

## 2020-12-27 ENCOUNTER — Other Ambulatory Visit: Payer: Self-pay

## 2020-12-27 VITALS — BP 132/64 | HR 79 | Temp 97.4°F | Resp 18

## 2020-12-27 DIAGNOSIS — N183 Chronic kidney disease, stage 3 unspecified: Secondary | ICD-10-CM

## 2020-12-27 LAB — POCT HEMOGLOBIN-HEMACUE: Hemoglobin: 12.2 g/dL — ABNORMAL LOW (ref 13.0–17.0)

## 2020-12-27 MED ORDER — EPOETIN ALFA-EPBX 10000 UNIT/ML IJ SOLN: 20000.0000 [IU] | INTRAMUSCULAR | Status: DC

## 2021-01-05 ENCOUNTER — Ambulatory Visit (INDEPENDENT_AMBULATORY_CARE_PROVIDER_SITE_OTHER): Payer: Medicare Other | Admitting: Psychology

## 2021-01-05 ENCOUNTER — Other Ambulatory Visit: Payer: Self-pay | Admitting: Internal Medicine

## 2021-01-05 ENCOUNTER — Other Ambulatory Visit: Payer: Self-pay

## 2021-01-05 ENCOUNTER — Ambulatory Visit: Payer: Medicare Other | Admitting: Psychology

## 2021-01-05 ENCOUNTER — Encounter: Payer: Self-pay | Admitting: Psychology

## 2021-01-05 DIAGNOSIS — M2550 Pain in unspecified joint: Secondary | ICD-10-CM | POA: Insufficient documentation

## 2021-01-05 DIAGNOSIS — M1A00X0 Idiopathic chronic gout, unspecified site, without tophus (tophi): Secondary | ICD-10-CM | POA: Insufficient documentation

## 2021-01-05 DIAGNOSIS — I679 Cerebrovascular disease, unspecified: Secondary | ICD-10-CM

## 2021-01-05 DIAGNOSIS — I219 Acute myocardial infarction, unspecified: Secondary | ICD-10-CM | POA: Insufficient documentation

## 2021-01-05 DIAGNOSIS — G4720 Circadian rhythm sleep disorder, unspecified type: Secondary | ICD-10-CM

## 2021-01-05 DIAGNOSIS — R4189 Other symptoms and signs involving cognitive functions and awareness: Secondary | ICD-10-CM

## 2021-01-05 DIAGNOSIS — R413 Other amnesia: Secondary | ICD-10-CM | POA: Diagnosis not present

## 2021-01-05 DIAGNOSIS — M5136 Other intervertebral disc degeneration, lumbar region: Secondary | ICD-10-CM | POA: Insufficient documentation

## 2021-01-05 NOTE — Progress Notes (Signed)
NEUROPSYCHOLOGICAL EVALUATION [place] of Neurology  Date of Evaluation: January 05, 2021  Reason for Referral:   [name] is a 87 y.o. right-handed Caucasian male referred by  [name], M.D. , to characterize his current cognitive functioning and assist with diagnostic clarity and treatment planning in the context of subjective cognitive decline.   Assessment and Plan:   Clinical Impression(s): [name]' pattern of performance is suggestive of neuropsychological functioning largely within normal limits relative to age-matched peers. He did exhibit some trouble learning and later recalling a list of words. However, his performance was compared to individuals younger than himself (i.e., 72-59 years old) on this task and performances across other verbal and visual memory tasks were strong. A weakness was also exhibited across a task assessing receptive language. However, this likely represents a normative weakness rather than a functional/clinical weakness as he only missed two items across the entirety of this task. Performance variability was also noted across confrontation naming; however, his performance was much improved across a more comprehensive and sensitive task relative to lower performance on a brief screening task. Thus, I do not believe there is enough to warrant an official diagnosis surrounding a mild cognitive disorder. Performances were consistently appropriate across processing speed, attention/concentration, executive functioning, safety/judgment, verbal fluency, and visuospatial abilities. [name] denied difficulties completing instrumental activities of daily living (ADLs) independently.   [name] attributed memory dysfunction solely to side effects of him taking a single injection of Eligard approximately 13-14 months ago. Memory loss is a "common" side effect of Eligard, affecting anywhere between 1%  and 10% of individuals who take this medication according to several online resources. There has also been research to suggest that broad treatment with androgen deprivation therapy (ADT) agents such as Eligard can negatively impact cognitive abilities. Research surrounding ADT treatment and an increased risk for dementia appears mixed and still in early stages at the present time. There certainly remains a possibility that mild weaknesses across certain aspects of testing and subjective day-to-day dysfunction could be influenced by medication side effects. However, it would be strange than dysfunction lasting for over 12 months would be caused by a single injection rather than continuous treatment. Overall, I find it more likely that dysfunction is caused by his history of numerous cardiovascular medical ailments (e.g., hypertension, coronary artery disease, complete heart block, postoperative atrial fibrillation, moderate aortic regurgitation, congestive heart failure, and prior myocardial infarction) which are further exacerbated by both chronic sleep dysfunction and said medication side effects. Continued medical monitoring will be important moving forward.   Specific to memory, [name] was able to learn novel verbal and visual information well. While there was some trouble recalling a previously learned list of words, other delayed recall tasks were appropriate. Overall, memory performance combined with intact performances across other areas of cognitive functioning is not suggestive of late onset Alzheimer's disease. Likewise, his cognitive and behavioral profile is not suggestive of any other form of neurodegenerative illness presently.  Recommendations: A repeat neuropsychological evaluation in 12-18 months (or sooner if functional decline is noted) is recommended to assess the trajectory of future cognitive decline should it occur. This will also aid in future efforts towards improved diagnostic  clarity.  [name] is encouraged to attend to lifestyle factors for brain health (e.g., regular physical exercise, good nutrition habits, regular participation in cognitively-stimulating activities, and general stress management techniques), which are likely to have benefits for both emotional adjustment and  cognition. Optimal control of vascular risk factors (including safe cardiovascular exercise and adherence to dietary recommendations) is encouraged. Continued participation in activities which provide mental stimulation and social interaction is also recommended.   Memory can be improved using internal strategies such as rehearsal, repetition, chunking, mnemonics, association, and imagery. External strategies such as written notes in a consistently used memory journal, visual and nonverbal auditory cues such as a calendar on the refrigerator or appointments with alarm, such as on a cell phone, can also help maximize recall.    Because he shows better recall for structured information, he will likely understand and retain new information better if it is presented to him in a meaningful or well-organized manner at the outset, such as grouping items into meaningful categories or presenting information in an outlined, bulleted, or story format.   To address problems with processing speed, he may wish to consider:   -Ensuring that he is alerted when essential material or instructions are being presented   -Adjusting the speed at which new information is presented   -Allowing for more time in comprehending, processing, and responding in conversation  To address problems with fluctuating attention and multi-tasking, he may wish to consider:   -Avoiding external distractions when needing to concentrate   -Limiting exposure to fast paced environments with multiple sensory demands   -Writing down complicated information and using checklists   -Attempting and completing one task at a time (i.e., no  multi-tasking)   -Verbalizing aloud each step of a task to maintain focus   -Reducing the amount of information considered at one time  Review of Records:   [name] was seen by his PCP [name], M.D.) on 11/08/2020 for follow-up of various medical conditions. Stemming from this appointment, a referral for neuropsychological testing was generated due to reported memory dysfunction. I was unable to locate a specific description of experienced memory issues or the trajectory of these difficulties within Dr. Quay Burow' notation. Ultimately, Mr. Senger was referred for a comprehensive neuropsychological evaluation to characterize his cognitive abilities and to assist with diagnostic clarity and treatment planning.   Brain MRI on 08/11/2008 revealed age-related atrophy and mild small vessel ischemic changes. Head CT on 07/14/2015 was stable relative to findings of his prior MRI. Head CT on 02/02/2018 also revealed similar findings.   Past Medical History:  Diagnosis Date   Acute superficial venous thrombosis of lower extremity    a. RLE after CABG, neg dopp for DVT.   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 S/P stenting to mid RCA, prox PDA 06/1999. b. NSTEMI/CABG x 3 in 10/2011 with LIMA to LAD, SVG to PDA, and SVG to OM1.    Cephalalgia 07/14/2015   CHB (complete heart block) 05/03/2012   Overview:  Status post complete heart block heart rate 28 bpm, alternating with 2 to one AV block and drug for bradycardia.  Status post pacemaker implantation.status post Medtronic pacemaker implantation the 03/28/2012   Chronic diastolic CHF (congestive heart failure) 02/05/2018   Chronic gouty arthritis    Chronic UTI    a. Followed by Dr. Risa Grill - colonized/asymptomatic - not on abx   CKD (chronic kidney disease)    stage 3, GFR 30-59 ml/min; stable with a creatinine around 1.9-2.0  followed by nephrology.   Constipation 03/01/2015   Symptoms and exam consistent with constipation.  Abdominal exam is benign with no evidence of pain or obstruction. Discussed importance of increasing fiber and water intake coupled with physical activity to assist with bowel movements. Continue over-the-counter medication management as needed. Follow-up if symptoms worsen or fail to improve.   Coronary atherosclerosis 11/27/2011   Overview:  Multivessel coronary artery disease recently diagnosed by catheterization 2013 History of stent placement with a heparin-coated stent 2001  Last Assessment & Plan:  Status post coronary bypass grafting.  No recurrent chest pain. Overview:  Overview:  S/P stenting to mid RCA, prox PDA 06/1999;  06/2007 Myoview: negative except for apical thinning, EF 68%, last Myoview August 10, 2008 with n   Degeneration of lumbar intervertebral disc    Degenerative disc disease, cervical, with radiculopathy 46/27/0350   Diastolic dysfunction 09/38/1829   Grade 1 DD on Echo 06/2017   Dyslipidemia    Dysphagia 02/06/2016   3/18 - DG Esophagus:  1. Mild esophageal dysmotility, likely presbyesophagus. 2. No other explanation for patient's symptoms. 3. Small hiatal hernia.  On swallow eval - evidence of cervical spine disease and that was likely contributing   Echocardiogram    Echocardiogram 08/2018: EF 50-55, inf-lat AK, mild LVH, Gr 1 DD, RVSP 47, mild LAE, mild to mod MR, mild AI, mild AS (mean 11)   Epistaxis 12/08/2020   Essential hypertension 07/20/2006   Fatigue 05/18/2017   GERD (gastroesophageal reflux disease)    Gout 05/12/2018   Gouty arthritis of right great toe 09/03/2017   Left toe Injected January 31, 2018    Hyperpigmentation 11/04/2017   Internal hemorrhoids 08/15/2016   Left inguinal hernia 07/01/2019   Lumbar post-laminectomy syndrome 12/12/2017   Moderate aortic regurgitation 07/04/2017   Echo 06/2017:  EF 55-60%, mild LVH, grade 1 DD, mild AS, mod AR, mild  MR, mild-mod TR   Myocardial infarction    Neck injury    a. C3-C4 and C4-C5 foraminal narrowing, severe   Numbness of left foot 10/21/2019   Pacemaker 04/10/2012   Medtronic pacemaker  Last Assessment & Plan:  Status post pacemaker implantation for symptomatic bradycardia.  Pacemaker site is slightly tender and erythematous.  A prescription of Keflex was dispensed.  I also asked the patient to closely followup with the EP service regarding his pacemaker placement. 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  This was switched to by mouth amiodarone at the time of the thoracic surgery appointment was discontinued.   Presence of aortocoronary bypass graft 10/24/2011   Overview:  Performed at [facility] 2013 Last Assessment & Plan:  No complication post coronary bypass grafting.   Prostate cancer ([place])    a. 2001 s/p TURP.   Pulmonary nodule    a. felt to be noncancerous.  Status post followup CT scan 4 mm and stable.   Renal artery stenosis    a. 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  Trochanteric bursitis of hip, bilateral 01/10/2017   UGIB (upper gastrointestinal bleed) 02/01/2018   EGD  02/06/18 - mod, non-erosive gastritis   Venous insufficiency of leg 06/05/2010   Vitamin B12 deficiency 08/23/2009   Jan '14  July '14 B12 level  >1500    472   Weakness of  both lower extremities 04/09/2020    Past Surgical History:  Procedure Laterality Date   ANTERIOR CHAMBER WASHOUT Left 10/04/2018   Procedure: Anterior Chamber Washout, Vitreous Tap;  Surgeon: Jalene Mullet, MD;  Location: [place];  Service: Ophthalmology;  Laterality: Left;   cardia catherization  07-07-99   CARDIAC SURGERY  10/18/12   open heart surgery   CATARACT EXTRACTION W/ INTRAOCULAR LENS  IMPLANT, BILATERAL  3/205, 06/2013   [facility]   COLONOSCOPY  04/12/07   CORONARY ARTERY BYPASS GRAFT  10/19/2011   Procedure: CORONARY ARTERY BYPASS GRAFTING (CABG);  Surgeon: Gaye Pollack, MD;  Location: [place];  Service: Open Heart Surgery;  Laterality: N/A;  times three using Left Internal Mammary Artery and Right Greater Saphenouse Vein Graft harvested Endoscopically   edg  07-17-1994   FLEXIBLE SIGMOIDOSCOPY  11-03-1997   GAS INSERTION Left 10/04/2018   Procedure: Insertion Of Gas;  Surgeon: Jalene Mullet, MD;  Location: [place];  Service: Ophthalmology;  Laterality: Left;   GAS/FLUID EXCHANGE Left 10/04/2018   Procedure: Gas/Fluid Exchange;  Surgeon: Jalene Mullet, MD;  Location: [place];  Service: Ophthalmology;  Laterality: Left;   LEFT HEART CATHETERIZATION WITH CORONARY ANGIOGRAM N/A 10/16/2011   Procedure: LEFT HEART CATHETERIZATION WITH CORONARY ANGIOGRAM;  Surgeon: Peter M Martinique, MD;  Location: [facility] CATH LAB;  Service: Cardiovascular;  Laterality: N/A;   LEFT HEART CATHETERIZATION WITH CORONARY/GRAFT ANGIOGRAM N/A 03/11/2013   Procedure: LEFT HEART CATHETERIZATION WITH [name];  Surgeon: Blane Ohara, MD;  Location: [facility] CATH LAB;  Service: Cardiovascular;  Laterality: N/A;   lumbar spinal disk and neck fusion surgery     PACEMAKER INSERTION  03/28/12   PPM implanted for mobitz II AV block   PARS PLANA VITRECTOMY Left 10/04/2018   Procedure: PARS PLANA VITRECTOMY 25 GAUGE FOR ENDOPHTHALMITIS WITH INJECTION OF INTRAVITREAL ANTIBIOTIC;  Surgeon: Jalene Mullet, MD;  Location: [place];  Service: Ophthalmology;  Laterality: Left;   peripheral vascular catherization  11-24-03   PERMANENT PACEMAKER INSERTION N/A 03/28/2012   Procedure: PERMANENT PACEMAKER INSERTION;  Surgeon: Thompson Grayer, MD;  Location: [facility] CATH LAB;  Service: Cardiovascular;  Laterality: N/A;   PROSTATECTOMY     renal circulation  10-01-03   s/p ptca     stents     X 2   stress cardiolite  05-04-05   spring 09-negative except for apical thinning, EF 68%    Current Outpatient Medications:    acetaminophen (TYLENOL) 500 MG tablet, Take 500 mg by mouth every 6 (six) hours as needed., Disp: , Rfl:    allopurinol (ZYLOPRIM) 100 MG tablet, TAKE 1 TABLET(100 MG) BY MOUTH DAILY, Disp: 90 tablet, Rfl: 1   amLODipine (NORVASC) 5 MG tablet, TAKE 1 TABLET DAILY, Disp: 90 tablet, Rfl: 3   Aromatic Inhalants (VICKS VAPOR INHALER IN), Place 1 puff into both nostrils as needed (for congestion)., Disp: , Rfl:    Ascorbic Acid (VITAMIN C) 500 MG CAPS, 3 CAPS (Patient not taking: Reported on 12/19/2020), Disp: , Rfl:    aspirin EC 81 MG tablet, Take 81 mg by mouth daily., Disp: , Rfl:    Calcium Citrate-Vitamin D (CITRACAL + D PO), Take 2  tablets by mouth in the morning and at bedtime., Disp: , Rfl:    Carboxymeth-Glycerin-Polysorb (REFRESH OPTIVE MEGA-3 OP), Place 1 drop into both eyes 2 (two) times daily., Disp: , Rfl:    clobetasol cream (TEMOVATE) 0.05 %, Apply topically 2 (two) times daily., Disp: , Rfl:    Cyanocobalamin (VITAMIN B12) 1000 MCG TBCR, 1 tablet, Disp: , Rfl:    epoetin alfa-epbx (RETACRIT) 2000 UNIT/ML injection, 2,000 Units every 14 (fourteen) days. Patient states he only gets it when he needs it but not every 2 weeks, Disp: , Rfl:    folic acid (FOLVITE) 1 MG tablet, Take 1 tablet (1 mg total) by mouth daily. Annual appt due in May must see provider for future refills, Disp: 90 tablet, Rfl: 0   furosemide (LASIX) 40 MG tablet, Take 1 tablet (40 mg total) by mouth daily as needed for edema., Disp: 30  tablet, Rfl: 0   loratadine (CLARITIN) 10 MG tablet, Take 10 mg by mouth daily as needed (for seasonal allergies)., Disp: , Rfl:    Multiple Vitamins-Minerals (PRESERVISION AREDS 2 PO), Take by mouth., Disp: , Rfl:    nitroGLYCERIN (NITROSTAT) 0.4 MG SL tablet, DISSOLVE 1 TABLET UNDER THE TONGUE EVERY 5 MINUTES AS NEEDED FOR CHEST PAIN, MAXIMUM 3 TABLETS, Disp: 100 tablet, Rfl: 1   Saline (AYR NASAL MIST ALLERGY/SINUS NA), Place into the nose., Disp: , Rfl:    traMADol (ULTRAM) 50 MG tablet, Take 50 mg by mouth 3 (three) times daily as needed., Disp: , Rfl:    vitamin C (ASCORBIC ACID) 500 MG tablet, Take 1,000 mg by mouth daily. , Disp: , Rfl:    Vitamin D, Ergocalciferol, (DRISDOL) 1.25 MG (50000 UT) CAPS capsule, Take 50,000 Units by mouth every 30 (thirty) days. , Disp: , Rfl:   Current Facility-Administered Medications:    NON FORMULARY 1 application, 1 application, Topical, PRN, Landis Martins, DPM  Clinical Interview:   The following information was obtained during a clinical interview with Mr. Gloria and his wife prior to cognitive testing.  Cognitive Symptoms: Decreased short-term memory: Endorsed. Primary difficulties surrounded him losing his train of thought and "going blank" while in the middle of a sentence. He also described times where he may enter a room and initially forget his original intention. He generally denied significant difficulties recalling past conversations or names of familiar individuals.  Decreased long-term memory: Denied. Decreased attention/concentration: Endorsed. However, difficulties were said to "come and go" rather than represent a consistent concern.  Reduced processing speed: Unclear.  Difficulties with executive functions: Endorsed. He primarily reported trouble with organization. He noted that he will commonly start projects, get distracted, and not always come back to his original endeavor. Because of this, things are accomplished at a slower  rate, which does seem to increase levels of frustration.  Difficulties with emotion regulation: Denied. Difficulties with receptive language: Denied. Difficulties with word finding: Denied. Decreased visuoperceptual ability: Endorsed. Deficits in depth perception were related to vision changes due to macular degeneration in his left eye.   Trajectory of deficits: Mr. Thune received an Eligard injection in November 2021 as part of ongoing treatment for prostate cancer. He and his wife noted a change in memory and other cognitive abilities shortly after him receiving this injection. These difficulties have persisted since that time. Prior to this, Mr. Swails denied significant memory dysfunction or cognitive concerns. His wife was in agreement with this.   Difficulties completing ADLs: Denied. He lives with his wife and continues to  manage medications and personal finances without reported difficulty. He no longer drives due to vision changes stemming from macular degeneration.   Additional Medical History: History of traumatic brain injury/concussion: Denied. History of stroke: Denied. History of seizure activity: Denied. History of known exposure to toxins: Denied. Symptoms of chronic pain: Endorsed. He reported arthritic pain primarily in his hands bilaterally. He also reported experiencing "constant electricity" in his foot up through his hips bilaterally. The ankle to foot region was said to exhibit more frequent and significant symptoms.  Experience of frequent headaches/migraines: Denied. Frequent instances of dizziness/vertigo: Denied.  Sensory changes: He wears glasses with some benefit. As stated above, vision is impacted by macular degeneration in his left eye. He reported a diminished sense of taste ("things don't always taste like I think they should"). He was unsure of diminished smell, stating that this seems impacted since his ENT inserted a "gel" like substance in his nose  to help with an unspecified problem. Hearing related difficulties were denied.  Balance/coordination difficulties: Endorsed. He described his balance as "lousy" and medical records suggest symptoms of neuropathy and weakness in his lower extremities bilaterally. He ambulates with a cane and commonly uses furniture or other things in his environment to help with stabilization. He is also very careful while moving. He denied any recent falls.  Other motor difficulties: He denied ongoing tremors or other involuntary movements. He did report that fine motor abilities are diminished (e.g., cannot pick up small objects with his fingers), assumedly due to arthritic changes in his hands.   Sleep History: Estimated hours obtained each night: 2-4 hours.  Difficulties falling asleep: Endorsed. Difficulties staying asleep: Endorsed. He reported commonly waking to use the restroom throughout the night. He will then have difficulty falling back asleep and feels "wide awake."  Feels rested and refreshed upon awakening: Denied. He reported commonly waking feeling fatigued. He did report that if he sits and stays still for a while, he can quickly fall asleep for a brief nap.   History of snoring: Denied. History of waking up gasping for air: Endorsed. He reported this occurring "sometimes" but denied any physician expressing concerns surrounding sleep apnea in the past.  Witnessed breath cessation while asleep: Denied.  History of vivid dreaming: Denied. Excessive movement while asleep: Denied. Instances of acting out his dreams: Denied.  Psychiatric/Behavioral Health History: Depression: He endorsed mild symptoms of frustration due to him being unable to do as much as he previously could. He generally denied prior depressive experiences or diagnosed mental health conditions. Current or remote suicidal ideation, intent, or plan was denied.  Anxiety: Denied. Mania: Denied. Trauma History:  Denied. Visual/auditory hallucinations: Unclear. He described instances where he may look up and feel like he sees an individual "crossing the room." These did not appear to represent fully-formed hallucinations based on his description. He also described instances where he feels he can hear an individual walking behind him when nobody is present.  Delusional thoughts: Denied.  Tobacco: Denied. Alcohol: He denied current alcohol consumption as well as a history of problematic alcohol abuse or dependence.  Recreational drugs: Denied.  Family History: Problem Relation Age of Onset   Coronary artery disease Father        died @ 85   Other Mother        cerebral hemorrhage - died @ 74   Arthritis Mother    Cancer Brother        Bladder   Prostate cancer Brother  Nephritis Brother        died @ age 6.   Other Brother        cerebral hemorrhage - died @ 21   Heart disease Brother    Breast cancer Other        niece x 2   Diabetes Neg Hx    Colon cancer Neg Hx    Adrenal disorder Neg Hx    This information was confirmed by Mr. Muchmore.  Academic/Vocational History: Highest level of educational attainment: 13 years. He graduated from high school and completed one additional year of college. He described himself as a good (A/B) student in academic settings. Lavena Stanford potentially represented a relative weakness as he reported having to attend summer school following the 11th grade to make up for poor performance in this subject.  History of developmental delay: Denied. History of grade repetition: Denied. Enrollment in special education courses: Denied. History of LD/ADHD: Denied.  Employment: Retired. He served in the [employer] for 20 years. 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   Evaluation Results:   Behavioral Observations: Mr. Basso was accompanied by his wife, arrived to his appointment  on time, and was appropriately dressed and groomed. He appeared alert and oriented. He ambulated slowly and utilized a cane for added stability. Frank balance instability was not observed. He did have mild trouble arising from his seat on one occasion. Gross motor functioning appeared intact upon informal observation and no abnormal movements (e.g., tremors) were noted. His affect was generally relaxed and positive. Spontaneous speech was fluent and word finding difficulties were not observed during the clinical interview. Thought processes were coherent, organized, and normal in content. Insight into his cognitive difficulties appeared adequate.   During testing, sustained attention was appropriate. Task engagement was adequate and he persisted when challenged. One task (D-KEFS Color Word) was not attempted due to reported color blindness. Another task (TMT B) was impacted by vision difficulties due to macular degeneration. Overall, Mr. Mergen was cooperative with the clinical interview and subsequent testing procedures.   Adequacy of Effort: The validity of neuropsychological testing is limited by the extent to which the individual being tested may be assumed to have exerted adequate effort during testing. Mr. Hellard expressed his intention to perform to the best of his abilities and exhibited adequate task engagement and persistence. Performance on a stand-alone performance validity task was within expectation. As such, the results of the current evaluation are believed to be a valid representation of Mr. Wolke' current cognitive functioning.  Test Results: Mr. Adamczak was fully oriented at the time of the current evaluation.  Intellectual abilities based upon educational and vocational attainment were estimated to be in the average range. Premorbid abilities were estimated to be within the average range based upon a single-word reading test.   Processing speed was average. Basic  attention was average. More complex attention (e.g., working memory) was also average. Executive functioning was average. He performed in the above average range on a task assessing safety and judgment.  Assessed receptive language abilities were well below average. However, this likely represents a normative weakness rather than a functional/clinical weakness as he only missed two items across the entirety of this task. Mr. Paprocki did not exhibit any difficulties comprehending task instructions and answered all questions asked of him appropriately. Assessed expressive language was mildly variable. Verbal fluency was average. Confrontation naming was well below average on  a screening instrument but average across a more comprehensive task.     Assessed visuospatial/visuoconstructional abilities were average to well above average.    Learning (i.e., encoding) of novel verbal and visual information was average. Spontaneous delayed recall (i.e., retrieval) of previously learned information was well below average to below average across a list learning task but average across story and shape tasks. Retention rates were 63% across a story learning task, 57% across a list learning task, and 100% across a shape learning task.    Results of emotional screening instruments suggested that recent symptoms of generalized anxiety were in the mild range, while symptoms of depression were within normal limits. A screening instrument assessing recent sleep quality suggested the presence of moderate sleep dysfunction.  Tables of Scores:   Note: This summary of test scores accompanies the interpretive report and should not be considered in isolation without reference to the appropriate sections in the text. Descriptors are based on appropriate normative data and may be adjusted based on clinical judgment. Terms such as "Within Normal Limits" and "Outside Normal Limits" are used when a more specific description of the  test score cannot be determined.       Percentile - Normative Descriptor > 98 - Exceptionally High 91-97 - Well Above Average 75-90 - Above Average 25-74 - Average 9-24 - Below Average 2-8 - Well Below Average < 2 - Exceptionally Low       Validity:   DESCRIPTOR       Dot Counting Test: --- --- Within Normal Limits       Cognitive Screening:      Raw Score Percentile   SLUMS: 20/30 --- ---       NAB Screening Battery, Form 1 Standard Score/ T Score Percentile   Total Score 100 50 Average    Orientation 29/29 --- ---  Attention Domain 93 32 Average    Digits Forward 45 31 Average    Digits Backwards 50 50 Average    Letters & Numbers A Efficiency 49 46 Average    Letters & Numbers B Efficiency 44 27 Average  Language Domain 34 6 Well Below Average    Auditory Comprehension 35 7 Well Below Average    Naming 36 8 Well Below Average  Memory Domain 102 55 Average    Shape Learning Immediate Recognition 51 54 Average    Story Learning Immediate Recall 55 69 Average    Shape Learning Delayed Recognition 53 62 Average    Story Learning Delayed Recall 45 31 Average  Spatial Domain 132 98 Exceptionally High    Visual Discrimination 61 86 Above Average    Design Construction 67 62 Well Above Average  Executive Functions Domain 98 45 Average    Mazes 51 54 Average    Word Generation 47 38 Average       Intellectual Functioning:      Standard Score Percentile   Test of Premorbid Functioning: 97 42 Average       Memory:     Engineer, drilling Test (CVLT-III) Brief Form: Raw Score (Scaled/Standard Score) Percentile     Total Trials 1-4 20/36 (92) 30 Average    Short-Delay Free Recall 5/9 (7) 16 Below Average    Long-Delay Free Recall 4/9 (7) 16 Below Average    Long-Delay Cued Recall 4/9 (4) 2 Well Below Average    Recognition Hits 6/9 (7) 16 Below Average    False Positive Errors 2 (8) 25 Average  Attention/Executive Function:     Trail Making Test (TMT):  Raw Score (Scaled Score) Percentile     Part A 44 secs.,  0 errors (10) 50 Average    Part B 125 secs.,  3 errors (10) 50 Average  *Based on [employer] Older Normative Studies (MOANS)          NAB Executive Functions Module, Form 1: T Score Percentile     Judgment 58 79 Above Average       Language:     Verbal Fluency Test: Raw Score (Scaled Score) Percentile     Phonemic Fluency (CFL) 34 (10) 50 Average    Category Fluency 31 (10) 50 Average  *Based on [employer] Older Normative Studies (MOANS)          NAB Language Module, Form 1: T Score Percentile     Naming 28/31 (45) 31 Average       Visuospatial/Visuoconstruction:      Raw Score Percentile   Clock Drawing: 9/10 --- Within Normal Limits        Scaled Score Percentile   WAIS-IV Matrix Reasoning: 8 25 Average       Mood and Personality:      Raw Score Percentile   Geriatric Depression Scale: 7 --- Within Normal Limits  Geriatric Anxiety Scale: 14 --- Mild    Somatic 10 --- Moderate    Cognitive 2 --- Minimal    Affective 2 --- Minimal       Additional Questionnaires:      Raw Score Percentile   PROMIS Sleep Disturbance Questionnaire: 30 --- Moderate   Informed Consent and Coding/Compliance:   The current evaluation represents a clinical evaluation for the purposes previously outlined by the referral source and is in no way reflective of a forensic evaluation.   Mr. Lofstrom was provided with a verbal description of the nature and purpose of the present neuropsychological evaluation. Also reviewed were the foreseeable risks and/or discomforts and benefits of the procedure, limits of confidentiality, and mandatory reporting requirements of this provider. The patient was given the opportunity to ask questions and receive answers about the evaluation. Oral consent to participate was provided by the patient.   This evaluation was conducted by Christia Reading, Ph.D., ABPP-CN, board certified clinical neuropsychologist. Mr.  Braithwaite completed a clinical interview with Dr. Melvyn Novas, billed as one unit [phone], and 120 minutes of cognitive testing and scoring, billed as one unit [phone] and three additional units 96139. Psychometrist Milana Kidney, B.S., assisted Dr. Melvyn Novas with test administration and scoring procedures. As a separate and discrete service, Dr. Melvyn Novas spent a total of 160 minutes in interpretation and report writing billed as one unit [phone] and two units 96133.

## 2021-01-05 NOTE — Progress Notes (Signed)
° °  Psychometrician Note   Cognitive testing was administered to [employer] by Milana Kidney, B.S. (psychometrist) under the supervision of Dr. Christia Reading, Ph.D., licensed psychologist on 01/05/21. Mr. Uptain did not appear overtly distressed by the testing session per behavioral observation or responses across self-report questionnaires. Rest breaks were offered.    The battery of tests administered was selected by Dr. Christia Reading, Ph.D. with consideration to Mr. Mashaw's current level of functioning, the nature of his symptoms, emotional and behavioral responses during interview, level of literacy, observed level of motivation/effort, and the nature of the referral question. This battery was communicated to the psychometrist. Communication between Dr. Christia Reading, Ph.D. and the psychometrist was ongoing throughout the evaluation and Dr. Christia Reading, Ph.D. was immediately accessible at all times. Dr. Christia Reading, Ph.D. provided supervision to the psychometrist on the date of this service to the extent necessary to assure the quality of all services provided.    SHAMIR TUZZOLINO will return within approximately 1-2 weeks for an interactive feedback session with Dr. Melvyn Novas at which time his test performances, clinical impressions, and treatment recommendations will be reviewed in detail. Mr. Sangha understands he can contact our office should he require our assistance before this time.  A total of 120 minutes of billable time were spent face-to-face with Mr. Math by the psychometrist. This includes both test administration and scoring time. Billing for these services is reflected in the clinical report generated by Dr. Christia Reading, Ph.D.  This note reflects time spent with the psychometrician and does not include test scores or any clinical interpretations made by Dr. Melvyn Novas. The full report will follow in a separate note.

## 2021-01-06 ENCOUNTER — Other Ambulatory Visit: Payer: Self-pay

## 2021-01-06 ENCOUNTER — Ambulatory Visit (HOSPITAL_COMMUNITY): Payer: Medicare Other | Attending: Cardiovascular Disease

## 2021-01-06 DIAGNOSIS — I350 Nonrheumatic aortic (valve) stenosis: Secondary | ICD-10-CM | POA: Insufficient documentation

## 2021-01-06 LAB — ECHOCARDIOGRAM COMPLETE
AR max vel: 1.01 cm2
AV Area VTI: 1.07 cm2
AV Area mean vel: 0.92 cm2
AV Mean grad: 17.3 mmHg
AV Peak grad: 29.7 mmHg
Ao pk vel: 2.72 m/s
Area-P 1/2: 3.61 cm2
P 1/2 time: 652 msec
S' Lateral: 3.5 cm

## 2021-01-06 NOTE — Telephone Encounter (Signed)
Patient checking status of refill request  Patient states he is out of medication  Advised patient request may take up to 3bds

## 2021-01-08 ENCOUNTER — Encounter (HOSPITAL_COMMUNITY): Payer: Self-pay

## 2021-01-10 ENCOUNTER — Other Ambulatory Visit: Payer: Self-pay

## 2021-01-10 ENCOUNTER — Encounter (HOSPITAL_COMMUNITY)
Admission: RE | Admit: 2021-01-10 | Discharge: 2021-01-10 | Disposition: A | Discharge status: Home / Self Care | Payer: Medicare Other | Source: Ambulatory Visit | Attending: Nephrology | Admitting: Nephrology

## 2021-01-10 ENCOUNTER — Ambulatory Visit (INDEPENDENT_AMBULATORY_CARE_PROVIDER_SITE_OTHER): Payer: Medicare Other

## 2021-01-10 VITALS — BP 122/69 | HR 73 | Temp 97.2°F | Resp 18

## 2021-01-10 DIAGNOSIS — N183 Chronic kidney disease, stage 3 unspecified: Secondary | ICD-10-CM | POA: Insufficient documentation

## 2021-01-10 DIAGNOSIS — I5032 Chronic diastolic (congestive) heart failure: Secondary | ICD-10-CM

## 2021-01-10 DIAGNOSIS — D631 Anemia in chronic kidney disease: Secondary | ICD-10-CM | POA: Diagnosis not present

## 2021-01-10 LAB — CUP PACEART REMOTE DEVICE CHECK
Battery Impedance: 937 Ohm
Battery Remaining Longevity: 70 mo
Battery Voltage: 2.77 V
Brady Statistic AP VP Percent: 0 %
Brady Statistic AP VS Percent: 21 %
Brady Statistic AS VP Percent: 1 %
Brady Statistic AS VS Percent: 78 %
Date Time Interrogation Session: 20230103091152
Implantable Lead Implant Date: 20140321
Implantable Lead Implant Date: 20140321
Implantable Lead Location: 753859
Implantable Lead Location: 753860
Implantable Lead Model: 5076
Implantable Lead Model: 5092
Implantable Pulse Generator Implant Date: 20140321
Lead Channel Impedance Value: 417 Ohm
Lead Channel Impedance Value: 741 Ohm
Lead Channel Pacing Threshold Amplitude: 0.75 V
Lead Channel Pacing Threshold Amplitude: 1 V
Lead Channel Pacing Threshold Pulse Width: 0.4 ms
Lead Channel Pacing Threshold Pulse Width: 0.4 ms
Lead Channel Setting Pacing Amplitude: 2 V
Lead Channel Setting Pacing Amplitude: 2.5 V
Lead Channel Setting Pacing Pulse Width: 0.4 ms
Lead Channel Setting Sensing Sensitivity: 5.6 mV

## 2021-01-10 LAB — IRON AND TIBC
Iron: 72 ug/dL (ref 45–182)
Saturation Ratios: 24 % (ref 17.9–39.5)
TIBC: 304 ug/dL (ref 250–450)
UIBC: 232 ug/dL

## 2021-01-10 LAB — FERRITIN: Ferritin: 139 ng/mL (ref 24–336)

## 2021-01-10 LAB — POCT HEMOGLOBIN-HEMACUE: Hemoglobin: 11.5 g/dL — ABNORMAL LOW (ref 13.0–17.0)

## 2021-01-10 MED ORDER — EPOETIN ALFA-EPBX 10000 UNIT/ML IJ SOLN
INTRAMUSCULAR | Status: AC
  Administered 2021-01-10: 20000 [IU] via SUBCUTANEOUS
  Filled 2021-01-10: qty 2

## 2021-01-10 MED ORDER — EPOETIN ALFA-EPBX 10000 UNIT/ML IJ SOLN: 20000.0000 [IU] | INTRAMUSCULAR | Status: DC

## 2021-01-11 ENCOUNTER — Encounter: Payer: Medicare Other | Admitting: Psychology

## 2021-01-16 ENCOUNTER — Other Ambulatory Visit: Payer: Self-pay

## 2021-01-16 ENCOUNTER — Ambulatory Visit (INDEPENDENT_AMBULATORY_CARE_PROVIDER_SITE_OTHER): Payer: Medicare Other | Admitting: Psychology

## 2021-01-16 ENCOUNTER — Telehealth: Payer: Self-pay

## 2021-01-16 DIAGNOSIS — R413 Other amnesia: Secondary | ICD-10-CM | POA: Diagnosis not present

## 2021-01-16 NOTE — Progress Notes (Signed)
° °  Neuropsychology Feedback Session Tillie Rung. [facility]  Reason for Referral:   [name] is a 88 y.o. right-handed Caucasian male referred by  Billey Gosling, M.D. , to characterize his current cognitive functioning and assist with diagnostic clarity and treatment planning in the context of subjective cognitive decline.   Feedback:   [name] completed a comprehensive neuropsychological evaluation on 01/05/2021. Please refer to that encounter for the full report and recommendations. Briefly, results suggested neuropsychological functioning largely within normal limits relative to age-matched peers. He did exhibit some trouble learning and later recalling a list of words. However, his performance was compared to individuals younger than himself (i.e., 63-34 years old) on this task and performances across other verbal and visual memory tasks were strong. A weakness was also exhibited across a task assessing receptive language. However, this likely represents a normative weakness rather than a functional/clinical weakness as he only missed two items across the entirety of this task. Performance variability was also noted across confrontation naming; however, his performance was much improved across a more comprehensive and sensitive task relative to lower performance on a brief screening task. Thus, I do not believe there is enough to warrant an official diagnosis surrounding a mild cognitive disorder. Overall, I find it most likely that dysfunction is caused by his history of numerous cardiovascular medical ailments (e.g., hypertension, coronary artery disease, complete heart block, postoperative atrial fibrillation, moderate aortic regurgitation, congestive heart failure, and prior myocardial infarction) which are further exacerbated by both chronic sleep dysfunction and medication side effects. Continued medical monitoring will be important moving  forward.   [name] was accompanied by his wife during the current feedback session. Content of the current session focused on the results of his neuropsychological evaluation. [name] was given the opportunity to ask questions and his questions were answered. He was encouraged to reach out should additional questions arise. A copy of his report was provided at the conclusion of the visit.      25 minutes were spent conducting the current feedback session with [name], billed as one unit [phone].

## 2021-01-16 NOTE — Telephone Encounter (Signed)
Yes I agree - daily furosemide is ok with me. thanks

## 2021-01-16 NOTE — Telephone Encounter (Signed)
Pt advised.  Thanked nurse for follow up.

## 2021-01-16 NOTE — Telephone Encounter (Signed)
Call back received from Pt.  Per Pt he has had some shortness of breath getting into bed.  It resolves after he is in bed.  Per review of previous OV-Pt was to take lasix as needed.  It appears it was just changed to daily by his PCP.  Advised would send message to Dr. Burt Knack to see if he agrees with daily lasix.

## 2021-01-20 NOTE — Progress Notes (Signed)
Remote pacemaker transmission.   

## 2021-01-23 ENCOUNTER — Other Ambulatory Visit: Payer: Self-pay

## 2021-01-23 ENCOUNTER — Ambulatory Visit (INDEPENDENT_AMBULATORY_CARE_PROVIDER_SITE_OTHER): Payer: Medicare Other

## 2021-01-23 DIAGNOSIS — E538 Deficiency of other specified B group vitamins: Secondary | ICD-10-CM | POA: Diagnosis not present

## 2021-01-23 MED ORDER — CYANOCOBALAMIN 1000 MCG/ML IJ SOLN
1000.0000 ug | Freq: Once | INTRAMUSCULAR | Status: AC
  Administered 2021-01-23: 1000 ug via INTRAMUSCULAR

## 2021-01-23 NOTE — Progress Notes (Signed)
Subjective:    Patient ID: [name], male    DOB: January 19, 1926, 88 y.o.   MRN: [id number]  This visit occurred during the SARS-CoV-2 public health emergency.  Safety protocols were in place, including screening questions prior to the visit, additional usage of staff PPE, and extensive cleaning of exam room while observing appropriate contact time as indicated for disinfecting solutions.     HPI The patient is here for follow up of his recent neuropsych evaluation.   Ideally should get more sleep - the other night did not sleep at all - could not fall asleep.     BM crazy - has urgency at times.  Can get in there and not be able to go. BM sometimes minimal.  Stool tends to be hard.  ? Take stool softenr  Hernia incision - red on area with a white pimple - broke - getting twinges of pain on both side.   Had toes done - had massage and cream - no pain after.   Medications and allergies reviewed with patient and updated if appropriate.  Patient Active Problem List   Diagnosis Date Noted   Inflammation of operative incision 01/24/2021   Myocardial infarction 01/05/2021   Polyarthralgia    Chronic gouty arthritis    Degeneration of lumbar intervertebral disc    Epistaxis 12/08/2020   Pain in right foot 12/06/2020   Bilateral plantar fasciitis 10/25/2020   Weakness of both lower extremities 04/09/2020   Numbness of left foot 10/21/2019   S/P inguinal hernia repair 08/13/2019   Left inguinal hernia 07/01/2019   Gout 05/12/2018   Chronic diastolic CHF (congestive heart failure) 02/05/2018   UGIB (upper gastrointestinal bleed) 02/01/2018   Lumbar post-laminectomy syndrome 12/12/2017   Hyperpigmentation 11/04/2017   Spinal stenosis of lumbar region 10/09/2017   Frequent urination 09/03/2017   Gouty arthritis of right great toe 09/03/2017   Postnasal drip 08/06/2017   Moderate aortic regurgitation 62/83/6629   Diastolic dysfunction 47/65/4650   Edema 06/29/2017    SOB (shortness of breath) 06/29/2017   Sinus symptom 06/05/2017   Fatigue 05/18/2017   Trochanteric bursitis of hip, bilateral 01/10/2017   Internal hemorrhoids 08/15/2016   Dysphagia 02/06/2016   Cephalalgia 07/14/2015   Constipation 03/01/2015   CHB (complete heart block) 05/03/2012   Postoperative atrial fibrillation 05/03/2012   Pacemaker 04/10/2012   AV block, 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 03/26/2012   Coronary atherosclerosis 11/27/2011   Presence of aortocoronary bypass graft 10/24/2011   Pulmonary nodule    CAD (coronary artery disease)    CKD (chronic kidney disease) stage 3, GFR 30-59 ml/min    Venous insufficiency of leg 06/05/2010   Insomnia 11/09/2009   Pain in joint, shoulder region 10/14/2009   Vitamin B12 deficiency 08/23/2009   Peripheral neuropathy 03/21/2009   Degenerative disc disease, cervical, with radiculopathy 09/21/2008   Dyslipidemia 01/17/2007   Essential hypertension 07/20/2006   Prostate cancer 07/20/2006    Current Outpatient Medications on File Prior to Visit  Medication Sig Dispense Refill   acetaminophen (TYLENOL) 500 MG tablet Take 500 mg by mouth every 6 (six) hours as needed.     allopurinol (ZYLOPRIM) 100 MG tablet TAKE 1 TABLET(100 MG) BY MOUTH DAILY 90 tablet 1   amLODipine (NORVASC) 5 MG tablet TAKE 1 TABLET DAILY 90 tablet 3   Aromatic Inhalants (VICKS VAPOR INHALER IN) Place 1 puff into both nostrils as needed (for congestion).     aspirin EC 81 MG  tablet Take 81 mg by mouth daily.     Calcium Citrate-Vitamin D (CITRACAL + D PO) Take 2 tablets by mouth in the morning and at bedtime.     Carboxymeth-Glycerin-Polysorb (REFRESH OPTIVE MEGA-3 OP) Place 1 drop into both eyes 2 (two) times daily.     clobetasol cream (TEMOVATE) 0.05 % Apply topically 2 (two) times daily.     Cyanocobalamin (VITAMIN B12) 1000 MCG TBCR 1 tablet     epoetin alfa-epbx (RETACRIT) 2000 UNIT/ML injection 2,000 Units every 14 (fourteen) days. Patient states he  only gets it when he needs it but not every 2 weeks     folic acid (FOLVITE) 1 MG tablet Take 1 tablet (1 mg total) by mouth daily. Annual appt due in May must see provider for future refills 90 tablet 0   furosemide (LASIX) 40 MG tablet TAKE 1 TABLET BY MOUTH EVERY DAY 90 tablet 0   halobetasol (ULTRAVATE) 0.05 % cream Apply topically.     loratadine (CLARITIN) 10 MG tablet Take 10 mg by mouth daily as needed (for seasonal allergies).     Multiple Vitamins-Minerals (PRESERVISION AREDS 2 PO) Take by mouth.     nitroGLYCERIN (NITROSTAT) 0.4 MG SL tablet DISSOLVE 1 TABLET UNDER THE TONGUE EVERY 5 MINUTES AS NEEDED FOR CHEST PAIN, MAXIMUM 3 TABLETS 100 tablet 1   Saline (AYR NASAL MIST ALLERGY/SINUS NA) Place into the nose.     traMADol (ULTRAM) 50 MG tablet Take 50 mg by mouth 3 (three) times daily as needed.     vitamin C (ASCORBIC ACID) 500 MG tablet Take 1,000 mg by mouth daily.      Vitamin D, Ergocalciferol, (DRISDOL) 1.25 MG (50000 UT) CAPS capsule Take 50,000 Units by mouth every 30 (thirty) days.      Ascorbic Acid (VITAMIN C) 500 MG CAPS 3 CAPS (Patient not taking: Reported on 12/19/2020)     Current Facility-Administered Medications on File Prior to Visit  Medication Dose Route Frequency Provider Last Rate Last Admin   epoetin alfa-epbx (RETACRIT) injection 20,000 Units  20,000 Units Subcutaneous Q14 Days Edrick Oh, MD       NON FORMULARY 1 application  1 application Topical PRN Landis Martins, DPM        Past Medical History:  Diagnosis Date   Acute superficial venous thrombosis of lower extremity    a. RLE after CABG, neg dopp for DVT.   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 S/P stenting to mid RCA, prox PDA 06/1999. b. NSTEMI/CABG x 3 in 10/2011 with LIMA to LAD, SVG to PDA, and SVG to OM1.    Cephalalgia 07/14/2015   CHB (complete heart block) 05/03/2012    Overview:  Status post complete heart block heart rate 28 bpm, alternating with 2 to one AV block and drug for bradycardia.  Status post pacemaker implantation.status post Medtronic pacemaker implantation the 03/28/2012   Chronic diastolic CHF (congestive heart failure) 02/05/2018   Chronic gouty arthritis    Chronic UTI    a. Followed by Dr. Risa Grill - colonized/asymptomatic - not on abx   CKD (chronic kidney disease)    stage 3, GFR 30-59 ml/min; stable with a creatinine around 1.9-2.0 followed by nephrology.   Constipation 03/01/2015   Symptoms and exam consistent with constipation. Abdominal exam is benign with no evidence of pain or obstruction. Discussed  importance of increasing fiber and water intake coupled with physical activity to assist with bowel movements. Continue over-the-counter medication management as needed. Follow-up if symptoms worsen or fail to improve.   Coronary atherosclerosis 11/27/2011   Overview:  Multivessel coronary artery disease recently diagnosed by catheterization 2013 History of stent placement with a heparin-coated stent 2001  Last Assessment & Plan:  Status post coronary bypass grafting.  No recurrent chest pain. Overview:  Overview:  S/P stenting to mid RCA, prox PDA 06/1999;  06/2007 Myoview: negative except for apical thinning, EF 68%, last Myoview August 10, 2008 with n   Degeneration of lumbar intervertebral disc    Degenerative disc disease, cervical, with radiculopathy 25/42/7062   Diastolic dysfunction 37/62/8315   Grade 1 DD on Echo 06/2017   Dyslipidemia    Dysphagia 02/06/2016   3/18 - DG Esophagus:  1. Mild esophageal dysmotility, likely presbyesophagus. 2. No other explanation for patient's symptoms. 3. Small hiatal hernia.  On swallow eval - evidence of cervical spine disease and that was likely contributing   Echocardiogram    Echocardiogram 08/2018: EF 50-55, inf-lat AK, mild LVH, Gr 1 DD, RVSP 47, mild LAE, mild to mod MR, mild AI, mild AS (mean 11)    Epistaxis 12/08/2020   Essential hypertension 07/20/2006   Fatigue 05/18/2017   GERD (gastroesophageal reflux disease)    Gout 05/12/2018   Gouty arthritis of right great toe 09/03/2017   Left toe Injected January 31, 2018    Hyperpigmentation 11/04/2017   Internal hemorrhoids 08/15/2016   Left inguinal hernia 07/01/2019   Lumbar post-laminectomy syndrome 12/12/2017   Moderate aortic regurgitation 07/04/2017   Echo 06/2017:  EF 55-60%, mild LVH, grade 1 DD, mild AS, mod AR, mild MR, mild-mod TR   Myocardial infarction    Neck injury    a. C3-C4 and C4-C5 foraminal narrowing, severe   Numbness of left foot 10/21/2019   Pacemaker 04/10/2012   Medtronic pacemaker  Last Assessment & Plan:  Status post pacemaker implantation for symptomatic bradycardia.  Pacemaker site is slightly tender and erythematous.  A prescription of Keflex was dispensed.  I also asked the patient to closely followup with the EP service regarding his pacemaker placement. 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  This was switched to by mouth amiodarone at the time of the thoracic surgery appointment was discontinued.   Presence of aortocoronary bypass graft 10/24/2011   Overview:  Performed at [facility] 2013 Last Assessment & Plan:  No complication post coronary bypass grafting.   Prostate cancer ([place])    a. 2001 s/p TURP.   Pulmonary nodule    a. felt to be noncancerous.  Status post followup CT scan 4 mm and stable.   Renal  artery stenosis    a. 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   Trochanteric bursitis of hip, bilateral 01/10/2017   UGIB (upper  gastrointestinal bleed) 02/01/2018   EGD  02/06/18 - mod, non-erosive gastritis   Venous insufficiency of leg 06/05/2010   Vitamin B12 deficiency 08/23/2009   Jan '14  July '14 B12 level  >1500    472   Weakness of both lower extremities 04/09/2020    Past Surgical History:  Procedure Laterality Date   ANTERIOR CHAMBER WASHOUT Left 10/04/2018   Procedure: Anterior Chamber Washout, Vitreous Tap;  Surgeon: Jalene Mullet, MD;  Location: [place];  Service: Ophthalmology;  Laterality: Left;   cardia catherization  07-07-99   CARDIAC SURGERY  10/18/12   open heart surgery   CATARACT EXTRACTION W/ INTRAOCULAR LENS  IMPLANT, BILATERAL  3/205, 06/2013   [facility]   COLONOSCOPY  04/12/07   CORONARY ARTERY BYPASS GRAFT  10/19/2011   Procedure: CORONARY ARTERY BYPASS GRAFTING (CABG);  Surgeon: Gaye Pollack, MD;  Location:  [place];  Service: Open Heart Surgery;  Laterality: N/A;  times three using Left Internal Mammary Artery and Right Greater Saphenouse Vein Graft harvested Endoscopically   edg  07-17-1994   FLEXIBLE SIGMOIDOSCOPY  11-03-1997   GAS INSERTION Left 10/04/2018   Procedure: Insertion Of Gas;  Surgeon: Jalene Mullet, MD;  Location: [place];  Service: Ophthalmology;  Laterality: Left;   GAS/FLUID EXCHANGE Left 10/04/2018   Procedure: Gas/Fluid Exchange;  Surgeon: Jalene Mullet, MD;  Location: [place];  Service: Ophthalmology;  Laterality: Left;   LEFT HEART CATHETERIZATION WITH CORONARY ANGIOGRAM N/A 10/16/2011   Procedure: LEFT HEART CATHETERIZATION WITH CORONARY ANGIOGRAM;  Surgeon: Peter M Martinique, MD;  Location: [facility] CATH LAB;  Service: Cardiovascular;  Laterality: N/A;   LEFT HEART CATHETERIZATION WITH CORONARY/GRAFT ANGIOGRAM  N/A 03/11/2013   Procedure: LEFT HEART CATHETERIZATION WITH [name];  Surgeon: Blane Ohara, MD;  Location: [facility] CATH LAB;  Service: Cardiovascular;  Laterality: N/A;   lumbar spinal disk and neck fusion surgery     PACEMAKER INSERTION  03/28/12   PPM implanted for mobitz II AV block   PARS PLANA VITRECTOMY Left 10/04/2018   Procedure: PARS PLANA VITRECTOMY 25 GAUGE FOR ENDOPHTHALMITIS WITH INJECTION OF INTRAVITREAL ANTIBIOTIC;  Surgeon: Jalene Mullet, MD;  Location: [place];  Service: Ophthalmology;  Laterality: Left;   peripheral vascular catherization  11-24-03   PERMANENT PACEMAKER INSERTION N/A 03/28/2012   Procedure: PERMANENT PACEMAKER INSERTION;  Surgeon: Thompson Grayer, MD;  Location: [facility] LAB;  Service: Cardiovascular;  Laterality: N/A;   PROSTATECTOMY     renal circulation  10-01-03   s/p ptca     stents     X 2   stress cardiolite  05-04-05   spring 09-negative except for apical thinning, EF 68%    Social History   Socioeconomic History   Marital status: Married    Spouse name: [name]   Number of children: 2   Years of education: 13   Highest education level: Some college, no degree  Occupational History   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  married '52 - 3 years, divorced; married '56 - 3 years divorced; married '79-12 yrs - divorced; married '75 -. 1 son '57; 1 daughter - '53; 1 grandchild.  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  Very happily  married.  End of life care: yes CPR, no long term mechanical ventilation, no heroic measures.right handed   Right handed   Social Determinants of Health   Financial Resource Strain: Low Risk    Difficulty of Paying Living Expenses: Not hard at all  Food Insecurity: No Food Insecurity   Worried About Charity fundraiser in the Last Year: Never true   Ran Out of Food in the Last Year: Never true  Transportation Needs: No Transportation Needs   Lack of Transportation (Medical): No   Lack of Transportation (Non-Medical): No  Physical Activity: Sufficiently Active   Days of Exercise per Week: 5 days   Minutes of Exercise per Session: 30 min  Stress: No Stress Concern Present   Feeling of Stress : Not at all  Social Connections: Socially Integrated   Frequency of Communication with Friends and Family: More than three times a week   Frequency of Social Gatherings with Friends and Family: Once a week   Attends Religious Services: More than 4 times per year   Active Member of [employer] or Organizations: Yes   Attends Music therapist: More than 4 times per year   Marital Status: Married    Family History  Problem Relation Age of Onset   Coronary artery disease Father        died @ 22   Other Mother        cerebral hemorrhage - died @ 59   Arthritis Mother    Cancer Brother        Bladder   Prostate cancer Brother    Nephritis Brother        died @ age 62.   Other Brother        cerebral hemorrhage - died @ 20   Heart disease Brother    Breast cancer Other        niece x 2   Diabetes Neg Hx    Colon cancer Neg Hx    Adrenal disorder Neg Hx     Review of Systems  Constitutional:  Negative for chills and fever.  Skin:  Positive for color change.      Objective:   Vitals:   01/24/21 1525  BP: 128/70  Pulse: 89  Temp: 98.2 F (36.8 C)  SpO2: 99%   BP Readings from Last 3 Encounters:  01/24/21 128/70  01/24/21 130/74  01/10/21 122/69   Wt Readings from  Last 3 Encounters:  01/24/21 161 lb (73 kg)  12/19/20 167 lb 3.2 oz (75.8 kg)  12/08/20 161 lb 6.4 oz (73.2 kg)   Body mass index is 23.1 kg/m.   Physical Exam Constitutional:      General: He is not in acute distress.    Appearance: Normal appearance. He is not ill-appearing.  HENT:     Head: Normocephalic and atraumatic.  Skin:    General: Skin is warm and dry.     Comments: Left lower abdomen - well healed scar approximately 3 inches long - on the left side just inferior there is a small scar that is erythematous, slightly thickened, non-tender - non infected - likely scar tissue  Neurological:     Mental Status:  He is alert.  Psychiatric:        Mood and Affect: Mood normal.           Assessment & Plan:    I spent 20 minutes dedicated to the care of this patient on the date of this encounter including review of recent speciality notes, obtaining history, communicating with the patient, and documenting clinical information in the EHR   See Problem List for Assessment and Plan of chronic medical problems.

## 2021-01-23 NOTE — Progress Notes (Signed)
Pt was given B12 w/o any complications. 

## 2021-01-24 ENCOUNTER — Ambulatory Visit (INDEPENDENT_AMBULATORY_CARE_PROVIDER_SITE_OTHER): Payer: Medicare Other | Admitting: Internal Medicine

## 2021-01-24 ENCOUNTER — Encounter (HOSPITAL_COMMUNITY): Payer: Self-pay

## 2021-01-24 ENCOUNTER — Encounter: Payer: Self-pay | Admitting: Internal Medicine

## 2021-01-24 ENCOUNTER — Encounter (HOSPITAL_COMMUNITY)
Admission: RE | Admit: 2021-01-24 | Discharge: 2021-01-24 | Disposition: A | Discharge status: Home / Self Care | Payer: Medicare Other | Source: Ambulatory Visit | Attending: Nephrology | Admitting: Nephrology

## 2021-01-24 VITALS — BP 130/74 | HR 77 | Temp 97.9°F | Resp 19

## 2021-01-24 VITALS — BP 128/70 | HR 89 | Temp 98.2°F | Ht 70.0 in | Wt 161.0 lb

## 2021-01-24 DIAGNOSIS — T8149XA Infection following a procedure, other surgical site, initial encounter: Secondary | ICD-10-CM | POA: Diagnosis not present

## 2021-01-24 DIAGNOSIS — K5900 Constipation, unspecified: Secondary | ICD-10-CM

## 2021-01-24 DIAGNOSIS — G6289 Other specified polyneuropathies: Secondary | ICD-10-CM | POA: Diagnosis not present

## 2021-01-24 DIAGNOSIS — N183 Chronic kidney disease, stage 3 unspecified: Secondary | ICD-10-CM

## 2021-01-24 DIAGNOSIS — G4700 Insomnia, unspecified: Secondary | ICD-10-CM | POA: Diagnosis not present

## 2021-01-24 LAB — POCT HEMOGLOBIN-HEMACUE: Hemoglobin: 12.3 g/dL — ABNORMAL LOW (ref 13.0–17.0)

## 2021-01-24 MED ORDER — EPOETIN ALFA-EPBX 10000 UNIT/ML IJ SOLN: 20000.0000 [IU] | INTRAMUSCULAR | Status: DC

## 2021-01-24 NOTE — Patient Instructions (Addendum)
° ° ° °  For sleep you can try natural sleep aids.  Avoid anything with benadryl (diphenhydramine).  We can also try trazodone or Restoril ( that is what your wife is on)   Try a foot massage for your neuropathy.  Consider hemp creams.    Try the stool softener.

## 2021-01-24 NOTE — Assessment & Plan Note (Signed)
Acute Inguinal hernia surgery last year Incision healing well, except on left side just below incision there is an area of mild redness and it is not smooth - no infection - improved from what they saw recently  There is likely some scar tissue - monitor for now

## 2021-01-24 NOTE — Assessment & Plan Note (Signed)
Chronic Mostly bottom on feet Improved after recent massage/lotion Try foot massager Can try hemp

## 2021-01-24 NOTE — Assessment & Plan Note (Signed)
Chronic Has long standing difficulty sleeping Melatonin not effective Can try other otc meds, but avoid benadryl Can try trazodone  If above not effective can try Restoril

## 2021-01-24 NOTE — Assessment & Plan Note (Signed)
Chronic Has urgency and sometimes can not go Stool is hard Start stool softener daily and adjust dose depending on stool

## 2021-01-30 ENCOUNTER — Emergency Department (HOSPITAL_BASED_OUTPATIENT_CLINIC_OR_DEPARTMENT_OTHER)
Admission: EM | Admit: 2021-01-30 | Discharge: 2021-01-30 | Disposition: A | Discharge status: Home / Self Care | Payer: Medicare Other | Attending: Emergency Medicine | Admitting: Emergency Medicine

## 2021-01-30 ENCOUNTER — Other Ambulatory Visit: Payer: Self-pay

## 2021-01-30 ENCOUNTER — Encounter (HOSPITAL_BASED_OUTPATIENT_CLINIC_OR_DEPARTMENT_OTHER): Payer: Self-pay | Admitting: Emergency Medicine

## 2021-01-30 ENCOUNTER — Encounter (HOSPITAL_COMMUNITY): Payer: Self-pay

## 2021-01-30 DIAGNOSIS — M79604 Pain in right leg: Secondary | ICD-10-CM

## 2021-01-30 DIAGNOSIS — G8929 Other chronic pain: Secondary | ICD-10-CM | POA: Diagnosis not present

## 2021-01-30 DIAGNOSIS — Z91040 Latex allergy status: Secondary | ICD-10-CM | POA: Diagnosis not present

## 2021-01-30 DIAGNOSIS — M79651 Pain in right thigh: Secondary | ICD-10-CM | POA: Insufficient documentation

## 2021-01-30 DIAGNOSIS — R202 Paresthesia of skin: Secondary | ICD-10-CM | POA: Insufficient documentation

## 2021-01-30 DIAGNOSIS — N189 Chronic kidney disease, unspecified: Secondary | ICD-10-CM | POA: Diagnosis not present

## 2021-01-30 DIAGNOSIS — Z7982 Long term (current) use of aspirin: Secondary | ICD-10-CM | POA: Insufficient documentation

## 2021-01-30 DIAGNOSIS — Z79899 Other long term (current) drug therapy: Secondary | ICD-10-CM | POA: Insufficient documentation

## 2021-01-30 LAB — URINALYSIS, ROUTINE W REFLEX MICROSCOPIC
Bilirubin Urine: NEGATIVE
Glucose, UA: NEGATIVE mg/dL
Hgb urine dipstick: NEGATIVE
Ketones, ur: NEGATIVE mg/dL
Leukocytes,Ua: NEGATIVE
Nitrite: NEGATIVE
Protein, ur: NEGATIVE mg/dL
Specific Gravity, Urine: 1.009 (ref 1.005–1.030)
pH: 5 (ref 5.0–8.0)

## 2021-01-30 LAB — BASIC METABOLIC PANEL
Anion gap: 11 (ref 5–15)
BUN: 59 mg/dL — ABNORMAL HIGH (ref 8–23)
CO2: 25 mmol/L (ref 22–32)
Calcium: 10.3 mg/dL (ref 8.9–10.3)
Chloride: 101 mmol/L (ref 98–111)
Creatinine, Ser: 2.62 mg/dL — ABNORMAL HIGH (ref 0.61–1.24)
GFR, Estimated: 22 mL/min — ABNORMAL LOW (ref >60)
Glucose, Bld: 94 mg/dL (ref 70–99)
Potassium: 4 mmol/L (ref 3.5–5.1)
Sodium: 137 mmol/L (ref 135–145)

## 2021-01-30 LAB — CBC
HCT: 38.6 % — ABNORMAL LOW (ref 39.0–52.0)
Hemoglobin: 11.9 g/dL — ABNORMAL LOW (ref 13.0–17.0)
MCH: 24.8 pg — ABNORMAL LOW (ref 26.0–34.0)
MCHC: 30.8 g/dL (ref 30.0–36.0)
MCV: 80.4 fL (ref 80.0–100.0)
Platelets: 218 10*3/uL (ref 150–400)
RBC: 4.8 MIL/uL (ref 4.22–5.81)
RDW: 16.4 % — ABNORMAL HIGH (ref 11.5–15.5)
WBC: 5.9 10*3/uL (ref 4.0–10.5)
nRBC: 0 % (ref 0.0–0.2)

## 2021-01-30 NOTE — ED Provider Notes (Signed)
[facility] Provider Note   CSN: [id number] Arrival date & time: 01/30/21  1549     History  No chief complaint on file.   [name] is a 88 y.o. male.  88 year old male presents with chronic right thigh discomfort for several years.  [name] that he has been seen by many specialists and has had EMG studies and other evaluations without a cause.  [name] that the pain is sharp and located at the right medial thigh.  He has had some paresthesias in both legs.  No bowel or bladder dysfunction.  He is able to walk at this time but does use a cane which is his baseline.  Denies any fever.  No firmness to the right anterior thigh compartment.  Is not currently taking any medications for this.  Describes spasms that come and go.      Home Medications Prior to Admission medications   Medication Sig Start Date End Date Taking? Authorizing Provider  acetaminophen (TYLENOL) 500 MG tablet Take 500 mg by mouth every 6 (six) hours as needed.    [provider]  allopurinol (ZYLOPRIM) 100 MG tablet TAKE 1 TABLET(100 MG) BY MOUTH DAILY 12/26/20   [name], MD  amLODipine (NORVASC) 5 MG tablet TAKE 1 TABLET DAILY 12/26/20   [name], MD  Aromatic Inhalants (VICKS VAPOR INHALER IN) Place 1 puff into both nostrils as needed (for congestion).    [provider]  Ascorbic Acid (VITAMIN C) 500 MG CAPS 3 CAPS Patient not taking: Reported on 12/19/2020    [provider]  aspirin EC 81 MG tablet Take 81 mg by mouth daily.    [provider]  Calcium Citrate-Vitamin D (CITRACAL + D PO) Take 2 tablets by mouth in the morning and at bedtime.    [provider]  Carboxymeth-Glycerin-Polysorb (REFRESH OPTIVE MEGA-3 OP) Place 1 drop into both eyes 2 (two) times daily.    [provider]  clobetasol cream (TEMOVATE) 0.05 % Apply topically 2 (two) times daily. 08/04/20   [provider]  Cyanocobalamin  (VITAMIN B12) 1000 MCG TBCR 1 tablet    [provider]  epoetin alfa-epbx (RETACRIT) 2000 UNIT/ML injection 2,000 Units every 14 (fourteen) days. Patient [name] he only gets it when he needs it but not every 2 weeks    [provider]  folic acid (FOLVITE) 1 MG tablet Take 1 tablet (1 mg total) by mouth daily. Annual appt due in May must see provider for future refills 02/02/19   [name], MD  furosemide (LASIX) 40 MG tablet TAKE 1 TABLET BY MOUTH EVERY DAY 01/08/21   [name], MD  halobetasol (ULTRAVATE) 0.05 % cream Apply topically. 12/19/20   [provider]  loratadine (CLARITIN) 10 MG tablet Take 10 mg by mouth daily as needed (for seasonal allergies).    [provider]  Multiple Vitamins-Minerals (PRESERVISION AREDS 2 PO) Take by mouth.    [provider]  nitroGLYCERIN (NITROSTAT) 0.4 MG SL tablet DISSOLVE 1 TABLET UNDER THE TONGUE EVERY 5 MINUTES AS NEEDED FOR CHEST PAIN, MAXIMUM 3 TABLETS 04/30/19   Burns, Claudina Lick, MD  Saline (AYR NASAL MIST ALLERGY/SINUS NA) Place into the nose.    [provider]  traMADol (ULTRAM) 50 MG tablet Take 50 mg by mouth 3 (three) times daily as needed. 10/06/20   [provider]  vitamin C (ASCORBIC ACID) 500 MG tablet Take 1,000 mg by mouth daily.  [provider]  Vitamin D, Ergocalciferol, (DRISDOL) 1.25 MG (50000 UT) CAPS capsule Take 50,000 Units by mouth every 30 (thirty) days.     [provider]      Allergies    Aspirin, Lisinopril, Amoxicillin, Atarax [hydroxyzine hcl], Cephalexin, Ciprofloxacin, Clindamycin, Clobetasol, Codeine, Fish allergy, Fluarix [influenza virus vaccine], Haemophilus influenzae, Hydrocodone, Hydrocodone-acetaminophen, Hydroxyzine, Latex, Macrobid [nitrofurantoin macrocrystal], Niacin, Niacin-lovastatin er, Niacin-lovastatin er, Nitrofurantoin, Omeprazole, Other, Tramadol, Vibramycin [doxycycline], Adhesive [tape], Bactrim  [sulfamethoxazole-trimethoprim], Colchicine, Gabapentin, and Nortriptyline    Review of Systems   Review of Systems  All other systems reviewed and are negative.  Physical Exam Updated Vital Signs BP (!) 156/82 (BP Location: Right Arm)    Pulse 73    Temp 98.2 F (36.8 C)    Resp 16    Ht 1.803 m (5\' 11" )    Wt 73 kg    SpO2 100%    BMI 22.45 kg/m  Physical Exam Vitals and nursing note reviewed.  Constitutional:      General: He is not in acute distress.    Appearance: Normal appearance. He is well-developed. He is not toxic-appearing.  HENT:     Head: Normocephalic and atraumatic.  Eyes:     General: Lids are normal.     Conjunctiva/sclera: Conjunctivae normal.     Pupils: Pupils are equal, round, and reactive to light.  Neck:     Thyroid: No thyroid mass.     Trachea: No tracheal deviation.  Cardiovascular:     Rate and Rhythm: Normal rate and regular rhythm.     Heart sounds: Normal heart sounds. No murmur heard.   No gallop.  Pulmonary:     Effort: Pulmonary effort is normal. No respiratory distress.     Breath sounds: Normal breath sounds. No stridor. No decreased breath sounds, wheezing, rhonchi or rales.  Abdominal:     General: There is no distension.     Palpations: Abdomen is soft.     Tenderness: There is no abdominal tenderness. There is no rebound.  Musculoskeletal:        General: No tenderness. Normal range of motion.     Cervical back: Normal range of motion and neck supple.       Legs:  Skin:    General: Skin is warm and dry.     Findings: No abrasion or rash.  Neurological:     Mental Status: He is alert and oriented to person, place, and time. Mental status is at baseline.     GCS: GCS eye subscore is 4. GCS verbal subscore is 5. GCS motor subscore is 6.     Cranial Nerves: No cranial nerve deficit.     Sensory: No sensory deficit.     Motor: Motor function is intact.  Psychiatric:        Attention and Perception: Attention normal.        Speech:  Speech normal.        Behavior: Behavior normal.    ED Results / Procedures / Treatments   Labs (all labs ordered are listed, but only abnormal results are displayed) Labs Reviewed  BASIC METABOLIC PANEL - Abnormal; Notable for the following components:      Result Value   BUN 59 (*)    Creatinine, Ser 2.62 (*)    GFR, Estimated 22 (*)    All other components within normal limits  CBC - Abnormal; Notable for the following components:   Hemoglobin 11.9 (*)    HCT 38.6 (*)  MCH 24.8 (*)    RDW 16.4 (*)    All other components within normal limits  URINALYSIS, ROUTINE W REFLEX MICROSCOPIC - Abnormal; Notable for the following components:   Color, Urine COLORLESS (*)    All other components within normal limits    EKG None  Radiology No results found.  Procedures Procedures    Medications Ordered in ED Medications - No data to display  ED Course/ Medical Decision Making/ A&P                           Medical Decision Making Amount and/or Complexity of Data Reviewed Labs: ordered.   Patient's laboratories studies significant for worsening kidney function.  He does have history of chronic kidney disease.  Patient follows up with Dr. Justin Mend from nephrology and was instructed to follow-up with him for this.  Patient has multiple medical allergies with old records reviewed and will need to see his primary care doctor, Dr. Quay Burow for medication for this.  He is comfortable and in no acute distress at this time.        Final Clinical Impression(s) / ED Diagnoses Final diagnoses:  None    Rx / DC Orders ED Discharge Orders     None         Lacretia Leigh, MD 01/30/21 Johnnye Lana

## 2021-01-30 NOTE — ED Triage Notes (Signed)
Pt via pov from home with sharp left leg pain that has been going on for a while (months) but that is much much worse today. Pt states the pain is sporadic most of the time, but that today it has been much more often and mostly in the left leg. Historically it hits both legs, but today is mostly the left. Pt states he has been to specialists and doesn't have any answers. He is unable to have an mri due to his pacemaker. Pt alert & oriented, nad noted.

## 2021-01-30 NOTE — Discharge Instructions (Signed)
Your creatinine today is up to 2.6.  Call Dr. Justin Mend to schedule a follow-up visit.  Call Dr. Quay Burow for medication management for your pain

## 2021-02-02 ENCOUNTER — Other Ambulatory Visit: Payer: Self-pay | Admitting: Nephrology

## 2021-02-02 DIAGNOSIS — N183 Chronic kidney disease, stage 3 unspecified: Secondary | ICD-10-CM

## 2021-02-07 ENCOUNTER — Ambulatory Visit
Admission: RE | Admit: 2021-02-07 | Discharge: 2021-02-07 | Disposition: A | Discharge status: Home / Self Care | Payer: Medicare Other | Source: Ambulatory Visit | Attending: Nephrology | Admitting: Nephrology

## 2021-02-07 ENCOUNTER — Encounter (HOSPITAL_COMMUNITY): Payer: Self-pay

## 2021-02-07 ENCOUNTER — Encounter (HOSPITAL_COMMUNITY): Payer: Medicare Other

## 2021-02-07 DIAGNOSIS — N183 Chronic kidney disease, stage 3 unspecified: Secondary | ICD-10-CM

## 2021-02-08 ENCOUNTER — Encounter (HOSPITAL_COMMUNITY)
Admission: RE | Admit: 2021-02-08 | Discharge: 2021-02-08 | Disposition: A | Discharge status: Home / Self Care | Payer: Medicare Other | Source: Ambulatory Visit | Attending: Nephrology | Admitting: Nephrology

## 2021-02-08 ENCOUNTER — Other Ambulatory Visit: Payer: Self-pay

## 2021-02-08 VITALS — BP 138/76 | HR 79

## 2021-02-08 DIAGNOSIS — N183 Chronic kidney disease, stage 3 unspecified: Secondary | ICD-10-CM | POA: Insufficient documentation

## 2021-02-08 DIAGNOSIS — D631 Anemia in chronic kidney disease: Secondary | ICD-10-CM | POA: Insufficient documentation

## 2021-02-08 DIAGNOSIS — N184 Chronic kidney disease, stage 4 (severe): Secondary | ICD-10-CM | POA: Diagnosis present

## 2021-02-08 LAB — IRON AND TIBC
Iron: 39 ug/dL — ABNORMAL LOW (ref 45–182)
Saturation Ratios: 13 % — ABNORMAL LOW (ref 17.9–39.5)
TIBC: 293 ug/dL (ref 250–450)
UIBC: 254 ug/dL

## 2021-02-08 LAB — FERRITIN: Ferritin: 125 ng/mL (ref 24–336)

## 2021-02-08 LAB — POCT HEMOGLOBIN-HEMACUE: Hemoglobin: 11.5 g/dL — ABNORMAL LOW (ref 13.0–17.0)

## 2021-02-08 MED ORDER — EPOETIN ALFA-EPBX 10000 UNIT/ML IJ SOLN
INTRAMUSCULAR | Status: AC
  Filled 2021-02-08: qty 2

## 2021-02-08 MED ORDER — EPOETIN ALFA-EPBX 10000 UNIT/ML IJ SOLN
20000.0000 [IU] | INTRAMUSCULAR | Status: DC
  Administered 2021-02-08: 20000 [IU] via SUBCUTANEOUS

## 2021-02-22 ENCOUNTER — Encounter (HOSPITAL_COMMUNITY)
Admission: RE | Admit: 2021-02-22 | Discharge: 2021-02-22 | Disposition: A | Discharge status: Home / Self Care | Payer: Medicare Other | Source: Ambulatory Visit | Attending: Nephrology | Admitting: Nephrology

## 2021-02-22 DIAGNOSIS — N184 Chronic kidney disease, stage 4 (severe): Secondary | ICD-10-CM | POA: Diagnosis not present

## 2021-02-22 DIAGNOSIS — N183 Chronic kidney disease, stage 3 unspecified: Secondary | ICD-10-CM

## 2021-02-22 LAB — RENAL FUNCTION PANEL
Albumin: 4.2 g/dL (ref 3.5–5.0)
Anion gap: 8 (ref 5–15)
BUN: 44 mg/dL — ABNORMAL HIGH (ref 8–23)
CO2: 23 mmol/L (ref 22–32)
Calcium: 9.6 mg/dL (ref 8.9–10.3)
Chloride: 107 mmol/L (ref 98–111)
Creatinine, Ser: 2.2 mg/dL — ABNORMAL HIGH (ref 0.61–1.24)
GFR, Estimated: 27 mL/min — ABNORMAL LOW (ref >60)
Glucose, Bld: 93 mg/dL (ref 70–99)
Phosphorus: 2.9 mg/dL (ref 2.5–4.6)
Potassium: 4.2 mmol/L (ref 3.5–5.1)
Sodium: 138 mmol/L (ref 135–145)

## 2021-02-22 LAB — POCT HEMOGLOBIN-HEMACUE: Hemoglobin: 12.6 g/dL — ABNORMAL LOW (ref 13.0–17.0)

## 2021-02-22 MED ORDER — EPOETIN ALFA-EPBX 10000 UNIT/ML IJ SOLN: 20000.0000 [IU] | INTRAMUSCULAR | Status: DC

## 2021-02-23 ENCOUNTER — Ambulatory Visit: Payer: Medicare Other

## 2021-02-24 ENCOUNTER — Other Ambulatory Visit: Payer: Self-pay

## 2021-02-24 ENCOUNTER — Ambulatory Visit (INDEPENDENT_AMBULATORY_CARE_PROVIDER_SITE_OTHER): Payer: Medicare Other

## 2021-02-24 DIAGNOSIS — E538 Deficiency of other specified B group vitamins: Secondary | ICD-10-CM | POA: Diagnosis not present

## 2021-02-24 MED ORDER — CYANOCOBALAMIN 1000 MCG/ML IJ SOLN
1000.0000 ug | Freq: Once | INTRAMUSCULAR | Status: AC
  Administered 2021-02-24: 1000 ug via INTRAMUSCULAR

## 2021-02-24 NOTE — Progress Notes (Signed)
Pt here for monthly B12 injection per Dr. Quay Burow  B12 1023mcg given IM, and pt tolerated injection well.  Next B12 injection scheduled for 03/24/21.

## 2021-03-06 ENCOUNTER — Telehealth: Payer: Self-pay | Admitting: Cardiovascular Disease

## 2021-03-06 ENCOUNTER — Ambulatory Visit: Payer: Medicare Other | Admitting: Neurology

## 2021-03-06 NOTE — Telephone Encounter (Signed)
Attempted to return patient phone call.  Advised it is ok to use a leg massager as long he is using it more than 6 inches from pacemaker.   [email or web address]

## 2021-03-06 NOTE — Telephone Encounter (Signed)
Pt called in and stated that one of his dr suggested he get a foot massager.  Pt stated he has pain in his legs and feet.  He stated every massager he has ordered or bought states "do not use if you have a pacemaker".  He would like to know if Dr Burt Knack could suggest something that he could use with a pacemaker   Best number [phone]

## 2021-03-08 ENCOUNTER — Encounter (HOSPITAL_COMMUNITY)
Admission: RE | Admit: 2021-03-08 | Discharge: 2021-03-08 | Disposition: A | Discharge status: Home / Self Care | Payer: Medicare Other | Source: Ambulatory Visit | Attending: Nephrology | Admitting: Nephrology

## 2021-03-08 VITALS — BP 124/66 | HR 70 | Temp 97.6°F | Resp 18

## 2021-03-08 DIAGNOSIS — D631 Anemia in chronic kidney disease: Secondary | ICD-10-CM | POA: Insufficient documentation

## 2021-03-08 DIAGNOSIS — N183 Chronic kidney disease, stage 3 unspecified: Secondary | ICD-10-CM | POA: Diagnosis present

## 2021-03-08 LAB — IRON AND TIBC
Iron: 35 ug/dL — ABNORMAL LOW (ref 45–182)
Saturation Ratios: 12 % — ABNORMAL LOW (ref 17.9–39.5)
TIBC: 290 ug/dL (ref 250–450)
UIBC: 255 ug/dL

## 2021-03-08 LAB — FERRITIN: Ferritin: 120 ng/mL (ref 24–336)

## 2021-03-08 LAB — POCT HEMOGLOBIN-HEMACUE: Hemoglobin: 12.2 g/dL — ABNORMAL LOW (ref 13.0–17.0)

## 2021-03-08 MED ORDER — EPOETIN ALFA-EPBX 10000 UNIT/ML IJ SOLN: 20000.0000 [IU] | INTRAMUSCULAR | Status: DC

## 2021-03-08 NOTE — Progress Notes (Signed)
PATIENT: [name] DOB: 1926-11-02  REASON FOR VISIT: Follow up for muscle fatigue HISTORY FROM: Patient, wife  PRIMARY NEUROLOGIST: Dr. Krista Blue   HISTORY OF PRESENT ILLNESS: Today 03/09/21 Mr. [name] here today for follow-up with history of muscle fatigue. The sensation comes and goes. His calves go to sleep sometimes. Often when he stands the legs feel like he has no control. No falls. Uses cane for stability. Has done PT twice it helps, but feels fatigued after. He doesn't sleep, only 3-4 hours a night for years. Has kidney disease, creatinine was 2.27 Feb 2021. In Jan 2023 for stabbing pain in left leg (creatinine was 2.62). Will have stabbing pain in left thigh occasionally, can go weeks without. Last week happened, massaged it with good benefit. Gets B12 injection every 30 days. He uses an aspercreame OTC with good benefit. Just started this week back to exercising at the gym (weights, bike, stretching). Has noted his sleep has been much better since starting the exercise  HISTORY 09/01/2020 Dr. Jannifer Franklin: Mr. [name] is a 88 year old right-handed white male with a history of sensation of muscle fatigue.  The patient was seen just recently, he was not found to have any true weakness of the extremities but he feels as if his legs are weak or tight when he tries to walk or exercise.  He is engaged in a muscle toning exercise program, he works on toning up muscles in his arms and legs.  He tries to walk on a regular basis but he notes that he gets short winded if he walks more than 350 steps or so.  The patient may occasionally have sharp shooting pains in the thigh area, left greater than right, occasionally this may wake him up at night.  The episodes are only occasional however and are quite brief.  He denies any falls.  He has been evaluated for his shortness of breath, no definite cause of this was noted.  The patient does have a moderate level of aortic stenosis.  He indicates that  the shortness of breath is a limiting factor in his ability to perform physical activity.  He denies any muscle cramps in the legs.  He returns here for further evaluation.   REVIEW OF SYSTEMS: Out of a complete 14 system review of symptoms, the patient complains only of the following symptoms, and all other reviewed systems are negative.  See HPI  ALLERGIES: Allergies  Allergen Reactions   Aspirin Nausea And Vomiting and Other (See Comments)    Upset stomach- tolerates coated aspirin    Lisinopril Anaphylaxis and Shortness Of Breath    After 3 tablets, he experienced trouble swallowing, throat tightness and hoarseness.    Amoxicillin Nausea Only   Atarax [Hydroxyzine Hcl] Nausea And Vomiting   Cephalexin Diarrhea   Ciprofloxacin Nausea Only   Clindamycin Other (See Comments)    Reaction not recalled   Clobetasol Other (See Comments)    Not effective   Codeine Nausea Only and Other (See Comments)    Stomach upset   Fish Allergy Nausea And Vomiting   Fluarix [Influenza Virus Vaccine] Itching   Haemophilus Influenzae Itching   Hydrocodone Nausea Only   Hydrocodone-Acetaminophen Nausea And Vomiting   Hydroxyzine Nausea And Vomiting   Latex Itching and Other (See Comments)    (After flu shot)   Macrobid [Nitrofurantoin Macrocrystal] Nausea Only   Niacin Other (See Comments)    Unknown reaction   Niacin-Lovastatin Er Other (See Comments)  Unsure of reaction. Taking simvastatin at home without problems   Niacin-Lovastatin Er Other (See Comments)    Reaction not recalled   Nitrofurantoin Other (See Comments)    Upset stomach    Omeprazole Other (See Comments)    Reaction not recalled- stopped by MD   Other Nausea And Vomiting   Tramadol    Vibramycin [Doxycycline] Other (See Comments)    Reaction not recalled   Adhesive [Tape] Rash   Bactrim [Sulfamethoxazole-Trimethoprim] Rash   Colchicine Rash   Gabapentin     Painful pimples on tongue   Nortriptyline Rash    HOME  MEDICATIONS: Outpatient Medications Prior to Visit  Medication Sig Dispense Refill   acetaminophen (TYLENOL) 500 MG tablet Take 500 mg by mouth every 6 (six) hours as needed.     allopurinol (ZYLOPRIM) 100 MG tablet TAKE 1 TABLET(100 MG) BY MOUTH DAILY 90 tablet 1   amLODipine (NORVASC) 5 MG tablet TAKE 1 TABLET DAILY 90 tablet 3   Aromatic Inhalants (VICKS VAPOR INHALER IN) Place 1 puff into both nostrils as needed (for congestion).     Ascorbic Acid (VITAMIN C) 1000 MG tablet      aspirin EC 81 MG tablet Take 81 mg by mouth daily.     Calcium Citrate-Vitamin D (CITRACAL + D PO) Take 2 tablets by mouth in the morning and at bedtime.     Carboxymeth-Glycerin-Polysorb (REFRESH OPTIVE MEGA-3 OP) Place 1 drop into both eyes 2 (two) times daily.     Cholecalciferol 25 MCG (1000 UT) capsule Take 1,000 Units by mouth daily.     clobetasol cream (TEMOVATE) 0.05 % Apply topically 2 (two) times daily.     Cyanocobalamin (VITAMIN B12) 1000 MCG TBCR 1 tablet     epoetin alfa-epbx (RETACRIT) 2000 UNIT/ML injection 2,000 Units every 14 (fourteen) days. Patient states he only gets it when he needs it but not every 2 weeks     folic acid (FOLVITE) 1 MG tablet Take 1 tablet (1 mg total) by mouth daily. Annual appt due in May must see provider for future refills 90 tablet 0   furosemide (LASIX) 40 MG tablet TAKE 1 TABLET BY MOUTH EVERY DAY 90 tablet 0   halobetasol (ULTRAVATE) 0.05 % cream Apply topically.     loratadine (CLARITIN) 10 MG tablet Take 10 mg by mouth daily as needed (for seasonal allergies).     Multiple Vitamins-Minerals (PRESERVISION AREDS 2 PO) Take by mouth.     nitroGLYCERIN (NITROSTAT) 0.4 MG SL tablet DISSOLVE 1 TABLET UNDER THE TONGUE EVERY 5 MINUTES AS NEEDED FOR CHEST PAIN, MAXIMUM 3 TABLETS 100 tablet 1   Saline (AYR NASAL MIST ALLERGY/SINUS NA) Place into the nose.     traMADol (ULTRAM) 50 MG tablet Take 50 mg by mouth 3 (three) times daily as needed.     vitamin C (ASCORBIC ACID) 500  MG tablet Take 1,000 mg by mouth daily.      Vitamin D, Ergocalciferol, (DRISDOL) 1.25 MG (50000 UT) CAPS capsule Take 50,000 Units by mouth every 30 (thirty) days.      Facility-Administered Medications Prior to Visit  Medication Dose Route Frequency Provider Last Rate Last Admin   NON FORMULARY 1 application  1 application Topical PRN Landis Martins, DPM        PAST MEDICAL HISTORY: Past Medical History:  Diagnosis Date   Acute superficial venous thrombosis of lower extremity    a. RLE after CABG, neg dopp for DVT.   Allergy  Anemia    AV block, 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 S/P stenting to mid RCA, prox PDA 06/1999. b. NSTEMI/CABG x 3 in 10/2011 with LIMA to LAD, SVG to PDA, and SVG to OM1.    Cephalalgia 07/14/2015   CHB (complete heart block) 05/03/2012   Overview:  Status post complete heart block heart rate 28 bpm, alternating with 2 to one AV block and drug for bradycardia.  Status post pacemaker implantation.status post Medtronic pacemaker implantation the 03/28/2012   Chronic diastolic CHF (congestive heart failure) 02/05/2018   Chronic gouty arthritis    Chronic UTI    a. Followed by Dr. Risa Grill - colonized/asymptomatic - not on abx   CKD (chronic kidney disease)    stage 3, GFR 30-59 ml/min; stable with a creatinine around 1.9-2.0 followed by nephrology.   Constipation 03/01/2015   Symptoms and exam consistent with constipation. Abdominal exam is benign with no evidence of pain or obstruction. Discussed importance of increasing fiber and water intake coupled with physical activity to assist with bowel movements. Continue over-the-counter medication management as needed. Follow-up if symptoms worsen or fail to improve.   Coronary atherosclerosis 11/27/2011   Overview:  Multivessel coronary artery disease recently diagnosed by catheterization 2013 History of stent placement with a  heparin-coated stent 2001  Last Assessment & Plan:  Status post coronary bypass grafting.  No recurrent chest pain. Overview:  Overview:  S/P stenting to mid RCA, prox PDA 06/1999;  06/2007 Myoview: negative except for apical thinning, EF 68%, last Myoview August 10, 2008 with n   Degeneration of lumbar intervertebral disc    Degenerative disc disease, cervical, with radiculopathy 87/56/4332   Diastolic dysfunction 95/18/8416   Grade 1 DD on Echo 06/2017   Dyslipidemia    Dysphagia 02/06/2016   3/18 - DG Esophagus:  1. Mild esophageal dysmotility, likely presbyesophagus. 2. No other explanation for patient's symptoms. 3. Small hiatal hernia.  On swallow eval - evidence of cervical spine disease and that was likely contributing   Echocardiogram    Echocardiogram 08/2018: EF 50-55, inf-lat AK, mild LVH, Gr 1 DD, RVSP 47, mild LAE, mild to mod MR, mild AI, mild AS (mean 11)   Epistaxis 12/08/2020   Essential hypertension 07/20/2006   Fatigue 05/18/2017   GERD (gastroesophageal reflux disease)    Gout 05/12/2018   Gouty arthritis of right great toe 09/03/2017   Left toe Injected January 31, 2018    Hyperpigmentation 11/04/2017   Internal hemorrhoids 08/15/2016   Left inguinal hernia 07/01/2019   Lumbar post-laminectomy syndrome 12/12/2017   Moderate aortic regurgitation 07/04/2017   Echo 06/2017:  EF 55-60%, mild LVH, grade 1 DD, mild AS, mod AR, mild MR, mild-mod TR   Myocardial infarction    Neck injury    a. C3-C4 and C4-C5 foraminal narrowing, severe   Numbness of left foot 10/21/2019   Pacemaker 04/10/2012   Medtronic pacemaker  Last Assessment & Plan:  Status post pacemaker implantation for symptomatic bradycardia.  Pacemaker site is slightly tender and erythematous.  A prescription of Keflex was dispensed.  I also asked the patient to closely followup with the EP service regarding his pacemaker placement. 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  This was switched to by mouth amiodarone at the time of the thoracic surgery appointment was discontinued.   Presence of aortocoronary bypass graft 10/24/2011   Overview:  Performed at [facility] 2013 Last Assessment & Plan:  No complication post coronary bypass grafting.   Prostate cancer ([place])    a. 2001 s/p TURP.   Pulmonary nodule    a. felt to be noncancerous.  Status post followup CT scan 4 mm and stable.   Renal artery stenosis    a. 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   Trochanteric bursitis of hip, bilateral 01/10/2017   UGIB (upper gastrointestinal bleed) 02/01/2018   EGD  02/06/18 - mod, non-erosive gastritis   Venous insufficiency of leg 06/05/2010   Vitamin B12 deficiency 08/23/2009   Jan '14  July '14 B12 level  >1500    472   Weakness of both lower extremities 04/09/2020    PAST SURGICAL HISTORY: Past Surgical History:  Procedure Laterality Date   ANTERIOR CHAMBER WASHOUT Left 10/04/2018   Procedure: Anterior Chamber Washout, Vitreous Tap;  Surgeon: Jalene Mullet, MD;  Location: [place];  Service: Ophthalmology;  Laterality: Left;   cardia catherization  07-07-99   CARDIAC SURGERY  10/18/12   open heart surgery   CATARACT EXTRACTION W/ INTRAOCULAR LENS  IMPLANT, BILATERAL  3/205, 06/2013   [facility]   COLONOSCOPY  04/12/07   CORONARY ARTERY BYPASS GRAFT  10/19/2011   Procedure:  CORONARY ARTERY BYPASS GRAFTING (CABG);  Surgeon: Gaye Pollack, MD;  Location: [place];  Service: Open Heart Surgery;  Laterality: N/A;  times three using Left Internal Mammary Artery and Right Greater Saphenouse Vein Graft harvested Endoscopically   edg  07-17-1994   FLEXIBLE SIGMOIDOSCOPY  11-03-1997   GAS INSERTION Left 10/04/2018   Procedure: Insertion Of Gas;  Surgeon: Jalene Mullet, MD;  Location: [place];  Service: Ophthalmology;  Laterality: Left;   GAS/FLUID EXCHANGE Left 10/04/2018   Procedure: Gas/Fluid Exchange;  Surgeon: Jalene Mullet, MD;  Location: [place];  Service: Ophthalmology;  Laterality: Left;   LEFT HEART CATHETERIZATION WITH CORONARY ANGIOGRAM N/A 10/16/2011   Procedure: LEFT HEART CATHETERIZATION WITH CORONARY ANGIOGRAM;  Surgeon: Peter M Martinique, MD;  Location: [facility] CATH LAB;  Service: Cardiovascular;  Laterality: N/A;   LEFT HEART CATHETERIZATION WITH CORONARY/GRAFT ANGIOGRAM N/A 03/11/2013   Procedure: LEFT HEART CATHETERIZATION WITH [name];  Surgeon: Blane Ohara, MD;  Location: [facility] CATH LAB;  Service: Cardiovascular;  Laterality: N/A;   lumbar spinal disk and neck fusion surgery     PACEMAKER INSERTION  03/28/12   PPM implanted for mobitz II AV block   PARS PLANA VITRECTOMY Left 10/04/2018   Procedure: PARS PLANA VITRECTOMY 25 GAUGE FOR ENDOPHTHALMITIS WITH INJECTION OF INTRAVITREAL ANTIBIOTIC;  Surgeon: Jalene Mullet, MD;  Location: [place];  Service: Ophthalmology;  Laterality: Left;   peripheral vascular catherization  11-24-03   PERMANENT PACEMAKER INSERTION N/A 03/28/2012   Procedure: PERMANENT PACEMAKER INSERTION;  Surgeon: Thompson Grayer, MD;  Location: [facility] CATH LAB;  Service: Cardiovascular;  Laterality: N/A;   PROSTATECTOMY  renal circulation  10-01-03   s/p ptca     stents     X 2   stress cardiolite  05-04-05   spring 09-negative except for apical thinning, EF 68%    FAMILY HISTORY: Family History  Problem Relation Age of Onset   Coronary  artery disease Father        died @ 50   Other Mother        cerebral hemorrhage - died @ 33   Arthritis Mother    Cancer Brother        Bladder   Prostate cancer Brother    Nephritis Brother        died @ age 84.   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  married '52 - 3 years, divorced; married '56 - 3 years divorced; married '67-12 yrs - divorced; married '75 -. 1 son '57; 1 daughter - '53; 1 grandchild.  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  Very happily married.  End of life care: yes CPR, no long term mechanical ventilation, no heroic measures.right handed   Right handed   Social Determinants of Health   Financial Resource Strain: Low Risk    Difficulty of Paying Living Expenses: Not hard at all  Food Insecurity: No Food Insecurity   Worried About Charity fundraiser in the Last Year: Never true   Ran Out of Food in the Last Year: Never true  Transportation Needs: No Transportation Needs   Lack of Transportation (Medical): No   Lack of Transportation  (Non-Medical): No  Physical Activity: Sufficiently Active   Days of Exercise per Week: 5 days   Minutes of Exercise per Session: 30 min  Stress: No Stress Concern Present   Feeling of Stress : Not at all  Social Connections: Socially Integrated   Frequency of Communication with Friends and Family: More than three times a week   Frequency of Social Gatherings with Friends and Family: Once a week   Attends Religious Services: More than 4 times per year   Active Member of [employer] or Organizations: Yes   Attends Music therapist: More than 4 times per year   Marital Status: Married  Human resources officer Violence: Not At Risk   Fear of Current or Ex-Partner: No  Emotionally Abused: No   Physically Abused: No   Sexually Abused: No   PHYSICAL EXAM  Vitals:   03/09/21 1438  BP: (!) 175/77  Pulse: 69  Weight: 161 lb (73 kg)  Height: 5\' 8"  (1.727 m)   Body mass index is 24.48 kg/m.  Generalized: Well developed, in no acute distress   Neurological examination  Mentation: Alert oriented to time, place, history taking. Follows all commands speech and language fluent Cranial nerve II-XII: Pupils were equal round reactive to light. Extraocular movements were full, visual field were full on confrontational test. Facial sensation and strength were normal.  Head turning and shoulder shrug  were normal and symmetric. Motor: The motor testing reveals 5 over 5 strength of all 4 extremities. Good symmetric motor tone is noted throughout.  Sensory: Sensory testing is intact to soft touch on all 4 extremities. No evidence of extinction is noted.  Coordination: Cerebellar testing reveals good finger-nose-finger and heel-to-shin bilaterally.  Gait and station: Gait is wide based but steady Reflexes: Deep tendon reflexes are symmetric and normal bilaterally.   DIAGNOSTIC DATA (LABS, IMAGING, TESTING) - I reviewed patient records, labs, notes, testing and imaging myself where available.  Lab  Results  Component Value Date   WBC 5.9 01/30/2021   HGB 12.2 (L) 03/08/2021   HCT 38.6 (L) 01/30/2021   MCV 80.4 01/30/2021   PLT 218 01/30/2021      Component Value Date/Time   NA 138 02/22/2021 1344   NA 140 11/04/2017 1013   K 4.2 02/22/2021 1344   CL 107 02/22/2021 1344   CO2 23 02/22/2021 1344   GLUCOSE 93 02/22/2021 1344   BUN 44 (H) 02/22/2021 1344   BUN 29 (H) 11/04/2017 1013   CREATININE 2.20 (H) 02/22/2021 1344   CREATININE 2.15 (H) 05/19/2020 1433   CALCIUM 9.6 02/22/2021 1344   PROT 7.1 07/15/2020 1215   ALBUMIN 4.2 02/22/2021 1344   AST 16 05/19/2020 1433   ALT 9 05/19/2020 1433   ALKPHOS 63 05/19/2020 1433   BILITOT 0.7 05/19/2020 1433   GFRNONAA 27 (L) 02/22/2021 1344   GFRNONAA 28 (L) 05/19/2020 1433   GFRAA 43 (L) 04/28/2019 1349   Lab Results  Component Value Date   CHOL 122 09/04/2016   HDL 40.40 09/04/2016   LDLCALC 70 09/04/2016   TRIG 59.0 09/04/2016   CHOLHDL 3 09/04/2016   No results found for: HGBA1C Lab Results  Component Value Date   [id number] 253 07/15/2020   Lab Results  Component Value Date   TSH 1.827 02/02/2018   ASSESSMENT AND PLAN 88 y.o. year old male   1.  Muscle fatigue 2.  Lancinating pain to left greater than right thigh 3.  Chronic kidney disease, creatinine 2.20, GFR 27 4.  Peripheral Neuropathy by NCV  -recommend giving exercise program a chance to work, encouraged to continue exercise program, he is already seeing benefit with sleeping -has tried gabapentin, nortriptyline in the past without tolerance or benefit; symptoms are not frequent at this point; need to consider risk of medication given his age, CKD, numerous allergies (possibly, baclofen, carbamazepine, Lyrica, Cymbalta, another round of PT?) -follow up in 6 months or sooner if needed  Evangeline Dakin, DNP 03/09/2021, 2:44 PM [facility] [address] [place], [place] [address] [phone]

## 2021-03-09 ENCOUNTER — Encounter: Payer: Self-pay | Admitting: Neurology

## 2021-03-09 ENCOUNTER — Ambulatory Visit (INDEPENDENT_AMBULATORY_CARE_PROVIDER_SITE_OTHER): Payer: Medicare Other | Admitting: Neurology

## 2021-03-09 ENCOUNTER — Other Ambulatory Visit: Payer: Self-pay

## 2021-03-09 VITALS — BP 175/77 | HR 69 | Ht 68.0 in | Wt 161.0 lb

## 2021-03-09 DIAGNOSIS — G6289 Other specified polyneuropathies: Secondary | ICD-10-CM | POA: Diagnosis not present

## 2021-03-09 DIAGNOSIS — R29898 Other symptoms and signs involving the musculoskeletal system: Secondary | ICD-10-CM

## 2021-03-09 NOTE — Patient Instructions (Addendum)
Continue exercise, activity  ?Monitor your symptoms ?See you back in 6 months ?

## 2021-03-14 NOTE — Progress Notes (Signed)
Chart reviewed, agree above plan ?

## 2021-03-22 ENCOUNTER — Encounter (HOSPITAL_COMMUNITY)
Admission: RE | Admit: 2021-03-22 | Discharge: 2021-03-22 | Disposition: A | Discharge status: Home / Self Care | Payer: Medicare Other | Source: Ambulatory Visit | Attending: Nephrology | Admitting: Nephrology

## 2021-03-22 VITALS — BP 150/77 | HR 72 | Resp 18

## 2021-03-22 DIAGNOSIS — N183 Chronic kidney disease, stage 3 unspecified: Secondary | ICD-10-CM

## 2021-03-22 LAB — RENAL FUNCTION PANEL
Albumin: 4.1 g/dL (ref 3.5–5.0)
Anion gap: 9 (ref 5–15)
BUN: 30 mg/dL — ABNORMAL HIGH (ref 8–23)
CO2: 24 mmol/L (ref 22–32)
Calcium: 9.5 mg/dL (ref 8.9–10.3)
Chloride: 103 mmol/L (ref 98–111)
Creatinine, Ser: 2.11 mg/dL — ABNORMAL HIGH (ref 0.61–1.24)
GFR, Estimated: 29 mL/min — ABNORMAL LOW (ref >60)
Glucose, Bld: 91 mg/dL (ref 70–99)
Phosphorus: 3.1 mg/dL (ref 2.5–4.6)
Potassium: 3.9 mmol/L (ref 3.5–5.1)
Sodium: 136 mmol/L (ref 135–145)

## 2021-03-22 LAB — POCT HEMOGLOBIN-HEMACUE: Hemoglobin: 12.3 g/dL — ABNORMAL LOW (ref 13.0–17.0)

## 2021-03-22 MED ORDER — EPOETIN ALFA-EPBX 10000 UNIT/ML IJ SOLN: 20000.0000 [IU] | INTRAMUSCULAR | Status: DC

## 2021-03-23 ENCOUNTER — Other Ambulatory Visit: Payer: Self-pay | Admitting: Internal Medicine

## 2021-03-24 ENCOUNTER — Other Ambulatory Visit: Payer: Self-pay

## 2021-03-24 ENCOUNTER — Ambulatory Visit (INDEPENDENT_AMBULATORY_CARE_PROVIDER_SITE_OTHER): Payer: Medicare Other

## 2021-03-24 DIAGNOSIS — E538 Deficiency of other specified B group vitamins: Secondary | ICD-10-CM

## 2021-03-24 MED ORDER — CYANOCOBALAMIN 1000 MCG/ML IJ SOLN
1000.0000 ug | Freq: Once | INTRAMUSCULAR | Status: AC
  Administered 2021-03-24: 1000 ug via INTRAMUSCULAR

## 2021-03-24 NOTE — Progress Notes (Signed)
Pt here for monthly B12 injection  ? ?B12 1025mg given IM, and pt tolerated injection well. ? ?Next B12 injection scheduled for 04/24/21 ? ?

## 2021-03-26 NOTE — Progress Notes (Signed)
? ?Cardiology Office Note ?Date:  03/27/2021  ?Patient ID:  [name], DOB [birth year], MRN [id number] ?PCP:  [name], MD  ?Cardiologist:  Dr. Burt Knack ?Electrophysiologist: Dr. Rayann Heman ?Dr. Justin Mend, nephrology ? ?  ?Chief Complaint: annual visit ? ?History of Present Illness: ?[name] is a 88 y.o. male with history of CAD (CABG 2013, with early graft failure of all of his venous conduits and continued patency of the mammary artery to LAD graft), HTN, HLD, CKD (IIIb), bradycardia w/PPM, AFib (post-op), chronic CHF (diastolic).. ? ?He comes in today to be seen for Dr. Rayann Heman, last seen by him March 2022, doing well with mild orthopnea/SOB in the mornings, neuropathy pain, no other complaints.  Intact pacer function advised minimizing sodium. ? ?Most recently saw Dr. Burt Knack Dec 2022, struggling with fatigue and DOE, discussed his mod AS and need for surveillance echos, perhaps need for TAVR in the future, no changes were made. ? ?TODAY ?He is accompanied by his wife. ?He is doing OK, struggles with back problems and neuropathy pain. ?He saw his nephrologist last week and his BP was much better, and usually is. ?No CP, palpitations ?He has some SON when he 1st lays down in bed, this settles and denies nocturnal SOB ?No SOB otherwise. ?No dizzy spells, near syncope or syncope. ? ?AFib/AAD hx ?seen post CABG ?Amiodarone briefly post CABG 2013 ? ? ?Device information ?MDT dual chamber PPM implanted 03/28/2012 ? ? ?Past Medical History:  ?Diagnosis Date  ? Acute superficial venous thrombosis of lower extremity   ? a. RLE after CABG, neg dopp for DVT.  ? Allergy   ? Anemia   ? AV block, 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 03/26/2012  ? Bilateral plantar fasciitis 10/25/2020  ? CAD (coronary artery disease)   ? a. S/P stenting to mid RCA, prox PDA 06/1999. b. NSTEMI/CABG x 3 in 10/2011 with LIMA to LAD, SVG to PDA, and SVG to OM1.   ? Cephalalgia 07/14/2015  ? CHB (complete heart block) 05/03/2012  ?  Overview:  Status post complete heart block heart rate 28 bpm, alternating with 2 to one AV block and drug for bradycardia.  Status post pacemaker implantation.status post Medtronic pacemaker implantation the 03/28/2012  ? Chronic diastolic CHF (congestive heart failure) 02/05/2018  ? Chronic gouty arthritis   ? Chronic UTI   ? a. Followed by Dr. Risa Grill - colonized/asymptomatic - not on abx  ? CKD (chronic kidney disease)   ? stage 3, GFR 30-59 ml/min; stable with a creatinine around 1.9-2.0 followed by nephrology.  ? Constipation 03/01/2015  ? Symptoms and exam consistent with constipation. Abdominal exam is benign with no evidence of pain or obstruction. Discussed importance of increasing fiber and water intake coupled with physical activity to assist with bowel movements. Continue over-the-counter medication management as needed. Follow-up if symptoms worsen or fail to improve.  ? Coronary atherosclerosis 11/27/2011  ? Overview:  Multivessel coronary artery disease recently diagnosed by catheterization 2013 History of stent placement with a heparin-coated stent 2001  Last Assessment & Plan:  Status post coronary bypass grafting.  No recurrent chest pain. Overview:  Overview:  S/P stenting to mid RCA, prox PDA 06/1999;  06/2007 Myoview: negative except for apical thinning, EF 68%, last Myoview August 10, 2008 with n  ? Degeneration of lumbar intervertebral disc   ? Degenerative disc disease, cervical, with radiculopathy 09/21/2008  ? Diastolic dysfunction 93/81/8299  ? Grade 1 DD on Echo 06/2017  ? Dyslipidemia   ?  Dysphagia 02/06/2016  ? 3/18 - DG Esophagus:  1. Mild esophageal dysmotility, likely presbyesophagus. 2. No other explanation for patient's symptoms. 3. Small hiatal hernia.  On swallow eval - evidence of cervical spine disease and that was likely contributing  ? Echocardiogram   ? Echocardiogram 08/2018: EF 50-55, inf-lat AK, mild LVH, Gr 1 DD, RVSP 47, mild LAE, mild to mod MR, mild AI, mild AS (mean 11)  ?  Epistaxis 12/08/2020  ? Essential hypertension 07/20/2006  ? Fatigue 05/18/2017  ? GERD (gastroesophageal reflux disease)   ? Gout 05/12/2018  ? Gouty arthritis of right great toe 09/03/2017  ? Left toe Injected January 31, 2018   ? Hyperpigmentation 11/04/2017  ? Internal hemorrhoids 08/15/2016  ? Left inguinal hernia 07/01/2019  ? Lumbar post-laminectomy syndrome 12/12/2017  ? Moderate aortic regurgitation 07/04/2017  ? Echo 06/2017:  EF 55-60%, mild LVH, grade 1 DD, mild AS, mod AR, mild MR, mild-mod TR  ? Myocardial infarction   ? Neck injury   ? a. C3-C4 and C4-C5 foraminal narrowing, severe  ? Numbness of left foot 10/21/2019  ? Pacemaker 04/10/2012  ? Medtronic pacemaker  Last Assessment & Plan:  Status post pacemaker implantation for symptomatic bradycardia.  Pacemaker site is slightly tender and erythematous.  A prescription of Keflex was dispensed.  I also asked the patient to closely followup with the EP service regarding his pacemaker placement. I do not think there is a clear pacemaker infection, but we will order high-sensitiv  ? Pain in joint, shoulder region 10/14/2009  ? Pain in right foot 12/06/2020  ? Peripheral neuropathy 03/21/2009  ? 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 Polyarthralgia   ? Postoperative atrial fibrillation 05/03/2012  ? Last Assessment & Plan:  Patient reportedly was after his bypass surgery on amiodarone initially intravenously for postoperative atrial fibrillation.  This was switched to by mouth amiodarone at the time of the thoracic surgery appointment was discontinued.  ? Presence of aortocoronary bypass graft 10/24/2011  ? Overview:  Performed at [facility] 2013 Last Assessment & Plan:  No complication post coronary bypass grafting.  ? Prostate cancer ([place])   ? a. 2001 s/p TURP.  ? Pulmonary nodule   ? a. felt to be noncancerous.  Status post followup CT scan 4 mm and stable.  ? Renal  artery stenosis   ? a. 50% by cath 2001  ? S/P inguinal hernia repair 08/13/2019  ? Spinal stenosis of lumbar region 10/09/2017  ? Symptomatic bradycardia   ? Mobitz II AV block s/p Medtronic pacemaker 03/28/12  ? Trochanteric bursitis of hip, bilateral 01/10/2017  ? UGIB (upper gastrointestinal bleed) 02/01/2018  ? EGD  02/06/18 - mod, non-erosive gastritis  ? Venous insufficiency of leg 06/05/2010  ? Vitamin B12 deficiency 08/23/2009  ? Jan '14  July '14 B12 level  >1500    472  ? Weakness of both lower extremities 04/09/2020  ? ? ?Past Surgical History:  ?Procedure Laterality Date  ? ANTERIOR CHAMBER WASHOUT Left 10/04/2018  ? Procedure: Anterior Chamber Washout, Vitreous Tap;  Surgeon: Jalene Mullet, MD;  Location: [place];  Service: Ophthalmology;  Laterality: Left;  ? cardia catherization  07-07-99  ? CARDIAC SURGERY  10/18/12  ? open heart surgery  ? CATARACT EXTRACTION W/ INTRAOCULAR LENS  IMPLANT, BILATERAL  3/205, 06/2013  ? [email or web address]  ? COLONOSCOPY  04/12/07  ? CORONARY ARTERY BYPASS GRAFT  10/19/2011  ? Procedure: CORONARY  ARTERY BYPASS GRAFTING (CABG);  Surgeon: Gaye Pollack, MD;  Location: [place];  Service: Open Heart Surgery;  Laterality: N/A;  times three using Left Internal Mammary Artery and Right Greater Saphenouse Vein Graft harvested Endoscopically  ? edg  07-17-1994  ? FLEXIBLE SIGMOIDOSCOPY  11-03-1997  ? GAS INSERTION Left 10/04/2018  ? Procedure: Insertion Of Gas;  Surgeon: Jalene Mullet, MD;  Location: [place];  Service: Ophthalmology;  Laterality: Left;  ? GAS/FLUID EXCHANGE Left 10/04/2018  ? Procedure: Gas/Fluid Exchange;  Surgeon: Jalene Mullet, MD;  Location: [place];  Service: Ophthalmology;  Laterality: Left;  ? LEFT HEART CATHETERIZATION WITH CORONARY ANGIOGRAM N/A 10/16/2011  ? Procedure: LEFT HEART CATHETERIZATION WITH CORONARY ANGIOGRAM;  Surgeon: Peter M Martinique, MD;  Location: [facility] CATH LAB;  Service: Cardiovascular;  Laterality: N/A;  ? LEFT HEART CATHETERIZATION WITH CORONARY/GRAFT ANGIOGRAM  N/A 03/11/2013  ? Procedure: LEFT HEART CATHETERIZATION WITH [name];  Surgeon: Blane Ohara, MD;  Location: [facility] CATH LAB;  Service: Cardiovascular;  Laterality: N/A;  ? lumbar spinal disk

## 2021-03-27 ENCOUNTER — Encounter: Payer: Self-pay | Admitting: Physician Assistant

## 2021-03-27 ENCOUNTER — Other Ambulatory Visit: Payer: Self-pay

## 2021-03-27 ENCOUNTER — Ambulatory Visit (INDEPENDENT_AMBULATORY_CARE_PROVIDER_SITE_OTHER): Payer: Medicare Other | Admitting: Physician Assistant

## 2021-03-27 VITALS — BP 172/80 | HR 72 | Ht 71.0 in | Wt 161.0 lb

## 2021-03-27 DIAGNOSIS — I2510 Atherosclerotic heart disease of native coronary artery without angina pectoris: Secondary | ICD-10-CM | POA: Diagnosis not present

## 2021-03-27 DIAGNOSIS — I10 Essential (primary) hypertension: Secondary | ICD-10-CM

## 2021-03-27 DIAGNOSIS — I350 Nonrheumatic aortic (valve) stenosis: Secondary | ICD-10-CM

## 2021-03-27 DIAGNOSIS — I5032 Chronic diastolic (congestive) heart failure: Secondary | ICD-10-CM

## 2021-03-27 DIAGNOSIS — Z950 Presence of cardiac pacemaker: Secondary | ICD-10-CM | POA: Diagnosis not present

## 2021-03-27 DIAGNOSIS — I442 Atrioventricular block, complete: Secondary | ICD-10-CM | POA: Diagnosis not present

## 2021-03-27 LAB — CUP PACEART INCLINIC DEVICE CHECK
Battery Impedance: 1042 Ohm
Battery Remaining Longevity: 65 mo
Battery Voltage: 2.77 V
Brady Statistic AP VP Percent: 0 %
Brady Statistic AP VS Percent: 26 %
Brady Statistic AS VP Percent: 1 %
Brady Statistic AS VS Percent: 73 %
Date Time Interrogation Session: 20230320143213
Implantable Lead Implant Date: 20140321
Implantable Lead Implant Date: 20140321
Implantable Lead Location: 753859
Implantable Lead Location: 753860
Implantable Lead Model: 5076
Implantable Lead Model: 5092
Implantable Pulse Generator Implant Date: 20140321
Lead Channel Impedance Value: 401 Ohm
Lead Channel Impedance Value: 683 Ohm
Lead Channel Pacing Threshold Amplitude: 0.625 V
Lead Channel Pacing Threshold Amplitude: 0.75 V
Lead Channel Pacing Threshold Amplitude: 0.875 V
Lead Channel Pacing Threshold Amplitude: 1 V
Lead Channel Pacing Threshold Pulse Width: 0.4 ms
Lead Channel Pacing Threshold Pulse Width: 0.4 ms
Lead Channel Pacing Threshold Pulse Width: 0.4 ms
Lead Channel Pacing Threshold Pulse Width: 0.4 ms
Lead Channel Sensing Intrinsic Amplitude: 22.4 mV
Lead Channel Sensing Intrinsic Amplitude: 4 mV
Lead Channel Setting Pacing Amplitude: 2 V
Lead Channel Setting Pacing Amplitude: 2.5 V
Lead Channel Setting Pacing Pulse Width: 0.4 ms
Lead Channel Setting Sensing Sensitivity: 5.6 mV

## 2021-03-27 NOTE — Patient Instructions (Signed)
Medication Instructions:  ° °Your physician recommends that you continue on your current medications as directed. Please refer to the Current Medication list given to you today. ° °*If you need a refill on your cardiac medications before your next appointment, please call your pharmacy* ° ° °Lab Work:  NONE ORDERED  TODAY ° ° °If you have labs (blood work) drawn today and your tests are completely normal, you will receive your results only by: °MyChart Message (if you have MyChart) OR °A paper copy in the mail °If you have any lab test that is abnormal or we need to change your treatment, we will call you to review the results. ° ° °Testing/Procedures: NONE ORDERED  TODAY ° ° ° ° °Follow-Up: °At [facility], you and your health needs are our priority.  As part of our continuing mission to provide you with exceptional heart care, we have created designated Provider Care Teams.  These Care Teams include your primary Cardiologist (physician) and Advanced Practice Providers (APPs -  Physician Assistants and Nurse Practitioners) who all work together to provide you with the care you need, when you need it. ° °We recommend signing up for the patient portal called "MyChart".  Sign up information is provided on this After Visit Summary.  MyChart is used to connect with patients for Virtual Visits (Telemedicine).  Patients are able to view lab/test results, encounter notes, upcoming appointments, etc.  Non-urgent messages can be sent to your provider as well.   °To learn more about what you can do with MyChart, go to [email or web address].   ° °Your next appointment:   °1 year(s) ° °The format for your next appointment:   °In Person ° °Provider:   °Cameron Lambert, MD  ° ° °Other Instructions ° °

## 2021-04-05 ENCOUNTER — Other Ambulatory Visit: Payer: Self-pay | Admitting: Internal Medicine

## 2021-04-05 ENCOUNTER — Encounter (HOSPITAL_COMMUNITY): Payer: Medicare Other

## 2021-04-06 ENCOUNTER — Encounter (HOSPITAL_COMMUNITY)
Admission: RE | Admit: 2021-04-06 | Discharge: 2021-04-06 | Disposition: A | Discharge status: Home / Self Care | Payer: Medicare Other | Source: Ambulatory Visit | Attending: Nephrology | Admitting: Nephrology

## 2021-04-06 VITALS — BP 161/74 | HR 68 | Temp 97.5°F

## 2021-04-06 DIAGNOSIS — N183 Chronic kidney disease, stage 3 unspecified: Secondary | ICD-10-CM | POA: Diagnosis not present

## 2021-04-06 MED ORDER — EPOETIN ALFA-EPBX 10000 UNIT/ML IJ SOLN
20000.0000 [IU] | INTRAMUSCULAR | Status: DC
  Administered 2021-04-06: 20000 [IU] via SUBCUTANEOUS

## 2021-04-06 MED ORDER — EPOETIN ALFA-EPBX 10000 UNIT/ML IJ SOLN
INTRAMUSCULAR | Status: AC
  Filled 2021-04-06: qty 2

## 2021-04-07 LAB — POCT HEMOGLOBIN-HEMACUE: Hemoglobin: 11.6 g/dL — ABNORMAL LOW (ref 13.0–17.0)

## 2021-04-11 ENCOUNTER — Ambulatory Visit (INDEPENDENT_AMBULATORY_CARE_PROVIDER_SITE_OTHER): Payer: Medicare Other

## 2021-04-11 DIAGNOSIS — I442 Atrioventricular block, complete: Secondary | ICD-10-CM

## 2021-04-12 LAB — CUP PACEART REMOTE DEVICE CHECK
Battery Impedance: 1070 Ohm
Battery Remaining Longevity: 63 mo
Battery Voltage: 2.77 V
Brady Statistic AP VP Percent: 2 %
Brady Statistic AP VS Percent: 32 %
Brady Statistic AS VP Percent: 0 %
Brady Statistic AS VS Percent: 66 %
Date Time Interrogation Session: 20230404102041
Implantable Lead Implant Date: 20140321
Implantable Lead Implant Date: 20140321
Implantable Lead Location: 753859
Implantable Lead Location: 753860
Implantable Lead Model: 5076
Implantable Lead Model: 5092
Implantable Pulse Generator Implant Date: 20140321
Lead Channel Impedance Value: 400 Ohm
Lead Channel Impedance Value: 714 Ohm
Lead Channel Pacing Threshold Amplitude: 0.625 V
Lead Channel Pacing Threshold Amplitude: 0.875 V
Lead Channel Pacing Threshold Pulse Width: 0.4 ms
Lead Channel Pacing Threshold Pulse Width: 0.4 ms
Lead Channel Setting Pacing Amplitude: 2 V
Lead Channel Setting Pacing Amplitude: 2.5 V
Lead Channel Setting Pacing Pulse Width: 0.4 ms
Lead Channel Setting Sensing Sensitivity: 5.6 mV

## 2021-04-20 ENCOUNTER — Encounter (HOSPITAL_COMMUNITY)
Admission: RE | Admit: 2021-04-20 | Discharge: 2021-04-20 | Disposition: A | Discharge status: Home / Self Care | Payer: Medicare Other | Source: Ambulatory Visit | Attending: Nephrology | Admitting: Nephrology

## 2021-04-20 VITALS — BP 150/78 | HR 79 | Temp 97.5°F | Resp 18

## 2021-04-20 DIAGNOSIS — N183 Chronic kidney disease, stage 3 unspecified: Secondary | ICD-10-CM | POA: Diagnosis present

## 2021-04-20 LAB — RENAL FUNCTION PANEL
Albumin: 4.1 g/dL (ref 3.5–5.0)
Anion gap: 5 (ref 5–15)
BUN: 42 mg/dL — ABNORMAL HIGH (ref 8–23)
CO2: 25 mmol/L (ref 22–32)
Calcium: 9.4 mg/dL (ref 8.9–10.3)
Chloride: 107 mmol/L (ref 98–111)
Creatinine, Ser: 2.19 mg/dL — ABNORMAL HIGH (ref 0.61–1.24)
GFR, Estimated: 28 mL/min — ABNORMAL LOW (ref >60)
Glucose, Bld: 97 mg/dL (ref 70–99)
Phosphorus: 2.9 mg/dL (ref 2.5–4.6)
Potassium: 4.5 mmol/L (ref 3.5–5.1)
Sodium: 137 mmol/L (ref 135–145)

## 2021-04-20 LAB — IRON AND TIBC
Iron: 101 ug/dL (ref 45–182)
Saturation Ratios: 33 % (ref 17.9–39.5)
TIBC: 302 ug/dL (ref 250–450)
UIBC: 201 ug/dL

## 2021-04-20 LAB — POCT HEMOGLOBIN-HEMACUE: Hemoglobin: 12.4 g/dL — ABNORMAL LOW (ref 13.0–17.0)

## 2021-04-20 LAB — FERRITIN: Ferritin: 102 ng/mL (ref 24–336)

## 2021-04-20 MED ORDER — EPOETIN ALFA-EPBX 10000 UNIT/ML IJ SOLN: 20000.0000 [IU] | INTRAMUSCULAR | Status: DC

## 2021-04-24 ENCOUNTER — Ambulatory Visit (INDEPENDENT_AMBULATORY_CARE_PROVIDER_SITE_OTHER): Payer: Medicare Other

## 2021-04-24 DIAGNOSIS — E538 Deficiency of other specified B group vitamins: Secondary | ICD-10-CM | POA: Diagnosis not present

## 2021-04-24 MED ORDER — CYANOCOBALAMIN 1000 MCG/ML IJ SOLN
1000.0000 ug | Freq: Once | INTRAMUSCULAR | Status: AC
  Administered 2021-04-24: 1000 ug via INTRAMUSCULAR

## 2021-04-24 NOTE — Progress Notes (Signed)
Pt here for monthly B12 injection per Dr. Quay Burow ? ?B12 1019mg given IM, and pt tolerated injection well. ? ?Next B12 injection scheduled for 05/25/21 ?

## 2021-04-27 NOTE — Progress Notes (Signed)
Remote pacemaker transmission.   

## 2021-05-04 ENCOUNTER — Encounter (HOSPITAL_COMMUNITY)
Admission: RE | Admit: 2021-05-04 | Discharge: 2021-05-04 | Disposition: A | Discharge status: Home / Self Care | Payer: Medicare Other | Source: Ambulatory Visit | Attending: Nephrology | Admitting: Nephrology

## 2021-05-04 VITALS — BP 166/79 | HR 74 | Temp 97.7°F | Resp 18

## 2021-05-04 DIAGNOSIS — N183 Chronic kidney disease, stage 3 unspecified: Secondary | ICD-10-CM

## 2021-05-04 LAB — POCT HEMOGLOBIN-HEMACUE: Hemoglobin: 12.6 g/dL — ABNORMAL LOW (ref 13.0–17.0)

## 2021-05-04 MED ORDER — EPOETIN ALFA-EPBX 10000 UNIT/ML IJ SOLN
INTRAMUSCULAR | Status: AC
  Filled 2021-05-04: qty 1

## 2021-05-04 MED ORDER — EPOETIN ALFA-EPBX 10000 UNIT/ML IJ SOLN: 10000.0000 [IU] | INTRAMUSCULAR | Status: DC

## 2021-05-08 ENCOUNTER — Encounter: Payer: Self-pay | Admitting: Internal Medicine

## 2021-05-08 NOTE — Progress Notes (Signed)
? ? ? ? ?Subjective:  ? ? Patient ID: [name], male    DOB: 11/16/1926, 88 y.o.   MRN: [id number] ? ?This visit occurred during the SARS-CoV-2 public health emergency.  Safety protocols were in place, including screening questions prior to the visit, additional usage of staff PPE, and extensive cleaning of exam room while observing appropriate contact time as indicated for disinfecting solutions.   ? ? ?HPI ?Khing is here for follow up of his chronic medical problems, including polyneuropathy, leg edema, lasix prn, htn, CAD, gout, CKD, prostate ca, B12 def, insomnia ? ?Can fall asleep, but can not stay asleep.  He did try restoril, but it made him wired.  Gets 2-3 hrs of interrupted sleep at night.  Dozes during the day.  He is exhausted.  He does have memory issues, but his memory evaluation was pretty good. ? ?He is exercising.  He is eating well. ? ? ? ?Medications and allergies reviewed with patient and updated if appropriate. ? ?Current Outpatient Medications on File Prior to Visit  ?Medication Sig Dispense Refill  ? acetaminophen (TYLENOL) 500 MG tablet Take 500 mg by mouth every 6 (six) hours as needed.    ? allopurinol (ZYLOPRIM) 100 MG tablet TAKE 1 TABLET(100 MG) BY MOUTH DAILY 90 tablet 1  ? amLODipine (NORVASC) 5 MG tablet TAKE 1 TABLET DAILY 90 tablet 3  ? Aromatic Inhalants (VICKS VAPOR INHALER IN) Place 1 puff into both nostrils as needed (for congestion).    ? Ascorbic Acid (VITAMIN C) 1000 MG tablet     ? aspirin EC 81 MG tablet Take 81 mg by mouth daily.    ? Calcium Citrate-Vitamin D (CITRACAL + D PO) Take 2 tablets by mouth in the morning and at bedtime.    ? Carboxymeth-Glycerin-Polysorb (REFRESH OPTIVE MEGA-3 OP) Place 1 drop into both eyes 2 (two) times daily.    ? Cholecalciferol 25 MCG (1000 UT) capsule Take 1,000 Units by mouth daily.    ? clobetasol cream (TEMOVATE) 0.05 % Apply topically 2 (two) times daily.    ? Cyanocobalamin (VITAMIN B12) 1000 MCG TBCR 1 tablet    ? epoetin  alfa-epbx (RETACRIT) 2000 UNIT/ML injection 2,000 Units every 14 (fourteen) days. Patient states he only gets it when he needs it but not every 2 weeks    ? folic acid (FOLVITE) 1 MG tablet Take 1 tablet (1 mg total) by mouth daily. Annual appt due in May must see provider for future refills 90 tablet 0  ? furosemide (LASIX) 40 MG tablet TAKE 1 TABLET BY MOUTH EVERY DAY 90 tablet 0  ? halobetasol (ULTRAVATE) 0.05 % cream Apply topically as needed.    ? loratadine (CLARITIN) 10 MG tablet Take 10 mg by mouth daily as needed (for seasonal allergies).    ? Multiple Vitamins-Minerals (PRESERVISION AREDS 2 PO) Take by mouth.    ? nitroGLYCERIN (NITROSTAT) 0.4 MG SL tablet DISSOLVE 1 TABLET UNDER THE TONGUE EVERY 5 MINUTES AS NEEDED FOR CHEST PAIN, MAXIMUM 3 TABLETS 100 tablet 1  ? Saline (AYR NASAL MIST ALLERGY/SINUS NA) Place into the nose.    ? ?Current Facility-Administered Medications on File Prior to Visit  ?Medication Dose Route Frequency Provider Last Rate Last Admin  ? NON FORMULARY 1 application  1 application. Topical PRN Landis Martins, DPM      ? ? ? ?Review of Systems  ?Constitutional:  Negative for fever.  ?Respiratory:  Positive for cough (since covid - still with mucus in  throat) and shortness of breath. Negative for wheezing.   ?Cardiovascular:  Positive for leg swelling. Negative for chest pain and palpitations.  ?Musculoskeletal:  Positive for arthralgias and back pain.  ?Neurological:  Positive for light-headedness (if stands up quickly). Negative for headaches.  ? ?   ?Objective:  ? ?Vitals:  ? 05/09/21 1401  ?BP: 130/86  ?Pulse: 82  ?Temp: 98.6 ?F (37 ?C)  ?SpO2: 93%  ? ?BP Readings from Last 3 Encounters:  ?05/09/21 130/86  ?05/04/21 (!) 166/79  ?04/20/21 (!) 150/78  ? ?Wt Readings from Last 3 Encounters:  ?05/09/21 157 lb 9.6 oz (71.5 kg)  ?03/27/21 161 lb (73 kg)  ?03/09/21 161 lb (73 kg)  ? ?Body mass index is 21.98 kg/m?. ? ?  ?Physical Exam ?Constitutional:   ?   General: He is not in acute  distress. ?   Appearance: Normal appearance. He is not ill-appearing.  ?HENT:  ?   Head: Normocephalic and atraumatic.  ?Eyes:  ?   Conjunctiva/sclera: Conjunctivae normal.  ?Cardiovascular:  ?   Rate and Rhythm: Normal rate and regular rhythm.  ?   Heart sounds: Murmur heard.  ?Pulmonary:  ?   Effort: Pulmonary effort is normal. No respiratory distress.  ?   Breath sounds: Normal breath sounds. No wheezing or rales.  ?Musculoskeletal:  ?   Right lower leg: No edema.  ?   Left lower leg: No edema.  ?Skin: ?   General: Skin is warm and dry.  ?   Findings: No rash.  ?Neurological:  ?   Mental Status: He is alert. Mental status is at baseline.  ?Psychiatric:     ?   Mood and Affect: Mood normal.  ? ?   ? ?Lab Results  ?Component Value Date  ? WBC 5.9 01/30/2021  ? HGB 12.6 (L) 05/04/2021  ? HCT 38.6 (L) 01/30/2021  ? PLT 218 01/30/2021  ? GLUCOSE 97 04/20/2021  ? CHOL 122 09/04/2016  ? TRIG 59.0 09/04/2016  ? HDL 40.40 09/04/2016  ? [place] 70 09/04/2016  ? ALT 9 05/19/2020  ? AST 16 05/19/2020  ? NA 137 04/20/2021  ? K 4.5 04/20/2021  ? CL 107 04/20/2021  ? CREATININE 2.19 (H) 04/20/2021  ? BUN 42 (H) 04/20/2021  ? CO2 25 04/20/2021  ? TSH 1.827 02/02/2018  ? PSA 0.44 07/18/2007  ? INR 1.2 (H) 03/04/2013  ? ? ? ?Assessment & Plan:  ? ? ?See Problem List for Assessment and Plan of chronic medical problems.  ? ? ?

## 2021-05-08 NOTE — Patient Instructions (Addendum)
? ? ?  ? ? ?  Medications changes include :   mirtazapine 7.5 mg nightly for sleep - tr 1/2 of a pill the first night ? ? ?Your prescription(s) have been sent to your pharmacy.  ? ? ?Return in about 6 months (around 11/09/2021) for follow up. ? ?

## 2021-05-09 ENCOUNTER — Ambulatory Visit (INDEPENDENT_AMBULATORY_CARE_PROVIDER_SITE_OTHER): Payer: Medicare Other | Admitting: Internal Medicine

## 2021-05-09 VITALS — BP 130/86 | HR 82 | Temp 98.6°F | Ht 71.0 in | Wt 157.6 lb

## 2021-05-09 DIAGNOSIS — I10 Essential (primary) hypertension: Secondary | ICD-10-CM

## 2021-05-09 DIAGNOSIS — I2510 Atherosclerotic heart disease of native coronary artery without angina pectoris: Secondary | ICD-10-CM

## 2021-05-09 DIAGNOSIS — G6289 Other specified polyneuropathies: Secondary | ICD-10-CM | POA: Diagnosis not present

## 2021-05-09 DIAGNOSIS — G4700 Insomnia, unspecified: Secondary | ICD-10-CM

## 2021-05-09 DIAGNOSIS — N1832 Chronic kidney disease, stage 3b: Secondary | ICD-10-CM | POA: Diagnosis not present

## 2021-05-09 DIAGNOSIS — M1A30X0 Chronic gout due to renal impairment, unspecified site, without tophus (tophi): Secondary | ICD-10-CM

## 2021-05-09 DIAGNOSIS — R609 Edema, unspecified: Secondary | ICD-10-CM

## 2021-05-09 DIAGNOSIS — E538 Deficiency of other specified B group vitamins: Secondary | ICD-10-CM

## 2021-05-09 MED ORDER — MIRTAZAPINE 7.5 MG PO TABS: 7.5000 mg | ORAL_TABLET | Freq: Every day | ORAL | 2 refills | Status: DC

## 2021-05-09 NOTE — Assessment & Plan Note (Signed)
Chronic °Blood pressure well controlled °CMP °Continue amlodipine 5 mg daily °

## 2021-05-09 NOTE — Assessment & Plan Note (Signed)
Chronic ?Primarily bottoms of feet ?Confirmed by EMG ? ?

## 2021-05-09 NOTE — Assessment & Plan Note (Signed)
Chronic ?Stable and controlled ?No recent flares ?Continue allopurinol 100 mg daily ?

## 2021-05-09 NOTE — Assessment & Plan Note (Addendum)
Chronic ?Controlled, Stable ?Continue Lasix 40 mg daily as needed only-takes less than once a month ?

## 2021-05-09 NOTE — Assessment & Plan Note (Signed)
Chronic ?Stable ?Following with nephrology ?

## 2021-05-09 NOTE — Assessment & Plan Note (Signed)
Chronic ?Has tried melatonin and trazodone and both were not effective ?Restoril made him feel wired ?Will try mirtazapine 7.5 mg nightly-first night he will try half a pill and then increase to 1 full pill if he has no side effects ?He will update me after a few days on how this is helping/not helping ?Can try Belsomra ?

## 2021-05-09 NOTE — Assessment & Plan Note (Signed)
Chronic Continue B12 injections monthly 

## 2021-05-09 NOTE — Assessment & Plan Note (Signed)
Chronic ?Following with cardiology ?Continue aspirin 81 mg daily and current blood pressure medications ?

## 2021-05-18 ENCOUNTER — Encounter (HOSPITAL_COMMUNITY)
Admission: RE | Admit: 2021-05-18 | Discharge: 2021-05-18 | Disposition: A | Discharge status: Home / Self Care | Payer: Medicare Other | Source: Ambulatory Visit | Attending: Nephrology | Admitting: Nephrology

## 2021-05-18 VITALS — BP 152/85 | HR 75 | Temp 97.7°F | Resp 16

## 2021-05-18 DIAGNOSIS — N183 Chronic kidney disease, stage 3 unspecified: Secondary | ICD-10-CM | POA: Insufficient documentation

## 2021-05-18 LAB — FERRITIN: Ferritin: 125 ng/mL (ref 24–336)

## 2021-05-18 LAB — RENAL FUNCTION PANEL
Albumin: 4.1 g/dL (ref 3.5–5.0)
Anion gap: 7 (ref 5–15)
BUN: 39 mg/dL — ABNORMAL HIGH (ref 8–23)
CO2: 25 mmol/L (ref 22–32)
Calcium: 9.7 mg/dL (ref 8.9–10.3)
Chloride: 106 mmol/L (ref 98–111)
Creatinine, Ser: 2.2 mg/dL — ABNORMAL HIGH (ref 0.61–1.24)
GFR, Estimated: 27 mL/min — ABNORMAL LOW (ref >60)
Glucose, Bld: 84 mg/dL (ref 70–99)
Phosphorus: 3.1 mg/dL (ref 2.5–4.6)
Potassium: 4.3 mmol/L (ref 3.5–5.1)
Sodium: 138 mmol/L (ref 135–145)

## 2021-05-18 LAB — POCT HEMOGLOBIN-HEMACUE: Hemoglobin: 12.5 g/dL — ABNORMAL LOW (ref 13.0–17.0)

## 2021-05-18 LAB — IRON AND TIBC
Iron: 61 ug/dL (ref 45–182)
Saturation Ratios: 21 % (ref 17.9–39.5)
TIBC: 291 ug/dL (ref 250–450)
UIBC: 230 ug/dL

## 2021-05-18 MED ORDER — EPOETIN ALFA-EPBX 10000 UNIT/ML IJ SOLN: 10000.0000 [IU] | INTRAMUSCULAR | Status: DC

## 2021-05-21 ENCOUNTER — Encounter: Payer: Self-pay | Admitting: Internal Medicine

## 2021-05-25 ENCOUNTER — Ambulatory Visit (INDEPENDENT_AMBULATORY_CARE_PROVIDER_SITE_OTHER): Payer: Medicare Other

## 2021-05-25 DIAGNOSIS — E538 Deficiency of other specified B group vitamins: Secondary | ICD-10-CM | POA: Diagnosis not present

## 2021-05-25 MED ORDER — CYANOCOBALAMIN 1000 MCG/ML IJ SOLN
1000.0000 ug | Freq: Once | INTRAMUSCULAR | Status: AC
  Administered 2021-05-25: 1000 ug via INTRAMUSCULAR

## 2021-05-25 NOTE — Progress Notes (Signed)
Patient here for monthly B12 injection per Dr. Burns. B12 1000 mcg given left IM and patient tolerated injection well today.  

## 2021-05-31 ENCOUNTER — Encounter (HOSPITAL_COMMUNITY)
Admission: RE | Admit: 2021-05-31 | Discharge: 2021-05-31 | Disposition: A | Discharge status: Home / Self Care | Payer: Medicare Other | Source: Ambulatory Visit | Attending: Nephrology | Admitting: Nephrology

## 2021-05-31 VITALS — BP 133/80 | HR 78 | Temp 97.8°F | Resp 18

## 2021-05-31 DIAGNOSIS — N183 Chronic kidney disease, stage 3 unspecified: Secondary | ICD-10-CM

## 2021-05-31 LAB — POCT HEMOGLOBIN-HEMACUE: Hemoglobin: 12.3 g/dL — ABNORMAL LOW (ref 13.0–17.0)

## 2021-05-31 MED ORDER — EPOETIN ALFA-EPBX 10000 UNIT/ML IJ SOLN: 10000.0000 [IU] | INTRAMUSCULAR | Status: DC

## 2021-06-07 ENCOUNTER — Encounter: Payer: Medicare Other | Admitting: Psychology

## 2021-06-12 ENCOUNTER — Ambulatory Visit (INDEPENDENT_AMBULATORY_CARE_PROVIDER_SITE_OTHER): Payer: Medicare Other | Admitting: Cardiovascular Disease

## 2021-06-12 ENCOUNTER — Encounter: Payer: Self-pay | Admitting: Cardiovascular Disease

## 2021-06-12 VITALS — BP 100/60 | HR 69 | Ht 70.0 in | Wt 158.4 lb

## 2021-06-12 DIAGNOSIS — I5032 Chronic diastolic (congestive) heart failure: Secondary | ICD-10-CM

## 2021-06-12 DIAGNOSIS — N1832 Chronic kidney disease, stage 3b: Secondary | ICD-10-CM

## 2021-06-12 DIAGNOSIS — I2510 Atherosclerotic heart disease of native coronary artery without angina pectoris: Secondary | ICD-10-CM

## 2021-06-12 DIAGNOSIS — I350 Nonrheumatic aortic (valve) stenosis: Secondary | ICD-10-CM

## 2021-06-12 DIAGNOSIS — I10 Essential (primary) hypertension: Secondary | ICD-10-CM

## 2021-06-12 NOTE — Progress Notes (Unsigned)
Cardiology Office Note:    Date:  06/12/2021   ID:  [name], DOB August 13, 1926, MRN [id number]  PCP:  [name], MD   [facility] Providers Cardiologist:  [name], MD     Referring MD: [name], MD   Chief Complaint  Patient presents with   Shortness of Breath    History of Present Illness:    [name] is a 88 y.o. male with a hx of coronary artery disease, chronic kidney disease, and aortic stenosis, presenting for follow-up evaluation.  The patient has a history of RCA stenting.  He ultimately was treated with multivessel CABG in 2013 with early graft failure of all of his venous conduits and continued patency of the mammary artery to LAD graft.  He had a nuclear stress test in 2019 demonstrating no significant ischemia.  Comorbid medical conditions include chronic diastolic heart failure, symptomatic bradycardia status post permanent pacemaker, chronic kidney disease stage 3, mixed hyperlipidemia, hypertension, prostate cancer, and chronic anemia.  The patient is here with his wife today.  His primary complaints are fatigue and shortness of breath with activity.  He has become very sedentary.  He is short of breath when he is getting himself situated in bed.  He is short of breath with fairly low level activity.  He has no orthopnea, PND, chest pain, chest pressure, or leg swelling.  States that he has not had to take furosemide in a long time.  Past Medical History:  Diagnosis Date   Acute superficial venous thrombosis of lower extremity    a. RLE after CABG, neg dopp for DVT.   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 S/P stenting to mid RCA, prox PDA 06/1999. b. NSTEMI/CABG x 3 in 10/2011 with LIMA to LAD, SVG to PDA, and SVG to OM1.    Cephalalgia 07/14/2015   CHB (complete heart block) 05/03/2012   Overview:  Status post  complete heart block heart rate 28 bpm, alternating with 2 to one AV block and drug for bradycardia.  Status post pacemaker implantation.status post Medtronic pacemaker implantation the 03/28/2012   Chronic diastolic CHF (congestive heart failure) 02/05/2018   Chronic gouty arthritis    Chronic UTI    a. Followed by Dr. Risa Grill - colonized/asymptomatic - not on abx   CKD (chronic kidney disease)    stage 3, GFR 30-59 ml/min; stable with a creatinine around 1.9-2.0 followed by nephrology.   Constipation 03/01/2015   Symptoms and exam consistent with constipation. Abdominal exam is benign with no evidence of pain or obstruction. Discussed importance of increasing fiber and water intake coupled with physical activity to assist with bowel movements. Continue over-the-counter medication management as needed. Follow-up if symptoms worsen or fail to improve.   Coronary atherosclerosis 11/27/2011   Overview:  Multivessel coronary artery disease recently diagnosed by catheterization 2013 History of stent placement with a heparin-coated stent 2001  Last Assessment & Plan:  Status post coronary bypass grafting.  No recurrent chest pain. Overview:  Overview:  S/P stenting to mid RCA, prox PDA 06/1999;  06/2007 Myoview: negative except for apical thinning, EF 68%, last Myoview August 10, 2008 with n   Degeneration of lumbar intervertebral disc    Degenerative disc disease, cervical, with radiculopathy 60/10/9321   Diastolic dysfunction 55/73/2202   Grade 1 DD on Echo  06/2017   Dyslipidemia    Dysphagia 02/06/2016   3/18 - DG Esophagus:  1. Mild esophageal dysmotility, likely presbyesophagus. 2. No other explanation for patient's symptoms. 3. Small hiatal hernia.  On swallow eval - evidence of cervical spine disease and that was likely contributing   Echocardiogram    Echocardiogram 08/2018: EF 50-55, inf-lat AK, mild LVH, Gr 1 DD, RVSP 47, mild LAE, mild to mod MR, mild AI, mild AS (mean 11)   Epistaxis 12/08/2020    Essential hypertension 07/20/2006   Fatigue 05/18/2017   GERD (gastroesophageal reflux disease)    Gout 05/12/2018   Gouty arthritis of right great toe 09/03/2017   Left toe Injected January 31, 2018    Hyperpigmentation 11/04/2017   Internal hemorrhoids 08/15/2016   Left inguinal hernia 07/01/2019   Lumbar post-laminectomy syndrome 12/12/2017   Moderate aortic regurgitation 07/04/2017   Echo 06/2017:  EF 55-60%, mild LVH, grade 1 DD, mild AS, mod AR, mild MR, mild-mod TR   Myocardial infarction    Neck injury    a. C3-C4 and C4-C5 foraminal narrowing, severe   Numbness of left foot 10/21/2019   Pacemaker 04/10/2012   Medtronic pacemaker  Last Assessment & Plan:  Status post pacemaker implantation for symptomatic bradycardia.  Pacemaker site is slightly tender and erythematous.  A prescription of Keflex was dispensed.  I also asked the patient to closely followup with the EP service regarding his pacemaker placement. 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  This was switched to by mouth amiodarone at the time of the thoracic surgery appointment was discontinued.   Presence of aortocoronary bypass graft 10/24/2011   Overview:  Performed at [facility] 2013 Last Assessment & Plan:  No complication post coronary bypass grafting.   Prostate cancer ([place])    a. 2001 s/p TURP.   Pulmonary nodule    a. felt to be noncancerous.  Status post followup CT scan 4 mm and stable.   Renal artery stenosis    a.  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   Trochanteric bursitis of hip, bilateral 01/10/2017   UGIB (upper gastrointestinal bleed) 02/01/2018   EGD  02/06/18 - mod, non-erosive gastritis   Venous insufficiency of leg 06/05/2010   Vitamin B12 deficiency 08/23/2009   Jan '14  July '14 B12 level  >1500    472   Weakness of both lower extremities 04/09/2020    Past Surgical History:  Procedure Laterality Date   ANTERIOR CHAMBER WASHOUT Left 10/04/2018   Procedure: Anterior Chamber Washout, Vitreous Tap;  Surgeon: Jalene Mullet, MD;  Location: [place];  Service: Ophthalmology;  Laterality: Left;   cardia catherization  07-07-99   CARDIAC SURGERY  10/18/12   open heart surgery   CATARACT EXTRACTION W/ INTRAOCULAR LENS  IMPLANT, BILATERAL  3/205, 06/2013   [facility]   COLONOSCOPY  04/12/07   CORONARY ARTERY BYPASS  GRAFT  10/19/2011   Procedure: CORONARY ARTERY BYPASS GRAFTING (CABG);  Surgeon: Gaye Pollack, MD;  Location: [place];  Service: Open Heart Surgery;  Laterality: N/A;  times three using Left Internal Mammary Artery and Right Greater S[place]henouse Vein Graft harvested Endoscopically   edg  07-17-1994   FLEXIBLE SIGMOIDOSCOPY  11-03-1997   GAS INSERTION Left 10/04/2018   Procedure: Insertion Of Gas;  Surgeon: Jalene Mullet, MD;  Location: [place];  Service: Ophthalmology;  Laterality: Left;   GAS/FLUID EXCHANGE Left 10/04/2018   Procedure: Gas/Fluid Exchange;  Surgeon: Jalene Mullet, MD;  Location: [place];  Service: Ophthalmology;  Laterality: Left;   LEFT HEART CATHETERIZATION WITH CORONARY ANGIOGRAM N/A 10/16/2011   Procedure: LEFT HEART CATHETERIZATION WITH CORONARY ANGIOGRAM;  Surgeon: Peter M Martinique, MD;  Location[facility] C[facility] LAB;  Service: Cardiovascular;  Laterality: N/A;   LEFT HEART CATHETERIZATION WITH CORONARY/GRAFT ANGIOGRAM N/A 03/11/2013    Procedure: LEFT HEART CATHETERIZATION WITH [name];  Surgeon: Blane Ohara, MD;  Location: [facility] CATH LAB;  Service: Cardiovascular;  Laterality: N/A;   lumbar spinal disk and neck fusion surgery     PACEMAKER INSERTION  03/28/12   PPM implanted for mobitz II AV block   PARS PLANA VITRECTOMY Left 10/04/2018   Procedure: PARS PLANA VITRECTOMY 25 GAUGE FOR ENDOPHTHALMITIS WITH INJECTION OF INTRAVITREAL ANTIBIOTIC;  Surgeon: Jalene Mullet, MD;  Location: [place];  Service: Ophthalmology;  Laterality: Left;   peripheral vascular catherization  11-24-03   PERMANENT PACEMAKER INSERTION N/A 03/28/2012   Procedure: PERMANENT PACEMAKER INSERTION;  Surgeon: Thompson Grayer, MD;  Location: [facility] CATH LAB;  Service: Cardiovascular;  Laterality: N/A;   PROSTATECTOMY     renal circulation  10-01-03   s/p ptca     stents     X 2   stress cardiolite  05-04-05   spring 09-negative except for apical thinning, EF 68%    Current Medications: Current Meds  Medication Sig   acetaminophen (TYLENOL) 500 MG tablet Take 500 mg by mouth every 6 (six) hours as needed.   allopurinol (ZYLOPRIM) 100 MG tablet TAKE 1 TABLET(100 MG) BY MOUTH DAILY   amLODipine (NORVASC) 5 MG tablet TAKE 1 TABLET DAILY   Aromatic Inhalants (VICKS VAPOR INHALER IN) Place 1 puff into both nostrils as needed (for congestion).   Ascorbic Acid (VITAMIN C) 1000 MG tablet    aspirin EC 81 MG tablet Take 81 mg by mouth daily.   Calcium Citrate-Vitamin D (CITRACAL + D PO) Take 2 tablets by mouth in the morning and at bedtime.   Carboxymeth-Glycerin-Polysorb (REFRESH OPTIVE MEGA-3 OP) Place 1 drop into both eyes 2 (two) times daily.   Cholecalciferol 25 MCG (1000 UT) capsule Take 1,000 Units by mouth daily.   clobetasol cream (TEMOVATE) 0.05 % Apply topically 2 (two) times daily.   cyanocobalamin (,VITAMIN B-12,) 1000 MCG/ML injection Inject 1,000 mcg into the muscle once. Monthly injection   epoetin alfa-epbx (RETACRIT) 2000 UNIT/ML  injection 2,000 Units every 14 (fourteen) days. Patient states he only gets it when he needs it but not every 2 weeks   folic acid (FOLVITE) 1 MG tablet Take 1 tablet (1 mg total) by mouth daily. Annual appt due in May must see provider for future refills   furosemide (LASIX) 40 MG tablet TAKE 1 TABLET BY MOUTH EVERY DAY (Patient taking differently: as needed.)   halobetasol (ULTRAVATE) 0.05 % cream Apply topically as needed.   loratadine (CLARITIN) 10 MG tablet Take 10 mg by mouth daily as  needed (for seasonal allergies).   mirtazapine (REMERON) 7.5 MG tablet Take 1 tablet (7.5 mg total) by mouth at bedtime.   Multiple Vitamins-Minerals (PRESERVISION AREDS 2 PO) Take by mouth.   nitroGLYCERIN (NITROSTAT) 0.4 MG SL tablet DISSOLVE 1 TABLET UNDER THE TONGUE EVERY 5 MINUTES AS NEEDED FOR CHEST PAIN, MAXIMUM 3 TABLETS   Saline (AYR NASAL MIST ALLERGY/SINUS NA) Place into the nose.   Current Facility-Administered Medications for the 06/12/21 encounter (Office Visit) with [name], MD  Medication   NON FORMULARY 1 application     Allergies:   Aspirin, Lisinopril, Amoxicillin, Atarax [hydroxyzine hcl], Cephalexin, Ciprofloxacin, Clindamycin, Clobetasol, Codeine, Fish allergy, Fluarix [influenza virus vaccine], Haemophilus influenzae, Hydrocodone, Hydrocodone-acetaminophen, Hydroxyzine, Latex, Macrobid [nitrofurantoin macrocrystal], Niacin, Niacin-lovastatin er, Niacin-lovastatin er, Nitrofurantoin, Omeprazole, Other, Tramadol, Vibramycin [doxycycline], Adhesive [tape], Bactrim [sulfamethoxazole-trimethoprim], Colchicine, Gabapentin, and Nortriptyline   Social History   Socioeconomic History   Marital status: Married    Spouse name: [name]   Number of children: 2   Years of education: 13   Highest education level: Some college, no degree  Occupational History   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  married '52 - 3 years, divorced; married '56 - 3 years divorced; married '25-12 yrs - divorced; married '75 -. 1 son '57; 1 daughter - '53; 1 grandchild.  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  Very happily married.  End of life care: yes CPR, no long term mechanical ventilation, no heroic measures.right handed   Right handed   Social Determinants of Health   Financial Resource Strain: Low Risk    Difficulty of Paying Living Expenses: Not hard at all  Food Insecurity: No Food Insecurity   Worried About Charity fundraiser in the Last Year: Never true   Ran Out of Food in the Last Year: Never true  Transportation Needs: No Transportation Needs   Lack of Transportation (Medical): No   Lack of Transportation (Non-Medical): No  Physical Activity: Sufficiently Active   Days of Exercise per Week: 5 days   Minutes of Exercise per Session: 30 min  Stress: No Stress Concern Present   Feeling of Stress : Not at all  Social Connections: Socially Integrated   Frequency of Communication with Friends and Family: More than three times a week   Frequency of Social Gatherings with Friends and Family: Once a week   Attends Religious Services: More than 4 times per year   Active Member of [employer] or Organizations: Yes   Attends Music therapist: More than 4 times per year   Marital Status: Married     Family History: The patient's family history includes Arthritis in his mother; Breast cancer in an other family member; Cancer in his brother; Coronary artery disease in his father;  Heart disease in his brother; Nephritis in his brother; Other in his brother and mother; Prostate cancer in his brother.  There is no history of Diabetes, Colon cancer, or Adrenal disorder.  ROS:   Please see the history of present illness.    All other systems reviewed and are negative.  EKGs/Labs/Other Studies Reviewed:    The following studies were reviewed today: Echo 01-06-2021: 1. Left ventricular ejection fraction, by estimation, is 60 to 65%. The  left ventricle has normal function. The left ventricle has no regional  wall motion abnormalities. There is mild concentric left ventricular  hypertrophy. Left ventricular diastolic  parameters are consistent with Grade I diastolic dysfunction (impaired  relaxation).   2. Right ventricular systolic function is normal. The right ventricular  size is normal. There is mildly elevated pulmonary artery systolic  pressure.   3. Left atrial size was severely dilated.   4. The mitral valve is normal in structure. Mild mitral valve  regurgitation. No evidence of mitral stenosis.   5. Tricuspid valve regurgitation is mild to moderate.   6. Aortic valve gradients unchanged from 06/2020. The aortic valve is  calcified. There is moderate calcification of the aortic valve. There is  moderate thickening of the aortic valve. Aortic valve regurgitation is  mild. Mild aortic valve stenosis. Aortic   valve area, by VTI measures 1.07 cm. Aortic valve mean gradient measures  17.3 mmHg. Aortic valve Vmax measures 2.72 m/s.   7. The inferior vena cava is normal in size with greater than 50%  respiratory variability, suggesting right atrial pressure of 3 mmHg.   EKG:  EKG is not ordered today.    Recent Labs: 01/30/2021: Platelets 218 05/18/2021: BUN 39; Creatinine, Ser 2.20; Potassium 4.3; Sodium 138 05/31/2021: Hemoglobin 12.3  Recent Lipid Panel    Component Value Date/Time   CHOL 122 09/04/2016 1123   TRIG 59.0 09/04/2016 1123   TRIG 107  01/16/2006 1338   HDL 40.40 09/04/2016 1123   CHOLHDL 3 09/04/2016 1123   VLDL 11.8 09/04/2016 1123   LDLCALC 70 09/04/2016 1123     Risk Assessment/Calculations:           Physical Exam:    VS:  BP 100/60   Pulse 69   Ht '5\' 10"'$  (1.778 m)   Wt 158 lb 6.4 oz (71.8 kg)   SpO2 95%   BMI 22.73 kg/m     Wt Readings from Last 3 Encounters:  06/12/21 158 lb 6.4 oz (71.8 kg)  05/09/21 157 lb 9.6 oz (71.5 kg)  03/27/21 161 lb (73 kg)     GEN:  Well nourished, well developed pleasant elderly male in no acute distress HEENT: Normal NECK: No JVD; No carotid bruits LYMPHATICS: No lymphadenopathy CARDIAC: RRR, 3/6 harsh mid peaking systolic crescendo decrescendo murmur at the right upper sternal border, A2 is present RESPIRATORY:  Clear to auscultation without rales, wheezing or rhonchi  ABDOMEN: Soft, non-tender, non-distended MUSCULOSKELETAL:  No edema; No deformity  SKIN: Warm and dry NEUROLOGIC:  Alert and oriented x 3 PSYCHIATRIC:  Normal affect   ASSESSMENT:    1. Nonrheumatic aortic valve stenosis   2. Coronary artery disease involving native coronary artery of native heart without angina pectoris   3. Stage 3b chronic kidney disease ([place])   4. Chronic diastolic CHF (congestive heart failure) ([place])   5. Essential hypertension    PLAN:    In order of problems listed above:  The patient's most recent echocardiogram is reviewed and demonstrates moderate aortic stenosis.  His exam is consistent with moderate aortic stenosis.  I have recommended an echocardiogram in 6  months and a follow-up office visit after his echo is completed.  He understands and we reviewed the cardinal signs of aortic stenosis today to include progressive shortness of breath, chest discomfort, worsening fatigue, or lightheadedness/syncope. No symptoms of angina.  Continues on amlodipine, aspirin. Followed by Dr. Joelyn Oms.  Notes that he has an upcoming appointment at [facility]. Overall appears  stable with multifactorial dyspnea.  No signs of volume overload on his exam.  Blood pressure is well controlled. As above well-controlled blood pressure today.  Continue amlodipine.    Medication Adjustments/Labs and Tests Ordered: Current medicines are reviewed at length with the patient today.  Concerns regarding medicines are outlined above.  Orders Placed This Encounter  Procedures   ECHOCARDIOGRAM COMPLETE   No orders of the defined types were placed in this encounter.   Patient Instructions  Medication Instructions:  Your physician recommends that you continue on your current medications as directed. Please refer to the Current Medication list given to you today.  *If you need a refill on your cardiac medications before your next appointment, please call your pharmacy*   Lab Work: NONE If you have labs (blood work) drawn today and your tests are completely normal, you will receive your results only by: [place] (if you have MyChart) OR A paper copy in the mail If you have any lab test that is abnormal or we need to change your treatment, we will call you to review the results.   Testing/Procedures: ECHO Your physician has requested that you have an echocardiogram. Echocardiography is a painless test that uses sound waves to create images of your heart. It provides your doctor with information about the size and shape of your heart and how well your heart's chambers and valves are working. This procedure takes approximately one hour. There are no restrictions for this procedure.  Follow-Up: At [facility], you and your health needs are our priority.  As part of our continuing mission to provide you with exceptional heart care, we have created designated Provider Care Teams.  These Care Teams include your primary Cardiologist (physician) and Advanced Practice Providers (APPs -  Physician Assistants and Nurse Practitioners) who all work together to provide you with the  care you need, when you need it.  Your next appointment:   6 month(s)  The format for your next appointment:   In Person  Provider:   [name], MD     Important Information About Sugar         Signed, [name], MD  06/12/2021 3:04 PM    [place]

## 2021-06-12 NOTE — Patient Instructions (Signed)
Medication Instructions:  Your physician recommends that you continue on your current medications as directed. Please refer to the Current Medication list given to you today.  *If you need a refill on your cardiac medications before your next appointment, please call your pharmacy*   Lab Work: NONE If you have labs (blood work) drawn today and your tests are completely normal, you will receive your results only by: [place] (if you have MyChart) OR A paper copy in the mail If you have any lab test that is abnormal or we need to change your treatment, we will call you to review the results.   Testing/Procedures: ECHO Your physician has requested that you have an echocardiogram. Echocardiography is a painless test that uses sound waves to create images of your heart. It provides your doctor with information about the size and shape of your heart and how well your heart's chambers and valves are working. This procedure takes approximately one hour. There are no restrictions for this procedure.  Follow-Up: At [facility], you and your health needs are our priority.  As part of our continuing mission to provide you with exceptional heart care, we have created designated Provider Care Teams.  These Care Teams include your primary Cardiologist (physician) and Advanced Practice Providers (APPs -  Physician Assistants and Nurse Practitioners) who all work together to provide you with the care you need, when you need it.  Your next appointment:   6 month(s)  The format for your next appointment:   In Person  Provider:   [name], MD     Important Information About Sugar

## 2021-06-13 ENCOUNTER — Encounter: Payer: Medicare Other | Admitting: Psychology

## 2021-06-14 ENCOUNTER — Encounter (HOSPITAL_COMMUNITY)
Admission: RE | Admit: 2021-06-14 | Discharge: 2021-06-14 | Disposition: A | Discharge status: Home / Self Care | Payer: Medicare Other | Source: Ambulatory Visit | Attending: Nephrology | Admitting: Nephrology

## 2021-06-14 ENCOUNTER — Encounter: Payer: Medicare Other | Admitting: Psychology

## 2021-06-14 VITALS — BP 163/72 | HR 71 | Temp 97.6°F | Resp 18

## 2021-06-14 DIAGNOSIS — N183 Chronic kidney disease, stage 3 unspecified: Secondary | ICD-10-CM | POA: Insufficient documentation

## 2021-06-14 LAB — RENAL FUNCTION PANEL
Albumin: 4 g/dL (ref 3.5–5.0)
Anion gap: 8 (ref 5–15)
BUN: 44 mg/dL — ABNORMAL HIGH (ref 8–23)
CO2: 24 mmol/L (ref 22–32)
Calcium: 9.6 mg/dL (ref 8.9–10.3)
Chloride: 106 mmol/L (ref 98–111)
Creatinine, Ser: 2.14 mg/dL — ABNORMAL HIGH (ref 0.61–1.24)
GFR, Estimated: 28 mL/min — ABNORMAL LOW (ref >60)
Glucose, Bld: 91 mg/dL (ref 70–99)
Phosphorus: 2.9 mg/dL (ref 2.5–4.6)
Potassium: 4.2 mmol/L (ref 3.5–5.1)
Sodium: 138 mmol/L (ref 135–145)

## 2021-06-14 LAB — IRON AND TIBC
Iron: 63 ug/dL (ref 45–182)
Saturation Ratios: 23 % (ref 17.9–39.5)
TIBC: 273 ug/dL (ref 250–450)
UIBC: 210 ug/dL

## 2021-06-14 LAB — FERRITIN: Ferritin: 150 ng/mL (ref 24–336)

## 2021-06-14 MED ORDER — EPOETIN ALFA-EPBX 10000 UNIT/ML IJ SOLN
INTRAMUSCULAR | Status: AC
  Filled 2021-06-14: qty 1

## 2021-06-14 MED ORDER — EPOETIN ALFA-EPBX 10000 UNIT/ML IJ SOLN
10000.0000 [IU] | INTRAMUSCULAR | Status: DC
  Administered 2021-06-14: 10000 [IU] via SUBCUTANEOUS

## 2021-06-15 ENCOUNTER — Encounter (HOSPITAL_COMMUNITY): Payer: Medicare Other

## 2021-06-15 LAB — POCT HEMOGLOBIN-HEMACUE: Hemoglobin: 11.8 g/dL — ABNORMAL LOW (ref 13.0–17.0)

## 2021-06-28 ENCOUNTER — Encounter (HOSPITAL_COMMUNITY): Payer: Medicare Other

## 2021-06-28 ENCOUNTER — Ambulatory Visit (INDEPENDENT_AMBULATORY_CARE_PROVIDER_SITE_OTHER): Payer: Medicare Other

## 2021-06-28 DIAGNOSIS — E538 Deficiency of other specified B group vitamins: Secondary | ICD-10-CM

## 2021-06-28 MED ORDER — CYANOCOBALAMIN 1000 MCG/ML IJ SOLN
1000.0000 ug | Freq: Once | INTRAMUSCULAR | Status: AC
  Administered 2021-06-28: 1000 ug via INTRAMUSCULAR

## 2021-06-28 NOTE — Progress Notes (Signed)
This nurse administered Vitamin B 12 1000 mcg to left deltoid. Ordered by Dr. Quay Burow. Patient tolerated injection well in and instructed to report any adverse reaction to me immediately.

## 2021-07-12 ENCOUNTER — Ambulatory Visit (INDEPENDENT_AMBULATORY_CARE_PROVIDER_SITE_OTHER): Payer: Medicare Other

## 2021-07-12 ENCOUNTER — Ambulatory Visit (HOSPITAL_COMMUNITY)
Admission: RE | Admit: 2021-07-12 | Discharge: 2021-07-12 | Disposition: A | Discharge status: Home / Self Care | Payer: Medicare Other | Source: Ambulatory Visit | Attending: Nephrology | Admitting: Nephrology

## 2021-07-12 VITALS — BP 128/64 | HR 65 | Resp 18

## 2021-07-12 DIAGNOSIS — N183 Chronic kidney disease, stage 3 unspecified: Secondary | ICD-10-CM | POA: Diagnosis present

## 2021-07-12 DIAGNOSIS — I442 Atrioventricular block, complete: Secondary | ICD-10-CM | POA: Diagnosis not present

## 2021-07-12 LAB — IRON AND TIBC
Iron: 80 ug/dL (ref 45–182)
Saturation Ratios: 30 % (ref 17.9–39.5)
TIBC: 266 ug/dL (ref 250–450)
UIBC: 186 ug/dL

## 2021-07-12 LAB — RENAL FUNCTION PANEL
Albumin: 4 g/dL (ref 3.5–5.0)
Anion gap: 12 (ref 5–15)
BUN: 38 mg/dL — ABNORMAL HIGH (ref 8–23)
CO2: 24 mmol/L (ref 22–32)
Calcium: 9.9 mg/dL (ref 8.9–10.3)
Chloride: 103 mmol/L (ref 98–111)
Creatinine, Ser: 2.72 mg/dL — ABNORMAL HIGH (ref 0.61–1.24)
GFR, Estimated: 21 mL/min — ABNORMAL LOW (ref >60)
Glucose, Bld: 89 mg/dL (ref 70–99)
Phosphorus: 3.1 mg/dL (ref 2.5–4.6)
Potassium: 4.1 mmol/L (ref 3.5–5.1)
Sodium: 139 mmol/L (ref 135–145)

## 2021-07-12 LAB — FERRITIN: Ferritin: 128 ng/mL (ref 24–336)

## 2021-07-12 LAB — POCT HEMOGLOBIN-HEMACUE: Hemoglobin: 12 g/dL — ABNORMAL LOW (ref 13.0–17.0)

## 2021-07-12 MED ORDER — EPOETIN ALFA-EPBX 10000 UNIT/ML IJ SOLN: 10000.0000 [IU] | INTRAMUSCULAR | Status: DC

## 2021-07-15 LAB — CUP PACEART REMOTE DEVICE CHECK
Battery Impedance: 1203 Ohm
Battery Remaining Longevity: 58 mo
Battery Voltage: 2.77 V
Brady Statistic AP VP Percent: 1 %
Brady Statistic AP VS Percent: 42 %
Brady Statistic AS VP Percent: 0 %
Brady Statistic AS VS Percent: 56 %
Date Time Interrogation Session: 20230705095716
Implantable Lead Implant Date: 20140321
Implantable Lead Implant Date: 20140321
Implantable Lead Location: 753859
Implantable Lead Location: 753860
Implantable Lead Model: 5076
Implantable Lead Model: 5092
Implantable Pulse Generator Implant Date: 20140321
Lead Channel Impedance Value: 417 Ohm
Lead Channel Impedance Value: 771 Ohm
Lead Channel Pacing Threshold Amplitude: 0.625 V
Lead Channel Pacing Threshold Amplitude: 0.875 V
Lead Channel Pacing Threshold Pulse Width: 0.4 ms
Lead Channel Pacing Threshold Pulse Width: 0.4 ms
Lead Channel Setting Pacing Amplitude: 2 V
Lead Channel Setting Pacing Amplitude: 2.5 V
Lead Channel Setting Pacing Pulse Width: 0.4 ms
Lead Channel Setting Sensing Sensitivity: 5.6 mV

## 2021-07-27 ENCOUNTER — Ambulatory Visit (INDEPENDENT_AMBULATORY_CARE_PROVIDER_SITE_OTHER): Payer: Medicare Other

## 2021-07-27 DIAGNOSIS — E538 Deficiency of other specified B group vitamins: Secondary | ICD-10-CM | POA: Diagnosis not present

## 2021-07-27 MED ORDER — CYANOCOBALAMIN 1000 MCG/ML IJ SOLN
1000.0000 ug | Freq: Once | INTRAMUSCULAR | Status: AC
  Administered 2021-07-27: 1000 ug via INTRAMUSCULAR

## 2021-07-27 NOTE — Progress Notes (Signed)
After obtaining consent, and per orders of Dr. Quay Burow, injection of B12 given by Marrian Salvage. Patient tolerate well, instructed to report any adverse reaction to me immediately.

## 2021-07-28 ENCOUNTER — Ambulatory Visit (INDEPENDENT_AMBULATORY_CARE_PROVIDER_SITE_OTHER): Payer: Medicare Other

## 2021-07-28 ENCOUNTER — Encounter (HOSPITAL_COMMUNITY)
Admission: RE | Admit: 2021-07-28 | Discharge: 2021-07-28 | Disposition: A | Discharge status: Home / Self Care | Payer: Medicare Other | Source: Ambulatory Visit | Attending: Nephrology | Admitting: Nephrology

## 2021-07-28 VITALS — BP 153/84 | HR 70 | Temp 97.8°F | Resp 18

## 2021-07-28 DIAGNOSIS — N184 Chronic kidney disease, stage 4 (severe): Secondary | ICD-10-CM | POA: Diagnosis not present

## 2021-07-28 DIAGNOSIS — Z0000 Encounter for general adult medical examination without abnormal findings: Secondary | ICD-10-CM

## 2021-07-28 DIAGNOSIS — D631 Anemia in chronic kidney disease: Secondary | ICD-10-CM | POA: Diagnosis not present

## 2021-07-28 DIAGNOSIS — N183 Chronic kidney disease, stage 3 unspecified: Secondary | ICD-10-CM | POA: Diagnosis present

## 2021-07-28 LAB — POCT HEMOGLOBIN-HEMACUE: Hemoglobin: 11.6 g/dL — ABNORMAL LOW (ref 13.0–17.0)

## 2021-07-28 MED ORDER — EPOETIN ALFA 10000 UNIT/ML IJ SOLN
10000.0000 [IU] | Freq: Once | INTRAMUSCULAR | Status: AC
  Administered 2021-07-28: 10000 [IU] via SUBCUTANEOUS

## 2021-07-28 MED ORDER — EPOETIN ALFA 10000 UNIT/ML IJ SOLN
INTRAMUSCULAR | Status: AC
  Filled 2021-07-28: qty 1

## 2021-07-28 MED ORDER — EPOETIN ALFA-EPBX 10000 UNIT/ML IJ SOLN: 10000.0000 [IU] | INTRAMUSCULAR | Status: DC

## 2021-07-28 NOTE — Patient Instructions (Signed)
[name] , Thank you for taking time to come for your Medicare Wellness Visit. I appreciate your ongoing commitment to your health goals. Please review the following plan we discussed and let me know if I can assist you in the future.   Screening recommendations/referrals: Colonoscopy: no longer required  Recommended yearly ophthalmology/optometry visit for glaucoma screening and checkup Recommended yearly dental visit for hygiene and checkup  Vaccinations: Influenza vaccine: declined allergy  Pneumococcal vaccine: completed  Tdap vaccine: 04/21/2016 Shingles vaccine: completed     Advanced directives: yes   Conditions/risks identified: none   Next appointment: none   Preventive Care 70 Years and Older, Male Preventive care refers to lifestyle choices and visits with your health care provider that can promote health and wellness. What does preventive care include? A yearly physical exam. This is also called an annual well check. Dental exams once or twice a year. Routine eye exams. Ask your health care provider how often you should have your eyes checked. Personal lifestyle choices, including: Daily care of your teeth and gums. Regular physical activity. Eating a healthy diet. Avoiding tobacco and drug use. Limiting alcohol use. Practicing safe sex. Taking low doses of aspirin every day. Taking vitamin and mineral supplements as recommended by your health care provider. What happens during an annual well check? The services and screenings done by your health care provider during your annual well check will depend on your age, overall health, lifestyle risk factors, and family history of disease. Counseling  Your health care provider may ask you questions about your: Alcohol use. Tobacco use. Drug use. Emotional well-being. Home and relationship well-being. Sexual activity. Eating habits. History of falls. Memory and ability to understand (cognition). Work and  work Statistician. Screening  You may have the following tests or measurements: Height, weight, and BMI. Blood pressure. Lipid and cholesterol levels. These may be checked every 5 years, or more frequently if you are over 61 years old. Skin check. Lung cancer screening. You may have this screening every year starting at age 74 if you have a 30-pack-year history of smoking and currently smoke or have quit within the past 15 years. Fecal occult blood test (FOBT) of the stool. You may have this test every year starting at age 1. Flexible sigmoidoscopy or colonoscopy. You may have a sigmoidoscopy every 5 years or a colonoscopy every 10 years starting at age 46. Prostate cancer screening. Recommendations will vary depending on your family history and other risks. Hepatitis C blood test. Hepatitis B blood test. Sexually transmitted disease (STD) testing. Diabetes screening. This is done by checking your blood sugar (glucose) after you have not eaten for a while (fasting). You may have this done every 1-3 years. Abdominal aortic aneurysm (AAA) screening. You may need this if you are a current or former smoker. Osteoporosis. You may be screened starting at age 79 if you are at high risk. Talk with your health care provider about your test results, treatment options, and if necessary, the need for more tests. Vaccines  Your health care provider may recommend certain vaccines, such as: Influenza vaccine. This is recommended every year. Tetanus, diphtheria, and acellular pertussis (Tdap, Td) vaccine. You may need a Td booster every 10 years. Zoster vaccine. You may need this after age 14. Pneumococcal 13-valent conjugate (PCV13) vaccine. One dose is recommended after age 55. Pneumococcal polysaccharide (PPSV23) vaccine. One dose is recommended after age 40. Talk to your health care provider about which screenings and vaccines you need and  how often you need them. This information is not intended to  replace advice given to you by your health care provider. Make sure you discuss any questions you have with your health care provider. Document Released: 01/21/2015 Document Revised: 09/14/2015 Document Reviewed: 10/26/2014 Elsevier Interactive Patient Education  2017 [place] Prevention in the Home Falls can cause injuries. They can happen to people of all ages. There are many things you can do to make your home safe and to help prevent falls. What can I do on the outside of my home? Regularly fix the edges of walkways and driveways and fix any cracks. Remove anything that might make you trip as you walk through a door, such as a raised step or threshold. Trim any bushes or trees on the path to your home. Use bright outdoor lighting. Clear any walking paths of anything that might make someone trip, such as rocks or tools. Regularly check to see if handrails are loose or broken. Make sure that both sides of any steps have handrails. Any raised decks and porches should have guardrails on the edges. Have any leaves, snow, or ice cleared regularly. Use sand or salt on walking paths during winter. Clean up any spills in your garage right away. This includes oil or grease spills. What can I do in the bathroom? Use night lights. Install grab bars by the toilet and in the tub and shower. Do not use towel bars as grab bars. Use non-skid mats or decals in the tub or shower. If you need to sit down in the shower, use a plastic, non-slip stool. Keep the floor dry. Clean up any water that spills on the floor as soon as it happens. Remove soap buildup in the tub or shower regularly. Attach bath mats securely with double-sided non-slip rug tape. Do not have throw rugs and other things on the floor that can make you trip. What can I do in the bedroom? Use night lights. Make sure that you have a light by your bed that is easy to reach. Do not use any sheets or blankets that are too big for  your bed. They should not hang down onto the floor. Have a firm chair that has side arms. You can use this for support while you get dressed. Do not have throw rugs and other things on the floor that can make you trip. What can I do in the kitchen? Clean up any spills right away. Avoid walking on wet floors. Keep items that you use a lot in easy-to-reach places. If you need to reach something above you, use a strong step stool that has a grab bar. Keep electrical cords out of the way. Do not use floor polish or wax that makes floors slippery. If you must use wax, use non-skid floor wax. Do not have throw rugs and other things on the floor that can make you trip. What can I do with my stairs? Do not leave any items on the stairs. Make sure that there are handrails on both sides of the stairs and use them. Fix handrails that are broken or loose. Make sure that handrails are as long as the stairways. Check any carpeting to make sure that it is firmly attached to the stairs. Fix any carpet that is loose or worn. Avoid having throw rugs at the top or bottom of the stairs. If you do have throw rugs, attach them to the floor with carpet tape. Make sure that you have  a light switch at the top of the stairs and the bottom of the stairs. If you do not have them, ask someone to add them for you. What else can I do to help prevent falls? Wear shoes that: Do not have high heels. Have rubber bottoms. Are comfortable and fit you well. Are closed at the toe. Do not wear sandals. If you use a stepladder: Make sure that it is fully opened. Do not climb a closed stepladder. Make sure that both sides of the stepladder are locked into place. Ask someone to hold it for you, if possible. Clearly mark and make sure that you can see: Any grab bars or handrails. First and last steps. Where the edge of each step is. Use tools that help you move around (mobility aids) if they are needed. These  include: Canes. Walkers. Scooters. Crutches. Turn on the lights when you go into a dark area. Replace any light bulbs as soon as they burn out. Set up your furniture so you have a clear path. Avoid moving your furniture around. If any of your floors are uneven, fix them. If there are any pets around you, be aware of where they are. Review your medicines with your doctor. Some medicines can make you feel dizzy. This can increase your chance of falling. Ask your doctor what other things that you can do to help prevent falls. This information is not intended to replace advice given to you by your health care provider. Make sure you discuss any questions you have with your health care provider. Document Released: 10/21/2008 Document Revised: 06/02/2015 Document Reviewed: 01/29/2014 Elsevier Interactive Patient Education  2017 [employer].

## 2021-07-28 NOTE — Progress Notes (Signed)
Subjective:   [name] is a 88 y.o. male who presents for an Subsequent  Medicare Annual Wellness Visit.   I connected with [name]  today by telephone and verified that I am speaking with the correct person using two identifiers. Location patient: home Location provider: work Persons participating in the virtual visit: patient, provider.   I discussed the limitations, risks, security and privacy concerns of performing an evaluation and management service by telephone and the availability of in person appointments. I also discussed with the patient that there may be a patient responsible charge related to this service. The patient expressed understanding and verbally consented to this telephonic visit.    Interactive audio and video telecommunications were attempted between this provider and patient, however failed, due to patient having technical difficulties OR patient did not have access to video capability.  We continued and completed visit with audio only.    Review of Systems     Cardiac Risk Factors include: advanced age (>61mn, >>37women);male gender     Objective:    Today's Vitals   There is no height or weight on file to calculate BMI.     07/28/2021   10:40 AM 01/30/2021    4:43 PM 07/19/2020   11:19 AM 05/23/2020   11:50 AM 12/22/2019    2:08 PM 03/05/2019    2:24 PM 12/17/2018    2:04 PM  Advanced Directives  Does Patient Have a Medical Advance Directive? Yes Yes Yes No Yes Yes Yes  Type of AParamedicof AFriendlyLiving will HOlneyLiving will [place]ckermanLiving will   [place]ena Park[place] will Living will;Healthcare Power of Attorney  Does patient want to make changes to medical advance directive?   No - Patient declined      Copy of HAnnain Chart? No - copy requested  No - copy requested        Current Medications (verified) Outpatient  Encounter Medications as of 07/28/2021  Medication Sig   acetaminophen (TYLENOL) 500 MG tablet Take 500 mg by mouth every 6 (six) hours as needed.   allopurinol (ZYLOPRIM) 100 MG tablet TAKE 1 TABLET(100 MG) BY MOUTH DAILY   amLODipine (NORVASC) 5 MG tablet TAKE 1 TABLET DAILY   Aromatic Inhalants (VICKS VAPOR INHALER IN) Place 1 puff into both nostrils as needed (for congestion).   Ascorbic Acid (VITAMIN C) 1000 MG tablet    aspirin EC 81 MG tablet Take 81 mg by mouth daily.   Calcium Citrate-Vitamin D (CITRACAL + D PO) Take 2 tablets by mouth in the morning and at bedtime.   Carboxymeth-Glycerin-Polysorb (REFRESH OPTIVE MEGA-3 OP) Place 1 drop into both eyes 2 (two) times daily.   Cholecalciferol 25 MCG (1000 UT) capsule Take 1,000 Units by mouth daily.   clobetasol cream (TEMOVATE) 0.05 % Apply topically 2 (two) times daily.   cyanocobalamin (,VITAMIN B-12,) 1000 MCG/ML injection Inject 1,000 mcg into the muscle once. Monthly injection   Cyanocobalamin (VITAMIN B12) 1000 MCG TBCR    epoetin alfa-epbx (RETACRIT) 2000 UNIT/ML injection 2,000 Units every 14 (fourteen) days. Patient states he only gets it when he needs it but not every 2 weeks   folic acid (FOLVITE) 1 MG tablet Take 1 tablet (1 mg total) by mouth daily. Annual appt due in May must see provider for future refills   furosemide (LASIX) 40 MG tablet TAKE 1 TABLET BY MOUTH EVERY DAY (Patient taking differently:  as needed.)   halobetasol (ULTRAVATE) 0.05 % cream Apply topically as needed.   loratadine (CLARITIN) 10 MG tablet Take 10 mg by mouth daily as needed (for seasonal allergies).   mirtazapine (REMERON) 7.5 MG tablet Take 1 tablet (7.5 mg total) by mouth at bedtime.   Multiple Vitamins-Minerals (PRESERVISION AREDS 2 PO) Take by mouth.   nitroGLYCERIN (NITROSTAT) 0.4 MG SL tablet DISSOLVE 1 TABLET UNDER THE TONGUE EVERY 5 MINUTES AS NEEDED FOR CHEST PAIN, MAXIMUM 3 TABLETS   Saline (AYR NASAL MIST ALLERGY/SINUS NA) Place into the  nose.   Facility-Administered Encounter Medications as of 07/28/2021  Medication   NON FORMULARY 1 application    Allergies (verified) Aspirin, Lisinopril, Amoxicillin, Atarax [hydroxyzine hcl], Cephalexin, Ciprofloxacin, Clindamycin, Clobetasol, Codeine, Fish allergy, Fluarix [influenza virus vaccine], Haemophilus influenzae, Hydrocodone, Hydrocodone-acetaminophen, Hydroxyzine, Latex, Macrobid [nitrofurantoin macrocrystal], Niacin, Niacin-lovastatin er, Niacin-lovastatin er, Nitrofurantoin, Omeprazole, Other, Tramadol, Vibramycin [doxycycline], Adhesive [tape], Bactrim [sulfamethoxazole-trimethoprim], Colchicine, Gabapentin, and Nortriptyline   History: Past Medical History:  Diagnosis Date   Acute superficial venous thrombosis of lower extremity    a. RLE after CABG, neg dopp for DVT.   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 S/P stenting to mid RCA, prox PDA 06/1999. b. NSTEMI/CABG x 3 in 10/2011 with LIMA to LAD, SVG to PDA, and SVG to OM1.    Cephalalgia 07/14/2015   CHB (complete heart block) 05/03/2012   Overview:  Status post complete heart block heart rate 28 bpm, alternating with 2 to one AV block and drug for bradycardia.  Status post pacemaker implantation.status post Medtronic pacemaker implantation the 03/28/2012   Chronic diastolic CHF (congestive heart failure) 02/05/2018   Chronic gouty arthritis    Chronic UTI    a. Followed by Dr. Risa Grill - colonized/asymptomatic - not on abx   CKD (chronic kidney disease)    stage 3, GFR 30-59 ml/min; stable with a creatinine around 1.9-2.0 followed by nephrology.   Constipation 03/01/2015   Symptoms and exam consistent with constipation. Abdominal exam is benign with no evidence of pain or obstruction. Discussed importance of increasing fiber and water intake coupled with physical activity to assist with bowel  movements. Continue over-the-counter medication management as needed. Follow-up if symptoms worsen or fail to improve.   Coronary atherosclerosis 11/27/2011   Overview:  Multivessel coronary artery disease recently diagnosed by catheterization 2013 History of stent placement with a heparin-coated stent 2001  Last Assessment & Plan:  Status post coronary bypass grafting.  No recurrent chest pain. Overview:  Overview:  S/P stenting to mid RCA, prox PDA 06/1999;  06/2007 Myoview: negative except for apical thinning, EF 68%, last Myoview August 10, 2008 with n   Degeneration of lumbar intervertebral disc    Degenerative disc disease, cervical, with radiculopathy 02/72/5366   Diastolic dysfunction 44/03/4740   Grade 1 DD on Echo 06/2017   Dyslipidemia    Dysphagia 02/06/2016   3/18 - DG Esophagus:  1. Mild esophageal dysmotility, likely presbyesophagus. 2. No other explanation for patient's symptoms. 3. Small hiatal hernia.  On swallow eval - evidence of cervical spine disease and that was likely contributing   Echocardiogram    Echocardiogram 08/2018: EF 50-55, inf-lat AK, mild LVH, Gr 1 DD, RVSP 47, mild LAE, mild to mod MR, mild AI, mild AS (mean 11)   Epistaxis 12/08/2020   Essential hypertension 07/20/2006  Fatigue 05/18/2017   GERD (gastroesophageal reflux disease)    Gout 05/12/2018   Gouty arthritis of right great toe 09/03/2017   Left toe Injected January 31, 2018    Hyperpigmentation 11/04/2017   Internal hemorrhoids 08/15/2016   Left inguinal hernia 07/01/2019   Lumbar post-laminectomy syndrome 12/12/2017   Moderate aortic regurgitation 07/04/2017   Echo 06/2017:  EF 55-60%, mild LVH, grade 1 DD, mild AS, mod AR, mild MR, mild-mod TR   Myocardial infarction    Neck injury    a. C3-C4 and C4-C5 foraminal narrowing, severe   Numbness of left foot 10/21/2019   Pacemaker 04/10/2012   Medtronic pacemaker  Last Assessment & Plan:  Status post pacemaker implantation for symptomatic  bradycardia.  Pacemaker site is slightly tender and erythematous.  A prescription of Keflex was dispensed.  I also asked the patient to closely followup with the EP service regarding his pacemaker placement. 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  This was switched to by mouth amiodarone at the time of the thoracic surgery appointment was discontinued.   Presence of aortocoronary bypass graft 10/24/2011   Overview:  Performed at [facility] 2013 Last Assessment & Plan:  No complication post coronary bypass grafting.   Prostate cancer ([place])    a. 2001 s/p TURP.   Pulmonary nodule    a. felt to be noncancerous.  Status post followup CT scan 4 mm and stable.   Renal artery stenosis    a. 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   Trochanteric bursitis of hip, bilateral 01/10/2017   UGIB (upper gastrointestinal bleed) 02/01/2018   EGD  02/06/18 - mod, non-erosive gastritis   Venous insufficiency of leg 06/05/2010   Vitamin B12 deficiency 08/23/2009   Jan '14  July '14 B12 level  >1500    472   Weakness of both lower extremities 04/09/2020   Past Surgical History:  Procedure Laterality Date   ANTERIOR CHAMBER WASHOUT Left 10/04/2018   Procedure: Anterior Chamber Washout, Vitreous Tap;  Surgeon: Jalene Mullet, MD;  Location: [place];  Service: Ophthalmology;   Laterality: Left;   cardia catherization  07-07-99   CARDIAC SURGERY  10/18/12   open heart surgery   CATARACT EXTRACTION W/ INTRAOCULAR LENS  IMPLANT, BILATERAL  3/205, 06/2013   m[facility]   COLONOSCOPY  04/12/07   CORONARY ARTERY BYPASS GRAFT  10/19/2011   Procedure: CORONARY ARTERY BYPASS GRAFTING (CABG);  Surgeon: Gaye Pollack, MD;  Location: [place];  Service: Open Heart Surgery;  Laterality: N/A;  times three using Left Internal Mammary Artery and Right Greater S[place]henouse Vein Graft harvested Endoscopically   edg  07-17-1994   FLEXIBLE SIGMOIDOSCOPY  11-03-1997   GAS INSERTION Left 10/04/2018   Procedure: Insertion Of Gas;  Surgeon: Jalene Mullet, MD;  Location: [place];  Service: Ophthalmology;  Laterality: Left;   GAS/FLUID EXCHANGE Left 10/04/2018  Procedure: Gas/Fluid Exchange;  Surgeon: Jalene Mullet, MD;  Location: [place];  Service: Ophthalmology;  Laterality: Left;   LEFT HEART CATHETERIZATION WITH CORONARY ANGIOGRAM N/A 10/16/2011   Procedure: LEFT HEART CATHETERIZATION WITH CORONARY ANGIOGRAM;  Surgeon: Peter M Martinique, MD;  Location: [facility] CATH LAB;  Service: Cardiovascular;  Laterality: N/A;   LEFT HEART CATHETERIZATION WITH CORONARY/GRAFT ANGIOGRAM N/A 03/11/2013   Procedure: LEFT HEART CATHETERIZATION WITH [name];  Surgeon: Blane Ohara, MD;  Location: [facility] CATH LAB;  Service: Cardiovascular;  Laterality: N/A;   lumbar spinal disk and neck fusion surgery     PACEMAKER INSERTION  03/28/12   PPM implanted for mobitz II AV block   PARS PLANA VITRECTOMY Left 10/04/2018   Procedure: PARS PLANA VITRECTOMY 25 GAUGE FOR ENDOPHTHALMITIS WITH INJECTION OF INTRAVITREAL ANTIBIOTIC;  Surgeon: Jalene Mullet, MD;  Location: [place];  Service: Ophthalmology;  Laterality: Left;   peripheral vascular catherization  11-24-03   PERMANENT PACEMAKER INSERTION N/A 03/28/2012   Procedure: PERMANENT PACEMAKER INSERTION;  Surgeon: Thompson Grayer, MD;  [facility];  Service:  Cardiovascular;  Laterality: N/A;   PROSTATECTOMY     renal circulation  10-01-03   s/p ptca     stents     X 2   stress cardiolite  05-04-05   spring 09-negative except for apical thinning, EF 68%   Family History  Problem Relation Age of Onset   Coronary artery disease Father        died @ 14   Other Mother        cerebral hemorrhage - died @ 9   Arthritis Mother    Cancer Brother        Bladder   Prostate cancer Brother    Nephritis Brother        died @ age 24.   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  married '52 - 3 years, divorced; married '56 - 3 years divorced; married '8-12 yrs - divorced; married '75 -. 1 son '57; 1 daughter - '53; 1 grandchild.  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  Very happily married.  End of life care: yes CPR, no long term mechanical ventilation, no heroic measures.right handed   Right handed   Social Determinants of Health   Financial Resource Strain: Low Risk  (  07/28/2021)   Overall Financial Resource Strain (CARDIA)    Difficulty of  Paying Living Expenses: Not hard at all  Food Insecurity: No Food Insecurity (07/28/2021)   Hunger Vital Sign    Worried About Running Out of Food in the Last Year: Never true    Ran Out of Food in the Last Year: Never true  Transportation Needs: No Transportation Needs (07/28/2021)   PRAPARE - Hydrologist (Medical): No    Lack of Transportation (Non-Medical): No  Physical Activity: Insufficiently Active (07/28/2021)   Exercise Vital Sign    Days of Exercise per Week: 2 days    Minutes of Exercise per Session: 20 min  Stress: No Stress Concern Present (07/28/2021)   [place]    Feeling of Stress : Not at all  Social Connections: [place] (07/28/2021)   Social Connection and Isolation Panel [NHANES]    Frequency of Communication with Friends and Family: Three times a week    Frequency of Social Gatherings with Friends and Family: Three times a week    Attends Religious Services: More than 4 times per year    Active Member of Clubs or Organizations: Yes    Attends Archivist Meetings: 1 to 4 times per year    Marital Status: Married    Tobacco Counseling Counseling given: Not Answered   Clinical Intake:  Pre-visit preparation completed: Yes  Pain : No/denies pain     Nutritional Risks: None Diabetes: No  How often do you need to have someone help you when you read instructions, pamphlets, or other written materials from your doctor or pharmacy?: 1 - Never What is the last grade level you completed in school?: college  Diabetic?no   Interpreter Needed?: No  Information entered by :: L.[email or web address],LPN   Activities of Daily Living    07/28/2021   10:45 AM  In your present state of health, do you have any difficulty performing the following activities:  Hearing? 0  Vision? 0  Difficulty concentrating or making decisions? 0  Walking or climbing stairs? 0   Dressing or bathing? 0  Doing errands, shopping? 0  Preparing Food and eating ? N  Using the Toilet? N  In the past six months, have you accidently leaked urine? N  Do you have problems with loss of bowel control? N  Managing your Medications? N  Managing your Finances? N  Housekeeping or managing your Housekeeping? N    Patient Care Team: [name], MD as PCP - General (Internal Medicine) [name], MD as PCP - Cardiology (Cardiology) Rana Snare, MD (Inactive) (Urology) Edrick Oh, MD (Nephrology) Allyn Kenner, MD (Dermatology) Luberta Mutter, MD (Ophthalmology) Latanya Maudlin, MD (Orthopedic Surgery) Pieter Partridge, DO as Consulting Physician (Neurology) Suella Broad, MD as Consulting Physician (Physical Medicine and Rehabilitation) [name] Campbell, MD as Consulting Physician (Gastroenterology)  Indicate any recent Medical Services you may have received from other than [facility] providers in the past year (date may be approximate).     Assessment:   This is a routine wellness examination for [name].  Hearing/Vision screen Vision Screening - Comments:: Annual eye exams wear glasses   Dietary issues and exercise activities discussed: Current Exercise Habits: Home exercise routine, Type of exercise: walking, Time (Minutes): 30, Frequency (Times/Week): 3, Weekly Exercise (Minutes/Week): 90, Intensity: Mild, Exercise limited by: orthopedic condition(s);neurologic condition(s)   Goals Addressed   None    Depression Screen  07/28/2021   10:41 AM 07/28/2021   10:39 AM 07/28/2021   10:38 AM 05/09/2021    2:22 PM 07/19/2020   12:08 PM 04/09/2020    8:18 AM 03/24/2019    2:04 PM  PHQ 2/9 Scores  PHQ - 2 Score 0 0 0 1 0 3 0  PHQ- 9 Score      9     Fall Risk    07/28/2021   10:41 AM 05/09/2021    2:21 PM 07/19/2020   12:07 PM 07/15/2020   11:00 AM 12/22/2019    2:07 PM  Fall Risk   Falls in the past year? 0 0 0 0 0  Number falls in past yr: 0 0 0  0  Injury  with Fall? 0 0 0  0  Risk for fall due to :  No Fall Risks No Fall Risks    Follow up Falls evaluation completed;Education provided Falls evaluation completed Falls evaluation completed      [place]:  Any stairs in or around the home? No  If so, are there any without handrails? No  Home free of loose throw rugs in walkways, pet beds, electrical cords, etc? Yes  Adequate lighting in your home to reduce risk of falls? Yes   ASSISTIVE DEVICES UTILIZED TO PREVENT FALLS:  Life alert? Yes  Use of a cane, walker or w/c? Yes  Grab bars in the bathroom? Yes  Shower chair or bench in shower? Yes  Elevated toilet seat or a handicapped toilet? Yes     Cognitive Function:Normal cognitive status assessed by telephone conversation  by this Nurse Health Advisor. No abnormalities found.      08/02/2016   11:35 AM  MMSE - Mini Mental State Exam  Orientation to time 5  Orientation to Place 5  Registration 3  Attention/ Calculation 5  Recall 2  Language- name 2 objects 2  Language- repeat 1  Language- follow 3 step command 3  Language- read & follow direction 1  Write a sentence 1  Copy design 1  Total score 29        Immunizations Immunization History  Administered Date(s) Administered   Fluad Quad(high Dose 65+) 09/17/2018, 09/24/2019, 09/22/2020   H1N1 04/20/2008   Influenza Split 09/13/2011   Influenza Whole 10/12/2007, 11/18/2008, 09/15/2009, 09/08/2010   Influenza, High Dose Seasonal PF 09/08/2015, 10/10/2016, 09/11/2017   Influenza,inj,Quad PF,6+ Mos 09/16/2012, 08/26/2013, 08/31/2014   Influenza,inj,quad, With Preservative 09/25/2018   Influenza-Unspecified 09/12/2016   MMR 04/21/2010   PFIZER(Purple Top)SARS-COV-2 Vaccination 01/30/2019, 02/19/2019, 10/16/2019   Pneumococcal Conjugate-13 02/04/2013   Pneumococcal Polysaccharide-23 02/21/2006, 08/31/2014   Pneumococcal-Unspecified 08/23/2014   Td 02/21/2006   Tdap 04/11/2016    Zoster Recombinat (Shingrix) 08/22/2016, 12/05/2016   Zoster, Live 08/21/2005    TDAP status: Up to date  Flu Vaccine status: Declined, Education has been provided regarding the importance of this vaccine but patient still declined. Advised may receive this vaccine at local pharmacy or Health Dept. Aware to provide a copy of the vaccination record if obtained from local pharmacy or Health Dept. Verbalized acceptance and understanding.  Pneumococcal vaccine status: Up to date  Covid-19 vaccine status: Completed vaccines  Qualifies for Shingles Vaccine? Yes   Zostavax completed Yes   Shingrix Completed?: Yes  Screening Tests Health Maintenance  Topic Date Due   COVID-19 Vaccine (4 - Booster for Pfizer series) 12/11/2019   INFLUENZA VACCINE  08/08/2021   TETANUS/TDAP  04/12/2026   Pneumonia  Vaccine 58+ Years old  Completed   Zoster Vaccines- Shingrix  Completed   HPV VACCINES  Aged Out    Health Maintenance  Health Maintenance Due  Topic Date Due   COVID-19 Vaccine (4 - Booster for Pfizer series) 12/11/2019    Colorectal cancer screening: No longer required.   Lung Cancer Screening: (Low Dose CT Chest recommended if Age 87-80 years, 30 pack-year currently smoking OR have quit w/in 15years.) does not qualify.   Lung Cancer Screening Referral: n/a  Additional Screening:  Hepatitis C Screening: does not qualify;  Vision Screening: Recommended annual ophthalmology exams for early detection of glaucoma and other disorders of the eye. Is the patient up to date with their annual eye exam?  Yes  Who is the provider or what is the name of the office in which the patient attends annual eye exams? Dr.McQuen  If pt is not established with a provider, would they like to be referred to a provider to establish care? No .   Dental Screening: Recommended annual dental exams for proper oral hygiene  Community Resource Referral / Chronic Care Management: CRR required this visit?  No    CCM required this visit?  No      Plan:     I have personally reviewed and noted the following in the patient's chart:   Medical and social history Use of alcohol, tobacco or illicit drugs  Current medications and supplements including opioid prescriptions. Patient is not currently taking opioid prescriptions. Functional ability and status Nutritional status Physical activity Advanced directives List of other physicians Hospitalizations, surgeries, and ER visits in previous 12 months Vitals Screenings to include cognitive, depression, and falls Referrals and appointments  In addition, I have reviewed and discussed with patient certain preventive protocols, quality metrics, and best practice recommendations. A written personalized care plan for preventive services as well as general preventive health recommendations were provided to patient.     Randel Pigg, LPN   04/19/9045   Nurse Notes: none

## 2021-08-01 ENCOUNTER — Encounter: Payer: Self-pay | Admitting: Internal Medicine

## 2021-08-01 NOTE — Progress Notes (Unsigned)
Subjective:    Patient ID: [name], male    DOB: [birth year], 88 y.o.   MRN: [id number]     HPI [name] is here for follow up of his recent blood work.   Neuropathy-has had neuropathy in bilateral legs for years.  Over the years it has gotten worse.  His legs continue to bother him.  He states at 1 point it was his entire legs and sometimes it is from his knees down and now he just feels it in the feet.  Sometimes when he is walking the feet when he hurt in all areas of the foot.  He has tried medications for this in the past.  He has tried some massaging.  He is exercising and working on his muscle strength in his legs.  This is affecting his balance.  Can get tired extremely easily.    Chronic back pain and hip pain-following with Ortho hip injections.  Injections in the hip did help, injections of the back did not help.  Lower spine injections  He is exercising 2-3 times a week.    He still has the forgetfulness, chills and hot flashes that started after he received the 1 injection for his prostate cancer.  He was advised these would go away, but they never did.  He only had 1 injection and has not had any more.  Shortness of breath-chronic. Shortness of breath has been going on for years and it has gotten a little worse.  He has seen pulmonary in the past and last saw them a year ago.  His pulmonary function was not thought to be contributing to his shortness of breath.  He does follow with cardiology and is scheduled for an echocardiogram and follow-up with Dr. Burt Knack in December.  He feels his shortness of breath is likely multifactorial.  He does follow with [facility] and has chronic kidney disease and chronic anemia.  Both of which are likely contributing.  When he has increased edema he will take the Lasix.  Lasix did not help with his shortness of breath..        Medications and allergies reviewed with patient and updated if  appropriate.  Current Outpatient Medications on File Prior to Visit  Medication Sig Dispense Refill   acetaminophen (TYLENOL) 500 MG tablet Take 500 mg by mouth every 6 (six) hours as needed.     allopurinol (ZYLOPRIM) 100 MG tablet TAKE 1 TABLET(100 MG) BY MOUTH DAILY 90 tablet 1   amLODipine (NORVASC) 5 MG tablet TAKE 1 TABLET DAILY 90 tablet 3   Aromatic Inhalants (VICKS VAPOR INHALER IN) Place 1 puff into both nostrils as needed (for congestion).     Ascorbic Acid (VITAMIN C) 1000 MG tablet      aspirin EC 81 MG tablet Take 81 mg by mouth daily.     Calcium Citrate-Vitamin D (CITRACAL + D PO) Take 2 tablets by mouth in the morning and at bedtime.     Carboxymeth-Glycerin-Polysorb (REFRESH OPTIVE MEGA-3 OP) Place 1 drop into both eyes 2 (two) times daily.     Cholecalciferol 25 MCG (1000 UT) capsule Take 1,000 Units by mouth daily.     clobetasol cream (TEMOVATE) 0.05 % Apply topically 2 (two) times daily.     cyanocobalamin (,VITAMIN B-12,) 1000 MCG/ML injection Inject 1,000 mcg into the muscle once. Monthly injection     Cyanocobalamin (VITAMIN B12) 1000 MCG TBCR      epoetin alfa-epbx (RETACRIT) 2000  UNIT/ML injection 2,000 Units every 14 (fourteen) days. Patient states he only gets it when he needs it but not every 2 weeks     folic acid (FOLVITE) 1 MG tablet Take 1 tablet (1 mg total) by mouth daily. Annual appt due in May must see provider for future refills 90 tablet 0   furosemide (LASIX) 40 MG tablet TAKE 1 TABLET BY MOUTH EVERY DAY (Patient taking differently: as needed.) 90 tablet 0   halobetasol (ULTRAVATE) 0.05 % cream Apply topically as needed.     loratadine (CLARITIN) 10 MG tablet Take 10 mg by mouth daily as needed (for seasonal allergies).     mirtazapine (REMERON) 7.5 MG tablet Take 1 tablet (7.5 mg total) by mouth at bedtime. 30 tablet 2   Multiple Vitamins-Minerals (PRESERVISION AREDS 2 PO) Take by mouth.     nitroGLYCERIN (NITROSTAT) 0.4 MG SL tablet DISSOLVE 1 TABLET  UNDER THE TONGUE EVERY 5 MINUTES AS NEEDED FOR CHEST PAIN, MAXIMUM 3 TABLETS 100 tablet 1   Saline (AYR NASAL MIST ALLERGY/SINUS NA) Place into the nose.     Vitamin D, Ergocalciferol, (DRISDOL) 1.25 MG (50000 UNIT) CAPS capsule Take by mouth.     Current Facility-Administered Medications on File Prior to Visit  Medication Dose Route Frequency Provider Last Rate Last Admin   NON FORMULARY 1 application  1 application  Topical PRN Landis Martins, DPM         Review of Systems  Constitutional:  Positive for chills.  Respiratory:  Positive for shortness of breath.   Neurological:  Positive for numbness.       Objective:   Vitals:   08/02/21 1600  BP: 130/68  Pulse: 77  Temp: 98.5 F (36.9 C)  SpO2: 94%   BP Readings from Last 3 Encounters:  08/02/21 130/68  07/28/21 (!) 153/84  07/12/21 128/64   Wt Readings from Last 3 Encounters:  08/02/21 156 lb 6 oz (70.9 kg)  06/12/21 158 lb 6.4 oz (71.8 kg)  05/09/21 157 lb 9.6 oz (71.5 kg)   Body mass index is 22.44 kg/m.    Physical Exam Constitutional:      General: He is not in acute distress.    Appearance: Normal appearance. He is not ill-appearing.  HENT:     Head: Normocephalic and atraumatic.  Eyes:     Conjunctiva/sclera: Conjunctivae normal.  Cardiovascular:     Rate and Rhythm: Normal rate and regular rhythm.     Heart sounds: Normal heart sounds. No murmur heard. Pulmonary:     Effort: Pulmonary effort is normal. No respiratory distress.     Breath sounds: Normal breath sounds. No wheezing or rales.  Musculoskeletal:     Right lower leg: Edema (Trace) present.     Left lower leg: Edema (Trace) present.  Skin:    General: Skin is warm and dry.     Findings: No rash.  Neurological:     Mental Status: He is alert. Mental status is at baseline.  Psychiatric:        Mood and Affect: Mood normal.        Lab Results  Component Value Date   WBC 5.9 01/30/2021   HGB 11.6 (L) 07/28/2021   HCT 38.6 (L)  01/30/2021   PLT 218 01/30/2021   GLUCOSE 89 07/12/2021   CHOL 122 09/04/2016   TRIG 59.0 09/04/2016   HDL 40.40 09/04/2016   LDLCALC 70 09/04/2016   ALT 9 05/19/2020   AST 16 05/19/2020  NA 139 07/12/2021   K 4.1 07/12/2021   CL 103 07/12/2021   CREATININE 2.72 (H) 07/12/2021   BUN 38 (H) 07/12/2021   CO2 24 07/12/2021   TSH 1.827 02/02/2018   PSA 0.44 07/18/2007   INR 1.2 (H) 03/04/2013     Assessment & Plan:    See Problem List for Assessment and Plan of chronic medical problems.

## 2021-08-02 ENCOUNTER — Ambulatory Visit (INDEPENDENT_AMBULATORY_CARE_PROVIDER_SITE_OTHER): Payer: Medicare Other | Admitting: Internal Medicine

## 2021-08-02 VITALS — BP 130/68 | HR 77 | Temp 98.5°F | Ht 70.0 in | Wt 156.4 lb

## 2021-08-02 DIAGNOSIS — R0602 Shortness of breath: Secondary | ICD-10-CM | POA: Diagnosis not present

## 2021-08-02 DIAGNOSIS — G6289 Other specified polyneuropathies: Secondary | ICD-10-CM

## 2021-08-02 DIAGNOSIS — M109 Gout, unspecified: Secondary | ICD-10-CM

## 2021-08-02 DIAGNOSIS — I2510 Atherosclerotic heart disease of native coronary artery without angina pectoris: Secondary | ICD-10-CM

## 2021-08-02 DIAGNOSIS — I5032 Chronic diastolic (congestive) heart failure: Secondary | ICD-10-CM

## 2021-08-02 DIAGNOSIS — G4700 Insomnia, unspecified: Secondary | ICD-10-CM | POA: Diagnosis not present

## 2021-08-02 NOTE — Assessment & Plan Note (Signed)
Chronic Has worsened over the years At 1 point involve the whole leg bilaterally-worse lower legs and feet He does have pain when he is walking and it is affecting his balance Using a cane He is exercising regularly He has tried medication in the past, but had side effects

## 2021-08-02 NOTE — Assessment & Plan Note (Signed)
Chronic He denies any gout symptoms and has not had any issues recently Continue allopurinol 100 mg daily, which has decreased his gout flares significantly

## 2021-08-02 NOTE — Progress Notes (Signed)
Remote pacemaker transmission.   

## 2021-08-02 NOTE — Assessment & Plan Note (Signed)
Chronic Shortness of breath for years that has gotten worse Worse with activity Has seen pulmonary 07/2020-at that time did not feel that lung function was contributing to shortness of breath Sees cardiology-feels shortness of breath is likely multifactorial.  Has follow-up echocardiogram in December Sees nephrology-chronic kidney disease, chronic anemia likely contributing.  Advised him to discuss further at his next appointment Deconditioning-likely contributing.  With COVID he was exercising much less.  He is exercising some now, but has little stamina and gets short of breath easier. Testosterone deficiency-likely contributing Can consider pulmonary evaluation again We will do blood work at his appointment in November

## 2021-08-02 NOTE — Assessment & Plan Note (Signed)
Chronic Intermittently has increased leg swelling, weight gain and will take Lasix 40 mg as needed Last took this last week-did not for leg swelling, but did not improve shortness of breath Continue Lasix 40 mg daily as needed

## 2021-08-02 NOTE — Patient Instructions (Addendum)
        Medications changes include :   none      Return for follow up as scheduled.  

## 2021-08-07 ENCOUNTER — Other Ambulatory Visit: Payer: Self-pay

## 2021-08-07 ENCOUNTER — Telehealth: Payer: Self-pay | Admitting: Internal Medicine

## 2021-08-07 ENCOUNTER — Other Ambulatory Visit: Payer: Self-pay | Admitting: Internal Medicine

## 2021-08-07 MED ORDER — ALLOPURINOL 100 MG PO TABS: ORAL_TABLET | ORAL | 0 refills | Status: DC

## 2021-08-07 NOTE — Telephone Encounter (Signed)
30 day supply sent in to [facility].  Will send in 90 day supply tomorrow.

## 2021-08-07 NOTE — Telephone Encounter (Signed)
Caller & Relationship to patient: [name]  Call back number: [phone]  Date of last office visit: 08/02/21  Date of next office visit: 11/10/21  Medication(s) to be refilled: allopurinol (ZYLOPRIM) 100 MG tablet  Preferred Pharmacy:  [facility] [place] Phone:  [phone]  Fax:  [phone]      Pt stated he will be out of medication tomorrow and is requesting a short supply be sent to [facility] to hold him over until [facility] sends him his 90 day supply.   Pharmacy:  [facility] [facility] [place] Phone:  [phone]  Fax:  [phone]

## 2021-08-08 ENCOUNTER — Other Ambulatory Visit: Payer: Self-pay

## 2021-08-08 MED ORDER — ALLOPURINOL 100 MG PO TABS: ORAL_TABLET | ORAL | 0 refills | Status: DC

## 2021-08-09 ENCOUNTER — Encounter (HOSPITAL_COMMUNITY): Payer: Medicare Other

## 2021-08-10 ENCOUNTER — Other Ambulatory Visit: Payer: Self-pay | Admitting: Internal Medicine

## 2021-08-23 ENCOUNTER — Encounter: Payer: Self-pay | Admitting: Emergency Medicine

## 2021-08-23 ENCOUNTER — Ambulatory Visit (INDEPENDENT_AMBULATORY_CARE_PROVIDER_SITE_OTHER): Payer: Medicare Other | Admitting: Emergency Medicine

## 2021-08-23 VITALS — BP 124/66 | HR 69 | Temp 97.8°F | Ht 70.0 in | Wt 153.5 lb

## 2021-08-23 DIAGNOSIS — I2510 Atherosclerotic heart disease of native coronary artery without angina pectoris: Secondary | ICD-10-CM

## 2021-08-23 DIAGNOSIS — R42 Dizziness and giddiness: Secondary | ICD-10-CM | POA: Diagnosis not present

## 2021-08-23 LAB — CBC WITH DIFFERENTIAL/PLATELET
Basophils Absolute: 0.1 10*3/uL (ref 0.0–0.1)
Basophils Relative: 1.2 % (ref 0.0–3.0)
Eosinophils Absolute: 0.3 10*3/uL (ref 0.0–0.7)
Eosinophils Relative: 5.4 % — ABNORMAL HIGH (ref 0.0–5.0)
HCT: 35.9 % — ABNORMAL LOW (ref 39.0–52.0)
Hemoglobin: 11.5 g/dL — ABNORMAL LOW (ref 13.0–17.0)
Lymphocytes Relative: 16.6 % (ref 12.0–46.0)
Lymphs Abs: 1 10*3/uL (ref 0.7–4.0)
MCHC: 31.9 g/dL (ref 30.0–36.0)
MCV: 79.7 fl (ref 78.0–100.0)
Monocytes Absolute: 0.8 10*3/uL (ref 0.1–1.0)
Monocytes Relative: 12.1 % — ABNORMAL HIGH (ref 3.0–12.0)
Neutro Abs: 4 10*3/uL (ref 1.4–7.7)
Neutrophils Relative %: 64.7 % (ref 43.0–77.0)
Platelets: 214 10*3/uL (ref 150.0–400.0)
RBC: 4.5 Mil/uL (ref 4.22–5.81)
RDW: 16.4 % — ABNORMAL HIGH (ref 11.5–15.5)
WBC: 6.2 10*3/uL (ref 4.0–10.5)

## 2021-08-23 LAB — COMPREHENSIVE METABOLIC PANEL
ALT: 7 U/L (ref 0–53)
AST: 15 U/L (ref 0–37)
Albumin: 4.3 g/dL (ref 3.5–5.2)
Alkaline Phosphatase: 57 U/L (ref 39–117)
BUN: 47 mg/dL — ABNORMAL HIGH (ref 6–23)
CO2: 28 mEq/L (ref 19–32)
Calcium: 10.2 mg/dL (ref 8.4–10.5)
Chloride: 101 mEq/L (ref 96–112)
Creatinine, Ser: 2.29 mg/dL — ABNORMAL HIGH (ref 0.40–1.50)
GFR: 24.07 mL/min — ABNORMAL LOW (ref >60.00)
Glucose, Bld: 95 mg/dL (ref 70–99)
Potassium: 4.2 mEq/L (ref 3.5–5.1)
Sodium: 136 mEq/L (ref 135–145)
Total Bilirubin: 0.6 mg/dL (ref 0.2–1.2)
Total Protein: 7.5 g/dL (ref 6.0–8.3)

## 2021-08-23 MED ORDER — MECLIZINE HCL 25 MG PO TABS: 25.0000 mg | ORAL_TABLET | Freq: Every evening | ORAL | 0 refills | Status: DC | PRN

## 2021-08-23 NOTE — Patient Instructions (Signed)
Vertigo Vertigo is the feeling that you or the things around you are moving when they are not. This feeling can come and go at any time. Vertigo often goes away on its own. This condition can be dangerous if it happens when you are doing activities like driving or working with machines. Your doctor will do tests to find the cause of your vertigo. These tests will also help your doctor decide on the best treatment for you. Follow these instructions at home: Eating and drinking     Drink enough fluid to keep your pee (urine) pale yellow. Do not drink alcohol. Activity Return to your normal activities when your doctor says that it is safe. In the morning, first sit up on the side of the bed. When you feel okay, stand slowly while you hold onto something until you know that your balance is fine. Move slowly. Avoid sudden body or head movements or certain positions, as told by your doctor. Use a cane if you have trouble standing or walking. Sit down right away if you feel dizzy. Avoid doing any tasks or activities that can cause danger to you or others if you get dizzy. Avoid bending down if you feel dizzy. Place items in your home so that they are easy for you to reach without bending or leaning over. Do not drive or use machinery if you feel dizzy. General instructions Take over-the-counter and prescription medicines only as told by your doctor. Keep all follow-up visits. Contact a doctor if: Your medicine does not help your vertigo. Your problems get worse or you have new symptoms. You have a fever. You feel like you may vomit (nauseous), or this feeling gets worse. You start to vomit. Your family or friends see changes in how you act. You lose feeling (have numbness) in part of your body. You feel prickling and tingling in a part of your body. Get help right away if: You are always dizzy. You faint. You get very bad headaches. You get a stiff neck. Bright light starts to bother  you. You have trouble moving or talking. You feel weak in your hands, arms, or legs. You have changes in your hearing or in how you see (vision). These symptoms may be an emergency. Get help right away. Call your local emergency services (911 in the [place] Do not wait to see if the symptoms will go away. Do not drive yourself to the hospital. Summary Vertigo is the feeling that you or the things around you are moving when they are not. Your doctor will do tests to find the cause of your vertigo. You may be told to avoid some tasks, positions, or movements. Contact a doctor if your medicine is not helping, or if you have a fever, new symptoms, or a change in how you act. Get help right away if you get very bad headaches, or if you have changes in how you speak, hear, or see. This information is not intended to replace advice given to you by your health care provider. Make sure you discuss any questions you have with your health care provider. Document Revised: 11/25/2019 Document Reviewed: 11/25/2019 Elsevier Patient Education  2023 [employer]  

## 2021-08-23 NOTE — Assessment & Plan Note (Addendum)
Differential diagnosis discussed with patient and wife. Patient clinically stable.  No red flag signs or symptoms. Afebrile and no signs of an infectious process going on. No findings of a CVA. Denies chest pain.  Has chronic dyspnea on exertion. Medication list reviewed with patient.  No new medications.  No dietary changes. No signs of dehydration. No signs of GI bleeding. Blood work done today. May benefit from bedtime meclizine 25 mg. ED precautions given. Advised to contact the office if no better or worse during the next several days and should follow-up with his PCP sometime in the next 1 to 2 weeks.

## 2021-08-23 NOTE — Progress Notes (Signed)
[name] 88 y.o.   Chief Complaint  Patient presents with   Dizziness    X 3 weeks, worse in the morning and at night    HISTORY OF PRESENT ILLNESS: Acute problem visit today.  Patient of Dr. Billey Gosling. This is a 88 y.o. male complaining of episodes of morning and bedtime dizziness for the last 3 weeks. States when he lies down head starts spinning.  Has been sleeping in a reclining chair.  Not able to sleep well. Had similar episode about 20 years ago. Denies headaches or syncopal episode. Walks with a cane.  Balance not great but unchanged. Denies difficulty breathing or chest pain. Denies speech problem or extremity weakness. Denies flulike symptoms, fever or chills. Able to eat okay.  Denies nausea or vomiting.  Denies abdominal pain or diarrhea. No new medications.  No new diets or over-the-counter supplements. Denies any other associated symptoms. No other complaints or medical concerns today.  HPI   Prior to Admission medications   Medication Sig Start Date End Date Taking? Authorizing Provider  acetaminophen (TYLENOL) 500 MG tablet Take 500 mg by mouth every 6 (six) hours as needed.    [provider]  allopurinol (ZYLOPRIM) 100 MG tablet TAKE 1 TABLET(100 MG) BY MOUTH DAILY 08/10/21   [name], MD  amLODipine (NORVASC) 5 MG tablet TAKE 1 TABLET DAILY 12/26/20   [name], MD  Aromatic Inhalants (VICKS VAPOR INHALER IN) Place 1 puff into both nostrils as needed (for congestion).    [provider]  Ascorbic Acid (VITAMIN C) 1000 MG tablet     [provider]  aspirin EC 81 MG tablet Take 81 mg by mouth daily.    [provider]  Calcium Citrate-Vitamin D (CITRACAL + D PO) Take 2 tablets by mouth in the morning and at bedtime.    [provider]  Carboxymeth-Glycerin-Polysorb (REFRESH OPTIVE MEGA-3 OP) Place 1 drop into both eyes 2 (two) times daily.    [provider]  Cholecalciferol 25 MCG  (1000 UT) capsule Take 1,000 Units by mouth daily.    [provider]  clobetasol cream (TEMOVATE) 0.05 % Apply topically 2 (two) times daily. 08/04/20   [provider]  cyanocobalamin (,VITAMIN B-12,) 1000 MCG/ML injection Inject 1,000 mcg into the muscle once. Monthly injection    [provider]  Cyanocobalamin (VITAMIN B12) 1000 MCG TBCR     [provider]  epoetin alfa-epbx (RETACRIT) 2000 UNIT/ML injection 2,000 Units every 14 (fourteen) days. Patient states he only gets it when he needs it but not every 2 weeks    [provider]  folic acid (FOLVITE) 1 MG tablet Take 1 tablet (1 mg total) by mouth daily. Annual appt due in May must see provider for future refills 02/02/19   [name], MD  furosemide (LASIX) 40 MG tablet TAKE 1 TABLET BY MOUTH EVERY DAY Patient taking differently: as needed. 04/05/21   [name], MD  halobetasol (ULTRAVATE) 0.05 % cream Apply topically as needed. 12/19/20   [provider]  loratadine (CLARITIN) 10 MG tablet Take 10 mg by mouth daily as needed (for seasonal allergies).    [provider]  Multiple Vitamins-Minerals (PRESERVISION AREDS 2 PO) Take by mouth.    [provider]  nitroGLYCERIN (NITROSTAT) 0.4 MG SL tablet DISSOLVE 1 TABLET UNDER THE TONGUE EVERY 5 MINUTES AS NEEDED FOR CHEST PAIN, MAXIMUM 3 TABLETS 04/30/19   [name], MD  Saline (  AYR NASAL MIST ALLERGY/SINUS NA) Place into the nose.    [provider]  Vitamin D, Ergocalciferol, (DRISDOL) 1.25 MG (50000 UNIT) CAPS capsule Take by mouth. 07/13/21   [provider]    Allergies  Allergen Reactions   Aspirin Nausea And Vomiting and Other (See Comments)    Upset stomach- tolerates coated aspirin    Lisinopril Anaphylaxis and Shortness Of Breath    After 3 tablets, he experienced trouble swallowing, throat tightness and hoarseness.    Amoxicillin Nausea Only   Atarax [Hydroxyzine Hcl] Nausea  And Vomiting   Cephalexin Diarrhea   Ciprofloxacin Nausea Only   Clindamycin Other (See Comments)    Reaction not recalled   Clobetasol Other (See Comments)    Not effective   Codeine Nausea Only and Other (See Comments)    Stomach upset   Fish Allergy Nausea And Vomiting   Fluarix [Influenza Virus Vaccine] Itching   Haemophilus Influenzae Itching   Hydrocodone Nausea Only   Hydrocodone-Acetaminophen Nausea And Vomiting   Hydroxyzine Nausea And Vomiting   Latex Itching and Other (See Comments)    (After flu shot)   Macrobid [Nitrofurantoin Macrocrystal] Nausea Only   Niacin Other (See Comments)    Unknown reaction   Niacin-Lovastatin Er Other (See Comments)    Unsure of reaction. Taking simvastatin at home without problems   Niacin-Lovastatin Er Other (See Comments)    Reaction not recalled   Nitrofurantoin Other (See Comments)    Upset stomach    Omeprazole Other (See Comments)    Reaction not recalled- stopped by MD   Other Nausea And Vomiting   Tramadol    Vibramycin [Doxycycline] Other (See Comments)    Reaction not recalled   Adhesive [Tape] Rash   Bactrim [Sulfamethoxazole-Trimethoprim] Rash   Colchicine Rash   Gabapentin     Painful pimples on tongue   Nortriptyline Rash    Patient Active Problem List   Diagnosis Date Noted   Inflammation of operative incision 01/24/2021   Myocardial infarction 01/05/2021   Polyarthralgia    Chronic gouty arthritis    Degeneration of lumbar intervertebral disc    Epistaxis 12/08/2020   Pain in right foot 12/06/2020   Bilateral plantar fasciitis 10/25/2020   Weakness of both lower extremities 04/09/2020   Numbness of left foot 10/21/2019   S/P inguinal hernia repair 08/13/2019   Left inguinal hernia 07/01/2019   Gout 05/12/2018   Chronic diastolic CHF (congestive heart failure) 02/05/2018   UGIB (upper gastrointestinal bleed) 02/01/2018   Lumbar post-laminectomy syndrome 12/12/2017   Hyperpigmentation 11/04/2017    Spinal stenosis of lumbar region 10/09/2017   Frequent urination 09/03/2017   Gouty arthritis of right great toe 09/03/2017   Postnasal drip 08/06/2017   Moderate aortic regurgitation 95/62/1308   Diastolic dysfunction 65/78/4696   Edema 06/29/2017   SOB (shortness of breath) 06/29/2017   Sinus symptom 06/05/2017   Fatigue 05/18/2017   Trochanteric bursitis of hip, bilateral 01/10/2017   Internal hemorrhoids 08/15/2016   Dysphagia 02/06/2016   Cephalalgia 07/14/2015   Constipation 03/01/2015   CHB (complete heart block) 05/03/2012   Postoperative atrial fibrillation 05/03/2012   Pacemaker 04/10/2012   AV block, 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 03/26/2012   Coronary atherosclerosis 11/27/2011   Presence of aortocoronary bypass graft 10/24/2011   Pulmonary nodule    CAD (coronary artery disease)    CKD (chronic kidney disease) stage 3, GFR 30-59 ml/min    Venous insufficiency of leg 06/05/2010   Insomnia 11/09/2009  Pain in joint, shoulder region 10/14/2009   Vitamin B12 deficiency 08/23/2009   Peripheral neuropathy 03/21/2009   Degenerative disc disease, cervical, with radiculopathy 09/21/2008   Dyslipidemia 01/17/2007   Essential hypertension 07/20/2006   Prostate cancer 07/20/2006    Past Medical History:  Diagnosis Date   Acute superficial venous thrombosis of lower extremity    a. RLE after CABG, neg dopp for DVT.   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 S/P stenting to mid RCA, prox PDA 06/1999. b. NSTEMI/CABG x 3 in 10/2011 with LIMA to LAD, SVG to PDA, and SVG to OM1.    Cephalalgia 07/14/2015   CHB (complete heart block) 05/03/2012   Overview:  Status post complete heart block heart rate 28 bpm, alternating with 2 to one AV block and drug for bradycardia.  Status post pacemaker implantation.status post Medtronic pacemaker implantation the  03/28/2012   Chronic diastolic CHF (congestive heart failure) 02/05/2018   Chronic gouty arthritis    Chronic UTI    a. Followed by Dr. Risa Grill - colonized/asymptomatic - not on abx   CKD (chronic kidney disease)    stage 3, GFR 30-59 ml/min; stable with a creatinine around 1.9-2.0 followed by nephrology.   Constipation 03/01/2015   Symptoms and exam consistent with constipation. Abdominal exam is benign with no evidence of pain or obstruction. Discussed importance of increasing fiber and water intake coupled with physical activity to assist with bowel movements. Continue over-the-counter medication management as needed. Follow-up if symptoms worsen or fail to improve.   Coronary atherosclerosis 11/27/2011   Overview:  Multivessel coronary artery disease recently diagnosed by catheterization 2013 History of stent placement with a heparin-coated stent 2001  Last Assessment & Plan:  Status post coronary bypass grafting.  No recurrent chest pain. Overview:  Overview:  S/P stenting to mid RCA, prox PDA 06/1999;  06/2007 Myoview: negative except for apical thinning, EF 68%, last Myoview August 10, 2008 with n   Degeneration of lumbar intervertebral disc    Degenerative disc disease, cervical, with radiculopathy 56/21/3086   Diastolic dysfunction 57/84/6962   Grade 1 DD on Echo 06/2017   Dyslipidemia    Dysphagia 02/06/2016   3/18 - DG Esophagus:  1. Mild esophageal dysmotility, likely presbyesophagus. 2. No other explanation for patient's symptoms. 3. Small hiatal hernia.  On swallow eval - evidence of cervical spine disease and that was likely contributing   Echocardiogram    Echocardiogram 08/2018: EF 50-55, inf-lat AK, mild LVH, Gr 1 DD, RVSP 47, mild LAE, mild to mod MR, mild AI, mild AS (mean 11)   Epistaxis 12/08/2020   Essential hypertension 07/20/2006   Fatigue 05/18/2017   GERD (gastroesophageal reflux disease)    Gout 05/12/2018   Gouty arthritis of right great toe 09/03/2017   Left toe  Injected January 31, 2018    Hyperpigmentation 11/04/2017   Internal hemorrhoids 08/15/2016   Left inguinal hernia 07/01/2019   Lumbar post-laminectomy syndrome 12/12/2017   Moderate aortic regurgitation 07/04/2017   Echo 06/2017:  EF 55-60%, mild LVH, grade 1 DD, mild AS, mod AR, mild MR, mild-mod TR   Myocardial infarction    Neck injury    a. C3-C4 and C4-C5 foraminal narrowing, severe   Numbness of left foot 10/21/2019   Pacemaker 04/10/2012   Medtronic pacemaker  Last Assessment & Plan:  Status post pacemaker  implantation for symptomatic bradycardia.  Pacemaker site is slightly tender and erythematous.  A prescription of Keflex was dispensed.  I also asked the patient to closely followup with the EP service regarding his pacemaker placement. 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  This was switched to by mouth amiodarone at the time of the thoracic surgery appointment was discontinued.   Presence of aortocoronary bypass graft 10/24/2011   Overview:  Performed at [facility] 2013 Last Assessment & Plan:  No complication post coronary bypass grafting.   Prostate cancer ([place])    a. 2001 s/p TURP.   Pulmonary nodule    a. felt to be noncancerous.  Status post followup CT scan 4 mm and stable.   Renal artery stenosis    a. 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  03/28/12   Trochanteric bursitis of hip, bilateral 01/10/2017   UGIB (upper gastrointestinal bleed) 02/01/2018   EGD  02/06/18 - mod, non-erosive gastritis   Venous insufficiency of leg 06/05/2010   Vitamin B12 deficiency 08/23/2009   Jan '14  July '14 B12 level  >1500    472   Weakness of both lower extremities 04/09/2020    Past Surgical History:  Procedure Laterality Date   ANTERIOR CHAMBER WASHOUT Left 10/04/2018   Procedure: Anterior Chamber Washout, Vitreous Tap;  Surgeon: Jalene Mullet, MD;  Location: [place];  Service: Ophthalmology;  Laterality: Left;   cardia catherization  07-07-99   CARDIAC SURGERY  10/18/12   open heart surgery   CATARACT EXTRACTION W/ INTRAOCULAR LENS  IMPLANT, BILATERAL  3/205, 06/2013   [facility]   COLONOSCOPY  04/12/07   CORONARY ARTERY BYPASS GRAFT  10/19/2011   Procedure: CORONARY ARTERY BYPASS GRAFTING (CABG);  Surgeon: Gaye Pollack, MD;  Location: [place];  Service: Open Heart Surgery;  Laterality: N/A;  times three using Left Internal Mammary Artery and Right Greater Saphenouse Vein Graft harvested Endoscopically   edg  07-17-1994   FLEXIBLE SIGMOIDOSCOPY  11-03-1997   GAS INSERTION Left 10/04/2018   Procedure: Insertion Of Gas;  Surgeon: Jalene Mullet, MD;  Location: [place];  Service: Ophthalmology;  Laterality: Left;   GAS/FLUID EXCHANGE Left 10/04/2018   Procedure: Gas/Fluid Exchange;  Surgeon: Jalene Mullet, MD;  Location: [place];  Service: Ophthalmology;  Laterality: Left;   LEFT HEART CATHETERIZATION WITH CORONARY ANGIOGRAM N/A 10/16/2011   Procedure: LEFT HEART CATHETERIZATION WITH CORONARY ANGIOGRAM;  Surgeon: Peter M Martinique, MD;  Location: [facility] CATH LAB;  Service: Cardiovascular;  Laterality: N/A;   LEFT HEART CATHETERIZATION WITH CORONARY/GRAFT ANGIOGRAM N/A 03/11/2013   Procedure: LEFT HEART CATHETERIZATION WITH [name];  Surgeon: Blane Ohara, MD;  Location: [facility] C[facility] LAB;  Service: Cardiovascular;  Laterality: N/A;   lumbar spinal  disk and neck fusion surgery     PACEMAKER INSERTION  03/28/12   PPM implanted for mobitz II AV block   PARS PLANA  VITRECTOMY Left 10/04/2018   Procedure: PARS PLANA VITRECTOMY 25 GAUGE FOR ENDOPHTHALMITIS WITH INJECTION OF INTRAVITREAL ANTIBIOTIC;  Surgeon: Jalene Mullet, MD;  Location: [place];  Service: Ophthalmology;  Laterality: Left;   peripheral vascular catherization  11-24-03   PERMANENT PACEMAKER INSERTION N/A 03/28/2012   Procedure: PERMANENT PACEMAKER INSERTION;  Surgeon: Thompson Grayer, MD;  Location: [facility] CATH LAB;  Service: Cardiovascular;  Laterality: N/A;   PROSTATECTOMY     renal circulation  10-01-03   s/p ptca     stents     X 2   stress cardiolite  05-04-05   spring 09-negative except for apical thinning, EF 68%    Social History   Socioeconomic History   Marital status: Married    Spouse name: [name]   Number of children: 2   Years of education: 13   Highest education level: Some college, no degree  Occupational History   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  married '52 - 3 years, divorced; married '56 - 3 years divorced; married '29-12 yrs - divorced; married '75 -. 1 son '57; 1 daughter - '53; 1 grandchild.  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  Very happily married.  End of life care: yes CPR, no long term mechanical ventilation, no heroic measures.right handed   Right handed   Social Determinants of Health   Financial Resource Strain: Low Risk  (07/28/2021)    Overall Financial Resource Strain (CARDIA)    Difficulty of Paying Living Expenses: Not hard at all  Food Insecurity: No Food Insecurity (07/28/2021)   Hunger Vital Sign    Worried About Running Out of Food in the Last Year: Never true    Ran Out of Food in the Last Year: Never true  Transportation Needs: No Transportation Needs (07/28/2021)   PRAPARE - Hydrologist (Medical): No    Lack of Transportation (Non-Medical): No  Physical Activity: Insufficiently Active (07/28/2021)   Exercise Vital Sign    Days of Exercise per Week: 2 days    Minutes of Exercise per Session: 20 min  Stress: No Stress Concern Present (07/28/2021)   [place]    Feeling of Stress : Not at all  Social Connections: [place] (07/28/2021)   Social Connection and Isolation Panel [NHANES]    Frequency of Communication with Friends and Family: Three times a week    Frequency of Social Gatherings with Friends and Family: Three times a week    Attends Religious Services: More than 4 times per year    Active Member of Clubs or Organizations: Yes    Attends Archivist Meetings: 1 to 4 times per year    Marital Status: Married  Human resources officer Violence: Not At Risk (07/28/2021)   Humiliation, Afraid, Rape, and Kick questionnaire    Fear of Current or Ex-Partner: No  Emotionally Abused: No    Physically Abused: No    Sexually Abused: No    Family History  Problem Relation Age of Onset   Coronary artery disease Father        died @ 73   Other Mother        cerebral hemorrhage - died @ 47   Arthritis Mother    Cancer Brother        Bladder   Prostate cancer Brother    Nephritis Brother        died @ age 63.   Other Brother        cerebral hemorrhage - died @ 32   Heart disease Brother    Breast cancer Other        niece x 2   Diabetes Neg Hx    Colon cancer Neg Hx    Adrenal disorder Neg Hx       Review of Systems  Constitutional: Negative.  Negative for chills and fever.  HENT: Negative.  Negative for congestion and sore throat.   Eyes:  Negative for blurred vision and double vision.  Respiratory: Negative.  Negative for cough and shortness of breath.   Cardiovascular: Negative.  Negative for chest pain and palpitations.  Gastrointestinal:  Negative for abdominal pain, diarrhea, nausea and vomiting.  Genitourinary: Negative.   Skin: Negative.  Negative for rash.  Neurological:  Positive for dizziness. Negative for sensory change, speech change, focal weakness, seizures, loss of consciousness and headaches.  All other systems reviewed and are negative.  Today's Vitals   08/23/21 1525  BP: 124/66  Pulse: 69  Temp: 97.8 F (36.6 C)  TempSrc: Oral  SpO2: 98%  Weight: 153 lb 8 oz (69.6 kg)  Height: '5\' 10"'$  (1.778 m)   Body mass index is 22.02 kg/m.   Physical Exam Vitals reviewed.  Constitutional:      Appearance: Normal appearance.  HENT:     Head: Normocephalic.     Right Ear: Tympanic membrane, ear canal and external ear normal.     Left Ear: Tympanic membrane, ear canal and external ear normal.     Mouth/Throat:     Mouth: Mucous membranes are moist.     Pharynx: Oropharynx is clear.  Eyes:     Extraocular Movements: Extraocular movements intact.     Conjunctiva/sclera: Conjunctivae normal.     Pupils: Pupils are equal, round, and reactive to light.  Cardiovascular:     Rate and Rhythm: Normal rate and regular rhythm.     Pulses: Normal pulses.     Heart sounds: Murmur (Systolic 3/6) heard.  Pulmonary:     Effort: Pulmonary effort is normal.     Breath sounds: Normal breath sounds.  Abdominal:     Palpations: Abdomen is soft.     Tenderness: There is no abdominal tenderness.  Musculoskeletal:     Cervical back: No tenderness.  Lymphadenopathy:     Cervical: No cervical adenopathy.  Skin:    General: Skin is warm and dry.     Capillary Refill:  Capillary refill takes less than 2 seconds.  Neurological:     General: No focal deficit present.     Mental Status: He is alert and oriented to person, place, and time.  Psychiatric:        Mood and Affect: Mood normal.        Behavior: Behavior normal.      ASSESSMENT & PLAN: A total of 45 minutes was spent with  the patient and counseling/coordination of care regarding preparing for this visit, review of most recent office visit notes and available medical records, review of multiple chronic medical problems and their management, review of all medications, review of most recent blood work results, differential diagnosis of vertigo, prognosis, documentation and need for follow-up.  Problem List Items Addressed This Visit       Other   Vertigo - Primary    Differential diagnosis discussed with patient and wife. Patient clinically stable.  No red flag signs or symptoms. Afebrile and no signs of an infectious process going on. No findings of a CVA. Denies chest pain.  Has chronic dyspnea on exertion. Medication list reviewed with patient.  No new medications.  No dietary changes. No signs of dehydration. No signs of GI bleeding. Blood work done today. May benefit from bedtime meclizine 25 mg. ED precautions given. Advised to contact the office if no better or worse during the next several days and should follow-up with his PCP sometime in the next 1 to 2 weeks.      Relevant Medications   meclizine (ANTIVERT) 25 MG tablet   Other Relevant Orders   CBC with Differential/Platelet (Completed)   Comprehensive metabolic panel (Completed)   Patient Instructions  Vertigo Vertigo is the feeling that you or the things around you are moving when they are not. This feeling can come and go at any time. Vertigo often goes away on its own. This condition can be dangerous if it happens when you are doing activities like driving or working with machines. Your doctor will do tests to find the  cause of your vertigo. These tests will also help your doctor decide on the best treatment for you. Follow these instructions at home: Eating and drinking     Drink enough fluid to keep your pee (urine) pale yellow. Do not drink alcohol. Activity Return to your normal activities when your doctor says that it is safe. In the morning, first sit up on the side of the bed. When you feel okay, stand slowly while you hold onto something until you know that your balance is fine. Move slowly. Avoid sudden body or head movements or certain positions, as told by your doctor. Use a cane if you have trouble standing or walking. Sit down right away if you feel dizzy. Avoid doing any tasks or activities that can cause danger to you or others if you get dizzy. Avoid bending down if you feel dizzy. Place items in your home so that they are easy for you to reach without bending or leaning over. Do not drive or use machinery if you feel dizzy. General instructions Take over-the-counter and prescription medicines only as told by your doctor. Keep all follow-up visits. Contact a doctor if: Your medicine does not help your vertigo. Your problems get worse or you have new symptoms. You have a fever. You feel like you may vomit (nauseous), or this feeling gets worse. You start to vomit. Your family or friends see changes in how you act. You lose feeling (have numbness) in part of your body. You feel prickling and tingling in a part of your body. Get help right away if: You are always dizzy. You faint. You get very bad headaches. You get a stiff neck. Bright light starts to bother you. You have trouble moving or talking. You feel weak in your hands, arms, or legs. You have changes in your hearing or in how you see (vision). These symptoms may  be an emergency. Get help right away. Call your local emergency services (911 in the [place] Do not wait to see if the symptoms will go away. Do not drive  yourself to the hospital. Summary Vertigo is the feeling that you or the things around you are moving when they are not. Your doctor will do tests to find the cause of your vertigo. You may be told to avoid some tasks, positions, or movements. Contact a doctor if your medicine is not helping, or if you have a fever, new symptoms, or a change in how you act. Get help right away if you get very bad headaches, or if you have changes in how you speak, hear, or see. This information is not intended to replace advice given to you by your health care provider. Make sure you discuss any questions you have with your health care provider. Document Revised: 11/25/2019 Document Reviewed: 11/25/2019 Elsevier Patient Education  2023 [place], MD [facility]

## 2021-08-25 ENCOUNTER — Encounter (HOSPITAL_COMMUNITY): Payer: Medicare Other

## 2021-08-28 ENCOUNTER — Ambulatory Visit (INDEPENDENT_AMBULATORY_CARE_PROVIDER_SITE_OTHER): Payer: Medicare Other

## 2021-08-28 DIAGNOSIS — E538 Deficiency of other specified B group vitamins: Secondary | ICD-10-CM | POA: Diagnosis not present

## 2021-08-28 MED ORDER — CYANOCOBALAMIN 1000 MCG/ML IJ SOLN
1000.0000 ug | Freq: Once | INTRAMUSCULAR | Status: AC
  Administered 2021-08-28: 1000 ug via INTRAMUSCULAR

## 2021-08-28 NOTE — Progress Notes (Signed)
After obtaining consent, and per orders of Dr. Burns, injection of B12 given on the left deltoid by Shadrach Bartunek P Icesis Renn. Patient instructed to report any adverse reaction to me immediately.  

## 2021-09-20 ENCOUNTER — Ambulatory Visit (INDEPENDENT_AMBULATORY_CARE_PROVIDER_SITE_OTHER): Payer: Medicare Other | Admitting: Pulmonary Disease

## 2021-09-20 ENCOUNTER — Encounter: Payer: Self-pay | Admitting: Pulmonary Disease

## 2021-09-20 VITALS — BP 120/60 | HR 75 | Ht 70.0 in | Wt 153.2 lb

## 2021-09-20 DIAGNOSIS — I5032 Chronic diastolic (congestive) heart failure: Secondary | ICD-10-CM

## 2021-09-20 DIAGNOSIS — R0602 Shortness of breath: Secondary | ICD-10-CM | POA: Diagnosis not present

## 2021-09-20 DIAGNOSIS — I272 Pulmonary hypertension, unspecified: Secondary | ICD-10-CM

## 2021-09-20 DIAGNOSIS — Z8616 Personal history of COVID-19: Secondary | ICD-10-CM | POA: Diagnosis not present

## 2021-09-20 NOTE — Progress Notes (Signed)
Synopsis: Referred in June 2022 for sob abnormal imaging by [name], MD  Subjective:   PATIENT ID: [name] GENDER: male DOB: 1926-02-08, MRN: [id number]  Chief Complaint  Patient presents with   Follow-up    SOB    This is a 88 year old gentleman, past medical history of atrial fibrillation, coronary artery disease status post stenting, prior CABG in 2013, CKD, prostate cancer status post TURP, retired from 74 years in the [employer] in [place], retired from [employer] in [place].  Complaints today of shortness of breath.  This seems to be episodic in nature.  Additionally he has trouble swallowing at times has had EGD in the past.  Occasionally he gets choked up at times.  Recently had COVID and has been slowly recovering from this.  Still feels short of breath with exertion.  He had a nuclear medicinePET scan completed 05/18/2020 with Dr. Alen Blew.  This revealed no significant recurrence of prostate cancer no evidence of metastasis however did show mild lower lobe consolidation concern for aspiration pneumonitis within the right lower lobe.  He has not had a recent swallow study.  Additionally prior echocardiogram completed last year on 07/07/2019 reveals moderate pulmonary hypertension, grade 2 diastolic dysfunction.  Patient follows with Dr. Burt Knack.  OV 07/15/2020: PFTs completed today. All within normal limits. He has been trying to get out and exercise more. He feels like this has been slow. Feels more run down than before.    OV 09/20/2021: HEre today for follow-up.  Here today for follow-up.  He still feels short of breath with exertion.  He had work-up last year after having COVID.  His chest x-ray is clear.  He has had a repeat echocardiogram with moderately elevated pulmonary pressures.  He had pulmonary function tests which were unrevealing.  This predominantly is brought on when he is exerting himself.  He does have follow-up with cardiology soon.    Past Medical  History:  Diagnosis Date   Acute superficial venous thrombosis of lower extremity    a. RLE after CABG, neg dopp for DVT.   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 S/P stenting to mid RCA, prox PDA 06/1999. b. NSTEMI/CABG x 3 in 10/2011 with LIMA to LAD, SVG to PDA, and SVG to OM1.    Cephalalgia 07/14/2015   CHB (complete heart block) 05/03/2012   Overview:  Status post complete heart block heart rate 28 bpm, alternating with 2 to one AV block and drug for bradycardia.  Status post pacemaker implantation.status post Medtronic pacemaker implantation the 03/28/2012   Chronic diastolic CHF (congestive heart failure) 02/05/2018   Chronic gouty arthritis    Chronic UTI    a. Followed by Dr. Risa Grill - colonized/asymptomatic - not on abx   CKD (chronic kidney disease)    stage 3, GFR 30-59 ml/min; stable with a creatinine around 1.9-2.0 followed by nephrology.   Constipation 03/01/2015   Symptoms and exam consistent with constipation. Abdominal exam is benign with no evidence of pain or obstruction. Discussed importance of increasing fiber and water intake coupled with physical activity to assist with bowel movements. Continue over-the-counter medication management as needed. Follow-up if symptoms worsen or fail to improve.   Coronary atherosclerosis 11/27/2011   Overview:  Multivessel coronary artery disease recently diagnosed by catheterization 2013 History of stent placement with  a heparin-coated stent 2001  Last Assessment & Plan:  Status post coronary bypass grafting.  No recurrent chest pain. Overview:  Overview:  S/P stenting to mid RCA, prox PDA 06/1999;  06/2007 Myoview: negative except for apical thinning, EF 68%, last Myoview August 10, 2008 with n   Degeneration of lumbar intervertebral disc    Degenerative disc disease, cervical, with radiculopathy 32/99/2426   Diastolic  dysfunction 83/41/9622   Grade 1 DD on Echo 06/2017   Dyslipidemia    Dysphagia 02/06/2016   3/18 - DG Esophagus:  1. Mild esophageal dysmotility, likely presbyesophagus. 2. No other explanation for patient's symptoms. 3. Small hiatal hernia.  On swallow eval - evidence of cervical spine disease and that was likely contributing   Echocardiogram    Echocardiogram 08/2018: EF 50-55, inf-lat AK, mild LVH, Gr 1 DD, RVSP 47, mild LAE, mild to mod MR, mild AI, mild AS (mean 11)   Epistaxis 12/08/2020   Essential hypertension 07/20/2006   Fatigue 05/18/2017   GERD (gastroesophageal reflux disease)    Gout 05/12/2018   Gouty arthritis of right great toe 09/03/2017   Left toe Injected January 31, 2018    Hyperpigmentation 11/04/2017   Internal hemorrhoids 08/15/2016   Left inguinal hernia 07/01/2019   Lumbar post-laminectomy syndrome 12/12/2017   Moderate aortic regurgitation 07/04/2017   Echo 06/2017:  EF 55-60%, mild LVH, grade 1 DD, mild AS, mod AR, mild MR, mild-mod TR   Myocardial infarction    Neck injury    a. C3-C4 and C4-C5 foraminal narrowing, severe   Numbness of left foot 10/21/2019   Pacemaker 04/10/2012   Medtronic pacemaker  Last Assessment & Plan:  Status post pacemaker implantation for symptomatic bradycardia.  Pacemaker site is slightly tender and erythematous.  A prescription of Keflex was dispensed.  I also asked the patient to closely followup with the EP service regarding his pacemaker placement. 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  This was switched to by mouth amiodarone at the time of the thoracic surgery appointment was discontinued.   Presence of aortocoronary bypass graft 10/24/2011   Overview:  Performed at [facility] [place] 2013 Last Assessment & Plan:  No complication post coronary bypass grafting.   Prostate cancer ([place])    a. 2001 s/p TURP.   Pulmonary nodule    a. felt to be noncancerous.  Status post followup CT scan 4 mm and stable.   Renal artery stenosis    a. 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   Trochanteric bursitis of hip, bilateral 01/10/2017   UGIB (upper gastrointestinal bleed) 02/01/2018   EGD  02/06/18 - mod, non-erosive gastritis   Venous insufficiency of leg 06/05/2010   Vitamin B12 deficiency 08/23/2009   Jan '14  July '14 B12 level  >1500    472   Weakness of both lower extremities 04/09/2020     Family  History  Problem Relation Age of Onset   Coronary artery disease Father        died @ 47   Other Mother        cerebral hemorrhage - died @ 71   Arthritis Mother    Cancer Brother        Bladder   Prostate cancer Brother    Nephritis Brother        died @ age 30.   Other Brother        cerebral hemorrhage - died @ 76   Heart disease Brother    Breast cancer Other        niece x 2   Diabetes Neg Hx    Colon cancer Neg Hx    Adrenal disorder Neg Hx      Past Surgical History:  Procedure Laterality Date   ANTERIOR CHAMBER WASHOUT Left 10/04/2018   Procedure: Anterior Chamber Washout, Vitreous Tap;  Surgeon: Jalene Mullet, MD;  Location: [place];  Service: Ophthalmology;  Laterality: Left;   cardia catherization  07-07-99   CARDIAC SURGERY  10/18/12   open heart surgery   CATARACT EXTRACTION W/ INTRAOCULAR LENS  IMPLANT, BILATERAL  3/205, 06/2013   [facility]   COLONOSCOPY   04/12/07   CORONARY ARTERY BYPASS GRAFT  10/19/2011   Procedure: CORONARY ARTERY BYPASS GRAFTING (CABG);  Surgeon: Gaye Pollack, MD;  Location: [place];  Service: Open Heart Surgery;  Laterality: N/A;  times three using Left Internal Mammary Artery and Right Greater Saphenouse Vein Graft harvested Endoscopically   edg  07-17-1994   FLEXIBLE SIGMOIDOSCOPY  11-03-1997   GAS INSERTION Left 10/04/2018   Procedure: Insertion Of Gas;  Surgeon: Jalene Mullet, MD;  Location: [place];  Service: Ophthalmology;  Laterality: Left;   GAS/FLUID EXCHANGE Left 10/04/2018   Procedure: Gas/Fluid Exchange;  Surgeon: Jalene Mullet, MD;  Location: [place];  Service: Ophthalmology;  Laterality: Left;   LEFT HEART CATHETERIZATION WITH CORONARY ANGIOGRAM N/A 10/16/2011   Procedure: LEFT HEART CATHETERIZATION WITH CORONARY ANGIOGRAM;  Surgeon: Peter M Martinique, MD;  Location[facility] CATH LAB;  Service: Cardiovascular;  Laterality: N/A;   LEFT HEART CATHETERIZATION WITH CORONARY/GRAFT ANGIOGRAM N/A 03/11/2013   Procedure: LEFT HEART CATHETERIZATION WITH [name];  Surgeon: Blane Ohara, MD;  Location: [facility] CATH LAB;  Service: Cardiovascular;  Laterality: N/A;   lumbar spinal disk and neck fusion surgery     PACEMAKER INSERTION  03/28/12   PPM implanted for mobitz II AV block   PARS PLANA VITRECTOMY Left 10/04/2018   Procedure: PARS PLANA VITRECTOMY 25 GAUGE FOR ENDOPHTHALMITIS WITH INJECTION OF INTRAVITREAL ANTIBIOTIC;  Surgeon: Jalene Mullet, MD;  Location: [place];  Service: Ophthalmology;  Laterality: Left;   peripheral vascular catherization  11-24-03   PERMANENT PACEMAKER INSERTION N/A 03/28/2012   Procedure: PERMANENT PACEMAKER INSERTION;  Surgeon: Thompson Grayer, MD;  Location: [facility] CATH LAB;  Service: Cardiovascular;  Laterality: N/A;   PROSTATECTOMY     renal circulation  10-01-03   s/p ptca     stents     X 2   stress cardiolite  05-04-05   spring 09-negative except for apical thinning, EF 68%    Social  History   Socioeconomic History   Marital status: Married    Spouse name: [name]   Number of children: 2   Years of education: 13   Highest education level: Some college, no degree  Occupational History   Occupation: Sales executive     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  married '52 - 3 years, divorced; married '56 - 3 years divorced; married '6-12 yrs - divorced; married '75 -. 1 son '57; 1 daughter - '53; 1 grandchild.  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  Very happily married.  End of life care: yes CPR, no long term mechanical ventilation, no heroic measures.right handed   Right handed   Social Determinants of Health   Financial Resource Strain: Low Risk  (07/28/2021)   Overall Financial Resource Strain (CARDIA)    Difficulty of Paying Living Expenses: Not hard at all  Food Insecurity: No Food Insecurity (07/28/2021)   Hunger Vital Sign    Worried About Running Out of Food in the Last Year: Never true    Ran Out of Food in the Last Year: Never true  Transportation Needs: No Transportation Needs (07/28/2021)   PRAPARE - Hydrologist (Medical): No    Lack of Transportation (Non-Medical): No  Physical Activity: Insufficiently Active (07/28/2021)   Exercise Vital Sign    Days of Exercise per Week: 2 days    Minutes of Exercise per Session: 20 min  Stress: No Stress Concern Present (07/28/2021)   [place]     Feeling of Stress : Not at all  Social Connections: [place] (07/28/2021)   Social Connection and Isolation Panel [NHANES]    Frequency of Communication with Friends and Family: Three times a week    Frequency of Social Gatherings with Friends and Family: Three times a week    Attends Religious Services: More than 4 times per year    Active Member of Clubs or Organizations: Yes    Attends Archivist Meetings: 1 to 4 times per year    Marital Status: Married  Human resources officer Violence: Not At Risk (07/28/2021)   Humiliation, Afraid, Rape, and Kick questionnaire    Fear of Current or Ex-Partner: No    Emotionally Abused: No    Physically Abused: No    Sexually Abused: No     Allergies  Allergen Reactions   Aspirin Nausea And Vomiting and Other (See Comments)    Upset stomach- tolerates coated aspirin    Lisinopril Anaphylaxis and Shortness Of Breath    After 3 tablets, he experienced trouble swallowing, throat tightness and hoarseness.    Amoxicillin Nausea Only   Atarax [Hydroxyzine Hcl] Nausea And Vomiting   Cephalexin Diarrhea   Ciprofloxacin Nausea Only   Clindamycin Other (See Comments)    Reaction not recalled   Clobetasol Other (See Comments)    Not effective   Codeine Nausea Only and Other (See Comments)    Stomach upset   Fish Allergy Nausea And Vomiting   Fluarix [Influenza Virus Vaccine] Itching   Haemophilus Influenzae Itching   Hydrocodone Nausea Only   Hydrocodone-Acetaminophen Nausea And Vomiting   Hydroxyzine Nausea And Vomiting   Latex Itching  and Other (See Comments)    (After flu shot)   Macrobid [Nitrofurantoin Macrocrystal] Nausea Only   Niacin Other (See Comments)    Unknown reaction   Niacin-Lovastatin Er Other (See Comments)    Unsure of reaction. Taking simvastatin at home without problems   Niacin-Lovastatin Er Other (See Comments)    Reaction not recalled   Nitrofurantoin Other (See Comments)    Upset stomach     Omeprazole Other (See Comments)    Reaction not recalled- stopped by MD   Other Nausea And Vomiting   Tramadol    Vibramycin [Doxycycline] Other (See Comments)    Reaction not recalled   Adhesive [Tape] Rash   Bactrim [Sulfamethoxazole-Trimethoprim] Rash   Colchicine Rash   Gabapentin     Painful pimples on tongue   Nortriptyline Rash     Outpatient Medications Prior to Visit  Medication Sig Dispense Refill   loratadine (CLARITIN) 10 MG tablet Take 10 mg by mouth daily as needed (for seasonal allergies).     Saline (AYR NASAL MIST ALLERGY/SINUS NA) Place into the nose.     acetaminophen (TYLENOL) 500 MG tablet Take 500 mg by mouth every 6 (six) hours as needed.     allopurinol (ZYLOPRIM) 100 MG tablet TAKE 1 TABLET(100 MG) BY MOUTH DAILY 90 tablet 1   amLODipine (NORVASC) 5 MG tablet TAKE 1 TABLET DAILY 90 tablet 3   Aromatic Inhalants (VICKS VAPOR INHALER IN) Place 1 puff into both nostrils as needed (for congestion).     Ascorbic Acid (VITAMIN C) 1000 MG tablet      aspirin EC 81 MG tablet Take 81 mg by mouth daily.     Calcium Citrate-Vitamin D (CITRACAL + D PO) Take 2 tablets by mouth in the morning and at bedtime.     Carboxymeth-Glycerin-Polysorb (REFRESH OPTIVE MEGA-3 OP) Place 1 drop into both eyes 2 (two) times daily.     Cholecalciferol 25 MCG (1000 UT) capsule Take 1,000 Units by mouth daily.     clobetasol cream (TEMOVATE) 0.05 % Apply topically 2 (two) times daily.     cyanocobalamin (,VITAMIN B-12,) 1000 MCG/ML injection Inject 1,000 mcg into the muscle once. Monthly injection     Cyanocobalamin (VITAMIN B12) 1000 MCG TBCR      epoetin alfa-epbx (RETACRIT) 2000 UNIT/ML injection 2,000 Units every 14 (fourteen) days. Patient states he only gets it when he needs it but not every 2 weeks (Patient not taking: Reported on 2/70/6237)     folic acid (FOLVITE) 1 MG tablet Take 1 tablet (1 mg total) by mouth daily. Annual appt due in May must see provider for future refills 90  tablet 0   furosemide (LASIX) 40 MG tablet TAKE 1 TABLET BY MOUTH EVERY DAY (Patient taking differently: as needed.) 90 tablet 0   halobetasol (ULTRAVATE) 0.05 % cream Apply topically as needed.     meclizine (ANTIVERT) 25 MG tablet Take 1 tablet (25 mg total) by mouth at bedtime as needed for dizziness. 30 tablet 0   Multiple Vitamins-Minerals (PRESERVISION AREDS 2 PO) Take by mouth.     nitroGLYCERIN (NITROSTAT) 0.4 MG SL tablet DISSOLVE 1 TABLET UNDER THE TONGUE EVERY 5 MINUTES AS NEEDED FOR CHEST PAIN, MAXIMUM 3 TABLETS 100 tablet 1   Vitamin D, Ergocalciferol, (DRISDOL) 1.25 MG (50000 UNIT) CAPS capsule Take by mouth.     Facility-Administered Medications Prior to Visit  Medication Dose Route Frequency Provider Last Rate Last Admin   NON FORMULARY 1 application  1 application  Topical PRN Landis Martins, DPM        Review of Systems  Constitutional:  Negative for chills, fever, malaise/fatigue and weight loss.  HENT:  Negative for hearing loss, sore throat and tinnitus.   Eyes:  Negative for blurred vision and double vision.  Respiratory:  Positive for shortness of breath. Negative for cough, hemoptysis, sputum production, wheezing and stridor.   Cardiovascular:  Negative for chest pain, palpitations, orthopnea, leg swelling and PND.  Gastrointestinal:  Negative for abdominal pain, constipation, diarrhea, heartburn, nausea and vomiting.  Genitourinary:  Negative for dysuria, hematuria and urgency.  Musculoskeletal:  Negative for joint pain and myalgias.  Skin:  Negative for itching and rash.  Neurological:  Negative for dizziness, tingling, weakness and headaches.  Endo/Heme/Allergies:  Negative for environmental allergies. Does not bruise/bleed easily.  Psychiatric/Behavioral:  Negative for depression. The patient is not nervous/anxious and does not have insomnia.   All other systems reviewed and are negative.    Objective:  Physical Exam Vitals reviewed.  Constitutional:       General: He is not in acute distress.    Appearance: He is well-developed.  HENT:     Head: Normocephalic and atraumatic.  Eyes:     General: No scleral icterus.    Conjunctiva/sclera: Conjunctivae normal.     Pupils: Pupils are equal, round, and reactive to light.  Neck:     Vascular: No JVD.     Trachea: No tracheal deviation.  Cardiovascular:     Rate and Rhythm: Normal rate and regular rhythm.     Heart sounds: Normal heart sounds. No murmur heard. Pulmonary:     Effort: Pulmonary effort is normal. No tachypnea, accessory muscle usage or respiratory distress.     Breath sounds: No stridor. No wheezing, rhonchi or rales.  Abdominal:     General: There is no distension.     Palpations: Abdomen is soft.     Tenderness: There is no abdominal tenderness.  Musculoskeletal:        General: No tenderness.     Cervical back: Neck supple.     Comments: Trace lower extremity edema  Lymphadenopathy:     Cervical: No cervical adenopathy.  Skin:    General: Skin is warm and dry.     Capillary Refill: Capillary refill takes less than 2 seconds.     Findings: No rash.  Neurological:     Mental Status: He is alert and oriented to person, place, and time.  Psychiatric:        Behavior: Behavior normal.      Vitals:   09/20/21 1341  BP: 120/60  Pulse: 75  SpO2: 98%  Weight: 153 lb 3.2 oz (69.5 kg)  Height: '5\' 10"'$  (1.778 m)   98% on RA BMI Readings from Last 3 Encounters:  09/20/21 21.98 kg/m  08/23/21 22.02 kg/m  08/02/21 22.44 kg/m   Wt Readings from Last 3 Encounters:  09/20/21 153 lb 3.2 oz (69.5 kg)  08/23/21 153 lb 8 oz (69.6 kg)  08/02/21 156 lb 6 oz (70.9 kg)     CBC    Component Value Date/Time   WBC 6.2 08/23/2021 1603   RBC 4.50 08/23/2021 1603   HGB 11.5 (L) 08/23/2021 1603   HGB 11.3 (L) 05/19/2020 1433   HCT 35.9 (L) 08/23/2021 1603   PLT 214.0 08/23/2021 1603   PLT 217 05/19/2020 1433   MCV 79.7 08/23/2021 1603   MCH 24.8 (L) 01/30/2021 1647    MCHC 31.9 08/23/2021  1603   RDW 16.4 (H) 08/23/2021 1603   LYMPHSABS 1.0 08/23/2021 1603   MONOABS 0.8 08/23/2021 1603   EOSABS 0.3 08/23/2021 1603   BASOSABS 0.1 08/23/2021 1603    Chest Imaging: 05/18/2020 nuclear medicine pet image: CT image windows of the lungs reviewed. Lower lobe areas of infiltrate and groundglass likely consistent with basilar atelectasis, dependent atelectasis possible infiltrate. The patient's images have been independently reviewed by me.    Pulmonary Functions Testing Results:    Latest Ref Rng & Units 07/15/2020    2:21 PM  PFT Results  FVC-Pre L 3.50   FVC-Predicted Pre % 99   FVC-Post L 3.56   FVC-Predicted Post % 100   Pre FEV1/FVC % % 73   Post FEV1/FCV % % 76   FEV1-Pre L 2.57   FEV1-Predicted Pre % 106   FEV1-Post L 2.71   DLCO uncorrected ml/min/mmHg 17.58   DLCO corrected ml/min/mmHg 18.80   TLC L 7.25   TLC % Predicted % 102   RV % Predicted % 131     FeNO:   Pathology:   Echocardiogram:   Heart Catheterization:     Assessment & Plan:     ICD-10-CM   1. SOB (shortness of breath)  R06.02     2. Pulmonary hypertension (HCC)  I27.20     3. History of COVID-19  Z86.16     4. Chronic diastolic CHF (congestive heart failure)  I50.32       Discussion:  This is a 88 year old gentleman multifactorial etiology of shortness of breath I am not sure that he has any significant lung limiting factors.  His lung function is relatively normal.  He is recovered from COVID-19.  He does have some mildly elevated pulmonary pressures on the echocardiogram.  He does not look fluid overloaded today. Plan: I think he should continue his daily exercise routine. He is got follow-up with cardiology already scheduled. He is can let us know if anything gets worse or changes. Return to clinic in 1 year or as needed.    Current Outpatient Medications:    loratadine (CLARITIN) 10 MG tablet, Take 10 mg by mouth daily as needed (for seasonal  allergies)., Disp: , Rfl:    Saline (AYR NASAL MIST ALLERGY/SINUS NA), Place into the nose., Disp: , Rfl:    acetaminophen (TYLENOL) 500 MG tablet, Take 500 mg by mouth every 6 (six) hours as needed., Disp: , Rfl:    allopurinol (ZYLOPRIM) 100 MG tablet, TAKE 1 TABLET(100 MG) BY MOUTH DAILY, Disp: 90 tablet, Rfl: 1   amLODipine (NORVASC) 5 MG tablet, TAKE 1 TABLET DAILY, Disp: 90 tablet, Rfl: 3   Aromatic Inhalants (VICKS VAPOR INHALER IN), Place 1 puff into both nostrils as needed (for congestion)., Disp: , Rfl:    Ascorbic Acid (VITAMIN C) 1000 MG tablet, , Disp: , Rfl:    aspirin EC 81 MG tablet, Take 81 mg by mouth daily., Disp: , Rfl:    Calcium Citrate-Vitamin D (CITRACAL + D PO), Take 2 tablets by mouth in the morning and at bedtime., Disp: , Rfl:    Carboxymeth-Glycerin-Polysorb (REFRESH OPTIVE MEGA-3 OP), Place 1 drop into both eyes 2 (two) times daily., Disp: , Rfl:    Cholecalciferol 25 MCG (1000 UT) capsule, Take 1,000 Units by mouth daily., Disp: , Rfl:    clobetasol cream (TEMOVATE) 0.05 %, Apply topically 2 (two) times daily., Disp: , Rfl:    cyanocobalamin (,VITAMIN B-12,) 1000 MCG/ML injection, Inject 1,000 mcg into  the muscle once. Monthly injection, Disp: , Rfl:    Cyanocobalamin (VITAMIN B12) 1000 MCG TBCR, , Disp: , Rfl:    epoetin alfa-epbx (RETACRIT) 2000 UNIT/ML injection, 2,000 Units every 14 (fourteen) days. Patient states he only gets it when he needs it but not every 2 weeks (Patient not taking: Reported on 08/23/2021), Disp: , Rfl:    folic acid (FOLVITE) 1 MG tablet, Take 1 tablet (1 mg total) by mouth daily. Annual appt due in May must see provider for future refills, Disp: 90 tablet, Rfl: 0   furosemide (LASIX) 40 MG tablet, TAKE 1 TABLET BY MOUTH EVERY DAY (Patient taking differently: as needed.), Disp: 90 tablet, Rfl: 0   halobetasol (ULTRAVATE) 0.05 % cream, Apply topically as needed., Disp: , Rfl:    meclizine (ANTIVERT) 25 MG tablet, Take 1 tablet (25 mg total)  by mouth at bedtime as needed for dizziness., Disp: 30 tablet, Rfl: 0   Multiple Vitamins-Minerals (PRESERVISION AREDS 2 PO), Take by mouth., Disp: , Rfl:    nitroGLYCERIN (NITROSTAT) 0.4 MG SL tablet, DISSOLVE 1 TABLET UNDER THE TONGUE EVERY 5 MINUTES AS NEEDED FOR CHEST PAIN, MAXIMUM 3 TABLETS, Disp: 100 tablet, Rfl: 1   Vitamin D, Ergocalciferol, (DRISDOL) 1.25 MG (50000 UNIT) CAPS capsule, Take by mouth., Disp: , Rfl:   Current Facility-Administered Medications:    NON FORMULARY 1 application, 1 application , Topical, PRN, Landis Martins, DPM    Garner Nash, DO [facility] 09/20/2021 2:06 PM

## 2021-09-20 NOTE — Patient Instructions (Signed)
Thank you for visiting Dr. Valeta Harms at [facility]. Today we recommend the following:  Return in about 1 year (around 09/21/2022), or if symptoms worsen or fail to improve, for with APP or Dr. Valeta Harms.    Please do your part to reduce the spread of COVID-19.

## 2021-09-21 ENCOUNTER — Telehealth: Payer: Self-pay | Admitting: Neurology

## 2021-09-21 ENCOUNTER — Ambulatory Visit (INDEPENDENT_AMBULATORY_CARE_PROVIDER_SITE_OTHER): Payer: Medicare Other | Admitting: Neurology

## 2021-09-21 ENCOUNTER — Encounter: Payer: Self-pay | Admitting: Neurology

## 2021-09-21 VITALS — BP 145/74 | HR 67 | Ht 69.0 in | Wt 154.0 lb

## 2021-09-21 DIAGNOSIS — Z9889 Other specified postprocedural states: Secondary | ICD-10-CM | POA: Diagnosis not present

## 2021-09-21 DIAGNOSIS — R269 Unspecified abnormalities of gait and mobility: Secondary | ICD-10-CM | POA: Diagnosis not present

## 2021-09-21 DIAGNOSIS — I2510 Atherosclerotic heart disease of native coronary artery without angina pectoris: Secondary | ICD-10-CM

## 2021-09-21 NOTE — Telephone Encounter (Signed)
tricare/medicare NPR sent to [facility] for pacemaker
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
## 2021-09-25 ENCOUNTER — Ambulatory Visit (HOSPITAL_COMMUNITY)
Admission: RE | Admit: 2021-09-25 | Discharge: 2021-09-25 | Disposition: A | Discharge status: Home / Self Care | Payer: Medicare Other | Source: Ambulatory Visit | Attending: Neurology | Admitting: Neurology

## 2021-09-25 DIAGNOSIS — R269 Unspecified abnormalities of gait and mobility: Secondary | ICD-10-CM | POA: Diagnosis present

## 2021-09-25 DIAGNOSIS — Z9889 Other specified postprocedural states: Secondary | ICD-10-CM | POA: Insufficient documentation

## 2021-09-26 ENCOUNTER — Ambulatory Visit: Payer: Self-pay | Admitting: Licensed Clinical Social Worker

## 2021-09-26 ENCOUNTER — Telehealth: Payer: Self-pay | Admitting: Neurology

## 2021-09-26 NOTE — Telephone Encounter (Signed)
Pt called wanting to inform the provider that the sleep medicine that he would use to help him is named ZZZQuil Sleep aid. He also wants to inform the provider that the doctor he saw at [facility] no longer works there.

## 2021-09-26 NOTE — Telephone Encounter (Signed)
Spoke to pt he has requested that Dr Krista Blue review his recent cmp to confirm it is appropriate for him to take zzquil sleep aid as needed. I advised pt to contact PCP as well until he reestablish with a nephrologist.

## 2021-09-26 NOTE — Patient Outreach (Signed)
  Care Coordination   Initial Visit Note   09/26/2021 Name: [name] MRN: [id number] DOB: [birth year]  [name] is a 88 y.o. year old male who sees [name], Mike Lick, MD for primary care. I spoke with  [name] ' wife by phone today.  What matters to the patients health and wellness today?    Patient reports no concerns or needs from Care Coordination team with health and wellness related to physical or mental heath. .      Goals Addressed             This Visit's Progress    COMPLETED: Care Coordination Activities No follow up Required       Care Coordination Interventions: Reviewed Care Coordination Services:Declined Medicare Annual Wellness Visit: has been completed            SDOH assessments and interventions completed:  No     Care Coordination Interventions Activated:  Yes  Care Coordination Interventions:  Yes, provided   Follow up plan: No further intervention required.   Encounter Outcome:  Pt. Visit Completed   [name], [place] [phone]

## 2021-09-26 NOTE — Patient Instructions (Signed)
Visit Information  Thank you for taking time to visit with me today. Please don't hesitate to contact me if I can be of assistance to you.   Following are the goals we discussed today:   Goals Addressed             This Visit's Progress    COMPLETED: Care Coordination Activities No follow up Required       Care Coordination Interventions: Reviewed Care Coordination Services:Declined Medicare Annual Wellness Visit: has been completed            Care Coordination team works in collaboration with your primary care doctor.  Please call [phone] if you would like to schedule a phone appointment with a Nurse or Social work Care Coordinator to assist with navigating your physical and mental health needs.    Patient verbalizes understanding of instructions and care plan provided today and agrees to view in [place]. Active MyChart status and patient understanding of how to access instructions and care plan via MyChart confirmed with patient.     No further follow up required: by Care Coordination at this time  Casimer Lanius, [place] [phone]

## 2021-09-27 ENCOUNTER — Ambulatory Visit (INDEPENDENT_AMBULATORY_CARE_PROVIDER_SITE_OTHER): Payer: Medicare Other

## 2021-09-27 ENCOUNTER — Ambulatory Visit: Payer: Medicare Other | Attending: Neurology

## 2021-09-27 DIAGNOSIS — E538 Deficiency of other specified B group vitamins: Secondary | ICD-10-CM | POA: Diagnosis not present

## 2021-09-27 DIAGNOSIS — R262 Difficulty in walking, not elsewhere classified: Secondary | ICD-10-CM | POA: Insufficient documentation

## 2021-09-27 DIAGNOSIS — R2689 Other abnormalities of gait and mobility: Secondary | ICD-10-CM | POA: Diagnosis present

## 2021-09-27 DIAGNOSIS — R269 Unspecified abnormalities of gait and mobility: Secondary | ICD-10-CM | POA: Insufficient documentation

## 2021-09-27 DIAGNOSIS — Z9889 Other specified postprocedural states: Secondary | ICD-10-CM | POA: Diagnosis not present

## 2021-09-27 DIAGNOSIS — R2681 Unsteadiness on feet: Secondary | ICD-10-CM | POA: Diagnosis present

## 2021-09-27 MED ORDER — CYANOCOBALAMIN 1000 MCG/ML IJ SOLN
1000.0000 ug | Freq: Once | INTRAMUSCULAR | Status: AC
  Administered 2021-09-27: 1000 ug via INTRAMUSCULAR

## 2021-09-27 NOTE — Telephone Encounter (Signed)
Please call patient, CT cervical spine showed multilevel severe degenerative changes, moving forward of C7 on T1, and T1 on T2, with evidence of canal stenosis,  There was no acute abnormalities,  Zzquil is actually a combination of Tylenol and Benadryl, Benadryl and its liver metabolites, renally excreted, all have long half live, tends to have a lot of side effect to the elderly patient,  Her recent creatinine was 2.29, at his baseline, GFR of 24,    Please suggest him reach out to his nephrologist for guidance  IMPRESSION: 1. No evidence of acute fracture or traumatic malalignment. 2. Similar severe multilevel degenerative change.

## 2021-09-27 NOTE — Progress Notes (Signed)
After obtaining consent, and per orders of Dr. Burns, injection of B12 given in the left deltoid by Carlito Bogert P Mishael Haran. Patient instructed to report any adverse reaction to me immediately.  

## 2021-09-27 NOTE — Therapy (Signed)
OUTPATIENT PHYSICAL THERAPY NEURO EVALUATION   Patient Name: [name] MRN: [id number] DOB:06/08/1926, 88 y.o., male Today's Date: 09/27/2021   PCP: [name], MD REFERRING PROVIDER: Marcial Pacas, MD    PT End of Session - 09/27/21 1532     Visit Number 1    Number of Visits 12    Date for PT Re-Evaluation 11/08/21    Authorization Type Medicare/Tricare    PT Start Time 1530    PT Stop Time 1615    PT Time Calculation (min) 45 min             Past Medical History:  Diagnosis Date   Acute superficial venous thrombosis of lower extremity    a. RLE after CABG, neg dopp for DVT.   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 S/P stenting to mid RCA, prox PDA 06/1999. b. NSTEMI/CABG x 3 in 10/2011 with LIMA to LAD, SVG to PDA, and SVG to OM1.    Cephalalgia 07/14/2015   CHB (complete heart block) 05/03/2012   Overview:  Status post complete heart block heart rate 28 bpm, alternating with 2 to one AV block and drug for bradycardia.  Status post pacemaker implantation.status post Medtronic pacemaker implantation the 03/28/2012   Chronic diastolic CHF (congestive heart failure) 02/05/2018   Chronic gouty arthritis    Chronic UTI    a. Followed by Dr. Risa Grill - colonized/asymptomatic - not on abx   CKD (chronic kidney disease)    stage 3, GFR 30-59 ml/min; stable with a creatinine around 1.9-2.0 followed by nephrology.   Constipation 03/01/2015   Symptoms and exam consistent with constipation. Abdominal exam is benign with no evidence of pain or obstruction. Discussed importance of increasing fiber and water intake coupled with physical activity to assist with bowel movements. Continue over-the-counter medication management as needed. Follow-up if symptoms worsen or fail to improve.   Coronary atherosclerosis 11/27/2011   Overview:  Multivessel coronary  artery disease recently diagnosed by catheterization 2013 History of stent placement with a heparin-coated stent 2001  Last Assessment & Plan:  Status post coronary bypass grafting.  No recurrent chest pain. Overview:  Overview:  S/P stenting to mid RCA, prox PDA 06/1999;  06/2007 Myoview: negative except for apical thinning, EF 68%, last Myoview August 10, 2008 with n   Degeneration of lumbar intervertebral disc    Degenerative disc disease, cervical, with radiculopathy 86/57/8469   Diastolic dysfunction 62/95/2841   Grade 1 DD on Echo 06/2017   Dyslipidemia    Dysphagia 02/06/2016   3/18 - DG Esophagus:  1. Mild esophageal dysmotility, likely presbyesophagus. 2. No other explanation for patient's symptoms. 3. Small hiatal hernia.  On swallow eval - evidence of cervical spine disease and that was likely contributing   Echocardiogram    Echocardiogram 08/2018: EF 50-55, inf-lat AK, mild LVH, Gr 1 DD, RVSP 47, mild LAE, mild to mod MR, mild AI, mild AS (mean 11)   Epistaxis 12/08/2020   Essential hypertension 07/20/2006   Fatigue 05/18/2017   GERD (gastroesophageal reflux disease)    Gout 05/12/2018   Gouty arthritis of right great toe 09/03/2017   Left toe Injected January 31, 2018    Hyperpigmentation 11/04/2017   Internal hemorrhoids 08/15/2016   Left inguinal hernia 07/01/2019   Lumbar post-laminectomy syndrome 12/12/2017   Moderate aortic regurgitation 07/04/2017  Echo 06/2017:  EF 55-60%, mild LVH, grade 1 DD, mild AS, mod AR, mild MR, mild-mod TR   Myocardial infarction    Neck injury    a. C3-C4 and C4-C5 foraminal narrowing, severe   Numbness of left foot 10/21/2019   Pacemaker 04/10/2012   Medtronic pacemaker  Last Assessment & Plan:  Status post pacemaker implantation for symptomatic bradycardia.  Pacemaker site is slightly tender and erythematous.  A prescription of Keflex was dispensed.  I also asked the patient to closely followup with the EP service regarding his pacemaker  placement. 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  This was switched to by mouth amiodarone at the time of the thoracic surgery appointment was discontinued.   Presence of aortocoronary bypass graft 10/24/2011   Overview:  Performed at [facility] 2013 Last Assessment & Plan:  No complication post coronary bypass grafting.   Prostate cancer ([place])    a. 2001 s/p TURP.   Pulmonary nodule    a. felt to be noncancerous.  Status post followup CT scan 4 mm and stable.   Renal artery stenosis    a. 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   Trochanteric bursitis of hip, bilateral 01/10/2017   UGIB (upper gastrointestinal bleed) 02/01/2018   EGD  02/06/18 - mod, non-erosive gastritis   Venous insufficiency of leg 06/05/2010   Vitamin B12 deficiency 08/23/2009   Jan '14  July '14 B12 level  >1500    472   Weakness of both lower extremities 04/09/2020   Past Surgical History:  Procedure Laterality Date   ANTERIOR CHAMBER WASHOUT Left 10/04/2018   Procedure: Anterior Chamber Washout, Vitreous Tap;  Surgeon: Jalene Mullet, MD;  Location: [place];  Service: Ophthalmology;  Laterality: Left;   cardia catherization  07-07-99   CARDIAC SURGERY  10/18/12   open heart surgery   CATARACT EXTRACTION W/ INTRAOCULAR LENS  IMPLANT, BILATERAL  3/205, 06/2013   [facility]    COLONOSCOPY  04/12/07   CORONARY ARTERY BYPASS GRAFT  10/19/2011   Procedure: CORONARY ARTERY BYPASS GRAFTING (CABG);  Surgeon: Gaye Pollack, MD;  Location: [place];  Service: Open Heart Surgery;  Laterality: N/A;  times three using Left Internal Mammary Artery and Right Greater Saphenouse Vein Graft harvested Endoscopically   edg  07-17-1994   FLEXIBLE SIGMOIDOSCOPY  11-03-1997   GAS INSERTION Left 10/04/2018   Procedure: Insertion Of Gas;  Surgeon: Jalene Mullet, MD;  Location: [place];  Service: Ophthalmology;  Laterality: Left;   GAS/FLUID EXCHANGE Left 10/04/2018   Procedure: Gas/Fluid Exchange;  Surgeon: Jalene Mullet, MD;  Location: [place];  Service: Ophthalmology;  Laterality: Left;   LEFT HEART CATHETERIZATION WITH CORONARY ANGIOGRAM N/A 10/16/2011   Procedure: LEFT HEART CATHETERIZATION WITH CORONARY ANGIOGRAM;  Surgeon: Peter M Martinique, MD;  Location[facility] LAB;  Service: Cardiovascular;  Laterality: N/A;   LEFT HEART CATHETERIZATION WITH  CORONARY/GRAFT ANGIOGRAM N/A 03/11/2013   Procedure: LEFT HEART CATHETERIZATION WITH [name];  Surgeon: Blane Ohara, MD;  Location: [facility] CATH LAB;  Service: Cardiovascular;  Laterality: N/A;   lumbar spinal disk and neck fusion surgery     PACEMAKER INSERTION  03/28/12   PPM implanted for mobitz II AV block   PARS PLANA VITRECTOMY Left 10/04/2018   Procedure: PARS PLANA VITRECTOMY 25 GAUGE FOR ENDOPHTHALMITIS WITH INJECTION OF INTRAVITREAL ANTIBIOTIC;  Surgeon: Jalene Mullet, MD;  Location: [place];  Service: Ophthalmology;  Laterality: Left;   peripheral vascular catherization  11-24-03   PERMANENT PACEMAKER INSERTION N/A 03/28/2012   Procedure: PERMANENT PACEMAKER INSERTION;  Surgeon: Thompson Grayer, MD;  Location: [facility] CATH LAB;  Service: Cardiovascular;  Laterality: N/A;   PROSTATECTOMY     renal circulation  10-01-03   s/p ptca     stents     X 2   stress cardiolite  05-04-05   spring 09-negative except for apical thinning, EF 68%    Patient Active Problem List   Diagnosis Date Noted   Gait abnormality 09/21/2021   History of cervical discectomy 09/21/2021   Polyarthralgia    Chronic gouty arthritis    Degeneration of lumbar intervertebral disc    Bilateral plantar fasciitis 10/25/2020   Weakness of both lower extremities 04/09/2020   S/P inguinal hernia repair 08/13/2019   Left inguinal hernia 07/01/2019   Gout 05/12/2018   Chronic diastolic CHF (congestive heart failure) 02/05/2018   UGIB (upper gastrointestinal bleed) 02/01/2018   Lumbar post-laminectomy syndrome 12/12/2017   Hyperpigmentation 11/04/2017   Spinal stenosis of lumbar region 10/09/2017   Frequent urination 09/03/2017   Gouty arthritis of right great toe 09/03/2017   Moderate aortic regurgitation 16/10/9602   Diastolic dysfunction 54/09/8117   Edema 06/29/2017   Trochanteric bursitis of hip, bilateral 01/10/2017   Internal hemorrhoids 08/15/2016   Cephalalgia 07/14/2015   Constipation 03/01/2015   CHB (complete heart block) 05/03/2012   Postoperative atrial fibrillation 05/03/2012   Pacemaker 04/10/2012   AV block, 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 03/26/2012   Coronary atherosclerosis 11/27/2011   Presence of aortocoronary bypass graft 10/24/2011   Pulmonary nodule    CAD (coronary artery disease)    CKD (chronic kidney disease) stage 3, GFR 30-59 ml/min    Venous insufficiency of leg 06/05/2010   Insomnia 11/09/2009   Pain in joint, shoulder region 10/14/2009   Vitamin B12 deficiency 08/23/2009   Peripheral neuropathy 03/21/2009   Degenerative disc disease, cervical, with radiculopathy 09/21/2008   Vertigo 08/29/2007   Dyslipidemia 01/17/2007   Essential hypertension 07/20/2006   Prostate cancer 07/20/2006    ONSET DATE: BLE issues for years,   REFERRING DIAG: R26.9 (ICD-10-CM) - Gait abnormality Z98.890 (ICD-10-CM) - History of cervical discectomy   THERAPY DIAG:  Unsteadiness on feet  Other abnormalities of gait and  mobility  Difficulty in walking, not elsewhere classified  Rationale for Evaluation and Treatment Rehabilitation  SUBJECTIVE:  SUBJECTIVE STATEMENT: Having problems with dizziness/vertigo and trouble with the legs from the knees down. Notes the vertigo primarily occurs when getting in/out of bed.  LE issues have been present since July 2020 where the go numb and the soles of feet have burning pain. Notes that with walking his calves become tight and restricted, notes this sensation does not always relieve with sitting  Pt accompanied by: significant other  PERTINENT HISTORY: see PMH  PAIN:  Are you having pain? No  PRECAUTIONS: Fall  WEIGHT BEARING RESTRICTIONS No  FALLS: Has patient fallen in last 6 months? Yes. Number of falls 1  LIVING ENVIRONMENT: Lives with: lives with their spouse Lives in: House/apartment Stairs: Yes: External: 3 steps; can reach both Has following equipment at home: Single point cane  PLOF: Independent with basic ADLs, Independent with household mobility with device, and Requires assistive device for independence  PATIENT GOALS improve balance, eliminate dizziness  OBJECTIVE:   Vestibular assessment:  (has macular degeneration, wears trifocals)  Oculomotor:  Convergence 8 inches Saccades WNL Smooth pursuits WNL VOR: NT VOR Cancellation: NT Head Impulse Test: negative, but difficulty in performing due to restricted neck movements and pt guarding against passive movement  Roll Test left/right: negative Dix-Hallpike left/right: negative--very limited neck extension Sidelying test left/right: negative--very limited neck extension   DIAGNOSTIC FINDINGS: IMPRESSION: cervical CT 1. No evidence of acute fracture or traumatic malalignment. 2. Similar severe  multilevel degenerative change.  COGNITION: Overall cognitive status: Within functional limits for tasks assessed   SENSATION: WFL  COORDINATION: WNL  EDEMA:    MUSCLE TONE: DNT   DTRs:  DNT  POSTURE: rounded shoulders and forward head  LOWER EXTREMITY ROM:     WFL  LOWER EXTREMITY MMT:    BLE gross strength assessed in sitting and demo 5/5  BED MOBILITY:  Modified indepdent  TRANSFERS: Assistive device utilized: None  Sit to stand: Complete Independence Stand to sit: Complete Independence Chair to chair: Modified independence Floor:  DNT    STAIRS:  Level of Assistance:  DNT    GAIT: Gait pattern: step through pattern Distance walked:  Assistive device utilized: Single point cane Level of assistance: SBA Comments: unsteady in turns  FUNCTIONAL TESTs:  5 times sit to stand: 15.25 sec  M-CTSIB  Condition 1: Firm Surface, EO 30 Sec, Normal Sway  Condition 2: Firm Surface, EC 30 Sec, Normal and Mild Sway  Condition 3: Foam Surface, EO 30 Sec, Mild Sway  Condition 4: Foam Surface, EC 30 Sec, Mild/Moderate Sway   Berg Balance Test: 41/56 Dynamic Gait Index: NT    PATIENT EDUCATION: Education details: continued assessment Person educated: Patient and Spouse Education method: Explanation Education comprehension: verbalized understanding   HOME EXERCISE PROGRAM: TBD    GOALS: Goals reviewed with patient? Yes  SHORT TERM GOALS: Target date: 10/18/2021  The patient will be independent with HEP for gaze adaptation, habituation, balance, and general mobility. Baseline: Goal status: INITIAL  LONG TERM GOALS: Target date: 11/08/2021  Demonstrate reduced risk for falls per Merrilee Jansky Balance Test score 50/56 Baseline: 41/56 Goal status: INITIAL  2.  Demonstrate low risk for falls per Dynamic Gait Index > 19/24 Baseline: NT Goal status: INITIAL  ASSESSMENT:  CLINICAL IMPRESSION: Patient is a 88 y.o. male who was seen today for physical  therapy evaluation and treatment for gait abnormality. Pt reports symptoms consistent with positional vertigo and demonstrates balance deficits w/ high risk for falls per [facility] Test.  Continued sessions indicated to reduce symptoms  and improve functional mobility to reduce risk for falls   OBJECTIVE IMPAIRMENTS Abnormal gait, decreased activity tolerance, decreased balance, difficulty walking, decreased ROM, dizziness, hypomobility, and postural dysfunction.   ACTIVITY LIMITATIONS carrying, lifting, bending, standing, sleeping, transfers, bed mobility, and locomotion level  PARTICIPATION LIMITATIONS: meal prep, cleaning, shopping, community activity, and yard work  PERSONAL FACTORS Age and Time since onset of injury/illness/exacerbation are also affecting patient's functional outcome.   REHAB POTENTIAL: Good  CLINICAL DECISION MAKING: Evolving/moderate complexity  EVALUATION COMPLEXITY: Moderate  PLAN: PT FREQUENCY: 1-2x/week  PT DURATION: 6 weeks  PLANNED INTERVENTIONS: Therapeutic exercises, Therapeutic activity, Neuromuscular re-education, Balance training, Gait training, Patient/Family education, Self Care, Joint mobilization, Stair training, Vestibular training, Canalith repositioning, Aquatic Therapy, Dry Needling, Spinal mobilization, Cryotherapy, Moist heat, Traction, and Manual therapy  PLAN FOR NEXT SESSION: re-test for BPPV, Dynamic Gait Index, initiate balance HEP   5:05 PM, 09/27/21 M. Sherlyn Lees, PT, DPT Physical Therapist- [place] Office Number: [phone]

## 2021-09-28 ENCOUNTER — Other Ambulatory Visit: Payer: Self-pay | Admitting: Emergency Medicine

## 2021-09-28 DIAGNOSIS — R42 Dizziness and giddiness: Secondary | ICD-10-CM

## 2021-09-28 NOTE — Telephone Encounter (Signed)
Pt verified by name and DOB, results given per provider, pt voiced understanding all question answered. 

## 2021-09-29 ENCOUNTER — Ambulatory Visit (INDEPENDENT_AMBULATORY_CARE_PROVIDER_SITE_OTHER): Payer: Medicare Other | Admitting: *Deleted

## 2021-09-29 DIAGNOSIS — Z23 Encounter for immunization: Secondary | ICD-10-CM

## 2021-10-02 ENCOUNTER — Ambulatory Visit: Payer: Medicare Other

## 2021-10-02 DIAGNOSIS — R2689 Other abnormalities of gait and mobility: Secondary | ICD-10-CM

## 2021-10-02 DIAGNOSIS — R2681 Unsteadiness on feet: Secondary | ICD-10-CM

## 2021-10-02 DIAGNOSIS — R262 Difficulty in walking, not elsewhere classified: Secondary | ICD-10-CM

## 2021-10-02 NOTE — Therapy (Signed)
OUTPATIENT PHYSICAL THERAPY NEURO TREATMENT   Patient Name: [name] MRN: [id number] DOB:28-Nov-1926, 88 y.o., male Today's Date: 10/02/2021   PCP: [name], MD REFERRING PROVIDER: Marcial Pacas, MD    PT End of Session - 10/02/21 1533     Visit Number 2    Number of Visits 12    Date for PT Re-Evaluation 11/08/21    Authorization Type Medicare/Tricare    PT Start Time 1530    PT Stop Time 1615    PT Time Calculation (min) 45 min    Activity Tolerance Patient tolerated treatment well    Behavior During Therapy WFL for tasks assessed/performed             Past Medical History:  Diagnosis Date   Acute superficial venous thrombosis of lower extremity    a. RLE after CABG, neg dopp for DVT.   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 S/P stenting to mid RCA, prox PDA 06/1999. b. NSTEMI/CABG x 3 in 10/2011 with LIMA to LAD, SVG to PDA, and SVG to OM1.    Cephalalgia 07/14/2015   CHB (complete heart block) 05/03/2012   Overview:  Status post complete heart block heart rate 28 bpm, alternating with 2 to one AV block and drug for bradycardia.  Status post pacemaker implantation.status post Medtronic pacemaker implantation the 03/28/2012   Chronic diastolic CHF (congestive heart failure) 02/05/2018   Chronic gouty arthritis    Chronic UTI    a. Followed by Dr. Risa Grill - colonized/asymptomatic - not on abx   CKD (chronic kidney disease)    stage 3, GFR 30-59 ml/min; stable with a creatinine around 1.9-2.0 followed by nephrology.   Constipation 03/01/2015   Symptoms and exam consistent with constipation. Abdominal exam is benign with no evidence of pain or obstruction. Discussed importance of increasing fiber and water intake coupled with physical activity to assist with bowel movements. Continue over-the-counter medication management as needed. Follow-up  if symptoms worsen or fail to improve.   Coronary atherosclerosis 11/27/2011   Overview:  Multivessel coronary artery disease recently diagnosed by catheterization 2013 History of stent placement with a heparin-coated stent 2001  Last Assessment & Plan:  Status post coronary bypass grafting.  No recurrent chest pain. Overview:  Overview:  S/P stenting to mid RCA, prox PDA 06/1999;  06/2007 Myoview: negative except for apical thinning, EF 68%, last Myoview August 10, 2008 with n   Degeneration of lumbar intervertebral disc    Degenerative disc disease, cervical, with radiculopathy 29/47/6546   Diastolic dysfunction 50/35/4656   Grade 1 DD on Echo 06/2017   Dyslipidemia    Dysphagia 02/06/2016   3/18 - DG Esophagus:  1. Mild esophageal dysmotility, likely presbyesophagus. 2. No other explanation for patient's symptoms. 3. Small hiatal hernia.  On swallow eval - evidence of cervical spine disease and that was likely contributing   Echocardiogram    Echocardiogram 08/2018: EF 50-55, inf-lat AK, mild LVH, Gr 1 DD, RVSP 47, mild LAE, mild to mod MR, mild AI, mild AS (mean 11)   Epistaxis 12/08/2020   Essential hypertension 07/20/2006   Fatigue 05/18/2017   GERD (gastroesophageal reflux disease)    Gout 05/12/2018   Gouty arthritis of right great toe 09/03/2017   Left toe Injected January 31, 2018    Hyperpigmentation 11/04/2017   Internal hemorrhoids 08/15/2016  Left inguinal hernia 07/01/2019   Lumbar post-laminectomy syndrome 12/12/2017   Moderate aortic regurgitation 07/04/2017   Echo 06/2017:  EF 55-60%, mild LVH, grade 1 DD, mild AS, mod AR, mild MR, mild-mod TR   Myocardial infarction    Neck injury    a. C3-C4 and C4-C5 foraminal narrowing, severe   Numbness of left foot 10/21/2019   Pacemaker 04/10/2012   Medtronic pacemaker  Last Assessment & Plan:  Status post pacemaker implantation for symptomatic bradycardia.  Pacemaker site is slightly tender and erythematous.  A prescription of  Keflex was dispensed.  I also asked the patient to closely followup with the EP service regarding his pacemaker placement. 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  This was switched to by mouth amiodarone at the time of the thoracic surgery appointment was discontinued.   Presence of aortocoronary bypass graft 10/24/2011   Overview:  Performed at [facility] 2013 Last Assessment & Plan:  No complication post coronary bypass grafting.   Prostate cancer ([place])    a. 2001 s/p TURP.   Pulmonary nodule    a. felt to be noncancerous.  Status post followup CT scan 4 mm and stable.   Renal artery stenosis    a. 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   Trochanteric bursitis of hip, bilateral 01/10/2017   UGIB (upper gastrointestinal bleed) 02/01/2018   EGD  02/06/18 - mod, non-erosive gastritis   Venous insufficiency of leg 06/05/2010   Vitamin B12 deficiency 08/23/2009   Jan '14  July '14 B12 level  >1500    472   Weakness of both lower extremities 04/09/2020   Past Surgical History:  Procedure Laterality Date   ANTERIOR CHAMBER WASHOUT Left 10/04/2018   Procedure: Anterior Chamber Washout, Vitreous Tap;  Surgeon: Jalene Mullet, MD;  Location: [place];  Service: Ophthalmology;  Laterality: Left;   cardia catherization  07-07-99   CARDIAC SURGERY  10/18/12   open  heart surgery   CATARACT EXTRACTION W/ INTRAOCULAR LENS  IMPLANT, BILATERAL  3/205, 06/2013   [facility]   COLONOSCOPY  04/12/07   CORONARY ARTERY BYPASS GRAFT  10/19/2011   Procedure: CORONARY ARTERY BYPASS GRAFTING (CABG);  Surgeon: Gaye Pollack, MD;  Location: [place];  Service: Open Heart Surgery;  Laterality: N/A;  times three using Left Internal Mammary Artery and Right Greater Saphenouse Vein Graft harvested Endoscopically   edg  07-17-1994   FLEXIBLE SIGMOIDOSCOPY  11-03-1997   GAS INSERTION Left 10/04/2018   Procedure: Insertion Of Gas;  Surgeon: Jalene Mullet, MD;  Location: [place];  Service: Ophthalmology;  Laterality: Left;   GAS/FLUID EXCHANGE Left 10/04/2018   Procedure: Gas/Fluid Exchange;  Surgeon: Jalene Mullet, MD;  Location: [place];  Service: Ophthalmology;  Laterality: Left;   LEFT HEART CATHETERIZATION WITH CORONARY ANGIOGRAM N/A 10/16/2011   Procedure: LEFT HEART CATHETERIZATION WITH CORONARY ANGIOGRAM;  Surgeon: Peter M Martinique,  MD;  Location: [place] CATH LAB;  Service: Cardiovascular;  Laterality: N/A;   LEFT HEART CATHETERIZATION WITH CORONARY/GRAFT ANGIOGRAM N/A 03/11/2013   Procedure: LEFT HEART CATHETERIZATION WITH [name];  Surgeon: Blane Ohara, MD;  Location: [facility] CATH LAB;  Service: Cardiovascular;  Laterality: N/A;   lumbar spinal disk and neck fusion surgery     PACEMAKER INSERTION  03/28/12   PPM implanted for mobitz II AV block   PARS PLANA VITRECTOMY Left 10/04/2018   Procedure: PARS PLANA VITRECTOMY 25 GAUGE FOR ENDOPHTHALMITIS WITH INJECTION OF INTRAVITREAL ANTIBIOTIC;  Surgeon: Jalene Mullet, MD;  Location: [place];  Service: Ophthalmology;  Laterality: Left;   peripheral vascular catherization  11-24-03   PERMANENT PACEMAKER INSERTION N/A 03/28/2012   Procedure: PERMANENT PACEMAKER INSERTION;  Surgeon: Thompson Grayer, MD;  Location: [facility] CATH LAB;  Service: Cardiovascular;  Laterality: N/A;   PROSTATECTOMY     renal circulation  10-01-03   s/p ptca      stents     X 2   stress cardiolite  05-04-05   spring 09-negative except for apical thinning, EF 68%   Patient Active Problem List   Diagnosis Date Noted   Gait abnormality 09/21/2021   History of cervical discectomy 09/21/2021   Polyarthralgia    Chronic gouty arthritis    Degeneration of lumbar intervertebral disc    Bilateral plantar fasciitis 10/25/2020   Weakness of both lower extremities 04/09/2020   S/P inguinal hernia repair 08/13/2019   Left inguinal hernia 07/01/2019   Gout 05/12/2018   Chronic diastolic CHF (congestive heart failure) 02/05/2018   UGIB (upper gastrointestinal bleed) 02/01/2018   Lumbar post-laminectomy syndrome 12/12/2017   Hyperpigmentation 11/04/2017   Spinal stenosis of lumbar region 10/09/2017   Frequent urination 09/03/2017   Gouty arthritis of right great toe 09/03/2017   Moderate aortic regurgitation 64/15/8309   Diastolic dysfunction 40/76/8088   Edema 06/29/2017   Trochanteric bursitis of hip, bilateral 01/10/2017   Internal hemorrhoids 08/15/2016   Cephalalgia 07/14/2015   Constipation 03/01/2015   CHB (complete heart block) 05/03/2012   Postoperative atrial fibrillation 05/03/2012   Pacemaker 04/10/2012   AV block, 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 03/26/2012   Coronary atherosclerosis 11/27/2011   Presence of aortocoronary bypass graft 10/24/2011   Pulmonary nodule    CAD (coronary artery disease)    CKD (chronic kidney disease) stage 3, GFR 30-59 ml/min    Venous insufficiency of leg 06/05/2010   Insomnia 11/09/2009   Pain in joint, shoulder region 10/14/2009   Vitamin B12 deficiency 08/23/2009   Peripheral neuropathy 03/21/2009   Degenerative disc disease, cervical, with radiculopathy 09/21/2008   Vertigo 08/29/2007   Dyslipidemia 01/17/2007   Essential hypertension 07/20/2006   Prostate cancer 07/20/2006    ONSET DATE: BLE issues for years,   REFERRING DIAG: R26.9 (ICD-10-CM) - Gait abnormality Z98.890 (ICD-10-CM) -  History of cervical discectomy   THERAPY DIAG:  Unsteadiness on feet  Other abnormalities of gait and mobility  Difficulty in walking, not elsewhere classified  Rationale for Evaluation and Treatment Rehabilitation  SUBJECTIVE:  SUBJECTIVE STATEMENT: Had a pretty good weekend. No obvious dizziness experienced, using Meclazine before bed.  Pt accompanied by: significant other  PERTINENT HISTORY: see PMH  PAIN:  Are you having pain? No  PRECAUTIONS: Fall  WEIGHT BEARING RESTRICTIONS No  FALLS: Has patient fallen in last 6 months? Yes. Number of falls 1  LIVING ENVIRONMENT: Lives with: lives with their spouse Lives in: House/apartment Stairs: Yes: External: 3 steps; can reach both Has following equipment at home: Single point cane  PLOF: Independent with basic ADLs, Independent with household mobility with device, and Requires assistive device for independence  PATIENT GOALS improve balance, eliminate dizziness  OBJECTIVE:   TODAY'S TREATMENT: 10/02/21 Activity Comments  DGI  12/24  FGA 13/28  Corner balance Feet together: EO/EC x 15 sec; head turns EO/EC; semi-tandem w/ lateral LOB            Home Exercise Program Access Code: [facility] URL: [email or web address] Date: 10/02/2021 Prepared by: Sherlyn Lees  Exercises - Corner Balance Feet Together With Eyes Open  - 1 x daily - 7 x weekly - 3 sets - 15-30 sec hold - Corner Balance Feet Together With Eyes Closed  - 1 x daily - 7 x weekly - 3 sets - 15-30 sec hold - Corner Balance Feet Together: Eyes Open With Head Turns  - 1 x daily - 7 x weekly - 3 sets - 5 repetitions hold - Corner Balance Feet Together: Eyes Closed With Head Turns  - 1 x daily - 7 x weekly - 3 sets - 5 reps - Semi-Tandem Corner Balance With Eyes  Open  - 1 x daily - 7 x weekly - 3 sets - 15-30 sec hold - Semi-Tandem Corner Balance With Eyes Closed  - 1 x daily - 7 x weekly - 3 sets - 15-30 sec hold  Vestibular assessment:  (has macular degeneration, wears trifocals)  Oculomotor:  Convergence 8 inches Saccades WNL Smooth pursuits WNL VOR: NT VOR Cancellation: NT Head Impulse Test: negative, but difficulty in performing due to restricted neck movements and pt guarding against passive movement  Roll Test left/right: negative Dix-Hallpike left/right: negative--very limited neck extension Sidelying test left/right: negative--very limited neck extension   DIAGNOSTIC FINDINGS: IMPRESSION: cervical CT 1. No evidence of acute fracture or traumatic malalignment. 2. Similar severe multilevel degenerative change.  COGNITION: Overall cognitive status: Within functional limits for tasks assessed   SENSATION: WFL  COORDINATION: WNL  EDEMA:    MUSCLE TONE: DNT   DTRs:  DNT  POSTURE: rounded shoulders and forward head  LOWER EXTREMITY ROM:     WFL  LOWER EXTREMITY MMT:    BLE gross strength assessed in sitting and demo 5/5  BED MOBILITY:  Modified indepdent  TRANSFERS: Assistive device utilized: None  Sit to stand: Complete Independence Stand to sit: Complete Independence Chair to chair: Modified independence Floor:  DNT    STAIRS:  Level of Assistance:  DNT    GAIT: Gait pattern: step through pattern Distance walked:  Assistive device utilized: Single point cane Level of assistance: SBA Comments: unsteady in turns  FUNCTIONAL TESTs:  5 times sit to stand: 15.25 sec  M-CTSIB  Condition 1: Firm Surface, EO 30 Sec, Normal Sway  Condition 2: Firm Surface, EC 30 Sec, Normal and Mild Sway  Condition 3: Foam Surface, EO 30 Sec, Mild Sway  Condition 4: Foam Surface, EC 30 Sec, Mild/Moderate Sway   Berg Balance Test: 41/56 Dynamic Gait Index: NT    PATIENT EDUCATION: Education details:  continued  assessment Person educated: Patient and Spouse Education method: Explanation Education comprehension: verbalized understanding      GOALS: Goals reviewed with patient? Yes  SHORT TERM GOALS: Target date: 10/18/2021  The patient will be independent with HEP for gaze adaptation, habituation, balance, and general mobility. Baseline: Goal status: INITIAL  LONG TERM GOALS: Target date: 11/08/2021  Demonstrate reduced risk for falls per Merrilee Jansky Balance Test score 50/56 Baseline: 41/56 Goal status: INITIAL  2.  Demonstrate low risk for falls per Dynamic Gait Index > 19/24 Baseline: NT Goal status: INITIAL  ASSESSMENT:  CLINICAL IMPRESSION: Completed Dynamic Gait Index and FGA with scores revealing high risk for falls.  Initiated HEP with corner balance exercises to improve safety with narrow BOS and eyes closed conditions.  Pt reports no re-appearance of positional vertigo as of this past weekend. Continued sessions to progress balance activities and vestibular activiteis as indicated   OBJECTIVE IMPAIRMENTS Abnormal gait, decreased activity tolerance, decreased balance, difficulty walking, decreased ROM, dizziness, hypomobility, and postural dysfunction.   ACTIVITY LIMITATIONS carrying, lifting, bending, standing, sleeping, transfers, bed mobility, and locomotion level  PARTICIPATION LIMITATIONS: meal prep, cleaning, shopping, community activity, and yard work  PERSONAL FACTORS Age and Time since onset of injury/illness/exacerbation are also affecting patient's functional outcome.   REHAB POTENTIAL: Good  CLINICAL DECISION MAKING: Evolving/moderate complexity  EVALUATION COMPLEXITY: Moderate  PLAN: PT FREQUENCY: 1-2x/week  PT DURATION: 6 weeks  PLANNED INTERVENTIONS: Therapeutic exercises, Therapeutic activity, Neuromuscular re-education, Balance training, Gait training, Patient/Family education, Self Care, Joint mobilization, Stair training, Vestibular training, Canalith  repositioning, Aquatic Therapy, Dry Needling, Spinal mobilization, Cryotherapy, Moist heat, Traction, and Manual therapy  PLAN FOR NEXT SESSION: VOR testing , progress  balance HEP   3:34 PM, 10/02/21 M. Sherlyn Lees, PT, DPT Physical Therapist- [place] Office Number: [phone]

## 2021-10-03 NOTE — Therapy (Signed)
OUTPATIENT PHYSICAL THERAPY NEURO TREATMENT   Patient Name: [name] MRN: [id number] DOB:03-10-1926, 88 y.o., male Today's Date: 10/04/2021   PCP: [name], MD REFERRING PROVIDER: Marcial Pacas, MD    PT End of Session - 10/04/21 1658     Visit Number 3    Number of Visits 12    Date for PT Re-Evaluation 11/08/21    Authorization Type Medicare/Tricare    PT Start Time 1616    PT Stop Time 1657    PT Time Calculation (min) 41 min    Activity Tolerance Patient tolerated treatment well    Behavior During Therapy WFL for tasks assessed/performed              Past Medical History:  Diagnosis Date   Acute superficial venous thrombosis of lower extremity    a. RLE after CABG, neg dopp for DVT.   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 S/P stenting to mid RCA, prox PDA 06/1999. b. NSTEMI/CABG x 3 in 10/2011 with LIMA to LAD, SVG to PDA, and SVG to OM1.    Cephalalgia 07/14/2015   CHB (complete heart block) 05/03/2012   Overview:  Status post complete heart block heart rate 28 bpm, alternating with 2 to one AV block and drug for bradycardia.  Status post pacemaker implantation.status post Medtronic pacemaker implantation the 03/28/2012   Chronic diastolic CHF (congestive heart failure) 02/05/2018   Chronic gouty arthritis    Chronic UTI    a. Followed by Dr. Risa Grill - colonized/asymptomatic - not on abx   CKD (chronic kidney disease)    stage 3, GFR 30-59 ml/min; stable with a creatinine around 1.9-2.0 followed by nephrology.   Constipation 03/01/2015   Symptoms and exam consistent with constipation. Abdominal exam is benign with no evidence of pain or obstruction. Discussed importance of increasing fiber and water intake coupled with physical activity to assist with bowel movements. Continue over-the-counter medication management as needed.  Follow-up if symptoms worsen or fail to improve.   Coronary atherosclerosis 11/27/2011   Overview:  Multivessel coronary artery disease recently diagnosed by catheterization 2013 History of stent placement with a heparin-coated stent 2001  Last Assessment & Plan:  Status post coronary bypass grafting.  No recurrent chest pain. Overview:  Overview:  S/P stenting to mid RCA, prox PDA 06/1999;  06/2007 Myoview: negative except for apical thinning, EF 68%, last Myoview August 10, 2008 with n   Degeneration of lumbar intervertebral disc    Degenerative disc disease, cervical, with radiculopathy 91/63/8466   Diastolic dysfunction 59/93/5701   Grade 1 DD on Echo 06/2017   Dyslipidemia    Dysphagia 02/06/2016   3/18 - DG Esophagus:  1. Mild esophageal dysmotility, likely presbyesophagus. 2. No other explanation for patient's symptoms. 3. Small hiatal hernia.  On swallow eval - evidence of cervical spine disease and that was likely contributing   Echocardiogram    Echocardiogram 08/2018: EF 50-55, inf-lat AK, mild LVH, Gr 1 DD, RVSP 47, mild LAE, mild to mod MR, mild AI, mild AS (mean 11)   Epistaxis 12/08/2020   Essential hypertension 07/20/2006   Fatigue 05/18/2017   GERD (gastroesophageal reflux disease)    Gout 05/12/2018   Gouty arthritis of right great toe 09/03/2017   Left toe Injected January 31, 2018    Hyperpigmentation 11/04/2017   Internal hemorrhoids  08/15/2016   Left inguinal hernia 07/01/2019   Lumbar post-laminectomy syndrome 12/12/2017   Moderate aortic regurgitation 07/04/2017   Echo 06/2017:  EF 55-60%, mild LVH, grade 1 DD, mild AS, mod AR, mild MR, mild-mod TR   Myocardial infarction    Neck injury    a. C3-C4 and C4-C5 foraminal narrowing, severe   Numbness of left foot 10/21/2019   Pacemaker 04/10/2012   Medtronic pacemaker  Last Assessment & Plan:  Status post pacemaker implantation for symptomatic bradycardia.  Pacemaker site is slightly tender and erythematous.  A  prescription of Keflex was dispensed.  I also asked the patient to closely followup with the EP service regarding his pacemaker placement. 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  This was switched to by mouth amiodarone at the time of the thoracic surgery appointment was discontinued.   Presence of aortocoronary bypass graft 10/24/2011   Overview:  Performed at [facility] 2013 Last Assessment & Plan:  No complication post coronary bypass grafting.   Prostate cancer ([place])    a. 2001 s/p TURP.   Pulmonary nodule    a. felt to be noncancerous.  Status post followup CT scan 4 mm and stable.   Renal artery stenosis    a. 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   Trochanteric bursitis of hip, bilateral 01/10/2017   UGIB (upper gastrointestinal bleed) 02/01/2018   EGD  02/06/18 - mod, non-erosive gastritis   Venous insufficiency of leg 06/05/2010   Vitamin B12 deficiency 08/23/2009   Jan '14  July '14 B12 level  >1500    472   Weakness of both lower extremities 04/09/2020   Past Surgical History:  Procedure Laterality Date   ANTERIOR CHAMBER WASHOUT Left 10/04/2018   Procedure: Anterior Chamber Washout, Vitreous Tap;  Surgeon: Jalene Mullet, MD;  Location: [place];  Service: Ophthalmology;  Laterality: Left;   cardia catherization  07-07-99   CARDIAC SURGERY   10/18/12   open heart surgery   CATARACT EXTRACTION W/ INTRAOCULAR LENS  IMPLANT, BILATERAL  3/205, 06/2013   [facility]   COLONOSCOPY  04/12/07   CORONARY ARTERY BYPASS GRAFT  10/19/2011   Procedure: CORONARY ARTERY BYPASS GRAFTING (CABG);  Surgeon: Gaye Pollack, MD;  Location: [place];  Service: Open Heart Surgery;  Laterality: N/A;  times three using Left Internal Mammary Artery and Right Greater [place]hen[place] Vein Graft harvested Endoscopically   edg  07-17-1994   FLEXIBLE SIGMOIDOSCOPY  11-03-1997   GAS INSERTION Left 10/04/2018   Procedure: Insertion Of Gas;  Surgeon: Jalene Mullet, MD;  Location: [place];  Service: Ophthalmology;  Laterality: Left;   GAS/FLUID EXCHANGE Left 10/04/2018   Procedure: Gas/Fluid Exchange;  Surgeon: Jalene Mullet, MD;  Location: [place];  Service: Ophthalmology;  Laterality: Left;   LEFT HEART CATHETERIZATION WITH CORONARY ANGIOGRAM N/A 10/16/2011   Procedure: LEFT HEART CATHETERIZATION WITH CORONARY ANGIOGRAM;  Surgeon:  Peter M Martinique, MD;  Location[facility] C[facility] LAB;  Service: Cardiovascular;  Laterality: N/A;   LEFT HEART CATHETERIZATION WITH CORONARY/GRAFT ANGIOGRAM N/A 03/11/2013   Procedure: LEFT HEART CATHETERIZATION WITH [name];  Surgeon: Blane Ohara, MD;  Location: [facility] CATH LAB;  Service: Cardiovascular;  Laterality: N/A;   lumbar spinal disk and neck fusion surgery     PACEMAKER INSERTION  03/28/12   PPM implanted for mobitz II AV block   PARS PLANA VITRECTOMY Left 10/04/2018   Procedure: PARS PLANA VITRECTOMY 25 GAUGE FOR ENDOPHTHALMITIS WITH INJECTION OF INTRAVITREAL ANTIBIOTIC;  Surgeon: Jalene Mullet, MD;  Location: [place];  Service: Ophthalmology;  Laterality: Left;   peripheral vascular catherization  11-24-03   PERMANENT PACEMAKER INSERTION N/A 03/28/2012   Procedure: PERMANENT PACEMAKER INSERTION;  Surgeon: Thompson Grayer, MD;  Location: [facility] CATH LAB;  Service: Cardiovascular;  Laterality: N/A;   PROSTATECTOMY     renal circulation  10-01-03    s/p ptca     stents     X 2   stress cardiolite  05-04-05   spring 09-negative except for apical thinning, EF 68%   Patient Active Problem List   Diagnosis Date Noted   Gait abnormality 09/21/2021   History of cervical discectomy 09/21/2021   Polyarthralgia    Chronic gouty arthritis    Degeneration of lumbar intervertebral disc    Bilateral plantar fasciitis 10/25/2020   Weakness of both lower extremities 04/09/2020   S/P inguinal hernia repair 08/13/2019   Left inguinal hernia 07/01/2019   Gout 05/12/2018   Chronic diastolic CHF (congestive heart failure) 02/05/2018   UGIB (upper gastrointestinal bleed) 02/01/2018   Lumbar post-laminectomy syndrome 12/12/2017   Hyperpigmentation 11/04/2017   Spinal stenosis of lumbar region 10/09/2017   Frequent urination 09/03/2017   Gouty arthritis of right great toe 09/03/2017   Moderate aortic regurgitation 61/95/0932   Diastolic dysfunction 67/12/4578   Edema 06/29/2017   Trochanteric bursitis of hip, bilateral 01/10/2017   Internal hemorrhoids 08/15/2016   Cephalalgia 07/14/2015   Constipation 03/01/2015   CHB (complete heart block) 05/03/2012   Postoperative atrial fibrillation 05/03/2012   Pacemaker 04/10/2012   AV block, 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 03/26/2012   Coronary atherosclerosis 11/27/2011   Presence of aortocoronary bypass graft 10/24/2011   Pulmonary nodule    CAD (coronary artery disease)    CKD (chronic kidney disease) stage 3, GFR 30-59 ml/min    Venous insufficiency of leg 06/05/2010   Insomnia 11/09/2009   Pain in joint, shoulder region 10/14/2009   Vitamin B12 deficiency 08/23/2009   Peripheral neuropathy 03/21/2009   Degenerative disc disease, cervical, with radiculopathy 09/21/2008   Vertigo 08/29/2007   Dyslipidemia 01/17/2007   Essential hypertension 07/20/2006   Prostate cancer 07/20/2006    ONSET DATE: BLE issues for years,   REFERRING DIAG: R26.9 (ICD-10-CM) - Gait abnormality Z98.890  (ICD-10-CM) - History of cervical discectomy   THERAPY DIAG:  Unsteadiness on feet  Other abnormalities of gait and mobility  Difficulty in walking, not elsewhere classified  Rationale for Evaluation and Treatment Rehabilitation  SUBJECTIVE:  SUBJECTIVE STATEMENT: "Did not sleep last night and had a L shot in the eye, so I may not perform as well today. Did the exercises that kelly gave me and that pooped me out." But denies dizziness with exercises. Last time he had dizziness was when getting out of bed over the weekend.  Pt accompanied by: significant other  PERTINENT HISTORY: see PMH  PAIN:  Are you having pain? Yes: NPRS scale: 4-5/10 Pain location: L eye Pain description: "eyelid is dragging" Aggravating factors: eye injection Relieving factors: n/a  PRECAUTIONS: Fall  PATIENT GOALS improve balance, eliminate dizziness  OBJECTIVE:      TODAY'S TREATMENT: 10/04/21 Activity Comments  L HIT Covert positive  R HIT Negative   L brandt daroff/sidelying test Very mild dizziness upon sitting up; no nystagmus  R brandt daroff/sidelying test Very mild dizziness upon sitting up; no nystagmus   L roll test negative  R roll test  Negative   Sitting VOR horizontal 30" No dizziness   Sitting VOR vertical 30" No dizziness      HOME EXERCISE PROGRAM Last updated: 10/04/21 Access Code: [email or web address] URL: [email or web address] Date: 10/04/2021 Prepared by: [facility]  Exercises - Corner Balance Feet Together With Eyes Open  - 1 x daily - 3 x weekly - 3 sets - 15-30 sec hold - Corner Balance Feet Together With Eyes Closed  - 1 x daily - 3 x weekly - 3 sets - 15-30 sec hold - Corner Balance Feet Together: Eyes Open With Head Turns  - 1 x daily  - 3 x weekly - 3 sets - 5 repetitions hold - Corner Balance Feet Together: Eyes Closed With Head Turns  - 1 x daily - 3 x weekly - 3 sets - 5 reps - Semi-Tandem Corner Balance With Eyes Open  - 1 x daily - 3 x weekly - 3 sets - 15-30 sec hold - Semi-Tandem Corner Balance With Eyes Closed  - 1 x daily - 3 x weekly - 3 sets - 15-30 sec hold - Brandt-Daroff Vestibular Exercise  - 1 x daily - 5 x weekly - 2 sets - 3-5 reps   PATIENT EDUCATION: Education details: discussed patient's concern about SOB with exertion- advised to discuss this with PCP and cardiology; discussed concerns about problems with word finding and suggested ST; HEP update and adjustment to frequency to avoid fatigue  Person educated: Patient Education method: Explanation, Demonstration, Tactile cues, Verbal cues, and Handouts Education comprehension: verbalized understanding and returned demonstration    Below measures were taken at time of initial evaluation unless otherwise specified:  Vestibular assessment:  (has macular degeneration, wears trifocals)  Oculomotor:  Convergence 8 inches Saccades WNL Smooth pursuits WNL VOR: NT VOR Cancellation: NT Head Impulse Test: negative, but difficulty in performing due to restricted neck movements and pt guarding against passive movement  Roll Test left/right: negative Dix-Hallpike left/right: negative--very limited neck extension Sidelying test left/right: negative--very limited neck extension   DIAGNOSTIC FINDINGS: IMPRESSION: cervical CT 1. No evidence of acute fracture or traumatic malalignment. 2. Similar severe multilevel degenerative change.  COGNITION: Overall cognitive status: Within functional limits for tasks assessed   SENSATION: WFL  COORDINATION: WNL  EDEMA:    MUSCLE TONE: DNT   DTRs:  DNT  POSTURE: rounded shoulders and forward head  LOWER EXTREMITY ROM:     WFL  LOWER EXTREMITY MMT:    BLE gross strength assessed in sitting and  demo 5/5  BED  MOBILITY:  Modified indepdent  TRANSFERS: Assistive device utilized: None  Sit to stand: Complete Independence Stand to sit: Complete Independence Chair to chair: Modified independence Floor:  DNT    STAIRS:  Level of Assistance:  DNT    GAIT: Gait pattern: step through pattern Distance walked:  Assistive device utilized: Single point cane Level of assistance: SBA Comments: unsteady in turns  FUNCTIONAL TESTs:  5 times sit to stand: 15.25 sec  M-CTSIB  Condition 1: Firm Surface, EO 30 Sec, Normal Sway  Condition 2: Firm Surface, EC 30 Sec, Normal and Mild Sway  Condition 3: Foam Surface, EO 30 Sec, Mild Sway  Condition 4: Foam Surface, EC 30 Sec, Mild/Moderate Sway   Berg Balance Test: 41/56 Dynamic Gait Index: NT    PATIENT EDUCATION: Education details: continued assessment Person educated: Patient and Spouse Education method: Explanation Education comprehension: verbalized understanding      GOALS: Goals reviewed with patient? Yes  SHORT TERM GOALS: Target date: 10/18/2021  The patient will be independent with HEP for gaze adaptation, habituation, balance, and general mobility. Baseline: Goal status: IN PROGRESS  LONG TERM GOALS: Target date: 11/08/2021  Demonstrate reduced risk for falls per Merrilee Jansky Balance Test score 50/56 Baseline: 41/56 Goal status: IN PROGRESS  2.  Demonstrate low risk for falls per Dynamic Gait Index > 19/24 Baseline: NT Goal status: IN PROGRESS  ASSESSMENT:  CLINICAL IMPRESSION: Patient arrived to session with report of not sleeping well last night and getting a L eye injection today. Also reports fatigue with his HEP. HIT on L was covertly positive, however positional testing was negative. Patient did note some mild dizziness upon sitting up from [place]- suspect motion sensitivity. Updated HEP to address this at home. Patient reported understanding of all edu provided and without complaints at end of  session.     OBJECTIVE IMPAIRMENTS Abnormal gait, decreased activity tolerance, decreased balance, difficulty walking, decreased ROM, dizziness, hypomobility, and postural dysfunction.   ACTIVITY LIMITATIONS carrying, lifting, bending, standing, sleeping, transfers, bed mobility, and locomotion level  PARTICIPATION LIMITATIONS: meal prep, cleaning, shopping, community activity, and yard work  PERSONAL FACTORS Age and Time since onset of injury/illness/exacerbation are also affecting patient's functional outcome.   REHAB POTENTIAL: Good  CLINICAL DECISION MAKING: Evolving/moderate complexity  EVALUATION COMPLEXITY: Moderate  PLAN: PT FREQUENCY: 1-2x/week  PT DURATION: 6 weeks  PLANNED INTERVENTIONS: Therapeutic exercises, Therapeutic activity, Neuromuscular re-education, Balance training, Gait training, Patient/Family education, Self Care, Joint mobilization, Stair training, Vestibular training, Canalith repositioning, Aquatic Therapy, Dry Needling, Spinal mobilization, Cryotherapy, Moist heat, Traction, and Manual therapy  PLAN FOR NEXT SESSION: VOR testing , progress  balance HEP  Janene Harvey, PT, DPT 10/04/21 5:06 PM  [facility] [address] [place], [place] [address] Phone # [phone] Fax # [phone]

## 2021-10-04 ENCOUNTER — Ambulatory Visit: Payer: Medicare Other | Admitting: Physical Therapy

## 2021-10-04 ENCOUNTER — Encounter: Payer: Self-pay | Admitting: Physical Therapy

## 2021-10-04 DIAGNOSIS — R2681 Unsteadiness on feet: Secondary | ICD-10-CM | POA: Diagnosis not present

## 2021-10-04 DIAGNOSIS — R2689 Other abnormalities of gait and mobility: Secondary | ICD-10-CM

## 2021-10-04 DIAGNOSIS — R262 Difficulty in walking, not elsewhere classified: Secondary | ICD-10-CM

## 2021-10-06 NOTE — Therapy (Signed)
OUTPATIENT PHYSICAL THERAPY NEURO TREATMENT   Patient Name: [name] MRN: [id number] DOB:1926/03/07, 88 y.o., male Today's Date: 10/06/2021   PCP: [name], MD REFERRING PROVIDER: Marcial Pacas, MD       Past Medical History:  Diagnosis Date   Acute superficial venous thrombosis of lower extremity    a. RLE after CABG, neg dopp for DVT.   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 S/P stenting to mid RCA, prox PDA 06/1999. b. NSTEMI/CABG x 3 in 10/2011 with LIMA to LAD, SVG to PDA, and SVG to OM1.    Cephalalgia 07/14/2015   CHB (complete heart block) 05/03/2012   Overview:  Status post complete heart block heart rate 28 bpm, alternating with 2 to one AV block and drug for bradycardia.  Status post pacemaker implantation.status post Medtronic pacemaker implantation the 03/28/2012   Chronic diastolic CHF (congestive heart failure) 02/05/2018   Chronic gouty arthritis    Chronic UTI    a. Followed by Dr. Risa Grill - colonized/asymptomatic - not on abx   CKD (chronic kidney disease)    stage 3, GFR 30-59 ml/min; stable with a creatinine around 1.9-2.0 followed by nephrology.   Constipation 03/01/2015   Symptoms and exam consistent with constipation. Abdominal exam is benign with no evidence of pain or obstruction. Discussed importance of increasing fiber and water intake coupled with physical activity to assist with bowel movements. Continue over-the-counter medication management as needed. Follow-up if symptoms worsen or fail to improve.   Coronary atherosclerosis 11/27/2011   Overview:  Multivessel coronary artery disease recently diagnosed by catheterization 2013 History of stent placement with a heparin-coated stent 2001  Last Assessment & Plan:  Status post coronary bypass grafting.  No recurrent chest pain. Overview:  Overview:  S/P stenting to mid RCA,  prox PDA 06/1999;  06/2007 Myoview: negative except for apical thinning, EF 68%, last Myoview August 10, 2008 with n   Degeneration of lumbar intervertebral disc    Degenerative disc disease, cervical, with radiculopathy 51/76/1607   Diastolic dysfunction 37/10/6267   Grade 1 DD on Echo 06/2017   Dyslipidemia    Dysphagia 02/06/2016   3/18 - DG Esophagus:  1. Mild esophageal dysmotility, likely presbyesophagus. 2. No other explanation for patient's symptoms. 3. Small hiatal hernia.  On swallow eval - evidence of cervical spine disease and that was likely contributing   Echocardiogram    Echocardiogram 08/2018: EF 50-55, inf-lat AK, mild LVH, Gr 1 DD, RVSP 47, mild LAE, mild to mod MR, mild AI, mild AS (mean 11)   Epistaxis 12/08/2020   Essential hypertension 07/20/2006   Fatigue 05/18/2017   GERD (gastroesophageal reflux disease)    Gout 05/12/2018   Gouty arthritis of right great toe 09/03/2017   Left toe Injected January 31, 2018    Hyperpigmentation 11/04/2017   Internal hemorrhoids 08/15/2016   Left inguinal hernia 07/01/2019   Lumbar post-laminectomy syndrome 12/12/2017   Moderate aortic regurgitation 07/04/2017   Echo 06/2017:  EF 55-60%, mild LVH, grade 1 DD, mild AS, mod AR, mild MR, mild-mod TR   Myocardial infarction    Neck injury    a. C3-C4 and C4-C5 foraminal narrowing, severe   Numbness of left foot 10/21/2019   Pacemaker 04/10/2012   Medtronic pacemaker  Last Assessment & Plan:  Status post pacemaker implantation for symptomatic bradycardia.  Pacemaker site is slightly tender and erythematous.  A prescription of Keflex was dispensed.  I also asked the patient to closely followup with the EP service regarding his pacemaker placement. 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  This was switched to by mouth amiodarone at the time of the thoracic surgery appointment was discontinued.   Presence of aortocoronary bypass graft 10/24/2011   Overview:  Performed at [facility] 2013 Last Assessment & Plan:  No complication post coronary bypass grafting.   Prostate cancer ([place])    a. 2001 s/p TURP.   Pulmonary nodule    a. felt to be noncancerous.  Status post followup CT scan 4 mm and stable.   Renal artery stenosis    a. 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   Trochanteric bursitis of hip, bilateral 01/10/2017   UGIB (upper gastrointestinal bleed) 02/01/2018   EGD  02/06/18 - mod, non-erosive gastritis   Venous insufficiency of leg 06/05/2010   Vitamin B12 deficiency 08/23/2009   Jan '14  July '14 B12 level  >1500    472   Weakness of both lower extremities 04/09/2020   Past Surgical History:  Procedure Laterality Date   ANTERIOR CHAMBER WASHOUT Left 10/04/2018   Procedure: Anterior Chamber Washout, Vitreous Tap;  Surgeon: Jalene Mullet, MD;  Location: [place];  Service: Ophthalmology;  Laterality: Left;   cardia catherization  07-07-99   CARDIAC SURGERY  10/18/12   open heart surgery   CATARACT EXTRACTION W/ INTRAOCULAR LENS  IMPLANT, BILATERAL  3/205, 06/2013   m[facility]uen   COLONOSCOPY  04/12/07   CORONARY ARTERY BYPASS GRAFT  10/19/2011   Procedure: CORONARY ARTERY BYPASS GRAFTING (CABG);  Surgeon: Gaye Pollack, MD;  Location: [place];  Service: Open Heart Surgery;  Laterality: N/A;  times three using Left Internal Mammary Artery and Right  Greater [place] Vein Graft harvested Endoscopically   edg  07-17-1994   FLEXIBLE SIGMOIDOSCOPY  11-03-1997   GAS INSERTION Left 10/04/2018   Procedure: Insertion Of Gas;  Surgeon: Jalene Mullet, MD;  Location: [place];  Service: Ophthalmology;  Laterality: Left;   GAS/FLUID EXCHANGE Left 10/04/2018   Procedure: Gas/Fluid Exchange;  Surgeon: Jalene Mullet, MD;  Location: [place];  Service: Ophthalmology;  Laterality: Left;   LEFT HEART CATHETERIZATION WITH CORONARY ANGIOGRAM N/A 10/16/2011   Procedure: LEFT HEART CATHETERIZATION WITH CORONARY ANGIOGRAM;  Surgeon: Peter M Martinique, MD;  Location: [facility] CATH LAB;  Service: Cardiovascular;  Laterality: N/A;   LEFT HEART CATHETERIZATION WITH CORONARY/GRAFT ANGIOGRAM N/A 03/11/2013   Procedure: LEFT HEART CATHETERIZATION WITH [name];  Surgeon: Blane Ohara, MD;  Location: [facility] CATH LAB;  Service: Cardiovascular;  Laterality: N/A;   lumbar spinal disk and neck fusion surgery     PACEMAKER INSERTION  03/28/12   PPM implanted for mobitz II AV block   PARS PLANA VITRECTOMY Left 10/04/2018   Procedure:  PARS PLANA VITRECTOMY 25 GAUGE FOR ENDOPHTHALMITIS WITH INJECTION OF INTRAVITREAL ANTIBIOTIC;  Surgeon: Jalene Mullet, MD;  Location: [place];  Service: Ophthalmology;  Laterality: Left;   peripheral vascular catherization  11-24-03   PERMANENT PACEMAKER INSERTION N/A 03/28/2012   Procedure: PERMANENT PACEMAKER INSERTION;  Surgeon: Thompson Grayer, MD;  [facility] C[facility] LAB;  Service: Cardiovascular;  Laterality: N/A;   PROSTATECTOMY     renal circulation  10-01-03   s/p ptca     stents     X 2   stress cardiolite  05-04-05   spring 09-negative except for apical thinning, EF 68%   Patient Active Problem List   Diagnosis Date Noted   Gait abnormality 09/21/2021   History of cervical discectomy 09/21/2021   Polyarthralgia    Chronic gouty arthritis    Degeneration of lumbar intervertebral disc    Bilateral plantar fasciitis 10/25/2020    Weakness of both lower extremities 04/09/2020   S/P inguinal hernia repair 08/13/2019   Left inguinal hernia 07/01/2019   Gout 05/12/2018   Chronic diastolic CHF (congestive heart failure) 02/05/2018   UGIB (upper gastrointestinal bleed) 02/01/2018   Lumbar post-laminectomy syndrome 12/12/2017   Hyperpigmentation 11/04/2017   Spinal stenosis of lumbar region 10/09/2017   Frequent urination 09/03/2017   Gouty arthritis of right great toe 09/03/2017   Moderate aortic regurgitation 63/84/6659   Diastolic dysfunction 93/57/0177   Edema 06/29/2017   Trochanteric bursitis of hip, bilateral 01/10/2017   Internal hemorrhoids 08/15/2016   Cephalalgia 07/14/2015   Constipation 03/01/2015   CHB (complete heart block) 05/03/2012   Postoperative atrial fibrillation 05/03/2012   Pacemaker 04/10/2012   AV block, 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 03/26/2012   Coronary atherosclerosis 11/27/2011   Presence of aortocoronary bypass graft 10/24/2011   Pulmonary nodule    CAD (coronary artery disease)    CKD (chronic kidney disease) stage 3, GFR 30-59 ml/min    Venous insufficiency of leg 06/05/2010   Insomnia 11/09/2009   Pain in joint, shoulder region 10/14/2009   Vitamin B12 deficiency 08/23/2009   Peripheral neuropathy 03/21/2009   Degenerative disc disease, cervical, with radiculopathy 09/21/2008   Vertigo 08/29/2007   Dyslipidemia 01/17/2007   Essential hypertension 07/20/2006   Prostate cancer 07/20/2006    ONSET DATE: BLE issues for years,   REFERRING DIAG: R26.9 (ICD-10-CM) - Gait abnormality Z98.890 (ICD-10-CM) - History of cervical discectomy   THERAPY DIAG:  No diagnosis found.  Rationale for Evaluation and Treatment Rehabilitation  SUBJECTIVE:  SUBJECTIVE STATEMENT: "Did not sleep last  night and had a L shot in the eye, so I may not perform as well today. Did the exercises that kelly gave me and that pooped me out." But denies dizziness with exercises. Last time he had dizziness was when getting out of bed over the weekend.  Pt accompanied by: significant other  PERTINENT HISTORY: see PMH  PAIN:  Are you having pain? Yes: NPRS scale: 4-5/10 Pain location: L eye Pain description: "eyelid is dragging" Aggravating factors: eye injection Relieving factors: n/a  PRECAUTIONS: Fall  PATIENT GOALS improve balance, eliminate dizziness  OBJECTIVE:     TODAY'S TREATMENT: 10/09/21 Activity Comments                        TODAY'S TREATMENT: 10/04/21 Activity Comments  L HIT Covert positive  R HIT Negative   L brandt daroff/sidelying test Very mild dizziness upon sitting up; no nystagmus  R brandt daroff/sidelying test Very mild dizziness upon sitting up; no nystagmus   L roll test negative  R roll test  Negative   Sitting VOR horizontal 30" No dizziness   Sitting VOR vertical 30" No dizziness      HOME EXERCISE PROGRAM Last updated: 10/04/21 Access Code: [email or web address] URL: [email or web address] Date: 10/04/2021 Prepared by: [facility]  Exercises - Corner Balance Feet Together With Eyes Open  - 1 x daily - 3 x weekly - 3 sets - 15-30 sec hold - Corner Balance Feet Together With Eyes Closed  - 1 x daily - 3 x weekly - 3 sets - 15-30 sec hold - Corner Balance Feet Together: Eyes Open With Head Turns  - 1 x daily - 3 x weekly - 3 sets - 5 repetitions hold - Corner Balance Feet Together: Eyes Closed With Head Turns  - 1 x daily - 3 x weekly - 3 sets - 5 reps - Semi-Tandem Corner Balance With Eyes Open  - 1 x daily - 3 x weekly - 3 sets - 15-30 sec hold - Semi-Tandem Corner Balance With Eyes Closed  - 1 x daily - 3 x weekly - 3 sets - 15-30 sec hold - Brandt-Daroff Vestibular Exercise  - 1 x daily - 5 x weekly -  2 sets - 3-5 reps   PATIENT EDUCATION: Education details: discussed patient's concern about SOB with exertion- advised to discuss this with PCP and cardiology; discussed concerns about problems with word finding and suggested ST; HEP update and adjustment to frequency to avoid fatigue  Person educated: Patient Education method: Explanation, Demonstration, Tactile cues, Verbal cues, and Handouts Education comprehension: verbalized understanding and returned demonstration    Below measures were taken at time of initial evaluation unless otherwise specified:  Vestibular assessment:  (has macular degeneration, wears trifocals)  Oculomotor:  Convergence 8 inches Saccades WNL Smooth pursuits WNL VOR: NT VOR Cancellation: NT Head Impulse Test: negative, but difficulty in performing due to restricted neck movements and pt guarding against passive movement  Roll Test left/right: negative Dix-Hallpike left/right: negative--very limited neck extension Sidelying test left/right: negative--very limited neck extension   DIAGNOSTIC FINDINGS: IMPRESSION: cervical CT 1. No evidence of acute fracture or traumatic malalignment. 2. Similar severe multilevel degenerative change.  COGNITION: Overall cognitive status: Within functional limits for tasks assessed   SENSATION: WFL  COORDINATION: WNL  EDEMA:    MUSCLE TONE: DNT   DTRs:  DNT  POSTURE: rounded shoulders and forward head  LOWER EXTREMITY ROM:     WFL  LOWER EXTREMITY MMT:    BLE gross strength assessed in sitting and demo 5/5  BED MOBILITY:  Modified indepdent  TRANSFERS: Assistive device utilized: None  Sit to stand: Complete Independence Stand to sit: Complete Independence Chair to chair: Modified independence Floor:  DNT    STAIRS:  Level of Assistance:  DNT    GAIT: Gait pattern: step through pattern Distance walked:  Assistive device utilized: Single point cane Level of assistance:  SBA Comments: unsteady in turns  FUNCTIONAL TESTs:  5 times sit to stand: 15.25 sec  M-CTSIB  Condition 1: Firm Surface, EO 30 Sec, Normal Sway  Condition 2: Firm Surface, EC 30 Sec, Normal and Mild Sway  Condition 3: Foam Surface, EO 30 Sec, Mild Sway  Condition 4: Foam Surface, EC 30 Sec, Mild/Moderate Sway   Berg Balance Test: 41/56 Dynamic Gait Index: NT    PATIENT EDUCATION: Education details: continued assessment Person educated: Patient and Spouse Education method: Explanation Education comprehension: verbalized understanding      GOALS: Goals reviewed with patient? Yes  SHORT TERM GOALS: Target date: 10/18/2021  The patient will be independent with HEP for gaze adaptation, habituation, balance, and general mobility. Baseline: Goal status: IN PROGRESS  LONG TERM GOALS: Target date: 11/08/2021  Demonstrate reduced risk for falls per Merrilee Jansky Balance Test score 50/56 Baseline: 41/56 Goal status: IN PROGRESS  2.  Demonstrate low risk for falls per Dynamic Gait Index > 19/24 Baseline: NT Goal status: IN PROGRESS  ASSESSMENT:  CLINICAL IMPRESSION: Patient arrived to session with report of not sleeping well last night and getting a L eye injection today. Also reports fatigue with his HEP. HIT on L was covertly positive, however positional testing was negative. Patient did note some mild dizziness upon sitting up from [place]- suspect motion sensitivity. Updated HEP to address this at home. Patient reported understanding of all edu provided and without complaints at end of session.     OBJECTIVE IMPAIRMENTS Abnormal gait, decreased activity tolerance, decreased balance, difficulty walking, decreased ROM, dizziness, hypomobility, and postural dysfunction.   ACTIVITY LIMITATIONS carrying, lifting, bending, standing, sleeping, transfers, bed mobility, and locomotion level  PARTICIPATION LIMITATIONS: meal prep, cleaning, shopping, community activity, and yard  work  PERSONAL FACTORS Age and Time since onset of injury/illness/exacerbation are also affecting patient's functional outcome.   REHAB POTENTIAL: Good  CLINICAL DECISION MAKING: Evolving/moderate complexity  EVALUATION COMPLEXITY: Moderate  PLAN: PT FREQUENCY: 1-2x/week  PT DURATION: 6 weeks  PLANNED INTERVENTIONS: Therapeutic exercises, Therapeutic activity, Neuromuscular re-education, Balance training, Gait training, Patient/Family education, Self Care, Joint mobilization, Stair training, Vestibular training, Canalith repositioning, Aquatic Therapy, Dry Needling, Spinal mobilization, Cryotherapy, Moist heat, Traction, and Manual therapy  PLAN FOR NEXT SESSION: VOR testing , progress  balance HEP  Janene Harvey, PT, DPT 10/06/21 11:49 AM  [facility] at [facility] [address], [place], [place] [address] Phone # [phone] Fax # [phone]

## 2021-10-09 ENCOUNTER — Encounter: Payer: Self-pay | Admitting: Physical Therapy

## 2021-10-09 ENCOUNTER — Ambulatory Visit: Payer: Medicare Other | Attending: Neurology | Admitting: Physical Therapy

## 2021-10-09 DIAGNOSIS — R262 Difficulty in walking, not elsewhere classified: Secondary | ICD-10-CM | POA: Insufficient documentation

## 2021-10-09 DIAGNOSIS — R2689 Other abnormalities of gait and mobility: Secondary | ICD-10-CM | POA: Insufficient documentation

## 2021-10-09 DIAGNOSIS — R2681 Unsteadiness on feet: Secondary | ICD-10-CM | POA: Insufficient documentation

## 2021-10-10 ENCOUNTER — Ambulatory Visit (INDEPENDENT_AMBULATORY_CARE_PROVIDER_SITE_OTHER): Payer: Medicare Other

## 2021-10-10 DIAGNOSIS — I442 Atrioventricular block, complete: Secondary | ICD-10-CM | POA: Diagnosis not present

## 2021-10-10 LAB — CUP PACEART REMOTE DEVICE CHECK
Battery Impedance: 1285 Ohm
Battery Remaining Longevity: 54 mo
Battery Voltage: 2.77 V
Brady Statistic AP VP Percent: 1 %
Brady Statistic AP VS Percent: 50 %
Brady Statistic AS VP Percent: 0 %
Brady Statistic AS VS Percent: 48 %
Date Time Interrogation Session: 20231003103802
Implantable Lead Implant Date: 20140321
Implantable Lead Implant Date: 20140321
Implantable Lead Location: 753859
Implantable Lead Location: 753860
Implantable Lead Model: 5076
Implantable Lead Model: 5092
Implantable Pulse Generator Implant Date: 20140321
Lead Channel Impedance Value: 396 Ohm
Lead Channel Impedance Value: 732 Ohm
Lead Channel Pacing Threshold Amplitude: 0.5 V
Lead Channel Pacing Threshold Amplitude: 0.875 V
Lead Channel Pacing Threshold Pulse Width: 0.4 ms
Lead Channel Pacing Threshold Pulse Width: 0.4 ms
Lead Channel Setting Pacing Amplitude: 2 V
Lead Channel Setting Pacing Amplitude: 2.5 V
Lead Channel Setting Pacing Pulse Width: 0.4 ms
Lead Channel Setting Sensing Sensitivity: 5.6 mV

## 2021-10-11 ENCOUNTER — Encounter: Payer: Self-pay | Admitting: Physical Therapy

## 2021-10-11 ENCOUNTER — Ambulatory Visit: Payer: Medicare Other | Admitting: Physical Therapy

## 2021-10-11 DIAGNOSIS — R262 Difficulty in walking, not elsewhere classified: Secondary | ICD-10-CM

## 2021-10-11 DIAGNOSIS — R2681 Unsteadiness on feet: Secondary | ICD-10-CM | POA: Diagnosis not present

## 2021-10-11 DIAGNOSIS — R2689 Other abnormalities of gait and mobility: Secondary | ICD-10-CM

## 2021-10-11 NOTE — Therapy (Signed)
OUTPATIENT PHYSICAL THERAPY NEURO TREATMENT   Patient Name: [name] MRN: [id number] DOB:04/06/1926, 88 y.o., male Today's Date: 10/11/2021   PCP: [name], MD REFERRING PROVIDER: Marcial Pacas, MD    PT End of Session - 10/11/21 1615     Visit Number 5    Number of Visits 12    Date for PT Re-Evaluation 11/08/21    Authorization Type Medicare/Tricare    PT Start Time 1617    PT Stop Time 1658    PT Time Calculation (min) 41 min    Equipment Utilized During Treatment Gait belt    Activity Tolerance Patient tolerated treatment well    Behavior During Therapy WFL for tasks assessed/performed                Past Medical History:  Diagnosis Date   Acute superficial venous thrombosis of lower extremity    a. RLE after CABG, neg dopp for DVT.   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 S/P stenting to mid RCA, prox PDA 06/1999. b. NSTEMI/CABG x 3 in 10/2011 with LIMA to LAD, SVG to PDA, and SVG to OM1.    Cephalalgia 07/14/2015   CHB (complete heart block) 05/03/2012   Overview:  Status post complete heart block heart rate 28 bpm, alternating with 2 to one AV block and drug for bradycardia.  Status post pacemaker implantation.status post Medtronic pacemaker implantation the 03/28/2012   Chronic diastolic CHF (congestive heart failure) 02/05/2018   Chronic gouty arthritis    Chronic UTI    a. Followed by Dr. Risa Grill - colonized/asymptomatic - not on abx   CKD (chronic kidney disease)    stage 3, GFR 30-59 ml/min; stable with a creatinine around 1.9-2.0 followed by nephrology.   Constipation 03/01/2015   Symptoms and exam consistent with constipation. Abdominal exam is benign with no evidence of pain or obstruction. Discussed importance of increasing fiber and water intake coupled with physical activity to assist with bowel movements. Continue  over-the-counter medication management as needed. Follow-up if symptoms worsen or fail to improve.   Coronary atherosclerosis 11/27/2011   Overview:  Multivessel coronary artery disease recently diagnosed by catheterization 2013 History of stent placement with a heparin-coated stent 2001  Last Assessment & Plan:  Status post coronary bypass grafting.  No recurrent chest pain. Overview:  Overview:  S/P stenting to mid RCA, prox PDA 06/1999;  06/2007 Myoview: negative except for apical thinning, EF 68%, last Myoview August 10, 2008 with n   Degeneration of lumbar intervertebral disc    Degenerative disc disease, cervical, with radiculopathy 78/29/5621   Diastolic dysfunction 30/86/5784   Grade 1 DD on Echo 06/2017   Dyslipidemia    Dysphagia 02/06/2016   3/18 - DG Esophagus:  1. Mild esophageal dysmotility, likely presbyesophagus. 2. No other explanation for patient's symptoms. 3. Small hiatal hernia.  On swallow eval - evidence of cervical spine disease and that was likely contributing   Echocardiogram    Echocardiogram 08/2018: EF 50-55, inf-lat AK, mild LVH, Gr 1 DD, RVSP 47, mild LAE, mild to mod MR, mild AI, mild AS (mean 11)   Epistaxis 12/08/2020   Essential hypertension 07/20/2006   Fatigue 05/18/2017   GERD (gastroesophageal reflux disease)    Gout 05/12/2018   Gouty arthritis of right great toe 09/03/2017   Left toe Injected January 31, 2018    Hyperpigmentation 11/04/2017   Internal hemorrhoids 08/15/2016   Left inguinal hernia 07/01/2019   Lumbar post-laminectomy syndrome 12/12/2017   Moderate aortic regurgitation 07/04/2017   Echo 06/2017:  EF 55-60%, mild LVH, grade 1 DD, mild AS, mod AR, mild MR, mild-mod TR   Myocardial infarction    Neck injury    a. C3-C4 and C4-C5 foraminal narrowing, severe   Numbness of left foot 10/21/2019   Pacemaker 04/10/2012   Medtronic pacemaker  Last Assessment & Plan:  Status post pacemaker implantation for symptomatic bradycardia.  Pacemaker site  is slightly tender and erythematous.  A prescription of Keflex was dispensed.  I also asked the patient to closely followup with the EP service regarding his pacemaker placement. 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  This was switched to by mouth amiodarone at the time of the thoracic surgery appointment was discontinued.   Presence of aortocoronary bypass graft 10/24/2011   Overview:  Performed at [facility] 2013 Last Assessment & Plan:  No complication post coronary bypass grafting.   Prostate cancer ([place])    a. 2001 s/p TURP.   Pulmonary nodule    a. felt to be noncancerous.  Status post followup CT scan 4 mm and stable.   Renal artery stenosis    a. 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   Trochanteric bursitis of hip, bilateral 01/10/2017   UGIB (upper gastrointestinal bleed) 02/01/2018   EGD  02/06/18 - mod, non-erosive gastritis   Venous insufficiency of leg 06/05/2010   Vitamin B12 deficiency 08/23/2009   Jan '14  July '14 B12 level  >1500    472   Weakness of both lower extremities 04/09/2020   Past Surgical History:  Procedure Laterality Date   ANTERIOR CHAMBER WASHOUT Left 10/04/2018   Procedure: Anterior Chamber Washout, Vitreous Tap;  Surgeon: Jalene Mullet, MD;  Location: [place];  Service: Ophthalmology;  Laterality: Left;   cardia  catherization  07-07-99   CARDIAC SURGERY  10/18/12   open heart surgery   CATARACT EXTRACTION W/ INTRAOCULAR LENS  IMPLANT, BILATERAL  3/205, 06/2013   [facility]   COLONOSCOPY  04/12/07   CORONARY ARTERY BYPASS GRAFT  10/19/2011   Procedure: CORONARY ARTERY BYPASS GRAFTING (CABG);  Surgeon: Gaye Pollack, MD;  Location: [place];  Service: Open Heart Surgery;  Laterality: N/A;  times three using Left Internal Mammary Artery and Right Greater Saphenouse Vein Graft harvested Endoscopically   edg  07-17-1994   FLEXIBLE SIGMOIDOSCOPY  11-03-1997   GAS INSERTION Left 10/04/2018   Procedure: Insertion Of Gas;  Surgeon: Jalene Mullet, MD;  Location: [place];  Service: Ophthalmology;  Laterality: Left;   GAS/FLUID EXCHANGE Left 10/04/2018   Procedure: Gas/Fluid Exchange;  Surgeon: Jalene Mullet, MD;  Location: [place];  Service: Ophthalmology;  Laterality: Left;   LEFT HEART CATHETERIZATION WITH CORONARY ANGIOGRAM N/A 10/16/2011  Procedure: LEFT HEART CATHETERIZATION WITH CORONARY ANGIOGRAM;  Surgeon: Peter M Martinique, MD;  Location: [facility] CATH LAB;  Service: Cardiovascular;  Laterality: N/A;   LEFT HEART CATHETERIZATION WITH CORONARY/GRAFT ANGIOGRAM N/A 03/11/2013   Procedure: LEFT HEART CATHETERIZATION WITH [name];  Surgeon: Blane Ohara, MD;  Location[facility];  Service: Cardiovascular;  Laterality: N/A;   lumbar spinal disk and neck fusion surgery     PACEMAKER INSERTION  03/28/12   PPM implanted for mobitz II AV block   PARS PLANA VITRECTOMY Left 10/04/2018   Procedure: PARS PLANA VITRECTOMY 25 GAUGE FOR ENDOPHTHALMITIS WITH INJECTION OF INTRAVITREAL ANTIBIOTIC;  Surgeon: Jalene Mullet, MD;  Location: [place];  Service: Ophthalmology;  Laterality: Left;   peripheral vascular catherization  11-24-03   PERMANENT PACEMAKER INSERTION N/A 03/28/2012   Procedure: PERMANENT PACEMAKER INSERTION;  Surgeon: Thompson Grayer, MD;  Location: [facility] CATH LAB;  Service: Cardiovascular;  Laterality: N/A;    PROSTATECTOMY     renal circulation  10-01-03   s/p ptca     stents     X 2   stress cardiolite  05-04-05   spring 09-negative except for apical thinning, EF 68%   Patient Active Problem List   Diagnosis Date Noted   Gait abnormality 09/21/2021   History of cervical discectomy 09/21/2021   Polyarthralgia    Chronic gouty arthritis    Degeneration of lumbar intervertebral disc    Bilateral plantar fasciitis 10/25/2020   Weakness of both lower extremities 04/09/2020   S/P inguinal hernia repair 08/13/2019   Left inguinal hernia 07/01/2019   Gout 05/12/2018   Chronic diastolic CHF (congestive heart failure) 02/05/2018   UGIB (upper gastrointestinal bleed) 02/01/2018   Lumbar post-laminectomy syndrome 12/12/2017   Hyperpigmentation 11/04/2017   Spinal stenosis of lumbar region 10/09/2017   Frequent urination 09/03/2017   Gouty arthritis of right great toe 09/03/2017   Moderate aortic regurgitation 13/08/6576   Diastolic dysfunction 46/96/2952   Edema 06/29/2017   Trochanteric bursitis of hip, bilateral 01/10/2017   Internal hemorrhoids 08/15/2016   Cephalalgia 07/14/2015   Constipation 03/01/2015   CHB (complete heart block) 05/03/2012   Postoperative atrial fibrillation 05/03/2012   Pacemaker 04/10/2012   AV block, 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 03/26/2012   Coronary atherosclerosis 11/27/2011   Presence of aortocoronary bypass graft 10/24/2011   Pulmonary nodule    CAD (coronary artery disease)    CKD (chronic kidney disease) stage 3, GFR 30-59 ml/min    Venous insufficiency of leg 06/05/2010   Insomnia 11/09/2009   Pain in joint, shoulder region 10/14/2009   Vitamin B12 deficiency 08/23/2009   Peripheral neuropathy 03/21/2009   Degenerative disc disease, cervical, with radiculopathy 09/21/2008   Vertigo 08/29/2007   Dyslipidemia 01/17/2007   Essential hypertension 07/20/2006   Prostate cancer 07/20/2006    ONSET DATE: BLE issues for years,   REFERRING DIAG:  R26.9 (ICD-10-CM) - Gait abnormality Z98.890 (ICD-10-CM) - History of cervical discectomy   THERAPY DIAG:  Unsteadiness on feet  Other abnormalities of gait and mobility  Difficulty in walking, not elsewhere classified  Rationale for Evaluation and Treatment Rehabilitation  SUBJECTIVE:  SUBJECTIVE STATEMENT: Did the new exercises and that may be making my legs tight.  Still not sleeping very well.   No more dizziness. Pt accompanied by: significant other  PERTINENT HISTORY: see PMH  PAIN:  Are you having pain? No  PRECAUTIONS: Fall  PATIENT GOALS improve balance, eliminate dizziness  OBJECTIVE:     TODAY'S TREATMENT: 10/11/2021 Activity Comments  Reviewed additions to HEP last visit Pt return demo understanding  Wide BOS lateral weightshifting x 10, stagger stance rocking up and back x 10 reps Cues for technique  Side step and weight shift x 10 reps Cues for foot clearance  Practiced sidestep turning technique in parallel bars Pt reports he is already doing this at home for turns  Marching in place x 10 reps>demo marching turns for tight spaces   Romberg stance EO/EC head motions    Tandem stance EO and EC, 3 reps 15 sec Intermittent to light UE support  Forward step over obstacle, x 10 reps each leg leading; side step over obstacle, R and L x 10 reps Light UE support  Figure-8 turns, 4 reps around obstacles, no cane Cues for wider BOS and wider U-type turn, with improved unsteadiness noted           HOME EXERCISE PROGRAM Last updated: 10/09/21 Access Code: [facility] URL: [email or web address] Date: 10/09/2021 Prepared by: [facility]  Exercises - Corner Balance Feet Together With Eyes Open  - 1 x daily - 3 x weekly - 3 sets -  15-30 sec hold - Corner Balance Feet Together With Eyes Closed  - 1 x daily - 3 x weekly - 3 sets - 15-30 sec hold - Corner Balance Feet Together: Eyes Open With Head Turns  - 1 x daily - 3 x weekly - 3 sets - 5 repetitions hold - Corner Balance Feet Together: Eyes Closed With Head Turns  - 1 x daily - 3 x weekly - 3 sets - 5 reps - Semi-Tandem Corner Balance With Eyes Open  - 1 x daily - 3 x weekly - 3 sets - 15-30 sec hold - Semi-Tandem Corner Balance With Eyes Closed  - 1 x daily - 3 x weekly - 3 sets - 15-30 sec hold - Brandt-Daroff Vestibular Exercise  - 1 x daily - 5 x weekly - 2 sets - 3-5 reps - Standing Toe Taps  - 1 x daily - 5 x weekly - 2 sets - 10 reps - Backward Walking with Counter Support  - 1 x daily - 5 x weekly - 2 sets - 10 reps      Below measures were taken at time of initial evaluation unless otherwise specified:  Vestibular assessment:  (has macular degeneration, wears trifocals)  Oculomotor:  Convergence 8 inches Saccades WNL Smooth pursuits WNL VOR: NT VOR Cancellation: NT Head Impulse Test: negative, but difficulty in performing due to restricted neck movements and pt guarding against passive movement  Roll Test left/right: negative Dix-Hallpike left/right: negative--very limited neck extension Sidelying test left/right: negative--very limited neck extension   DIAGNOSTIC FINDINGS: IMPRESSION: cervical CT 1. No evidence of acute fracture or traumatic malalignment. 2. Similar severe multilevel degenerative change.  COGNITION: Overall cognitive status: Within functional limits for tasks assessed   SENSATION: WFL  COORDINATION: WNL  EDEMA:    MUSCLE TONE: DNT   DTRs:  DNT  POSTURE: rounded shoulders and forward head  LOWER EXTREMITY ROM:     WFL  LOWER EXTREMITY MMT:  BLE gross strength assessed in sitting and demo 5/5  BED MOBILITY:  Modified indepdent  TRANSFERS: Assistive device utilized: None  Sit to stand: Complete  Independence Stand to sit: Complete Independence Chair to chair: Modified independence Floor:  DNT    STAIRS:  Level of Assistance:  DNT    GAIT: Gait pattern: step through pattern Distance walked:  Assistive device utilized: Single point cane Level of assistance: SBA Comments: unsteady in turns  FUNCTIONAL TESTs:  5 times sit to stand: 15.25 sec  M-CTSIB  Condition 1: Firm Surface, EO 30 Sec, Normal Sway  Condition 2: Firm Surface, EC 30 Sec, Normal and Mild Sway  Condition 3: Foam Surface, EO 30 Sec, Mild Sway  Condition 4: Foam Surface, EC 30 Sec, Mild/Moderate Sway   Berg Balance Test: 41/56 Dynamic Gait Index: NT    PATIENT EDUCATION: Education details: continued assessment Person educated: Patient and Spouse Education method: Explanation Education comprehension: verbalized understanding      GOALS: Goals reviewed with patient? Yes  SHORT TERM GOALS: Target date: 10/18/2021  The patient will be independent with HEP for gaze adaptation, habituation, balance, and general mobility. Baseline: Goal status: IN PROGRESS  LONG TERM GOALS: Target date: 11/08/2021  Demonstrate reduced risk for falls per Merrilee Jansky Balance Test score 50/56 Baseline: 41/56 Goal status: IN PROGRESS  2.  Demonstrate low risk for falls per Dynamic Gait Index > 19/24 Baseline: NT Goal status: IN PROGRESS  ASSESSMENT:  CLINICAL IMPRESSION:  Skilled PT session focused on dynamic balance exercises and turning practice today.  He is able to perform at least 50% of the standing exercises in parallel bars without UE support.  He has decreased stability with turns, but that improves with repetition and cues for wider BOS.  He will continue to benefit from skilled PT towards goals for improved balance, decreased fall risk.   OBJECTIVE IMPAIRMENTS Abnormal gait, decreased activity tolerance, decreased balance, difficulty walking, decreased ROM, dizziness, hypomobility, and postural  dysfunction.   ACTIVITY LIMITATIONS carrying, lifting, bending, standing, sleeping, transfers, bed mobility, and locomotion level  PARTICIPATION LIMITATIONS: meal prep, cleaning, shopping, community activity, and yard work  PERSONAL FACTORS Age and Time since onset of injury/illness/exacerbation are also affecting patient's functional outcome.   REHAB POTENTIAL: Good  CLINICAL DECISION MAKING: Evolving/moderate complexity  EVALUATION COMPLEXITY: Moderate  PLAN: PT FREQUENCY: 1-2x/week  PT DURATION: 6 weeks  PLANNED INTERVENTIONS: Therapeutic exercises, Therapeutic activity, Neuromuscular re-education, Balance training, Gait training, Patient/Family education, Self Care, Joint mobilization, Stair training, Vestibular training, Canalith repositioning, Aquatic Therapy, Dry Needling, Spinal mobilization, Cryotherapy, Moist heat, Traction, and Manual therapy  PLAN FOR NEXT SESSION: Work on compliant surfaces, including incline/decline and progress balance HEP as appropriate.   Mady Haagensen, PT 10/11/21 5:14 PM Phone: [phone] Fax: [phone]   [facility] [place], [place], [place] [address] Phone # [phone] Fax # [phone]

## 2021-10-16 ENCOUNTER — Encounter: Payer: Self-pay | Admitting: Physical Therapy

## 2021-10-16 ENCOUNTER — Ambulatory Visit: Payer: Medicare Other | Admitting: Physical Therapy

## 2021-10-16 DIAGNOSIS — R2681 Unsteadiness on feet: Secondary | ICD-10-CM | POA: Diagnosis not present

## 2021-10-16 DIAGNOSIS — R262 Difficulty in walking, not elsewhere classified: Secondary | ICD-10-CM

## 2021-10-16 DIAGNOSIS — R2689 Other abnormalities of gait and mobility: Secondary | ICD-10-CM

## 2021-10-16 NOTE — Therapy (Signed)
OUTPATIENT PHYSICAL THERAPY NEURO TREATMENT   Patient Name: [name] MRN: [id number] DOB:[birth year], 88 y.o., male Today's Date: 10/16/2021   PCP: [name], MD REFERRING PROVIDER: Marcial Pacas, MD    PT End of Session - 10/16/21 1408     Visit Number 6    Number of Visits 12    Date for PT Re-Evaluation 11/08/21    Authorization Type Medicare/Tricare    PT Start Time 1405    PT Stop Time 1443    PT Time Calculation (min) 38 min    Equipment Utilized During Treatment Gait belt    Activity Tolerance Patient tolerated treatment well    Behavior During Therapy WFL for tasks assessed/performed                 Past Medical History:  Diagnosis Date   Acute superficial venous thrombosis of lower extremity    a. RLE after CABG, neg dopp for DVT.   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 S/P stenting to mid RCA, prox PDA 06/1999. b. NSTEMI/CABG x 3 in 10/2011 with LIMA to LAD, SVG to PDA, and SVG to OM1.    Cephalalgia 07/14/2015   CHB (complete heart block) 05/03/2012   Overview:  Status post complete heart block heart rate 28 bpm, alternating with 2 to one AV block and drug for bradycardia.  Status post pacemaker implantation.status post Medtronic pacemaker implantation the 03/28/2012   Chronic diastolic CHF (congestive heart failure) 02/05/2018   Chronic gouty arthritis    Chronic UTI    a. Followed by Dr. Risa Grill - colonized/asymptomatic - not on abx   CKD (chronic kidney disease)    stage 3, GFR 30-59 ml/min; stable with a creatinine around 1.9-2.0 followed by nephrology.   Constipation 03/01/2015   Symptoms and exam consistent with constipation. Abdominal exam is benign with no evidence of pain or obstruction. Discussed importance of increasing fiber and water intake coupled with physical activity to assist with bowel movements. Continue  over-the-counter medication management as needed. Follow-up if symptoms worsen or fail to improve.   Coronary atherosclerosis 11/27/2011   Overview:  Multivessel coronary artery disease recently diagnosed by catheterization 2013 History of stent placement with a heparin-coated stent 2001  Last Assessment & Plan:  Status post coronary bypass grafting.  No recurrent chest pain. Overview:  Overview:  S/P stenting to mid RCA, prox PDA 06/1999;  06/2007 Myoview: negative except for apical thinning, EF 68%, last Myoview August 10, 2008 with n   Degeneration of lumbar intervertebral disc    Degenerative disc disease, cervical, with radiculopathy 09/32/6712   Diastolic dysfunction 45/80/9983   Grade 1 DD on Echo 06/2017   Dyslipidemia    Dysphagia 02/06/2016   3/18 - DG Esophagus:  1. Mild esophageal dysmotility, likely presbyesophagus. 2. No other explanation for patient's symptoms. 3. Small hiatal hernia.  On swallow eval - evidence of cervical spine disease and that was likely contributing   Echocardiogram    Echocardiogram 08/2018: EF 50-55, inf-lat AK, mild LVH, Gr 1 DD, RVSP 47, mild LAE, mild to mod MR, mild AI, mild AS (mean 11)   Epistaxis 12/08/2020   Essential hypertension 07/20/2006   Fatigue 05/18/2017   GERD (gastroesophageal reflux disease)    Gout 05/12/2018   Gouty arthritis of right great toe 09/03/2017   Left toe Injected  January 31, 2018    Hyperpigmentation 11/04/2017   Internal hemorrhoids 08/15/2016   Left inguinal hernia 07/01/2019   Lumbar post-laminectomy syndrome 12/12/2017   Moderate aortic regurgitation 07/04/2017   Echo 06/2017:  EF 55-60%, mild LVH, grade 1 DD, mild AS, mod AR, mild MR, mild-mod TR   Myocardial infarction    Neck injury    a. C3-C4 and C4-C5 foraminal narrowing, severe   Numbness of left foot 10/21/2019   Pacemaker 04/10/2012   Medtronic pacemaker  Last Assessment & Plan:  Status post pacemaker implantation for symptomatic bradycardia.  Pacemaker site  is slightly tender and erythematous.  A prescription of Keflex was dispensed.  I also asked the patient to closely followup with the EP service regarding his pacemaker placement. 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  This was switched to by mouth amiodarone at the time of the thoracic surgery appointment was discontinued.   Presence of aortocoronary bypass graft 10/24/2011   Overview:  Performed at [facility] 2013 Last Assessment & Plan:  No complication post coronary bypass grafting.   Prostate cancer ([place])    a. 2001 s/p TURP.   Pulmonary nodule    a. felt to be noncancerous.  Status post followup CT scan 4 mm and stable.   Renal artery stenosis    a. 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   Trochanteric bursitis of hip, bilateral 01/10/2017   UGIB (upper gastrointestinal bleed) 02/01/2018   EGD  02/06/18 - mod, non-erosive gastritis   Venous insufficiency of leg 06/05/2010   Vitamin B12 deficiency 08/23/2009   Jan '14  July '14 B12 level  >1500    472   Weakness of both lower extremities 04/09/2020   Past Surgical History:  Procedure Laterality Date   ANTERIOR CHAMBER WASHOUT Left 10/04/2018   Procedure: Anterior Chamber Washout, Vitreous Tap;  Surgeon: Jalene Mullet, MD;  Location: [place];  Service: Ophthalmology;  Laterality: Left;   cardia  catherization  07-07-99   CARDIAC SURGERY  10/18/12   open heart surgery   CATARACT EXTRACTION W/ INTRAOCULAR LENS  IMPLANT, BILATERAL  3/205, 06/2013   [facility]   COLONOSCOPY  04/12/07   CORONARY ARTERY BYPASS GRAFT  10/19/2011   Procedure: CORONARY ARTERY BYPASS GRAFTING (CABG);  Surgeon: Gaye Pollack, MD;  Location: [place];  Service: Open Heart Surgery;  Laterality: N/A;  times three using Left Internal Mammary Artery and Right Greater S[place]hen[place] Vein Graft harvested Endoscopically   edg  07-17-1994   FLEXIBLE SIGMOIDOSCOPY  11-03-1997   GAS INSERTION Left 10/04/2018   Procedure: Insertion Of Gas;  Surgeon: Jalene Mullet, MD;  Location: [place];  Service: Ophthalmology;  Laterality: Left;   GAS/FLUID EXCHANGE Left 10/04/2018   Procedure: Gas/Fluid Exchange;  Surgeon: Jalene Mullet, MD;  Location: [place];  Service: Ophthalmology;  Laterality: Left;   LEFT HEART CATHETERIZATION WITH CORONARY ANGIOGRAM N/A  10/16/2011   Procedure: LEFT HEART CATHETERIZATION WITH CORONARY ANGIOGRAM;  Surgeon: Peter M Martinique, MD;  Location[facility] C[facility] LAB;  Service: Cardiovascular;  Laterality: N/A;   LEFT HEART CATHETERIZATION WITH CORONARY/GRAFT ANGIOGRAM N/A 03/11/2013   Procedure: LEFT HEART CATHETERIZATION WITH [name];  Surgeon: Blane Ohara, MD;  Location: [facility] CATH LAB;  Service: Cardiovascular;  Laterality: N/A;   lumbar spinal disk and neck fusion surgery     PACEMAKER INSERTION  03/28/12   PPM implanted for mobitz II AV block   PARS PLANA VITRECTOMY Left 10/04/2018   Procedure: PARS PLANA VITRECTOMY 25 GAUGE FOR ENDOPHTHALMITIS WITH INJECTION OF INTRAVITREAL ANTIBIOTIC;  Surgeon: Jalene Mullet, MD;  Location: [place];  Service: Ophthalmology;  Laterality: Left;   peripheral vascular catherization  11-24-03   PERMANENT PACEMAKER INSERTION N/A 03/28/2012   Procedure: PERMANENT PACEMAKER INSERTION;  Surgeon: Thompson Grayer, MD;  Location: [facility] CATH LAB;  Service: Cardiovascular;  Laterality: N/A;    PROSTATECTOMY     renal circulation  10-01-03   s/p ptca     stents     X 2   stress cardiolite  05-04-05   spring 09-negative except for apical thinning, EF 68%   Patient Active Problem List   Diagnosis Date Noted   Gait abnormality 09/21/2021   History of cervical discectomy 09/21/2021   Polyarthralgia    Chronic gouty arthritis    Degeneration of lumbar intervertebral disc    Bilateral plantar fasciitis 10/25/2020   Weakness of both lower extremities 04/09/2020   S/P inguinal hernia repair 08/13/2019   Left inguinal hernia 07/01/2019   Gout 05/12/2018   Chronic diastolic CHF (congestive heart failure) 02/05/2018   UGIB (upper gastrointestinal bleed) 02/01/2018   Lumbar post-laminectomy syndrome 12/12/2017   Hyperpigmentation 11/04/2017   Spinal stenosis of lumbar region 10/09/2017   Frequent urination 09/03/2017   Gouty arthritis of right great toe 09/03/2017   Moderate aortic regurgitation 83/41/9622   Diastolic dysfunction 29/79/8921   Edema 06/29/2017   Trochanteric bursitis of hip, bilateral 01/10/2017   Internal hemorrhoids 08/15/2016   Cephalalgia 07/14/2015   Constipation 03/01/2015   CHB (complete heart block) 05/03/2012   Postoperative atrial fibrillation 05/03/2012   Pacemaker 04/10/2012   AV block, 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 03/26/2012   Coronary atherosclerosis 11/27/2011   Presence of aortocoronary bypass graft 10/24/2011   Pulmonary nodule    CAD (coronary artery disease)    CKD (chronic kidney disease) stage 3, GFR 30-59 ml/min    Venous insufficiency of leg 06/05/2010   Insomnia 11/09/2009   Pain in joint, shoulder region 10/14/2009   Vitamin B12 deficiency 08/23/2009   Peripheral neuropathy 03/21/2009   Degenerative disc disease, cervical, with radiculopathy 09/21/2008   Vertigo 08/29/2007   Dyslipidemia 01/17/2007   Essential hypertension 07/20/2006   Prostate cancer 07/20/2006    ONSET DATE: BLE issues for years,   REFERRING DIAG:  R26.9 (ICD-10-CM) - Gait abnormality Z98.890 (ICD-10-CM) - History of cervical discectomy   THERAPY DIAG:  Unsteadiness on feet  Other abnormalities of gait and mobility  Difficulty in walking, not elsewhere classified  Rationale for Evaluation and Treatment Rehabilitation  SUBJECTIVE:  SUBJECTIVE STATEMENT: Had an episode yesterday where I tried to get up from the bed and had intense dizziness.  Finally went away about 4:30 yesterday afternoon.  I do sleep some in the recliner and sleep better there. Pt accompanied by: significant other  PERTINENT HISTORY: see PMH  PAIN:  Are you having pain? No  PRECAUTIONS: Fall  PATIENT GOALS improve balance, eliminate dizziness  OBJECTIVE:    TODAY'S TREATMENT: 10/16/2021 Activity Comments  Reviewed/performed Brandt-Daroff exercises, x 1 rep each side Mild feeling upon return up to sitting from left side  R Dix-Hallpike Negative symptoms, no nystagmus  L Dix-Hallpike Negative symptoms, no nystagmus  Sidestepping along parallel bars, 5 reps , no UE support   Forward step over obstacle, 10 reps each leg leading Light BUE support  Side step over obstacle, 10 reps each leg leading Light BUE support  Seated hamstring stretch, 3 x 30 sec Foot propped on 6" block  Standing on Airex:  heel/toe raises x 10, lateral weightshifting x 10 reps; EC head steady 3 x 15 sec  Min guard assist   PATIENT EDUCATION: Education details: Continue to perform Brandt-Daroff as prescribed; try to do this from sofa (as his bed is very high Person educated: Patient Education method: Explanation and Demonstration Education comprehension: verbalized understanding and returned demonstration       HOME EXERCISE PROGRAM Last updated: 10/09/21 Access Code: [facility] URL:  [email or web address] Date: 10/09/2021 Prepared by: [facility]  Exercises - Corner Balance Feet Together With Eyes Open  - 1 x daily - 3 x weekly - 3 sets - 15-30 sec hold - Corner Balance Feet Together With Eyes Closed  - 1 x daily - 3 x weekly - 3 sets - 15-30 sec hold - Corner Balance Feet Together: Eyes Open With Head Turns  - 1 x daily - 3 x weekly - 3 sets - 5 repetitions hold - Corner Balance Feet Together: Eyes Closed With Head Turns  - 1 x daily - 3 x weekly - 3 sets - 5 reps - Semi-Tandem Corner Balance With Eyes Open  - 1 x daily - 3 x weekly - 3 sets - 15-30 sec hold - Semi-Tandem Corner Balance With Eyes Closed  - 1 x daily - 3 x weekly - 3 sets - 15-30 sec hold - Brandt-Daroff Vestibular Exercise  - 1 x daily - 5 x weekly - 2 sets - 3-5 reps - Standing Toe Taps  - 1 x daily - 5 x weekly - 2 sets - 10 reps - Backward Walking with Counter Support  - 1 x daily - 5 x weekly - 2 sets - 10 reps      Below measures were taken at time of initial evaluation unless otherwise specified:  Vestibular assessment:  (has macular degeneration, wears trifocals)  Oculomotor:  Convergence 8 inches Saccades WNL Smooth pursuits WNL VOR: NT VOR Cancellation: NT Head Impulse Test: negative, but difficulty in performing due to restricted neck movements and pt guarding against passive movement  Roll Test left/right: negative Dix-Hallpike left/right: negative--very limited neck extension Sidelying test left/right: negative--very limited neck extension   DIAGNOSTIC FINDINGS: IMPRESSION: cervical CT 1. No evidence of acute fracture or traumatic malalignment. 2. Similar severe multilevel degenerative change.  COGNITION: Overall cognitive status: Within functional limits for tasks assessed   SENSATION: WFL  COORDINATION: WNL  EDEMA:    MUSCLE TONE: DNT   DTRs:  DNT  POSTURE: rounded shoulders and forward  head  LOWER  EXTREMITY ROM:     WFL  LOWER EXTREMITY MMT:    BLE gross strength assessed in sitting and demo 5/5  BED MOBILITY:  Modified indepdent  TRANSFERS: Assistive device utilized: None  Sit to stand: Complete Independence Stand to sit: Complete Independence Chair to chair: Modified independence Floor:  DNT    STAIRS:  Level of Assistance:  DNT    GAIT: Gait pattern: step through pattern Distance walked:  Assistive device utilized: Single point cane Level of assistance: SBA Comments: unsteady in turns  FUNCTIONAL TESTs:  5 times sit to stand: 15.25 sec  M-CTSIB  Condition 1: Firm Surface, EO 30 Sec, Normal Sway  Condition 2: Firm Surface, EC 30 Sec, Normal and Mild Sway  Condition 3: Foam Surface, EO 30 Sec, Mild Sway  Condition 4: Foam Surface, EC 30 Sec, Mild/Moderate Sway   Berg Balance Test: 41/56 Dynamic Gait Index: NT    PATIENT EDUCATION: Education details: continued assessment Person educated: Patient and Spouse Education method: Explanation Education comprehension: verbalized understanding      GOALS: Goals reviewed with patient? Yes  SHORT TERM GOALS: Target date: 10/18/2021  The patient will be independent with HEP for gaze adaptation, habituation, balance, and general mobility. Baseline: Goal status: IN PROGRESS  LONG TERM GOALS: Target date: 11/08/2021  Demonstrate reduced risk for falls per Merrilee Jansky Balance Test score 50/56 Baseline: 41/56 Goal status: IN PROGRESS  2.  Demonstrate low risk for falls per Dynamic Gait Index > 19/24 Baseline: NT Goal status: IN PROGRESS  ASSESSMENT:  CLINICAL IMPRESSION: Pt presents to OPPT today with reports of onset of spinning upon trying to get out of bed yesterday.  It subsided as the day went on yesterday.  Performed positional testing Dix-Hallpike R and L, no nystagmus or symptoms noted in session today.  Reviewed Brandt-Daroff exercise and pt/PT agreed for pt to perform it on his sofa, as this may  help him perform more effectively, versus on his high bed.  Worked remainder of session on balance exercises.  He will continue to benefit from skilled PT towards goals for improved balance, decreased fall risk.  OBJECTIVE IMPAIRMENTS Abnormal gait, decreased activity tolerance, decreased balance, difficulty walking, decreased ROM, dizziness, hypomobility, and postural dysfunction.   ACTIVITY LIMITATIONS carrying, lifting, bending, standing, sleeping, transfers, bed mobility, and locomotion level  PARTICIPATION LIMITATIONS: meal prep, cleaning, shopping, community activity, and yard work  PERSONAL FACTORS Age and Time since onset of injury/illness/exacerbation are also affecting patient's functional outcome.   REHAB POTENTIAL: Good  CLINICAL DECISION MAKING: Evolving/moderate complexity  EVALUATION COMPLEXITY: Moderate  PLAN: PT FREQUENCY: 1-2x/week  PT DURATION: 6 weeks  PLANNED INTERVENTIONS: Therapeutic exercises, Therapeutic activity, Neuromuscular re-education, Balance training, Gait training, Patient/Family education, Self Care, Joint mobilization, Stair training, Vestibular training, Canalith repositioning, Aquatic Therapy, Dry Needling, Spinal mobilization, Cryotherapy, Moist heat, Traction, and Manual therapy  PLAN FOR NEXT SESSION: Check STG by 10/18/2021.  Reassess positional testing if needed; Work on compliant surfaces, including incline/decline and progress balance HEP as appropriate.   Mady Haagensen, PT 10/16/21 2:45 PM Phone: [phone] Fax: [phone]   [facility] [place], [place], [place] [address] Phone # [phone] Fax # [phone]

## 2021-10-18 ENCOUNTER — Ambulatory Visit: Payer: Medicare Other

## 2021-10-18 DIAGNOSIS — R2681 Unsteadiness on feet: Secondary | ICD-10-CM

## 2021-10-18 DIAGNOSIS — R262 Difficulty in walking, not elsewhere classified: Secondary | ICD-10-CM

## 2021-10-18 DIAGNOSIS — R2689 Other abnormalities of gait and mobility: Secondary | ICD-10-CM

## 2021-10-18 NOTE — Therapy (Signed)
OUTPATIENT PHYSICAL THERAPY NEURO TREATMENT   Patient Name: [name] MRN: [id number] DOB:1926-09-18, 88 y.o., male Today's Date: 10/18/2021   PCP: [name], MD REFERRING PROVIDER: Marcial Pacas, MD    PT End of Session - 10/18/21 1403     Visit Number 7    Number of Visits 12    Date for PT Re-Evaluation 11/08/21    Authorization Type Medicare/Tricare    PT Start Time 1402    PT Stop Time 1445    PT Time Calculation (min) 43 min    Equipment Utilized During Treatment Gait belt    Activity Tolerance Patient tolerated treatment well    Behavior During Therapy WFL for tasks assessed/performed                 Past Medical History:  Diagnosis Date   Acute superficial venous thrombosis of lower extremity    a. RLE after CABG, neg dopp for DVT.   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 S/P stenting to mid RCA, prox PDA 06/1999. b. NSTEMI/CABG x 3 in 10/2011 with LIMA to LAD, SVG to PDA, and SVG to OM1.    Cephalalgia 07/14/2015   CHB (complete heart block) 05/03/2012   Overview:  Status post complete heart block heart rate 28 bpm, alternating with 2 to one AV block and drug for bradycardia.  Status post pacemaker implantation.status post Medtronic pacemaker implantation the 03/28/2012   Chronic diastolic CHF (congestive heart failure) 02/05/2018   Chronic gouty arthritis    Chronic UTI    a. Followed by Dr. Risa Grill - colonized/asymptomatic - not on abx   CKD (chronic kidney disease)    stage 3, GFR 30-59 ml/min; stable with a creatinine around 1.9-2.0 followed by nephrology.   Constipation 03/01/2015   Symptoms and exam consistent with constipation. Abdominal exam is benign with no evidence of pain or obstruction. Discussed importance of increasing fiber and water intake coupled with physical activity to assist with bowel movements. Continue  over-the-counter medication management as needed. Follow-up if symptoms worsen or fail to improve.   Coronary atherosclerosis 11/27/2011   Overview:  Multivessel coronary artery disease recently diagnosed by catheterization 2013 History of stent placement with a heparin-coated stent 2001  Last Assessment & Plan:  Status post coronary bypass grafting.  No recurrent chest pain. Overview:  Overview:  S/P stenting to mid RCA, prox PDA 06/1999;  06/2007 Myoview: negative except for apical thinning, EF 68%, last Myoview August 10, 2008 with n   Degeneration of lumbar intervertebral disc    Degenerative disc disease, cervical, with radiculopathy 99/35/7017   Diastolic dysfunction 79/39/0300   Grade 1 DD on Echo 06/2017   Dyslipidemia    Dysphagia 02/06/2016   3/18 - DG Esophagus:  1. Mild esophageal dysmotility, likely presbyesophagus. 2. No other explanation for patient's symptoms. 3. Small hiatal hernia.  On swallow eval - evidence of cervical spine disease and that was likely contributing   Echocardiogram    Echocardiogram 08/2018: EF 50-55, inf-lat AK, mild LVH, Gr 1 DD, RVSP 47, mild LAE, mild to mod MR, mild AI, mild AS (mean 11)   Epistaxis 12/08/2020   Essential hypertension 07/20/2006   Fatigue 05/18/2017   GERD (gastroesophageal reflux disease)    Gout 05/12/2018   Gouty arthritis of right great toe 09/03/2017   Left toe Injected  January 31, 2018    Hyperpigmentation 11/04/2017   Internal hemorrhoids 08/15/2016   Left inguinal hernia 07/01/2019   Lumbar post-laminectomy syndrome 12/12/2017   Moderate aortic regurgitation 07/04/2017   Echo 06/2017:  EF 55-60%, mild LVH, grade 1 DD, mild AS, mod AR, mild MR, mild-mod TR   Myocardial infarction    Neck injury    a. C3-C4 and C4-C5 foraminal narrowing, severe   Numbness of left foot 10/21/2019   Pacemaker 04/10/2012   Medtronic pacemaker  Last Assessment & Plan:  Status post pacemaker implantation for symptomatic bradycardia.  Pacemaker site  is slightly tender and erythematous.  A prescription of Keflex was dispensed.  I also asked the patient to closely followup with the EP service regarding his pacemaker placement. 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  This was switched to by mouth amiodarone at the time of the thoracic surgery appointment was discontinued.   Presence of aortocoronary bypass graft 10/24/2011   Overview:  Performed at [facility] 2013 Last Assessment & Plan:  No complication post coronary bypass grafting.   Prostate cancer ([place])    a. 2001 s/p TURP.   Pulmonary nodule    a. felt to be noncancerous.  Status post followup CT scan 4 mm and stable.   Renal artery stenosis    a. 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   Trochanteric bursitis of hip, bilateral 01/10/2017   UGIB (upper gastrointestinal bleed) 02/01/2018   EGD  02/06/18 - mod, non-erosive gastritis   Venous insufficiency of leg 06/05/2010   Vitamin B12 deficiency 08/23/2009   Jan '14  July '14 B12 level  >1500    472   Weakness of both lower extremities 04/09/2020   Past Surgical History:  Procedure Laterality Date   ANTERIOR CHAMBER WASHOUT Left 10/04/2018   Procedure: Anterior Chamber Washout, Vitreous Tap;  Surgeon: Jalene Mullet, MD;  Location: [place];  Service: Ophthalmology;  Laterality: Left;   cardia  catherization  07-07-99   CARDIAC SURGERY  10/18/12   open heart surgery   CATARACT EXTRACTION W/ INTRAOCULAR LENS  IMPLANT, BILATERAL  3/205, 06/2013   [facility]   COLONOSCOPY  04/12/07   CORONARY ARTERY BYPASS GRAFT  10/19/2011   Procedure: CORONARY ARTERY BYPASS GRAFTING (CABG);  Surgeon: Gaye Pollack, MD;  Location: [place];  Service: Open Heart Surgery;  Laterality: N/A;  times three using Left Internal Mammary Artery and Right Greater S[place]henouse Vein Graft harvested Endoscopically   edg  07-17-1994   FLEXIBLE SIGMOIDOSCOPY  11-03-1997   GAS INSERTION Left 10/04/2018   Procedure: Insertion Of Gas;  Surgeon: Jalene Mullet, MD;  Location: [place];  Service: Ophthalmology;  Laterality: Left;   GAS/FLUID EXCHANGE Left 10/04/2018   Procedure: Gas/Fluid Exchange;  Surgeon: Jalene Mullet, MD;  Location: [place];  Service: Ophthalmology;  Laterality: Left;   LEFT HEART CATHETERIZATION WITH CORONARY ANGIOGRAM N/A  10/16/2011   Procedure: LEFT HEART CATHETERIZATION WITH CORONARY ANGIOGRAM;  Surgeon: Peter M Martinique, MD;  Location: [facility] CATH LAB;  Service: Cardiovascular;  Laterality: N/A;   LEFT HEART CATHETERIZATION WITH CORONARY/GRAFT ANGIOGRAM N/A 03/11/2013   Procedure: LEFT HEART CATHETERIZATION WITH [name];  Surgeon: Blane Ohara, MD;  Location: [facility] C[facility] LAB;  Service: Cardiovascular;  Laterality: N/A;   lumbar spinal disk and neck fusion surgery     PACEMAKER INSERTION  03/28/12   PPM implanted for mobitz II AV block   PARS PLANA VITRECTOMY Left 10/04/2018   Procedure: PARS PLANA VITRECTOMY 25 GAUGE FOR ENDOPHTHALMITIS WITH INJECTION OF INTRAVITREAL ANTIBIOTIC;  Surgeon: Jalene Mullet, MD;  Location: [place];  Service: Ophthalmology;  Laterality: Left;   peripheral vascular catherization  11-24-03   PERMANENT PACEMAKER INSERTION N/A 03/28/2012   Procedure: PERMANENT PACEMAKER INSERTION;  Surgeon: Thompson Grayer, MD;  Location: [facility] CATH LAB;  Service: Cardiovascular;  Laterality: N/A;    PROSTATECTOMY     renal circulation  10-01-03   s/p ptca     stents     X 2   stress cardiolite  05-04-05   spring 09-negative except for apical thinning, EF 68%   Patient Active Problem List   Diagnosis Date Noted   Gait abnormality 09/21/2021   History of cervical discectomy 09/21/2021   Polyarthralgia    Chronic gouty arthritis    Degeneration of lumbar intervertebral disc    Bilateral plantar fasciitis 10/25/2020   Weakness of both lower extremities 04/09/2020   S/P inguinal hernia repair 08/13/2019   Left inguinal hernia 07/01/2019   Gout 05/12/2018   Chronic diastolic CHF (congestive heart failure) 02/05/2018   UGIB (upper gastrointestinal bleed) 02/01/2018   Lumbar post-laminectomy syndrome 12/12/2017   Hyperpigmentation 11/04/2017   Spinal stenosis of lumbar region 10/09/2017   Frequent urination 09/03/2017   Gouty arthritis of right great toe 09/03/2017   Moderate aortic regurgitation 27/07/8673   Diastolic dysfunction 44/92/0100   Edema 06/29/2017   Trochanteric bursitis of hip, bilateral 01/10/2017   Internal hemorrhoids 08/15/2016   Cephalalgia 07/14/2015   Constipation 03/01/2015   CHB (complete heart block) 05/03/2012   Postoperative atrial fibrillation 05/03/2012   Pacemaker 04/10/2012   AV block, 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 03/26/2012   Coronary atherosclerosis 11/27/2011   Presence of aortocoronary bypass graft 10/24/2011   Pulmonary nodule    CAD (coronary artery disease)    CKD (chronic kidney disease) stage 3, GFR 30-59 ml/min    Venous insufficiency of leg 06/05/2010   Insomnia 11/09/2009   Pain in joint, shoulder region 10/14/2009   Vitamin B12 deficiency 08/23/2009   Peripheral neuropathy 03/21/2009   Degenerative disc disease, cervical, with radiculopathy 09/21/2008   Vertigo 08/29/2007   Dyslipidemia 01/17/2007   Essential hypertension 07/20/2006   Prostate cancer 07/20/2006    ONSET DATE: BLE issues for years,   REFERRING DIAG:  R26.9 (ICD-10-CM) - Gait abnormality Z98.890 (ICD-10-CM) - History of cervical discectomy   THERAPY DIAG:  Unsteadiness on feet  Other abnormalities of gait and mobility  Difficulty in walking, not elsewhere classified  Rationale for Evaluation and Treatment Rehabilitation  SUBJECTIVE:  SUBJECTIVE STATEMENT: Feeling tired and winded no matter what I'm doing today. Last bad episode of vertigo occurred Sunday Pt accompanied by: significant other  PERTINENT HISTORY: see PMH  PAIN:  Are you having pain? No  PRECAUTIONS: Fall  PATIENT GOALS improve balance, eliminate dizziness  OBJECTIVE:  Vitals at start of session: 103/62 mmHg, 85 bpm, 98% Vitals at end of session: 127/67 mmHg, 90 bpm, 97%  TODAY'S TREATMENT: 10/18/21 Activity Comments  Corner balance -feet together: EO/EC x 30 sec; head turns/nods 5x EO/EC -semi-tandem EO/EC x 30 sec   Brandt-Daroff 1x left/right, no symptoms with either position  Forward step over obstacle, 10 reps each leg leading   Side step over obstacle, 10 reps each leg leading Cues for increased step width to allow room for BLE           TODAY'S TREATMENT: 10/16/2021 Activity Comments  Reviewed/performed Brandt-Daroff exercises, x 1 rep each side Mild feeling upon return up to sitting from left side  R Dix-Hallpike Negative symptoms, no nystagmus  L Dix-Hallpike Negative symptoms, no nystagmus  Sidestepping along parallel bars, 5 reps , no UE support   Forward step over obstacle, 10 reps each leg leading Light BUE support  Side step over obstacle, 10 reps each leg leading Light BUE support  Seated hamstring stretch, 3 x 30 sec Foot propped on 6" block  Standing on Airex:  heel/toe raises x 10, lateral weightshifting x 10 reps; EC head steady 3 x 15 sec  Min  guard assist   PATIENT EDUCATION: Education details: Continue to perform Brandt-Daroff as prescribed; try to do this from sofa (as his bed is very high Person educated: Patient Education method: Explanation and Demonstration Education comprehension: verbalized understanding and returned demonstration       HOME EXERCISE PROGRAM Last updated: 10/09/21 Access Code: [facility] URL: [email or web address] Date: 10/09/2021 Prepared by: [facility]  Exercises - Corner Balance Feet Together With Eyes Open  - 1 x daily - 3 x weekly - 3 sets - 15-30 sec hold - Corner Balance Feet Together With Eyes Closed  - 1 x daily - 3 x weekly - 3 sets - 15-30 sec hold - Corner Balance Feet Together: Eyes Open With Head Turns  - 1 x daily - 3 x weekly - 3 sets - 5 repetitions hold - Corner Balance Feet Together: Eyes Closed With Head Turns  - 1 x daily - 3 x weekly - 3 sets - 5 reps - Semi-Tandem Corner Balance With Eyes Open  - 1 x daily - 3 x weekly - 3 sets - 15-30 sec hold - Semi-Tandem Corner Balance With Eyes Closed  - 1 x daily - 3 x weekly - 3 sets - 15-30 sec hold - Brandt-Daroff Vestibular Exercise  - 1 x daily - 5 x weekly - 2 sets - 3-5 reps - Standing Toe Taps  - 1 x daily - 5 x weekly - 2 sets - 10 reps - Backward Walking with Counter Support  - 1 x daily - 5 x weekly - 2 sets - 10 reps      Below measures were taken at time of initial evaluation unless otherwise specified:  Vestibular assessment:  (has macular degeneration, wears trifocals)  Oculomotor:  Convergence 8 inches Saccades WNL Smooth pursuits WNL VOR: NT VOR Cancellation: NT Head Impulse Test: negative, but difficulty in performing due to restricted neck movements and pt guarding against passive movement  Roll Test left/right:  negative Dix-Hallpike left/right: negative--very limited neck extension Sidelying test left/right: negative--very limited neck  extension   DIAGNOSTIC FINDINGS: IMPRESSION: cervical CT 1. No evidence of acute fracture or traumatic malalignment. 2. Similar severe multilevel degenerative change.  COGNITION: Overall cognitive status: Within functional limits for tasks assessed   SENSATION: WFL  COORDINATION: WNL  EDEMA:    MUSCLE TONE: DNT   DTRs:  DNT  POSTURE: rounded shoulders and forward head  LOWER EXTREMITY ROM:     WFL  LOWER EXTREMITY MMT:    BLE gross strength assessed in sitting and demo 5/5  BED MOBILITY:  Modified indepdent  TRANSFERS: Assistive device utilized: None  Sit to stand: Complete Independence Stand to sit: Complete Independence Chair to chair: Modified independence Floor:  DNT    STAIRS:  Level of Assistance:  DNT    GAIT: Gait pattern: step through pattern Distance walked:  Assistive device utilized: Single point cane Level of assistance: SBA Comments: unsteady in turns  FUNCTIONAL TESTs:  5 times sit to stand: 15.25 sec  M-CTSIB  Condition 1: Firm Surface, EO 30 Sec, Normal Sway  Condition 2: Firm Surface, EC 30 Sec, Normal and Mild Sway  Condition 3: Foam Surface, EO 30 Sec, Mild Sway  Condition 4: Foam Surface, EC 30 Sec, Mild/Moderate Sway   Berg Balance Test: 41/56 Dynamic Gait Index: NT    PATIENT EDUCATION: Education details: continued assessment Person educated: Patient and Spouse Education method: Explanation Education comprehension: verbalized understanding      GOALS: Goals reviewed with patient? Yes  SHORT TERM GOALS: Target date: 10/18/2021  The patient will be independent with HEP for gaze adaptation, habituation, balance, and general mobility. Baseline: Goal status: MET  LONG TERM GOALS: Target date: 11/08/2021  Demonstrate reduced risk for falls per Merrilee Jansky Balance Test score 50/56 Baseline: 41/56 Goal status: IN PROGRESS  2.  Demonstrate low risk for falls per Dynamic Gait Index > 19/24 Baseline: NT Goal  status: IN PROGRESS  ASSESSMENT:  CLINICAL IMPRESSION: Demonstrates good initial HEP recall and performing well with static balance activities and improved balance/body awareness with eyes closed condition demonstrating mild sway with feet together, eyes closed. Slight perturbation with head movements and eyes closed condition horizontal > vertical. No provocation with Brandt-Daroff for either side or position (sidelying vs vertical). Continued sessions to progress to dynamic balance coupled with head movements to improve functional performance  OBJECTIVE IMPAIRMENTS Abnormal gait, decreased activity tolerance, decreased balance, difficulty walking, decreased ROM, dizziness, hypomobility, and postural dysfunction.   ACTIVITY LIMITATIONS carrying, lifting, bending, standing, sleeping, transfers, bed mobility, and locomotion level  PARTICIPATION LIMITATIONS: meal prep, cleaning, shopping, community activity, and yard work  PERSONAL FACTORS Age and Time since onset of injury/illness/exacerbation are also affecting patient's functional outcome.   REHAB POTENTIAL: Good  CLINICAL DECISION MAKING: Evolving/moderate complexity  EVALUATION COMPLEXITY: Moderate  PLAN: PT FREQUENCY: 1-2x/week  PT DURATION: 6 weeks  PLANNED INTERVENTIONS: Therapeutic exercises, Therapeutic activity, Neuromuscular re-education, Balance training, Gait training, Patient/Family education, Self Care, Joint mobilization, Stair training, Vestibular training, Canalith repositioning, Aquatic Therapy, Dry Needling, Spinal mobilization, Cryotherapy, Moist heat, Traction, and Manual therapy  PLAN FOR NEXT SESSION: dynamic balance activities with head movements  2:53 PM, 10/18/21 M. Sherlyn Lees, PT, DPT Physical Therapist- [place] Office Number: [phone]    [place] [facility] [address] [place], [place] [address] Phone # [phone] Fax # [phone]

## 2021-10-23 ENCOUNTER — Encounter: Payer: Self-pay | Admitting: Physical Therapy

## 2021-10-23 ENCOUNTER — Ambulatory Visit: Payer: Medicare Other | Admitting: Physical Therapy

## 2021-10-23 DIAGNOSIS — R2681 Unsteadiness on feet: Secondary | ICD-10-CM | POA: Diagnosis not present

## 2021-10-23 DIAGNOSIS — R2689 Other abnormalities of gait and mobility: Secondary | ICD-10-CM

## 2021-10-23 DIAGNOSIS — R262 Difficulty in walking, not elsewhere classified: Secondary | ICD-10-CM

## 2021-10-23 NOTE — Progress Notes (Signed)
Remote pacemaker transmission.   

## 2021-10-23 NOTE — Therapy (Signed)
OUTPATIENT PHYSICAL THERAPY NEURO TREATMENT   Patient Name: [name] MRN: [id number] DOB:1926-02-07, 88 y.o., male Today's Date: 10/23/2021   PCP: [name], MD REFERRING PROVIDER: Marcial Pacas, MD    PT End of Session - 10/23/21 1405     Visit Number 8    Number of Visits 12    Date for PT Re-Evaluation 11/08/21    Authorization Type Medicare/Tricare    PT Start Time 1405    PT Stop Time 1448    PT Time Calculation (min) 43 min    Equipment Utilized During Treatment --    Activity Tolerance Patient tolerated treatment well    Behavior During Therapy WFL for tasks assessed/performed                  Past Medical History:  Diagnosis Date   Acute superficial venous thrombosis of lower extremity    a. RLE after CABG, neg dopp for DVT.   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 S/P stenting to mid RCA, prox PDA 06/1999. b. NSTEMI/CABG x 3 in 10/2011 with LIMA to LAD, SVG to PDA, and SVG to OM1.    Cephalalgia 07/14/2015   CHB (complete heart block) 05/03/2012   Overview:  Status post complete heart block heart rate 28 bpm, alternating with 2 to one AV block and drug for bradycardia.  Status post pacemaker implantation.status post Medtronic pacemaker implantation the 03/28/2012   Chronic diastolic CHF (congestive heart failure) 02/05/2018   Chronic gouty arthritis    Chronic UTI    a. Followed by Dr. Risa Grill - colonized/asymptomatic - not on abx   CKD (chronic kidney disease)    stage 3, GFR 30-59 ml/min; stable with a creatinine around 1.9-2.0 followed by nephrology.   Constipation 03/01/2015   Symptoms and exam consistent with constipation. Abdominal exam is benign with no evidence of pain or obstruction. Discussed importance of increasing fiber and water intake coupled with physical activity to assist with bowel movements. Continue  over-the-counter medication management as needed. Follow-up if symptoms worsen or fail to improve.   Coronary atherosclerosis 11/27/2011   Overview:  Multivessel coronary artery disease recently diagnosed by catheterization 2013 History of stent placement with a heparin-coated stent 2001  Last Assessment & Plan:  Status post coronary bypass grafting.  No recurrent chest pain. Overview:  Overview:  S/P stenting to mid RCA, prox PDA 06/1999;  06/2007 Myoview: negative except for apical thinning, EF 68%, last Myoview August 10, 2008 with n   Degeneration of lumbar intervertebral disc    Degenerative disc disease, cervical, with radiculopathy 38/10/1749   Diastolic dysfunction 02/58/5277   Grade 1 DD on Echo 06/2017   Dyslipidemia    Dysphagia 02/06/2016   3/18 - DG Esophagus:  1. Mild esophageal dysmotility, likely presbyesophagus. 2. No other explanation for patient's symptoms. 3. Small hiatal hernia.  On swallow eval - evidence of cervical spine disease and that was likely contributing   Echocardiogram    Echocardiogram 08/2018: EF 50-55, inf-lat AK, mild LVH, Gr 1 DD, RVSP 47, mild LAE, mild to mod MR, mild AI, mild AS (mean 11)   Epistaxis 12/08/2020   Essential hypertension 07/20/2006   Fatigue 05/18/2017   GERD (gastroesophageal reflux disease)    Gout 05/12/2018   Gouty arthritis of right great toe 09/03/2017   Left toe Injected  January 31, 2018    Hyperpigmentation 11/04/2017   Internal hemorrhoids 08/15/2016   Left inguinal hernia 07/01/2019   Lumbar post-laminectomy syndrome 12/12/2017   Moderate aortic regurgitation 07/04/2017   Echo 06/2017:  EF 55-60%, mild LVH, grade 1 DD, mild AS, mod AR, mild MR, mild-mod TR   Myocardial infarction    Neck injury    a. C3-C4 and C4-C5 foraminal narrowing, severe   Numbness of left foot 10/21/2019   Pacemaker 04/10/2012   Medtronic pacemaker  Last Assessment & Plan:  Status post pacemaker implantation for symptomatic bradycardia.  Pacemaker site  is slightly tender and erythematous.  A prescription of Keflex was dispensed.  I also asked the patient to closely followup with the EP service regarding his pacemaker placement. 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  This was switched to by mouth amiodarone at the time of the thoracic surgery appointment was discontinued.   Presence of aortocoronary bypass graft 10/24/2011   Overview:  Performed at [facility] 2013 Last Assessment & Plan:  No complication post coronary bypass grafting.   Prostate cancer ([place])    a. 2001 s/p TURP.   Pulmonary nodule    a. felt to be noncancerous.  Status post followup CT scan 4 mm and stable.   Renal artery stenosis    a. 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   Trochanteric bursitis of hip, bilateral 01/10/2017   UGIB (upper gastrointestinal bleed) 02/01/2018   EGD  02/06/18 - mod, non-erosive gastritis   Venous insufficiency of leg 06/05/2010   Vitamin B12 deficiency 08/23/2009   Jan '14  July '14 B12 level  >1500    472   Weakness of both lower extremities 04/09/2020   Past Surgical History:  Procedure Laterality Date   ANTERIOR CHAMBER WASHOUT Left 10/04/2018   Procedure: Anterior Chamber Washout, Vitreous Tap;  Surgeon: Jalene Mullet, MD;  Location: [place];  Service: Ophthalmology;  Laterality: Left;   cardia  catherization  07-07-99   CARDIAC SURGERY  10/18/12   open heart surgery   CATARACT EXTRACTION W/ INTRAOCULAR LENS  IMPLANT, BILATERAL  3/205, 06/2013   [facility]   COLONOSCOPY  04/12/07   CORONARY ARTERY BYPASS GRAFT  10/19/2011   Procedure: CORONARY ARTERY BYPASS GRAFTING (CABG);  Surgeon: Gaye Pollack, MD;  Location: [place];  Service: Open Heart Surgery;  Laterality: N/A;  times three using Left Internal Mammary Artery and Right Greater S[place]henouse Vein Graft harvested Endoscopically   edg  07-17-1994   FLEXIBLE SIGMOIDOSCOPY  11-03-1997   GAS INSERTION Left 10/04/2018   Procedure: Insertion Of Gas;  Surgeon: Jalene Mullet, MD;  Location: [place];  Service: Ophthalmology;  Laterality: Left;   GAS/FLUID EXCHANGE Left 10/04/2018   Procedure: Gas/Fluid Exchange;  Surgeon: Jalene Mullet, MD;  Location: [place];  Service: Ophthalmology;  Laterality: Left;   LEFT HEART CATHETERIZATION WITH CORONARY ANGIOGRAM N/A  10/16/2011   Procedure: LEFT HEART CATHETERIZATION WITH CORONARY ANGIOGRAM;  Surgeon: Peter M Martinique, MD;  Location[facility] C[facility] LAB;  Service: Cardiovascular;  Laterality: N/A;   LEFT HEART CATHETERIZATION WITH CORONARY/GRAFT ANGIOGRAM N/A 03/11/2013   Procedure: LEFT HEART CATHETERIZATION WITH [name];  Surgeon: Blane Ohara, MD;  Location: [facility] C[facility] LAB;  Service: Cardiovascular;  Laterality: N/A;   lumbar spinal disk and neck fusion surgery     PACEMAKER INSERTION  03/28/12   PPM implanted for mobitz II AV block   PARS PLANA VITRECTOMY Left 10/04/2018   Procedure: PARS PLANA VITRECTOMY 25 GAUGE FOR ENDOPHTHALMITIS WITH INJECTION OF INTRAVITREAL ANTIBIOTIC;  Surgeon: Jalene Mullet, MD;  Location: [place];  Service: Ophthalmology;  Laterality: Left;   peripheral vascular catherization  11-24-03   PERMANENT PACEMAKER INSERTION N/A 03/28/2012   Procedure: PERMANENT PACEMAKER INSERTION;  Surgeon: Thompson Grayer, MD;  Location: [facility] CATH LAB;  Service: Cardiovascular;  Laterality: N/A;    PROSTATECTOMY     renal circulation  10-01-03   s/p ptca     stents     X 2   stress cardiolite  05-04-05   spring 09-negative except for apical thinning, EF 68%   Patient Active Problem List   Diagnosis Date Noted   Gait abnormality 09/21/2021   History of cervical discectomy 09/21/2021   Polyarthralgia    Chronic gouty arthritis    Degeneration of lumbar intervertebral disc    Bilateral plantar fasciitis 10/25/2020   Weakness of both lower extremities 04/09/2020   S/P inguinal hernia repair 08/13/2019   Left inguinal hernia 07/01/2019   Gout 05/12/2018   Chronic diastolic CHF (congestive heart failure) 02/05/2018   UGIB (upper gastrointestinal bleed) 02/01/2018   Lumbar post-laminectomy syndrome 12/12/2017   Hyperpigmentation 11/04/2017   Spinal stenosis of lumbar region 10/09/2017   Frequent urination 09/03/2017   Gouty arthritis of right great toe 09/03/2017   Moderate aortic regurgitation 83/41/9622   Diastolic dysfunction 29/79/8921   Edema 06/29/2017   Trochanteric bursitis of hip, bilateral 01/10/2017   Internal hemorrhoids 08/15/2016   Cephalalgia 07/14/2015   Constipation 03/01/2015   CHB (complete heart block) 05/03/2012   Postoperative atrial fibrillation 05/03/2012   Pacemaker 04/10/2012   AV block, 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 03/26/2012   Coronary atherosclerosis 11/27/2011   Presence of aortocoronary bypass graft 10/24/2011   Pulmonary nodule    CAD (coronary artery disease)    CKD (chronic kidney disease) stage 3, GFR 30-59 ml/min    Venous insufficiency of leg 06/05/2010   Insomnia 11/09/2009   Pain in joint, shoulder region 10/14/2009   Vitamin B12 deficiency 08/23/2009   Peripheral neuropathy 03/21/2009   Degenerative disc disease, cervical, with radiculopathy 09/21/2008   Vertigo 08/29/2007   Dyslipidemia 01/17/2007   Essential hypertension 07/20/2006   Prostate cancer 07/20/2006    ONSET DATE: BLE issues for years,   REFERRING DIAG:  R26.9 (ICD-10-CM) - Gait abnormality Z98.890 (ICD-10-CM) - History of cervical discectomy   THERAPY DIAG:  Unsteadiness on feet  Other abnormalities of gait and mobility  Difficulty in walking, not elsewhere classified  Rationale for Evaluation and Treatment Rehabilitation  SUBJECTIVE:  SUBJECTIVE STATEMENT: Had an episode this morning, lying on my back, then rolling to the left, trying to come up from sitting.  Felt like slow motion coming across my eyes.   Pt accompanied by: significant other  PERTINENT HISTORY: see PMH  PAIN:  Are you having pain? No  PRECAUTIONS: Fall  PATIENT GOALS improve balance, eliminate dizziness  OBJECTIVE:      POSITIONAL TESTING:  Right Roll Test: c/o dizziness, nystagmus noted, >30 seconds (appears upbeating); Duration: >30 sec Left Roll Test: No nystagmus, no dizziness reported; Duration: None Right Dix-Hallpike: c/o dizziness symptoms 3-4/10, R upbeating nystagmus 30 sec; also reports symptoms strongly upon return to sit.  Duration:30 sec Left Dix-Hallpike: No nystagmus, no dizziness; Duration: None   *Dix-Hallpike and Epley neck extension limited due to neck ROM limitations into extension; utilized pillow behind back to help assist into more motion.  Pt without c/o    TODAY'S TREATMENT: 10/23/2021 Activity Comments  R Epley maneuver, performed x 2 reps 1st rep, "feels like a rock" 2-3/10 symptoms, resolves 5-10 sec; no symptoms "feels like it wants to start"  Practiced L Brandt-Daroff x 5 reps, to elevated pillows, as pt reports symptoms come on more from midway up to sitting Performed this way (addended HEP) to allow for quicker movements               Access Code: [email or web address] URL: [email or web address] Date: 10/23/2021-most recent  additions Prepared by: [facility]  Exercises - Corner Balance Feet Together With Eyes Open  - 1 x daily - 3 x weekly - 3 sets - 15-30 sec hold - Corner Balance Feet Together With Eyes Closed  - 1 x daily - 3 x weekly - 3 sets - 15-30 sec hold - Corner Balance Feet Together: Eyes Open With Head Turns  - 1 x daily - 3 x weekly - 3 sets - 5 repetitions hold - Corner Balance Feet Together: Eyes Closed With Head Turns  - 1 x daily - 3 x weekly - 3 sets - 5 reps - Semi-Tandem Corner Balance With Eyes Open  - 1 x daily - 3 x weekly - 3 sets - 15-30 sec hold - Semi-Tandem Corner Balance With Eyes Closed  - 1 x daily - 3 x weekly - 3 sets - 15-30 sec hold - Brandt-Daroff Vestibular Exercise  - 1 x daily - 5 x weekly - 2 sets - 3-5 reps - Standing Toe Taps  - 1 x daily - 5 x weekly - 2 sets - 10 reps - Backward Walking with Counter Support  - 1 x daily - 5 x weekly - 2 sets - 10 reps - Supine to Right Sidelying Vestibular Habituation  - 1 x daily - 7 x weekly - 1 sets - 3-5 reps - 30 sec hold - Supine to Left Sidelying Vestibular Habituation  - 1 x daily - 7 x weekly - 1 sets - 3-5 reps - 30 sec hold  PATIENT EDUCATION: Education details: Updates to HEP-Brandt-Daroff modified to pillows for improved speed, rolling R and L; educated pt in mechanisms of his positional vertigo and that due to his neck ROM limitiations, he is likely guarding and not moving quickly enough for the spinning sensation to take hold.  Discussed benefits of continued motion for habituation, including rolling Person educated: Patient Education method: Explanation and Demonstration Education comprehension: verbalized understanding and returned demonstration  Below measures were taken at time of initial evaluation unless otherwise specified:  Vestibular assessment:  (has macular degeneration, wears trifocals)  Oculomotor:  Convergence 8 inches Saccades WNL Smooth  pursuits WNL VOR: NT VOR Cancellation: NT Head Impulse Test: negative, but difficulty in performing due to restricted neck movements and pt guarding against passive movement  Roll Test left/right: negative Dix-Hallpike left/right: negative--very limited neck extension Sidelying test left/right: negative--very limited neck extension   DIAGNOSTIC FINDINGS: IMPRESSION: cervical CT 1. No evidence of acute fracture or traumatic malalignment. 2. Similar severe multilevel degenerative change.  COGNITION: Overall cognitive status: Within functional limits for tasks assessed   SENSATION: WFL  COORDINATION: WNL  EDEMA:    MUSCLE TONE: DNT   DTRs:  DNT  POSTURE: rounded shoulders and forward head  LOWER EXTREMITY ROM:     WFL  LOWER EXTREMITY MMT:    BLE gross strength assessed in sitting and demo 5/5  BED MOBILITY:  Modified indepdent  TRANSFERS: Assistive device utilized: None  Sit to stand: Complete Independence Stand to sit: Complete Independence Chair to chair: Modified independence Floor:  DNT    STAIRS:  Level of Assistance:  DNT    GAIT: Gait pattern: step through pattern Distance walked:  Assistive device utilized: Single point cane Level of assistance: SBA Comments: unsteady in turns  FUNCTIONAL TESTs:  5 times sit to stand: 15.25 sec  M-CTSIB  Condition 1: Firm Surface, EO 30 Sec, Normal Sway  Condition 2: Firm Surface, EC 30 Sec, Normal and Mild Sway  Condition 3: Foam Surface, EO 30 Sec, Mild Sway  Condition 4: Foam Surface, EC 30 Sec, Mild/Moderate Sway   Berg Balance Test: 41/56 Dynamic Gait Index: NT    PATIENT EDUCATION: Education details: continued assessment Person educated: Patient and Spouse Education method: Explanation Education comprehension: verbalized understanding      GOALS: Goals reviewed with patient? Yes  SHORT TERM GOALS: Target date: 10/18/2021  The patient will be independent with HEP for gaze  adaptation, habituation, balance, and general mobility. Baseline: Goal status: MET  LONG TERM GOALS: Target date: 11/08/2021  Demonstrate reduced risk for falls per Merrilee Jansky Balance Test score 50/56 Baseline: 41/56 Goal status: IN PROGRESS  2.  Demonstrate low risk for falls per Dynamic Gait Index > 19/24 Baseline: NT Goal status: IN PROGRESS  ASSESSMENT:  CLINICAL IMPRESSION: Pt presents to OPPT today reporting have another episode this morning, where he got dizzy/"slow motion vision" upon rolling to L side and coming up to sit from L side.  Assessed for positional vertigo again today, with R upbeating nystagmus noted and dizziness reported with R Dix-Hallpike.  Also symptoms noted/some nystagmus noted R roll test.  Treated for posterior canal BPPV with Epley maneuver, with pt noting symptoms improved with 2 reps.  In multiple positions, pt reports he feels that dizziness is about to come on.  Discussed/educated in neck flexibility/ROM limitations possibly contributing to muscle guarding, decreased movement with rolling and bed mobility which may be contributing to overall motion sensitivity. Added rolling to HEP for habituation to address.  Will continue to assess for positional vertigo, motion sensitivity, balance and overall functional mobility.  OBJECTIVE IMPAIRMENTS Abnormal gait, decreased activity tolerance, decreased balance, difficulty walking, decreased ROM, dizziness, hypomobility, and postural dysfunction.   ACTIVITY LIMITATIONS carrying, lifting, bending, standing, sleeping, transfers, bed mobility, and locomotion level  PARTICIPATION LIMITATIONS: meal prep, cleaning, shopping, community activity, and yard work  PERSONAL FACTORS Age and Time since onset of injury/illness/exacerbation are also affecting patient's  functional outcome.   REHAB POTENTIAL: Good  CLINICAL DECISION MAKING: Evolving/moderate complexity  EVALUATION COMPLEXITY: Moderate  PLAN: PT FREQUENCY:  1-2x/week  PT DURATION: 6 weeks  PLANNED INTERVENTIONS: Therapeutic exercises, Therapeutic activity, Neuromuscular re-education, Balance training, Gait training, Patient/Family education, Self Care, Joint mobilization, Stair training, Vestibular training, Canalith repositioning, Aquatic Therapy, Dry Needling, Spinal mobilization, Cryotherapy, Moist heat, Traction, and Manual therapy  PLAN FOR NEXT SESSION: Reassess Dix-Hallpike and roll test if needed; review updates to HEP; continue dynamic balance activities with head movements towards LTGs.  Mady Haagensen, PT 10/23/21 5:16 PM Phone: [phone] Fax: [phone]   [facility] at [facility] [address], [place], [place] [address] Phone # [phone] Fax # [phone]

## 2021-10-25 ENCOUNTER — Ambulatory Visit: Payer: Medicare Other

## 2021-10-25 DIAGNOSIS — R2689 Other abnormalities of gait and mobility: Secondary | ICD-10-CM

## 2021-10-25 DIAGNOSIS — R262 Difficulty in walking, not elsewhere classified: Secondary | ICD-10-CM

## 2021-10-25 DIAGNOSIS — R2681 Unsteadiness on feet: Secondary | ICD-10-CM

## 2021-10-25 NOTE — Therapy (Signed)
OUTPATIENT PHYSICAL THERAPY NEURO TREATMENT   Patient Name: [name] MRN: [id number] DOB:[birth year], 88 y.o., male Today's Date: 10/25/2021   PCP: [name], MD REFERRING PROVIDER: Marcial Pacas, MD    PT End of Session - 10/25/21 1355     Visit Number 9    Number of Visits 12    Date for PT Re-Evaluation 11/08/21    Authorization Type Medicare/Tricare    PT Start Time 1400    PT Stop Time 1445    PT Time Calculation (min) 45 min    Activity Tolerance Patient tolerated treatment well    Behavior During Therapy WFL for tasks assessed/performed                  Past Medical History:  Diagnosis Date   Acute superficial venous thrombosis of lower extremity    a. RLE after CABG, neg dopp for DVT.   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 S/P stenting to mid RCA, prox PDA 06/1999. b. NSTEMI/CABG x 3 in 10/2011 with LIMA to LAD, SVG to PDA, and SVG to OM1.    Cephalalgia 07/14/2015   CHB (complete heart block) 05/03/2012   Overview:  Status post complete heart block heart rate 28 bpm, alternating with 2 to one AV block and drug for bradycardia.  Status post pacemaker implantation.status post Medtronic pacemaker implantation the 03/28/2012   Chronic diastolic CHF (congestive heart failure) 02/05/2018   Chronic gouty arthritis    Chronic UTI    a. Followed by Dr. Risa Grill - colonized/asymptomatic - not on abx   CKD (chronic kidney disease)    stage 3, GFR 30-59 ml/min; stable with a creatinine around 1.9-2.0 followed by nephrology.   Constipation 03/01/2015   Symptoms and exam consistent with constipation. Abdominal exam is benign with no evidence of pain or obstruction. Discussed importance of increasing fiber and water intake coupled with physical activity to assist with bowel movements. Continue over-the-counter medication management as needed.  Follow-up if symptoms worsen or fail to improve.   Coronary atherosclerosis 11/27/2011   Overview:  Multivessel coronary artery disease recently diagnosed by catheterization 2013 History of stent placement with a heparin-coated stent 2001  Last Assessment & Plan:  Status post coronary bypass grafting.  No recurrent chest pain. Overview:  Overview:  S/P stenting to mid RCA, prox PDA 06/1999;  06/2007 Myoview: negative except for apical thinning, EF 68%, last Myoview August 10, 2008 with n   Degeneration of lumbar intervertebral disc    Degenerative disc disease, cervical, with radiculopathy 43/27/6147   Diastolic dysfunction 10/06/5745   Grade 1 DD on Echo 06/2017   Dyslipidemia    Dysphagia 02/06/2016   3/18 - DG Esophagus:  1. Mild esophageal dysmotility, likely presbyesophagus. 2. No other explanation for patient's symptoms. 3. Small hiatal hernia.  On swallow eval - evidence of cervical spine disease and that was likely contributing   Echocardiogram    Echocardiogram 08/2018: EF 50-55, inf-lat AK, mild LVH, Gr 1 DD, RVSP 47, mild LAE, mild to mod MR, mild AI, mild AS (mean 11)   Epistaxis 12/08/2020   Essential hypertension 07/20/2006   Fatigue 05/18/2017   GERD (gastroesophageal reflux disease)    Gout 05/12/2018   Gouty arthritis of right great toe 09/03/2017   Left toe Injected January 31, 2018    Hyperpigmentation 11/04/2017  Internal hemorrhoids 08/15/2016   Left inguinal hernia 07/01/2019   Lumbar post-laminectomy syndrome 12/12/2017   Moderate aortic regurgitation 07/04/2017   Echo 06/2017:  EF 55-60%, mild LVH, grade 1 DD, mild AS, mod AR, mild MR, mild-mod TR   Myocardial infarction    Neck injury    a. C3-C4 and C4-C5 foraminal narrowing, severe   Numbness of left foot 10/21/2019   Pacemaker 04/10/2012   Medtronic pacemaker  Last Assessment & Plan:  Status post pacemaker implantation for symptomatic bradycardia.  Pacemaker site is slightly tender and erythematous.  A  prescription of Keflex was dispensed.  I also asked the patient to closely followup with the EP service regarding his pacemaker placement. 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  This was switched to by mouth amiodarone at the time of the thoracic surgery appointment was discontinued.   Presence of aortocoronary bypass graft 10/24/2011   Overview:  Performed at [facility] 2013 Last Assessment & Plan:  No complication post coronary bypass grafting.   Prostate cancer ([place])    a. 2001 s/p TURP.   Pulmonary nodule    a. felt to be noncancerous.  Status post followup CT scan 4 mm and stable.   Renal artery stenosis    a. 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   Trochanteric bursitis of hip, bilateral 01/10/2017   UGIB (upper gastrointestinal bleed) 02/01/2018   EGD  02/06/18 - mod, non-erosive gastritis   Venous insufficiency of leg 06/05/2010   Vitamin B12 deficiency 08/23/2009   Jan '14  July '14 B12 level  >1500    472   Weakness of both lower extremities 04/09/2020   Past Surgical History:  Procedure Laterality Date   ANTERIOR CHAMBER WASHOUT Left 10/04/2018   Procedure: Anterior Chamber Washout, Vitreous Tap;  Surgeon: Jalene Mullet, MD;  Location: [place];  Service: Ophthalmology;  Laterality: Left;   cardia catherization  07-07-99   CARDIAC SURGERY   10/18/12   open heart surgery   CATARACT EXTRACTION W/ INTRAOCULAR LENS  IMPLANT, BILATERAL  3/205, 06/2013   [facility]   COLONOSCOPY  04/12/07   CORONARY ARTERY BYPASS GRAFT  10/19/2011   Procedure: CORONARY ARTERY BYPASS GRAFTING (CABG);  Surgeon: Gaye Pollack, MD;  Location: [place];  Service: Open Heart Surgery;  Laterality: N/A;  times three using Left Internal Mammary Artery and Right Greater Saphenouse Vein Graft harvested Endoscopically   edg  07-17-1994   FLEXIBLE SIGMOIDOSCOPY  11-03-1997   GAS INSERTION Left 10/04/2018   Procedure: Insertion Of Gas;  Surgeon: Jalene Mullet, MD;  Location: [place];  Service: Ophthalmology;  Laterality: Left;   GAS/FLUID EXCHANGE Left 10/04/2018   Procedure: Gas/Fluid Exchange;  Surgeon: Jalene Mullet, MD;  Location: [place];  Service: Ophthalmology;  Laterality: Left;   LEFT HEART CATHETERIZATION WITH CORONARY ANGIOGRAM N/A 10/16/2011   Procedure: LEFT HEART CATHETERIZATION WITH CORONARY ANGIOGRAM;  Surgeon: Peter M Martinique, MD;  Location: [facility] C[facility] LAB;  Service: Cardiovascular;  Laterality: N/A;   LEFT HEART CATHETERIZATION WITH CORONARY/GRAFT ANGIOGRAM N/A 03/11/2013   Procedure: LEFT HEART CATHETERIZATION WITH [name];  Surgeon: Blane Ohara, MD;  Location: [facility] CATH LAB;  Service: Cardiovascular;  Laterality: N/A;   lumbar spinal disk and neck fusion surgery     PACEMAKER INSERTION  03/28/12   PPM implanted for mobitz II AV block   PARS PLANA VITRECTOMY Left 10/04/2018   Procedure: PARS PLANA VITRECTOMY 25 GAUGE FOR ENDOPHTHALMITIS WITH INJECTION OF INTRAVITREAL ANTIBIOTIC;  Surgeon: Jalene Mullet, MD;  Location: [place];  Service: Ophthalmology;  Laterality: Left;   peripheral vascular catherization  11-24-03   PERMANENT PACEMAKER INSERTION N/A 03/28/2012   Procedure: PERMANENT PACEMAKER INSERTION;  Surgeon: Thompson Grayer, MD;  Location[facility];  Service: Cardiovascular;  Laterality: N/A;   PROSTATECTOMY     renal circulation  10-01-03    s/p ptca     stents     X 2   stress cardiolite  05-04-05   spring 09-negative except for apical thinning, EF 68%   Patient Active Problem List   Diagnosis Date Noted   Gait abnormality 09/21/2021   History of cervical discectomy 09/21/2021   Polyarthralgia    Chronic gouty arthritis    Degeneration of lumbar intervertebral disc    Bilateral plantar fasciitis 10/25/2020   Weakness of both lower extremities 04/09/2020   S/P inguinal hernia repair 08/13/2019   Left inguinal hernia 07/01/2019   Gout 05/12/2018   Chronic diastolic CHF (congestive heart failure) 02/05/2018   UGIB (upper gastrointestinal bleed) 02/01/2018   Lumbar post-laminectomy syndrome 12/12/2017   Hyperpigmentation 11/04/2017   Spinal stenosis of lumbar region 10/09/2017   Frequent urination 09/03/2017   Gouty arthritis of right great toe 09/03/2017   Moderate aortic regurgitation 18/98/4210   Diastolic dysfunction 31/28/1188   Edema 06/29/2017   Trochanteric bursitis of hip, bilateral 01/10/2017   Internal hemorrhoids 08/15/2016   Cephalalgia 07/14/2015   Constipation 03/01/2015   CHB (complete heart block) 05/03/2012   Postoperative atrial fibrillation 05/03/2012   Pacemaker 04/10/2012   AV block, 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 03/26/2012   Coronary atherosclerosis 11/27/2011   Presence of aortocoronary bypass graft 10/24/2011   Pulmonary nodule    CAD (coronary artery disease)    CKD (chronic kidney disease) stage 3, GFR 30-59 ml/min    Venous insufficiency of leg 06/05/2010   Insomnia 11/09/2009   Pain in joint, shoulder region 10/14/2009   Vitamin B12 deficiency 08/23/2009   Peripheral neuropathy 03/21/2009   Degenerative disc disease, cervical, with radiculopathy 09/21/2008   Vertigo 08/29/2007   Dyslipidemia 01/17/2007   Essential hypertension 07/20/2006   Prostate cancer 07/20/2006    ONSET DATE: BLE issues for years,   REFERRING DIAG: R26.9 (ICD-10-CM) - Gait abnormality Z98.890  (ICD-10-CM) - History of cervical discectomy   THERAPY DIAG:  Unsteadiness on feet  Other abnormalities of gait and mobility  Difficulty in walking, not elsewhere classified  Rationale for Evaluation and Treatment Rehabilitation  SUBJECTIVE:  SUBJECTIVE STATEMENT: Had another episode yesterday AM when arising from bed and felt intense spinning. Notes that he feels residual effects of pressure across the forehead, temple to temple.  Pt accompanied by: significant other  PERTINENT HISTORY: see PMH  PAIN:  Are you having pain? No  PRECAUTIONS: Fall  PATIENT GOALS improve balance, eliminate dizziness  OBJECTIVE:   TODAY'S TREATMENT: 10/25/21 Activity Comments  Right and Left Sidelying tests No response  Right and Left Dix-Hallpike No response other than pain in right temple when right DH which was relieved with returning to sitting  VOR x 1 (slow) 10 reps Horizontal: 2 instances of slip/correction--right side Vertical: no issue   VOR cancellation Difficulty with tracking right side  Self-directed Head Impulse Test No issues, decreased speed  Head Impulse Test Guarded to movement but able to elicit 1-2 slips with Right  Corner balance -feet together EO/EC x 30 sec -head turns 5x EO/EC -semi-tandem x 30 sec  Standing on foam, feet apart -feet together EO/EC x 30 sec -head turns 5x EO/EC -semi-tandem x 30 sec       POSITIONAL TESTING:  Right Roll Test: c/o dizziness, nystagmus noted, >30 seconds (appears upbeating); Duration: >30 sec Left Roll Test: No nystagmus, no dizziness reported; Duration: None Right Dix-Hallpike: c/o dizziness symptoms 3-4/10, R upbeating nystagmus 30 sec; also reports symptoms strongly upon return to sit.  Duration:30 sec Left Dix-Hallpike: No nystagmus, no  dizziness; Duration: None   *Dix-Hallpike and Epley neck extension limited due to neck ROM limitations into extension; utilized pillow behind back to help assist into more motion.  Pt without c/o    TODAY'S TREATMENT: 10/23/2021 Activity Comments  R Epley maneuver, performed x 2 reps 1st rep, "feels like a rock" 2-3/10 symptoms, resolves 5-10 sec; no symptoms "feels like it wants to start"  Practiced L Brandt-Daroff x 5 reps, to elevated pillows, as pt reports symptoms come on more from midway up to sitting Performed this way (addended HEP) to allow for quicker movements               Access Code: [email or web address] URL: [email or web address] Date: 10/23/2021-most recent additions Prepared by: [facility]  Exercises - Corner Balance Feet Together With Eyes Open  - 1 x daily - 3 x weekly - 3 sets - 15-30 sec hold - Corner Balance Feet Together With Eyes Closed  - 1 x daily - 3 x weekly - 3 sets - 15-30 sec hold - Corner Balance Feet Together: Eyes Open With Head Turns  - 1 x daily - 3 x weekly - 3 sets - 5 repetitions hold - Corner Balance Feet Together: Eyes Closed With Head Turns  - 1 x daily - 3 x weekly - 3 sets - 5 reps - Semi-Tandem Corner Balance With Eyes Open  - 1 x daily - 3 x weekly - 3 sets - 15-30 sec hold - Semi-Tandem Corner Balance With Eyes Closed  - 1 x daily - 3 x weekly - 3 sets - 15-30 sec hold - Brandt-Daroff Vestibular Exercise  - 1 x daily - 5 x weekly - 2 sets - 3-5 reps - Standing Toe Taps  - 1 x daily - 5 x weekly - 2 sets - 10 reps - Backward Walking with Counter Support  - 1 x daily - 5 x weekly - 2 sets - 10 reps - Supine to Right Sidelying Vestibular Habituation  - 1 x daily - 7 x weekly -  1 sets - 3-5 reps - 30 sec hold - Supine to Left Sidelying Vestibular Habituation  - 1 x daily - 7 x weekly - 1 sets - 3-5 reps - 30 sec hold  PATIENT EDUCATION: Education details: Updates to HEP-Brandt-Daroff modified to pillows  for improved speed, rolling R and L; educated pt in mechanisms of his positional vertigo and that due to his neck ROM limitiations, he is likely guarding and not moving quickly enough for the spinning sensation to take hold.  Discussed benefits of continued motion for habituation, including rolling Person educated: Patient Education method: Explanation and Demonstration Education comprehension: verbalized understanding and returned demonstration            Below measures were taken at time of initial evaluation unless otherwise specified:  Vestibular assessment:  (has macular degeneration, wears trifocals)  Oculomotor:  Convergence 8 inches Saccades WNL Smooth pursuits WNL VOR: NT VOR Cancellation: NT Head Impulse Test: negative, but difficulty in performing due to restricted neck movements and pt guarding against passive movement  Roll Test left/right: negative Dix-Hallpike left/right: negative--very limited neck extension Sidelying test left/right: negative--very limited neck extension   DIAGNOSTIC FINDINGS: IMPRESSION: cervical CT 1. No evidence of acute fracture or traumatic malalignment. 2. Similar severe multilevel degenerative change.  COGNITION: Overall cognitive status: Within functional limits for tasks assessed   SENSATION: WFL  COORDINATION: WNL  EDEMA:    MUSCLE TONE: DNT   DTRs:  DNT  POSTURE: rounded shoulders and forward head  LOWER EXTREMITY ROM:     WFL  LOWER EXTREMITY MMT:    BLE gross strength assessed in sitting and demo 5/5  BED MOBILITY:  Modified indepdent  TRANSFERS: Assistive device utilized: None  Sit to stand: Complete Independence Stand to sit: Complete Independence Chair to chair: Modified independence Floor:  DNT    STAIRS:  Level of Assistance:  DNT    GAIT: Gait pattern: step through pattern Distance walked:  Assistive device utilized: Single point cane Level of assistance: SBA Comments: unsteady in  turns  FUNCTIONAL TESTs:  5 times sit to stand: 15.25 sec  M-CTSIB  Condition 1: Firm Surface, EO 30 Sec, Normal Sway  Condition 2: Firm Surface, EC 30 Sec, Normal and Mild Sway  Condition 3: Foam Surface, EO 30 Sec, Mild Sway  Condition 4: Foam Surface, EC 30 Sec, Mild/Moderate Sway   Berg Balance Test: 41/56 Dynamic Gait Index: NT    PATIENT EDUCATION: Education details: continued assessment Person educated: Patient and Spouse Education method: Explanation Education comprehension: verbalized understanding      GOALS: Goals reviewed with patient? Yes  SHORT TERM GOALS: Target date: 10/18/2021  The patient will be independent with HEP for gaze adaptation, habituation, balance, and general mobility. Baseline: Goal status: MET  LONG TERM GOALS: Target date: 11/08/2021  Demonstrate reduced risk for falls per Merrilee Jansky Balance Test score 50/56 Baseline: 41/56 Goal status: IN PROGRESS  2.  Demonstrate low risk for falls per Dynamic Gait Index > 19/24 Baseline: NT Goal status: IN PROGRESS  ASSESSMENT:  CLINICAL IMPRESSION:  No provocation to positional testing elicited, note some instances of slip with slow VOR in horizontal. Mild sway with balance activities with feet together and mild sway with feet apart on foam. Difficulty with head impulse test due to neck stiffness and guarding. Continued sessions to progress vestibular rehab as indicated  OBJECTIVE IMPAIRMENTS Abnormal gait, decreased activity tolerance, decreased balance, difficulty walking, decreased ROM, dizziness, hypomobility, and postural dysfunction.   ACTIVITY LIMITATIONS carrying, lifting,  bending, standing, sleeping, transfers, bed mobility, and locomotion level  PARTICIPATION LIMITATIONS: meal prep, cleaning, shopping, community activity, and yard work  PERSONAL FACTORS Age and Time since onset of injury/illness/exacerbation are also affecting patient's functional outcome.   REHAB POTENTIAL:  Good  CLINICAL DECISION MAKING: Evolving/moderate complexity  EVALUATION COMPLEXITY: Moderate  PLAN: PT FREQUENCY: 1-2x/week  PT DURATION: 6 weeks  PLANNED INTERVENTIONS: Therapeutic exercises, Therapeutic activity, Neuromuscular re-education, Balance training, Gait training, Patient/Family education, Self Care, Joint mobilization, Stair training, Vestibular training, Canalith repositioning, Aquatic Therapy, Dry Needling, Spinal mobilization, Cryotherapy, Moist heat, Traction, and Manual therapy  PLAN FOR NEXT SESSION: Reassess Dix-Hallpike and roll test if needed; review updates to HEP; continue dynamic balance activities with head movements towards LTGs. Re-check VOR. Progress note  1:55 PM, 10/25/21 M. Sherlyn Lees, PT, DPT Physical Therapist- [place] Office Number: [phone]    [facility] [address], [place], [place] [address] Phone # [phone] Fax # [phone]

## 2021-10-27 ENCOUNTER — Ambulatory Visit (INDEPENDENT_AMBULATORY_CARE_PROVIDER_SITE_OTHER): Payer: Medicare Other | Admitting: *Deleted

## 2021-10-27 ENCOUNTER — Telehealth: Payer: Self-pay | Admitting: *Deleted

## 2021-10-27 DIAGNOSIS — E538 Deficiency of other specified B group vitamins: Secondary | ICD-10-CM

## 2021-10-27 DIAGNOSIS — H61899 Other specified disorders of external ear, unspecified ear: Secondary | ICD-10-CM

## 2021-10-27 MED ORDER — CYANOCOBALAMIN 1000 MCG/ML IJ SOLN
1000.0000 ug | Freq: Once | INTRAMUSCULAR | Status: AC
  Administered 2021-10-27: 1000 ug via INTRAMUSCULAR

## 2021-10-27 NOTE — Telephone Encounter (Signed)
Pt came-in for b12 shot, wanted to let Dr. Quay Burow --Bilateral ears have dry skin and requesting ENT referral. Pt denied fever and pain.

## 2021-10-27 NOTE — Progress Notes (Signed)
Administered B12 1000 mcg/ml left deltoid. Pt tolerated well. 

## 2021-10-27 NOTE — Telephone Encounter (Signed)
Referral order for [facility] ENT.  It may take a while for him to get in there.  He can use an over-the-counter cortisone cream to help with the dryness or a small amount of oil-tea tree oil or something similar to moisturize the outer ear canal

## 2021-10-28 ENCOUNTER — Emergency Department (HOSPITAL_BASED_OUTPATIENT_CLINIC_OR_DEPARTMENT_OTHER)
Admission: EM | Admit: 2021-10-28 | Discharge: 2021-10-28 | Disposition: A | Discharge status: Home / Self Care | Payer: Medicare Other | Attending: Emergency Medicine | Admitting: Emergency Medicine

## 2021-10-28 ENCOUNTER — Encounter (HOSPITAL_BASED_OUTPATIENT_CLINIC_OR_DEPARTMENT_OTHER): Payer: Self-pay

## 2021-10-28 ENCOUNTER — Emergency Department (HOSPITAL_BASED_OUTPATIENT_CLINIC_OR_DEPARTMENT_OTHER): Payer: Medicare Other

## 2021-10-28 DIAGNOSIS — I509 Heart failure, unspecified: Secondary | ICD-10-CM | POA: Insufficient documentation

## 2021-10-28 DIAGNOSIS — Z8546 Personal history of malignant neoplasm of prostate: Secondary | ICD-10-CM | POA: Insufficient documentation

## 2021-10-28 DIAGNOSIS — I130 Hypertensive heart and chronic kidney disease with heart failure and stage 1 through stage 4 chronic kidney disease, or unspecified chronic kidney disease: Secondary | ICD-10-CM | POA: Insufficient documentation

## 2021-10-28 DIAGNOSIS — Z79899 Other long term (current) drug therapy: Secondary | ICD-10-CM | POA: Insufficient documentation

## 2021-10-28 DIAGNOSIS — Z951 Presence of aortocoronary bypass graft: Secondary | ICD-10-CM | POA: Diagnosis not present

## 2021-10-28 DIAGNOSIS — Z950 Presence of cardiac pacemaker: Secondary | ICD-10-CM | POA: Diagnosis not present

## 2021-10-28 DIAGNOSIS — K5900 Constipation, unspecified: Secondary | ICD-10-CM

## 2021-10-28 DIAGNOSIS — K5641 Fecal impaction: Secondary | ICD-10-CM | POA: Insufficient documentation

## 2021-10-28 DIAGNOSIS — Z7982 Long term (current) use of aspirin: Secondary | ICD-10-CM | POA: Diagnosis not present

## 2021-10-28 DIAGNOSIS — N189 Chronic kidney disease, unspecified: Secondary | ICD-10-CM | POA: Diagnosis not present

## 2021-10-28 DIAGNOSIS — K6289 Other specified diseases of anus and rectum: Secondary | ICD-10-CM

## 2021-10-28 DIAGNOSIS — Z91040 Latex allergy status: Secondary | ICD-10-CM | POA: Diagnosis not present

## 2021-10-28 DIAGNOSIS — I2510 Atherosclerotic heart disease of native coronary artery without angina pectoris: Secondary | ICD-10-CM | POA: Insufficient documentation

## 2021-10-28 DIAGNOSIS — R1031 Right lower quadrant pain: Secondary | ICD-10-CM | POA: Diagnosis present

## 2021-10-28 DIAGNOSIS — R1030 Lower abdominal pain, unspecified: Secondary | ICD-10-CM

## 2021-10-28 LAB — CBC WITH DIFFERENTIAL/PLATELET
Abs Immature Granulocytes: 0.02 10*3/uL (ref 0.00–0.07)
Basophils Absolute: 0.1 10*3/uL (ref 0.0–0.1)
Basophils Relative: 1 %
Eosinophils Absolute: 0.2 10*3/uL (ref 0.0–0.5)
Eosinophils Relative: 3 %
HCT: 34 % — ABNORMAL LOW (ref 39.0–52.0)
Hemoglobin: 10.8 g/dL — ABNORMAL LOW (ref 13.0–17.0)
Immature Granulocytes: 0 %
Lymphocytes Relative: 13 %
Lymphs Abs: 0.9 10*3/uL (ref 0.7–4.0)
MCH: 25.3 pg — ABNORMAL LOW (ref 26.0–34.0)
MCHC: 31.8 g/dL (ref 30.0–36.0)
MCV: 79.6 fL — ABNORMAL LOW (ref 80.0–100.0)
Monocytes Absolute: 0.5 10*3/uL (ref 0.1–1.0)
Monocytes Relative: 7 %
Neutro Abs: 5.1 10*3/uL (ref 1.7–7.7)
Neutrophils Relative %: 76 %
Platelets: 211 10*3/uL (ref 150–400)
RBC: 4.27 MIL/uL (ref 4.22–5.81)
RDW: 18.1 % — ABNORMAL HIGH (ref 11.5–15.5)
WBC: 6.7 10*3/uL (ref 4.0–10.5)
nRBC: 0 % (ref 0.0–0.2)

## 2021-10-28 LAB — COMPREHENSIVE METABOLIC PANEL
ALT: 8 U/L (ref 0–44)
AST: 14 U/L — ABNORMAL LOW (ref 15–41)
Albumin: 4.5 g/dL (ref 3.5–5.0)
Alkaline Phosphatase: 36 U/L — ABNORMAL LOW (ref 38–126)
Anion gap: 9 (ref 5–15)
BUN: 39 mg/dL — ABNORMAL HIGH (ref 8–23)
CO2: 22 mmol/L (ref 22–32)
Calcium: 10.3 mg/dL (ref 8.9–10.3)
Chloride: 107 mmol/L (ref 98–111)
Creatinine, Ser: 1.81 mg/dL — ABNORMAL HIGH (ref 0.61–1.24)
GFR, Estimated: 34 mL/min — ABNORMAL LOW (ref >60)
Glucose, Bld: 96 mg/dL (ref 70–99)
Potassium: 3.9 mmol/L (ref 3.5–5.1)
Sodium: 138 mmol/L (ref 135–145)
Total Bilirubin: 0.8 mg/dL (ref 0.3–1.2)
Total Protein: 7.6 g/dL (ref 6.5–8.1)

## 2021-10-28 LAB — LACTIC ACID, PLASMA: Lactic Acid, Venous: 1.1 mmol/L (ref 0.5–1.9)

## 2021-10-28 MED ORDER — POLYETHYLENE GLYCOL 3350 17 G PO PACK: 17.0000 g | PACK | Freq: Every day | ORAL | 0 refills | Status: DC

## 2021-10-28 MED ORDER — FLEET ENEMA 7-19 GM/118ML RE ENEM
1.0000 | ENEMA | Freq: Once | RECTAL | Status: AC
  Administered 2021-10-28: 1 via RECTAL
  Filled 2021-10-28: qty 1

## 2021-10-28 MED ORDER — DOCUSATE SODIUM 100 MG PO CAPS: 100.0000 mg | ORAL_CAPSULE | Freq: Every day | ORAL | 0 refills | Status: AC

## 2021-10-28 NOTE — ED Triage Notes (Signed)
He reports not having a b.m. x 7-8 days. He c/o some rectal pain since yesterday without bleeding. He tells me he has "kidney problems" and sees nephrology for this. He denies fever/vomiting.

## 2021-10-28 NOTE — ED Provider Notes (Signed)
[facility]ERGENC[facility] DEPT Provider Note   CSN: [id number] Arrival date & time: 10/28/21  1506     History {Add pertinent medical, surgical, social history, OB history to HPI:1} Chief Complaint  Patient presents with   Constipation    [name] is a 88 y.o. male.  HPI     Home Medications Prior to Admission medications   Medication Sig Start Date End Date Taking? Authorizing Provider  acetaminophen (TYLENOL) 500 MG tablet Take 500 mg by mouth every 6 (six) hours as needed.    [provider]  allopurinol (ZYLOPRIM) 100 MG tablet TAKE 1 TABLET(100 MG) BY MOUTH DAILY 08/10/21   [name], MD  amLODipine (NORVASC) 5 MG tablet TAKE 1 TABLET DAILY 12/26/20   [name], MD  Aromatic Inhalants (VICKS VAPOR INHALER IN) Place 1 puff into both nostrils as needed (for congestion).    [provider]  Ascorbic Acid (VITAMIN C) 1000 MG tablet     [provider]  aspirin EC 81 MG tablet Take 81 mg by mouth daily.    [provider]  Calcium Citrate-Vitamin D (CITRACAL + D PO) Take 2 tablets by mouth in the morning and at bedtime.    [provider]  Carboxymeth-Glycerin-Polysorb (REFRESH OPTIVE MEGA-3 OP) Place 1 drop into both eyes 2 (two) times daily.    [provider]  Cholecalciferol 25 MCG (1000 UT) capsule Take 1,000 Units by mouth daily.    [provider]  clobetasol cream (TEMOVATE) 0.05 % Apply topically 2 (two) times daily. 08/04/20   [provider]  cyanocobalamin (,VITAMIN B-12,) 1000 MCG/ML injection Inject 1,000 mcg into the muscle once. Monthly injection    [provider]  Cyanocobalamin (VITAMIN B12) 1000 MCG TBCR     [provider]  epoetin alfa-epbx (RETACRIT) 2000 UNIT/ML injection 2,000 Units every 14 (fourteen) days. Patient states he only gets it when he needs it but not every 2 weeks    [provider]  folic acid (FOLVITE) 1 MG tablet  Take 1 tablet (1 mg total) by mouth daily. Annual appt due in May must see provider for future refills 02/02/19   [name], MD  furosemide (LASIX) 40 MG tablet TAKE 1 TABLET BY MOUTH EVERY DAY Patient taking differently: as needed. 04/05/21   [name], MD  halobetasol (ULTRAVATE) 0.05 % cream Apply topically as needed. 12/19/20   [provider]  loratadine (CLARITIN) 10 MG tablet Take 10 mg by mouth daily as needed (for seasonal allergies).    [provider]  meclizine (ANTIVERT) 25 MG tablet TAKE 1 TABLET(25 MG) BY MOUTH AT BEDTIME AS NEEDED FOR DIZZINESS 09/29/21   [name], MD  Multiple Vitamins-Minerals (PRESERVISION AREDS 2 PO) Take by mouth.    [provider]  nitroGLYCERIN (NITROSTAT) 0.4 MG SL tablet DISSOLVE 1 TABLET UNDER THE TONGUE EVERY 5 MINUTES AS NEEDED FOR CHEST PAIN, MAXIMUM 3 TABLETS 04/30/19   Burns, Claudina Lick, MD  Saline (AYR NASAL MIST ALLERGY/SINUS NA) Place into the nose.    [provider]  Vitamin D, Ergocalciferol, (DRISDOL) 1.25 MG (50000 UNIT) CAPS capsule Take by mouth. 07/13/21   [provider]      Allergies    Aspirin, Lisinopril, Amoxicillin, Atarax [hydroxyzine hcl], Cephalexin, Ciprofloxacin, Clindamycin, Clobetasol, Codeine, Fish allergy, Fluarix [influenza virus vaccine], Haemophilus influenzae, Hydrocodone, Hydrocodone-acetaminophen, Hydroxyzine, Latex, Macrobid [nitrofurantoin macrocrystal], Niacin, Niacin-lovastatin er, Niacin-lovastatin er, Nitrofurantoin, Omeprazole, Other, Tramadol, Vibramycin [doxycycline], Adhesive [tape],  Bactrim [sulfamethoxazole-trimethoprim], Colchicine, Gabapentin, and Nortriptyline    Review of Systems   Review of Systems  Physical Exam Updated Vital Signs BP (!) 160/75 (BP Location: Right Arm)   Pulse 70   Temp 98.3 F (36.8 C) (Oral)   Resp 16   SpO2 100%  Physical Exam  ED Results / Procedures / Treatments   Labs (all labs ordered are listed, but only  abnormal results are displayed) Labs Reviewed - No data to display  EKG None  Radiology No results found.  Procedures Procedures  {Document cardiac monitor, telemetry assessment procedure when appropriate:1}  Medications Ordered in ED Medications - No data to display  ED Course/ Medical Decision Making/ A&P                           Medical Decision Making Amount and/or Complexity of Data Reviewed Labs: ordered. Radiology: ordered.  Risk OTC drugs.    [name] is a 88 y.o. male with a past medical history significant for hypertension, dyslipidemia, previous prostate cancer status postsurgery, CAD status post CABG, CKD, previous heart block with pacemaker, CHF, inguinal hernia status postrepair, and GERD who presents with abdominal pain, rectal pain, and constipation.  According to patient, for the last 7 or 8 days he has not had a bowel movement aside from several small amounts.  He reports he is still passing some gas but has had more severe abdominal pain waxing and waning across his lower abdomen and to his rectum.  He has no history of previous obstructions but he has had surgery on his abdomen before with the hernia surgeries.  He reports he does not feel a bulge in his groin and does not have any scrotal pain.  He denies any testicle pain.  He denies any trauma.  Denies any nausea, vomiting, or urinary symptoms whatsoever.  He denies any fevers or chills.  Denies congestion or cough.  He reports the pain gets severe at times when it comes in waves.  He denies any rectal bleeding or history of hemorrhoids.  On exam, lungs clear and chest nontender.  Abdomen was not focally tender but he was having grimacing with intermittent pain coming and going.  He did not have back pain on exam and his lungs were clear.  Normal bowel sounds were appreciated.  On rectal exam with a chaperone, I did palpate what was felt to be impacted stool right at the edge of the rectum.  A large  amount of it was removed.  Patient reports some his pressure in the rectum has improved he still having lower abdominal pain and burning pain in his abdomen.  Due to the history of abdominal surgeries, decreased bowel movement, and severe diffuse pain at times, will get CT scan to rule out partial obstruction, diverticulitis, abscess, or other acute abnormality.  Clinically suspect he has impaction that was partially removed by me and if imaging is reassuring and labs reassuring, will likely do enema and reassess.  Anticipate reassessment after work-up.  8:04 PM Patient CT scan does show a large 7 cm stool ball in the rectum that is causing possible developing stercoral colitis even on the CT without contrast due to his kidney function.  Patient has not male had a bowel movement after we did the attempted disimpaction.  Due to the continued pain, continued stool ball, and now evidence of possible developing stercoral colitis, will call general surgery to discuss.  We will  hold on doing enema at this time.   Spoke to Dr. Michaelle Birks who does feel patient's imaging is appropriate to attempt enema management here in the emergency department.  We will try this.  She did not feel he needs admission or antibiotics at this time.  We will do an enema and then she said if things were to start moving, he could likely be discharged with a good bowel regimen and close PCP follow-up.  Patient agrees to this plan.  We will start the enema.  {Document critical care time when appropriate:1} {Document review of labs and clinical decision tools ie heart score, Chads2Vasc2 etc:1}  {Document your independent review of radiology images, and any outside records:1} {Document your discussion with family members, caretakers, and with consultants:1} {Document social determinants of health affecting pt's care:1} {Document your decision making why or why not admission, treatments were needed:1} Final Clinical Impression(s) /  ED Diagnoses Final diagnoses:  None    Rx / DC Orders ED Discharge Orders     None

## 2021-10-28 NOTE — Discharge Instructions (Signed)
Your history, exam, work-up today were consistent with a fecal impaction causing the rectal and abdominal pain.  We did a manual fecal disimpaction attempt and then use an enema after the CT scan did not show acute obstruction.  I spoke to general surgery who felt the enema was appropriate and it was successful.  Due to the lack of complete stercoral colitis and now with resolution of the large stool, we do feel you are safe for discharge home and they recommended against antibiotics at this time.  Please use the Colace and the MiraLAX and maintain hydration at home to keep your bowels moving and follow-up with your primary doctor.  If any symptoms change or worsen acutely, please return to the nearest emergency department.

## 2021-10-30 ENCOUNTER — Ambulatory Visit: Payer: Medicare Other | Admitting: Physical Therapy

## 2021-10-30 NOTE — Telephone Encounter (Signed)
Spoke with patient today. 

## 2021-10-31 NOTE — Therapy (Signed)
OUTPATIENT PHYSICAL THERAPY NEURO PROGRESS [place] SUMMARY   Patient Name: [name] MRN: [id number] DOB:08-26-1926, 88 y.o., male Today's Date: 11/01/2021  Progress Note Reporting Period 09/27/21 to 11/01/21  See note below for Objective Data and Assessment of Progress/Goals.      PCP: [name], MD REFERRING PROVIDER: Marcial Pacas, MD    PT End of Session - 11/01/21 1446     Visit Number 10    Number of Visits 12    Date for PT Re-Evaluation 11/08/21    Authorization Type Medicare/Tricare    PT Start Time 1405    PT Stop Time 1450    PT Time Calculation (min) 45 min    Equipment Utilized During Treatment Gait belt    Activity Tolerance Patient tolerated treatment well    Behavior During Therapy WFL for tasks assessed/performed                   Past Medical History:  Diagnosis Date   Acute superficial venous thrombosis of lower extremity    a. RLE after CABG, neg dopp for DVT.   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 S/P stenting to mid RCA, prox PDA 06/1999. b. NSTEMI/CABG x 3 in 10/2011 with LIMA to LAD, SVG to PDA, and SVG to OM1.    Cephalalgia 07/14/2015   CHB (complete heart block) 05/03/2012   Overview:  Status post complete heart block heart rate 28 bpm, alternating with 2 to one AV block and drug for bradycardia.  Status post pacemaker implantation.status post Medtronic pacemaker implantation the 03/28/2012   Chronic diastolic CHF (congestive heart failure) 02/05/2018   Chronic gouty arthritis    Chronic UTI    a. Followed by Dr. Risa Grill - colonized/asymptomatic - not on abx   CKD (chronic kidney disease)    stage 3, GFR 30-59 ml/min; stable with a creatinine around 1.9-2.0 followed by nephrology.   Constipation 03/01/2015   Symptoms and exam consistent with constipation. Abdominal exam is benign with no  evidence of pain or obstruction. Discussed importance of increasing fiber and water intake coupled with physical activity to assist with bowel movements. Continue over-the-counter medication management as needed. Follow-up if symptoms worsen or fail to improve.   Coronary atherosclerosis 11/27/2011   Overview:  Multivessel coronary artery disease recently diagnosed by catheterization 2013 History of stent placement with a heparin-coated stent 2001  Last Assessment & Plan:  Status post coronary bypass grafting.  No recurrent chest pain. Overview:  Overview:  S/P stenting to mid RCA, prox PDA 06/1999;  06/2007 Myoview: negative except for apical thinning, EF 68%, last Myoview August 10, 2008 with n   Degeneration of lumbar intervertebral disc    Degenerative disc disease, cervical, with radiculopathy 53/74/8270   Diastolic dysfunction 78/67/5449   Grade 1 DD on Echo 06/2017   Dyslipidemia    Dysphagia 02/06/2016   3/18 - DG Esophagus:  1. Mild esophageal dysmotility, likely presbyesophagus. 2. No other explanation for patient's symptoms. 3. Small hiatal hernia.  On swallow eval - evidence of cervical spine disease and that was likely contributing   Echocardiogram    Echocardiogram 08/2018: EF 50-55, inf-lat AK, mild LVH, Gr 1 DD, RVSP 47, mild LAE, mild to mod MR, mild AI, mild AS (mean 11)   Epistaxis 12/08/2020   Essential hypertension 07/20/2006   Fatigue  05/18/2017   GERD (gastroesophageal reflux disease)    Gout 05/12/2018   Gouty arthritis of right great toe 09/03/2017   Left toe Injected January 31, 2018    Hyperpigmentation 11/04/2017   Internal hemorrhoids 08/15/2016   Left inguinal hernia 07/01/2019   Lumbar post-laminectomy syndrome 12/12/2017   Moderate aortic regurgitation 07/04/2017   Echo 06/2017:  EF 55-60%, mild LVH, grade 1 DD, mild AS, mod AR, mild MR, mild-mod TR   Myocardial infarction    Neck injury    a. C3-C4 and C4-C5 foraminal narrowing, severe   Numbness of left foot  10/21/2019   Pacemaker 04/10/2012   Medtronic pacemaker  Last Assessment & Plan:  Status post pacemaker implantation for symptomatic bradycardia.  Pacemaker site is slightly tender and erythematous.  A prescription of Keflex was dispensed.  I also asked the patient to closely followup with the EP service regarding his pacemaker placement. 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  This was switched to by mouth amiodarone at the time of the thoracic surgery appointment was discontinued.   Presence of aortocoronary bypass graft 10/24/2011   Overview:  Performed at [facility] 2013 Last Assessment & Plan:  No complication post coronary bypass grafting.   Prostate cancer ([place])    a. 2001 s/p TURP.   Pulmonary nodule    a. felt to be noncancerous.  Status post followup CT scan 4 mm and stable.   Renal artery stenosis    a. 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   Trochanteric bursitis of hip, bilateral 01/10/2017   UGIB (upper gastrointestinal bleed) 02/01/2018   EGD  02/06/18 - mod, non-erosive gastritis   Venous insufficiency of leg 06/05/2010   Vitamin B12 deficiency 08/23/2009   Jan '14  July '14 B12 level  >1500    472   Weakness of both lower extremities 04/09/2020   Past Surgical History:  Procedure Laterality Date   ANTERIOR CHAMBER WASHOUT Left 10/04/2018    Procedure: Anterior Chamber Washout, Vitreous Tap;  Surgeon: Jalene Mullet, MD;  Location: [place];  Service: Ophthalmology;  Laterality: Left;   cardia catherization  07-07-99   CARDIAC SURGERY  10/18/12   open heart surgery   CATARACT EXTRACTION W/ INTRAOCULAR LENS  IMPLANT, BILATERAL  3/205, 06/2013   [facility]   COLONOSCOPY  04/12/07   CORONARY ARTERY BYPASS GRAFT  10/19/2011   Procedure: CORONARY ARTERY BYPASS GRAFTING (CABG);  Surgeon: Gaye Pollack, MD;  Location: [place];  Service: Open Heart Surgery;  Laterality: N/A;  times three using Left Internal Mammary Artery and Right Greater S[place]henouse Vein Graft harvested Endoscopically   edg  07-17-1994   FLEXIBLE SIGMOIDOSCOPY  11-03-1997   GAS INSERTION Left 10/04/2018   Procedure: Insertion Of Gas;  Surgeon: Jalene Mullet, MD;  Location: [place];  Service: Ophthalmology;  Laterality: Left;   GAS/FLUID EXCHANGE Left 10/04/2018   Procedure:  Gas/Fluid Exchange;  Surgeon: Jalene Mullet, MD;  Location: [place];  Service: Ophthalmology;  Laterality: Left;   LEFT HEART CATHETERIZATION WITH CORONARY ANGIOGRAM N/A 10/16/2011   Procedure: LEFT HEART CATHETERIZATION WITH CORONARY ANGIOGRAM;  Surgeon: Peter M Martinique, MD;  Location[facility];  Service: Cardiovascular;  Laterality: N/A;   LEFT HEART CATHETERIZATION WITH CORONARY/GRAFT ANGIOGRAM N/A 03/11/2013   Procedure: LEFT HEART CATHETERIZATION WITH [name];  Surgeon: Blane Ohara, MD;  Location: [facility] CATH LAB;  Service: Cardiovascular;  Laterality: N/A;   lumbar spinal disk and neck fusion surgery     PACEMAKER INSERTION  03/28/12   PPM implanted for mobitz II AV block   PARS PLANA VITRECTOMY Left 10/04/2018   Procedure: PARS PLANA VITRECTOMY 25 GAUGE FOR ENDOPHTHALMITIS WITH INJECTION OF INTRAVITREAL ANTIBIOTIC;  Surgeon: Jalene Mullet, MD;  Location: [place];  Service: Ophthalmology;  Laterality: Left;   peripheral vascular catherization  11-24-03   PERMANENT PACEMAKER INSERTION N/A  03/28/2012   Procedure: PERMANENT PACEMAKER INSERTION;  Surgeon: Thompson Grayer, MD;  [facility];  Service: Cardiovascular;  Laterality: N/A;   PROSTATECTOMY     renal circulation  10-01-03   s/p ptca     stents     X 2   stress cardiolite  05-04-05   spring 09-negative except for apical thinning, EF 68%   Patient Active Problem List   Diagnosis Date Noted   Gait abnormality 09/21/2021   History of cervical discectomy 09/21/2021   Polyarthralgia    Chronic gouty arthritis    Degeneration of lumbar intervertebral disc    Bilateral plantar fasciitis 10/25/2020   Weakness of both lower extremities 04/09/2020   S/P inguinal hernia repair 08/13/2019   Left inguinal hernia 07/01/2019   Gout 05/12/2018   Chronic diastolic CHF (congestive heart failure) 02/05/2018   UGIB (upper gastrointestinal bleed) 02/01/2018   Lumbar post-laminectomy syndrome 12/12/2017   Hyperpigmentation 11/04/2017   Spinal stenosis of lumbar region 10/09/2017   Frequent urination 09/03/2017   Gouty arthritis of right great toe 09/03/2017   Moderate aortic regurgitation 19/50/9326   Diastolic dysfunction 71/24/5809   Edema 06/29/2017   Trochanteric bursitis of hip, bilateral 01/10/2017   Internal hemorrhoids 08/15/2016   Cephalalgia 07/14/2015   Constipation 03/01/2015   CHB (complete heart block) 05/03/2012   Postoperative atrial fibrillation 05/03/2012   Pacemaker 04/10/2012   AV block, 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 03/26/2012   Coronary atherosclerosis 11/27/2011   Presence of aortocoronary bypass graft 10/24/2011   Pulmonary nodule    CAD (coronary artery disease)    CKD (chronic kidney disease) stage 3, GFR 30-59 ml/min    Venous insufficiency of leg 06/05/2010   Insomnia 11/09/2009   Pain in joint, shoulder region 10/14/2009   Vitamin B12 deficiency 08/23/2009   Peripheral neuropathy 03/21/2009   Degenerative disc disease, cervical, with radiculopathy 09/21/2008   Vertigo 08/29/2007    Dyslipidemia 01/17/2007   Essential hypertension 07/20/2006   Prostate cancer 07/20/2006    ONSET DATE: BLE issues for years,   REFERRING DIAG: R26.9 (ICD-10-CM) - Gait abnormality Z98.890 (ICD-10-CM) - History of cervical discectomy   THERAPY DIAG:  Unsteadiness on feet  Other abnormalities of gait and mobility  Difficulty in walking, not elsewhere classified  Rationale for Evaluation and Treatment Rehabilitation  SUBJECTIVE:  SUBJECTIVE STATEMENT: Did not come to last appointment Monday since he was in the hospital on Saturday. Had a brief episode of dizziness when the medics were flipping him on the table. Feels like dizziness has slowly lessening since starting therapy. Feels like dizziness is not back to normal, but balance is better.  Pt accompanied by: significant other  PERTINENT HISTORY: see PMH  PAIN:  Are you having pain? No  PRECAUTIONS: Fall  PATIENT GOALS improve balance, eliminate dizziness  OBJECTIVE:    TODAY'S TREATMENT: 11/01/21 Activity Comments  R roll test negative  L roll test  Negative   L sidelying >sit C/o very mild dizziness  L DH negative  R DH negative        [facility] PT Assessment - 11/01/21 0001       Berg Balance Test   Sit to Stand Able to stand without using hands and stabilize independently    Standing Unsupported Able to stand safely 2 minutes    Sitting with Back Unsupported but Feet Supported on Floor or Stool Able to sit safely and securely 2 minutes    Stand to Sit Sits safely with minimal use of hands    Transfers Able to transfer safely, minor use of hands    Standing Unsupported with Eyes Closed Able to stand 10 seconds safely    Standing Unsupported with Feet Together Able to place feet together independently and stand 1 minute  safely    From Standing, Reach Forward with Outstretched Arm Can reach confidently >25 cm (10")    From Standing Position, Pick up Object from Floor Able to pick up shoe safely and easily    From Standing Position, Turn to Look Behind Over each Shoulder Turn sideways only but maintains balance    Turn 360 Degrees Able to turn 360 degrees safely in 4 seconds or less    Standing Unsupported, Alternately Place Feet on Step/Stool Able to complete 4 steps without aid or supervision    Standing Unsupported, One Foot in Front Able to take small step independently and hold 30 seconds    Standing on One Leg Able to lift leg independently and hold 5-10 seconds    Total Score 49      Dynamic Gait Index   Level Surface Mild Impairment    Change in Gait Speed Mild Impairment    Gait with Horizontal Head Turns Normal    Gait with Vertical Head Turns Normal    Gait and Pivot Turn Normal    Step Over Obstacle Mild Impairment    Step Around Obstacles Normal    Steps Mild Impairment    Total Score 20    DGI comment: without AD; no dizziness              PATIENT EDUCATION: Education details: edu on exam findings and answering patient's questions  Person educated: Patient Education method: Explanation, Demonstration, Tactile cues, Verbal cues, and Handouts Education comprehension: verbalized understanding and returned demonstration  HOME EXERCISE PROGRAM Last updated: 11/01/21 Access Code: [email or web address] URL: [email or web address] Date: 11/01/2021 Prepared by: [facility]  Exercises - Brandt-Daroff Vestibular Exercise  - 1 x daily - 5 x weekly - 2 sets - 3-5 reps - Standing Toe Taps  - 1 x daily - 5 x weekly - 2 sets - 10 reps - Semi-Tandem Corner Balance With Eyes Open  - 1 x daily - 3 x weekly - 3 sets - 15-30 sec hold - Romberg  Stance Eyes Closed on Foam Pad  - 1 x daily - 5 x weekly - 2 sets - 30 sec hold - Romberg Stance on Foam Pad  - 1 x  daily - 5 x weekly - 2 sets - 30 sec hold - Romberg Stance on Foam Pad with Head Rotation  - 1 x daily - 5 x weekly - 2 sets - 10 reps    Below measures were taken at time of initial evaluation unless otherwise specified:  Vestibular assessment:  (has macular degeneration, wears trifocals)  Oculomotor:  Convergence 8 inches Saccades WNL Smooth pursuits WNL VOR: NT VOR Cancellation: NT Head Impulse Test: negative, but difficulty in performing due to restricted neck movements and pt guarding against passive movement  Roll Test left/right: negative Dix-Hallpike left/right: negative--very limited neck extension Sidelying test left/right: negative--very limited neck extension   DIAGNOSTIC FINDINGS: IMPRESSION: cervical CT 1. No evidence of acute fracture or traumatic malalignment. 2. Similar severe multilevel degenerative change.  COGNITION: Overall cognitive status: Within functional limits for tasks assessed   SENSATION: WFL  COORDINATION: WNL  EDEMA:    MUSCLE TONE: DNT   DTRs:  DNT  POSTURE: rounded shoulders and forward head  LOWER EXTREMITY ROM:     WFL  LOWER EXTREMITY MMT:    BLE gross strength assessed in sitting and demo 5/5  BED MOBILITY:  Modified indepdent  TRANSFERS: Assistive device utilized: None  Sit to stand: Complete Independence Stand to sit: Complete Independence Chair to chair: Modified independence Floor:  DNT    STAIRS:  Level of Assistance:  DNT    GAIT: Gait pattern: step through pattern Distance walked:  Assistive device utilized: Single point cane Level of assistance: SBA Comments: unsteady in turns  FUNCTIONAL TESTs:  5 times sit to stand: 15.25 sec  M-CTSIB  Condition 1: Firm Surface, EO 30 Sec, Normal Sway  Condition 2: Firm Surface, EC 30 Sec, Normal and Mild Sway  Condition 3: Foam Surface, EO 30 Sec, Mild Sway  Condition 4: Foam Surface, EC 30 Sec, Mild/Moderate Sway   Berg Balance Test: 41/56 Dynamic  Gait Index: NT    PATIENT EDUCATION: Education details: continued assessment Person educated: Patient and Spouse Education method: Explanation Education comprehension: verbalized understanding      GOALS: Goals reviewed with patient? Yes  SHORT TERM GOALS: Target date: 10/18/2021  The patient will be independent with HEP for gaze adaptation, habituation, balance, and general mobility. Baseline: Goal status: MET 11/01/21  LONG TERM GOALS: Target date: 11/08/2021  Demonstrate reduced risk for falls per [facility] Test score 50/56 Baseline: 41/56, 49/56 Goal status: MET 11/01/21  2.  Demonstrate low risk for falls per Dynamic Gait Index > 19/24 Baseline: 20  Goal status: MET 11/01/21  ASSESSMENT:  CLINICAL IMPRESSION: Patient arrived to session today after an ED visit on 10/28/21 for abdominal pain, rectal pain, and constipation. Denies related symptoms today. Reports improvement in dizziness and balance, but notes that dizziness is not yet back to baseline. Patient scored 498/56 on Berg, demonstrating good improvement in stability. Also scored 20/24 on DGI without AD, demonstrating decreased risk of falls. Positional testing was negative, patient only with slight c/o dizziness with L sidelying to sit. Updated HEP to address remaining deficits with instruction on safety with these exercises. Patient reported understanding of all edu and without complaints at end of session.   OBJECTIVE IMPAIRMENTS Abnormal gait, decreased activity tolerance, decreased balance, difficulty walking, decreased ROM, dizziness, hypomobility, and postural dysfunction.   ACTIVITY  LIMITATIONS carrying, lifting, bending, standing, sleeping, transfers, bed mobility, and locomotion level  PARTICIPATION LIMITATIONS: meal prep, cleaning, shopping, community activity, and yard work  PERSONAL FACTORS Age and Time since onset of injury/illness/exacerbation are also affecting patient's functional outcome.    REHAB POTENTIAL: Good  CLINICAL DECISION MAKING: Evolving/moderate complexity  EVALUATION COMPLEXITY: Moderate  PLAN: PT FREQUENCY: 1-2x/week  PT DURATION: 6 weeks  PLANNED INTERVENTIONS: Therapeutic exercises, Therapeutic activity, Neuromuscular re-education, Balance training, Gait training, Patient/Family education, Self Care, Joint mobilization, Stair training, Vestibular training, Canalith repositioning, Aquatic Therapy, Dry Needling, Spinal mobilization, Cryotherapy, Moist heat, Traction, and Manual therapy  PLAN FOR NEXT SESSION: DC at this time    PHYSICAL THERAPY DISCHARGE SUMMARY  Visits from Start of Care: 10  Current functional level related to goals / functional outcomes: See above clinical impression   Remaining deficits: Mild dizziness with sidelying>sit   Education / Equipment: HEP  Plan: Patient agrees to discharge.  Patient goals were met. Patient is being discharged due to meeting the stated rehab goals.        Janene Harvey, PT, DPT 11/01/21 5:22 PM  [facility] at [facility] [address] [place], [place], [place] [address] Phone # [phone] Fax # [phone]

## 2021-11-01 ENCOUNTER — Ambulatory Visit: Payer: Medicare Other | Admitting: Physical Therapy

## 2021-11-01 ENCOUNTER — Encounter: Payer: Self-pay | Admitting: Physical Therapy

## 2021-11-01 DIAGNOSIS — R2689 Other abnormalities of gait and mobility: Secondary | ICD-10-CM

## 2021-11-01 DIAGNOSIS — R2681 Unsteadiness on feet: Secondary | ICD-10-CM | POA: Diagnosis not present

## 2021-11-01 DIAGNOSIS — R262 Difficulty in walking, not elsewhere classified: Secondary | ICD-10-CM

## 2021-11-02 ENCOUNTER — Encounter: Payer: Self-pay | Admitting: Internal Medicine

## 2021-11-02 NOTE — Progress Notes (Signed)
Subjective:    Patient ID: [name], male    DOB: 10/23/1926, 88 y.o.   MRN: [id number]     HPI Aarish is here for follow up of his chronic medical problems, including polyneuropathy, leg edema lasix prn, htn, CAD, gout, CKD, prostate ca, B12 def, insomnia  ED 10/21 - constipation, lower abdominal pain - fecal impaction.  Received fleet enema.  Had large BM and relief of pain.  Sent home on colace, miralax.  No abx rx'd - colitis likely related to fecal impaction.  He has had a few light BMs, they have been liquid.  He is not currently taking anything as far as stool softeners/laxatives.    Dry, itchy  ears - it is inside the canal.  He tried cortisone.  It is better.     Medications and allergies reviewed with patient and updated if appropriate.  Current Outpatient Medications on File Prior to Visit  Medication Sig Dispense Refill   acetaminophen (TYLENOL) 500 MG tablet Take 500 mg by mouth every 6 (six) hours as needed.     allopurinol (ZYLOPRIM) 100 MG tablet TAKE 1 TABLET(100 MG) BY MOUTH DAILY 90 tablet 1   amLODipine (NORVASC) 5 MG tablet TAKE 1 TABLET DAILY 90 tablet 3   Aromatic Inhalants (VICKS VAPOR INHALER IN) Place 1 puff into both nostrils as needed (for congestion).     Ascorbic Acid (VITAMIN C) 1000 MG tablet      aspirin EC 81 MG tablet Take 81 mg by mouth daily.     Calcium Citrate-Vitamin D (CITRACAL + D PO) Take 2 tablets by mouth in the morning and at bedtime.     Carboxymeth-Glycerin-Polysorb (REFRESH OPTIVE MEGA-3 OP) Place 1 drop into both eyes 2 (two) times daily.     Cholecalciferol 25 MCG (1000 UT) capsule Take 1,000 Units by mouth daily.     clobetasol cream (TEMOVATE) 0.05 % Apply topically 2 (two) times daily.     cyanocobalamin (,VITAMIN B-12,) 1000 MCG/ML injection Inject 1,000 mcg into the muscle once. Monthly injection     Cyanocobalamin (VITAMIN B12) 1000 MCG TBCR      docusate sodium (COLACE) 100 MG capsule Take 1 capsule (100 mg  total) by mouth daily for 14 days. 14 capsule 0   epoetin alfa-epbx (RETACRIT) 2000 UNIT/ML injection 2,000 Units every 14 (fourteen) days. Patient states he only gets it when he needs it but not every 2 weeks     folic acid (FOLVITE) 1 MG tablet Take 1 tablet (1 mg total) by mouth daily. Annual appt due in May must see provider for future refills 90 tablet 0   furosemide (LASIX) 40 MG tablet TAKE 1 TABLET BY MOUTH EVERY DAY (Patient taking differently: as needed.) 90 tablet 0   halobetasol (ULTRAVATE) 0.05 % cream Apply topically as needed.     loratadine (CLARITIN) 10 MG tablet Take 10 mg by mouth daily as needed (for seasonal allergies).     meclizine (ANTIVERT) 25 MG tablet TAKE 1 TABLET(25 MG) BY MOUTH AT BEDTIME AS NEEDED FOR DIZZINESS 30 tablet 0   Multiple Vitamins-Minerals (PRESERVISION AREDS 2 PO) Take by mouth.     nitroGLYCERIN (NITROSTAT) 0.4 MG SL tablet DISSOLVE 1 TABLET UNDER THE TONGUE EVERY 5 MINUTES AS NEEDED FOR CHEST PAIN, MAXIMUM 3 TABLETS 100 tablet 1   polyethylene glycol (MIRALAX / GLYCOLAX) 17 g packet Take 17 g by mouth daily. 14 each 0   Saline (AYR NASAL MIST ALLERGY/SINUS NA)  Place into the nose.     Vitamin D, Ergocalciferol, (DRISDOL) 1.25 MG (50000 UNIT) CAPS capsule Take by mouth.     Current Facility-Administered Medications on File Prior to Visit  Medication Dose Route Frequency Provider Last Rate Last Admin   NON FORMULARY 1 application  1 application  Topical PRN Landis Martins, DPM         Review of Systems  Constitutional:  Negative for fever.  Respiratory:  Positive for shortness of breath (chronic- same). Negative for cough and wheezing.   Cardiovascular:  Positive for leg swelling (mild). Negative for chest pain and palpitations.  Gastrointestinal:  Positive for abdominal pain (LLQ mild) and constipation.  Neurological:  Negative for light-headedness and headaches.       Objective:   Vitals:   11/03/21 1336  BP: 130/78  Pulse: 71  Temp:  97.9 F (36.6 C)  SpO2: 99%   BP Readings from Last 3 Encounters:  11/03/21 130/78  10/28/21 (!) 162/82  09/21/21 (!) 145/74   Wt Readings from Last 3 Encounters:  11/03/21 156 lb (70.8 kg)  09/21/21 154 lb (69.9 kg)  09/20/21 153 lb 3.2 oz (69.5 kg)   Body mass index is 23.04 kg/m.    Physical Exam Constitutional:      General: He is not in acute distress.    Appearance: Normal appearance. He is not ill-appearing.  HENT:     Head: Normocephalic and atraumatic.  Eyes:     Conjunctiva/sclera: Conjunctivae normal.  Cardiovascular:     Rate and Rhythm: Normal rate and regular rhythm.     Heart sounds: Murmur (2/6 sys) heard.  Pulmonary:     Effort: Pulmonary effort is normal. No respiratory distress.     Breath sounds: Normal breath sounds. No wheezing or rales.  Musculoskeletal:     Right lower leg: No edema.     Left lower leg: No edema.  Skin:    General: Skin is warm and dry.     Findings: No rash.  Neurological:     Mental Status: He is alert. Mental status is at baseline.  Psychiatric:        Mood and Affect: Mood normal.        Lab Results  Component Value Date   WBC 6.7 10/28/2021   HGB 10.8 (L) 10/28/2021   HCT 34.0 (L) 10/28/2021   PLT 211 10/28/2021   GLUCOSE 96 10/28/2021   CHOL 122 09/04/2016   TRIG 59.0 09/04/2016   HDL 40.40 09/04/2016   LDLCALC 70 09/04/2016   ALT 8 10/28/2021   AST 14 (L) 10/28/2021   NA 138 10/28/2021   K 3.9 10/28/2021   CL 107 10/28/2021   CREATININE 1.81 (H) 10/28/2021   BUN 39 (H) 10/28/2021   CO2 22 10/28/2021   TSH 1.827 02/02/2018   PSA 0.44 07/18/2007   INR 1.2 (H) 03/04/2013     Assessment & Plan:    See Problem List for Assessment and Plan of chronic medical problems.

## 2021-11-02 NOTE — Patient Instructions (Addendum)
     Try metamucil to help solidify the stool.      Medications changes include :   none     Return in about 6 months (around 05/05/2022) for follow up.

## 2021-11-03 ENCOUNTER — Ambulatory Visit (INDEPENDENT_AMBULATORY_CARE_PROVIDER_SITE_OTHER): Payer: Medicare Other | Admitting: Internal Medicine

## 2021-11-03 VITALS — BP 130/78 | HR 71 | Temp 97.9°F | Ht 69.0 in | Wt 156.0 lb

## 2021-11-03 DIAGNOSIS — K5900 Constipation, unspecified: Secondary | ICD-10-CM

## 2021-11-03 DIAGNOSIS — E538 Deficiency of other specified B group vitamins: Secondary | ICD-10-CM

## 2021-11-03 DIAGNOSIS — G6289 Other specified polyneuropathies: Secondary | ICD-10-CM

## 2021-11-03 DIAGNOSIS — I2510 Atherosclerotic heart disease of native coronary artery without angina pectoris: Secondary | ICD-10-CM

## 2021-11-03 DIAGNOSIS — E785 Hyperlipidemia, unspecified: Secondary | ICD-10-CM

## 2021-11-03 DIAGNOSIS — C61 Malignant neoplasm of prostate: Secondary | ICD-10-CM

## 2021-11-03 DIAGNOSIS — I10 Essential (primary) hypertension: Secondary | ICD-10-CM

## 2021-11-03 DIAGNOSIS — N1832 Chronic kidney disease, stage 3b: Secondary | ICD-10-CM | POA: Diagnosis not present

## 2021-11-03 DIAGNOSIS — G4700 Insomnia, unspecified: Secondary | ICD-10-CM

## 2021-11-03 DIAGNOSIS — M1A30X0 Chronic gout due to renal impairment, unspecified site, without tophus (tophi): Secondary | ICD-10-CM

## 2021-11-03 DIAGNOSIS — R42 Dizziness and giddiness: Secondary | ICD-10-CM

## 2021-11-03 DIAGNOSIS — R609 Edema, unspecified: Secondary | ICD-10-CM

## 2021-11-03 NOTE — Assessment & Plan Note (Signed)
Chronic Stable, controlled Continue lasix 40 mg prn

## 2021-11-03 NOTE — Assessment & Plan Note (Signed)
Did PT  better

## 2021-11-03 NOTE — Assessment & Plan Note (Signed)
Chronic Taking tylenol

## 2021-11-03 NOTE — Assessment & Plan Note (Signed)
Chronic Slightly improved Following with nephrology

## 2021-11-03 NOTE — Assessment & Plan Note (Signed)
Chronic Recent impaction Doing ok for now  Advised to try metamucil daily Use stool softener or miralax as needed

## 2021-11-03 NOTE — Assessment & Plan Note (Signed)
Chronic Controlled Continue allopurinol 100 mg daily

## 2021-11-03 NOTE — Assessment & Plan Note (Signed)
Chronic BP well controlled Continue amlodipine 5 mg daily cmp reviewed from last week

## 2021-11-03 NOTE — Assessment & Plan Note (Signed)
Chronic Following with urology

## 2021-11-03 NOTE — Assessment & Plan Note (Signed)
Chronic Continue taking B12

## 2021-11-03 NOTE — Assessment & Plan Note (Addendum)
Chronic Poor balance Uses cane

## 2021-11-06 ENCOUNTER — Ambulatory Visit: Payer: Medicare Other

## 2021-11-08 ENCOUNTER — Ambulatory Visit: Payer: Medicare Other

## 2021-11-10 ENCOUNTER — Ambulatory Visit: Payer: Medicare Other | Admitting: Internal Medicine

## 2021-11-27 ENCOUNTER — Ambulatory Visit (INDEPENDENT_AMBULATORY_CARE_PROVIDER_SITE_OTHER): Payer: Medicare Other | Admitting: *Deleted

## 2021-11-27 DIAGNOSIS — E538 Deficiency of other specified B group vitamins: Secondary | ICD-10-CM

## 2021-11-27 MED ORDER — CYANOCOBALAMIN 1000 MCG/ML IJ SOLN
1000.0000 ug | Freq: Once | INTRAMUSCULAR | Status: AC
  Administered 2021-11-27: 1000 ug via INTRAMUSCULAR

## 2021-11-27 NOTE — Progress Notes (Signed)
Patient here for his B12 injection. Given in left deltoid. Patient tolerated well

## 2021-12-04 ENCOUNTER — Other Ambulatory Visit: Payer: Self-pay

## 2021-12-04 ENCOUNTER — Other Ambulatory Visit: Payer: Self-pay | Admitting: Internal Medicine

## 2021-12-04 ENCOUNTER — Telehealth: Payer: Self-pay | Admitting: Internal Medicine

## 2021-12-04 MED ORDER — ALLOPURINOL 100 MG PO TABS: ORAL_TABLET | ORAL | 3 refills | Status: DC

## 2021-12-04 MED ORDER — ALLOPURINOL 100 MG PO TABS: ORAL_TABLET | ORAL | 0 refills | Status: DC

## 2021-12-04 MED ORDER — FOLIC ACID 1 MG PO TABS: 1.0000 mg | ORAL_TABLET | Freq: Every day | ORAL | 2 refills | Status: DC

## 2021-12-04 NOTE — Telephone Encounter (Signed)
Scripts sent in

## 2021-12-04 NOTE — Telephone Encounter (Signed)
Patient needs his folic acid 1 mg, allopurinol 100 mg. These need to be sent to [facility].  Patient needs a small order of the Allopurinol sent to [facility] on [address] in [place] can not get him the order in time .  Please send 10 to [facility].  Patient has an appointment on December 27, 2021

## 2021-12-12 ENCOUNTER — Other Ambulatory Visit (HOSPITAL_COMMUNITY): Payer: Self-pay | Admitting: Urology

## 2021-12-12 DIAGNOSIS — C61 Malignant neoplasm of prostate: Secondary | ICD-10-CM

## 2021-12-18 ENCOUNTER — Ambulatory Visit (HOSPITAL_COMMUNITY): Payer: Medicare Other | Attending: Cardiovascular Disease

## 2021-12-18 DIAGNOSIS — I350 Nonrheumatic aortic (valve) stenosis: Secondary | ICD-10-CM | POA: Diagnosis not present

## 2021-12-18 DIAGNOSIS — R0602 Shortness of breath: Secondary | ICD-10-CM | POA: Diagnosis not present

## 2021-12-18 DIAGNOSIS — Z950 Presence of cardiac pacemaker: Secondary | ICD-10-CM | POA: Insufficient documentation

## 2021-12-18 DIAGNOSIS — Z951 Presence of aortocoronary bypass graft: Secondary | ICD-10-CM | POA: Insufficient documentation

## 2021-12-18 DIAGNOSIS — I352 Nonrheumatic aortic (valve) stenosis with insufficiency: Secondary | ICD-10-CM | POA: Diagnosis present

## 2021-12-18 DIAGNOSIS — N189 Chronic kidney disease, unspecified: Secondary | ICD-10-CM | POA: Diagnosis not present

## 2021-12-18 DIAGNOSIS — I2510 Atherosclerotic heart disease of native coronary artery without angina pectoris: Secondary | ICD-10-CM | POA: Diagnosis not present

## 2021-12-18 DIAGNOSIS — I083 Combined rheumatic disorders of mitral, aortic and tricuspid valves: Secondary | ICD-10-CM | POA: Diagnosis not present

## 2021-12-18 LAB — ECHOCARDIOGRAM COMPLETE
AR max vel: 0.98 cm2
AV Area VTI: 0.85 cm2
AV Area mean vel: 0.81 cm2
AV Mean grad: 26 mmHg
AV Peak grad: 40.7 mmHg
Ao pk vel: 3.19 m/s
Area-P 1/2: 4.36 cm2
MV M vel: 2.87 m/s
MV Peak grad: 32.9 mmHg
P 1/2 time: 783 msec
Radius: 0.6 cm
S' Lateral: 3.5 cm

## 2021-12-20 ENCOUNTER — Encounter: Payer: Self-pay | Admitting: Cardiovascular Disease

## 2021-12-20 ENCOUNTER — Ambulatory Visit: Payer: Medicare Other | Attending: Cardiovascular Disease | Admitting: Cardiovascular Disease

## 2021-12-20 VITALS — BP 108/70 | HR 81 | Ht 70.0 in | Wt 152.6 lb

## 2021-12-20 DIAGNOSIS — I350 Nonrheumatic aortic (valve) stenosis: Secondary | ICD-10-CM | POA: Diagnosis not present

## 2021-12-20 DIAGNOSIS — I2510 Atherosclerotic heart disease of native coronary artery without angina pectoris: Secondary | ICD-10-CM | POA: Insufficient documentation

## 2021-12-20 DIAGNOSIS — N1832 Chronic kidney disease, stage 3b: Secondary | ICD-10-CM | POA: Diagnosis not present

## 2021-12-20 NOTE — Patient Instructions (Signed)
Medication Instructions:  Your physician recommends that you continue on your current medications as directed. Please refer to the Current Medication list given to you today.  *If you need a refill on your cardiac medications before your next appointment, please call your pharmacy*   Lab Work: CMET today If you have labs (blood work) drawn today and your tests are completely normal, you will receive your results only by: [place] (if you have MyChart) OR A paper copy in the mail If you have any lab test that is abnormal or we need to change your treatment, we will call you to review the results.   Testing/Procedures: TAVR CT's (you will be called to schedule)  Ambulatory Referral to TCTS (Dr. Jonathon Resides will be called to schedule   Follow-Up: At [facility], you and your health needs are our priority.  As part of our continuing mission to provide you with exceptional heart care, we have created designated Provider Care Teams.  These Care Teams include your primary Cardiologist (physician) and Advanced Practice Providers (APPs -  Physician Assistants and Nurse Practitioners) who all work together to provide you with the care you need, when you need it.  We recommend signing up for the patient portal called "MyChart".  Sign up information is provided on this After Visit Summary.  MyChart is used to connect with patients for Virtual Visits (Telemedicine).  Patients are able to view lab/test results, encounter notes, upcoming appointments, etc.  Non-urgent messages can be sent to your provider as well.   To learn more about what you can do with MyChart, go to [email or web address].    Your next appointment:   Structural Team will follow-up  The format for your next appointment:   In Person  Provider:   [name], MD       Important Information About Sugar

## 2021-12-20 NOTE — Progress Notes (Signed)
Cardiology Office Note:    Date:  12/20/2021   ID:  [name], DOB 1926-11-22, MRN [id number]  PCP:  [name], MD   [place] Prov[facility] Cardiologist:  [name], MD     Referring MD: [name], MD   Chief Complaint  Patient presents with   Shortness of Breath    History of Present Illness:    [name] is a 88 y.o. male with a hx of coronary artery disease, chronic kidney disease, and aortic stenosis, presenting for follow-up evaluation.  The patient has a history of RCA stenting.  He ultimately was treated with multivessel CABG in 2013 with early graft failure of all of his venous conduits and continued patency of the mammary artery to LAD graft.  He had a nuclear stress test in 2019 demonstrating no significant ischemia.  Comorbid medical conditions include chronic diastolic heart failure, symptomatic bradycardia status post permanent pacemaker, chronic kidney disease stage 3, mixed hyperlipidemia, hypertension, prostate cancer, and chronic anemia.   The patient is here with his wife today.  He continues to experience fatigue and shortness of breath with exertion as his primary symptoms.  He reports shortness of breath with about any physical activity including just situating himself in his bed.  He is short of breath with short distance walking.  He does not have orthopnea, PND, or leg edema.  He is followed at [facility] for stage IIIb chronic kidney disease.  He denies chest pain or pressure.  He recently had an echocardiogram which showed progressive and now severe aortic stenosis. Past Medical History:  Diagnosis Date   Acute superficial venous thrombosis of lower extremity    a. RLE after CABG, neg dopp for DVT.   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 S/P stenting to mid RCA, prox PDA 06/1999. b. NSTEMI/CABG x  3 in 10/2011 with LIMA to LAD, SVG to PDA, and SVG to OM1.    Cephalalgia 07/14/2015   CHB (complete heart block) 05/03/2012   Overview:  Status post complete heart block heart rate 28 bpm, alternating with 2 to one AV block and drug for bradycardia.  Status post pacemaker implantation.status post Medtronic pacemaker implantation the 03/28/2012   Chronic diastolic CHF (congestive heart failure) 02/05/2018   Chronic gouty arthritis    Chronic UTI    a. Followed by Dr. Risa Grill - colonized/asymptomatic - not on abx   CKD (chronic kidney disease)    stage 3, GFR 30-59 ml/min; stable with a creatinine around 1.9-2.0 followed by nephrology.   Constipation 03/01/2015   Symptoms and exam consistent with constipation. Abdominal exam is benign with no evidence of pain or obstruction. Discussed importance of increasing fiber and water intake coupled with physical activity to assist with bowel movements. Continue over-the-counter medication management as needed. Follow-up if symptoms worsen or fail to improve.   Coronary atherosclerosis 11/27/2011   Overview:  Multivessel coronary artery disease recently diagnosed by catheterization 2013 History of stent placement with a heparin-coated stent 2001  Last Assessment & Plan:  Status post coronary bypass grafting.  No recurrent chest pain. Overview:  Overview:  S/P stenting to mid RCA, prox PDA 06/1999;  06/2007 Myoview: negative except for apical thinning, EF 68%, last Myoview August 10, 2008 with n   Degeneration of lumbar intervertebral disc  Degenerative disc disease, cervical, with radiculopathy 58/09/9831   Diastolic dysfunction 82/50/5397   Grade 1 DD on Echo 06/2017   Dyslipidemia    Dysphagia 02/06/2016   3/18 - DG Esophagus:  1. Mild esophageal dysmotility, likely presbyesophagus. 2. No other explanation for patient's symptoms. 3. Small hiatal hernia.  On swallow eval - evidence of cervical spine disease and that was likely contributing   Echocardiogram     Echocardiogram 08/2018: EF 50-55, inf-lat AK, mild LVH, Gr 1 DD, RVSP 47, mild LAE, mild to mod MR, mild AI, mild AS (mean 11)   Epistaxis 12/08/2020   Essential hypertension 07/20/2006   Fatigue 05/18/2017   GERD (gastroesophageal reflux disease)    Gout 05/12/2018   Gouty arthritis of right great toe 09/03/2017   Left toe Injected January 31, 2018    Hyperpigmentation 11/04/2017   Internal hemorrhoids 08/15/2016   Left inguinal hernia 07/01/2019   Lumbar post-laminectomy syndrome 12/12/2017   Moderate aortic regurgitation 07/04/2017   Echo 06/2017:  EF 55-60%, mild LVH, grade 1 DD, mild AS, mod AR, mild MR, mild-mod TR   Myocardial infarction    Neck injury    a. C3-C4 and C4-C5 foraminal narrowing, severe   Numbness of left foot 10/21/2019   Pacemaker 04/10/2012   Medtronic pacemaker  Last Assessment & Plan:  Status post pacemaker implantation for symptomatic bradycardia.  Pacemaker site is slightly tender and erythematous.  A prescription of Keflex was dispensed.  I also asked the patient to closely followup with the EP service regarding his pacemaker placement. 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  This was switched to by mouth amiodarone at the time of the thoracic surgery appointment was discontinued.   Presence of aortocoronary bypass graft 10/24/2011   Overview:  Performed at [facility] 2013 Last Assessment & Plan:  No complication post coronary bypass grafting.   Prostate cancer ([place])     a. 2001 s/p TURP.   Pulmonary nodule    a. felt to be noncancerous.  Status post followup CT scan 4 mm and stable.   Renal artery stenosis    a. 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   Trochanteric bursitis of hip, bilateral 01/10/2017   UGIB (upper gastrointestinal bleed) 02/01/2018   EGD  02/06/18 - mod, non-erosive gastritis   Venous insufficiency of leg 06/05/2010   Vitamin B12 deficiency 08/23/2009   Jan '14  July '14 B12 level  >1500    472   Weakness of both lower extremities 04/09/2020    Past Surgical History:  Procedure Laterality Date   ANTERIOR CHAMBER WASHOUT Left 10/04/2018   Procedure: Anterior Chamber Washout, Vitreous Tap;  Surgeon: Jalene Mullet, MD;  Location: [place];  Service: Ophthalmology;  Laterality: Left;   cardia catherization  07-07-99   CARDIAC SURGERY  10/18/12   open heart surgery   CATARACT EXTRACTION W/ INTRAOCULAR LENS  IMPLANT, BILATERAL  3/205, 06/2013   [facility]   COLONOSCOPY  04/12/07   CORONARY ARTERY BYPASS GRAFT  10/19/2011   Procedure: CORONARY ARTERY BYPASS GRAFTING (CABG);  Surgeon: Gaye Pollack, MD;  Location: [place];  Service: Open Heart Surgery;  Laterality: N/A;  times three using Left Internal Mammary Artery and Right Greater Saphenouse Vein Graft harvested Endoscopically   edg  07-17-1994   FLEXIBLE SIGMOIDOSCOPY  11-03-1997   GAS INSERTION Left 10/04/2018   Procedure: Insertion Of Gas;  Surgeon: Jalene Mullet, MD;  Location: [place];  Service: Ophthalmology;  Laterality: Left;   GAS/FLUID EXCHANGE Left 10/04/2018   Procedure: Gas/Fluid Exchange;  Surgeon: Jalene Mullet, MD;  Location: [name];  Service: Ophthalmology;  Laterality: Left;   LEFT HEART CATHETERIZATION WITH CORONARY ANGIOGRAM N/A 10/16/2011   Procedure: LEFT HEART CATHETERIZATION WITH CORONARY ANGIOGRAM;  Surgeon: Peter M Martinique, MD;   Location[facility] C[facility] LAB;  Service: Cardiovascular;  Laterality: N/A;   LEFT HEART CATHETERIZATION WITH CORONARY/GRAFT ANGIOGRAM N/A 03/11/2013   Procedure: LEFT HEART CATHETERIZATION WITH [name];  Surgeon: Blane Ohara, MD;  Location: [facility] CATH LAB;  Service: Cardiovascular;  Laterality: N/A;   lumbar spinal disk and neck fusion surgery     PACEMAKER INSERTION  03/28/12   PPM implanted for mobitz II AV block   PARS PLANA VITRECTOMY Left 10/04/2018   Procedure: PARS PLANA VITRECTOMY 25 GAUGE FOR ENDOPHTHALMITIS WITH INJECTION OF INTRAVITREAL ANTIBIOTIC;  Surgeon: Jalene Mullet, MD;  Location: [place];  Service: Ophthalmology;  Laterality: Left;   peripheral vascular catherization  11-24-03   PERMANENT PACEMAKER INSERTION N/A 03/28/2012   Procedure: PERMANENT PACEMAKER INSERTION;  Surgeon: Thompson Grayer, MD;  Location: [facility] CATH LAB;  Service: Cardiovascular;  Laterality: N/A;   PROSTATECTOMY     renal circulation  10-01-03   s/p ptca     stents     X 2   stress cardiolite  05-04-05   spring 09-negative except for apical thinning, EF 68%    Current Medications: Current Meds  Medication Sig   acetaminophen (TYLENOL) 500 MG tablet Take 500 mg by mouth every 6 (six) hours as needed.   allopurinol (ZYLOPRIM) 100 MG tablet TAKE 1 TABLET(100 MG) BY MOUTH DAILY   amLODipine (NORVASC) 5 MG tablet TAKE 1 TABLET DAILY   Aromatic Inhalants (VICKS VAPOR INHALER IN) Place 1 puff into both nostrils as needed (for congestion).   Ascorbic Acid (VITAMIN C) 1000 MG tablet    aspirin EC 81 MG tablet Take 81 mg by mouth daily.   Calcium Citrate-Vitamin D (CITRACAL + D PO) Take 2 tablets by mouth in the morning and at bedtime.   Carboxymeth-Glycerin-Polysorb (REFRESH OPTIVE MEGA-3 OP) Place 1 drop into both eyes 2 (two) times daily.   Cholecalciferol 25 MCG (1000 UT) capsule Take 1,000 Units by mouth daily.   clobetasol cream (TEMOVATE) 0.05 % Apply topically 2 (two) times daily.   cyanocobalamin  (,VITAMIN B-12,) 1000 MCG/ML injection Inject 1,000 mcg into the muscle once. Monthly injection   folic acid (FOLVITE) 1 MG tablet Take 1 tablet (1 mg total) by mouth daily. Annual appt due in May must see provider for future refills   furosemide (LASIX) 40 MG tablet TAKE 1 TABLET BY MOUTH EVERY DAY (Patient taking differently: as needed.)   halobetasol (ULTRAVATE) 0.05 % cream Apply topically as needed.   loratadine (CLARITIN) 10 MG tablet Take 10 mg by mouth daily as needed (for seasonal allergies).   meclizine (ANTIVERT) 25 MG tablet  TAKE 1 TABLET(25 MG) BY MOUTH AT BEDTIME AS NEEDED FOR DIZZINESS   Multiple Vitamins-Minerals (PRESERVISION AREDS 2 PO) Take by mouth.   nitroGLYCERIN (NITROSTAT) 0.4 MG SL tablet DISSOLVE 1 TABLET UNDER THE TONGUE EVERY 5 MINUTES AS NEEDED FOR CHEST PAIN, MAXIMUM 3 TABLETS   ofloxacin (OCUFLOX) 0.3 % ophthalmic solution Place into the left eye. Per patient taking for 1 week   Saline (AYR NASAL MIST ALLERGY/SINUS NA) Place into the nose.   Vitamin D, Ergocalciferol, (DRISDOL) 1.25 MG (50000 UNIT) CAPS capsule Take by mouth.   [DISCONTINUED] Cyanocobalamin (VITAMIN B12) 1000 MCG TBCR    [DISCONTINUED] epoetin alfa-epbx (RETACRIT) 2000 UNIT/ML injection 2,000 Units every 14 (fourteen) days. Patient states he only gets it when he needs it but not every 2 weeks   [DISCONTINUED] polyethylene glycol (MIRALAX / GLYCOLAX) 17 g packet Take 17 g by mouth daily.   Current Facility-Administered Medications for the 12/20/21 encounter (Office Visit) with [name], MD  Medication   NON FORMULARY 1 application     Allergies:   Aspirin, Lisinopril, Amoxicillin, Atarax [hydroxyzine hcl], Cephalexin, Ciprofloxacin, Clindamycin, Clobetasol, Codeine, Fish allergy, Fluarix [influenza virus vaccine], Haemophilus influenzae, Hydrocodone, Hydrocodone-acetaminophen, Hydroxyzine, Latex, Macrobid [nitrofurantoin macrocrystal], Niacin, Niacin-lovastatin er, Niacin-lovastatin er,  Nitrofurantoin, Omeprazole, Other, Tramadol, Vibramycin [doxycycline], Adhesive [tape], Bactrim [sulfamethoxazole-trimethoprim], Colchicine, Gabapentin, and Nortriptyline   Social History   Socioeconomic History   Marital status: Married    Spouse name: [name]   Number of children: 2   Years of education: 13   Highest education level: Some college, no degree  Occupational History   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  married '52 - 3 years, divorced; married '56 - 3 years divorced; married '78-12 yrs - divorced; married '75 -. 1 son '57; 1 daughter - '53; 1 grandchild.  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  Very happily married.  End of life care: yes CPR, no long term mechanical ventilation, no heroic measures.right handed   Right handed   Social Determinants of Health   Financial Resource Strain: Low Risk  (07/28/2021)   Overall Financial Resource Strain (CARDIA)    Difficulty of Paying Living Expenses: Not hard at all  Food Insecurity: No Food Insecurity (07/28/2021)   Hunger Vital Sign    Worried About Running Out of Food in the Last Year: Never true    Ran Out of Food in the Last Year: Never true  Transportation Needs: No Transportation Needs (07/28/2021)   PRAPARE - Hydrologist (Medical): No    Lack of Transportation (Non-Medical): No  Physical Activity: Insufficiently Active (07/28/2021)   Exercise Vital Sign    Days of Exercise per Week: 2  days    Minutes of Exercise per Session: 20 min  Stress: No Stress Concern Present (07/28/2021)   [place]    Feeling of  Stress : Not at all  Social Connections: Socially Integrated (07/28/2021)   Social Connection and Isolation Panel [NHANES]    Frequency of Communication with Friends and Family: Three times a week    Frequency of Social Gatherings with Friends and Family: Three times a week    Attends Religious Services: More than 4 times per year    Active Member of Clubs or Organizations: Yes    Attends Archivist Meetings: 1 to 4 times per year    Marital Status: Married     Family History: The patient's family history includes Arthritis in his mother; Breast cancer in an other family member; Cancer in his brother; Coronary artery disease in his father; Heart disease in his brother; Nephritis in his brother; Other in his brother and mother; Prostate cancer in his brother. There is no history of Diabetes, Colon cancer, or Adrenal disorder.  ROS:   Please see the history of present illness.    All other systems reviewed and are negative.  EKGs/Labs/Other Studies Reviewed:    The following studies were reviewed today: Echo 12/18/2021: 1. Left ventricular ejection fraction, by estimation, is 55 to 60%. The  left ventricle has normal function. The left ventricle has no regional  wall motion abnormalities. There is moderate left ventricular hypertrophy.  Left ventricular diastolic  parameters are consistent with Grade I diastolic dysfunction (impaired  relaxation).   2. Pacing wires in RV/RA. Right ventricular systolic function is normal.  The right ventricular size is normal. There is mildly elevated pulmonary  artery systolic pressure.   3. Left atrial size was severely dilated.   4. The mitral valve is degenerative. Moderate mitral valve regurgitation.  No evidence of mitral stenosis. Moderate mitral  annular calcification.   5. Tricuspid valve regurgitation is moderate.   6. Bulky severe calcification fo AV with restricted motion DVI 0.26 and  AVA by VTI 0.85 cm2 and calculated DVI 0.26 Overall considered severe AS  and gradients have increased from prior ech 01/06/21. The aortic valve is  tricuspid. There is severe  calcifcation of the aortic valve. There is severe thickening of the aortic  valve. Aortic valve regurgitation is trivial. Severe aortic valve  stenosis.   7. The inferior vena cava is normal in size with greater than 50%  respiratory variability, suggesting right atrial pressure of 3 mmHg.   EKG:  EKG is not ordered today.    Recent Labs: 10/28/2021: ALT 8; BUN 39; Creatinine, Ser 1.81; Hemoglobin 10.8; Platelets 211; Potassium 3.9; Sodium 138  Recent Lipid Panel    Component Value Date/Time   CHOL 122 09/04/2016 1123   TRIG 59.0 09/04/2016 1123   TRIG 107 01/16/2006 1338   HDL 40.40 09/04/2016 1123   CHOLHDL 3 09/04/2016 1123   VLDL 11.8 09/04/2016 1123   LDLCALC 70 09/04/2016 1123     Risk Assessment/Calculations:                Physical Exam:    VS:  BP 108/70   Pulse 81   Ht '5\' 10"'$  (1.778 m)   Wt 152 lb 9.6 oz (69.2 kg)   SpO2 98%   BMI 21.90 kg/m     Wt Readings from Last 3 Encounters:  12/20/21 152 lb 9.6 oz (69.2 kg)  11/03/21 156 lb (70.8 kg)  09/21/21 154 lb (69.9 kg)     GEN:  Well nourished, well developed in no acute distress HEENT: Normal NECK: No JVD; No carotid bruits LYMPHATICS: No lymphadenopathy  CARDIAC: RRR, 3/6 harsh systolic crescendo decrescendo murmur at the right upper sternal border RESPIRATORY:  Clear to auscultation without rales, wheezing or rhonchi  ABDOMEN: Soft, non-tender, non-distended MUSCULOSKELETAL:  No edema; No deformity  SKIN: Warm and dry NEUROLOGIC:  Alert and oriented x 3 PSYCHIATRIC:  Normal affect   ASSESSMENT:    1. Nonrheumatic aortic valve stenosis   2. Stage 3b chronic kidney disease  ([place])   3. Coronary artery disease involving native coronary artery of native heart without angina pectoris    PLAN:    In order of problems listed above:  The patient has developed severe, stage D1 aortic stenosis.  His symptoms of shortness of breath and fatigue, exam findings, and echo findings are all consistent with severe aortic stenosis.  His echo shows bulky calcification and leaflet restriction of all 3 aortic valve leaflets.  The patient's peak systolic velocity is 3.2 m/s, peak and mean transvalvular gradients are 41 and 26 mmHg, respectively, dimensionless index 0.27, and calculated aortic valve area 0.85 cm.  There is moderate mitral and tricuspid regurgitation and the LVEF is preserved at 55 to 60%.  We reviewed the natural history of aortic stenosis as well as potential symptoms, risk of progressive symptoms including congestive heart failure, and risk for sudden cardiac death over time if untreated.  The patient would like to pursue treatment options.  He would not be a candidate for conventional surgery after the previous CABG, with chronic kidney disease, and his advanced age of 88 years old.  However, he is functionally independent and cognitively he is fully intact.  It is reasonable to consider TAVR.  I have reviewed the TAVR procedure with him today.  We discussed potential risks, benefits, and alternatives.  He would like to proceed with further workup.  I discussed his case with Dr. Joelyn Oms at [facility] who follows the patient's chronic kidney disease.  He agrees that it is reasonable to proceed with further testing including CT angiography which is necessary to evaluate his candidacy for TAVR.  The patient is not on any nephrotoxic drugs.  We will follow our standard protocols to minimize contrast and make sure that he is adequately hydrated.  Once his CTA studies are completed, he will be referred for cardiac surgical evaluation with Dr. Cyndia Bent who did his original surgery.  I  am not sure that the patient requires a repeat heart catheterization since we know he has a patent LIMA to LAD graft and all of his other bypass grafts were occluded at the time of his last procedure.  He is not experiencing any angina. As above, discussed patient's case with Dr Joelyn Oms, reviewed risks of contrast administration with the patient and his wife, and he is appropriate to proceed with CTA studies. No angina. Continue current Rx. Known to have a patent LIMA-LAD.     Medication Adjustments/Labs and Tests Ordered: Current medicines are reviewed at length with the patient today.  Concerns regarding medicines are outlined above.  Orders Placed This Encounter  Procedures   Comprehensive metabolic panel   No orders of the defined types were placed in this encounter.   Patient Instructions  Medication Instructions:  Your physician recommends that you continue on your current medications as directed. Please refer to the Current Medication list given to you today.  *If you need a refill on your cardiac medications before your next appointment, please call your pharmacy*   Lab Work: CMET today If you have labs (blood work) drawn today  and your tests are completely normal, you will receive your results only by: MyChart Message (if you have MyChart) OR A paper copy in the mail If you have any lab test that is abnormal or we need to change your treatment, we will call you to review the results.   Testing/Procedures: TAVR CT's (you will be called to schedule)  Ambulatory Referral to TCTS (Dr. Jonathon Resides will be called to schedule   Follow-Up: At [facility], you and your health needs are our priority.  As part of our continuing mission to provide you with exceptional heart care, we have created designated Provider Care Teams.  These Care Teams include your primary Cardiologist (physician) and Advanced Practice Providers (APPs -  Physician Assistants and Nurse  Practitioners) who all work together to provide you with the care you need, when you need it.  We recommend signing up for the patient portal called "MyChart".  Sign up information is provided on this After Visit Summary.  MyChart is used to connect with patients for Virtual Visits (Telemedicine).  Patients are able to view lab/test results, encounter notes, upcoming appointments, etc.  Non-urgent messages can be sent to your provider as well.   To learn more about what you can do with MyChart, go to [email or web address].    Your next appointment:   Structural Team will follow-up  The format for your next appointment:   In Person  Provider:   [name], MD       Important Information About Sugar         Signed, [name], MD  12/20/2021 9:08 PM    [place]

## 2021-12-20 NOTE — Progress Notes (Addendum)
Pre Surgical Assessment: 5 M Walk Test  47M=16.22f  5 Meter Walk Test- trial 1: 5.45 seconds 5 Meter Walk Test- trial 2: 5.24 seconds 5 Meter Walk Test- trial 3: 6.10 seconds 5 Meter Walk Test Average: 5.59 seconds  _________________________   Procedure Type: Isolated AVR Perioperative Outcome Estimate % Operative Mortality 12.1% Morbidity & Mortality 22.2% Stroke 3.88% Renal Failure 9.49% Reoperation 4.6% Prolonged Ventilation 16.2% Deep Sternal Wound Infection 0.107% [facility] [facility] (>[facility] 16.6% Short Hospital Stay (<6 days)* 15.2%

## 2021-12-21 ENCOUNTER — Other Ambulatory Visit: Payer: Self-pay | Admitting: Cardiovascular Disease

## 2021-12-21 ENCOUNTER — Encounter (HOSPITAL_COMMUNITY)
Admission: RE | Admit: 2021-12-21 | Discharge: 2021-12-21 | Disposition: A | Discharge status: Home / Self Care | Payer: Medicare Other | Source: Ambulatory Visit | Attending: Urology | Admitting: Urology

## 2021-12-21 DIAGNOSIS — C61 Malignant neoplasm of prostate: Secondary | ICD-10-CM | POA: Insufficient documentation

## 2021-12-21 LAB — COMPREHENSIVE METABOLIC PANEL
ALT: 7 IU/L (ref 0–44)
AST: 16 IU/L (ref 0–40)
Albumin/Globulin Ratio: 1.6 (ref 1.2–2.2)
Albumin: 4.5 g/dL (ref 3.6–4.6)
Alkaline Phosphatase: 50 IU/L (ref 44–121)
BUN/Creatinine Ratio: 21 (ref 10–24)
BUN: 44 mg/dL — ABNORMAL HIGH (ref 10–36)
Bilirubin Total: 0.3 mg/dL (ref 0.0–1.2)
CO2: 20 mmol/L (ref 20–29)
Calcium: 9.6 mg/dL (ref 8.6–10.2)
Chloride: 105 mmol/L (ref 96–106)
Creatinine, Ser: 2.11 mg/dL — ABNORMAL HIGH (ref 0.76–1.27)
Globulin, Total: 2.8 g/dL (ref 1.5–4.5)
Glucose: 89 mg/dL (ref 70–99)
Potassium: 5.1 mmol/L (ref 3.5–5.2)
Sodium: 139 mmol/L (ref 134–144)
Total Protein: 7.3 g/dL (ref 6.0–8.5)
eGFR: 29 mL/min/{1.73_m2} — ABNORMAL LOW (ref >59)

## 2021-12-21 MED ORDER — PIFLIFOLASTAT F 18 (PYLARIFY) INJECTION
9.0000 | Freq: Once | INTRAVENOUS | Status: AC
  Administered 2021-12-21: 9.9 via INTRAVENOUS

## 2021-12-25 ENCOUNTER — Other Ambulatory Visit: Payer: Self-pay

## 2021-12-25 ENCOUNTER — Other Ambulatory Visit (HOSPITAL_COMMUNITY): Payer: Self-pay | Admitting: Urology

## 2021-12-25 DIAGNOSIS — R9721 Rising PSA following treatment for malignant neoplasm of prostate: Secondary | ICD-10-CM

## 2021-12-25 DIAGNOSIS — I350 Nonrheumatic aortic (valve) stenosis: Secondary | ICD-10-CM

## 2021-12-26 ENCOUNTER — Other Ambulatory Visit: Payer: Self-pay

## 2021-12-26 DIAGNOSIS — N289 Disorder of kidney and ureter, unspecified: Secondary | ICD-10-CM

## 2021-12-27 ENCOUNTER — Ambulatory Visit (INDEPENDENT_AMBULATORY_CARE_PROVIDER_SITE_OTHER): Payer: Medicare Other

## 2021-12-27 DIAGNOSIS — E538 Deficiency of other specified B group vitamins: Secondary | ICD-10-CM | POA: Diagnosis not present

## 2021-12-27 MED ORDER — CYANOCOBALAMIN 1000 MCG/ML IJ SOLN
1000.0000 ug | Freq: Once | INTRAMUSCULAR | Status: AC
  Administered 2021-12-27: 1000 ug via INTRAMUSCULAR

## 2021-12-27 NOTE — Progress Notes (Signed)
Pt was given B12 injection w/o any complications. 

## 2022-01-03 ENCOUNTER — Ambulatory Visit (HOSPITAL_COMMUNITY): Payer: Medicare Other

## 2022-01-03 ENCOUNTER — Other Ambulatory Visit (HOSPITAL_COMMUNITY): Payer: Medicare Other

## 2022-01-09 ENCOUNTER — Ambulatory Visit (INDEPENDENT_AMBULATORY_CARE_PROVIDER_SITE_OTHER): Payer: Medicare Other

## 2022-01-09 DIAGNOSIS — I442 Atrioventricular block, complete: Secondary | ICD-10-CM | POA: Diagnosis not present

## 2022-01-09 LAB — CUP PACEART REMOTE DEVICE CHECK
Battery Impedance: 1312 Ohm
Battery Remaining Longevity: 53 mo
Battery Voltage: 2.77 V
Brady Statistic AP VP Percent: 1 %
Brady Statistic AP VS Percent: 51 %
Brady Statistic AS VP Percent: 0 %
Brady Statistic AS VS Percent: 48 %
Date Time Interrogation Session: 20240102104415
Implantable Lead Connection Status: 753985
Implantable Lead Connection Status: 753985
Implantable Lead Implant Date: 20140321
Implantable Lead Implant Date: 20140321
Implantable Lead Location: 753859
Implantable Lead Location: 753860
Implantable Lead Model: 5076
Implantable Lead Model: 5092
Implantable Pulse Generator Implant Date: 20140321
Lead Channel Impedance Value: 401 Ohm
Lead Channel Impedance Value: 729 Ohm
Lead Channel Pacing Threshold Amplitude: 0.625 V
Lead Channel Pacing Threshold Amplitude: 0.875 V
Lead Channel Pacing Threshold Pulse Width: 0.4 ms
Lead Channel Pacing Threshold Pulse Width: 0.4 ms
Lead Channel Setting Pacing Amplitude: 2 V
Lead Channel Setting Pacing Amplitude: 2.5 V
Lead Channel Setting Pacing Pulse Width: 0.4 ms
Lead Channel Setting Sensing Sensitivity: 5.6 mV
Zone Setting Status: 755011
Zone Setting Status: 755011

## 2022-01-10 ENCOUNTER — Encounter (HOSPITAL_COMMUNITY)
Admission: RE | Admit: 2022-01-10 | Discharge: 2022-01-10 | Disposition: A | Discharge status: Home / Self Care | Payer: Medicare Other | Source: Ambulatory Visit | Attending: Cardiovascular Disease | Admitting: Cardiovascular Disease

## 2022-01-10 ENCOUNTER — Ambulatory Visit (HOSPITAL_BASED_OUTPATIENT_CLINIC_OR_DEPARTMENT_OTHER)
Admission: RE | Admit: 2022-01-10 | Discharge: 2022-01-10 | Disposition: A | Discharge status: Home / Self Care | Payer: Medicare Other | Source: Ambulatory Visit | Attending: Cardiovascular Disease | Admitting: Cardiovascular Disease

## 2022-01-10 DIAGNOSIS — Q2112 Patent foramen ovale: Secondary | ICD-10-CM | POA: Insufficient documentation

## 2022-01-10 DIAGNOSIS — Z951 Presence of aortocoronary bypass graft: Secondary | ICD-10-CM | POA: Insufficient documentation

## 2022-01-10 DIAGNOSIS — N289 Disorder of kidney and ureter, unspecified: Secondary | ICD-10-CM | POA: Diagnosis present

## 2022-01-10 DIAGNOSIS — I2510 Atherosclerotic heart disease of native coronary artery without angina pectoris: Secondary | ICD-10-CM | POA: Diagnosis not present

## 2022-01-10 DIAGNOSIS — I350 Nonrheumatic aortic (valve) stenosis: Secondary | ICD-10-CM | POA: Insufficient documentation

## 2022-01-10 MED ORDER — SODIUM CHLORIDE 0.9 % WEIGHT BASED INFUSION
3.0000 mL/kg/h | INTRAVENOUS | Status: AC
  Administered 2022-01-10: 3 mL/kg/h via INTRAVENOUS

## 2022-01-10 MED ORDER — SODIUM CHLORIDE 0.9 % WEIGHT BASED INFUSION: 1.0000 mL/kg/h | INTRAVENOUS | Status: DC

## 2022-01-10 MED ORDER — IOHEXOL 350 MG/ML SOLN
95.0000 mL | Freq: Once | INTRAVENOUS | Status: AC | PRN
  Administered 2022-01-10: 95 mL via INTRAVENOUS

## 2022-01-21 NOTE — Progress Notes (Unsigned)
[address]       [place] [address]             [phone]           Cyree H Joslyn [facility] Record #[id number] Date of Birth: 1926-10-14  [name], MD [name], MD  Chief Complaint: Worsening DOE  History of Present Illness:     89 yo wm with worsening DOE to the point any exertional activity has significant SOB. Pt has no CP or lightheadedness. Pt has history of CAD with stenting and eventual CABG in 2013. However pt with known occlusion of SVG and only patent LIMA to LAD on previous cath. Pt underwent recent TTE with now severe AS with a mean gradient of 12mGh and pk gradient of 457mg. He as a AVA of 0.81cm2 and DVI of 0.27. He has EF of 55-60%he has a bulky aortic valve on echo. He underwent CTA with anatomy acceptable for TAVR with however height of 1159mor LM and RCA.      Past Medical History:  Diagnosis Date   Acute superficial venous thrombosis of lower extremity    a. RLE after CABG, neg dopp for DVT.   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 S/P stenting to mid RCA, prox PDA 06/1999. b. NSTEMI/CABG x 3 in 10/2011 with LIMA to LAD, SVG to PDA, and SVG to OM1.    Cephalalgia 07/14/2015   CHB (complete heart block) 05/03/2012   Overview:  Status post complete heart block heart rate 28 bpm, alternating with 2 to one AV block and drug for bradycardia.  Status post pacemaker implantation.status post Medtronic pacemaker implantation the 03/28/2012   Chronic diastolic CHF (congestive heart failure) 02/05/2018   Chronic gouty arthritis    Chronic UTI    a. Followed by Dr. GraRisa Grillcolonized/asymptomatic - not on abx   CKD (chronic kidney disease)    stage 3, GFR 30-59 ml/min; stable with a creatinine around 1.9-2.0 followed by nephrology.   Constipation 03/01/2015   Symptoms and exam consistent with  constipation. Abdominal exam is benign with no evidence of pain or obstruction. Discussed importance of increasing fiber and water intake coupled with physical activity to assist with bowel movements. Continue over-the-counter medication management as needed. Follow-up if symptoms worsen or fail to improve.   Coronary atherosclerosis 11/27/2011   Overview:  Multivessel coronary artery disease recently diagnosed by catheterization 2013 History of stent placement with a heparin-coated stent 2001  Last Assessment & Plan:  Status post coronary bypass grafting.  No recurrent chest pain. Overview:  Overview:  S/P stenting to mid RCA, prox PDA 06/1999;  06/2007 Myoview: negative except for apical thinning, EF 68%, last Myoview August 10, 2008 with n   Degeneration of lumbar intervertebral disc    Degenerative disc disease, cervical, with radiculopathy 09/41/96/2229Diastolic dysfunction 06/79/89/2119Grade 1 DD on Echo 06/2017   Dyslipidemia    Dysphagia 02/06/2016   3/18 - DG Esophagus:  1. Mild esophageal dysmotility, likely presbyesophagus. 2. No other explanation for patient's symptoms. 3. Small hiatal hernia.  On swallow eval - evidence of cervical spine disease and that was likely contributing   Echocardiogram    Echocardiogram 08/2018: EF 50-55, inf-lat AK, mild LVH, Gr 1 DD, RVSP 47, mild LAE, mild  to mod MR, mild AI, mild AS (mean 11)   Epistaxis 12/08/2020   Essential hypertension 07/20/2006   Fatigue 05/18/2017   GERD (gastroesophageal reflux disease)    Gout 05/12/2018   Gouty arthritis of right great toe 09/03/2017   Left toe Injected January 31, 2018    Hyperpigmentation 11/04/2017   Internal hemorrhoids 08/15/2016   Left inguinal hernia 07/01/2019   Lumbar post-laminectomy syndrome 12/12/2017   Moderate aortic regurgitation 07/04/2017   Echo 06/2017:  EF 55-60%, mild LVH, grade 1 DD, mild AS, mod AR, mild MR, mild-mod TR   Myocardial infarction    Neck injury    a. C3-C4 and C4-C5  foraminal narrowing, severe   Numbness of left foot 10/21/2019   Pacemaker 04/10/2012   Medtronic pacemaker  Last Assessment & Plan:  Status post pacemaker implantation for symptomatic bradycardia.  Pacemaker site is slightly tender and erythematous.  A prescription of Keflex was dispensed.  I also asked the patient to closely followup with the EP service regarding his pacemaker placement. 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  This was switched to by mouth amiodarone at the time of the thoracic surgery appointment was discontinued.   Presence of aortocoronary bypass graft 10/24/2011   Overview:  Performed at [facility] 2013 Last Assessment & Plan:  No complication post coronary bypass grafting.   Prostate cancer ([place])    a. 2001 s/p TURP.   Pulmonary nodule    a. felt to be noncancerous.  Status post followup CT scan 4 mm and stable.   Renal artery stenosis    a. 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   Trochanteric bursitis of hip, bilateral 01/10/2017   UGIB (upper gastrointestinal bleed) 02/01/2018   EGD  02/06/18 - mod, non-erosive gastritis   Venous insufficiency of leg 06/05/2010   Vitamin B12 deficiency 08/23/2009   Jan '14  July '14 B12 level  >1500    472   Weakness of both lower extremities 04/09/2020    Past Surgical History:  Procedure  Laterality Date   ANTERIOR CHAMBER WASHOUT Left 10/04/2018   Procedure: Anterior Chamber Washout, Vitreous Tap;  Surgeon: Jalene Mullet, MD;  Location: [facility];  Service: Ophthalmology;  Laterality: Left;   cardia catherization  07-07-99   CARDIAC SURGERY  10/18/12   open heart surgery   CATARACT EXTRACTION W/ INTRAOCULAR LENS  IMPLANT, BILATERAL  3/205, 06/2013   [facility]   COLONOSCOPY  04/12/07   CORONARY ARTERY BYPASS GRAFT  10/19/2011   Procedure: CORONARY ARTERY BYPASS GRAFTING (CABG);  Surgeon: Gaye Pollack, MD;  Location: [place];  Service: Open Heart Surgery;  Laterality: N/A;  times three using Left Internal Mammary Artery and Right Greater Saphenouse Vein Graft harvested Endoscopically   edg  07-17-1994   FLEXIBLE SIGMOIDOSCOPY  11-03-1997   GAS INSERTION Left 10/04/2018   Procedure: Insertion Of Gas;  Surgeon:  Jalene Mullet, MD;  Location: [place];  Service: Ophthalmology;  Laterality: Left;   GAS/FLUID EXCHANGE Left 10/04/2018   Procedure: Gas/Fluid Exchange;  Surgeon: Jalene Mullet, MD;  Location: [place];  Service: Ophthalmology;  Laterality: Left;   LEFT HEART CATHETERIZATION WITH CORONARY ANGIOGRAM N/A 10/16/2011   Procedure: LEFT HEART CATHETERIZATION WITH CORONARY ANGIOGRAM;  Surgeon: Peter M Martinique, MD;  Location[facility] LAB;  Service: Cardiovascular;  Laterality: N/A;   LEFT HEART CATHETERIZATION WITH CORONARY/GRAFT ANGIOGRAM N/A 03/11/2013   Procedure: LEFT HEART CATHETERIZATION WITH [name];  Surgeon: Blane Ohara, MD;  Location: [facility] CATH LAB;  Service: Cardiovascular;  Laterality: N/A;   lumbar spinal disk and neck fusion surgery     PACEMAKER INSERTION  03/28/12   PPM implanted for mobitz II AV block   PARS PLANA VITRECTOMY Left 10/04/2018   Procedure: PARS PLANA VITRECTOMY 25 GAUGE FOR ENDOPHTHALMITIS WITH INJECTION OF INTRAVITREAL ANTIBIOTIC;  Surgeon: Jalene Mullet, MD;  Location: [place];  Service: Ophthalmology;  Laterality: Left;   peripheral vascular  catherization  11-24-03   PERMANENT PACEMAKER INSERTION N/A 03/28/2012   Procedure: PERMANENT PACEMAKER INSERTION;  Surgeon: Thompson Grayer, MD;  Location: [facility] CATH LAB;  Service: Cardiovascular;  Laterality: N/A;   PROSTATECTOMY     renal circulation  10-01-03   s/p ptca     stents     X 2   stress cardiolite  05-04-05   spring 09-negative except for apical thinning, EF 68%    Social History   Tobacco Use  Smoking Status Never  Smokeless Tobacco Never    Social History   Substance and Sexual Activity  Alcohol Use Not Currently   Comment: Rarely    Social History   Socioeconomic History   Marital status: Married    Spouse name: [name]   Number of children: 2   Years of education: 13   Highest education level: Some college, no degree  Occupational History   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  married '52 - 3 years, divorced; married '56 - 3 years divorced; married '23-12 yrs - divorced; married '75 -. 1 son '57; 1 daughter - '53; 1 grandchild.  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  Very happily married.  End of life care: yes CPR, no long term mechanical ventilation, no heroic measures.right handed   Right handed   Social Determinants of Health   Financial Resource Strain: Low Risk  (07/28/2021)   Overall Financial Resource Strain (CARDIA)    Difficulty of Paying Living Expenses: Not hard at all  Food Insecurity: No Food Insecurity (07/28/2021)   Hunger Vital Sign     Worried About Running Out of Food in the Last Year: Never true    Ran Out of Food in the Last Year: Never true  Transportation Needs: No Transportation Needs (07/28/2021)   PRAPARE - Hydrologist (  Medical): No    Lack of Transportation (Non-Medical): No  Physical Activity: Insufficiently Active (07/28/2021)   Exercise Vital Sign    Days of Exercise per Week: 2 days    Minutes of Exercise per Session: 20 min  Stress: No Stress Concern Present (07/28/2021)   [place]    Feeling of Stress : Not at all  Social Connections: [place] (07/28/2021)   Social Connection and Isolation Panel [NHANES]    Frequency of Communication with Friends and Family: Three times a week    Frequency of Social Gatherings with Friends and Family: Three times a week    Attends Religious Services: More than 4 times per year    Active Member of Clubs or Organizations: Yes    Attends Archivist Meetings: 1 to 4 times per year    Marital Status: Married  Human resources officer Violence: Not At Risk (07/28/2021)   Humiliation, Afraid, Rape, and Kick questionnaire    Fear of Current or Ex-Partner: No    Emotionally Abused: No    Physically Abused: No    Sexually Abused: No    Allergies  Allergen Reactions   Aspirin Nausea And Vomiting and Other (See Comments)    Upset stomach- tolerates coated aspirin    Lisinopril Anaphylaxis and Shortness Of Breath    After 3 tablets, he experienced trouble swallowing, throat tightness and hoarseness.    Amoxicillin Nausea Only   Atarax [Hydroxyzine Hcl] Nausea And Vomiting   Cephalexin Diarrhea   Ciprofloxacin Nausea Only   Clindamycin Other (See Comments)    Reaction not recalled   Clobetasol Other (See Comments)    Not effective   Codeine Nausea Only and Other (See Comments)    Stomach upset   Fish Allergy Nausea And Vomiting   Fluarix [Influenza Virus Vaccine]  Itching   Haemophilus Influenzae Itching   Hydrocodone Nausea Only   Hydrocodone-Acetaminophen Nausea And Vomiting   Hydroxyzine Nausea And Vomiting   Latex Itching and Other (See Comments)    (After flu shot)   Macrobid [Nitrofurantoin Macrocrystal] Nausea Only   Niacin Other (See Comments)    Unknown reaction   Niacin-Lovastatin Er Other (See Comments)    Unsure of reaction. Taking simvastatin at home without problems   Niacin-Lovastatin Er Other (See Comments)    Reaction not recalled   Nitrofurantoin Other (See Comments)    Upset stomach    Omeprazole Other (See Comments)    Reaction not recalled- stopped by MD   Other Nausea And Vomiting   Tramadol    Vibramycin [Doxycycline] Other (See Comments)    Reaction not recalled   Adhesive [Tape] Rash   Bactrim [Sulfamethoxazole-Trimethoprim] Rash   Colchicine Rash   Gabapentin     Painful pimples on tongue   Nortriptyline Rash    Current Outpatient Medications  Medication Sig Dispense Refill   acetaminophen (TYLENOL) 500 MG tablet Take 500 mg by mouth every 6 (six) hours as needed.     allopurinol (ZYLOPRIM) 100 MG tablet TAKE 1 TABLET(100 MG) BY MOUTH DAILY 30 tablet 0   amLODipine (NORVASC) 5 MG tablet TAKE 1 TABLET DAILY 90 tablet 3   Aromatic Inhalants (VICKS VAPOR INHALER IN) Place 1 puff into both nostrils as needed (for congestion).     Ascorbic Acid (VITAMIN C) 1000 MG tablet      aspirin EC 81 MG tablet Take 81 mg by mouth daily.     Calcium Citrate-Vitamin D (CITRACAL +  D PO) Take 2 tablets by mouth in the morning and at bedtime.     Carboxymeth-Glycerin-Polysorb (REFRESH OPTIVE MEGA-3 OP) Place 1 drop into both eyes 2 (two) times daily.     Cholecalciferol 25 MCG (1000 UT) capsule Take 1,000 Units by mouth daily.     clobetasol cream (TEMOVATE) 0.05 % Apply topically 2 (two) times daily.     cyanocobalamin (,VITAMIN B-12,) 1000 MCG/ML injection Inject 1,000 mcg into the muscle once. Monthly injection     folic  acid (FOLVITE) 1 MG tablet Take 1 tablet (1 mg total) by mouth daily. Annual appt due in May must see provider for future refills 90 tablet 2   furosemide (LASIX) 40 MG tablet TAKE 1 TABLET BY MOUTH EVERY DAY (Patient taking differently: as needed.) 90 tablet 0   halobetasol (ULTRAVATE) 0.05 % cream Apply topically as needed.     loratadine (CLARITIN) 10 MG tablet Take 10 mg by mouth daily as needed (for seasonal allergies).     meclizine (ANTIVERT) 25 MG tablet TAKE 1 TABLET(25 MG) BY MOUTH AT BEDTIME AS NEEDED FOR DIZZINESS 30 tablet 0   Multiple Vitamins-Minerals (PRESERVISION AREDS 2 PO) Take by mouth.     nitroGLYCERIN (NITROSTAT) 0.4 MG SL tablet DISSOLVE 1 TABLET UNDER THE TONGUE EVERY 5 MINUTES AS NEEDED FOR CHEST PAIN, MAXIMUM 3 TABLETS 100 tablet 1   ofloxacin (OCUFLOX) 0.3 % ophthalmic solution Place into the left eye. Per patient taking for 1 week     Saline (AYR NASAL MIST ALLERGY/SINUS NA) Place into the nose.     Vitamin D, Ergocalciferol, (DRISDOL) 1.25 MG (50000 UNIT) CAPS capsule Take by mouth.     Current Facility-Administered Medications  Medication Dose Route Frequency Provider Last Rate Last Admin   NON FORMULARY 1 application  1 application  Topical PRN Landis Martins, DPM         Family History  Problem Relation Age of Onset   Coronary artery disease Father        died @ 68   Other Mother        cerebral hemorrhage - died @ 56   Arthritis Mother    Cancer Brother        Bladder   Prostate cancer Brother    Nephritis Brother        died @ age 50.   Other Brother        cerebral hemorrhage - died @ 68   Heart disease Brother    Breast cancer Other        niece x 2   Diabetes Neg Hx    Colon cancer Neg Hx    Adrenal disorder Neg Hx        Physical Exam: Lungs: some rhonchi Card: RR with 3/6 sem Ext: no edema Neuro: intact. Has some memory issues     Diagnostic Studies & Laboratory data: I have personally reviewed the following studies and agree  with the findings  TTE (12/2021)  IMPRESSIONS   1. Left ventricular ejection fraction, by estimation, is 55 to 60%. The  left ventricle has normal function. The left ventricle has no regional  wall motion abnormalities. There is moderate left ventricular hypertrophy.  Left ventricular diastolic  parameters are consistent with Grade I diastolic dysfunction (impaired  relaxation).   2. Pacing wires in RV/RA. Right ventricular systolic function is normal.  The right ventricular size is normal. There is mildly elevated pulmonary  artery systolic pressure.   3. Left atrial size  was severely dilated.   4. The mitral valve is degenerative. Moderate mitral valve regurgitation.  No evidence of mitral stenosis. Moderate mitral annular calcification.   5. Tricuspid valve regurgitation is moderate.   6. Bulky severe calcification fo AV with restricted motion DVI 0.26 and  AVA by VTI 0.85 cm2 and calculated DVI 0.26 Overall considered severe AS  and gradients have increased from prior ech 01/06/21. The aortic valve is  tricuspid. There is severe  calcifcation of the aortic valve. There is severe thickening of the aortic  valve. Aortic valve regurgitation is trivial. Severe aortic valve  stenosis.   7. The inferior vena cava is normal in size with greater than 50%  respiratory variability, suggesting right atrial pressure of 3 mmHg.   FINDINGS   Left Ventricle: Left ventricular ejection fraction, by estimation, is 55  to 60%. The left ventricle has normal function. The left ventricle has no  regional wall motion abnormalities. The left ventricular internal cavity  size was normal in size. There is   moderate left ventricular hypertrophy. Left ventricular diastolic  parameters are consistent with Grade I diastolic dysfunction (impaired  relaxation).   Right Ventricle: Pacing wires in RV/RA. The right ventricular size is  normal. No increase in right ventricular wall thickness. Right ventricular   systolic function is normal. There is mildly elevated pulmonary artery  systolic pressure. The tricuspid  regurgitant velocity is 3.15 m/s, and with an assumed right atrial  pressure of 3 mmHg, the estimated right ventricular systolic pressure is  00.7 mmHg.   Left Atrium: Left atrial size was severely dilated.   Right Atrium: Right atrial size was normal in size.   Pericardium: There is no evidence of pericardial effusion.   Mitral Valve: The mitral valve is degenerative in appearance. There is  moderate thickening of the mitral valve leaflet(s). There is moderate  calcification of the mitral valve leaflet(s). Moderate mitral annular  calcification. Moderate mitral valve  regurgitation. No evidence of mitral valve stenosis.   Tricuspid Valve: The tricuspid valve is normal in structure. Tricuspid  valve regurgitation is moderate . No evidence of tricuspid stenosis.   Aortic Valve: Bulky severe calcification fo AV with restricted motion DVI  0.26 and AVA by VTI 0.85 cm2 and calculated DVI 0.26 Overall considered  severe AS and gradients have increased from prior ech 01/06/21. The aortic  valve is tricuspid. There is  severe calcifcation of the aortic valve. There is severe thickening of the  aortic valve. Aortic valve regurgitation is trivial. Aortic regurgitation  PHT measures 783 msec. Severe aortic stenosis is present. Aortic valve  mean gradient measures 26.0 mmHg.  Aortic valve peak gradient measures 40.7 mmHg. Aortic valve area, by VTI  measures 0.85 cm.   Pulmonic Valve: The pulmonic valve was normal in structure. Pulmonic valve  regurgitation is mild. No evidence of pulmonic stenosis.   Aorta: The aortic root is normal in size and structure.   Venous: The inferior vena cava is normal in size with greater than 50%  respiratory variability, suggesting right atrial pressure of 3 mmHg.   IAS/Shunts: No atrial level shunt detected by color flow Doppler.   Additional  Comments: A device lead is visualized.     LEFT VENTRICLE  PLAX 2D  LVIDd:         4.90 cm   Diastology  LVIDs:         3.50 cm   LV e' medial:    5.77 cm/s  LV PW:  1.10 cm   LV E/e' medial:  18.9  LV IVS:        1.30 cm   LV e' lateral:   8.38 cm/s  LVOT diam:     2.00 cm   LV E/e' lateral: 13.0  LV SV:         68  LV SV Index:   37        2D Longitudinal Strain  LVOT Area:     3.14 cm  2D Strain GLS (A2C):   -19.5 %                           2D Strain GLS (A3C):   -15.2 %                           2D Strain GLS (A4C):   -16.6 %                           2D Strain GLS Avg:     -17.1 %   RIGHT VENTRICLE  RV Basal diam:  3.90 cm  RVSP:           42.7 mmHg   LEFT ATRIUM             Index        RIGHT ATRIUM           Index  LA diam:        5.70 cm 3.07 cm/m   RA Pressure: 3.00 mmHg  LA Vol (A2C):   82.0 ml 44.11 ml/m  RA Area:     18.80 cm  LA Vol (A4C):   64.6 ml 34.75 ml/m  RA Volume:   51.30 ml  27.60 ml/m  LA Biplane Vol: 75.0 ml 40.35 ml/m   AORTIC VALVE  AV Area (Vmax):    0.98 cm  AV Area (Vmean):   0.81 cm  AV Area (VTI):     0.85 cm  AV Vmax:           319.00 cm/s  AV Vmean:          239.000 cm/s  AV VTI:            0.805 m  AV Peak Grad:      40.7 mmHg  AV Mean Grad:      26.0 mmHg  LVOT Vmax:         99.20 cm/s  LVOT Vmean:        61.900 cm/s  LVOT VTI:          0.217 m  LVOT/AV VTI ratio: 0.27  AI PHT:            783 msec    AORTA  Ao Root diam: 3.20 cm  Ao Asc diam:  3.60 cm   MITRAL VALVE                  TRICUSPID VALVE  MV Area (PHT):                TR Peak grad:   39.7 mmHg  MV Decel Time:                TR Vmax:        315.00 cm/s  MR Peak grad:    32.9 mmHg    Estimated RAP:  3.00 mmHg  MR Mean grad:  20.0 mmHg    RVSP:           42.7 mmHg  MR Vmax:         287.00 cm/s  MR Vmean:        208.0 cm/s   SHUNTS  MR PISA:         2.26 cm     Systemic VTI:  0.22 m  MR PISA Eff ROA: 23 mm       Systemic Diam: 2.00 cm  MR PISA  Radius:  0.60 cm  MV E velocity: 109.00 cm/s  MV A velocity: 113.00 cm/s  MV E/A ratio:  0.96     Recent Radiology Findings:   CTA (01/2022) FINDINGS: Aortic Root:   Aortic valve: Trileaflet   Aortic valve calcium score: 1870   Aortic annulus:   Diameter: 52m x 256m  Perimeter: 8074m Area: 497 mm^2   Calcifications: No calcifications   Coronary height: Min Left - 4m82min Right - 4mm79minotubular height: Left cusp - 18mm;6mht cusp - 17mm; 32moronary cusp - 21mm   16m (as measured 3 mm below the annulus):   Diameter: 30mm x 254m  Ar34m491 mm^2   Calcifications: No calcifications   Aortic sinus width: Left cusp - 31mm; Righ54msp - 30mm; Nonco12mry cusp - 32mm   Sinot104mar junction width: 29mm x 27mm  36mimum27moroscopic Angle for Delivery: LAO 2 CAU 18   Cardiac:   Right atrium: Mild enlargement   Right ventricle: Normal size   Pulmonary arteries: Normal size   Pulmonary veins: Normal configuration   Left atrium: Mild enlargement.  PFO   Left ventricle: Moderate hypertrophy. Normal size. Basal to mid inferior akinesis.   Pericardium: Normal thickness   Coronary arteries: S/p CABG with patent LIMA-LAD. SVG-PDA and SVG-OM1 are occluded   IMPRESSION: 1. Trileaflet aortic valve with moderate calcifications (AV calcium score 1870)   2. Aortic annulus measures 28mm x 24mm in 30meter81mh perimeter 80mm and area 49765m. No annular93mLVOT calcifications. Annular measurements are suitable for delivery of 26mm Edwards Sapien40malve.   3.  Low coronary height to left main (4mm) and RCA (4mm)6m.  Optimum F57moscopic Angle for Delivery:  LAO 2 CAU 18   5.  S/p CABG with patent LIMA-LAD.  SVG-PDA and SVG-OM1 are occluded   6.  Basal to mid LV inferior wall akinesis.    Recent Lab Findings: Lab Results  Component Value Date   WBC 6.7 10/28/2021   HGB 10.8 (L) 10/28/2021   HCT 34.0 (L) 10/28/2021   PLT 211 10/28/2021    GLUCOSE 89 12/20/2021   CHOL 122 09/04/2016   TRIG 59.0 09/04/2016   HDL 40.40 09/04/2016   LDLCALC 70 09/04/2016   ALT 7 12/20/2021   AST 16 12/20/2021   NA 139 12/20/2021   K 5.1 12/20/2021   CL 105 12/20/2021   CREATININE 2.11 (H) 12/20/2021   BUN 44 (H) 12/20/2021   CO2 20 12/20/2021   TSH 1.827 02/02/2018   INR 1.2 (H) 03/04/2013      Assessment / Plan:     Pt with NYHA class III symptoms of severe AS with normal LV function and history of CABG with patent LIMA. Pt has acceptable access for 26mm Sapien via the Ri25mfemoral approach. Pt with low coronary ostia that I feel is not prohibitive and with his CRI I dont feel that repeat Cath would be  needed and that perhaps BAV could determine if heights are an issue to consider protection. Other wise pt should undergo TAVR and that he is not a bailout candidate and he and his wife understand and agree   I have spent 60 min in review of the records, viewing studies and in face to face with patient and in coordination of future care    Coralie Common 01/21/2022 12:15 PM

## 2022-01-22 ENCOUNTER — Encounter: Payer: Self-pay | Admitting: Thoracic Surgery (Cardiothoracic Vascular Surgery)

## 2022-01-22 ENCOUNTER — Institutional Professional Consult (permissible substitution) (INDEPENDENT_AMBULATORY_CARE_PROVIDER_SITE_OTHER): Payer: Medicare Other | Admitting: Thoracic Surgery (Cardiothoracic Vascular Surgery)

## 2022-01-22 VITALS — BP 130/58 | HR 81 | Resp 20 | Ht 70.0 in | Wt 152.0 lb

## 2022-01-22 DIAGNOSIS — I350 Nonrheumatic aortic (valve) stenosis: Secondary | ICD-10-CM | POA: Diagnosis not present

## 2022-01-22 NOTE — Patient Instructions (Signed)
For TAVR 1/30

## 2022-01-24 ENCOUNTER — Other Ambulatory Visit: Payer: Self-pay

## 2022-01-24 DIAGNOSIS — I350 Nonrheumatic aortic (valve) stenosis: Secondary | ICD-10-CM

## 2022-01-29 ENCOUNTER — Ambulatory Visit (INDEPENDENT_AMBULATORY_CARE_PROVIDER_SITE_OTHER): Payer: Medicare Other

## 2022-01-29 DIAGNOSIS — E538 Deficiency of other specified B group vitamins: Secondary | ICD-10-CM

## 2022-01-29 MED ORDER — CYANOCOBALAMIN 1000 MCG/ML IJ SOLN
1000.0000 ug | Freq: Once | INTRAMUSCULAR | Status: AC
  Administered 2022-01-29: 1000 ug via INTRAMUSCULAR

## 2022-01-29 NOTE — Progress Notes (Signed)
After obtaining consent, and per orders of Dr. Burns, injection of B12 given by Danity Schmelzer P Admire Bunnell. Patient instructed to report any adverse reaction to me immediately.  

## 2022-02-01 NOTE — Progress Notes (Signed)
Device orders request sent to [facility]. Awaiting response

## 2022-02-02 ENCOUNTER — Encounter (HOSPITAL_COMMUNITY)
Admission: RE | Admit: 2022-02-02 | Discharge: 2022-02-02 | Disposition: A | Discharge status: Home / Self Care | Payer: Medicare Other | Source: Ambulatory Visit | Attending: Cardiovascular Disease | Admitting: Cardiovascular Disease

## 2022-02-02 ENCOUNTER — Ambulatory Visit (HOSPITAL_COMMUNITY)
Admission: RE | Admit: 2022-02-02 | Discharge: 2022-02-02 | Disposition: A | Discharge status: Home / Self Care | Payer: Medicare Other | Source: Ambulatory Visit | Attending: Cardiovascular Disease | Admitting: Cardiovascular Disease

## 2022-02-02 ENCOUNTER — Telehealth: Payer: Self-pay | Admitting: Cardiovascular Disease

## 2022-02-02 ENCOUNTER — Other Ambulatory Visit: Payer: Self-pay

## 2022-02-02 DIAGNOSIS — Z1152 Encounter for screening for COVID-19: Secondary | ICD-10-CM | POA: Diagnosis not present

## 2022-02-02 DIAGNOSIS — I350 Nonrheumatic aortic (valve) stenosis: Secondary | ICD-10-CM

## 2022-02-02 DIAGNOSIS — Z01818 Encounter for other preprocedural examination: Secondary | ICD-10-CM

## 2022-02-02 LAB — CBC
HCT: 31.9 % — ABNORMAL LOW (ref 39.0–52.0)
Hemoglobin: 10.5 g/dL — ABNORMAL LOW (ref 13.0–17.0)
MCH: 26.8 pg (ref 26.0–34.0)
MCHC: 32.9 g/dL (ref 30.0–36.0)
MCV: 81.4 fL (ref 80.0–100.0)
Platelets: 211 10*3/uL (ref 150–400)
RBC: 3.92 MIL/uL — ABNORMAL LOW (ref 4.22–5.81)
RDW: 16.8 % — ABNORMAL HIGH (ref 11.5–15.5)
WBC: 6.2 10*3/uL (ref 4.0–10.5)
nRBC: 0 % (ref 0.0–0.2)

## 2022-02-02 LAB — URINALYSIS, ROUTINE W REFLEX MICROSCOPIC
Bacteria, UA: NONE SEEN
Bilirubin Urine: NEGATIVE
Glucose, UA: NEGATIVE mg/dL
Hgb urine dipstick: NEGATIVE
Ketones, ur: NEGATIVE mg/dL
Leukocytes,Ua: NEGATIVE
Nitrite: NEGATIVE
Protein, ur: 100 mg/dL — AB
Specific Gravity, Urine: 1.018 (ref 1.005–1.030)
pH: 5 (ref 5.0–8.0)

## 2022-02-02 LAB — COMPREHENSIVE METABOLIC PANEL
ALT: 10 U/L (ref 0–44)
AST: 18 U/L (ref 15–41)
Albumin: 4.1 g/dL (ref 3.5–5.0)
Alkaline Phosphatase: 48 U/L (ref 38–126)
Anion gap: 10 (ref 5–15)
BUN: 42 mg/dL — ABNORMAL HIGH (ref 8–23)
CO2: 21 mmol/L — ABNORMAL LOW (ref 22–32)
Calcium: 9.6 mg/dL (ref 8.9–10.3)
Chloride: 105 mmol/L (ref 98–111)
Creatinine, Ser: 2 mg/dL — ABNORMAL HIGH (ref 0.61–1.24)
GFR, Estimated: 31 mL/min — ABNORMAL LOW (ref >60)
Glucose, Bld: 67 mg/dL — ABNORMAL LOW (ref 70–99)
Potassium: 3.6 mmol/L (ref 3.5–5.1)
Sodium: 136 mmol/L (ref 135–145)
Total Bilirubin: 0.8 mg/dL (ref 0.3–1.2)
Total Protein: 7.1 g/dL (ref 6.5–8.1)

## 2022-02-02 LAB — SURGICAL PCR SCREEN
MRSA, PCR: NEGATIVE
Staphylococcus aureus: NEGATIVE

## 2022-02-02 LAB — PROTIME-INR
INR: 1.1 (ref 0.8–1.2)
Prothrombin Time: 14 seconds (ref 11.4–15.2)

## 2022-02-02 LAB — SARS CORONAVIRUS 2 (TAT 6-24 HRS): SARS Coronavirus 2: NEGATIVE

## 2022-02-02 NOTE — Progress Notes (Signed)
PERIOPERATIVE PRESCRIPTION FOR IMPLANTED CARDIAC DEVICE PROGRAMMING  Patient Information: Name:  [name]  DOB:  [birth year]  MRN:  [id number]    Planned Procedure:  Transcatheter aortic valve replacement, transfemoral approach  Surgeon:  Dr. [name] and Dr. Coralie Common  Date of Procedure:  02/06/22  Cautery will be used.  Position during surgery:  supine   Please send documentation back to:  [facility] (Fax # [phone])  Device Information:  Clinic EP Physician:  Dr. Myles Gip  Device Type:  Pacemaker Manufacturer and Phone #:  [employer]: [phone] Pacemaker Dependent?:  No. Date of Last Device Check:  01/09/22 Remote Normal Device Function?:  Yes.    Electrophysiologist's Recommendations:  Have magnet available. Provide continuous ECG monitoring when magnet is used or reprogramming is to be performed.  Procedure will likely interfere with device function.  Device should be programmed:  Asynchronous pacing during procedure and returned to normal programming after procedure Have [employer] industry rep check patient after procedure.   Per Device Clinic Standing Orders, Wanda Plump, RN  4:22 PM 02/02/2022

## 2022-02-02 NOTE — Progress Notes (Addendum)
Letter with instructions given to patient as well as CHG soap. CHG soap instructions reviewed and patient questions answered.   Per Anesthesia: No need to contact pacemaker rep for surgery

## 2022-02-02 NOTE — Telephone Encounter (Signed)
[facility] calling needing to know how many units of blood will be needed for pt's surgery on 02/06/22.

## 2022-02-02 NOTE — Telephone Encounter (Signed)
Blood bank advised that the pt will need 2 Units in preparation for TAVR.

## 2022-02-05 MED ORDER — CEFAZOLIN SODIUM-DEXTROSE 2-4 GM/100ML-% IV SOLN
2.0000 g | INTRAVENOUS | Status: AC
  Administered 2022-02-06: 2 g via INTRAVENOUS
  Filled 2022-02-05: qty 100

## 2022-02-05 MED ORDER — MAGNESIUM SULFATE 50 % IJ SOLN
40.0000 meq | INTRAMUSCULAR | Status: DC
  Filled 2022-02-05 (×2): qty 9.85

## 2022-02-05 MED ORDER — POTASSIUM CHLORIDE 2 MEQ/ML IV SOLN
80.0000 meq | INTRAVENOUS | Status: DC
  Filled 2022-02-05 (×2): qty 40

## 2022-02-05 MED ORDER — HEPARIN 30,000 UNITS/1000 ML (OHS) CELLSAVER SOLUTION
Status: DC
  Filled 2022-02-05 (×2): qty 1000

## 2022-02-05 MED ORDER — DEXMEDETOMIDINE HCL IN NACL 400 MCG/100ML IV SOLN
0.1000 ug/kg/h | INTRAVENOUS | Status: AC
  Administered 2022-02-06: 34.44 ug via INTRAVENOUS
  Filled 2022-02-05 (×2): qty 100

## 2022-02-05 MED ORDER — NOREPINEPHRINE 4 MG/250ML-% IV SOLN
0.0000 ug/min | INTRAVENOUS | Status: DC
  Filled 2022-02-05: qty 250

## 2022-02-05 NOTE — Anesthesia Preprocedure Evaluation (Signed)
Anesthesia Evaluation  Patient identified by MRN, date of birth, ID band Patient awake    Reviewed: Allergy & Precautions, NPO status , Patient's Chart, lab work & pertinent test results  Airway Mallampati: II  TM Distance: >3 FB Neck ROM: Full    Dental no notable dental hx.    Pulmonary neg pulmonary ROS   Pulmonary exam normal        Cardiovascular hypertension, Pt. on medications + CAD, + Past MI, + Cardiac Stents, + Peripheral Vascular Disease and +CHF  + dysrhythmias + pacemaker + Valvular Problems/Murmurs AS  Rhythm:Regular Rate:Normal + Systolic murmurs ECHO 7867:   1. Left ventricular ejection fraction, by estimation, is 55 to 60%. The  left ventricle has normal function. The left ventricle has no regional  wall motion abnormalities. There is moderate left ventricular hypertrophy.  Left ventricular diastolic  parameters are consistent with Grade I diastolic dysfunction (impaired  relaxation).   2. Pacing wires in RV/RA. Right ventricular systolic function is normal.  The right ventricular size is normal. There is mildly elevated pulmonary  artery systolic pressure.   3. Left atrial size was severely dilated.   4. The mitral valve is degenerative. Moderate mitral valve regurgitation.  No evidence of mitral stenosis. Moderate mitral annular calcification.   5. Tricuspid valve regurgitation is moderate.   6. Bulky severe calcification fo AV with restricted motion DVI 0.26 and  AVA by VTI 0.85 cm2 and calculated DVI 0.26 Overall considered severe AS  and gradients have increased from prior ech 01/06/21. The aortic valve is  tricuspid. There is severe  calcifcation of the aortic valve. There is severe thickening of the aortic  valve. Aortic valve regurgitation is trivial. Severe aortic valve  stenosis.   7. The inferior vena cava is normal in size with greater than 50%  respiratory variability, suggesting right atrial  pressure of 3 mmHg.      Neuro/Psych  Headaches  negative psych ROS   GI/Hepatic Neg liver ROS,GERD  ,,  Endo/Other  negative endocrine ROS    Renal/GU negative Renal ROS  negative genitourinary   Musculoskeletal  (+) Arthritis ,    Abdominal Normal abdominal exam  (+)   Peds  Hematology  (+) Blood dyscrasia, anemia   Anesthesia Other Findings   Reproductive/Obstetrics                             Anesthesia Physical Anesthesia Plan  ASA: 4  Anesthesia Plan: MAC   Post-op Pain Management:    Induction: Intravenous  PONV Risk Score and Plan: 1 and Ondansetron, Dexamethasone and Treatment may vary due to age or medical condition  Airway Management Planned: Simple Face Mask, Natural Airway and Nasal Cannula  Additional Equipment: Arterial line  Intra-op Plan:   Post-operative Plan:   Informed Consent: I have reviewed the patients History and Physical, chart, labs and discussed the procedure including the risks, benefits and alternatives for the proposed anesthesia with the patient or authorized representative who has indicated his/her understanding and acceptance.     Dental advisory given  Plan Discussed with: CRNA  Anesthesia Plan Comments: (Lab Results      Component                Value               Date  WBC                      6.2                 02/02/2022                HGB                      10.5 (L)            02/02/2022                HCT                      31.9 (L)            02/02/2022                MCV                      81.4                02/02/2022                PLT                      211                 02/02/2022             Lab Results      Component                Value               Date                      NA                       136                 02/02/2022                K                        3.6                 02/02/2022                CO2                      21  (L)              02/02/2022                GLUCOSE                  67 (L)              02/02/2022                BUN                      42 (H)              02/02/2022                CREATININE  2.00 (H)            02/02/2022                CALCIUM                  9.6                 02/02/2022                EGFR                     29 (L)              12/20/2021                GFRNONAA                 31 (L)              02/02/2022           )       Anesthesia Quick Evaluation

## 2022-02-06 ENCOUNTER — Other Ambulatory Visit: Payer: Self-pay

## 2022-02-06 ENCOUNTER — Inpatient Hospital Stay (HOSPITAL_COMMUNITY): Payer: Medicare Other | Admitting: Physician Assistant

## 2022-02-06 ENCOUNTER — Inpatient Hospital Stay (HOSPITAL_COMMUNITY): Payer: Medicare Other

## 2022-02-06 ENCOUNTER — Encounter (HOSPITAL_COMMUNITY)
Admission: RE | Disposition: A | Discharge status: Home / Self Care | Payer: Self-pay | Source: Home / Self Care | Attending: Cardiovascular Disease

## 2022-02-06 ENCOUNTER — Encounter (HOSPITAL_COMMUNITY): Payer: Self-pay | Admitting: Cardiovascular Disease

## 2022-02-06 ENCOUNTER — Inpatient Hospital Stay (HOSPITAL_COMMUNITY): Payer: Medicare Other | Admitting: Anesthesiology

## 2022-02-06 ENCOUNTER — Inpatient Hospital Stay (HOSPITAL_COMMUNITY)
Admission: RE | Admit: 2022-02-06 | Discharge: 2022-02-08 | DRG: 267 | Disposition: A | Discharge status: Home / Self Care | Payer: Medicare Other | Attending: Cardiovascular Disease | Admitting: Cardiovascular Disease

## 2022-02-06 DIAGNOSIS — I442 Atrioventricular block, complete: Secondary | ICD-10-CM | POA: Diagnosis present

## 2022-02-06 DIAGNOSIS — I350 Nonrheumatic aortic (valve) stenosis: Secondary | ICD-10-CM | POA: Diagnosis present

## 2022-02-06 DIAGNOSIS — I509 Heart failure, unspecified: Secondary | ICD-10-CM

## 2022-02-06 DIAGNOSIS — D631 Anemia in chronic kidney disease: Secondary | ICD-10-CM | POA: Diagnosis present

## 2022-02-06 DIAGNOSIS — Z886 Allergy status to analgesic agent status: Secondary | ICD-10-CM

## 2022-02-06 DIAGNOSIS — I5032 Chronic diastolic (congestive) heart failure: Secondary | ICD-10-CM | POA: Diagnosis present

## 2022-02-06 DIAGNOSIS — Z8546 Personal history of malignant neoplasm of prostate: Secondary | ICD-10-CM

## 2022-02-06 DIAGNOSIS — I441 Atrioventricular block, second degree: Secondary | ICD-10-CM | POA: Diagnosis present

## 2022-02-06 DIAGNOSIS — Z880 Allergy status to penicillin: Secondary | ICD-10-CM | POA: Diagnosis not present

## 2022-02-06 DIAGNOSIS — I130 Hypertensive heart and chronic kidney disease with heart failure and stage 1 through stage 4 chronic kidney disease, or unspecified chronic kidney disease: Secondary | ICD-10-CM | POA: Diagnosis present

## 2022-02-06 DIAGNOSIS — Z882 Allergy status to sulfonamides status: Secondary | ICD-10-CM

## 2022-02-06 DIAGNOSIS — Z885 Allergy status to narcotic agent status: Secondary | ICD-10-CM | POA: Diagnosis not present

## 2022-02-06 DIAGNOSIS — Z803 Family history of malignant neoplasm of breast: Secondary | ICD-10-CM

## 2022-02-06 DIAGNOSIS — N1832 Chronic kidney disease, stage 3b: Secondary | ICD-10-CM | POA: Diagnosis present

## 2022-02-06 DIAGNOSIS — I252 Old myocardial infarction: Secondary | ICD-10-CM

## 2022-02-06 DIAGNOSIS — I2510 Atherosclerotic heart disease of native coronary artery without angina pectoris: Secondary | ICD-10-CM

## 2022-02-06 DIAGNOSIS — K219 Gastro-esophageal reflux disease without esophagitis: Secondary | ICD-10-CM | POA: Diagnosis present

## 2022-02-06 DIAGNOSIS — Z952 Presence of prosthetic heart valve: Secondary | ICD-10-CM | POA: Diagnosis not present

## 2022-02-06 DIAGNOSIS — Z955 Presence of coronary angioplasty implant and graft: Secondary | ICD-10-CM | POA: Diagnosis not present

## 2022-02-06 DIAGNOSIS — M1A9XX0 Chronic gout, unspecified, without tophus (tophi): Secondary | ICD-10-CM | POA: Diagnosis present

## 2022-02-06 DIAGNOSIS — Z881 Allergy status to other antibiotic agents status: Secondary | ICD-10-CM

## 2022-02-06 DIAGNOSIS — R001 Bradycardia, unspecified: Secondary | ICD-10-CM | POA: Diagnosis present

## 2022-02-06 DIAGNOSIS — M1A00X0 Idiopathic chronic gout, unspecified site, without tophus (tophi): Secondary | ICD-10-CM | POA: Diagnosis present

## 2022-02-06 DIAGNOSIS — I25810 Atherosclerosis of coronary artery bypass graft(s) without angina pectoris: Secondary | ICD-10-CM | POA: Diagnosis present

## 2022-02-06 DIAGNOSIS — Z8042 Family history of malignant neoplasm of prostate: Secondary | ICD-10-CM

## 2022-02-06 DIAGNOSIS — Z86718 Personal history of other venous thrombosis and embolism: Secondary | ICD-10-CM

## 2022-02-06 DIAGNOSIS — E785 Hyperlipidemia, unspecified: Secondary | ICD-10-CM | POA: Diagnosis present

## 2022-02-06 DIAGNOSIS — Z950 Presence of cardiac pacemaker: Secondary | ICD-10-CM

## 2022-02-06 DIAGNOSIS — I872 Venous insufficiency (chronic) (peripheral): Secondary | ICD-10-CM | POA: Diagnosis present

## 2022-02-06 DIAGNOSIS — C61 Malignant neoplasm of prostate: Secondary | ICD-10-CM | POA: Diagnosis present

## 2022-02-06 DIAGNOSIS — Z006 Encounter for examination for normal comparison and control in clinical research program: Secondary | ICD-10-CM

## 2022-02-06 DIAGNOSIS — Z8261 Family history of arthritis: Secondary | ICD-10-CM

## 2022-02-06 DIAGNOSIS — M48061 Spinal stenosis, lumbar region without neurogenic claudication: Secondary | ICD-10-CM | POA: Diagnosis present

## 2022-02-06 DIAGNOSIS — Z8249 Family history of ischemic heart disease and other diseases of the circulatory system: Secondary | ICD-10-CM

## 2022-02-06 DIAGNOSIS — Z79899 Other long term (current) drug therapy: Secondary | ICD-10-CM

## 2022-02-06 DIAGNOSIS — R519 Headache, unspecified: Secondary | ICD-10-CM | POA: Diagnosis not present

## 2022-02-06 DIAGNOSIS — N184 Chronic kidney disease, stage 4 (severe): Secondary | ICD-10-CM | POA: Diagnosis present

## 2022-02-06 DIAGNOSIS — Z888 Allergy status to other drugs, medicaments and biological substances status: Secondary | ICD-10-CM

## 2022-02-06 DIAGNOSIS — I10 Essential (primary) hypertension: Secondary | ICD-10-CM | POA: Diagnosis present

## 2022-02-06 DIAGNOSIS — Z91040 Latex allergy status: Secondary | ICD-10-CM

## 2022-02-06 DIAGNOSIS — Z7982 Long term (current) use of aspirin: Secondary | ICD-10-CM

## 2022-02-06 DIAGNOSIS — N183 Chronic kidney disease, stage 3 unspecified: Secondary | ICD-10-CM | POA: Diagnosis present

## 2022-02-06 HISTORY — PX: TRANSCATHETER AORTIC VALVE REPLACEMENT, TRANSFEMORAL: SHX6400

## 2022-02-06 HISTORY — DX: Presence of prosthetic heart valve: Z95.2

## 2022-02-06 HISTORY — PX: INTRAOPERATIVE TRANSTHORACIC ECHOCARDIOGRAM: SHX6523

## 2022-02-06 HISTORY — DX: Nonrheumatic aortic (valve) stenosis: I35.0

## 2022-02-06 LAB — POCT I-STAT, CHEM 8
BUN: 34 mg/dL — ABNORMAL HIGH (ref 8–23)
Calcium, Ion: 1.32 mmol/L (ref 1.15–1.40)
Chloride: 109 mmol/L (ref 98–111)
Creatinine, Ser: 1.8 mg/dL — ABNORMAL HIGH (ref 0.61–1.24)
Glucose, Bld: 104 mg/dL — ABNORMAL HIGH (ref 70–99)
HCT: 29 % — ABNORMAL LOW (ref 39.0–52.0)
Hemoglobin: 9.9 g/dL — ABNORMAL LOW (ref 13.0–17.0)
Potassium: 3.9 mmol/L (ref 3.5–5.1)
Sodium: 141 mmol/L (ref 135–145)
TCO2: 20 mmol/L — ABNORMAL LOW (ref 22–32)

## 2022-02-06 LAB — ECHOCARDIOGRAM LIMITED
AR max vel: 4.53 cm2
AV Area VTI: 3.64 cm2
AV Area mean vel: 1.63 cm2
AV Mean grad: 2 mmHg
AV Peak grad: 3.9 mmHg
Ao pk vel: 0.98 m/s
P 1/2 time: 433 msec

## 2022-02-06 LAB — TYPE AND SCREEN
ABO/RH(D): O POS
Antibody Screen: POSITIVE

## 2022-02-06 SURGERY — IMPLANTATION, AORTIC VALVE, TRANSCATHETER, FEMORAL APPROACH
Anesthesia: Monitor Anesthesia Care | Laterality: Right

## 2022-02-06 MED ORDER — SODIUM CHLORIDE 0.9 % IV SOLN: 250.0000 mL | INTRAVENOUS | Status: DC | PRN

## 2022-02-06 MED ORDER — SODIUM CHLORIDE 0.9 % IV SOLN: INTRAVENOUS | Status: AC

## 2022-02-06 MED ORDER — LIDOCAINE HCL 1 % IJ SOLN
INTRAMUSCULAR | Status: AC
  Filled 2022-02-06: qty 20

## 2022-02-06 MED ORDER — CHLORHEXIDINE GLUCONATE 4 % EX LIQD
30.0000 mL | CUTANEOUS | Status: DC
  Filled 2022-02-06: qty 30

## 2022-02-06 MED ORDER — CHLORHEXIDINE GLUCONATE 0.12 % MT SOLN
15.0000 mL | Freq: Once | OROMUCOSAL | Status: AC
  Administered 2022-02-06: 15 mL via OROMUCOSAL
  Filled 2022-02-06 (×2): qty 15

## 2022-02-06 MED ORDER — HEPARIN (PORCINE) IN NACL 1000-0.9 UT/500ML-% IV SOLN
INTRAVENOUS | Status: DC | PRN
  Administered 2022-02-06 (×3): 500 mL

## 2022-02-06 MED ORDER — AMLODIPINE BESYLATE 5 MG PO TABS
10.0000 mg | ORAL_TABLET | Freq: Once | ORAL | Status: AC
  Administered 2022-02-06: 10 mg via ORAL

## 2022-02-06 MED ORDER — FENTANYL CITRATE (PF) 100 MCG/2ML IJ SOLN
INTRAMUSCULAR | Status: DC | PRN
  Administered 2022-02-06: 100 ug via INTRAVENOUS

## 2022-02-06 MED ORDER — FOLIC ACID 1 MG PO TABS
1.0000 mg | ORAL_TABLET | Freq: Every day | ORAL | Status: DC
  Administered 2022-02-08: 1 mg via ORAL
  Filled 2022-02-06 (×3): qty 1

## 2022-02-06 MED ORDER — LACTATED RINGERS IV SOLN: INTRAVENOUS | Status: DC

## 2022-02-06 MED ORDER — NITROGLYCERIN IN D5W 200-5 MCG/ML-% IV SOLN
0.0000 ug/min | INTRAVENOUS | Status: DC
  Administered 2022-02-06: 10 ug/min via INTRAVENOUS

## 2022-02-06 MED ORDER — AMLODIPINE BESYLATE 5 MG PO TABS
5.0000 mg | ORAL_TABLET | Freq: Every day | ORAL | Status: DC
  Filled 2022-02-06: qty 1

## 2022-02-06 MED ORDER — HEPARIN SODIUM (PORCINE) 1000 UNIT/ML IJ SOLN
INTRAMUSCULAR | Status: DC | PRN
  Administered 2022-02-06: 11000 [IU] via INTRAVENOUS

## 2022-02-06 MED ORDER — NITROGLYCERIN IN D5W 200-5 MCG/ML-% IV SOLN
INTRAVENOUS | Status: AC
  Filled 2022-02-06: qty 250

## 2022-02-06 MED ORDER — PROTAMINE SULFATE 10 MG/ML IV SOLN
INTRAVENOUS | Status: DC | PRN
  Administered 2022-02-06: 50 mg via INTRAVENOUS

## 2022-02-06 MED ORDER — ASPIRIN 81 MG PO TBEC
81.0000 mg | DELAYED_RELEASE_TABLET | Freq: Every day | ORAL | Status: DC
  Administered 2022-02-06 – 2022-02-08 (×3): 81 mg via ORAL
  Filled 2022-02-06 (×3): qty 1

## 2022-02-06 MED ORDER — LORATADINE 10 MG PO TABS: 10.0000 mg | ORAL_TABLET | Freq: Every day | ORAL | Status: DC | PRN

## 2022-02-06 MED ORDER — CLEVIDIPINE BUTYRATE 0.5 MG/ML IV EMUL
INTRAVENOUS | Status: DC | PRN
  Administered 2022-02-06: 2 mg/h via INTRAVENOUS

## 2022-02-06 MED ORDER — ALLOPURINOL 100 MG PO TABS
100.0000 mg | ORAL_TABLET | Freq: Every day | ORAL | Status: DC
  Administered 2022-02-08: 100 mg via ORAL
  Filled 2022-02-06 (×3): qty 1

## 2022-02-06 MED ORDER — OXYCODONE HCL 5 MG PO TABS: 5.0000 mg | ORAL_TABLET | ORAL | Status: DC | PRN

## 2022-02-06 MED ORDER — HYDRALAZINE HCL 20 MG/ML IJ SOLN
10.0000 mg | Freq: Once | INTRAMUSCULAR | Status: AC
  Administered 2022-02-06: 10 mg via INTRAVENOUS

## 2022-02-06 MED ORDER — CHLORHEXIDINE GLUCONATE 4 % EX LIQD
60.0000 mL | Freq: Once | CUTANEOUS | Status: DC
  Filled 2022-02-06: qty 60

## 2022-02-06 MED ORDER — AMLODIPINE BESYLATE 5 MG PO TABS
ORAL_TABLET | ORAL | Status: AC
  Filled 2022-02-06: qty 2

## 2022-02-06 MED ORDER — LIDOCAINE HCL (PF) 1 % IJ SOLN
INTRAMUSCULAR | Status: DC | PRN
  Administered 2022-02-06 (×2): 10 mL

## 2022-02-06 MED ORDER — HEPARIN (PORCINE) IN NACL 1000-0.9 UT/500ML-% IV SOLN
INTRAVENOUS | Status: AC
  Filled 2022-02-06: qty 500

## 2022-02-06 MED ORDER — VANCOMYCIN HCL IN DEXTROSE 1-5 GM/200ML-% IV SOLN
1000.0000 mg | Freq: Once | INTRAVENOUS | Status: AC
  Administered 2022-02-06: 1000 mg via INTRAVENOUS
  Filled 2022-02-06: qty 200

## 2022-02-06 MED ORDER — IOHEXOL 350 MG/ML SOLN
INTRAVENOUS | Status: DC | PRN
  Administered 2022-02-06: 65 mL

## 2022-02-06 MED ORDER — SODIUM CHLORIDE 0.9% FLUSH
3.0000 mL | Freq: Two times a day (BID) | INTRAVENOUS | Status: DC
  Administered 2022-02-07 – 2022-02-08 (×3): 3 mL via INTRAVENOUS

## 2022-02-06 MED ORDER — SODIUM CHLORIDE 0.9% FLUSH: 3.0000 mL | INTRAVENOUS | Status: DC | PRN

## 2022-02-06 MED ORDER — SODIUM CHLORIDE 0.9 % IV SOLN: INTRAVENOUS | Status: DC

## 2022-02-06 MED ORDER — ACETAMINOPHEN 650 MG RE SUPP: 650.0000 mg | Freq: Four times a day (QID) | RECTAL | Status: DC | PRN

## 2022-02-06 MED ORDER — ACETAMINOPHEN 325 MG PO TABS
650.0000 mg | ORAL_TABLET | Freq: Four times a day (QID) | ORAL | Status: DC | PRN
  Administered 2022-02-06: 650 mg via ORAL
  Filled 2022-02-06: qty 2

## 2022-02-06 MED ORDER — PROPOFOL 10 MG/ML IV BOLUS
INTRAVENOUS | Status: DC | PRN
  Administered 2022-02-06: 10 mg via INTRAVENOUS

## 2022-02-06 MED ORDER — HEPARIN SODIUM (PORCINE) 1000 UNIT/ML IJ SOLN
INTRAMUSCULAR | Status: AC
  Filled 2022-02-06: qty 1

## 2022-02-06 MED ORDER — PROPOFOL 500 MG/50ML IV EMUL
INTRAVENOUS | Status: DC | PRN
  Administered 2022-02-06: 20 ug/kg/min via INTRAVENOUS

## 2022-02-06 MED ORDER — HYDRALAZINE HCL 20 MG/ML IJ SOLN
INTRAMUSCULAR | Status: AC
  Filled 2022-02-06: qty 1

## 2022-02-06 MED ORDER — ONDANSETRON HCL 4 MG/2ML IJ SOLN: 4.0000 mg | Freq: Four times a day (QID) | INTRAMUSCULAR | Status: DC | PRN

## 2022-02-06 SURGICAL SUPPLY — 36 items
BAG SNAP BAND KOVER 36X36 (MISCELLANEOUS) ×6 IMPLANT
CABLE ADAPT PACING TEMP 12FT (ADAPTER) IMPLANT
CATH 26 ULTRA DELIVERY (CATHETERS) IMPLANT
CATH DIAG 6FR PIGTAIL ANGLED (CATHETERS) IMPLANT
CATH INFINITI 5FR JL4 (CATHETERS) IMPLANT
CATH INFINITI 6F AL2 (CATHETERS) IMPLANT
CATH S G BIP PACING (CATHETERS) IMPLANT
CLOSURE MYNX CONTROL 6F/7F (Vascular Products) IMPLANT
CLOSURE PERCLOSE PROSTYLE (VASCULAR PRODUCTS) IMPLANT
CRIMPER (MISCELLANEOUS) IMPLANT
DEVICE INFLATION ATRION QL2530 (MISCELLANEOUS) IMPLANT
KIT HEART LEFT (KITS) ×3 IMPLANT
KIT MICROPUNCTURE NIT STIFF (SHEATH) IMPLANT
KIT SAPIAN 3 ULTRA RESILIA 26 (Valve) IMPLANT
PACK CARDIAC CATHETERIZATION (CUSTOM PROCEDURE TRAY) ×3 IMPLANT
PROTECTION STATION PRESSURIZED (MISCELLANEOUS) ×2
SHEATH BRITE TIP 7FR 35CM (SHEATH) IMPLANT
SHEATH INTRODUCER SET 20-26 (SHEATH) IMPLANT
SHEATH PINNACLE 6F 10CM (SHEATH) IMPLANT
SHEATH PINNACLE 8F 10CM (SHEATH) IMPLANT
SHEATH PROBE COVER 6X72 (BAG) IMPLANT
SHIELD RADPAD SCOOP 12X17 (MISCELLANEOUS) IMPLANT
SLEEVE REPOSITIONING LENGTH 30 (MISCELLANEOUS) IMPLANT
STATION PROTECTION PRESSURIZED (MISCELLANEOUS) IMPLANT
STOPCOCK MORSE 400PSI 3WAY (MISCELLANEOUS) ×6 IMPLANT
SYR MEDRAD MARK V 150ML (SYRINGE) IMPLANT
TRANSDUCER W/STOPCOCK (MISCELLANEOUS) ×6 IMPLANT
TUBING ART PRESS 72  MALE/FEM (TUBING) ×2
TUBING ART PRESS 72 MALE/FEM (TUBING) IMPLANT
TUBING CIL FLEX 10 FLL-RA (TUBING) IMPLANT
TUBING CONTRAST HIGH PRESS 48 (TUBING) IMPLANT
WIRE AMPLATZ SS-J .035X180CM (WIRE) IMPLANT
WIRE EMERALD 3MM-J .035X150CM (WIRE) IMPLANT
WIRE EMERALD 3MM-J .035X260CM (WIRE) IMPLANT
WIRE EMERALD ST .035X260CM (WIRE) IMPLANT
WIRE SAFARI SM CURVE 275 (WIRE) IMPLANT

## 2022-02-06 NOTE — Anesthesia Procedure Notes (Signed)
Arterial Line Insertion Start/End1/30/2024 8:00 AM, 02/06/2022 8:10 AM Performed by: Lance Coon, CRNA, CRNA  Patient location: Pre-op. Preanesthetic checklist: patient identified, IV checked, site marked, risks and benefits discussed, surgical consent, monitors and equipment checked, pre-op evaluation, timeout performed and anesthesia consent Lidocaine 1% used for infiltration Right, radial was placed Catheter size: 20 G Hand hygiene performed  and maximum sterile barriers used   Attempts: 1 Procedure performed without using ultrasound guided technique. Following insertion, dressing applied and Biopatch. Post procedure assessment: normal and unchanged  Patient tolerated the procedure well with no immediate complications.

## 2022-02-06 NOTE — H&P (Signed)
[address]       [name],[name] [id number]             [phone]                                   [facility] [place] Medical Record #[id number] Date of Birth: 12-30-1926   Mike Mocha, MD Mike Rail, MD   Chief Complaint: Worsening DOE   History of Present Illness:     89 yo wm with worsening DOE to the point any exertional activity has significant SOB. Pt has no CP or lightheadedness. Pt has history of CAD with stenting and eventual CABG in 2013. However pt with known occlusion of SVG and only patent LIMA to LAD on previous cath. Pt underwent recent TTE with now severe AS with a mean gradient of 45mGh and pk gradient of 419mg. He as a AVA of 0.81cm2 and DVI of 0.27. He has EF of 55-60%he has a bulky aortic valve on echo. He underwent CTA with anatomy acceptable for TAVR with however height of 1160mor LM and RCA.             Past Medical History:  Diagnosis Date   Acute superficial venous thrombosis of lower extremity      a. RLE after CABG, neg dopp for DVT.   Allergy     Anemia     AV block, 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 03/26/2012   Bilateral plantar fasciitis 10/25/2020   CAD (coronary artery disease)      a. S/P stenting to mid RCA, prox PDA 06/1999. b. NSTEMI/CABG x 3 in 10/2011 with LIMA to LAD, SVG to PDA, and SVG to OM1.    Cephalalgia 07/14/2015   CHB (complete heart block) 05/03/2012    Overview:  Status post complete heart block heart rate 28 bpm, alternating with 2 to one AV block and drug for bradycardia.  Status post pacemaker implantation.status post Medtronic pacemaker implantation the 03/28/2012   Chronic diastolic CHF (congestive heart failure) 02/05/2018   Chronic gouty arthritis     Chronic UTI      a. Followed by Dr. GraRisa Grillcolonized/asymptomatic - not on abx   CKD (chronic kidney disease)      stage 3, GFR 30-59 ml/min; stable with a creatinine around 1.9-2.0 followed by nephrology.   Constipation  03/01/2015    Symptoms and exam consistent with constipation. Abdominal exam is benign with no evidence of pain or obstruction. Discussed importance of increasing fiber and water intake coupled with physical activity to assist with bowel movements. Continue over-the-counter medication management as needed. Follow-up if symptoms worsen or fail to improve.   Coronary atherosclerosis 11/27/2011    Overview:  Multivessel coronary artery disease recently diagnosed by catheterization 2013 History of stent placement with a heparin-coated stent 2001  Last Assessment & Plan:  Status post coronary bypass grafting.  No recurrent chest pain. Overview:  Overview:  S/P stenting to mid RCA, prox PDA 06/1999;  06/2007 Myoview: negative except for apical thinning, EF 68%, last Myoview August 10, 2008 with n   Degeneration of lumbar intervertebral disc     Degenerative disc disease, cervical, with radiculopathy 09/22/46/1856Diastolic dysfunction 06/31/49/7026 Grade 1 DD on Echo 06/2017   Dyslipidemia     Dysphagia 02/06/2016    3/18 - DG Esophagus:  1. Mild esophageal dysmotility, likely presbyesophagus. 2. No other explanation for patient's symptoms. 3. Small hiatal hernia.  On swallow eval - evidence of cervical spine disease and that was likely contributing   Echocardiogram      Echocardiogram 08/2018: EF 50-55, inf-lat AK, mild LVH, Gr 1 DD, RVSP 47, mild LAE, mild to mod MR, mild AI, mild AS (mean 11)   Epistaxis 12/08/2020   Essential hypertension 07/20/2006   Fatigue 05/18/2017   GERD (gastroesophageal reflux disease)     Gout 05/12/2018   Gouty arthritis of right great toe 09/03/2017    Left toe Injected January 31, 2018    Hyperpigmentation 11/04/2017   Internal hemorrhoids 08/15/2016   Left inguinal hernia 07/01/2019   Lumbar post-laminectomy syndrome 12/12/2017   Moderate aortic regurgitation 07/04/2017    Echo 06/2017:  EF 55-60%, mild LVH, grade 1 DD, mild AS, mod AR, mild MR, mild-mod TR   Myocardial  infarction     Neck injury      a. C3-C4 and C4-C5 foraminal narrowing, severe   Numbness of left foot 10/21/2019   Pacemaker 04/10/2012    Medtronic pacemaker  Last Assessment & Plan:  Status post pacemaker implantation for symptomatic bradycardia.  Pacemaker site is slightly tender and erythematous.  A prescription of Keflex was dispensed.  I also asked the patient to closely followup with the EP service regarding his pacemaker placement. 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  This was switched to by mouth amiodarone at the time of the thoracic surgery appointment was discontinued.   Presence of aortocoronary bypass graft 10/24/2011    Overview:  Performed at [facility] 2013 Last Assessment & Plan:  No complication post coronary bypass grafting.   Prostate cancer ([place])      a. 2001 s/p TURP.   Pulmonary nodule      a. felt to be noncancerous.  Status post followup CT scan 4 mm and stable.   Renal artery stenosis      a. 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 03/28/12   Trochanteric bursitis of hip, bilateral 01/10/2017   UGIB (upper gastrointestinal bleed) 02/01/2018    EGD  02/06/18 - mod, non-erosive gastritis   Venous insufficiency of leg 06/05/2010   Vitamin B12 deficiency 08/23/2009    Jan '14  July '14 B12 level  >1500    472   Weakness of both lower  extremities 04/09/2020           Past Surgical History:  Procedure Laterality Date   ANTERIOR CHAMBER WASHOUT Left 10/04/2018    Procedure: Anterior Chamber Washout, Vitreous Tap;  Surgeon: Jalene Mullet, MD;  Location: [place];  Service: Ophthalmology;  Laterality: Left;   cardia catherization   07-07-99   CARDIAC SURGERY   10/18/12    open heart surgery   CATARACT EXTRACTION W/ INTRAOCULAR LENS  IMPLANT, BILATERAL   3/205, 06/2013    [facility]   COLONOSCOPY   04/12/07   CORONARY ARTERY BYPASS GRAFT   10/19/2011    Procedure: CORONARY ARTERY BYPASS GRAFTING (CABG);  Surgeon: Gaye Pollack, MD;  Location: [place];  Service: Open Heart Surgery;  Laterality: N/A;  times three using Left Internal Mammary Artery and Right Greater Saphenouse Vein Graft harvested Endoscopically   edg   07-17-1994   FLEXIBLE SIGMOIDOSCOPY   11-03-1997   GAS INSERTION Left 10/04/2018    Procedure: Insertion Of Gas;  Surgeon: Jalene Mullet, MD;  Location: [place];  Service: Ophthalmology;  Laterality: Left;   GAS/FLUID EXCHANGE Left 10/04/2018    Procedure: Gas/Fluid Exchange;  Surgeon: Jalene Mullet, MD;  Location: [place];  Service: Ophthalmology;  Laterality: Left;   LEFT HEART CATHETERIZATION WITH CORONARY ANGIOGRAM N/A 10/16/2011    Procedure: LEFT HEART CATHETERIZATION WITH CORONARY ANGIOGRAM;  Surgeon: Peter M Martinique, MD;  Location: [facility] CATH LAB;  Service: Cardiovascular;  Laterality: N/A;   LEFT HEART CATHETERIZATION WITH CORONARY/GRAFT ANGIOGRAM N/A 03/11/2013    Procedure: LEFT HEART CATHETERIZATION WITH [name];  Surgeon: Blane Ohara, MD;  Location: [facility] CATH LAB;  Service: Cardiovascular;  Laterality: N/A;   lumbar spinal disk and neck fusion surgery       PACEMAKER INSERTION   03/28/12    PPM implanted for mobitz II AV block   PARS PLANA VITRECTOMY Left 10/04/2018    Procedure: PARS PLANA VITRECTOMY 25 GAUGE FOR ENDOPHTHALMITIS WITH INJECTION OF INTRAVITREAL ANTIBIOTIC;  Surgeon: Jalene Mullet,  MD;  Location: [place];  Service: Ophthalmology;  Laterality: Left;   peripheral vascular catherization   11-24-03   PERMANENT PACEMAKER INSERTION N/A 03/28/2012    Procedure: PERMANENT PACEMAKER INSERTION;  Surgeon: Thompson Grayer, MD;  Location: [facility] CATH LAB;  Service: Cardiovascular;  Laterality: N/A;   PROSTATECTOMY       renal circulation   10-01-03   s/p ptca       stents        X 2   stress cardiolite   05-04-05    spring 09-negative except for apical thinning, EF 68%      Social History       Tobacco Use  Smoking Status Never  Smokeless Tobacco Never    Social History        Substance and Sexual Activity  Alcohol Use Not Currently    Comment: Rarely      Social History         Socioeconomic History   Marital status: Married      Spouse name: [name]   Number of children: 2   Years of education: 13   Highest education level: Some college, no degree  Occupational History   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  married '52 - 3 years, divorced; married '56 - 3 years divorced; married '  63-12 yrs - divorced; married '75 -. 1 son '57; 1 daughter - '53; 1 grandchild.  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  Very happily married.  End of life care: yes CPR, no long term mechanical ventilation, no heroic measures.right handed    Right handed    Social Determinants of Health        Financial Resource Strain: Low Risk  (07/28/2021)    Overall Financial Resource Strain  (CARDIA)     Difficulty of Paying Living Expenses: Not hard at all  Food Insecurity: No Food Insecurity (07/28/2021)    Hunger Vital Sign     Worried About Running Out of Food in the Last Year: Never true     Ran Out of Food in the Last Year: Never true  Transportation Needs: No Transportation Needs (07/28/2021)    PRAPARE - Armed forces logistics/support/administrative officer (Medical): No     Lack of Transportation (Non-Medical): No  Physical Activity: Insufficiently Active (07/28/2021)    Exercise Vital Sign     Days of Exercise per Week: 2 days     Minutes of Exercise per Session: 20 min  Stress: No Stress Concern Present (07/28/2021)    [place]     Feeling of Stress : Not at all  Social Connections: [place] (07/28/2021)    Social Connection and Isolation Panel [NHANES]     Frequency of Communication with Friends and Family: Three times a week     Frequency of Social Gatherings with Friends and Family: Three times a week     Attends Religious Services: More than 4 times per year     Active Member of Clubs or Organizations: Yes     Attends Archivist Meetings: 1 to 4 times per year     Marital Status: Married  Human resources officer Violence: Not At Risk (07/28/2021)    Humiliation, Afraid, Rape, and Kick questionnaire     Fear of Current or Ex-Partner: No     Emotionally Abused: No     Physically Abused: No     Sexually Abused: No           Allergies  Allergen Reactions   Aspirin Nausea And Vomiting and Other (See Comments)      Upset stomach- tolerates coated aspirin     Lisinopril Anaphylaxis and Shortness Of Breath      After 3 tablets, he experienced trouble swallowing, throat tightness and hoarseness.    Amoxicillin Nausea Only   Atarax [Hydroxyzine Hcl] Nausea And Vomiting   Cephalexin Diarrhea   Ciprofloxacin Nausea Only   Clindamycin Other (See Comments)      Reaction not recalled    Clobetasol Other (See Comments)      Not effective   Codeine Nausea Only and Other (See Comments)      Stomach upset   Fish Allergy Nausea And Vomiting   Fluarix [Influenza Virus Vaccine] Itching   Haemophilus Influenzae Itching   Hydrocodone Nausea Only   Hydrocodone-Acetaminophen Nausea And Vomiting   Hydroxyzine Nausea And Vomiting   Latex Itching and Other (See Comments)      (After flu shot)   Macrobid [Nitrofurantoin Macrocrystal] Nausea Only   Niacin Other (See Comments)      Unknown reaction   Niacin-Lovastatin Er Other (See Comments)      Unsure of reaction. Taking simvastatin at home without problems   Niacin-Lovastatin Er  Other (See Comments)      Reaction not recalled   Nitrofurantoin Other (See Comments)      Upset stomach     Omeprazole Other (See Comments)      Reaction not recalled- stopped by MD   Other Nausea And Vomiting   Tramadol     Vibramycin [Doxycycline] Other (See Comments)      Reaction not recalled   Adhesive [Tape] Rash   Bactrim [Sulfamethoxazole-Trimethoprim] Rash   Colchicine Rash   Gabapentin        Painful pimples on tongue   Nortriptyline Rash            Current Outpatient Medications  Medication Sig Dispense Refill   acetaminophen (TYLENOL) 500 MG tablet Take 500 mg by mouth every 6 (six) hours as needed.       allopurinol (ZYLOPRIM) 100 MG tablet TAKE 1 TABLET(100 MG) BY MOUTH DAILY 30 tablet 0   amLODipine (NORVASC) 5 MG tablet TAKE 1 TABLET DAILY 90 tablet 3   Aromatic Inhalants (VICKS VAPOR INHALER IN) Place 1 puff into both nostrils as needed (for congestion).       Ascorbic Acid (VITAMIN C) 1000 MG tablet         aspirin EC 81 MG tablet Take 81 mg by mouth daily.       Calcium Citrate-Vitamin D (CITRACAL + D PO) Take 2 tablets by mouth in the morning and at bedtime.       Carboxymeth-Glycerin-Polysorb (REFRESH OPTIVE MEGA-3 OP) Place 1 drop into both eyes 2 (two) times daily.       Cholecalciferol 25 MCG (1000 UT) capsule Take  1,000 Units by mouth daily.       clobetasol cream (TEMOVATE) 0.05 % Apply topically 2 (two) times daily.       cyanocobalamin (,VITAMIN B-12,) 1000 MCG/ML injection Inject 1,000 mcg into the muscle once. Monthly injection       folic acid (FOLVITE) 1 MG tablet Take 1 tablet (1 mg total) by mouth daily. Annual appt due in May must see provider for future refills 90 tablet 2   furosemide (LASIX) 40 MG tablet TAKE 1 TABLET BY MOUTH EVERY DAY (Patient taking differently: as needed.) 90 tablet 0   halobetasol (ULTRAVATE) 0.05 % cream Apply topically as needed.       loratadine (CLARITIN) 10 MG tablet Take 10 mg by mouth daily as needed (for seasonal allergies).       meclizine (ANTIVERT) 25 MG tablet TAKE 1 TABLET(25 MG) BY MOUTH AT BEDTIME AS NEEDED FOR DIZZINESS 30 tablet 0   Multiple Vitamins-Minerals (PRESERVISION AREDS 2 PO) Take by mouth.       nitroGLYCERIN (NITROSTAT) 0.4 MG SL tablet DISSOLVE 1 TABLET UNDER THE TONGUE EVERY 5 MINUTES AS NEEDED FOR CHEST PAIN, MAXIMUM 3 TABLETS 100 tablet 1   ofloxacin (OCUFLOX) 0.3 % ophthalmic solution Place into the left eye. Per patient taking for 1 week       Saline (AYR NASAL MIST ALLERGY/SINUS NA) Place into the nose.       Vitamin D, Ergocalciferol, (DRISDOL) 1.25 MG (50000 UNIT) CAPS capsule Take by mouth.                 Current Facility-Administered Medications  Medication Dose Route Frequency Provider Last Rate Last Admin   NON FORMULARY 1 application  1 application  Topical PRN Landis Martins, DPM                 Family History  Problem Relation Age of Onset   Coronary artery disease Father          died @ 9   Other Mother          cerebral hemorrhage - died @ 69   Arthritis Mother     Cancer Brother          Bladder   Prostate cancer Brother     Nephritis Brother          died @ age 10.   Other Brother          cerebral hemorrhage - died @ 70   Heart disease Brother     Breast cancer Other          niece x 2   Diabetes Neg Hx      Colon cancer Neg Hx     Adrenal disorder Neg Hx              Physical Exam: Lungs: some rhonchi Card: RR with 3/6 sem Ext: no edema Neuro: intact. Has some memory issues         Diagnostic Studies & Laboratory data: I have personally reviewed the following studies and agree with the findings  TTE (12/2021)  IMPRESSIONS   1. Left ventricular ejection fraction, by estimation, is 55 to 60%. The  left ventricle has normal function. The left ventricle has no regional  wall motion abnormalities. There is moderate left ventricular hypertrophy.  Left ventricular diastolic  parameters are consistent with Grade I diastolic dysfunction (impaired  relaxation).   2. Pacing wires in RV/RA. Right ventricular systolic function is normal.  The right ventricular size is normal. There is mildly elevated pulmonary  artery systolic pressure.   3. Left atrial size was severely dilated.   4. The mitral valve is degenerative. Moderate mitral valve regurgitation.  No evidence of mitral stenosis. Moderate mitral annular calcification.   5. Tricuspid valve regurgitation is moderate.   6. Bulky severe calcification fo AV with restricted motion DVI 0.26 and  AVA by VTI 0.85 cm2 and calculated DVI 0.26 Overall considered severe AS  and gradients have increased from prior ech 01/06/21. The aortic valve is  tricuspid. There is severe  calcifcation of the aortic valve. There is severe thickening of the aortic  valve. Aortic valve regurgitation is trivial. Severe aortic valve  stenosis.   7. The inferior vena cava is normal in size with greater than 50%  respiratory variability, suggesting right atrial pressure of 3 mmHg.   FINDINGS   Left Ventricle: Left ventricular ejection fraction, by estimation, is 55  to 60%. The left ventricle has normal function. The left ventricle has no  regional wall motion abnormalities. The left ventricular internal cavity  size was normal in size. There is   moderate  left ventricular hypertrophy. Left ventricular diastolic  parameters are consistent with Grade I diastolic dysfunction (impaired  relaxation).   Right Ventricle: Pacing wires in RV/RA. The right ventricular size is  normal. No increase in right ventricular wall thickness. Right ventricular  systolic function is normal. There is mildly elevated pulmonary artery  systolic pressure. The tricuspid  regurgitant velocity is 3.15 m/s, and with an assumed right atrial  pressure of 3 mmHg, the estimated right ventricular systolic pressure is  27.0 mmHg.   Left Atrium: Left atrial size was severely dilated.   Right Atrium: Right atrial size was normal in size.   Pericardium: There is no evidence of pericardial effusion.   Mitral Valve:  The mitral valve is degenerative in appearance. There is  moderate thickening of the mitral valve leaflet(s). There is moderate  calcification of the mitral valve leaflet(s). Moderate mitral annular  calcification. Moderate mitral valve  regurgitation. No evidence of mitral valve stenosis.   Tricuspid Valve: The tricuspid valve is normal in structure. Tricuspid  valve regurgitation is moderate . No evidence of tricuspid stenosis.   Aortic Valve: Bulky severe calcification fo AV with restricted motion DVI  0.26 and AVA by VTI 0.85 cm2 and calculated DVI 0.26 Overall considered  severe AS and gradients have increased from prior ech 01/06/21. The aortic  valve is tricuspid. There is  severe calcifcation of the aortic valve. There is severe thickening of the  aortic valve. Aortic valve regurgitation is trivial. Aortic regurgitation  PHT measures 783 msec. Severe aortic stenosis is present. Aortic valve  mean gradient measures 26.0 mmHg.  Aortic valve peak gradient measures 40.7 mmHg. Aortic valve area, by VTI  measures 0.85 cm.   Pulmonic Valve: The pulmonic valve was normal in structure. Pulmonic valve  regurgitation is mild. No evidence of pulmonic  stenosis.   Aorta: The aortic root is normal in size and structure.   Venous: The inferior vena cava is normal in size with greater than 50%  respiratory variability, suggesting right atrial pressure of 3 mmHg.   IAS/Shunts: No atrial level shunt detected by color flow Doppler.   Additional Comments: A device lead is visualized.     LEFT VENTRICLE  PLAX 2D  LVIDd:         4.90 cm   Diastology  LVIDs:         3.50 cm   LV e' medial:    5.77 cm/s  LV PW:         1.10 cm   LV E/e' medial:  18.9  LV IVS:        1.30 cm   LV e' lateral:   8.38 cm/s  LVOT diam:     2.00 cm   LV E/e' lateral: 13.0  LV SV:         68  LV SV Index:   37        2D Longitudinal Strain  LVOT Area:     3.14 cm  2D Strain GLS (A2C):   -19.5 %                           2D Strain GLS (A3C):   -15.2 %                           2D Strain GLS (A4C):   -16.6 %                           2D Strain GLS Avg:     -17.1 %   RIGHT VENTRICLE  RV Basal diam:  3.90 cm  RVSP:           42.7 mmHg   LEFT ATRIUM             Index        RIGHT ATRIUM           Index  LA diam:        5.70 cm 3.07 cm/m   RA Pressure: 3.00 mmHg  LA Vol (A2C):   82.0 ml 44.11 ml/m  RA Area:  18.80 cm  LA Vol (A4C):   64.6 ml 34.75 ml/m  RA Volume:   51.30 ml  27.60 ml/m  LA Biplane Vol: 75.0 ml 40.35 ml/m   AORTIC VALVE  AV Area (Vmax):    0.98 cm  AV Area (Vmean):   0.81 cm  AV Area (VTI):     0.85 cm  AV Vmax:           319.00 cm/s  AV Vmean:          239.000 cm/s  AV VTI:            0.805 m  AV Peak Grad:      40.7 mmHg  AV Mean Grad:      26.0 mmHg  LVOT Vmax:         99.20 cm/s  LVOT Vmean:        61.900 cm/s  LVOT VTI:          0.217 m  LVOT/AV VTI ratio: 0.27  AI PHT:            783 msec    AORTA  Ao Root diam: 3.20 cm  Ao Asc diam:  3.60 cm   MITRAL VALVE                  TRICUSPID VALVE  MV Area (PHT):                TR Peak grad:   39.7 mmHg  MV Decel Time:                TR Vmax:        315.00 cm/s  MR Peak  grad:    32.9 mmHg    Estimated RAP:  3.00 mmHg  MR Mean grad:    20.0 mmHg    RVSP:           42.7 mmHg  MR Vmax:         287.00 cm/s  MR Vmean:        208.0 cm/s   SHUNTS  MR PISA:         2.26 cm     Systemic VTI:  0.22 m  MR PISA Eff ROA: 23 mm       Systemic Diam: 2.00 cm  MR PISA Radius:  0.60 cm  MV E velocity: 109.00 cm/s  MV A velocity: 113.00 cm/s  MV E/A ratio:  0.96       Recent Radiology Findings:   CTA (01/2022) FINDINGS: Aortic Root:   Aortic valve: Trileaflet   Aortic valve calcium score: 1870   Aortic annulus:   Diameter: 61m x 233m  Perimeter: 8071m Area: 497 mm^2   Calcifications: No calcifications   Coronary height: Min Left - 17m66min Right - 17mm3minotubular height: Left cusp - 18mm;57mht cusp - 17mm; 44moronary cusp - 21mm   49m (as measured 3 mm below the annulus):   Diameter: 30mm x 24m  Ar19m491 mm^2   Calcifications: No calcifications   Aortic sinus width: Left cusp - 31mm; Righ38msp - 30mm; Nonco20mry cusp - 32mm   Sinot51mar junction width: 29mm x 27mm  22mimum73moroscopic Angle for Delivery: LAO 2 CAU 18   Cardiac:   Right atrium: Mild enlargement   Right ventricle: Normal size   Pulmonary arteries: Normal size   Pulmonary veins: Normal configuration   Left atrium: Mild enlargement.  PFO   Left ventricle:  Moderate hypertrophy. Normal size. Basal to mid inferior akinesis.   Pericardium: Normal thickness   Coronary arteries: S/p CABG with patent LIMA-LAD. SVG-PDA and SVG-OM1 are occluded   IMPRESSION: 1. Trileaflet aortic valve with moderate calcifications (AV calcium score 1870)   2. Aortic annulus measures 68m x 26min diameter with perimeter 8075mnd area 497m39m No annular or LVOT calcifications. Annular measurements are suitable for delivery of 26mm5mards Sapien 3 valve.   3.  Low coronary height to left main (11mm)102m RCA (11mm) 71m  Optimum Fluoroscopic Angle for Delivery:   LAO 2 CAU 18   5.  S/p CABG with patent LIMA-LAD.  SVG-PDA and SVG-OM1 are occluded   6.  Basal to mid LV inferior wall akinesis.     Recent Lab Findings: Recent Labs       Lab Results  Component Value Date    WBC 6.7 10/28/2021    HGB 10.8 (L) 10/28/2021    HCT 34.0 (L) 10/28/2021    PLT 211 10/28/2021    GLUCOSE 89 12/20/2021    CHOL 122 09/04/2016    TRIG 59.0 09/04/2016    HDL 40.40 09/04/2016    LDLCALC 70 09/04/2016    ALT 7 12/20/2021    AST 16 12/20/2021    NA 139 12/20/2021    K 5.1 12/20/2021    CL 105 12/20/2021    CREATININE 2.11 (H) 12/20/2021    BUN 44 (H) 12/20/2021    CO2 20 12/20/2021    TSH 1.827 02/02/2018    INR 1.2 (H) 03/04/2013            Assessment / Plan:     Pt with NYHA class III symptoms of severe AS with normal LV function and history of CABG with patent LIMA. Pt has acceptable access for 26mm Sa7m via the Right femoral approach. Pt with low coronary ostia that I feel is not prohibitive and with his CRI I dont feel that repeat Cath would be needed and that perhaps BAV could determine if heights are an issue to consider protection. Other wise pt should undergo TAVR and that he is not [email or web address] [id number] his [id number] [email or web address]

## 2022-02-06 NOTE — Discharge Summary (Incomplete)
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    Amoxicillin Nausea Only   Atarax [Hydroxyzine Hcl] Nausea And Vomiting   Cephalexin Diarrhea   Ciprofloxacin Nausea Only   Clindamycin Other (See Comments)    Gi upset    Clobetasol Other (See Comments)    Not effective   Codeine Nausea Only and Other (See Comments)    Stomach upset   Fish Allergy Nausea And Vomiting   Fluarix [Influenza Virus Vaccine] Itching   Haemophilus Influenzae Itching   Hydrocodone Nausea Only   Hydrocodone-Acetaminophen Nausea And Vomiting   Hydroxyzine Nausea And Vomiting   Latex Itching and Other (See Comments)    (After flu shot)   Macrobid [Nitrofurantoin Macrocrystal] Nausea Only   Niacin Other (See Comments)    Unknown reaction   Niacin-Lovastatin Er Other (See Comments)    Unsure of reaction. Taking simvastatin at home without problems   Niacin-Lovastatin Er Other (See  Comments)    Reaction not recalled   Nitrofurantoin Other (See Comments)    Upset stomach    Omeprazole Other (See Comments)    Reaction not recalled- stopped by MD   Other Nausea And Vomiting   Tramadol Other (See Comments)    Unknown    Vibramycin [Doxycycline] Other (See Comments)    Reaction not recalled   Adhesive [Tape] Rash   Bactrim [Sulfamethoxazole-Trimethoprim] Rash   Colchicine Rash   Gabapentin Other (See Comments)    Painful pimples on tongue   Nortriptyline Rash    Diagnostic Studies/Procedures    TAVR OPERATIVE NOTE     Date of Procedure:                02/06/2022   Preoperative Diagnosis:      Severe Aortic Stenosis    Postoperative Diagnosis:    Same    Procedure:        Transcatheter Aortic Valve Replacement - Percutaneous  Transfemoral Approach             Edwards Sapien 3 Ultra Resilia THV (size 26 mm, serial # [id number])              Co-Surgeons:                        Coralie Common, MD and [name], MD   Anesthesiologist:  Jana Half, DO   Echocardiographer:              Marry Guan, MD   Pre-operative Echo Findings: Severe aortic stenosis Normal left ventricular systolic function   Post-operative Echo Findings: No paravalvular leak Normal/unchanged left ventricular systolic function   _____________    Echo 02/07/22: completed but pending formal read at the time of discharge   History of Present Illness     [name] is a 89 y.o. male with a history of CABG in 2013 ([facility]) with early graft failure of all of his venous conduits and continued patency of the LIMA to LAD, CKD stage IIIb, chronic diastolic CHF, symptomatic bradycardia s/p PPM, anemia, prostate cancer, HTN, HLD, and severe AS who presented to [facility] on 02/06/22 for planned TAVR.   The patient has a history of RCA stenting. He ultimately was treated with multivessel CABG in 2013 with early graft failure of all of his venous conduits and continued  patency of the mammary artery to LAD graft. He had a nuclear stress test in 2019 demonstrating no significant ischemia. He recently developed DOE and fatigue. Echo 12/19/22 showed EF 55-60%, mod LVH, and severe AS mean grad 26 mmHg, peak grad 40.7 mmHg, AVA 0.81 cm2, DVI 0.27, SVI 37 as well as moderate MAC with moderate MR and moderate TR. Given age, advanced CKD, and well outlined coronary anatomy, it was decided to forgo pre op coronary angiography.   The patient was evaluated by the multidisciplinary valve team and felt to have severe, symptomatic aortic stenosis and to be a suitable candidate for TAVR, which was set up for 02/06/22.   Hospital Course     Consultants: none   Severe AS: s/p successful TAVR with a 26 mm Edwards Sapien 3 Ultra Resilia THV via the TF approach on 02/06/22. Post operative echo completed but pending formal read. Groin sites are stable, right side with some persistent oozing. Redressed before discharge. Continued on home Asprin 31m daily. Walked with cardiac rehab today with no issues. Plan for discharge home with close follow up in the outpatient setting next week.   HTN: BP elevated post operatively requiring IV NTG. I have increased home Norvasc from 564mto 1016maily. Will continue to monitor.   S/p PPM: followed by Dr. MeaMyles Gipreviously Allred).  There was some concern for atrial fibrillation during admission, although I was not convinced in reviewing ECGs and tele. We will continue remote monitoring of his device and if he develops a significant afib burden we can discuss [place] CKD stage IIIb: creat stable ~1.~4.09hronic diastolic CHF: appears euvolemic. No changes made. Resume PRN home lasix.   CAD s/p CABG: s/p CABG in 2013 ([facility]) with early graft failure of all of his venous conduits and continued patency of the LIMA to LAD. Continue medical therapy.   Renal lesion: of note, pre TAVR CTs showed a small hypodense left renal cortical lesions, stable size from  recent 10/28/2021 CT abdomen. Further characterization with MRI or CT abdomen without and with IV contrast may be considered as clinically warranted. Pt had PET 12/14 and nothing noted on left side of pelvis. I don't think further work up of this is indicated at this time.   _____________  Discharge Vitals Blood pressure (!) 178/93, pulse 67, temperature 98.3 F (36.8 C), temperature source Oral, resp. rate 17, height 5' 10"  (1.778 m), weight 71.5 kg, SpO2 97 %.  Filed Weights   02/05/22 1200 02/06/22  1761 02/07/22 0604  Weight: 68.9 kg 69.9 kg 71.5 kg     GEN: Well nourished, well developed, in no acute distress HEENT: normal Neck: no JVD or masses Cardiac: RRR; no murmurs, rubs, or gallops,no edema  Respiratory:  clear to auscultation bilaterally, normal work of breathing GI: soft, nontender, nondistended, + BS MS: no deformity or atrophy Skin: warm and dry, no rash.  Groin sites clear without hematoma or ecchymosis. Some mild oozing of bright red blood on right groin.  Neuro:  Alert and Oriented x 3, Strength and sensation are intact Psych: euthymic mood, full affect   Labs & Radiologic Studies    CBC Recent Labs    02/06/22 1153 02/07/22 0216  WBC  --  6.2  HGB 9.9* 9.3*  HCT 29.0* 28.4*  MCV  --  78.7*  PLT  --  607*   Basic Metabolic Panel Recent Labs    02/06/22 1153 02/07/22 0216  NA 141 138  K 3.9 3.5  CL 109 109  CO2  --  20*  GLUCOSE 104* 84  BUN 34* 34*  CREATININE 1.80* 1.59*  CALCIUM  --  9.1  MG  --  2.1   Liver Function Tests No results for input(s): "AST", "ALT", "ALKPHOS", "BILITOT", "PROT", "ALBUMIN" in the last 72 hours. No results for input(s): "LIPASE", "AMYLASE" in the last 72 hours. Cardiac Enzymes No results for input(s): "CKTOTAL", "CKMB", "CKMBINDEX", "TROPONINI" in the last 72 hours. BNP Invalid input(s): "POCBNP" D-Dimer No results for input(s): "DDIMER" in the last 72 hours. Hemoglobin A1C No results for input(s): "HGBA1C"  in the last 72 hours. Fasting Lipid Panel No results for input(s): "CHOL", "HDL", "LDLCALC", "TRIG", "CHOLHDL", "LDLDIRECT" in the last 72 hours. Thyroid Function Tests No results for input(s): "TSH", "T4TOTAL", "T3FREE", "THYROIDAB" in the last 72 hours.  Invalid input(s): "FREET3" _____________  ECHOCARDIOGRAM LIMITED  Result Date: 02/06/2022    ECHOCARDIOGRAM LIMITED REPORT   Patient Name:   [name] Date of Exam: 02/06/2022 Medical Rec #:  [id number]          Height:       70.0 in Accession #:    [id number]         Weight:       154.0 lb Date of Birth:  08-10-1926          BSA:          1.868 m Patient Age:    89 years           BP:           167/91 mmHg Patient Gender: M                  HR:           68 bpm. Exam Location:  Inpatient Procedure: Limited Echo, Limited Color Doppler and Cardiac Doppler Indications:     Aortic Stenosis  History:         Patient has prior history of Echocardiogram examinations, most                  recent 12/18/2021. CHF, Previous Myocardial Infarction and CAD,                  Pacemaker; Risk Factors:GERD, Hypertension and Dyslipidemia.                  Aortic Valve: 26 mm Sapien prosthetic, stented (TAVR) valve is  present in the aortic position. Procedure Date: 02/06/2022.  Sonographer:     Bernadene Person RDCS Referring Phys:  [place] Diagnosing Phys: Eleonore Chiquito MD  Sonographer Comments: 42m Edwards Sapien aortic valve placed. IMPRESSIONS  1. 26 mm S3 TAVR. Vmax 1 m/s, MG 2.0 mmHG, EOA 3.64 cm2, DI 0.82. No regurgitation or paravalvular leak. The aortic valve has been repaired/replaced. Aortic valve regurgitation is not visualized. No aortic stenosis is present. There is a 26 mm Sapien prosthetic (TAVR) valve present in the aortic position. Procedure Date: 02/06/2022.  2. Left ventricular ejection fraction, by estimation, is 55 to 60%. The left ventricle has normal function. The left ventricle has no regional wall motion  abnormalities.  3. Right ventricular systolic function is normal. The right ventricular size is normal.  4. The mitral valve is degenerative. Mild mitral valve regurgitation. No evidence of mitral stenosis.  5. Tricuspid valve regurgitation is mild to moderate. FINDINGS  Left Ventricle: Left ventricular ejection fraction, by estimation, is 55 to 60%. The left ventricle has normal function. The left ventricle has no regional wall motion abnormalities. Right Ventricle: The right ventricular size is normal. No increase in right ventricular wall thickness. Right ventricular systolic function is normal. Pericardium: There is no evidence of pericardial effusion. Mitral Valve: The mitral valve is degenerative in appearance. Mild mitral valve regurgitation. No evidence of mitral valve stenosis. Tricuspid Valve: The tricuspid valve is grossly normal. Tricuspid valve regurgitation is mild to moderate. No evidence of tricuspid stenosis. Aortic Valve: 26 mm S3 TAVR. Vmax 1 m/s, MG 2.0 mmHG, EOA 3.64 cm2, DI 0.82. No regurgitation or paravalvular leak. The aortic valve has been repaired/replaced. Aortic valve regurgitation is not visualized. Aortic regurgitation PHT measures 433 msec. No aortic stenosis is present. Aortic valve mean gradient measures 2.0 mmHg. Aortic valve peak gradient measures 3.9 mmHg. Aortic valve area, by VTI measures 3.64 cm. There is a 26 mm Sapien prosthetic, stented (TAVR) valve present in the aortic position. Procedure Date: 02/06/2022. Pulmonic Valve: The pulmonic valve was grossly normal. Pulmonic valve regurgitation is trivial. No evidence of pulmonic stenosis. Additional Comments: Spectral Doppler performed. Color Doppler performed.  LEFT VENTRICLE PLAX 2D LVOT diam:     2.38 cm LV SV:         89 LV SV Index:   47 LVOT Area:     4.45 cm  AORTIC VALVE AV Area (Vmax):    4.53 cm AV Area (Vmean):   1.63 cm AV Area (VTI):     3.64 cm AV Vmax:           98.20 cm/s AV Vmean:          169.150 cm/s AV  VTI:            0.243 m AV Peak Grad:      3.9 mmHg AV Mean Grad:      2.0 mmHg LVOT Vmax:         100.00 cm/s LVOT Vmean:        61.800 cm/s LVOT VTI:          0.199 m LVOT/AV VTI ratio: 0.82 AI PHT:            433 msec TRICUSPID VALVE TR Peak grad:   48.2 mmHg TR Vmax:        347.00 cm/s  SHUNTS Systemic VTI:  0.20 m Systemic Diam: 2.38 cm WEleonore ChiquitoMD Electronically signed by WEleonore ChiquitoMD Signature Date/Time: 02/06/2022/12:40:49 PM    Final  Structural Heart Procedure  Result Date: 02/06/2022 See surgical note for result.  DG Chest 2 View  Result Date: 02/04/2022 CLINICAL DATA:  Preop for aortic valve replacement. EXAM: CHEST - 2 VIEW COMPARISON:  May 23, 2020. FINDINGS: The heart size and mediastinal contours are within normal limits. Status post coronary bypass graft. Left-sided pacemaker is unchanged in position. Small bilateral pleural effusions are noted. No definite consolidative process is noted. The visualized skeletal structures are unremarkable. IMPRESSION: Small bilateral pleural effusions. No definite consolidative process is noted. Electronically Signed   By: Marijo Conception M.D.   On: 02/04/2022 20:33   CT ANGIO ABDOMEN PELVIS  W &/OR WO CONTRAST  Result Date: 01/11/2022 CLINICAL DATA:  Aortic valve replacement (TAVR), pre-op eval. Nonrheumatic aortic valve stenosis. Prostate cancer. * Tracking Code: BO * EXAM: CT ANGIOGRAPHY CHEST, ABDOMEN AND PELVIS TECHNIQUE: Multidetector CT imaging through the chest, abdomen and pelvis was performed using the standard protocol during bolus administration of intravenous contrast. Multiplanar reconstructed images and MIPs were obtained and reviewed to evaluate the vascular anatomy. RADIATION DOSE REDUCTION: This exam was performed according to the departmental dose-optimization program which includes automated exposure control, adjustment of the mA and/or kV according to patient size and/or use of iterative reconstruction technique. CONTRAST:   77m OMNIPAQUE IOHEXOL 350 MG/ML SOLN COMPARISON:  12/21/2021 PET-CT. 04/25/2020 chest CT. 10/28/2021 CT abdomen/pelvis. FINDINGS: CTA CHEST FINDINGS Cardiovascular: Top-normal heart size. Diffuse thickening and coarse calcification of the aortic valve. Two lead left subclavian pacemaker with lead tips in the right atrium and right ventricular apex. No significant pericardial effusion/thickening. Three-vessel coronary atherosclerosis status post CABG. Atherosclerotic nonaneurysmal thoracic aorta. Normal caliber pulmonary arteries. No central pulmonary emboli. Mediastinum/Nodes: No significant thyroid nodules. Unremarkable esophagus. No pathologically enlarged axillary, mediastinal or hilar lymph nodes. Lungs/Pleura: No pneumothorax. Trace dependent bilateral pleural effusions. A few scattered tiny solid right pulmonary nodules, largest 0.3 cm in the anterior right upper lobe (series 5/image 49), all stable since 04/25/2020 chest CT. No new significant pulmonary nodules. No acute consolidative airspace disease. Musculoskeletal: No aggressive appearing focal osseous lesions. Intact sternotomy wires. Moderate thoracic spondylosis. CTA ABDOMEN AND PELVIS FINDINGS Hepatobiliary: Normal liver with no liver mass. Normal gallbladder with no radiopaque cholelithiasis. No biliary ductal dilatation. Pancreas: Normal, with no mass or duct dilation. Spleen: Mild splenomegaly. Craniocaudal splenic length 13.2 cm. No splenic masses. Adrenals/Urinary Tract: Normal adrenals. No hydronephrosis. Several simple right renal cysts, largest 4.2 cm in the posterior upper right kidney, for which no follow-up imaging is recommended. Small hypodense left renal cortical lesions, largest 1.1 cm in the lateral interpolar left kidney (series 4/image 130), stable size from recent 10/28/2021 CT abdomen. Normal bladder. Stomach/Bowel: Normal non-distended stomach. Normal caliber small bowel with no small bowel wall thickening. Normal appendix.  Normal large bowel with no diverticulosis, large bowel wall thickening or pericolonic fat stranding. Vascular/Lymphatic: Atherosclerotic nonaneurysmal abdominal aorta. No pathologically enlarged lymph nodes in the abdomen or pelvis. Reproductive: Prostatectomy. Other: No pneumoperitoneum, ascites or focal fluid collection. Musculoskeletal: No aggressive appearing focal osseous lesions. Moderate lumbar spondylosis. VASCULAR MEASUREMENTS PERTINENT TO TAVR: AORTA: Minimal Aortic Diameter-15.1 x 13.8 mm Severity of Aortic Calcification-moderate RIGHT PELVIS: Right Common Iliac Artery - Minimal Diameter-8.8 x 8.6 mm Tortuosity-mild Calcification-moderate Right External Iliac Artery - Minimal Diameter-9.0 x 8.7 mm Tortuosity-mild-to-moderate Calcification-mild Right Common Femoral Artery - Minimal Diameter-9.3 x 8.2 mm Tortuosity-mild Calcification-mild LEFT PELVIS: Left Common Iliac Artery - Minimal Diameter-8.5 x 7.3 mm Tortuosity-mild Calcification-severe Left External Iliac Artery - Minimal Diameter-8.6  x 8.3 mm Tortuosity-moderate Calcification-mild Left Common Femoral Artery - Minimal Diameter-9.7 x 7.6 mm Tortuosity-mild Calcification-moderate Review of the MIP images confirms the above findings. IMPRESSION: 1. Vascular findings and measurements pertinent to potential TAVR procedure, as detailed. 2. Diffuse thickening and coarse calcification of the aortic valve, compatible with the reported history of aortic stenosis. 3. Trace dependent bilateral pleural effusions. 4. Mild splenomegaly.  No lymphadenopathy. 5. Small hypodense left renal cortical lesions, stable size from recent 10/28/2021 CT abdomen. Further characterization with MRI or CT abdomen without and with IV contrast may be considered as clinically warranted. Electronically Signed   By: Ilona Sorrel M.D.   On: 01/11/2022 08:38   CT ANGIO CHEST AORTA W/CM & OR WO/CM  Result Date: 01/11/2022 CLINICAL DATA:  Aortic valve replacement (TAVR), pre-op eval.  Nonrheumatic aortic valve stenosis. Prostate cancer. * Tracking Code: BO * EXAM: CT ANGIOGRAPHY CHEST, ABDOMEN AND PELVIS TECHNIQUE: Multidetector CT imaging through the chest, abdomen and pelvis was performed using the standard protocol during bolus administration of intravenous contrast. Multiplanar reconstructed images and MIPs were obtained and reviewed to evaluate the vascular anatomy. RADIATION DOSE REDUCTION: This exam was performed according to the departmental dose-optimization program which includes automated exposure control, adjustment of the mA and/or kV according to patient size and/or use of iterative reconstruction technique. CONTRAST:  3m OMNIPAQUE IOHEXOL 350 MG/ML SOLN COMPARISON:  12/21/2021 PET-CT. 04/25/2020 chest CT. 10/28/2021 CT abdomen/pelvis. FINDINGS: CTA CHEST FINDINGS Cardiovascular: Top-normal heart size. Diffuse thickening and coarse calcification of the aortic valve. Two lead left subclavian pacemaker with lead tips in the right atrium and right ventricular apex. No significant pericardial effusion/thickening. Three-vessel coronary atherosclerosis status post CABG. Atherosclerotic nonaneurysmal thoracic aorta. Normal caliber pulmonary arteries. No central pulmonary emboli. Mediastinum/Nodes: No significant thyroid nodules. Unremarkable esophagus. No pathologically enlarged axillary, mediastinal or hilar lymph nodes. Lungs/Pleura: No pneumothorax. Trace dependent bilateral pleural effusions. A few scattered tiny solid right pulmonary nodules, largest 0.3 cm in the anterior right upper lobe (series 5/image 49), all stable since 04/25/2020 chest CT. No new significant pulmonary nodules. No acute consolidative airspace disease. Musculoskeletal: No aggressive appearing focal osseous lesions. Intact sternotomy wires. Moderate thoracic spondylosis. CTA ABDOMEN AND PELVIS FINDINGS Hepatobiliary: Normal liver with no liver mass. Normal gallbladder with no radiopaque cholelithiasis. No  biliary ductal dilatation. Pancreas: Normal, with no mass or duct dilation. Spleen: Mild splenomegaly. Craniocaudal splenic length 13.2 cm. No splenic masses. Adrenals/Urinary Tract: Normal adrenals. No hydronephrosis. Several simple right renal cysts, largest 4.2 cm in the posterior upper right kidney, for which no follow-up imaging is recommended. Small hypodense left renal cortical lesions, largest 1.1 cm in the lateral interpolar left kidney (series 4/image 130), stable size from recent 10/28/2021 CT abdomen. Normal bladder. Stomach/Bowel: Normal non-distended stomach. Normal caliber small bowel with no small bowel wall thickening. Normal appendix. Normal large bowel with no diverticulosis, large bowel wall thickening or pericolonic fat stranding. Vascular/Lymphatic: Atherosclerotic nonaneurysmal abdominal aorta. No pathologically enlarged lymph nodes in the abdomen or pelvis. Reproductive: Prostatectomy. Other: No pneumoperitoneum, ascites or focal fluid collection. Musculoskeletal: No aggressive appearing focal osseous lesions. Moderate lumbar spondylosis. VASCULAR MEASUREMENTS PERTINENT TO TAVR: AORTA: Minimal Aortic Diameter-15.1 x 13.8 mm Severity of Aortic Calcification-moderate RIGHT PELVIS: Right Common Iliac Artery - Minimal Diameter-8.8 x 8.6 mm Tortuosity-mild Calcification-moderate Right External Iliac Artery - Minimal Diameter-9.0 x 8.7 mm Tortuosity-mild-to-moderate Calcification-mild Right Common Femoral Artery - Minimal Diameter-9.3 x 8.2 mm Tortuosity-mild Calcification-mild LEFT PELVIS: Left Common Iliac Artery - Minimal Diameter-8.5 x 7.3 mm  Tortuosity-mild Calcification-severe Left External Iliac Artery - Minimal Diameter-8.6 x 8.3 mm Tortuosity-moderate Calcification-mild Left Common Femoral Artery - Minimal Diameter-9.7 x 7.6 mm Tortuosity-mild Calcification-moderate Review of the MIP images confirms the above findings. IMPRESSION: 1. Vascular findings and measurements pertinent to  potential TAVR procedure, as detailed. 2. Diffuse thickening and coarse calcification of the aortic valve, compatible with the reported history of aortic stenosis. 3. Trace dependent bilateral pleural effusions. 4. Mild splenomegaly.  No lymphadenopathy. 5. Small hypodense left renal cortical lesions, stable size from recent 10/28/2021 CT abdomen. Further characterization with MRI or CT abdomen without and with IV contrast may be considered as clinically warranted. Electronically Signed   By: Ilona Sorrel M.D.   On: 01/11/2022 08:38   CT CORONARY MORPH W/CTA COR W/SCORE W/CA W/CM &/OR WO/CM  Addendum Date: 01/11/2022   ADDENDUM REPORT: 01/11/2022 08:09 EXAM: OVER-READ INTERPRETATION  CT CHEST The following report is an over-read performed by radiologist Dr. Samara Snide [facility] on 01/11/2022. This over-read does not include interpretation of cardiac or coronary anatomy or pathology. The cardiac CTA interpretation by the cardiologist is attached. COMPARISON:  02/14/2011 chest CT. FINDINGS: Please see the separate concurrent chest CT angiogram report for details. IMPRESSION: Please see the separate concurrent chest CT angiogram report for details. Electronically Signed   By: Ilona Sorrel M.D.   On: 01/11/2022 08:09   Result Date: 01/11/2022 CLINICAL DATA:  34M with severe aortic stenosis being evaluated for a TAVR procedure. EXAM: Cardiac TAVR CT TECHNIQUE: The patient was scanned on a [employer]. A 120 kV retrospective scan was triggered in the descending thoracic aorta at 111 HU's. Gantry rotation speed was 250 msecs and collimation was .6 mm. No beta blockade or nitro were given. The 3D data set was reconstructed in 5% intervals of the R-R cycle. Systolic and diastolic phases were analyzed on a dedicated work station using MPR, MIP and VRT modes. The patient received 100 cc of contrast. FINDINGS: Aortic Root: Aortic valve: Trileaflet Aortic valve calcium score: 1870 Aortic annulus:  Diameter: 20m x 213mPerimeter: 8051mrea: 497 mm^2 Calcifications: No calcifications Coronary height: Min Left - 53m47min Right - 53mm78motubular height: Left cusp - 18mm;69mht cusp - 17mm; 83moronary cusp - 21mm LV4mas measured 3 mm below the annulus): Diameter: 30mm x 231mArea2m1 mm^2 Calcifications: No calcifications Aortic sinus width: Left cusp - 31mm; Righ89msp - 30mm; Nonco18mry cusp - 32mm Sinotub79m junction width: 29mm x 27mm O56mum F57moscopic Angle for Delivery: LAO 2 CAU 18 Cardiac: Right atrium: Mild enlargement Right ventricle: Normal size Pulmonary arteries: Normal size Pulmonary veins: Normal configuration Left atrium: Mild enlargement.  PFO Left ventricle: Moderate hypertrophy. Normal size. Basal to mid inferior akinesis. Pericardium: Normal thickness Coronary arteries: S/p CABG with patent LIMA-LAD. SVG-PDA and SVG-OM1 are occluded IMPRESSION: 1. Trileaflet aortic valve with moderate calcifications (AV calcium score 1870) 2. Aortic annulus measures 28mm x 24mm in 58meter37mh perimeter 80mm and area 49778m. No annular76mLVOT calcifications. Annular measurements are suitable for delivery of 26mm Edwards Sapien38malve. 3.  Low coronary height to left main (53mm) and RCA (53mm)63m Optimum Flu96mcopic Angle for Delivery:  LAO 2 CAU 18 5.  S/p CABG with patent LIMA-LAD.  SVG-PDA and SVG-OM1 are occluded 6.  Basal to mid LV inferior wall akinesis. Electronically Signed: By: Christopher  Schumann Oswaldo Milian2:18   CUP PACEART REMOTE DEVICE CHECK  Result Date: 01/09/2022 Scheduled remote reviewed. Normal  device function.  8 AMS, all < 33mn Next remote 91 days- JJB  Disposition   Pt is being discharged home today in good condition.  Follow-up Plans & Appointments     Follow-up Information     MTommie Raymond NP. Go on 02/14/2022.   Specialty: Cardiology Why: @11  am, please arrive at least 10 minutes early. Contact information: [address] [place] [phone]               Discharge Instructions     Amb Referral to Cardiac Rehabilitation   Complete by: As directed    Diagnosis: Valve Replacement   Valve: Aortic Comment - TAVR   After initial evaluation and assessments completed: Virtual Based Care may be provided alone or in conjunction with Phase 2 Cardiac Rehab based on patient barriers.: Yes   Intensive Cardiac Rehabilitation (ICR) [facility] only OR Traditional Cardiac Rehabilitation (TCR) *If criteria for [facility]CR are not met [facility] Yes       Discharge Medications   Allergies as of 02/07/2022       Reactions   Aspirin Nausea And Vomiting, Other (See Comments)   Upset stomach- tolerates coated aspirin   Lisinopril Anaphylaxis, Shortness Of Breath   After 3 tablets, he experienced trouble swallowing, throat tightness and hoarseness.    Amoxicillin Nausea Only   Atarax [hydroxyzine Hcl] Nausea And Vomiting   Cephalexin Diarrhea   Ciprofloxacin Nausea Only   Clindamycin Other (See Comments)   Gi upset    Clobetasol Other (See Comments)   Not effective   Codeine Nausea Only, Other (See Comments)   Stomach upset   Fish Allergy Nausea And Vomiting   Fluarix [influenza Virus Vaccine] Itching   Haemophilus Influenzae Itching   Hydrocodone Nausea Only   Hydrocodone-acetaminophen Nausea And Vomiting   Hydroxyzine Nausea And Vomiting   Latex Itching, Other (See Comments)   (After flu shot)   Macrobid [nitrofurantoin Macrocrystal] Nausea Only   Niacin Other (See Comments)   Unknown reaction   Niacin-lovastatin Er Other (See Comments)   Unsure of reaction. Taking simvastatin at home without problems   Niacin-lovastatin Er Other (See Comments)   Reaction not recalled   Nitrofurantoin Other (See Comments)   Upset stomach   Omeprazole Other (See Comments)   Reaction not recalled- stopped by MD   Other Nausea And Vomiting   Tramadol Other (See Comments)   Unknown     Vibramycin [doxycycline] Other (See Comments)   Reaction not recalled   Adhesive [tape] Rash   Bactrim [sulfamethoxazole-trimethoprim] Rash   Colchicine Rash   Gabapentin Other (See Comments)   Painful pimples on tongue   Nortriptyline Rash        Medication List     TAKE these medications    acetaminophen 500 MG tablet Commonly known as: TYLENOL Take 1,000 mg by mouth 3 (three) times daily as needed for moderate pain.   allopurinol 100 MG tablet Commonly known as: ZYLOPRIM TAKE 1 TABLET(100 MG) BY MOUTH DAILY What changed:  how much to take how to take this when to take this   amLODipine 10 MG tablet Commonly known as: NORVASC Take 1 tablet (10 mg total) by mouth daily. What changed:  medication strength how much to take   aspirin EC 81 MG tablet Take 81 mg by mouth daily.   AYR NASAL MIST ALLERGY/SINUS NA Place 2 sprays into the nose as needed (dryness).   Cholecalciferol  25 MCG (1000 UT) capsule Take 1,000 Units by mouth daily.   CITRACAL + D PO Take 2 tablets by mouth in the morning and at bedtime.   cyanocobalamin 1000 MCG/ML injection Commonly known as: VITAMIN B12 Inject 1,000 mcg into the muscle once. Monthly injection   folic acid 1 MG tablet Commonly known as: FOLVITE Take 1 tablet (1 mg total) by mouth daily. Annual appt due in May must see provider for future refills   furosemide 40 MG tablet Commonly known as: LASIX TAKE 1 TABLET BY MOUTH EVERY DAY What changed:  when to take this reasons to take this   loratadine 10 MG tablet Commonly known as: CLARITIN Take 10 mg by mouth daily as needed (for seasonal allergies).   nitroGLYCERIN 0.4 MG SL tablet Commonly known as: NITROSTAT DISSOLVE 1 TABLET UNDER THE TONGUE EVERY 5 MINUTES AS NEEDED FOR CHEST PAIN, MAXIMUM 3 TABLETS What changed:  how much to take how to take this when to take this reasons to take this additional instructions   PRESERVISION AREDS 2 PO Take 1 tablet by  mouth daily.   REFRESH OPTIVE MEGA-3 OP Place 1 drop into both eyes 2 (two) times daily.   VICKS VAPOR INHALER IN Place 1 puff into both nostrils as needed (for congestion).   vitamin C 1000 MG tablet Take 1,000 mg by mouth daily.   Vitamin D (Ergocalciferol) 1.25 MG (50000 UNIT) Caps capsule Commonly known as: DRISDOL Take by mouth.            Outstanding Labs/Studies   none  Duration of Discharge Encounter   Greater than 30 minutes including physician time.  Mable Fill, PA-C 02/07/2022, 11:20 AM [phone]  Patient seen, examined. Available data reviewed. Agree with findings, assessment, and plan as outlined by Nell Range, PA-C. The patient is independently interviewed and examined this morning. His wife is present at the bedside. He had some oozing from the right groin site overnight. No bleeding or hematoma. He is alert oriented and in NAD. Lungs CTA, heart RRR with a/6 SEM at the RUSB, abdomen soft, NT. Right groin dressing saturated but no active bleeding. Left groin site clear. No hematoma or ecchymosis on either side. No edema. Tele with a-pacing. Will plan to DC home today if no more oozing from groin site. Otherwise may need to keep another day. Echo reviewed formal interpretation pending. LVEF normal with normal TAVR valve function by my assessment, no PVL and mean gradient 8 mmHg. [name] Post-TAVR restrictions discussed/reviewed with the patient and his wife. Follow-up arranged as outlined above.   [name], M.D. 02/07/2022 7:23 PM

## 2022-02-06 NOTE — Progress Notes (Signed)
Echocardiogram 2D Echocardiogram has been performed.  [name] 02/06/2022, 10:55 AM

## 2022-02-06 NOTE — Progress Notes (Signed)
Mobility Specialist: Progress Note   02/06/22 1830  Mobility  Activity Stood at bedside  Level of Assistance Maximum assist, patient does 25-49%  Assistive Device [employer] Ambulated (ft) 2 ft  Activity Response Tolerated poorly  Mobility Referral Yes  $Mobility charge 1 Mobility   Pre-Mobility: 64 HR, 134/61 (83) BP, 99% SpO2 Post-Mobility: 69 HR, 157/77 (103) BP, 98% SpO2  Pt received in the bed and agreeable to mobility. Began session with LE PROM d/t c/o stiffness and pain. Pt maxA to sit EOB after PROM exercises and minA to stand from EOB. Able to side step to [facility] with physical assist for weight shift. Pt with intermittent tremor throughout session limiting mobility. Recommend chair follow for ambulation. MaxA to get pt back to bed. Pt has call bell at his side. Bed alarm is on.   [place] Latondra Gebhart Mobility Specialist Please contact via SecureChat or Rehab office at [phone]

## 2022-02-06 NOTE — Anesthesia Postprocedure Evaluation (Signed)
Anesthesia Post Note  Patient: [name]  Procedure(s) Performed: Transcatheter Aortic Valve Replacement, Transfemoral (Right) INTRAOPERATIVE TRANSTHORACIC ECHOCARDIOGRAM     Patient location during evaluation: PACU Anesthesia Type: MAC Level of consciousness: awake and alert Pain management: pain level controlled Vital Signs Assessment: post-procedure vital signs reviewed and stable Respiratory status: spontaneous breathing, nonlabored ventilation, respiratory function stable and patient connected to nasal cannula oxygen Cardiovascular status: stable and blood pressure returned to baseline Postop Assessment: no apparent nausea or vomiting Anesthetic complications: no   There were no known notable events for this encounter.  Last Vitals:  Vitals:   02/06/22 1344 02/06/22 1346  BP: (!) 156/75 (!) 148/77  Pulse: 61 60  Resp: 12 15  Temp:    SpO2: 97% 98%    Last Pain:  Vitals:   02/06/22 1122  TempSrc: Temporal  PainSc: Asleep                 March Rummage Maelle Sheaffer

## 2022-02-06 NOTE — Discharge Instructions (Signed)
ACTIVITY AND EXERCISE  Daily activity and exercise are an important part of your recovery. People recover at different rates depending on their general health and type of valve procedure.  Most people recovering from TAVR feel better relatively quickly   No lifting, pushing, pulling more than 10 pounds (examples to avoid: groceries, vacuuming, gardening, golfing):             - For one week with a procedure through the groin.             - For six weeks for procedures through the chest wall or neck. NOTE: You will typically see one of our providers 7-14 days after your procedure to discuss WHEN TO RESUME the above activities.      DRIVING  Do not drive until you are seen for follow up and cleared by a provider. Generally, we ask patient to not drive for 1 week after their procedure.  If you have been told by your doctor in the past that you may not drive, you must talk with him/her before you begin driving again.   DRESSING  Groin site: you may leave the clear dressing over the site for up to one week or until it falls off.   HYGIENE  If you had a femoral (leg) procedure, you may take a shower when you return home. After the shower, pat the site dry. Do NOT use powder, oils or lotions in your groin area until the site has completely healed.  If you had a chest procedure, you may shower when you return home unless specifically instructed not to by your discharging practitioner.             - DO NOT scrub incision; pat dry with a towel.             - DO NOT apply any lotions, oils, powders to the incision.             - No tub baths / swimming for at least 2 weeks.  If you notice any fevers, chills, increased pain, swelling, bleeding or pus, please contact your doctor.   ADDITIONAL INFORMATION  If you are going to have an upcoming dental procedure, please contact our office as you will require antibiotics ahead of time to prevent infection on your heart valve.    If you have any questions  or concerns you can call the structural heart phone during normal business hours 8am-4pm. If you have an urgent need after hours or weekends please call [phone] to talk to the on call provider for general cardiology. If you have an emergency that requires immediate attention, please call 911.    After TAVR Checklist  Check  Test Description   Follow up appointment in 1-2 weeks  You will see our structural heart advanced practice provider. Your incision sites will be checked and you will be cleared to drive and resume all normal activities if you are doing well.     1 month echo and follow up  You will have an echo to check on your new heart valve and be seen back in the office by a structural heart advanced practice provider.   Follow up with your primary cardiologist You will need to be seen by your primary cardiologist in the following 3-6 months after your 1 month appointment in the valve clinic.    1 year echo and follow up You will have another echo to check on your heart valve after 1 year   and be seen back in the office by a structural heart advanced practice provider. This your last structural heart visit.   Bacterial endocarditis prophylaxis  You will have to take antibiotics for the rest of your life before all dental procedures (even teeth cleanings) to protect your heart valve. Antibiotics are also required before some surgeries. Please check with your cardiologist before scheduling any surgeries. Also, please make sure to tell us if you have a penicillin allergy as you will require an alternative antibiotic.

## 2022-02-06 NOTE — Progress Notes (Signed)
Pt arrived from cath lab with scant drainage to right groin site. Pt started dry heaving. Right groin started bleeding. Pressure held for 20 minutes. Cath lab notified. Vitals stable. Pedal pulse palpable.

## 2022-02-06 NOTE — Interval H&P Note (Signed)
History and Physical Interval Note:  02/06/2022 6:38 AM  [name]  has presented today for surgery, with the diagnosis of Severe Aortic Stenosis.  The various methods of treatment have been discussed with the patient and family. After consideration of risks, benefits and other options for treatment, the patient has consented to  Procedure(s): Transcatheter Aortic Valve Replacement, Transfemoral (Right) INTRAOPERATIVE TRANSTHORACIC ECHOCARDIOGRAM (N/A) as a surgical intervention.  The patient's history has been reviewed, patient examined, no change in status, stable for surgery.  I have reviewed the patient's chart and labs.  Questions were answered to the patient's satisfaction.     Coralie Common

## 2022-02-06 NOTE — Op Note (Signed)
[facility]   MULTIDISCIPLINARY HEART VALVE TEAM   TAVR OPERATIVE NOTE   Date of Procedure:  02/06/2022  Preoperative Diagnosis: Severe Aortic Stenosis   Postoperative Diagnosis: Same   Procedure:   Transcatheter Aortic Valve Replacement - Percutaneous  Transfemoral Approach  Edwards Sapien 3 Ultra Resilia THV (size 26 mm, serial # [id number])   Co-Surgeons:  Coralie Common, MD and [name], MD  Anesthesiologist:  Jana Half, DO  Echocardiographer:  Marry Guan, MD  Pre-operative Echo Findings: Severe aortic stenosis Normal left ventricular systolic function  Post-operative Echo Findings: No paravalvular leak Normal/unchanged left ventricular systolic function  BRIEF CLINICAL NOTE AND INDICATIONS FOR SURGERY  89 yo male who has developed severe, symptomatic aortic stenosis, NYHA functional class 3 symptoms. Pertinent hx includes permanent pacemaker, CAD s/p CABG with known patency of the LIMA-LAD graft and chronic occlusion of SVG's, and CKD stage 3b.  During the course of the patient's preoperative work up they have been evaluated comprehensively by a multidisciplinary team of specialists coordinated through the [facility] in the [facility].  They have been demonstrated to suffer from symptomatic severe aortic stenosis as noted above. The patient has been counseled extensively as to the relative risks and benefits of all options for the treatment of severe aortic stenosis including long term medical therapy, conventional surgery for aortic valve replacement, and transcatheter aortic valve replacement.  The patient has been independently evaluated in formal cardiac surgical consultation by Dr Lavonna Monarch, who deemed the patient appropriate for TAVR. Based upon review of all of the patient's preoperative diagnostic tests they are felt to be candidate for transcatheter aortic valve replacement using the  transfemoral approach as an alternative to conventional surgery.    Following the decision to proceed with transcatheter aortic valve replacement, a discussion has been held regarding what types of management strategies would be attempted intraoperatively in the event of life-threatening complications, including whether or not the patient would be considered a candidate for the use of cardiopulmonary bypass and/or conversion to open sternotomy for attempted surgical intervention.  The patient has been advised of a variety of complications that might develop peculiar to this approach including but not limited to risks of death, stroke, paravalvular leak, aortic dissection or other major vascular complications, aortic annulus rupture, device embolization, cardiac rupture or perforation, acute myocardial infarction, arrhythmia, heart block or bradycardia requiring permanent pacemaker placement, congestive heart failure, respiratory failure, renal failure, pneumonia, infection, other late complications related to structural valve deterioration or migration, or other complications that might ultimately cause a temporary or permanent loss of functional independence or other long term morbidity.  The patient provides full informed consent for the procedure as described and all questions were answered preoperatively.  DETAILS OF THE OPERATIVE PROCEDURE  PREPARATION:   The patient is brought to the operating room on the above mentioned date and central monitoring was established by the anesthesia team including placement of a radial arterial line. The patient is placed in the supine position on the operating table.  Intravenous antibiotics are administered. The patient is monitored closely throughout the procedure under conscious sedation.    Baseline transthoracic echocardiogram is performed. The patient's chest, abdomen, both groins, and both lower extremities are prepared and draped in a sterile manner. A time  out procedure is performed.   PERIPHERAL ACCESS:   Using ultrasound guidance, femoral arterial and venous access is obtained with placement of 6 Fr sheaths on the left side.  [place] images are digitally captured and stored in the patient's chart. Selective coronary angiography of the left coronary artery is performed with a 5 Fr JL4 catheter. A pigtail diagnostic catheter was passed through the femoral arterial sheath under fluoroscopic guidance into the aortic root.  A temporary transvenous pacemaker catheter was passed through the femoral venous sheath under fluoroscopic guidance into the right ventricle.  The pacemaker was tested to ensure stable lead placement and pacemaker capture. Aortic root angiography was performed in order to determine the optimal angiographic angle for valve deployment.  TRANSFEMORAL ACCESS:  A micropuncture technique is used to access the right femoral artery under fluoroscopic and ultrasound guidance.  2 Perclose devices are deployed at 10' and 2' positions to 'PreClose' the femoral artery. An 8 French sheath is placed and then an Amplatz Superstiff wire is advanced through the sheath. This is changed out for a 14 French transfemoral E-Sheath after progressively dilating over the Superstiff wire.  An AL-2 catheter was used to direct a straight-tip exchange length wire across the native aortic valve into the left ventricle. This was exchanged out for a pigtail catheter and position was confirmed in the LV apex. Simultaneous LV and Ao pressures were recorded.  The pigtail catheter was exchanged for a Safari wire in the LV apex.    BALLOON AORTIC VALVULOPLASTY:  Not performed  TRANSCATHETER HEART VALVE DEPLOYMENT:  An Edwards Sapien 3 transcatheter heart valve (size 26 mm) was prepared and crimped per manufacturer's guidelines, and the proper orientation of the valve is confirmed on the [employer] delivery system. The valve was advanced through the introducer sheath using  normal technique until in an appropriate position in the abdominal aorta beyond the sheath tip. The balloon was then retracted and using the fine-tuning wheel was centered on the valve. The valve was then advanced across the aortic arch using appropriate flexion of the catheter. The valve was carefully positioned across the aortic valve annulus. The Commander catheter was retracted using normal technique. Once final position of the valve has been confirmed by angiographic assessment, the valve is deployed while temporarily holding ventilation and during rapid ventricular pacing to maintain systolic blood pressure < 50 mmHg and pulse pressure < 10 mmHg. The balloon inflation is held for >3 seconds after reaching full deployment volume. Once the balloon has fully deflated the balloon is retracted into the ascending aorta and valve function is assessed using echocardiography. The patient's hemodynamic recovery following valve deployment is good.  The deployment balloon and guidewire are both removed. Echo demostrated acceptable post-procedural gradients, stable mitral valve function, and no aortic insufficiency.    PROCEDURE COMPLETION:  The sheath was removed and femoral artery closure is performed using the 2 previously deployed Perclose devices.  Protamine is administered once femoral arterial repair was complete. The site is clear with no evidence of bleeding or hematoma after the sutures are tightened. The temporary pacemaker and pigtail catheters are removed. Mynx closure is used for contralateral femoral arterial hemostasis for the 6 Fr sheath.  The patient tolerated the procedure well and is transported to the recovery area in stable condition. There were no immediate intraoperative complications. All sponge instrument and needle counts are verified correct at completion of the operation.   The patient received a total of 45 mL of intravenous contrast during the procedure.  Coronary Findings: the left  main is patent with no stenosis. The LAD is patent with severe diffuse >80% stenosis with competitive filling from the LIMA graft visualized.  The first OM has a 50% ostial stenosis with TIMI-3 flow present. The AV circumflex is patent.   LVEDP = 12 mmHg  Final Conclusion: Successful transfemoral TAVR with a 26 mm Edwards Sapien 3 Ultra Resilia Valve. Post-op mean gradient 2 mmHg and no PVL.   [name], MD 02/06/2022 12:36 PM

## 2022-02-06 NOTE — Transfer of Care (Signed)
Immediate Anesthesia Transfer of Care Note  Patient: [name]  Procedure(s) Performed: Transcatheter Aortic Valve Replacement, Transfemoral (Right) INTRAOPERATIVE TRANSTHORACIC ECHOCARDIOGRAM  Patient Location: Cath Lab  Anesthesia Type:MAC  Level of Consciousness: drowsy and patient cooperative  Airway & Oxygen Therapy: Patient Spontanous Breathing and Patient connected to face mask oxygen  Post-op Assessment: Report given to RN and Post -op Vital signs reviewed and stable  Post vital signs: Reviewed and stable  Last Vitals:  Vitals Value Taken Time  BP 111/49 02/06/22 1117  Temp    Pulse 60 02/06/22 1118  Resp 14 02/06/22 1118  SpO2 100 % 02/06/22 1118  Vitals shown include unvalidated device data.  Last Pain:  Vitals:   02/06/22 0804  PainSc: 0-No pain      Patients Stated Pain Goal: 2 (57/32/20 2542)  Complications: There were no known notable events for this encounter.

## 2022-02-06 NOTE — Op Note (Signed)
[facility]   MULTIDISCIPLINARY HEART VALVE TEAM   TAVR OPERATIVE NOTE   Date of Procedure:  02/06/2022  Preoperative Diagnosis: Severe Aortic Stenosis   Postoperative Diagnosis: Same   Procedure:   Transcatheter Aortic Valve Replacement - Percutaneous Right Transfemoral Approach  Edwards Sapien 3 Ultra THV (size 26 mm, model # 9755RSL, serial # [id number])         Left coronary angiogram  Co-Surgeons:  Coralie Common MD and [name], MD     Anesthesiologist:  Lahoma Crocker DO  Echocardiographer:  Tish Men  Pre-operative Echo Findings: Severe aortic stenosis normal left ventricular systolic function  Post-operative Echo Findings: no paravalvular leak normal left ventricular systolic function   BRIEF CLINICAL NOTE AND INDICATIONS FOR SURGERY  Pt with NYHA class III symptoms of severe AS with normal LV function and history of CABG with patent LIMA. Pt has acceptable access for 74m Sapien via the Right femoral approach. Pt with low coronary ostia that I feel is not prohibitive and with his CRI I dont feel that repeat Cath would be needed and that perhaps BAV could determine if heights are an issue to consider protection. Other wise pt should undergo TAVR and that he is not a bailout candidate and he and his wife understand and agree     DETAILS OF THE OPERATIVE PROCEDURE  PREPARATION:    The patient was brought to the operating room on the above mentioned date and appropriate monitoring was established by the anesthesia team. The patient was placed in the supine position on the operating table.  Intravenous antibiotics were administered. The patient was monitored closely throughout the procedure under conscious sedation.    Baseline transthoracic echocardiogram was performed. The patient's abdomen and both groins were prepped and draped in a sterile manner. A time out procedure was performed.   PERIPHERAL ACCESS:    Using the modified Seldinger  technique, femoral arterial and venous access was obtained with placement of 6 Fr sheaths on the left side.  A pigtail diagnostic catheter was passed through the left arterial sheath under fluoroscopic guidance into the aortic root.  A temporary transvenous pacemaker catheter was passed through the left femoral venous sheath under fluoroscopic guidance into the right ventricle.  The pacemaker was tested to ensure stable lead placement and pacemaker capture. Aortic root angiography was performed in order to determine the optimal angiographic angle for valve deployment.  Using a LR4 catheter, Dr CBurt Knackmade a singe shot of the left coronary system and the findings were not prohibitive to performing TAVR.   TRANSFEMORAL ACCESS:   Percutaneous transfemoral access and sheath placement was performed using ultrasound guidance.  The right common femoral artery was cannulated using a micropuncture needle and appropriate location was verified using hand injection angiogram.  A pair of Abbott Perclose percutaneous closure devices were placed and a 6 French sheath replaced into the femoral artery.  The patient was heparinized systemically and ACT verified > 250 seconds.    A 14 Fr transfemoral E-sheath was introduced into the right common femoral artery after progressively dilating over an Amplatz superstiff wire. An AL2 catheter was used to direct a straight-tip exchange length wire across the native aortic valve into the left ventricle. This was exchanged out for a pigtail catheter and position was confirmed in the LV apex. Simultaneous LV and Ao pressures were recorded.   The LVEDP measured 177mg.The pigtail catheter was exchanged for a Safari wire in the LV apex.   BALLOON AORTIC  VALVULOPLASTY:   Was not performed   TRANSCATHETER HEART VALVE DEPLOYMENT:   An Edwards Sapien 3 Ultra transcatheter heart valve (size 26 mm) was prepared and crimped per manufacturer's guidelines, and the proper orientation of  the valve is confirmed on the [employer] delivery system. The valve was advanced through the introducer sheath using normal technique until in an appropriate position in the abdominal aorta beyond the sheath tip. The balloon was then retracted and using the fine-tuning wheel was centered on the valve. The valve was then advanced across the aortic arch using appropriate flexion of the catheter. The valve was carefully positioned across the aortic valve annulus. The Commander catheter was retracted using normal technique. Once final position of the valve has been confirmed by angiographic assessment, the valve is deployed during rapid ventricular pacing to maintain systolic blood pressure < 50 mmHg and pulse pressure < 10 mmHg. The balloon inflation is held for >3 seconds after reaching full deployment volume. Once the balloon has fully deflated the balloon is retracted into the ascending aorta and valve function is assessed using echocardiography. There is felt to be no paravalvular leak and no central aortic insufficiency.  The patient's hemodynamic recovery following valve deployment is good.  The deployment balloon and guidewire are both removed.    PROCEDURE COMPLETION:   The sheath was removed and femoral artery closure performed.  Protamine was administered once femoral arterial repair was complete. The temporary pacemaker, pigtail catheter and femoral sheaths were removed with manual pressure used for venous hemostasis.  A Mynx femoral closure device was utilized following removal of the diagnostic sheath in the left femoral artery.  The patient tolerated the procedure well and is transported to the cath lab recovery area in stable condition. There were no immediate intraoperative complications. All sponge instrument and needle counts are verified correct at completion of the operation.   No blood products were administered during the operation.  The patient received a total of 45 mL of  intravenous contrast during the procedure.   Coralie Common, MD 02/06/2022 11:03 AM

## 2022-02-06 NOTE — Progress Notes (Signed)
  [place] VALVE TEAM  Patient doing well s/p TAVR. He is hemodynamically stable, but hypertensive requiring IV nitro at 16mg. Will give him one dose of IV hydralazine and resume home Novrac '5mg'$  (but give an extra '5mg'$  for total of '10mg'$ ). Try to wean on nitro gtt. Groin sites stable. ECG stable (Has pacer). Plan to DC arterial line and transfer to 4E. Plan for early ambulation after bedrest completed and hopeful discharge over the next 24-48 hours.   KAngelena FormPA-C  [facility]  Pager [phone]

## 2022-02-07 ENCOUNTER — Inpatient Hospital Stay (HOSPITAL_COMMUNITY): Payer: Medicare Other

## 2022-02-07 DIAGNOSIS — Z952 Presence of prosthetic heart valve: Secondary | ICD-10-CM

## 2022-02-07 DIAGNOSIS — I442 Atrioventricular block, complete: Secondary | ICD-10-CM | POA: Diagnosis not present

## 2022-02-07 DIAGNOSIS — I130 Hypertensive heart and chronic kidney disease with heart failure and stage 1 through stage 4 chronic kidney disease, or unspecified chronic kidney disease: Secondary | ICD-10-CM | POA: Diagnosis not present

## 2022-02-07 DIAGNOSIS — Z006 Encounter for examination for normal comparison and control in clinical research program: Secondary | ICD-10-CM | POA: Diagnosis not present

## 2022-02-07 DIAGNOSIS — I350 Nonrheumatic aortic (valve) stenosis: Secondary | ICD-10-CM | POA: Diagnosis not present

## 2022-02-07 LAB — BASIC METABOLIC PANEL
Anion gap: 9 (ref 5–15)
BUN: 34 mg/dL — ABNORMAL HIGH (ref 8–23)
CO2: 20 mmol/L — ABNORMAL LOW (ref 22–32)
Calcium: 9.1 mg/dL (ref 8.9–10.3)
Chloride: 109 mmol/L (ref 98–111)
Creatinine, Ser: 1.59 mg/dL — ABNORMAL HIGH (ref 0.61–1.24)
GFR, Estimated: 40 mL/min — ABNORMAL LOW (ref >60)
Glucose, Bld: 84 mg/dL (ref 70–99)
Potassium: 3.5 mmol/L (ref 3.5–5.1)
Sodium: 138 mmol/L (ref 135–145)

## 2022-02-07 LAB — CBC
HCT: 28.4 % — ABNORMAL LOW (ref 39.0–52.0)
Hemoglobin: 9.3 g/dL — ABNORMAL LOW (ref 13.0–17.0)
MCH: 25.8 pg — ABNORMAL LOW (ref 26.0–34.0)
MCHC: 32.7 g/dL (ref 30.0–36.0)
MCV: 78.7 fL — ABNORMAL LOW (ref 80.0–100.0)
Platelets: 139 10*3/uL — ABNORMAL LOW (ref 150–400)
RBC: 3.61 MIL/uL — ABNORMAL LOW (ref 4.22–5.81)
RDW: 16.2 % — ABNORMAL HIGH (ref 11.5–15.5)
WBC: 6.2 10*3/uL (ref 4.0–10.5)
nRBC: 0 % (ref 0.0–0.2)

## 2022-02-07 LAB — MAGNESIUM: Magnesium: 2.1 mg/dL (ref 1.7–2.4)

## 2022-02-07 MED ORDER — HYDRALAZINE HCL 25 MG PO TABS
25.0000 mg | ORAL_TABLET | Freq: Three times a day (TID) | ORAL | Status: DC
  Administered 2022-02-07 – 2022-02-08 (×3): 25 mg via ORAL
  Filled 2022-02-07 (×3): qty 1

## 2022-02-07 MED ORDER — AMLODIPINE BESYLATE 10 MG PO TABS
10.0000 mg | ORAL_TABLET | Freq: Every day | ORAL | Status: DC
  Administered 2022-02-07 – 2022-02-08 (×2): 10 mg via ORAL
  Filled 2022-02-07 (×2): qty 1

## 2022-02-07 MED ORDER — AMLODIPINE BESYLATE 10 MG PO TABS: 10.0000 mg | ORAL_TABLET | Freq: Every day | ORAL | 6 refills | Status: DC

## 2022-02-07 MED ORDER — LIDOCAINE-EPINEPHRINE 1 %-1:100000 IJ SOLN
30.0000 mL | Freq: Once | INTRAMUSCULAR | Status: AC
  Administered 2022-02-07: 2 mL

## 2022-02-07 MED ORDER — LIDOCAINE-EPINEPHRINE 1 %-1:100000 IJ SOLN
INTRAMUSCULAR | Status: AC
  Filled 2022-02-07: qty 1

## 2022-02-07 MED ORDER — AMLODIPINE BESYLATE 10 MG PO TABS: 5.0000 mg | ORAL_TABLET | Freq: Every day | ORAL | 6 refills | Status: DC

## 2022-02-07 NOTE — Progress Notes (Signed)
Echocardiogram 2D Echocardiogram has been performed.  Fidel Levy 02/07/2022, 9:38 AM

## 2022-02-07 NOTE — Progress Notes (Signed)
CARDIAC REHAB PHASE I   PRE:  Rate/Rhythm: 67 first deg    BP: sitting 170/68    SaO2: 98 RA  MODE:  Ambulation: 200 ft   POST:  Rate/Rhythm: 109 pacing    BP: sitting 178/93     SaO2: 97 RA  Pt c/o soreness getting out of bed. Able to stand with min assist and gait belt, used RW in hall, contact guard. No major c/o walking. To recliner after walk. He briefly walked from RW to recliner (few steps) and was significantly weak and imbalanced. Discussed using RW all the time at home. BP elevated, RN giving meds.   Discussed with pt and wife restrictions, increasing walking at home, and CRPII. Pt receptive. Will refer to [place].  [id number]   Yves Dill BS, ACSM-CEP 02/07/2022 10:08 AM

## 2022-02-07 NOTE — Progress Notes (Signed)
Patient's EKG showing afib. Rate has been controlled thru out the night with patient being asymptomatic.   Notified on call Cardiology. Nitro to stay 3 ml until day team rounds.

## 2022-02-07 NOTE — Progress Notes (Signed)
Unable to get standing weight d/t patient not being able to lift his legs. Bed weight was gotten instead.  Maybe mobility and /or PT could help with this issue.    Nitro increased to 4.5

## 2022-02-07 NOTE — Progress Notes (Signed)
  Transition of Care [facility]) Screening Note   Patient Details  Name: [name] Date of Birth: Sep 21, 1926   Transition of Care [facility]) CM/SW Contact:    Dawayne Patricia, RN Phone Number: 02/07/2022, 11:46 AM    Transition of Care Department [facility]) has reviewed patient and no TOC needs have been identified for transition home. Pt s/p TAVR and to return home, will follow AVS instructions post procedure.

## 2022-02-07 NOTE — Plan of Care (Signed)
  Problem: Activity: Goal: Ability to return to baseline activity level will improve Outcome: Progressing   Problem: Cardiovascular: Goal: Vascular access site(s) Level 0-1 will be maintained Outcome: Progressing

## 2022-02-07 NOTE — Progress Notes (Signed)
Right groin started bleeding again. Hematoma noted. Borders marked. Held pressure for 15 minutes. Hematoma soft. Pulses palpable.    Notified on call cardiology about hematoma and asked if he could come to bedside to assess.   Return call from cardiology. MD will come by as soon as he can to assess.   No change in status.

## 2022-02-07 NOTE — Progress Notes (Signed)
  [place] VALVE TEAM   Patient continues to have bright red blood oozing from groin site.  Blood pressure remains elevated.  We have applied additional manual pressure and prolonged his bedrest.  I will add hydralazine to his meds and continue to monitor patient does not feel comfortable going home today.  Will keep him 1 more night for observation I will inject it with lidocaine and epinephrine tomorrow if still oozing.  Cancel discharge.  Angelena Form PA-C  [facility]

## 2022-02-08 LAB — BASIC METABOLIC PANEL
Anion gap: 7 (ref 5–15)
BUN: 35 mg/dL — ABNORMAL HIGH (ref 8–23)
CO2: 21 mmol/L — ABNORMAL LOW (ref 22–32)
Calcium: 8.8 mg/dL — ABNORMAL LOW (ref 8.9–10.3)
Chloride: 107 mmol/L (ref 98–111)
Creatinine, Ser: 1.85 mg/dL — ABNORMAL HIGH (ref 0.61–1.24)
GFR, Estimated: 34 mL/min — ABNORMAL LOW (ref >60)
Glucose, Bld: 106 mg/dL — ABNORMAL HIGH (ref 70–99)
Potassium: 3.7 mmol/L (ref 3.5–5.1)
Sodium: 135 mmol/L (ref 135–145)

## 2022-02-08 LAB — CBC
HCT: 30 % — ABNORMAL LOW (ref 39.0–52.0)
Hemoglobin: 9.7 g/dL — ABNORMAL LOW (ref 13.0–17.0)
MCH: 25.7 pg — ABNORMAL LOW (ref 26.0–34.0)
MCHC: 32.3 g/dL (ref 30.0–36.0)
MCV: 79.6 fL — ABNORMAL LOW (ref 80.0–100.0)
Platelets: 147 10*3/uL — ABNORMAL LOW (ref 150–400)
RBC: 3.77 MIL/uL — ABNORMAL LOW (ref 4.22–5.81)
RDW: 15.9 % — ABNORMAL HIGH (ref 11.5–15.5)
WBC: 5.6 10*3/uL (ref 4.0–10.5)
nRBC: 0 % (ref 0.0–0.2)

## 2022-02-08 LAB — ECHOCARDIOGRAM COMPLETE
AR max vel: 2.65 cm2
AV Area VTI: 2.58 cm2
AV Area mean vel: 2.59 cm2
AV Mean grad: 7.3 mmHg
AV Peak grad: 13.5 mmHg
Ao pk vel: 1.84 m/s
Area-P 1/2: 3.53 cm2
Calc EF: 60 %
Height: 70 in
MV M vel: 5.78 m/s
MV Peak grad: 133.6 mmHg
Radius: 0.4 cm
S' Lateral: 3.4 cm
Single Plane A2C EF: 55.8 %
Single Plane A4C EF: 63 %
Weight: 2522.06 oz

## 2022-02-08 MED ORDER — HYDRALAZINE HCL 50 MG PO TABS: 50.0000 mg | ORAL_TABLET | Freq: Three times a day (TID) | ORAL | 6 refills | Status: DC

## 2022-02-08 NOTE — Progress Notes (Signed)
Mobility Specialist Progress Note:   02/08/22 0948  Mobility  Activity Ambulated with assistance in hallway  Level of Assistance Contact guard assist, steadying assist  Distance Ambulated (ft) 200 ft  Activity Response Tolerated well  $Mobility charge 1 Mobility   Pt in bed willing to participate in mobility. No complaints of pain. Left in bed with call bell in reach and all needs met.   Gareth Eagle Manuel [place] Mobility Specialist Please contact via [employer] or  Rehab Office at [phone]

## 2022-02-08 NOTE — Plan of Care (Signed)
  Problem: Health Behavior/Discharge Planning: Goal: Ability to safely manage health-related needs after discharge will improve Outcome: Progressing   

## 2022-02-08 NOTE — Progress Notes (Signed)
Patient's wife informed this RN that the patient is having hallucinations. Patient did have a couple last night but was able to be reoriented.   Patient's wife stated that "patient thought that there was someone standing outside the window.  Patient's wife stated that they were going to talk to Dr. Burt Knack in the morning concerning this issue.

## 2022-02-08 NOTE — Discharge Summary (Addendum)
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    Amoxicillin Nausea Only   Atarax [Hydroxyzine Hcl] Nausea And Vomiting   Cephalexin Diarrhea   Ciprofloxacin Nausea Only   Clindamycin Other (See Comments)    Gi upset    Clobetasol Other (See Comments)    Not effective   Codeine Nausea Only and Other (See Comments)    Stomach upset   Fish Allergy Nausea And Vomiting   Fluarix [Influenza Virus Vaccine] Itching   Haemophilus Influenzae Itching   Hydrocodone Nausea Only   Hydrocodone-Acetaminophen Nausea And Vomiting   Hydroxyzine Nausea And Vomiting   Latex Itching and Other (See Comments)    (After flu shot)   Macrobid [Nitrofurantoin Macrocrystal] Nausea Only   Niacin Other (See Comments)    Unknown reaction   Niacin-Lovastatin Er Other (See Comments)    Unsure of reaction. Taking simvastatin at home without problems   Niacin-Lovastatin Er Other (See  Comments)    Reaction not recalled   Nitrofurantoin Other (See Comments)    Upset stomach    Omeprazole Other (See Comments)    Reaction not recalled- stopped by MD   Other Nausea And Vomiting   Tramadol Other (See Comments)    Unknown    Vibramycin [Doxycycline] Other (See Comments)    Reaction not recalled   Adhesive [Tape] Rash   Bactrim [Sulfamethoxazole-Trimethoprim] Rash   Colchicine Rash   Gabapentin Other (See Comments)    Painful pimples on tongue   Nortriptyline Rash    Diagnostic Studies/Procedures    TAVR OPERATIVE NOTE     Date of Procedure:                02/06/2022   Preoperative Diagnosis:      Severe Aortic Stenosis    Postoperative Diagnosis:    Same    Procedure:        Transcatheter Aortic Valve Replacement - Percutaneous  Transfemoral Approach             Edwards Sapien 3 Ultra Resilia THV (size 26 mm, serial # [id number])              Co-Surgeons:                        Coralie Common, MD and [name], MD   Anesthesiologist:  Jana Half, DO   Echocardiographer:              Marry Guan, MD   Pre-operative Echo Findings: Severe aortic stenosis Normal left ventricular systolic function   Post-operative Echo Findings: No paravalvular leak Normal/unchanged left ventricular systolic function   _____________    Echo 02/07/22: completed but pending formal read at the time of discharge   History of Present Illness     [name] is a 89 y.o. male with a history of CABG in 2013 ([facility]) with early graft failure of all of his venous conduits and continued patency of the LIMA to LAD, CKD stage IIIb, chronic diastolic CHF, symptomatic bradycardia s/p PPM, anemia, prostate cancer, HTN, HLD, and severe AS who presented to [facility] on 02/06/22 for planned TAVR.   The patient has a history of RCA stenting. He ultimately was treated with multivessel CABG in 2013 with early graft failure of all of his venous conduits and continued  patency of the mammary artery to LAD graft. He had a nuclear stress test in 2019 demonstrating no significant ischemia. He recently developed DOE and fatigue. Echo 12/19/22 showed EF 55-60%, mod LVH, and severe AS mean grad 26 mmHg, peak grad 40.7 mmHg, AVA 0.81 cm2, DVI 0.27, SVI 37 as well as moderate MAC with moderate MR and moderate TR. Given age, advanced CKD, and well outlined coronary anatomy, it was decided to forgo pre op coronary angiography.   The patient was evaluated by the multidisciplinary valve team and felt to have severe, symptomatic aortic stenosis and to be a suitable candidate for TAVR, which was set up for 02/06/22.   Hospital Course     Consultants: none   Severe AS: s/p successful TAVR with a 26 mm Edwards Sapien 3 Ultra Resilia THV via the TF approach on 02/06/22. Post operative echo completed but pending formal read. Groin sites are stable, right side with some persistent oozing. Redressed before discharge. Continued on home Asprin 58m daily. Walked with cardiac rehab with no issues. Plan for discharge home with close follow up in the outpatient setting next week.   Right groin oozing: had persistent groin oozing and discharge from yesterday was cancelled. Now s/p injection with lido with epi and oozing has resolved.   Hallucinations: he has had these in the past but worsening in the hospital. I think he may have a component of sundowning. Plan for discharge and hopefully this will improve in his home environment.   HTN: BP elevated post operatively requiring IV NTG. I increased home Norvasc from 548mto 1039maily and added hydralazine 39m34mD. BP remained elevated. Will increase hydralazine to 50mg42m and follow.   S/p PPM: followed by Dr. MealoMyles Gipviously Allred).  There was some concern for atrial fibrillation during admission, although I was not convinced in reviewing ECGs and tele. We will continue remote monitoring of his device and if he develops a significant afib  burden we can discuss OAC. BurketKD stage IIIb: creat stable ~1.85 which is within his previous baseline.   Chronic diastolic CHF: appears euvolemic. No changes made. Resume PRN home lasix.   CAD s/p CABG: s/p CABG in 2013 ([facility]) with early graft failure of all of his venous conduits and continued patency of the LIMA to LAD. Continue medical therapy.   Renal lesion: of note, pre TAVR CTs showed a small hypodense left renal cortical lesions, stable size from recent 10/28/2021 CT abdomen. Further characterization with MRI or CT  abdomen without and with IV contrast may be considered as clinically warranted. Pt had PET 12/14 and nothing noted on left side of pelvis. I don't think further work up of this is indicated at this time.   _____________  Discharge Vitals Blood pressure (!) 160/68, pulse 73, temperature 98.3 F (36.8 C), temperature source Oral, resp. rate 20, height 5' 10"  (1.778 m), weight 68.8 kg, SpO2 99 %.  Filed Weights   02/06/22 0645 02/07/22 0604 02/08/22 0144  Weight: 69.9 kg 71.5 kg 68.8 kg     GEN: Well nourished, well developed, in no acute distress HEENT: normal Neck: no JVD or masses Cardiac: RRR; no murmurs, rubs, or gallops,no edema  Respiratory:  clear to auscultation bilaterally, normal work of breathing GI: soft, nontender, nondistended, + BS MS: no deformity or atrophy Skin: warm and dry, no rash.  Groin sites clear without hematoma or ecchymosis. Oozing resolved.  Neuro:  Alert and Oriented x 3, Strength and sensation are intact Psych: euthymic mood, full affect   Labs & Radiologic Studies    CBC Recent Labs    02/07/22 0216 02/08/22 0105  WBC 6.2 5.6  HGB 9.3* 9.7*  HCT 28.4* 30.0*  MCV 78.7* 79.6*  PLT 139* 465*   Basic Metabolic Panel Recent Labs    02/07/22 0216 02/08/22 0105  NA 138 135  K 3.5 3.7  CL 109 107  CO2 20* 21*  GLUCOSE 84 106*  BUN 34* 35*  CREATININE 1.59* 1.85*  CALCIUM 9.1 8.8*  MG 2.1  --    Liver Function  Tests No results for input(s): "AST", "ALT", "ALKPHOS", "BILITOT", "PROT", "ALBUMIN" in the last 72 hours. No results for input(s): "LIPASE", "AMYLASE" in the last 72 hours. Cardiac Enzymes No results for input(s): "CKTOTAL", "CKMB", "CKMBINDEX", "TROPONINI" in the last 72 hours. BNP Invalid input(s): "POCBNP" D-Dimer No results for input(s): "DDIMER" in the last 72 hours. Hemoglobin A1C No results for input(s): "HGBA1C" in the last 72 hours. Fasting Lipid Panel No results for input(s): "CHOL", "HDL", "LDLCALC", "TRIG", "CHOLHDL", "LDLDIRECT" in the last 72 hours. Thyroid Function Tests No results for input(s): "TSH", "T4TOTAL", "T3FREE", "THYROIDAB" in the last 72 hours.  Invalid input(s): "FREET3" _____________  ECHOCARDIOGRAM LIMITED  Result Date: 02/06/2022    ECHOCARDIOGRAM LIMITED REPORT   Patient Name:   [name] Date of Exam: 02/06/2022 Medical Rec #:  [id number]          Height:       70.0 in Accession #:    [id number]         Weight:       154.0 lb Date of Birth:  05-29-1926          BSA:          1.868 m Patient Age:    39 years           BP:           167/91 mmHg Patient Gender: M                  HR:           68 bpm. Exam Location:  Inpatient Procedure: Limited Echo, Limited Color Doppler and Cardiac Doppler Indications:     Aortic Stenosis  History:         Patient has prior history of Echocardiogram examinations, most                  recent 12/18/2021. CHF, Previous Myocardial Infarction  and CAD,                  Pacemaker; Risk Factors:GERD, Hypertension and Dyslipidemia.                  Aortic Valve: 26 mm Sapien prosthetic, stented (TAVR) valve is                  present in the aortic position. Procedure Date: 02/06/2022.  Sonographer:     Bernadene Person RDCS Referring Phys:  [place] Diagnosing Phys: Eleonore Chiquito MD  Sonographer Comments: 21m Edwards Sapien aortic valve placed. IMPRESSIONS  1. 26 mm S3 TAVR. Vmax 1 m/s, MG 2.0 mmHG, EOA 3.64 cm2, DI  0.82. No regurgitation or paravalvular leak. The aortic valve has been repaired/replaced. Aortic valve regurgitation is not visualized. No aortic stenosis is present. There is a 26 mm Sapien prosthetic (TAVR) valve present in the aortic position. Procedure Date: 02/06/2022.  2. Left ventricular ejection fraction, by estimation, is 55 to 60%. The left ventricle has normal function. The left ventricle has no regional wall motion abnormalities.  3. Right ventricular systolic function is normal. The right ventricular size is normal.  4. The mitral valve is degenerative. Mild mitral valve regurgitation. No evidence of mitral stenosis.  5. Tricuspid valve regurgitation is mild to moderate. FINDINGS  Left Ventricle: Left ventricular ejection fraction, by estimation, is 55 to 60%. The left ventricle has normal function. The left ventricle has no regional wall motion abnormalities. Right Ventricle: The right ventricular size is normal. No increase in right ventricular wall thickness. Right ventricular systolic function is normal. Pericardium: There is no evidence of pericardial effusion. Mitral Valve: The mitral valve is degenerative in appearance. Mild mitral valve regurgitation. No evidence of mitral valve stenosis. Tricuspid Valve: The tricuspid valve is grossly normal. Tricuspid valve regurgitation is mild to moderate. No evidence of tricuspid stenosis. Aortic Valve: 26 mm S3 TAVR. Vmax 1 m/s, MG 2.0 mmHG, EOA 3.64 cm2, DI 0.82. No regurgitation or paravalvular leak. The aortic valve has been repaired/replaced. Aortic valve regurgitation is not visualized. Aortic regurgitation PHT measures 433 msec. No aortic stenosis is present. Aortic valve mean gradient measures 2.0 mmHg. Aortic valve peak gradient measures 3.9 mmHg. Aortic valve area, by VTI measures 3.64 cm. There is a 26 mm Sapien prosthetic, stented (TAVR) valve present in the aortic position. Procedure Date: 02/06/2022. Pulmonic Valve: The pulmonic valve was  grossly normal. Pulmonic valve regurgitation is trivial. No evidence of pulmonic stenosis. Additional Comments: Spectral Doppler performed. Color Doppler performed.  LEFT VENTRICLE PLAX 2D LVOT diam:     2.38 cm LV SV:         89 LV SV Index:   47 LVOT Area:     4.45 cm  AORTIC VALVE AV Area (Vmax):    4.53 cm AV Area (Vmean):   1.63 cm AV Area (VTI):     3.64 cm AV Vmax:           98.20 cm/s AV Vmean:          169.150 cm/s AV VTI:            0.243 m AV Peak Grad:      3.9 mmHg AV Mean Grad:      2.0 mmHg LVOT Vmax:         100.00 cm/s LVOT Vmean:        61.800 cm/s LVOT VTI:  0.199 m LVOT/AV VTI ratio: 0.82 AI PHT:            433 msec TRICUSPID VALVE TR Peak grad:   48.2 mmHg TR Vmax:        347.00 cm/s  SHUNTS Systemic VTI:  0.20 m Systemic Diam: 2.38 cm Eleonore Chiquito MD Electronically signed by Eleonore Chiquito MD Signature Date/Time: 02/06/2022/12:40:49 PM    Final    Structural Heart Procedure  Result Date: 02/06/2022 See surgical note for result.  DG Chest 2 View  Result Date: 02/04/2022 CLINICAL DATA:  Preop for aortic valve replacement. EXAM: CHEST - 2 VIEW COMPARISON:  May 23, 2020. FINDINGS: The heart size and mediastinal contours are within normal limits. Status post coronary bypass graft. Left-sided pacemaker is unchanged in position. Small bilateral pleural effusions are noted. No definite consolidative process is noted. The visualized skeletal structures are unremarkable. IMPRESSION: Small bilateral pleural effusions. No definite consolidative process is noted. Electronically Signed   By: Marijo Conception M.D.   On: 02/04/2022 20:33   CT ANGIO ABDOMEN PELVIS  W &/OR WO CONTRAST  Result Date: 01/11/2022 CLINICAL DATA:  Aortic valve replacement (TAVR), pre-op eval. Nonrheumatic aortic valve stenosis. Prostate cancer. * Tracking Code: BO * EXAM: CT ANGIOGRAPHY CHEST, ABDOMEN AND PELVIS TECHNIQUE: Multidetector CT imaging through the chest, abdomen and pelvis was performed using the  standard protocol during bolus administration of intravenous contrast. Multiplanar reconstructed images and MIPs were obtained and reviewed to evaluate the vascular anatomy. RADIATION DOSE REDUCTION: This exam was performed according to the departmental dose-optimization program which includes automated exposure control, adjustment of the mA and/or kV according to patient size and/or use of iterative reconstruction technique. CONTRAST:  48m OMNIPAQUE IOHEXOL 350 MG/ML SOLN COMPARISON:  12/21/2021 PET-CT. 04/25/2020 chest CT. 10/28/2021 CT abdomen/pelvis. FINDINGS: CTA CHEST FINDINGS Cardiovascular: Top-normal heart size. Diffuse thickening and coarse calcification of the aortic valve. Two lead left subclavian pacemaker with lead tips in the right atrium and right ventricular apex. No significant pericardial effusion/thickening. Three-vessel coronary atherosclerosis status post CABG. Atherosclerotic nonaneurysmal thoracic aorta. Normal caliber pulmonary arteries. No central pulmonary emboli. Mediastinum/Nodes: No significant thyroid nodules. Unremarkable esophagus. No pathologically enlarged axillary, mediastinal or hilar lymph nodes. Lungs/Pleura: No pneumothorax. Trace dependent bilateral pleural effusions. A few scattered tiny solid right pulmonary nodules, largest 0.3 cm in the anterior right upper lobe (series 5/image 49), all stable since 04/25/2020 chest CT. No new significant pulmonary nodules. No acute consolidative airspace disease. Musculoskeletal: No aggressive appearing focal osseous lesions. Intact sternotomy wires. Moderate thoracic spondylosis. CTA ABDOMEN AND PELVIS FINDINGS Hepatobiliary: Normal liver with no liver mass. Normal gallbladder with no radiopaque cholelithiasis. No biliary ductal dilatation. Pancreas: Normal, with no mass or duct dilation. Spleen: Mild splenomegaly. Craniocaudal splenic length 13.2 cm. No splenic masses. Adrenals/Urinary Tract: Normal adrenals. No hydronephrosis.  Several simple right renal cysts, largest 4.2 cm in the posterior upper right kidney, for which no follow-up imaging is recommended. Small hypodense left renal cortical lesions, largest 1.1 cm in the lateral interpolar left kidney (series 4/image 130), stable size from recent 10/28/2021 CT abdomen. Normal bladder. Stomach/Bowel: Normal non-distended stomach. Normal caliber small bowel with no small bowel wall thickening. Normal appendix. Normal large bowel with no diverticulosis, large bowel wall thickening or pericolonic fat stranding. Vascular/Lymphatic: Atherosclerotic nonaneurysmal abdominal aorta. No pathologically enlarged lymph nodes in the abdomen or pelvis. Reproductive: Prostatectomy. Other: No pneumoperitoneum, ascites or focal fluid collection. Musculoskeletal: No aggressive appearing focal osseous lesions. Moderate lumbar spondylosis. VASCULAR MEASUREMENTS PERTINENT  TO TAVR: AORTA: Minimal Aortic Diameter-15.1 x 13.8 mm Severity of Aortic Calcification-moderate RIGHT PELVIS: Right Common Iliac Artery - Minimal Diameter-8.8 x 8.6 mm Tortuosity-mild Calcification-moderate Right External Iliac Artery - Minimal Diameter-9.0 x 8.7 mm Tortuosity-mild-to-moderate Calcification-mild Right Common Femoral Artery - Minimal Diameter-9.3 x 8.2 mm Tortuosity-mild Calcification-mild LEFT PELVIS: Left Common Iliac Artery - Minimal Diameter-8.5 x 7.3 mm Tortuosity-mild Calcification-severe Left External Iliac Artery - Minimal Diameter-8.6 x 8.3 mm Tortuosity-moderate Calcification-mild Left Common Femoral Artery - Minimal Diameter-9.7 x 7.6 mm Tortuosity-mild Calcification-moderate Review of the MIP images confirms the above findings. IMPRESSION: 1. Vascular findings and measurements pertinent to potential TAVR procedure, as detailed. 2. Diffuse thickening and coarse calcification of the aortic valve, compatible with the reported history of aortic stenosis. 3. Trace dependent bilateral pleural effusions. 4. Mild  splenomegaly.  No lymphadenopathy. 5. Small hypodense left renal cortical lesions, stable size from recent 10/28/2021 CT abdomen. Further characterization with MRI or CT abdomen without and with IV contrast may be considered as clinically warranted. Electronically Signed   By: Ilona Sorrel M.D.   On: 01/11/2022 08:38   CT ANGIO CHEST AORTA W/CM & OR WO/CM  Result Date: 01/11/2022 CLINICAL DATA:  Aortic valve replacement (TAVR), pre-op eval. Nonrheumatic aortic valve stenosis. Prostate cancer. * Tracking Code: BO * EXAM: CT ANGIOGRAPHY CHEST, ABDOMEN AND PELVIS TECHNIQUE: Multidetector CT imaging through the chest, abdomen and pelvis was performed using the standard protocol during bolus administration of intravenous contrast. Multiplanar reconstructed images and MIPs were obtained and reviewed to evaluate the vascular anatomy. RADIATION DOSE REDUCTION: This exam was performed according to the departmental dose-optimization program which includes automated exposure control, adjustment of the mA and/or kV according to patient size and/or use of iterative reconstruction technique. CONTRAST:  24m OMNIPAQUE IOHEXOL 350 MG/ML SOLN COMPARISON:  12/21/2021 PET-CT. 04/25/2020 chest CT. 10/28/2021 CT abdomen/pelvis. FINDINGS: CTA CHEST FINDINGS Cardiovascular: Top-normal heart size. Diffuse thickening and coarse calcification of the aortic valve. Two lead left subclavian pacemaker with lead tips in the right atrium and right ventricular apex. No significant pericardial effusion/thickening. Three-vessel coronary atherosclerosis status post CABG. Atherosclerotic nonaneurysmal thoracic aorta. Normal caliber pulmonary arteries. No central pulmonary emboli. Mediastinum/Nodes: No significant thyroid nodules. Unremarkable esophagus. No pathologically enlarged axillary, mediastinal or hilar lymph nodes. Lungs/Pleura: No pneumothorax. Trace dependent bilateral pleural effusions. A few scattered tiny solid right pulmonary nodules,  largest 0.3 cm in the anterior right upper lobe (series 5/image 49), all stable since 04/25/2020 chest CT. No new significant pulmonary nodules. No acute consolidative airspace disease. Musculoskeletal: No aggressive appearing focal osseous lesions. Intact sternotomy wires. Moderate thoracic spondylosis. CTA ABDOMEN AND PELVIS FINDINGS Hepatobiliary: Normal liver with no liver mass. Normal gallbladder with no radiopaque cholelithiasis. No biliary ductal dilatation. Pancreas: Normal, with no mass or duct dilation. Spleen: Mild splenomegaly. Craniocaudal splenic length 13.2 cm. No splenic masses. Adrenals/Urinary Tract: Normal adrenals. No hydronephrosis. Several simple right renal cysts, largest 4.2 cm in the posterior upper right kidney, for which no follow-up imaging is recommended. Small hypodense left renal cortical lesions, largest 1.1 cm in the lateral interpolar left kidney (series 4/image 130), stable size from recent 10/28/2021 CT abdomen. Normal bladder. Stomach/Bowel: Normal non-distended stomach. Normal caliber small bowel with no small bowel wall thickening. Normal appendix. Normal large bowel with no diverticulosis, large bowel wall thickening or pericolonic fat stranding. Vascular/Lymphatic: Atherosclerotic nonaneurysmal abdominal aorta. No pathologically enlarged lymph nodes in the abdomen or pelvis. Reproductive: Prostatectomy. Other: No pneumoperitoneum, ascites or focal fluid collection. Musculoskeletal: No aggressive appearing  focal osseous lesions. Moderate lumbar spondylosis. VASCULAR MEASUREMENTS PERTINENT TO TAVR: AORTA: Minimal Aortic Diameter-15.1 x 13.8 mm Severity of Aortic Calcification-moderate RIGHT PELVIS: Right Common Iliac Artery - Minimal Diameter-8.8 x 8.6 mm Tortuosity-mild Calcification-moderate Right External Iliac Artery - Minimal Diameter-9.0 x 8.7 mm Tortuosity-mild-to-moderate Calcification-mild Right Common Femoral Artery - Minimal Diameter-9.3 x 8.2 mm Tortuosity-mild  Calcification-mild LEFT PELVIS: Left Common Iliac Artery - Minimal Diameter-8.5 x 7.3 mm Tortuosity-mild Calcification-severe Left External Iliac Artery - Minimal Diameter-8.6 x 8.3 mm Tortuosity-moderate Calcification-mild Left Common Femoral Artery - Minimal Diameter-9.7 x 7.6 mm Tortuosity-mild Calcification-moderate Review of the MIP images confirms the above findings. IMPRESSION: 1. Vascular findings and measurements pertinent to potential TAVR procedure, as detailed. 2. Diffuse thickening and coarse calcification of the aortic valve, compatible with the reported history of aortic stenosis. 3. Trace dependent bilateral pleural effusions. 4. Mild splenomegaly.  No lymphadenopathy. 5. Small hypodense left renal cortical lesions, stable size from recent 10/28/2021 CT abdomen. Further characterization with MRI or CT abdomen without and with IV contrast may be considered as clinically warranted. Electronically Signed   By: Ilona Sorrel M.D.   On: 01/11/2022 08:38   CT CORONARY MORPH W/CTA COR W/SCORE W/CA W/CM &/OR WO/CM  Addendum Date: 01/11/2022   ADDENDUM REPORT: 01/11/2022 08:09 EXAM: OVER-READ INTERPRETATION  CT CHEST The following report is an over-read performed by radiologist Dr. Samara Snide [facility] on 01/11/2022. This over-read does not include interpretation of cardiac or coronary anatomy or pathology. The cardiac CTA interpretation by the cardiologist is attached. COMPARISON:  02/14/2011 chest CT. FINDINGS: Please see the separate concurrent chest CT angiogram report for details. IMPRESSION: Please see the separate concurrent chest CT angiogram report for details. Electronically Signed   By: Ilona Sorrel M.D.   On: 01/11/2022 08:09   Result Date: 01/11/2022 CLINICAL DATA:  79M with severe aortic stenosis being evaluated for a TAVR procedure. EXAM: Cardiac TAVR CT TECHNIQUE: The patient was scanned on a [employer]. A 120 kV retrospective scan was triggered in the descending  thoracic aorta at 111 HU's. Gantry rotation speed was 250 msecs and collimation was .6 mm. No beta blockade or nitro were given. The 3D data set was reconstructed in 5% intervals of the R-R cycle. Systolic and diastolic phases were analyzed on a dedicated work station using MPR, MIP and VRT modes. The patient received 100 cc of contrast. FINDINGS: Aortic Root: Aortic valve: Trileaflet Aortic valve calcium score: 1870 Aortic annulus: Diameter: 24m x 250mPerimeter: 8076mrea: 497 mm^2 Calcifications: No calcifications Coronary height: Min Left - 57m25min Right - 57mm39motubular height: Left cusp - 18mm;60mht cusp - 17mm; 25moronary cusp - 21mm LV17mas measured 3 mm below the annulus): Diameter: 30mm x 279mArea77m1 mm^2 Calcifications: No calcifications Aortic sinus width: Left cusp - 31mm; Righ66msp - 30mm; Nonco40mry cusp - 32mm Sinotub73m junction width: 29mm x 27mm O22mum F49moscopic Angle for Delivery: LAO 2 CAU 18 Cardiac: Right atrium: Mild enlargement Right ventricle: Normal size Pulmonary arteries: Normal size Pulmonary veins: Normal configuration Left atrium: Mild enlargement.  PFO Left ventricle: Moderate hypertrophy. Normal size. Basal to mid inferior akinesis. Pericardium: Normal thickness Coronary arteries: S/p CABG with patent LIMA-LAD. SVG-PDA and SVG-OM1 are occluded IMPRESSION: 1. Trileaflet aortic valve with moderate calcifications (AV calcium score 1870) 2. Aortic annulus measures 28mm x 24mm in 18meter44mh perimeter 80mm and area 49715m. No annular39mLVOT calcifications. Annular measurements are suitable for delivery of 26mm Edwards Sapien49m  valve. 3.  Low coronary height to left main (47m) and RCA (154m 4.  Optimum Fluoroscopic Angle for Delivery:  LAO 2 CAU 18 5.  S/p CABG with patent LIMA-LAD.  SVG-PDA and SVG-OM1 are occluded 6.  Basal to mid LV inferior wall akinesis. Electronically Signed: By: ChOswaldo Milian.D. On: 01/10/2022 12:18   CUP PACEART REMOTE DEVICE  CHECK  Result Date: 01/09/2022 Scheduled remote reviewed. Normal device function.  8 AMS, all < 84m684mNext remote 91 days- JJB  Disposition   Pt is being discharged home today in good condition.  Follow-up Plans & Appointments     Follow-up Information     McDTommie RaymondP. Go on 02/14/2022.   Specialty: Cardiology Why: @11  am, please arrive at least 10 minutes early. Contact information: [address] [place] [phone]             Discharge Instructions     Amb Referral to Cardiac Rehabilitation   Complete by: As directed    Diagnosis: Valve Replacement   Valve: Aortic Comment - TAVR   After initial evaluation and assessments completed: Virtual Based Care may be provided alone or in conjunction with Phase 2 Cardiac Rehab based on patient barriers.: Yes   Intensive Cardiac [facility] (ICR) [facility] only OR Traditional Cardiac Rehabilitation (TCR) *If criteria for ICR are not met will enroll in [facility] Yes       Discharge Medications   Allergies as of 02/08/2022       Reactions   Aspirin Nausea And Vomiting, Other (See Comments)   Upset stomach- tolerates coated aspirin   Lisinopril Anaphylaxis, Shortness Of Breath   After 3 tablets, he experienced trouble swallowing, throat tightness and hoarseness.    Amoxicillin Nausea Only   Atarax [hydroxyzine Hcl] Nausea And Vomiting   Cephalexin Diarrhea   Ciprofloxacin Nausea Only   Clindamycin Other (See Comments)   Gi upset    Clobetasol Other (See Comments)   Not effective   Codeine Nausea Only, Other (See Comments)   Stomach upset   Fish Allergy Nausea And Vomiting   Fluarix [influenza Virus Vaccine] Itching   Haemophilus Influenzae Itching   Hydrocodone Nausea Only   Hydrocodone-acetaminophen Nausea And Vomiting   Hydroxyzine Nausea And Vomiting   Latex Itching, Other (See Comments)   (After flu shot)   Macrobid [nitrofurantoin Macrocrystal] Nausea Only    Niacin Other (See Comments)   Unknown reaction   Niacin-lovastatin Er Other (See Comments)   Unsure of reaction. Taking simvastatin at home without problems   Niacin-lovastatin Er Other (See Comments)   Reaction not recalled   Nitrofurantoin Other (See Comments)   Upset stomach   Omeprazole Other (See Comments)   Reaction not recalled- stopped by MD   Other Nausea And Vomiting   Tramadol Other (See Comments)   Unknown    Vibramycin [doxycycline] Other (See Comments)   Reaction not recalled   Adhesive [tape] Rash   Bactrim [sulfamethoxazole-trimethoprim] Rash   Colchicine Rash   Gabapentin Other (See Comments)   Painful pimples on tongue   Nortriptyline Rash        Medication List     TAKE these medications    acetaminophen 500 MG tablet Commonly known as: TYLENOL Take 1,000 mg by mouth 3 (three) times daily as needed for moderate pain.   allopurinol 100 MG tablet Commonly known as: ZYLOPRIM TAKE 1 TABLET(100 MG) BY MOUTH DAILY What changed:  how much to take how to take this when to take this   amLODipine 10 MG tablet Commonly known as: NORVASC Take 1 tablet (10 mg total) by mouth daily. What changed:  medication strength how much to take   aspirin EC 81 MG tablet Take 81 mg by mouth daily.   AYR NASAL MIST ALLERGY/SINUS NA Place 2 sprays into the nose as needed (dryness).   Cholecalciferol 25 MCG (1000 UT) capsule Take 1,000 Units by mouth daily.   CITRACAL + D PO Take 2 tablets by mouth in the morning and at bedtime.   cyanocobalamin 1000 MCG/ML injection Commonly known as: VITAMIN B12 Inject 1,000 mcg into the muscle once. Monthly injection   folic acid 1 MG tablet Commonly known as: FOLVITE Take 1 tablet (1 mg total) by mouth daily. Annual appt due in May must see provider for future refills   furosemide 40 MG tablet Commonly known as: LASIX TAKE 1 TABLET BY MOUTH EVERY DAY What changed:  when to take this reasons to take this    hydrALAZINE 50 MG tablet Commonly known as: APRESOLINE Take 1 tablet (50 mg total) by mouth 3 (three) times daily.   loratadine 10 MG tablet Commonly known as: CLARITIN Take 10 mg by mouth daily as needed (for seasonal allergies).   nitroGLYCERIN 0.4 MG SL tablet Commonly known as: NITROSTAT DISSOLVE 1 TABLET UNDER THE TONGUE EVERY 5 MINUTES AS NEEDED FOR CHEST PAIN, MAXIMUM 3 TABLETS What changed:  how much to take how to take this when to take this reasons to take this additional instructions   PRESERVISION AREDS 2 PO Take 1 tablet by mouth daily.   REFRESH OPTIVE MEGA-3 OP Place 1 drop into both eyes 2 (two) times daily.   VICKS VAPOR INHALER IN Place 1 puff into both nostrils as needed (for congestion).   vitamin C 1000 MG tablet Take 1,000 mg by mouth daily.   Vitamin D (Ergocalciferol) 1.25 MG (50000 UNIT) Caps capsule Commonly known as: DRISDOL Take by mouth.           Outstanding Labs/Studies   none  Duration of Discharge Encounter   Greater than 30 minutes including physician time.  Mable Fill, PA-C 02/08/2022, 8:51 AM [phone]  Patient seen, examined. Available data reviewed. Agree with findings, assessment, and plan as outlined by Nell Range, PA-C. The patient is alert, oriented, in NAD. Lungs CTA, heart RRR no murmur, abd: soft, NT, BL groin sites clear, no edema. Tele shows a paced rhythm. Pt medically stable for DC today. Medications and follow-up plans reviewed - in agreement.   [name], M.D. 02/08/2022 10:14 PM

## 2022-02-08 NOTE — Plan of Care (Signed)
DISCHARGE NOTE HOME [name] to be discharged home per MD order. Discussed prescriptions and follow up appointments with the patient. Medication list explained in detail. Patient verbalized understanding.  Skin clean, dry and intact without evidence of skin break down, no evidence of skin tears noted. IV catheter discontinued intact. Site without signs and symptoms of complications. Dressing and pressure applied. Pt denies pain at the site currently. No complaints noted.  An After Visit Summary (AVS) was printed and given to the patient. Patient to be escorted via wheelchair, and discharged home via private auto.  Stephan Minister, RN

## 2022-02-09 ENCOUNTER — Encounter: Payer: Self-pay | Admitting: *Deleted

## 2022-02-09 ENCOUNTER — Telehealth: Payer: Self-pay | Admitting: Physician Assistant

## 2022-02-09 ENCOUNTER — Telehealth: Payer: Self-pay | Admitting: *Deleted

## 2022-02-09 LAB — TYPE AND SCREEN
ABO/RH(D): O POS
Antibody Screen: POSITIVE
PT AG Type: NEGATIVE
Unit division: 0
Unit division: 0

## 2022-02-09 LAB — BPAM RBC
Blood Product Expiration Date: 202403012359
Blood Product Expiration Date: 202403012359
ISSUE DATE / TIME: 202401240907
ISSUE DATE / TIME: 202401240907
Unit Type and Rh: 5100
Unit Type and Rh: 5100

## 2022-02-09 NOTE — Telephone Encounter (Signed)
Patient contacted regarding discharge from [facility] on 02/08/2022.  Patient understands to follow up with provider [facility] APP on 02/16/2022 at 11:00 AM at [facility] office. Patient understands discharge instructions? yes Patient understands medications and regiment? yes Patient understands to bring all medications to this visit? Yes  I spoke with the pt's wife and she said the pt is doing well.  She said he just laid back down to rest.  I advised her to contact the office if the pt has any questions or concerns.

## 2022-02-09 NOTE — Progress Notes (Signed)
Remote pacemaker transmission.   

## 2022-02-09 NOTE — Telephone Encounter (Signed)
  [facility]   MULTIDISCIPLINARY HEART VALVE TEAM   Attempted TOC call. Left VM to call back.   Angelena Form PA-C  [facility]

## 2022-02-09 NOTE — Patient Outreach (Signed)
Care Coordination [facility] Note Transition Care Management Follow-up Telephone Call Date of discharge and from where: Thursday, 02/08/22 [name]; planned TAVR How have you been since you were released from the hospital? Per caregiver/ spouse [name], on [facility] DPR: "He is doing okay after the surgery, still just sleeping and resting a lot like he was before the surgery, that's nothing new.  They called from the cardiology office today and made sure everything was going okay and double checked his medications.  We have made all the changes to his medications like they told us to; I can't go over all of his medication right now, because the plumber is here working.... but I can tell you, we have reviewed the medications and he is taking everything like they told us to.  I agree with you, I will start periodically checking his blood pressures here at home since they have increased his doses on his blood pressure medications" Any questions or concerns? No -- Advised caregiver to periodically monitor blood pressures at home secondary to her report that patient is tired and sleeping a lot in setting of newly added blood pressure medicine as well as increased dosing of amlodipine  Items Reviewed: Did the pt receive and understand the discharge instructions provided? Yes  thoroughly reviewed AVS discharge instructions with spouse; she verbalizes very good understanding of same and denies questions/ concerns  Medications obtained and verified? Yes  confirmed patient obtained and is taking newly prescribed medications post-hospital discharge yesterday; spouse assists with medication management as indicated; she denies questions/ concerns around medications and verbalizes a good understanding of all medication changes post-recent surgery Other? No  Any new allergies since your discharge? No  Dietary orders reviewed? Yes Do you have support at home? Yes   Home Care and Equipment/Supplies: Were home health  services ordered? no If so, what is the name of the agency? N/A  Has the agency set up a time to come to the patient's home? not applicable Were any new equipment or medical supplies ordered?  No What is the name of the medical supply agency? N/A Were you able to get the supplies/equipment? not applicable Do you have any questions related to the use of the equipment or supplies? No N/A  Functional Questionnaire: (I = Independent and D = Dependent) ADLs: I  Bathing/Dressing- I  Meal Prep- I  Eating- I  Maintaining continence- I  Transferring/Ambulation- I  Managing Meds- I  Follow up appointments reviewed:  PCP Hospital f/u appt confirmed? No  Scheduled to see - on - @ - verified no recommendation for PCP office visit post- recent planned surgery: cardiology provider visit was only recommendation [facility] f/u appt confirmed? Yes  Scheduled to see cardiology provider on Friday 02/16/22 @ 11:00 am Are transportation arrangements needed? No  If their condition worsens, is the pt aware to call PCP or go to the Emergency Dept.? Yes Was the patient provided with contact information for the PCP's office or ED? No- declined; reports already has contact information for all care providers Was to pt encouraged to call back with questions or concerns? Yes- provided my direct contact information should questions/ concerns/ need arise post-TOC call today  SDOH assessments and interventions completed:   Yes SDOH Interventions Today    Flowsheet Row Most Recent Value  SDOH Interventions   Food Insecurity Interventions Intervention Not Indicated  Transportation Interventions Intervention Not Indicated  [spouse provides transportation]      Care Coordination Interventions:  Provided education  around value of monitoring blood pressures at home in setting of newly prescribed blood pressure medication, along with increased dosing of routine blood pressure medication and encouraged  spouse to monitor blood pressures at home and take to upcoming cardiology provider office visit    Encounter Outcome:  Pt. Visit Completed    Oneta Rack, RN, BSN, CCRN Alumnus RN CM Care Coordination/ Transition of [place] Management [phone]: direct office

## 2022-02-12 LAB — POCT ACTIVATED CLOTTING TIME
Activated Clotting Time: 154 seconds
Activated Clotting Time: 143 seconds
Activated Clotting Time: 258 seconds

## 2022-02-13 ENCOUNTER — Emergency Department (HOSPITAL_COMMUNITY)
Admission: EM | Admit: 2022-02-13 | Discharge: 2022-02-13 | Disposition: A | Discharge status: Home / Self Care | Payer: Medicare Other | Attending: Emergency Medicine | Admitting: Emergency Medicine

## 2022-02-13 ENCOUNTER — Emergency Department (HOSPITAL_COMMUNITY): Payer: Medicare Other

## 2022-02-13 ENCOUNTER — Telehealth: Payer: Self-pay

## 2022-02-13 DIAGNOSIS — R252 Cramp and spasm: Secondary | ICD-10-CM | POA: Insufficient documentation

## 2022-02-13 DIAGNOSIS — Z950 Presence of cardiac pacemaker: Secondary | ICD-10-CM | POA: Diagnosis not present

## 2022-02-13 DIAGNOSIS — Z7982 Long term (current) use of aspirin: Secondary | ICD-10-CM | POA: Diagnosis not present

## 2022-02-13 DIAGNOSIS — Z91040 Latex allergy status: Secondary | ICD-10-CM | POA: Insufficient documentation

## 2022-02-13 DIAGNOSIS — R251 Tremor, unspecified: Secondary | ICD-10-CM

## 2022-02-13 DIAGNOSIS — I2510 Atherosclerotic heart disease of native coronary artery without angina pectoris: Secondary | ICD-10-CM | POA: Insufficient documentation

## 2022-02-13 DIAGNOSIS — N189 Chronic kidney disease, unspecified: Secondary | ICD-10-CM | POA: Diagnosis not present

## 2022-02-13 DIAGNOSIS — Z79899 Other long term (current) drug therapy: Secondary | ICD-10-CM | POA: Diagnosis not present

## 2022-02-13 LAB — CBC WITH DIFFERENTIAL/PLATELET
Abs Immature Granulocytes: 0.02 10*3/uL (ref 0.00–0.07)
Basophils Absolute: 0.1 10*3/uL (ref 0.0–0.1)
Basophils Relative: 2 %
Eosinophils Absolute: 0.3 10*3/uL (ref 0.0–0.5)
Eosinophils Relative: 5 %
HCT: 29.9 % — ABNORMAL LOW (ref 39.0–52.0)
Hemoglobin: 9.9 g/dL — ABNORMAL LOW (ref 13.0–17.0)
Immature Granulocytes: 0 %
Lymphocytes Relative: 18 %
Lymphs Abs: 1 10*3/uL (ref 0.7–4.0)
MCH: 26.3 pg (ref 26.0–34.0)
MCHC: 33.1 g/dL (ref 30.0–36.0)
MCV: 79.5 fL — ABNORMAL LOW (ref 80.0–100.0)
Monocytes Absolute: 0.6 10*3/uL (ref 0.1–1.0)
Monocytes Relative: 10 %
Neutro Abs: 3.6 10*3/uL (ref 1.7–7.7)
Neutrophils Relative %: 65 %
Platelets: 229 10*3/uL (ref 150–400)
RBC: 3.76 MIL/uL — ABNORMAL LOW (ref 4.22–5.81)
RDW: 15.9 % — ABNORMAL HIGH (ref 11.5–15.5)
WBC: 5.4 10*3/uL (ref 4.0–10.5)
nRBC: 0 % (ref 0.0–0.2)

## 2022-02-13 LAB — COMPREHENSIVE METABOLIC PANEL
ALT: 11 U/L (ref 0–44)
AST: 20 U/L (ref 15–41)
Albumin: 3.8 g/dL (ref 3.5–5.0)
Alkaline Phosphatase: 53 U/L (ref 38–126)
Anion gap: 12 (ref 5–15)
BUN: 38 mg/dL — ABNORMAL HIGH (ref 8–23)
CO2: 20 mmol/L — ABNORMAL LOW (ref 22–32)
Calcium: 9.8 mg/dL (ref 8.9–10.3)
Chloride: 105 mmol/L (ref 98–111)
Creatinine, Ser: 1.98 mg/dL — ABNORMAL HIGH (ref 0.61–1.24)
GFR, Estimated: 31 mL/min — ABNORMAL LOW (ref >60)
Glucose, Bld: 96 mg/dL (ref 70–99)
Potassium: 4.1 mmol/L (ref 3.5–5.1)
Sodium: 137 mmol/L (ref 135–145)
Total Bilirubin: 0.8 mg/dL (ref 0.3–1.2)
Total Protein: 6.8 g/dL (ref 6.5–8.1)

## 2022-02-13 LAB — LACTIC ACID, PLASMA: Lactic Acid, Venous: 0.8 mmol/L (ref 0.5–1.9)

## 2022-02-13 LAB — MAGNESIUM: Magnesium: 2.3 mg/dL (ref 1.7–2.4)

## 2022-02-13 NOTE — ED Triage Notes (Signed)
Pt bib EMS from home. Pt reports tremors and leg cramps x 1 hour. Has been experiencing these symptoms intermit x couple years. Pt denies chest pain, dizziness. Recently heart valve surgery and discharge from hospital on Thursday.

## 2022-02-13 NOTE — Telephone Encounter (Signed)
The pt called into the office with complaints of his body jerking uncontrollably.  His feet and legs are moving as if he is "dancing a jig" and he is complaining of a tightening in his leg muscles.  The patient had numbness in his feet and legs earlier today and in the last 15 minutes he has started having involuntary movements. At the end of our call the pt had to give the phone to his wife due to his body jerking uncontrollably.  I advised her to hang up and call 911 immediately.  She agreed.

## 2022-02-13 NOTE — ED Provider Notes (Signed)
[facility] Provider Note   CSN: [id number] Arrival date & time: 02/13/22  1659     History  Chief Complaint  Patient presents with   Tremors    [name] is a 89 y.o. male.  HPI Patient presents via EMS with concern for tremors, leg cramps.  History is obtained by those individuals and the patient himself.  History is notable for recent valve repair as well, he was discharged from this facility last week.  He notes that he has had tremors, spasms occasionally for some time, but given the recent procedure he called for evaluation. EMS reports no hemodynamic instability en route, patient was awake, alert. EMS rhythm strip en route with variable rhythm, intraventricular conduction delay    Home Medications Prior to Admission medications   Medication Sig Start Date End Date Taking? Authorizing Provider  acetaminophen (TYLENOL) 500 MG tablet Take 1,000 mg by mouth 3 (three) times daily as needed for moderate pain.    [provider]  allopurinol (ZYLOPRIM) 100 MG tablet TAKE 1 TABLET(100 MG) BY MOUTH DAILY Patient taking differently: Take 100 mg by mouth daily. TAKE 1 TABLET(100 MG) BY MOUTH DAILY 12/04/21   [name], MD  amLODipine (NORVASC) 10 MG tablet Take 1 tablet (10 mg total) by mouth daily. 02/07/22   Eileen Stanford, PA-C  Aromatic Inhalants (VICKS VAPOR INHALER IN) Place 1 puff into both nostrils as needed (for congestion).    [provider]  Ascorbic Acid (VITAMIN C) 1000 MG tablet Take 1,000 mg by mouth daily.    [provider]  aspirin EC 81 MG tablet Take 81 mg by mouth daily.    [provider]  Calcium Citrate-Vitamin D (CITRACAL + D PO) Take 2 tablets by mouth in the morning and at bedtime.    [provider]  Carboxymeth-Glycerin-Polysorb (REFRESH OPTIVE MEGA-3 OP) Place 1 drop into both eyes 2 (two) times daily.    [provider]  Cholecalciferol 25  MCG (1000 UT) capsule Take 1,000 Units by mouth daily.    [provider]  cyanocobalamin (,VITAMIN B-12,) 1000 MCG/ML injection Inject 1,000 mcg into the muscle once. Monthly injection    [provider]  folic acid (FOLVITE) 1 MG tablet Take 1 tablet (1 mg total) by mouth daily. Annual appt due in May must see provider for future refills 12/04/21   [name], MD  furosemide (LASIX) 40 MG tablet TAKE 1 TABLET BY MOUTH EVERY DAY Patient taking differently: Take 40 mg by mouth as needed for fluid or edema. 04/05/21   [name], MD  hydrALAZINE (APRESOLINE) 50 MG tablet Take 1 tablet (50 mg total) by mouth 3 (three) times daily. 02/08/22   Eileen Stanford, PA-C  loratadine (CLARITIN) 10 MG tablet Take 10 mg by mouth daily as needed (for seasonal allergies).    [provider]  Multiple Vitamins-Minerals (PRESERVISION AREDS 2 PO) Take 1 tablet by mouth daily.    [provider]  nitroGLYCERIN (NITROSTAT) 0.4 MG SL tablet DISSOLVE 1 TABLET UNDER THE TONGUE EVERY 5 MINUTES AS NEEDED FOR CHEST PAIN, MAXIMUM 3 TABLETS Patient taking differently: Place 0.4 mg under the tongue every 5 (five) minutes as needed for chest pain. 04/30/19   [name], MD  Saline (AYR NASAL MIST ALLERGY/SINUS NA) Place 2 sprays into the nose as needed (dryness).    [provider]  Vitamin D, Ergocalciferol, (DRISDOL) 1.25 MG (50000  UNIT) CAPS capsule Take by mouth. Patient not taking: Reported on 02/02/2022 07/13/21   [provider]      Allergies    Aspirin, Lisinopril, Amoxicillin, Atarax [hydroxyzine hcl], Cephalexin, Ciprofloxacin, Clindamycin, Clobetasol, Codeine, Fish allergy, Fluarix [influenza virus vaccine], Haemophilus influenzae, Hydrocodone, Hydrocodone-acetaminophen, Hydroxyzine, Latex, Macrobid [nitrofurantoin macrocrystal], Niacin, Niacin-lovastatin er, Niacin-lovastatin er, Nitrofurantoin, Omeprazole, Other, Tramadol, Vibramycin [doxycycline],  Adhesive [tape], Bactrim [sulfamethoxazole-trimethoprim], Colchicine, Gabapentin, and Nortriptyline    Review of Systems   Review of Systems  All other systems reviewed and are negative.   Physical Exam Updated Vital Signs BP (!) 153/60   Pulse 65   Temp 98 F (36.7 C) (Oral)   Resp 16   Ht 5' 9.69" (1.77 m)   Wt 74.4 kg   SpO2 98%   BMI 23.74 kg/m  Physical Exam Vitals and nursing note reviewed.  Constitutional:      General: He is not in acute distress.    Appearance: He is well-developed.  HENT:     Head: Normocephalic and atraumatic.  Eyes:     Conjunctiva/sclera: Conjunctivae normal.  Cardiovascular:     Rate and Rhythm: Normal rate and regular rhythm.  Pulmonary:     Effort: Pulmonary effort is normal. No respiratory distress.     Breath sounds: No stridor.  Abdominal:     General: There is no distension.  Skin:    General: Skin is warm and dry.  Neurological:     Mental Status: He is alert and oriented to person, place, and time.     ED Results / Procedures / Treatments   Labs (all labs ordered are listed, but only abnormal results are displayed) Labs Reviewed  COMPREHENSIVE METABOLIC PANEL - Abnormal; Notable for the following components:      Result Value   CO2 20 (*)    BUN 38 (*)    Creatinine, Ser 1.98 (*)    GFR, Estimated 31 (*)    All other components within normal limits  CBC WITH DIFFERENTIAL/PLATELET - Abnormal; Notable for the following components:   RBC 3.76 (*)    Hemoglobin 9.9 (*)    HCT 29.9 (*)    MCV 79.5 (*)    RDW 15.9 (*)    All other components within normal limits  LACTIC ACID, PLASMA  MAGNESIUM    EKG EKG Interpretation  Date/Time:  Tuesday February 13 2022 17:38:32 EST Ventricular Rate:  67 PR Interval:  236 QRS Duration: 160 QT Interval:  473 QTC Calculation: 500 R Axis:   -82 Text Interpretation: Sinus or ectopic atrial rhythm Prolonged PR interval RBBB and LAFB Probable anterolateral infarct, old Abnormal  ekg Confirmed by Carmin Muskrat [phone]) on 02/13/2022 5:56:18 PM  Radiology DG Chest 2 View  Result Date: 02/13/2022 CLINICAL DATA:  Tremors.  Status post valve replacement. EXAM: CHEST - 2 VIEW COMPARISON:  Chest radiograph dated 02/02/2022. FINDINGS: There is blunting of the posterior costophrenic angles on the lateral view consistent with small pleural effusions. There is associated atelectasis of the lung bases. No pneumothorax. Mild cardiomegaly. Median sternotomy wires, CABG vascular clips, and aortic valve with placement. Left pectoral pacemaker device. No acute osseous pathology. Degenerative changes of the spine. IMPRESSION: Small bilateral pleural effusions with associated atelectasis. Electronically Signed   By: Anner Crete M.D.   On: 02/13/2022 17:44    Procedures Procedures    Medications Ordered in ED Medications - No data to display  ED Course/ Medical Decision Making/ A&P  Medical Decision Making Adult male with multiple medical issues including recent valve repair, dyslipidemia, CKD, CAD, pacemaker presents with tremor, spasm.  Broad differential including ACS, electrolyte abnormalities, infection given recent procedure and all considered.  Patient placed on continuous monitoring, labs x-ray started. On monitor the patient has sinus rhythm, rate 76 with intraventricular conduction delay abnormal Pulse ox 100% room air normal   Amount and/or Complexity of Data Reviewed Independent Historian: EMS External Data Reviewed: notes.    Details: Recent valve repair Labs: ordered. Decision-making details documented in ED Course. Radiology: ordered and independent interpretation performed. Decision-making details documented in ED Course. ECG/medicine tests: ordered and independent interpretation performed. Decision-making details documented in ED Course.  Risk Prescription drug management. Decision regarding hospitalization.   8:56 PM Awake  and alert, wife, no ongoing complaints.  He confirms that he has had ongoing episodes of similar symptoms for years, but today's was more profound than usual.  Findings generally reassuring, x-ray with mild bilateral effusion, no congestion, no cardiomegaly, no lab evidence suggesting pneumonia, or complications from recent valve repair.  No evidence for bacteremia, sepsis.  Given his history of valve repair, age, hospitalization is a consideration but given his improvement here, duration of symptoms over the past few years, return to baseline, he is comfortable with discharge, this is appropriate and will follow-up closely as an outpatient.        Final Clinical Impression(s) / ED Diagnoses Final diagnoses:  Tremor    Rx / DC Orders ED Discharge Orders     None         Carmin Muskrat, MD 02/13/22 2210

## 2022-02-13 NOTE — Discharge Instructions (Signed)
As discussed, your evaluation today has been largely reassuring.  But, it is important that you monitor your condition carefully, and do not hesitate to return to the ED if you develop new, or concerning changes in your condition. ? ?Otherwise, please follow-up with your physician for appropriate ongoing care. ? ?

## 2022-02-13 NOTE — ED Notes (Signed)
Pt transported to XR.  

## 2022-02-14 ENCOUNTER — Encounter: Payer: Medicare Other | Attending: Cardiology | Admitting: Cardiology

## 2022-02-14 VITALS — BP 116/52 | HR 84 | Ht 70.0 in | Wt 154.0 lb

## 2022-02-14 DIAGNOSIS — I350 Nonrheumatic aortic (valve) stenosis: Secondary | ICD-10-CM | POA: Diagnosis not present

## 2022-02-14 DIAGNOSIS — I442 Atrioventricular block, complete: Secondary | ICD-10-CM | POA: Diagnosis not present

## 2022-02-14 DIAGNOSIS — I5032 Chronic diastolic (congestive) heart failure: Secondary | ICD-10-CM

## 2022-02-14 DIAGNOSIS — I2510 Atherosclerotic heart disease of native coronary artery without angina pectoris: Secondary | ICD-10-CM

## 2022-02-14 DIAGNOSIS — Z952 Presence of prosthetic heart valve: Secondary | ICD-10-CM

## 2022-02-14 DIAGNOSIS — N1832 Chronic kidney disease, stage 3b: Secondary | ICD-10-CM

## 2022-02-14 MED ORDER — AMOXICILLIN 500 MG PO TABS: ORAL_TABLET | ORAL | 6 refills | Status: DC

## 2022-02-14 NOTE — Progress Notes (Unsigned)
[facility] [place]                                     Cardiology Office Note:    Date:  02/15/2022   ID:  [name], DOB [birth year], MRN [id number]  PCP:  Mike Rail, MD  [name] Cardiologist:  Mike Mocha, MD  [name] Electrophysiologist:  None   Referring MD: Mike Rail, MD   Chief Complaint  Patient presents with   Follow-up    [name] s/p TAVR   History of Present Illness:    [name] is a 89 y.o. male with a hx of CABG in 2013 ([facility]) with early graft failure of all of his venous conduits and continued patency of the LIMA to LAD, CKD stage IIIb, chronic diastolic CHF, symptomatic bradycardia s/p PPM, anemia, prostate cancer, HTN, HLD, and severe AS who underwent TAVR on 02/06/22 and is being seen today for TOC follow up.    [name] has a history of RCA stenting. He ultimately was treated with multivessel CABG in 2013 with early graft failure of all of his venous conduits and continued patency of the mammary artery to LAD graft. He had a nuclear stress test in 2019 demonstrating no significant ischemia. He recently developed DOE and fatigue. Echo 12/19/22 showed EF 55-60%, mod LVH, and severe AS mean grad 26 mmHg, peak grad 40.7 mmHg, AVA 0.81 cm2, DVI 0.27, SVI 37 as well as moderate MAC with moderate MR and moderate TR. Given age, advanced CKD, and well outlined coronary anatomy, it was decided to forgo pre op coronary angiography.    The patient was evaluated by the multidisciplinary valve team and felt to have severe, symptomatic aortic stenosis and to be a suitable candidate for TAVR, which was set up for 02/06/22.   He is now s/p TAVR with a 26 mm Edwards Sapien 3 Ultra Resilia THV via the TF approach. Post operative echo showed normal valve function with an AVA by VTI at 2.58cm2 and mean gradient at 7.1mHg with no PVL.   Unfortunately he called our team 02/13/22 with c/o uncontrollable  "body jerking" with leg tightening and a brief period of leg and feet numbness. He was asked to proceed to the ED for further evaluation. It appears labwork and CXR stable with no concerns for infection/sepsis. It was noted by ED provider that he has had issues with this for years, although not to this extreme. He was stable and not felt to need hospital admission and was discharged home.   Today he is here with his wife. He feels much better today after the ED visit. He no longer has muscle shaking. He reports this has been an ongoing issue for many years however symptoms worsened after his procedure. As above, there were no significant findings. He also reports some LE weakness but feels this is deconditioning from inactivity. He is interested in [facility] He denies chest pain, SOB, LE edema, orthopnea, dizziness, or syncope.   Past Medical History:  Diagnosis Date   Anemia    AV block, 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 S/P stenting to mid RCA, prox PDA 06/1999. b. NSTEMI/CABG x 3 in 10/2011 with LIMA to LAD, SVG to PDA, and SVG to OM1.  Cephalalgia 07/14/2015   CHB (complete heart block) 05/03/2012   Overview:  Status post complete heart block heart rate 28 bpm, alternating with 2 to one AV block and drug for bradycardia.  Status post pacemaker implantation.status post Medtronic pacemaker implantation the 03/28/2012   Chronic diastolic CHF (congestive heart failure) 02/05/2018   Chronic gouty arthritis    Chronic UTI    a. Followed by Dr. Risa [name] - colonized/asymptomatic - not on abx   CKD (chronic kidney disease)    stage 3, GFR 30-59 ml/min; stable with a creatinine around 1.9-2.0 followed by nephrology.   Constipation 03/01/2015   Symptoms and exam consistent with constipation. Abdominal exam is benign with no evidence of pain or obstruction. Discussed importance of increasing fiber and water intake  coupled with physical activity to assist with bowel movements. Continue over-the-counter medication management as needed. Follow-up if symptoms worsen or fail to improve.   Coronary atherosclerosis 11/27/2011   Overview:  Multivessel coronary artery disease recently diagnosed by catheterization 2013 History of stent placement with a heparin-coated stent 2001  Last Assessment & Plan:  Status post coronary bypass grafting.  No recurrent chest pain. Overview:  Overview:  S/P stenting to mid RCA, prox PDA 06/1999;  06/2007 Myoview: negative except for apical thinning, EF 68%, last Myoview August 10, 2008 with n   Degeneration of lumbar intervertebral disc    Degenerative disc disease, cervical, with radiculopathy 85/88/5027   Diastolic dysfunction 74/12/8784   Grade 1 DD on Echo 06/2017   Dyslipidemia    Dysphagia 02/06/2016   3/18 - DG Esophagus:  1. Mild esophageal dysmotility, likely presbyesophagus. 2. No other explanation for patient's symptoms. 3. Small hiatal hernia.  On swallow eval - evidence of cervical spine disease and that was likely contributing   Epistaxis 12/08/2020   Essential hypertension 07/20/2006   GERD (gastroesophageal reflux disease)    Gout 05/12/2018   Gouty arthritis of right great toe 09/03/2017   Left toe Injected January 31, 2018    Hyperpigmentation 11/04/2017   Internal hemorrhoids 08/15/2016   Left inguinal hernia 07/01/2019   Lumbar post-laminectomy syndrome 12/12/2017   Moderate aortic regurgitation 07/04/2017   Echo 06/2017:  EF 55-60%, mild LVH, grade 1 DD, mild AS, mod AR, mild MR, mild-mod TR   Neck injury    a. 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  This was switched to by mouth amiodarone at the time of the thoracic surgery appointment was discontinued.   Presence of aortocoronary bypass graft 10/24/2011   Overview:  Performed at [name] 2013 Last Assessment & Plan:  No complication post coronary bypass grafting.   Prostate cancer ([place])    a. 2001 s/p TURP.   Pulmonary nodule    a. felt to be noncancerous.  Status post followup CT scan 4 mm and stable.   Renal artery stenosis    a. 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 Burt Knack & Dr. 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   Trochanteric bursitis of hip, bilateral 01/10/2017   UGIB (upper gastrointestinal bleed) 02/01/2018   EGD  02/06/18 - mod, non-erosive gastritis   Venous insufficiency of leg 06/05/2010   Vitamin B12 deficiency 08/23/2009   Jan '14  July '14 B12 level  >1500    472   Weakness of both lower extremities 04/09/2020    Past Surgical History:  Procedure Laterality Date   ANTERIOR CHAMBER WASHOUT Left 10/04/2018   Procedure: Anterior Chamber Washout, Vitreous Tap;  Surgeon: Jalene Mullet, MD;  Location: [place];  Service: Ophthalmology;  Laterality: Left;   cardia catherization  07-07-99   CARDIAC SURGERY  10/18/12   open heart surgery   CATARACT EXTRACTION W/ INTRAOCULAR LENS  IMPLANT, BILATERAL  3/205, 06/2013   [facility]   COLONOSCOPY  04/12/07   CORONARY ARTERY BYPASS GRAFT  10/19/2011   Procedure: CORONARY ARTERY BYPASS GRAFTING (CABG);  Surgeon: Gaye Pollack, MD;  Location: [place];  Service: Open Heart Surgery;  Laterality: N/A;  times three using Left Internal Mammary Artery and Right  Greater Saphenouse Vein Graft harvested Endoscopically   edg  07-17-1994   FLEXIBLE SIGMOIDOSCOPY  11-03-1997   GAS INSERTION Left 10/04/2018   Procedure: Insertion Of Gas;  Surgeon: Jalene Mullet, MD;  Location: [place];  Service: Ophthalmology;  Laterality: Left;   GAS/FLUID EXCHANGE Left 10/04/2018   Procedure: Gas/Fluid Exchange;  Surgeon: Jalene Mullet, MD;  Location: [facility];  Service: Ophthalmology;  Laterality: Left;   INTRAOPERATIVE TRANSTHORACIC ECHOCARDIOGRAM N/A 02/06/2022   Procedure: INTRAOPERATIVE TRANSTHORACIC ECHOCARDIOGRAM;  Surgeon: Mike Mocha, MD;  Location: [place] [facility];  Service: Open Heart Surgery;  Laterality: N/A;   LEFT HEART CATHETERIZATION WITH CORONARY ANGIOGRAM N/A 10/16/2011   Procedure: LEFT HEART CATHETERIZATION WITH CORONARY ANGIOGRAM;  Surgeon: Peter M Martinique, MD;  Location: [name];  Service: Cardiovascular;  Laterality: N/A;   LEFT HEART CATHETERIZATION WITH CORONARY/GRAFT ANGIOGRAM N/A 03/11/2013   Procedure: LEFT HEART CATHETERIZATION WITH [name];  Surgeon: Blane Ohara, MD;  Location: [facility] C[facility];  Service: Cardiovascular;  Laterality: N/A;   lumbar spinal disk and neck fusion surgery     PACEMAKER INSERTION  03/28/12   PPM implanted for mobitz II AV block   PARS PLANA VITRECTOMY Left 10/04/2018   Procedure: PARS PLANA VITRECTOMY 25 GAUGE FOR ENDOPHTHALMITIS WITH INJECTION OF INTRAVITREAL ANTIBIOTIC;  Surgeon: Jalene Mullet, MD;  Location: [place];  Service: Ophthalmology;  Laterality: Left;   peripheral vascular catherization  11-24-03   PERMANENT PACEMAKER INSERTION N/A 03/28/2012   Procedure: PERMANENT PACEMAKER INSERTION;  Surgeon: Thompson Grayer, MD;  Location: [name] C[facility];  Service: Cardiovascular;  Laterality: N/A;   PROSTATECTOMY     renal circulation  10-01-03   s/p ptca     stents     X 2   stress cardiolite  05-04-05   spring 09-negative except for apical thinning, EF 68%   TRANSCATHETER AORTIC VALVE REPLACEMENT,  TRANSFEMORAL Right 02/06/2022   Procedure: Transcatheter Aortic Valve Replacement, Transfemoral;  Surgeon: Mike Mocha, MD;  Location: [place] [facility];  Service: Open Heart Surgery;  Laterality: Right;    Current Medications: Current Meds  Medication Sig   acetaminophen (TYLENOL) 500 MG tablet Take 1,000 mg by mouth 3 (three) times daily as needed for moderate pain.   allopurinol (ZYLOPRIM) 100 MG tablet TAKE 1  TABLET(100 MG) BY MOUTH DAILY (Patient taking differently: Take 100 mg by mouth daily. TAKE 1 TABLET(100 MG) BY MOUTH DAILY)   amLODipine (NORVASC) 10 MG tablet Take 1 tablet (10 mg total) by mouth daily.   amoxicillin (AMOXIL) 500 MG tablet TAKE 4 TABLETS (2000 MG) BY MOUTH 1 HOUR PRIOR TO DENTAL APPOINTMENTS AND PROCEDURES   Aromatic Inhalants (VICKS VAPOR INHALER IN) Place 1 puff into both nostrils as needed (for congestion).   Ascorbic Acid (VITAMIN C) 1000 MG tablet Take 1,000 mg by mouth daily.   aspirin EC 81 MG tablet Take 81 mg by mouth daily.   Calcium Citrate-Vitamin D (CITRACAL + D PO) Take 2 tablets by mouth in the morning and at bedtime.   Carboxymeth-Glycerin-Polysorb (REFRESH OPTIVE MEGA-3 OP) Place 1 drop into both eyes 2 (two) times daily.   Cholecalciferol 25 MCG (1000 UT) capsule Take 1,000 Units by mouth daily.   cyanocobalamin (,VITAMIN B-12,) 1000 MCG/ML injection Inject 1,000 mcg into the muscle once. Monthly injection   folic acid (FOLVITE) 1 MG tablet Take 1 tablet (1 mg total) by mouth daily. Annual appt due in May must see provider for future refills (Patient taking differently: Take 1 mg by mouth at bedtime. Annual appt due in May must see provider for future refills)   furosemide (LASIX) 40 MG tablet TAKE 1 TABLET BY MOUTH EVERY DAY (Patient taking differently: Take 40 mg by mouth as needed for fluid or edema.)   hydrALAZINE (APRESOLINE) 50 MG tablet Take 1 tablet (50 mg total) by mouth 3 (three) times daily.   loratadine (CLARITIN) 10 MG tablet Take 10  mg by mouth daily as needed (for seasonal allergies).   Multiple Vitamins-Minerals (PRESERVISION AREDS 2 PO) Take 1 tablet by mouth in the morning and at bedtime.   nitroGLYCERIN (NITROSTAT) 0.4 MG SL tablet DISSOLVE 1 TABLET UNDER THE TONGUE EVERY 5 MINUTES AS NEEDED FOR CHEST PAIN, MAXIMUM 3 TABLETS (Patient taking differently: Place 0.4 mg under the tongue every 5 (five) minutes as needed for chest pain.)   Saline (AYR NASAL MIST ALLERGY/SINUS NA) Place 2 sprays into the nose as needed (dryness).   Vitamin D, Ergocalciferol, (DRISDOL) 1.25 MG (50000 UNIT) CAPS capsule Take by mouth.   Current Facility-Administered Medications for the 02/14/22 encounter (Office Visit) with [facility]  Medication   NON FORMULARY 1 application     Allergies:   Aspirin, Lisinopril, Amoxicillin, Atarax [hydroxyzine hcl], Cephalexin, Ciprofloxacin, Clindamycin, Clobetasol, Codeine, Fish allergy, Fluarix [influenza virus vaccine], Haemophilus influenzae, Hydrocodone, Hydrocodone-acetaminophen, Hydroxyzine, Latex, Macrobid [nitrofurantoin macrocrystal], Niacin, Niacin-lovastatin er, Niacin-lovastatin er, Nitrofurantoin, Omeprazole, Other, Tramadol, Vibramycin [doxycycline], Adhesive [tape], Bactrim [sulfamethoxazole-trimethoprim], Colchicine, Gabapentin, and Nortriptyline   Social History   Socioeconomic History   Marital status: Married    Spouse name: [name]   Number of children: 2   Years of education: 13   Highest education level: Some college, no degree  Occupational History   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  married '52 - 3 years, divorced; married '56 - 3 years divorced; married '84-12 yrs - divorced; married '75 -. 1 son '57; 1 daughter - '53; 1 grandchild.  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  Very happily married.  End of life care: yes CPR, no long term mechanical ventilation, no heroic measures.right handed   Right handed   Social Determinants of Health   Financial Resource Strain: Low Risk  (07/28/2021)   Overall Financial Resource Strain (CARDIA)    Difficulty of Paying Living Expenses: Not hard at all  Food Insecurity: No Food Insecurity (02/09/2022)   Hunger Vital Sign    Worried About Running Out of Food in the Last Year: Never true    Ran Out of Food in the Last Year: Never true  Transportation Needs: No Transportation Needs (02/09/2022)   PRAPARE - Hydrologist (Medical): No    Lack of Transportation (Non-Medical): No  Physical Activity: Insufficiently Active (07/28/2021)   Exercise Vital Sign    Days of Exercise per Week: 2 days    Minutes of Exercise per Session: 20 min  Stress: No Stress Concern Present (07/28/2021)   [place]    Feeling of Stress : Not at all  Social Connections: [place] (07/28/2021)   Social Connection and Isolation Panel [NHANES]    Frequency of Communication with Friends and Family: Three times a week    Frequency of Social Gatherings with Friends and Family: Three times a week    Attends Religious Services: More than 4 times per year    Active Member of Clubs or Organizations: Yes    Attends Archivist Meetings: 1 to 4 times per year    Marital Status: Married     Family History: The patient's family history includes Arthritis in his mother; Breast cancer in an other family member; Cancer in his brother;  Coronary artery disease in his father; Heart disease in his brother; Nephritis in his brother; Other in his brother and mother; Prostate cancer in his brother. There is no history of Diabetes, Colon cancer, or Adrenal disorder.  ROS:   Please see the history of present illness.    All other systems reviewed and are negative.  EKGs/Labs/Other Studies Reviewed:    The following studies were reviewed today:  TAVR OPERATIVE NOTE     Date of Procedure:                02/06/2022   Preoperative Diagnosis:      Severe Aortic Stenosis    Postoperative Diagnosis:    Same    Procedure:        Transcatheter Aortic Valve Replacement - Percutaneous  Transfemoral Approach             Edwards Sapien 3 Ultra Resilia THV (size 26 mm, serial # [id number])              Co-Surgeons:                        Coralie Common, MD and Mike Mocha, MD   Anesthesiologist:                  Jana Half, DO   Echocardiographer:              Marry Guan, MD   Pre-operative  Echo Findings: Severe aortic stenosis Normal left ventricular systolic function   Post-operative Echo Findings: No paravalvular leak Normal/unchanged left ventricular systolic function   _____________    Echo 02/07/22:   1. The aortic valve has been repaired/replaced. Aortic valve  regurgitation is not visualized. There is a 26 mm Sapien prosthetic (TAVR)  valve present in the aortic position. Procedure Date: 02/06/2022. Echo  findings are consistent with normal structure  and function of the aortic valve prosthesis. Aortic valve area, by VTI  measures 2.58 cm. Aortic valve mean gradient measures 7.3 mmHg. Aortic  valve Vmax measures 1.84 m/s.   2. Left ventricular ejection fraction, by estimation, is 55 to 60%. The  left ventricle has normal function. The left ventricle has no regional  wall motion abnormalities. There is mild concentric left ventricular  hypertrophy. Left ventricular diastolic  parameters are consistent with  Grade II diastolic dysfunction  (pseudonormalization).   3. Right ventricular systolic function is normal. The right ventricular  size is normal. There is mildly elevated pulmonary artery systolic  pressure. The estimated right ventricular systolic pressure is 15.0 mmHg.   4. Left atrial size was severely dilated.   5. The mitral valve is grossly normal. Mild to moderate mitral valve  regurgitation. No evidence of mitral stenosis.   6. The inferior vena cava is dilated in size with >50% respiratory  variability, suggesting right atrial pressure of 8 mmHg.   EKG:  EKG is ordered today.  The ekg ordered today demonstrates NSR with atrial ectopy. Stable rate at 67bpm.   Recent Labs: 02/13/2022: ALT 11; BUN 38; Creatinine, Ser 1.98; Hemoglobin 9.9; Magnesium 2.3; Platelets 229; Potassium 4.1; Sodium 137  Recent Lipid Panel    Component Value Date/Time   CHOL 122 09/04/2016 1123   TRIG 59.0 09/04/2016 1123   TRIG 107 01/16/2006 1338   HDL 40.40 09/04/2016 1123   CHOLHDL 3 09/04/2016 1123   VLDL 11.8 09/04/2016 1123   LDLCALC 70 09/04/2016 1123    Physical Exam:    VS:  BP (!) 116/52   Pulse 84   Ht '5\' 10"'$  (1.778 m)   Wt 154 lb (69.9 kg)   SpO2 95%   BMI 22.10 kg/m     Wt Readings from Last 3 Encounters:  02/14/22 154 lb (69.9 kg)  02/13/22 164 lb (74.4 kg)  02/08/22 151 lb 11.2 oz (68.8 kg)    General: Well developed, well nourished, NAD Lungs:Clear to ausculation bilaterally. No wheezes, rales, or rhonchi. Breathing is unlabored. Cardiovascular: RRR with S1 S2. No murmurs Extremities: No edema.  Neuro: Alert and oriented. No focal deficits. No facial asymmetry. MAE spontaneously. Psych: Responds to questions appropriately with normal affect.    ASSESSMENT/PLAN:    Severe AS: Patient doing well with NYHA class II symptoms s/p successful TAVR with a 26 mm Edwards Sapien 3 Ultra Resilia THV via the TF approach on 02/06/22. Post operative echo completed with stable valve  function with a mean gradient at 7.6mHg and AVA at 1.84cm2. Groin sites are stable. Continue  Asprin '81mg'$  daily. He will require lifelong dental SBE. Amoxicillin sent to preferred pharm. Plan one month follow up with echo.     HTN: Stable today with no changes needed at this time.    S/p PPM: followed by Dr. MMyles Gip(previously Allred). There was some concern for atrial fibrillation during admission, although this was reviewed and not felt to be true AF. Denies palpitations. Will monitor device downloads and if he develops a  significant afib burden we can discuss [place].    CKD stage IIIb: Creat at 1.98 with a baseline at 1.7-2.0.    Chronic diastolic CHF: Appears euvolemic. No changes made. Continue PRN home lasix.    CAD s/p CABG: s/p CABG in 2013 ([facility]) with early graft failure of all of his venous conduits and continued patency of the LIMA to LAD. Continue medical therapy.    Renal lesion: of note, pre TAVR CTs showed a small hypodense left renal cortical lesions, stable size from recent 10/28/2021 CT abdomen. Further characterization with MRI or CT abdomen without and with IV contrast may be considered as clinically warranted. Pt had PET 12/14 and nothing noted on left side of pelvis. I don't think further work up of this is indicated at this time.  { Medication Adjustments/Labs and Tests Ordered: Current medicines are reviewed at length with the patient today.  Concerns regarding medicines are outlined above.  No orders of the defined types were placed in this encounter.  Meds ordered this encounter  Medications   amoxicillin (AMOXIL) 500 MG tablet    Sig: TAKE 4 TABLETS (2000 MG) BY MOUTH 1 HOUR PRIOR TO DENTAL APPOINTMENTS AND PROCEDURES    Dispense:  12 tablet    Refill:  6    Patient Instructions  Medication Instructions:  Your physician has recommended you make the following change in your medication:  START AMOXICILLIN 500 MG - TAKE 4 TABLETS (2000 MG) 1 HOUR PRIOR TO DENTAL  APPOINTMENTS AND PROCEDURES   *If you need a refill on your cardiac medications before your next appointment, please call your pharmacy*   Lab Work: NONE If you have labs (blood work) drawn today and your tests are completely normal, you will receive your results only by: [place] (if you have MyChart) OR A paper copy in the mail If you have any lab test that is abnormal or we need to change your treatment, we will call you to review the results.   Testing/Procedures: NONE   Follow-Up: At [name], you and your health needs are our priority.  As part of our continuing mission to provide you with exceptional heart care, we have created designated Provider Care Teams.  These Care Teams include your primary Cardiologist (physician) and Advanced Practice Providers (APPs -  Physician Assistants and Nurse Practitioners) who all work together to provide you with the care you need, when you need it.  We recommend signing up for the patient portal called "MyChart".  Sign up information is provided on this After Visit Summary.  MyChart is used to connect with patients for Virtual Visits (Telemedicine).  Patients are able to view lab/test results, encounter notes, upcoming appointments, etc.  Non-urgent messages can be sent to your provider as well.   To learn more about what you can do with MyChart, go to [email or web address].    Your next appointment:   KEEP SCHEDULED FOLLOW-UP   Signed, Kathyrn Drown, NP  02/15/2022 2:56 PM    [facility]

## 2022-02-14 NOTE — Patient Instructions (Addendum)
Medication Instructions:  Your physician has recommended you make the following change in your medication:  START AMOXICILLIN 500 MG - TAKE 4 TABLETS (2000 MG) 1 HOUR PRIOR TO DENTAL APPOINTMENTS AND PROCEDURES   *If you need a refill on your cardiac medications before your next appointment, please call your pharmacy*   Lab Work: NONE If you have labs (blood work) drawn today and your tests are completely normal, you will receive your results only by: [place] (if you have MyChart) OR A paper copy in the mail If you have any lab test that is abnormal or we need to change your treatment, we will call you to review the results.   Testing/Procedures: NONE   Follow-Up: At [facility], you and your health needs are our priority.  As part of our continuing mission to provide you with exceptional heart care, we have created designated Provider Care Teams.  These Care Teams include your primary Cardiologist (physician) and Advanced Practice Providers (APPs -  Physician Assistants and Nurse Practitioners) who all work together to provide you with the care you need, when you need it.  We recommend signing up for the patient portal called "MyChart".  Sign up information is provided on this After Visit Summary.  MyChart is used to connect with patients for Virtual Visits (Telemedicine).  Patients are able to view lab/test results, encounter notes, upcoming appointments, etc.  Non-urgent messages can be sent to your provider as well.   To learn more about what you can do with MyChart, go to [email or web address].    Your next appointment:   KEEP SCHEDULED FOLLOW-UP

## 2022-02-16 ENCOUNTER — Ambulatory Visit: Payer: Medicare Other

## 2022-02-16 ENCOUNTER — Other Ambulatory Visit: Payer: Self-pay

## 2022-02-16 DIAGNOSIS — Z952 Presence of prosthetic heart valve: Secondary | ICD-10-CM

## 2022-02-16 DIAGNOSIS — I350 Nonrheumatic aortic (valve) stenosis: Secondary | ICD-10-CM

## 2022-02-20 ENCOUNTER — Telehealth (HOSPITAL_COMMUNITY): Payer: Self-pay

## 2022-02-20 NOTE — Telephone Encounter (Signed)
Pt insurance is active and benefits verified through Medicare A/B. Co-pay $0.00, DED $240.00/$240.00 met, out of pocket $0.00/$0.00 met, co-insurance 20%. No pre-authorization required. Passport, 02/20/22 @ 11:10AM, REF#[id number]   How many CR sessions are covered? (36 sessions for TCR, 72 sessions for ICR)72 Is this a lifetime maximum or an annual maximum? Lifetime Has the member used any of these services to date? Yes/35 Is there a time limit (weeks/months) on start of program and/or program completion? No     Will contact patient to see if he is interested in the Cardiac Rehab Program. If interested, patient will need to complete follow up appt. Once completed, patient will be contacted for scheduling upon review by the RN Navigator.

## 2022-02-20 NOTE — Telephone Encounter (Signed)
Called patient to see if he is interested in the Cardiac Rehab Program. Patient expressed interest. Explained scheduling process and went over insurance, patient verbalized understanding. Will contact patient for scheduling once f/u has been completed.  °

## 2022-02-21 ENCOUNTER — Emergency Department (HOSPITAL_COMMUNITY): Payer: Medicare Other

## 2022-02-21 ENCOUNTER — Observation Stay (HOSPITAL_COMMUNITY)
Admission: EM | Admit: 2022-02-21 | Discharge: 2022-02-22 | Disposition: A | Discharge status: Home Health | Payer: Medicare Other | Attending: Family Medicine | Admitting: Family Medicine

## 2022-02-21 ENCOUNTER — Other Ambulatory Visit: Payer: Self-pay

## 2022-02-21 DIAGNOSIS — I5032 Chronic diastolic (congestive) heart failure: Secondary | ICD-10-CM | POA: Diagnosis not present

## 2022-02-21 DIAGNOSIS — D638 Anemia in other chronic diseases classified elsewhere: Secondary | ICD-10-CM | POA: Diagnosis present

## 2022-02-21 DIAGNOSIS — Z952 Presence of prosthetic heart valve: Secondary | ICD-10-CM | POA: Diagnosis not present

## 2022-02-21 DIAGNOSIS — I2510 Atherosclerotic heart disease of native coronary artery without angina pectoris: Secondary | ICD-10-CM | POA: Diagnosis present

## 2022-02-21 DIAGNOSIS — Z79899 Other long term (current) drug therapy: Secondary | ICD-10-CM | POA: Insufficient documentation

## 2022-02-21 DIAGNOSIS — E785 Hyperlipidemia, unspecified: Secondary | ICD-10-CM | POA: Diagnosis present

## 2022-02-21 DIAGNOSIS — Z91040 Latex allergy status: Secondary | ICD-10-CM | POA: Diagnosis not present

## 2022-02-21 DIAGNOSIS — R059 Cough, unspecified: Secondary | ICD-10-CM | POA: Diagnosis not present

## 2022-02-21 DIAGNOSIS — R509 Fever, unspecified: Secondary | ICD-10-CM | POA: Diagnosis not present

## 2022-02-21 DIAGNOSIS — D696 Thrombocytopenia, unspecified: Secondary | ICD-10-CM | POA: Diagnosis not present

## 2022-02-21 DIAGNOSIS — I4891 Unspecified atrial fibrillation: Secondary | ICD-10-CM | POA: Diagnosis not present

## 2022-02-21 DIAGNOSIS — Z1152 Encounter for screening for COVID-19: Secondary | ICD-10-CM | POA: Diagnosis not present

## 2022-02-21 DIAGNOSIS — Z950 Presence of cardiac pacemaker: Secondary | ICD-10-CM | POA: Diagnosis not present

## 2022-02-21 DIAGNOSIS — N179 Acute kidney failure, unspecified: Secondary | ICD-10-CM | POA: Diagnosis not present

## 2022-02-21 DIAGNOSIS — Z7982 Long term (current) use of aspirin: Secondary | ICD-10-CM | POA: Diagnosis not present

## 2022-02-21 DIAGNOSIS — Z951 Presence of aortocoronary bypass graft: Secondary | ICD-10-CM | POA: Diagnosis not present

## 2022-02-21 DIAGNOSIS — N189 Chronic kidney disease, unspecified: Secondary | ICD-10-CM | POA: Diagnosis present

## 2022-02-21 DIAGNOSIS — R5383 Other fatigue: Secondary | ICD-10-CM | POA: Diagnosis present

## 2022-02-21 DIAGNOSIS — I130 Hypertensive heart and chronic kidney disease with heart failure and stage 1 through stage 4 chronic kidney disease, or unspecified chronic kidney disease: Secondary | ICD-10-CM | POA: Insufficient documentation

## 2022-02-21 DIAGNOSIS — I10 Essential (primary) hypertension: Secondary | ICD-10-CM | POA: Diagnosis present

## 2022-02-21 DIAGNOSIS — I5030 Unspecified diastolic (congestive) heart failure: Secondary | ICD-10-CM | POA: Diagnosis present

## 2022-02-21 DIAGNOSIS — N1832 Chronic kidney disease, stage 3b: Secondary | ICD-10-CM | POA: Insufficient documentation

## 2022-02-21 DIAGNOSIS — Z8546 Personal history of malignant neoplasm of prostate: Secondary | ICD-10-CM | POA: Insufficient documentation

## 2022-02-21 LAB — RESP PANEL BY RT-PCR (RSV, FLU A&B, COVID)  RVPGX2
Influenza A by PCR: NEGATIVE
Influenza B by PCR: NEGATIVE
Resp Syncytial Virus by PCR: NEGATIVE
SARS Coronavirus 2 by RT PCR: NEGATIVE

## 2022-02-21 LAB — RESPIRATORY PANEL BY PCR

## 2022-02-21 LAB — CBC WITH DIFFERENTIAL/PLATELET
Abs Immature Granulocytes: 0.02 10*3/uL (ref 0.00–0.07)
Basophils Absolute: 0 10*3/uL (ref 0.0–0.1)
Basophils Relative: 1 %
Eosinophils Absolute: 0 10*3/uL (ref 0.0–0.5)
Eosinophils Relative: 0 %
HCT: 29.4 % — ABNORMAL LOW (ref 39.0–52.0)
Hemoglobin: 9.4 g/dL — ABNORMAL LOW (ref 13.0–17.0)
Immature Granulocytes: 1 %
Lymphocytes Relative: 3 %
Lymphs Abs: 0.1 10*3/uL — ABNORMAL LOW (ref 0.7–4.0)
MCH: 25.6 pg — ABNORMAL LOW (ref 26.0–34.0)
MCHC: 32 g/dL (ref 30.0–36.0)
MCV: 80.1 fL (ref 80.0–100.0)
Monocytes Absolute: 0.2 10*3/uL (ref 0.1–1.0)
Monocytes Relative: 5 %
Neutro Abs: 4 10*3/uL (ref 1.7–7.7)
Neutrophils Relative %: 90 %
Platelets: 119 10*3/uL — ABNORMAL LOW (ref 150–400)
RBC: 3.67 MIL/uL — ABNORMAL LOW (ref 4.22–5.81)
RDW: 15.8 % — ABNORMAL HIGH (ref 11.5–15.5)
WBC: 4.4 10*3/uL (ref 4.0–10.5)
nRBC: 0 % (ref 0.0–0.2)

## 2022-02-21 LAB — URINALYSIS, ROUTINE W REFLEX MICROSCOPIC
Bilirubin Urine: NEGATIVE
Glucose, UA: NEGATIVE mg/dL
Hgb urine dipstick: NEGATIVE
Ketones, ur: 5 mg/dL — AB
Leukocytes,Ua: NEGATIVE
Nitrite: NEGATIVE
Protein, ur: 100 mg/dL — AB
Specific Gravity, Urine: 1.016 (ref 1.005–1.030)
pH: 5 (ref 5.0–8.0)

## 2022-02-21 LAB — PROTIME-INR
INR: 1.2 (ref 0.8–1.2)
Prothrombin Time: 15.2 seconds (ref 11.4–15.2)

## 2022-02-21 LAB — COMPREHENSIVE METABOLIC PANEL
ALT: 21 U/L (ref 0–44)
AST: 43 U/L — ABNORMAL HIGH (ref 15–41)
Albumin: 3.8 g/dL (ref 3.5–5.0)
Alkaline Phosphatase: 58 U/L (ref 38–126)
Anion gap: 11 (ref 5–15)
BUN: 39 mg/dL — ABNORMAL HIGH (ref 8–23)
CO2: 19 mmol/L — ABNORMAL LOW (ref 22–32)
Calcium: 9.4 mg/dL (ref 8.9–10.3)
Chloride: 106 mmol/L (ref 98–111)
Creatinine, Ser: 2.57 mg/dL — ABNORMAL HIGH (ref 0.61–1.24)
GFR, Estimated: 23 mL/min — ABNORMAL LOW (ref >60)
Glucose, Bld: 137 mg/dL — ABNORMAL HIGH (ref 70–99)
Potassium: 3.8 mmol/L (ref 3.5–5.1)
Sodium: 136 mmol/L (ref 135–145)
Total Bilirubin: 0.7 mg/dL (ref 0.3–1.2)
Total Protein: 6.8 g/dL (ref 6.5–8.1)

## 2022-02-21 LAB — LACTIC ACID, PLASMA
Lactic Acid, Venous: 1.4 mmol/L (ref 0.5–1.9)
Lactic Acid, Venous: 1 mmol/L (ref 0.5–1.9)

## 2022-02-21 LAB — APTT: aPTT: 36 seconds (ref 24–36)

## 2022-02-21 MED ORDER — AMLODIPINE BESYLATE 10 MG PO TABS
10.0000 mg | ORAL_TABLET | Freq: Every day | ORAL | Status: DC
  Administered 2022-02-22: 10 mg via ORAL
  Filled 2022-02-21: qty 1

## 2022-02-21 MED ORDER — SODIUM CHLORIDE 0.9 % IV SOLN
2.0000 g | INTRAVENOUS | Status: DC
  Administered 2022-02-21: 2 g via INTRAVENOUS
  Filled 2022-02-21 (×2): qty 12.5

## 2022-02-21 MED ORDER — ONDANSETRON HCL 4 MG PO TABS: 4.0000 mg | ORAL_TABLET | Freq: Four times a day (QID) | ORAL | Status: DC | PRN

## 2022-02-21 MED ORDER — ASPIRIN 81 MG PO TBEC
81.0000 mg | DELAYED_RELEASE_TABLET | Freq: Every day | ORAL | Status: DC
  Administered 2022-02-22: 81 mg via ORAL
  Filled 2022-02-21: qty 1

## 2022-02-21 MED ORDER — VANCOMYCIN HCL 1500 MG/300ML IV SOLN: 1500.0000 mg | INTRAVENOUS | Status: DC

## 2022-02-21 MED ORDER — ACETAMINOPHEN 650 MG RE SUPP: 650.0000 mg | Freq: Four times a day (QID) | RECTAL | Status: DC | PRN

## 2022-02-21 MED ORDER — ALLOPURINOL 100 MG PO TABS
100.0000 mg | ORAL_TABLET | Freq: Every day | ORAL | Status: DC
  Administered 2022-02-22: 100 mg via ORAL
  Filled 2022-02-21: qty 1

## 2022-02-21 MED ORDER — ACETAMINOPHEN 325 MG PO TABS
650.0000 mg | ORAL_TABLET | Freq: Four times a day (QID) | ORAL | Status: DC | PRN
  Administered 2022-02-22 (×2): 650 mg via ORAL
  Filled 2022-02-21 (×2): qty 2

## 2022-02-21 MED ORDER — SENNOSIDES-DOCUSATE SODIUM 8.6-50 MG PO TABS: 1.0000 | ORAL_TABLET | Freq: Every evening | ORAL | Status: DC | PRN

## 2022-02-21 MED ORDER — LACTATED RINGERS IV SOLN: INTRAVENOUS | Status: DC

## 2022-02-21 MED ORDER — HYDRALAZINE HCL 50 MG PO TABS
50.0000 mg | ORAL_TABLET | Freq: Three times a day (TID) | ORAL | Status: DC
  Administered 2022-02-22 (×2): 50 mg via ORAL
  Filled 2022-02-21 (×2): qty 1

## 2022-02-21 MED ORDER — ACETAMINOPHEN 325 MG PO TABS
325.0000 mg | ORAL_TABLET | Freq: Once | ORAL | Status: AC
  Administered 2022-02-21: 325 mg via ORAL
  Filled 2022-02-21: qty 1

## 2022-02-21 MED ORDER — SODIUM CHLORIDE 0.9 % IV BOLUS (SEPSIS)
1000.0000 mL | Freq: Once | INTRAVENOUS | Status: AC
  Administered 2022-02-21: 1000 mL via INTRAVENOUS

## 2022-02-21 MED ORDER — VANCOMYCIN HCL 1500 MG/300ML IV SOLN
1500.0000 mg | Freq: Once | INTRAVENOUS | Status: AC
  Administered 2022-02-21: 1500 mg via INTRAVENOUS
  Filled 2022-02-21: qty 300

## 2022-02-21 MED ORDER — SODIUM CHLORIDE 0.9 % IV SOLN: INTRAVENOUS | Status: AC

## 2022-02-21 MED ORDER — HEPARIN SODIUM (PORCINE) 5000 UNIT/ML IJ SOLN
5000.0000 [IU] | Freq: Three times a day (TID) | INTRAMUSCULAR | Status: DC
  Administered 2022-02-21 – 2022-02-22 (×3): 5000 [IU] via SUBCUTANEOUS
  Filled 2022-02-21 (×3): qty 1

## 2022-02-21 MED ORDER — ONDANSETRON HCL 4 MG/2ML IJ SOLN: 4.0000 mg | Freq: Four times a day (QID) | INTRAMUSCULAR | Status: DC | PRN

## 2022-02-21 NOTE — Assessment & Plan Note (Signed)
Denies chest pain.  Continue aspirin.  Not on statin presumably due to history of myalgias.

## 2022-02-21 NOTE — Progress Notes (Signed)
Elink following sepsis bundle. °

## 2022-02-21 NOTE — Assessment & Plan Note (Signed)
- 

## 2022-02-21 NOTE — Assessment & Plan Note (Signed)
Appears hypovolemic on admission.  Holding diuretics and placed on gentle IV fluid hydration overnight.

## 2022-02-21 NOTE — ED Provider Notes (Signed)
[facility] Provider Note   CSN: [id number] Arrival date & time: 02/21/22  1652     History  Chief Complaint  Patient presents with   Fatigue    [name] is a 89 y.o. male.  Patient here with fever and bodyaches for the last 3 to 4 days.  May be started Sunday night or Monday.  He had TAVR procedure done 2 weeks ago.  He has had some cough and congestion and flulike symptoms.  Denies any pain with urination or abdominal pain or nausea vomiting or diarrhea.  Nothing makes it worse or better.  History of aortic stenosis status post TAVR also with history of gout.  The history is provided by the patient.       Home Medications Prior to Admission medications   Medication Sig Start Date End Date Taking? Authorizing Provider  acetaminophen (TYLENOL) 500 MG tablet Take 1,000 mg by mouth 3 (three) times daily as needed for moderate pain.    [provider]  allopurinol (ZYLOPRIM) 100 MG tablet TAKE 1 TABLET(100 MG) BY MOUTH DAILY Patient taking differently: Take 100 mg by mouth daily. TAKE 1 TABLET(100 MG) BY MOUTH DAILY 12/04/21   [name], MD  amLODipine (NORVASC) 10 MG tablet Take 1 tablet (10 mg total) by mouth daily. 02/07/22   Eileen Stanford, PA-C  amoxicillin (AMOXIL) 500 MG tablet TAKE 4 TABLETS (2000 MG) BY MOUTH 1 HOUR PRIOR TO DENTAL APPOINTMENTS AND PROCEDURES 02/14/22   Tommie Raymond, NP  Aromatic Inhalants (VICKS VAPOR INHALER IN) Place 1 puff into both nostrils as needed (for congestion).    [provider]  Ascorbic Acid (VITAMIN C) 1000 MG tablet Take 1,000 mg by mouth daily.    [provider]  aspirin EC 81 MG tablet Take 81 mg by mouth daily.    [provider]  Calcium Citrate-Vitamin D (CITRACAL + D PO) Take 2 tablets by mouth in the morning and at bedtime.    [provider]  Carboxymeth-Glycerin-Polysorb (REFRESH OPTIVE MEGA-3 OP) Place 1 drop into both  eyes 2 (two) times daily.    [provider]  Cholecalciferol 25 MCG (1000 UT) capsule Take 1,000 Units by mouth daily.    [provider]  cyanocobalamin (,VITAMIN B-12,) 1000 MCG/ML injection Inject 1,000 mcg into the muscle once. Monthly injection    [provider]  folic acid (FOLVITE) 1 MG tablet Take 1 tablet (1 mg total) by mouth daily. Annual appt due in May must see provider for future refills Patient taking differently: Take 1 mg by mouth at bedtime. Annual appt due in May must see provider for future refills 12/04/21   [name], MD  furosemide (LASIX) 40 MG tablet TAKE 1 TABLET BY MOUTH EVERY DAY Patient taking differently: Take 40 mg by mouth as needed for fluid or edema. 04/05/21   [name], MD  hydrALAZINE (APRESOLINE) 50 MG tablet Take 1 tablet (50 mg total) by mouth 3 (three) times daily. 02/08/22   Eileen Stanford, PA-C  loratadine (CLARITIN) 10 MG tablet Take 10 mg by mouth daily as needed (for seasonal allergies).    [provider]  Multiple Vitamins-Minerals (PRESERVISION AREDS 2 PO) Take 1 tablet by mouth in the morning and at bedtime.    [provider]  nitroGLYCERIN (NITROSTAT) 0.4 MG SL tablet DISSOLVE 1 TABLET UNDER THE TONGUE EVERY 5 MINUTES AS NEEDED FOR CHEST PAIN, MAXIMUM 3  TABLETS Patient taking differently: Place 0.4 mg under the tongue every 5 (five) minutes as needed for chest pain. 04/30/19   [name], MD  Saline (AYR NASAL MIST ALLERGY/SINUS NA) Place 2 sprays into the nose as needed (dryness).    [provider]  Vitamin D, Ergocalciferol, (DRISDOL) 1.25 MG (50000 UNIT) CAPS capsule Take by mouth. 07/13/21   [provider]      Allergies    Aspirin, Lisinopril, Amoxicillin, Atarax [hydroxyzine hcl], Cephalexin, Ciprofloxacin, Clindamycin, Clobetasol, Codeine, Fish allergy, Fluarix [influenza virus vaccine], Haemophilus influenzae, Hydrocodone, Hydrocodone-acetaminophen, Hydroxyzine,  Latex, Macrobid [nitrofurantoin macrocrystal], Niacin, Niacin-lovastatin er, Niacin-lovastatin er, Nitrofurantoin, Omeprazole, Other, Tramadol, Vibramycin [doxycycline], Adhesive [tape], Bactrim [sulfamethoxazole-trimethoprim], Colchicine, Gabapentin, and Nortriptyline    Review of Systems   Review of Systems  Physical Exam Updated Vital Signs BP (!) 126/111   Pulse 80   Temp 99 F (37.2 C) (Oral)   Resp 13   Ht 5' 10"$  (1.778 m)   Wt 78.9 kg   SpO2 96%   BMI 24.97 kg/m  Physical Exam Vitals and nursing note reviewed.  Constitutional:      General: He is not in acute distress.    Appearance: He is well-developed. He is not ill-appearing.  HENT:     Head: Normocephalic and atraumatic.     Nose: Nose normal.     Mouth/Throat:     Mouth: Mucous membranes are moist.  Eyes:     Extraocular Movements: Extraocular movements intact.     Conjunctiva/sclera: Conjunctivae normal.     Pupils: Pupils are equal, round, and reactive to light.  Cardiovascular:     Rate and Rhythm: Normal rate and regular rhythm.     Pulses: Normal pulses.     Heart sounds: Normal heart sounds. No murmur heard. Pulmonary:     Effort: Pulmonary effort is normal. No respiratory distress.     Breath sounds: Normal breath sounds.  Abdominal:     Palpations: Abdomen is soft.     Tenderness: There is no abdominal tenderness.  Musculoskeletal:        General: No swelling.     Cervical back: Normal range of motion and neck supple.  Skin:    General: Skin is warm and dry.     Capillary Refill: Capillary refill takes less than 2 seconds.  Neurological:     General: No focal deficit present.     Mental Status: He is alert.  Psychiatric:        Mood and Affect: Mood normal.     ED Results / Procedures / Treatments   Labs (all labs ordered are listed, but only abnormal results are displayed) Labs Reviewed  COMPREHENSIVE METABOLIC PANEL - Abnormal; Notable for the following components:      Result Value    CO2 19 (*)    Glucose, Bld 137 (*)    BUN 39 (*)    Creatinine, Ser 2.57 (*)    AST 43 (*)    GFR, Estimated 23 (*)    All other components within normal limits  CBC WITH DIFFERENTIAL/PLATELET - Abnormal; Notable for the following components:   RBC 3.67 (*)    Hemoglobin 9.4 (*)    HCT 29.4 (*)    MCH 25.6 (*)    RDW 15.8 (*)    Platelets 119 (*)    Lymphs Abs 0.1 (*)    All other components within normal limits  RESP PANEL BY RT-PCR (RSV, FLU A&B, COVID)  RVPGX2  CULTURE,  BLOOD (ROUTINE X 2)  CULTURE, BLOOD (ROUTINE X 2)  URINE CULTURE  LACTIC ACID, PLASMA  PROTIME-INR  APTT  LACTIC ACID, PLASMA  URINALYSIS, ROUTINE W REFLEX MICROSCOPIC    EKG EKG Interpretation  Date/Time:  Wednesday February 21 2022 17:06:46 EST Ventricular Rate:  72 PR Interval:  211 QRS Duration: 159 QT Interval:  426 QTC Calculation: 467 R Axis:   -74 Text Interpretation: Sinus or ectopic atrial rhythm RBBB and LAFB Probable left ventricular hypertrophy Confirmed by Lennice Sites ([id number]) on 02/21/2022 5:16:11 PM  Radiology DG Chest Port 1 View  Result Date: 02/21/2022 CLINICAL DATA:  Possible sepsis lethargy fever EXAM: PORTABLE CHEST 1 VIEW COMPARISON:  02/13/2022 FINDINGS: Left-sided dual lead pacing device as before. Valve prosthesis. Status post sternotomy. No focal airspace disease. Probable small pleural effusions. Stable cardiomediastinal silhouette with aortic atherosclerosis. IMPRESSION: Probable small pleural effusions. No focal airspace disease. Electronically Signed   By: Donavan Foil M.D.   On: 02/21/2022 17:23    Procedures Procedures    Medications Ordered in ED Medications  ceFEPIme (MAXIPIME) 2 g in sodium chloride 0.9 % 100 mL IVPB (0 g Intravenous Stopped 02/21/22 1808)  vancomycin (VANCOREADY) IVPB 1500 mg/300 mL (1,500 mg Intravenous New Bag/Given 02/21/22 1811)  vancomycin (VANCOREADY) IVPB 1500 mg/300 mL (has no administration in time range)  sodium chloride 0.9 %  bolus 1,000 mL (0 mLs Intravenous Stopped 02/21/22 1904)  acetaminophen (TYLENOL) tablet 325 mg (325 mg Oral Given 02/21/22 1732)    ED Course/ Medical Decision Making/ A&P                             Medical Decision Making Amount and/or Complexity of Data Reviewed Labs: ordered. Radiology: ordered. ECG/medicine tests: ordered.  Risk OTC drugs. Prescription drug management. Decision regarding hospitalization.   [name] is here with fever, fatigue.  Patient febrile upon arrival.  He is little bit tachypneic.  Differential diagnosis includes sepsis which I suspect could be from a viral source but could be from pneumonia or UTI.  He did have TAVR procedure done 2 weeks ago.  Did have to spend the night in the hospital.  Will start broad-spectrum IV antibiotics given his recent invasive procedure with vancomycin and cefepime and will get blood cultures, chest x-ray, urine studies, lactic acid and COVID and flu testing.  He has no GI symptoms or abdominal pain.  Will give IV fluids and Tylenol and reevaluate.  Per my review and interpretation of labs he does have a mild AKI with a creatinine of 2.57.  Lactic acid is normal.  White count is normal.  Overall no source for infection.  Does not quite meet sepsis criteria however given his recent cardiac procedure I will admit for further observation to rule out occult bacteremia.  He has not given a urine yet not could still be a source.  May need extended viral panel as well.  I think it would be conservative to bring him in overnight and see if anything comes of his blood cultures.  May need echocardiogram.  Admitted to medicine team for further care.  At this time he does not meet sepsis criteria.  This chart was dictated using voice recognition software.  Despite best efforts to proofread,  errors can occur which can change the documentation meaning.         Final Clinical Impression(s) / ED Diagnoses Final diagnoses:  AKI  (acute kidney injury) ([place])  Fever, unspecified fever cause    Rx / DC Orders ED Discharge Orders     None         Lennice Sites, DO 02/21/22 1946

## 2022-02-21 NOTE — Assessment & Plan Note (Signed)
Creatinine 2.57 on admission compared to recent baseline around 1.8-2.0.  Appears volume depleted. -Gentle IV fluid hydration overnight -Obtain UA, monitor UOP -Repeat labs in a.m.

## 2022-02-21 NOTE — ED Notes (Signed)
ED TO INPATIENT HANDOFF REPORT  ED Nurse Name and Phone #: Rito Ehrlich, RN, [place]  S Name/Age/Gender [name] 89 y.o. male Room/Bed: 022C/022C  Code Status   Code Status: Prior  Home/SNF/Other Home Patient oriented to: self, place, time, and situation Is this baseline? Yes   Triage Complete: Triage complete  Chief Complaint Acute febrile illness [R50.9]  Triage Note Pt BIB [facility] from home due to lethargy, fever  and chills.  Pt had aortic valve replacement two weeks ago.  Pt has had low urine output since yesterday and not much PO intake at home per wife.  VS BP 138/68, Pulse 84, Resp 22, SpO2 96%, CBG 133.  18g left forearm.  35m NS.  6542mtylenol at 1616 by EMS.   Allergies Allergies  Allergen Reactions   Aspirin Nausea And Vomiting and Other (See Comments)    Upset stomach- tolerates coated aspirin    Lisinopril Anaphylaxis and Shortness Of Breath    After 3 tablets, he experienced trouble swallowing, throat tightness and hoarseness.    Amoxicillin Nausea Only   Atarax [Hydroxyzine Hcl] Nausea And Vomiting   Cephalexin Diarrhea   Ciprofloxacin Nausea Only   Clindamycin Other (See Comments)    Gi upset    Clobetasol Other (See Comments)    Not effective   Codeine Nausea Only and Other (See Comments)    Stomach upset   Fish Allergy Nausea And Vomiting   Fluarix [Influenza Virus Vaccine] Itching   Haemophilus Influenzae Itching   Hydrocodone Nausea Only   Hydrocodone-Acetaminophen Nausea And Vomiting   Hydroxyzine Nausea And Vomiting   Latex Itching and Other (See Comments)    (After flu shot)   Macrobid [Nitrofurantoin Macrocrystal] Nausea Only   Niacin Other (See Comments)    Unknown reaction   Niacin-Lovastatin Er Other (See Comments)    Unsure of reaction. Taking simvastatin at home without problems   Niacin-Lovastatin Er Other (See Comments)    Reaction not recalled   Nitrofurantoin Other (See Comments)    Upset stomach    Omeprazole  Other (See Comments)    Reaction not recalled- stopped by MD   Other Nausea And Vomiting   Tramadol Other (See Comments)    Unknown    Vibramycin [Doxycycline] Other (See Comments)    Reaction not recalled   Adhesive [Tape] Rash   Bactrim [Sulfamethoxazole-Trimethoprim] Rash   Colchicine Rash   Gabapentin Other (See Comments)    Painful pimples on tongue   Nortriptyline Rash    Level of Care/Admitting Diagnosis ED Disposition     ED Disposition  Admit   Condition  --   [id number]  Level of Care: Telemetry Cardiac [103]  [facility] equivalent level of care is available:: No  Covid Evaluation: Confirmed COVID Negative  Diagnosis: Acute febrile illness [[id number] Physician: PALenore [id number] Physician: PALenore [id number]        B Medical/Surgery History Past Medical History:  Diagnosis Date   Anemia    AV block, 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 S/P stenting to mid RCA, prox PDA 06/1999. b. NSTEMI/CABG x 3 in 10/2011 with LIMA to LAD, SVG to PDA, and SVG to OM1.    Cephalalgia 07/14/2015   CHB (complete heart block) 05/03/2012  Overview:  Status post complete heart block heart rate 28 bpm, alternating with 2 to one AV block and drug for bradycardia.  Status post pacemaker implantation.status post Medtronic pacemaker implantation the 03/28/2012   Chronic diastolic CHF (congestive heart failure) 02/05/2018   Chronic gouty arthritis    Chronic UTI    a. Followed by Dr. Risa Grill - colonized/asymptomatic - not on abx   CKD (chronic kidney disease)    stage 3, GFR 30-59 ml/min; stable with a creatinine around 1.9-2.0 followed by nephrology.   Constipation 03/01/2015   Symptoms and exam consistent with constipation. Abdominal exam is benign  with no evidence of pain or obstruction. Discussed importance of increasing fiber and water intake coupled with physical activity to assist with bowel movements. Continue over-the-counter medication management as needed. Follow-up if symptoms worsen or fail to improve.   Coronary atherosclerosis 11/27/2011   Overview:  Multivessel coronary artery disease recently diagnosed by catheterization 2013 History of stent placement with a heparin-coated stent 2001  Last Assessment & Plan:  Status post coronary bypass grafting.  No recurrent chest pain. Overview:  Overview:  S/P stenting to mid RCA, prox PDA 06/1999;  06/2007 Myoview: negative except for apical thinning, EF 68%, last Myoview August 10, 2008 with n   Degeneration of lumbar intervertebral disc    Degenerative disc disease, cervical, with radiculopathy [id number]   Diastolic dysfunction [id number]   Grade 1 DD on Echo 06/2017   Dyslipidemia    Dysphagia 02/06/2016   3/18 - DG Esophagus:  1. Mild esophageal dysmotility, likely presbyesophagus. 2. No other explanation for patient's symptoms. 3. Small hiatal hernia.  On swallow eval - evidence of cervical spine disease and that was likely contributing   Epistaxis 12/08/2020   Essential hypertension 07/20/2006   GERD (gastroesophageal reflux disease)    Gout 05/12/2018   Gouty arthritis of right great toe 09/03/2017   Left toe Injected January 31, 2018    Hyperpigmentation 11/04/2017   Internal hemorrhoids 08/15/2016   Left inguinal hernia 07/01/2019   Lumbar post-laminectomy syndrome 12/12/2017   Moderate aortic regurgitation 07/04/2017   Echo 06/2017:  EF 55-60%, mild LVH, grade 1 DD, mild AS, mod AR, mild MR, mild-mod TR   Neck injury    a. 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  This was switched to by mouth amiodarone at the time of the thoracic surgery appointment was discontinued.   Presence of aortocoronary bypass graft 10/24/2011   Overview:  Performed at [facility] 2013 Last Assessment & Plan:  No complication post coronary bypass grafting.   Prostate cancer ([place])    a. 2001 s/p TURP.   Pulmonary nodule    a. felt to be noncancerous.  Status post followup CT scan 4 mm and stable.   Renal artery stenosis    a. 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 Burt Knack & Dr. 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   Trochanteric bursitis of hip, bilateral 01/10/2017   UGIB (upper gastrointestinal bleed) 02/01/2018   EGD  02/06/18 - mod, non-erosive gastritis   Venous insufficiency of leg 06/05/2010   Vitamin B12 deficiency 08/23/2009   Jan '14  July '14 B12 level  >1500    472   Weakness of both lower extremities 04/09/2020   Past Surgical History:  Procedure Laterality Date   ANTERIOR CHAMBER WASHOUT Left 10/04/2018   Procedure: Anterior Chamber Washout, Vitreous Tap;  Surgeon: Jalene Mullet, MD;  Location: [place];  Service: Ophthalmology;  Laterality: Left;   cardia catherization  07-07-99   CARDIAC SURGERY  10/18/12   open heart surgery   CATARACT EXTRACTION W/ INTRAOCULAR LENS  IMPLANT, BILATERAL  3/205, 06/2013   [place]   COLONOSCOPY  04/12/07   CORONARY ARTERY BYPASS GRAFT  10/19/2011   Procedure: CORONARY ARTERY BYPASS GRAFTING (CABG);  Surgeon: Gaye Pollack, MD;  Location: [place];  Service: Open Heart Surgery;  Laterality: N/A;  times three using Left Internal Mammary Artery and Right Greater Saphenouse Vein Graft harvested Endoscopically   edg  07-17-1994   FLEXIBLE SIGMOIDOSCOPY  11-03-1997   GAS INSERTION Left 10/04/2018   Procedure: Insertion Of Gas;  Surgeon: Jalene Mullet, MD;  Location: [place];  Service: Ophthalmology;  Laterality: Left;   GAS/FLUID EXCHANGE Left 10/04/2018   Procedure: Gas/Fluid Exchange;  Surgeon: Jalene Mullet, MD;  Location: [place];  Service: Ophthalmology;  Laterality: Left;   INTRAOPERATIVE TRANSTHORACIC ECHOCARDIOGRAM N/A 02/06/2022   Procedure: INTRAOPERATIVE TRANSTHORACIC ECHOCARDIOGRAM;  Surgeon: [name], MD;  Location: [place] [facility];  Service: Open Heart Surgery;  Laterality: N/A;   LEFT HEART CATHETERIZATION WITH CORONARY ANGIOGRAM N/A 10/16/2011   Procedure: LEFT HEART CATHETERIZATION WITH CORONARY ANGIOGRAM;  Surgeon: Peter M Martinique, MD;  Location: [facility] CATH LAB;  Service: Cardiovascular;  Laterality: N/A;   LEFT HEART CATHETERIZATION WITH CORONARY/GRAFT ANGIOGRAM N/A 03/11/2013   Procedure: LEFT HEART CATHETERIZATION WITH [name];  Surgeon: Blane Ohara, MD;  Location: [facility] CATH LAB;  Service: Cardiovascular;  Laterality: N/A;   lumbar spinal disk and neck fusion surgery     PACEMAKER INSERTION  03/28/12   PPM implanted for mobitz II AV block   PARS PLANA VITRECTOMY Left 10/04/2018   Procedure: PARS PLANA VITRECTOMY 25 GAUGE FOR ENDOPHTHALMITIS WITH INJECTION OF INTRAVITREAL ANTIBIOTIC;  Surgeon: Jalene Mullet, MD;  Location: [place];  Service: Ophthalmology;  Laterality: Left;   peripheral vascular catherization  11-24-03   PERMANENT PACEMAKER INSERTION N/A 03/28/2012   Procedure: PERMANENT PACEMAKER INSERTION;  Surgeon: Thompson Grayer, MD;  Location[facility] C[facility] LAB;  Service: Cardiovascular;  Laterality: N/A;   PROSTATECTOMY     renal circulation  10-01-03   s/p ptca     stents     X 2   stress cardiolite   05-04-05   spring 09-negative except for apical thinning, EF 68%   TRANSCATHETER AORTIC VALVE REPLACEMENT, TRANSFEMORAL Right 02/06/2022   Procedure: Transcatheter Aortic Valve Replacement, Transfemoral;  Surgeon: [name], MD;  Location: [place] [facility];  Service: Open Heart Surgery;  Laterality: Right;     A IV Location/Drains/Wounds Patient Lines/Drains/Airways Status     Active Line/Drains/Airways     Name Placement date Placement time Site Days   Peripheral IV 02/21/22 18 G Anterior;Left Wrist 02/21/22  1639  Wrist  less than 1   Peripheral IV 02/21/22 20 G Anterior;Left  Forearm 02/21/22  1705  Forearm  less than 1            Intake/Output Last 24 hours No intake or output data in the 24 hours ending 02/21/22 2006  Labs/Imaging Results for orders placed or performed during the hospital encounter of 02/21/22 (from the past 48 hour(s))  Resp panel by RT-PCR (RSV, Flu A&B, Covid) Anterior Nasal Swab     Status: None   Collection Time: 02/21/22  5:05 PM   Specimen: Anterior Nasal Swab  Result Value Ref Range   SARS Coronavirus 2 by RT PCR NEGATIVE NEGATIVE   Influenza A by PCR NEGATIVE NEGATIVE   Influenza B by PCR NEGATIVE NEGATIVE    Comment: (NOTE) The Xpert Xpress SARS-CoV-2/FLU/RSV plus assay is intended as an aid in the diagnosis of influenza from Nasopharyngeal swab specimens and should not be used as a sole basis for treatment. Nasal washings and aspirates are unacceptable for Xpert Xpress SARS-CoV-2/FLU/RSV testing.  Fact Sheet for Patients: EntrepreneurPulse.com.au  Fact Sheet for Healthcare Providers: IncredibleEmployment.be  This test is not yet approved or cleared by the [place] FDA and has been authorized for detection and/or diagnosis of SARS-CoV-2 by FDA under an Emergency Use Authorization (EUA). This EUA will remain in effect (meaning this test can be used) for the duration of the COVID-19  declaration under Section 564(b)(1) of the Act, 21 U.S.C. section 360bbb-3(b)(1), unless the authorization is terminated or revoked.     Resp Syncytial Virus by PCR NEGATIVE NEGATIVE    Comment: (NOTE) Fact Sheet for Patients: EntrepreneurPulse.com.au  Fact Sheet for Healthcare Providers: IncredibleEmployment.be  This test is not yet approved or cleared by the [place] FDA and has been authorized for detection and/or diagnosis of SARS-CoV-2 by FDA under an Emergency Use Authorization (EUA). This EUA will remain in effect (meaning this test can be used) for the duration of the COVID-19 declaration under Section 564(b)(1) of the Act, 21 U.S.C. section 360bbb-3(b)(1), unless the authorization is terminated or revoked.  Performed at [facility], [place] [address] [place], [place] [address]   Lactic acid, plasma     Status: None   Collection Time: 02/21/22  5:07 PM  Result Value Ref Range   Lactic Acid, Venous 1.4 0.5 - 1.9 mmol/L    Comment: Performed at [facility] [address] [place], [place] [address]  Comprehensive metabolic panel     Status: Abnormal   Collection Time: 02/21/22  5:07 PM  Result Value Ref Range   Sodium 136 135 - 145 mmol/L   Potassium 3.8 3.5 - 5.1 mmol/L   Chloride 106 98 - 111 mmol/L   CO2 19 (L) 22 - 32 mmol/L   Glucose, Bld 137 (H) 70 - 99 mg/dL    Comment: Glucose reference range applies only to samples taken after fasting for at least 8 hours.   BUN 39 (H) 8 - 23 mg/dL   Creatinine, Ser 2.57 (H) 0.61 - 1.24 mg/dL   Calcium 9.4 8.9 - 10.3 mg/dL   Total Protein 6.8 6.5 - 8.1 g/dL   Albumin 3.8 3.5 - 5.0 g/dL   AST 43 (H) 15 - 41 U/L   ALT 21 0 - 44 U/L   Alkaline Phosphatase 58 38 - 126 U/L   Total Bilirubin 0.7 0.3 - 1.2 mg/dL   GFR, Estimated 23 (L) >60 mL/min    Comment: (NOTE) Calculated using the CKD-EPI Creatinine Equation (2021)    Anion gap 11 5 -  15    Comment: Performed at  [facility], [place] [address], [place], [place] [address]  CBC with Differential     Status: Abnormal   Collection Time: 02/21/22  5:07 PM  Result Value Ref Range   WBC 4.4 4.0 - 10.5 K/uL   RBC 3.67 (L) 4.22 - 5.81 MIL/uL   Hemoglobin 9.4 (L) 13.0 - 17.0 g/dL   HCT 29.4 (L) 39.0 - 52.0 %   MCV 80.1 80.0 - 100.0 fL   MCH 25.6 (L) 26.0 - 34.0 pg   MCHC 32.0 30.0 - 36.0 g/dL   RDW 15.8 (H) 11.5 - 15.5 %   Platelets 119 (L) 150 - 400 K/uL    Comment: REPEATED TO VERIFY   nRBC 0.0 0.0 - 0.2 %   Neutrophils Relative % 90 %   Neutro Abs 4.0 1.7 - 7.7 K/uL   Lymphocytes Relative 3 %   Lymphs Abs 0.1 (L) 0.7 - 4.0 K/uL   Monocytes Relative 5 %   Monocytes Absolute 0.2 0.1 - 1.0 K/uL   Eosinophils Relative 0 %   Eosinophils Absolute 0.0 0.0 - 0.5 K/uL   Basophils Relative 1 %   Basophils Absolute 0.0 0.0 - 0.1 K/uL   Immature Granulocytes 1 %   Abs Immature Granulocytes 0.02 0.00 - 0.07 K/uL    Comment: Performed at [facility], [place] [address] [place], [place] [address]  Protime-INR     Status: None   Collection Time: 02/21/22  5:07 PM  Result Value Ref Range   Prothrombin Time 15.2 11.4 - 15.2 seconds   INR 1.2 0.8 - 1.2    Comment: (NOTE) INR goal varies based on device and disease states. Performed at [facility], [place] [address] [place], [place] [address]   APTT     Status: None   Collection Time: 02/21/22  5:07 PM  Result Value Ref Range   aPTT 36 24 - 36 seconds    Comment: Performed at [facility] [address], [place], [place] [address]   *Note: Due to a large number of results and/or encounters for the requested time period, some results have not been displayed. A complete set of results can be found in Results Review.   DG Chest Port 1 View  Result Date: 02/21/2022 CLINICAL DATA:  Possible sepsis lethargy fever EXAM: PORTABLE CHEST 1 VIEW COMPARISON:  02/13/2022 FINDINGS: Left-sided dual lead pacing device as before. Valve prosthesis.  Status post sternotomy. No focal airspace disease. Probable small pleural effusions. Stable cardiomediastinal silhouette with aortic atherosclerosis. IMPRESSION: Probable small pleural effusions. No focal airspace disease. Electronically Signed   By: Donavan Foil M.D.   On: 02/21/2022 17:23    Pending Labs Unresulted Labs (From admission, onward)     Start     Ordered   02/21/22 1705  Lactic acid, plasma  (Septic presentation on arrival (screening labs, nursing and treatment orders for obvious sepsis))  Now then every 2 hours,   R      02/21/22 1705   02/21/22 1705  Blood Culture (routine x 2)  (Septic presentation on arrival (screening labs, nursing and treatment orders for obvious sepsis))  BLOOD CULTURE X 2,   STAT      02/21/22 1705   02/21/22 1705  Urinalysis, Routine w reflex microscopic -Urine, Clean Catch  (Septic presentation on arrival (screening labs, nursing and treatment orders for obvious sepsis))  ONCE - URGENT,   URGENT  Question:  Specimen Source  Answer:  Urine, Clean Catch   02/21/22 1705   02/21/22 1705  Urine Culture (for pregnant, neutropenic or urologic patients or patients with an indwelling urinary catheter)  (Septic presentation on arrival (screening labs, nursing and treatment orders for obvious sepsis))  Once,   URGENT       Question:  Indication  Answer:  Sepsis   02/21/22 1705            Vitals/Pain Today's Vitals   02/21/22 1730 02/21/22 1800 02/21/22 1811 02/21/22 1941  BP: (!) 136/58 (!) 126/111    Pulse: 69 80    Resp: 16 13    Temp:   99 F (37.2 C)   TempSrc:   Oral   SpO2: 95% 95%  96%  Weight:      Height:      PainSc:        Isolation Precautions No active isolations  Medications Medications  ceFEPIme (MAXIPIME) 2 g in sodium chloride 0.9 % 100 mL IVPB (0 g Intravenous Stopped 02/21/22 1808)  vancomycin (VANCOREADY) IVPB 1500 mg/300 mL (1,500 mg Intravenous New Bag/Given 02/21/22 1811)  vancomycin (VANCOREADY) IVPB 1500 mg/300 mL  (has no administration in time range)  sodium chloride 0.9 % bolus 1,000 mL (0 mLs Intravenous Stopped 02/21/22 1904)  acetaminophen (TYLENOL) tablet 325 mg (325 mg Oral Given 02/21/22 1732)    Mobility walks with person assist     Focused Assessments Neuro Assessment Handoff:  Swallow screen pass? Yes          Neuro Assessment:   Neuro Checks:      Has TPA been given? No If patient is a Neuro Trauma and patient is going to OR before floor call report to [place] nurse: [phone] or [phone]   R Recommendations: See Admitting Provider Note  Report given to:   Additional Notes: pt is AAOx4. Pt is on room air. Pt using urinal.

## 2022-02-21 NOTE — ED Triage Notes (Signed)
Pt BIB [facility] from [facility] due to lethargy, fever  and chills.  Pt had aortic valve replacement two weeks ago.  Pt has had low urine output since yesterday and not much PO intake at home per wife.  VS BP 138/68, Pulse 84, Resp 22, SpO2 96%, CBG 133.  18g left forearm.  370m NS.  6532mtylenol at 1616 by EMS.

## 2022-02-21 NOTE — Assessment & Plan Note (Signed)
Mild without obvious bleeding.  Continue to monitor.

## 2022-02-21 NOTE — Assessment & Plan Note (Addendum)
S/p TAVR 02/06/2022 by Dr. Burt Knack.  Obtaining repeat echocardiogram given febrile illness as above.  Continue aspirin.

## 2022-02-21 NOTE — Hospital Course (Signed)
[name] is a 89 y.o. male with medical history significant for CAD s/p CABG, HFpEF (EF 55-60%), severe aortic stenosis s/p TAVR (02/06/2022), symptomatic bradycardia s/p PPM, CKD stage IIIb, anemia of chronic disease, HTN, HLD, prostate cancer who is admitted with acute febrile illness and AKI on CKD stage IIIb.

## 2022-02-21 NOTE — Assessment & Plan Note (Addendum)
Fever up to 103 F on arrival.  No obvious infectious source.  Given recent TAVR will keep on empiric antibiotics pending further workup.  Sepsis not present on admission. -Continue empiric IV vancomycin and cefepime -Follow blood cultures -Follow UA and urine culture -COVID, flu, RSV negative -Obtain full RVP -Obtain echocardiogram

## 2022-02-21 NOTE — Assessment & Plan Note (Signed)
Hemoglobin stable.  Continue to monitor.

## 2022-02-21 NOTE — Progress Notes (Signed)
Pharmacy Antibiotic Note  [name] is a 89 y.o. male admitted on 02/21/2022 with sepsis.  Pharmacy has been consulted for vancomycin and cefepime dosing. Tmax 103F. Scr 1.98 appears to be at baseline.   Plan: Vancomycin 1500 mg IV q48h (eAUC 541.6, Scr 1.98, Vd 0.72)  Cefepime 2g IV q24h Monitor C&S, s/sx improvement, renal function, vancomycin levels at [facility] as indicated    Height: 5' 10"$  (177.8 cm) Weight: 78.9 kg (174 lb) IBW/kg (Calculated) : 73  Temp (24hrs), Avg:103 F (39.4 C), Min:103 F (39.4 C), Max:103 F (39.4 C)  No results for input(s): "WBC", "CREATININE", "LATICACIDVEN", "VANCOTROUGH", "VANCOPEAK", "VANCORANDOM", "GENTTROUGH", "GENTPEAK", "GENTRANDOM", "TOBRATROUGH", "TOBRAPEAK", "TOBRARND", "AMIKACINPEAK", "AMIKACINTROU", "AMIKACIN" in the last 168 hours.  Estimated Creatinine Clearance: 24.1 mL/min (A) (by C-G formula based on SCr of 1.98 mg/dL (H)).    Allergies  Allergen Reactions   Aspirin Nausea And Vomiting and Other (See Comments)    Upset stomach- tolerates coated aspirin    Lisinopril Anaphylaxis and Shortness Of Breath    After 3 tablets, he experienced trouble swallowing, throat tightness and hoarseness.    Amoxicillin Nausea Only   Atarax [Hydroxyzine Hcl] Nausea And Vomiting   Cephalexin Diarrhea   Ciprofloxacin Nausea Only   Clindamycin Other (See Comments)    Gi upset    Clobetasol Other (See Comments)    Not effective   Codeine Nausea Only and Other (See Comments)    Stomach upset   Fish Allergy Nausea And Vomiting   Fluarix [Influenza Virus Vaccine] Itching   Haemophilus Influenzae Itching   Hydrocodone Nausea Only   Hydrocodone-Acetaminophen Nausea And Vomiting   Hydroxyzine Nausea And Vomiting   Latex Itching and Other (See Comments)    (After flu shot)   Macrobid [Nitrofurantoin Macrocrystal] Nausea Only   Niacin Other (See Comments)    Unknown reaction   Niacin-Lovastatin Er Other (See Comments)    Unsure of reaction.  Taking simvastatin at home without problems   Niacin-Lovastatin Er Other (See Comments)    Reaction not recalled   Nitrofurantoin Other (See Comments)    Upset stomach    Omeprazole Other (See Comments)    Reaction not recalled- stopped by MD   Other Nausea And Vomiting   Tramadol Other (See Comments)    Unknown    Vibramycin [Doxycycline] Other (See Comments)    Reaction not recalled   Adhesive [Tape] Rash   Bactrim [Sulfamethoxazole-Trimethoprim] Rash   Colchicine Rash   Gabapentin Other (See Comments)    Painful pimples on tongue   Nortriptyline Rash    Antimicrobials this admission: 2/14 vancomycin >>  2/14 cefepime >>   Dose adjustments this admission:   Microbiology results: 2/14 BCx: sent 2/14 UCx: sent  2/14 RVP: sent  Thank you for allowing pharmacy to be a part of this patient's care.  Eliseo Gum, PharmD PGY1 Pharmacy Resident   02/21/2022  5:33 PM

## 2022-02-21 NOTE — H&P (Signed)
History and Physical    [name] [id number] DOB: Jun 01, 1926 DOA: 02/21/2022  PCP: [name], MD  Patient coming from: Home  I have personally briefly reviewed patient's old medical records in [place]  Chief Complaint: Fever  HPI: [name] is a 89 y.o. male with medical history significant for CAD s/p CABG, HFpEF (EF 55-60%), severe aortic stenosis s/p TAVR (02/06/2022), symptomatic bradycardia s/p PPM, CKD stage IIIb, anemia of chronic disease, HTN, HLD, prostate cancer who presents to the ED for evaluation of fevers.  Patient recently underwent TAVR on 02/06/2022.  He was doing fairly well postoperatively.  3 days ago he began to have significant fatigue, chills, body aches, and poor oral intake.  He has been mostly in bed during this time.  He notes intermittent uncontrollable body jerking movement mostly affecting his legs but occasionally his arms.  He says this has been ongoing since November.  Also has been having some issues with memory since that time.  He has had nonproductive cough.  He denies any nausea, vomiting, abdominal pain, chest pain, dysuria.  ED Course  Labs/Imaging on admission: I have personally reviewed following labs and imaging studies.  Initial vitals showed BP 144/61, pulse 78, RR 22, temp 103.0 F, SpO2 96% on room air.  Labs show WBC 4.4, hemoglobin 9.4, platelets 119,000, sodium 136, potassium 3.8, bicarb 19, BUN 39, creatinine 2.57, serum glucose 137, lactic acid 1.4.  COVID, influenza, RSV PCR negative.  UA and urine culture pending collection.  Blood cultures in process.  Portable chest x-ray shows probable small pleural effusions.  Left-sided pacemaker, valve prosthesis, and poststernotomy changes noted.  No focal consolidation.  Patient was given 1 L normal saline, IV vancomycin and cefepime.  The hospitalist service was consulted to admit for further evaluation and management.  Review of Systems: All systems  reviewed and are negative except as documented in history of present illness above.   Past Medical History:  Diagnosis Date   Anemia    AV block, 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 S/P stenting to mid RCA, prox PDA 06/1999. b. NSTEMI/CABG x 3 in 10/2011 with LIMA to LAD, SVG to PDA, and SVG to OM1.    Cephalalgia 07/14/2015   CHB (complete heart block) 05/03/2012   Overview:  Status post complete heart block heart rate 28 bpm, alternating with 2 to one AV block and drug for bradycardia.  Status post pacemaker implantation.status post Medtronic pacemaker implantation the 03/28/2012   Chronic diastolic CHF (congestive heart failure) 02/05/2018   Chronic gouty arthritis    Chronic UTI    a. Followed by Dr. Risa Grill - colonized/asymptomatic - not on abx   CKD (chronic kidney disease)    stage 3, GFR 30-59 ml/min; stable with a creatinine around 1.9-2.0 followed by nephrology.   Constipation 03/01/2015   Symptoms and exam consistent with constipation. Abdominal exam is benign with no evidence of pain or obstruction. Discussed importance of increasing fiber and water intake coupled with physical activity to assist with bowel movements. Continue over-the-counter medication management as needed. Follow-up if symptoms worsen or fail to improve.   Coronary atherosclerosis 11/27/2011   Overview:  Multivessel coronary artery disease recently diagnosed by catheterization 2013 History of stent placement with a heparin-coated stent 2001  Last Assessment & Plan:  Status post coronary bypass grafting.  No recurrent chest pain. Overview:  Overview:  S/P stenting to mid RCA, prox PDA 06/1999;  06/2007 Myoview: negative except for apical thinning, EF 68%, last Myoview August 10, 2008 with n   Degeneration of lumbar intervertebral disc    Degenerative disc disease, cervical, with radiculopathy [id number]   Diastolic dysfunction  99991111   Grade 1 DD on Echo 06/2017   Dyslipidemia    Dysphagia 02/06/2016   3/18 - DG Esophagus:  1. Mild esophageal dysmotility, likely presbyesophagus. 2. No other explanation for patient's symptoms. 3. Small hiatal hernia.  On swallow eval - evidence of cervical spine disease and that was likely contributing   Epistaxis 12/08/2020   Essential hypertension 07/20/2006   GERD (gastroesophageal reflux disease)    Gout 05/12/2018   Gouty arthritis of right great toe 09/03/2017   Left toe Injected January 31, 2018    Hyperpigmentation 11/04/2017   Internal hemorrhoids 08/15/2016   Left inguinal hernia 07/01/2019   Lumbar post-laminectomy syndrome 12/12/2017   Moderate aortic regurgitation 07/04/2017   Echo 06/2017:  EF 55-60%, mild LVH, grade 1 DD, mild AS, mod AR, mild MR, mild-mod TR   Neck injury    a. 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  This was switched to by mouth amiodarone at the time of the thoracic surgery appointment was discontinued.   Presence of aortocoronary bypass graft 10/24/2011   Overview:  Performed at [facility] 2013 Last Assessment & Plan:  No complication post coronary bypass grafting.   Prostate cancer ([place])    a. 2001 s/p TURP.   Pulmonary nodule    a. felt to be noncancerous.  Status post followup CT scan 4 mm and stable.   Renal artery stenosis    a. 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  Burt Knack & Dr. 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   Trochanteric bursitis of hip, bilateral 01/10/2017   UGIB (upper gastrointestinal bleed) 02/01/2018   EGD  02/06/18 - mod, non-erosive gastritis   Venous insufficiency of leg 06/05/2010   Vitamin B12 deficiency 08/23/2009   Jan '14  July '14 B12 level  >1500    472   Weakness of both lower extremities 04/09/2020    Past Surgical History:  Procedure Laterality Date   ANTERIOR CHAMBER WASHOUT Left 10/04/2018   Procedure: Anterior Chamber Washout, Vitreous Tap;  Surgeon: Jalene Mullet, MD;  Location: [place];  Service: Ophthalmology;  Laterality: Left;   cardia catherization  07-07-99   CARDIAC SURGERY  10/18/12   open heart surgery   CATARACT EXTRACTION W/ INTRAOCULAR LENS  IMPLANT, BILATERAL  3/205, 06/2013   [facility]   COLONOSCOPY  04/12/07   CORONARY ARTERY BYPASS GRAFT  10/19/2011   Procedure: CORONARY ARTERY BYPASS GRAFTING (CABG);  Surgeon: Gaye Pollack, MD;  Location: [place];  Service: Open Heart Surgery;  Laterality: N/A;  times three using  Left Internal Mammary Artery and Right Greater Saphenouse Vein Graft harvested Endoscopically   edg  07-17-1994   FLEXIBLE SIGMOIDOSCOPY  11-03-1997   GAS INSERTION Left 10/04/2018   Procedure: Insertion Of Gas;  Surgeon: Jalene Mullet, MD;  Location: [place];  Service: Ophthalmology;  Laterality: Left;   GAS/FLUID EXCHANGE Left 10/04/2018   Procedure: Gas/Fluid Exchange;  Surgeon: Jalene Mullet, MD;  Location: [place];  Service: Ophthalmology;  Laterality: Left;   INTRAOPERATIVE TRANSTHORACIC ECHOCARDIOGRAM N/A 02/06/2022   Procedure: INTRAOPERATIVE TRANSTHORACIC ECHOCARDIOGRAM;  Surgeon: [name], MD;  Location: [place] [facility];  Service: Open Heart Surgery;  Laterality: N/A;   LEFT HEART CATHETERIZATION WITH CORONARY ANGIOGRAM N/A 10/16/2011   Procedure: LEFT HEART  CATHETERIZATION WITH CORONARY ANGIOGRAM;  Surgeon: Peter M Martinique, MD;  Location: [facility] CATH LAB;  Service: Cardiovascular;  Laterality: N/A;   LEFT HEART CATHETERIZATION WITH CORONARY/GRAFT ANGIOGRAM N/A 03/11/2013   Procedure: LEFT HEART CATHETERIZATION WITH [name];  Surgeon: Blane Ohara, MD;  Location: [facility] CATH LAB;  Service: Cardiovascular;  Laterality: N/A;   lumbar spinal disk and neck fusion surgery     PACEMAKER INSERTION  03/28/12   PPM implanted for mobitz II AV block   PARS PLANA VITRECTOMY Left 10/04/2018   Procedure: PARS PLANA VITRECTOMY 25 GAUGE FOR ENDOPHTHALMITIS WITH INJECTION OF INTRAVITREAL ANTIBIOTIC;  Surgeon: Jalene Mullet, MD;  Location: [place];  Service: Ophthalmology;  Laterality: Left;   peripheral vascular catherization  11-24-03   PERMANENT PACEMAKER INSERTION N/A 03/28/2012   Procedure: PERMANENT PACEMAKER INSERTION;  Surgeon: Thompson Grayer, MD;  Location: [facility] CATH LAB;  Service: Cardiovascular;  Laterality: N/A;   PROSTATECTOMY     renal circulation  10-01-03   s/p ptca     stents     X 2   stress cardiolite  05-04-05   spring 09-negative except for apical thinning, EF 68%   TRANSCATHETER AORTIC VALVE REPLACEMENT, TRANSFEMORAL Right 02/06/2022   Procedure: Transcatheter Aortic Valve Replacement, Transfemoral;  Surgeon: [name], MD;  Location: [place] [facility];  Service: Open Heart Surgery;  Laterality: Right;    Social History:  reports that he has never smoked. He has never used smokeless tobacco. He reports that he does not currently use alcohol. He reports that he does not use drugs.  Allergies  Allergen Reactions   Aspirin Nausea And Vomiting and Other (See Comments)    Upset stomach- tolerates coated aspirin    Lisinopril Anaphylaxis and Shortness Of Breath    After 3 tablets, he experienced trouble swallowing, throat tightness and hoarseness.    Amoxicillin Nausea Only   Atarax [Hydroxyzine Hcl] Nausea And Vomiting    Cephalexin Diarrhea   Ciprofloxacin Nausea Only   Clindamycin Other (See Comments)    Gi upset    Clobetasol Other (See Comments)    Not effective   Codeine Nausea Only and Other (See Comments)    Stomach upset   Fish Allergy Nausea And Vomiting   Fluarix [Influenza Virus Vaccine] Itching   Haemophilus Influenzae Itching   Hydrocodone Nausea Only   Hydrocodone-Acetaminophen Nausea And Vomiting   Hydroxyzine Nausea And Vomiting   Latex Itching and Other (See Comments)    (After flu shot)   Macrobid [Nitrofurantoin Macrocrystal] Nausea Only   Niacin Other (See Comments)    Unknown reaction   Niacin-Lovastatin Er Other (See Comments)    Unsure of reaction. Taking simvastatin at home without problems   Niacin-Lovastatin Er Other (See Comments)    Reaction not recalled  Nitrofurantoin Other (See Comments)    Upset stomach    Omeprazole Other (See Comments)    Reaction not recalled- stopped by MD   Other Nausea And Vomiting   Tramadol Other (See Comments)    Unknown    Vibramycin [Doxycycline] Other (See Comments)    Reaction not recalled   Adhesive [Tape] Rash   Bactrim [Sulfamethoxazole-Trimethoprim] Rash   Colchicine Rash   Gabapentin Other (See Comments)    Painful pimples on tongue   Nortriptyline Rash    Family History  Problem Relation Age of Onset   Coronary artery disease Father        died @ 18   Other Mother        cerebral hemorrhage - died @ 85   Arthritis Mother    Cancer Brother        Bladder   Prostate cancer Brother    Nephritis Brother        died @ age 73.   Other Brother        cerebral hemorrhage - died @ 47   Heart disease Brother    Breast cancer Other        niece x 2   Diabetes Neg Hx    Colon cancer Neg Hx    Adrenal disorder Neg Hx      Prior to Admission medications   Medication Sig Start Date End Date Taking? Authorizing Provider  acetaminophen (TYLENOL) 500 MG tablet Take 1,000 mg by mouth 3 (three) times daily as needed for  moderate pain.    [provider]  allopurinol (ZYLOPRIM) 100 MG tablet TAKE 1 TABLET(100 MG) BY MOUTH DAILY Patient taking differently: Take 100 mg by mouth daily. TAKE 1 TABLET(100 MG) BY MOUTH DAILY 12/04/21   [name], MD  amLODipine (NORVASC) 10 MG tablet Take 1 tablet (10 mg total) by mouth daily. 02/07/22   Eileen Stanford, PA-C  amoxicillin (AMOXIL) 500 MG tablet TAKE 4 TABLETS (2000 MG) BY MOUTH 1 HOUR PRIOR TO DENTAL APPOINTMENTS AND PROCEDURES 02/14/22   Tommie Raymond, NP  Aromatic Inhalants (VICKS VAPOR INHALER IN) Place 1 puff into both nostrils as needed (for congestion).    [provider]  Ascorbic Acid (VITAMIN C) 1000 MG tablet Take 1,000 mg by mouth daily.    [provider]  aspirin EC 81 MG tablet Take 81 mg by mouth daily.    [provider]  Calcium Citrate-Vitamin D (CITRACAL + D PO) Take 2 tablets by mouth in the morning and at bedtime.    [provider]  Carboxymeth-Glycerin-Polysorb (REFRESH OPTIVE MEGA-3 OP) Place 1 drop into both eyes 2 (two) times daily.    [provider]  Cholecalciferol 25 MCG (1000 UT) capsule Take 1,000 Units by mouth daily.    [provider]  cyanocobalamin (,VITAMIN B-12,) 1000 MCG/ML injection Inject 1,000 mcg into the muscle once. Monthly injection    [provider]  folic acid (FOLVITE) 1 MG tablet Take 1 tablet (1 mg total) by mouth daily. Annual appt due in May must see provider for future refills Patient taking differently: Take 1 mg by mouth at bedtime. Annual appt due in May must see provider for future refills 12/04/21   [name], MD  furosemide (LASIX) 40 MG tablet TAKE 1 TABLET BY MOUTH EVERY DAY Patient taking differently: Take 40 mg by mouth as needed for fluid or edema. 04/05/21   [name], MD  hydrALAZINE (APRESOLINE) 50  MG tablet Take 1 tablet (50 mg total) by mouth 3 (three) times daily. 02/08/22   Eileen Stanford, PA-C  loratadine  (CLARITIN) 10 MG tablet Take 10 mg by mouth daily as needed (for seasonal allergies).    [provider]  Multiple Vitamins-Minerals (PRESERVISION AREDS 2 PO) Take 1 tablet by mouth in the morning and at bedtime.    [provider]  nitroGLYCERIN (NITROSTAT) 0.4 MG SL tablet DISSOLVE 1 TABLET UNDER THE TONGUE EVERY 5 MINUTES AS NEEDED FOR CHEST PAIN, MAXIMUM 3 TABLETS Patient taking differently: Place 0.4 mg under the tongue every 5 (five) minutes as needed for chest pain. 04/30/19   [name], MD  Saline (AYR NASAL MIST ALLERGY/SINUS NA) Place 2 sprays into the nose as needed (dryness).    [provider]  Vitamin D, Ergocalciferol, (DRISDOL) 1.25 MG (50000 UNIT) CAPS capsule Take by mouth. 07/13/21   [provider]    Physical Exam: Vitals:   02/21/22 1800 02/21/22 1811 02/21/22 1941 02/21/22 2000  BP: (!) 126/111   137/85  Pulse: 80   63  Resp: 13   11  Temp:  99 F (37.2 C)    TempSrc:  Oral    SpO2: 95%  96% 99%  Weight:      Height:       Constitutional: Resting in bed, appears fatigued but in NAD, calm, comfortable Eyes: EOMI, lids and conjunctivae normal ENMT: Mucous membranes are moist. Posterior pharynx clear of any exudate or lesions.Normal dentition.  Neck: normal, supple, no masses. Respiratory: clear to auscultation bilaterally, no wheezing, no crackles. Normal respiratory effort. No accessory muscle use.  Cardiovascular: Regular rate and rhythm, no murmurs / rubs / gallops. No extremity edema. 2+ pedal pulses. Abdomen: no tenderness, no masses palpated.  Musculoskeletal: no clubbing / cyanosis. No joint deformity upper and lower extremities. Good ROM, no contractures. Normal muscle tone.  Skin: no rashes, lesions, ulcers. No induration Neurologic: Sensation intact. Strength 5/5 in all 4.  Psychiatric: Alert and oriented x to person, place, situation, year but does repeatedly ask what is the current day.  EKG: Personally reviewed.  Sinus rhythm, first-degree AV block, RBBB and LAFB.  Similar to prior.  Assessment/Plan Principal Problem:   Acute febrile illness Active Problems:   Acute kidney injury superimposed on chronic kidney disease stage IIIb (HCC)   S/P TAVR (transcatheter aortic valve replacement)   (HFpEF) heart failure with preserved ejection fraction (HCC)   Essential hypertension   Anemia of chronic disease   Dyslipidemia   CAD (coronary artery disease)   Thrombocytopenia (HCC)   [name] is a 89 y.o. male with medical history significant for CAD s/p CABG, HFpEF (EF 55-60%), severe aortic stenosis s/p TAVR (02/06/2022), symptomatic bradycardia s/p PPM, CKD stage IIIb, anemia of chronic disease, HTN, HLD, prostate cancer who is admitted with acute febrile illness and AKI on CKD stage IIIb.  Assessment and Plan: * Acute febrile illness Fever up to 103 F on arrival.  No obvious infectious source.  Given recent TAVR will keep on empiric antibiotics pending further workup.  Sepsis not present on admission. -Continue empiric IV vancomycin and cefepime -Follow blood cultures -Follow UA and urine culture -COVID, flu, RSV negative -Obtain full RVP -Obtain echocardiogram  Acute kidney injury superimposed on chronic kidney disease stage IIIb (HCC) Creatinine 2.57 on admission compared to recent baseline around 1.8-2.0.  Appears volume depleted. -Gentle IV fluid hydration overnight -Obtain UA, monitor UOP -Repeat labs in a.m.  S/P TAVR (transcatheter aortic valve replacement) S/p TAVR 02/06/2022 by Dr. Burt Knack.  Obtaining repeat echocardiogram given febrile illness as above.  Continue aspirin.  (HFpEF) heart failure with preserved ejection fraction ([place]) Appears hypovolemic on admission.  Holding diuretics and placed on gentle IV fluid hydration overnight.  Anemia of chronic disease Hemoglobin stable.  Continue to monitor.  Essential hypertension Continue amlodipine and  hydralazine.  Dyslipidemia Not on statin presumably due to history of myalgias.  Thrombocytopenia (HCC) Mild without obvious bleeding.  Continue to monitor.  CAD (coronary artery disease) Denies chest pain.  Continue aspirin.  Not on statin presumably due to history of myalgias.  DVT prophylaxis: heparin injection 5,000 Units Start: 02/21/22 2200 Code Status: Full code, confirmed with patient on admission Family Communication: Spouse at bedside Disposition Plan: From home, dispo pending clinical progress Consults called: None Severity of Illness: The appropriate patient status for this patient is OBSERVATION. Observation status is judged to be reasonable and necessary in order to provide the required intensity of service to ensure the patient's safety. The patient's presenting symptoms, physical exam findings, and initial radiographic and laboratory data in the context of their medical condition is felt to place them at decreased risk for further clinical deterioration. Furthermore, it is anticipated that the patient will be medically stable for discharge from the hospital within 2 midnights of admission.   Zada Finders MD [facility]  If 7PM-7AM, please contact night-coverage [email or web address]  02/21/2022, 8:38 PM

## 2022-02-21 NOTE — Assessment & Plan Note (Signed)
Not on statin presumably due to history of myalgias.

## 2022-02-22 ENCOUNTER — Encounter (HOSPITAL_COMMUNITY): Payer: Self-pay | Admitting: Internal Medicine

## 2022-02-22 ENCOUNTER — Observation Stay (HOSPITAL_BASED_OUTPATIENT_CLINIC_OR_DEPARTMENT_OTHER): Payer: Medicare Other

## 2022-02-22 DIAGNOSIS — M7989 Other specified soft tissue disorders: Secondary | ICD-10-CM | POA: Diagnosis not present

## 2022-02-22 DIAGNOSIS — R52 Pain, unspecified: Secondary | ICD-10-CM

## 2022-02-22 DIAGNOSIS — R509 Fever, unspecified: Secondary | ICD-10-CM

## 2022-02-22 LAB — BASIC METABOLIC PANEL
Anion gap: 12 (ref 5–15)
BUN: 38 mg/dL — ABNORMAL HIGH (ref 8–23)
CO2: 17 mmol/L — ABNORMAL LOW (ref 22–32)
Calcium: 8.4 mg/dL — ABNORMAL LOW (ref 8.9–10.3)
Chloride: 107 mmol/L (ref 98–111)
Creatinine, Ser: 2.34 mg/dL — ABNORMAL HIGH (ref 0.61–1.24)
GFR, Estimated: 25 mL/min — ABNORMAL LOW (ref >60)
Glucose, Bld: 98 mg/dL (ref 70–99)
Potassium: 3.6 mmol/L (ref 3.5–5.1)
Sodium: 136 mmol/L (ref 135–145)

## 2022-02-22 LAB — CBC
HCT: 26.9 % — ABNORMAL LOW (ref 39.0–52.0)
Hemoglobin: 8.9 g/dL — ABNORMAL LOW (ref 13.0–17.0)
MCH: 25.8 pg — ABNORMAL LOW (ref 26.0–34.0)
MCHC: 33.1 g/dL (ref 30.0–36.0)
MCV: 78 fL — ABNORMAL LOW (ref 80.0–100.0)
Platelets: 103 10*3/uL — ABNORMAL LOW (ref 150–400)
RBC: 3.45 MIL/uL — ABNORMAL LOW (ref 4.22–5.81)
RDW: 15.9 % — ABNORMAL HIGH (ref 11.5–15.5)
WBC: 3.3 10*3/uL — ABNORMAL LOW (ref 4.0–10.5)
nRBC: 0 % (ref 0.0–0.2)

## 2022-02-22 LAB — ECHOCARDIOGRAM COMPLETE
AR max vel: 3.05 cm2
AV Area VTI: 3 cm2
AV Area mean vel: 3.13 cm2
AV Mean grad: 7.5 mmHg
AV Peak grad: 14.9 mmHg
Ao pk vel: 1.93 m/s
Area-P 1/2: 4.26 cm2
Calc EF: 50.7 %
Height: 70 in
S' Lateral: 3.8 cm
Single Plane A2C EF: 52.7 %
Single Plane A4C EF: 54.3 %
Weight: 2783.09 oz

## 2022-02-22 LAB — URINE CULTURE: Culture: <10000 — AB

## 2022-02-22 MED ORDER — AZITHROMYCIN 250 MG PO TABS: ORAL_TABLET | ORAL | 0 refills | Status: AC

## 2022-02-22 MED ORDER — LIP MEDEX EX OINT: TOPICAL_OINTMENT | CUTANEOUS | Status: DC | PRN

## 2022-02-22 MED ORDER — TROLAMINE SALICYLATE 10 % EX CREA
TOPICAL_CREAM | Freq: Two times a day (BID) | CUTANEOUS | Status: DC | PRN
  Filled 2022-02-22: qty 85

## 2022-02-22 MED ORDER — MUSCLE RUB 10-15 % EX CREA
TOPICAL_CREAM | Freq: Two times a day (BID) | CUTANEOUS | Status: DC | PRN
  Filled 2022-02-22: qty 85

## 2022-02-22 NOTE — Care Management Obs Status (Signed)
[place] NOTIFICATION   Patient Details  Name: KEADEN WITTER MRN: [id number] Date of Birth: [birth year]   Medicare Observation Status Notification Given:  Yes    Bethena Roys, RN 02/22/2022, 4:06 PM

## 2022-02-22 NOTE — Progress Notes (Signed)
Upper extremity venous left study completed.  Preliminary results relayed to Bonner Puna, MD.   See CV Proc for preliminary results report.   Darlin Coco, RDMS, RVT

## 2022-02-22 NOTE — Progress Notes (Signed)
  Echocardiogram 2D Echocardiogram has been performed.  [name] 02/22/2022, 11:27 AM

## 2022-02-22 NOTE — Discharge Summary (Signed)
Physician Discharge Summary   Patient: [name] MRN: [id number] DOB: Dec 24, 1926  Admit date:     02/21/2022  Discharge date: 02/22/2022  Discharge Physician: Patrecia Pour   PCP: [name], MD   Recommendations at discharge:  Follow up with PCP in the next week for post hospital follow up. Suggest repeat BMP and to follow up blood culture results (discharging physician will also follow these up and contact pt if anything turns positive).   Discharge Diagnoses: Principal Problem:   Acute febrile illness Active Problems:   Acute kidney injury superimposed on chronic kidney disease stage IIIb (HCC)   S/P TAVR (transcatheter aortic valve replacement)   (HFpEF) heart failure with preserved ejection fraction (HCC)   Essential hypertension   Anemia of chronic disease   Dyslipidemia   CAD (coronary artery disease)   Thrombocytopenia [facility])   Hospital Course: JMARCUS BLOEMKER is a 89 y.o. male with medical history significant for CAD s/p CABG, HFpEF (EF 55-60%), severe aortic stenosis s/p TAVR (02/06/2022), symptomatic bradycardia s/p PPM, CKD stage IIIb, anemia of chronic disease, HTN, HLD, prostate cancer who is admitted with acute febrile illness and AKI on CKD stage IIIb.  He was observed with blood cultures drawn, empiric antibiotics given, IV fluids administered with improvement in renal function and no further fevers. Infectious work up unrevealing.  Assessment and Plan: Acute febrile illness: Given URI symptoms predating presentation, viral URI is lead consideration, though viral panels negative here. Other work up also unrevealing. Given recent TAVR, pt was observed with blood cultures taken, empirically on abx. Will continue azithromycin for possibility of atypical infection, though pt is aware haven't located a definitive source at this time.  - Full RVP negative, covid/RSV/flu panel negative. WBC 4.4 > 3.3k w/ALC 100.  - Blood cultures NGTD at discharge - Urinalysis  not consistent with infection and culture with insignificant growth.  - No signs or symptoms of DVT/PE (other than LUE swelling related to confirmed superficial thrombosis).   Acute kidney injury superimposed on chronic kidney disease stage IIIb (HCC) Creatinine 2.57 on admission compared to recent baseline around 1.8-2.0.  Appeared volume depleted, hyaline casts, 100 protein, no RBCs or WBCs on UA micro. Given IVF with slight improvement in Cr. Pt's appetite is still impaired but improving and he requests discharge home as food there is more appetizing than food here. He certainly has no nausea or vomiting to limit ability to take po.    S/P TAVR (transcatheter aortic valve replacement) S/p TAVR 02/06/2022 by Dr. Burt Knack. Stable by echo. No vegetation/abscess.  (HFpEF) heart failure with preserved ejection fraction ([place]) Appears hypovolemic on admission. Improved, can restart home medications after discharge.  Anemia of chronic disease Hemoglobin stable.  Continue to monitor.  Essential hypertension Continue amlodipine and hydralazine.  Dyslipidemia Not on statin presumably due to history of myalgias.  Thrombocytopenia (HCC) Mild without obvious bleeding. Noted to have been present prior to illness.   CAD (coronary artery disease) Denies chest pain.  Continue aspirin.  Not on statin presumably due to history of myalgias.  Acute left basilic (superficial) vein thrombosis: No DVT by U/S.  - Warm compress, elevation  Consultants: None Procedures performed: Echo, UE venous U/S  Disposition: Home Diet recommendation:  Cardiac diet DISCHARGE MEDICATION: Allergies as of 02/22/2022       Reactions   Aspirin Nausea And Vomiting, Other (See Comments)   Upset stomach- tolerates coated aspirin   Lisinopril Anaphylaxis, Shortness Of Breath   After 3  tablets, he experienced trouble swallowing, throat tightness and hoarseness.    Amoxicillin Nausea Only   Atarax [hydroxyzine Hcl] Nausea  And Vomiting   Cephalexin Diarrhea   Ciprofloxacin Nausea Only   Clindamycin Other (See Comments)   Gi upset    Clobetasol Other (See Comments)   Not effective   Codeine Nausea Only, Other (See Comments)   Stomach upset   Fish Allergy Nausea And Vomiting   Fluarix [influenza Virus Vaccine] Itching   Haemophilus Influenzae Itching   Hydrocodone Nausea Only   Hydrocodone-acetaminophen Nausea And Vomiting   Hydroxyzine Nausea And Vomiting   Latex Itching, Other (See Comments)   (After flu shot)   Macrobid [nitrofurantoin Macrocrystal] Nausea Only   Niacin Other (See Comments)   Unknown reaction   Niacin-lovastatin Er Other (See Comments)   Unsure of reaction. Taking simvastatin at home without problems   Niacin-lovastatin Er Other (See Comments)   Reaction not recalled   Nitrofurantoin Other (See Comments)   Upset stomach   Omeprazole Other (See Comments)   Reaction not recalled- stopped by MD   Other Nausea And Vomiting   Tramadol Other (See Comments)   Unknown    Vibramycin [doxycycline] Other (See Comments)   Reaction not recalled   Adhesive [tape] Rash   Bactrim [sulfamethoxazole-trimethoprim] Rash   Colchicine Rash   Gabapentin Other (See Comments)   Painful pimples on tongue   Nortriptyline Rash        Medication List     TAKE these medications    acetaminophen 500 MG tablet Commonly known as: TYLENOL Take 1,000 mg by mouth 3 (three) times daily as needed for moderate pain.   allopurinol 100 MG tablet Commonly known as: ZYLOPRIM TAKE 1 TABLET(100 MG) BY MOUTH DAILY What changed:  how much to take how to take this when to take this   amLODipine 10 MG tablet Commonly known as: NORVASC Take 1 tablet (10 mg total) by mouth daily.   amoxicillin 500 MG tablet Commonly known as: AMOXIL TAKE 4 TABLETS (2000 MG) BY MOUTH 1 HOUR PRIOR TO DENTAL APPOINTMENTS AND PROCEDURES   aspirin EC 81 MG tablet Take 81 mg by mouth daily.   AYR NASAL MIST  ALLERGY/SINUS NA Place 2 sprays into the nose as needed (dryness).   azithromycin 250 MG tablet Commonly known as: ZITHROMAX Take 2 tablets (500 mg total) by mouth daily for 1 day, THEN 1 tablet (250 mg total) daily for 4 days. Start taking on: February 22, 2022   Cholecalciferol 25 MCG (1000 UT) capsule Take 1,000 Units by mouth daily.   CITRACAL + D PO Take 2 tablets by mouth in the morning and at bedtime.   cyanocobalamin 1000 MCG/ML injection Commonly known as: VITAMIN B12 Inject 1,000 mcg into the muscle once. Monthly injection   folic acid 1 MG tablet Commonly known as: FOLVITE Take 1 tablet (1 mg total) by mouth daily. Annual appt due in May must see provider for future refills What changed: when to take this   furosemide 40 MG tablet Commonly known as: LASIX TAKE 1 TABLET BY MOUTH EVERY DAY What changed:  when to take this reasons to take this   hydrALAZINE 50 MG tablet Commonly known as: APRESOLINE Take 1 tablet (50 mg total) by mouth 3 (three) times daily.   loratadine 10 MG tablet Commonly known as: CLARITIN Take 10 mg by mouth daily as needed (for seasonal allergies).   nitroGLYCERIN 0.4 MG SL tablet Commonly known as: NITROSTAT DISSOLVE 1  TABLET UNDER THE TONGUE EVERY 5 MINUTES AS NEEDED FOR CHEST PAIN, MAXIMUM 3 TABLETS What changed:  how much to take how to take this when to take this reasons to take this additional instructions   PRESERVISION AREDS 2 PO Take 1 tablet by mouth in the morning and at bedtime.   REFRESH OPTIVE MEGA-3 OP Place 1 drop into both eyes 2 (two) times daily.   VICKS VAPOR INHALER IN Place 1 puff into both nostrils as needed (for congestion).   vitamin C 1000 MG tablet Take 1,000 mg by mouth daily.   Vitamin D (Ergocalciferol) 1.25 MG (50000 UNIT) Caps capsule Commonly known as: DRISDOL Take by mouth.        Follow-up Information     [name], MD Follow up.   Specialty: Internal Medicine Contact  information: [place] [address] [phone]                Discharge Exam: Danley Danker Weights   02/21/22 1702 02/22/22 0500  Weight: 78.9 kg 78.9 kg  BP 135/70 (BP Location: Right Arm)   Pulse 74   Temp 99 F (37.2 C) (Oral)   Resp 18   Ht 5' 10"$  (1.778 m)   Wt 78.9 kg   SpO2 95%   BMI 24.96 kg/m   Well-appearing elderly male in no distress Clear, nonlabored RRR, no MRG, no edema in legs.  LUE diffusely edematous, nontender without induration or erythema. Has IV in this arm that infiltrated.   Condition at discharge: stable  The results of significant diagnostics from this hospitalization (including imaging, microbiology, ancillary and laboratory) are listed below for reference.   Imaging Studies: VAS [place] UPPER EXTREMITY VENOUS DUPLEX  Result Date: 02/22/2022 UPPER VENOUS STUDY  Patient Name:  ISAIAH BRASUELL  Date of Exam:   02/22/2022 Medical Rec #: [id number]           Accession #:    [id number] Date of Birth: Feb 17, 1926           Patient Gender: M Patient Age:   66 years Exam Location:  [facility] Procedure:      VAS [place] UPPER EXTREMITY VENOUS DUPLEX Referring Phys: Vance Gather --------------------------------------------------------------------------------  Indications: Pain, and Swelling Risk Factors: Cancer Lung. Comparison Study: No prior study. Performing Technologist: Candie Chroman  Examination Guidelines: A complete evaluation includes B-mode imaging, spectral Doppler, color Doppler, and power Doppler as needed of all accessible portions of each vessel. Bilateral testing is considered an integral part of a complete examination. Limited examinations for reoccurring indications may be performed as noted.  Right Findings: +----------+------------+---------+-----------+----------+-------+ RIGHT     CompressiblePhasicitySpontaneousPropertiesSummary +----------+------------+---------+-----------+----------+-------+ Subclavian    Full        Yes       Yes                      +----------+------------+---------+-----------+----------+-------+  Left Findings: +----------+------------+---------+-----------+----------+-------+ LEFT      CompressiblePhasicitySpontaneousPropertiesSummary +----------+------------+---------+-----------+----------+-------+ IJV           Full       Yes       Yes                      +----------+------------+---------+-----------+----------+-------+ Subclavian    Full       Yes       Yes                      +----------+------------+---------+-----------+----------+-------+  Axillary      Full       Yes       Yes                      +----------+------------+---------+-----------+----------+-------+ Brachial      Full       Yes       Yes                      +----------+------------+---------+-----------+----------+-------+ Radial        Full                                          +----------+------------+---------+-----------+----------+-------+ Ulnar         Full                                          +----------+------------+---------+-----------+----------+-------+ Cephalic      Full                                          +----------+------------+---------+-----------+----------+-------+ Basilic       None                                   Acute  +----------+------------+---------+-----------+----------+-------+  Summary:  Right: No evidence of thrombosis in the subclavian.  Left: No evidence of deep vein thrombosis in the upper extremity. Findings consistent with acute superficial vein thrombosis involving the left basilic vein.  *See table(s) above for measurements and observations.    Preliminary    ECHOCARDIOGRAM COMPLETE  Result Date: 02/22/2022    ECHOCARDIOGRAM REPORT   Patient Name:   WYNDHAM RAJCHEL Date of Exam: 02/22/2022 Medical Rec #:  [id number]          Height:       70.0 in Accession #:    [id number]         Weight:       173.9 lb Date  of Birth:  1926-04-01          BSA:          1.967 m Patient Age:    89 years           BP:           127/61 mmHg Patient Gender: M                  HR:           69 bpm. Exam Location:  Inpatient Procedure: 2D Echo, 3D Echo, Cardiac Doppler and Color Doppler Indications:    Fever  History:        Patient has prior history of Echocardiogram examinations, most                 recent 02/07/2022. CHF, Abnormal ECG, Aortic Valve Disease,                 Arrythmias:Bradycardia and Ventricular Fibrillation; Risk                 Factors:Hypertension and Dyslipidemia.  Aortic Valve: 26 mm Sapien prosthetic, stented (TAVR) valve is                 present in the aortic position. Procedure Date: 02/06/2022.  Sonographer:    Roseanna Rainbow RDCS Referring Phys: [id number] [place]  1. Poor acoustic windows limit study Difficlt to see endocardium in all views.. Left ventricular ejection fraction, by estimation, is 50 to 55%. The left ventricle has low normal function. The left ventricle has no regional wall motion abnormalities. There is severe asymmetric left ventricular hypertrophy.  2. Right ventricular systolic function is normal. The right ventricular size is normal. There is moderately elevated pulmonary artery systolic pressure.  3. Left atrial size was moderately dilated.  4. Right atrial size was moderately dilated.  5. Mild mitral valve regurgitation. Moderate mitral annular calcification.  6. S/p TAVR (26 mm Sapien prosthesis, procedure date 02/06/22). Peak and mean gradients through the vavle are 15 and 8 mm Hg respectively. NO signficant change from echo in 1.30. 2024.. The aortic valve has been repaired/replaced. Aortic valve regurgitation is not visualized. There is a 26 mm Sapien prosthetic (TAVR) valve present in the aortic position. Procedure Date: 02/06/2022.  7. Aortic dilatation noted. There is mild dilatation of the ascending aorta, measuring 39 mm.  8. The inferior vena cava is dilated  in size with <50% respiratory variability, suggesting right atrial pressure of 15 mmHg. FINDINGS  Left Ventricle: Poor acoustic windows limit study Difficlt to see endocardium in all views. Left ventricular ejection fraction, by estimation, is 50 to 55%. The left ventricle has low normal function. The left ventricle has no regional wall motion abnormalities. The left ventricular internal cavity size was normal in size. There is severe asymmetric left ventricular hypertrophy. Right Ventricle: The right ventricular size is normal. Right vetricular wall thickness was not assessed. Right ventricular systolic function is normal. There is moderately elevated pulmonary artery systolic pressure. The tricuspid regurgitant velocity is  2.91 m/s, and with an assumed right atrial pressure of 15 mmHg, the estimated right ventricular systolic pressure is [id number] mmHg. Left Atrium: Left atrial size was moderately dilated. Right Atrium: Right atrial size was moderately dilated. Pericardium: There is no evidence of pericardial effusion. Mitral Valve: There is mild thickening of the mitral valve leaflet(s). Moderate mitral annular calcification. Mild mitral valve regurgitation. Tricuspid Valve: The tricuspid valve is normal in structure. Tricuspid valve regurgitation is mild. Aortic Valve: S/p TAVR (26 mm Sapien prosthesis, procedure date 02/06/22). Peak and mean gradients through the vavle are 15 and 8 mm Hg respectively. NO signficant change from echo in 1.30. 2024. The aortic valve has been repaired/replaced. Aortic valve regurgitation is not visualized. Aortic valve mean gradient measures 7.5 mmHg. Aortic valve peak gradient measures 14.9 mmHg. Aortic valve area, by VTI measures 3.00 cm. There is a 26 mm Sapien prosthetic, stented (TAVR) valve present in the aortic position. Procedure Date: 02/06/2022. Pulmonic Valve: The pulmonic valve was normal in structure. Pulmonic valve regurgitation is not visualized. Aorta: The aortic root is  normal in size and structure and aortic dilatation noted. There is mild dilatation of the ascending aorta, measuring 39 mm. Venous: The inferior vena cava is dilated in size with less than 50% respiratory variability, suggesting right atrial pressure of 15 mmHg. IAS/Shunts: No atrial level shunt detected by color flow Doppler.  LEFT VENTRICLE PLAX 2D LVIDd:         4.65 cm      Diastology LVIDs:  3.80 cm      LV e' medial:   9.03 cm/s LV PW:         1.38 cm      LV E/e' medial: 14.6 LV IVS:        1.48 cm LVOT diam:     2.60 cm LV SV:         113 LV SV Index:   57 LVOT Area:     5.31 cm     3D Volume EF:                             3D EF:        42 %                             LV EDV:       189 ml LV Volumes (MOD)            LV ESV:       109 ml LV vol d, MOD A2C: 121.0 ml LV SV:        80 ml LV vol d, MOD A4C: 94.4 ml LV vol s, MOD A2C: 57.2 ml LV vol s, MOD A4C: 43.1 ml LV SV MOD A2C:     63.8 ml LV SV MOD A4C:     94.4 ml LV SV MOD BP:      55.6 ml RIGHT VENTRICLE            IVC RV S prime:     9.14 cm/s  IVC diam: 2.50 cm TAPSE (M-mode): 1.2 cm LEFT ATRIUM             Index        RIGHT ATRIUM           Index LA diam:        5.10 cm 2.59 cm/m   RA Area:     26.40 cm LA Vol (A2C):   81.8 ml 41.58 ml/m  RA Volume:   90.10 ml  45.80 ml/m LA Vol (A4C):   82.9 ml 42.14 ml/m LA Biplane Vol: 85.3 ml 43.36 ml/m  AORTIC VALVE AV Area (Vmax):    3.05 cm AV Area (Vmean):   3.13 cm AV Area (VTI):     3.00 cm AV Vmax:           193.00 cm/s AV Vmean:          125.500 cm/s AV VTI:            0.375 m AV Peak Grad:      14.9 mmHg AV Mean Grad:      7.5 mmHg LVOT Vmax:         111.00 cm/s LVOT Vmean:        73.900 cm/s LVOT VTI:          0.212 m LVOT/AV VTI ratio: 0.57  AORTA Ao Root diam: 3.30 cm Ao Asc diam:  3.90 cm MITRAL VALVE                TRICUSPID VALVE MV Area (PHT): 4.26 cm     TR Peak grad:   33.9 mmHg MV Decel Time: 178 msec     TR Vmax:        291.00 cm/s MV E velocity: 131.50 cm/s MV A velocity:  107.00 cm/s  SHUNTS MV E/A ratio:  1.23  Systemic VTI:  0.21 m                             Systemic Diam: 2.60 cm Dorris Carnes MD Electronically signed by Dorris Carnes MD Signature Date/Time: 02/22/2022/3:18:48 PM    Final    DG Chest Port 1 View  Result Date: 02/21/2022 CLINICAL DATA:  Possible sepsis lethargy fever EXAM: PORTABLE CHEST 1 VIEW COMPARISON:  02/13/2022 FINDINGS: Left-sided dual lead pacing device as before. Valve prosthesis. Status post sternotomy. No focal airspace disease. Probable small pleural effusions. Stable cardiomediastinal silhouette with aortic atherosclerosis. IMPRESSION: Probable small pleural effusions. No focal airspace disease. Electronically Signed   By: Donavan Foil M.D.   On: 02/21/2022 17:23   DG Chest 2 View  Result Date: 02/13/2022 CLINICAL DATA:  Tremors.  Status post valve replacement. EXAM: CHEST - 2 VIEW COMPARISON:  Chest radiograph dated 02/02/2022. FINDINGS: There is blunting of the posterior costophrenic angles on the lateral view consistent with small pleural effusions. There is associated atelectasis of the lung bases. No pneumothorax. Mild cardiomegaly. Median sternotomy wires, CABG vascular clips, and aortic valve with placement. Left pectoral pacemaker device. No acute osseous pathology. Degenerative changes of the spine. IMPRESSION: Small bilateral pleural effusions with associated atelectasis. Electronically Signed   By: Anner Crete M.D.   On: 02/13/2022 17:44   ECHOCARDIOGRAM COMPLETE  Result Date: 02/08/2022    ECHOCARDIOGRAM REPORT   Patient Name:   SAMEE CASPAR Date of Exam: 02/07/2022 Medical Rec #:  [id number]          Height:       70.0 in Accession #:    [id number]         Weight:       157.6 lb Date of Birth:  1926-06-27          BSA:          1.886 m Patient Age:    81 years           BP:           158/71 mmHg Patient Gender: M                  HR:           70 bpm. Exam Location:  Inpatient Procedure: 2D Echo, Cardiac Doppler and  Color Doppler Indications:    Post TAVR evaluation V43.3 / Z95.2  History:        Patient has prior history of Echocardiogram examinations, most                 recent 02/06/2022. CHF, CAD, Pacemaker, Aortic Valve Disease,                 Arrythmias:Atrial Fibrillation and Bradycardia; Risk                 Factors:GERD, Dyslipidemia and Hypertension.                 Aortic Valve: 26 mm Sapien prosthetic, stented (TAVR) valve is                 present in the aortic position. Procedure Date: 02/06/2022.  Sonographer:    Bernadene Person RDCS Referring Phys: [id number] [place]  1. The aortic valve has been repaired/replaced. Aortic valve regurgitation is not visualized. There is a 26 mm Sapien prosthetic (TAVR) valve present in the aortic position. Procedure Date: 02/06/2022. Echo  findings are consistent with normal structure and function of the aortic valve prosthesis. Aortic valve area, by VTI measures 2.58 cm. Aortic valve mean gradient measures 7.3 mmHg. Aortic valve Vmax measures 1.84 m/s.  2. Left ventricular ejection fraction, by estimation, is 55 to 60%. The left ventricle has normal function. The left ventricle has no regional wall motion abnormalities. There is mild concentric left ventricular hypertrophy. Left ventricular diastolic parameters are consistent with Grade II diastolic dysfunction (pseudonormalization).  3. Right ventricular systolic function is normal. The right ventricular size is normal. There is mildly elevated pulmonary artery systolic pressure. The estimated right ventricular systolic pressure is [id number] mmHg.  4. Left atrial size was severely dilated.  5. The mitral valve is grossly normal. Mild to moderate mitral valve regurgitation. No evidence of mitral stenosis.  6. The inferior vena cava is dilated in size with >50% respiratory variability, suggesting right atrial pressure of 8 mmHg. FINDINGS  Left Ventricle: Left ventricular ejection fraction, by estimation, is 55 to  60%. The left ventricle has normal function. The left ventricle has no regional wall motion abnormalities. The left ventricular internal cavity size was normal in size. There is  mild concentric left ventricular hypertrophy. Left ventricular diastolic parameters are consistent with Grade II diastolic dysfunction (pseudonormalization). Right Ventricle: The right ventricular size is normal. No increase in right ventricular wall thickness. Right ventricular systolic function is normal. There is mildly elevated pulmonary artery systolic pressure. The tricuspid regurgitant velocity is 2.88  m/s, and with an assumed right atrial pressure of 8 mmHg, the estimated right ventricular systolic pressure is [id number] mmHg. Left Atrium: Left atrial size was severely dilated. Right Atrium: Right atrial size was normal in size. Pericardium: There is no evidence of pericardial effusion. Mitral Valve: The mitral valve is grossly normal. Mild mitral annular calcification. Mild to moderate mitral valve regurgitation. No evidence of mitral valve stenosis. Tricuspid Valve: The tricuspid valve is grossly normal. Tricuspid valve regurgitation is mild . No evidence of tricuspid stenosis. Aortic Valve: The aortic valve has been repaired/replaced. Aortic valve regurgitation is not visualized. Aortic valve mean gradient measures 7.3 mmHg. Aortic valve peak gradient measures 13.5 mmHg. Aortic valve area, by VTI measures 2.58 cm. There is a 26 mm Sapien prosthetic, stented (TAVR) valve present in the aortic position. Procedure Date: 02/06/2022. Echo findings are consistent with normal structure and function of the aortic valve prosthesis. Pulmonic Valve: The pulmonic valve was grossly normal. Pulmonic valve regurgitation is trivial. No evidence of pulmonic stenosis. Aorta: The aortic root and ascending aorta are structurally normal, with no evidence of dilitation. Venous: The inferior vena cava is dilated in size with greater than 50% respiratory  variability, suggesting right atrial pressure of 8 mmHg. IAS/Shunts: The atrial septum is grossly normal. Additional Comments: A device lead is visualized in the right atrium and right ventricle.  LEFT VENTRICLE PLAX 2D LVIDd:         4.70 cm      Diastology LVIDs:         3.40 cm      LV e' medial:    6.45 cm/s LV PW:         1.10 cm      LV E/e' medial:  15.4 LV IVS:        1.10 cm      LV e' lateral:   9.29 cm/s LVOT diam:     2.26 cm      LV E/e' lateral: 10.7 LV SV:  98 LV SV Index:   52 LVOT Area:     4.01 cm  LV Volumes (MOD) LV vol d, MOD A2C: 149.0 ml LV vol d, MOD A4C: 156.0 ml LV vol s, MOD A2C: 65.8 ml LV vol s, MOD A4C: 57.7 ml LV SV MOD A2C:     83.2 ml LV SV MOD A4C:     156.0 ml LV SV MOD BP:      92.4 ml RIGHT VENTRICLE RV S prime:     8.32 cm/s TAPSE (M-mode): 1.6 cm LEFT ATRIUM             Index        RIGHT ATRIUM           Index LA diam:        4.90 cm 2.60 cm/m   RA Area:     19.00 cm LA Vol (A2C):   96.5 ml 51.15 ml/m  RA Volume:   48.30 ml  25.60 ml/m LA Vol (A4C):   91.7 ml 48.61 ml/m LA Biplane Vol: 99.9 ml 52.96 ml/m  AORTIC VALVE AV Area (Vmax):    2.65 cm AV Area (Vmean):   2.59 cm AV Area (VTI):     2.58 cm AV Vmax:           184.00 cm/s AV Vmean:          124.667 cm/s AV VTI:            0.379 m AV Peak Grad:      13.5 mmHg AV Mean Grad:      7.3 mmHg LVOT Vmax:         121.50 cm/s LVOT Vmean:        80.450 cm/s LVOT VTI:          0.244 m LVOT/AV VTI ratio: 0.64  AORTA Ao Root diam: 3.60 cm Ao Asc diam:  3.60 cm MITRAL VALVE                  TRICUSPID VALVE MV Area (PHT): 3.53 cm       TR Peak grad:   33.2 mmHg MV Decel Time: 215 msec       TR Vmax:        288.00 cm/s MR Peak grad:    133.6 mmHg MR Mean grad:    80.0 mmHg    SHUNTS MR Vmax:         578.00 cm/s  Systemic VTI:  0.24 m MR Vmean:        423.0 cm/s   Systemic Diam: 2.26 cm MR PISA:         1.01 cm MR PISA Eff ROA: 7 mm MR PISA Radius:  0.40 cm MV E velocity: 99.20 cm/s MV A velocity: 111.00 cm/s MV E/A  ratio:  0.89 Eleonore Chiquito MD Electronically signed by Eleonore Chiquito MD Signature Date/Time: 02/08/2022/10:27:22 AM    Final    ECHOCARDIOGRAM LIMITED  Result Date: 02/06/2022    ECHOCARDIOGRAM LIMITED REPORT   Patient Name:   JESHUA GALLIER Date of Exam: 02/06/2022 Medical Rec #:  [id number]          Height:       70.0 in Accession #:    [id number]         Weight:       154.0 lb Date of Birth:  [birth year]          BSA:          1.868  m Patient Age:    51 years           BP:           167/91 mmHg Patient Gender: M                  HR:           68 bpm. Exam Location:  Inpatient Procedure: Limited Echo, Limited Color Doppler and Cardiac Doppler Indications:     Aortic Stenosis  History:         Patient has prior history of Echocardiogram examinations, most                  recent 12/18/2021. CHF, Previous Myocardial Infarction and CAD,                  Pacemaker; Risk Factors:GERD, Hypertension and Dyslipidemia.                  Aortic Valve: 26 mm Sapien prosthetic, stented (TAVR) valve is                  present in the aortic position. Procedure Date: 02/06/2022.  Sonographer:     Bernadene Person RDCS Referring Phys:  [place] Diagnosing Phys: Eleonore Chiquito MD  Sonographer Comments: 94m Edwards Sapien aortic valve placed. IMPRESSIONS  1. 26 mm S3 TAVR. Vmax 1 m/s, MG 2.0 mmHG, EOA 3.64 cm2, DI 0.82. No regurgitation or paravalvular leak. The aortic valve has been repaired/replaced. Aortic valve regurgitation is not visualized. No aortic stenosis is present. There is a 26 mm Sapien prosthetic (TAVR) valve present in the aortic position. Procedure Date: 02/06/2022.  2. Left ventricular ejection fraction, by estimation, is 55 to 60%. The left ventricle has normal function. The left ventricle has no regional wall motion abnormalities.  3. Right ventricular systolic function is normal. The right ventricular size is normal.  4. The mitral valve is degenerative. Mild mitral valve regurgitation. No evidence  of mitral stenosis.  5. Tricuspid valve regurgitation is mild to moderate. FINDINGS  Left Ventricle: Left ventricular ejection fraction, by estimation, is 55 to 60%. The left ventricle has normal function. The left ventricle has no regional wall motion abnormalities. Right Ventricle: The right ventricular size is normal. No increase in right ventricular wall thickness. Right ventricular systolic function is normal. Pericardium: There is no evidence of pericardial effusion. Mitral Valve: The mitral valve is degenerative in appearance. Mild mitral valve regurgitation. No evidence of mitral valve stenosis. Tricuspid Valve: The tricuspid valve is grossly normal. Tricuspid valve regurgitation is mild to moderate. No evidence of tricuspid stenosis. Aortic Valve: 26 mm S3 TAVR. Vmax 1 m/s, MG 2.0 mmHG, EOA 3.64 cm2, DI 0.82. No regurgitation or paravalvular leak. The aortic valve has been repaired/replaced. Aortic valve regurgitation is not visualized. Aortic regurgitation PHT measures 433 msec. No aortic stenosis is present. Aortic valve mean gradient measures 2.0 mmHg. Aortic valve peak gradient measures 3.9 mmHg. Aortic valve area, by VTI measures 3.64 cm. There is a 26 mm Sapien prosthetic, stented (TAVR) valve present in the aortic position. Procedure Date: 02/06/2022. Pulmonic Valve: The pulmonic valve was grossly normal. Pulmonic valve regurgitation is trivial. No evidence of pulmonic stenosis. Additional Comments: Spectral Doppler performed. Color Doppler performed.  LEFT VENTRICLE PLAX 2D LVOT diam:     2.38 cm LV SV:         89 LV SV Index:   47 LVOT Area:     4.45  cm  AORTIC VALVE AV Area (Vmax):    4.53 cm AV Area (Vmean):   1.63 cm AV Area (VTI):     3.64 cm AV Vmax:           98.20 cm/s AV Vmean:          169.150 cm/s AV VTI:            0.243 m AV Peak Grad:      3.9 mmHg AV Mean Grad:      2.0 mmHg LVOT Vmax:         100.00 cm/s LVOT Vmean:        61.800 cm/s LVOT VTI:          0.199 m LVOT/AV VTI ratio:  0.82 AI PHT:            433 msec TRICUSPID VALVE TR Peak grad:   48.2 mmHg TR Vmax:        347.00 cm/s  SHUNTS Systemic VTI:  0.20 m Systemic Diam: 2.38 cm Eleonore Chiquito MD Electronically signed by Eleonore Chiquito MD Signature Date/Time: 02/06/2022/12:40:49 PM    Final    Structural Heart Procedure  Result Date: 02/06/2022 See surgical note for result.  DG Chest 2 View  Result Date: 02/04/2022 CLINICAL DATA:  Preop for aortic valve replacement. EXAM: CHEST - 2 VIEW COMPARISON:  May 23, 2020. FINDINGS: The heart size and mediastinal contours are within normal limits. Status post coronary bypass graft. Left-sided pacemaker is unchanged in position. Small bilateral pleural effusions are noted. No definite consolidative process is noted. The visualized skeletal structures are unremarkable. IMPRESSION: Small bilateral pleural effusions. No definite consolidative process is noted. Electronically Signed   By: Marijo Conception M.D.   On: 02/04/2022 20:33    Microbiology: Results for orders placed or performed during the hospital encounter of 02/21/22  Resp panel by RT-PCR (RSV, Flu A&B, Covid) Anterior Nasal Swab     Status: None   Collection Time: 02/21/22  5:05 PM   Specimen: Anterior Nasal Swab  Result Value Ref Range Status   SARS Coronavirus 2 by RT PCR NEGATIVE NEGATIVE Final   Influenza A by PCR NEGATIVE NEGATIVE Final   Influenza B by PCR NEGATIVE NEGATIVE Final    Comment: (NOTE) The Xpert Xpress SARS-CoV-2/FLU/RSV plus assay is intended as an aid in the diagnosis of influenza from Nasopharyngeal swab specimens and should not be used as a sole basis for treatment. Nasal washings and aspirates are unacceptable for Xpert Xpress SARS-CoV-2/FLU/RSV testing.  Fact Sheet for Patients: EntrepreneurPulse.com.au  Fact Sheet for Healthcare Providers: IncredibleEmployment.be  This test is not yet approved or cleared by the [place] FDA and has been  authorized for detection and/or diagnosis of SARS-CoV-2 by FDA under an Emergency Use Authorization (EUA). This EUA will remain in effect (meaning this test can be used) for the duration of the COVID-19 declaration under Section 564(b)(1) of the Act, 21 U.S.C. section 360bbb-3(b)(1), unless the authorization is terminated or revoked.     Resp Syncytial Virus by PCR NEGATIVE NEGATIVE Final    Comment: (NOTE) Fact Sheet for Patients: EntrepreneurPulse.com.au  Fact Sheet for Healthcare Providers: IncredibleEmployment.be  This test is not yet approved or cleared by the [place] FDA and has been authorized for detection and/or diagnosis of SARS-CoV-2 by FDA under an Emergency Use Authorization (EUA). This EUA will remain in effect (meaning this test can be used) for the duration of the COVID-19 declaration under Section 564(b)(1) of the Act,  21 U.S.C. section 360bbb-3(b)(1), unless the authorization is terminated or revoked.  Performed at [facility], [place] [address], [place], [place] [address]   Blood Culture (routine x 2)     Status: None (Preliminary result)   Collection Time: 02/21/22  5:10 PM   Specimen: BLOOD LEFT ARM  Result Value Ref Range Status   Specimen Description BLOOD LEFT ARM  Final   Special Requests   Final    BOTTLES DRAWN AEROBIC AND ANAEROBIC Blood Culture results may not be optimal due to an inadequate volume of blood received in culture bottles   Culture   Final    NO GROWTH < 24 HOURS Performed at [facility], [place] [address] [place], [place] [address]    Report Status PENDING  Incomplete  Blood Culture (routine x 2)     Status: None (Preliminary result)   Collection Time: 02/21/22  5:20 PM   Specimen: BLOOD RIGHT ARM  Result Value Ref Range Status   Specimen Description BLOOD RIGHT ARM  Final   Special Requests   Final    BOTTLES DRAWN AEROBIC AND ANAEROBIC Blood Culture results may not be optimal  due to an excessive volume of blood received in culture bottles   Culture   Final    NO GROWTH < 24 HOURS Performed at [facility], [place] [address], [place], [place] [address]    Report Status PENDING  Incomplete  Respiratory (~20 pathogens) panel by PCR     Status: None   Collection Time: 02/21/22  8:09 PM   Specimen: Nasopharyngeal Swab; Respiratory  Result Value Ref Range Status   Adenovirus NOT DETECTED NOT DETECTED Final   Coronavirus 229E NOT DETECTED NOT DETECTED Final    Comment: (NOTE) The Coronavirus on the Respiratory Panel, DOES NOT test for the novel  Coronavirus (2019 nCoV)    Coronavirus HKU1 NOT DETECTED NOT DETECTED Final   Coronavirus NL63 NOT DETECTED NOT DETECTED Final   Coronavirus OC43 NOT DETECTED NOT DETECTED Final   Metapneumovirus NOT DETECTED NOT DETECTED Final   Rhinovirus / Enterovirus NOT DETECTED NOT DETECTED Final   Influenza A NOT DETECTED NOT DETECTED Final   Influenza B NOT DETECTED NOT DETECTED Final   Parainfluenza Virus 1 NOT DETECTED NOT DETECTED Final   Parainfluenza Virus 2 NOT DETECTED NOT DETECTED Final   Parainfluenza Virus 3 NOT DETECTED NOT DETECTED Final   Parainfluenza Virus 4 NOT DETECTED NOT DETECTED Final   Respiratory Syncytial Virus NOT DETECTED NOT DETECTED Final   Bordetella pertussis NOT DETECTED NOT DETECTED Final   Bordetella Parapertussis NOT DETECTED NOT DETECTED Final   Chlamydophila pneumoniae NOT DETECTED NOT DETECTED Final   Mycoplasma pneumoniae NOT DETECTED NOT DETECTED Final    Comment: Performed at [facility], [place]. [address] [place], [place] [address]   *Note: Due to a large number of results and/or encounters for the requested time period, some results have not been displayed. A complete set of results can be found in Results Review.    Labs: CBC: Recent Labs  Lab 02/21/22 1707 02/22/22 0322  WBC 4.4 3.3*  NEUTROABS 4.0  --   HGB 9.4* 8.9*  HCT 29.4* 26.9*  MCV 80.1 78.0*  PLT 119* 103*    Basic Metabolic Panel: Recent Labs  Lab 02/21/22 1707 02/22/22 0322  NA 136 136  K 3.8 3.6  CL 106 107  CO2 19* 17*  GLUCOSE 137* 98  BUN 39* 38*  CREATININE  2.57* 2.34*  CALCIUM 9.4 8.4*   Liver Function Tests: Recent Labs  Lab 02/21/22 1707  AST 43*  ALT 21  ALKPHOS 58  BILITOT 0.7  PROT 6.8  ALBUMIN 3.8   CBG: No results for input(s): "GLUCAP" in the last 168 hours.  Discharge time spent: greater than 30 minutes.  Signed: Patrecia Pour, MD [facility] 02/22/2022

## 2022-02-22 NOTE — Progress Notes (Signed)
AVS given and reviewed with pt and wife at bedside. Medications discussed. All questions answered to satisfaction. Pt and wife verbalized understanding of information given. Pt escorted off the unit with all belongings via wheelchair by staff member.

## 2022-02-22 NOTE — Care Management (Signed)
  Transition of Care [facility]) Screening Note   Patient Details  Name: [name] Date of Birth: 01/06/1927   [facility] CM/SW Contact:    Bethena Roys, RN Phone Number: 02/22/2022, 4:10 PM    Transition of Care Department [facility]) has reviewed the patient. Patient presented for fever. PTA patient was from home with spouse. Spouse discussed with Staff RN that the patient may benefit from [facility] RN in the home. Case Manager visited the patient and spouse and discussed the Medicare.gov list. Spouse feels like she needs some time to think over the services. No HH arranged at this time. Case Manager will continue to monitor patient advancement through interdisciplinary progression rounds. If new patient transition needs arise, please place a TOC consult.

## 2022-02-26 ENCOUNTER — Encounter: Payer: Self-pay | Admitting: Internal Medicine

## 2022-02-26 LAB — CULTURE, BLOOD (ROUTINE X 2)
Culture: NO GROWTH
Culture: NO GROWTH

## 2022-02-26 NOTE — Progress Notes (Unsigned)
Subjective:    Patient ID: [name], male    DOB: 01/04/1927, 89 y.o.   MRN: [id number]     HPI Elijahjuan is here for follow up from the hospital.   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  Increased amlodipine  02/13/22 - ED for tremor, leg cramps-workup negative and discharged home.     02/21/22 AKI, fever of unknown cause - admitted 2/14-2/15 Work up including blood cx negative.  Started on empiric antibiotics, IVF given which improved his AKI.  Fevers resolved.  ID w/u unrevealing.    Rsv, covid, flu tests neg.  Ua, ucx neg.  No signs of DVT/PE by [place].  S/p TAVR stable by echo w/o evidence of vegetation/abscess.   HFpEF - initially hypovolemic - improved with IVF Acute left basilic superficial vein thrombosis - warm, compress,elevation   Completed 7 days of abx  He felt better when he left the hosp but is now feeling worse.   This morning legs and neck bothering him.  He took tylenol and Aspercreme.   Getting pain from knees to hips.    Feet swelling more.    Having hallucination --he is seeing one person and with a blink of an eye the person multiplies and he is seeing multiple - up to 14 people.  He looks at the floor and things change - there are lines on the floor and there are no lines on the floor.  Started after the TAVR.   Talking in his sleep a lot.   Cough is mostly at night.  Loose wet cough.     Not drinking as much fluids.  Not eating much.   Medications and allergies reviewed with patient and updated if appropriate.  Current Outpatient Medications on File Prior to Visit  Medication Sig Dispense Refill   acetaminophen (TYLENOL) 500 MG tablet Take 1,000 mg by mouth 3 (three) times daily as needed for moderate pain.     allopurinol (ZYLOPRIM) 100 MG tablet TAKE 1 TABLET(100 MG) BY MOUTH DAILY (Patient taking differently: Take 100 mg by mouth daily. TAKE 1 TABLET(100 MG) BY MOUTH DAILY) 30 tablet 0   amLODipine (NORVASC) 10 MG tablet Take 1 tablet (10 mg total) by mouth  daily. 30 tablet 6   amoxicillin (AMOXIL) 500 MG tablet TAKE 4 TABLETS (2000 MG) BY MOUTH 1 HOUR PRIOR TO DENTAL APPOINTMENTS AND PROCEDURES 12 tablet 6   Aromatic Inhalants (VICKS VAPOR INHALER IN) Place 1 puff into both nostrils as needed (for congestion).     Ascorbic Acid (VITAMIN C) 1000 MG tablet Take 1,000 mg by mouth daily.     aspirin EC 81 MG tablet Take 81 mg by mouth daily.     azithromycin (ZITHROMAX) 250 MG tablet Take 2 tablets (500 mg total) by mouth daily for 1 day, THEN 1 tablet (250 mg total) daily for 4 days. 6 tablet 0   Calcium Citrate-Vitamin D (CITRACAL + D PO) Take 2 tablets by mouth in the morning and at bedtime.     Carboxymeth-Glycerin-Polysorb (REFRESH OPTIVE MEGA-3 OP) Place 1 drop into both eyes 2 (two) times daily.     Cholecalciferol 25 MCG (1000 UT) capsule Take 1,000 Units by mouth daily.     cyanocobalamin (,VITAMIN B-12,) 1000 MCG/ML injection Inject 1,000 mcg into the muscle once. Monthly injection     folic acid (FOLVITE) 1 MG tablet Take 1 tablet (1 mg total) by mouth daily. Annual appt due in May must see provider for future refills (  Patient taking differently: Take 1 mg by mouth at bedtime. Annual appt due in May must see provider for future refills) 90 tablet 2   furosemide (LASIX) 40 MG tablet TAKE 1 TABLET BY MOUTH EVERY DAY (Patient taking differently: Take 40 mg by mouth as needed for fluid or edema.) 90 tablet 0   hydrALAZINE (APRESOLINE) 50 MG tablet Take 1 tablet (50 mg total) by mouth 3 (three) times daily. 90 tablet 6   loratadine (CLARITIN) 10 MG tablet Take 10 mg by mouth daily as needed (for seasonal allergies).     Multiple Vitamins-Minerals (PRESERVISION AREDS 2 PO) Take 1 tablet by mouth in the morning and at bedtime.     nitroGLYCERIN (NITROSTAT) 0.4 MG SL tablet DISSOLVE 1 TABLET UNDER THE TONGUE EVERY 5 MINUTES AS NEEDED FOR CHEST PAIN, MAXIMUM 3 TABLETS (Patient taking differently: Place 0.4 mg under the tongue every 5 (five) minutes as  needed for chest pain.) 100 tablet 1   Saline (AYR NASAL MIST ALLERGY/SINUS NA) Place 2 sprays into the nose as needed (dryness).     Vitamin D, Ergocalciferol, (DRISDOL) 1.25 MG (50000 UNIT) CAPS capsule Take by mouth.     Current Facility-Administered Medications on File Prior to Visit  Medication Dose Route Frequency Provider Last Rate Last Admin   NON FORMULARY 1 application  1 application  Topical PRN Landis Martins, DPM         Review of Systems  Constitutional:  Positive for appetite change (decreased). Negative for fever.  HENT:  Negative for congestion, ear pain, sinus pressure and sore throat.   Respiratory:  Positive for cough, shortness of breath and wheezing.   Cardiovascular:  Positive for leg swelling. Negative for chest pain and palpitations.  Gastrointestinal:  Positive for abdominal pain, constipation and diarrhea. Negative for blood in stool (no black stool) and nausea.       No gerd  Musculoskeletal:  Positive for neck pain (acute on chronic).       Leg pain  Neurological:  Positive for dizziness, light-headedness and headaches.  Psychiatric/Behavioral:  Positive for confusion (mild) and hallucinations.        Objective:   Vitals:   02/27/22 1309  BP: (!) 110/58  Pulse: 80  Temp: 98 F (36.7 C)  SpO2: 95%   BP Readings from Last 3 Encounters:  02/27/22 (!) 110/58  02/22/22 135/70  02/14/22 (!) 116/52   Wt Readings from Last 3 Encounters:  02/27/22 164 lb (74.4 kg)  02/22/22 173 lb 15.1 oz (78.9 kg)  02/14/22 154 lb (69.9 kg)   Body mass index is 23.53 kg/m.    Physical Exam Constitutional:      General: He is not in acute distress.    Appearance: He is ill-appearing. He is not diaphoretic.     Comments: Frail elderly male, pale, ? Mild jaundice  HENT:     Head: Normocephalic and atraumatic.  Eyes:     Conjunctiva/sclera: Conjunctivae normal.  Cardiovascular:     Rate and Rhythm: Normal rate and regular rhythm.  Pulmonary:     Effort:  Pulmonary effort is normal. No respiratory distress.     Breath sounds: Normal breath sounds. No wheezing or rales.  Abdominal:     General: There is no distension.     Palpations: Abdomen is soft.     Tenderness: There is no abdominal tenderness. There is no guarding or rebound.  Musculoskeletal:     Right lower leg: Edema (1+ pitting) present.  Left lower leg: Edema (1+ pitting) present.  Skin:    General: Skin is warm.     Coloration: Skin is jaundiced (? mildly jaundiced).  Neurological:     Mental Status: He is alert.        Lab Results  Component Value Date   WBC 3.3 (L) 02/22/2022   HGB 8.9 (L) 02/22/2022   HCT 26.9 (L) 02/22/2022   PLT 103 (L) 02/22/2022   GLUCOSE 98 02/22/2022   CHOL 122 09/04/2016   TRIG 59.0 09/04/2016   HDL 40.40 09/04/2016   LDLCALC 70 09/04/2016   ALT 21 02/21/2022   AST 43 (H) 02/21/2022   NA 136 02/22/2022   K 3.6 02/22/2022   CL 107 02/22/2022   CREATININE 2.34 (H) 02/22/2022   BUN 38 (H) 02/22/2022   CO2 17 (L) 02/22/2022   TSH 1.827 02/02/2018   PSA 0.44 07/18/2007   INR 1.2 02/21/2022    Lab Results  Component Value Date   WBC 9.1 02/27/2022   HGB 9.2 (L) 02/27/2022   HCT 28.0 (L) 02/27/2022   PLT 253.0 02/27/2022   GLUCOSE 93 02/27/2022   CHOL 122 09/04/2016   TRIG 59.0 09/04/2016   HDL 40.40 09/04/2016   LDLCALC 70 09/04/2016   ALT 186 (H) 02/27/2022   AST 174 (H) 02/27/2022   NA 133 (L) 02/27/2022   K 3.8 02/27/2022   CL 104 02/27/2022   CREATININE 3.03 (H) 02/27/2022   BUN 61 (H) 02/27/2022   CO2 18 (L) 02/27/2022   TSH 1.827 02/02/2018   PSA 0.44 07/18/2007   INR 1.2 02/21/2022     VAS [place] UPPER EXTREMITY VENOUS DUPLEX UPPER VENOUS STUDY    Patient Name:  CALEY FARNER  Date of Exam:   02/22/2022 Medical Rec #: [id number]           Accession #:    [id number] Date of Birth: [birth year]           Patient Gender: M Patient Age:   65 years Exam Location:  [facility] Procedure:      VAS [place]  UPPER EXTREMITY VENOUS DUPLEX Referring Phys: Vance Gather  --------------------------------------------------------------------------------   Indications: Pain, and Swelling Risk Factors: Cancer Lung. Comparison Study: No prior study.  Performing Technologist: Candie Chroman    Examination Guidelines: A complete evaluation includes B-mode imaging, spectral Doppler, color Doppler, and power Doppler as needed of all accessible portions of each vessel. Bilateral testing is considered an integral part of a complete examination. Limited examinations for reoccurring indications may be performed as noted.    Right Findings: +----------+------------+---------+-----------+----------+-------+ RIGHT     CompressiblePhasicitySpontaneousPropertiesSummary +----------+------------+---------+-----------+----------+-------+ Subclavian    Full       Yes       Yes                      +----------+------------+---------+-----------+----------+-------+    Left Findings: +----------+------------+---------+-----------+----------+-------+ LEFT      CompressiblePhasicitySpontaneousPropertiesSummary +----------+------------+---------+-----------+----------+-------+ IJV           Full       Yes       Yes                      +----------+------------+---------+-----------+----------+-------+ Subclavian    Full       Yes       Yes                      +----------+------------+---------+-----------+----------+-------+ Axillary  Full       Yes       Yes                      +----------+------------+---------+-----------+----------+-------+ Brachial      Full       Yes       Yes                      +----------+------------+---------+-----------+----------+-------+ Radial        Full                                          +----------+------------+---------+-----------+----------+-------+ Ulnar         Full                                           +----------+------------+---------+-----------+----------+-------+ Cephalic      Full                                          +----------+------------+---------+-----------+----------+-------+ Basilic       None                                   Acute  +----------+------------+---------+-----------+----------+-------+    Summary:   Right: No evidence of thrombosis in the subclavian.   Left: No evidence of deep vein thrombosis in the upper extremity. Findings consistent with acute superficial vein thrombosis involving the left basilic vein.    *See table(s) above for measurements and observations.   Diagnosing physician: Deitra Mayo MD Electronically signed by Deitra Mayo MD on 02/23/2022 at 10:34:28 AM.      Final      Assessment & Plan:     Hallucinations: New Started after TAVR - ? From anesthesia --- would have expected it to have resolved by now ? Still has infection ? Dehydration ? Worsening CKD Cbc, bmp Discussed possible ED evaluation - they would like to hold off for now, but concerned about cause - may need ED evaluation   Mild jaundice, elevated LFTs: He appears slightly jaundiced - also pale Blood work comes back - elevated LFTs Given all of his other symptoms - advised his wife to call 911 and have EMS take him to the ED

## 2022-02-27 ENCOUNTER — Inpatient Hospital Stay (HOSPITAL_COMMUNITY)
Admission: EM | Admit: 2022-02-27 | Discharge: 2022-03-04 | DRG: 683 | Disposition: A | Discharge status: Home Health | Payer: Medicare Other | Attending: Internal Medicine | Admitting: Internal Medicine

## 2022-02-27 ENCOUNTER — Encounter (HOSPITAL_COMMUNITY): Payer: Self-pay

## 2022-02-27 ENCOUNTER — Emergency Department (HOSPITAL_COMMUNITY): Payer: Medicare Other

## 2022-02-27 ENCOUNTER — Ambulatory Visit (INDEPENDENT_AMBULATORY_CARE_PROVIDER_SITE_OTHER): Payer: Medicare Other | Admitting: Internal Medicine

## 2022-02-27 VITALS — BP 110/58 | HR 80 | Temp 98.0°F | Ht 70.0 in | Wt 164.0 lb

## 2022-02-27 DIAGNOSIS — R443 Hallucinations, unspecified: Secondary | ICD-10-CM | POA: Insufficient documentation

## 2022-02-27 DIAGNOSIS — N189 Chronic kidney disease, unspecified: Secondary | ICD-10-CM | POA: Diagnosis not present

## 2022-02-27 DIAGNOSIS — R54 Age-related physical debility: Secondary | ICD-10-CM | POA: Diagnosis present

## 2022-02-27 DIAGNOSIS — Z91048 Other nonmedicinal substance allergy status: Secondary | ICD-10-CM

## 2022-02-27 DIAGNOSIS — R601 Generalized edema: Secondary | ICD-10-CM | POA: Diagnosis not present

## 2022-02-27 DIAGNOSIS — Z950 Presence of cardiac pacemaker: Secondary | ICD-10-CM

## 2022-02-27 DIAGNOSIS — Z8249 Family history of ischemic heart disease and other diseases of the circulatory system: Secondary | ICD-10-CM

## 2022-02-27 DIAGNOSIS — R338 Other retention of urine: Secondary | ICD-10-CM

## 2022-02-27 DIAGNOSIS — Z8052 Family history of malignant neoplasm of bladder: Secondary | ICD-10-CM

## 2022-02-27 DIAGNOSIS — R509 Fever, unspecified: Secondary | ICD-10-CM | POA: Diagnosis not present

## 2022-02-27 DIAGNOSIS — I5032 Chronic diastolic (congestive) heart failure: Secondary | ICD-10-CM | POA: Diagnosis not present

## 2022-02-27 DIAGNOSIS — Z888 Allergy status to other drugs, medicaments and biological substances status: Secondary | ICD-10-CM

## 2022-02-27 DIAGNOSIS — Z66 Do not resuscitate: Secondary | ICD-10-CM

## 2022-02-27 DIAGNOSIS — Z881 Allergy status to other antibiotic agents status: Secondary | ICD-10-CM

## 2022-02-27 DIAGNOSIS — I2510 Atherosclerotic heart disease of native coronary artery without angina pectoris: Secondary | ICD-10-CM | POA: Diagnosis not present

## 2022-02-27 DIAGNOSIS — N184 Chronic kidney disease, stage 4 (severe): Secondary | ICD-10-CM | POA: Diagnosis present

## 2022-02-27 DIAGNOSIS — Z880 Allergy status to penicillin: Secondary | ICD-10-CM

## 2022-02-27 DIAGNOSIS — Z886 Allergy status to analgesic agent status: Secondary | ICD-10-CM

## 2022-02-27 DIAGNOSIS — Z803 Family history of malignant neoplasm of breast: Secondary | ICD-10-CM

## 2022-02-27 DIAGNOSIS — I5030 Unspecified diastolic (congestive) heart failure: Secondary | ICD-10-CM | POA: Diagnosis present

## 2022-02-27 DIAGNOSIS — I10 Essential (primary) hypertension: Secondary | ICD-10-CM | POA: Diagnosis present

## 2022-02-27 DIAGNOSIS — Z882 Allergy status to sulfonamides status: Secondary | ICD-10-CM

## 2022-02-27 DIAGNOSIS — N179 Acute kidney failure, unspecified: Secondary | ICD-10-CM | POA: Diagnosis not present

## 2022-02-27 DIAGNOSIS — C61 Malignant neoplasm of prostate: Secondary | ICD-10-CM

## 2022-02-27 DIAGNOSIS — Z955 Presence of coronary angioplasty implant and graft: Secondary | ICD-10-CM

## 2022-02-27 DIAGNOSIS — Z91040 Latex allergy status: Secondary | ICD-10-CM

## 2022-02-27 DIAGNOSIS — Z8546 Personal history of malignant neoplasm of prostate: Secondary | ICD-10-CM

## 2022-02-27 DIAGNOSIS — N1832 Chronic kidney disease, stage 3b: Secondary | ICD-10-CM

## 2022-02-27 DIAGNOSIS — M109 Gout, unspecified: Secondary | ICD-10-CM | POA: Diagnosis present

## 2022-02-27 DIAGNOSIS — D509 Iron deficiency anemia, unspecified: Secondary | ICD-10-CM | POA: Diagnosis present

## 2022-02-27 DIAGNOSIS — I130 Hypertensive heart and chronic kidney disease with heart failure and stage 1 through stage 4 chronic kidney disease, or unspecified chronic kidney disease: Secondary | ICD-10-CM | POA: Diagnosis present

## 2022-02-27 DIAGNOSIS — Z952 Presence of prosthetic heart valve: Secondary | ICD-10-CM

## 2022-02-27 DIAGNOSIS — E785 Hyperlipidemia, unspecified: Secondary | ICD-10-CM | POA: Diagnosis present

## 2022-02-27 DIAGNOSIS — Z951 Presence of aortocoronary bypass graft: Secondary | ICD-10-CM

## 2022-02-27 DIAGNOSIS — I441 Atrioventricular block, second degree: Secondary | ICD-10-CM | POA: Diagnosis present

## 2022-02-27 DIAGNOSIS — Z8042 Family history of malignant neoplasm of prostate: Secondary | ICD-10-CM

## 2022-02-27 DIAGNOSIS — G629 Polyneuropathy, unspecified: Secondary | ICD-10-CM | POA: Diagnosis present

## 2022-02-27 DIAGNOSIS — Z7982 Long term (current) use of aspirin: Secondary | ICD-10-CM

## 2022-02-27 DIAGNOSIS — R339 Retention of urine, unspecified: Secondary | ICD-10-CM | POA: Diagnosis present

## 2022-02-27 DIAGNOSIS — J90 Pleural effusion, not elsewhere classified: Secondary | ICD-10-CM

## 2022-02-27 DIAGNOSIS — Z823 Family history of stroke: Secondary | ICD-10-CM

## 2022-02-27 DIAGNOSIS — R7989 Other specified abnormal findings of blood chemistry: Secondary | ICD-10-CM

## 2022-02-27 DIAGNOSIS — Z91013 Allergy to seafood: Secondary | ICD-10-CM

## 2022-02-27 DIAGNOSIS — R627 Adult failure to thrive: Secondary | ICD-10-CM | POA: Diagnosis present

## 2022-02-27 DIAGNOSIS — R7401 Elevation of levels of liver transaminase levels: Secondary | ICD-10-CM | POA: Diagnosis present

## 2022-02-27 DIAGNOSIS — E872 Acidosis, unspecified: Secondary | ICD-10-CM | POA: Diagnosis present

## 2022-02-27 DIAGNOSIS — N320 Bladder-neck obstruction: Secondary | ICD-10-CM | POA: Diagnosis present

## 2022-02-27 DIAGNOSIS — K219 Gastro-esophageal reflux disease without esophagitis: Secondary | ICD-10-CM | POA: Diagnosis present

## 2022-02-27 DIAGNOSIS — R441 Visual hallucinations: Secondary | ICD-10-CM | POA: Diagnosis present

## 2022-02-27 DIAGNOSIS — Z79899 Other long term (current) drug therapy: Secondary | ICD-10-CM

## 2022-02-27 DIAGNOSIS — Z885 Allergy status to narcotic agent status: Secondary | ICD-10-CM

## 2022-02-27 LAB — CBC WITH DIFFERENTIAL/PLATELET
Abs Immature Granulocytes: 0.08 10*3/uL — ABNORMAL HIGH (ref 0.00–0.07)
Basophils Absolute: 0 10*3/uL (ref 0.0–0.1)
Basophils Relative: 0 % (ref 0.0–3.0)
Basophils Relative: 0 %
Eosinophils Absolute: 0.2 10*3/uL (ref 0.0–0.5)
Eosinophils Relative: 2 % (ref 0.0–5.0)
Eosinophils Relative: 3 %
HCT: 28 % — ABNORMAL LOW (ref 39.0–52.0)
HCT: 25.3 % — ABNORMAL LOW (ref 39.0–52.0)
Hemoglobin: 9.2 g/dL — ABNORMAL LOW (ref 13.0–17.0)
Hemoglobin: 8.7 g/dL — ABNORMAL LOW (ref 13.0–17.0)
Immature Granulocytes: 1 %
Lymphocytes Relative: 5 % — ABNORMAL LOW (ref 12.0–46.0)
Lymphocytes Relative: 9 %
Lymphs Abs: 0.6 10*3/uL — ABNORMAL LOW (ref 0.7–4.0)
MCH: 25.4 pg — ABNORMAL LOW (ref 26.0–34.0)
MCHC: 32.7 g/dL (ref 30.0–36.0)
MCHC: 34.4 g/dL (ref 30.0–36.0)
MCV: 76.2 fl — ABNORMAL LOW (ref 78.0–100.0)
MCV: 74 fL — ABNORMAL LOW (ref 80.0–100.0)
Monocytes Absolute: 0.5 10*3/uL (ref 0.1–1.0)
Monocytes Relative: 3 % (ref 3.0–12.0)
Monocytes Relative: 7 %
Neutro Abs: 5.4 10*3/uL (ref 1.7–7.7)
Neutrophils Relative %: 90 % — ABNORMAL HIGH (ref 43.0–77.0)
Neutrophils Relative %: 80 %
Platelets: 253 10*3/uL (ref 150.0–400.0)
Platelets: 261 10*3/uL (ref 150–400)
RBC: 3.67 Mil/uL — ABNORMAL LOW (ref 4.22–5.81)
RBC: 3.42 MIL/uL — ABNORMAL LOW (ref 4.22–5.81)
RDW: 16.3 % — ABNORMAL HIGH (ref 11.5–15.5)
RDW: 15.9 % — ABNORMAL HIGH (ref 11.5–15.5)
WBC: 9.1 10*3/uL (ref 4.0–10.5)
WBC: 6.8 10*3/uL (ref 4.0–10.5)
nRBC: 0 % (ref 0.0–0.2)

## 2022-02-27 LAB — COMPREHENSIVE METABOLIC PANEL
ALT: 186 U/L — ABNORMAL HIGH (ref 0–53)
ALT: 185 U/L — ABNORMAL HIGH (ref 0–44)
AST: 174 U/L — ABNORMAL HIGH (ref 0–37)
AST: 175 U/L — ABNORMAL HIGH (ref 15–41)
Albumin: 3.7 g/dL (ref 3.5–5.2)
Albumin: 3.3 g/dL — ABNORMAL LOW (ref 3.5–5.0)
Alkaline Phosphatase: 474 U/L — ABNORMAL HIGH (ref 39–117)
Alkaline Phosphatase: 437 U/L — ABNORMAL HIGH (ref 38–126)
Anion gap: 13 (ref 5–15)
BUN: 61 mg/dL — ABNORMAL HIGH (ref 6–23)
BUN: 65 mg/dL — ABNORMAL HIGH (ref 8–23)
CO2: 18 mEq/L — ABNORMAL LOW (ref 19–32)
CO2: 15 mmol/L — ABNORMAL LOW (ref 22–32)
Calcium: 10 mg/dL (ref 8.4–10.5)
Calcium: 9.6 mg/dL (ref 8.9–10.3)
Chloride: 104 mEq/L (ref 96–112)
Chloride: 104 mmol/L (ref 98–111)
Creatinine, Ser: 3.03 mg/dL — ABNORMAL HIGH (ref 0.40–1.50)
Creatinine, Ser: 3.29 mg/dL — ABNORMAL HIGH (ref 0.61–1.24)
GFR, Estimated: 17 mL/min — ABNORMAL LOW (ref >60)
GFR: 17.14 mL/min — ABNORMAL LOW (ref >60.00)
Glucose, Bld: 93 mg/dL (ref 70–99)
Glucose, Bld: 111 mg/dL — ABNORMAL HIGH (ref 70–99)
Potassium: 3.8 mEq/L (ref 3.5–5.1)
Potassium: 3.8 mmol/L (ref 3.5–5.1)
Sodium: 133 mEq/L — ABNORMAL LOW (ref 135–145)
Sodium: 132 mmol/L — ABNORMAL LOW (ref 135–145)
Total Bilirubin: 3.2 mg/dL — ABNORMAL HIGH (ref 0.2–1.2)
Total Bilirubin: 2.6 mg/dL — ABNORMAL HIGH (ref 0.3–1.2)
Total Protein: 6.5 g/dL (ref 6.0–8.3)
Total Protein: 6.3 g/dL — ABNORMAL LOW (ref 6.5–8.1)

## 2022-02-27 LAB — LIPASE, BLOOD: Lipase: 1076 U/L — ABNORMAL HIGH (ref 11–51)

## 2022-02-27 MED ORDER — FUROSEMIDE 10 MG/ML IJ SOLN
40.0000 mg | Freq: Once | INTRAMUSCULAR | Status: AC
  Administered 2022-02-27: 40 mg via INTRAVENOUS
  Filled 2022-02-27: qty 4

## 2022-02-27 NOTE — Assessment & Plan Note (Signed)
Chronic BP well controlled Continue amlodipine 10 mg daily, hydralazine 50 mg 3 times daily BMP

## 2022-02-27 NOTE — Assessment & Plan Note (Signed)
Chronic Increased leg edema, but does not appear to be in acute HF No change in medications

## 2022-02-27 NOTE — Patient Instructions (Addendum)
      Blood work was ordered.   The lab is on the first floor.    Medications changes include :   none    A referral was ordered for [place].     Someone will call you to schedule an appointment.     Return for follow up as scheduled in April.

## 2022-02-27 NOTE — Assessment & Plan Note (Addendum)
Admitted 2/14-2/15 for fever up to 103 on arrival Not septic No obvious cause Started on empiric antibiotics-IV Vanco and cefepime which were later stopped COVID, flu, RSV test negative UA, urine culture negative Blood cultures negative to date Echocardiogram showed no vegetation or abscess on valve-had recent TAVR Still appears very ill and looks like he probably still has an infection vs worsening CKD Deferred ED today Cbc, bmp Stressed increased PO intake If no improvement in next 24 hrs will need to go to ED

## 2022-02-27 NOTE — Assessment & Plan Note (Signed)
During recent hospitalization Creatinine 2.57 on admission-baseline is 1.8-2.0 Was volume depleted Creatinine improved with IV fluids Creatinine 2.34 on discharge BMP today Following with nephrology

## 2022-02-27 NOTE — ED Triage Notes (Signed)
Pt from home BIB [facility] for abnormal labs, BUN 61, Cr 3.03, AST 174, ALT 186. Pt reported  RUQ pain earlier today that has subsided. Recent AVR 02/06/2022. Pt denies CP or SOB at this time. VSS, A&O x4.

## 2022-02-27 NOTE — Assessment & Plan Note (Signed)
Chronic Following with urology Not currently on any treatment

## 2022-02-27 NOTE — ED Notes (Signed)
Portable US at the bedside

## 2022-02-27 NOTE — ED Notes (Signed)
Spoke with lab tech, lab to add on BNP to labs already sent down earlier

## 2022-02-27 NOTE — ED Notes (Signed)
Pt to CT at this time.

## 2022-02-27 NOTE — ED Provider Notes (Signed)
[facility] Provider Note   CSN: [id number] Arrival date & time: 02/27/22  2000     History  Chief Complaint  Patient presents with   Abnormal labs  Abd Pain    [name] is a 89 y.o. male.  HPI   Patient with extensive medical history including but not limited to TAVR January 30, ACE maker, aortic stenosis, AKI requiring recent hospitalization last week, CHF, hypertension, hyperlipidemia presents to the emergency department due to abnormal laboratory workup.  Patient states that since being discharged from his most recent hospitalization he has has not noticed a significant improvement of his symptoms.  He intermittently is having right upper quadrant abdominal pain, he feels weak all over and describes generalized aching pains.  He has not been have any nausea or vomiting but according to wife has been eating and drinking less.  Not particularly altered but since the TAVR in January patient has been having hallucinations of seeing people in the room with them which come and go, he has been followed by his PCP for this.  Patient was seen by his primary yesterday and had labs drawn and he had significant LFT elevation and continuing worsening renal impairment.  Sent to ED for further evaluation.  Patient states she is not having any pain on my evaluation.  Denies chest pain or shortness of breath.  Home Medications Prior to Admission medications   Medication Sig Start Date End Date Taking? Authorizing Provider  acetaminophen (TYLENOL) 500 MG tablet Take 1,000 mg by mouth 3 (three) times daily as needed for moderate pain.    [provider]  allopurinol (ZYLOPRIM) 100 MG tablet TAKE 1 TABLET(100 MG) BY MOUTH DAILY Patient taking differently: Take 100 mg by mouth daily. TAKE 1 TABLET(100 MG) BY MOUTH DAILY 12/04/21   [name], MD  amLODipine (NORVASC) 10 MG tablet Take 1 tablet (10 mg total) by mouth daily. 02/07/22    Eileen Stanford, PA-C  amoxicillin (AMOXIL) 500 MG tablet TAKE 4 TABLETS (2000 MG) BY MOUTH 1 HOUR PRIOR TO DENTAL APPOINTMENTS AND PROCEDURES 02/14/22   Tommie Raymond, NP  Aromatic Inhalants (VICKS VAPOR INHALER IN) Place 1 puff into both nostrils as needed (for congestion).    [provider]  Ascorbic Acid (VITAMIN C) 1000 MG tablet Take 1,000 mg by mouth daily.    [provider]  aspirin EC 81 MG tablet Take 81 mg by mouth daily.    [provider]  azithromycin (ZITHROMAX) 250 MG tablet Take 2 tablets (500 mg total) by mouth daily for 1 day, THEN 1 tablet (250 mg total) daily for 4 days. 02/22/22 02/27/22  Patrecia Pour, MD  Calcium Citrate-Vitamin D (CITRACAL + D PO) Take 2 tablets by mouth in the morning and at bedtime.    [provider]  Carboxymeth-Glycerin-Polysorb (REFRESH OPTIVE MEGA-3 OP) Place 1 drop into both eyes 2 (two) times daily.    [provider]  Cholecalciferol 25 MCG (1000 UT) capsule Take 1,000 Units by mouth daily.    [provider]  cyanocobalamin (,VITAMIN B-12,) 1000 MCG/ML injection Inject 1,000 mcg into the muscle once. Monthly injection    [provider]  folic acid (FOLVITE) 1 MG tablet Take 1 tablet (1 mg total) by mouth daily. Annual appt due in May must see provider for future refills Patient taking differently: Take 1 mg by mouth at bedtime. Annual appt due in May must see provider  for future refills 12/04/21   [name], MD  furosemide (LASIX) 40 MG tablet TAKE 1 TABLET BY MOUTH EVERY DAY Patient taking differently: Take 40 mg by mouth as needed for fluid or edema. 04/05/21   [name], MD  hydrALAZINE (APRESOLINE) 50 MG tablet Take 1 tablet (50 mg total) by mouth 3 (three) times daily. 02/08/22   Eileen Stanford, PA-C  loratadine (CLARITIN) 10 MG tablet Take 10 mg by mouth daily as needed (for seasonal allergies).    [provider]  Multiple Vitamins-Minerals  (PRESERVISION AREDS 2 PO) Take 1 tablet by mouth in the morning and at bedtime.    [provider]  nitroGLYCERIN (NITROSTAT) 0.4 MG SL tablet DISSOLVE 1 TABLET UNDER THE TONGUE EVERY 5 MINUTES AS NEEDED FOR CHEST PAIN, MAXIMUM 3 TABLETS Patient taking differently: Place 0.4 mg under the tongue every 5 (five) minutes as needed for chest pain. 04/30/19   [name], MD  Saline (AYR NASAL MIST ALLERGY/SINUS NA) Place 2 sprays into the nose as needed (dryness).    [provider]  Vitamin D, Ergocalciferol, (DRISDOL) 1.25 MG (50000 UNIT) CAPS capsule Take by mouth. 07/13/21   [provider]      Allergies    Aspirin, Lisinopril, Amoxicillin, Atarax [hydroxyzine hcl], Cephalexin, Ciprofloxacin, Clindamycin, Clobetasol, Codeine, Fish allergy, Fluarix [influenza virus vaccine], Haemophilus influenzae, Hydrocodone, Hydrocodone-acetaminophen, Hydroxyzine, Latex, Macrobid [nitrofurantoin macrocrystal], Niacin, Niacin-lovastatin er, Niacin-lovastatin er, Nitrofurantoin, Omeprazole, Other, Tramadol, Vibramycin [doxycycline], Adhesive [tape], Bactrim [sulfamethoxazole-trimethoprim], Colchicine, Gabapentin, and Nortriptyline    Review of Systems   Review of Systems  Physical Exam Updated Vital Signs BP 122/65   Pulse 64   Temp 98 F (36.7 C) (Oral)   Resp 18   Ht 5' 10"$  (1.778 m)   Wt 70.8 kg   SpO2 96%   BMI 22.38 kg/m  Physical Exam Vitals and nursing note reviewed. Exam conducted with a chaperone present.  Constitutional:      Appearance: Normal appearance.  HENT:     Head: Normocephalic and atraumatic.  Eyes:     General: No scleral icterus.       Right eye: No discharge.        Left eye: No discharge.     Extraocular Movements: Extraocular movements intact.     Pupils: Pupils are equal, round, and reactive to light.  Cardiovascular:     Rate and Rhythm: Normal rate and regular rhythm.     Pulses: Normal pulses.     Heart sounds: Murmur heard.     No  friction rub. No gallop.  Pulmonary:     Effort: Pulmonary effort is normal. No respiratory distress.     Breath sounds: Normal breath sounds.  Abdominal:     General: Abdomen is flat. Bowel sounds are normal. There is no distension.     Palpations: Abdomen is soft.     Tenderness: There is no abdominal tenderness.     Comments: Abdomen is nontender on my evaluation.  Musculoskeletal:     Right lower leg: Edema present.     Left lower leg: Edema present.  Skin:    General: Skin is warm and dry.     Coloration: Skin is not jaundiced.  Neurological:     Mental Status: He is alert. Mental status is at baseline.     Coordination: Coordination normal.     ED Results / Procedures / Treatments   Labs (all labs ordered are listed, but only abnormal results are  displayed) Labs Reviewed  CBC WITH DIFFERENTIAL/PLATELET - Abnormal; Notable for the following components:      Result Value   RBC 3.42 (*)    Hemoglobin 8.7 (*)    HCT 25.3 (*)    MCV 74.0 (*)    MCH 25.4 (*)    RDW 15.9 (*)    Lymphs Abs 0.6 (*)    Abs Immature Granulocytes 0.08 (*)    All other components within normal limits  COMPREHENSIVE METABOLIC PANEL - Abnormal; Notable for the following components:   Sodium 132 (*)    CO2 15 (*)    Glucose, Bld 111 (*)    BUN 65 (*)    Creatinine, Ser 3.29 (*)    Total Protein 6.3 (*)    Albumin 3.3 (*)    AST 175 (*)    ALT 185 (*)    Alkaline Phosphatase 437 (*)    Total Bilirubin 2.6 (*)    GFR, Estimated 17 (*)    All other components within normal limits  LIPASE, BLOOD - Abnormal; Notable for the following components:   Lipase 1,076 (*)    All other components within normal limits  BRAIN NATRIURETIC PEPTIDE    EKG EKG Interpretation  Date/Time:  Tuesday February 27 2022 20:12:38 EST Ventricular Rate:  75 PR Interval:  195 QRS Duration: 166 QT Interval:  459 QTC Calculation: 489 R Axis:   268 Text Interpretation: Atrial-paced complexes RBBB and LAFB No  significant change since last tracing Confirmed by Blanchie Dessert [phone]) on 02/27/2022 8:42:01 PM  Radiology CT ABDOMEN PELVIS WO CONTRAST  Result Date: 02/27/2022 CLINICAL DATA:  Abnormal lab results right upper quadrant pain EXAM: CT ABDOMEN AND PELVIS WITHOUT CONTRAST TECHNIQUE: Multidetector CT imaging of the abdomen and pelvis was performed following the standard protocol without IV contrast. RADIATION DOSE REDUCTION: This exam was performed according to the departmental dose-optimization program which includes automated exposure control, adjustment of the mA and/or kV according to patient size and/or use of iterative reconstruction technique. COMPARISON:  Ultrasound 02/27/2022, CT 01/10/2022, PET CT 12/21/2021 FINDINGS: Lower chest: Lung bases demonstrate small left and moderate right pleural effusions. Dependent atelectasis at the bases. Coronary vascular calcifications. Partially visualized intracardiac pacing leads. Interval TAVR. Cardiomegaly with trace pericardial effusion. Hepatobiliary: No focal hepatic abnormality or biliary dilatation. No calcified stone. Slightly distended gallbladder with diffuse gallbladder wall thickening and possible pericholecystic fluid. Pancreas: Unremarkable. No pancreatic ductal dilatation or surrounding inflammatory changes. Spleen: Borderline enlarged. Adrenals/Urinary Tract: Adrenal glands are normal. Kidneys show no hydronephrosis. Bilateral renal cysts for which no imaging follow-up is recommended. Hyperdense subcentimeter cortical lesions in the kidneys measuring up to 9 mm too small to further characterize. Small right kidney. The bladder is unremarkable Stomach/Bowel: The stomach is nonenlarged. There is no dilated small bowel. No acute bowel wall thickening. Vascular/Lymphatic: Moderate aortic atherosclerosis. No aneurysm. No suspicious lymph nodes. Reproductive: Prostatectomy. Stable soft tissue thickening at the right aspect of the prostatectomy bed  measuring 2 x 1.5 cm. Other: No free air. Small volume free fluid in the abdomen and pelvis. Generalized subcutaneous edema consistent with anasarca. Metallic foreign body at the anterior pelvis with artifact. Musculoskeletal: No acute or suspicious osseous abnormality. IMPRESSION: 1. Slightly distended gallbladder with diffuse gallbladder wall thickening and possible pericholecystic fluid. Reference previously same day right upper quadrant ultrasound. 2. Cardiomegaly with small left and moderate right pleural effusions. Small volume free fluid in the abdomen and pelvis. Generalized subcutaneous edema consistent with anasarca. 3. Aortic atherosclerosis. Aortic  Atherosclerosis (ICD10-I70.0). Electronically Signed   By: Donavan Foil M.D.   On: 02/27/2022 23:19   US Abdomen Limited RUQ (LIVER/GB)  Result Date: 02/27/2022 CLINICAL DATA:  Abdominal pain. EXAM: ULTRASOUND ABDOMEN LIMITED RIGHT UPPER QUADRANT COMPARISON:  CT of the chest abdomen pelvis dated 01/10/2022. FINDINGS: Gallbladder: There is no gallstone. The gallbladder wall is thickened measuring 4 mm. There is pericholecystic fluid. Negative sonographic Murphy's sign. Common bile duct: Diameter: 4 mm Liver: The liver is unremarkable. Portal vein is patent on color Doppler imaging with normal direction of blood flow towards the liver. Other: There is CAD partially visualized right pleural effusion. A 4.5 cm right renal upper pole cyst. IMPRESSION: 1. No gallstone. Thickened gallbladder wall with small pericholecystic fluid of indeterminate etiology. Clinical correlation is recommended. A hepatobiliary scintigraphy may provide better evaluation of the gallbladder if there is a high clinical concern for acute cholecystitis . 2. Right pleural effusion. Electronically Signed   By: Anner Crete M.D.   On: 02/27/2022 21:41    Procedures Procedures    Medications Ordered in ED Medications  furosemide (LASIX) injection 40 mg (has no administration in  time range)    ED Course/ Medical Decision Making/ A&P                             Medical Decision Making Amount and/or Complexity of Data Reviewed Labs: ordered. Radiology: ordered.  Risk Prescription drug management. Decision regarding hospitalization.   Patient presents to the emergency department due to abnormal labs.  Differential includes worsening renal failure, cholecystitis, choledocholithiasis, pancreatitis, dehydration, medication reaction, hepatorenal.   I reviewed external medical records including HPI noted hospitalization notes as documented in HPI.  Patient's wife is at bedside providing dependent history.  Patient is having persistent weakness and worsening renal progression despite previous workup and evaluation.  He was having abdominal pain intermittently last few days, laboratory workup ordered by PCP showed new transaminitis.  Although his abdomen is nontender on my evaluation given the or transaminitis we will proceed with right upper quadrant ultrasound for further evaluation.  BUN  Date Value Ref Range Status  02/27/2022 65 (H) 8 - 23 mg/dL Final  02/27/2022 61 (H) 6 - 23 mg/dL Final  02/22/2022 38 (H) 8 - 23 mg/dL Final  02/21/2022 39 (H) 8 - 23 mg/dL Final  12/20/2021 44 (H) 10 - 36 mg/dL Final  11/04/2017 29 (H) 8 - 27 mg/dL Final  09/10/2013 30 (A) 4 - 21 mg/dL Final   Creatinine  Date Value Ref Range Status  05/19/2020 2.15 (H) 0.61 - 1.24 mg/dL Final   Creatinine, Ser  Date Value Ref Range Status  02/27/2022 3.29 (H) 0.61 - 1.24 mg/dL Final  02/27/2022 3.03 (H) 0.40 - 1.50 mg/dL Final  02/22/2022 2.34 (H) 0.61 - 1.24 mg/dL Final  02/21/2022 2.57 (H) 0.61 - 1.24 mg/dL Final    02/08/22: Cr 1.85  Right upper quadrant ultrasound negative for any acute process.  Patient's lipase was elevated in the thousands, will proceed with CT abdomen without contrast.  CMP shows transaminitis, patient has anemia on the CBC which is not grossly changed.  No  leukocytosis.  CT abdomen and pelvis shows findings concerning for anasarca.  Given this and conjunction with elevated LFTs, weakness, worsening renal failure I do feel patient needs admission for further evaluation and management of anasarca.  I spoke with hospitalist Kristopher Oppenheim who agrees with admission.  Final Clinical Impression(s) / ED Diagnoses Final diagnoses:  Acute renal failure, unspecified acute renal failure type [facility])  LFT elevation  Anasarca    Rx / DC Orders ED Discharge Orders     None         Sherrill Raring, Hershal Coria 02/27/22 2337    Blanchie Dessert, MD 02/28/22 2144

## 2022-02-28 ENCOUNTER — Observation Stay (HOSPITAL_COMMUNITY): Payer: Medicare Other

## 2022-02-28 ENCOUNTER — Other Ambulatory Visit: Payer: Self-pay

## 2022-02-28 DIAGNOSIS — Z952 Presence of prosthetic heart valve: Secondary | ICD-10-CM

## 2022-02-28 DIAGNOSIS — I441 Atrioventricular block, second degree: Secondary | ICD-10-CM | POA: Diagnosis not present

## 2022-02-28 DIAGNOSIS — R601 Generalized edema: Secondary | ICD-10-CM

## 2022-02-28 DIAGNOSIS — Z951 Presence of aortocoronary bypass graft: Secondary | ICD-10-CM

## 2022-02-28 DIAGNOSIS — Z66 Do not resuscitate: Secondary | ICD-10-CM

## 2022-02-28 DIAGNOSIS — N179 Acute kidney failure, unspecified: Secondary | ICD-10-CM | POA: Diagnosis not present

## 2022-02-28 DIAGNOSIS — R338 Other retention of urine: Secondary | ICD-10-CM | POA: Diagnosis not present

## 2022-02-28 DIAGNOSIS — I10 Essential (primary) hypertension: Secondary | ICD-10-CM

## 2022-02-28 DIAGNOSIS — J90 Pleural effusion, not elsewhere classified: Secondary | ICD-10-CM

## 2022-02-28 DIAGNOSIS — I5032 Chronic diastolic (congestive) heart failure: Secondary | ICD-10-CM

## 2022-02-28 DIAGNOSIS — N184 Chronic kidney disease, stage 4 (severe): Secondary | ICD-10-CM

## 2022-02-28 DIAGNOSIS — R7989 Other specified abnormal findings of blood chemistry: Secondary | ICD-10-CM

## 2022-02-28 LAB — CBC WITH DIFFERENTIAL/PLATELET
Abs Immature Granulocytes: 0.07 10*3/uL (ref 0.00–0.07)
Basophils Absolute: 0 10*3/uL (ref 0.0–0.1)
Basophils Relative: 0 %
Eosinophils Absolute: 0.2 10*3/uL (ref 0.0–0.5)
Eosinophils Relative: 4 %
HCT: 23.7 % — ABNORMAL LOW (ref 39.0–52.0)
Hemoglobin: 8.2 g/dL — ABNORMAL LOW (ref 13.0–17.0)
Immature Granulocytes: 1 %
Lymphocytes Relative: 11 %
Lymphs Abs: 0.7 10*3/uL (ref 0.7–4.0)
MCH: 25.1 pg — ABNORMAL LOW (ref 26.0–34.0)
MCHC: 34.6 g/dL (ref 30.0–36.0)
MCV: 72.5 fL — ABNORMAL LOW (ref 80.0–100.0)
Monocytes Absolute: 0.5 10*3/uL (ref 0.1–1.0)
Monocytes Relative: 8 %
Neutro Abs: 4.4 10*3/uL (ref 1.7–7.7)
Neutrophils Relative %: 76 %
Platelets: 236 10*3/uL (ref 150–400)
RBC: 3.27 MIL/uL — ABNORMAL LOW (ref 4.22–5.81)
RDW: 15.5 % (ref 11.5–15.5)
WBC: 5.8 10*3/uL (ref 4.0–10.5)
nRBC: 0 % (ref 0.0–0.2)

## 2022-02-28 LAB — URINALYSIS, ROUTINE W REFLEX MICROSCOPIC
Bilirubin Urine: NEGATIVE
Glucose, UA: NEGATIVE mg/dL
Hgb urine dipstick: NEGATIVE
Ketones, ur: NEGATIVE mg/dL
Leukocytes,Ua: NEGATIVE
Nitrite: NEGATIVE
Protein, ur: NEGATIVE mg/dL
Specific Gravity, Urine: 1.008 (ref 1.005–1.030)
pH: 5 (ref 5.0–8.0)

## 2022-02-28 LAB — MAGNESIUM: Magnesium: 2.4 mg/dL (ref 1.7–2.4)

## 2022-02-28 LAB — HEPATITIS PANEL, ACUTE
HCV Ab: NONREACTIVE
Hep A IgM: NONREACTIVE
Hep B C IgM: NONREACTIVE
Hepatitis B Surface Ag: NONREACTIVE

## 2022-02-28 LAB — AMMONIA: Ammonia: 13 umol/L (ref 9–35)

## 2022-02-28 LAB — COMPREHENSIVE METABOLIC PANEL
ALT: 174 U/L — ABNORMAL HIGH (ref 0–44)
AST: 162 U/L — ABNORMAL HIGH (ref 15–41)
Albumin: 3 g/dL — ABNORMAL LOW (ref 3.5–5.0)
Alkaline Phosphatase: 453 U/L — ABNORMAL HIGH (ref 38–126)
Anion gap: 9 (ref 5–15)
BUN: 68 mg/dL — ABNORMAL HIGH (ref 8–23)
CO2: 18 mmol/L — ABNORMAL LOW (ref 22–32)
Calcium: 9.3 mg/dL (ref 8.9–10.3)
Chloride: 104 mmol/L (ref 98–111)
Creatinine, Ser: 3.23 mg/dL — ABNORMAL HIGH (ref 0.61–1.24)
GFR, Estimated: 17 mL/min — ABNORMAL LOW (ref >60)
Glucose, Bld: 93 mg/dL (ref 70–99)
Potassium: 3.5 mmol/L (ref 3.5–5.1)
Sodium: 131 mmol/L — ABNORMAL LOW (ref 135–145)
Total Bilirubin: 2.3 mg/dL — ABNORMAL HIGH (ref 0.3–1.2)
Total Protein: 5.8 g/dL — ABNORMAL LOW (ref 6.5–8.1)

## 2022-02-28 LAB — PREALBUMIN: Prealbumin: 12 mg/dL — ABNORMAL LOW (ref 18–38)

## 2022-02-28 LAB — LIPASE, BLOOD: Lipase: 1303 U/L — ABNORMAL HIGH (ref 11–51)

## 2022-02-28 LAB — BRAIN NATRIURETIC PEPTIDE: B Natriuretic Peptide: 490.4 pg/mL — ABNORMAL HIGH (ref 0.0–100.0)

## 2022-02-28 MED ORDER — ONDANSETRON HCL 4 MG PO TABS: 4.0000 mg | ORAL_TABLET | Freq: Four times a day (QID) | ORAL | Status: DC | PRN

## 2022-02-28 MED ORDER — TECHNETIUM TC 99M MEBROFENIN IV KIT
7.8000 | PACK | Freq: Once | INTRAVENOUS | Status: AC | PRN
  Administered 2022-02-28: 7.8 via INTRAVENOUS

## 2022-02-28 MED ORDER — ONDANSETRON HCL 4 MG/2ML IJ SOLN: 4.0000 mg | Freq: Four times a day (QID) | INTRAMUSCULAR | Status: DC | PRN

## 2022-02-28 MED ORDER — ACETAMINOPHEN 650 MG RE SUPP: 650.0000 mg | Freq: Four times a day (QID) | RECTAL | Status: DC | PRN

## 2022-02-28 MED ORDER — BUMETANIDE 0.25 MG/ML IJ SOLN
1.0000 mg | Freq: Two times a day (BID) | INTRAMUSCULAR | Status: DC
  Administered 2022-02-28 – 2022-03-01 (×3): 1 mg via INTRAVENOUS
  Filled 2022-02-28 (×4): qty 4

## 2022-02-28 MED ORDER — ACETAMINOPHEN 325 MG PO TABS: 650.0000 mg | ORAL_TABLET | Freq: Four times a day (QID) | ORAL | Status: DC | PRN

## 2022-02-28 MED ORDER — HEPARIN SODIUM (PORCINE) 5000 UNIT/ML IJ SOLN
5000.0000 [IU] | Freq: Three times a day (TID) | INTRAMUSCULAR | Status: DC
  Administered 2022-02-28 – 2022-03-03 (×9): 5000 [IU] via SUBCUTANEOUS
  Filled 2022-02-28 (×9): qty 1

## 2022-02-28 NOTE — Progress Notes (Signed)
Progress Note   Patient: [name] [id number] DOB: [birth year] DOA: 02/27/2022     0 DOS: the patient was seen and examined on 02/28/2022   Brief hospital course: 89 year old male history of diastolic heart failure, hypertension, CKD stage IV, recent TAVR for severe aortic stenosis on February 06, 2022, history of second-degree AV block status post pacemaker, prior prostate cancer status post prostatectomy who presents to the ER today from the PCP office secondary to worsening renal failure and elevated LFTs.   Assessment and Plan: * Acute renal failure superimposed on stage 4 chronic kidney disease ([place]) Observation med/surg bed. Pt with acute urinary retention. Will place foley catheter. Worsening Scr. Pt has soft tissue mass near prostatectomy bed. PET/CT in 12-2021 could not see any activity due to intense radiotracer activity next to bladder. Pt voided 120 ml. Post-void bladder scan was 301. With post void of 575cc after foley placed -Cont with foley cath. Good urine output -recheck renal panel in AM   Acute urinary retention Will place foley catheter. Pt is s/p prostatectomy. PSA pending   Elevated LFTs Pt with elevated LFTs. No pain. CT and RUQ show thickened gallbladder wall without cholelithiasis. There is a small amount of pericholecystic fluid.  -Pt without RUQ pain. -HIDA reviewed, no common duct or cystic duct obstruction. Low normal GB EF -Liver appears normal on CT and [place] -Appreciate input by Cardiology. Unlikely passive hepatic congestion or hepatorenal syndrome -Will check acute hepatits panel. Recheck LFT's in AM   Anasarca Unclear what his anasarca is from. Pt's appetite is quite poor and his serum protein and albumin have been dropping since his TAVR.  -Norvasc was stopped   AV block, 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 Chronic.   Pleural effusion on right Continue diuresis. May need thoracentesis if diuresis fails to resolve pleural effusion.   DNR  (do not resuscitate)/DNI(Do Not Intubate) Admitting physican verified with pt and wife that he is a DNR/DNI.   (HFpEF) heart failure with preserved ejection fraction (HCC) Pt has been on lasix for about 12 months. Wife states pt not urinating as much with lasix. Pt changed to bumex to see if this increases his diuresis.   S/P TAVR (transcatheter aortic valve replacement) Stable. Had echo 02-22-2022 that showed stable TAVR and LVEF 55%.   Essential hypertension Hold hydralazine and norvasc during diuresis.      Subjective: Pleasantly confused this AM  Physical Exam: Vitals:   02/28/22 0800 02/28/22 1035 02/28/22 1100 02/28/22 1656  BP: 131/65  (!) 114/53 131/62  Pulse: 66  64 65  Resp: (!) 22  18 18  $ Temp:  98 F (36.7 C)  98.1 F (36.7 C)  TempSrc:  Oral  Oral  SpO2: 98%  96% 97%  Weight:      Height:       General exam: Awake, laying in bed, in nad Respiratory system: Normal respiratory effort, no wheezing Cardiovascular system: regular rate, s1, s2 Gastrointestinal system: Soft, nondistended, positive BS Central nervous system: CN2-12 grossly intact, strength intact Extremities: Perfused, no clubbing Skin: Normal skin turgor, no notable skin lesions seen Psychiatry: Mood normal // no visual hallucinations   Data Reviewed:  Labs reviewed: Na 131, K 3.5, Cr 3.23  Family Communication: Pt in room, family at bedside  Disposition: Status is: Observation The patient will require care spanning > 2 midnights and should be moved to inpatient because: Severity of illness  Planned Discharge Destination:  Unclear at this time    Author:  Marylu Lund, MD 02/28/2022 5:41 PM  For on call review [email or web address].

## 2022-02-28 NOTE — ED Notes (Signed)
Pt is still in nuclear medicine at this time

## 2022-02-28 NOTE — ED Notes (Signed)
ED TO INPATIENT HANDOFF REPORT  ED Nurse Name and Phone #: Torti [phone]  S Name/Age/Gender [name] 89 y.o. male Room/Bed: TRACC/TRACC  Code Status   Code Status: DNR  Home/SNF/Other Home Patient oriented to: self, place, time, and situation Is this baseline? Yes   Triage Complete: Triage complete  Chief Complaint Acute renal failure superimposed on stage 4 chronic kidney disease ([place]) [N17.9, N18.4]  Triage Note Pt from home BIB [facility] for abnormal labs, BUN 61, Cr 3.03, AST 174, ALT 186. Pt reported  RUQ pain earlier today that has subsided. Recent AVR 02/06/2022. Pt denies CP or SOB at this time. VSS, A&O x4.    Allergies Allergies  Allergen Reactions   Aspirin Nausea And Vomiting and Other (See Comments)    Upset stomach- tolerates coated aspirin    Lisinopril Anaphylaxis and Shortness Of Breath    After 3 tablets, he experienced trouble swallowing, throat tightness and hoarseness.    Amoxicillin Nausea Only   Atarax [Hydroxyzine Hcl] Nausea And Vomiting   Cephalexin Diarrhea   Ciprofloxacin Nausea Only   Clindamycin Other (See Comments)    Gi upset    Clobetasol Other (See Comments)    Not effective   Codeine Nausea Only and Other (See Comments)    Stomach upset   Fish Allergy Nausea And Vomiting   Fluarix [Influenza Virus Vaccine] Itching   Haemophilus Influenzae Itching   Hydrocodone Nausea Only   Hydrocodone-Acetaminophen Nausea And Vomiting   Hydroxyzine Nausea And Vomiting   Latex Itching and Other (See Comments)    (After flu shot)   Macrobid [Nitrofurantoin Macrocrystal] Nausea Only   Niacin Other (See Comments)    Unknown reaction   Niacin-Lovastatin Er Other (See Comments)    Unsure of reaction. Taking simvastatin at home without problems   Niacin-Lovastatin Er Other (See Comments)    Reaction not recalled   Nitrofurantoin Other (See Comments)    Upset stomach    Omeprazole Other (See Comments)    Reaction not recalled- stopped by MD    Other Nausea And Vomiting   Tramadol Other (See Comments)    Unknown    Vibramycin [Doxycycline] Other (See Comments)    Reaction not recalled   Adhesive [Tape] Rash   Bactrim [Sulfamethoxazole-Trimethoprim] Rash   Colchicine Rash   Gabapentin Other (See Comments)    Painful pimples on tongue   Nortriptyline Rash    Level of Care/Admitting Diagnosis ED Disposition     ED Disposition  Admit   Condition  --   [facility]: [place] [[id number]]  Level of Care: Med-Surg [16]  May place patient in observation at [facility] or [place] if equivalent level of care is available:: No  Covid Evaluation: Asymptomatic - no recent exposure (last 10 days) testing not required  Diagnosis: Acute renal failure superimposed on stage 4 chronic kidney disease [id number]  Admitting Physician: Bridgett Larsson [place]  Attending Physician: Bridgett Larsson, ERIC [[id number]]          B Medical/Surgery History Past Medical History:  Diagnosis Date   Anemia    AV block, 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 S/P stenting to mid RCA, prox PDA 06/1999. b. NSTEMI/CABG x 3 in 10/2011 with LIMA to LAD, SVG to PDA, and SVG to OM1.    Cephalalgia 07/14/2015   CHB (complete heart block) 05/03/2012   Overview:  Status post complete heart block heart rate 28 bpm, alternating with 2 to one AV block and drug for bradycardia.  Status post pacemaker implantation.status post Medtronic pacemaker implantation the 03/28/2012   Chronic diastolic CHF (congestive heart failure) 02/05/2018   Chronic gouty arthritis    Chronic UTI    a. Followed by Dr. Risa Grill - colonized/asymptomatic - not on abx   CKD (chronic kidney disease)    stage 3, GFR 30-59 ml/min; stable with a creatinine around 1.9-2.0 followed by nephrology.   Constipation 03/01/2015   Symptoms and exam consistent with constipation. Abdominal exam is  benign with no evidence of pain or obstruction. Discussed importance of increasing fiber and water intake coupled with physical activity to assist with bowel movements. Continue over-the-counter medication management as needed. Follow-up if symptoms worsen or fail to improve.   Coronary atherosclerosis 11/27/2011   Overview:  Multivessel coronary artery disease recently diagnosed by catheterization 2013 History of stent placement with a heparin-coated stent 2001  Last Assessment & Plan:  Status post coronary bypass grafting.  No recurrent chest pain. Overview:  Overview:  S/P stenting to mid RCA, prox PDA 06/1999;  06/2007 Myoview: negative except for apical thinning, EF 68%, last Myoview August 10, 2008 with n   Degeneration of lumbar intervertebral disc    Degenerative disc disease, cervical, with radiculopathy [id number]   Diastolic dysfunction [id number]   Grade 1 DD on Echo 06/2017   Dyslipidemia    Dysphagia 02/06/2016   3/18 - DG Esophagus:  1. Mild esophageal dysmotility, likely presbyesophagus. 2. No other explanation for patient's symptoms. 3. Small hiatal hernia.  On swallow eval - evidence of cervical spine disease and that was likely contributing   Epistaxis 12/08/2020   Essential hypertension 07/20/2006   GERD (gastroesophageal reflux disease)    Gout 05/12/2018   Gouty arthritis of right great toe 09/03/2017   Left toe Injected January 31, 2018    Hyperpigmentation 11/04/2017   Internal hemorrhoids 08/15/2016   Left inguinal hernia 07/01/2019   Lumbar post-laminectomy syndrome 12/12/2017   Moderate aortic regurgitation 07/04/2017   Echo 06/2017:  EF 55-60%, mild LVH, grade 1 DD, mild AS, mod AR, mild MR, mild-mod TR   Neck injury    a. 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  This was switched to by mouth amiodarone at the time of the thoracic surgery appointment was discontinued.   Presence of aortocoronary bypass graft 10/24/2011   Overview:  Performed at [facility] 2013 Last Assessment & Plan:  No complication post coronary bypass grafting.   Prostate cancer ([place])    a. 2001 s/p TURP.   Pulmonary nodule    a. felt to be noncancerous.  Status post followup CT scan 4 mm and stable.   Renal artery stenosis    a. 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 Burt Knack & Dr. 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 03/28/12   Trochanteric bursitis of hip, bilateral 01/10/2017   UGIB (upper gastrointestinal bleed) 02/01/2018   EGD  02/06/18 - mod, non-erosive gastritis   Venous insufficiency of leg 06/05/2010   Vitamin B12 deficiency 08/23/2009   Jan '14  July '14 B12 level  >1500    472   Weakness of both lower extremities 04/09/2020   Past Surgical History:  Procedure Laterality Date   ANTERIOR CHAMBER WASHOUT Left 10/04/2018   Procedure: Anterior Chamber Washout, Vitreous Tap;  Surgeon: Jalene Mullet, MD;  Location: [place];  Service: Ophthalmology;  Laterality: Left;   cardia catherization  07-07-99   CARDIAC SURGERY  10/18/12   open heart surgery   CATARACT EXTRACTION W/ INTRAOCULAR LENS  IMPLANT, BILATERAL  3/205, 06/2013   [facility]   COLONOSCOPY  04/12/07   CORONARY ARTERY BYPASS GRAFT  10/19/2011   Procedure: CORONARY ARTERY BYPASS GRAFTING (CABG);  Surgeon: Gaye Pollack, MD;  Location: [place];  Service: Open Heart Surgery;  Laterality: N/A;  times three using Left Internal Mammary Artery and Right Greater Saphenouse Vein Graft harvested Endoscopically   edg  07-17-1994   FLEXIBLE SIGMOIDOSCOPY  11-03-1997   GAS INSERTION Left 10/04/2018   Procedure: Insertion Of Gas;  Surgeon: Jalene Mullet, MD;  Location: [place];  Service: Ophthalmology;  Laterality: Left;   GAS/FLUID EXCHANGE Left 10/04/2018   Procedure: Gas/Fluid Exchange;  Surgeon: Jalene Mullet, MD;  Location: [place];  Service: Ophthalmology;  Laterality: Left;   INTRAOPERATIVE TRANSTHORACIC ECHOCARDIOGRAM N/A 02/06/2022   Procedure: INTRAOPERATIVE TRANSTHORACIC ECHOCARDIOGRAM;  Surgeon: [name], MD;  Location: [place] [facility];  Service: Open Heart Surgery;  Laterality: N/A;   LEFT HEART CATHETERIZATION WITH CORONARY ANGIOGRAM N/A 10/16/2011   Procedure: LEFT HEART CATHETERIZATION WITH CORONARY ANGIOGRAM;  Surgeon: Peter M Martinique, MD;  Location: [facility] CATH LAB;  Service: Cardiovascular;  Laterality: N/A;   LEFT HEART CATHETERIZATION WITH CORONARY/GRAFT ANGIOGRAM N/A 03/11/2013   Procedure: LEFT HEART CATHETERIZATION WITH [name];  Surgeon: Blane Ohara, MD;  Location: [facility] CATH LAB;  Service: Cardiovascular;  Laterality: N/A;   lumbar spinal disk and neck fusion surgery     PACEMAKER INSERTION  03/28/12   PPM implanted for mobitz II AV block   PARS PLANA VITRECTOMY Left 10/04/2018   Procedure: PARS PLANA VITRECTOMY 25 GAUGE FOR ENDOPHTHALMITIS WITH INJECTION OF INTRAVITREAL ANTIBIOTIC;  Surgeon: Jalene Mullet, MD;  Location: [place];  Service: Ophthalmology;  Laterality: Left;   peripheral vascular catherization  11-24-03   PERMANENT PACEMAKER INSERTION N/A 03/28/2012   Procedure: PERMANENT PACEMAKER INSERTION;  Surgeon: Thompson Grayer, MD;  Location: [facility] CATH LAB;  Service: Cardiovascular;  Laterality: N/A;   PROSTATECTOMY     renal circulation  10-01-03   s/p ptca     stents     X 2   stress cardiolite   05-04-05   spring 09-negative except for apical thinning, EF 68%   TRANSCATHETER AORTIC VALVE REPLACEMENT, TRANSFEMORAL Right 02/06/2022   Procedure: Transcatheter Aortic Valve Replacement, Transfemoral;  Surgeon: [name], MD;  Location: [place] [facility];  Service: Open Heart Surgery;  Laterality: Right;     A IV Location/Drains/Wounds Patient Lines/Drains/Airways Status     Active Line/Drains/Airways     Name Placement date Placement time Site Days   Peripheral IV 02/27/22 18 G Anterior;Left Forearm 02/27/22  2016  Forearm  1   Urethral Catheter Mandie, RN Straight-tip;Non-latex 16 Fr. 02/28/22  [id number]  Straight-tip;Non-latex  less than 1            Intake/Output Last 24 hours  Intake/Output Summary (Last 24 hours) at 02/28/2022 1132 Last data filed at 02/28/2022 0516 Gross per 24 hour  Intake --  Output 1270 ml  Net -1270 ml    Labs/Imaging Results for orders placed or performed during the hospital encounter of 02/27/22 (from the past 48 hour(s))  CBC with Differential     Status: Abnormal   Collection Time: 02/27/22  7:58 PM  Result Value Ref Range   WBC 6.8 4.0 - 10.5 K/uL   RBC 3.42 (L) 4.22 - 5.81 MIL/uL   Hemoglobin 8.7 (L) 13.0 - 17.0 g/dL    Comment: Reticulocyte Hemoglobin testing may be clinically indicated, consider ordering this additional test [id number]    HCT 25.3 (L) 39.0 - 52.0 %   MCV 74.0 (L) 80.0 - 100.0 fL   MCH 25.4 (L) 26.0 - 34.0 pg   MCHC 34.4 30.0 - 36.0 g/dL   RDW 15.9 (H) 11.5 - 15.5 %   Platelets 261 150 - 400 K/uL   nRBC 0.0 0.0 - 0.2 %   Neutrophils Relative % 80 %   Neutro Abs 5.4 1.7 - 7.7 K/uL   Lymphocytes Relative 9 %   Lymphs Abs 0.6 (L) 0.7 - 4.0 K/uL   Monocytes Relative 7 %   Monocytes Absolute 0.5 0.1 - 1.0 K/uL   Eosinophils Relative 3 %   Eosinophils Absolute 0.2 0.0 - 0.5 K/uL   Basophils Relative 0 %   Basophils Absolute 0.0 0.0 - 0.1 K/uL   Immature Granulocytes 1 %   Abs Immature Granulocytes 0.08 (H)  0.00 - 0.07 K/uL    Comment: Performed at [facility], [address], [place], [place] [address]  Comprehensive metabolic panel     Status: Abnormal   Collection Time: 02/27/22  7:58 PM  Result Value Ref Range   Sodium 132 (L) 135 - 145 mmol/L   Potassium 3.8 3.5 - 5.1 mmol/L   Chloride 104 98 - 111 mmol/L   CO2 15 (L) 22 - 32 mmol/L   Glucose, Bld 111 (H) 70 - 99 mg/dL    Comment: Glucose reference range applies only to samples taken after fasting for at least 8 hours.   BUN 65 (H) 8 - 23 mg/dL   Creatinine, Ser 3.29 (H) 0.61 - 1.24 mg/dL   Calcium 9.6 8.9 - 10.3 mg/dL   Total Protein 6.3 (L) 6.5 - 8.1 g/dL   Albumin 3.3 (L) 3.5 - 5.0 g/dL   AST 175 (H) 15 - 41 U/L   ALT 185 (H) 0 - 44 U/L   Alkaline Phosphatase 437 (H) 38 - 126 U/L   Total Bilirubin 2.6 (H) 0.3 - 1.2 mg/dL   GFR, Estimated 17 (L) >60 mL/min    Comment: (NOTE) Calculated using the CKD-EPI Creatinine Equation (2021)    Anion gap 13 5 - 15    Comment: Performed at [facility], [place] [address], [place], [place] [address]  Lipase, blood     Status: Abnormal   Collection Time: 02/27/22  7:58 PM  Result Value Ref Range   Lipase 1,076 (H) 11 - 51 U/L    Comment: RESULT CONFIRMED BY MANUAL DILUTION Performed at [facility] [address], [place],  [address]   Brain natriuretic peptide     Status: Abnormal   Collection Time: 02/27/22  7:58 PM  Result Value Ref Range   B Natriuretic Peptide 490.4 (H) 0.0 - 100.0 pg/mL    Comment: Performed at [facility] [address] [place], [place] [address]  Comprehensive metabolic panel     Status: Abnormal   Collection Time: 02/28/22  3:53 AM  Result Value Ref Range   Sodium 131 (L) 135 - 145 mmol/L   Potassium 3.5 3.5 - 5.1 mmol/L   Chloride 104 98 - 111 mmol/L   CO2 18 (L) 22 - 32 mmol/L   Glucose, Bld 93 70 - 99 mg/dL    Comment: Glucose reference range applies only to samples taken after fasting for at least 8 hours.   BUN 68 (H)  8 - 23 mg/dL   Creatinine, Ser 3.23 (H) 0.61 - 1.24 mg/dL   Calcium 9.3 8.9 - 10.3 mg/dL   Total Protein 5.8 (L) 6.5 - 8.1 g/dL   Albumin 3.0 (L) 3.5 - 5.0 g/dL   AST 162 (H) 15 - 41 U/L   ALT 174 (H) 0 - 44 U/L   Alkaline Phosphatase 453 (H) 38 - 126 U/L   Total Bilirubin 2.3 (H) 0.3 - 1.2 mg/dL   GFR, Estimated 17 (L) >60 mL/min    Comment: (NOTE) Calculated using the CKD-EPI Creatinine Equation (2021)    Anion gap 9 5 - 15    Comment: Performed at [facility], [place] [address], [place], [place] [address]  CBC with Differential/Platelet     Status: Abnormal   Collection Time: 02/28/22  3:53 AM  Result Value Ref Range   WBC 5.8 4.0 - 10.5 K/uL   RBC 3.27 (L) 4.22 - 5.81 MIL/uL   Hemoglobin 8.2 (L) 13.0 - 17.0 g/dL    Comment: Reticulocyte Hemoglobin testing may be clinically indicated, consider ordering this additional test [id number]    HCT 23.7 (L) 39.0 - 52.0 %   MCV 72.5 (L) 80.0 - 100.0 fL   MCH 25.1 (L) 26.0 - 34.0 pg   MCHC 34.6 30.0 - 36.0 g/dL   RDW 15.5 11.5 - 15.5 %   Platelets 236 150 - 400 K/uL   nRBC 0.0 0.0 - 0.2 %   Neutrophils Relative % 76 %   Neutro Abs 4.4 1.7 - 7.7 K/uL   Lymphocytes Relative 11 %   Lymphs Abs 0.7 0.7 - 4.0 K/uL   Monocytes Relative 8 %   Monocytes Absolute 0.5 0.1 - 1.0 K/uL   Eosinophils Relative 4 %   Eosinophils Absolute 0.2 0.0 - 0.5 K/uL   Basophils Relative 0 %   Basophils Absolute 0.0 0.0 - 0.1 K/uL   Immature Granulocytes 1 %   Abs Immature Granulocytes 0.07 0.00 - 0.07 K/uL    Comment: Performed at [facility], [address], [place], [place] [address]  Magnesium     Status: None   Collection Time: 02/28/22  3:53 AM  Result Value Ref Range   Magnesium 2.4 1.7 - 2.4 mg/dL    Comment: Performed at [facility] [address] [place], [place] [address]  Ammonia     Status: None   Collection Time: 02/28/22  3:53 AM  Result Value Ref Range   Ammonia 13 9 - 35 umol/L    Comment: Performed at [facility], [place] [address], [place], [place] [address]  Lipase, blood     Status: Abnormal   Collection Time: 02/28/22  3:53 AM  Result Value Ref Range  Lipase 1,303 (H) 11 - 51 U/L    Comment: RESULT CONFIRMED BY MANUAL DILUTION Performed at [facility], [place] [address] [place], [place] [address]   Prealbumin     Status: Abnormal   Collection Time: 02/28/22  3:53 AM  Result Value Ref Range   Prealbumin 12 (L) 18 - 38 mg/dL    Comment: Performed at [facility] [address], [place], [place] [address]  Urinalysis, Routine w reflex microscopic -Urine, Catheterized     Status: None   Collection Time: 02/28/22  4:49 AM  Result Value Ref Range   Color, Urine YELLOW YELLOW   APPearance CLEAR CLEAR   Specific Gravity, Urine 1.008 1.005 - 1.030   pH 5.0 5.0 - 8.0   Glucose, UA NEGATIVE NEGATIVE mg/dL   Hgb urine dipstick NEGATIVE NEGATIVE   Bilirubin Urine NEGATIVE NEGATIVE   Ketones, ur NEGATIVE NEGATIVE mg/dL   Protein, ur NEGATIVE NEGATIVE mg/dL   Nitrite NEGATIVE NEGATIVE   Leukocytes,Ua NEGATIVE NEGATIVE    Comment: Performed at [facility] [address], [place], [place] [address]   *Note: Due to a large number of results and/or encounters for the requested time period, some results have not been displayed. A complete set of results can be found in Results Review.   NM Hepato W/EF  Result Date: 02/28/2022 CLINICAL DATA:  Elevated liver function tests EXAM: NUCLEAR MEDICINE HEPATOBILIARY IMAGING WITH GALLBLADDER EF TECHNIQUE: Sequential images of the abdomen were obtained out to 60 minutes following intravenous administration of radiopharmaceutical. After oral ingestion of Ensure, gallbladder ejection fraction was determined. At 60 min, normal ejection fraction is greater than 33%. RADIOPHARMACEUTICALS:  7.8 mCi Tc-47m Choletec IV COMPARISON:  CT 02/27/2022 and ultrasound FINDINGS: Prompt uptake and biliary excretion of activity by the liver is seen.  Gallbladder activity is visualized, consistent with patency of cystic duct. Biliary activity passes into small bowel, consistent with patent common bile duct. Calculated gallbladder ejection fraction is 36%. (Normal gallbladder ejection fraction with Ensure is greater than 33%.) IMPRESSION: No common duct or cystic duct obstruction. Low normal gallbladder ejection fraction Electronically Signed   By: AJill SideM.D.   On: 02/28/2022 10:42   CT ABDOMEN PELVIS WO CONTRAST  Result Date: 02/27/2022 CLINICAL DATA:  Abnormal lab results right upper quadrant pain EXAM: CT ABDOMEN AND PELVIS WITHOUT CONTRAST TECHNIQUE: Multidetector CT imaging of the abdomen and pelvis was performed following the standard protocol without IV contrast. RADIATION DOSE REDUCTION: This exam was performed according to the departmental dose-optimization program which includes automated exposure control, adjustment of the mA and/or kV according to patient size and/or use of iterative reconstruction technique. COMPARISON:  Ultrasound 02/27/2022, CT 01/10/2022, PET CT 12/21/2021 FINDINGS: Lower chest: Lung bases demonstrate small left and moderate right pleural effusions. Dependent atelectasis at the bases. Coronary vascular calcifications. Partially visualized intracardiac pacing leads. Interval TAVR. Cardiomegaly with trace pericardial effusion. Hepatobiliary: No focal hepatic abnormality or biliary dilatation. No calcified stone. Slightly distended gallbladder with diffuse gallbladder wall thickening and possible pericholecystic fluid. Pancreas: Unremarkable. No pancreatic ductal dilatation or surrounding inflammatory changes. Spleen: Borderline enlarged. Adrenals/Urinary Tract: Adrenal glands are normal. Kidneys show no hydronephrosis. Bilateral renal cysts for which no imaging follow-up is recommended. Hyperdense subcentimeter cortical lesions in the kidneys measuring up to 9 mm too small to further characterize. Small right kidney. The  bladder is unremarkable Stomach/Bowel: The stomach is nonenlarged. There is no dilated small bowel. No acute bowel wall thickening. Vascular/Lymphatic:  Moderate aortic atherosclerosis. No aneurysm. No suspicious lymph nodes. Reproductive: Prostatectomy. Stable soft tissue thickening at the right aspect of the prostatectomy bed measuring 2 x 1.5 cm. Other: No free air. Small volume free fluid in the abdomen and pelvis. Generalized subcutaneous edema consistent with anasarca. Metallic foreign body at the anterior pelvis with artifact. Musculoskeletal: No acute or suspicious osseous abnormality. IMPRESSION: 1. Slightly distended gallbladder with diffuse gallbladder wall thickening and possible pericholecystic fluid. Reference previously same day right upper quadrant ultrasound. 2. Cardiomegaly with small left and moderate right pleural effusions. Small volume free fluid in the abdomen and pelvis. Generalized subcutaneous edema consistent with anasarca. 3. Aortic atherosclerosis. Aortic Atherosclerosis (ICD10-I70.0). Electronically Signed   By: Donavan Foil M.D.   On: 02/27/2022 23:19   US Abdomen Limited RUQ (LIVER/GB)  Result Date: 02/27/2022 CLINICAL DATA:  Abdominal pain. EXAM: ULTRASOUND ABDOMEN LIMITED RIGHT UPPER QUADRANT COMPARISON:  CT of the chest abdomen pelvis dated 01/10/2022. FINDINGS: Gallbladder: There is no gallstone. The gallbladder wall is thickened measuring 4 mm. There is pericholecystic fluid. Negative sonographic Murphy's sign. Common bile duct: Diameter: 4 mm Liver: The liver is unremarkable. Portal vein is patent on color Doppler imaging with normal direction of blood flow towards the liver. Other: There is CAD partially visualized right pleural effusion. A 4.5 cm right renal upper pole cyst. IMPRESSION: 1. No gallstone. Thickened gallbladder wall with small pericholecystic fluid of indeterminate etiology. Clinical correlation is recommended. A hepatobiliary scintigraphy may provide better  evaluation of the gallbladder if there is a high clinical concern for acute cholecystitis . 2. Right pleural effusion. Electronically Signed   By: Anner Crete M.D.   On: 02/27/2022 21:41    Pending Labs Unresulted Labs (From admission, onward)     Start     Ordered   02/28/22 0530  PSA  Once,   R        02/28/22 0530            Vitals/Pain Today's Vitals   02/28/22 0450 02/28/22 0456 02/28/22 0800 02/28/22 1035  BP: 134/78  131/65   Pulse: 66  66   Resp: 19  (!) 22   Temp:  98.6 F (37 C)  98 F (36.7 C)  TempSrc:    Oral  SpO2: 99%  98%   Weight:      Height:      PainSc:        Isolation Precautions No active isolations  Medications Medications  furosemide (LASIX) injection 40 mg (40 mg Intravenous Given 02/27/22 2337)  technetium TC 9M mebrofenin (CHOLETEC) injection 7.8 millicurie (7.8 millicuries Intravenous Contrast Given 02/28/22 0804)    Mobility walks     Focused Assessments ABD    R Recommendations: See Admitting Provider Note  Report given to:   Additional Notes: Waiting to hear from surgery

## 2022-02-28 NOTE — ED Notes (Signed)
PURPLE DNR ARMBAND APPLIED TO PT'S RIGHT WRIST

## 2022-02-28 NOTE — Assessment & Plan Note (Signed)
Pt with elevated LFTs. No pain. CT and RUQ show thickened gallbladder wall without cholelithiasis. There is a small amount of pericholecystic fluid. Will check HIDA. Hold on abx for now. Pt without RUQ pain.

## 2022-02-28 NOTE — Consult Note (Signed)
CARDIOLOGY CONSULT NOTE       Patient ID: [name] MRN: [id number] DOB/AGE: 03-09-1926 89 y.o.  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 admitted with acute on chronic renal failure elevated LFTls malaise , FTT , hallucinations Cr up to 3.23 Hct down to 23.7 and LFT;s  Up including transaminase and TB 3.2 He has a normally functioning pacemaker with ECG showing atrial pacing. He is post CABG with no angina  He had a 26 mm Sapien 3 valve placed on 02/06/22.  The valve is functioning normally by TTE with no PVL and mean gradient only 8.5 mmHg He appears volume depleted. 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 S/P stenting to mid RCA, prox PDA 06/1999. b. NSTEMI/CABG x 3 in 10/2011 with LIMA to LAD, SVG to PDA, and SVG to OM1.    Cephalalgia 07/14/2015   CHB (complete heart block) 05/03/2012   Overview:  Status post complete heart  block heart rate 28 bpm, alternating with 2 to one AV block and drug for bradycardia.  Status post pacemaker implantation.status post Medtronic pacemaker implantation the 03/28/2012   Chronic diastolic CHF (congestive heart failure) 02/05/2018   Chronic gouty arthritis    Chronic UTI    a. Followed by Dr. Risa Grill - colonized/asymptomatic - not on abx   CKD (chronic kidney disease)    stage 3, GFR 30-59 ml/min; stable with a creatinine around 1.9-2.0 followed by nephrology.   Constipation 03/01/2015   Symptoms and exam consistent with constipation. Abdominal exam is benign with no evidence of pain or obstruction. Discussed importance of increasing fiber and water intake coupled with physical activity to assist with bowel movements. Continue over-the-counter medication management as needed. Follow-up if symptoms worsen or fail to improve.   Coronary atherosclerosis 11/27/2011   Overview:  Multivessel coronary artery disease recently diagnosed by catheterization 2013 History of stent placement with a heparin-coated stent 2001  Last Assessment & Plan:  Status post coronary bypass grafting.  No recurrent chest pain. Overview:  Overview:  S/P stenting to mid RCA, prox PDA 06/1999;  06/2007 Myoview: negative except for apical thinning, EF 68%, last Myoview August 10, 2008 with n   Degeneration of lumbar intervertebral disc    Degenerative disc disease, cervical, with radiculopathy [id number]   Diastolic dysfunction [id number]   Grade 1 DD on Echo 06/2017   Dyslipidemia    Dysphagia 02/06/2016   3/18 -  DG Esophagus:  1. Mild esophageal dysmotility, likely presbyesophagus. 2. No other explanation for patient's symptoms. 3. Small hiatal hernia.  On swallow eval - evidence of cervical spine disease and that was likely contributing   Epistaxis 12/08/2020   Essential hypertension 07/20/2006   GERD (gastroesophageal reflux disease)    Gout 05/12/2018   Gouty arthritis of right great toe 09/03/2017   Left toe  Injected January 31, 2018    Hyperpigmentation 11/04/2017   Internal hemorrhoids 08/15/2016   Left inguinal hernia 07/01/2019   Lumbar post-laminectomy syndrome 12/12/2017   Moderate aortic regurgitation 07/04/2017   Echo 06/2017:  EF 55-60%, mild LVH, grade 1 DD, mild AS, mod AR, mild MR, mild-mod TR   Neck injury    a. 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  This was switched to by mouth amiodarone at the time of the thoracic surgery appointment was discontinued.   Presence of aortocoronary bypass graft 10/24/2011   Overview:  Performed at [facility] 2013 Last Assessment & Plan:  No complication post coronary bypass grafting.   Prostate cancer ([place])    a. 2001 s/p TURP.   Pulmonary nodule    a. felt to be noncancerous.  Status post followup CT scan 4 mm and stable.   Renal artery stenosis    a. 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 Burt Knack & Dr. 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   Trochanteric bursitis of hip, bilateral 01/10/2017   UGIB (upper gastrointestinal bleed) 02/01/2018   EGD  02/06/18 - mod, non-erosive gastritis   Venous insufficiency of leg 06/05/2010   Vitamin B12 deficiency 08/23/2009   Jan '14  July '14 B12 level  >1500    472   Weakness of both lower extremities 04/09/2020     Family History  Problem Relation Age of Onset   Coronary artery disease Father        died @ 81   Other Mother        cerebral hemorrhage - died @ 82   Arthritis Mother    Cancer Brother        Bladder   Prostate cancer Brother    Nephritis Brother        died @ age 64.   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  married '52 - 3 years, divorced; married '56 - 3 years divorced; married '42-12 yrs - divorced; married '75 -. 1 son '57; 1 daughter - '53; 1 grandchild.  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  Very happily married.  End of life care: yes CPR, no long term mechanical ventilation, no heroic measures.right handed   Right handed   Social Determinants of Health   Financial Resource Strain: Low Risk  (07/28/2021)   Overall Financial Resource Strain (CARDIA)    Difficulty of Paying Living Expenses: Not hard at all  Food Insecurity: No Food Insecurity (02/28/2022)   Hunger Vital Sign    Worried About Running Out of Food in the Last Year: Never true    Ran Out of Food in the  Last Year: Never true  Transportation Needs: No Transportation Needs (02/28/2022)   PRAPARE - Hydrologist (Medical): No    Lack of Transportation (Non-Medical): No  Physical Activity: Insufficiently Active (07/28/2021)   Exercise Vital Sign    Days of Exercise per Week: 2 days    Minutes of Exercise per Session: 20 min  Stress: No Stress Concern Present (07/28/2021)   [place]    Feeling of Stress : Not at all  Social Connections: [place] (07/28/2021)   Social Connection and Isolation Panel [NHANES]    Frequency of Communication with Friends and Family: Three times a week    Frequency of Social Gatherings with Friends and Family: Three times a week    Attends Religious Services: More than 4 times per year    Active Member of Clubs or Organizations: Yes    Attends Archivist Meetings: 1 to 4 times per year    Marital Status: Married  Human resources officer Violence: Not At Risk (02/28/2022)   Humiliation, Afraid, Rape, and Kick questionnaire    Fear of Current or Ex-Partner: No    Emotionally Abused: No    Physically Abused: No    Sexually Abused: No    Past Surgical History:  Procedure Laterality Date   ANTERIOR CHAMBER WASHOUT Left 10/04/2018   Procedure: Anterior Chamber Washout, Vitreous Tap;  Surgeon: Jalene Mullet, MD;  Location: [place];  Service: Ophthalmology;  Laterality: Left;   cardia catherization  07-07-99   CARDIAC SURGERY  10/18/12   open heart surgery   CATARACT EXTRACTION W/ INTRAOCULAR LENS  IMPLANT, BILATERAL  3/205, 06/2013   [place]   COLONOSCOPY  04/12/07   CORONARY ARTERY BYPASS GRAFT  10/19/2011   Procedure: CORONARY ARTERY BYPASS GRAFTING (CABG);  Surgeon: Gaye Pollack, MD;  Location: [place];  Service: Open Heart Surgery;  Laterality: N/A;  times three using Left Internal Mammary Artery and Right Greater Saphenouse Vein Graft harvested Endoscopically    edg  07-17-1994  FLEXIBLE SIGMOIDOSCOPY  11-03-1997   GAS INSERTION Left 10/04/2018   Procedure: Insertion Of Gas;  Surgeon: Jalene Mullet, MD;  Location: [place];  Service: Ophthalmology;  Laterality: Left;   GAS/FLUID EXCHANGE Left 10/04/2018   Procedure: Gas/Fluid Exchange;  Surgeon: Jalene Mullet, MD;  Location: [place];  Service: Ophthalmology;  Laterality: Left;   INTRAOPERATIVE TRANSTHORACIC ECHOCARDIOGRAM N/A 02/06/2022   Procedure: INTRAOPERATIVE TRANSTHORACIC ECHOCARDIOGRAM;  Surgeon: [name], MD;  Location: [place] [facility];  Service: Open Heart Surgery;  Laterality: N/A;   LEFT HEART CATHETERIZATION WITH CORONARY ANGIOGRAM N/A 10/16/2011   Procedure: LEFT HEART CATHETERIZATION WITH CORONARY ANGIOGRAM;  Surgeon: Raven Furnas M Martinique, MD;  Location: [facility] CATH LAB;  Service: Cardiovascular;  Laterality: N/A;   LEFT HEART CATHETERIZATION WITH CORONARY/GRAFT ANGIOGRAM N/A 03/11/2013   Procedure: LEFT HEART CATHETERIZATION WITH [name];  Surgeon: Blane Ohara, MD;  Location: [facility] CATH LAB;  Service: Cardiovascular;  Laterality: N/A;   lumbar spinal disk and neck fusion surgery     PACEMAKER INSERTION  03/28/12   PPM implanted for mobitz II AV block   PARS PLANA VITRECTOMY Left 10/04/2018   Procedure: PARS PLANA VITRECTOMY 25 GAUGE FOR ENDOPHTHALMITIS WITH INJECTION OF INTRAVITREAL ANTIBIOTIC;  Surgeon: Jalene Mullet, MD;  Location: [place];  Service: Ophthalmology;  Laterality: Left;   peripheral vascular catherization  11-24-03   PERMANENT PACEMAKER INSERTION N/A 03/28/2012   Procedure: PERMANENT PACEMAKER INSERTION;  Surgeon: Thompson Grayer, MD;  Location: [facility] CATH LAB;  Service: Cardiovascular;  Laterality: N/A;   PROSTATECTOMY     renal circulation  10-01-03   s/p ptca     stents     X 2   stress cardiolite  05-04-05   spring 09-negative except for apical thinning, EF 68%   TRANSCATHETER AORTIC VALVE REPLACEMENT, TRANSFEMORAL Right 02/06/2022   Procedure: Transcatheter  Aortic Valve Replacement, Transfemoral;  Surgeon: [name], MD;  Location: [place] [facility];  Service: Open Heart Surgery;  Laterality: Right;      Current Facility-Administered Medications:    acetaminophen (TYLENOL) tablet 650 mg, 650 mg, Oral, Q6H PRN **OR** acetaminophen (TYLENOL) suppository 650 mg, 650 mg, Rectal, Q6H PRN, Kristopher Oppenheim, DO   bumetanide (BUMEX) injection 1 mg, 1 mg, Intravenous, Q12H, Kristopher Oppenheim, DO, 1 mg at 02/28/22 1354   heparin injection 5,000 Units, 5,000 Units, Subcutaneous, Q8H, Kristopher Oppenheim, DO, 5,000 Units at 02/28/22 1303   ondansetron (ZOFRAN) tablet 4 mg, 4 mg, Oral, Q6H PRN **OR** ondansetron (ZOFRAN) injection 4 mg, 4 mg, Intravenous, Q6H PRN, Kristopher Oppenheim, DO  bumetanide (BUMEX) IV  1 mg Intravenous Q12H   heparin  5,000 Units Subcutaneous Q8H     Physical Exam: Blood pressure (!) 114/53, pulse 64, temperature 98 F (36.7 C), temperature source Oral, resp. rate 18, height 5' 10"$  (1.778 m), weight 70.8 kg, SpO2 96 %.    Elderly male Lucid now with poor memory Lungs clear SEM through TAVR valve no AR Post sternotomy PPM under left clavicle  Trace pedal edema Peripheral neuropathy Abdomen no acute   Labs:   Lab Results  Component Value Date   WBC 5.8 02/28/2022   HGB 8.2 (L) 02/28/2022   HCT 23.7 (L) 02/28/2022   MCV 72.5 (L) 02/28/2022   PLT 236 02/28/2022    Recent Labs  Lab 02/28/22 0353  NA 131*  K 3.5  CL 104  CO2 18*  BUN 68*  CREATININE 3.23*  CALCIUM 9.3  PROT 5.8*  BILITOT 2.3*  ALKPHOS 453*  ALT 174*  AST 162*  GLUCOSE 93   Lab Results  Component Value Date   CKTOTAL 127 12/23/2018   CKMB 0.8 08/22/2007   TROPONINI <0.30 03/27/2012    Lab Results  Component Value Date   CHOL 122 09/04/2016   CHOL 136 09/01/2015   CHOL 108 08/31/2014   Lab Results  Component Value Date   HDL 40.40 09/04/2016   HDL 41.40 09/01/2015   HDL 32.70 (L) 08/31/2014   Lab Results  Component Value Date   LDLCALC 70  09/04/2016   LDLCALC 70 09/01/2015   LDLCALC 56 08/31/2014   Lab Results  Component Value Date   TRIG 59.0 09/04/2016   TRIG 124.0 09/01/2015   TRIG 99.0 08/31/2014   Lab Results  Component Value Date   CHOLHDL 3 09/04/2016   CHOLHDL 3 09/01/2015   CHOLHDL 3 08/31/2014   No results found for: "LDLDIRECT"    Radiology: NM Hepato W/EF  Result Date: 02/28/2022 CLINICAL DATA:  Elevated liver function tests EXAM: NUCLEAR MEDICINE HEPATOBILIARY IMAGING WITH GALLBLADDER EF TECHNIQUE: Sequential images of the abdomen were obtained out to 60 minutes following intravenous administration of radiopharmaceutical. After oral ingestion of Ensure, gallbladder ejection fraction was determined. At 60 min, normal ejection fraction is greater than 33%. RADIOPHARMACEUTICALS:  7.8 mCi Tc-80m Choletec IV COMPARISON:  CT 02/27/2022 and ultrasound FINDINGS: Prompt uptake and biliary excretion of activity by the liver is seen. Gallbladder activity is visualized, consistent with patency of cystic duct. Biliary activity passes into small bowel, consistent with patent common bile duct. Calculated gallbladder ejection fraction is 36%. (Normal gallbladder ejection fraction with Ensure is greater than 33%.) IMPRESSION: No common duct or cystic duct obstruction. Low normal gallbladder ejection fraction Electronically Signed   By: AJill SideM.D.   On: 02/28/2022 10:42   CT ABDOMEN PELVIS WO CONTRAST  Result Date: 02/27/2022 CLINICAL DATA:  Abnormal lab results right upper quadrant pain EXAM: CT ABDOMEN AND PELVIS WITHOUT CONTRAST TECHNIQUE: Multidetector CT imaging of the abdomen and pelvis was performed following the standard protocol without IV contrast. RADIATION DOSE REDUCTION: This exam was performed according to the departmental dose-optimization program which includes automated exposure control, adjustment of the mA and/or kV according to patient size and/or use of iterative reconstruction technique. COMPARISON:   Ultrasound 02/27/2022, CT 01/10/2022, PET CT 12/21/2021 FINDINGS: Lower chest: Lung bases demonstrate small left and moderate right pleural effusions. Dependent atelectasis at the bases. Coronary vascular calcifications. Partially visualized intracardiac pacing leads. Interval TAVR. Cardiomegaly with trace pericardial effusion. Hepatobiliary: No focal hepatic abnormality or biliary dilatation. No calcified stone. Slightly distended gallbladder with diffuse gallbladder wall thickening and possible pericholecystic fluid. Pancreas: Unremarkable. No pancreatic ductal dilatation or surrounding inflammatory changes. Spleen: Borderline enlarged. Adrenals/Urinary Tract: Adrenal glands are normal. Kidneys show no hydronephrosis. Bilateral renal cysts for which no imaging follow-up is recommended. Hyperdense subcentimeter cortical lesions in the kidneys measuring up to 9 mm too small to further characterize. Small right kidney. The bladder is unremarkable Stomach/Bowel: The stomach is nonenlarged. There is no dilated small bowel. No acute bowel wall thickening. Vascular/Lymphatic: Moderate aortic atherosclerosis. No aneurysm. No suspicious lymph nodes. Reproductive: Prostatectomy. Stable soft tissue thickening at the right aspect of the prostatectomy bed measuring 2 x 1.5 cm. Other: No free air. Small volume free fluid in the abdomen and pelvis. Generalized subcutaneous edema consistent with anasarca. Metallic foreign body at the anterior pelvis with artifact. Musculoskeletal: No acute or suspicious osseous abnormality. IMPRESSION: 1. Slightly distended gallbladder with  diffuse gallbladder wall thickening and possible pericholecystic fluid. Reference previously same day right upper quadrant ultrasound. 2. Cardiomegaly with small left and moderate right pleural effusions. Small volume free fluid in the abdomen and pelvis. Generalized subcutaneous edema consistent with anasarca. 3. Aortic atherosclerosis. Aortic Atherosclerosis  (ICD10-I70.0). Electronically Signed   By: Donavan Foil M.D.   On: 02/27/2022 23:19   US Abdomen Limited RUQ (LIVER/GB)  Result Date: 02/27/2022 CLINICAL DATA:  Abdominal pain. EXAM: ULTRASOUND ABDOMEN LIMITED RIGHT UPPER QUADRANT COMPARISON:  CT of the chest abdomen pelvis dated 01/10/2022. FINDINGS: Gallbladder: There is no gallstone. The gallbladder wall is thickened measuring 4 mm. There is pericholecystic fluid. Negative sonographic Murphy's sign. Common bile duct: Diameter: 4 mm Liver: The liver is unremarkable. Portal vein is patent on color Doppler imaging with normal direction of blood flow towards the liver. Other: There is CAD partially visualized right pleural effusion. A 4.5 cm right renal upper pole cyst. IMPRESSION: 1. No gallstone. Thickened gallbladder wall with small pericholecystic fluid of indeterminate etiology. Clinical correlation is recommended. A hepatobiliary scintigraphy may provide better evaluation of the gallbladder if there is a high clinical concern for acute cholecystitis . 2. Right pleural effusion. Electronically Signed   By: Anner Crete M.D.   On: 02/27/2022 21:41   VAS [place] UPPER EXTREMITY VENOUS DUPLEX  Result Date: 02/23/2022 UPPER VENOUS STUDY  Patient Name:  BURLIN GODKIN  Date of Exam:   02/22/2022 Medical Rec #: [id number]           Accession #:    [id number] Date of Birth: Sep 01, 1926           Patient Gender: M Patient Age:   61 years Exam Location:  [facility] Procedure:      VAS [place] UPPER EXTREMITY VENOUS DUPLEX Referring Phys: Vance Gather --------------------------------------------------------------------------------  Indications: Pain, and Swelling Risk Factors: Cancer Lung. Comparison Study: No prior study. Performing Technologist: Candie Chroman  Examination Guidelines: A complete evaluation includes B-mode imaging, spectral Doppler, color Doppler, and power Doppler as needed of all accessible portions of each vessel. Bilateral testing is  considered an integral part of a complete examination. Limited examinations for reoccurring indications may be performed as noted.  Right Findings: +----------+------------+---------+-----------+----------+-------+ RIGHT     CompressiblePhasicitySpontaneousPropertiesSummary +----------+------------+---------+-----------+----------+-------+ Subclavian    Full       Yes       Yes                      +----------+------------+---------+-----------+----------+-------+  Left Findings: +----------+------------+---------+-----------+----------+-------+ LEFT      CompressiblePhasicitySpontaneousPropertiesSummary +----------+------------+---------+-----------+----------+-------+ IJV           Full       Yes       Yes                      +----------+------------+---------+-----------+----------+-------+ Subclavian    Full       Yes       Yes                      +----------+------------+---------+-----------+----------+-------+ Axillary      Full       Yes       Yes                      +----------+------------+---------+-----------+----------+-------+ Brachial      Full       Yes       Yes                      +----------+------------+---------+-----------+----------+-------+  Radial        Full                                          +----------+------------+---------+-----------+----------+-------+ Ulnar         Full                                          +----------+------------+---------+-----------+----------+-------+ Cephalic      Full                                          +----------+------------+---------+-----------+----------+-------+ Basilic       None                                   Acute  +----------+------------+---------+-----------+----------+-------+  Summary:  Right: No evidence of thrombosis in the subclavian.  Left: No evidence of deep vein thrombosis in the upper extremity. Findings consistent with acute superficial vein  thrombosis involving the left basilic vein.  *See table(s) above for measurements and observations.  Diagnosing physician: Deitra Mayo MD Electronically signed by Deitra Mayo MD on 02/23/2022 at 10:34:28 AM.    Final    ECHOCARDIOGRAM COMPLETE  Result Date: 02/22/2022    ECHOCARDIOGRAM REPORT   Patient Name:   LYFE FITZKE Date of Exam: 02/22/2022 Medical Rec #:  [id number]          Height:       70.0 in Accession #:    [id number]         Weight:       173.9 lb Date of Birth:  [birth year]          BSA:          1.967 m Patient Age:    51 years           BP:           127/61 mmHg Patient Gender: M                  HR:           69 bpm. Exam Location:  Inpatient Procedure: 2D Echo, 3D Echo, Cardiac Doppler and Color Doppler Indications:    Fever  History:        Patient has prior history of Echocardiogram examinations, most                 recent 02/07/2022. CHF, Abnormal ECG, Aortic Valve Disease,                 Arrythmias:Bradycardia and Ventricular Fibrillation; Risk                 Factors:Hypertension and Dyslipidemia.                 Aortic Valve: 26 mm Sapien prosthetic, stented (TAVR) valve is                 present in the aortic position. Procedure Date: 02/06/2022.  Sonographer:    Roseanna Rainbow RDCS Referring Phys: [id number] [place]  1. Poor acoustic windows limit study Difficlt to see endocardium  in all views.. Left ventricular ejection fraction, by estimation, is 50 to 55%. The left ventricle has low normal function. The left ventricle has no regional wall motion abnormalities. There is severe asymmetric left ventricular hypertrophy.  2. Right ventricular systolic function is normal. The right ventricular size is normal. There is moderately elevated pulmonary artery systolic pressure.  3. Left atrial size was moderately dilated.  4. Right atrial size was moderately dilated.  5. Mild mitral valve regurgitation. Moderate mitral annular calcification.  6. S/p TAVR (26 mm  Sapien prosthesis, procedure date 02/06/22). Peak and mean gradients through the vavle are 15 and 8 mm Hg respectively. NO signficant change from echo in 1.30. 2024.. The aortic valve has been repaired/replaced. Aortic valve regurgitation is not visualized. There is a 26 mm Sapien prosthetic (TAVR) valve present in the aortic position. Procedure Date: 02/06/2022.  7. Aortic dilatation noted. There is mild dilatation of the ascending aorta, measuring 39 mm.  8. The inferior vena cava is dilated in size with <50% respiratory variability, suggesting right atrial pressure of 15 mmHg. FINDINGS  Left Ventricle: Poor acoustic windows limit study Difficlt to see endocardium in all views. Left ventricular ejection fraction, by estimation, is 50 to 55%. The left ventricle has low normal function. The left ventricle has no regional wall motion abnormalities. The left ventricular internal cavity size was normal in size. There is severe asymmetric left ventricular hypertrophy. Right Ventricle: The right ventricular size is normal. Right vetricular wall thickness was not assessed. Right ventricular systolic function is normal. There is moderately elevated pulmonary artery systolic pressure. The tricuspid regurgitant velocity is  2.91 m/s, and with an assumed right atrial pressure of 15 mmHg, the estimated right ventricular systolic pressure is [id number] mmHg. Left Atrium: Left atrial size was moderately dilated. Right Atrium: Right atrial size was moderately dilated. Pericardium: There is no evidence of pericardial effusion. Mitral Valve: There is mild thickening of the mitral valve leaflet(s). Moderate mitral annular calcification. Mild mitral valve regurgitation. Tricuspid Valve: The tricuspid valve is normal in structure. Tricuspid valve regurgitation is mild. Aortic Valve: S/p TAVR (26 mm Sapien prosthesis, procedure date 02/06/22). Peak and mean gradients through the vavle are 15 and 8 mm Hg respectively. NO signficant change from  echo in 1.30. 2024. The aortic valve has been repaired/replaced. Aortic valve regurgitation is not visualized. Aortic valve mean gradient measures 7.5 mmHg. Aortic valve peak gradient measures 14.9 mmHg. Aortic valve area, by VTI measures 3.00 cm. There is a 26 mm Sapien prosthetic, stented (TAVR) valve present in the aortic position. Procedure Date: 02/06/2022. Pulmonic Valve: The pulmonic valve was normal in structure. Pulmonic valve regurgitation is not visualized. Aorta: The aortic root is normal in size and structure and aortic dilatation noted. There is mild dilatation of the ascending aorta, measuring 39 mm. Venous: The inferior vena cava is dilated in size with less than 50% respiratory variability, suggesting right atrial pressure of 15 mmHg. IAS/Shunts: No atrial level shunt detected by color flow Doppler.  LEFT VENTRICLE PLAX 2D LVIDd:         4.65 cm      Diastology LVIDs:         3.80 cm      LV e' medial:   9.03 cm/s LV PW:         1.38 cm      LV E/e' medial: 14.6 LV IVS:        1.48 cm LVOT diam:     2.60 cm LV  SV:         113 LV SV Index:   57 LVOT Area:     5.31 cm     3D Volume EF:                             3D EF:        42 %                             LV EDV:       189 ml LV Volumes (MOD)            LV ESV:       109 ml LV vol d, MOD A2C: 121.0 ml LV SV:        80 ml LV vol d, MOD A4C: 94.4 ml LV vol s, MOD A2C: 57.2 ml LV vol s, MOD A4C: 43.1 ml LV SV MOD A2C:     63.8 ml LV SV MOD A4C:     94.4 ml LV SV MOD BP:      55.6 ml RIGHT VENTRICLE            IVC RV S prime:     9.14 cm/s  IVC diam: 2.50 cm TAPSE (M-mode): 1.2 cm LEFT ATRIUM             Index        RIGHT ATRIUM           Index LA diam:        5.10 cm 2.59 cm/m   RA Area:     26.40 cm LA Vol (A2C):   81.8 ml 41.58 ml/m  RA Volume:   90.10 ml  45.80 ml/m LA Vol (A4C):   82.9 ml 42.14 ml/m LA Biplane Vol: 85.3 ml 43.36 ml/m  AORTIC VALVE AV Area (Vmax):    3.05 cm AV Area (Vmean):   3.13 cm AV Area (VTI):     3.00 cm AV Vmax:            193.00 cm/s AV Vmean:          125.500 cm/s AV VTI:            0.375 m AV Peak Grad:      14.9 mmHg AV Mean Grad:      7.5 mmHg LVOT Vmax:         111.00 cm/s LVOT Vmean:        73.900 cm/s LVOT VTI:          0.212 m LVOT/AV VTI ratio: 0.57  AORTA Ao Root diam: 3.30 cm Ao Asc diam:  3.90 cm MITRAL VALVE                TRICUSPID VALVE MV Area (PHT): 4.26 cm     TR Peak grad:   33.9 mmHg MV Decel Time: 178 msec     TR Vmax:        291.00 cm/s MV E velocity: 131.50 cm/s MV A velocity: 107.00 cm/s  SHUNTS MV E/A ratio:  1.23         Systemic VTI:  0.21 m                             Systemic Diam: 2.60 cm Dorris Carnes MD Electronically signed by Dorris Carnes MD Signature Date/Time: 02/22/2022/3:18:48 PM  Final    DG Chest Port 1 View  Result Date: 02/21/2022 CLINICAL DATA:  Possible sepsis lethargy fever EXAM: PORTABLE CHEST 1 VIEW COMPARISON:  02/13/2022 FINDINGS: Left-sided dual lead pacing device as before. Valve prosthesis. Status post sternotomy. No focal airspace disease. Probable small pleural effusions. Stable cardiomediastinal silhouette with aortic atherosclerosis. IMPRESSION: Probable small pleural effusions. No focal airspace disease. Electronically Signed   By: Donavan Foil M.D.   On: 02/21/2022 17:23   DG Chest 2 View  Result Date: 02/13/2022 CLINICAL DATA:  Tremors.  Status post valve replacement. EXAM: CHEST - 2 VIEW COMPARISON:  Chest radiograph dated 02/02/2022. FINDINGS: There is blunting of the posterior costophrenic angles on the lateral view consistent with small pleural effusions. There is associated atelectasis of the lung bases. No pneumothorax. Mild cardiomegaly. Median sternotomy wires, CABG vascular clips, and aortic valve with placement. Left pectoral pacemaker device. No acute osseous pathology. Degenerative changes of the spine. IMPRESSION: Small bilateral pleural effusions with associated atelectasis. Electronically Signed   By: Anner Crete M.D.   On: 02/13/2022 17:44    ECHOCARDIOGRAM COMPLETE  Result Date: 02/08/2022    ECHOCARDIOGRAM REPORT   Patient Name:   QUANTAVIUS REISINGER Date of Exam: 02/07/2022 Medical Rec #:  [id number]          Height:       70.0 in Accession #:    [id number]         Weight:       157.6 lb Date of Birth:  01-Nov-1926          BSA:          1.886 m Patient Age:    64 years           BP:           158/71 mmHg Patient Gender: M                  HR:           70 bpm. Exam Location:  Inpatient Procedure: 2D Echo, Cardiac Doppler and Color Doppler Indications:    Post TAVR evaluation V43.3 / Z95.2  History:        Patient has prior history of Echocardiogram examinations, most                 recent 02/06/2022. CHF, CAD, Pacemaker, Aortic Valve Disease,                 Arrythmias:Atrial Fibrillation and Bradycardia; Risk                 Factors:GERD, Dyslipidemia and Hypertension.                 Aortic Valve: 26 mm Sapien prosthetic, stented (TAVR) valve is                 present in the aortic position. Procedure Date: 02/06/2022.  Sonographer:    Bernadene Person RDCS Referring Phys: [id number] [place]  1. The aortic valve has been repaired/replaced. Aortic valve regurgitation is not visualized. There is a 26 mm Sapien prosthetic (TAVR) valve present in the aortic position. Procedure Date: 02/06/2022. Echo findings are consistent with normal structure and function of the aortic valve prosthesis. Aortic valve area, by VTI measures 2.58 cm. Aortic valve mean gradient measures 7.3 mmHg. Aortic valve Vmax measures 1.84 m/s.  2. Left ventricular ejection fraction, by estimation, is 55 to 60%. The left ventricle has normal function. The  left ventricle has no regional wall motion abnormalities. There is mild concentric left ventricular hypertrophy. Left ventricular diastolic parameters are consistent with Grade II diastolic dysfunction (pseudonormalization).  3. Right ventricular systolic function is normal. The right ventricular size is normal.  There is mildly elevated pulmonary artery systolic pressure. The estimated right ventricular systolic pressure is [id number] mmHg.  4. Left atrial size was severely dilated.  5. The mitral valve is grossly normal. Mild to moderate mitral valve regurgitation. No evidence of mitral stenosis.  6. The inferior vena cava is dilated in size with >50% respiratory variability, suggesting right atrial pressure of 8 mmHg. FINDINGS  Left Ventricle: Left ventricular ejection fraction, by estimation, is 55 to 60%. The left ventricle has normal function. The left ventricle has no regional wall motion abnormalities. The left ventricular internal cavity size was normal in size. There is  mild concentric left ventricular hypertrophy. Left ventricular diastolic parameters are consistent with Grade II diastolic dysfunction (pseudonormalization). Right Ventricle: The right ventricular size is normal. No increase in right ventricular wall thickness. Right ventricular systolic function is normal. There is mildly elevated pulmonary artery systolic pressure. The tricuspid regurgitant velocity is 2.88  m/s, and with an assumed right atrial pressure of 8 mmHg, the estimated right ventricular systolic pressure is [id number] mmHg. Left Atrium: Left atrial size was severely dilated. Right Atrium: Right atrial size was normal in size. Pericardium: There is no evidence of pericardial effusion. Mitral Valve: The mitral valve is grossly normal. Mild mitral annular calcification. Mild to moderate mitral valve regurgitation. No evidence of mitral valve stenosis. Tricuspid Valve: The tricuspid valve is grossly normal. Tricuspid valve regurgitation is mild . No evidence of tricuspid stenosis. Aortic Valve: The aortic valve has been repaired/replaced. Aortic valve regurgitation is not visualized. Aortic valve mean gradient measures 7.3 mmHg. Aortic valve peak gradient measures 13.5 mmHg. Aortic valve area, by VTI measures 2.58 cm. There is a 26 mm Sapien  prosthetic, stented (TAVR) valve present in the aortic position. Procedure Date: 02/06/2022. Echo findings are consistent with normal structure and function of the aortic valve prosthesis. Pulmonic Valve: The pulmonic valve was grossly normal. Pulmonic valve regurgitation is trivial. No evidence of pulmonic stenosis. Aorta: The aortic root and ascending aorta are structurally normal, with no evidence of dilitation. Venous: The inferior vena cava is dilated in size with greater than 50% respiratory variability, suggesting right atrial pressure of 8 mmHg. IAS/Shunts: The atrial septum is grossly normal. Additional Comments: A device lead is visualized in the right atrium and right ventricle.  LEFT VENTRICLE PLAX 2D LVIDd:         4.70 cm      Diastology LVIDs:         3.40 cm      LV e' medial:    6.45 cm/s LV PW:         1.10 cm      LV E/e' medial:  15.4 LV IVS:        1.10 cm      LV e' lateral:   9.29 cm/s LVOT diam:     2.26 cm      LV E/e' lateral: 10.7 LV SV:         98 LV SV Index:   52 LVOT Area:     4.01 cm  LV Volumes (MOD) LV vol d, MOD A2C: 149.0 ml LV vol d, MOD A4C: 156.0 ml LV vol s, MOD A2C: 65.8 ml LV vol s, MOD A4C: 57.7 ml  LV SV MOD A2C:     83.2 ml LV SV MOD A4C:     156.0 ml LV SV MOD BP:      92.4 ml RIGHT VENTRICLE RV S prime:     8.32 cm/s TAPSE (M-mode): 1.6 cm LEFT ATRIUM             Index        RIGHT ATRIUM           Index LA diam:        4.90 cm 2.60 cm/m   RA Area:     19.00 cm LA Vol (A2C):   96.5 ml 51.15 ml/m  RA Volume:   48.30 ml  25.60 ml/m LA Vol (A4C):   91.7 ml 48.61 ml/m LA Biplane Vol: 99.9 ml 52.96 ml/m  AORTIC VALVE AV Area (Vmax):    2.65 cm AV Area (Vmean):   2.59 cm AV Area (VTI):     2.58 cm AV Vmax:           184.00 cm/s AV Vmean:          124.667 cm/s AV VTI:            0.379 m AV Peak Grad:      13.5 mmHg AV Mean Grad:      7.3 mmHg LVOT Vmax:         121.50 cm/s LVOT Vmean:        80.450 cm/s LVOT VTI:          0.244 m LVOT/AV VTI ratio: 0.64  AORTA Ao Root  diam: 3.60 cm Ao Asc diam:  3.60 cm MITRAL VALVE                  TRICUSPID VALVE MV Area (PHT): 3.53 cm       TR Peak grad:   33.2 mmHg MV Decel Time: 215 msec       TR Vmax:        288.00 cm/s MR Peak grad:    133.6 mmHg MR Mean grad:    80.0 mmHg    SHUNTS MR Vmax:         578.00 cm/s  Systemic VTI:  0.24 m MR Vmean:        423.0 cm/s   Systemic Diam: 2.26 cm MR PISA:         1.01 cm MR PISA Eff ROA: 7 mm MR PISA Radius:  0.40 cm MV E velocity: 99.20 cm/s MV A velocity: 111.00 cm/s MV E/A ratio:  0.89 Eleonore Chiquito MD Electronically signed by Eleonore Chiquito MD Signature Date/Time: 02/08/2022/10:27:22 AM    Final    ECHOCARDIOGRAM LIMITED  Result Date: 02/06/2022    ECHOCARDIOGRAM LIMITED REPORT   Patient Name:   MAMOUDOU DIGIROLAMO Date of Exam: 02/06/2022 Medical Rec #:  [id number]          Height:       70.0 in Accession #:    [id number]         Weight:       154.0 lb Date of Birth:  1926/09/16          BSA:          1.868 m Patient Age:    49 years           BP:           167/91 mmHg Patient Gender: M  HR:           68 bpm. Exam Location:  Inpatient Procedure: Limited Echo, Limited Color Doppler and Cardiac Doppler Indications:     Aortic Stenosis  History:         Patient has prior history of Echocardiogram examinations, most                  recent 12/18/2021. CHF, Previous Myocardial Infarction and CAD,                  Pacemaker; Risk Factors:GERD, Hypertension and Dyslipidemia.                  Aortic Valve: 26 mm Sapien prosthetic, stented (TAVR) valve is                  present in the aortic position. Procedure Date: 02/06/2022.  Sonographer:     Bernadene Person RDCS Referring Phys:  [place] Diagnosing Phys: Eleonore Chiquito MD  Sonographer Comments: 19m Edwards Sapien aortic valve placed. IMPRESSIONS  1. 26 mm S3 TAVR. Vmax 1 m/s, MG 2.0 mmHG, EOA 3.64 cm2, DI 0.82. No regurgitation or paravalvular leak. The aortic valve has been repaired/replaced. Aortic valve regurgitation  is not visualized. No aortic stenosis is present. There is a 26 mm Sapien prosthetic (TAVR) valve present in the aortic position. Procedure Date: 02/06/2022.  2. Left ventricular ejection fraction, by estimation, is 55 to 60%. The left ventricle has normal function. The left ventricle has no regional wall motion abnormalities.  3. Right ventricular systolic function is normal. The right ventricular size is normal.  4. The mitral valve is degenerative. Mild mitral valve regurgitation. No evidence of mitral stenosis.  5. Tricuspid valve regurgitation is mild to moderate. FINDINGS  Left Ventricle: Left ventricular ejection fraction, by estimation, is 55 to 60%. The left ventricle has normal function. The left ventricle has no regional wall motion abnormalities. Right Ventricle: The right ventricular size is normal. No increase in right ventricular wall thickness. Right ventricular systolic function is normal. Pericardium: There is no evidence of pericardial effusion. Mitral Valve: The mitral valve is degenerative in appearance. Mild mitral valve regurgitation. No evidence of mitral valve stenosis. Tricuspid Valve: The tricuspid valve is grossly normal. Tricuspid valve regurgitation is mild to moderate. No evidence of tricuspid stenosis. Aortic Valve: 26 mm S3 TAVR. Vmax 1 m/s, MG 2.0 mmHG, EOA 3.64 cm2, DI 0.82. No regurgitation or paravalvular leak. The aortic valve has been repaired/replaced. Aortic valve regurgitation is not visualized. Aortic regurgitation PHT measures 433 msec. No aortic stenosis is present. Aortic valve mean gradient measures 2.0 mmHg. Aortic valve peak gradient measures 3.9 mmHg. Aortic valve area, by VTI measures 3.64 cm. There is a 26 mm Sapien prosthetic, stented (TAVR) valve present in the aortic position. Procedure Date: 02/06/2022. Pulmonic Valve: The pulmonic valve was grossly normal. Pulmonic valve regurgitation is trivial. No evidence of pulmonic stenosis. Additional Comments: Spectral  Doppler performed. Color Doppler performed.  LEFT VENTRICLE PLAX 2D LVOT diam:     2.38 cm LV SV:         89 LV SV Index:   47 LVOT Area:     4.45 cm  AORTIC VALVE AV Area (Vmax):    4.53 cm AV Area (Vmean):   1.63 cm AV Area (VTI):     3.64 cm AV Vmax:           98.20 cm/s AV Vmean:  169.150 cm/s AV VTI:            0.243 m AV Peak Grad:      3.9 mmHg AV Mean Grad:      2.0 mmHg LVOT Vmax:         100.00 cm/s LVOT Vmean:        61.800 cm/s LVOT VTI:          0.199 m LVOT/AV VTI ratio: 0.82 AI PHT:            433 msec TRICUSPID VALVE TR Peak grad:   48.2 mmHg TR Vmax:        347.00 cm/s  SHUNTS Systemic VTI:  0.20 m Systemic Diam: 2.38 cm Eleonore Chiquito MD Electronically signed by Eleonore Chiquito MD Signature Date/Time: 02/06/2022/12:40:49 PM    Final    Structural Heart Procedure  Result Date: 02/06/2022 See surgical note for result.  DG Chest 2 View  Result Date: 02/04/2022 CLINICAL DATA:  Preop for aortic valve replacement. EXAM: CHEST - 2 VIEW COMPARISON:  May 23, 2020. FINDINGS: The heart size and mediastinal contours are within normal limits. Status post coronary bypass graft. Left-sided pacemaker is unchanged in position. Small bilateral pleural effusions are noted. No definite consolidative process is noted. The visualized skeletal structures are unremarkable. IMPRESSION: Small bilateral pleural effusions. No definite consolidative process is noted. Electronically Signed   By: Marijo Conception M.D.   On: 02/04/2022 20:33    EKG: A pacing    ASSESSMENT AND PLAN:   TAVR:  implant on 1/30 with 26 mm Sapien 3 valve Normal function no signs of SBE or PVL normal gradient CABG:  no angina ECG stable Abnormal Labs:  no cardiac etiology noted for elevated LFt;s and acute on chronic renal failure His EF is 50-55% with normal RV function No evidence of passive hepatic congestion or hepato renal syndrome   W/u for anemia, A/CRF, elevated LFTls per primary service nephrology and GI  Note  patient is DNR   Discussed with family and pastor   Signed: Jenkins Rouge 02/28/2022, 4:40 PM

## 2022-02-28 NOTE — Assessment & Plan Note (Signed)
Pt has been on lasix for about 12 months. Wife states pt not urinating as much with lasix. Will change to bumex to see if this increases his diuresis.

## 2022-02-28 NOTE — Hospital Course (Signed)
89 year old male history of diastolic heart failure, hypertension, CKD stage IV, recent TAVR for severe aortic stenosis on February 06, 2022, history of second-degree AV block status post pacemaker, prior prostate cancer status post prostatectomy who presents to the ER today from the PCP office secondary to worsening renal failure and elevated LFTs.

## 2022-02-28 NOTE — Assessment & Plan Note (Signed)
Stable. Had echo 02-22-2022 that showed stable TAVR and LVEF 55%.

## 2022-02-28 NOTE — Assessment & Plan Note (Signed)
Hold hydralazine and norvasc during diuresis. Norvasc could be a cause of his LE edema.

## 2022-02-28 NOTE — H&P (Signed)
History and Physical    [name] [id number] DOB: 1926/05/22 DOA: 02/27/2022  DOS: the patient was seen and examined on 02/27/2022  PCP: [name], MD   Patient coming from: Home  I have personally briefly reviewed patient's old medical records in [place]  CC: worsening Scr and elevated LFTs HPI: 89 year old male history of diastolic heart failure, hypertension, CKD stage IV, recent TAVR for severe aortic stenosis on February 06, 2022, history of second-degree AV block status post pacemaker, prior prostate cancer status post prostatectomy who presents to the ER today from the PCP office secondary to worsening renal failure and elevated LFTs.  Patient was admitted to the hospital on 02/21/2022 due to worsening renal failure.  At that time he also had a fever.  His workup was essentially negative.  He received some IV fluids and his serum creatinine continue to improve.  He was discharged to home.  Since his initial TAVR, patient has been progressively weaker.  He has been seen multiple times by primary care and cardiology without etiology to his continued weakness.  Wife states that patient has recently started hallucinating. He was seen in the primary care office on 02/27/2022. Patient's been having visual hallucinations.  Wife states the patient has not been eating much.  She also states the patient has been having increasing lower extremity edema.  Patient's Norvasc dose was increased while he was in the hospital.  On arrival to the ER, temperature 98 heart rate 69 blood pressure 140/58 satting 98% on room air.  Labs showed a white count of 6.8, hemoglobin 8.7, platelets of 261  Sodium 132, potassium 3.8, bicarb of 15, BUN of 65, creatinine 3.29  Total protein 6.3, albumin 3.3, AST 175, ALT of 185, alk phos of 437, total bili 2.6  Lipase was elevated at 1076  BNP elevated at 490  Ammonia normal at 13  Magnesium 2.4  Ultrasound the right upper quadrant  demonstrated thickened gallbladder wall measuring 4 mm.  There is pericholecystic fluid.  Sonographic Murphy sign was negative.  Common bile duct diameter was 4 mm.  CT scan abdomen pelvis demonstrated a right pleural effusion.  Slightly distended gallbladder with gallbladder wall thickening. He has a stable 2 x 1.5 cm soft tissue thickening at the right aspect of the prostatectomy bed.  Tried hospitalist contacted for admission.    ED Course: RUQ U/S shows gallbladder wall thickening. No gall stones. NH3 normal. Worsening Scr, elevated LFTs.  Review of Systems:  Review of Systems  Constitutional:  Positive for malaise/fatigue.  HENT: Negative.    Eyes: Negative.   Respiratory:  Positive for shortness of breath.        SOB with activity  Cardiovascular:  Positive for leg swelling.  Gastrointestinal: Negative.   Genitourinary: Negative.   Musculoskeletal: Negative.   Skin: Negative.   Neurological:        Bilateral neuropathic/radicular pain in both legs from hips to feet. Episodic in nature.  Endo/Heme/Allergies:        Anorexia  Psychiatric/Behavioral:  Positive for hallucinations.   All other systems reviewed and are negative.   Past Medical History:  Diagnosis Date   Anemia    AV block, 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 S/P stenting to mid RCA, prox PDA 06/1999. b. NSTEMI/CABG x 3 in 10/2011 with LIMA to LAD, SVG to PDA, and SVG to OM1.  Cephalalgia 07/14/2015   CHB (complete heart block) 05/03/2012   Overview:  Status post complete heart block heart rate 28 bpm, alternating with 2 to one AV block and drug for bradycardia.  Status post pacemaker implantation.status post Medtronic pacemaker implantation the 03/28/2012   Chronic diastolic CHF (congestive heart failure) 02/05/2018   Chronic gouty arthritis    Chronic UTI    a. Followed by Dr. Risa Grill - colonized/asymptomatic - not on  abx   CKD (chronic kidney disease)    stage 3, GFR 30-59 ml/min; stable with a creatinine around 1.9-2.0 followed by nephrology.   Constipation 03/01/2015   Symptoms and exam consistent with constipation. Abdominal exam is benign with no evidence of pain or obstruction. Discussed importance of increasing fiber and water intake coupled with physical activity to assist with bowel movements. Continue over-the-counter medication management as needed. Follow-up if symptoms worsen or fail to improve.   Coronary atherosclerosis 11/27/2011   Overview:  Multivessel coronary artery disease recently diagnosed by catheterization 2013 History of stent placement with a heparin-coated stent 2001  Last Assessment & Plan:  Status post coronary bypass grafting.  No recurrent chest pain. Overview:  Overview:  S/P stenting to mid RCA, prox PDA 06/1999;  06/2007 Myoview: negative except for apical thinning, EF 68%, last Myoview August 10, 2008 with n   Degeneration of lumbar intervertebral disc    Degenerative disc disease, cervical, with radiculopathy [id number]   Diastolic dysfunction [id number]   Grade 1 DD on Echo 06/2017   Dyslipidemia    Dysphagia 02/06/2016   3/18 - DG Esophagus:  1. Mild esophageal dysmotility, likely presbyesophagus. 2. No other explanation for patient's symptoms. 3. Small hiatal hernia.  On swallow eval - evidence of cervical spine disease and that was likely contributing   Epistaxis 12/08/2020   Essential hypertension 07/20/2006   GERD (gastroesophageal reflux disease)    Gout 05/12/2018   Gouty arthritis of right great toe 09/03/2017   Left toe Injected January 31, 2018    Hyperpigmentation 11/04/2017   Internal hemorrhoids 08/15/2016   Left inguinal hernia 07/01/2019   Lumbar post-laminectomy syndrome 12/12/2017   Moderate aortic regurgitation 07/04/2017   Echo 06/2017:  EF 55-60%, mild LVH, grade 1 DD, mild AS, mod AR, mild MR, mild-mod TR   Neck injury    a. 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  This was switched to by mouth amiodarone at the time of the thoracic surgery appointment was discontinued.   Presence of aortocoronary bypass graft 10/24/2011   Overview:  Performed at [facility] 2013 Last Assessment & Plan:  No complication post coronary bypass grafting.   Prostate cancer ([place])    a. 2001 s/p TURP.   Pulmonary nodule    a. felt to be noncancerous.  Status post followup CT scan 4 mm and stable.   Renal artery stenosis    a. 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 Burt Knack & Dr. 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   Trochanteric bursitis of hip, bilateral 01/10/2017   UGIB (upper gastrointestinal bleed) 02/01/2018   EGD  02/06/18 - mod, non-erosive gastritis   Venous insufficiency of leg 06/05/2010   Vitamin B12 deficiency 08/23/2009   Jan '14  July '14 B12 level  >1500    472   Weakness of both lower extremities 04/09/2020    Past Surgical History:  Procedure Laterality Date   ANTERIOR CHAMBER WASHOUT Left 10/04/2018   Procedure: Anterior Chamber Washout, Vitreous Tap;  Surgeon: Jalene Mullet, MD;  Location: [place];  Service: Ophthalmology;  Laterality: Left;   cardia catherization  07-07-99   CARDIAC SURGERY  10/18/12   open heart surgery   CATARACT EXTRACTION W/  INTRAOCULAR LENS  IMPLANT, BILATERAL  3/205, 06/2013   [facility]   COLONOSCOPY  04/12/07   CORONARY ARTERY BYPASS GRAFT  10/19/2011   Procedure: CORONARY ARTERY BYPASS GRAFTING (CABG);  Surgeon: Gaye Pollack, MD;  Location: [place];  Service: Open Heart Surgery;  Laterality: N/A;  times three using Left Internal Mammary Artery and Right Greater S[place]hen[place] Vein Graft harvested Endoscopically   edg  07-17-1994   FLEXIBLE SIGMOIDOSCOPY  11-03-1997   GAS INSERTION Left 10/04/2018   Procedure: Insertion Of Gas;  Surgeon: Jalene Mullet, MD;  Location: [place];  Service: Ophthalmology;  Laterality: Left;   GAS/FLUID EXCHANGE Left 10/04/2018   Procedure: Gas/Fluid Exchange;  Surgeon: Jalene Mullet, MD;  Location: [place];  Service: Ophthalmology;  Laterality: Left;   INTRAOPERATIVE TRANSTHORACIC ECHOCARDIOGRAM N/A 02/06/2022   Procedure: INTRAOPERATIVE TRANSTHORACIC ECHOCARDIOGRAM;  Surgeon: [name], MD;  Location: [place] [facility];  Service: Open Heart Surgery;  Laterality: N/A;   LEFT HEART CATHETERIZATION WITH CORONARY ANGIOGRAM N/A 10/16/2011   Procedure: LEFT HEART CATHETERIZATION WITH CORONARY ANGIOGRAM;  Surgeon: Peter M Martinique, MD;  Location: [facility] CATH LAB;  Service: Cardiovascular;  Laterality: N/A;   LEFT HEART CATHETERIZATION WITH CORONARY/GRAFT ANGIOGRAM N/A 03/11/2013   Procedure: LEFT HEART CATHETERIZATION WITH [name];  Surgeon: Blane Ohara, MD;  Location: [facility] CATH LAB;  Service: Cardiovascular;  Laterality: N/A;   lumbar spinal disk and neck fusion surgery     PACEMAKER INSERTION  03/28/12   PPM implanted for mobitz II AV block   PARS PLANA VITRECTOMY Left 10/04/2018   Procedure: PARS PLANA VITRECTOMY 25 GAUGE FOR ENDOPHTHALMITIS WITH INJECTION OF INTRAVITREAL ANTIBIOTIC;  Surgeon: Jalene Mullet, MD;  Location: [place];  Service: Ophthalmology;  Laterality: Left;   peripheral vascular catherization  11-24-03   PERMANENT PACEMAKER INSERTION N/A 03/28/2012   Procedure:  PERMANENT PACEMAKER INSERTION;  Surgeon: Thompson Grayer, MD;  Location: [facility] C[facility] LAB;  Service: Cardiovascular;  Laterality: N/A;   PROSTATECTOMY     renal circulation  10-01-03   s/p ptca     stents     X 2   stress cardiolite  05-04-05   spring 09-negative except for apical thinning, EF 68%   TRANSCATHETER AORTIC VALVE REPLACEMENT, TRANSFEMORAL Right 02/06/2022   Procedure: Transcatheter Aortic Valve Replacement, Transfemoral;  Surgeon: [name], MD;  Location: [place] [facility];  Service: Open Heart Surgery;  Laterality: Right;     reports that he has never smoked. He has never used smokeless tobacco. He reports that he does not currently use alcohol. He reports that he does not use drugs.  Allergies  Allergen Reactions  Aspirin Nausea And Vomiting and Other (See Comments)    Upset stomach- tolerates coated aspirin    Lisinopril Anaphylaxis and Shortness Of Breath    After 3 tablets, he experienced trouble swallowing, throat tightness and hoarseness.    Amoxicillin Nausea Only   Atarax [Hydroxyzine Hcl] Nausea And Vomiting   Cephalexin Diarrhea   Ciprofloxacin Nausea Only   Clindamycin Other (See Comments)    Gi upset    Clobetasol Other (See Comments)    Not effective   Codeine Nausea Only and Other (See Comments)    Stomach upset   Fish Allergy Nausea And Vomiting   Fluarix [Influenza Virus Vaccine] Itching   Haemophilus Influenzae Itching   Hydrocodone Nausea Only   Hydrocodone-Acetaminophen Nausea And Vomiting   Hydroxyzine Nausea And Vomiting   Latex Itching and Other (See Comments)    (After flu shot)   Macrobid [Nitrofurantoin Macrocrystal] Nausea Only   Niacin Other (See Comments)    Unknown reaction   Niacin-Lovastatin Er Other (See Comments)    Unsure of reaction. Taking simvastatin at home without problems   Niacin-Lovastatin Er Other (See Comments)    Reaction not recalled   Nitrofurantoin Other (See Comments)    Upset stomach    Omeprazole Other  (See Comments)    Reaction not recalled- stopped by MD   Other Nausea And Vomiting   Tramadol Other (See Comments)    Unknown    Vibramycin [Doxycycline] Other (See Comments)    Reaction not recalled   Adhesive [Tape] Rash   Bactrim [Sulfamethoxazole-Trimethoprim] Rash   Colchicine Rash   Gabapentin Other (See Comments)    Painful pimples on tongue   Nortriptyline Rash    Family History  Problem Relation Age of Onset   Coronary artery disease Father        died @ 38   Other Mother        cerebral hemorrhage - died @ 85   Arthritis Mother    Cancer Brother        Bladder   Prostate cancer Brother    Nephritis Brother        died @ age 17.   Other Brother        cerebral hemorrhage - died @ 61   Heart disease Brother    Breast cancer Other        niece x 2   Diabetes Neg Hx    Colon cancer Neg Hx    Adrenal disorder Neg Hx     Prior to Admission medications   Medication Sig Start Date End Date Taking? Authorizing Provider  acetaminophen (TYLENOL) 500 MG tablet Take 1,000 mg by mouth 3 (three) times daily as needed for moderate pain.   Yes [provider]  allopurinol (ZYLOPRIM) 100 MG tablet TAKE 1 TABLET(100 MG) BY MOUTH DAILY Patient taking differently: Take 100 mg by mouth daily. TAKE 1 TABLET(100 MG) BY MOUTH DAILY 12/04/21  Yes Burns, Claudina Lick, MD  amLODipine (NORVASC) 10 MG tablet Take 1 tablet (10 mg total) by mouth daily. 02/07/22  Yes Eileen Stanford, PA-C  amoxicillin (AMOXIL) 500 MG tablet TAKE 4 TABLETS (2000 MG) BY MOUTH 1 HOUR PRIOR TO DENTAL APPOINTMENTS AND PROCEDURES 02/14/22  Yes Kathyrn Drown D, NP  Aromatic Inhalants (VICKS VAPOR INHALER IN) Place 1 puff into both nostrils as needed (for congestion).   Yes [provider]  Ascorbic Acid (VITAMIN C) 1000 MG tablet Take 1,000 mg by mouth daily.   Yes [provider]  aspirin EC 81 MG tablet Take 81 mg by mouth daily.   Yes [provider]  Calcium Citrate-Vitamin D  (CITRACAL + D PO) Take 2 tablets by mouth in the morning and at bedtime.   Yes [provider]  Carboxymeth-Glycerin-Polysorb (REFRESH OPTIVE MEGA-3 OP) Place 1 drop into both eyes 2 (two) times daily.   Yes [provider]  Cholecalciferol 25 MCG (1000 UT) capsule Take 1,000 Units by mouth daily.   Yes [provider]  cyanocobalamin (,VITAMIN B-12,) 1000 MCG/ML injection Inject 1,000 mcg into the muscle once. Monthly injection   Yes [provider]  folic acid (FOLVITE) 1 MG tablet Take 1 tablet (1 mg total) by mouth daily. Annual appt due in May must see provider for future refills Patient taking differently: Take 1 mg by mouth at bedtime. Annual appt due in May must see provider for future refills 12/04/21  Yes Burns, Claudina Lick, MD  furosemide (LASIX) 40 MG tablet TAKE 1 TABLET BY MOUTH EVERY DAY Patient taking differently: Take 40 mg by mouth as needed for fluid or edema. 04/05/21  Yes Burns, Claudina Lick, MD  hydrALAZINE (APRESOLINE) 50 MG tablet Take 1 tablet (50 mg total) by mouth 3 (three) times daily. 02/08/22  Yes Eileen Stanford, PA-C  loratadine (CLARITIN) 10 MG tablet Take 10 mg by mouth daily as needed (for seasonal allergies).   Yes [provider]  Multiple Vitamins-Minerals (PRESERVISION AREDS 2 PO) Take 1 tablet by mouth in the morning and at bedtime.   Yes [provider]  nitroGLYCERIN (NITROSTAT) 0.4 MG SL tablet DISSOLVE 1 TABLET UNDER THE TONGUE EVERY 5 MINUTES AS NEEDED FOR CHEST PAIN, MAXIMUM 3 TABLETS Patient taking differently: Place 0.4 mg under the tongue every 5 (five) minutes as needed for chest pain. 04/30/19  Yes Burns, Claudina Lick, MD  Saline (AYR NASAL MIST ALLERGY/SINUS NA) Place 2 sprays into the nose as needed (dryness).   Yes [provider]  trolamine salicylate (ASPERCREME) 10 % cream Apply 1 Application topically in the morning and at bedtime. For leg cramps   Yes [provider]  Vitamin D,  Ergocalciferol, (DRISDOL) 1.25 MG (50000 UNIT) CAPS capsule Take 50,000 Units by mouth every 30 (thirty) days. 07/13/21  Yes [provider]    Physical Exam: Vitals:   02/28/22 0130 02/28/22 0200 02/28/22 0450 02/28/22 0456  BP: (!) 137/58 136/61 134/78   Pulse: 63 69 66   Resp: 15 18 19   $ Temp:    [id number] F (37 C)  TempSrc:      SpO2: 98% 97% 99%   Weight:      Height:        Physical Exam Vitals and nursing note reviewed.  Constitutional:      General: He is not in acute distress.    Appearance: He is not toxic-appearing or diaphoretic.  HENT:     Head: Normocephalic and atraumatic.     Nose: Nose normal.  Cardiovascular:     Rate and Rhythm: Normal rate and regular rhythm.     Pulses: Normal pulses.     Heart sounds: Murmur heard.  Pulmonary:     Effort: Pulmonary effort is normal.     Breath sounds: No wheezing.  Abdominal:     General: Abdomen is flat. Bowel sounds are normal. There is no distension.     Palpations: Abdomen is soft.     Tenderness: There is no abdominal tenderness.  Musculoskeletal:  Right lower leg: 1+ Edema present.     Left lower leg: 1+ Edema present.     Right foot: Swelling present.     Left foot: Swelling present.     Comments: +2-3 pitting bilateral pedal edema  +1 bilateral flank edema  Skin:    General: Skin is warm and dry.     Capillary Refill: Capillary refill takes less than 2 seconds.  Neurological:     General: No focal deficit present.     Mental Status: He is disoriented.      Labs on Admission: I have personally reviewed following labs and imaging studies  CBC: Recent Labs  Lab 02/21/22 1707 02/22/22 0322 02/27/22 1401 02/27/22 1958 02/28/22 0353  WBC 4.4 3.3* 9.1 6.8 5.8  NEUTROABS 4.0  --   --  5.4 4.4  HGB 9.4* 8.9* 9.2* 8.7* 8.2*  HCT 29.4* 26.9* 28.0* 25.3* 23.7*  MCV 80.1 78.0* 76.2* 74.0* 72.5*  PLT 119* 103* 253.0 261 [id number]   Basic Metabolic Panel: Recent Labs  Lab 02/21/22 1707  02/22/22 0322 02/27/22 1401 02/27/22 1958 02/28/22 0353  NA 136 136 133* 132* 131*  K 3.8 3.6 3.8 3.8 3.5  CL 106 107 104 104 104  CO2 19* 17* 18* 15* 18*  GLUCOSE 137* 98 93 111* 93  BUN 39* 38* 61* 65* 68*  CREATININE 2.57* 2.34* 3.03* 3.29* 3.23*  CALCIUM 9.4 8.4* 10.0 9.6 9.3  MG  --   --   --   --  2.4   GFR: Estimated Creatinine Clearance: 14.3 mL/min (A) (by C-G formula based on SCr of 3.23 mg/dL (H)). Liver Function Tests: Recent Labs  Lab 02/21/22 1707 02/27/22 1401 02/27/22 1958 02/28/22 0353  AST 43* 174* 175* 162*  ALT 21 186* 185* 174*  ALKPHOS 58 474* 437* 453*  BILITOT 0.7 3.2* 2.6* 2.3*  PROT 6.8 6.5 6.3* 5.8*  ALBUMIN 3.8 3.7 3.3* 3.0*   Recent Labs  Lab 02/27/22 1958  LIPASE 1,076*   Recent Labs  Lab 02/28/22 0353  AMMONIA 13   Coagulation Profile: Recent Labs  Lab 02/21/22 1707  INR 1.2   Cardiac Enzymes: No results for input(s): "CKTOTAL", "CKMB", "CKMBINDEX", "TROPONINI", "TROPONINIHS" in the last 168 hours. BNP (last 3 results) No results for input(s): "PROBNP" in the last 8760 hours. HbA1C: No results for input(s): "HGBA1C" in the last 72 hours. CBG: No results for input(s): "GLUCAP" in the last 168 hours. Lipid Profile: No results for input(s): "CHOL", "HDL", "LDLCALC", "TRIG", "CHOLHDL", "LDLDIRECT" in the last 72 hours. Thyroid Function Tests: No results for input(s): "TSH", "T4TOTAL", "FREET4", "T3FREE", "THYROIDAB" in the last 72 hours. Anemia Panel: No results for input(s): "VITAMINB12", "FOLATE", "FERRITIN", "TIBC", "IRON", "RETICCTPCT" in the last 72 hours. Urine analysis:    Component Value Date/Time   COLORURINE YELLOW 02/21/2022 2045   APPEARANCEUR CLEAR 02/21/2022 2045   LABSPEC 1.016 02/21/2022 2045   PHURINE 5.0 02/21/2022 2045   GLUCOSEU NEGATIVE 02/21/2022 2045   GLUCOSEU NEGATIVE 09/03/2017 1352   HGBUR NEGATIVE 02/21/2022 2045   BILIRUBINUR NEGATIVE 02/21/2022 2045   BILIRUBINUR Negative 09/10/2013 0000    KETONESUR 5 (A) 02/21/2022 2045   PROTEINUR 100 (A) 02/21/2022 2045   UROBILINOGEN 0.2 09/03/2017 1352   NITRITE NEGATIVE 02/21/2022 2045   LEUKOCYTESUR NEGATIVE 02/21/2022 2045    Radiological Exams on Admission: I have personally reviewed images CT ABDOMEN PELVIS WO CONTRAST  Result Date: 02/27/2022 CLINICAL DATA:  Abnormal lab results right upper quadrant pain EXAM: CT ABDOMEN  AND PELVIS WITHOUT CONTRAST TECHNIQUE: Multidetector CT imaging of the abdomen and pelvis was performed following the standard protocol without IV contrast. RADIATION DOSE REDUCTION: This exam was performed according to the departmental dose-optimization program which includes automated exposure control, adjustment of the mA and/or kV according to patient size and/or use of iterative reconstruction technique. COMPARISON:  Ultrasound 02/27/2022, CT 01/10/2022, PET CT 12/21/2021 FINDINGS: Lower chest: Lung bases demonstrate small left and moderate right pleural effusions. Dependent atelectasis at the bases. Coronary vascular calcifications. Partially visualized intracardiac pacing leads. Interval TAVR. Cardiomegaly with trace pericardial effusion. Hepatobiliary: No focal hepatic abnormality or biliary dilatation. No calcified stone. Slightly distended gallbladder with diffuse gallbladder wall thickening and possible pericholecystic fluid. Pancreas: Unremarkable. No pancreatic ductal dilatation or surrounding inflammatory changes. Spleen: Borderline enlarged. Adrenals/Urinary Tract: Adrenal glands are normal. Kidneys show no hydronephrosis. Bilateral renal cysts for which no imaging follow-up is recommended. Hyperdense subcentimeter cortical lesions in the kidneys measuring up to 9 mm too small to further characterize. Small right kidney. The bladder is unremarkable Stomach/Bowel: The stomach is nonenlarged. There is no dilated small bowel. No acute bowel wall thickening. Vascular/Lymphatic: Moderate aortic atherosclerosis. No  aneurysm. No suspicious lymph nodes. Reproductive: Prostatectomy. Stable soft tissue thickening at the right aspect of the prostatectomy bed measuring 2 x 1.5 cm. Other: No free air. Small volume free fluid in the abdomen and pelvis. Generalized subcutaneous edema consistent with anasarca. Metallic foreign body at the anterior pelvis with artifact. Musculoskeletal: No acute or suspicious osseous abnormality. IMPRESSION: 1. Slightly distended gallbladder with diffuse gallbladder wall thickening and possible pericholecystic fluid. Reference previously same day right upper quadrant ultrasound. 2. Cardiomegaly with small left and moderate right pleural effusions. Small volume free fluid in the abdomen and pelvis. Generalized subcutaneous edema consistent with anasarca. 3. Aortic atherosclerosis. Aortic Atherosclerosis (ICD10-I70.0). Electronically Signed   By: Donavan Foil M.D.   On: 02/27/2022 23:19   US Abdomen Limited RUQ (LIVER/GB)  Result Date: 02/27/2022 CLINICAL DATA:  Abdominal pain. EXAM: ULTRASOUND ABDOMEN LIMITED RIGHT UPPER QUADRANT COMPARISON:  CT of the chest abdomen pelvis dated 01/10/2022. FINDINGS: Gallbladder: There is no gallstone. The gallbladder wall is thickened measuring 4 mm. There is pericholecystic fluid. Negative sonographic Murphy's sign. Common bile duct: Diameter: 4 mm Liver: The liver is unremarkable. Portal vein is patent on color Doppler imaging with normal direction of blood flow towards the liver. Other: There is CAD partially visualized right pleural effusion. A 4.5 cm right renal upper pole cyst. IMPRESSION: 1. No gallstone. Thickened gallbladder wall with small pericholecystic fluid of indeterminate etiology. Clinical correlation is recommended. A hepatobiliary scintigraphy may provide better evaluation of the gallbladder if there is a high clinical concern for acute cholecystitis . 2. Right pleural effusion. Electronically Signed   By: Anner Crete M.D.   On: 02/27/2022  21:41    EKG: My personal interpretation of EKG shows: paced rhythm    Assessment/Plan Principal Problem:   Acute renal failure superimposed on stage 4 chronic kidney disease ([place]) Active Problems:   AV block, 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   Anasarca   Elevated LFTs   Acute urinary retention   Essential hypertension   S/P TAVR (transcatheter aortic valve replacement)   (HFpEF) heart failure with preserved ejection fraction (HCC)   DNR (do not resuscitate)/DNI(Do Not Intubate)   Pleural effusion on right    Assessment and Plan: * Acute renal failure superimposed on stage 4 chronic kidney disease ([place]) Observation med/surg bed. Pt with acute urinary  retention. Will place foley catheter. Worsening Scr. Pt has soft tissue mass near prostatectomy bed. PET/CT in 12-2021 could not see any activity due to intense radiotracer activity next to bladder. Pt voided 120 ml. Post-void bladder scan was 301. Will have RN document actual post-void residual amount after foley placed.  May need therapy for hepatorenal syndrome(I.e. IV albumin) if placing foley does not improve his Scr.  Pt without any hydronephrosis on CT.   Acute urinary retention Will place foley catheter. Pt is s/p prostatectomy. Will check PSA.  Elevated LFTs Pt with elevated LFTs. No pain. CT and RUQ show thickened gallbladder wall without cholelithiasis. There is a small amount of pericholecystic fluid. Will check HIDA. Hold on abx for now. Pt without RUQ pain.  Anasarca Unclear what his anasarca is from. Pt's appetite is quite poor and his serum protein and albumin have been dropping since his TAVR. Could be related to poor protein stores. He may need albumin augmented diuresis.  Also, his norvasc was increased during last hospital admission. Will stop norvasc. Check prealbumin.  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  Pleural effusion on right Continue diuresis. May need thoracentesis if diuresis  fails to resolve pleural effusion.  DNR (do not resuscitate)/DNI(Do Not Intubate) Verified with pt and wife that he is a DNR/DNI.  (HFpEF) heart failure with preserved ejection fraction (HCC) Pt has been on lasix for about 12 months. Wife states pt not urinating as much with lasix. Will change to bumex to see if this increases his diuresis.  S/P TAVR (transcatheter aortic valve replacement) Stable. Had echo 02-22-2022 that showed stable TAVR and LVEF 55%.  Essential hypertension Hold hydralazine and norvasc during diuresis. Norvasc could be a cause of his LE edema.   DVT prophylaxis: SQ Heparin Code Status: DNR/DNI(Do NOT Intubate). Verified with pt and wife at bedside Family Communication: discussed with pt and wife at bedside  Disposition Plan: return home  Consults called: none  Admission status: Observation, Med-Surg   Kristopher Oppenheim, DO [facility] 02/28/2022, 5:14 AM

## 2022-02-28 NOTE — Subjective & Objective (Signed)
CC: worsening Scr and elevated LFTs HPI: 89 year old male history of diastolic heart failure, hypertension, CKD stage IV, recent TAVR for severe aortic stenosis on February 06, 2022, history of second-degree AV block status post pacemaker, prior prostate cancer status post prostatectomy who presents to the ER today from the PCP office secondary to worsening renal failure and elevated LFTs.  Patient was admitted to the hospital on 02/21/2022 due to worsening renal failure.  At that time he also had a fever.  His workup was essentially negative.  He received some IV fluids and his serum creatinine continue to improve.  He was discharged to home.  Since his initial TAVR, patient has been progressively weaker.  He has been seen multiple times by primary care and cardiology without etiology to his continued weakness.  Wife states that patient has recently started hallucinating. He was seen in the primary care office on 02/27/2022. Patient's been having visual hallucinations.  Wife states the patient has not been eating much.  She also states the patient has been having increasing lower extremity edema.  Patient's Norvasc dose was increased while he was in the hospital.  On arrival to the ER, temperature 98 heart rate 69 blood pressure 140/58 satting 98% on room air.  Labs showed a white count of 6.8, hemoglobin 8.7, platelets of 261  Sodium 132, potassium 3.8, bicarb of 15, BUN of 65, creatinine 3.29  Total protein 6.3, albumin 3.3, AST 175, ALT of 185, alk phos of 437, total bili 2.6  Lipase was elevated at 1076  BNP elevated at 490  Ammonia normal at 13  Magnesium 2.4  Ultrasound the right upper quadrant demonstrated thickened gallbladder wall measuring 4 mm.  There is pericholecystic fluid.  Sonographic Murphy sign was negative.  Common bile duct diameter was 4 mm.  CT scan abdomen pelvis demonstrated a right pleural effusion.  Slightly distended gallbladder with gallbladder wall  thickening. He has a stable 2 x 1.5 cm soft tissue thickening at the right aspect of the prostatectomy bed.  Tried hospitalist contacted for admission.

## 2022-02-28 NOTE — ED Notes (Signed)
Pt transported to nuc med

## 2022-02-28 NOTE — Assessment & Plan Note (Signed)
Chronic. 

## 2022-02-28 NOTE — Assessment & Plan Note (Signed)
Continue diuresis. May need thoracentesis if diuresis fails to resolve pleural effusion.

## 2022-02-28 NOTE — Assessment & Plan Note (Addendum)
Unclear what his anasarca is from. Pt's appetite is quite poor and his serum protein and albumin have been dropping since his TAVR. Could be related to poor protein stores. He may need albumin augmented diuresis.  Also, his norvasc was increased during last hospital admission. Will stop norvasc. Check prealbumin.

## 2022-02-28 NOTE — Assessment & Plan Note (Signed)
Verified with pt and wife that he is a DNR/DNI.

## 2022-02-28 NOTE — Assessment & Plan Note (Signed)
Observation med/surg bed. Pt with acute urinary retention. Will place foley catheter. Worsening Scr. Pt has soft tissue mass near prostatectomy bed. PET/CT in 12-2021 could not see any activity due to intense radiotracer activity next to bladder. Pt voided 120 ml. Post-void bladder scan was 301. Will have RN document actual post-void residual amount after foley placed.  May need therapy for hepatorenal syndrome(I.e. IV albumin) if placing foley does not improve his Scr.  Pt without any hydronephrosis on CT.

## 2022-02-28 NOTE — ED Notes (Signed)
Pt appears to be comfortable and resting, can observe even RR that are unlabored, pt remains on cardiac monitoring devices, no changes noted, side rails up x2 for safety, NAD noted, plan of care ongoing, call light within reach, no further concerns as of present.  Wife at the bedside

## 2022-02-28 NOTE — Progress Notes (Signed)
   575 ml post-void residual after foley catheter placed.  Kristopher Oppenheim, DO [facility]

## 2022-02-28 NOTE — Assessment & Plan Note (Signed)
Will place foley catheter. Pt is s/p prostatectomy. Will check PSA.

## 2022-03-01 ENCOUNTER — Inpatient Hospital Stay (HOSPITAL_COMMUNITY): Payer: Medicare Other

## 2022-03-01 ENCOUNTER — Ambulatory Visit: Payer: Medicare Other

## 2022-03-01 DIAGNOSIS — Z91013 Allergy to seafood: Secondary | ICD-10-CM | POA: Diagnosis not present

## 2022-03-01 DIAGNOSIS — I130 Hypertensive heart and chronic kidney disease with heart failure and stage 1 through stage 4 chronic kidney disease, or unspecified chronic kidney disease: Secondary | ICD-10-CM | POA: Diagnosis present

## 2022-03-01 DIAGNOSIS — Z951 Presence of aortocoronary bypass graft: Secondary | ICD-10-CM | POA: Diagnosis not present

## 2022-03-01 DIAGNOSIS — R441 Visual hallucinations: Secondary | ICD-10-CM | POA: Diagnosis present

## 2022-03-01 DIAGNOSIS — Z885 Allergy status to narcotic agent status: Secondary | ICD-10-CM | POA: Diagnosis not present

## 2022-03-01 DIAGNOSIS — Z8546 Personal history of malignant neoplasm of prostate: Secondary | ICD-10-CM | POA: Diagnosis not present

## 2022-03-01 DIAGNOSIS — Z950 Presence of cardiac pacemaker: Secondary | ICD-10-CM | POA: Diagnosis not present

## 2022-03-01 DIAGNOSIS — Z66 Do not resuscitate: Secondary | ICD-10-CM | POA: Diagnosis present

## 2022-03-01 DIAGNOSIS — R627 Adult failure to thrive: Secondary | ICD-10-CM | POA: Diagnosis present

## 2022-03-01 DIAGNOSIS — Z881 Allergy status to other antibiotic agents status: Secondary | ICD-10-CM | POA: Diagnosis not present

## 2022-03-01 DIAGNOSIS — Z880 Allergy status to penicillin: Secondary | ICD-10-CM | POA: Diagnosis not present

## 2022-03-01 DIAGNOSIS — Z882 Allergy status to sulfonamides status: Secondary | ICD-10-CM | POA: Diagnosis not present

## 2022-03-01 DIAGNOSIS — Z91040 Latex allergy status: Secondary | ICD-10-CM | POA: Diagnosis not present

## 2022-03-01 DIAGNOSIS — Z952 Presence of prosthetic heart valve: Secondary | ICD-10-CM | POA: Diagnosis not present

## 2022-03-01 DIAGNOSIS — Z91048 Other nonmedicinal substance allergy status: Secondary | ICD-10-CM | POA: Diagnosis not present

## 2022-03-01 DIAGNOSIS — E872 Acidosis, unspecified: Secondary | ICD-10-CM | POA: Diagnosis present

## 2022-03-01 DIAGNOSIS — R601 Generalized edema: Secondary | ICD-10-CM | POA: Diagnosis present

## 2022-03-01 DIAGNOSIS — I5032 Chronic diastolic (congestive) heart failure: Secondary | ICD-10-CM | POA: Diagnosis present

## 2022-03-01 DIAGNOSIS — N184 Chronic kidney disease, stage 4 (severe): Secondary | ICD-10-CM | POA: Diagnosis present

## 2022-03-01 DIAGNOSIS — R339 Retention of urine, unspecified: Secondary | ICD-10-CM | POA: Diagnosis present

## 2022-03-01 DIAGNOSIS — D509 Iron deficiency anemia, unspecified: Secondary | ICD-10-CM | POA: Diagnosis present

## 2022-03-01 DIAGNOSIS — N179 Acute kidney failure, unspecified: Secondary | ICD-10-CM | POA: Diagnosis present

## 2022-03-01 DIAGNOSIS — Z886 Allergy status to analgesic agent status: Secondary | ICD-10-CM | POA: Diagnosis not present

## 2022-03-01 DIAGNOSIS — E785 Hyperlipidemia, unspecified: Secondary | ICD-10-CM | POA: Diagnosis present

## 2022-03-01 DIAGNOSIS — I509 Heart failure, unspecified: Secondary | ICD-10-CM | POA: Diagnosis not present

## 2022-03-01 DIAGNOSIS — Z888 Allergy status to other drugs, medicaments and biological substances status: Secondary | ICD-10-CM | POA: Diagnosis not present

## 2022-03-01 DIAGNOSIS — R7989 Other specified abnormal findings of blood chemistry: Secondary | ICD-10-CM | POA: Diagnosis not present

## 2022-03-01 LAB — COMPREHENSIVE METABOLIC PANEL
ALT: 146 U/L — ABNORMAL HIGH (ref 0–44)
AST: 114 U/L — ABNORMAL HIGH (ref 15–41)
Albumin: 2.9 g/dL — ABNORMAL LOW (ref 3.5–5.0)
Alkaline Phosphatase: 491 U/L — ABNORMAL HIGH (ref 38–126)
Anion gap: 9 (ref 5–15)
BUN: 67 mg/dL — ABNORMAL HIGH (ref 8–23)
CO2: 19 mmol/L — ABNORMAL LOW (ref 22–32)
Calcium: 9 mg/dL (ref 8.9–10.3)
Chloride: 106 mmol/L (ref 98–111)
Creatinine, Ser: 3.31 mg/dL — ABNORMAL HIGH (ref 0.61–1.24)
GFR, Estimated: 17 mL/min — ABNORMAL LOW (ref >60)
Glucose, Bld: 84 mg/dL (ref 70–99)
Potassium: 3.5 mmol/L (ref 3.5–5.1)
Sodium: 134 mmol/L — ABNORMAL LOW (ref 135–145)
Total Bilirubin: 1.8 mg/dL — ABNORMAL HIGH (ref 0.3–1.2)
Total Protein: 5.3 g/dL — ABNORMAL LOW (ref 6.5–8.1)

## 2022-03-01 LAB — CBC
HCT: 22.2 % — ABNORMAL LOW (ref 39.0–52.0)
Hemoglobin: 7.9 g/dL — ABNORMAL LOW (ref 13.0–17.0)
MCH: 25 pg — ABNORMAL LOW (ref 26.0–34.0)
MCHC: 35.6 g/dL (ref 30.0–36.0)
MCV: 70.3 fL — ABNORMAL LOW (ref 80.0–100.0)
Platelets: 205 10*3/uL (ref 150–400)
RBC: 3.16 MIL/uL — ABNORMAL LOW (ref 4.22–5.81)
RDW: 15.3 % (ref 11.5–15.5)
WBC: 6.2 10*3/uL (ref 4.0–10.5)
nRBC: 0 % (ref 0.0–0.2)

## 2022-03-01 LAB — IRON AND TIBC
Iron: 48 ug/dL (ref 45–182)
Saturation Ratios: 20 % (ref 17.9–39.5)
TIBC: 235 ug/dL — ABNORMAL LOW (ref 250–450)
UIBC: 187 ug/dL

## 2022-03-01 LAB — FERRITIN: Ferritin: 331 ng/mL (ref 24–336)

## 2022-03-01 LAB — LIPASE, BLOOD: Lipase: 785 U/L — ABNORMAL HIGH (ref 11–51)

## 2022-03-01 LAB — PSA: Prostatic Specific Antigen: 0.88 ng/mL (ref 0.00–4.00)

## 2022-03-01 NOTE — Consult Note (Signed)
Consultation  Referring Provider:  Dr. Wyline Copas     Primary Care Physician:  [name], MD Primary Gastroenterologist: [facility] here)        Reason for Consultation:   Elevated LFTs and lipase         HPI:   [name] is a 89 y.o. male with a past medical history as listed below including diastolic heart failure, hypertension, CKD stage IV, recent TAVR for severe aortic stenosis on 02/06/2022, history of second-degree AV block status post pacemaker, prior prostate cancer status post prostatectomy who presented to the ER on 02/27/2022 from his PCP for worsening renal failure and elevated LFTs.    Recent admission to the hospital 02/21/2022 due to worsening renal failure, at that time also had a fever, workup negative, received some IV fluids and his creatinine improved.  Discharged home.    Since TAVR patient has been progressively weaker and seen multiple times by primary care and cardiology without etiology for continued weakness.  At time of presentation wife stated the patient had recently started hallucinating.  Apparently seen by PCP 2/20 and was having visual hallucinations and not eating as much also noted to have increasing lower extremity edema.  His Norvasc was increased while he was in the hospital.     Today, patient is seen with close family friends by the bedside, one who is a Marine scientist and his wife.  He is very histrionic and it is hard to get him to stay on topic.  In fact, most of his history is really garnered from the family members in the room.  They explain that about 2 weeks ago he had an episode of chills while sitting in the chair, than last week he spent 2 days laying around in the bed and had not urinated, they brought him to the hospital as above for worsening renal failure, apparently had a fever at that time as well they did a full workup and his creatinine eventually improved but they never really knew what went wrong.  Since then he has been at  home with a decreased appetite and increasing weakness/lethargy.  According to previous notes also had some hallucinations.      Patient's main complaint is of his legs, they have been bothering him since the 1950s, describes tingling and pins/needle sensation over his thighs on both sides.  This is constant.  He is not complaining of any abdominal pain, nausea or vomiting.    Family denies any history of liver disease or gallbladder issues in the past.    Denies change in bowel habits.  ER course: 140/58, hemoglobin 8.7, lipase elevated at 1076, BNP elevated 490, ammonia normal at 13, AST 175-->114, ALT 185-->146, alk phos 437-->491, total bili 2.6-->1.8; right upper quadrant ultrasound with thickened gallbladder wall measuring 4 mm with pericholecystic fluid, common bile duct diameter was 4 mm; CTAP demonstrated right pleural effusion, slightly distended gallbladder with gallbladder wall thickening and a stable 2 x 1.5 cm soft tissue thickening at the right aspect of the prostatectomy bed  Hospital course: 02/28/2022 HIDA scan with no common duct or cystic duct obstruction, low normal gallbladder ejection fraction  GI history: 12/05/2021 office visit with [facility]: At that time following up for constipation, previously presented to the ED in October for fecal disimpaction at that time recommended to use as needed Dulcolax and/or enemas  Past Medical History:  Diagnosis Date   Anemia    AV block, 2nd  degree- MDT pacemaker March 2014 03/26/2012   Bilateral plantar fasciitis 10/25/2020   CAD (coronary artery disease)    a. S/P stenting to mid RCA, prox PDA 06/1999. b. NSTEMI/CABG x 3 in 10/2011 with LIMA to LAD, SVG to PDA, and SVG to OM1.    Cephalalgia 07/14/2015   CHB (complete heart block) 05/03/2012   Overview:  Status post complete heart block heart rate 28 bpm, alternating with 2 to one AV block and drug for bradycardia.  Status post pacemaker implantation.status post  Medtronic pacemaker implantation the 03/28/2012   Chronic diastolic CHF (congestive heart failure) 02/05/2018   Chronic gouty arthritis    Chronic UTI    a. Followed by Dr. Risa Grill - colonized/asymptomatic - not on abx   CKD (chronic kidney disease)    stage 3, GFR 30-59 ml/min; stable with a creatinine around 1.9-2.0 followed by nephrology.   Constipation 03/01/2015   Symptoms and exam consistent with constipation. Abdominal exam is benign with no evidence of pain or obstruction. Discussed importance of increasing fiber and water intake coupled with physical activity to assist with bowel movements. Continue over-the-counter medication management as needed. Follow-up if symptoms worsen or fail to improve.   Coronary atherosclerosis 11/27/2011   Overview:  Multivessel coronary artery disease recently diagnosed by catheterization 2013 History of stent placement with a heparin-coated stent 2001  Last Assessment & Plan:  Status post coronary bypass grafting.  No recurrent chest pain. Overview:  Overview:  S/P stenting to mid RCA, prox PDA 06/1999;  06/2007 Myoview: negative except for apical thinning, EF 68%, last Myoview August 10, 2008 with n   Degeneration of lumbar intervertebral disc    Degenerative disc disease, cervical, with radiculopathy [id number]   Diastolic dysfunction [id number]   Grade 1 DD on Echo 06/2017   Dyslipidemia    Dysphagia 02/06/2016   3/18 - DG Esophagus:  1. Mild esophageal dysmotility, likely presbyesophagus. 2. No other explanation for patient's symptoms. 3. Small hiatal hernia.  On swallow eval - evidence of cervical spine disease and that was likely contributing   Epistaxis 12/08/2020   Essential hypertension 07/20/2006   GERD (gastroesophageal reflux disease)    Gout 05/12/2018   Gouty arthritis of right great toe 09/03/2017   Left toe Injected January 31, 2018    Hyperpigmentation 11/04/2017   Internal hemorrhoids 08/15/2016   Left inguinal hernia 07/01/2019   Lumbar  post-laminectomy syndrome 12/12/2017   Moderate aortic regurgitation 07/04/2017   Echo 06/2017:  EF 55-60%, mild LVH, grade 1 DD, mild AS, mod AR, mild MR, mild-mod TR   Neck injury    a. 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  This was switched to by mouth amiodarone at the time of the thoracic surgery appointment was discontinued.   Presence of aortocoronary bypass graft 10/24/2011   Overview:  Performed at [facility] 2013 Last Assessment & Plan:  No complication post coronary bypass grafting.   Prostate cancer ([place])    a. 2001 s/p TURP.   Pulmonary nodule    a. felt to be noncancerous.  Status post followup CT scan 4 mm and stable.   Renal  artery stenosis    a. 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 Burt Knack & Dr. 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   Trochanteric bursitis of hip, bilateral 01/10/2017   UGIB (upper gastrointestinal bleed) 02/01/2018   EGD  02/06/18 - mod, non-erosive gastritis   Venous insufficiency of leg 06/05/2010   Vitamin B12 deficiency 08/23/2009   Jan '14  July '14 B12 level  >1500    472   Weakness of both lower extremities 04/09/2020    Past Surgical History:  Procedure Laterality Date   ANTERIOR CHAMBER WASHOUT Left 10/04/2018   Procedure: Anterior Chamber Washout,  Vitreous Tap;  Surgeon: Jalene Mullet, MD;  Location: [place];  Service: Ophthalmology;  Laterality: Left;   cardia catherization  07-07-99   CARDIAC SURGERY  10/18/12   open heart surgery   CATARACT EXTRACTION W/ INTRAOCULAR LENS  IMPLANT, BILATERAL  3/205, 06/2013   [facility]   COLONOSCOPY  04/12/07   CORONARY ARTERY BYPASS GRAFT  10/19/2011   Procedure: CORONARY ARTERY BYPASS GRAFTING (CABG);  Surgeon: Gaye Pollack, MD;  Location: [place];  Service: Open Heart Surgery;  Laterality: N/A;  times three using Left Internal Mammary Artery and Right Greater Saphenouse Vein Graft harvested Endoscopically   edg  07-17-1994   FLEXIBLE SIGMOIDOSCOPY  11-03-1997   GAS INSERTION Left 10/04/2018   Procedure: Insertion Of Gas;  Surgeon: Jalene Mullet, MD;  Location: [place];  Service: Ophthalmology;  Laterality: Left;   GAS/FLUID EXCHANGE Left 10/04/2018   Procedure: Gas/Fluid Exchange;  Surgeon: Jalene Mullet, MD;  Location: [place];  Service: Ophthalmology;  Laterality: Left;   INTRAOPERATIVE TRANSTHORACIC ECHOCARDIOGRAM N/A 02/06/2022   Procedure: INTRAOPERATIVE TRANSTHORACIC ECHOCARDIOGRAM;  Surgeon: [name], MD;  Location: [place] CV LAB;  Service: Open Heart Surgery;  Laterality: N/A;   LEFT HEART CATHETERIZATION WITH CORONARY ANGIOGRAM N/A 10/16/2011   Procedure: LEFT HEART CATHETERIZATION WITH CORONARY ANGIOGRAM;  Surgeon: Peter M Martinique, MD;  Location: [facility] CATH LAB;  Service: Cardiovascular;  Laterality: N/A;   LEFT HEART CATHETERIZATION WITH CORONARY/GRAFT ANGIOGRAM N/A 03/11/2013   Procedure: LEFT HEART CATHETERIZATION WITH [name];  Surgeon: Blane Ohara, MD;  Location: [facility] CATH LAB;  Service: Cardiovascular;  Laterality: N/A;   lumbar spinal disk and neck fusion surgery     PACEMAKER INSERTION  03/28/12   PPM implanted for mobitz II AV block   PARS PLANA VITRECTOMY Left 10/04/2018   Procedure: PARS PLANA VITRECTOMY 25 GAUGE FOR ENDOPHTHALMITIS WITH INJECTION OF INTRAVITREAL  ANTIBIOTIC;  Surgeon: Jalene Mullet, MD;  Location: [place];  Service: Ophthalmology;  Laterality: Left;   peripheral vascular catherization  11-24-03   PERMANENT PACEMAKER INSERTION N/A 03/28/2012   Procedure: PERMANENT PACEMAKER INSERTION;  Surgeon: Thompson Grayer, MD;  Location: [facility] CATH LAB;  Service: Cardiovascular;  Laterality: N/A;   PROSTATECTOMY     renal circulation  10-01-03   s/p ptca     stents     X 2   stress cardiolite  05-04-05   spring 09-negative except for apical thinning, EF 68%   TRANSCATHETER AORTIC VALVE REPLACEMENT, TRANSFEMORAL Right 02/06/2022   Procedure: Transcatheter Aortic Valve Replacement, Transfemoral;  Surgeon: [name], MD;  Location: [place]  CV LAB;  Service: Open Heart Surgery;  Laterality: Right;    Family History  Problem Relation Age of Onset   Coronary artery disease Father        died @ 103   Other Mother        cerebral hemorrhage - died @ 82   Arthritis Mother    Cancer Brother        Bladder   Prostate cancer Brother    Nephritis Brother        died @ age 87.   Other Brother        cerebral hemorrhage - died @ 35   Heart disease Brother    Breast cancer Other        niece x 2   Diabetes Neg Hx    Colon cancer Neg Hx    Adrenal disorder Neg Hx     Social History   Tobacco Use   Smoking status: Never   Smokeless tobacco: Never  Vaping Use   Vaping Use: Never used  Substance Use Topics   Alcohol use: Not Currently    Comment: Rarely   Drug use: Never    Prior to Admission medications   Medication Sig Start Date End Date Taking? Authorizing Provider  acetaminophen (TYLENOL) 500 MG tablet Take 1,000 mg by mouth 3 (three) times daily as needed for moderate pain.   Yes [provider]  allopurinol (ZYLOPRIM) 100 MG tablet TAKE 1 TABLET(100 MG) BY MOUTH DAILY Patient taking differently: Take 100 mg by mouth daily. TAKE 1 TABLET(100 MG) BY MOUTH DAILY 12/04/21  Yes Burns, Claudina Lick, MD  amLODipine (NORVASC) 10 MG  tablet Take 1 tablet (10 mg total) by mouth daily. 02/07/22  Yes Eileen Stanford, PA-C  amoxicillin (AMOXIL) 500 MG tablet TAKE 4 TABLETS (2000 MG) BY MOUTH 1 HOUR PRIOR TO DENTAL APPOINTMENTS AND PROCEDURES 02/14/22  Yes Kathyrn Drown D, NP  Aromatic Inhalants (VICKS VAPOR INHALER IN) Place 1 puff into both nostrils as needed (for congestion).   Yes [provider]  Ascorbic Acid (VITAMIN C) 1000 MG tablet Take 1,000 mg by mouth daily.   Yes [provider]  aspirin EC 81 MG tablet Take 81 mg by mouth daily.   Yes [provider]  Calcium Citrate-Vitamin D (CITRACAL + D PO) Take 2 tablets by mouth in the morning and at bedtime.   Yes [provider]  Carboxymeth-Glycerin-Polysorb (REFRESH OPTIVE MEGA-3 OP) Place 1 drop into both eyes 2 (two) times daily.   Yes [provider]  Cholecalciferol 25 MCG (1000 UT) capsule Take 1,000 Units by mouth daily.   Yes [provider]  cyanocobalamin (,VITAMIN B-12,) 1000 MCG/ML injection Inject 1,000 mcg into the muscle once. Monthly injection   Yes [provider]  folic acid (FOLVITE) 1 MG tablet Take 1 tablet (1 mg total) by mouth daily. Annual appt due in May must see provider for future refills Patient taking differently: Take 1 mg by mouth at bedtime. Annual appt due in May must see provider for future refills 12/04/21  Yes Burns, Claudina Lick, MD  furosemide (LASIX) 40 MG tablet TAKE 1 TABLET BY MOUTH EVERY DAY Patient taking differently: Take 40 mg by mouth as needed for fluid or edema. 04/05/21  Yes Burns, Claudina Lick, MD  hydrALAZINE (APRESOLINE) 50 MG tablet Take 1 tablet (50 mg total) by mouth 3 (three) times daily. 02/08/22  Yes Eileen Stanford, PA-C  loratadine (CLARITIN) 10 MG tablet Take 10  mg by mouth daily as needed (for seasonal allergies).   Yes [provider]  Multiple Vitamins-Minerals (PRESERVISION AREDS 2 PO) Take 1 tablet by mouth in the morning and at bedtime.   Yes  [provider]  nitroGLYCERIN (NITROSTAT) 0.4 MG SL tablet DISSOLVE 1 TABLET UNDER THE TONGUE EVERY 5 MINUTES AS NEEDED FOR CHEST PAIN, MAXIMUM 3 TABLETS Patient taking differently: Place 0.4 mg under the tongue every 5 (five) minutes as needed for chest pain. 04/30/19  Yes Burns, Claudina Lick, MD  Saline (AYR NASAL MIST ALLERGY/SINUS NA) Place 2 sprays into the nose as needed (dryness).   Yes [provider]  trolamine salicylate (ASPERCREME) 10 % cream Apply 1 Application topically in the morning and at bedtime. For leg cramps   Yes [provider]  Vitamin D, Ergocalciferol, (DRISDOL) 1.25 MG (50000 UNIT) CAPS capsule Take 50,000 Units by mouth every 30 (thirty) days. 07/13/21  Yes [provider]    Current Facility-Administered Medications  Medication Dose Route Frequency Provider Last Rate Last Admin   acetaminophen (TYLENOL) tablet 650 mg  650 mg Oral Q6H PRN Kristopher Oppenheim, DO       Or   acetaminophen (TYLENOL) suppository 650 mg  650 mg Rectal Q6H PRN Kristopher Oppenheim, DO       bumetanide Cleda Clarks) injection 1 mg  1 mg Intravenous Q12H Kristopher Oppenheim, DO   1 mg at 03/01/22 0540   heparin injection 5,000 Units  5,000 Units Subcutaneous Q8H Kristopher Oppenheim, DO   5,000 Units at 03/01/22 0530   ondansetron (ZOFRAN) tablet 4 mg  4 mg Oral Q6H PRN Kristopher Oppenheim, DO       Or   ondansetron [facility]) injection 4 mg  4 mg Intravenous Q6H PRN Kristopher Oppenheim, DO        Allergies as of 02/27/2022 - Review Complete 02/27/2022  Allergen Reaction Noted   Aspirin Nausea And Vomiting and Other (See Comments) 11/27/2011   Lisinopril Anaphylaxis and Shortness Of Breath 06/04/2017   Amoxicillin Nausea Only    Atarax [hydroxyzine hcl] Nausea And Vomiting 07/18/2010   Cephalexin Diarrhea 01/10/2016   Ciprofloxacin Nausea Only    Clindamycin Other (See Comments) 04/04/2017   Clobetasol Other (See Comments) 02/14/2016   Codeine Nausea Only and Other (See Comments)    Fish allergy Nausea And Vomiting  10/04/2018   Fluarix [influenza virus vaccine] Itching 10/22/2016   Haemophilus influenzae Itching 10/22/2016   Hydrocodone Nausea Only    Hydrocodone-acetaminophen Nausea And Vomiting 11/27/2011   Hydroxyzine Nausea And Vomiting 03/30/2013   Latex Itching and Other (See Comments) 11/14/2016   Macrobid [nitrofurantoin macrocrystal] Nausea Only 10/15/2011   Niacin Other (See Comments)    Niacin-lovastatin er Other (See Comments) 07/18/2010   Niacin-lovastatin er Other (See Comments) 04/04/2017   Nitrofurantoin Other (See Comments)    Omeprazole Other (See Comments) 10/04/2018   Other Nausea And Vomiting 10/04/2018   Tramadol Other (See Comments) 11/08/2020   Vibramycin [doxycycline] Other (See Comments) 04/04/2017   Adhesive [tape] Rash 10/04/2018   Bactrim [sulfamethoxazole-trimethoprim] Rash 07/14/2013   Colchicine Rash 04/23/2017   Gabapentin Other (See Comments) 05/26/2018   Nortriptyline Rash 01/22/2020     Review of Systems:    Constitutional: No weight loss, fever or chills Skin: No rash  Cardiovascular: No chest pain Respiratory: No SOB  Gastrointestinal: See HPI and otherwise negative Genitourinary: No dysuria  Neurological: No headache Musculoskeletal: No new muscle or joint pain Hematologic: No bleeding  Psychiatric: No history of depression or  anxiety    Physical Exam:  Vital signs in last 24 hours: Temp:  [97.7 F (36.5 C)-98.2 F (36.8 C)] 97.9 F (36.6 C) (02/22 0830) Pulse Rate:  [63-65] 63 (02/22 0830) Resp:  [16-18] 16 (02/22 0830) BP: (114-138)/(53-62) 138/60 (02/22 0830) SpO2:  [94 %-97 %] 97 % (02/22 0830)   General:   Pleasant Elderly Caucasian male appears to be in NAD, Well developed, Well nourished, alert and cooperative Head:  Normocephalic and atraumatic. Eyes:   PEERL, EOMI. No icterus. Conjunctiva pink. Ears:  Normal auditory acuity. Neck:  Supple Throat: Oral cavity and pharynx without inflammation, swelling or lesion.  Lungs:  Respirations even and unlabored. Lungs clear to auscultation bilaterally.   No wheezes, crackles, or rhonchi.  Heart: Normal S1, S2. No MRG. Regular rate and rhythm. No peripheral edema, cyanosis or pallor.  Abdomen:  Soft, nondistended, nontender. No rebound or guarding. Normal bowel sounds. No appreciable masses or hepatomegaly. Rectal:  Not performed.  Msk:  Symmetrical without gross deformities. Peripheral pulses intact.  Extremities:  Without edema, no deformity or joint abnormality.  Neurologic:  Alert and  oriented x4;  grossly normal neurologically.   Skin:   Dry and intact without significant lesions or rashes. Psychiatric: Demonstrates good judgement and reason without abnormal affect or behaviors. +histrionic   LAB RESULTS: Recent Labs    02/27/22 1958 02/28/22 0353 03/01/22 0529  WBC 6.8 5.8 6.2  HGB 8.7* 8.2* 7.9*  HCT 25.3* 23.7* 22.2*  PLT 261 236 205   BMET Recent Labs    02/27/22 1958 02/28/22 0353 03/01/22 0529  NA 132* 131* 134*  K 3.8 3.5 3.5  CL 104 104 106  CO2 15* 18* 19*  GLUCOSE 111* 93 84  BUN 65* 68* 67*  CREATININE 3.29* 3.23* 3.31*  CALCIUM 9.6 9.3 9.0      Latest Ref Rng & Units 03/01/2022    5:29 AM 02/28/2022    3:53 AM 02/27/2022    7:58 PM  Hepatic Function  Total Protein 6.5 - 8.1 g/dL 5.3  5.8  6.3   Albumin 3.5 - 5.0 g/dL 2.9  3.0  3.3   AST 15 - 41 U/L 114  162  175   ALT 0 - 44 U/L 146  174  185   Alk Phosphatase 38 - 126 U/L 491  453  437   Total Bilirubin 0.3 - 1.2 mg/dL 1.8  2.3  2.6      STUDIES: NM Hepato W/EF  Result Date: 02/28/2022 CLINICAL DATA:  Elevated liver function tests EXAM: NUCLEAR MEDICINE HEPATOBILIARY IMAGING WITH GALLBLADDER EF TECHNIQUE: Sequential images of the abdomen were obtained out to 60 minutes following intravenous administration of radiopharmaceutical. After oral ingestion of Ensure, gallbladder ejection fraction was determined. At 60 min, normal ejection fraction is greater than 33%.  RADIOPHARMACEUTICALS:  7.8 mCi Tc-62m Choletec IV COMPARISON:  CT 02/27/2022 and ultrasound FINDINGS: Prompt uptake and biliary excretion of activity by the liver is seen. Gallbladder activity is visualized, consistent with patency of cystic duct. Biliary activity passes into small bowel, consistent with patent common bile duct. Calculated gallbladder ejection fraction is 36%. (Normal gallbladder ejection fraction with Ensure is greater than 33%.) IMPRESSION: No common duct or cystic duct obstruction. Low normal gallbladder ejection fraction Electronically Signed   By: AJill SideM.D.   On: 02/28/2022 10:42   CT ABDOMEN PELVIS WO CONTRAST  Result Date: 02/27/2022 CLINICAL DATA:  Abnormal lab results right upper quadrant pain EXAM: CT ABDOMEN AND  PELVIS WITHOUT CONTRAST TECHNIQUE: Multidetector CT imaging of the abdomen and pelvis was performed following the standard protocol without IV contrast. RADIATION DOSE REDUCTION: This exam was performed according to the departmental dose-optimization program which includes automated exposure control, adjustment of the mA and/or kV according to patient size and/or use of iterative reconstruction technique. COMPARISON:  Ultrasound 02/27/2022, CT 01/10/2022, PET CT 12/21/2021 FINDINGS: Lower chest: Lung bases demonstrate small left and moderate right pleural effusions. Dependent atelectasis at the bases. Coronary vascular calcifications. Partially visualized intracardiac pacing leads. Interval TAVR. Cardiomegaly with trace pericardial effusion. Hepatobiliary: No focal hepatic abnormality or biliary dilatation. No calcified stone. Slightly distended gallbladder with diffuse gallbladder wall thickening and possible pericholecystic fluid. Pancreas: Unremarkable. No pancreatic ductal dilatation or surrounding inflammatory changes. Spleen: Borderline enlarged. Adrenals/Urinary Tract: Adrenal glands are normal. Kidneys show no hydronephrosis. Bilateral renal cysts for which no  imaging follow-up is recommended. Hyperdense subcentimeter cortical lesions in the kidneys measuring up to 9 mm too small to further characterize. Small right kidney. The bladder is unremarkable Stomach/Bowel: The stomach is nonenlarged. There is no dilated small bowel. No acute bowel wall thickening. Vascular/Lymphatic: Moderate aortic atherosclerosis. No aneurysm. No suspicious lymph nodes. Reproductive: Prostatectomy. Stable soft tissue thickening at the right aspect of the prostatectomy bed measuring 2 x 1.5 cm. Other: No free air. Small volume free fluid in the abdomen and pelvis. Generalized subcutaneous edema consistent with anasarca. Metallic foreign body at the anterior pelvis with artifact. Musculoskeletal: No acute or suspicious osseous abnormality. IMPRESSION: 1. Slightly distended gallbladder with diffuse gallbladder wall thickening and possible pericholecystic fluid. Reference previously same day right upper quadrant ultrasound. 2. Cardiomegaly with small left and moderate right pleural effusions. Small volume free fluid in the abdomen and pelvis. Generalized subcutaneous edema consistent with anasarca. 3. Aortic atherosclerosis. Aortic Atherosclerosis (ICD10-I70.0). Electronically Signed   By: Donavan Foil M.D.   On: 02/27/2022 23:19   US Abdomen Limited RUQ (LIVER/GB)  Result Date: 02/27/2022 CLINICAL DATA:  Abdominal pain. EXAM: ULTRASOUND ABDOMEN LIMITED RIGHT UPPER QUADRANT COMPARISON:  CT of the chest abdomen pelvis dated 01/10/2022. FINDINGS: Gallbladder: There is no gallstone. The gallbladder wall is thickened measuring 4 mm. There is pericholecystic fluid. Negative sonographic Murphy's sign. Common bile duct: Diameter: 4 mm Liver: The liver is unremarkable. Portal vein is patent on color Doppler imaging with normal direction of blood flow towards the liver. Other: There is CAD partially visualized right pleural effusion. A 4.5 cm right renal upper pole cyst. IMPRESSION: 1. No gallstone.  Thickened gallbladder wall with small pericholecystic fluid of indeterminate etiology. Clinical correlation is recommended. A hepatobiliary scintigraphy may provide better evaluation of the gallbladder if there is a high clinical concern for acute cholecystitis . 2. Right pleural effusion. Electronically Signed   By: Anner Crete M.D.   On: 02/27/2022 21:41      Impression / Plan:   Impression: 1.  Elevated LFTs: LFTs are actually trending down today, workup of gallbladder with no choledocholithiasis and patient with no abdominal pain making hepatobiliary cause less likely; still consider possibility of relation to heart failure vs less likely hepatobiliary 2.  Elevated lipase: But no sign of pancreatitis on imaging; uncertain etiology 3.  Recent TAVR: In January, echo 02/22/2022 with stable TAVR and LVEF 55% 4.  Acute renal failure superimposed on stage IV chronic kidney disease 5.  Anasarca 6.  AV block, second-degree: Status post pacemaker March 2014 6.  Heart failure with preserved ejection fraction   Plan: 1.  Will discuss  case with Dr. Tarri Glenn.  Unable to do an MRI/MRCP given patient's pacemaker status.  Not sure that we have much to offer. 2.  Please await further recommendations from Dr. Tarri Glenn.  Continue supportive measures.  Thank you for your kind consultation, we will continue to follow.  [facility]  03/01/2022, 9:40 AM

## 2022-03-01 NOTE — Consult Note (Addendum)
Nephrology Consult   Assessment/Recommendations:   AKI on CKD4 -followed by Dr. Joelyn Oms. Baseline Cr typically in the 2's -AKI likely multifactorial: bladder outlet obstruction, decreased EABV in the context of vascular congestion. Good urine output once foley had been placed, would maintain for now -Agree with holding diuretics for today. Cr seems to be relatively plateau'ed for the last day or so -renal ultrasound pending  -no significant findings with UA on 2/21 -Avoid nephrotoxic medications including NSAIDs and iodinated intravenous contrast exposure unless the latter is absolutely indicated.  Preferred narcotic agents for pain control are hydromorphone, fentanyl, and methadone. Morphine should not be used. Avoid Baclofen and avoid oral sodium phosphate and magnesium citrate based laxatives / bowel preps. Continue strict Input and Output monitoring. Will monitor the patient closely with you and intervene or adjust therapy as indicated by changes in clinical status/labs   Acute urinary retention -now with foley  Anasarca -diuretics on hold for today. His volume status seems to be improving -if unable to diurese, could consider a RHC to further evaluate his pressures -check strict I/O, daily weights  Elevated LFT's -secondary to congestive hepatopathy? Improved today with diuresis  HFpEF, recent TAVR -bumex on hold as above  HTN -home BP meds on hold in the context of diuresis. Given age, BP is currently acceptable  Metabolic acidosis -likely secondary to AKI -if no improvement especially if Cr improves, then would start nahco3 679m BID  Anemia, microcytic -transfuse PRN for hgb <7 -will check iron panel -consider checking FOBT-will defer to primary service  Recommendations conveyed to primary service. Discussed with family at the bedside.   [facility] 03/01/2022 3:05  PM   _____________________________________________________________________________________   History of Present Illness: Mike BRODis a/an 89y.o. male with a past medical history of CKD4,. HTN, dCHF/HFpEF, severe AS s/p TAVR 02/06/22, h/o prostate ca s/p prostatectomy, 2nd [facility]. Found to have anasarca here, diuresed with bumex. Also found to have urinary retention, s/p foley. Patient seen and examined bedside. Family and wife at bedside as well. Patient reports that his breathing was never really an issue. He does report that his swelling has improved since being here. No other complaints at this time. He has already made 1600cc of urine. Weight is down from 74.4kg to 70.8kg.   Medications:  Current Facility-Administered Medications  Medication Dose Route Frequency Provider Last Rate Last Admin   acetaminophen (TYLENOL) tablet 650 mg  650 mg Oral Q6H PRN CKristopher Oppenheim DO       Or   acetaminophen (TYLENOL) suppository 650 mg  650 mg Rectal Q6H PRN CKristopher Oppenheim DO       heparin injection 5,000 Units  5,000 Units Subcutaneous Q8H CKristopher Oppenheim DO   5,000 Units at 03/01/22 1458   ondansetron (ZOFRAN) tablet 4 mg  4 mg Oral Q6H PRN CKristopher Oppenheim DO       Or   ondansetron ([facility] injection 4 mg  4 mg Intravenous Q6H PRN CKristopher Oppenheim DO         ALLERGIES Aspirin, Lisinopril, Amoxicillin, Atarax [hydroxyzine hcl], Cephalexin, Ciprofloxacin, Clindamycin, Clobetasol, Codeine, Fish allergy, Fluarix [influenza virus vaccine], Haemophilus influenzae, Hydrocodone, Hydrocodone-acetaminophen, Hydroxyzine, Latex, Macrobid [nitrofurantoin macrocrystal], Niacin, Niacin-lovastatin er, Niacin-lovastatin er, Nitrofurantoin, Omeprazole, Other, Tramadol, Vibramycin [doxycycline], Adhesive [tape], Bactrim [sulfamethoxazole-trimethoprim], Colchicine, Gabapentin, and Nortriptyline  MEDICAL HISTORY Past Medical History:  Diagnosis Date   Anemia    AV block,  2nd degree- MDT  pacemaker March 2014 03/26/2012   Bilateral plantar fasciitis 10/25/2020   CAD (coronary artery disease)    a. S/P stenting to mid RCA, prox PDA 06/1999. b. NSTEMI/CABG x 3 in 10/2011 with LIMA to LAD, SVG to PDA, and SVG to OM1.    Cephalalgia 07/14/2015   CHB (complete heart block) 05/03/2012   Overview:  Status post complete heart block heart rate 28 bpm, alternating with 2 to one AV block and drug for bradycardia.  Status post pacemaker implantation.status post Medtronic pacemaker implantation the 03/28/2012   Chronic diastolic CHF (congestive heart failure) 02/05/2018   Chronic gouty arthritis    Chronic UTI    a. Followed by Dr. Risa Grill - colonized/asymptomatic - not on abx   CKD (chronic kidney disease)    stage 3, GFR 30-59 ml/min; stable with a creatinine around 1.9-2.0 followed by nephrology.   Constipation 03/01/2015   Symptoms and exam consistent with constipation. Abdominal exam is benign with no evidence of pain or obstruction. Discussed importance of increasing fiber and water intake coupled with physical activity to assist with bowel movements. Continue over-the-counter medication management as needed. Follow-up if symptoms worsen or fail to improve.   Coronary atherosclerosis 11/27/2011   Overview:  Multivessel coronary artery disease recently diagnosed by catheterization 2013 History of stent placement with a heparin-coated stent 2001  Last Assessment & Plan:  Status post coronary bypass grafting.  No recurrent chest pain. Overview:  Overview:  S/P stenting to mid RCA, prox PDA 06/1999;  06/2007 Myoview: negative except for apical thinning, EF 68%, last Myoview August 10, 2008 with n   Degeneration of lumbar intervertebral disc    Degenerative disc disease, cervical, with radiculopathy [id number]   Diastolic dysfunction 99991111   Grade 1 DD on Echo 06/2017   Dyslipidemia    Dysphagia 02/06/2016   3/18 - DG Esophagus:  1. Mild esophageal dysmotility, likely  presbyesophagus. 2. No other explanation for patient's symptoms. 3. Small hiatal hernia.  On swallow eval - evidence of cervical spine disease and that was likely contributing   Epistaxis 12/08/2020   Essential hypertension 07/20/2006   GERD (gastroesophageal reflux disease)    Gout 05/12/2018   Gouty arthritis of right great toe 09/03/2017   Left toe Injected January 31, 2018    Hyperpigmentation 11/04/2017   Internal hemorrhoids 08/15/2016   Left inguinal hernia 07/01/2019   Lumbar post-laminectomy syndrome 12/12/2017   Moderate aortic regurgitation 07/04/2017   Echo 06/2017:  EF 55-60%, mild LVH, grade 1 DD, mild AS, mod AR, mild MR, mild-mod TR   Neck injury    a. 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  This was switched to by mouth amiodarone at the time of the thoracic surgery appointment was discontinued.   Presence of aortocoronary bypass graft 10/24/2011   Overview:  Performed at [facility] 2013 Last Assessment & Plan:  No complication post coronary bypass grafting.   Prostate cancer ([place])    a. 2001 s/p TURP.   Pulmonary nodule    a. felt to be noncancerous.  Status post followup CT scan 4 mm and stable.   Renal artery stenosis  a. 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 Burt Knack & Dr. Lavonna Monarch   Severe aortic stenosis    Spinal stenosis of lumbar region 10/09/2017   Symptomatic bradycardia    Mobitz II AV block  s/p Medtronic pacemaker 03/28/12   Trochanteric bursitis of hip, bilateral 01/10/2017   UGIB (upper gastrointestinal bleed) 02/01/2018   EGD  02/06/18 - mod, non-erosive gastritis   Venous insufficiency of leg 06/05/2010   Vitamin B12 deficiency 08/23/2009   Jan '14  July '14 B12 level  >1500    472   Weakness of both lower extremities 04/09/2020     SOCIAL HISTORY Social History   Socioeconomic History   Marital status: Married    Spouse name: [name]   Number of children: 2   Years of education: 13   Highest education level: Some college, no degree  Occupational History   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  married '52 - 3 years, divorced; married '56 - 3 years divorced; married '44-12 yrs - divorced; married '75 -. 1 son '57; 1 daughter - '53; 1 grandchild.  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  Very happily married.  End of life care: yes CPR, no long term mechanical ventilation, no heroic measures.right handed   Right handed   Social Determinants of Health   Financial Resource Strain: Low Risk  (07/28/2021)   Overall Financial Resource Strain (CARDIA)    Difficulty of Paying Living Expenses: Not hard at all  Food Insecurity: No Food Insecurity (02/28/2022)   Hunger Vital Sign    Worried About Running Out of Food in the Last Year: Never true    Ran Out of Food in the Last Year: Never true  Transportation Needs: No Transportation Needs (02/28/2022)   PRAPARE -  Hydrologist (Medical): No    Lack of Transportation (Non-Medical): No  Physical Activity: Insufficiently Active (07/28/2021)   Exercise Vital Sign    Days of Exercise per Week: 2 days    Minutes of Exercise per Session: 20 min  Stress: No Stress Concern Present (07/28/2021)   [place]    Feeling of Stress : Not at all  Social Connections: [place] (07/28/2021)   Social Connection and Isolation Panel [NHANES]    Frequency of Communication with Friends and Family: Three times a week    Frequency of Social Gatherings with Friends and Family: Three times a week    Attends Religious Services: More than 4 times per year    Active Member of Clubs or Organizations: Yes    Attends Archivist Meetings: 1 to 4 times per year    Marital Status: Married  Intimate  Partner Violence: Not At Risk (02/28/2022)   Humiliation, Afraid, Rape, and Kick questionnaire    Fear of Current or Ex-Partner: No    Emotionally Abused: No    Physically Abused: No    Sexually Abused: No     FAMILY HISTORY Family History  Problem Relation Age of Onset   Coronary artery disease Father        died @ 4   Other Mother        cerebral hemorrhage - died @ 18   Arthritis Mother    Cancer Brother        Bladder   Prostate cancer Brother    Nephritis Brother        died @ age 82.   Other Brother        cerebral hemorrhage - died @ 33   Heart disease Brother    Breast cancer Other        niece x 2   Diabetes Neg Hx    Colon cancer Neg Hx    Adrenal disorder Neg Hx      Review of Systems: 12 systems reviewed Otherwise as per HPI, all other systems reviewed and negative  Physical Exam: Vitals:   03/01/22 0330 03/01/22 0830  BP: (!) 136/57 138/60  Pulse: 65 63  Resp: 18 16  Temp: 97.7 F (36.5 C) 97.9 F (36.6 C)  SpO2: 95% 97%   No intake/output data recorded.  Intake/Output Summary  (Last 24 hours) at 03/01/2022 1505 Last data filed at 03/01/2022 0350 Gross per 24 hour  Intake 60 ml  Output 1650 ml  Net -1590 ml   General: well-appearing, no acute distress HEENT: anicteric sclera, dry MM CV: regular rate, normal rhythm, +systolic murmur, no gallops, no rubs Lungs: clear to auscultation bilaterally, normal work of breathing Abd: soft, non-tender, non-distended Skin: no visible lesions or rashes Psych: alert, engaged, appropriate mood and affect Musculoskeletal: trace to 1+ pitting edema b/l LE's Neuro: normal speech, no gross focal deficits   Test Results Reviewed Lab Results  Component Value Date   NA 134 (L) 03/01/2022   K 3.5 03/01/2022   CL 106 03/01/2022   CO2 19 (L) 03/01/2022   BUN 67 (H) 03/01/2022   CREATININE 3.31 (H) 03/01/2022   GFR 17.14 (L) 02/27/2022   GLU 72 09/10/2013   CALCIUM 9.0 03/01/2022   ALBUMIN 2.9 (L) 03/01/2022   PHOS 3.1 07/12/2021     I have reviewed all relevant outside healthcare records related to the patient's kidney injury.

## 2022-03-01 NOTE — Plan of Care (Signed)
  Problem: Clinical Measurements: Goal: Will remain free from infection Outcome: Progressing   Problem: Clinical Measurements: Goal: Respiratory complications will improve Outcome: Progressing   Problem: Activity: Goal: Risk for activity intolerance will decrease Outcome: Progressing   Problem: Nutrition: Goal: Adequate nutrition will be maintained Outcome: Not Progressing Note: Appetite remain poor,.   Problem: Safety: Goal: Ability to remain free from injury will improve Outcome: Progressing   Problem: Skin Integrity: Goal: Risk for impaired skin integrity will decrease Outcome: Progressing

## 2022-03-01 NOTE — Evaluation (Signed)
Physical Therapy Evaluation Patient Details Name: [name] MRN: [id number] DOB: [birth year] Today's Date: 03/01/2022  History of Present Illness  89 yo male [facility] on 2/20 with acute renal failure superimposed on CKD IV. PMH includes CAD s/p CABG, HFpEF (EF 55-60%), severe aortic stenosis s/p TAVR (02/06/2022), symptomatic bradycardia s/p PPM, CKD stage IIIb, anemia of chronic disease, HTN, HLD, prostate cancer.  Clinical Impression   .Pt presents with generalized weakness, impaired safety awareness and insight into deficits, impaired balance, decreased activity tolreance vs basline. Pt to benefit from acute PT to address deficits. Pt ambulated hallway distance with use of RW, requiring up to light physical assist during mobility. Pt lives at home with his wife, and his friend at bedside states she helps as needed as well. Recommendations below, PT to progress mobility as tolerated, and will continue to follow acutely.         Recommendations for follow up therapy are one component of a multi-disciplinary discharge planning process, led by the attending physician.  Recommendations may be updated based on patient status, additional functional criteria and insurance authorization.  Follow Up Recommendations Home health PT      Assistance Recommended at Discharge Frequent or constant Supervision/Assistance  Patient can return home with the following  A little help with walking and/or transfers;A little help with bathing/dressing/bathroom;Assistance with cooking/housework;Help with stairs or ramp for entrance    Equipment Recommendations None recommended by PT  Recommendations for Other Services       Functional Status Assessment Patient has had a recent decline in their functional status and demonstrates the ability to make significant improvements in function in a reasonable and predictable amount of time.     Precautions / Restrictions Precautions Precautions:  Fall Restrictions Weight Bearing Restrictions: No      Mobility  Bed Mobility Overal bed mobility: Needs Assistance Bed Mobility: Supine to Sit     Supine to sit: Min assist, HOB elevated     General bed mobility comments: assist for trunk elevation and LE progression off EOB    Transfers Overall transfer level: Needs assistance Equipment used: Rolling walker (2 wheels) Transfers: Sit to/from Stand Sit to Stand: Min assist           General transfer comment: assist for rise and steady, x2 during session    Ambulation/Gait Ambulation/Gait assistance: Min guard Gait Distance (Feet): -15 Feet Assistive device: Rolling walker (2 wheels) Gait Pattern/deviations: Step-through pattern, Decreased stride length, Trunk flexed Gait velocity: decr     General Gait Details: cues for upright posture and proximity to [employer]    Modified Rankin (Stroke Patients Only)       Balance Overall balance assessment: Needs assistance Sitting-balance support: No upper extremity supported Sitting balance-Leahy Scale: Fair     Standing balance support: Bilateral upper extremity supported, During functional activity Standing balance-Leahy Scale: Poor                               Pertinent Vitals/Pain Pain Assessment Pain Assessment: No/denies pain    Home Living Family/patient expects to be discharged to:: Private residence Living Arrangements: Spouse/significant other Available Help at Discharge: Family Type of Home: House Home Access: Stairs to enter Entrance Stairs-Rails: Psychiatric nurse of Steps: 2   Home Layout: One level Home Equipment: Shower seat - built Medical sales representative (2 wheels);Cane -  single point      Prior Function Prior Level of Function : Independent/Modified Independent                     Hand Dominance   Dominant Hand: Right    Extremity/Trunk Assessment   Upper  Extremity Assessment Upper Extremity Assessment: Defer to OT evaluation    Lower Extremity Assessment Lower Extremity Assessment: Generalized weakness    Cervical / Trunk Assessment Cervical / Trunk Assessment: Kyphotic  Communication   Communication: No difficulties  Cognition Arousal/Alertness: Awake/alert Behavior During Therapy: WFL for tasks assessed/performed Overall Cognitive Status: Impaired/Different from baseline Area of Impairment: Safety/judgement, Problem solving                         Safety/Judgement: Decreased awareness of safety, Decreased awareness of deficits   Problem Solving: Slow processing, Difficulty sequencing, Requires verbal cues, Requires tactile cues General Comments: pt lacks insight into current deficits, also is a questionable historian saying he has not had trouble mobilizing at home since TAVR but pt's neighbor/friend says there have been times he hasn't left the bed for 2 days at a time        General Comments      Exercises     Assessment/Plan    PT Assessment Patient needs continued PT services  PT Problem List Decreased strength;Decreased mobility;Decreased activity tolerance;Decreased balance;Decreased knowledge of use of DME;Cardiopulmonary status limiting activity;Decreased knowledge of precautions;Decreased safety awareness       PT Treatment Interventions DME instruction;Therapeutic activities;Therapeutic exercise;Gait training;Patient/family education;Stair training;Balance training;Functional mobility training;Neuromuscular re-education    PT Goals (Current goals can be found in the Care Plan section)  Acute Rehab PT Goals Patient Stated Goal: home PT Goal Formulation: With patient/family Time For Goal Achievement: 03/14/22 Potential to Achieve Goals: Good    Frequency Min 3X/week     Co-evaluation               AM-PAC PT "6 Clicks" Mobility  Outcome Measure Help needed turning from your back to your  side while in a flat bed without using bedrails?: A Little Help needed moving from lying on your back to sitting on the side of a flat bed without using bedrails?: A Little Help needed moving to and from a bed to a chair (including a wheelchair)?: A Little Help needed standing up from a chair using your arms (e.g., wheelchair or bedside chair)?: A Little Help needed to walk in hospital room?: A Little Help needed climbing 3-5 steps with a railing? : A Little 6 Click Score: 18    End of Session   Activity Tolerance: Patient tolerated treatment well Patient left: in chair;with call bell/phone within reach;with chair alarm set Nurse Communication: Mobility status;Other (comment) (posey alarm activated, but needs gray nurse call cord, PT provided cord for RN to place) PT Visit Diagnosis: Unsteadiness on feet (R26.81);Muscle weakness (generalized) (M62.81);History of falling (Z91.81)    Time: [id number] PT Time Calculation (min) (ACUTE ONLY): 33 min   Charges:   PT Evaluation $PT Eval Low Complexity: 1 Low PT Treatments $Therapeutic Activity: 8-22 mins        Stacie Glaze, PT DPT Acute Rehabilitation Services Pager [phone]  Office [phone]   [name] 03/01/2022, 5:25 PM

## 2022-03-01 NOTE — Progress Notes (Signed)
Progress Note   Patient: [name] [id number] DOB: [birth year] DOA: 02/27/2022     0 DOS: the patient was seen and examined on 03/01/2022   Brief hospital course: 89 year old male history of diastolic heart failure, hypertension, CKD stage IV, recent TAVR for severe aortic stenosis on February 06, 2022, history of second-degree AV block status post pacemaker, prior prostate cancer status post prostatectomy who presents to the ER today from the PCP office secondary to worsening renal failure and elevated LFTs.   Assessment and Plan: * Acute renal failure superimposed on stage 4 chronic kidney disease ([place]) Observation med/surg bed. Pt with acute urinary retention. Will place foley catheter. Worsening Scr. Pt has soft tissue mass near prostatectomy bed. PET/CT in 12-2021 could not see any activity due to intense radiotracer activity next to bladder. Pt voided 120 ml. Post-void bladder scan was 301. With post void of 575cc after foley placed -Cont with foley cath. Good urine output since foley was placed -Cr remains unchanged. Edema seems improved, but persists. Pt's membranes appear dry -Would hold further diuretic for now. Ordered renal US. Have consulted Nephrology to assist with ARF   Acute urinary retention Continued wtih foley catheter. Pt is s/p prostatectomy. PSA pending   Elevated LFTs -Pt with elevated LFTs. No pain. CT and RUQ show thickened gallbladder wall without cholelithiasis. There is a small amount of pericholecystic fluid.  -Pt without RUQ pain. -HIDA reviewed, no common duct or cystic duct obstruction. Low normal GB EF -Liver appears normal on CT and [place] -Appreciate input by Cardiology. Unlikely passive hepatic congestion or hepatorenal syndrome -Will check acute hepatits panel. Recheck LFT's in AM   Anasarca -Unclear what his anasarca is from. Pt's appetite is quite poor and his serum protein and albumin have been dropping since his TAVR.  -Norvasc was stopped    AV block, 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 -Chronic.   Pleural effusion on right -Given trial of diuresis, now held secondary to dry appearing membranes   DNR (do not resuscitate)/DNI(Do Not Intubate) -Admitting physican verified with pt and wife that he is a DNR/DNI.   (HFpEF) heart failure with preserved ejection fraction (HCC) Pt has been on lasix for about 12 months.  -Good urine output with bumex and foley cath -Bumex now on hold secondary to dry appearing membranes and persistently elevated Cr   S/P TAVR (transcatheter aortic valve replacement) Stable. Had echo 02-22-2022 that showed stable TAVR and LVEF 55%.   Essential hypertension Hold hydralazine and norvasc during diuresis.      Subjective: Reports feeling better today  Physical Exam: Vitals:   02/28/22 1656 02/28/22 1950 03/01/22 0330 03/01/22 0830  BP: 131/62 (!) 126/58 (!) 136/57 138/60  Pulse: 65 63 65 63  Resp: 18 16 18 16  $ Temp: 98.1 F (36.7 C) 98.2 F (36.8 C) 97.7 F (36.5 C) 97.9 F (36.6 C)  TempSrc: Oral Oral Oral Oral  SpO2: 97% 94% 95% 97%  Weight:      Height:       General exam: Conversant, in no acute distress, membranes appear dry Respiratory system: normal chest rise, clear, no audible wheezing Cardiovascular system: regular rhythm, s1-s2 Gastrointestinal system: Nondistended, nontender, pos BS Central nervous system: No seizures, no tremors Extremities: No cyanosis, no joint deformities Skin: No rashes, no pallor Psychiatry: Affect normal // no auditory hallucinations   Data Reviewed:  Labs reviewed: Na 134, K 3.5, Cr 3.31, Hgb 7.9  Family Communication: Pt in room, family at bedside  Disposition: Status is: Inpatient Continue inpatient stay because: Severity of illness  Planned Discharge Destination:  Unclear at this time    Author: Marylu Lund, MD 03/01/2022 2:30 PM  For on call review [email or web address].

## 2022-03-02 ENCOUNTER — Encounter (HOSPITAL_COMMUNITY): Payer: Self-pay | Admitting: Cardiovascular Disease

## 2022-03-02 ENCOUNTER — Encounter (HOSPITAL_COMMUNITY)
Admission: EM | Disposition: A | Discharge status: Home Health | Payer: Self-pay | Source: Home / Self Care | Attending: Internal Medicine

## 2022-03-02 DIAGNOSIS — N179 Acute kidney failure, unspecified: Secondary | ICD-10-CM | POA: Diagnosis not present

## 2022-03-02 DIAGNOSIS — I509 Heart failure, unspecified: Secondary | ICD-10-CM | POA: Diagnosis not present

## 2022-03-02 DIAGNOSIS — R7989 Other specified abnormal findings of blood chemistry: Secondary | ICD-10-CM | POA: Diagnosis not present

## 2022-03-02 DIAGNOSIS — Z950 Presence of cardiac pacemaker: Secondary | ICD-10-CM

## 2022-03-02 DIAGNOSIS — R601 Generalized edema: Secondary | ICD-10-CM | POA: Diagnosis not present

## 2022-03-02 HISTORY — PX: RIGHT HEART CATH: CATH118263

## 2022-03-02 LAB — POCT I-STAT 7, (LYTES, BLD GAS, ICA,H+H)
Acid-base deficit: 5 mmol/L — ABNORMAL HIGH (ref 0.0–2.0)
Acid-base deficit: 5 mmol/L — ABNORMAL HIGH (ref 0.0–2.0)
Bicarbonate: 18.5 mmol/L — ABNORMAL LOW (ref 20.0–28.0)
Bicarbonate: 18.7 mmol/L — ABNORMAL LOW (ref 20.0–28.0)
Calcium, Ion: 1.29 mmol/L (ref 1.15–1.40)
Calcium, Ion: 1.32 mmol/L (ref 1.15–1.40)
HCT: 28 % — ABNORMAL LOW (ref 39.0–52.0)
HCT: 28 % — ABNORMAL LOW (ref 39.0–52.0)
Hemoglobin: 9.5 g/dL — ABNORMAL LOW (ref 13.0–17.0)
Hemoglobin: 9.5 g/dL — ABNORMAL LOW (ref 13.0–17.0)
O2 Saturation: 66 %
O2 Saturation: 67 %
Potassium: 3.6 mmol/L (ref 3.5–5.1)
Potassium: 3.6 mmol/L (ref 3.5–5.1)
Sodium: 138 mmol/L (ref 135–145)
Sodium: 138 mmol/L (ref 135–145)
TCO2: 19 mmol/L — ABNORMAL LOW (ref 22–32)
TCO2: 20 mmol/L — ABNORMAL LOW (ref 22–32)
pCO2 arterial: 29.1 mmHg — ABNORMAL LOW (ref 32–48)
pCO2 arterial: 29.6 mmHg — ABNORMAL LOW (ref 32–48)
pH, Arterial: 7.41 (ref 7.35–7.45)
pH, Arterial: 7.411 (ref 7.35–7.45)
pO2, Arterial: 33 mmHg — CL (ref 83–108)
pO2, Arterial: 33 mmHg — CL (ref 83–108)

## 2022-03-02 LAB — COMPREHENSIVE METABOLIC PANEL
ALT: 112 U/L — ABNORMAL HIGH (ref 0–44)
AST: 61 U/L — ABNORMAL HIGH (ref 15–41)
Albumin: 3 g/dL — ABNORMAL LOW (ref 3.5–5.0)
Alkaline Phosphatase: 375 U/L — ABNORMAL HIGH (ref 38–126)
Anion gap: 9 (ref 5–15)
BUN: 66 mg/dL — ABNORMAL HIGH (ref 8–23)
CO2: 19 mmol/L — ABNORMAL LOW (ref 22–32)
Calcium: 9 mg/dL (ref 8.9–10.3)
Chloride: 106 mmol/L (ref 98–111)
Creatinine, Ser: 3.12 mg/dL — ABNORMAL HIGH (ref 0.61–1.24)
GFR, Estimated: 18 mL/min — ABNORMAL LOW (ref >60)
Glucose, Bld: 92 mg/dL (ref 70–99)
Potassium: 3.5 mmol/L (ref 3.5–5.1)
Sodium: 134 mmol/L — ABNORMAL LOW (ref 135–145)
Total Bilirubin: 1.6 mg/dL — ABNORMAL HIGH (ref 0.3–1.2)
Total Protein: 5.8 g/dL — ABNORMAL LOW (ref 6.5–8.1)

## 2022-03-02 LAB — CBC
HCT: 22.4 % — ABNORMAL LOW (ref 39.0–52.0)
Hemoglobin: 8.1 g/dL — ABNORMAL LOW (ref 13.0–17.0)
MCH: 25.7 pg — ABNORMAL LOW (ref 26.0–34.0)
MCHC: 36.2 g/dL — ABNORMAL HIGH (ref 30.0–36.0)
MCV: 71.1 fL — ABNORMAL LOW (ref 80.0–100.0)
Platelets: 273 10*3/uL (ref 150–400)
RBC: 3.15 MIL/uL — ABNORMAL LOW (ref 4.22–5.81)
RDW: 15.9 % — ABNORMAL HIGH (ref 11.5–15.5)
WBC: 6 10*3/uL (ref 4.0–10.5)
nRBC: 0 % (ref 0.0–0.2)

## 2022-03-02 SURGERY — RIGHT HEART CATH
Anesthesia: LOCAL

## 2022-03-02 MED ORDER — ACETAMINOPHEN 325 MG PO TABS: 650.0000 mg | ORAL_TABLET | ORAL | Status: DC | PRN

## 2022-03-02 MED ORDER — SODIUM CHLORIDE 0.9% FLUSH
3.0000 mL | Freq: Two times a day (BID) | INTRAVENOUS | Status: DC
  Administered 2022-03-02 – 2022-03-04 (×5): 3 mL via INTRAVENOUS

## 2022-03-02 MED ORDER — SODIUM CHLORIDE 0.9% FLUSH
3.0000 mL | Freq: Two times a day (BID) | INTRAVENOUS | Status: DC
  Administered 2022-03-02: 3 mL via INTRAVENOUS

## 2022-03-02 MED ORDER — HYDRALAZINE HCL 20 MG/ML IJ SOLN: 10.0000 mg | INTRAMUSCULAR | Status: AC | PRN

## 2022-03-02 MED ORDER — SODIUM CHLORIDE 0.9% FLUSH: 3.0000 mL | INTRAVENOUS | Status: DC | PRN

## 2022-03-02 MED ORDER — SODIUM CHLORIDE 0.9 % IV SOLN: INTRAVENOUS | Status: DC

## 2022-03-02 MED ORDER — ONDANSETRON HCL 4 MG/2ML IJ SOLN: 4.0000 mg | Freq: Four times a day (QID) | INTRAMUSCULAR | Status: DC | PRN

## 2022-03-02 MED ORDER — SODIUM CHLORIDE 0.9 % IV SOLN: INTRAVENOUS | Status: AC

## 2022-03-02 MED ORDER — POLYSACCHARIDE IRON COMPLEX 150 MG PO CAPS
150.0000 mg | ORAL_CAPSULE | Freq: Every day | ORAL | Status: DC
  Administered 2022-03-02 – 2022-03-04 (×3): 150 mg via ORAL
  Filled 2022-03-02 (×3): qty 1

## 2022-03-02 MED ORDER — HEPARIN (PORCINE) IN NACL 1000-0.9 UT/500ML-% IV SOLN
INTRAVENOUS | Status: DC | PRN
  Administered 2022-03-02: 500 mL

## 2022-03-02 MED ORDER — SODIUM CHLORIDE 0.9 % IV SOLN: 250.0000 mL | INTRAVENOUS | Status: DC | PRN

## 2022-03-02 MED ORDER — LIDOCAINE HCL (PF) 1 % IJ SOLN
INTRAMUSCULAR | Status: DC | PRN
  Administered 2022-03-02: 2 mL via INTRADERMAL

## 2022-03-02 MED ORDER — BENZONATATE 100 MG PO CAPS
100.0000 mg | ORAL_CAPSULE | Freq: Three times a day (TID) | ORAL | Status: DC | PRN
  Administered 2022-03-02: 100 mg via ORAL
  Filled 2022-03-02: qty 1

## 2022-03-02 MED ORDER — LIDOCAINE HCL (PF) 1 % IJ SOLN
INTRAMUSCULAR | Status: AC
  Filled 2022-03-02: qty 30

## 2022-03-02 MED ORDER — CHLORHEXIDINE GLUCONATE CLOTH 2 % EX PADS
6.0000 | MEDICATED_PAD | Freq: Every day | CUTANEOUS | Status: DC
  Administered 2022-03-02 – 2022-03-04 (×3): 6 via TOPICAL

## 2022-03-02 MED ORDER — LABETALOL HCL 5 MG/ML IV SOLN: 10.0000 mg | INTRAVENOUS | Status: AC | PRN

## 2022-03-02 SURGICAL SUPPLY — 3 items
CATH BALLN WEDGE 5F 110CM (CATHETERS) IMPLANT
PACK CARDIAC CATHETERIZATION (CUSTOM PROCEDURE TRAY) IMPLANT
SHEATH GLIDE SLENDER 4/5FR (SHEATH) IMPLANT

## 2022-03-02 NOTE — Evaluation (Signed)
Occupational Therapy Evaluation Patient Details Name: [name] MRN: [id number] DOB: [birth year] Today's Date: 03/02/2022   History of Present Illness 89 yo male presents to [facility] on 2/20 with acute renal failure superimposed on CKD IV. Pt scheduled for cardiac cath today (2/23). PMH includes CAD s/p CABG, HFpEF (EF 55-60%), severe aortic stenosis s/p TAVR (02/06/2022), symptomatic bradycardia s/p PPM, CKD stage IIIb, anemia of chronic disease, HTN, HLD, prostate cancer.   Clinical Impression   Pt admitted with the above diagnosis and has the deficits listed below. Pt is not far off of his baseline adl skill level but is very limited by activity tolerance.  Feel pt would benefit from further acute OT to continue to work on [place] while in hospital to avoid getting weaker while hospitalized. Pt's wife mentioned this pt may return to cardiac rehab which may be ideal for building endurance.  Will continue to assess for further OT needs after d/c.  Pt able to complete needed adls but needs to be more efficient when doing so.  Will continue to follow.      Recommendations for follow up therapy are one component of a multi-disciplinary discharge planning process, led by the attending physician.  Recommendations may be updated based on patient status, additional functional criteria and insurance authorization.   Follow Up Recommendations  Other (comment) (wife spoke of cardiac rehab for pt.)     Assistance Recommended at Discharge Frequent or constant Supervision/Assistance  Patient can return home with the following A little help with walking and/or transfers;A little help with bathing/dressing/bathroom;Assistance with cooking/housework;Direct supervision/assist for medications management;Direct supervision/assist for financial management;Assist for transportation;Help with stairs or ramp for entrance    Functional Status Assessment  Patient has had a recent decline in their functional status and  demonstrates the ability to make significant improvements in function in a reasonable and predictable amount of time.  Equipment Recommendations  None recommended by OT    Recommendations for Other Services       Precautions / Restrictions Precautions Precautions: Fall Restrictions Weight Bearing Restrictions: No      Mobility Bed Mobility Overal bed mobility: Needs Assistance Bed Mobility: Supine to Sit     Supine to sit: Supervision, HOB elevated     General bed mobility comments: No hands on assist required to get to full sitting position.    Transfers Overall transfer level: Needs assistance Equipment used: Straight cane Transfers: Sit to/from Stand Sit to Stand: Min guard           General transfer comment: Pt is used to standing near bed/chair and pushing back of knees against bed/chair to stabilize before walking. Pt did not need physical assist to rise or steady.  Pt had cane in r hand.      Balance Overall balance assessment: Needs assistance Sitting-balance support: No upper extremity supported Sitting balance-Leahy Scale: Good     Standing balance support: Single extremity supported Standing balance-Leahy Scale: Poor Standing balance comment: Pt reliant on outside support to stand. Pt uses furniture and cane at home in tight spaces and when outside home uses cane and wifes hand.                           ADL either performed or assessed with clinical judgement   ADL Overall ADL's : Needs assistance/impaired Eating/Feeding: NPO Eating/Feeding Details (indicate cue type and reason): is typically independent feeding self. Grooming: Wash/dry hands;Wash/dry face;Oral care;Min guard;Standing Grooming Details (indicate  cue type and reason): Pt stood at sink to groom with min guard for balance. Upper Body Bathing: Set up;Sitting   Lower Body Bathing: Minimal assistance;Sit to/from stand;Cueing for compensatory techniques Lower Body Bathing  Details (indicate cue type and reason): assist only for balance in standing due to weakness. Upper Body Dressing : Set up;Sitting   Lower Body Dressing: Minimal assistance;Sit to/from stand;Cueing for compensatory techniques Lower Body Dressing Details (indicate cue type and reason): Pt crosses legs in figure 4 to donn pants and socks/shoes. Toilet Transfer: Min guard;Ambulation;Grab bars;Comfort height toilet (cane) Toilet Transfer Details (indicate cue type and reason): Pt walked to bathroom with cane and min guard. Toileting- Clothing Manipulation and Hygiene: Minimal assistance;Sit to/from stand;Cueing for compensatory techniques       Functional mobility during ADLs: Min guard;Cane General ADL Comments: Pt doing fairly well with adls but fatigues quickly.  Will educate patient on energy conservation techinques to use during adls.     Vision Baseline Vision/History: 6 Macular Degeneration;1 Wears glasses Ability to See in Adequate Light: 1 Impaired Patient Visual Report: No change from baseline Vision Assessment?: Yes Eye Alignment: Within Functional Limits Ocular Range of Motion: Within Functional Limits Alignment/Gaze Preference: Within Defined Limits Tracking/Visual Pursuits: Able to track stimulus in all quads without difficulty Saccades: Within functional limits Convergence: Within functional limits Visual Fields: No apparent deficits Additional Comments: pt compensates for loss of central vision from macular degeneration     Perception     Praxis Praxis Praxis tested?: Within functional limits    Pertinent Vitals/Pain Pain Assessment Pain Assessment: No/denies pain     Hand Dominance Right   Extremity/Trunk Assessment Upper Extremity Assessment Upper Extremity Assessment: Overall WFL for tasks assessed   Lower Extremity Assessment Lower Extremity Assessment: Defer to PT evaluation   Cervical / Trunk Assessment Cervical / Trunk Assessment: Kyphotic    Communication Communication Communication: No difficulties   Cognition Arousal/Alertness: Awake/alert Behavior During Therapy: WFL for tasks assessed/performed Overall Cognitive Status: Impaired/Different from baseline Area of Impairment: Safety/judgement, Awareness, Problem solving                         Safety/Judgement: Decreased awareness of safety, Decreased awareness of deficits Awareness: Emergent Problem Solving: Slow processing, Difficulty sequencing, Requires verbal cues, Requires tactile cues General Comments: Pt appeared slightly clearer this am cognitively recalling he had been in bed for two days straight when not feeling well earlier this month.  Pt oriented to all except exact date.  Pt does not live alone.  Appears at times to have difficulty finding things/problem solving new sink set up. Feel this may be partly due to pts severe macular degeneration therefore things take more time due to compensating for vision.     General Comments  Pt generally weak from procedure on 1/30. Pt can complete adls but gets very fatigued from doing so.    Exercises     Shoulder Instructions      Home Living Family/patient expects to be discharged to:: Private residence Living Arrangements: Spouse/significant other Available Help at Discharge: Family Type of Home: House Home Access: Stairs to enter Technical brewer of Steps: 2 Entrance Stairs-Rails: Right;Left Home Layout: One level     Bathroom Shower/Tub: Occupational psychologist: [place]: [place] (2 wheels);Cane - single point          Prior Functioning/Environment Prior Level of Function : Independent/Modified Independent  Mobility Comments: using cane or nothing inside home and cane outside home with wife holding hand ADLs Comments: took 1.5 hours to do all adls but does not need assist. Wife supervises getting in/out of  shower.        OT Problem List: Decreased activity tolerance;Decreased safety awareness;Impaired balance (sitting and/or standing)      OT Treatment/Interventions: Self-care/ADL training;Therapeutic activities;DME and/or AE instruction;Balance training    OT Goals(Current goals can be found in the care plan section) Acute Rehab OT Goals Patient Stated Goal: to go home OT Goal Formulation: With patient Time For Goal Achievement: 03/16/22 Potential to Achieve Goals: Good ADL Goals Pt Will Perform Grooming: with supervision;standing Pt Will Perform Tub/Shower Transfer: with supervision;ambulating;Shower transfer Additional ADL Goal #1: Pt will walk to bathroom with cane and complete all toileting with supervision. Additional ADL Goal #2: Pt will gather all clothing and dress self with supervision and cane if needed. Additional ADL Goal #3: Pt will state two things he can do at home to conserve energy during adls without cuing.  OT Frequency: Min 2X/week    Co-evaluation              AM-PAC OT "6 Clicks" Daily Activity     Outcome Measure Help from another person eating meals?: Total Help from another person taking care of personal grooming?: None Help from another person toileting, which includes using toliet, bedpan, or urinal?: A Little Help from another person bathing (including washing, rinsing, drying)?: A Little Help from another person to put on and taking off regular upper body clothing?: None Help from another person to put on and taking off regular lower body clothing?: A Little 6 Click Score: 18   End of Session Equipment Utilized During Treatment: Other (comment) (cane) Nurse Communication: Mobility status  Activity Tolerance: Patient tolerated treatment well Patient left: in chair;with call bell/phone within reach;with family/visitor present  OT Visit Diagnosis: Unsteadiness on feet (R26.81)                Time: [id number] OT Time Calculation (min): 30  min Charges:  OT General Charges $OT Visit: 1 Visit OT Evaluation $OT Eval Moderate Complexity: 1 Mod OT Treatments $Self Care/Home Management : 8-22 mins  Glenford Peers 03/02/2022, 8:51 AM

## 2022-03-02 NOTE — H&P (View-Only) (Signed)
Subjective:  Still with weakness and dyspnea   Objective:  Vitals:   03/01/22 0830 03/01/22 1547 03/01/22 2003 03/02/22 0543  BP: 138/60 (!) 116/50 (!) 123/51 138/60  Pulse: 63 67 60 65  Resp: '16 16 17 17  '$ Temp: 97.9 F (36.6 C) 97.6 F (36.4 C) 97.8 F (36.6 C) 98.8 F (37.1 C)  TempSrc: Oral Oral Oral Oral  SpO2: 97% 97% 95% 96%  Weight:      Height:        Intake/Output from previous day:  Intake/Output Summary (Last 24 hours) at 03/02/2022 0824 Last data filed at 03/02/2022 [id number] Gross per 24 hour  Intake --  Output 2325 ml  Net -2325 ml    Physical Exam:  Frail elderly male  JVP elevated with V wave  Decreased BS right base SEM through TAVR valve no AR  Post sternotomy PPM under left clavicle  Pedal edema  Peripheral neuropathy  Abdomen non acute   Lab Results: Basic Metabolic Panel: Recent Labs    02/28/22 0353 03/01/22 0529  NA 131* 134*  K 3.5 3.5  CL 104 106  CO2 18* 19*  GLUCOSE 93 84  BUN 68* 67*  CREATININE 3.23* 3.31*  CALCIUM 9.3 9.0  MG 2.4  --    Liver Function Tests: Recent Labs    02/28/22 0353 03/01/22 0529  AST 162* 114*  ALT 174* 146*  ALKPHOS 453* 491*  BILITOT 2.3* 1.8*  PROT 5.8* 5.3*  ALBUMIN 3.0* 2.9*   Recent Labs    02/28/22 0353 03/01/22 0529  LIPASE 1,303* 785*   CBC: Recent Labs    02/27/22 1958 02/28/22 0353 03/01/22 0529 03/02/22 0703  WBC 6.8 5.8 6.2 6.0  NEUTROABS 5.4 4.4  --   --   HGB 8.7* 8.2* 7.9* 8.1*  HCT 25.3* 23.7* 22.2* 22.4*  MCV 74.0* 72.5* 70.3* 71.1*  PLT 261 236 205 273    Anemia Panel: Recent Labs    03/01/22 0528  FERRITIN 331  TIBC 235*  IRON 48    Imaging: US RENAL  Result Date: 03/01/2022 CLINICAL DATA:  Acute renal failure EXAM: RENAL / URINARY TRACT ULTRASOUND COMPLETE COMPARISON:  CT from 02/27/2022 FINDINGS: Right Kidney: Renal measurements: 10.3 x 4.7 x 4.8 cm. = volume: 121 mL. Multiple cysts are identified within the right kidney. The largest of these  measures 4.5 cm in greatest dimension and is simple in nature. No further follow-up is recommended. No hydronephrosis is noted. Left Kidney: Renal measurements: 10.3 x 5.2 x 4.5 cm. = volume: 125 mL. No hydronephrosis is noted. A 1.1 cm simple cyst is noted within the left kidney stable from the prior exam. No further follow-up is recommended. Bladder: Decompressed Other: None. IMPRESSION: Bilateral simple renal cysts similar to that seen on prior CT examination. No follow-up is recommended. No other focal abnormality is noted. Electronically Signed   By: Inez Catalina M.D.   On: 03/01/2022 18:28   NM Hepato W/EF  Result Date: 02/28/2022 CLINICAL DATA:  Elevated liver function tests EXAM: NUCLEAR MEDICINE HEPATOBILIARY IMAGING WITH GALLBLADDER EF TECHNIQUE: Sequential images of the abdomen were obtained out to 60 minutes following intravenous administration of radiopharmaceutical. After oral ingestion of Ensure, gallbladder ejection fraction was determined. At 60 min, normal ejection fraction is greater than 33%. RADIOPHARMACEUTICALS:  7.8 mCi Tc-42m Choletec IV COMPARISON:  CT 02/27/2022 and ultrasound FINDINGS: Prompt uptake and biliary excretion of activity by the liver is seen. Gallbladder activity is visualized, consistent with  patency of cystic duct. Biliary activity passes into small bowel, consistent with patent common bile duct. Calculated gallbladder ejection fraction is 36%. (Normal gallbladder ejection fraction with Ensure is greater than 33%.) IMPRESSION: No common duct or cystic duct obstruction. Low normal gallbladder ejection fraction Electronically Signed   By: Jill Side M.D.   On: 02/28/2022 10:42    Cardiac Studies:  ECG: A pacing    Telemetry:  Echo: IMPRESSIONS     1. Poor acoustic windows limit study Difficlt to see endocardium in all  views.. Left ventricular ejection fraction, by estimation, is 50 to 55%.  The left ventricle has low normal function. The left ventricle has no   regional wall motion abnormalities.  There is severe asymmetric left ventricular hypertrophy.   2. Right ventricular systolic function is normal. The right ventricular  size is normal. There is moderately elevated pulmonary artery systolic  pressure.   3. Left atrial size was moderately dilated.   4. Right atrial size was moderately dilated.   5. Mild mitral valve regurgitation. Moderate mitral annular  calcification.   6. S/p TAVR (26 mm Sapien prosthesis, procedure date 02/06/22). Peak and  mean gradients through the vavle are 15 and 8 mm Hg respectively. NO  signficant change from echo in 1.30. 2024.. The aortic valve has been  repaired/replaced. Aortic valve  regurgitation is not visualized. There is a 26 mm Sapien prosthetic (TAVR)  valve present in the aortic position. Procedure Date: 02/06/2022.   7. Aortic dilatation noted. There is mild dilatation of the ascending  aorta, measuring 39 mm.   8. The inferior vena cava is dilated in size with <50% respiratory  variability, suggesting right atrial pressure of 15 mmHg.   Medications:    heparin  5,000 Units Subcutaneous Q8H      Assessment/Plan:   TAVR:  implant on 1/30 with 26 mm Sapien 3 valve Normal function no signs of SBE or PVL normal gradient CABG:  no angina ECG stable Abnormal Labs:  no cardiac etiology noted for elevated LFt;s and acute on chronic renal failure His EF is 50-55% with normal RV function TTE would not suggest hepatorenal With persistent elevation in Cr/LFTls and nephrology w/u GI w/u negative so far Discussed utility of right heart cath to assess filling pressures and guide further Rx. Risks including bleeding discussed Orders written no contrast simple right heart cath latter today    W/u for anemia, A/CRF, elevated LFTls per primary service nephrology and GI   Note patient is DNR   Jenkins Rouge 03/02/2022, 8:24 AM

## 2022-03-02 NOTE — Interval H&P Note (Signed)
History and Physical Interval Note:  03/02/2022 9:33 AM  [name]  has presented today for surgery, with the diagnosis of chf.  The various methods of treatment have been discussed with the patient and family. After consideration of risks, benefits and other options for treatment, the patient has consented to  Procedure(s): RIGHT HEART CATH (N/A) as a surgical intervention.  The patient's history has been reviewed, patient examined, no change in status, stable for surgery.  I have reviewed the patient's chart and labs.  Questions were answered to the patient's satisfaction.     Quay Burow

## 2022-03-02 NOTE — Progress Notes (Signed)
Subjective:  Still with weakness and dyspnea   Objective:  Vitals:   03/01/22 0830 03/01/22 1547 03/01/22 2003 03/02/22 0543  BP: 138/60 (!) 116/50 (!) 123/51 138/60  Pulse: 63 67 60 65  Resp: '16 16 17 17  '$ Temp: 97.9 F (36.6 C) 97.6 F (36.4 C) 97.8 F (36.6 C) 98.8 F (37.1 C)  TempSrc: Oral Oral Oral Oral  SpO2: 97% 97% 95% 96%  Weight:      Height:        Intake/Output from previous day:  Intake/Output Summary (Last 24 hours) at 03/02/2022 0824 Last data filed at 03/02/2022 [id number] Gross per 24 hour  Intake --  Output 2325 ml  Net -2325 ml    Physical Exam:  Frail elderly male  JVP elevated with V wave  Decreased BS right base SEM through TAVR valve no AR  Post sternotomy PPM under left clavicle  Pedal edema  Peripheral neuropathy  Abdomen non acute   Lab Results: Basic Metabolic Panel: Recent Labs    02/28/22 0353 03/01/22 0529  NA 131* 134*  K 3.5 3.5  CL 104 106  CO2 18* 19*  GLUCOSE 93 84  BUN 68* 67*  CREATININE 3.23* 3.31*  CALCIUM 9.3 9.0  MG 2.4  --    Liver Function Tests: Recent Labs    02/28/22 0353 03/01/22 0529  AST 162* 114*  ALT 174* 146*  ALKPHOS 453* 491*  BILITOT 2.3* 1.8*  PROT 5.8* 5.3*  ALBUMIN 3.0* 2.9*   Recent Labs    02/28/22 0353 03/01/22 0529  LIPASE 1,303* 785*   CBC: Recent Labs    02/27/22 1958 02/28/22 0353 03/01/22 0529 03/02/22 0703  WBC 6.8 5.8 6.2 6.0  NEUTROABS 5.4 4.4  --   --   HGB 8.7* 8.2* 7.9* 8.1*  HCT 25.3* 23.7* 22.2* 22.4*  MCV 74.0* 72.5* 70.3* 71.1*  PLT 261 236 205 273    Anemia Panel: Recent Labs    03/01/22 0528  FERRITIN 331  TIBC 235*  IRON 48    Imaging: US RENAL  Result Date: 03/01/2022 CLINICAL DATA:  Acute renal failure EXAM: RENAL / URINARY TRACT ULTRASOUND COMPLETE COMPARISON:  CT from 02/27/2022 FINDINGS: Right Kidney: Renal measurements: 10.3 x 4.7 x 4.8 cm. = volume: 121 mL. Multiple cysts are identified within the right kidney. The largest of these  measures 4.5 cm in greatest dimension and is simple in nature. No further follow-up is recommended. No hydronephrosis is noted. Left Kidney: Renal measurements: 10.3 x 5.2 x 4.5 cm. = volume: 125 mL. No hydronephrosis is noted. A 1.1 cm simple cyst is noted within the left kidney stable from the prior exam. No further follow-up is recommended. Bladder: Decompressed Other: None. IMPRESSION: Bilateral simple renal cysts similar to that seen on prior CT examination. No follow-up is recommended. No other focal abnormality is noted. Electronically Signed   By: Inez Catalina M.D.   On: 03/01/2022 18:28   NM Hepato W/EF  Result Date: 02/28/2022 CLINICAL DATA:  Elevated liver function tests EXAM: NUCLEAR MEDICINE HEPATOBILIARY IMAGING WITH GALLBLADDER EF TECHNIQUE: Sequential images of the abdomen were obtained out to 60 minutes following intravenous administration of radiopharmaceutical. After oral ingestion of Ensure, gallbladder ejection fraction was determined. At 60 min, normal ejection fraction is greater than 33%. RADIOPHARMACEUTICALS:  7.8 mCi Tc-38m Choletec IV COMPARISON:  CT 02/27/2022 and ultrasound FINDINGS: Prompt uptake and biliary excretion of activity by the liver is seen. Gallbladder activity is visualized, consistent with  patency of cystic duct. Biliary activity passes into small bowel, consistent with patent common bile duct. Calculated gallbladder ejection fraction is 36%. (Normal gallbladder ejection fraction with Ensure is greater than 33%.) IMPRESSION: No common duct or cystic duct obstruction. Low normal gallbladder ejection fraction Electronically Signed   By: Jill Side M.D.   On: 02/28/2022 10:42    Cardiac Studies:  ECG: A pacing    Telemetry:  Echo: IMPRESSIONS     1. Poor acoustic windows limit study Difficlt to see endocardium in all  views.. Left ventricular ejection fraction, by estimation, is 50 to 55%.  The left ventricle has low normal function. The left ventricle has no   regional wall motion abnormalities.  There is severe asymmetric left ventricular hypertrophy.   2. Right ventricular systolic function is normal. The right ventricular  size is normal. There is moderately elevated pulmonary artery systolic  pressure.   3. Left atrial size was moderately dilated.   4. Right atrial size was moderately dilated.   5. Mild mitral valve regurgitation. Moderate mitral annular  calcification.   6. S/p TAVR (26 mm Sapien prosthesis, procedure date 02/06/22). Peak and  mean gradients through the vavle are 15 and 8 mm Hg respectively. NO  signficant change from echo in 1.30. 2024.. The aortic valve has been  repaired/replaced. Aortic valve  regurgitation is not visualized. There is a 26 mm Sapien prosthetic (TAVR)  valve present in the aortic position. Procedure Date: 02/06/2022.   7. Aortic dilatation noted. There is mild dilatation of the ascending  aorta, measuring 39 mm.   8. The inferior vena cava is dilated in size with <50% respiratory  variability, suggesting right atrial pressure of 15 mmHg.   Medications:    heparin  5,000 Units Subcutaneous Q8H      Assessment/Plan:   TAVR:  implant on 1/30 with 26 mm Sapien 3 valve Normal function no signs of SBE or PVL normal gradient CABG:  no angina ECG stable Abnormal Labs:  no cardiac etiology noted for elevated LFt;s and acute on chronic renal failure His EF is 50-55% with normal RV function TTE would not suggest hepatorenal With persistent elevation in Cr/LFTls and nephrology w/u GI w/u negative so far Discussed utility of right heart cath to assess filling pressures and guide further Rx. Risks including bleeding discussed Orders written no contrast simple right heart cath latter today    W/u for anemia, A/CRF, elevated LFTls per primary service nephrology and GI   Note patient is DNR   Jenkins Rouge 03/02/2022, 8:24 AM

## 2022-03-02 NOTE — Progress Notes (Addendum)
Initial Nutrition Assessment  DOCUMENTATION CODES:   Not applicable  INTERVENTION:  - Monitor PO intakes  NUTRITION DIAGNOSIS:   Inadequate oral intake related to poor appetite as evidenced by per patient/family report.  GOAL:   Patient will meet greater than or equal to 90% of their needs  MONITOR:   PO intake  REASON FOR ASSESSMENT:   Malnutrition Screening Tool    ASSESSMENT:   89 y.o. male admits related to worsening renal failure and elevated LFTs. PMH reviewed: HF, HTN, CKD stage IV. Pt is currently receiving medical management related to acute renal failure superimposed on stage 4 CKD.  Meds reviewed. Labs reviewed: Na low, BUN/Creatinine elevated; LFTs still elevated but trending down.   Pt was NPO at time of assessment for procedure. Pt had just returned from procedure at time of assessment. He reports that his appetite is fair at baseline. He denies any significant wt loss recently. Wts stable over the past year. Pt denies any nutrition supplements at this time. RD will add Renal MVI for now. Will continue to monitor PO intakes.   NUTRITION - FOCUSED PHYSICAL EXAM:  Flowsheet Row Most Recent Value  Orbital Region Moderate depletion  Upper Arm Region Moderate depletion  Thoracic and Lumbar Region Unable to assess  Buccal Region Moderate depletion  Temple Region Moderate depletion  Clavicle Bone Region Moderate depletion  Clavicle and Acromion Bone Region Unable to assess  Scapular Bone Region Unable to assess  Dorsal Hand Unable to assess  Patellar Region Unable to assess  Anterior Thigh Region Unable to assess  Posterior Calf Region Unable to assess  Edema (RD Assessment) None  Hair Reviewed  Eyes Reviewed  Mouth Reviewed  Skin Reviewed  Nails Unable to assess       Diet Order:   Diet Order             Diet regular Room service appropriate? Yes; Fluid consistency: Thin  Diet effective now                   EDUCATION NEEDS:   Not  appropriate for education at this time  Skin:  Skin Assessment: Reviewed RN Assessment  Last BM:  PTA  Height:   Ht Readings from Last 1 Encounters:  02/27/22 '5\' 10"'$  (1.778 m)    Weight:   Wt Readings from Last 1 Encounters:  02/27/22 70.8 kg    Ideal Body Weight:     BMI:  Body mass index is 22.38 kg/m.  Estimated Nutritional Needs:   Kcal:  [id number] kcals  Protein:  90-105 gm  Fluid:  >/= 1.7 L  [name], RD, LDN, CNSC.

## 2022-03-02 NOTE — Progress Notes (Signed)
[facility] Progress Note  Name: [name] MRN: [id number] DOB: Dec 12, 1926  Chief Complaint:  Abnormal labs and swelling   Subjective:  he had 2.3 liters UOP over 2/22 charted.  Foley was placed this admission.  He does follow with urology outpatient.  His wife and a close family friend were updated at bedside.  His leg swelling is much improved.  He doesn't take diuretics every day - uses lasix on a PRN basis at home and states that he always checks in with a provider about this  Review of systems:  Denies shortness of breath  Denies n/v Leg swelling better  Has a cough   ---------------- Background on consult:  [name] is a/an 89 y.o. male with a past medical history of CKD4,. HTN, dCHF/HFpEF, severe AS s/p TAVR 02/06/22, h/o prostate ca s/p prostatectomy, 2nd degree AVB who presents to [facility] with AKI and elevated LFTs. Found to have anasarca here, diuresed with bumex. Also found to have urinary retention, s/p foley. Patient seen and examined bedside. Family and wife at bedside as well.  Patient reports that his breathing was never really an issue. He does report that his swelling has improved since being here. No other complaints at this time.  He has already made 1600cc of urine. Weight is down from 74.4kg to 70.8kg.   Intake/Output Summary (Last 24 hours) at 03/02/2022 1344 Last data filed at 03/02/2022 [id number] Gross per 24 hour  Intake --  Output 2325 ml  Net -2325 ml    Vitals:  Vitals:   03/01/22 0830 03/01/22 1547 03/01/22 2003 03/02/22 0543  BP: 138/60 (!) 116/50 (!) 123/51 138/60  Pulse: 63 67 60 65  Resp: '16 16 17 17  '$ Temp: 97.9 F (36.6 C) 97.6 F (36.4 C) 97.8 F (36.6 C) 98.8 F (37.1 C)  TempSrc: Oral Oral Oral Oral  SpO2: 97% 97% 95% 96%  Weight:      Height:         Physical Exam:  General adult male in bed in no acute distress HEENT normocephalic atraumatic extraocular movements intact sclera anicteric Neck supple trachea  midline Lungs clear to auscultation bilaterally normal work of breathing at rest on room air Heart S1S2 no rub Abdomen soft nontender nondistended Extremities no pitting edema  Psych normal mood and affect Neuro alert and oriented x 3 provides hx and follows commands  Medications reviewed   Labs:     Latest Ref Rng & Units 03/02/2022    7:03 AM 03/01/2022    5:29 AM 02/28/2022    3:53 AM  BMP  Glucose 70 - 99 mg/dL 92  84  93   BUN 8 - 23 mg/dL 66  67  68   Creatinine 0.61 - 1.24 mg/dL 3.12  3.31  3.23   Sodium 135 - 145 mmol/L 134  134  131   Potassium 3.5 - 5.1 mmol/L 3.5  3.5  3.5   Chloride 98 - 111 mmol/L 106  106  104   CO2 22 - 32 mmol/L '19  19  18   '$ Calcium 8.9 - 10.3 mg/dL 9.0  9.0  9.3      Assessment/Plan:    AKI on CKD4 -followed by Dr. Joelyn Oms. Baseline Cr typically in the 2's -AKI likely multifactorial: bladder outlet obstruction, decreased EABV in the context of vascular congestion.  UA 2/21 neg protein, no blood, no micro.  10.3 cm kidneys without hydro and simple renal cysts - Continue foley  -  Diuretics have been held - would remain off for now (uses only on a PRN basis at home)   Acute urinary retention -now with foley   Anasarca - improved  - Diuretics on hold - Check daily weights.  These are ordered but last recorded weight is from 2/20 - reordered   Elevated LFT's -secondary to congestive hepatopathy? Improved with supportive care   HFpEF, recent TAVR -diuretics on hold as above    HTN -controlled on current regimen    Metabolic acidosis -likely secondary to AKI - If persistent can start bicarbonate    Anemia, microcytic - Iron is slightly low - start nu-iron daily as tolerated  - Transfusions per primary team discretion   Disposition - continue inpatient monitoring     Claudia Desanctis, MD 03/02/2022 2:22 PM

## 2022-03-02 NOTE — Progress Notes (Signed)
Progress Note   Patient: [name] [id number] DOB: 1926/04/08 DOA: 02/27/2022     1 DOS: the patient was seen and examined on 03/02/2022   Brief hospital course: 89 year old male history of diastolic heart failure, hypertension, CKD stage IV, recent TAVR for severe aortic stenosis on February 06, 2022, history of second-degree AV block status post pacemaker, prior prostate cancer status post prostatectomy who presents to the ER today from the PCP office secondary to worsening renal failure and elevated LFTs.   Assessment and Plan: * Acute renal failure superimposed on stage 4 chronic kidney disease ([place]) Observation med/surg bed. Pt with acute urinary retention. Will place foley catheter. Worsening Scr. Pt has soft tissue mass near prostatectomy bed. PET/CT in 12-2021 could not see any activity due to intense radiotracer activity next to bladder. Pt voided 120 ml. Post-void bladder scan was 301. With post void of 575cc after foley placed -Cont with foley cath. Good urine output since foley was placed -Appreciate assistance by Nephrology. Recommendation to continue to hold diuretics and continue foley cath -May consider adding bicarbonate if metabolic acidosis persists  Metabolic acidosis -Likely secondary to above renal disease   Acute urinary retention Continued wtih foley catheter. Pt is s/p prostatectomy. PSA normal at 0.88 Recommend pt f/u with [facility]. Pt follows Dr. Gilford Rile   Elevated LFTs -Pt with elevated LFTs. No pain. CT and RUQ show thickened gallbladder wall without cholelithiasis. There is a small amount of pericholecystic fluid.  -Pt without RUQ pain. -HIDA reviewed, no common duct or cystic duct obstruction. Low normal GB EF -Liver appears normal on CT and [place] -Hepatitis panel is negative -LFT's improving -Per GI, elevated LFT's likely related to heart failure. No need for further GI work up. GI has since signed off   Anasarca -Unclear what his  anasarca is from. Pt's appetite is quite poor and his serum protein and albumin have been dropping since his TAVR.  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   Pleural effusion on right -Given trial of diuresis, now held secondary to dry appearing membranes   DNR (do not resuscitate)/DNI(Do Not Intubate) -Admitting physican verified with pt and wife that he is a DNR/DNI.   (HFpEF) heart failure with preserved ejection fraction (HCC) Pt has been on lasix for about 12 months.  -Good urine output with bumex and foley cath -Bumex now on hold secondary to dry appearing membranes and persistently elevated Cr   S/P TAVR (transcatheter aortic valve replacement) Stable. Had echo 02-22-2022 that showed stable TAVR and LVEF 55%.   Essential hypertension Hold hydralazine and norvasc during diuresis.      Subjective: States feeling better. Eager to go home soon.  Physical Exam: Vitals:   03/01/22 0830 03/01/22 1547 03/01/22 2003 03/02/22 0543  BP: 138/60 (!) 116/50 (!) 123/51 138/60  Pulse: 63 67 60 65  Resp: '16 16 17 17  '$ Temp: 97.9 F (36.6 C) 97.6 F (36.4 C) 97.8 F (36.6 C) 98.8 F (37.1 C)  TempSrc: Oral Oral Oral Oral  SpO2: 97% 97% 95% 96%  Weight:      Height:       General exam: Awake, laying in bed, in nad Respiratory system: Normal respiratory effort, no wheezing Cardiovascular system: regular rate, s1, s2 Gastrointestinal system: Soft, nondistended, positive BS Central nervous system: CN2-12 grossly intact, strength intact Extremities: Perfused, no clubbing Skin: Normal skin turgor, no notable skin lesions seen Psychiatry: Mood normal // no visual  hallucinations   Data Reviewed:  Labs reviewed: Na 134, K 3.5, Cr 3.12   Family Communication: Pt in room, family at bedside  Disposition: Status is: Inpatient Continue inpatient stay because: Severity of illness  Planned Discharge Destination:  Unclear at this  time    Author: Marylu Lund, MD 03/02/2022 4:43 PM  For on call review [email or web address].

## 2022-03-02 NOTE — Progress Notes (Signed)
Progress Note   Subjective  Chief Complaint: Elevated LFTs  This morning patient is found sleeping after a right heart cath earlier today, per his wife he is doing well, no new GI complaints or concerns.    Objective   Vital signs in last 24 hours: Temp:  [97.6 F (36.4 C)-98.8 F (37.1 C)] 98.8 F (37.1 C) (02/23 0543) Pulse Rate:  [60-67] 65 (02/23 0543) Resp:  [16-17] 17 (02/23 0543) BP: (116-138)/(50-60) 138/60 (02/23 0543) SpO2:  [95 %-97 %] 96 % (02/23 0543)   General:    Elderly white male in NAD Heart:  Regular rate and rhythm; no murmurs Lungs: Respirations even and unlabored, lungs CTA bilaterally Abdomen:  Soft, nontender and nondistended. Normal bowel sounds. Psych:  Asleep  Intake/Output from previous day: 02/22 0701 - 02/23 0700 In: -  Out: 2325 [Urine:2325]   Lab Results: Recent Labs    02/28/22 0353 03/01/22 0529 03/02/22 0703  WBC 5.8 6.2 6.0  HGB 8.2* 7.9* 8.1*  HCT 23.7* 22.2* 22.4*  PLT 236 205 273   BMET Recent Labs    02/28/22 0353 03/01/22 0529 03/02/22 0703  NA 131* 134* 134*  K 3.5 3.5 3.5  CL 104 106 106  CO2 18* 19* 19*  GLUCOSE 93 84 92  BUN 68* 67* 66*  CREATININE 3.23* 3.31* 3.12*  CALCIUM 9.3 9.0 9.0      Latest Ref Rng & Units 03/02/2022    7:03 AM 03/01/2022    5:29 AM 02/28/2022    3:53 AM  Hepatic Function  Total Protein 6.5 - 8.1 g/dL 5.8  5.3  5.8   Albumin 3.5 - 5.0 g/dL 3.0  2.9  3.0   AST 15 - 41 U/L 61  114  162   ALT 0 - 44 U/L 112  146  174   Alk Phosphatase 38 - 126 U/L 375  491  453   Total Bilirubin 0.3 - 1.2 mg/dL 1.6  1.8  2.3       Studies/Results: CARDIAC CATHETERIZATION  Result Date: 03/02/2022 Images from the original result were not included. [name] is a 89 y.o. male  [id number] LOCATION:  FACILITY: [facility] PHYSICIAN: Quay Burow, M.D. 1926-06-21 DATE OF PROCEDURE:  03/02/2022 DATE OF DISCHARGE: Right heart catheterization History obtained from chart review.  89 year old married  Caucasian male referred for right heart cath by Dr. Johnsie Cancel to determine filling pressures.  He does have a history of severe asymmetric septal hypertrophy, remote pacemaker and TAVR in January of this year.  Echo reveals normal LV function with well-functioning aortic bioprosthesis.  The patient has elevated liver function tests and chronic renal insufficiency.  He was referred for right heart cath to determine his hemodynamics. HEMODYNAMICS:  1: Right atrial pressure-11/10 2: Right ventricular pressure-21/0 3: Pulmonary artery pressure-44/13, mean 25 4: Pulmonary artery wedge pressure-A-wave 15, V wave 13, mean 10 Cardiac output-8.1 L/min with an index of 4.3 L/min/m. IMPRESSION:[name] right heart cath suggested normal filling pressures.  He has mildly elevated pulmonary artery pressures. Quay Burow. MD, [facility] 03/02/2022 10:14 AM    US RENAL  Result Date: 03/01/2022 CLINICAL DATA:  Acute renal failure EXAM: RENAL / URINARY TRACT ULTRASOUND COMPLETE COMPARISON:  CT from 02/27/2022 FINDINGS: Right Kidney: Renal measurements: 10.3 x 4.7 x 4.8 cm. = volume: 121 mL. Multiple cysts are identified within the right kidney. The largest of these measures 4.5 cm in greatest dimension and is simple in nature. No further follow-up is recommended.  No hydronephrosis is noted. Left Kidney: Renal measurements: 10.3 x 5.2 x 4.5 cm. = volume: 125 mL. No hydronephrosis is noted. A 1.1 cm simple cyst is noted within the left kidney stable from the prior exam. No further follow-up is recommended. Bladder: Decompressed Other: None. IMPRESSION: Bilateral simple renal cysts similar to that seen on prior CT examination. No follow-up is recommended. No other focal abnormality is noted. Electronically Signed   By: Inez Catalina M.D.   On: 03/01/2022 18:28       Assessment / Plan:   Assessment: 1.  Elevated LFTs: LFTs are continuing to trend down, workup of gallbladder with no choledocholithiasis and patient with no  abdominal pain making hepatobiliary cause less likely; still likely related to heart failure 2.  Elevated lipase: No sign of pancreatitis on imaging 3.  Recent TAVR: In January, echo 02/22/2022 with stable TAVR and LVEF 55% 4.  Acute renal failure superimposed on stage IV chronic kidney disease 5.  Anasarca 6.  AV block, second-degree: Status post pacemaker in March 2014 7.  Heart failure preserved ejection fraction  Plan: 1.  LFTs continue to trend down slowly.  There is no need for further workup at this point.  We will go ahead and sign off.  Please call us if something changes or you need further assistance.   LOS: 1 day   [name]  03/02/2022, 1:15 PM

## 2022-03-03 ENCOUNTER — Inpatient Hospital Stay (HOSPITAL_COMMUNITY): Payer: Medicare Other

## 2022-03-03 DIAGNOSIS — Z952 Presence of prosthetic heart valve: Secondary | ICD-10-CM | POA: Diagnosis not present

## 2022-03-03 DIAGNOSIS — R601 Generalized edema: Secondary | ICD-10-CM | POA: Diagnosis not present

## 2022-03-03 DIAGNOSIS — N179 Acute kidney failure, unspecified: Secondary | ICD-10-CM | POA: Diagnosis not present

## 2022-03-03 DIAGNOSIS — I5032 Chronic diastolic (congestive) heart failure: Secondary | ICD-10-CM | POA: Diagnosis not present

## 2022-03-03 DIAGNOSIS — Z950 Presence of cardiac pacemaker: Secondary | ICD-10-CM | POA: Diagnosis not present

## 2022-03-03 DIAGNOSIS — R7989 Other specified abnormal findings of blood chemistry: Secondary | ICD-10-CM | POA: Diagnosis not present

## 2022-03-03 LAB — COMPREHENSIVE METABOLIC PANEL
ALT: 89 U/L — ABNORMAL HIGH (ref 0–44)
AST: 39 U/L (ref 15–41)
Albumin: 3.1 g/dL — ABNORMAL LOW (ref 3.5–5.0)
Alkaline Phosphatase: 350 U/L — ABNORMAL HIGH (ref 38–126)
Anion gap: 10 (ref 5–15)
BUN: 61 mg/dL — ABNORMAL HIGH (ref 8–23)
CO2: 19 mmol/L — ABNORMAL LOW (ref 22–32)
Calcium: 9 mg/dL (ref 8.9–10.3)
Chloride: 106 mmol/L (ref 98–111)
Creatinine, Ser: 2.75 mg/dL — ABNORMAL HIGH (ref 0.61–1.24)
GFR, Estimated: 21 mL/min — ABNORMAL LOW (ref >60)
Glucose, Bld: 93 mg/dL (ref 70–99)
Potassium: 3.8 mmol/L (ref 3.5–5.1)
Sodium: 135 mmol/L (ref 135–145)
Total Bilirubin: 1.6 mg/dL — ABNORMAL HIGH (ref 0.3–1.2)
Total Protein: 6 g/dL — ABNORMAL LOW (ref 6.5–8.1)

## 2022-03-03 LAB — CBC
HCT: 24.9 % — ABNORMAL LOW (ref 39.0–52.0)
Hemoglobin: 8.6 g/dL — ABNORMAL LOW (ref 13.0–17.0)
MCH: 25.4 pg — ABNORMAL LOW (ref 26.0–34.0)
MCHC: 34.5 g/dL (ref 30.0–36.0)
MCV: 73.7 fL — ABNORMAL LOW (ref 80.0–100.0)
Platelets: 272 10*3/uL (ref 150–400)
RBC: 3.38 MIL/uL — ABNORMAL LOW (ref 4.22–5.81)
RDW: 16.3 % — ABNORMAL HIGH (ref 11.5–15.5)
WBC: 6.6 10*3/uL (ref 4.0–10.5)
nRBC: 0 % (ref 0.0–0.2)

## 2022-03-03 MED ORDER — TAMSULOSIN HCL 0.4 MG PO CAPS
0.4000 mg | ORAL_CAPSULE | Freq: Every day | ORAL | Status: DC
  Administered 2022-03-03: 0.4 mg via ORAL
  Filled 2022-03-03: qty 1

## 2022-03-03 MED ORDER — HYDRALAZINE HCL 50 MG PO TABS
50.0000 mg | ORAL_TABLET | Freq: Three times a day (TID) | ORAL | Status: DC
  Administered 2022-03-03 – 2022-03-04 (×4): 50 mg via ORAL
  Filled 2022-03-03 (×4): qty 1

## 2022-03-03 MED ORDER — GUAIFENESIN-DM 100-10 MG/5ML PO SYRP
5.0000 mL | ORAL_SOLUTION | ORAL | Status: DC | PRN
  Administered 2022-03-03 – 2022-03-04 (×3): 5 mL via ORAL
  Filled 2022-03-03 (×3): qty 10

## 2022-03-03 NOTE — Progress Notes (Signed)
Progress Note  Patient Name: [name] Date of Encounter: 03/03/2022  Primary Cardiologist: [name], MD   Subjective   Overnight he has felt a bit weak Wife notes rare hallucinations No CP, SOB, Palpitations. Notes pain at Left arm IV site  Inpatient Medications    Scheduled Meds:  Chlorhexidine Gluconate Cloth  6 each Topical Daily   heparin  5,000 Units Subcutaneous Q8H   iron polysaccharides  150 mg Oral Daily   sodium chloride flush  3 mL Intravenous Q12H   sodium chloride flush  3 mL Intravenous Q12H   tamsulosin  0.4 mg Oral QPC supper   Continuous Infusions:  sodium chloride     PRN Meds: sodium chloride, acetaminophen, benzonatate, ondansetron (ZOFRAN) IV, ondansetron **OR** [DISCONTINUED] ondansetron (ZOFRAN) IV, sodium chloride flush   Vital Signs    Vitals:   03/01/22 2003 03/02/22 0543 03/02/22 2021 03/03/22 0758  BP: (!) 123/51 138/60 (!) 149/64 (!) 155/58  Pulse: 60 65 65 64  Resp: '17 17 18 15  '$ Temp: 97.8 F (36.6 C) 98.8 F (37.1 C) 97.8 F (36.6 C) (!) 97.5 F (36.4 C)  TempSrc: Oral Oral Oral Oral  SpO2: 95% 96% 99% 98%  Weight:      Height:        Intake/Output Summary (Last 24 hours) at 03/03/2022 0820 Last data filed at 03/02/2022 2153 Gross per 24 hour  Intake --  Output 1100 ml  Net -1100 ml   Filed Weights   02/27/22 2005  Weight: 70.8 kg    Telemetry    NA - Personally Reviewed  Physical Exam   Gen: no distress, elderly male   Neck: No JVD Cardiac: No Rubs or Gallops, systolic murmur, RRR Respiratory: Clear to auscultation bilaterally, normal effort, normal  respiratory rate GI: Soft, nontender, non-distended  MS: No  edema;  moves all extremities Integument: small raised induration near left arm IV concerning for site infiltration, painful to tough Neuro:  At time of evaluation, alert and oriented to person/place/time/situation  Psych: Normal affect, patient feels ok   Labs    Chemistry Recent  Labs  Lab 03/01/22 0529 03/02/22 0703 03/02/22 0959 03/03/22 0418  NA 134* 134* 138  138 135  K 3.5 3.5 3.6  3.6 3.8  CL 106 106  --  106  CO2 19* 19*  --  19*  GLUCOSE 84 92  --  93  BUN 67* 66*  --  61*  CREATININE 3.31* 3.12*  --  2.75*  CALCIUM 9.0 9.0  --  9.0  PROT 5.3* 5.8*  --  6.0*  ALBUMIN 2.9* 3.0*  --  3.1*  AST 114* 61*  --  39  ALT 146* 112*  --  89*  ALKPHOS 491* 375*  --  350*  BILITOT 1.8* 1.6*  --  1.6*  GFRNONAA 17* 18*  --  21*  ANIONGAP 9 9  --  10     Hematology Recent Labs  Lab 03/01/22 0529 03/02/22 0703 03/02/22 0959 03/03/22 0418  WBC 6.2 6.0  --  6.6  RBC 3.16* 3.15*  --  3.38*  HGB 7.9* 8.1* 9.5*  9.5* 8.6*  HCT 22.2* 22.4* 28.0*  28.0* 24.9*  MCV 70.3* 71.1*  --  73.7*  MCH 25.0* 25.7*  --  25.4*  MCHC 35.6 36.2*  --  34.5  RDW 15.3 15.9*  --  16.3*  PLT 205 273  --  272    Cardiac EnzymesNo results for  input(s): "TROPONINI" in the last 168 hours. No results for input(s): "TROPIPOC" in the last 168 hours.   BNP Recent Labs  Lab 02/27/22 1958  BNP 490.4*     DDimer No results for input(s): "DDIMER" in the last 168 hours.   Radiology    CARDIAC CATHETERIZATION  Result Date: 03/02/2022 Images from the original result were not included. [name] is a 89 y.o. male  [id number] LOCATION:  FACILITY: [facility] PHYSICIAN: Quay Burow, M.D. 1926/01/26 DATE OF PROCEDURE:  03/02/2022 DATE OF DISCHARGE: Right heart catheterization History obtained from chart review.  89 year old married Caucasian male referred for right heart cath by Dr. Johnsie Cancel to determine filling pressures.  He does have a history of severe asymmetric septal hypertrophy, remote pacemaker and TAVR in January of this year.  Echo reveals normal LV function with well-functioning aortic bioprosthesis.  The patient has elevated liver function tests and chronic renal insufficiency.  He was referred for right heart cath to determine his hemodynamics. HEMODYNAMICS:  1: Right  atrial pressure-11/10 2: Right ventricular pressure-21/0 3: Pulmonary artery pressure-44/13, mean 25 4: Pulmonary artery wedge pressure-A-wave 15, V wave 13, mean 10 Cardiac output-8.1 L/min with an index of 4.3 L/min/m. IMPRESSION:Mr [name] right heart cath suggested normal filling pressures.  He has mildly elevated pulmonary artery pressures. Quay Burow. MD, [facility] 03/02/2022 10:14 AM    US RENAL  Result Date: 03/01/2022 CLINICAL DATA:  Acute renal failure EXAM: RENAL / URINARY TRACT ULTRASOUND COMPLETE COMPARISON:  CT from 02/27/2022 FINDINGS: Right Kidney: Renal measurements: 10.3 x 4.7 x 4.8 cm. = volume: 121 mL. Multiple cysts are identified within the right kidney. The largest of these measures 4.5 cm in greatest dimension and is simple in nature. No further follow-up is recommended. No hydronephrosis is noted. Left Kidney: Renal measurements: 10.3 x 5.2 x 4.5 cm. = volume: 125 mL. No hydronephrosis is noted. A 1.1 cm simple cyst is noted within the left kidney stable from the prior exam. No further follow-up is recommended. Bladder: Decompressed Other: None. IMPRESSION: Bilateral simple renal cysts similar to that seen on prior CT examination. No follow-up is recommended. No other focal abnormality is noted. Electronically Signed   By: Inez Catalina M.D.   On: 03/01/2022 18:28     Patient Profile     89 y.o. male  history of diastolic heart failure, hypertension, CKD stage IV, recent TAVR for severe aortic stenosis on February 06, 2022, history of second-degree AV block status post pacemaker, prior prostate cancer status post prostatectomy who presents to the ER from the PCP office secondary to worsening renal failure and elevated LFTs.    Assessment & Plan    CAD with prior CABG Severe AS s/p TAVR Chronic HFpEF CKD IV - Diuretics held given AKI and ULN RA pressures - Plans for PRN (agree with diuretics- after discussion with family DC diuretic dose may likely be Bumex 1 mg PR PRN SOB  and leg swelling) - Improved transaminitis and AKI with lasix hold   Given RHC and Kidney issues, would consider non cardiac etiologies of cough Discussed with family at length balance of CKD stage IV and volume status, and how his 2/14 infection could affect diuretic need  [employer] PPM - Appearance of Dual Chamber  If patient will be here for prolonged stay, he is having left arm pain around his IV site: he either needs removal or removal and new IV  [place] will sign off.   Medication Recommendations:  Bumex as  a PRN at DC unless nephrology has different recommendations Other recommendations (labs, testing, etc):  needs nephrology f/u Follow up as an outpatient:  Has 03/14/22 f/u    For questions or updates, please contact [facility] Please consult [email or web address] for contact info under Cardiology/STEMI.      Rudean Haskell, MD [place], [address] [place], [place] [address] [phone]  8:20 AM

## 2022-03-03 NOTE — Progress Notes (Signed)
Stated per wife that pt.coughed all night long . Did not want Tessalon Pearls last night since its was ineffective. Would like other medication to control the increase coughing. Pt.questioned how much longer he will be receiving Heparin shots SQ. , has never taken blood thinners and does not want more shots. Per wife pt. Has some confusion at night.  This RN will inform day shift nurse to follow up with doctor.

## 2022-03-03 NOTE — Progress Notes (Signed)
Progress Note   Patient: [name] [id number] DOB: 04-15-1926 DOA: 02/27/2022     2 DOS: the patient was seen and examined on 03/03/2022   Brief hospital course: 89 year old male history of diastolic heart failure, hypertension, CKD stage IV, recent TAVR for severe aortic stenosis on February 06, 2022, history of second-degree AV block status post pacemaker, prior prostate cancer status post prostatectomy who presents to the ER today from the PCP office secondary to worsening renal failure and elevated LFTs.   Assessment and Plan: * Acute renal failure superimposed on stage 4 chronic kidney disease ([place]) Observation med/surg bed. Pt with acute urinary retention. Will place foley catheter. Worsening Scr. Pt has soft tissue mass near prostatectomy bed. PET/CT in 12-2021 could not see any activity due to intense radiotracer activity next to bladder.  -foley cath was placed at time of admit given concerns of bladder outlet obstruction -Appreciate assistance by Nephrology. Recommendation to continue to hold diuretics and continue foley cath -Nephrology recs for possible voiding trial tomorrow. Pt follows Dr. Gilford Rile, recommend close f/u with [facility]  Metabolic acidosis -Likely secondary to above renal disease   Acute urinary retention Continued wtih foley catheter. Pt is s/p prostatectomy. PSA normal at 0.88 Recommend pt f/u with [facility]. Pt follows Dr. Gilford Rile -Per Nephrology, recs for possible voiding trial tomorrow   Elevated LFTs -Pt with elevated LFTs. No pain. CT and RUQ show thickened gallbladder wall without cholelithiasis. There is a small amount of pericholecystic fluid.  -Pt without RUQ pain. -HIDA reviewed, no common duct or cystic duct obstruction. Low normal GB EF -Liver appears normal on CT and [place] -Hepatitis panel is negative -LFT's improving -Per GI, elevated LFT's likely related to heart failure. No need for further GI work up. GI has since  signed off   Anasarca -Unclear what his anasarca is from. Pt's appetite is quite poor and his serum protein and albumin have been dropping since his TAVR.  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   Pleural effusion on right -Given trial of diuresis, now held secondary to dry appearing membranes   DNR (do not resuscitate)/DNI(Do Not Intubate) -Admitting physican verified with pt and wife that he is a DNR/DNI.   (HFpEF) heart failure with preserved ejection fraction (HCC) Pt has been on lasix for about 12 months.  -Good urine output with bumex and foley cath -Bumex now on hold. Pt reports taking diuretic on PRN basis normally   S/P TAVR (transcatheter aortic valve replacement) Stable. Had echo 02-22-2022 that showed stable TAVR and LVEF 55%.   Essential hypertension -norvasc on hold -BP trending up, will resume home hydralazine      Subjective: Reports feeling better  Physical Exam: Vitals:   03/01/22 2003 03/02/22 0543 03/02/22 2021 03/03/22 0758  BP: (!) 123/51 138/60 (!) 149/64 (!) 155/58  Pulse: 60 65 65 64  Resp: '17 17 18 15  '$ Temp: 97.8 F (36.6 C) 98.8 F (37.1 C) 97.8 F (36.6 C) (!) 97.5 F (36.4 C)  TempSrc: Oral Oral Oral Oral  SpO2: 95% 96% 99% 98%  Weight:      Height:       General exam: Conversant, in no acute distress Respiratory system: normal chest rise, clear, no audible wheezing Cardiovascular system: regular rhythm, s1-s2 Gastrointestinal system: Nondistended, nontender, pos BS Central nervous system: No seizures, no tremors Extremities: No cyanosis, no joint deformities Skin: No rashes, no pallor Psychiatry: Affect normal // no  auditory hallucinations   Data Reviewed:  Labs reviewed: Na 135, K 3.8, Cr 2.75   Family Communication: Pt in room, family at bedside  Disposition: Status is: Inpatient Continue inpatient stay because: Severity of illness  Planned Discharge Destination:  Unclear at this  time    Author: Marylu Lund, MD 03/03/2022 3:47 PM  For on call review [email or web address].

## 2022-03-03 NOTE — Progress Notes (Signed)
[facility] Progress Note  Name: [name] MRN: [id number] DOB: 1926-01-23  Chief Complaint:  Abnormal labs and swelling   Subjective:  he had 1.1 liters UOP over 2/23 charted.  He  states that he does not have trouble urinating at home.  Spoke with his wife at bedside.  He follows with Dr. Lovena Neighbours but they are not sure when his next appointment is.    Review of systems:  Denies shortness of breath  Denies n/v Leg swelling much better Has a cough   ---------------- Background on consult:  [name] is a/an 89 y.o. male with a past medical history of CKD4,. HTN, dCHF/HFpEF, severe AS s/p TAVR 02/06/22, h/o prostate ca s/p prostatectomy, 2nd degree AVB who presents to [facility] with AKI and elevated LFTs. Found to have anasarca here, diuresed with bumex. Also found to have urinary retention, s/p foley. Patient seen and examined bedside. Family and wife at bedside as well.  Patient reports that his breathing was never really an issue. He does report that his swelling has improved since being here. No other complaints at this time.  He has already made 1600cc of urine. Weight is down from 74.4kg to 70.8kg.   Intake/Output Summary (Last 24 hours) at 03/03/2022 0807 Last data filed at 03/02/2022 2153 Gross per 24 hour  Intake --  Output 1100 ml  Net -1100 ml    Vitals:  Vitals:   03/01/22 2003 03/02/22 0543 03/02/22 2021 03/03/22 0758  BP: (!) 123/51 138/60 (!) 149/64 (!) 155/58  Pulse: 60 65 65 64  Resp: '17 17 18 15  '$ Temp: 97.8 F (36.6 C) 98.8 F (37.1 C) 97.8 F (36.6 C) (!) 97.5 F (36.4 C)  TempSrc: Oral Oral Oral Oral  SpO2: 95% 96% 99% 98%  Weight:      Height:         Physical Exam:   General adult male in bed in no acute distress HEENT normocephalic atraumatic extraocular movements intact sclera anicteric Neck supple trachea midline Lungs clear to auscultation bilaterally normal work of breathing at rest on room air Heart S1S2 no  rub Abdomen soft nontender nondistended Extremities no edema  Psych normal mood and affect Neuro alert and oriented x 3 provides hx and follows commands GU foley catheter in place with urine   Medications reviewed   Labs:     Latest Ref Rng & Units 03/03/2022    4:18 AM 03/02/2022    9:59 AM 03/02/2022    7:03 AM  BMP  Glucose 70 - 99 mg/dL 93   92   BUN 8 - 23 mg/dL 61   66   Creatinine 0.61 - 1.24 mg/dL 2.75   3.12   Sodium 135 - 145 mmol/L 135  138    138  134   Potassium 3.5 - 5.1 mmol/L 3.8  3.6    3.6  3.5   Chloride 98 - 111 mmol/L 106   106   CO2 22 - 32 mmol/L 19   19   Calcium 8.9 - 10.3 mg/dL 9.0   9.0      Assessment/Plan:    AKI on CKD4 -followed by Dr. Joelyn Oms. Baseline Cr typically in the 2's -AKI likely multifactorial: bladder outlet obstruction and s/p diuretics for fluid overload.  UA 2/21 neg protein, no blood, no micro.  10.3 cm kidneys without hydro and simple renal cysts - Continue foley  - defer lasix today.  Note that diuretics are usually on a  PRN basis    Acute urinary retention - Note that he has a soft tissue mass near the prostatectomy bed.  He denies difficulty urinating previously - Start flomax 0.4 mg nightly  - Would continue foley catheter today and can try to remove tomorrow if ok with primary team.  Monitor for retention after foley removal.  Will need to follow up with urology     Fluid volume overload   - improved  - Diuretics are on hold and note he was previously on home lasix as needed  - Check daily weights.  These are ordered but last recorded weight is from 2/20.  Reinforced importance of daily weights    Elevated LFT's -secondary to congestive hepatopathy? Improved with supportive care   HFpEF, recent TAVR -diuretics as above   HTN -controlled on current regimen    Metabolic acidosis - likely secondary to AKI which is resolving - If persistent can start bicarbonate    Anemia, microcytic - Iron is slightly low -  started nu-iron daily as tolerated  - Transfusions per primary team discretion   Disposition - continue inpatient monitoring     Claudia Desanctis, MD 03/03/2022 8:40 AM

## 2022-03-04 DIAGNOSIS — R601 Generalized edema: Secondary | ICD-10-CM | POA: Diagnosis not present

## 2022-03-04 LAB — COMPREHENSIVE METABOLIC PANEL
ALT: 72 U/L — ABNORMAL HIGH (ref 0–44)
AST: 36 U/L (ref 15–41)
Albumin: 3.2 g/dL — ABNORMAL LOW (ref 3.5–5.0)
Alkaline Phosphatase: 348 U/L — ABNORMAL HIGH (ref 38–126)
Anion gap: 10 (ref 5–15)
BUN: 51 mg/dL — ABNORMAL HIGH (ref 8–23)
CO2: 21 mmol/L — ABNORMAL LOW (ref 22–32)
Calcium: 9.1 mg/dL (ref 8.9–10.3)
Chloride: 106 mmol/L (ref 98–111)
Creatinine, Ser: 2.82 mg/dL — ABNORMAL HIGH (ref 0.61–1.24)
GFR, Estimated: 20 mL/min — ABNORMAL LOW (ref >60)
Glucose, Bld: 110 mg/dL — ABNORMAL HIGH (ref 70–99)
Potassium: 3.9 mmol/L (ref 3.5–5.1)
Sodium: 137 mmol/L (ref 135–145)
Total Bilirubin: 1.4 mg/dL — ABNORMAL HIGH (ref 0.3–1.2)
Total Protein: 6.2 g/dL — ABNORMAL LOW (ref 6.5–8.1)

## 2022-03-04 LAB — LIPOPROTEIN A (LPA): Lipoprotein (a): 29.2 nmol/L (ref <75.0)

## 2022-03-04 MED ORDER — TAMSULOSIN HCL 0.4 MG PO CAPS: 0.4000 mg | ORAL_CAPSULE | Freq: Every day | ORAL | 0 refills | Status: DC

## 2022-03-04 MED ORDER — BENZONATATE 100 MG PO CAPS: 100.0000 mg | ORAL_CAPSULE | Freq: Three times a day (TID) | ORAL | 0 refills | Status: DC | PRN

## 2022-03-04 MED ORDER — POLYSACCHARIDE IRON COMPLEX 150 MG PO CAPS: 150.0000 mg | ORAL_CAPSULE | Freq: Every day | ORAL | 0 refills | Status: DC

## 2022-03-04 NOTE — Progress Notes (Addendum)
[facility] Progress Note  Name: [name] MRN: [id number] DOB: 11-22-1926  Chief Complaint:  Abnormal labs and swelling   Subjective:  Despite having a foley and making urine he had no urine output charted over 2/24.  He had a liter out - RN reviewed charting and this was charted under inputs.  They state that he has an appointment with Dr. Joelyn Oms on Tuesday, 2/27.   Review of systems:   Denies shortness of breath  Denies n/v Leg swelling much better  ---------------- Background on consult:  [name] is a/an 89 y.o. male with a past medical history of CKD4,. HTN, dCHF/HFpEF, severe AS s/p TAVR 02/06/22, h/o prostate ca s/p prostatectomy, 2nd degree AVB who presents to [facility] with AKI and elevated LFTs. Found to have anasarca here, diuresed with bumex. Also found to have urinary retention, s/p foley. Patient seen and examined bedside. Family and wife at bedside as well.  Patient reports that his breathing was never really an issue. He does report that his swelling has improved since being here. No other complaints at this time.  He has already made 1600cc of urine. Weight is down from 74.4kg to 70.8kg.   Intake/Output Summary (Last 24 hours) at 03/04/2022 0811 Last data filed at 03/03/2022 1800 Gross per 24 hour  Intake 1000 ml  Output --  Net 1000 ml    Vitals:  Vitals:   03/03/22 1556 03/03/22 1928 03/03/22 1934 03/04/22 0551  BP: (!) 148/63 (!) 150/57 (!) 148/77 131/64  Pulse: 60 63 91 70  Resp: '16 16 17 20  '$ Temp: 98.3 F (36.8 C) 97.8 F (36.6 C) 98.6 F (37 C) 99.8 F (37.7 C)  TempSrc: Oral     SpO2: 97% 98% 100% 96%  Weight:      Height:         Physical Exam:   General adult male in bed in no acute distress HEENT normocephalic atraumatic extraocular movements intact sclera anicteric Neck supple trachea midline Lungs clear to auscultation bilaterally normal work of breathing at rest on room air Heart S1S2 no rub Abdomen soft  nontender nondistended Extremities no edema  Psych normal mood and affect Neuro alert and oriented x 3 provides hx and follows commands GU foley catheter in place with urine   Medications reviewed   Labs:     Latest Ref Rng & Units 03/04/2022    4:44 AM 03/03/2022    4:18 AM 03/02/2022    9:59 AM  BMP  Glucose 70 - 99 mg/dL 110  93    BUN 8 - 23 mg/dL 51  61    Creatinine 0.61 - 1.24 mg/dL 2.82  2.75    Sodium 135 - 145 mmol/L 137  135  138    138   Potassium 3.5 - 5.1 mmol/L 3.9  3.8  3.6    3.6   Chloride 98 - 111 mmol/L 106  106    CO2 22 - 32 mmol/L 21  19    Calcium 8.9 - 10.3 mg/dL 9.1  9.0       Assessment/Plan:   AKI on CKD4 -followed by Dr. Joelyn Oms. Baseline Cr typically in the 2's -AKI likely multifactorial: bladder outlet obstruction and s/p diuretics for fluid overload.  UA 2/21 neg protein, no blood, no micro.  10.3 cm kidneys without hydro and simple renal cysts - Discontinue foley catheter and monitor for retention as below - defer lasix today.  Note that diuretics are usually on  a PRN basis    Acute urinary retention - Note that he has a soft tissue mass near the prostatectomy bed.  He denies difficulty urinating previously - Have started flomax 0.4 mg nightly  - Discontinue foley catheter.  Monitor for retention after foley removal.  Will need to follow up closely with urology after discharge     Fluid volume overload   - improved  - Diuretics are on hold and note he was previously on home lasix on an as needed basis  - Check daily weights.  Gap in weights from 2/20 until 2/24 with no weight today.      Elevated LFT's -secondary to congestive hepatopathy? Improved with supportive care   HFpEF, recent TAVR -diuretics as above   HTN -controlled on current regimen    Metabolic acidosis - likely secondary to AKI which is resolving with supportive care   Anemia, microcytic - Iron is slightly low - started nu-iron daily as tolerated  - Transfusions  per primary team discretion   Disposition - disposition per primary team.  From a nephrology standpoint acceptable for discharge today after a successful voiding trial.  He states he has an appointment with our office on 2/27 with Dr. Joelyn Oms.  Unable to confirm currently due to firewall? but I will ensure set up follow-up with Dr. Joelyn Oms or a physician extender in 1-2 weeks if an appointment is not yet set up  Claudia Desanctis, MD 03/04/2022 8:39 AM   Addendum:  Note that he does have nephrology follow-up on 03/06/22 at 2pm with Dr. Joelyn Oms at [facility]   Per report urinating after foley removal - there is no post-void residual bladder scan documented yet.  If elevated would discharge with foley and he will need urology follow-up regardless.   Team states that they are arranging home health and they anticipate discharge today.   Claudia Desanctis, MD 2:52 PM 03/04/2022

## 2022-03-04 NOTE — TOC Transition Note (Signed)
Transition of Care [facility]) - CM/SW Discharge Note   Patient Details  Name: [name] MRN: [id number] Date of Birth: January 10, 1926  [facility] CM/SW Contact:  Carles Collet, RN Phone Number: 03/04/2022, 3:56 PM   Clinical Narrative:     Damaris Schooner w patient's wife to discuss DC needs. She states that she is working with outside provider to set up palliative services. She is agreeable to [facility] services, chose [facility] and referral accepted.  No DME needs.    Final next level of care: Home w Home Health Services Barriers to Discharge: No Barriers Identified   Patient Goals and CMS Choice CMS Medicare.gov Compare Post Acute Care list provided to:: Patient Choice offered to / list presented to : Patient  Discharge Placement                         Discharge Plan and Services Additional resources added to the After Visit Summary for                  DME Arranged: N/A         HH Arranged: RN, PT, Social Work [employer] Agency: [place] Date [facility] Agency Contacted: 03/04/22 Time [place]: [id number] Representative spoke with at [place]: [place] ([place]) Interventions SDOH Screenings   Food Insecurity: No Food Insecurity (02/28/2022)  Housing: Low Risk  (02/28/2022)  Transportation Needs: No Transportation Needs (02/28/2022)  Utilities: Not At Risk (02/28/2022)  Alcohol Screen: Low Risk  (07/28/2021)  Depression (PHQ2-9): Low Risk  (11/03/2021)  Financial Resource Strain: Low Risk  (07/28/2021)  Physical Activity: Insufficiently Active (07/28/2021)  Social Connections: Socially Integrated (07/28/2021)  Stress: No Stress Concern Present (07/28/2021)  Tobacco Use: Low Risk  (03/02/2022)     Readmission Risk Interventions    02/07/2022   11:45 AM  Readmission Risk Prevention Plan  Post Dischage Appt Complete  Medication Screening Complete  Transportation Screening Complete

## 2022-03-04 NOTE — Progress Notes (Signed)
Mobility Specialist - Progress Note   03/04/22 1102  Mobility  Activity Ambulated with assistance in room  Level of Assistance Moderate assist, patient does 50-74%  Assistive Device Front wheel walker  Distance Ambulated (ft) 20 ft  Activity Response Tolerated well  Mobility Referral Yes  $Mobility charge 1 Mobility   Pt was received in bed and agreeable to mobility. Pt had general weakness and fatigue, needing ModA to stand from EOB. No other complaints throughout. Pt was returned to bed with all needs met.   Franki Monte  Mobility Specialist Please contact via Solicitor or Rehab office at [phone]

## 2022-03-04 NOTE — Discharge Summary (Signed)
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 [facility] Course: 89 year old male history of diastolic heart failure, hypertension, CKD stage IV, recent TAVR for severe aortic stenosis on February 06, 2022, history of second-degree AV block status post pacemaker, prior prostate cancer status post prostatectomy who presents to the ER today from the PCP office secondary to worsening renal failure and elevated LFTs.   Assessment and Plan: * Acute renal failure superimposed on stage 4 chronic kidney disease ([name]) Observation med/surg bed. Pt with acute urinary retention. Will place foley catheter. Worsening Scr. Pt has soft tissue mass near prostatectomy bed. PET/CT in 12-2021 could not see any activity due to intense radiotracer activity next to bladder.  -foley cath was placed at time of admit given concerns of bladder outlet obstruction -Appreciate assistance by Nephrology. Diuretics were later held with foley cath continued this visit -Pt passed voiding trial. Advised pt to follow up with  Urology soon   Metabolic acidosis -Likely secondary to above renal disease   Acute urinary retention Continued wtih foley catheter. Pt is s/p prostatectomy. PSA normal at 0.88 Recommend pt f/u with [facility]. Pt follows Dr. Gilford Rile -Mass noted on prostatectomy bed -Pt passed voiding trial -Recommend close follow up with Urology   Elevated LFTs -Pt with elevated LFTs. No pain. CT and RUQ show thickened gallbladder wall without cholelithiasis. There is a small amount of pericholecystic fluid.  -Pt without RUQ pain. -HIDA reviewed, no common duct or cystic duct obstruction. Low normal GB EF -Liver appears normal on CT and [place] -Hepatitis panel is negative -LFT's improving -Per GI, elevated LFT's likely related to heart failure. No need for further GI work up. GI has since signed off   Anasarca -Unclear what his anasarca is from. Pt's appetite is quite poor and his serum protein and albumin have been dropping since his TAVR.  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   Pleural effusion on right -Given trial of diuresis, now held    DNR (do not resuscitate)/DNI(Do Not Intubate) -Admitting physican verified with pt and wife that he is a DNR/DNI.   (HFpEF) heart failure with preserved ejection fraction (HCC) Pt has been on lasix for about 12 months.  -Good urine output with bumex and foley cath -Bumex now on hold. Pt reports taking diuretic on PRN basis normally   S/P TAVR (transcatheter aortic valve replacement) Stable. Had echo 02-22-2022 that showed stable TAVR and LVEF 55%.   Essential hypertension -norvasc on hold -resumed home hydralazine  Consultants: Cardiology, Nephrology, GI Procedures performed: [facility]  Disposition: Home Diet recommendation:  Regular diet DISCHARGE MEDICATION: Allergies as of 03/04/2022       Reactions   Aspirin Nausea And Vomiting, Other (See Comments)   Upset stomach- tolerates coated aspirin    Lisinopril Anaphylaxis, Shortness Of Breath   After 3 tablets, he experienced trouble swallowing, throat tightness and hoarseness.    Amoxicillin Nausea Only   Atarax [hydroxyzine Hcl] Nausea And Vomiting   Cephalexin Diarrhea   Ciprofloxacin Nausea Only   Clindamycin Other (See Comments)   Gi upset    Clobetasol Other (See Comments)   Not effective   Codeine Nausea Only, Other (See Comments)   Stomach upset   Fish Allergy Nausea And Vomiting   Fluarix [influenza Virus Vaccine] Itching   Haemophilus Influenzae Itching   Hydrocodone Nausea Only   Hydrocodone-acetaminophen Nausea And Vomiting   Hydroxyzine Nausea And Vomiting   Latex Itching, Other (See Comments)   (After flu shot)   Macrobid [nitrofurantoin Macrocrystal] Nausea Only   Niacin Other (See Comments)   Unknown reaction   Niacin-lovastatin Er Other (See Comments)   Unsure of reaction. Taking simvastatin at home without problems   Niacin-lovastatin Er Other (See Comments)   Reaction not recalled   Nitrofurantoin Other (See Comments)   Upset stomach   Omeprazole Other (See Comments)   Reaction not recalled- stopped by MD   Other Nausea And Vomiting   Tramadol Other (See Comments)   Unknown    Vibramycin [doxycycline] Other (See Comments)   Reaction not recalled   Adhesive [tape] Rash   Bactrim [sulfamethoxazole-trimethoprim] Rash   Colchicine Rash   Gabapentin Other (See Comments)   Painful pimples on tongue   Nortriptyline Rash        Medication List     STOP taking these medications    amLODipine 10 MG tablet Commonly known as: NORVASC   amoxicillin 500 MG tablet Commonly known as: AMOXIL   azithromycin 250 MG tablet Commonly known as: ZITHROMAX       TAKE these medications    acetaminophen 500 MG tablet Commonly known as: TYLENOL Take 1,000 mg by mouth 3 (three) times daily as needed for moderate pain.   allopurinol 100 MG tablet Commonly known as: ZYLOPRIM TAKE 1 TABLET(100 MG) BY  MOUTH DAILY What changed:  how much to take how to take this when to take this   aspirin EC 81 MG tablet Take 81 mg by mouth daily.   AYR NASAL MIST ALLERGY/SINUS NA Place 2 sprays into the nose as needed (dryness).   benzonatate 100 MG capsule Commonly known as: TESSALON Take 1 capsule (100 mg total) by mouth 3 (three) times daily as needed for cough.   Cholecalciferol 25 MCG (1000 UT) capsule Take 1,000 Units by mouth daily.   CITRACAL + D PO Take 2 tablets by mouth in the morning and at bedtime.   cyanocobalamin 1000 MCG/ML injection Commonly known as: VITAMIN B12 Inject 1,000 mcg into the muscle once. Monthly injection   folic acid 1 MG tablet Commonly known as: FOLVITE Take 1 tablet (1 mg total) by mouth daily. Annual appt due in May must see provider for future refills What changed: when to take this   furosemide 40 MG tablet Commonly known as: LASIX TAKE 1 TABLET BY MOUTH EVERY DAY What changed:  when to take this reasons to take this   hydrALAZINE 50 MG tablet Commonly known as: APRESOLINE Take 1 tablet (50 mg total)  by mouth 3 (three) times daily.   iron polysaccharides 150 MG capsule Commonly known as: NIFEREX Take 1 capsule (150 mg total) by mouth daily. Start taking on: March 05, 2022   loratadine 10 MG tablet Commonly known as: CLARITIN Take 10 mg by mouth daily as needed (for seasonal allergies).   nitroGLYCERIN 0.4 MG SL tablet Commonly known as: NITROSTAT DISSOLVE 1 TABLET UNDER THE TONGUE EVERY 5 MINUTES AS NEEDED FOR CHEST PAIN, MAXIMUM 3 TABLETS What changed:  how much to take how to take this when to take this reasons to take this additional instructions   PRESERVISION AREDS 2 PO Take 1 tablet by mouth in the morning and at bedtime.   REFRESH OPTIVE MEGA-3 OP Place 1 drop into both eyes 2 (two) times daily.   tamsulosin 0.4 MG Caps capsule Commonly known as: FLOMAX Take 1 capsule (0.4 mg total) by mouth daily after supper.    trolamine salicylate 10 % cream Commonly known as: ASPERCREME Apply 1 Application topically in the morning and at bedtime. For leg cramps   VICKS VAPOR INHALER IN Place 1 puff into both nostrils as needed (for congestion).   vitamin C 1000 MG tablet Take 1,000 mg by mouth daily.   Vitamin D (Ergocalciferol) 1.25 MG (50000 UNIT) Caps capsule Commonly known as: DRISDOL Take 50,000 Units by mouth every 30 (thirty) days.        Follow-up Information     Rexene Agent, MD Follow up on 03/06/2022.   Specialty: Nephrology Why: at [facility] information: [place] [address] [phone]         [name], MD Follow up in 2 week(s).   Specialty: Internal Medicine Why: Hospital follow up Contact information: [place] [address] [phone]         Ceasar Mons, MD. Schedule an appointment as soon as possible for a visit in 1 week(s).   Specialty: Urology Why: Hospital follow up Contact information: [address] [place] [address] [phone]                Discharge Exam: Danley Danker Weights   02/27/22 2005 03/03/22 0935  Weight: 70.8 kg 65.5 kg   General exam: Awake, laying in bed, in nad Respiratory system: Normal respiratory effort, no wheezing Cardiovascular system: regular rate, s1, s2 Gastrointestinal system: Soft, nondistended, positive BS Central nervous system: CN2-12 grossly intact, strength intact Extremities: Perfused, no clubbing Skin: Normal skin turgor, no notable skin lesions seen Psychiatry: Mood normal // no visual hallucinations   Condition at discharge: fair  The results of significant diagnostics from this hospitalization (including imaging, microbiology, ancillary and laboratory) are listed below for reference.   Imaging Studies: DG CHEST PORT 1 VIEW  Result Date: 03/03/2022 CLINICAL DATA:  Cough EXAM: PORTABLE CHEST 1 VIEW COMPARISON:  Chest x-rays dated  02/21/2022 and 02/13/2022. Chest CT dated 01/10/2022. FINDINGS: Stable cardiomegaly. LEFT chest wall pacemaker/ICD apparatus appears stable in position. Median sternotomy wires appear intact and stable in alignment. Lungs are clear. No pleural effusion or pneumothorax is seen. No acute-appearing osseous abnormality. IMPRESSION: No active disease. No evidence of pneumonia or pulmonary edema. Stable cardiomegaly. Electronically Signed   By: Franki Cabot M.D.   On: 03/03/2022 11:04   CARDIAC CATHETERIZATION  Result Date: 03/02/2022 Images from the original result were not included. INFINITE NAYLOR is a 89 y.o. male  [id number] LOCATION:  FACILITY: [facility] PHYSICIAN: Quay Burow, M.D. 1926/11/18 DATE  OF PROCEDURE:  03/02/2022 DATE OF DISCHARGE: Right heart catheterization History obtained from chart review.  89 year old married Caucasian male referred for right heart cath by Dr. Johnsie Cancel to determine filling pressures.  He does have a history of severe asymmetric septal hypertrophy, remote pacemaker and TAVR in January of this year.  Echo reveals normal LV function with well-functioning aortic bioprosthesis.  The patient has elevated liver function tests and chronic renal insufficiency.  He was referred for right heart cath to determine his hemodynamics. HEMODYNAMICS:  1: Right atrial pressure-11/10 2: Right ventricular pressure-21/0 3: Pulmonary artery pressure-44/13, mean 25 4: Pulmonary artery wedge pressure-A-wave 15, V wave 13, mean 10 Cardiac output-8.1 L/min with an index of 4.3 L/min/m. IMPRESSION:Mr Alcon right heart cath suggested normal filling pressures.  He has mildly elevated pulmonary artery pressures. Quay Burow. MD, [facility] 03/02/2022 10:14 AM    US RENAL  Result Date: 03/01/2022 CLINICAL DATA:  Acute renal failure EXAM: RENAL / URINARY TRACT ULTRASOUND COMPLETE COMPARISON:  CT from 02/27/2022 FINDINGS: Right Kidney: Renal measurements: 10.3 x 4.7 x 4.8 cm. = volume: 121 mL. Multiple cysts  are identified within the right kidney. The largest of these measures 4.5 cm in greatest dimension and is simple in nature. No further follow-up is recommended. No hydronephrosis is noted. Left Kidney: Renal measurements: 10.3 x 5.2 x 4.5 cm. = volume: 125 mL. No hydronephrosis is noted. A 1.1 cm simple cyst is noted within the left kidney stable from the prior exam. No further follow-up is recommended. Bladder: Decompressed Other: None. IMPRESSION: Bilateral simple renal cysts similar to that seen on prior CT examination. No follow-up is recommended. No other focal abnormality is noted. Electronically Signed   By: [name] M.D.   On: 03/01/2022 18:28   NM Hepato W/EF  Result Date: 02/28/2022 CLINICAL DATA:  Elevated liver function tests EXAM: NUCLEAR MEDICINE HEPATOBILIARY IMAGING WITH GALLBLADDER EF TECHNIQUE: Sequential images of the abdomen were obtained out to 60 minutes following intravenous administration of radiopharmaceutical. After oral ingestion of Ensure, gallbladder ejection fraction was determined. At 60 min, normal ejection fraction is greater than 33%. RADIOPHARMACEUTICALS:  7.8 mCi Tc-37m Choletec IV COMPARISON:  CT 02/27/2022 and ultrasound FINDINGS: Prompt uptake and biliary excretion of activity by the liver is seen. Gallbladder activity is visualized, consistent with patency of cystic duct. Biliary activity passes into small bowel, consistent with patent common bile duct. Calculated gallbladder ejection fraction is 36%. (Normal gallbladder ejection fraction with Ensure is greater than 33%.) IMPRESSION: No common duct or cystic duct obstruction. Low normal gallbladder ejection fraction Electronically Signed   By: AJill SideM.D.   On: 02/28/2022 10:42   CT ABDOMEN PELVIS WO CONTRAST  Result Date: 02/27/2022 CLINICAL DATA:  Abnormal lab results right upper quadrant pain EXAM: CT ABDOMEN AND PELVIS WITHOUT CONTRAST TECHNIQUE: Multidetector CT imaging of the abdomen and pelvis was  performed following the standard protocol without IV contrast. RADIATION DOSE REDUCTION: This exam was performed according to the departmental dose-optimization program which includes automated exposure control, adjustment of the mA and/or kV according to patient size and/or use of iterative reconstruction technique. COMPARISON:  Ultrasound 02/27/2022, CT 01/10/2022, PET CT 12/21/2021 FINDINGS: Lower chest: Lung bases demonstrate small left and moderate right pleural effusions. Dependent atelectasis at the bases. Coronary vascular calcifications. Partially visualized intracardiac pacing leads. Interval TAVR. Cardiomegaly with trace pericardial effusion. Hepatobiliary: No focal hepatic abnormality or biliary dilatation. No calcified stone. Slightly distended gallbladder with diffuse gallbladder wall thickening and possible pericholecystic fluid.  Pancreas: Unremarkable. No pancreatic ductal dilatation or surrounding inflammatory changes. Spleen: Borderline enlarged. Adrenals/Urinary Tract: Adrenal glands are normal. Kidneys show no hydronephrosis. Bilateral renal cysts for which no imaging follow-up is recommended. Hyperdense subcentimeter cortical lesions in the kidneys measuring up to 9 mm too small to further characterize. Small right kidney. The bladder is unremarkable Stomach/Bowel: The stomach is nonenlarged. There is no dilated small bowel. No acute bowel wall thickening. Vascular/Lymphatic: Moderate aortic atherosclerosis. No aneurysm. No suspicious lymph nodes. Reproductive: Prostatectomy. Stable soft tissue thickening at the right aspect of the prostatectomy bed measuring 2 x 1.5 cm. Other: No free air. Small volume free fluid in the abdomen and pelvis. Generalized subcutaneous edema consistent with anasarca. Metallic foreign body at the anterior pelvis with artifact. Musculoskeletal: No acute or suspicious osseous abnormality. IMPRESSION: 1. Slightly distended gallbladder with diffuse gallbladder wall  thickening and possible pericholecystic fluid. Reference previously same day right upper quadrant ultrasound. 2. Cardiomegaly with small left and moderate right pleural effusions. Small volume free fluid in the abdomen and pelvis. Generalized subcutaneous edema consistent with anasarca. 3. Aortic atherosclerosis. Aortic Atherosclerosis (ICD10-I70.0). Electronically Signed   By: Donavan Foil M.D.   On: 02/27/2022 23:19   US Abdomen Limited RUQ (LIVER/GB)  Result Date: 02/27/2022 CLINICAL DATA:  Abdominal pain. EXAM: ULTRASOUND ABDOMEN LIMITED RIGHT UPPER QUADRANT COMPARISON:  CT of the chest abdomen pelvis dated 01/10/2022. FINDINGS: Gallbladder: There is no gallstone. The gallbladder wall is thickened measuring 4 mm. There is pericholecystic fluid. Negative sonographic Murphy's sign. Common bile duct: Diameter: 4 mm Liver: The liver is unremarkable. Portal vein is patent on color Doppler imaging with normal direction of blood flow towards the liver. Other: There is CAD partially visualized right pleural effusion. A 4.5 cm right renal upper pole cyst. IMPRESSION: 1. No gallstone. Thickened gallbladder wall with small pericholecystic fluid of indeterminate etiology. Clinical correlation is recommended. A hepatobiliary scintigraphy may provide better evaluation of the gallbladder if there is a high clinical concern for acute cholecystitis . 2. Right pleural effusion. Electronically Signed   By: Anner Crete M.D.   On: 02/27/2022 21:41   VAS [place] UPPER EXTREMITY VENOUS DUPLEX  Result Date: 02/23/2022 UPPER VENOUS STUDY  Patient Name:  MARLEE SERMENO  Date of Exam:   02/22/2022 Medical Rec #: [id number]           Accession #:    [id number] Date of Birth: Feb 08, 1926           Patient Gender: M Patient Age:   34 years Exam Location:  [facility] Procedure:      VAS [place] UPPER EXTREMITY VENOUS DUPLEX Referring Phys: Vance Gather --------------------------------------------------------------------------------   Indications: Pain, and Swelling Risk Factors: Cancer Lung. Comparison Study: No prior study. Performing Technologist: Candie Chroman  Examination Guidelines: A complete evaluation includes B-mode imaging, spectral Doppler, color Doppler, and power Doppler as needed of all accessible portions of each vessel. Bilateral testing is considered an integral part of a complete examination. Limited examinations for reoccurring indications may be performed as noted.  Right Findings: +----------+------------+---------+-----------+----------+-------+ RIGHT     CompressiblePhasicitySpontaneousPropertiesSummary +----------+------------+---------+-----------+----------+-------+ Subclavian    Full       Yes       Yes                      +----------+------------+---------+-----------+----------+-------+  Left Findings: +----------+------------+---------+-----------+----------+-------+ LEFT      CompressiblePhasicitySpontaneousPropertiesSummary +----------+------------+---------+-----------+----------+-------+ IJV           Full  Yes       Yes                      +----------+------------+---------+-----------+----------+-------+ Subclavian    Full       Yes       Yes                      +----------+------------+---------+-----------+----------+-------+ Axillary      Full       Yes       Yes                      +----------+------------+---------+-----------+----------+-------+ Brachial      Full       Yes       Yes                      +----------+------------+---------+-----------+----------+-------+ Radial        Full                                          +----------+------------+---------+-----------+----------+-------+ Ulnar         Full                                          +----------+------------+---------+-----------+----------+-------+ Cephalic      Full                                           +----------+------------+---------+-----------+----------+-------+ Basilic       None                                   Acute  +----------+------------+---------+-----------+----------+-------+  Summary:  Right: No evidence of thrombosis in the subclavian.  Left: No evidence of deep vein thrombosis in the upper extremity. Findings consistent with acute superficial vein thrombosis involving the left basilic vein.  *See table(s) above for measurements and observations.  Diagnosing physician: Deitra Mayo MD Electronically signed by Deitra Mayo MD on 02/23/2022 at 10:34:28 AM.    Final    ECHOCARDIOGRAM COMPLETE  Result Date: 02/22/2022    ECHOCARDIOGRAM REPORT   Patient Name:   LEVOY ISIDORE Date of Exam: 02/22/2022 Medical Rec #:  [id number]          Height:       70.0 in Accession #:    [id number]         Weight:       173.9 lb Date of Birth:  10-Jul-1926          BSA:          1.967 m Patient Age:    67 years           BP:           127/61 mmHg Patient Gender: M                  HR:           69 bpm. Exam Location:  Inpatient Procedure: 2D Echo, 3D Echo, Cardiac Doppler and Color Doppler Indications:    Fever  History:        Patient has prior history of Echocardiogram examinations, most                 recent 02/07/2022. CHF, Abnormal ECG, Aortic Valve Disease,                 Arrythmias:Bradycardia and Ventricular Fibrillation; Risk                 Factors:Hypertension and Dyslipidemia.                 Aortic Valve: 26 mm Sapien prosthetic, stented (TAVR) valve is                 present in the aortic position. Procedure Date: 02/06/2022.  Sonographer:    Roseanna Rainbow RDCS Referring Phys: [id number] [place]  1. Poor acoustic windows limit study Difficlt to see endocardium in all views.. Left ventricular ejection fraction, by estimation, is 50 to 55%. The left ventricle has low normal function. The left ventricle has no regional wall motion abnormalities. There is severe  asymmetric left ventricular hypertrophy.  2. Right ventricular systolic function is normal. The right ventricular size is normal. There is moderately elevated pulmonary artery systolic pressure.  3. Left atrial size was moderately dilated.  4. Right atrial size was moderately dilated.  5. Mild mitral valve regurgitation. Moderate mitral annular calcification.  6. S/p TAVR (26 mm Sapien prosthesis, procedure date 02/06/22). Peak and mean gradients through the vavle are 15 and 8 mm Hg respectively. NO signficant change from echo in 1.30. 2024.. The aortic valve has been repaired/replaced. Aortic valve regurgitation is not visualized. There is a 26 mm Sapien prosthetic (TAVR) valve present in the aortic position. Procedure Date: 02/06/2022.  7. Aortic dilatation noted. There is mild dilatation of the ascending aorta, measuring 39 mm.  8. The inferior vena cava is dilated in size with <50% respiratory variability, suggesting right atrial pressure of 15 mmHg. FINDINGS  Left Ventricle: Poor acoustic windows limit study Difficlt to see endocardium in all views. Left ventricular ejection fraction, by estimation, is 50 to 55%. The left ventricle has low normal function. The left ventricle has no regional wall motion abnormalities. The left ventricular internal cavity size was normal in size. There is severe asymmetric left ventricular hypertrophy. Right Ventricle: The right ventricular size is normal. Right vetricular wall thickness was not assessed. Right ventricular systolic function is normal. There is moderately elevated pulmonary artery systolic pressure. The tricuspid regurgitant velocity is  2.91 m/s, and with an assumed right atrial pressure of 15 mmHg, the estimated right ventricular systolic pressure is [id number] mmHg. Left Atrium: Left atrial size was moderately dilated. Right Atrium: Right atrial size was moderately dilated. Pericardium: There is no evidence of pericardial effusion. Mitral Valve: There is mild  thickening of the mitral valve leaflet(s). Moderate mitral annular calcification. Mild mitral valve regurgitation. Tricuspid Valve: The tricuspid valve is normal in structure. Tricuspid valve regurgitation is mild. Aortic Valve: S/p TAVR (26 mm Sapien prosthesis, procedure date 02/06/22). Peak and mean gradients through the vavle are 15 and 8 mm Hg respectively. NO signficant change from echo in 1.30. 2024. The aortic valve has been repaired/replaced. Aortic valve regurgitation is not visualized. Aortic valve mean gradient measures 7.5 mmHg. Aortic valve peak gradient measures 14.9 mmHg. Aortic valve area, by VTI measures 3.00 cm. There is a 26 mm Sapien prosthetic, stented (TAVR) valve present in the aortic position. Procedure Date: 02/06/2022. Pulmonic Valve:  The pulmonic valve was normal in structure. Pulmonic valve regurgitation is not visualized. Aorta: The aortic root is normal in size and structure and aortic dilatation noted. There is mild dilatation of the ascending aorta, measuring 39 mm. Venous: The inferior vena cava is dilated in size with less than 50% respiratory variability, suggesting right atrial pressure of 15 mmHg. IAS/Shunts: No atrial level shunt detected by color flow Doppler.  LEFT VENTRICLE PLAX 2D LVIDd:         4.65 cm      Diastology LVIDs:         3.80 cm      LV e' medial:   9.03 cm/s LV PW:         1.38 cm      LV E/e' medial: 14.6 LV IVS:        1.48 cm LVOT diam:     2.60 cm LV SV:         113 LV SV Index:   57 LVOT Area:     5.31 cm     3D Volume EF:                             3D EF:        42 %                             LV EDV:       189 ml LV Volumes (MOD)            LV ESV:       109 ml LV vol d, MOD A2C: 121.0 ml LV SV:        80 ml LV vol d, MOD A4C: 94.4 ml LV vol s, MOD A2C: 57.2 ml LV vol s, MOD A4C: 43.1 ml LV SV MOD A2C:     63.8 ml LV SV MOD A4C:     94.4 ml LV SV MOD BP:      55.6 ml RIGHT VENTRICLE            IVC RV S prime:     9.14 cm/s  IVC diam: 2.50 cm TAPSE  (M-mode): 1.2 cm LEFT ATRIUM             Index        RIGHT ATRIUM           Index LA diam:        5.10 cm 2.59 cm/m   RA Area:     26.40 cm LA Vol (A2C):   81.8 ml 41.58 ml/m  RA Volume:   90.10 ml  45.80 ml/m LA Vol (A4C):   82.9 ml 42.14 ml/m LA Biplane Vol: 85.3 ml 43.36 ml/m  AORTIC VALVE AV Area (Vmax):    3.05 cm AV Area (Vmean):   3.13 cm AV Area (VTI):     3.00 cm AV Vmax:           193.00 cm/s AV Vmean:          125.500 cm/s AV VTI:            0.375 m AV Peak Grad:      14.9 mmHg AV Mean Grad:      7.5 mmHg LVOT Vmax:         111.00 cm/s LVOT Vmean:        73.900 cm/s LVOT VTI:  0.212 m LVOT/AV VTI ratio: 0.57  AORTA Ao Root diam: 3.30 cm Ao Asc diam:  3.90 cm MITRAL VALVE                TRICUSPID VALVE MV Area (PHT): 4.26 cm     TR Peak grad:   33.9 mmHg MV Decel Time: 178 msec     TR Vmax:        291.00 cm/s MV E velocity: 131.50 cm/s MV A velocity: 107.00 cm/s  SHUNTS MV E/A ratio:  1.23         Systemic VTI:  0.21 m                             Systemic Diam: 2.60 cm Dorris Carnes MD Electronically signed by Dorris Carnes MD Signature Date/Time: 02/22/2022/3:18:48 PM    Final    DG Chest Port 1 View  Result Date: 02/21/2022 CLINICAL DATA:  Possible sepsis lethargy fever EXAM: PORTABLE CHEST 1 VIEW COMPARISON:  02/13/2022 FINDINGS: Left-sided dual lead pacing device as before. Valve prosthesis. Status post sternotomy. No focal airspace disease. Probable small pleural effusions. Stable cardiomediastinal silhouette with aortic atherosclerosis. IMPRESSION: Probable small pleural effusions. No focal airspace disease. Electronically Signed   By: Donavan Foil M.D.   On: 02/21/2022 17:23   DG Chest 2 View  Result Date: 02/13/2022 CLINICAL DATA:  Tremors.  Status post valve replacement. EXAM: CHEST - 2 VIEW COMPARISON:  Chest radiograph dated 02/02/2022. FINDINGS: There is blunting of the posterior costophrenic angles on the lateral view consistent with small pleural effusions. There is  associated atelectasis of the lung bases. No pneumothorax. Mild cardiomegaly. Median sternotomy wires, CABG vascular clips, and aortic valve with placement. Left pectoral pacemaker device. No acute osseous pathology. Degenerative changes of the spine. IMPRESSION: Small bilateral pleural effusions with associated atelectasis. Electronically Signed   By: Anner Crete M.D.   On: 02/13/2022 17:44   ECHOCARDIOGRAM COMPLETE  Result Date: 02/08/2022    ECHOCARDIOGRAM REPORT   Patient Name:   [name] Date of Exam: 02/07/2022 Medical Rec #:  [id number]          Height:       70.0 in Accession #:    [id number]         Weight:       157.6 lb Date of Birth:  13-Oct-1926          BSA:          1.886 m Patient Age:    40 years           BP:           158/71 mmHg Patient Gender: M                  HR:           70 bpm. Exam Location:  Inpatient Procedure: 2D Echo, Cardiac Doppler and Color Doppler Indications:    Post TAVR evaluation V43.3 / Z95.2  History:        Patient has prior history of Echocardiogram examinations, most                 recent 02/06/2022. CHF, CAD, Pacemaker, Aortic Valve Disease,                 Arrythmias:Atrial Fibrillation and Bradycardia; Risk                 Factors:GERD, Dyslipidemia  and Hypertension.                 Aortic Valve: 26 mm Sapien prosthetic, stented (TAVR) valve is                 present in the aortic position. Procedure Date: 02/06/2022.  Sonographer:    Bernadene Person RDCS Referring Phys: [id number] [place]  1. The aortic valve has been repaired/replaced. Aortic valve regurgitation is not visualized. There is a 26 mm Sapien prosthetic (TAVR) valve present in the aortic position. Procedure Date: 02/06/2022. Echo findings are consistent with normal structure and function of the aortic valve prosthesis. Aortic valve area, by VTI measures 2.58 cm. Aortic valve mean gradient measures 7.3 mmHg. Aortic valve Vmax measures 1.84 m/s.  2. Left ventricular  ejection fraction, by estimation, is 55 to 60%. The left ventricle has normal function. The left ventricle has no regional wall motion abnormalities. There is mild concentric left ventricular hypertrophy. Left ventricular diastolic parameters are consistent with Grade II diastolic dysfunction (pseudonormalization).  3. Right ventricular systolic function is normal. The right ventricular size is normal. There is mildly elevated pulmonary artery systolic pressure. The estimated right ventricular systolic pressure is [id number] mmHg.  4. Left atrial size was severely dilated.  5. The mitral valve is grossly normal. Mild to moderate mitral valve regurgitation. No evidence of mitral stenosis.  6. The inferior vena cava is dilated in size with >50% respiratory variability, suggesting right atrial pressure of 8 mmHg. FINDINGS  Left Ventricle: Left ventricular ejection fraction, by estimation, is 55 to 60%. The left ventricle has normal function. The left ventricle has no regional wall motion abnormalities. The left ventricular internal cavity size was normal in size. There is  mild concentric left ventricular hypertrophy. Left ventricular diastolic parameters are consistent with Grade II diastolic dysfunction (pseudonormalization). Right Ventricle: The right ventricular size is normal. No increase in right ventricular wall thickness. Right ventricular systolic function is normal. There is mildly elevated pulmonary artery systolic pressure. The tricuspid regurgitant velocity is 2.88  m/s, and with an assumed right atrial pressure of 8 mmHg, the estimated right ventricular systolic pressure is [id number] mmHg. Left Atrium: Left atrial size was severely dilated. Right Atrium: Right atrial size was normal in size. Pericardium: There is no evidence of pericardial effusion. Mitral Valve: The mitral valve is grossly normal. Mild mitral annular calcification. Mild to moderate mitral valve regurgitation. No evidence of mitral valve stenosis.  Tricuspid Valve: The tricuspid valve is grossly normal. Tricuspid valve regurgitation is mild . No evidence of tricuspid stenosis. Aortic Valve: The aortic valve has been repaired/replaced. Aortic valve regurgitation is not visualized. Aortic valve mean gradient measures 7.3 mmHg. Aortic valve peak gradient measures 13.5 mmHg. Aortic valve area, by VTI measures 2.58 cm. There is a 26 mm Sapien prosthetic, stented (TAVR) valve present in the aortic position. Procedure Date: 02/06/2022. Echo findings are consistent with normal structure and function of the aortic valve prosthesis. Pulmonic Valve: The pulmonic valve was grossly normal. Pulmonic valve regurgitation is trivial. No evidence of pulmonic stenosis. Aorta: The aortic root and ascending aorta are structurally normal, with no evidence of dilitation. Venous: The inferior vena cava is dilated in size with greater than 50% respiratory variability, suggesting right atrial pressure of 8 mmHg. IAS/Shunts: The atrial septum is grossly normal. Additional Comments: A device lead is visualized in the right atrium and right ventricle.  LEFT VENTRICLE PLAX 2D LVIDd:  4.70 cm      Diastology LVIDs:         3.40 cm      LV e' medial:    6.45 cm/s LV PW:         1.10 cm      LV E/e' medial:  15.4 LV IVS:        1.10 cm      LV e' lateral:   9.29 cm/s LVOT diam:     2.26 cm      LV E/e' lateral: 10.7 LV SV:         98 LV SV Index:   52 LVOT Area:     4.01 cm  LV Volumes (MOD) LV vol d, MOD A2C: 149.0 ml LV vol d, MOD A4C: 156.0 ml LV vol s, MOD A2C: 65.8 ml LV vol s, MOD A4C: 57.7 ml LV SV MOD A2C:     83.2 ml LV SV MOD A4C:     156.0 ml LV SV MOD BP:      92.4 ml RIGHT VENTRICLE RV S prime:     8.32 cm/s TAPSE (M-mode): 1.6 cm LEFT ATRIUM             Index        RIGHT ATRIUM           Index LA diam:        4.90 cm 2.60 cm/m   RA Area:     19.00 cm LA Vol (A2C):   96.5 ml 51.15 ml/m  RA Volume:   48.30 ml  25.60 ml/m LA Vol (A4C):   91.7 ml 48.61 ml/m LA Biplane  Vol: 99.9 ml 52.96 ml/m  AORTIC VALVE AV Area (Vmax):    2.65 cm AV Area (Vmean):   2.59 cm AV Area (VTI):     2.58 cm AV Vmax:           184.00 cm/s AV Vmean:          124.667 cm/s AV VTI:            0.379 m AV Peak Grad:      13.5 mmHg AV Mean Grad:      7.3 mmHg LVOT Vmax:         121.50 cm/s LVOT Vmean:        80.450 cm/s LVOT VTI:          0.244 m LVOT/AV VTI ratio: 0.64  AORTA Ao Root diam: 3.60 cm Ao Asc diam:  3.60 cm MITRAL VALVE                  TRICUSPID VALVE MV Area (PHT): 3.53 cm       TR Peak grad:   33.2 mmHg MV Decel Time: 215 msec       TR Vmax:        288.00 cm/s MR Peak grad:    133.6 mmHg MR Mean grad:    80.0 mmHg    SHUNTS MR Vmax:         578.00 cm/s  Systemic VTI:  0.24 m MR Vmean:        423.0 cm/s   Systemic Diam: 2.26 cm MR PISA:         1.01 cm MR PISA Eff ROA: 7 mm MR PISA Radius:  0.40 cm MV E velocity: 99.20 cm/s MV A velocity: 111.00 cm/s MV E/A ratio:  0.89 Eleonore Chiquito MD Electronically signed by Eleonore Chiquito MD Signature Date/Time: 02/08/2022/10:27:22  AM    Final    ECHOCARDIOGRAM LIMITED  Result Date: 02/06/2022    ECHOCARDIOGRAM LIMITED REPORT   Patient Name:   [name] Date of Exam: 02/06/2022 Medical Rec #:  [id number]          Height:       70.0 in Accession #:    [id number]         Weight:       154.0 lb Date of Birth:  [birth year]          BSA:          1.868 m Patient Age:    74 years           BP:           167/91 mmHg Patient Gender: M                  HR:           68 bpm. Exam Location:  Inpatient Procedure: Limited Echo, Limited Color Doppler and Cardiac Doppler Indications:     Aortic Stenosis  History:         Patient has prior history of Echocardiogram examinations, most                  recent 12/18/2021. CHF, Previous Myocardial Infarction and CAD,                  Pacemaker; Risk Factors:GERD, Hypertension and Dyslipidemia.                  Aortic Valve: 26 mm Sapien prosthetic, stented (TAVR) valve is                  present in the aortic  position. Procedure Date: 02/06/2022.  Sonographer:     Bernadene Person RDCS Referring Phys:  [place] Diagnosing Phys: Eleonore Chiquito MD  Sonographer Comments: 65m Edwards Sapien aortic valve placed. IMPRESSIONS  1. 26 mm S3 TAVR. Vmax 1 m/s, MG 2.0 mmHG, EOA 3.64 cm2, DI 0.82. No regurgitation or paravalvular leak. The aortic valve has been repaired/replaced. Aortic valve regurgitation is not visualized. No aortic stenosis is present. There is a 26 mm Sapien prosthetic (TAVR) valve present in the aortic position. Procedure Date: 02/06/2022.  2. Left ventricular ejection fraction, by estimation, is 55 to 60%. The left ventricle has normal function. The left ventricle has no regional wall motion abnormalities.  3. Right ventricular systolic function is normal. The right ventricular size is normal.  4. The mitral valve is degenerative. Mild mitral valve regurgitation. No evidence of mitral stenosis.  5. Tricuspid valve regurgitation is mild to moderate. FINDINGS  Left Ventricle: Left ventricular ejection fraction, by estimation, is 55 to 60%. The left ventricle has normal function. The left ventricle has no regional wall motion abnormalities. Right Ventricle: The right ventricular size is normal. No increase in right ventricular wall thickness. Right ventricular systolic function is normal. Pericardium: There is no evidence of pericardial effusion. Mitral Valve: The mitral valve is degenerative in appearance. Mild mitral valve regurgitation. No evidence of mitral valve stenosis. Tricuspid Valve: The tricuspid valve is grossly normal. Tricuspid valve regurgitation is mild to moderate. No evidence of tricuspid stenosis. Aortic Valve: 26 mm S3 TAVR. Vmax 1 m/s, MG 2.0 mmHG, EOA 3.64 cm2, DI 0.82. No regurgitation or paravalvular leak. The aortic valve has been repaired/replaced. Aortic valve regurgitation is not visualized. Aortic regurgitation PHT measures 433 msec. No aortic stenosis is  present. Aortic valve  mean gradient measures 2.0 mmHg. Aortic valve peak gradient measures 3.9 mmHg. Aortic valve area, by VTI measures 3.64 cm. There is a 26 mm Sapien prosthetic, stented (TAVR) valve present in the aortic position. Procedure Date: 02/06/2022. Pulmonic Valve: The pulmonic valve was grossly normal. Pulmonic valve regurgitation is trivial. No evidence of pulmonic stenosis. Additional Comments: Spectral Doppler performed. Color Doppler performed.  LEFT VENTRICLE PLAX 2D LVOT diam:     2.38 cm LV SV:         89 LV SV Index:   47 LVOT Area:     4.45 cm  AORTIC VALVE AV Area (Vmax):    4.53 cm AV Area (Vmean):   1.63 cm AV Area (VTI):     3.64 cm AV Vmax:           98.20 cm/s AV Vmean:          169.150 cm/s AV VTI:            0.243 m AV Peak Grad:      3.9 mmHg AV Mean Grad:      2.0 mmHg LVOT Vmax:         100.00 cm/s LVOT Vmean:        61.800 cm/s LVOT VTI:          0.199 m LVOT/AV VTI ratio: 0.82 AI PHT:            433 msec TRICUSPID VALVE TR Peak grad:   48.2 mmHg TR Vmax:        347.00 cm/s  SHUNTS Systemic VTI:  0.20 m Systemic Diam: 2.38 cm Eleonore Chiquito MD Electronically signed by Eleonore Chiquito MD Signature Date/Time: 02/06/2022/12:40:49 PM    Final    Structural Heart Procedure  Result Date: 02/06/2022 See surgical note for result.   Microbiology: Results for orders placed or performed during the hospital encounter of 02/21/22  Resp panel by RT-PCR (RSV, Flu A&B, Covid) Anterior Nasal Swab     Status: None   Collection Time: 02/21/22  5:05 PM   Specimen: Anterior Nasal Swab  Result Value Ref Range Status   SARS Coronavirus 2 by RT PCR NEGATIVE NEGATIVE Final   Influenza A by PCR NEGATIVE NEGATIVE Final   Influenza B by PCR NEGATIVE NEGATIVE Final    Comment: (NOTE) The Xpert Xpress SARS-CoV-2/FLU/RSV plus assay is intended as an aid in the diagnosis of influenza from Nasopharyngeal swab specimens and should not be used as a sole basis for treatment. Nasal washings and aspirates are unacceptable  for Xpert Xpress SARS-CoV-2/FLU/RSV testing.  Fact Sheet for Patients: EntrepreneurPulse.com.au  Fact Sheet for Healthcare Providers: IncredibleEmployment.be  This test is not yet approved or cleared by the [place] FDA and has been authorized for detection and/or diagnosis of SARS-CoV-2 by FDA under an Emergency Use Authorization (EUA). This EUA will remain in effect (meaning this test can be used) for the duration of the COVID-19 declaration under Section 564(b)(1) of the Act, 21 U.S.C. section 360bbb-3(b)(1), unless the authorization is terminated or revoked.     Resp Syncytial Virus by PCR NEGATIVE NEGATIVE Final    Comment: (NOTE) Fact Sheet for Patients: EntrepreneurPulse.com.au  Fact Sheet for Healthcare Providers: IncredibleEmployment.be  This test is not yet approved or cleared by the [place] FDA and has been authorized for detection and/or diagnosis of SARS-CoV-2 by FDA under an Emergency Use Authorization (EUA). This EUA will remain in effect (meaning this test can be used) for the duration  of the COVID-19 declaration under Section 564(b)(1) of the Act, 21 U.S.C. section 360bbb-3(b)(1), unless the authorization is terminated or revoked.  Performed at [facility], [place] [address] [place], [place] [address]   Urine Culture (for pregnant, neutropenic or urologic patients or patients with an indwelling urinary catheter)     Status: Abnormal   Collection Time: 02/21/22  5:05 PM   Specimen: Urine, Clean Catch  Result Value Ref Range Status   Specimen Description URINE, CLEAN CATCH  Final   Special Requests NONE  Final   Culture (A)  Final    <10,000 COLONIES/mL INSIGNIFICANT GROWTH Performed at [facility], [place] [address] [place], [place] [address]    Report Status 02/22/2022 FINAL  Final  Blood Culture (routine x 2)     Status: None   Collection Time: 02/21/22  5:10  PM   Specimen: BLOOD LEFT ARM  Result Value Ref Range Status   Specimen Description BLOOD LEFT ARM  Final   Special Requests   Final    BOTTLES DRAWN AEROBIC AND ANAEROBIC Blood Culture results may not be optimal due to an inadequate volume of blood received in culture bottles   Culture   Final    NO GROWTH 5 DAYS Performed at [facility], [place] [address], [place], [place] [address]    Report Status 02/26/2022 FINAL  Final  Blood Culture (routine x 2)     Status: None   Collection Time: 02/21/22  5:20 PM   Specimen: BLOOD RIGHT ARM  Result Value Ref Range Status   Specimen Description BLOOD RIGHT ARM  Final   Special Requests   Final    BOTTLES DRAWN AEROBIC AND ANAEROBIC Blood Culture results may not be optimal due to an excessive volume of blood received in culture bottles   Culture   Final    NO GROWTH 5 DAYS Performed at [facility], [place] [address] [place], [place] [address]    Report Status 02/26/2022 FINAL  Final  Respiratory (~20 pathogens) panel by PCR     Status: None   Collection Time: 02/21/22  8:09 PM   Specimen: Nasopharyngeal Swab; Respiratory  Result Value Ref Range Status   Adenovirus NOT DETECTED NOT DETECTED Final   Coronavirus 229E NOT DETECTED NOT DETECTED Final    Comment: (NOTE) The Coronavirus on the Respiratory Panel, DOES NOT test for the novel  Coronavirus (2019 nCoV)    Coronavirus HKU1 NOT DETECTED NOT DETECTED Final   Coronavirus NL63 NOT DETECTED NOT DETECTED Final   Coronavirus OC43 NOT DETECTED NOT DETECTED Final   Metapneumovirus NOT DETECTED NOT DETECTED Final   Rhinovirus / Enterovirus NOT DETECTED NOT DETECTED Final   Influenza A NOT DETECTED NOT DETECTED Final   Influenza B NOT DETECTED NOT DETECTED Final   Parainfluenza Virus 1 NOT DETECTED NOT DETECTED Final   Parainfluenza Virus 2 NOT DETECTED NOT DETECTED Final   Parainfluenza Virus 3 NOT DETECTED NOT DETECTED Final   Parainfluenza Virus 4 NOT DETECTED NOT DETECTED  Final   Respiratory Syncytial Virus NOT DETECTED NOT DETECTED Final   Bordetella pertussis NOT DETECTED NOT DETECTED Final   Bordetella Parapertussis NOT DETECTED NOT DETECTED Final   Chlamydophila pneumoniae NOT DETECTED NOT DETECTED Final   Mycoplasma pneumoniae NOT DETECTED NOT DETECTED Final    Comment: Performed at [facility], [place] [address], [place], [place] [address]   *Note: Due to a large number of results and/or encounters for  the requested time period, some results have not been displayed. A complete set of results can be found in Results Review.    Labs: CBC: Recent Labs  Lab 02/27/22 1958 02/28/22 0353 03/01/22 0529 03/02/22 0703 03/02/22 0959 03/03/22 0418  WBC 6.8 5.8 6.2 6.0  --  6.6  NEUTROABS 5.4 4.4  --   --   --   --   HGB 8.7* 8.2* 7.9* 8.1* 9.5*  9.5* 8.6*  HCT 25.3* 23.7* 22.2* 22.4* 28.0*  28.0* 24.9*  MCV 74.0* 72.5* 70.3* 71.1*  --  73.7*  PLT 261 236 205 273  --  [id number]   Basic Metabolic Panel: Recent Labs  Lab 02/28/22 0353 03/01/22 0529 03/02/22 0703 03/02/22 0959 03/03/22 0418 03/04/22 0444  NA 131* 134* 134* 138  138 135 137  K 3.5 3.5 3.5 3.6  3.6 3.8 3.9  CL 104 106 106  --  106 106  CO2 18* 19* 19*  --  19* 21*  GLUCOSE 93 84 92  --  93 110*  BUN 68* 67* 66*  --  61* 51*  CREATININE 3.23* 3.31* 3.12*  --  2.75* 2.82*  CALCIUM 9.3 9.0 9.0  --  9.0 9.1  MG 2.4  --   --   --   --   --    Liver Function Tests: Recent Labs  Lab 02/28/22 0353 03/01/22 0529 03/02/22 0703 03/03/22 0418 03/04/22 0444  AST 162* 114* 61* 39 36  ALT 174* 146* 112* 89* 72*  ALKPHOS 453* 491* 375* 350* 348*  BILITOT 2.3* 1.8* 1.6* 1.6* 1.4*  PROT 5.8* 5.3* 5.8* 6.0* 6.2*  ALBUMIN 3.0* 2.9* 3.0* 3.1* 3.2*   CBG: No results for input(s): "GLUCAP" in the last 168 hours.  Discharge time spent: less than 30 minutes.  Signed: Marylu Lund, MD [facility] 03/04/2022

## 2022-03-06 ENCOUNTER — Encounter: Payer: Self-pay | Admitting: *Deleted

## 2022-03-06 ENCOUNTER — Telehealth: Payer: Self-pay | Admitting: *Deleted

## 2022-03-06 NOTE — Transitions of Care (Post Inpatient/ED Visit) (Signed)
03/06/2022  Name: [name] MRN: [id number] DOB: [birth year]  Today's TOC FU Call Status: Today's TOC FU Call Status:: Successful TOC FU Call Competed (HIPAA identifiers verified x 2) TOC FU Call Complete Date: 03/06/22  Transition Care Management Follow-up Telephone Call Date of Discharge: 03/04/22 Discharge Facility: [facility]) Type of Discharge: Inpatient Admission Primary Inpatient Discharge Diagnosis:: Acute renal failure in setting of CKD and recent TAVR surgery How have you been since you were released from the hospital?: Better ("I am doing okay but moving slow; using my cane.  My wife helps me with anything I need.  We are on the way now to the doctor appointment with the nephrologist") Any questions or concerns?: No  Items Reviewed: Did you receive and understand the discharge instructions provided?: Yes Medications obtained and verified?: Yes (Medications Reviewed) (declined full medication review today; in car driving to provider appointment; confirmed patient obtained all newly Rx'd medications post- recent hospital discharge) Any new allergies since your discharge?: No Dietary orders reviewed?: No (unable- patient currently in car driving to provider appointment) Do you have support at home?: Yes People in Home: spouse Name of Support/Comfort Primary Source: reports spouse assists with all care needs as needed/ indicated  Home Care and Equipment/Supplies: [facility] Ordered?: Yes Name of [place]:: [facility] Has Agency set up a time to come to your home?: Yes ("they are supposed to come this afternoon around 3:00 pm") [place] Visit Date: 03/06/22 Any new equipment or medical supplies ordered?: No  Functional Questionnaire: Do you need assistance with bathing/showering or dressing?: Yes Do you need assistance with meal preparation?: Yes Do you need assistance with eating?: No Do you have difficulty maintaining continence:  No Do you need assistance with getting out of bed/getting out of a chair/moving?: Yes Do you have difficulty managing or taking your medications?: Yes  Folllow up appointments reviewed: PCP Follow-up appointment confirmed?: Yes Date of PCP follow-up appointment?: 03/09/22 Follow-up Provider: PCP Dr. Quay Burow Specialist [facility] Follow-up appointment confirmed?: Yes Date of Specialist follow-up appointment?: 03/06/22 Follow-Up Specialty Provider:: nephrology provider Do you need transportation to your follow-up appointment?: No Do you understand care options if your condition(s) worsen?: Yes-patient verbalized understanding  SDOH Interventions Today    Flowsheet Row Most Recent Value  SDOH Interventions   Transportation Interventions Intervention Not Indicated  [spouse provides transportation]      TOC Interventions Today    Flowsheet Row Most Recent Value  TOC Interventions   TOC Interventions Discussed/Reviewed TOC Interventions Discussed  [sent request to scheduling care guide to contact patient to schedule telephone visit with RN CM Care Coordinator,  unable to schedule today due to patient being in route to provider appointment/ not near calendar]      Interventions Today    Flowsheet Row Most Recent Value  Chronic Disease   Chronic disease during today's visit Chronic Kidney Disease/End Stage Renal Disease (ESRD)  General Interventions   General Interventions Discussed/Reviewed General Interventions Discussed, Doctor Visits, Referral to Nurse  [sent request to scheduling care guide to contact patient to schedule telephone visit with RN CM Care Coordinator,  unable to schedule today due to patient being in route to provider appointment/ not near calendar]  Doctor Visits Discussed/Reviewed Doctor Visits Discussed, PCP, Specialist  PCP/Specialist Visits Compliance with follow-up visit  Pharmacy Interventions   Pharmacy Dicussed/Reviewed Pharmacy Topics Discussed      Oneta Rack, RN, BSN, CCRN Alumnus RN CM Care Coordination/ Transition  of [place] Management [phone]: direct office

## 2022-03-07 ENCOUNTER — Telehealth: Payer: Self-pay | Admitting: *Deleted

## 2022-03-07 NOTE — Progress Notes (Signed)
  Care Coordination  Outreach Note  03/07/2022 Name: [name] MRN: [id number] DOB: Apr 15, 1926   Care Coordination Outreach Attempts:  pt has nurse appt tomorrow at pcp and wants to check his upcoming appts before scheduling   Follow Up Plan:  Additional outreach attempts will be made to offer the patient care coordination information and services.   Encounter Outcome:  Pt. Request to Call [place], [place] Direct Dial: [phone]

## 2022-03-08 ENCOUNTER — Encounter: Payer: Self-pay | Admitting: Internal Medicine

## 2022-03-08 ENCOUNTER — Ambulatory Visit (INDEPENDENT_AMBULATORY_CARE_PROVIDER_SITE_OTHER): Payer: Medicare Other

## 2022-03-08 DIAGNOSIS — E538 Deficiency of other specified B group vitamins: Secondary | ICD-10-CM

## 2022-03-08 MED ORDER — CYANOCOBALAMIN 1000 MCG/ML IJ SOLN
1000.0000 ug | Freq: Once | INTRAMUSCULAR | Status: AC
  Administered 2022-03-08: 1000 ug via INTRAMUSCULAR

## 2022-03-08 NOTE — Progress Notes (Signed)
Pt was given B12 injection w/o any complications. 

## 2022-03-08 NOTE — Patient Instructions (Addendum)
       Medications changes include :   none    A referral was ordered for neurology for the hallucinations.     Someone will call you to schedule an appointment.    Return in about 4 months (around 07/09/2022) for follow up.

## 2022-03-08 NOTE — Progress Notes (Signed)
Subjective:    Patient ID: [name], male    DOB: 02/13/1926, 89 y.o.   MRN: [id number]     HPI [name] is here for follow up from the hospital   Admitted 02/27/22 - 03/04/22  Recommendations at discharge:     Follow up with PCP in 1-2 weeks Follow up with Nephrology as scheduled on 2/27 Follow up with [facility] as scheduled, follow up on mass near prostatectomy bed  I sent him to ED for worsening ARF and elevated LFTs.  ARF on CKD stage 4: He had urinary retention.   Foley catheter placed.   Worsening Cr.  Soft tissue mass near prostatectomy bed - not seen clearly on PET/CT due to intense radiotracer activity next to bladder.  Diuretics held He passed voiding trial. To f/u with urology  Metabolic acidosis: Likely secondary to renal disease  Acute urine retention: Continued with foley catheter S/p prostatectomy PSA normal at 0.88 Recommended to f/u with urology Mass noted on prostatectomy bed He passed voiding trial To f/u with urology  Elevated LFTs: No pain CT and RUQ US showed thickened GB wall w/o cholelithiasis. Small amt of pericholecystic fluid HIDA reviewed, no CBD dilatation, obstruction, low normal GB EF Liver appears normal, hepatitis panel neg LFTs improving Per GI elevated lfts likely related to HF - no further w/u needed  Anasarca: Unclear of cause Appetite is poor - albumin/protein dropping since TAVR Norvasc stopped  Pleural effusion on right: Diuretics tried - then held  HFpEF: Lasix x 12 months  - takes prn Good urine output with bumex and foley Bumex held -   Hypertension: Norvasc stopped Resumed home hydralazine   He sees urology Monday.  He saw nephroogy - Dr [name] yesterday.  Blood work was done yesterday   Diarrhea 4 times yesterday, 3 times today.  No abd pain, nausea.     Still having hallucinations.  While in the waiting room he saw burning fire - he knows the hallucinations are not real.   Initially he and his wife said that they started after his valve replacement, but his wife mentions today that he did have a couple prior to that  He is sleeping better.  Appetite is improving  He has had physical therapy come out to see him already.   Medications and allergies reviewed with patient and updated if appropriate.  Current Outpatient Medications on File Prior to Visit  Medication Sig Dispense Refill   acetaminophen (TYLENOL) 500 MG tablet Take 1,000 mg by mouth 3 (three) times daily as needed for moderate pain.     allopurinol (ZYLOPRIM) 100 MG tablet TAKE 1 TABLET(100 MG) BY MOUTH DAILY (Patient taking differently: Take 100 mg by mouth daily. TAKE 1 TABLET(100 MG) BY MOUTH DAILY) 30 tablet 0   Aromatic Inhalants (VICKS VAPOR INHALER IN) Place 1 puff into both nostrils as needed (for congestion).     Ascorbic Acid (VITAMIN C) 1000 MG tablet Take 1,000 mg by mouth daily.     aspirin EC 81 MG tablet Take 81 mg by mouth daily.     Calcium Citrate-Vitamin D (CITRACAL + D PO) Take 2 tablets by mouth in the morning and at bedtime.     Carboxymeth-Glycerin-Polysorb (REFRESH OPTIVE MEGA-3 OP) Place 1 drop into both eyes 2 (two) times daily.     Cholecalciferol 25 MCG (1000 UT) capsule Take 1,000 Units by mouth daily.     cyanocobalamin (,VITAMIN B-12,) 1000 MCG/ML injection Inject 1,000 mcg  into the muscle once. Monthly injection     folic acid (FOLVITE) 1 MG tablet Take 1 tablet (1 mg total) by mouth daily. Annual appt due in May must see provider for future refills (Patient taking differently: Take 1 mg by mouth at bedtime. Annual appt due in May must see provider for future refills) 90 tablet 2   furosemide (LASIX) 40 MG tablet TAKE 1 TABLET BY MOUTH EVERY DAY (Patient taking differently: Take 40 mg by mouth as needed for fluid or edema.) 90 tablet 0   hydrALAZINE (APRESOLINE) 50 MG tablet Take 1 tablet (50 mg total) by mouth 3 (three) times daily. 90 tablet 6   iron polysaccharides  (NIFEREX) 150 MG capsule Take 1 capsule (150 mg total) by mouth daily. 30 capsule 0   loratadine (CLARITIN) 10 MG tablet Take 10 mg by mouth daily as needed (for seasonal allergies).     Multiple Vitamins-Minerals (PRESERVISION AREDS 2 PO) Take 1 tablet by mouth in the morning and at bedtime.     nitroGLYCERIN (NITROSTAT) 0.4 MG SL tablet DISSOLVE 1 TABLET UNDER THE TONGUE EVERY 5 MINUTES AS NEEDED FOR CHEST PAIN, MAXIMUM 3 TABLETS (Patient taking differently: Place 0.4 mg under the tongue every 5 (five) minutes as needed for chest pain.) 100 tablet 1   Saline (AYR NASAL MIST ALLERGY/SINUS NA) Place 2 sprays into the nose as needed (dryness).     tamsulosin (FLOMAX) 0.4 MG CAPS capsule Take 1 capsule (0.4 mg total) by mouth daily after supper. 30 capsule 0   trolamine salicylate (ASPERCREME) 10 % cream Apply 1 Application topically in the morning and at bedtime. For leg cramps     Vitamin D, Ergocalciferol, (DRISDOL) 1.25 MG (50000 UNIT) CAPS capsule Take 50,000 Units by mouth every 30 (thirty) days.     benzonatate (TESSALON) 100 MG capsule Take 1 capsule (100 mg total) by mouth 3 (three) times daily as needed for cough. (Patient not taking: Reported on 03/09/2022) 20 capsule 0   Current Facility-Administered Medications on File Prior to Visit  Medication Dose Route Frequency Provider Last Rate Last Admin   NON FORMULARY 1 application  1 application  Topical PRN Landis Martins, DPM         Review of Systems  Constitutional:  Negative for fever.       Drinking fair amt of water.  Appetite is improving  Respiratory:  Positive for cough (productive of clear mucus) and shortness of breath (with exertion - chronic - same). Negative for wheezing.   Cardiovascular:  Positive for leg swelling (chronic - better). Negative for chest pain and palpitations.  Gastrointestinal:  Positive for diarrhea. Negative for abdominal pain, blood in stool and nausea.  Genitourinary:  Negative for difficulty urinating.   Neurological:  Positive for headaches (posteior head). Negative for light-headedness.       Objective:   Vitals:   03/09/22 1427  BP: 136/78  Pulse: 70  Temp: 97.7 F (36.5 C)  SpO2: 97%   BP Readings from Last 3 Encounters:  03/09/22 136/78  03/04/22 (!) 131/49  02/27/22 (!) 110/58   Wt Readings from Last 3 Encounters:  03/09/22 157 lb (71.2 kg)  03/03/22 144 lb 6.4 oz (65.5 kg)  02/27/22 164 lb (74.4 kg)   Body mass index is 22.53 kg/m.    Physical Exam     Lab Results  Component Value Date   WBC 6.6 03/03/2022   HGB 8.6 (L) 03/03/2022   HCT 24.9 (L) 03/03/2022   PLT  272 03/03/2022   GLUCOSE 110 (H) 03/04/2022   CHOL 122 09/04/2016   TRIG 59.0 09/04/2016   HDL 40.40 09/04/2016   LDLCALC 70 09/04/2016   ALT 72 (H) 03/04/2022   AST 36 03/04/2022   NA 137 03/04/2022   K 3.9 03/04/2022   CL 106 03/04/2022   CREATININE 2.82 (H) 03/04/2022   BUN 51 (H) 03/04/2022   CO2 21 (L) 03/04/2022   TSH 1.827 02/02/2018   PSA 0.44 07/18/2007   INR 1.2 02/21/2022     Assessment & Plan:    See Problem List for Assessment and Plan of chronic medical problems.

## 2022-03-09 ENCOUNTER — Ambulatory Visit: Payer: Medicare Other

## 2022-03-09 ENCOUNTER — Other Ambulatory Visit (HOSPITAL_COMMUNITY): Payer: Medicare Other

## 2022-03-09 ENCOUNTER — Ambulatory Visit (INDEPENDENT_AMBULATORY_CARE_PROVIDER_SITE_OTHER): Payer: Medicare Other | Admitting: Internal Medicine

## 2022-03-09 ENCOUNTER — Telehealth: Payer: Self-pay | Admitting: Internal Medicine

## 2022-03-09 ENCOUNTER — Encounter: Payer: Self-pay | Admitting: Internal Medicine

## 2022-03-09 VITALS — BP 136/78 | HR 70 | Temp 97.7°F | Ht 70.0 in | Wt 157.0 lb

## 2022-03-09 DIAGNOSIS — R443 Hallucinations, unspecified: Secondary | ICD-10-CM | POA: Diagnosis not present

## 2022-03-09 DIAGNOSIS — R7989 Other specified abnormal findings of blood chemistry: Secondary | ICD-10-CM | POA: Diagnosis not present

## 2022-03-09 DIAGNOSIS — N184 Chronic kidney disease, stage 4 (severe): Secondary | ICD-10-CM

## 2022-03-09 DIAGNOSIS — N179 Acute kidney failure, unspecified: Secondary | ICD-10-CM

## 2022-03-09 DIAGNOSIS — G4700 Insomnia, unspecified: Secondary | ICD-10-CM

## 2022-03-09 DIAGNOSIS — B379 Candidiasis, unspecified: Secondary | ICD-10-CM | POA: Insufficient documentation

## 2022-03-09 DIAGNOSIS — G6289 Other specified polyneuropathies: Secondary | ICD-10-CM

## 2022-03-09 DIAGNOSIS — I5032 Chronic diastolic (congestive) heart failure: Secondary | ICD-10-CM

## 2022-03-09 DIAGNOSIS — L539 Erythematous condition, unspecified: Secondary | ICD-10-CM | POA: Insufficient documentation

## 2022-03-09 DIAGNOSIS — I10 Essential (primary) hypertension: Secondary | ICD-10-CM

## 2022-03-09 NOTE — Telephone Encounter (Signed)
Shanon Brow a physical therapist from [facility] called requesting verbal orders for this patient to receive PT to help with his balance and transfers for 2 times a week for 3 weeks and 1 time a week for 1 week. Best callback number for Shanon Brow [phone].

## 2022-03-09 NOTE — Assessment & Plan Note (Signed)
Subacute Initially thought to have started after his TAVR, but his wife mentions today that he did have a couple of episodes prior to that so it does not look like it is surgery/anesthesia related which likely would have resolved by now Still having hallucinations Concern for possible dementia Referral to neurology ordered

## 2022-03-09 NOTE — Assessment & Plan Note (Signed)
Acute GI evaluated patient-no obvious GI cause GI thought the LFTs were elevated secondary to heart failure LFTs improved prior to discharge

## 2022-03-09 NOTE — Assessment & Plan Note (Signed)
Chronic Uses cane Has appointment with neurology coming up Starting physical therapy, which will hopefully help some of his balance

## 2022-03-09 NOTE — Assessment & Plan Note (Signed)
Chronic States he is sleeping better

## 2022-03-09 NOTE — Telephone Encounter (Signed)
[name] and gave verbal orders to start physical therapy.

## 2022-03-09 NOTE — Assessment & Plan Note (Addendum)
Received Bumex in the hospital-prior to that he was taking Lasix as needed for leg swelling Had good urine output while in the hospital Back to Lasix as needed Appears euvolemic.  Today-mild swelling in ankles Continue Lasix 20 mg as needed

## 2022-03-09 NOTE — Assessment & Plan Note (Addendum)
Recent hospitalization for acute kidney failure on chronic kidney function Possibly related to soft tissue mass near prostatectomy bed ? causing obstruction Had Foley catheter and had good urine output Saw kidney doctor yesterday and sees urology on Monday Kidney function improved before discharge and was checked yesterday by nephrology

## 2022-03-09 NOTE — Assessment & Plan Note (Signed)
Chronic Blood pressure well-controlled Continue hydralazine 50 mg 3 times daily Norvasc was discontinued

## 2022-03-13 DIAGNOSIS — N179 Acute kidney failure, unspecified: Secondary | ICD-10-CM | POA: Diagnosis not present

## 2022-03-13 DIAGNOSIS — N281 Cyst of kidney, acquired: Secondary | ICD-10-CM

## 2022-03-13 DIAGNOSIS — J9811 Atelectasis: Secondary | ICD-10-CM

## 2022-03-13 DIAGNOSIS — Z9079 Acquired absence of other genital organ(s): Secondary | ICD-10-CM

## 2022-03-13 DIAGNOSIS — I700 Atherosclerosis of aorta: Secondary | ICD-10-CM

## 2022-03-13 DIAGNOSIS — I5032 Chronic diastolic (congestive) heart failure: Secondary | ICD-10-CM | POA: Diagnosis not present

## 2022-03-13 DIAGNOSIS — R339 Retention of urine, unspecified: Secondary | ICD-10-CM

## 2022-03-13 DIAGNOSIS — D631 Anemia in chronic kidney disease: Secondary | ICD-10-CM

## 2022-03-13 DIAGNOSIS — Z9181 History of falling: Secondary | ICD-10-CM

## 2022-03-13 DIAGNOSIS — Z952 Presence of prosthetic heart valve: Secondary | ICD-10-CM

## 2022-03-13 DIAGNOSIS — Z8546 Personal history of malignant neoplasm of prostate: Secondary | ICD-10-CM

## 2022-03-13 DIAGNOSIS — R1909 Other intra-abdominal and pelvic swelling, mass and lump: Secondary | ICD-10-CM

## 2022-03-13 DIAGNOSIS — I130 Hypertensive heart and chronic kidney disease with heart failure and stage 1 through stage 4 chronic kidney disease, or unspecified chronic kidney disease: Secondary | ICD-10-CM | POA: Diagnosis not present

## 2022-03-13 DIAGNOSIS — E872 Acidosis, unspecified: Secondary | ICD-10-CM

## 2022-03-13 DIAGNOSIS — N184 Chronic kidney disease, stage 4 (severe): Secondary | ICD-10-CM | POA: Diagnosis not present

## 2022-03-13 DIAGNOSIS — R413 Other amnesia: Secondary | ICD-10-CM

## 2022-03-13 DIAGNOSIS — Z48812 Encounter for surgical aftercare following surgery on the circulatory system: Secondary | ICD-10-CM

## 2022-03-13 DIAGNOSIS — Z7982 Long term (current) use of aspirin: Secondary | ICD-10-CM

## 2022-03-13 DIAGNOSIS — Z950 Presence of cardiac pacemaker: Secondary | ICD-10-CM

## 2022-03-13 DIAGNOSIS — I441 Atrioventricular block, second degree: Secondary | ICD-10-CM

## 2022-03-13 NOTE — Progress Notes (Signed)
[facility] [place]                                     Cardiology Office Note:    Date:  03/15/2022   ID:  [name], DOB May 17, 1926, MRN [id number]  PCP:  [name], MD  [facility] Cardiologist:  [name], MD  [facility] Electrophysiologist:  None   Referring MD: [name], MD   Chief Complaint  Patient presents with   Follow-up    1 month s/p TAVR   History of Present Illness:    [name] is a 89 y.o. male with a hx of CABG in 2013 ([facility]) with early graft failure of all of his venous conduits and continued patency of the LIMA to LAD, CKD stage IIIb, chronic diastolic CHF, symptomatic bradycardia s/p PPM, anemia, prostate cancer, HTN, HLD, and severe AS who underwent TAVR on 02/06/22 and is being seen today for TOC follow up.    [name] has a history of RCA stenting. He ultimately was treated with multivessel CABG in 2013 with early graft failure of all of his venous conduits and continued patency of the mammary artery to LAD graft. He had a nuclear stress test in 2019 demonstrating no significant ischemia. He recently developed DOE and fatigue. Echo 12/19/22 showed EF 55-60%, mod LVH, and severe AS mean grad 26 mmHg, peak grad 40.7 mmHg, AVA 0.81 cm2, DVI 0.27, SVI 37 as well as moderate MAC with moderate MR and moderate TR. Given age, advanced CKD, and well outlined coronary anatomy, it was decided to forgo pre op coronary angiography.    He was evaluated by the multidisciplinary valve team and felt to have severe, symptomatic aortic stenosis and to be a suitable candidate and is now s/p TAVR with a 26 mm Edwards Sapien 3 Ultra Resilia THV via the TF approach. Post operative echo showed normal valve function with an AVA by VTI at 2.58cm2 and mean gradient at 7.32mHg with no PVL.    Unfortunately he called our team 02/13/22 with c/o uncontrollable "body jerking" with leg tightening and a brief  period of leg and feet numbness. He was asked to proceed to the ED for further evaluation. It appears labwork and CXR stable with no concerns for infection/sepsis. It was noted by ED provider that he has had issues with this for years, although not to this extreme. He was stable and not felt to need hospital admission and was discharged home.   He then went back to the hospital 02/21/22 with acute febrile illness complicated by AKI. He was treated with IVF and antibiotics with recommendations to follow with his PCP. Unfortunately, he presented again five days later with hallucinations found to have a Cr at 3.29, AST 175, ALT of 185, alk phos of 437, total bili 2.6. Ultrasound the right upper quadrant with thickened gallbladder wall but no acute cholelithiasis. GI and nephrology were consulted. AKI felt to be multifactorial: bladder outlet obstruction, decreased EABV in the context of vascular congestion. RHC performed 03/02/22 with normal filling pressures and mildly elevated pulm artery pressures. No cardiac reason for acute illness. At sign off, plan was for PRN diuretics and follow with nephrology.   Today he is here with his wife. Given all that has happened recently, he looks well. He reports fatigue but otherwise is doing ok. He  is starting [facility] tomorrow. He has seen his urologist and nephrologist since discharge however they are really unclear with long term plan. He goes back to Dr. Joelyn Oms in several weeks. He denies chest pain, SOB, no further fevers, no bleeding, dizziness, or syncope.   Past Medical History:  Diagnosis Date   Anemia    AV block, 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 S/P stenting to mid RCA, prox PDA 06/1999. b. NSTEMI/CABG x 3 in 10/2011 with LIMA to LAD, SVG to PDA, and SVG to OM1.    Cephalalgia 07/14/2015   CHB (complete heart block) 05/03/2012   Overview:  Status post complete heart  block heart rate 28 bpm, alternating with 2 to one AV block and drug for bradycardia.  Status post pacemaker implantation.status post Medtronic pacemaker implantation the 03/28/2012   Chronic diastolic CHF (congestive heart failure) 02/05/2018   Chronic gouty arthritis    Chronic UTI    a. Followed by Dr. Risa Grill - colonized/asymptomatic - not on abx   CKD (chronic kidney disease)    stage 3, GFR 30-59 ml/min; stable with a creatinine around 1.9-2.0 followed by nephrology.   Constipation 03/01/2015   Symptoms and exam consistent with constipation. Abdominal exam is benign with no evidence of pain or obstruction. Discussed importance of increasing fiber and water intake coupled with physical activity to assist with bowel movements. Continue over-the-counter medication management as needed. Follow-up if symptoms worsen or fail to improve.   Coronary atherosclerosis 11/27/2011   Overview:  Multivessel coronary artery disease recently diagnosed by catheterization 2013 History of stent placement with a heparin-coated stent 2001  Last Assessment & Plan:  Status post coronary bypass grafting.  No recurrent chest pain. Overview:  Overview:  S/P stenting to mid RCA, prox PDA 06/1999;  06/2007 Myoview: negative except for apical thinning, EF 68%, last Myoview August 10, 2008 with n   Degeneration of lumbar intervertebral disc    Degenerative disc disease, cervical, with radiculopathy [id number]   Diastolic dysfunction [id number]   Grade 1 DD on Echo 06/2017   Dyslipidemia    Dysphagia 02/06/2016   3/18 - DG Esophagus:  1. Mild esophageal dysmotility, likely presbyesophagus. 2. No other explanation for patient's symptoms. 3. Small hiatal hernia.  On swallow eval - evidence of cervical spine disease and that was likely contributing   Epistaxis 12/08/2020   Essential hypertension 07/20/2006   GERD (gastroesophageal reflux disease)    Gout 05/12/2018   Gouty arthritis of right great toe 09/03/2017   Left toe  Injected January 31, 2018    Hyperpigmentation 11/04/2017   Internal hemorrhoids 08/15/2016   Left inguinal hernia 07/01/2019   Lumbar post-laminectomy syndrome 12/12/2017   Moderate aortic regurgitation 07/04/2017   Echo 06/2017:  EF 55-60%, mild LVH, grade 1 DD, mild AS, mod AR, mild MR, mild-mod TR   Neck injury    a. 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  This was switched to by mouth amiodarone at the time of the thoracic surgery appointment was discontinued.   Presence of  aortocoronary bypass graft 10/24/2011   Overview:  Performed at [facility] Last Assessment & Plan:  No complication post coronary bypass grafting.   Prostate cancer ([place])    a. 2001 s/p TURP.   Pulmonary nodule    a. felt to be noncancerous.  Status post followup CT scan 4 mm and stable.   Renal artery stenosis    a. 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 Burt Knack & Dr. 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   Trochanteric bursitis of hip, bilateral 01/10/2017   UGIB (upper gastrointestinal bleed) 02/01/2018   EGD  02/06/18 - mod, non-erosive gastritis   Venous insufficiency of leg 06/05/2010   Vitamin B12 deficiency 08/23/2009   Jan '14  July '14 B12 level  >1500    472   Weakness of both lower extremities 04/09/2020     Past Surgical History:  Procedure Laterality Date   ANTERIOR CHAMBER WASHOUT Left 10/04/2018   Procedure: Anterior Chamber Washout, Vitreous Tap;  Surgeon: Jalene Mullet, MD;  Location: [place];  Service: Ophthalmology;  Laterality: Left;   cardia catherization  07-07-99   CARDIAC SURGERY  10/18/12   open heart surgery   CATARACT EXTRACTION W/ INTRAOCULAR LENS  IMPLANT, BILATERAL  3/205, 06/2013   [place]   COLONOSCOPY  04/12/07   CORONARY ARTERY BYPASS GRAFT  10/19/2011   Procedure: CORONARY ARTERY BYPASS GRAFTING (CABG);  Surgeon: Gaye Pollack, MD;  Location: [place];  Service: Open Heart Surgery;  Laterality: N/A;  times three using Left Internal Mammary Artery and Right Greater Saphenouse Vein Graft harvested Endoscopically   edg  07-17-1994   FLEXIBLE SIGMOIDOSCOPY  11-03-1997   GAS INSERTION Left 10/04/2018   Procedure: Insertion Of Gas;  Surgeon: Jalene Mullet, MD;  Location: [place];  Service: Ophthalmology;  Laterality: Left;   GAS/FLUID EXCHANGE Left 10/04/2018   Procedure: Gas/Fluid Exchange;  Surgeon: Jalene Mullet, MD;  Location: [place];  Service: Ophthalmology;  Laterality: Left;   INTRAOPERATIVE TRANSTHORACIC ECHOCARDIOGRAM N/A 02/06/2022   Procedure: INTRAOPERATIVE TRANSTHORACIC ECHOCARDIOGRAM;  Surgeon: [name], MD;  Location: [place] CV LAB;  Service: Open Heart Surgery;  Laterality: N/A;   LEFT HEART CATHETERIZATION WITH CORONARY ANGIOGRAM N/A 10/16/2011   Procedure: LEFT HEART CATHETERIZATION WITH CORONARY ANGIOGRAM;  Surgeon: Peter M Martinique, MD;  Location: [facility] CATH LAB;  Service: Cardiovascular;  Laterality: N/A;   LEFT HEART CATHETERIZATION WITH CORONARY/GRAFT ANGIOGRAM N/A 03/11/2013   Procedure: LEFT HEART CATHETERIZATION WITH [name];  Surgeon: Blane Ohara, MD;  Location: [facility] CATH LAB;  Service: Cardiovascular;  Laterality: N/A;   lumbar spinal disk and neck fusion surgery     PACEMAKER INSERTION  03/28/12   PPM implanted for mobitz II AV  block   PARS PLANA VITRECTOMY Left 10/04/2018   Procedure: PARS PLANA VITRECTOMY 25 GAUGE FOR ENDOPHTHALMITIS WITH INJECTION OF INTRAVITREAL ANTIBIOTIC;  Surgeon: Jalene Mullet, MD;  Location: [place];  Service: Ophthalmology;  Laterality: Left;   peripheral vascular catherization  11-24-03   PERMANENT PACEMAKER INSERTION N/A 03/28/2012   Procedure: PERMANENT PACEMAKER INSERTION;  Surgeon: Thompson Grayer, MD;  Location: [facility] CATH LAB;  Service: Cardiovascular;  Laterality: N/A;   PROSTATECTOMY  renal circulation  10-01-03   RIGHT HEART CATH N/A 03/02/2022   Procedure: RIGHT HEART CATH;  Surgeon: Lorretta Harp, MD;  Location: [place] CV LAB;  Service: Cardiovascular;  Laterality: N/A;   s/p ptca     stents     X 2   stress cardiolite  05-04-05   spring 09-negative except for apical thinning, EF 68%   TRANSCATHETER AORTIC VALVE REPLACEMENT, TRANSFEMORAL Right 02/06/2022   Procedure: Transcatheter Aortic Valve Replacement, Transfemoral;  Surgeon: [name], MD;  Location: [place] CV LAB;  Service: Open Heart Surgery;  Laterality: Right;    Current Medications: Current Meds  Medication Sig   amoxicillin (AMOXIL) 500 MG capsule Take 2,000 mg by mouth as directed. 1 HOUR PRIOR TO DENTAL CLEANINGS AND PROCEDURES   [DISCONTINUED] furosemide (LASIX) 40 MG tablet Take 1 tablet (40 mg total) by mouth as needed.   Current Facility-Administered Medications for the 03/14/22 encounter (Office Visit) with [facility]  Medication   NON FORMULARY 1 application     Allergies:   Aspirin, Lisinopril, Amoxicillin, Atarax [hydroxyzine hcl], Cephalexin, Ciprofloxacin, Clindamycin, Clobetasol, Codeine, Fish allergy, Fluarix [influenza virus vaccine], Haemophilus influenzae, Hydrocodone, Hydrocodone-acetaminophen, Hydroxyzine, Latex, Macrobid [nitrofurantoin macrocrystal], Niacin, Niacin-lovastatin er, Niacin-lovastatin er, Nitrofurantoin, Omeprazole, Other, Tramadol, Vibramycin  [doxycycline], Adhesive [tape], Bactrim [sulfamethoxazole-trimethoprim], Colchicine, Gabapentin, and Nortriptyline   Social History   Socioeconomic History   Marital status: Married    Spouse name: [name]   Number of children: 2   Years of education: 13   Highest education level: Some college, no degree  Occupational History   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  married '52 - 3 years, divorced; married '56 - 3 years divorced; married '73-12 yrs - divorced; married '75 -. 1 son '57; 1 daughter - '53; 1 grandchild.  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  Very happily married.  End of life care: yes CPR, no long term mechanical ventilation, no heroic measures.right handed   Right handed   Social Determinants of Health   Financial Resource Strain: Low Risk  (07/28/2021)   Overall Financial Resource Strain (CARDIA)    Difficulty of Paying Living Expenses: Not hard at all  Food Insecurity: No Food Insecurity (02/28/2022)   Hunger Vital Sign    Worried About Running Out of Food in the Last Year: Never true    Ran Out of Food in the Last Year: Never true  Transportation Needs: No Transportation Needs (03/06/2022)   PRAPARE - Hydrologist (Medical): No    Lack of Transportation (Non-Medical): No  Physical Activity: Insufficiently Active (07/28/2021)   Exercise Vital Sign    Days of Exercise per Week: 2 days    Minutes of Exercise per Session: 20 min  Stress:  No Stress Concern Present (07/28/2021)   [place]  Feeling of Stress : Not at all  Social Connections: Socially Integrated (07/28/2021)   Social Connection and Isolation Panel [NHANES]    Frequency of Communication with Friends and Family: Three times a week    Frequency of Social Gatherings with Friends and Family: Three times a week    Attends Religious Services: More than 4 times per year    Active Member of Clubs or Organizations: Yes    Attends Archivist Meetings: 1 to 4 times per year    Marital Status: Married     Family History: The patient's family history includes Arthritis in his mother; Breast cancer in an other family member; Cancer in his brother; Coronary artery disease in his father; Heart disease in his brother; Nephritis in his brother; Other in his brother and mother; Prostate cancer in his brother. There is no history of Diabetes, Colon cancer, or Adrenal disorder.  ROS:   Please see the history of present illness.    All other systems reviewed and are negative.  EKGs/Labs/Other Studies Reviewed:    The following studies were reviewed today:  Echo 03/14/22:   1. Left ventricular ejection fraction, by estimation, is 60 to 65%. The  left ventricle has normal function. The left ventricle has no regional  wall motion abnormalities. There is mild left ventricular hypertrophy.  Left ventricular diastolic parameters  are indeterminate.   2. Right ventricular systolic function is low normal. The right  ventricular size is normal. There is moderately elevated pulmonary artery  systolic pressure.   3. Mild to moderate mitral valve regurgitation. Moderate mitral annular  calcification.   4. Tricuspid valve regurgitation is severe.   5. S/p TAVR (26 mm Edwards S3UR, procedure date 02/06/22) Peak and mean  gradients through the valve are 15 and 8 mm Hg respectively.. The aortic  valve has been  repaired/replaced. Aortic valve regurgitation is not  visualized.   6. Aortic dilatation noted. There is mild dilatation of the aortic root,  measuring 39 mm. There is mild dilatation of the ascending aorta,  measuring 40 mm.   7. The inferior vena cava is dilated in size with >50% respiratory  variability, suggesting right atrial pressure of 8 mmHg.   [place] 03/02/22:  HEMODYNAMICS:    1: Right atrial pressure-11/10 2: Right ventricular pressure-21/0 3: Pulmonary artery pressure-44/13, mean 25 4: Pulmonary artery wedge pressure-A-wave 15, V wave 13, mean 10 Cardiac output-8.1 L/min with an index of 4.3 L/min/m.     IMPRESSION:Mr Mathias right heart cath suggested normal filling pressures.  He has mildly elevated pulmonary artery pressures.   TAVR OPERATIVE NOTE     Date of Procedure:                02/06/2022   Preoperative Diagnosis:      Severe Aortic Stenosis    Postoperative Diagnosis:    Same    Procedure:        Transcatheter Aortic Valve Replacement - Percutaneous  Transfemoral Approach             Edwards Sapien 3 Ultra Resilia THV (size 26 mm, serial # [id number])              Co-Surgeons:                        Coralie Common, MD and [name], MD   Anesthesiologist:  Jana Half, DO   Echocardiographer:              Marry Guan, MD   Pre-operative Echo Findings: Severe aortic stenosis Normal left ventricular systolic function   Post-operative Echo Findings: No paravalvular leak Normal/unchanged left ventricular systolic function   _____________     Echo 02/07/22:    1. The aortic valve has been repaired/replaced. Aortic valve  regurgitation is not visualized. There is a 26 mm Sapien prosthetic (TAVR)  valve present in the aortic position. Procedure Date: 02/06/2022. Echo  findings are consistent with normal structure  and function of the aortic valve prosthesis. Aortic valve area, by VTI  measures 2.58 cm. Aortic valve mean  gradient measures 7.3 mmHg. Aortic  valve Vmax measures 1.84 m/s.   2. Left ventricular ejection fraction, by estimation, is 55 to 60%. The  left ventricle has normal function. The left ventricle has no regional  wall motion abnormalities. There is mild concentric left ventricular  hypertrophy. Left ventricular diastolic  parameters are consistent with Grade II diastolic dysfunction  (pseudonormalization).   3. Right ventricular systolic function is normal. The right ventricular  size is normal. There is mildly elevated pulmonary artery systolic  pressure. The estimated right ventricular systolic pressure is [id number] mmHg.   4. Left atrial size was severely dilated.   5. The mitral valve is grossly normal. Mild to moderate mitral valve  regurgitation. No evidence of mitral stenosis.   6. The inferior vena cava is dilated in size with >50% respiratory  variability, suggesting right atrial pressure of 8 mmHg.    EKG:  EKG is not ordered today.   Recent Labs: 02/27/2022: B Natriuretic Peptide 490.4 02/28/2022: Magnesium 2.4 03/04/2022: ALT 72 03/14/2022: BUN 50; Creatinine, Ser 2.52; Hemoglobin 8.3; Platelets 270; Potassium 4.5; Sodium 142  Recent Lipid Panel    Component Value Date/Time   CHOL 122 09/04/2016 1123   TRIG 59.0 09/04/2016 1123   TRIG 107 01/16/2006 1338   HDL 40.40 09/04/2016 1123   CHOLHDL 3 09/04/2016 1123   VLDL 11.8 09/04/2016 1123   LDLCALC 70 09/04/2016 1123   Physical Exam:    VS:  BP (!) 156/72   Pulse 70   Ht '5\' 10"'$  (1.778 m)   SpO2 98%   BMI 22.53 kg/m     Wt Readings from Last 3 Encounters:  03/09/22 157 lb (71.2 kg)  03/03/22 144 lb 6.4 oz (65.5 kg)  02/27/22 164 lb (74.4 kg)    General: Elderly, NAD Lungs:Clear to ausculation bilaterally. No wheezes, rales, or rhonchi. Breathing is unlabored. Cardiovascular: RRR with S1 S2. + soft flow murmur Extremities: No edema. Neuro: Alert and oriented. No focal deficits. No facial asymmetry. MAE  spontaneously. Psych: Responds to questions appropriately with normal affect.    ASSESSMENT/PLAN:    Severe AS: Patient doing ok with NYHA class II symptoms s/p successful TAVR with a 26 mm Edwards Sapien 3 Ultra Resilia THV via the TF approach on 02/06/22. Echo today with mild to mod MR, severe TR, and stable aortic valve function with 31m S3UR with a mean gradient at 864mg, peak at 1532m with no PVL. Continue  Asprin '81mg'$  daily. He will require lifelong dental SBE. Amoxicillin sent to preferred pharm. Plan follow up with Dr. CooBurt Knack 2-3 months then with our team at one year with repeat echo.   AKI/CKD stage IIIb/obstruction: Recent admission with urinary obstruction complicated by AKI with >3.Q000111Q. BMET today appears to be back at his baseline  at 2.52. Will continue to use Lasix on a PRN basis for now as he appears euvolemic on exam. Watch weight closely. He has [place] RN coming once weekly. Follows in several week again with nephrology/urology.   Anemia: Noted to be on PO iron. Follows with PCP for this. He reports poor tolerance. I have suggested following with PCP for possible iron infusions/heme referral. Hb 8.3, Hct 26.8, RDW 123XX123  Chronic diastolic CHF: Appears euvolemic. No changes made. Continue PRN home lasix.    CAD s/p CABG: s/p CABG in 2013 ([facility]) with early graft failure of all of his venous conduits and continued patency of the LIMA to LAD. Continue medical therapy.   HTN: Stable today with no changes needed at this time.    S/p PPM: Remote device interrogation 01/09/22 with no clinically significant events. Lead parameters and battery within normal limits   Medication Adjustments/Labs and Tests Ordered: Current medicines are reviewed at length with the patient today.  Concerns regarding medicines are outlined above.  Orders Placed This Encounter  Procedures   Basic metabolic panel   CBC   EKG 12-Lead   Meds ordered this encounter  Medications   DISCONTD: furosemide (LASIX)  40 MG tablet    Sig: Take 1 tablet (40 mg total) by mouth as needed.    Dispense:  90 tablet    Refill:  3   furosemide (LASIX) 40 MG tablet    Sig: Take 1 tablet (40 mg total) by mouth as needed.    Dispense:  90 tablet    Refill:  3    Patient Instructions  Medication Instructions:  Your physician has recommended you make the following change in your medication:  DECREASE LASIX 40 MG TO AS NEEDED.  *If you need a refill on your cardiac medications before your next appointment, please call your pharmacy*   Lab Work: TODAY: BMET, CBC If you have labs (blood work) drawn today and your tests are completely normal, you will receive your results only by: [place] (if you have MyChart) OR A paper copy in the mail If you have any lab test that is abnormal or we need to change your treatment, we will call you to review the results.   Testing/Procedures: NONE   Follow-Up: At [facility], you and your health needs are our priority.  As part of our continuing mission to provide you with exceptional heart care, we have created designated Provider Care Teams.  These Care Teams include your primary Cardiologist (physician) and Advanced Practice Providers (APPs -  Physician Assistants and Nurse Practitioners) who all work together to provide you with the care you need, when you need it.  We recommend signing up for the patient portal called "MyChart".  Sign up information is provided on this After Visit Summary.  MyChart is used to connect with patients for Virtual Visits (Telemedicine).  Patients are able to view lab/test results, encounter notes, upcoming appointments, etc.  Non-urgent messages can be sent to your provider as well.   To learn more about what you can do with MyChart, go to [email or web address].    Your next appointment:   2-3 month(s)  Provider:   [name], MD     Signed, Kathyrn Drown, NP  03/15/2022 12:46 PM    [place]

## 2022-03-13 NOTE — Progress Notes (Unsigned)
  Care Coordination  Outreach Note  03/13/2022 Name: [name] MRN: [id number] DOB: Dec 27, 1926   Care Coordination Outreach Attempts: A second unsuccessful outreach was attempted today to offer the patient with information about available care coordination services as a benefit of their health plan.     Follow Up Plan:  Additional outreach attempts will be made to offer the patient care coordination information and services.   Encounter Outcome:  Pt. Request to Call [place], [place] Direct Dial: [phone]

## 2022-03-14 ENCOUNTER — Ambulatory Visit: Payer: Medicare Other | Attending: Acute Care

## 2022-03-14 ENCOUNTER — Other Ambulatory Visit: Payer: Self-pay | Admitting: Cardiology

## 2022-03-14 ENCOUNTER — Ambulatory Visit (INDEPENDENT_AMBULATORY_CARE_PROVIDER_SITE_OTHER): Payer: Medicare Other | Admitting: Cardiology

## 2022-03-14 VITALS — BP 156/72 | HR 70 | Ht 70.0 in

## 2022-03-14 DIAGNOSIS — I5032 Chronic diastolic (congestive) heart failure: Secondary | ICD-10-CM | POA: Diagnosis present

## 2022-03-14 DIAGNOSIS — N1832 Chronic kidney disease, stage 3b: Secondary | ICD-10-CM | POA: Insufficient documentation

## 2022-03-14 DIAGNOSIS — I350 Nonrheumatic aortic (valve) stenosis: Secondary | ICD-10-CM | POA: Diagnosis present

## 2022-03-14 DIAGNOSIS — Z952 Presence of prosthetic heart valve: Secondary | ICD-10-CM | POA: Diagnosis present

## 2022-03-14 DIAGNOSIS — I2510 Atherosclerotic heart disease of native coronary artery without angina pectoris: Secondary | ICD-10-CM | POA: Diagnosis present

## 2022-03-14 DIAGNOSIS — N179 Acute kidney failure, unspecified: Secondary | ICD-10-CM | POA: Diagnosis present

## 2022-03-14 LAB — ECHOCARDIOGRAM COMPLETE
AR max vel: 2.49 cm2
AV Area VTI: 2.56 cm2
AV Area mean vel: 2.41 cm2
AV Mean grad: 8 mmHg
AV Peak grad: 15.4 mmHg
Ao pk vel: 1.96 m/s
Area-P 1/2: 3.91 cm2
MV M vel: 5.73 m/s
MV Peak grad: 131.3 mmHg
S' Lateral: 3.5 cm

## 2022-03-14 MED ORDER — FUROSEMIDE 40 MG PO TABS: 40.0000 mg | ORAL_TABLET | ORAL | 3 refills | Status: DC | PRN

## 2022-03-14 NOTE — Patient Instructions (Signed)
Medication Instructions:  Your physician has recommended you make the following change in your medication:  DECREASE LASIX 40 MG TO AS NEEDED.  *If you need a refill on your cardiac medications before your next appointment, please call your pharmacy*   Lab Work: TODAY: BMET, CBC If you have labs (blood work) drawn today and your tests are completely normal, you will receive your results only by: [place] (if you have MyChart) OR A paper copy in the mail If you have any lab test that is abnormal or we need to change your treatment, we will call you to review the results.   Testing/Procedures: NONE   Follow-Up: At [facility], you and your health needs are our priority.  As part of our continuing mission to provide you with exceptional heart care, we have created designated Provider Care Teams.  These Care Teams include your primary Cardiologist (physician) and Advanced Practice Providers (APPs -  Physician Assistants and Nurse Practitioners) who all work together to provide you with the care you need, when you need it.  We recommend signing up for the patient portal called "MyChart".  Sign up information is provided on this After Visit Summary.  MyChart is used to connect with patients for Virtual Visits (Telemedicine).  Patients are able to view lab/test results, encounter notes, upcoming appointments, etc.  Non-urgent messages can be sent to your provider as well.   To learn more about what you can do with MyChart, go to [email or web address].    Your next appointment:   2-3 month(s)  Provider:   [name], MD

## 2022-03-15 LAB — CBC
Hematocrit: 26.8 % — ABNORMAL LOW (ref 37.5–51.0)
Hemoglobin: 8.3 g/dL — ABNORMAL LOW (ref 13.0–17.7)
MCH: 25.7 pg — ABNORMAL LOW (ref 26.6–33.0)
MCHC: 31 g/dL — ABNORMAL LOW (ref 31.5–35.7)
MCV: 83 fL (ref 79–97)
Platelets: 270 10*3/uL (ref 150–450)
RBC: 3.23 x10E6/uL — ABNORMAL LOW (ref 4.14–5.80)
RDW: 17 % — ABNORMAL HIGH (ref 11.6–15.4)
WBC: 5.5 10*3/uL (ref 3.4–10.8)

## 2022-03-15 LAB — BASIC METABOLIC PANEL
BUN/Creatinine Ratio: 20 (ref 10–24)
BUN: 50 mg/dL — ABNORMAL HIGH (ref 10–36)
CO2: 17 mmol/L — ABNORMAL LOW (ref 20–29)
Calcium: 10.2 mg/dL (ref 8.6–10.2)
Chloride: 110 mmol/L — ABNORMAL HIGH (ref 96–106)
Creatinine, Ser: 2.52 mg/dL — ABNORMAL HIGH (ref 0.76–1.27)
Glucose: 91 mg/dL (ref 70–99)
Potassium: 4.5 mmol/L (ref 3.5–5.2)
Sodium: 142 mmol/L (ref 134–144)
eGFR: 23 mL/min/{1.73_m2} — ABNORMAL LOW (ref >59)

## 2022-03-15 NOTE — Progress Notes (Signed)
  Care Coordination   Note   03/15/2022 Name: [name] MRN: [id number] DOB: 1926-12-30  [name] is a 89 y.o. year old male who sees Burns, Claudina Lick, MD for primary care. I reached out to [name] by phone today to offer care coordination services.  [name] was given information about Care Coordination services today including:   The Care Coordination services include support from the care team which includes your Nurse Coordinator, Clinical Social Worker, or Pharmacist.  The Care Coordination team is here to help remove barriers to the health concerns and goals most important to you. Care Coordination services are voluntary, and the patient may decline or stop services at any time by request to their care team member.   Care Coordination Consent Status: Patient did not agree to participate in care coordination services at this time.  Follow up plan:  none indicated - pt says he has many appointments and already has home health - requested a sooner appt with Dr Burt Knack - will send message admin staff.  Encounter Outcome:  Pt. Refused  Julian Hy, [place] Direct Dial: [phone]

## 2022-03-22 ENCOUNTER — Telehealth: Payer: Self-pay | Admitting: Internal Medicine

## 2022-03-22 ENCOUNTER — Telehealth: Payer: Self-pay | Admitting: Cardiology

## 2022-03-22 NOTE — Telephone Encounter (Signed)
Patient called and stated that she wanted to inform you that two nights ago he had a nose bleed off and on through out the night that lasted for four hours. He denies falling recently. Last Fall was over 2 months ago. His wife stated he told her had a headache during the nose bleed.He is not on any anticoagulants. Patient did state he have a history of nosebleeds. Currently he denies having shortness of breath, chest pain or discomfort, headache or dizziness. Gave patient instructions on how to stop his nose bleeding. instructions  I informed to contact his pcp.

## 2022-03-22 NOTE — Telephone Encounter (Signed)
Noted  

## 2022-03-22 NOTE — Telephone Encounter (Signed)
Patient is calling to speak with Kathyrn Drown or nurse. Please call back.

## 2022-03-22 NOTE — Telephone Encounter (Signed)
Fyi:  Patient called and said that he had 4 nosebleeds yesterday - He spoke to his heart doctor and they told him that he should let you know.  Patient has not had any nosebleeds since.

## 2022-03-26 ENCOUNTER — Other Ambulatory Visit (HOSPITAL_COMMUNITY): Payer: Self-pay | Admitting: *Deleted

## 2022-03-27 NOTE — Telephone Encounter (Signed)
Can take Claritin daily to see if that helps with the postnasal drip, but this will cause a little bit of drying effect in the nose so could increase the risk of nosebleeds.  For the nosebleeds he should continue the humidifier, saline nasal spray or gel, vaseline in the nostril at night

## 2022-03-27 NOTE — Telephone Encounter (Signed)
Spoke with patient and spouse and info given.

## 2022-03-27 NOTE — Telephone Encounter (Signed)
I spoke with PT's spouse today regarding an appointment that needed to be rescheduled. During this interaction PT's [place] was in [facility]) and wanted to update [place] on PT's status.  PT not dealing with anymore nose bleeds however they do have some bad post nasal drip and cough. Currently using their humidifier but wanting to know if there is anything else they could be doing.  CB: [phone]

## 2022-03-28 ENCOUNTER — Ambulatory Visit (HOSPITAL_COMMUNITY)
Admission: RE | Admit: 2022-03-28 | Discharge: 2022-03-28 | Disposition: A | Discharge status: Home / Self Care | Payer: Medicare Other | Source: Ambulatory Visit | Attending: Nephrology | Admitting: Nephrology

## 2022-03-28 VITALS — BP 179/88 | HR 59 | Temp 97.2°F | Resp 17 | Wt 151.0 lb

## 2022-03-28 DIAGNOSIS — N183 Chronic kidney disease, stage 3 unspecified: Secondary | ICD-10-CM | POA: Insufficient documentation

## 2022-03-28 LAB — POCT HEMOGLOBIN-HEMACUE: Hemoglobin: 9.7 g/dL — ABNORMAL LOW (ref 13.0–17.0)

## 2022-03-28 MED ORDER — EPOETIN ALFA-EPBX 10000 UNIT/ML IJ SOLN: 20000.0000 [IU] | INTRAMUSCULAR | Status: DC

## 2022-03-28 MED ORDER — EPOETIN ALFA-EPBX 10000 UNIT/ML IJ SOLN
INTRAMUSCULAR | Status: AC
  Administered 2022-03-28: 20000 [IU] via SUBCUTANEOUS
  Filled 2022-03-28: qty 2

## 2022-03-28 MED ORDER — SODIUM CHLORIDE 0.9 % IV SOLN
510.0000 mg | INTRAVENOUS | Status: DC
  Administered 2022-03-28: 510 mg via INTRAVENOUS
  Filled 2022-03-28: qty 17

## 2022-04-01 ENCOUNTER — Other Ambulatory Visit: Payer: Self-pay

## 2022-04-01 ENCOUNTER — Emergency Department (HOSPITAL_COMMUNITY): Payer: Medicare Other

## 2022-04-01 ENCOUNTER — Emergency Department (HOSPITAL_COMMUNITY)
Admission: EM | Admit: 2022-04-01 | Discharge: 2022-04-01 | Disposition: A | Discharge status: Home / Self Care | Payer: Medicare Other | Attending: Emergency Medicine | Admitting: Emergency Medicine

## 2022-04-01 DIAGNOSIS — I2510 Atherosclerotic heart disease of native coronary artery without angina pectoris: Secondary | ICD-10-CM | POA: Insufficient documentation

## 2022-04-01 DIAGNOSIS — R410 Disorientation, unspecified: Secondary | ICD-10-CM | POA: Diagnosis not present

## 2022-04-01 DIAGNOSIS — Z7982 Long term (current) use of aspirin: Secondary | ICD-10-CM | POA: Diagnosis not present

## 2022-04-01 DIAGNOSIS — R040 Epistaxis: Secondary | ICD-10-CM | POA: Diagnosis not present

## 2022-04-01 DIAGNOSIS — I129 Hypertensive chronic kidney disease with stage 1 through stage 4 chronic kidney disease, or unspecified chronic kidney disease: Secondary | ICD-10-CM | POA: Diagnosis not present

## 2022-04-01 DIAGNOSIS — I1A0 Resistant hypertension: Secondary | ICD-10-CM

## 2022-04-01 DIAGNOSIS — Z79899 Other long term (current) drug therapy: Secondary | ICD-10-CM | POA: Insufficient documentation

## 2022-04-01 DIAGNOSIS — N189 Chronic kidney disease, unspecified: Secondary | ICD-10-CM | POA: Insufficient documentation

## 2022-04-01 DIAGNOSIS — Z91040 Latex allergy status: Secondary | ICD-10-CM | POA: Insufficient documentation

## 2022-04-01 DIAGNOSIS — D649 Anemia, unspecified: Secondary | ICD-10-CM | POA: Insufficient documentation

## 2022-04-01 DIAGNOSIS — I6381 Other cerebral infarction due to occlusion or stenosis of small artery: Secondary | ICD-10-CM | POA: Insufficient documentation

## 2022-04-01 LAB — CBC WITH DIFFERENTIAL/PLATELET
Abs Immature Granulocytes: 0.03 10*3/uL (ref 0.00–0.07)
Basophils Absolute: 0.1 10*3/uL (ref 0.0–0.1)
Basophils Relative: 1 %
Eosinophils Absolute: 0.2 10*3/uL (ref 0.0–0.5)
Eosinophils Relative: 4 %
HCT: 26.9 % — ABNORMAL LOW (ref 39.0–52.0)
Hemoglobin: 8.3 g/dL — ABNORMAL LOW (ref 13.0–17.0)
Immature Granulocytes: 1 %
Lymphocytes Relative: 13 %
Lymphs Abs: 0.7 10*3/uL (ref 0.7–4.0)
MCH: 25.9 pg — ABNORMAL LOW (ref 26.0–34.0)
MCHC: 30.9 g/dL (ref 30.0–36.0)
MCV: 83.8 fL (ref 80.0–100.0)
Monocytes Absolute: 0.7 10*3/uL (ref 0.1–1.0)
Monocytes Relative: 13 %
Neutro Abs: 4 10*3/uL (ref 1.7–7.7)
Neutrophils Relative %: 68 %
Platelets: 175 10*3/uL (ref 150–400)
RBC: 3.21 MIL/uL — ABNORMAL LOW (ref 4.22–5.81)
RDW: 20 % — ABNORMAL HIGH (ref 11.5–15.5)
WBC: 5.7 10*3/uL (ref 4.0–10.5)
nRBC: 0 % (ref 0.0–0.2)

## 2022-04-01 LAB — BASIC METABOLIC PANEL
Anion gap: 9 (ref 5–15)
BUN: 46 mg/dL — ABNORMAL HIGH (ref 8–23)
CO2: 20 mmol/L — ABNORMAL LOW (ref 22–32)
Calcium: 9.7 mg/dL (ref 8.9–10.3)
Chloride: 110 mmol/L (ref 98–111)
Creatinine, Ser: 2.31 mg/dL — ABNORMAL HIGH (ref 0.61–1.24)
GFR, Estimated: 26 mL/min — ABNORMAL LOW (ref >60)
Glucose, Bld: 96 mg/dL (ref 70–99)
Potassium: 3.9 mmol/L (ref 3.5–5.1)
Sodium: 139 mmol/L (ref 135–145)

## 2022-04-01 MED ORDER — HYDRALAZINE HCL 25 MG PO TABS: 75.0000 mg | ORAL_TABLET | Freq: Three times a day (TID) | ORAL | 0 refills | Status: DC

## 2022-04-01 MED ORDER — CEPHALEXIN 250 MG PO CAPS: 250.0000 mg | ORAL_CAPSULE | Freq: Two times a day (BID) | ORAL | 0 refills | Status: DC

## 2022-04-01 MED ORDER — HYDRALAZINE HCL 20 MG/ML IJ SOLN
5.0000 mg | Freq: Once | INTRAMUSCULAR | Status: AC
  Administered 2022-04-01: 5 mg via INTRAVENOUS
  Filled 2022-04-01: qty 1

## 2022-04-01 MED ORDER — OXYMETAZOLINE HCL 0.05 % NA SOLN
1.0000 | Freq: Once | NASAL | Status: AC
  Administered 2022-04-01: 1 via NASAL
  Filled 2022-04-01: qty 30

## 2022-04-01 MED ORDER — LIDOCAINE-EPINEPHRINE-TETRACAINE (LET) TOPICAL GEL
3.0000 mL | Freq: Once | TOPICAL | Status: AC
  Administered 2022-04-01: 3 mL via TOPICAL
  Filled 2022-04-01: qty 3

## 2022-04-01 MED ORDER — HYDRALAZINE HCL 25 MG PO TABS
50.0000 mg | ORAL_TABLET | Freq: Once | ORAL | Status: AC
  Administered 2022-04-01: 50 mg via ORAL
  Filled 2022-04-01: qty 2

## 2022-04-01 MED ORDER — LABETALOL HCL 5 MG/ML IV SOLN
10.0000 mg | Freq: Once | INTRAVENOUS | Status: AC
  Administered 2022-04-01: 10 mg via INTRAVENOUS
  Filled 2022-04-01: qty 4

## 2022-04-01 NOTE — ED Notes (Signed)
Pt has rhino rocket on left side of nostril. Bleeding is controlled at this time.

## 2022-04-01 NOTE — ED Provider Notes (Signed)
   Clinical Course as of 04/01/22 1003  Sun Apr 01, 2022  [id number] Received sign out pending CT head. Patient presented with epistaxis. Wife initially did not state this but now complaining he has been confused for last 3 days. Mildly slow to respond but otherwise normal neurologic exam [WS]  1002 Blood pressure improved with further treatment.  Reassessed patient, CT scan negative.  Wife reports patient is currently at his baseline.  He is only on hydralazine for hypertension.  Will increase to 75 mg 3 times daily.  Place cardiology referral.  Wife reports she will call his ENT physician for follow-up this week. Will discharge patient to home. All questions answered. Patient comfortable with plan of discharge. Return precautions discussed with patient and specified on the after visit summary.  [WS]    Clinical Course User Index [WS] Cristie Hem, MD   Medical Decision Making Amount and/or Complexity of Data Reviewed Labs: ordered. Radiology: ordered.  Risk OTC drugs. Prescription drug management.          Cristie Hem, MD 04/01/22 1003

## 2022-04-01 NOTE — ED Provider Notes (Signed)
[facility] Provider Note   CSN: [id number] Arrival date & time: 04/01/22  [id number]     History  Chief Complaint  Patient presents with   Epistaxis    Patient to ED via EMS from home where he lives with his wife. EMS reports patient woke up with a nose bleed and believes that the nose bleed is some how related to the hydralazine he began taking a month ago.Patient denies taking blood thinners.    [name] is a 89 y.o. male.  The history is provided by the patient, the EMS personnel and medical records.  Epistaxis [name] is a 89 y.o. male who presents to the Emergency Department complaining of epistaxis.  Per the emergency department by EMS for evaluation of epistaxis that started around 1 AM.  He reports profuse bleeding from his left nostril.  He has had similar episodes in the past but none recently.  He did recently have a transcatheter aortic valve replacement on January 30.  No recent illnesses.  No reported injuries.     Home Medications Prior to Admission medications   Medication Sig Start Date End Date Taking? Authorizing Provider  acetaminophen (TYLENOL) 500 MG tablet Take 1,000 mg by mouth 3 (three) times daily as needed for moderate pain.    [provider]  allopurinol (ZYLOPRIM) 100 MG tablet TAKE 1 TABLET(100 MG) BY MOUTH DAILY Patient taking differently: Take 100 mg by mouth daily. TAKE 1 TABLET(100 MG) BY MOUTH DAILY 12/04/21   [name], MD  amoxicillin (AMOXIL) 500 MG capsule Take 2,000 mg by mouth as directed. 1 HOUR PRIOR TO DENTAL CLEANINGS AND PROCEDURES    [provider]  Aromatic Inhalants (VICKS VAPOR INHALER IN) Place 1 puff into both nostrils as needed (for congestion).    [provider]  Ascorbic Acid (VITAMIN C) 1000 MG tablet Take 1,000 mg by mouth daily.    [provider]  aspirin EC 81 MG tablet Take 81 mg by mouth daily.    [provider]  Calcium Citrate-Vitamin D (CITRACAL + D PO) Take 2 tablets by mouth in the morning and at bedtime.    [provider]  Carboxymeth-Glycerin-Polysorb (REFRESH OPTIVE MEGA-3 OP) Place 1 drop into both eyes 2 (two) times daily.    [provider]  cephALEXin (KEFLEX) 250 MG capsule Take 1 capsule (250 mg total) by mouth 2 (two) times daily. 04/01/22   Quintella Reichert, MD  Cholecalciferol 25 MCG (1000 UT) capsule Take 1,000 Units by mouth daily.    [provider]  cyanocobalamin (,VITAMIN B-12,) 1000 MCG/ML injection Inject 1,000 mcg into the muscle once. Monthly injection    [provider]  folic acid (FOLVITE) 1 MG tablet Take 1 tablet (1 mg total) by mouth daily. Annual appt due in May must see provider for future refills Patient taking differently: Take 1 mg by mouth at bedtime. Annual appt due in May must see provider for future refills 12/04/21   [name], MD  furosemide (LASIX) 40 MG tablet Take 1 tablet (40 mg total) by mouth as needed. 03/14/22 06/12/22  Kathyrn Drown D, NP  hydrALAZINE (APRESOLINE) 50 MG tablet Take 1 tablet (50 mg total) by mouth 3 (three) times daily. 02/08/22   Eileen Stanford, PA-C  iron polysaccharides (NIFEREX) 150 MG capsule Take 1 capsule (150 mg total) by mouth daily. 03/05/22 04/04/22  Donne Hazel, MD  loratadine (CLARITIN) 10  MG tablet Take 10 mg by mouth daily as needed (for seasonal allergies).    [provider]  Multiple Vitamins-Minerals (PRESERVISION AREDS 2 PO) Take 1 tablet by mouth in the morning and at bedtime.    [provider]  nitroGLYCERIN (NITROSTAT) 0.4 MG SL tablet DISSOLVE 1 TABLET UNDER THE TONGUE EVERY 5 MINUTES AS NEEDED FOR CHEST PAIN, MAXIMUM 3 TABLETS Patient taking differently: Place 0.4 mg under the tongue every 5 (five) minutes as needed for chest pain. 04/30/19   [name], MD  Saline (AYR NASAL MIST ALLERGY/SINUS NA) Place 2 sprays into the nose as needed (dryness).     [provider]  trolamine salicylate (ASPERCREME) 10 % cream Apply 1 Application topically in the morning and at bedtime. For leg cramps    [provider]  Vitamin D, Ergocalciferol, (DRISDOL) 1.25 MG (50000 UNIT) CAPS capsule Take 50,000 Units by mouth every 30 (thirty) days. 07/13/21   [provider]      Allergies    Aspirin, Lisinopril, Amoxicillin, Atarax [hydroxyzine hcl], Cephalexin, Ciprofloxacin, Clindamycin, Clobetasol, Codeine, Fish allergy, Fluarix [influenza virus vaccine], Haemophilus influenzae, Hydrocodone, Hydrocodone-acetaminophen, Hydroxyzine, Latex, Macrobid [nitrofurantoin macrocrystal], Niacin, Niacin-lovastatin er, Niacin-lovastatin er, Nitrofurantoin, Omeprazole, Other, Tramadol, Vibramycin [doxycycline], Adhesive [tape], Bactrim [sulfamethoxazole-trimethoprim], Colchicine, Gabapentin, and Nortriptyline    Review of Systems   Review of Systems  HENT:  Positive for nosebleeds.   All other systems reviewed and are negative.   Physical Exam Updated Vital Signs BP (!) 185/78   Pulse 62   Temp 97.8 F (36.6 C) (Oral)   Resp 18   Ht 5\' 10"  (1.778 m)   Wt 68.5 kg   SpO2 99%   BMI 21.67 kg/m  Physical Exam Vitals and nursing note reviewed.  Constitutional:      Appearance: He is well-developed.  HENT:     Head: Normocephalic.     Comments: Large amount of bright red blood from bilateral nostrils, left greater than right.  A large amount of bright red blood from mouth with large clots coming from the mouth Cardiovascular:     Rate and Rhythm: Normal rate and regular rhythm.     Heart sounds: No murmur heard. Pulmonary:     Effort: Pulmonary effort is normal. No respiratory distress.     Breath sounds: Normal breath sounds.  Abdominal:     Palpations: Abdomen is soft.     Tenderness: There is no abdominal tenderness. There is no guarding or rebound.  Musculoskeletal:        General: No swelling or tenderness.  Skin:    General:  Skin is warm and dry.  Neurological:     Mental Status: He is alert and oriented to person, place, and time.     Comments: 5 out of 5 strength in all 4 extremities.  Psychiatric:        Behavior: Behavior normal.     ED Results / Procedures / Treatments   Labs (all labs ordered are listed, but only abnormal results are displayed) Labs Reviewed  BASIC METABOLIC PANEL - Abnormal; Notable for the following components:      Result Value   CO2 20 (*)    BUN 46 (*)    Creatinine, Ser 2.31 (*)    GFR, Estimated 26 (*)    All other components within normal limits  CBC WITH DIFFERENTIAL/PLATELET - Abnormal; Notable for the following components:   RBC 3.21 (*)    Hemoglobin 8.3 (*)    HCT  26.9 (*)    MCH 25.9 (*)    RDW 20.0 (*)    All other components within normal limits  TYPE AND SCREEN    EKG None  Radiology No results found.  Procedures .Epistaxis Management  Date/Time: 04/01/2022 6:48 AM  Performed by: Quintella Reichert, MD Authorized by: Quintella Reichert, MD   Consent:    Consent obtained:  Verbal   Consent given by:  Patient   Risks discussed:  Bleeding, nasal injury and pain Anesthesia:    Anesthesia method:  Topical application   Topical anesthetic:  LET Procedure details:    Treatment site:  Unable to specify   Repair method: 7.5 cm rhino rocket.   Treatment complexity:  Extensive Post-procedure details:    Assessment:  Bleeding stopped   Procedure completion:  Tolerated     Medications Ordered in ED Medications  labetalol (NORMODYNE) injection 10 mg (has no administration in time range)  oxymetazoline (AFRIN) 0.05 % nasal spray 1 spray (1 spray Each Nare Given 04/01/22 0350)  lidocaine-EPINEPHrine-tetracaine (LET) topical gel (3 mLs Topical Given 04/01/22 0350)  hydrALAZINE (APRESOLINE) tablet 50 mg (50 mg Oral Given 04/01/22 0531)  hydrALAZINE (APRESOLINE) injection 5 mg (5 mg Intravenous Given 04/01/22 [id number])    ED Course/ Medical Decision Making/ A&P                              Medical Decision Making Amount and/or Complexity of Data Reviewed Labs: ordered. Radiology: ordered.  Risk OTC drugs. Prescription drug management.   Patient with history of recent TAVR, CKD, hypertension, coronary artery disease here for evaluation of epistaxis that started prior to ED arrival.  Patient with significant bleeding from the left nare with slight bleeding from the right nare and large amount of clots and blood from the oropharynx.  He had a 7.5 cm Rhino Rocket placed in the left nare wound was observed in the emergency department for several hours without recurrent bleeding.  Patient did remain significantly hypertensive.  He is due for his hydralazine.  He was provided p.o. hydralazine and had persistent hypertension and was treated with IV hydralazine.  Labs with stable anemia and stable renal insufficiency.  Patient's wife did comment several hours into patient's ED stay that she is concerned that he was confused since Saturday with difficulty recognizing people and slow to answer questions.  She states that earlier this month he did have hallucinations and those have since resolved.  She has significant concern about his blood pressure as it has not been checked at home and he did have a nosebleed 2 weeks ago as well.  He does have persistent hypertension despite hydralazine administration, will provide a dose of labetalol and check a CT head given new reports of confusion over the last 2 days.     Patient does have multiple medication allergies listed although under description these appear to be intolerances and side effects.  Patient is unsure what his specific allergies are-recommend prophylactic antibiotic given his nasal packing.  Will prescribe Keflex but discussed with patient and wife that they do need to review his home medications and allergies to confirm that this is a safe agent for him.  If they identify that this is not a medication that he  can tolerate pharmacy staff can reach out to the emergency department for medication change.        Final Clinical Impression(s) / ED Diagnoses Final diagnoses:  Left-sided epistaxis  Rx / DC Orders ED Discharge Orders          Ordered    cephALEXin (KEFLEX) 250 MG capsule  2 times daily,   Status:  Discontinued       Note to Pharmacy: Patient and wife could not confirm true allergies in the Emergency Department - Please have them confirm that he is able to take this medication without true allergy (our computer lists nausea as a side effect).   04/01/22 0648    cephALEXin (KEFLEX) 250 MG capsule  2 times daily       Note to Pharmacy: Patient and wife could not confirm true allergies in the Emergency Department - Please have them confirm that he is able to take this medication without true allergy (our computer lists nausea as a side effect).   04/01/22 [id number]              Quintella Reichert, MD 04/01/22 [phone]

## 2022-04-01 NOTE — ED Notes (Signed)
Patient transported to CT 

## 2022-04-01 NOTE — Discharge Instructions (Addendum)
We evaluated you for your nosebleed.  Your nosebleed stopped after we inserted a packing device.  Please follow-up with Dr. Benjamine Mola.  We have prescribed you antibiotics to help prevent infection.  Your blood pressure was elevated in the emergency department.  We would like to increase your blood pressure medication.  Please take 75 mg of hydralazine 3 times a day instead of 50 mg of hydralazine.  We placed a referral for expedited cardiology follow-up.  Please call if you do not hear back from them.  If there are any worsening symptoms or new symptoms such as fevers or chills, chest pain, shortness of breath, abdominal pain, headaches, vision changes, numbness or tingling, facial droop, weakness, leg swelling, or any other concerning symptoms, please return to the emergency department.

## 2022-04-03 ENCOUNTER — Encounter (HOSPITAL_COMMUNITY): Payer: Self-pay

## 2022-04-03 ENCOUNTER — Encounter: Payer: Self-pay | Admitting: Neurology

## 2022-04-03 ENCOUNTER — Ambulatory Visit (INDEPENDENT_AMBULATORY_CARE_PROVIDER_SITE_OTHER): Payer: Medicare Other | Admitting: Neurology

## 2022-04-03 ENCOUNTER — Telehealth: Payer: Self-pay | Admitting: Cardiovascular Disease

## 2022-04-03 VITALS — BP 185/92 | HR 69 | Ht 70.0 in | Wt 151.5 lb

## 2022-04-03 DIAGNOSIS — G6289 Other specified polyneuropathies: Secondary | ICD-10-CM | POA: Diagnosis not present

## 2022-04-03 DIAGNOSIS — R29898 Other symptoms and signs involving the musculoskeletal system: Secondary | ICD-10-CM

## 2022-04-03 DIAGNOSIS — R443 Hallucinations, unspecified: Secondary | ICD-10-CM | POA: Diagnosis not present

## 2022-04-03 DIAGNOSIS — R413 Other amnesia: Secondary | ICD-10-CM

## 2022-04-03 NOTE — Telephone Encounter (Signed)
Spoke with [name] who reports pt recently in hospital for CHF, AK failure and uncontrolled HTN.  He was started on Hydralazine 50 mg BID and BP seemed fairly well controlled.  Last week pt started having nose bleeds but BO ok.  Over the  weekend he had another nosebleed they couldn't get to stop.  Went to ED - BP 190s/90-100.  Hydralazine was increased to 75 mg TID.  No current nosebleeds. Today BP 180-184/78.  Wt down 2 lbs from lst week at 146.5 lbs, lungs clear, no edema 02 sats 99%.  Per wife - th only thing that has changed/different is he had iron infusion and "a shot"  Pt is scheduled for f/u 04/10/22 with [name].  Advised to continue to monitor BP.  Aware I will forward this information to Dr [name] and [name] for their information.
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
## 2022-04-03 NOTE — Telephone Encounter (Signed)
Attempted phone call to [place] with [facility].  Left voicemail message to contact triage at [phone].

## 2022-04-03 NOTE — Telephone Encounter (Signed)
Pt c/o BP issue: STAT if pt c/o blurred vision, one-sided weakness or slurred speech  1. What are your last 5 BP readings?  160/80 122/60 150/60 140/70  2. Are you having any other symptoms (ex. Dizziness, headache, blurred vision, passed out)? Nose bleeds  3. What is your BP issue? [facility] is calling stating patient was in hospital recently they changed patient's medication, hydrALAZINE (APRESOLINE) 25 MG tablet, to 75 mg tabs, but patient BP doesn't seem to be getting better. Requesting call back to discuss.

## 2022-04-03 NOTE — Patient Instructions (Addendum)
Monitor the BP, keep a check, if continues to be elevated need to see your primary care doctor   Continue with physical therapy  Keep appointment with Dr. Krista Blue in May   Check labs today

## 2022-04-04 ENCOUNTER — Telehealth: Payer: Self-pay

## 2022-04-04 LAB — TYPE AND SCREEN
ABO/RH(D): O POS
Antibody Screen: POSITIVE
DAT, IgG: NEGATIVE
Unit division: 0
Unit division: 0
Unit division: 0

## 2022-04-04 LAB — TSH: TSH: 1.94 u[IU]/mL (ref 0.450–4.500)

## 2022-04-04 LAB — BPAM RBC
Blood Product Expiration Date: 202404202359
Blood Product Expiration Date: 202404282359
Blood Product Expiration Date: 202404282359
ISSUE DATE / TIME: 202403250327
Unit Type and Rh: 5100
Unit Type and Rh: 5100
Unit Type and Rh: 5100

## 2022-04-04 LAB — RPR: RPR Ser Ql: NONREACTIVE

## 2022-04-04 LAB — VITAMIN B12: Vitamin B-12: 884 pg/mL (ref 232–1245)

## 2022-04-04 NOTE — Telephone Encounter (Signed)
Pt verified by name and DOB,  normal results given per provider, pt voiced understanding all question answered. °

## 2022-04-04 NOTE — Telephone Encounter (Signed)
Left msg for pt to contact us regarding recent lab results

## 2022-04-05 ENCOUNTER — Emergency Department (HOSPITAL_COMMUNITY)
Admission: EM | Admit: 2022-04-05 | Discharge: 2022-04-05 | Disposition: A | Discharge status: Home / Self Care | Payer: Medicare Other | Attending: Emergency Medicine | Admitting: Emergency Medicine

## 2022-04-05 ENCOUNTER — Encounter (HOSPITAL_COMMUNITY): Payer: Self-pay | Admitting: Emergency Medicine

## 2022-04-05 ENCOUNTER — Ambulatory Visit: Payer: Medicare Other

## 2022-04-05 DIAGNOSIS — R040 Epistaxis: Secondary | ICD-10-CM | POA: Diagnosis not present

## 2022-04-05 DIAGNOSIS — Z79899 Other long term (current) drug therapy: Secondary | ICD-10-CM | POA: Insufficient documentation

## 2022-04-05 DIAGNOSIS — Z91040 Latex allergy status: Secondary | ICD-10-CM | POA: Diagnosis not present

## 2022-04-05 DIAGNOSIS — I10 Essential (primary) hypertension: Secondary | ICD-10-CM | POA: Diagnosis not present

## 2022-04-05 DIAGNOSIS — Z7982 Long term (current) use of aspirin: Secondary | ICD-10-CM | POA: Insufficient documentation

## 2022-04-05 LAB — COMPREHENSIVE METABOLIC PANEL
ALT: 14 U/L (ref 0–44)
AST: 58 U/L — ABNORMAL HIGH (ref 15–41)
Albumin: 3.6 g/dL (ref 3.5–5.0)
Alkaline Phosphatase: 71 U/L (ref 38–126)
Anion gap: 9 (ref 5–15)
BUN: 44 mg/dL — ABNORMAL HIGH (ref 8–23)
CO2: 19 mmol/L — ABNORMAL LOW (ref 22–32)
Calcium: 8.8 mg/dL — ABNORMAL LOW (ref 8.9–10.3)
Chloride: 107 mmol/L (ref 98–111)
Creatinine, Ser: 2.34 mg/dL — ABNORMAL HIGH (ref 0.61–1.24)
GFR, Estimated: 25 mL/min — ABNORMAL LOW (ref >60)
Glucose, Bld: 97 mg/dL (ref 70–99)
Potassium: 5.2 mmol/L — ABNORMAL HIGH (ref 3.5–5.1)
Sodium: 135 mmol/L (ref 135–145)
Total Bilirubin: 0.9 mg/dL (ref 0.3–1.2)
Total Protein: 6.1 g/dL — ABNORMAL LOW (ref 6.5–8.1)

## 2022-04-05 LAB — CBC WITH DIFFERENTIAL/PLATELET
Abs Immature Granulocytes: 0.06 10*3/uL (ref 0.00–0.07)
Basophils Absolute: 0.1 10*3/uL (ref 0.0–0.1)
Basophils Relative: 1 %
Eosinophils Absolute: 0.3 10*3/uL (ref 0.0–0.5)
Eosinophils Relative: 5 %
HCT: 24.4 % — ABNORMAL LOW (ref 39.0–52.0)
Hemoglobin: 8.3 g/dL — ABNORMAL LOW (ref 13.0–17.0)
Immature Granulocytes: 1 %
Lymphocytes Relative: 12 %
Lymphs Abs: 0.8 10*3/uL (ref 0.7–4.0)
MCH: 27.7 pg (ref 26.0–34.0)
MCHC: 34 g/dL (ref 30.0–36.0)
MCV: 81.3 fL (ref 80.0–100.0)
Monocytes Absolute: 0.7 10*3/uL (ref 0.1–1.0)
Monocytes Relative: 11 %
Neutro Abs: 4.5 10*3/uL (ref 1.7–7.7)
Neutrophils Relative %: 70 %
Platelets: 321 10*3/uL (ref 150–400)
RBC: 3 MIL/uL — ABNORMAL LOW (ref 4.22–5.81)
RDW: 22.4 % — ABNORMAL HIGH (ref 11.5–15.5)
WBC: 6.5 10*3/uL (ref 4.0–10.5)
nRBC: 0 % (ref 0.0–0.2)

## 2022-04-05 LAB — PROTIME-INR
INR: 1.1 (ref 0.8–1.2)
Prothrombin Time: 13.7 seconds (ref 11.4–15.2)

## 2022-04-05 MED ORDER — AMLODIPINE BESYLATE 5 MG PO TABS: 5.0000 mg | ORAL_TABLET | Freq: Every day | ORAL | 0 refills | Status: DC

## 2022-04-05 MED ORDER — HYDRALAZINE HCL 20 MG/ML IJ SOLN
5.0000 mg | Freq: Once | INTRAMUSCULAR | Status: AC
  Administered 2022-04-05: 5 mg via INTRAVENOUS
  Filled 2022-04-05: qty 1

## 2022-04-05 MED ORDER — AMLODIPINE BESYLATE 5 MG PO TABS
5.0000 mg | ORAL_TABLET | Freq: Once | ORAL | Status: AC
  Administered 2022-04-05: 5 mg via ORAL
  Filled 2022-04-05: qty 1

## 2022-04-05 MED ORDER — OXYMETAZOLINE HCL 0.05 % NA SOLN
3.0000 | Freq: Once | NASAL | Status: AC
  Administered 2022-04-05: 3 via NASAL
  Filled 2022-04-05: qty 30

## 2022-04-05 NOTE — ED Provider Notes (Addendum)
[facility] Provider Note   CSN: [id number] Arrival date & time: 04/05/22  0115     History  Chief Complaint  Patient presents with   Epistaxis    [name] is a 89 y.o. male.  89 yo M here with hypertension and epistaxis. Here for similar last week. Was packed, increased hydralazine and then packing fell out Monday. Saw ENT (Dr. Benjamine Mola) on Tuesday and was cauterized. Recurrent bleed tonight so came here for eval. HTN with no other symptoms. No blood thinners. Wife wants to address better blood pressure control.    Epistaxis      Home Medications Prior to Admission medications   Medication Sig Start Date End Date Taking? Authorizing Provider  amLODipine (NORVASC) 5 MG tablet Take 1 tablet (5 mg total) by mouth daily. 04/05/22  Yes Jillann Charette, [name], MD  acetaminophen (TYLENOL) 500 MG tablet Take 1,000 mg by mouth 3 (three) times daily as needed for moderate pain.    [provider]  allopurinol (ZYLOPRIM) 100 MG tablet TAKE 1 TABLET(100 MG) BY MOUTH DAILY Patient taking differently: Take 100 mg by mouth daily. TAKE 1 TABLET(100 MG) BY MOUTH DAILY 12/04/21   [name], MD  amoxicillin (AMOXIL) 500 MG capsule Take 2,000 mg by mouth as directed. 1 HOUR PRIOR TO DENTAL CLEANINGS AND PROCEDURES    [provider]  Aromatic Inhalants (VICKS VAPOR INHALER IN) Place 1 puff into both nostrils as needed (for congestion).    [provider]  Ascorbic Acid (VITAMIN C) 1000 MG tablet Take 1,000 mg by mouth daily.    [provider]  aspirin EC 81 MG tablet Take 81 mg by mouth daily.    [provider]  Calcium Citrate-Vitamin D (CITRACAL + D PO) Take 2 tablets by mouth in the morning and at bedtime.    [provider]  Carboxymeth-Glycerin-Polysorb (REFRESH OPTIVE MEGA-3 OP) Place 1 drop into both eyes 2 (two) times daily.    [provider]  Cholecalciferol 25 MCG (1000 UT)  capsule Take 1,000 Units by mouth daily.    [provider]  cyanocobalamin (,VITAMIN B-12,) 1000 MCG/ML injection Inject 1,000 mcg into the muscle once. Monthly injection    [provider]  folic acid (FOLVITE) 1 MG tablet Take 1 tablet (1 mg total) by mouth daily. Annual appt due in May must see provider for future refills Patient taking differently: Take 1 mg by mouth at bedtime. Annual appt due in May must see provider for future refills 12/04/21   [name], MD  furosemide (LASIX) 40 MG tablet Take 1 tablet (40 mg total) by mouth as needed. 03/14/22 06/12/22  Kathyrn Drown D, NP  hydrALAZINE (APRESOLINE) 25 MG tablet Take 3 tablets (75 mg total) by mouth 3 (three) times daily for 14 days. 04/01/22 04/15/22  Cristie Hem, MD  loratadine (CLARITIN) 10 MG tablet Take 10 mg by mouth daily as needed (for seasonal allergies).    [provider]  Multiple Vitamins-Minerals (PRESERVISION AREDS 2 PO) Take 1 tablet by mouth in the morning and at bedtime.    [provider]  nitroGLYCERIN (NITROSTAT) 0.4 MG SL tablet DISSOLVE 1 TABLET UNDER THE TONGUE EVERY 5 MINUTES AS NEEDED FOR CHEST PAIN, MAXIMUM 3 TABLETS Patient taking differently: Place 0.4 mg under the tongue every 5 (five) minutes as needed for chest pain. 04/30/19   [name], MD  Saline (AYR NASAL MIST ALLERGY/SINUS NA) Place  2 sprays into the nose as needed (dryness).    [provider]  trolamine salicylate (ASPERCREME) 10 % cream Apply 1 Application topically in the morning and at bedtime. For leg cramps    [provider]  Vitamin D, Ergocalciferol, (DRISDOL) 1.25 MG (50000 UNIT) CAPS capsule Take 50,000 Units by mouth every 30 (thirty) days. 07/13/21   [provider]      Allergies    Aspirin, Lisinopril, Amoxicillin, Atarax [hydroxyzine hcl], Cephalexin, Ciprofloxacin, Clindamycin, Clobetasol, Codeine, Fish allergy, Fluarix [influenza virus vaccine], Haemophilus  influenzae, Hydrocodone, Hydrocodone-acetaminophen, Hydroxyzine, Latex, Macrobid [nitrofurantoin macrocrystal], Niacin, Niacin-lovastatin er, Niacin-lovastatin er, Nitrofurantoin, Omeprazole, Other, Tramadol, Vibramycin [doxycycline], Adhesive [tape], Bactrim [sulfamethoxazole-trimethoprim], Colchicine, Gabapentin, and Nortriptyline    Review of Systems   Review of Systems  HENT:  Positive for nosebleeds.     Physical Exam Updated Vital Signs BP (!) 170/70 (BP Location: Right Arm)   Pulse 60   Temp 98.2 F (36.8 C) (Oral)   Resp 16   SpO2 100%  Physical Exam Vitals and nursing note reviewed.  Constitutional:      Appearance: He is well-developed.  HENT:     Head: Normocephalic and atraumatic.     Nose:     Left Nostril: Epistaxis present.     Mouth/Throat:     Mouth: Mucous membranes are dry.  Eyes:     Pupils: Pupils are equal, round, and reactive to light.  Cardiovascular:     Rate and Rhythm: Normal rate.  Pulmonary:     Effort: Pulmonary effort is normal. No respiratory distress.  Abdominal:     General: Abdomen is flat. There is no distension.  Musculoskeletal:        General: Normal range of motion.     Cervical back: Normal range of motion.  Skin:    General: Skin is warm and dry.  Neurological:     Mental Status: He is alert.     ED Results / Procedures / Treatments   Labs (all labs ordered are listed, but only abnormal results are displayed) Labs Reviewed  CBC WITH DIFFERENTIAL/PLATELET - Abnormal; Notable for the following components:      Result Value   RBC 3.00 (*)    Hemoglobin 8.3 (*)    HCT 24.4 (*)    RDW 22.4 (*)    All other components within normal limits  COMPREHENSIVE METABOLIC PANEL - Abnormal; Notable for the following components:   Potassium 5.2 (*)    CO2 19 (*)    BUN 44 (*)    Creatinine, Ser 2.34 (*)    Calcium 8.8 (*)    Total Protein 6.1 (*)    AST 58 (*)    GFR, Estimated 25 (*)    All other components within normal limits   PROTIME-INR  TYPE AND SCREEN    EKG None  Radiology No results found.  Procedures .Epistaxis Management  Date/Time: 04/05/2022 5:36 AM  Performed by: Merrily Pew, MD Authorized by: Merrily Pew, MD   Consent:    Consent obtained:  Verbal   Consent given by:  Patient and spouse   Risks, benefits, and alternatives were discussed: yes     Risks discussed:  Infection, bleeding, nasal injury and pain   Alternatives discussed:  No treatment and delayed treatment Universal protocol:    Procedure explained and questions answered to patient or proxy's satisfaction: yes     Relevant documents present and verified: yes     Patient identity confirmed:  Arm band  Anesthesia:    Anesthesia method:  None Procedure details:    Treatment site:  L anterior   Treatment method:  Nasal balloon   Treatment complexity:  Limited   Treatment episode: initial   Post-procedure details:    Assessment:  Bleeding stopped     Medications Ordered in ED Medications  hydrALAZINE (APRESOLINE) injection 5 mg (5 mg Intravenous Given 04/05/22 0146)  oxymetazoline (AFRIN) 0.05 % nasal spray 3 spray (3 sprays Left Nare Given 04/05/22 0146)  amLODipine (NORVASC) tablet 5 mg (5 mg Oral Given 04/05/22 0422)    ED Course/ Medical Decision Making/ A&P                             Medical Decision Making Amount and/or Complexity of Data Reviewed Labs: ordered.  Risk OTC drugs. Prescription drug management.   Attemtped pressure and subsequently BP control and afrin without improvement. Rapid rhino placed, will fu w/ ENT.   Seems norvasc was stopped at last hospitalization. D/w Cardiology on phone, Dr. Vickki Muff, who reviewed chart and seems that it was stopped b/c of peripheral edema. Recommended starting back at a lower dose and fu w/ cardiology for further management.   Family ok with this plan. Epistaxis resolved with rapid rhino. Will d/c w/ ENT/cards follow up.  Final Clinical Impression(s) / ED  Diagnoses Final diagnoses:  Epistaxis  Hypertension, unspecified type    Rx / DC Orders ED Discharge Orders          Ordered    amLODipine (NORVASC) 5 MG tablet  Daily        04/05/22 0424              Azeneth Carbonell, [name], MD 04/05/22 [id number]    Merrily Pew, MD 04/05/22 [phone]

## 2022-04-05 NOTE — ED Triage Notes (Signed)
Pt seen by ENT yesterday and cauterized. Started bleeding at 11pm on right nostril. Pt is hypertensive. EMS been holding pressure. Pt is hypertensive.

## 2022-04-08 LAB — TYPE AND SCREEN
ABO/RH(D): O POS
Antibody Screen: POSITIVE
DAT, IgG: NEGATIVE
Unit division: 0
Unit division: 0
Unit division: 0
Unit division: 0
Unit division: 0

## 2022-04-08 LAB — BPAM RBC
Blood Product Expiration Date: 202404232359
Blood Product Expiration Date: 202404232359
Blood Product Expiration Date: 202404242359
Blood Product Expiration Date: 202404242359
Blood Product Expiration Date: 202404252359
ISSUE DATE / TIME: 202403281107
ISSUE DATE / TIME: 202403281117
Unit Type and Rh: 5100
Unit Type and Rh: 5100
Unit Type and Rh: 5100
Unit Type and Rh: 5100
Unit Type and Rh: 5100

## 2022-04-10 ENCOUNTER — Ambulatory Visit (INDEPENDENT_AMBULATORY_CARE_PROVIDER_SITE_OTHER): Payer: Medicare Other

## 2022-04-10 ENCOUNTER — Encounter: Payer: Self-pay | Admitting: Nurse Practitioner

## 2022-04-10 ENCOUNTER — Telehealth: Payer: Self-pay | Admitting: Internal Medicine

## 2022-04-10 ENCOUNTER — Ambulatory Visit: Payer: Medicare Other | Attending: Nurse Practitioner | Admitting: Nurse Practitioner

## 2022-04-10 VITALS — BP 142/68 | HR 71 | Ht 70.0 in | Wt 153.6 lb

## 2022-04-10 DIAGNOSIS — R040 Epistaxis: Secondary | ICD-10-CM

## 2022-04-10 DIAGNOSIS — I350 Nonrheumatic aortic (valve) stenosis: Secondary | ICD-10-CM | POA: Diagnosis not present

## 2022-04-10 DIAGNOSIS — I5032 Chronic diastolic (congestive) heart failure: Secondary | ICD-10-CM

## 2022-04-10 DIAGNOSIS — N1832 Chronic kidney disease, stage 3b: Secondary | ICD-10-CM

## 2022-04-10 DIAGNOSIS — I442 Atrioventricular block, complete: Secondary | ICD-10-CM

## 2022-04-10 DIAGNOSIS — I10 Essential (primary) hypertension: Secondary | ICD-10-CM

## 2022-04-10 DIAGNOSIS — E785 Hyperlipidemia, unspecified: Secondary | ICD-10-CM | POA: Diagnosis present

## 2022-04-10 DIAGNOSIS — I2510 Atherosclerotic heart disease of native coronary artery without angina pectoris: Secondary | ICD-10-CM | POA: Diagnosis not present

## 2022-04-10 MED ORDER — HYDRALAZINE HCL 50 MG PO TABS: 75.0000 mg | ORAL_TABLET | Freq: Three times a day (TID) | ORAL | 2 refills | Status: DC

## 2022-04-10 NOTE — Progress Notes (Signed)
Office Visit    Patient Name: [name] Date of Encounter: 04/10/2022  Primary Care Provider:  [name], MD Primary Cardiologist:  [name], MD  Chief Complaint    89 year old male with a history of CAD s/p prior stenting to mid RCA and proximal PDA in 2001, s/p CABG x 3 (LIMA to LAD, SVG to PDA, and SVG to OM1) in 2013 with early graft failure his venous conduits and continued patency of the LIMA-LAD, chronic diastolic heart failure, symptomatic bradycardia s/p PPM, severe aortic stenosis s/p TAVR in 01/2022, hypertension, hyperlipidemia, anemia, and prostate cancer who presents for spittle follow-up related to hypertension and epistaxis.   Past Medical History    Past Medical History:  Diagnosis Date   Anemia    AV block, 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 S/P stenting to mid RCA, prox PDA 06/1999. b. NSTEMI/CABG x 3 in 10/2011 with LIMA to LAD, SVG to PDA, and SVG to OM1.    Cephalalgia 07/14/2015   CHB (complete heart block) 05/03/2012   Overview:  Status post complete heart block heart rate 28 bpm, alternating with 2 to one AV block and drug for bradycardia.  Status post pacemaker implantation.status post Medtronic pacemaker implantation the 03/28/2012   Chronic diastolic CHF (congestive heart failure) 02/05/2018   Chronic gouty arthritis    Chronic UTI    a. Followed by Dr. Risa Grill - colonized/asymptomatic - not on abx   CKD (chronic kidney disease)    stage 3, GFR 30-59 ml/min; stable with a creatinine around 1.9-2.0 followed by nephrology.   Constipation 03/01/2015   Symptoms and exam consistent with constipation. Abdominal exam is benign with no evidence of pain or obstruction. Discussed importance of increasing fiber and water intake coupled with physical activity to assist with bowel movements. Continue over-the-counter medication management as needed. Follow-up if  symptoms worsen or fail to improve.   Coronary atherosclerosis 11/27/2011   Overview:  Multivessel coronary artery disease recently diagnosed by catheterization 2013 History of stent placement with a heparin-coated stent 2001  Last Assessment & Plan:  Status post coronary bypass grafting.  No recurrent chest pain. Overview:  Overview:  S/P stenting to mid RCA, prox PDA 06/1999;  06/2007 Myoview: negative except for apical thinning, EF 68%, last Myoview August 10, 2008 with n   Degeneration of lumbar intervertebral disc    Degenerative disc disease, cervical, with radiculopathy [id number]   Diastolic dysfunction [id number]   Grade 1 DD on Echo 06/2017   Dyslipidemia    Dysphagia 02/06/2016   3/18 - DG Esophagus:  1. Mild esophageal dysmotility, likely presbyesophagus. 2. No other explanation for patient's symptoms. 3. Small hiatal hernia.  On swallow eval - evidence of cervical spine disease and that was likely contributing   Epistaxis 12/08/2020   Essential hypertension 07/20/2006   GERD (gastroesophageal reflux disease)    Gout 05/12/2018   Gouty arthritis of right great toe 09/03/2017   Left toe Injected January 31, 2018    Hyperpigmentation 11/04/2017   Internal hemorrhoids 08/15/2016   Left inguinal hernia 07/01/2019   Lumbar post-laminectomy syndrome 12/12/2017   Moderate aortic regurgitation 07/04/2017   Echo 06/2017:  EF 55-60%, mild LVH, grade 1 DD, mild AS, mod AR, mild MR, mild-mod TR   Neck injury    a. 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  This was switched to by mouth  amiodarone at the time of the thoracic surgery appointment was discontinued.   Presence of aortocoronary bypass graft 10/24/2011   Overview:  Performed at [facility] 2013 Last Assessment & Plan:  No complication post coronary bypass grafting.   Prostate cancer    a. 2001 s/p TURP.   Pulmonary nodule    a. felt to be noncancerous.  Status post followup CT scan 4 mm and stable.   Renal artery stenosis    a. 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 Burt Knack & Dr. 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   Trochanteric bursitis of hip, bilateral 01/10/2017   UGIB (upper gastrointestinal bleed) 02/01/2018   EGD  02/06/18 - mod, non-erosive gastritis   Venous insufficiency of leg 06/05/2010   Vitamin B12 deficiency 08/23/2009   Jan '14  July '14 B12 level  >1500    472   Weakness of both lower extremities 04/09/2020   Past Surgical History:  Procedure Laterality Date   ANTERIOR CHAMBER WASHOUT Left 10/04/2018   Procedure: Anterior Chamber Washout, Vitreous Tap;  Surgeon: Jalene Mullet, MD;  Location: [place];  Service: Ophthalmology;  Laterality: Left;   cardia catherization  07-07-99   CARDIAC SURGERY  10/18/12   open heart surgery   CATARACT EXTRACTION W/ INTRAOCULAR LENS  IMPLANT, BILATERAL  3/205, 06/2013   [facility]   COLONOSCOPY  04/12/07   CORONARY ARTERY BYPASS GRAFT  10/19/2011   Procedure: CORONARY ARTERY BYPASS GRAFTING (CABG);  Surgeon: Gaye Pollack, MD;  Location: [place];  Service: Open Heart Surgery;  Laterality: N/A;  times three using Left Internal Mammary Artery and Right Greater Saphenouse Vein Graft harvested Endoscopically   edg  07-17-1994   FLEXIBLE SIGMOIDOSCOPY  11-03-1997   GAS INSERTION Left 10/04/2018    Procedure: Insertion Of Gas;  Surgeon: Jalene Mullet, MD;  Location: [place];  Service: Ophthalmology;  Laterality: Left;   GAS/FLUID EXCHANGE Left 10/04/2018   Procedure: Gas/Fluid Exchange;  Surgeon: Jalene Mullet, MD;  Location: [place];  Service: Ophthalmology;  Laterality: Left;   INTRAOPERATIVE TRANSTHORACIC ECHOCARDIOGRAM N/A 02/06/2022   Procedure: INTRAOPERATIVE TRANSTHORACIC ECHOCARDIOGRAM;  Surgeon: [name], MD;  Location: [place] [facility];  Service: Open Heart Surgery;  Laterality: N/A;   LEFT HEART CATHETERIZATION WITH CORONARY ANGIOGRAM N/A 10/16/2011   Procedure: LEFT HEART CATHETERIZATION WITH CORONARY ANGIOGRAM;  Surgeon: Peter M Martinique, MD;  Location: [facility] CATH LAB;  Service: Cardiovascular;  Laterality: N/A;   LEFT HEART CATHETERIZATION WITH CORONARY/GRAFT ANGIOGRAM N/A 03/11/2013   Procedure: LEFT HEART CATHETERIZATION WITH [name];  Surgeon: Blane Ohara, MD;  Location: [facility] CATH LAB;  Service: Cardiovascular;  Laterality: N/A;   lumbar spinal disk and neck fusion surgery     PACEMAKER INSERTION  03/28/12   PPM implanted for mobitz II AV block   PARS PLANA VITRECTOMY Left 10/04/2018  Procedure: PARS PLANA VITRECTOMY 25 GAUGE FOR ENDOPHTHALMITIS WITH INJECTION OF INTRAVITREAL ANTIBIOTIC;  Surgeon: Jalene Mullet, MD;  Location: [place];  Service: Ophthalmology;  Laterality: Left;   peripheral vascular catherization  11-24-03   PERMANENT PACEMAKER INSERTION N/A 03/28/2012   Procedure: PERMANENT PACEMAKER INSERTION;  Surgeon: Thompson Grayer, MD;  Location: [facility] CATH LAB;  Service: Cardiovascular;  Laterality: N/A;   PROSTATECTOMY     renal circulation  10-01-03   RIGHT HEART CATH N/A 03/02/2022   Procedure: RIGHT HEART CATH;  Surgeon: Lorretta Harp, MD;  Location: [place] CV [facility];  Service: Cardiovascular;  Laterality: N/A;   s/p ptca     stents     X 2   stress cardiolite  05-04-05   spring 09-negative except for apical thinning, EF 68%   TRANSCATHETER  AORTIC VALVE REPLACEMENT, TRANSFEMORAL Right 02/06/2022   Procedure: Transcatheter Aortic Valve Replacement, Transfemoral;  Surgeon: [name], MD;  Location: [place] [facility];  Service: Open Heart Surgery;  Laterality: Right;    Allergies  Allergies  Allergen Reactions   Aspirin Nausea And Vomiting and Other (See Comments)    Upset stomach- tolerates coated aspirin    Lisinopril Anaphylaxis and Shortness Of Breath    After 3 tablets, he experienced trouble swallowing, throat tightness and hoarseness.    Amoxicillin Nausea Only   Atarax [Hydroxyzine Hcl] Nausea And Vomiting   Cephalexin Diarrhea   Ciprofloxacin Nausea Only   Clindamycin Other (See Comments)    Gi upset    Clobetasol Other (See Comments)    Not effective   Codeine Nausea Only and Other (See Comments)    Stomach upset   Fish Allergy Nausea And Vomiting   Fluarix [Influenza Virus Vaccine] Itching   Haemophilus Influenzae Itching   Hydrocodone Nausea Only   Hydrocodone-Acetaminophen Nausea And Vomiting   Hydroxyzine Nausea And Vomiting   Latex Itching and Other (See Comments)    (After flu shot)   Macrobid [Nitrofurantoin Macrocrystal] Nausea Only   Niacin Other (See Comments) and Nausea And Vomiting    Unknown reaction  Reaction not recalled    Unsure of reaction. Taking simvastatin at home without problems   Niacin-Lovastatin Er Other (See Comments)    Unsure of reaction. Taking simvastatin at home without problems   Niacin-Lovastatin Er Other (See Comments)    Reaction not recalled   Nitrofurantoin Other (See Comments)    Upset stomach    Omeprazole Other (See Comments)    Reaction not recalled- stopped by MD   Other Nausea And Vomiting   Tramadol Other (See Comments)    Unknown    Vibramycin [Doxycycline] Other (See Comments)    Reaction not recalled   Adhesive [Tape] Rash   Bactrim [Sulfamethoxazole-Trimethoprim] Rash   Colchicine Rash   Gabapentin Other (See Comments)    Painful  pimples on tongue   Nortriptyline Rash     Labs/Other Studies Reviewed    The following studies were reviewed today: Echo 03/14/22:   1. Left ventricular ejection fraction, by estimation, is 60 to 65%. The  left ventricle has normal function. The left ventricle has no regional  wall motion abnormalities. There is mild left ventricular hypertrophy.  Left ventricular diastolic parameters  are indeterminate.   2. Right ventricular systolic function is low normal. The right  ventricular size is normal. There is moderately elevated pulmonary artery  systolic pressure.   3. Mild to moderate mitral valve regurgitation. Moderate mitral annular  calcification.   4. Tricuspid valve  regurgitation is severe.   5. S/p TAVR (26 mm Edwards S3UR, procedure date 02/06/22) Peak and mean  gradients through the valve are 15 and 8 mm Hg respectively.. The aortic  valve has been repaired/replaced. Aortic valve regurgitation is not  visualized.   6. Aortic dilatation noted. There is mild dilatation of the aortic root,  measuring 39 mm. There is mild dilatation of the ascending aorta,  measuring 40 mm.   7. The inferior vena cava is dilated in size with >50% respiratory  variability, suggesting right atrial pressure of 8 mmHg.    [place] 03/02/22:   HEMODYNAMICS:    1: Right atrial pressure-11/10 2: Right ventricular pressure-21/0 3: Pulmonary artery pressure-44/13, mean 25 4: Pulmonary artery wedge pressure-A-wave 15, V wave 13, mean 10 Cardiac output-8.1 L/min with an index of 4.3 L/min/m.     IMPRESSION:Mr Heron right heart cath suggested normal filling pressures.  He has mildly elevated pulmonary artery pressures.     TAVR OPERATIVE NOTE     Date of Procedure:                02/06/2022   Preoperative Diagnosis:      Severe Aortic Stenosis    Postoperative Diagnosis:    Same    Procedure:        Transcatheter Aortic Valve Replacement - Percutaneous  Transfemoral Approach              Edwards Sapien 3 Ultra Resilia THV (size 26 mm, serial # [id number])              Co-Surgeons:                        Coralie Common, MD and [name], MD   Anesthesiologist:                  Jana Half, DO   Echocardiographer:              Marry Guan, MD   Pre-operative Echo Findings: Severe aortic stenosis Normal left ventricular systolic function   Post-operative Echo Findings: No paravalvular leak Normal/unchanged left ventricular systolic function   _____________     Echo 02/07/22:    1. The aortic valve has been repaired/replaced. Aortic valve  regurgitation is not visualized. There is a 26 mm Sapien prosthetic (TAVR)  valve present in the aortic position. Procedure Date: 02/06/2022. Echo  findings are consistent with normal structure  and function of the aortic valve prosthesis. Aortic valve area, by VTI  measures 2.58 cm. Aortic valve mean gradient measures 7.3 mmHg. Aortic  valve Vmax measures 1.84 m/s.   2. Left ventricular ejection fraction, by estimation, is 55 to 60%. The  left ventricle has normal function. The left ventricle has no regional  wall motion abnormalities. There is mild concentric left ventricular  hypertrophy. Left ventricular diastolic  parameters are consistent with Grade II diastolic dysfunction  (pseudonormalization).   3. Right ventricular systolic function is normal. The right ventricular  size is normal. There is mildly elevated pulmonary artery systolic  pressure. The estimated right ventricular systolic pressure is [id number] mmHg.   4. Left atrial size was severely dilated.   5. The mitral valve is grossly normal. Mild to moderate mitral valve  regurgitation. No evidence of mitral stenosis.   6. The inferior vena cava is dilated in size with >50% respiratory  variability, suggesting right atrial pressure of 8 mmHg.   Recent Labs: 02/27/2022: B Natriuretic  Peptide 490.4 02/28/2022: Magnesium 2.4 04/03/2022: TSH 1.940 04/05/2022: ALT 14;  BUN 44; Creatinine, Ser 2.34; Hemoglobin 8.3; Platelets 321; Potassium 5.2; Sodium 135  Recent Lipid Panel    Component Value Date/Time   CHOL 122 09/04/2016 1123   TRIG 59.0 09/04/2016 1123   TRIG 107 01/16/2006 1338   HDL 40.40 09/04/2016 1123   CHOLHDL 3 09/04/2016 1123   VLDL 11.8 09/04/2016 1123   LDLCALC 70 09/04/2016 1123    History of Present Illness   89 year old male with the above past medical history including CAD s/p prior stenting to mid RCA and proximal PDA in 2001, s/p CABG x 3 (LIMA to LAD, SVG to PDA, and SVG to OM1) in 2013 with early graft failure his venous conduits and continued patency of the LIMA-LAD, chronic diastolic heart failure, symptomatic bradycardia s/p PPM, severe aortic stenosis s/p TAVR in 01/2022, hypertension, hyperlipidemia, anemia, and prostate cancer.  Nuclear stress test in 2019 demonstrated no significant ischemia.  Echocardiogram in 12/2021 in the setting of dyspnea on exertion and fatigue revealed moderate LVH, severe aortic stenosis with mean gradient 20 mmHg, moderate MAC, moderate MR and moderate TR.  He was felt to have severe symptomatic aortic stenosis and underwent TAVR with a 26 mm Edwards SAPIEN 3 ultra cilia HD via TF approach in January 2024.  He was evaluated in the ED in February 2024 with uncontrollable "body jerking" with leg tightening and a brief episode of leg and foot numbness.  ED evaluation was unrevealing-apparently patient had prior similar symptoms. He returned to the ED on 02/21/2022 with acute febrile illness complicated by AKI. He presented again 4 days later with hallucinations. Ultrasound of the right upper quadrant showed thickened gallbladder, no acute cholelithiasis.  He was noted to have AKI.  GI and nephrology were consulted.  His AKI was felt to be multifactorial in the setting of bladder outlet obstruction, decreased CAD in the context of vascular congestion.  He underwent RHC on 03/02/2022 which revealed normal filling  pressures, mildly elevated pulmonary artery pressure. He was last seen in the office on 03/14/2022 and was stable from a cardiac standpoint. Unfortunately, he presented to the ED on 04/01/2022 in the setting of epistaxis. BP was also elevated, treated with IV hydralazine and labatelol.  His wife reported increased confusion. CT of the head was negative for acute process. Hydralazine was increased to 75 mg 3 times daily.  He was advised to follow-up with ENT and cardiology as an outpatient.  He presents today for follow-up accompanied by his wife. Since his last visit and since his recent ED visits he has been stable from a cardiac standpoint.  He saw ENT yesterday and had his nose cauterized.  However, he continues to have nosebleeds.  BP remains mildly elevated.  He has only been taking amlodipine for a few days.  He reports stable mild dyspnea, intermittent nonpitting bilateral lower extremity edema.  Other than his concern for his intermittently elevated BP, ongoing epistaxis, he reports feeling well.  Home Medications    Current Outpatient Medications  Medication Sig Dispense Refill   acetaminophen (TYLENOL) 500 MG tablet Take 1,000 mg by mouth 3 (three) times daily as needed for moderate pain.     allopurinol (ZYLOPRIM) 100 MG tablet TAKE 1 TABLET(100 MG) BY MOUTH DAILY (Patient taking differently: Take 100 mg by mouth daily. TAKE 1 TABLET(100 MG) BY MOUTH DAILY) 30 tablet 0   amLODipine (NORVASC) 5 MG tablet Take 1 tablet (5 mg total) by mouth  daily. 30 tablet 0   Ascorbic Acid (VITAMIN C) 1000 MG tablet Take 1,000 mg by mouth daily.     aspirin EC 81 MG tablet Take 81 mg by mouth daily.     Calcium Citrate-Vitamin D (CITRACAL + D PO) Take 2 tablets by mouth in the morning and at bedtime.     Carboxymeth-Glycerin-Polysorb (REFRESH OPTIVE MEGA-3 OP) Place 1 drop into both eyes 2 (two) times daily.     Cholecalciferol 25 MCG (1000 UT) capsule Take 1,000 Units by mouth daily.     cyanocobalamin  (,VITAMIN B-12,) 1000 MCG/ML injection Inject 1,000 mcg into the muscle once. Monthly injection     folic acid (FOLVITE) 1 MG tablet Take 1 tablet (1 mg total) by mouth daily. Annual appt due in May must see provider for future refills (Patient taking differently: Take 1 mg by mouth at bedtime. Annual appt due in May must see provider for future refills) 90 tablet 2   Multiple Vitamin (MULTIVITAMIN) tablet Take 1 tablet by mouth daily.     Multiple Vitamins-Minerals (PRESERVISION AREDS 2 PO) Take 1 tablet by mouth in the morning and at bedtime.     Vitamin D, Ergocalciferol, (DRISDOL) 1.25 MG (50000 UNIT) CAPS capsule Take 50,000 Units by mouth every 30 (thirty) days.     amoxicillin (AMOXIL) 500 MG capsule Take 2,000 mg by mouth as directed. 1 HOUR PRIOR TO DENTAL CLEANINGS AND PROCEDURES (Patient not taking: Reported on 04/10/2022)     Aromatic Inhalants (VICKS VAPOR INHALER IN) Place 1 puff into both nostrils as needed (for congestion). (Patient not taking: Reported on 04/10/2022)     furosemide (LASIX) 40 MG tablet Take 1 tablet (40 mg total) by mouth as needed. (Patient not taking: Reported on 04/10/2022) 90 tablet 3   hydrALAZINE (APRESOLINE) 50 MG tablet Take 1.5 tablets (75 mg total) by mouth 3 (three) times daily. 378 tablet 2   loratadine (CLARITIN) 10 MG tablet Take 10 mg by mouth daily as needed (for seasonal allergies). (Patient not taking: Reported on 04/10/2022)     nitroGLYCERIN (NITROSTAT) 0.4 MG SL tablet DISSOLVE 1 TABLET UNDER THE TONGUE EVERY 5 MINUTES AS NEEDED FOR CHEST PAIN, MAXIMUM 3 TABLETS (Patient not taking: Reported on 04/10/2022) 100 tablet 1   Saline (AYR NASAL MIST ALLERGY/SINUS NA) Place 2 sprays into the nose as needed (dryness). (Patient not taking: Reported on 04/10/2022)     trolamine salicylate (ASPERCREME) 10 % cream Apply 1 Application topically in the morning and at bedtime. For leg cramps (Patient not taking: Reported on 04/10/2022)     Current Facility-Administered  Medications  Medication Dose Route Frequency Provider Last Rate Last Admin   NON FORMULARY 1 application  1 application  Topical PRN Landis Martins, DPM         Review of Systems    He denies chest pain, palpitations, pnd, orthopnea, n, v, dizziness, syncope, weight gain, or early satiety. All other systems reviewed and are otherwise negative except as noted above.     Cardiac Rehabilitation Eligibility Assessment      Physical Exam    VS:  BP (!) 142/68   Pulse 71   Ht 5\' 10"  (1.778 m)   Wt 153 lb 9.6 oz (69.7 kg)   SpO2 93%   BMI 22.04 kg/m  GEN: Well nourished, well developed, in no acute distress. HEENT: normal. Neck: Supple, no JVD, carotid bruits, or masses. Cardiac: RRR, no murmurs, rubs, or gallops. No clubbing, cyanosis, edema.  Radials/DP/PT 2+  and equal bilaterally.  Respiratory:  Respirations regular and unlabored, clear to auscultation bilaterally. GI: Soft, nontender, nondistended, BS + x 4. MS: no deformity or atrophy. Skin: warm and dry, no rash. Neuro:  Strength and sensation are intact. Psych: Normal affect.  Accessory Clinical Findings    ECG personally reviewed by me today -sinus rhythm, 71 bpm, PACs, bifascicular block- no acute changes.   Lab Results  Component Value Date   WBC 6.5 04/05/2022   HGB 8.3 (L) 04/05/2022   HCT 24.4 (L) 04/05/2022   MCV 81.3 04/05/2022   PLT 321 04/05/2022   Lab Results  Component Value Date   CREATININE 2.34 (H) 04/05/2022   BUN 44 (H) 04/05/2022   NA 135 04/05/2022   K 5.2 (H) 04/05/2022   CL 107 04/05/2022   CO2 19 (L) 04/05/2022   Lab Results  Component Value Date   ALT 14 04/05/2022   AST 58 (H) 04/05/2022   ALKPHOS 71 04/05/2022   BILITOT 0.9 04/05/2022   Lab Results  Component Value Date   CHOL 122 09/04/2016   HDL 40.40 09/04/2016   LDLCALC 70 09/04/2016   TRIG 59.0 09/04/2016   CHOLHDL 3 09/04/2016    No results found for: "HGBA1C"  Assessment & Plan    1. CAD:  S/p prior stenting to  mid RCA and proximal PDA in 2001, s/p CABG x 3 (LIMA to LAD, SVG to PDA, and SVG to OM1) in 2013 with early graft failure his venous conduits and continued patency of the LIMA-LAD. Nuclear stress test in 2019 demonstrated no significant ischemia.  Stable with no anginal symptoms. No indication for ischemic evaluation.  Continue aspirin, amlodipine, hydralazine.   2. Chronic diastolic heart failure: Most recent echo in 03/2022 showed EF 60 to 65%, normal LV function, no RWMA, normal RV systolic function, moderately elevated PASP, mild to moderate mitral valve regurgitation, moderate MAC, stable functioning TAVR. Euvolemic and well compensated on exam.  Continue prn Lasix.  3. Severe AS: S/p TAVR. Most recent echo as above. Due for repeat echo in 02/2023 per structural heart team.   4. Hypertension: He was recently restarted on amlodipine.  BP remains intermittently elevated.  He has only been taking amlodipine for 3 days. I advised him to continue to monitor BP and report BP consistent greater than 140/80.  If BP remains elevated above goal, consider increasing amlodipine to 10 mg daily.  Otherwise, continue current antihypertensive regimen.  5. S/p PPM: Most recent device interrogation in 01/2022 showed normal device function. Follows with EP.  6. CKD stage IIIb: Creatinine was stable at 2.34 on 04/05/2022.  He notes he will have a repeat BMET with his PCP later today.   7. Epistaxis: He continues to have nosebleeds despite recent cauterization.  Recommend follow-up with ENT.  8. Disposition: Follow-up in 1 month with Dr. Burt Knack.  HYPERTENSION CONTROL Vitals:   04/10/22 0905 04/10/22 1000  BP: (!) 142/64 (!) 142/68    The patient's blood pressure is elevated above target today.  In order to address the patient's elevated BP: Blood pressure will be monitored at home to determine if medication changes need to be made.; Follow up with general cardiology has been recommended.      Lenna Sciara,  NP 04/10/2022, 1:09 PM

## 2022-04-10 NOTE — Telephone Encounter (Signed)
Message left today 

## 2022-04-10 NOTE — Telephone Encounter (Signed)
Okay for orders? 

## 2022-04-10 NOTE — Telephone Encounter (Signed)
Shanon Brow from [facility] called to get an extension on home PT orders. The pt has been at the ED and had to restrict some of their exercises and progress was slowed.   Requesting to extend orders for: 1X for 3 weeks   Please call Shanon Brow to confirm:  [phone]

## 2022-04-10 NOTE — Patient Instructions (Signed)
Medication Instructions:  Your physician recommends that you continue on your current medications as directed. Please refer to the Current Medication list given to you today.   *If you need a refill on your cardiac medications before your next appointment, please call your pharmacy*   Lab Work: None ordered   Testing/Procedures: None ordered   Follow-Up: At [facility], you and your health needs are our priority.  As part of our continuing mission to provide you with exceptional heart care, we have created designated Provider Care Teams.  These Care Teams include your primary Cardiologist (physician) and Advanced Practice Providers (APPs -  Physician Assistants and Nurse Practitioners) who all work together to provide you with the care you need, when you need it.  We recommend signing up for the patient portal called "MyChart".  Sign up information is provided on this After Visit Summary.  MyChart is used to connect with patients for Virtual Visits (Telemedicine).  Patients are able to view lab/test results, encounter notes, upcoming appointments, etc.  Non-urgent messages can be sent to your provider as well.   To learn more about what you can do with MyChart, go to [email or web address].    Your next appointment:   1 month(s)  Provider:   [name], MD     Other Instructions Please take your blood pressure daily for 2 weeks and Report any Blood Pressure readings consistently above 140/80.  HOW TO TAKE YOUR BLOOD PRESSURE: Rest 5 minutes before taking your blood pressure. Don't smoke or drink caffeinated beverages for at least 30 minutes before. Take your blood pressure before (not after) you eat. Sit comfortably with your back supported and both feet on the floor (don't cross your legs). Elevate your arm to heart level on a table or a desk. Use the proper sized cuff. It should fit smoothly and snugly around your bare upper arm. There should be enough room to slip  a fingertip under the cuff. The bottom edge of the cuff should be 1 inch above the crease of the elbow. Ideally, take 3 measurements at one sitting and record the average.

## 2022-04-11 ENCOUNTER — Ambulatory Visit (INDEPENDENT_AMBULATORY_CARE_PROVIDER_SITE_OTHER): Payer: Medicare Other | Admitting: *Deleted

## 2022-04-11 DIAGNOSIS — E538 Deficiency of other specified B group vitamins: Secondary | ICD-10-CM | POA: Diagnosis not present

## 2022-04-11 LAB — CUP PACEART REMOTE DEVICE CHECK
Battery Impedance: 1450 Ohm
Battery Remaining Longevity: 49 mo
Battery Voltage: 2.76 V
Brady Statistic AP VP Percent: 1 %
Brady Statistic AP VS Percent: 53 %
Brady Statistic AS VP Percent: 0 %
Brady Statistic AS VS Percent: 46 %
Date Time Interrogation Session: 20240402125612
Implantable Lead Connection Status: 753985
Implantable Lead Connection Status: 753985
Implantable Lead Implant Date: 20140321
Implantable Lead Implant Date: 20140321
Implantable Lead Location: 753859
Implantable Lead Location: 753860
Implantable Lead Model: 5076
Implantable Lead Model: 5092
Implantable Pulse Generator Implant Date: 20140321
Lead Channel Impedance Value: 381 Ohm
Lead Channel Impedance Value: 700 Ohm
Lead Channel Pacing Threshold Amplitude: 0.625 V
Lead Channel Pacing Threshold Amplitude: 0.75 V
Lead Channel Pacing Threshold Pulse Width: 0.4 ms
Lead Channel Pacing Threshold Pulse Width: 0.4 ms
Lead Channel Setting Pacing Amplitude: 2 V
Lead Channel Setting Pacing Amplitude: 2.5 V
Lead Channel Setting Pacing Pulse Width: 0.4 ms
Lead Channel Setting Sensing Sensitivity: 5.6 mV
Zone Setting Status: 755011
Zone Setting Status: 755011

## 2022-04-11 MED ORDER — CYANOCOBALAMIN 1000 MCG/ML IJ SOLN
1000.0000 ug | Freq: Once | INTRAMUSCULAR | Status: AC
  Administered 2022-04-11: 1000 ug via INTRAMUSCULAR

## 2022-04-11 NOTE — Progress Notes (Signed)
Pls cosign for B12 inj../lmb  

## 2022-04-12 ENCOUNTER — Telehealth: Payer: Self-pay | Admitting: *Deleted

## 2022-04-12 NOTE — Telephone Encounter (Signed)
     Patient  visit on 04/05/2022  at [facility] ed  was for treatment   Have you been able to follow up with your primary care physician? Did see the PA will try to get in to see cardilogist sooner as they feel it is needed  The patient was able to obtain any needed medicine or equipment.  Are there diet recommendations that you are having difficulty following?  Patient expresses understanding of discharge instructions and education provided has no other needs at this time.    [place] [phone] [address] , [place] [address] Email : [email or web address]

## 2022-04-16 ENCOUNTER — Other Ambulatory Visit: Payer: Self-pay | Admitting: Internal Medicine

## 2022-04-16 MED ORDER — AMLODIPINE BESYLATE 5 MG PO TABS: 5.0000 mg | ORAL_TABLET | Freq: Every day | ORAL | 1 refills | Status: DC

## 2022-04-16 MED ORDER — HYDRALAZINE HCL 100 MG PO TABS: 100.0000 mg | ORAL_TABLET | Freq: Three times a day (TID) | ORAL | 5 refills | Status: DC

## 2022-04-17 ENCOUNTER — Telehealth: Payer: Self-pay | Admitting: Internal Medicine

## 2022-04-17 NOTE — Telephone Encounter (Signed)
Denise from [facility] is concerned about patient being on medication combination of furosemide (LASIX) 40 MG tablet,  amLODipine (NORVASC) 5 MG tablet, and hydrALAZINE (APRESOLINE) 100 MG tablet. D She said his bp reading 04/17/22 was 148/62 on his 75mg  dose of hydralazine. She would like a call back at [phone](secure) to discuss.

## 2022-04-18 NOTE — Telephone Encounter (Signed)
Can hold off on change if she can help monitor his BP at home.  When he was here last BP reportedly in 160s-170s.

## 2022-04-18 NOTE — Telephone Encounter (Signed)
Spoke with [employer] today.

## 2022-04-25 ENCOUNTER — Ambulatory Visit (HOSPITAL_COMMUNITY)
Admission: RE | Admit: 2022-04-25 | Discharge: 2022-04-25 | Disposition: A | Discharge status: Home / Self Care | Payer: Medicare Other | Source: Ambulatory Visit | Attending: Nephrology | Admitting: Nephrology

## 2022-04-25 ENCOUNTER — Encounter (HOSPITAL_COMMUNITY): Payer: Medicare Other

## 2022-04-25 VITALS — BP 165/64 | HR 69 | Temp 97.6°F | Resp 16 | Wt 148.0 lb

## 2022-04-25 DIAGNOSIS — N183 Chronic kidney disease, stage 3 unspecified: Secondary | ICD-10-CM | POA: Diagnosis present

## 2022-04-25 LAB — RENAL FUNCTION PANEL
Albumin: 4.1 g/dL (ref 3.5–5.0)
Anion gap: 9 (ref 5–15)
BUN: 45 mg/dL — ABNORMAL HIGH (ref 8–23)
CO2: 22 mmol/L (ref 22–32)
Calcium: 9.9 mg/dL (ref 8.9–10.3)
Chloride: 105 mmol/L (ref 98–111)
Creatinine, Ser: 2.18 mg/dL — ABNORMAL HIGH (ref 0.61–1.24)
GFR, Estimated: 28 mL/min — ABNORMAL LOW (ref >60)
Glucose, Bld: 87 mg/dL (ref 70–99)
Phosphorus: 3.1 mg/dL (ref 2.5–4.6)
Potassium: 4 mmol/L (ref 3.5–5.1)
Sodium: 136 mmol/L (ref 135–145)

## 2022-04-25 LAB — POCT HEMOGLOBIN-HEMACUE: Hemoglobin: 8.6 g/dL — ABNORMAL LOW (ref 13.0–17.0)

## 2022-04-25 MED ORDER — EPOETIN ALFA-EPBX 10000 UNIT/ML IJ SOLN: 20000.0000 [IU] | INTRAMUSCULAR | Status: DC

## 2022-04-25 MED ORDER — SODIUM CHLORIDE 0.9 % IV SOLN
510.0000 mg | INTRAVENOUS | Status: AC
  Administered 2022-04-25: 510 mg via INTRAVENOUS
  Filled 2022-04-25: qty 510

## 2022-04-25 MED ORDER — EPOETIN ALFA-EPBX 10000 UNIT/ML IJ SOLN
INTRAMUSCULAR | Status: AC
  Administered 2022-04-25: 20000 [IU] via SUBCUTANEOUS
  Filled 2022-04-25: qty 2

## 2022-05-04 ENCOUNTER — Ambulatory Visit: Payer: Medicare Other | Admitting: Internal Medicine

## 2022-05-07 ENCOUNTER — Ambulatory Visit: Payer: Medicare Other | Attending: Cardiovascular Disease | Admitting: Cardiovascular Disease

## 2022-05-07 ENCOUNTER — Encounter: Payer: Self-pay | Admitting: Cardiovascular Disease

## 2022-05-07 ENCOUNTER — Encounter (HOSPITAL_COMMUNITY): Payer: Self-pay

## 2022-05-07 VITALS — BP 150/80 | HR 78 | Ht 70.0 in | Wt 157.2 lb

## 2022-05-07 DIAGNOSIS — N1832 Chronic kidney disease, stage 3b: Secondary | ICD-10-CM | POA: Insufficient documentation

## 2022-05-07 DIAGNOSIS — I2510 Atherosclerotic heart disease of native coronary artery without angina pectoris: Secondary | ICD-10-CM | POA: Insufficient documentation

## 2022-05-07 DIAGNOSIS — I10 Essential (primary) hypertension: Secondary | ICD-10-CM | POA: Diagnosis present

## 2022-05-07 DIAGNOSIS — I5032 Chronic diastolic (congestive) heart failure: Secondary | ICD-10-CM

## 2022-05-07 DIAGNOSIS — Z952 Presence of prosthetic heart valve: Secondary | ICD-10-CM | POA: Diagnosis present

## 2022-05-07 MED ORDER — FUROSEMIDE 40 MG PO TABS: ORAL_TABLET | ORAL | 3 refills | Status: DC

## 2022-05-07 MED ORDER — CARVEDILOL 3.125 MG PO TABS: 3.1250 mg | ORAL_TABLET | Freq: Two times a day (BID) | ORAL | 3 refills | Status: DC

## 2022-05-07 NOTE — Progress Notes (Signed)
Cardiology Office Note:    Date:  05/07/2022   ID:  [name], DOB Sep 04, 1926, MRN [id number]  PCP:  Pincus Sanes, MD   [facility] Providers Cardiologist:  Tonny Bollman, MD     Referring MD: Pincus Sanes, MD   Chief Complaint  Patient presents with   Shortness of Breath    History of Present Illness:    [name] is a 89 y.o. male presenting for follow-up evaluation, here with his wife today.  The patient has a history of coronary artery disease with remote stenting in 2001 followed by multivessel CABG in 2013 with early failure of his saphenous vein graft conduits.  He has had continued patency of the LIMA to LAD graft.  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  He underwent TAVR in January 2024 for treatment of severe aortic stenosis.  He has had a somewhat difficult time since TAVR even though his initial procedure was uncomplicated.  He has had issues with uncontrollable "body jerking", now resolved.  He has had hallucinations which have now improved.  He was evaluated and had overnight observation for high fever with no recurrence.  They bring in home blood pressure readings which demonstrate elevated blood pressure on a consistent basis.  He complains of shortness of breath with physical activity.  He is short of breath when he first lays down but this resolves and he does not have PND.  He has had mild leg swelling but is not taking Lasix on a regular basis.  He takes Lasix as needed when his legs are swollen.  He had no chest pain or pressure.  Past Medical History:  Diagnosis Date   Anemia    AV block, 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 S/P stenting to mid RCA, prox PDA 06/1999. b.  NSTEMI/CABG x 3 in 10/2011 with LIMA to LAD, SVG to PDA, and SVG to OM1.    Cephalalgia 07/14/2015   CHB (complete heart block) 05/03/2012   Overview:  Status post complete heart block heart rate 28 bpm, alternating with 2 to one AV block and drug for bradycardia.  Status post pacemaker implantation.status post Medtronic pacemaker implantation the 03/28/2012   Chronic diastolic CHF (congestive heart failure) 02/05/2018   Chronic gouty arthritis    Chronic UTI    a. Followed by Dr. [name] Caprice - colonized/asymptomatic - not on abx   CKD (chronic kidney disease)    stage 3, GFR 30-59 ml/min; stable with a creatinine around 1.9-2.0 followed by nephrology.   Constipation 03/01/2015   Symptoms and exam consistent with constipation. Abdominal exam is benign with no evidence of pain or obstruction. Discussed importance of increasing fiber and water intake coupled with physical activity to assist with bowel movements. Continue over-the-counter medication management as needed. Follow-up if symptoms worsen or fail to improve.   Coronary atherosclerosis 11/27/2011   Overview:  Multivessel coronary artery disease recently diagnosed by catheterization 2013 History of stent placement with a heparin-coated stent 2001  Last Assessment & Plan:  Status post coronary bypass grafting.  No recurrent chest pain. Overview:  Overview:  S/P stenting to mid RCA, prox PDA 06/1999;  06/2007 Myoview: negative except for apical thinning, EF 68%, last Myoview August 10, 2008 with n   Degeneration  of lumbar intervertebral disc    Degenerative disc disease, cervical, with radiculopathy 09/21/2008   Diastolic dysfunction 07/04/2017   Grade 1 DD on Echo 06/2017   Dyslipidemia    Dysphagia 02/06/2016   3/18 - DG Esophagus:  1. Mild esophageal dysmotility, likely presbyesophagus. 2. No other explanation for patient's symptoms. 3. Small hiatal hernia.  On swallow eval - evidence of cervical spine disease and that was likely contributing    Epistaxis 12/08/2020   Essential hypertension 07/20/2006   GERD (gastroesophageal reflux disease)    Gout 05/12/2018   Gouty arthritis of right great toe 09/03/2017   Left toe Injected January 31, 2018    Hyperpigmentation 11/04/2017   Internal hemorrhoids 08/15/2016   Left inguinal hernia 07/01/2019   Lumbar post-laminectomy syndrome 12/12/2017   Moderate aortic regurgitation 07/04/2017   Echo 06/2017:  EF 55-60%, mild LVH, grade 1 DD, mild AS, mod AR, mild MR, mild-mod TR   Neck injury    a. 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  This was switched to by mouth amiodarone at the time of the thoracic surgery appointment was discontinued.   Presence of aortocoronary bypass graft 10/24/2011   Overview:  Performed at [facility] 2013 Last Assessment & Plan:  No complication post coronary bypass grafting.   Prostate cancer (HCC)    a. 2001 s/p TURP.   Pulmonary nodule    a. felt to be noncancerous.  Status post followup CT scan 4 mm and stable.   Renal artery stenosis    a. 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 Excell Seltzer & Dr. 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 OR;  Service: Ophthalmology;  Laterality: Left;   cardia catherization  07-07-99   CARDIAC SURGERY  10/18/12   open heart surgery   CATARACT EXTRACTION W/ INTRAOCULAR LENS  IMPLANT, BILATERAL  3/205, 06/2013   mccuen   COLONOSCOPY  04/12/07   CORONARY ARTERY BYPASS GRAFT  10/19/2011   Procedure: CORONARY ARTERY BYPASS GRAFTING (CABG);  Surgeon: Alleen Borne, MD;  Location[facility] OR;  Service: Open Heart Surgery;  Laterality: N/A;  times three using Left Internal Mammary Artery and Right Greater Saphenouse Vein Graft harvested Endoscopically   edg  07-17-1994   FLEXIBLE SIGMOIDOSCOPY  11-03-1997   GAS INSERTION Left  10/04/2018   Procedure: Insertion Of Gas;  Surgeon: Carmela Rima, MD;  Location: [facility] OR;  Service: Ophthalmology;  Laterality: Left;   GAS/FLUID EXCHANGE Left 10/04/2018   Procedure: Gas/Fluid Exchange;  Surgeon: Carmela Rima, MD;  Location: [facility] OR;  Service: Ophthalmology;  Laterality: Left;   INTRAOPERATIVE TRANSTHORACIC ECHOCARDIOGRAM N/A 02/06/2022   Procedure: INTRAOPERATIVE TRANSTHORACIC ECHOCARDIOGRAM;  Surgeon: Tonny Bollman, MD;  Location: [facility] INVASIVE CV LAB;  Service: Open Heart Surgery;  Laterality: N/A;   LEFT HEART CATHETERIZATION WITH CORONARY ANGIOGRAM N/A 10/16/2011   Procedure: LEFT HEART CATHETERIZATION WITH CORONARY ANGIOGRAM;  Surgeon: Peter M Swaziland, MD;  Location: [facility] CATH LAB;  Service: Cardiovascular;  Laterality: N/A;   LEFT HEART CATHETERIZATION WITH CORONARY/GRAFT ANGIOGRAM N/A 03/11/2013   Procedure: LEFT HEART CATHETERIZATION WITH [name] Caprice;  Surgeon: Micheline Chapman, MD;   Location: [facility] CATH LAB;  Service: Cardiovascular;  Laterality: N/A;   lumbar spinal disk and neck fusion surgery     PACEMAKER INSERTION  03/28/12   PPM implanted for mobitz II AV block   PARS PLANA VITRECTOMY Left 10/04/2018   Procedure: PARS PLANA VITRECTOMY 25 GAUGE FOR ENDOPHTHALMITIS WITH INJECTION OF INTRAVITREAL ANTIBIOTIC;  Surgeon: Carmela Rima, MD;  Location: [facility] OR;  Service: Ophthalmology;  Laterality: Left;   peripheral vascular catherization  11-24-03   PERMANENT PACEMAKER INSERTION N/A 03/28/2012   Procedure: PERMANENT PACEMAKER INSERTION;  Surgeon: Hillis Range, MD;  Location: [facility] CATH LAB;  Service: Cardiovascular;  Laterality: N/A;   PROSTATECTOMY     renal circulation  10-01-03   RIGHT HEART CATH N/A 03/02/2022   Procedure: RIGHT HEART CATH;  Surgeon: Runell Gess, MD;  Location: [facility] INVASIVE CV LAB;  Service: Cardiovascular;  Laterality: N/A;   s/p ptca     stents     X 2   stress cardiolite  05-04-05   spring 09-negative except for apical thinning, EF 68%   TRANSCATHETER AORTIC VALVE REPLACEMENT, TRANSFEMORAL Right 02/06/2022   Procedure: Transcatheter Aortic Valve Replacement, Transfemoral;  Surgeon: Tonny Bollman, MD;  Location: [facility] INVASIVE CV LAB;  Service: Open Heart Surgery;  Laterality: Right;    Current Medications: Current Meds  Medication Sig   acetaminophen (TYLENOL) 500 MG tablet Take 1,000 mg by mouth 3 (three) times daily as needed for moderate pain.   allopurinol (ZYLOPRIM) 100 MG tablet TAKE 1 TABLET(100 MG) BY MOUTH DAILY (Patient taking differently: Take 100 mg by mouth daily. TAKE 1 TABLET(100 MG) BY MOUTH DAILY)   amLODipine (NORVASC) 5 MG tablet Take 1 tablet (5 mg total) by mouth daily.   amoxicillin (AMOXIL) 500 MG capsule Take 2,000 mg by mouth as directed. 1 HOUR PRIOR TO DENTAL CLEANINGS AND PROCEDURES   Aromatic Inhalants (VICKS VAPOR INHALER IN) Place 1 puff into both nostrils as needed (for congestion).   Ascorbic Acid (VITAMIN C) 1000 MG  tablet Take 1,000 mg by mouth daily.   aspirin EC 81 MG tablet Take 81 mg by mouth daily.   Calcium Citrate-Vitamin D (CITRACAL + D PO) Take 2 tablets by mouth in the morning and at bedtime.   Carboxymeth-Glycerin-Polysorb (REFRESH OPTIVE MEGA-3 OP) Place 1 drop into both eyes 2 (two) times daily.   carvedilol (COREG) 3.125 MG tablet Take 1 tablet (3.125 mg total) by mouth 2 (two) times daily.   Cholecalciferol 25 MCG (1000 UT) capsule Take 1,000 Units by mouth daily.   cyanocobalamin (,VITAMIN B-12,) 1000 MCG/ML injection Inject 1,000 mcg into the muscle once. Monthly injection   folic acid (FOLVITE) 1  MG tablet Take 1 tablet (1 mg total) by mouth daily. Annual appt due in May must see provider for future refills (Patient taking differently: Take 1 mg by mouth at bedtime. Annual appt due in May must see provider for future refills)   furosemide (LASIX) 40 MG tablet Take 1 tablet by mouth every Monday, Wednesday, and Friday   hydrALAZINE (APRESOLINE) 100 MG tablet Take 1 tablet (100 mg total) by mouth 3 (three) times daily.   loratadine (CLARITIN) 10 MG tablet Take 10 mg by mouth daily as needed (for seasonal allergies).   Multiple Vitamin (MULTIVITAMIN) tablet Take 1 tablet by mouth daily.   Multiple Vitamins-Minerals (PRESERVISION AREDS 2 PO) Take 1 tablet by mouth in the morning and at bedtime.   nitroGLYCERIN (NITROSTAT) 0.4 MG SL tablet DISSOLVE 1 TABLET UNDER THE TONGUE EVERY 5 MINUTES AS NEEDED FOR CHEST PAIN, MAXIMUM 3 TABLETS   Saline (AYR NASAL MIST ALLERGY/SINUS NA) Place 2 sprays into the nose as needed (dryness).   trolamine salicylate (ASPERCREME) 10 % cream Apply 1 Application topically in the morning and at bedtime. For leg cramps   Vitamin D, Ergocalciferol, (DRISDOL) 1.25 MG (50000 UNIT) CAPS capsule Take 50,000 Units by mouth every 30 (thirty) days.   [DISCONTINUED] furosemide (LASIX) 40 MG tablet Take 1 tablet (40 mg total) by mouth as needed.   Current Facility-Administered  Medications for the 05/07/22 encounter (Office Visit) with Tonny Bollman, MD  Medication   NON FORMULARY 1 application     Allergies:   Aspirin, Lisinopril, Amoxicillin, Atarax [hydroxyzine hcl], Cephalexin, Ciprofloxacin, Clindamycin, Clobetasol, Codeine, Fish allergy, Fluarix [influenza virus vaccine], Haemophilus influenzae, Hydrocodone, Hydrocodone-acetaminophen, Hydroxyzine, Latex, Macrobid [nitrofurantoin macrocrystal], Niacin, Niacin-lovastatin er, Niacin-lovastatin er, Nitrofurantoin, Omeprazole, Other, Tramadol, Vibramycin [doxycycline], Adhesive [tape], Bactrim [sulfamethoxazole-trimethoprim], Colchicine, Gabapentin, and Nortriptyline   Social History   Socioeconomic History   Marital status: Married    Spouse name: [name]   Number of children: 2   Years of education: 13   Highest education level: Some college, no degree  Occupational History   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  married '52 - 3 years, divorced; married '56 - 3 years divorced; married '63-12 yrs - divorced; married '75 -. 1 son '57; 1 daughter - '53; 1 grandchild.  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  Very happily married.  End of life care: yes CPR, no long term mechanical ventilation, no heroic measures.right handed   Right handed   Social Determinants of Health   Financial Resource Strain: Low Risk  (07/28/2021)   Overall Financial Resource Strain (CARDIA)    Difficulty of Paying Living Expenses: Not  hard at all  Food Insecurity: No Food Insecurity (02/28/2022)   Hunger Vital Sign    Worried About Running Out of Food in the Last Year: Never true    Ran Out of Food in the Last Year: Never true  Transportation Needs:  No Transportation Needs (03/06/2022)   PRAPARE - Administrator, Civil Service (Medical): No    Lack of Transportation (Non-Medical): No  Physical Activity: Insufficiently Active (07/28/2021)   Exercise Vital Sign    Days of Exercise per Week: 2 days    Minutes of Exercise per Session: 20 min  Stress: No Stress Concern Present (07/28/2021)   [employer] of Occupational Health - Occupational Stress Questionnaire    Feeling of Stress : Not at all  Social Connections: Socially Integrated (07/28/2021)   Social Connection and Isolation Panel [NHANES]    Frequency of Communication with Friends and Family: Three times a week    Frequency of Social Gatherings with Friends and Family: Three times a week    Attends Religious Services: More than 4 times per year    Active Member of Clubs or Organizations: Yes    Attends Banker Meetings: 1 to 4 times per year    Marital Status: Married     Family History: The patient's family history includes Arthritis in his mother; Breast cancer in an other family member; Cancer in his brother; Coronary artery disease in his father; Heart disease in his brother; Nephritis in his brother; Other in his brother and mother; Prostate cancer in his brother. There is no history of Diabetes, Colon cancer, or Adrenal disorder.  ROS:   Please see the history of present illness.    All other systems reviewed and are negative.  EKGs/Labs/Other Studies Reviewed:    The following studies were reviewed today: Cardiac Studies & Procedures   CARDIAC CATHETERIZATION  CARDIAC CATHETERIZATION 03/02/2022  Narrative Images from the original result were not included. [name] is a 89 y.o. male   [id number] LOCATION:   FACILITY: [facility] PHYSICIAN: Nanetta Batty, M.D. [birth year]   DATE OF PROCEDURE:  03/02/2022  DATE OF DISCHARGE:     Right heart catheterization    History obtained from chart review.  89 year old married Caucasian male referred for right heart cath by Dr. Eden Emms to determine filling pressures.  He does have a history of severe asymmetric septal hypertrophy, remote pacemaker and TAVR in January of this year.  Echo reveals normal LV function with well-functioning aortic bioprosthesis.  The patient has elevated liver function tests and chronic renal insufficiency.  He was referred for right heart cath to determine his hemodynamics.   HEMODYNAMICS:  1: Right atrial pressure-11/10 2: Right ventricular pressure-21/0 3: Pulmonary artery pressure-44/13, mean 25 4: Pulmonary artery wedge pressure-A-wave 15, V wave 13, mean 10 Cardiac output-8.1 L/min with an index of 4.3 L/min/m.   IMPRESSION:Mr Richoux right heart cath suggested normal filling pressures.  He has mildly elevated pulmonary artery pressures.  Nanetta Batty. MD, [facility] 03/02/2022 10:14 AM   STRESS TESTS  MYOCARDIAL PERFUSION IMAGING 10/24/2017  Narrative  The left ventricular ejection fraction is normal (55-65%).  Nuclear stress EF: 56%.  Blood pressure demonstrated a hypertensive response to exercise.  There was no ST segment deviation noted during stress.  T wave inversion of 2 mm was noted during stress in the V1 and V2 leads, ending at 4 minutes of stress, and returning to baseline after 1-5 mins of recovery.  Defect 1: There is a large defect of moderate severity.  This is a low risk study.  No significant reversible ischemia. There is a large size, moderate intensity mostly fixed inferior defect, that mildly improves during upright positioning, suggestive of likely scar with a degree of bowel attenuation. There is also  a moderate sized and intensity inferolateral perfusion defect which does change with  supine or upright positioning, suggestive of scar. LVEF 56% with mild basal inferior hypokinesis. This is an intermediate risk study given the extent of fixed perfusion defects.   ECHOCARDIOGRAM  ECHOCARDIOGRAM COMPLETE 03/14/2022  Narrative ECHOCARDIOGRAM REPORT    Patient Name:   [name] Date of Exam: 03/14/2022 Medical Rec #:  [id number]          Height:       70.0 in Accession #:    [id number]         Weight:       157.0 lb Date of Birth:  1926/12/18          BSA:          1.883 m Patient Age:    89 years           BP:           136/78 mmHg Patient Gender: M                  HR:           67 bpm. Exam Location:  [address]  Procedure: 2D Echo, Cardiac Doppler, Color Doppler and 3D Echo  Indications:    1 month s/p TAVR Evaluation Z95.2  History:        Patient has prior history of Echocardiogram examinations, most recent 02/22/2022. CHF, CAD, Pacemaker and Prior CABG; Risk Factors:Hypertension and Dyslipidemia.  Sonographer:    Thurman Coyer RDCS Referring Phys: [phone] JILL D MCDANIEL  IMPRESSIONS   1. Left ventricular ejection fraction, by estimation, is 60 to 65%. The left ventricle has normal function. The left ventricle has no regional wall motion abnormalities. There is mild left ventricular hypertrophy. Left ventricular diastolic parameters are indeterminate. 2. Right ventricular systolic function is low normal. The right ventricular size is normal. There is moderately elevated pulmonary artery systolic pressure. 3. Mild to moderate mitral valve regurgitation. Moderate mitral annular calcification. 4. Tricuspid valve regurgitation is severe. 5. S/p TAVR (26 mm Edwards S3UR, procedure date 02/06/22) Peak and mean gradients through the valve are 15 and 8 mm Hg respectively.. The aortic valve has been repaired/replaced. Aortic valve regurgitation is not visualized. 6. Aortic dilatation noted. There is mild dilatation of the aortic root, measuring 39 mm. There  is mild dilatation of the ascending aorta, measuring 40 mm. 7. The inferior vena cava is dilated in size with >50% respiratory variability, suggesting right atrial pressure of 8 mmHg.  FINDINGS Left Ventricle: Left ventricular ejection fraction, by estimation, is 60 to 65%. The left ventricle has normal function. The left ventricle has no regional wall motion abnormalities. The left ventricular internal cavity size was normal in size. There is mild left ventricular hypertrophy. Left ventricular diastolic parameters are indeterminate.  Right Ventricle: The right ventricular size is normal. Right vetricular wall thickness was not assessed. Right ventricular systolic function is low normal. There is moderately elevated pulmonary artery systolic pressure. The tricuspid regurgitant velocity is 3.06 m/s, and with an assumed right atrial pressure of 8 mmHg, the estimated right ventricular systolic pressure is 45.5 mmHg.  Left Atrium: Left atrial size was normal in size.  Right Atrium: Right atrial size was normal in size.  Pericardium: There is no evidence of pericardial effusion.  Mitral Valve: There is mild thickening of the mitral valve leaflet(s). Moderate mitral annular calcification. Mild to moderate mitral valve regurgitation.  Tricuspid Valve: The tricuspid valve is  normal in structure. Tricuspid valve regurgitation is severe.  Aortic Valve: S/p TAVR (26 mm Edwards S3UR, procedure date 02/06/22) Peak and mean gradients through the valve are 15 and 8 mm Hg respectively. The aortic valve has been repaired/replaced. Aortic valve regurgitation is not visualized. Aortic valve mean gradient measures 8.0 mmHg. Aortic valve peak gradient measures 15.4 mmHg. Aortic valve area, by VTI measures 2.56 cm.  Pulmonic Valve: The pulmonic valve was normal in structure. Pulmonic valve regurgitation is mild.  Aorta: Aortic dilatation noted. There is mild dilatation of the aortic root, measuring 39 mm. There  is mild dilatation of the ascending aorta, measuring 40 mm.  Venous: The inferior vena cava is dilated in size with greater than 50% respiratory variability, suggesting right atrial pressure of 8 mmHg.  IAS/Shunts: No atrial level shunt detected by color flow Doppler.   LEFT VENTRICLE PLAX 2D LVIDd:         5.30 cm   Diastology LVIDs:         3.50 cm   LV e' medial:    8.17 cm/s LV PW:         1.10 cm   LV E/e' medial:  17.3 LV IVS:        1.30 cm   LV e' lateral:   11.60 cm/s LVOT diam:     2.40 cm   LV E/e' lateral: 12.2 LV SV:         107 LV SV Index:   57 LVOT Area:     4.52 cm  3D Volume EF: 3D EF:        58 % LV EDV:       152 ml LV ESV:       64 ml LV SV:        88 ml  RIGHT VENTRICLE RV Basal diam:  5.00 cm RV Mid diam:    4.40 cm RV S prime:     10.10 cm/s TAPSE (M-mode): 1.9 cm  LEFT ATRIUM           Index        RIGHT ATRIUM           Index LA diam:      4.90 cm 2.60 cm/m   RA Area:     26.00 cm LA Vol (A2C): 93.0 ml 49.38 ml/m  RA Volume:   77.90 ml  41.36 ml/m LA Vol (A4C): 82.3 ml 43.70 ml/m AORTIC VALVE AV Area (Vmax):    2.49 cm AV Area (Vmean):   2.41 cm AV Area (VTI):     2.56 cm AV Vmax:           196.00 cm/s AV Vmean:          132.000 cm/s AV VTI:            0.419 m AV Peak Grad:      15.4 mmHg AV Mean Grad:      8.0 mmHg LVOT Vmax:         108.00 cm/s LVOT Vmean:        70.400 cm/s LVOT VTI:          0.237 m LVOT/AV VTI ratio: 0.57  AORTA Ao Root diam: 3.30 cm Ao Asc diam:  4.00 cm  MITRAL VALVE                TRICUSPID VALVE MV Area (PHT): 3.91 cm     TR Peak grad:   37.5 mmHg MV Decel Time: 194  msec     TR Vmax:        306.00 cm/s MR Peak grad: 131.3 mmHg MR Mean grad: 77.0 mmHg     SHUNTS MR Vmax:      573.00 cm/s   Systemic VTI:  0.24 m MR Vmean:     402.0 cm/s    Systemic Diam: 2.40 cm MV E velocity: 141.00 cm/s MV A velocity: 99.70 cm/s MV E/A ratio:  1.41  Dietrich Pates MD Electronically signed by Dietrich Pates  MD Signature Date/Time: 03/14/2022/9:40:24 PM    Final     CT SCANS  CT CORONARY MORPH W/CTA COR W/SCORE 01/11/2022  Addendum 01/11/2022  8:11 AM ADDENDUM REPORT: 01/11/2022 08:09  EXAM: OVER-READ INTERPRETATION  CT CHEST  The following report is an over-read performed by radiologist Dr. Lesia Hausen [facility] on 01/11/2022. This over-read does not include interpretation of cardiac or coronary anatomy or pathology. The cardiac CTA interpretation by the cardiologist is attached.  COMPARISON:  02/14/2011 chest CT.  FINDINGS: Please see the separate concurrent chest CT angiogram report for details.  IMPRESSION: Please see the separate concurrent chest CT angiogram report for details.   Electronically Signed By: Delbert Phenix M.D. On: 01/11/2022 08:09  Narrative CLINICAL DATA:  64M with severe aortic stenosis being evaluated for a TAVR procedure.  EXAM: Cardiac TAVR CT  TECHNIQUE: The patient was scanned on a [employer]. A 120 kV retrospective scan was triggered in the descending thoracic aorta at 111 HU's. Gantry rotation speed was 250 msecs and collimation was .6 mm. No beta blockade or nitro were given. The 3D data set was reconstructed in 5% intervals of the R-R cycle. Systolic and diastolic phases were analyzed on a dedicated work station using MPR, MIP and VRT modes. The patient received 100 cc of contrast.  FINDINGS: Aortic Root:  Aortic valve: Trileaflet  Aortic valve calcium score: 1870  Aortic annulus:  Diameter: 28mm x 24mm  Perimeter: 80mm  Area: 497 mm^2  Calcifications: No calcifications  Coronary height: Min Left - 11mm, Min Right - 11mm  Sinotubular height: Left cusp - 18mm; Right cusp - 17mm; Noncoronary cusp - 21mm  LVOT (as measured 3 mm below the annulus):  Diameter: 30mm x 23mm  Area: 491 mm^2  Calcifications: No calcifications  Aortic sinus width: Left cusp - 31mm; Right cusp - 30mm;  Noncoronary cusp - 32mm  Sinotubular junction width: 29mm x 27mm  Optimum Fluoroscopic Angle for Delivery: LAO 2 CAU 18  Cardiac:  Right atrium: Mild enlargement  Right ventricle: Normal size  Pulmonary arteries: Normal size  Pulmonary veins: Normal configuration  Left atrium: Mild enlargement.  PFO  Left ventricle: Moderate hypertrophy. Normal size. Basal to mid inferior akinesis.  Pericardium: Normal thickness  Coronary arteries: S/p CABG with patent LIMA-LAD. SVG-PDA and SVG-OM1 are occluded  IMPRESSION: 1. Trileaflet aortic valve with moderate calcifications (AV calcium score 1870)  2. Aortic annulus measures 28mm x 24mm in diameter with perimeter 80mm and area 42mm^2. No annular or LVOT calcifications. Annular measurements are suitable for delivery of 26mm Edwards Sapien 3 valve.  3.  Low coronary height to left main (11mm) and RCA (11mm)  4.  Optimum Fluoroscopic Angle for Delivery:  LAO 2 CAU 18  5.  S/p CABG with patent LIMA-LAD.  SVG-PDA and SVG-OM1 are occluded  6.  Basal to mid LV inferior wall akinesis.  Electronically Signed: By: Epifanio Lesches M.D. On: 01/10/2022 12:18  Recent Labs: 02/27/2022: B Natriuretic Peptide 490.4 02/28/2022: Magnesium 2.4 04/03/2022: TSH 1.940 04/05/2022: ALT 14; Platelets 321 04/25/2022: BUN 45; Creatinine, Ser 2.18; Hemoglobin 8.6; Potassium 4.0; Sodium 136  Recent Lipid Panel    Component Value Date/Time   CHOL 122 09/04/2016 1123   TRIG 59.0 09/04/2016 1123   TRIG 107 01/16/2006 1338   HDL 40.40 09/04/2016 1123   CHOLHDL 3 09/04/2016 1123   VLDL 11.8 09/04/2016 1123   LDLCALC 70 09/04/2016 1123     Risk Assessment/Calculations:          Physical Exam:    VS:  BP (!) 150/80   Pulse 78   Ht 5\' 10"  (1.778 m)   Wt 157 lb 3.2 oz (71.3 kg)   SpO2 98%   BMI 22.56 kg/m     Wt Readings from Last 3 Encounters:  05/07/22 157 lb 3.2 oz (71.3 kg)  04/25/22 148 lb (67.1 kg)  04/10/22 153  lb 9.6 oz (69.7 kg)     GEN: Pleasant elderly male in no acute distress HEENT: Normal NECK: No JVD; No carotid bruits LYMPHATICS: No lymphadenopathy CARDIAC: RRR, 2/6 ejection murmur at the right upper sternal border RESPIRATORY:  Clear to auscultation without rales, wheezing or rhonchi  ABDOMEN: Soft, non-tender, non-distended MUSCULOSKELETAL: 1+ bilateral ankle edema; No deformity  SKIN: Warm and dry NEUROLOGIC:  Alert and oriented x 3 PSYCHIATRIC:  Normal affect   ASSESSMENT:    1. S/P TAVR (transcatheter aortic valve replacement)   2. Essential hypertension   3. Chronic diastolic CHF (congestive heart failure) (HCC)   4. Coronary artery disease involving native coronary artery of native heart without angina pectoris   5. Stage 3b chronic kidney disease (HCC)    PLAN:    In order of problems listed above:  Normal valve function on postop echo assessment.  The patient's left ventricular function is normal and his mean transaortic gradient is 8 mmHg with no paravalvular regurgitation on his most recent echo from March 14, 2022. Blood pressure remains elevated.  Advised to schedule furosemide 40 mg every Monday, Wednesday, and Friday.  Advised to add carvedilol 3.125 mg twice daily.  Advised to continue amlodipine 5 mg daily and hydralazine 100 mg 3 times daily. He will start taking scheduled furosemide on Mondays, Wednesdays, and Fridays. Continue current management, no angina Followed closely by nephrology.  Will need to keep an eye on his creatinine with scheduled furosemide, but I suspect he will tolerate 3 days/week of furosemide.  For cardiology follow-up, the patient is scheduled to see me on July 09, 2022.    Cardiac Rehabilitation Eligibility Assessment       Medication Adjustments/Labs and Tests Ordered: Current medicines are reviewed at length with the patient today.  Concerns regarding medicines are outlined above.  Orders Placed This Encounter  Procedures   AMB  referral to cardiac rehabilitation   Meds ordered this encounter  Medications   furosemide (LASIX) 40 MG tablet    Sig: Take 1 tablet by mouth every Monday, Wednesday, and Friday    Dispense:  36 tablet    Refill:  3   carvedilol (COREG) 3.125 MG tablet    Sig: Take 1 tablet (3.125 mg total) by mouth 2 (two) times daily.    Dispense:  180 tablet    Refill:  3    Patient Instructions  Medication Instructions:  TAKE Furosemide 40mg  every Monday, Wednesday, and Friday START Carvedilol 3.125mg  twice daily *If you need a refill on your cardiac  medications before your next appointment, please call your pharmacy*   Lab Work: NONE If you have labs (blood work) drawn today and your tests are completely normal, you will receive your results only by: MyChart Message (if you have MyChart) OR A paper copy in the mail If you have any lab test that is abnormal or we need to change your treatment, we will call you to review the results.   Testing/Procedures: Ambulatory Referral to Cardiac Rehabilitation (TAVR)   Follow-Up: At [facility], you and your health needs are our priority.  As part of our continuing mission to provide you with exceptional heart care, we have created designated Provider Care Teams.  These Care Teams include your primary Cardiologist (physician) and Advanced Practice Providers (APPs -  Physician Assistants and Nurse Practitioners) who all work together to provide you with the care you need, when you need it.  We recommend signing up for the patient portal called "MyChart".  Sign up information is provided on this After Visit Summary.  MyChart is used to connect with patients for Virtual Visits (Telemedicine).  Patients are able to view lab/test results, encounter notes, upcoming appointments, etc.  Non-urgent messages can be sent to your provider as well.   To learn more about what you can do with MyChart, go to [email or web address].    Your next  appointment:   1 year(s)  Provider:   Tonny Bollman, MD        Signed, Tonny Bollman, MD  05/07/2022 5:44 PM    [facility]

## 2022-05-07 NOTE — Patient Instructions (Signed)
Medication Instructions:  TAKE Furosemide 40mg  every Monday, Wednesday, and Friday START Carvedilol 3.125mg  twice daily *If you need a refill on your cardiac medications before your next appointment, please call your pharmacy*   Lab Work: NONE If you have labs (blood work) drawn today and your tests are completely normal, you will receive your results only by: MyChart Message (if you have MyChart) OR A paper copy in the mail If you have any lab test that is abnormal or we need to change your treatment, we will call you to review the results.   Testing/Procedures: Ambulatory Referral to Cardiac Rehabilitation (TAVR)   Follow-Up: At [facility], you and your health needs are our priority.  As part of our continuing mission to provide you with exceptional heart care, we have created designated Provider Care Teams.  These Care Teams include your primary Cardiologist (physician) and Advanced Practice Providers (APPs -  Physician Assistants and Nurse Practitioners) who all work together to provide you with the care you need, when you need it.  We recommend signing up for the patient portal called "MyChart".  Sign up information is provided on this After Visit Summary.  MyChart is used to connect with patients for Virtual Visits (Telemedicine).  Patients are able to view lab/test results, encounter notes, upcoming appointments, etc.  Non-urgent messages can be sent to your provider as well.   To learn more about what you can do with MyChart, go to [email or web address].    Your next appointment:   1 year(s)  Provider:   Tonny Bollman, MD

## 2022-05-11 ENCOUNTER — Telehealth (HOSPITAL_COMMUNITY): Payer: Self-pay

## 2022-05-11 ENCOUNTER — Ambulatory Visit: Payer: Medicare Other

## 2022-05-11 ENCOUNTER — Encounter (HOSPITAL_COMMUNITY): Payer: Self-pay

## 2022-05-11 NOTE — Telephone Encounter (Signed)
Attempted to call patient in regards to Cardiac Rehab - LM on VM Mailed letter 

## 2022-05-15 ENCOUNTER — Telehealth (HOSPITAL_COMMUNITY): Payer: Self-pay

## 2022-05-15 NOTE — Telephone Encounter (Signed)
Reviewed with patient the Cardiac Rehab Cardiac Risk [place] Nursing Assessment. Patient knows where our office is located.  

## 2022-05-17 ENCOUNTER — Encounter (HOSPITAL_COMMUNITY): Payer: Self-pay

## 2022-05-17 ENCOUNTER — Encounter (HOSPITAL_COMMUNITY)
Admission: RE | Admit: 2022-05-17 | Discharge: 2022-05-17 | Disposition: A | Discharge status: Home / Self Care | Payer: Medicare Other | Source: Ambulatory Visit | Attending: Cardiology | Admitting: Cardiology

## 2022-05-17 ENCOUNTER — Encounter: Payer: Self-pay | Admitting: Internal Medicine

## 2022-05-17 VITALS — BP 132/74 | HR 68 | Ht 67.5 in | Wt 160.1 lb

## 2022-05-17 DIAGNOSIS — Z952 Presence of prosthetic heart valve: Secondary | ICD-10-CM | POA: Diagnosis present

## 2022-05-17 NOTE — Progress Notes (Signed)
Cardiac Rehab Medication Review by a Nurse  Does the patient  feel that his/her medications are working for him/her?  yes  Has the patient been experiencing any side effects to the medications prescribed?  no  Does the patient measure his/her own blood pressure or blood glucose at home?  yes   Does the patient have any problems obtaining medications due to transportation or finances?   no  Understanding of regimen: good Understanding of indications: fair Potential of compliance: good    Nurse comments: [name] and his wife say they are taking his medications as prescribed. [name] says he checks his blood pressures daily.    Arta Bruce Arsema Tusing RN 05/17/2022 1:25 PM

## 2022-05-17 NOTE — Patient Instructions (Addendum)
      Blood work was ordered.   The lab is on the first floor.    Medications changes include :   start lasix 40 mg daily x 4-5 days then decrease to three times a week.  Hycodan cough syrup for night.         Return if symptoms worsen or fail to improve, for follow up in August - cancel July appt.

## 2022-05-17 NOTE — Progress Notes (Signed)
Cardiac Individual Treatment Plan  Patient Details  Name: [name] MRN: [id number] Date of Birth: 1926-09-24 Referring Provider:   Flowsheet Row INTENSIVE CARDIAC REHAB ORIENT from 05/17/2022 in [facility]  Referring Provider Dr. Tonny Bollman, MD       Initial Encounter Date:  Flowsheet Row INTENSIVE CARDIAC REHAB ORIENT from 05/17/2022 in [facility]  Date 05/17/22       Visit Diagnosis: 02/06/22 S/P TAVR (transcatheter aortic valve replacement)  Patient's Home Medications on Admission:  Current Outpatient Medications:    acetaminophen (TYLENOL) 500 MG tablet, Take 1,000 mg by mouth 3 (three) times daily as needed for moderate pain., Disp: , Rfl:    allopurinol (ZYLOPRIM) 100 MG tablet, TAKE 1 TABLET(100 MG) BY MOUTH DAILY (Patient taking differently: Take 100 mg by mouth daily. TAKE 1 TABLET(100 MG) BY MOUTH DAILY), Disp: 30 tablet, Rfl: 0   amLODipine (NORVASC) 5 MG tablet, Take 1 tablet (5 mg total) by mouth daily., Disp: 90 tablet, Rfl: 1   amoxicillin (AMOXIL) 500 MG capsule, Take 2,000 mg by mouth as directed. 1 HOUR PRIOR TO DENTAL CLEANINGS AND PROCEDURES, Disp: , Rfl:    Aromatic Inhalants (VICKS VAPOR INHALER IN), Place 1 puff into both nostrils as needed (for congestion)., Disp: , Rfl:    Ascorbic Acid (VITAMIN C) 1000 MG tablet, Take 1,000 mg by mouth daily., Disp: , Rfl:    aspirin EC 81 MG tablet, Take 81 mg by mouth daily., Disp: , Rfl:    Calcium Citrate-Vitamin D (CITRACAL + D PO), Take 2 tablets by mouth in the morning and at bedtime., Disp: , Rfl:    Carboxymeth-Glycerin-Polysorb (REFRESH OPTIVE MEGA-3 OP), Place 1 drop into both eyes 2 (two) times daily., Disp: , Rfl:    carvedilol (COREG) 3.125 MG tablet, Take 1 tablet (3.125 mg total) by mouth 2 (two) times daily., Disp: 180 tablet, Rfl: 3   Cholecalciferol 25 MCG (1000 UT) capsule,  Take 1,000 Units by mouth daily., Disp: , Rfl:    cyanocobalamin (,VITAMIN B-12,) 1000 MCG/ML injection, Inject 1,000 mcg into the muscle once. Monthly injection, Disp: , Rfl:    folic acid (FOLVITE) 1 MG tablet, Take 1 tablet (1 mg total) by mouth daily. Annual appt due in May must see provider for future refills (Patient taking differently: Take 1 mg by mouth at bedtime. Annual appt due in May must see provider for future refills), Disp: 90 tablet, Rfl: 2   furosemide (LASIX) 40 MG tablet, Take 1 tablet by mouth every Monday, Wednesday, and Friday, Disp: 36 tablet, Rfl: 3   hydrALAZINE (APRESOLINE) 100 MG tablet, Take 1 tablet (100 mg total) by mouth 3 (three) times daily., Disp: 90 tablet, Rfl: 5   loratadine (CLARITIN) 10 MG tablet, Take 10 mg by mouth daily as needed (for seasonal allergies)., Disp: , Rfl:    nitroGLYCERIN (NITROSTAT) 0.4 MG SL tablet, DISSOLVE 1 TABLET UNDER THE TONGUE EVERY 5 MINUTES AS NEEDED FOR CHEST PAIN, MAXIMUM 3 TABLETS, Disp: 100 tablet, Rfl: 1   Saline (AYR NASAL MIST ALLERGY/SINUS NA), Place 2 sprays into the nose as needed (dryness)., Disp: , Rfl:    trolamine salicylate (ASPERCREME) 10 % cream, Apply 1 Application topically in the morning and at bedtime. For leg cramps, Disp: , Rfl:    Vitamin D, Ergocalciferol, (DRISDOL) 1.25 MG (50000 UNIT) CAPS capsule, Take 50,000 Units by mouth every 30 (thirty)  days., Disp: , Rfl:    Multiple Vitamin (MULTIVITAMIN) tablet, Take 1 tablet by mouth daily. (Patient not taking: Reported on 05/17/2022), Disp: , Rfl:    Multiple Vitamins-Minerals (PRESERVISION AREDS 2 PO), Take 1 tablet by mouth in the morning and at bedtime. (Patient not taking: Reported on 05/17/2022), Disp: , Rfl:   Current Facility-Administered Medications:    NON FORMULARY 1 application, 1 application , Topical, PRN, Asencion Islam, DPM  Past Medical History: Past Medical History:  Diagnosis Date   Anemia    AV block, 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  03/26/2012   Bilateral plantar fasciitis 10/25/2020   CAD (coronary artery disease)    a. S/P stenting to mid RCA, prox PDA 06/1999. b. NSTEMI/CABG x 3 in 10/2011 with LIMA to LAD, SVG to PDA, and SVG to OM1.    Cephalalgia 07/14/2015   CHB (complete heart block) 05/03/2012   Overview:  Status post complete heart block heart rate 28 bpm, alternating with 2 to one AV block and drug for bradycardia.  Status post pacemaker implantation.status post Medtronic pacemaker implantation the 03/28/2012   Chronic diastolic CHF (congestive heart failure) 02/05/2018   Chronic gouty arthritis    Chronic UTI    a. Followed by Dr. [name] Caprice - colonized/asymptomatic - not on abx   CKD (chronic kidney disease)    stage 3, GFR 30-59 ml/min; stable with a creatinine around 1.9-2.0 followed by nephrology.   Constipation 03/01/2015   Symptoms and exam consistent with constipation. Abdominal exam is benign with no evidence of pain or obstruction. Discussed importance of increasing fiber and water intake coupled with physical activity to assist with bowel movements. Continue over-the-counter medication management as needed. Follow-up if symptoms worsen or fail to improve.   Coronary atherosclerosis 11/27/2011   Overview:  Multivessel coronary artery disease recently diagnosed by catheterization 2013 History of stent placement with a heparin-coated stent 2001  Last Assessment & Plan:  Status post coronary bypass grafting.  No recurrent chest pain. Overview:  Overview:  S/P stenting to mid RCA, prox PDA 06/1999;  06/2007 Myoview: negative except for apical thinning, EF 68%, last Myoview August 10, 2008 with n   Degeneration of lumbar intervertebral disc    Degenerative disc disease, cervical, with radiculopathy 09/21/2008   Diastolic dysfunction 07/04/2017   Grade 1 DD on Echo 06/2017   Dyslipidemia    Dysphagia 02/06/2016   3/18 - DG Esophagus:  1. Mild esophageal dysmotility, likely presbyesophagus. 2. No other explanation for  patient's symptoms. 3. Small hiatal hernia.  On swallow eval - evidence of cervical spine disease and that was likely contributing   Epistaxis 12/08/2020   Essential hypertension 07/20/2006   GERD (gastroesophageal reflux disease)    Gout 05/12/2018   Gouty arthritis of right great toe 09/03/2017   Left toe Injected January 31, 2018    Hyperpigmentation 11/04/2017   Internal hemorrhoids 08/15/2016   Left inguinal hernia 07/01/2019   Lumbar post-laminectomy syndrome 12/12/2017   Moderate aortic regurgitation 07/04/2017   Echo 06/2017:  EF 55-60%, mild LVH, grade 1 DD, mild AS, mod AR, mild MR, mild-mod TR   Neck injury    a. 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  This was switched to by mouth amiodarone at the time of the thoracic surgery appointment was discontinued.   Presence of aortocoronary bypass graft 10/24/2011   Overview:  Performed at [facility] 2013 Last Assessment & Plan:  No complication post coronary bypass grafting.   Prostate cancer (HCC)    a. 2001 s/p TURP.   Pulmonary nodule    a. felt to be noncancerous.  Status post followup CT scan 4 mm and stable.   Renal artery stenosis    a. 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 Excell Seltzer & Dr. 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    Trochanteric bursitis of hip, bilateral 01/10/2017   UGIB (upper gastrointestinal bleed) 02/01/2018   EGD  02/06/18 - mod, non-erosive gastritis   Venous insufficiency of leg 06/05/2010   Vitamin B12 deficiency 08/23/2009   Jan '14  July '14 B12 level  >1500    472   Weakness of both lower extremities 04/09/2020    Tobacco Use: Social History   Tobacco Use  Smoking Status Never  Smokeless Tobacco Never    Labs: Review Flowsheet  More data exists      Latest Ref Rng & Units 08/31/2014 09/01/2015 09/04/2016 02/06/2022 03/02/2022  Labs for ITP Cardiac and Pulmonary Rehab  Cholestrol 0 - 200 mg/dL 161  096  045  - -  LDL (calc) 0 - 99 mg/dL 56  70  70  - -  HDL-C >39.00 mg/dL 40.98  11.91  47.82  - -  Trlycerides 0.0 - 149.0 mg/dL 95.6  213.0  86.5  - -  PH, Arterial 7.35 - 7.45 7.35 - 7.45 - - - - 7.410  7.411   PCO2 arterial 32 - 48 mmHg 32 - 48 mmHg - - - - 29.6  29.1   Bicarbonate 20.0 - 28.0 mmol/L 20.0 - 28.0 mmol/L - - - - 18.7  18.5   TCO2 22 - 32 mmol/L 22 - 32 mmol/L - - - 20  20  19    Acid-base deficit 0.0 - 2.0 mmol/L 0.0 - 2.0 mmol/L - - - - 5.0  5.0   O2 Saturation % % - - - - 66  67     Capillary Blood Glucose: Lab Results  Component Value Date   GLUCAP 105 (H) 10/22/2011   GLUCAP 99 10/22/2011   GLUCAP 88 10/22/2011   GLUCAP 144 (H) 10/21/2011   GLUCAP 153 (H) 10/21/2011     Exercise Target Goals: Exercise Program Goal: Individual exercise prescription set using results from initial 6 min walk test and THRR while considering  patient's activity barriers and safety.   Exercise Prescription Goal: Initial exercise prescription builds to 30-45 minutes a day of aerobic activity, 2-3 days per week.  Home exercise guidelines will be given to patient during program as part of exercise prescription that the participant will acknowledge.  Activity Barriers & Risk Stratification:  Activity Barriers & Cardiac Risk Stratification - 05/17/22 1551       Activity  Barriers & Cardiac Risk Stratification   Activity Barriers Joint Problems;Assistive Device;Deconditioning;Arthritis;Back Problems;Muscular Weakness;Decreased Ventricular Function;Neck/Spine Problems;Balance Concerns;History of Falls  Cardiac Risk Stratification High             6 Minute Walk:  6 Minute Walk     Row Name 05/17/22 1543         6 Minute Walk   Phase Initial  Nustep test due to poor vision     Distance 1476 feet     Walk Time 6 minutes     # of Rest Breaks 0     MPH 2.8     METS 2.49     RPE 12     Perceived Dyspnea  0     VO2 Peak 8.72     Symptoms Yes (comment)     Comments 2 mins in decreased SPM due to high HR, asymptomatic. Bilateral knee to foot pain 4/10, resolved with rest. AVG km 0.45, [address]VE [place]: 28, AVG SPM 86, total steps 519, total feet: 1476     Resting HR 68 bpm     Resting BP 132/74     Resting Oxygen Saturation  99 %     Exercise Oxygen Saturation  during 6 min walk 100 %     Max Ex. HR 117 bpm     Max Ex. BP 154/66     2 Minute Post BP 146/72              Oxygen Initial Assessment:   Oxygen Re-Evaluation:   Oxygen Discharge (Final Oxygen Re-Evaluation):   Initial Exercise Prescription:  Initial Exercise Prescription - 05/17/22 1500       Date of Initial Exercise RX and Referring Provider   Date 05/17/22    Referring Provider Dr. Tonny Bollman, MD    Expected Discharge Date 07/27/22      NuStep   Level 1    SPM 85    Minutes 25    METs 1.9      Prescription Details   Frequency (times per week) 3    Duration Progress to 30 minutes of continuous aerobic without signs/symptoms of physical distress      Intensity   THRR 40-80% of Max Heartrate 51-102    Ratings of Perceived Exertion 11-13    Perceived Dyspnea 0-4      Progression   Progression Continue progressive overload as per policy without signs/symptoms or physical distress.      Resistance Training   Training Prescription Yes    Weight 2    Reps 10-15              Perform Capillary Blood Glucose checks as needed.  Exercise Prescription Changes:   Exercise Comments:   Exercise Goals and Review:   Exercise Goals     Row Name 05/17/22 1554             Exercise Goals   Increase Physical Activity Yes       Intervention Provide advice, education, support and counseling about physical activity/exercise needs.;Develop an individualized exercise prescription for aerobic and resistive training based on initial evaluation findings, risk stratification, comorbidities and participant's personal goals.       Expected Outcomes Short Term: Attend rehab on a regular basis to increase amount of physical activity.;Long Term: Exercising regularly at least 3-5 days a week.;Long Term: Add in home exercise to make exercise part of routine and to increase amount of physical activity.       Increase Strength and Stamina Yes       Intervention Provide advice, education, support and counseling about physical activity/exercise needs.;Develop  an individualized exercise prescription for aerobic and resistive training based on initial evaluation findings, risk stratification, comorbidities and participant's personal goals.       Expected Outcomes Short Term: Increase workloads from initial exercise prescription for resistance, speed, and METs.;Short Term: Perform resistance training exercises routinely during rehab and add in resistance training at home;Long Term: Improve cardiorespiratory fitness, muscular endurance and strength as measured by increased METs and functional capacity ( )       Able to understand and use rate of perceived exertion (RPE) scale Yes       Intervention Provide education and explanation on how to use RPE scale       Expected Outcomes Short Term: Able to use RPE daily in rehab to express subjective intensity level;Long Term:  Able to use RPE to guide intensity level when exercising independently       Knowledge and understanding  of Target Heart Rate Range (THRR) Yes       Intervention Provide education and explanation of THRR including how the numbers were predicted and where they are located for reference       Expected Outcomes Short Term: Able to state/look up THRR;Long Term: Able to use THRR to govern intensity when exercising independently;Short Term: Able to use daily as guideline for intensity in rehab       Understanding of Exercise Prescription Yes       Intervention Provide education, explanation, and written materials on patient's individual exercise prescription       Expected Outcomes Short Term: Able to explain program exercise prescription;Long Term: Able to explain home exercise prescription to exercise independently                Exercise Goals Re-Evaluation :   Discharge Exercise Prescription (Final Exercise Prescription Changes):   Nutrition:  Target Goals: Understanding of nutrition guidelines, daily intake of sodium 1500mg , cholesterol 200mg , calories 30% from fat and 7% or less from saturated fats, daily to have 5 or more servings of fruits and vegetables.  Biometrics:  Pre Biometrics - 05/17/22 1530       Pre Biometrics   Waist Circumference 38.75 inches    Hip Circumference 37.75 inches    Waist to Hip Ratio 1.03 %    Triceps Skinfold 5 mm    % Body Fat 22.4 %    Grip Strength 30 kg    Flexibility --   Did not attempt due to spinal fusion on hip pain   Single Leg Stand --   Did not attempt due to falls and assistive device             Nutrition Therapy Plan and Nutrition Goals:   Nutrition Assessments:  MEDIFICTS Score Key: ?70 Need to make dietary changes  40-70 Heart Healthy Diet ? 40 Therapeutic Level Cholesterol Diet    Picture Your Plate Scores: <16 Unhealthy dietary pattern with much room for improvement. 41-50 Dietary pattern unlikely to meet recommendations for good health and room for improvement. 51-60 More healthful dietary pattern, with some room  for improvement.  >60 Healthy dietary pattern, although there may be some specific behaviors that could be improved.    Nutrition Goals Re-Evaluation:   Nutrition Goals Re-Evaluation:   Nutrition Goals Discharge (Final Nutrition Goals Re-Evaluation):   Psychosocial: Target Goals: Acknowledge presence or absence of significant depression and/or stress, maximize coping skills, provide positive support system. Participant is able to verbalize types and ability to use techniques and skills needed for reducing stress and depression.  Initial Review & Psychosocial Screening:  Initial Psych Review & Screening - 05/17/22 1405       Initial Review   Current issues with Current Stress Concerns    Source of Stress Concerns Chronic Illness;Unable to participate in former interests or hobbies;Unable to perform yard/household activities    Comments Jaxstyn says he cant do alot due to shortness of breath and visual impairement. Kerion says he is loosing his sight, his wife drives for him      Family Dynamics   Good Support System? Yes   Jaime has his wife for support, they also have neighbors, neices and nephews who live in [place]     Barriers   Psychosocial barriers to participate in program The patient should benefit from training in stress management and relaxation.      Screening Interventions   Interventions Encouraged to exercise;Provide feedback about the scores to participant    Expected Outcomes Long Term goal: The participant improves quality of Life and PHQ9 Scores as seen by post scores and/or verbalization of changes             Quality of Life Scores:  Quality of Life - 05/17/22 1557       Quality of Life   Select Quality of Life      Quality of Life Scores   Health/Function Pre 21.23 %    Socioeconomic Pre 30 %    Psych/Spiritual Pre 30 %    Family Pre 30 %    GLOBAL Pre 26.2 %            Scores of 19 and below usually indicate a poorer quality of  life in these areas.  A difference of  2-3 points is a clinically meaningful difference.  A difference of 2-3 points in the total score of the Quality of Life Index has been associated with significant improvement in overall quality of life, self-image, physical symptoms, and general health in studies assessing change in quality of life.  PHQ-9: Review Flowsheet  More data exists      05/17/2022 03/09/2022 11/03/2021 08/02/2021 07/28/2021  Depression screen PHQ 2/9  Decreased Interest 0 0 0 1 0 0 0  Down, Depressed, Hopeless 0 0 0 0 0 0 0  PHQ - 2 Score 0 0 0 1 0 0 0  Altered sleeping 0 - - 3 -  Tired, decreased energy 3 - - 2 -  Change in appetite 0 - - 0 -  Feeling bad or failure about yourself  1 - - 0 -  Trouble concentrating 0 - - 0 -  Moving slowly or fidgety/restless 1 - - 1 -  Suicidal thoughts 0 - - 0 -  PHQ-9 Score 5 - - 7 -  Difficult doing work/chores Not difficult at all - - Somewhat difficult -   Interpretation of Total Score  Total Score Depression Severity:  1-4 = Minimal depression, 5-9 = Mild depression, 10-14 = Moderate depression, 15-19 = Moderately severe depression, 20-27 = Severe depression   Psychosocial Evaluation and Intervention:   Psychosocial Re-Evaluation:   Psychosocial Discharge (Final Psychosocial Re-Evaluation):   Vocational Rehabilitation: Provide vocational rehab assistance to qualifying candidates.   Vocational Rehab Evaluation & Intervention:  Vocational Rehab - 05/17/22 1408       Initial Vocational Rehab Evaluation & Intervention   Assessment shows need for Vocational Rehabilitation No   Trayvonne is retired and does not need vocational rehab at this time  Education: Education Goals: Education classes will be provided on a weekly basis, covering required topics. Participant will state understanding/return demonstration of topics presented.     Core Videos: Exercise    Move It!  Clinical staff conducted group or  individual video education with verbal and written material and guidebook.  Patient learns the recommended Pritikin exercise program. Exercise with the goal of living a long, healthy life. Some of the health benefits of exercise include controlled diabetes, healthier blood pressure levels, improved cholesterol levels, improved heart and lung capacity, improved sleep, and better body composition. Everyone should speak with their doctor before starting or changing an exercise routine.  Biomechanical Limitations Clinical staff conducted group or individual video education with verbal and written material and guidebook.  Patient learns how biomechanical limitations can impact exercise and how we can mitigate and possibly overcome limitations to have an impactful and balanced exercise routine.  Body Composition Clinical staff conducted group or individual video education with verbal and written material and guidebook.  Patient learns that body composition (ratio of muscle mass to fat mass) is a key component to assessing overall fitness, rather than body weight alone. Increased fat mass, especially visceral belly fat, can put [place] at increased risk for metabolic syndrome, type 2 diabetes, heart disease, and even death. It is recommended to combine diet and exercise (cardiovascular and resistance training) to improve your body composition. Seek guidance from your physician and exercise physiologist before implementing an exercise routine.  Exercise Action Plan Clinical staff conducted group or individual video education with verbal and written material and guidebook.  Patient learns the recommended strategies to achieve and enjoy long-term exercise adherence, including variety, self-motivation, self-efficacy, and positive decision making. Benefits of exercise include fitness, good health, weight management, more energy, better sleep, less stress, and overall well-being.  Medical   Heart Disease Risk  Reduction Clinical staff conducted group or individual video education with verbal and written material and guidebook.  Patient learns our heart is our most vital organ as it circulates oxygen, nutrients, white blood cells, and hormones throughout the entire body, and carries waste away. Data supports a plant-based eating plan like the Pritikin Program for its effectiveness in slowing progression of and reversing heart disease. The video provides a number of recommendations to address heart disease.   Metabolic Syndrome and Belly Fat  Clinical staff conducted group or individual video education with verbal and written material and guidebook.  Patient learns what metabolic syndrome is, how it leads to heart disease, and how one can reverse it and keep it from coming back. You have metabolic syndrome if you have 3 of the following 5 criteria: abdominal obesity, high blood pressure, high triglycerides, low HDL cholesterol, and high blood sugar.  Hypertension and Heart Disease Clinical staff conducted group or individual video education with verbal and written material and guidebook.  Patient learns that high blood pressure, or hypertension, is very common in the [place]. Hypertension is largely due to excessive salt intake, but other important risk factors include being overweight, physical inactivity, drinking too much alcohol, smoking, and not eating enough potassium from fruits and vegetables. High blood pressure is a leading risk factor for heart attack, stroke, congestive heart failure, dementia, kidney failure, and premature death. Long-term effects of excessive salt intake include stiffening of the arteries and thickening of heart muscle and organ damage. Recommendations include ways to reduce hypertension and the risk of heart disease.  Diseases of Our Time - Focusing on Diabetes Clinical  staff conducted group or individual video education with verbal and written material and guidebook.   Patient learns why the best way to stop diseases of our time is prevention, through food and other lifestyle changes. Medicine (such as prescription pills and surgeries) is often only a Band-Aid on the problem, not a long-term solution. Most common diseases of our time include obesity, type 2 diabetes, hypertension, heart disease, and cancer. The Pritikin Program is recommended and has been proven to help reduce, reverse, and/or prevent the damaging effects of metabolic syndrome.  Nutrition   Overview of the Pritikin Eating Plan  Clinical staff conducted group or individual video education with verbal and written material and guidebook.  Patient learns about the Pritikin Eating Plan for disease risk reduction. The Pritikin Eating Plan emphasizes a wide variety of unrefined, minimally-processed carbohydrates, like fruits, vegetables, whole grains, and legumes. Go, Caution, and Stop food choices are explained. Plant-based and lean animal proteins are emphasized. Rationale provided for low sodium intake for blood pressure control, low added sugars for blood sugar stabilization, and low added fats and oils for coronary artery disease risk reduction and weight management.  Calorie Density  Clinical staff conducted group or individual video education with verbal and written material and guidebook.  Patient learns about calorie density and how it impacts the Pritikin Eating Plan. Knowing the characteristics of the food you choose will help you decide whether those foods will lead to weight gain or weight loss, and whether you want to consume more or less of them. Weight loss is usually a side effect of the Pritikin Eating Plan because of its focus on low calorie-dense foods.  Label Reading  Clinical staff conducted group or individual video education with verbal and written material and guidebook.  Patient learns about the Pritikin recommended label reading guidelines and corresponding recommendations  regarding calorie density, added sugars, sodium content, and whole grains.  Dining Out - Part 1  Clinical staff conducted group or individual video education with verbal and written material and guidebook.  Patient learns that restaurant meals can be sabotaging because they can be so high in calories, fat, sodium, and/or sugar. Patient learns recommended strategies on how to positively address this and avoid unhealthy pitfalls.  Facts on Fats  Clinical staff conducted group or individual video education with verbal and written material and guidebook.  Patient learns that lifestyle modifications can be just as effective, if not more so, as many medications for lowering your risk of heart disease. A Pritikin lifestyle can help to reduce your risk of inflammation and atherosclerosis (cholesterol build-up, or plaque, in the artery walls). Lifestyle interventions such as dietary choices and physical activity address the cause of atherosclerosis. A review of the types of fats and their impact on blood cholesterol levels, along with dietary recommendations to reduce fat intake is also included.  Nutrition Action Plan  Clinical staff conducted group or individual video education with verbal and written material and guidebook.  Patient learns how to incorporate Pritikin recommendations into their lifestyle. Recommendations include planning and keeping personal health goals in mind as an important part of their success.  Healthy Mind-Set    Healthy Minds, Bodies, Hearts  Clinical staff conducted group or individual video education with verbal and written material and guidebook.  Patient learns how to identify when they are stressed. Video will discuss the impact of that stress, as well as the many benefits of stress management. Patient will also be introduced to stress management techniques. The way  we think, act, and feel has an impact on our hearts.  How Our Thoughts Can Heal Our Hearts  Clinical staff  conducted group or individual video education with verbal and written material and guidebook.  Patient learns that negative thoughts can cause depression and anxiety. This can result in negative lifestyle behavior and serious health problems. Cognitive behavioral therapy is an effective method to help control our thoughts in order to change and improve our emotional outlook.  Additional Videos:  Exercise    Improving Performance  Clinical staff conducted group or individual video education with verbal and written material and guidebook.  Patient learns to use a non-linear approach by alternating intensity levels and lengths of time spent exercising to help burn more calories and lose more body fat. Cardiovascular exercise helps improve heart health, metabolism, hormonal balance, blood sugar control, and recovery from fatigue. Resistance training improves strength, endurance, balance, coordination, reaction time, metabolism, and muscle mass. Flexibility exercise improves circulation, posture, and balance. Seek guidance from your physician and exercise physiologist before implementing an exercise routine and learn your capabilities and proper form for all exercise.  Introduction to Yoga  Clinical staff conducted group or individual video education with verbal and written material and guidebook.  Patient learns about yoga, a discipline of the coming together of mind, breath, and body. The benefits of yoga include improved flexibility, improved range of motion, better posture and core strength, increased lung function, weight loss, and positive self-image. Yoga's heart health benefits include lowered blood pressure, healthier heart rate, decreased cholesterol and triglyceride levels, improved immune function, and reduced stress. Seek guidance from your physician and exercise physiologist before implementing an exercise routine and learn your capabilities and proper form for all exercise.  Medical   Aging:  Enhancing Your Quality of Life  Clinical staff conducted group or individual video education with verbal and written material and guidebook.  Patient learns key strategies and recommendations to stay in good physical health and enhance quality of life, such as prevention strategies, having an advocate, securing a Health Care Proxy and Power of Attorney, and keeping a list of medications and system for tracking them. It also discusses how to avoid risk for bone loss.  Biology of Weight Control  Clinical staff conducted group or individual video education with verbal and written material and guidebook.  Patient learns that weight gain occurs because we consume more calories than we burn (eating more, moving less). Even if your body weight is normal, you may have higher ratios of fat compared to muscle mass. Too much body fat puts you at increased risk for cardiovascular disease, heart attack, stroke, type 2 diabetes, and obesity-related cancers. In addition to exercise, following the Pritikin Eating Plan can help reduce your risk.  Decoding Lab Results  Clinical staff conducted group or individual video education with verbal and written material and guidebook.  Patient learns that lab test reflects one measurement whose values change over time and are influenced by many factors, including medication, stress, sleep, exercise, food, hydration, pre-existing medical conditions, and more. It is recommended to use the knowledge from this video to become more involved with your lab results and evaluate your numbers to speak with your doctor.   Diseases of Our Time - Overview  Clinical staff conducted group or individual video education with verbal and written material and guidebook.  Patient learns that according to the CDC, 50% to 70% of chronic diseases (such as obesity, type 2 diabetes, elevated lipids, hypertension, and heart  disease) are avoidable through lifestyle improvements including healthier food  choices, listening to satiety cues, and increased physical activity.  Sleep Disorders Clinical staff conducted group or individual video education with verbal and written material and guidebook.  Patient learns how good quality and duration of sleep are important to overall health and well-being. Patient also learns about sleep disorders and how they impact health along with recommendations to address them, including discussing with a physician.  Nutrition  Dining Out - Part 2 Clinical staff conducted group or individual video education with verbal and written material and guidebook.  Patient learns how to plan ahead and communicate in order to maximize their dining experience in a healthy and nutritious manner. Included are recommended food choices based on the type of restaurant the patient is visiting.   Fueling a Banker conducted group or individual video education with verbal and written material and guidebook.  There is a strong connection between our food choices and our health. Diseases like obesity and type 2 diabetes are very prevalent and are in large-part due to lifestyle choices. The Pritikin Eating Plan provides plenty of food and hunger-curbing satisfaction. It is easy to follow, affordable, and helps reduce health risks.  Menu Workshop  Clinical staff conducted group or individual video education with verbal and written material and guidebook.  Patient learns that restaurant meals can sabotage health goals because they are often packed with calories, fat, sodium, and sugar. Recommendations include strategies to plan ahead and to communicate with the manager, chef, or server to help order a healthier meal.  Planning Your Eating Strategy  Clinical staff conducted group or individual video education with verbal and written material and guidebook.  Patient learns about the Pritikin Eating Plan and its benefit of reducing the risk of disease. The Pritikin Eating  Plan does not focus on calories. Instead, it emphasizes high-quality, nutrient-rich foods. By knowing the characteristics of the foods, we choose, we can determine their calorie density and make informed decisions.  Targeting Your Nutrition Priorities  Clinical staff conducted group or individual video education with verbal and written material and guidebook.  Patient learns that lifestyle habits have a tremendous impact on disease risk and progression. This video provides eating and physical activity recommendations based on your personal health goals, such as reducing LDL cholesterol, losing weight, preventing or controlling type 2 diabetes, and reducing high blood pressure.  Vitamins and Minerals  Clinical staff conducted group or individual video education with verbal and written material and guidebook.  Patient learns different ways to obtain key vitamins and minerals, including through a recommended healthy diet. It is important to discuss all supplements you take with your doctor.   Healthy Mind-Set    Smoking Cessation  Clinical staff conducted group or individual video education with verbal and written material and guidebook.  Patient learns that cigarette smoking and tobacco addiction pose a serious health risk which affects millions of people. Stopping smoking will significantly reduce the risk of heart disease, lung disease, and many forms of cancer. Recommended strategies for quitting are covered, including working with your doctor to develop a successful plan.  Culinary   Becoming a Set designer conducted group or individual video education with verbal and written material and guidebook.  Patient learns that cooking at home can be healthy, cost-effective, quick, and puts them in control. Keys to cooking healthy recipes will include looking at your recipe, assessing your equipment needs, planning ahead, making it simple,  choosing cost-effective seasonal ingredients,  and limiting the use of added fats, salts, and sugars.  Cooking - Breakfast and Snacks  Clinical staff conducted group or individual video education with verbal and written material and guidebook.  Patient learns how important breakfast is to satiety and nutrition through the entire day. Recommendations include key foods to eat during breakfast to help stabilize blood sugar levels and to prevent overeating at meals later in the day. Planning ahead is also a key component.  Cooking - Educational psychologist conducted group or individual video education with verbal and written material and guidebook.  Patient learns eating strategies to improve overall health, including an approach to cook more at home. Recommendations include thinking of animal protein as a side on your plate rather than center stage and focusing instead on lower calorie dense options like vegetables, fruits, whole grains, and plant-based proteins, such as beans. Making sauces in large quantities to freeze for later and leaving the skin on your vegetables are also recommended to maximize your experience.  Cooking - Healthy Salads and Dressing Clinical staff conducted group or individual video education with verbal and written material and guidebook.  Patient learns that vegetables, fruits, whole grains, and legumes are the foundations of the Pritikin Eating Plan. Recommendations include how to incorporate each of these in flavorful and healthy salads, and how to create homemade salad dressings. Proper handling of ingredients is also covered. Cooking - Soups and [employer] - Soups and Desserts Clinical staff conducted group or individual video education with verbal and written material and guidebook.  Patient learns that Pritikin soups and desserts make for easy, nutritious, and delicious snacks and meal components that are low in sodium, fat, sugar, and calorie density, while high in vitamins, minerals, and filling  fiber. Recommendations include simple and healthy ideas for soups and desserts.   Overview     The Pritikin Solution Program Overview Clinical staff conducted group or individual video education with verbal and written material and guidebook.  Patient learns that the results of the Pritikin Program have been documented in more than 100 articles published in peer-reviewed journals, and the benefits include reducing risk factors for (and, in some cases, even reversing) high cholesterol, high blood pressure, type 2 diabetes, obesity, and more! An overview of the three key pillars of the Pritikin Program will be covered: eating well, doing regular exercise, and having a healthy mind-set.  WORKSHOPS  Exercise: Exercise Basics: Building Your Action Plan Clinical staff led group instruction and group discussion with PowerPoint presentation and patient guidebook. To enhance the learning environment the use of posters, models and videos may be added. At the conclusion of this workshop, patients will comprehend the difference between physical activity and exercise, as well as the benefits of incorporating both, into their routine. Patients will understand the FITT (Frequency, Intensity, Time, and Type) principle and how to use it to build an exercise action plan. In addition, safety concerns and other considerations for exercise and cardiac rehab will be addressed by the presenter. The purpose of this lesson is to promote a comprehensive and effective weekly exercise routine in order to improve patients' overall level of fitness.   Managing Heart Disease: Your Path to a Healthier Heart Clinical staff led group instruction and group discussion with PowerPoint presentation and patient guidebook. To enhance the learning environment the use of posters, models and videos may be added.At the conclusion of this workshop, patients will understand the anatomy and physiology  of the heart. Additionally, they will  understand how Pritikin's three pillars impact the risk factors, the progression, and the management of heart disease.  The purpose of this lesson is to provide a high-level overview of the heart, heart disease, and how the Pritikin lifestyle positively impacts risk factors.  Exercise Biomechanics Clinical staff led group instruction and group discussion with PowerPoint presentation and patient guidebook. To enhance the learning environment the use of posters, models and videos may be added. Patients will learn how the structural parts of their bodies function and how these functions impact their daily activities, movement, and exercise. Patients will learn how to promote a neutral spine, learn how to manage pain, and identify ways to improve their physical movement in order to promote healthy living. The purpose of this lesson is to expose patients to common physical limitations that impact physical activity. Participants will learn practical ways to adapt and manage aches and pains, and to minimize their effect on regular exercise. Patients will learn how to maintain good posture while sitting, walking, and lifting.  Balance Training and Fall Prevention  Clinical staff led group instruction and group discussion with PowerPoint presentation and patient guidebook. To enhance the learning environment the use of posters, models and videos may be added. At the conclusion of this workshop, patients will understand the importance of their sensorimotor skills (vision, proprioception, and the vestibular system) in maintaining their ability to balance as they age. Patients will apply a variety of balancing exercises that are appropriate for their current level of function. Patients will understand the common causes for poor balance, possible solutions to these problems, and ways to modify their physical environment in order to minimize their fall risk. The purpose of this lesson is to teach patients  about the importance of maintaining balance as they age and ways to minimize their risk of falling.  WORKSHOPS   Nutrition:  Fueling a Ship broker led group instruction and group discussion with PowerPoint presentation and patient guidebook. To enhance the learning environment the use of posters, models and videos may be added. Patients will review the foundational principles of the Pritikin Eating Plan and understand what constitutes a serving size in each of the food groups. Patients will also learn Pritikin-friendly foods that are better choices when away from home and review make-ahead meal and snack options. Calorie density will be reviewed and applied to three nutrition priorities: weight maintenance, weight loss, and weight gain. The purpose of this lesson is to reinforce (in a group setting) the key concepts around what patients are recommended to eat and how to apply these guidelines when away from home by planning and selecting Pritikin-friendly options. Patients will understand how calorie density may be adjusted for different weight management goals.  Mindful Eating  Clinical staff led group instruction and group discussion with PowerPoint presentation and patient guidebook. To enhance the learning environment the use of posters, models and videos may be added. Patients will briefly review the concepts of the Pritikin Eating Plan and the importance of low-calorie dense foods. The concept of mindful eating will be introduced as well as the importance of paying attention to internal hunger signals. Triggers for non-hunger eating and techniques for dealing with triggers will be explored. The purpose of this lesson is to provide patients with the opportunity to review the basic principles of the Pritikin Eating Plan, discuss the value of eating mindfully and how to measure internal cues of hunger and fullness using the Hunger Scale.  Patients will also discuss reasons for non-hunger  eating and learn strategies to use for controlling emotional eating.  Targeting Your Nutrition Priorities Clinical staff led group instruction and group discussion with PowerPoint presentation and patient guidebook. To enhance the learning environment the use of posters, models and videos may be added. Patients will learn how to determine their genetic susceptibility to disease by reviewing their family history. Patients will gain insight into the importance of diet as part of an overall healthy lifestyle in mitigating the impact of genetics and other environmental insults. The purpose of this lesson is to provide patients with the opportunity to assess their personal nutrition priorities by looking at their family history, their own health history and current risk factors. Patients will also be able to discuss ways of prioritizing and modifying the Pritikin Eating Plan for their highest risk areas  Menu  Clinical staff led group instruction and group discussion with PowerPoint presentation and patient guidebook. To enhance the learning environment the use of posters, models and videos may be added. Using menus brought in from [employer], or printed from [employer], patients will apply the Pritikin dining out guidelines that were presented in the [employer] video. Patients will also be able to practice these guidelines in a variety of provided scenarios. The purpose of this lesson is to provide patients with the opportunity to practice hands-on learning of the Pritikin Dining Out guidelines with actual menus and practice scenarios.  Label Reading Clinical staff led group instruction and group discussion with PowerPoint presentation and patient guidebook. To enhance the learning environment the use of posters, models and videos may be added. Patients will review and discuss the Pritikin label reading guidelines presented in Pritikin's Label Reading Educational series video.  Using fool labels brought in from local grocery stores and markets, patients will apply the label reading guidelines and determine if the packaged food meet the Pritikin guidelines. The purpose of this lesson is to provide patients with the opportunity to review, discuss, and practice hands-on learning of the Pritikin Label Reading guidelines with actual packaged food labels. Cooking School  Pritikin's [employer] are designed to teach patients ways to prepare quick, simple, and affordable recipes at home. The importance of nutrition's role in chronic disease risk reduction is reflected in its emphasis in the overall Pritikin program. By learning how to prepare essential core Pritikin Eating Plan recipes, patients will increase control over what they eat; be able to customize the flavor of foods without the use of added salt, sugar, or fat; and improve the quality of the food they consume. By learning a set of core recipes which are easily assembled, quickly prepared, and affordable, patients are more likely to prepare more healthy foods at home. These workshops focus on convenient breakfasts, simple entres, side dishes, and desserts which can be prepared with minimal effort and are consistent with nutrition recommendations for cardiovascular risk reduction. Cooking [employer] are taught by a Armed forces logistics/support/administrative officer (RD) who has been trained by the [employer]. The chef or RD has a clear understanding of the importance of minimizing - if not completely eliminating - added fat, sugar, and sodium in recipes. Throughout the series of Cooking School Workshop sessions, patients will learn about healthy ingredients and efficient methods of cooking to build confidence in their capability to prepare    Cooking School weekly topics:  Adding Flavor- Sodium-Free  Fast and Healthy Breakfasts  Powerhouse Plant-Based Proteins  Satisfying  Salads and Dressings  Simple Sides and  Sauces  International Cuisine-Spotlight on the [employer] Zones  Delicious Desserts  Savory Soups  [employer] - Meals in a Snap  Tasty Appetizers and Snacks  Comforting Weekend Breakfasts  One-Pot Wonders   Fast Evening Meals  Landscape architect Your Pritikin Plate  WORKSHOPS   Healthy Mindset (Psychosocial):  Focused Goals, Sustainable Changes Clinical staff led group instruction and group discussion with PowerPoint presentation and patient guidebook. To enhance the learning environment the use of posters, models and videos may be added. Patients will be able to apply effective goal setting strategies to establish at least one personal goal, and then take consistent, meaningful action toward that goal. They will learn to identify common barriers to achieving personal goals and develop strategies to overcome them. Patients will also gain an understanding of how our mind-set can impact our ability to achieve goals and the importance of cultivating a positive and growth-oriented mind-set. The purpose of this lesson is to provide patients with a deeper understanding of how to set and achieve personal goals, as well as the tools and strategies needed to overcome common obstacles which may arise along the way.  From Head to Heart: The Power of a Healthy Outlook  Clinical staff led group instruction and group discussion with PowerPoint presentation and patient guidebook. To enhance the learning environment the use of posters, models and videos may be added. Patients will be able to recognize and describe the impact of emotions and mood on physical health. They will discover the importance of self-care and explore self-care practices which may work for them. Patients will also learn how to utilize the 4 C's to cultivate a healthier outlook and better manage stress and challenges. The purpose of this lesson is to demonstrate to patients how a healthy outlook is an essential part of  maintaining good health, especially as they continue their cardiac rehab journey.  Healthy Sleep for a Healthy Heart Clinical staff led group instruction and group discussion with PowerPoint presentation and patient guidebook. To enhance the learning environment the use of posters, models and videos may be added. At the conclusion of this workshop, patients will be able to demonstrate knowledge of the importance of sleep to overall health, well-being, and quality of life. They will understand the symptoms of, and treatments for, common sleep disorders. Patients will also be able to identify daytime and nighttime behaviors which impact sleep, and they will be able to apply these tools to help manage sleep-related challenges. The purpose of this lesson is to provide patients with a general overview of sleep and outline the importance of quality sleep. Patients will learn about a few of the most common sleep disorders. Patients will also be introduced to the concept of "sleep hygiene," and discover ways to self-manage certain sleeping problems through simple daily behavior changes. Finally, the workshop will motivate patients by clarifying the links between quality sleep and their goals of heart-healthy living.   Recognizing and Reducing Stress Clinical staff led group instruction and group discussion with PowerPoint presentation and patient guidebook. To enhance the learning environment the use of posters, models and videos may be added. At the conclusion of this workshop, patients will be able to understand the types of stress reactions, differentiate between acute and chronic stress, and recognize the impact that chronic stress has on their health. They will also be able to apply different coping mechanisms, such as reframing negative self-talk. Patients will have the opportunity to  practice a variety of stress management techniques, such as deep abdominal breathing, progressive muscle relaxation, and/or  guided imagery.  The purpose of this lesson is to educate patients on the role of stress in their lives and to provide healthy techniques for coping with it.  Learning Barriers/Preferences:  Learning Barriers/Preferences - 05/17/22 1601       Learning Barriers/Preferences   Learning Barriers Sight;Exercise Concerns;Hearing   Pt is visually impaired, many pain issues, back surgery, assistive device and artritis. [facility]   Learning Preferences Audio;Pictoral;Skilled Demonstration;Verbal Instruction;Video;Written Material;Individual Instruction;Group Instruction;Computer/Internet             Education Topics:  Knowledge Questionnaire Score:  Knowledge Questionnaire Score - 05/17/22 1603       Knowledge Questionnaire Score   Pre Score --   Pt will return on first day of exercise            Core Components/Risk Factors/Patient Goals at Admission:  Personal Goals and Risk Factors at Admission - 05/17/22 1603       Core Components/Risk Factors/Patient Goals on Admission   Hypertension Yes    Intervention Provide education on lifestyle modifcations including regular physical activity/exercise, weight management, moderate sodium restriction and increased consumption of fresh fruit, vegetables, and low fat dairy, alcohol moderation, and smoking cessation.;Monitor prescription use compliance.    Expected Outcomes Short Term: Continued assessment and intervention until BP is < 140/55mm HG in hypertensive participants. < 130/73mm HG in hypertensive participants with diabetes, heart failure or chronic kidney disease.;Long Term: Maintenance of blood pressure at goal levels.    Lipids Yes    Intervention Provide education and support for participant on nutrition & aerobic/resistive exercise along with prescribed medications to achieve LDL 70mg , HDL >40mg .    Expected Outcomes Short Term: Participant states understanding of desired cholesterol values and is compliant with medications prescribed.  Participant is following exercise prescription and nutrition guidelines.;Long Term: Cholesterol controlled with medications as prescribed, with individualized exercise RX and with personalized nutrition plan. Value goals: LDL < 70mg , HDL > 40 mg.    Stress Yes    Intervention Offer individual and/or small group education and counseling on adjustment to heart disease, stress management and health-related lifestyle change. Teach and support self-help strategies.;Refer participants experiencing significant psychosocial distress to appropriate mental health specialists for further evaluation and treatment. When possible, include family members and significant others in education/counseling sessions.    Expected Outcomes Short Term: Participant demonstrates changes in health-related behavior, relaxation and other stress management skills, ability to obtain effective social support, and compliance with psychotropic medications if prescribed.;Long Term: Emotional wellbeing is indicated by absence of clinically significant psychosocial distress or social isolation.    Personal Goal Other Yes    Personal Goal Short term: increase QOL Long term: stamina    Intervention Will continue to monitor pt and progress workloads as tolerated without sign or symptom    Expected Outcomes Pt will achieve his goals             Core Components/Risk Factors/Patient Goals Review:    Core Components/Risk Factors/Patient Goals at Discharge (Final Review):    ITP Comments:  ITP Comments     Row Name 05/17/22 1402           ITP Comments Introduction to [facility]/ Intensive Cardiac Rehab. Initial Orientation Packet Reviewed with the patient and his wife                Comments: Participant attended orientation for the cardiac  rehabilitation program on  05/17/2022  to perform initial intake and exercise walk test. Patient introduced to the [facility] education and orientation packet was  reviewed. Completed 6-minute stepper test due to poor vision, measurements, initial ITP, and exercise prescription. Vital signs stable. Telemetry V paced rhythm, asymptomatic but elevated HR during 6 min stepper test, DEC SPM and HR returned to [facility].  Service time was from 1310 to 1525.

## 2022-05-17 NOTE — Progress Notes (Signed)
Subjective:    Patient ID: [name], male    DOB: [birth year], 89 y.o.   MRN: [id number]      HPI [name] is here for  Chief Complaint  Patient presents with   Foot Swelling    Swelling in both feet    Saw Dr Excell Seltzer 4/29 - was having SOB. Did have some mild leg swelling. Was taking lasix as needed only.( Would take it for 5-6 days and then stop for 1-2 weeks and then repeat).   Lasix changed to 40 mg TIW.  It has not helped.  Weight at home has been stable over the past few days.     Some decrease urination.    Cough - it started last night.  It is a dry cough.  He has had this in the past and sometimes goes away in a couple of days.  Medications and allergies reviewed with patient and updated if appropriate.  Current Outpatient Medications on File Prior to Visit  Medication Sig Dispense Refill   acetaminophen (TYLENOL) 500 MG tablet Take 1,000 mg by mouth 3 (three) times daily as needed for moderate pain.     allopurinol (ZYLOPRIM) 100 MG tablet TAKE 1 TABLET(100 MG) BY MOUTH DAILY (Patient taking differently: Take 100 mg by mouth daily. TAKE 1 TABLET(100 MG) BY MOUTH DAILY) 30 tablet 0   amLODipine (NORVASC) 5 MG tablet Take 1 tablet (5 mg total) by mouth daily. 90 tablet 1   amoxicillin (AMOXIL) 500 MG capsule Take 2,000 mg by mouth as directed. 1 HOUR PRIOR TO DENTAL CLEANINGS AND PROCEDURES     Aromatic Inhalants (VICKS VAPOR INHALER IN) Place 1 puff into both nostrils as needed (for congestion).     Ascorbic Acid (VITAMIN C) 1000 MG tablet Take 1,000 mg by mouth daily.     aspirin EC 81 MG tablet Take 81 mg by mouth daily.     Calcium Citrate-Vitamin D (CITRACAL + D PO) Take 2 tablets by mouth in the morning and at bedtime.     Carboxymeth-Glycerin-Polysorb (REFRESH OPTIVE MEGA-3 OP) Place 1 drop into both eyes 2 (two) times daily.     carvedilol (COREG) 3.125 MG tablet Take 1 tablet (3.125 mg total) by mouth 2 (two) times daily. 180 tablet 3   Cholecalciferol 25  MCG (1000 UT) capsule Take 1,000 Units by mouth daily.     cyanocobalamin (,VITAMIN B-12,) 1000 MCG/ML injection Inject 1,000 mcg into the muscle once. Monthly injection     folic acid (FOLVITE) 1 MG tablet Take 1 tablet (1 mg total) by mouth daily. Annual appt due in May must see provider for future refills (Patient taking differently: Take 1 mg by mouth at bedtime. Annual appt due in May must see provider for future refills) 90 tablet 2   furosemide (LASIX) 40 MG tablet Take 1 tablet by mouth every Monday, Wednesday, and Friday 36 tablet 3   hydrALAZINE (APRESOLINE) 100 MG tablet Take 1 tablet (100 mg total) by mouth 3 (three) times daily. 90 tablet 5   loratadine (CLARITIN) 10 MG tablet Take 10 mg by mouth daily as needed (for seasonal allergies).     nitroGLYCERIN (NITROSTAT) 0.4 MG SL tablet DISSOLVE 1 TABLET UNDER THE TONGUE EVERY 5 MINUTES AS NEEDED FOR CHEST PAIN, MAXIMUM 3 TABLETS 100 tablet 1   Saline (AYR NASAL MIST ALLERGY/SINUS NA) Place 2 sprays into the nose as needed (dryness).     trolamine salicylate (ASPERCREME) 10 % cream Apply 1 Application  topically in the morning and at bedtime. For leg cramps     Vitamin D, Ergocalciferol, (DRISDOL) 1.25 MG (50000 UNIT) CAPS capsule Take 50,000 Units by mouth every 30 (thirty) days.     Current Facility-Administered Medications on File Prior to Visit  Medication Dose Route Frequency Provider Last Rate Last Admin   NON FORMULARY 1 application  1 application  Topical PRN Asencion Islam, DPM        Review of Systems  Constitutional:  Negative for appetite change and fever.  HENT:  Negative for congestion and sore throat.   Respiratory:  Positive for cough and shortness of breath. Negative for chest tightness and wheezing.   Cardiovascular:  Positive for leg swelling. Negative for chest pain and palpitations.  Gastrointestinal:  Positive for abdominal pain (occ sharp pain in abdomen). Negative for constipation and diarrhea.  Genitourinary:   Positive for decreased urine volume. Negative for dysuria and hematuria.  Neurological:  Negative for light-headedness and headaches.       Objective:   Vitals:   05/18/22 1335  BP: 130/76  Pulse: 80  Temp: 98.6 F (37 C)  SpO2: 98%   BP Readings from Last 3 Encounters:  05/18/22 130/76  05/17/22 132/74  05/07/22 (!) 150/80   Wt Readings from Last 3 Encounters:  05/18/22 159 lb (72.1 kg)  05/17/22 160 lb 0.9 oz (72.6 kg)  05/07/22 157 lb 3.2 oz (71.3 kg)   Body mass index is 24.54 kg/m.    Physical Exam Constitutional:      General: He is not in acute distress.    Appearance: Normal appearance. He is not ill-appearing.  HENT:     Head: Normocephalic and atraumatic.  Eyes:     Conjunctiva/sclera: Conjunctivae normal.  Cardiovascular:     Rate and Rhythm: Normal rate and regular rhythm.     Heart sounds: Murmur heard.  Pulmonary:     Effort: Pulmonary effort is normal. No respiratory distress.     Breath sounds: Normal breath sounds. No wheezing or rales.  Musculoskeletal:     Right lower leg: Edema present.     Left lower leg: Edema present.  Skin:    General: Skin is warm and dry.     Findings: No rash.  Neurological:     Mental Status: He is alert. Mental status is at baseline.  Psychiatric:        Mood and Affect: Mood normal.            Assessment & Plan:    See Problem List for Assessment and Plan of chronic medical problems.

## 2022-05-18 ENCOUNTER — Ambulatory Visit (INDEPENDENT_AMBULATORY_CARE_PROVIDER_SITE_OTHER): Payer: Medicare Other | Admitting: Internal Medicine

## 2022-05-18 ENCOUNTER — Ambulatory Visit: Payer: Medicare Other

## 2022-05-18 VITALS — BP 130/76 | HR 80 | Temp 98.6°F | Ht 67.5 in | Wt 159.0 lb

## 2022-05-18 DIAGNOSIS — I872 Venous insufficiency (chronic) (peripheral): Secondary | ICD-10-CM | POA: Diagnosis not present

## 2022-05-18 DIAGNOSIS — E538 Deficiency of other specified B group vitamins: Secondary | ICD-10-CM | POA: Diagnosis not present

## 2022-05-18 DIAGNOSIS — I10 Essential (primary) hypertension: Secondary | ICD-10-CM | POA: Diagnosis not present

## 2022-05-18 DIAGNOSIS — R051 Acute cough: Secondary | ICD-10-CM

## 2022-05-18 LAB — BASIC METABOLIC PANEL
BUN: 60 mg/dL — ABNORMAL HIGH (ref 6–23)
CO2: 24 mEq/L (ref 19–32)
Calcium: 9.6 mg/dL (ref 8.4–10.5)
Chloride: 103 mEq/L (ref 96–112)
Creatinine, Ser: 2.64 mg/dL — ABNORMAL HIGH (ref 0.40–1.50)
GFR: 20.19 mL/min — ABNORMAL LOW (ref >60.00)
Glucose, Bld: 90 mg/dL (ref 70–99)
Potassium: 4 mEq/L (ref 3.5–5.1)
Sodium: 137 mEq/L (ref 135–145)

## 2022-05-18 MED ORDER — CYANOCOBALAMIN 1000 MCG/ML IJ SOLN
1000.0000 ug | Freq: Once | INTRAMUSCULAR | Status: AC
Start: 2022-05-18 — End: 2022-05-18
  Administered 2022-05-18: 1000 ug via INTRAMUSCULAR

## 2022-05-18 MED ORDER — HYDROCODONE BIT-HOMATROP MBR 5-1.5 MG/5ML PO SOLN: 5.0000 mL | Freq: Three times a day (TID) | ORAL | 0 refills | Status: DC | PRN

## 2022-05-18 NOTE — Assessment & Plan Note (Signed)
Acute Started last night Resulted in very poor sleep At this point I am not sure if this is an infection-viral or bacterial versus other Will prescribe Hycodan cough syrup for nighttime so that he is able to sleep

## 2022-05-18 NOTE — Assessment & Plan Note (Signed)
Chronic Leg edema secondary to venous insufficiency and some diastolic heart failure Increased edema Currently taking Lasix 40 mg 3 times a week BMP today Increase Lasix to daily for 4-5 days and then 3 times a week-hopefully this will reduce some of the swelling and then maintain it Sees kidney doctor in a couple of weeks

## 2022-05-18 NOTE — Assessment & Plan Note (Signed)
Chronic Blood pressure well-controlled Continue hydralazine 100 mg 3 times daily, Coreg 3.125 mg twice daily BMP

## 2022-05-18 NOTE — Assessment & Plan Note (Signed)
Chronic Continue B12 injections monthly Injection today

## 2022-05-21 ENCOUNTER — Telehealth: Payer: Self-pay | Admitting: Internal Medicine

## 2022-05-21 NOTE — Telephone Encounter (Signed)
FYI:  Patient called back and kidney doctor is :  Dr. Marisue Humble

## 2022-05-22 ENCOUNTER — Ambulatory Visit (INDEPENDENT_AMBULATORY_CARE_PROVIDER_SITE_OTHER): Payer: Medicare Other | Admitting: Neurology

## 2022-05-22 ENCOUNTER — Encounter: Payer: Self-pay | Admitting: Neurology

## 2022-05-22 VITALS — BP 127/65 | HR 72 | Ht 70.0 in | Wt 151.0 lb

## 2022-05-22 DIAGNOSIS — R269 Unspecified abnormalities of gait and mobility: Secondary | ICD-10-CM | POA: Diagnosis not present

## 2022-05-22 DIAGNOSIS — F0392 Unspecified dementia, unspecified severity, with psychotic disturbance: Secondary | ICD-10-CM

## 2022-05-22 NOTE — Telephone Encounter (Signed)
noted 
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
## 2022-05-23 ENCOUNTER — Ambulatory Visit (HOSPITAL_COMMUNITY)
Admission: RE | Admit: 2022-05-23 | Discharge: 2022-05-23 | Disposition: A | Discharge status: Home / Self Care | Payer: Medicare Other | Source: Ambulatory Visit | Attending: Nephrology | Admitting: Nephrology

## 2022-05-23 ENCOUNTER — Encounter: Payer: Medicare Other | Admitting: Cardiovascular Disease

## 2022-05-23 ENCOUNTER — Encounter (HOSPITAL_COMMUNITY): Payer: Medicare Other

## 2022-05-23 VITALS — BP 100/62 | HR 75 | Temp 97.3°F | Resp 17

## 2022-05-23 DIAGNOSIS — N183 Chronic kidney disease, stage 3 unspecified: Secondary | ICD-10-CM | POA: Diagnosis not present

## 2022-05-23 LAB — IRON AND TIBC
Iron: 49 ug/dL (ref 45–182)
Saturation Ratios: 18 % (ref 17.9–39.5)
TIBC: 267 ug/dL (ref 250–450)
UIBC: 218 ug/dL

## 2022-05-23 LAB — RENAL FUNCTION PANEL
Albumin: 3.8 g/dL (ref 3.5–5.0)
Anion gap: 12 (ref 5–15)
BUN: 64 mg/dL — ABNORMAL HIGH (ref 8–23)
CO2: 22 mmol/L (ref 22–32)
Calcium: 9.4 mg/dL (ref 8.9–10.3)
Chloride: 102 mmol/L (ref 98–111)
Creatinine, Ser: 2.65 mg/dL — ABNORMAL HIGH (ref 0.61–1.24)
GFR, Estimated: 22 mL/min — ABNORMAL LOW (ref >60)
Glucose, Bld: 88 mg/dL (ref 70–99)
Phosphorus: 4 mg/dL (ref 2.5–4.6)
Potassium: 3.6 mmol/L (ref 3.5–5.1)
Sodium: 136 mmol/L (ref 135–145)

## 2022-05-23 LAB — POCT HEMOGLOBIN-HEMACUE: Hemoglobin: 10.6 g/dL — ABNORMAL LOW (ref 13.0–17.0)

## 2022-05-23 MED ORDER — EPOETIN ALFA-EPBX 40000 UNIT/ML IJ SOLN
INTRAMUSCULAR | Status: AC
  Administered 2022-05-23: 20000 [IU] via SUBCUTANEOUS
  Filled 2022-05-23: qty 1

## 2022-05-23 MED ORDER — EPOETIN ALFA-EPBX 10000 UNIT/ML IJ SOLN: 20000.0000 [IU] | INTRAMUSCULAR | Status: DC

## 2022-05-23 NOTE — Progress Notes (Signed)
Remote pacemaker transmission.   

## 2022-05-25 ENCOUNTER — Encounter (HOSPITAL_COMMUNITY): Payer: Medicare Other

## 2022-05-28 ENCOUNTER — Encounter: Payer: Self-pay | Admitting: Cardiovascular Disease

## 2022-05-28 ENCOUNTER — Telehealth: Payer: Self-pay | Admitting: Internal Medicine

## 2022-05-28 ENCOUNTER — Ambulatory Visit: Payer: Medicare Other | Attending: Cardiovascular Disease | Admitting: Cardiovascular Disease

## 2022-05-28 VITALS — BP 124/60 | HR 80 | Ht 70.0 in | Wt 149.4 lb

## 2022-05-28 DIAGNOSIS — I441 Atrioventricular block, second degree: Secondary | ICD-10-CM | POA: Diagnosis present

## 2022-05-28 LAB — CUP PACEART INCLINIC DEVICE CHECK
Battery Impedance: 1503 Ohm
Battery Remaining Longevity: 47 mo
Battery Voltage: 2.76 V
Brady Statistic AP VP Percent: 2 %
Brady Statistic AP VS Percent: 55 %
Brady Statistic AS VP Percent: 0 %
Brady Statistic AS VS Percent: 43 %
Date Time Interrogation Session: 20240520142743
Implantable Lead Connection Status: 753985
Implantable Lead Connection Status: 753985
Implantable Lead Implant Date: 20140321
Implantable Lead Implant Date: 20140321
Implantable Lead Location: 753859
Implantable Lead Location: 753860
Implantable Lead Model: 5076
Implantable Lead Model: 5092
Implantable Pulse Generator Implant Date: 20140321
Lead Channel Impedance Value: 400 Ohm
Lead Channel Impedance Value: 623 Ohm
Lead Channel Pacing Threshold Amplitude: 0.625 V
Lead Channel Pacing Threshold Amplitude: 1 V
Lead Channel Pacing Threshold Pulse Width: 0.4 ms
Lead Channel Pacing Threshold Pulse Width: 0.4 ms
Lead Channel Sensing Intrinsic Amplitude: 15.67 mV
Lead Channel Sensing Intrinsic Amplitude: 2.8 mV
Lead Channel Setting Pacing Amplitude: 2 V
Lead Channel Setting Pacing Amplitude: 2.5 V
Lead Channel Setting Pacing Pulse Width: 0.4 ms
Lead Channel Setting Sensing Sensitivity: 5.6 mV
Zone Setting Status: 755011
Zone Setting Status: 755011

## 2022-05-28 MED ORDER — HYDROCODONE BIT-HOMATROP MBR 5-1.5 MG/5ML PO SOLN: 5.0000 mL | Freq: Three times a day (TID) | ORAL | 0 refills | Status: DC | PRN

## 2022-05-28 NOTE — Telephone Encounter (Signed)
Prescription Request  05/28/2022  LOV: 05/18/2022  What is the name of the medication or equipment? HYDROcodone bit-homatropine (HYCODAN) 5-1.5 MG/5ML syrup   Have you contacted your pharmacy to request a refill? No   Which pharmacy would you like this sent to?  [id number] - [place], [place] - [address] AT [facility] [address] [place] [address] Phone: [phone] Fax: [phone]    Patient notified that their request is being sent to the clinical staff for review and that they should receive a response within 2 business days.   Please advise at Mobile [phone] (mobile)

## 2022-05-28 NOTE — Progress Notes (Signed)
Electrophysiology Office Note:    Date:  05/28/2022   ID:  [name], DOB 10-09-1926, MRN [id number]  PCP:  Pincus Sanes, MD   [facility] Providers Cardiologist:  Tonny Bollman, MD     Referring MD: Pincus Sanes, MD   History of Present Illness:    [name] is a 89 y.o. male with a hx listed below, significant for second-degree AV block, status postplacement of Medtronic pacemaker in 2014 who presents for device and arrhythmia follow-up.  He has a history of recurrent epistaxis, requiring ER visits in the past.  Since his last clinic visit, he has had two episodes of atrial fibrillation (2/16 and 3/2). These lasted between 3-4 hours. He was unaware of any heart rhythm abnormality. Reviewing prior interrogations shows that he has had atrial high rate episodes in the past -- these have been of short duration.  Past Medical History:  Diagnosis Date   Anemia    AV block, 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 S/P stenting to mid RCA, prox PDA 06/1999. b. NSTEMI/CABG x 3 in 10/2011 with LIMA to LAD, SVG to PDA, and SVG to OM1.    Cephalalgia 07/14/2015   CHB (complete heart block) 05/03/2012   Overview:  Status post complete heart block heart rate 28 bpm, alternating with 2 to one AV block and drug for bradycardia.  Status post pacemaker implantation.status post Medtronic pacemaker implantation the 03/28/2012   Chronic diastolic CHF (congestive heart failure) 02/05/2018   Chronic gouty arthritis    Chronic UTI    a. Followed by Dr. [name] Caprice - colonized/asymptomatic - not on abx   CKD (chronic kidney disease)    stage 3, GFR 30-59 ml/min; stable with a creatinine around 1.9-2.0 followed by nephrology.   Constipation 03/01/2015   Symptoms and exam consistent with constipation. Abdominal exam is benign with no evidence of pain or obstruction. Discussed importance of  increasing fiber and water intake coupled with physical activity to assist with bowel movements. Continue over-the-counter medication management as needed. Follow-up if symptoms worsen or fail to improve.   Coronary atherosclerosis 11/27/2011   Overview:  Multivessel coronary artery disease recently diagnosed by catheterization 2013 History of stent placement with a heparin-coated stent 2001  Last Assessment & Plan:  Status post coronary bypass grafting.  No recurrent chest pain. Overview:  Overview:  S/P stenting to mid RCA, prox PDA 06/1999;  06/2007 Myoview: negative except for apical thinning, EF 68%, last Myoview August 10, 2008 with n   Degeneration of lumbar intervertebral disc    Degenerative disc disease, cervical, with radiculopathy 09/21/2008   Diastolic dysfunction 07/04/2017   Grade 1 DD on Echo 06/2017   Dyslipidemia    Dysphagia 02/06/2016   3/18 - DG Esophagus:  1. Mild esophageal dysmotility, likely presbyesophagus. 2. No other explanation for patient's symptoms. 3. Small hiatal hernia.  On swallow eval - evidence of cervical spine disease and that was likely contributing   Epistaxis 12/08/2020   Essential hypertension 07/20/2006   GERD (gastroesophageal reflux disease)    Gout 05/12/2018   Gouty arthritis of right great toe 09/03/2017   Left toe Injected January 31, 2018    Hyperpigmentation 11/04/2017   Internal hemorrhoids 08/15/2016   Left inguinal hernia 07/01/2019   Lumbar post-laminectomy syndrome 12/12/2017   Moderate aortic regurgitation 07/04/2017   Echo 06/2017:  EF 55-60%, mild LVH, grade  1 DD, mild AS, mod AR, mild MR, mild-mod TR   Neck injury    a. 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  This was switched to by mouth amiodarone at the time of the thoracic surgery appointment was discontinued.   Presence of aortocoronary bypass graft 10/24/2011   Overview:  Performed at [facility] 2013 Last Assessment & Plan:  No complication post coronary bypass grafting.   Prostate cancer (HCC)    a. 2001 s/p TURP.   Pulmonary nodule    a. felt to be noncancerous.  Status post followup CT scan 4 mm and stable.   Renal artery stenosis    a. 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 Excell Seltzer & Dr. 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   Trochanteric bursitis of hip, bilateral 01/10/2017   UGIB (upper gastrointestinal bleed) 02/01/2018   EGD  02/06/18 - mod, non-erosive gastritis   Venous insufficiency of leg 06/05/2010   Vitamin B12 deficiency 08/23/2009   Jan '14  July '14 B12 level  >1500    472   Weakness of both lower extremities 04/09/2020    Past Surgical History:  Procedure Laterality Date   ANTERIOR CHAMBER WASHOUT Left 10/04/2018   Procedure: Anterior Chamber Washout, Vitreous Tap;  Surgeon: Carmela Rima, MD;  Location: [facility] OR;  Service: Ophthalmology;  Laterality: Left;   cardia catherization  07/07/1999   CARDIAC CATHETERIZATION     CARDIAC SURGERY  10/18/2012   open heart surgery   CATARACT EXTRACTION W/ INTRAOCULAR LENS  IMPLANT, BILATERAL  3/205, 06/2013   mccuen   COLONOSCOPY  04/12/2007   CORONARY ARTERY BYPASS GRAFT  10/19/2011   Procedure: CORONARY ARTERY BYPASS GRAFTING (CABG);  Surgeon: Alleen Borne, MD;  Location: [facility] OR;  Service: Open Heart  Surgery;  Laterality: N/A;  times three using Left Internal Mammary Artery and Right Greater Saphenouse Vein Graft harvested Endoscopically   edg  07/17/1994   FLEXIBLE SIGMOIDOSCOPY  11/03/1997   GAS INSERTION Left 10/04/2018   Procedure: Insertion Of Gas;  Surgeon: Carmela Rima, MD;  Location: [facility] OR;  Service: Ophthalmology;  Laterality: Left;   GAS/FLUID EXCHANGE Left 10/04/2018   Procedure: Gas/Fluid Exchange;  Surgeon: Carmela Rima, MD;  Location: [facility] OR;  Service: Ophthalmology;  Laterality: Left;   INTRAOPERATIVE TRANSTHORACIC ECHOCARDIOGRAM N/A 02/06/2022   Procedure: INTRAOPERATIVE TRANSTHORACIC ECHOCARDIOGRAM;  Surgeon: Tonny Bollman, MD;  Location: [facility] INVASIVE CV LAB;  Service: Open Heart Surgery;  Laterality: N/A;   LEFT HEART CATHETERIZATION WITH CORONARY ANGIOGRAM N/A 10/16/2011   Procedure: LEFT HEART CATHETERIZATION WITH CORONARY ANGIOGRAM;  Surgeon: Peter M Swaziland, MD;  Location: [facility] CATH LAB;  Service: Cardiovascular;  Laterality: N/A;   LEFT HEART CATHETERIZATION WITH CORONARY/GRAFT ANGIOGRAM N/A 03/11/2013   Procedure: LEFT HEART CATHETERIZATION WITH [name] Caprice;  Surgeon: Micheline Chapman, MD;  Location:  [facility] CATH LAB;  Service: Cardiovascular;  Laterality: N/A;   lumbar spinal disk and neck fusion surgery     PACEMAKER INSERTION  03/28/2012   PPM implanted for mobitz II AV block   PARS PLANA VITRECTOMY Left 10/04/2018   Procedure: PARS PLANA VITRECTOMY 25 GAUGE FOR ENDOPHTHALMITIS WITH INJECTION OF INTRAVITREAL ANTIBIOTIC;  Surgeon: Carmela Rima, MD;  Location: [facility] OR;  Service: Ophthalmology;  Laterality: Left;   peripheral vascular catherization  11/24/2003   PERMANENT PACEMAKER INSERTION N/A 03/28/2012   Procedure: PERMANENT PACEMAKER INSERTION;  Surgeon: Hillis Range, MD;  Location: [facility] CATH LAB;  Service: Cardiovascular;  Laterality: N/A;   PROSTATECTOMY     renal circulation  10/01/2003   RIGHT HEART CATH N/A 03/02/2022   Procedure: RIGHT HEART  CATH;  Surgeon: Runell Gess, MD;  Location: [facility] INVASIVE CV LAB;  Service: Cardiovascular;  Laterality: N/A;   s/p ptca     stents     X 2   stress cardiolite  05/04/2005   spring 09-negative except for apical thinning, EF 68%   TRANSCATHETER AORTIC VALVE REPLACEMENT, TRANSFEMORAL Right 02/06/2022   Procedure: Transcatheter Aortic Valve Replacement, Transfemoral;  Surgeon: Tonny Bollman, MD;  Location: [facility] INVASIVE CV LAB;  Service: Open Heart Surgery;  Laterality: Right;    Current Medications: Current Meds  Medication Sig   acetaminophen (TYLENOL) 500 MG tablet Take 1,000 mg by mouth 3 (three) times daily as needed for moderate pain.   allopurinol (ZYLOPRIM) 100 MG tablet TAKE 1 TABLET(100 MG) BY MOUTH DAILY (Patient taking differently: Take 100 mg by mouth daily. TAKE 1 TABLET(100 MG) BY MOUTH DAILY)   amLODipine (NORVASC) 5 MG tablet Take 1 tablet (5 mg total) by mouth daily.   amoxicillin (AMOXIL) 500 MG capsule Take 2,000 mg by mouth as directed. 1 HOUR PRIOR TO DENTAL CLEANINGS AND PROCEDURES   Aromatic Inhalants (VICKS VAPOR INHALER IN) Place 1 puff into both nostrils as needed (for congestion).   Ascorbic Acid (VITAMIN C) 1000 MG tablet Take 1,000 mg by mouth daily.   aspirin EC 81 MG tablet Take 81 mg by mouth daily.   Calcium Citrate-Vitamin D (CITRACAL + D PO) Take 2 tablets by mouth in the morning and at bedtime.   Carboxymeth-Glycerin-Polysorb (REFRESH OPTIVE MEGA-3 OP) Place 1 drop into both eyes 2 (two) times daily.   carvedilol (COREG) 3.125 MG tablet Take 1 tablet (3.125 mg total) by mouth 2 (two) times daily.   Cholecalciferol 25 MCG (1000 UT) capsule Take 1,000 Units by mouth daily.   cyanocobalamin (,VITAMIN B-12,) 1000 MCG/ML injection Inject 1,000 mcg into the muscle once. Monthly injection   folic acid (FOLVITE) 1 MG tablet Take 1 tablet (1 mg total) by mouth daily. Annual appt due in May must see provider for future refills (Patient taking differently: Take 1  mg by mouth at bedtime. Annual appt due in May must see provider for future refills)   furosemide (LASIX) 40 MG tablet Take 1 tablet by mouth every Monday, Wednesday, and Friday   hydrALAZINE (APRESOLINE) 100 MG tablet Take 1 tablet (100 mg total) by mouth 3 (three) times daily.   HYDROcodone bit-homatropine (HYCODAN) 5-1.5 MG/5ML syrup Take 5 mLs by mouth every 8 (eight) hours as needed for cough.   loratadine (CLARITIN) 10 MG tablet Take 10 mg by mouth daily as needed (for seasonal allergies).   nitroGLYCERIN (NITROSTAT) 0.4 MG SL tablet DISSOLVE 1 TABLET UNDER THE TONGUE EVERY 5 MINUTES AS NEEDED FOR CHEST PAIN, MAXIMUM 3 TABLETS  Saline (AYR NASAL MIST ALLERGY/SINUS NA) Place 2 sprays into the nose as needed (dryness).   trolamine salicylate (ASPERCREME) 10 % cream Apply 1 Application topically in the morning and at bedtime. For leg cramps   Vitamin D, Ergocalciferol, (DRISDOL) 1.25 MG (50000 UNIT) CAPS capsule Take 50,000 Units by mouth every 30 (thirty) days.   Current Facility-Administered Medications for the 05/28/22 encounter (Office Visit) with Brandin Dilday, Roberts Gaudy, MD  Medication   NON FORMULARY 1 application     Allergies:   Aspirin, Lisinopril, Amoxicillin, Atarax [hydroxyzine hcl], Cephalexin, Ciprofloxacin, Clindamycin, Clobetasol, Codeine, Fish allergy, Fluarix [influenza virus vaccine], Haemophilus influenzae, Hydrocodone, Hydrocodone-acetaminophen, Hydroxyzine, Latex, Macrobid [nitrofurantoin macrocrystal], Niacin, Niacin-lovastatin er, Niacin-lovastatin er, Nitrofurantoin, Omeprazole, Other, Tramadol, Vibramycin [doxycycline], Adhesive [tape], Bactrim [sulfamethoxazole-trimethoprim], Colchicine, Gabapentin, and Nortriptyline   Social and Family History: Reviewed in Epic  ROS:   Please see the history of present illness.    All other systems reviewed and are negative.  EKGs/Labs/Other Studies Reviewed Today:    Echocardiogram:  TTE 03/14/2022 EF 60-65%, s/p TAVR 02/06/22;  mild-moderate MR. Atria are normal in size.   Monitors:   Stress testing:    Advanced imaging:  CT coronary 01/11/2022 S/p CABG with LIMA-LAD; SVG-PDA and SVG-PM1 are occluded.  Cardiac catherization   EKG:  Last EKG results: today: A-V paced with prolonged AV delay, RBBB LAFB   Recent Labs: 02/27/2022: B Natriuretic Peptide 490.4 02/28/2022: Magnesium 2.4 04/03/2022: TSH 1.940 04/05/2022: ALT 14; Platelets 321 05/23/2022: BUN 64; Creatinine, Ser 2.65; Hemoglobin 10.6; Potassium 3.6; Sodium 136     Physical Exam:    VS:  BP 124/60   Pulse 80   Ht 5\' 10"  (1.778 m)   Wt 149 lb 6.4 oz (67.8 kg)   SpO2 98%   BMI 21.44 kg/m     Wt Readings from Last 3 Encounters:  05/28/22 149 lb 6.4 oz (67.8 kg)  05/22/22 151 lb (68.5 kg)  05/18/22 159 lb (72.1 kg)     GEN: Well nourished, well developed in no acute distress CARDIAC: RRR, no murmurs, rubs, gallops RESPIRATORY:  Normal work of breathing MUSCULOSKELETAL: no edema    ASSESSMENT & PLAN:    History of second degree AV block Medtronic dual chamber in place, functioning normally Prolonged AV delay -- MVP on. Low V-pacing burden I reviewed the interrogation in detail today. See PaceArt   Atrial fibrillation He has had just two episodes of prolonged AF; overall burden remains < 0.1% With history of severe epistaxis, risk of bleeding outweighs benefit I'm hoping that his AF subsides as he recovers from the TAVR. If he continues to have AF, he may require LAA occlusion -- he has advanced age and is frail though he did tolerate his TAVR earlier this year  CAD s/p CABG Continue ASA 81, carvedilol 3.125  S/p TAVR           Medication Adjustments/Labs and Tests Ordered: Current medicines are reviewed at length with the patient today.  Concerns regarding medicines are outlined above.  No orders of the defined types were placed in this encounter.  No orders of the defined types were placed in this  encounter.    Signed, Maurice Small, MD  05/28/2022 2:11 PM    [facility]

## 2022-05-28 NOTE — Patient Instructions (Signed)
Medication Instructions:  Your physician recommends that you continue on your current medications as directed. Please refer to the Current Medication list given to you today. *If you need a refill on your cardiac medications before your next appointment, please call your pharmacy*   Follow-Up: At [facility], you and your health needs are our priority.  As part of our continuing mission to provide you with exceptional heart care, we have created designated Provider Care Teams.  These Care Teams include your primary Cardiologist (physician) and Advanced Practice Providers (APPs -  Physician Assistants and Nurse Practitioners) who all work together to provide you with the care you need, when you need it.  We recommend signing up for the patient portal called "MyChart".  Sign up information is provided on this After Visit Summary.  MyChart is used to connect with patients for Virtual Visits (Telemedicine).  Patients are able to view lab/test results, encounter notes, upcoming appointments, etc.  Non-urgent messages can be sent to your provider as well.   To learn more about what you can do with MyChart, go to [email or web address].    Your next appointment:   3-4 month(s)  Provider:   York Pellant, MD

## 2022-05-29 ENCOUNTER — Telehealth: Payer: Self-pay | Admitting: Internal Medicine

## 2022-05-29 MED ORDER — AZITHROMYCIN 250 MG PO TABS: ORAL_TABLET | ORAL | 0 refills | Status: DC

## 2022-05-29 NOTE — Telephone Encounter (Signed)
Pt is still coughing up brown mucus and pt wanted to know if he need be seen or can Dr. Lawerance Bach prescribe something since he has been seen for this same issues 2 weeks ago.

## 2022-05-29 NOTE — Telephone Encounter (Signed)
Z pak sent to pharmacy

## 2022-05-30 ENCOUNTER — Encounter (HOSPITAL_COMMUNITY): Payer: Medicare Other

## 2022-05-30 NOTE — Telephone Encounter (Signed)
Spoke with patient today. 

## 2022-06-01 ENCOUNTER — Emergency Department (HOSPITAL_COMMUNITY)
Admission: EM | Admit: 2022-06-01 | Discharge: 2022-06-02 | Disposition: A | Discharge status: Home / Self Care | Payer: Medicare Other | Attending: Emergency Medicine | Admitting: Emergency Medicine

## 2022-06-01 ENCOUNTER — Encounter (HOSPITAL_COMMUNITY): Payer: Medicare Other

## 2022-06-01 DIAGNOSIS — R079 Chest pain, unspecified: Secondary | ICD-10-CM

## 2022-06-01 DIAGNOSIS — Z950 Presence of cardiac pacemaker: Secondary | ICD-10-CM | POA: Diagnosis not present

## 2022-06-01 DIAGNOSIS — Z91040 Latex allergy status: Secondary | ICD-10-CM | POA: Diagnosis not present

## 2022-06-01 DIAGNOSIS — Z7982 Long term (current) use of aspirin: Secondary | ICD-10-CM | POA: Insufficient documentation

## 2022-06-01 DIAGNOSIS — Z79899 Other long term (current) drug therapy: Secondary | ICD-10-CM | POA: Insufficient documentation

## 2022-06-01 DIAGNOSIS — J189 Pneumonia, unspecified organism: Secondary | ICD-10-CM

## 2022-06-01 DIAGNOSIS — J181 Lobar pneumonia, unspecified organism: Secondary | ICD-10-CM | POA: Insufficient documentation

## 2022-06-02 ENCOUNTER — Emergency Department (HOSPITAL_COMMUNITY): Payer: Medicare Other

## 2022-06-02 ENCOUNTER — Other Ambulatory Visit: Payer: Self-pay

## 2022-06-02 DIAGNOSIS — J181 Lobar pneumonia, unspecified organism: Secondary | ICD-10-CM | POA: Diagnosis not present

## 2022-06-02 LAB — TROPONIN I (HIGH SENSITIVITY)
Troponin I (High Sensitivity): 26 ng/L — ABNORMAL HIGH (ref <18)
Troponin I (High Sensitivity): 25 ng/L — ABNORMAL HIGH (ref <18)
Troponin I (High Sensitivity): 21 ng/L — ABNORMAL HIGH (ref <18)

## 2022-06-02 LAB — CBC WITH DIFFERENTIAL/PLATELET
Abs Immature Granulocytes: 0.08 10*3/uL — ABNORMAL HIGH (ref 0.00–0.07)
Basophils Absolute: 0 10*3/uL (ref 0.0–0.1)
Basophils Relative: 0 %
Eosinophils Absolute: 0 10*3/uL (ref 0.0–0.5)
Eosinophils Relative: 0 %
HCT: 29.1 % — ABNORMAL LOW (ref 39.0–52.0)
Hemoglobin: 9.4 g/dL — ABNORMAL LOW (ref 13.0–17.0)
Immature Granulocytes: 1 %
Lymphocytes Relative: 3 %
Lymphs Abs: 0.5 10*3/uL — ABNORMAL LOW (ref 0.7–4.0)
MCH: 25 pg — ABNORMAL LOW (ref 26.0–34.0)
MCHC: 32.3 g/dL (ref 30.0–36.0)
MCV: 77.4 fL — ABNORMAL LOW (ref 80.0–100.0)
Monocytes Absolute: 0.4 10*3/uL (ref 0.1–1.0)
Monocytes Relative: 3 %
Neutro Abs: 13.6 10*3/uL — ABNORMAL HIGH (ref 1.7–7.7)
Neutrophils Relative %: 93 %
Platelets: 260 10*3/uL (ref 150–400)
RBC: 3.76 MIL/uL — ABNORMAL LOW (ref 4.22–5.81)
RDW: 16.7 % — ABNORMAL HIGH (ref 11.5–15.5)
WBC: 14.6 10*3/uL — ABNORMAL HIGH (ref 4.0–10.5)
nRBC: 0 % (ref 0.0–0.2)

## 2022-06-02 LAB — COMPREHENSIVE METABOLIC PANEL
ALT: 7 U/L (ref 0–44)
AST: 25 U/L (ref 15–41)
Albumin: 3 g/dL — ABNORMAL LOW (ref 3.5–5.0)
Alkaline Phosphatase: 58 U/L (ref 38–126)
Anion gap: 11 (ref 5–15)
BUN: 69 mg/dL — ABNORMAL HIGH (ref 8–23)
CO2: 19 mmol/L — ABNORMAL LOW (ref 22–32)
Calcium: 9.2 mg/dL (ref 8.9–10.3)
Chloride: 103 mmol/L (ref 98–111)
Creatinine, Ser: 2.97 mg/dL — ABNORMAL HIGH (ref 0.61–1.24)
GFR, Estimated: 19 mL/min — ABNORMAL LOW (ref >60)
Glucose, Bld: 137 mg/dL — ABNORMAL HIGH (ref 70–99)
Potassium: 5 mmol/L (ref 3.5–5.1)
Sodium: 133 mmol/L — ABNORMAL LOW (ref 135–145)
Total Bilirubin: 1.2 mg/dL (ref 0.3–1.2)
Total Protein: 6.2 g/dL — ABNORMAL LOW (ref 6.5–8.1)

## 2022-06-02 MED ORDER — HYDROCODONE BIT-HOMATROP MBR 5-1.5 MG/5ML PO SOLN
5.0000 mL | Freq: Once | ORAL | Status: AC
  Administered 2022-06-02: 5 mL via ORAL
  Filled 2022-06-02: qty 5

## 2022-06-02 MED ORDER — LACTATED RINGERS IV BOLUS
500.0000 mL | Freq: Once | INTRAVENOUS | Status: AC
  Administered 2022-06-02: 500 mL via INTRAVENOUS

## 2022-06-02 NOTE — ED Notes (Signed)
X-ray at bedside

## 2022-06-02 NOTE — ED Provider Notes (Signed)
[place] EMERGENCY DEPARTMENT AT [facility] Provider Note   CSN: [id number] Arrival date & time: 06/01/22  2354     History  Chief Complaint  Patient presents with   Chest Pain    Left chest wall pain under pacemaker    MIVAAN STILLS is a 89 y.o. male.  89 year old male who presents ER today secondary to pain behind his pacemaker.  Patient was diagnosed with pneumonia recently started antibiotics.  He has 1 day left.  States that he has been coughing a bunch but seems to be improving.  His fever seems to be improving.  States that when he coughs or takes a deep breath he has pain in his chest behind his pacemaker but not really within the pacemaker itself.  No other new symptoms.  No trauma.   Chest Pain      Home Medications Prior to Admission medications   Medication Sig Start Date End Date Taking? Authorizing Provider  acetaminophen (TYLENOL) 500 MG tablet Take 1,000 mg by mouth 3 (three) times daily as needed for moderate pain.    [provider]  allopurinol (ZYLOPRIM) 100 MG tablet TAKE 1 TABLET(100 MG) BY MOUTH DAILY Patient taking differently: Take 100 mg by mouth daily. TAKE 1 TABLET(100 MG) BY MOUTH DAILY 12/04/21   Pincus Sanes, MD  amLODipine (NORVASC) 5 MG tablet Take 1 tablet (5 mg total) by mouth daily. 04/16/22   Pincus Sanes, MD  amoxicillin (AMOXIL) 500 MG capsule Take 2,000 mg by mouth as directed. 1 HOUR PRIOR TO DENTAL CLEANINGS AND PROCEDURES    [provider]  Aromatic Inhalants (VICKS VAPOR INHALER IN) Place 1 puff into both nostrils as needed (for congestion).    [provider]  Ascorbic Acid (VITAMIN C) 1000 MG tablet Take 1,000 mg by mouth daily.    [provider]  aspirin EC 81 MG tablet Take 81 mg by mouth daily.    [provider]  azithromycin (ZITHROMAX) 250 MG tablet Take two tabs the first day and then one tab daily for four days 05/29/22   Pincus Sanes, MD  Calcium  Citrate-Vitamin D (CITRACAL + D PO) Take 2 tablets by mouth in the morning and at bedtime.    [provider]  Carboxymeth-Glycerin-Polysorb (REFRESH OPTIVE MEGA-3 OP) Place 1 drop into both eyes 2 (two) times daily.    [provider]  carvedilol (COREG) 3.125 MG tablet Take 1 tablet (3.125 mg total) by mouth 2 (two) times daily. 05/07/22   Tonny Bollman, MD  Cholecalciferol 25 MCG (1000 UT) capsule Take 1,000 Units by mouth daily.    [provider]  cyanocobalamin (,VITAMIN B-12,) 1000 MCG/ML injection Inject 1,000 mcg into the muscle once. Monthly injection    [provider]  folic acid (FOLVITE) 1 MG tablet Take 1 tablet (1 mg total) by mouth daily. Annual appt due in May must see provider for future refills Patient taking differently: Take 1 mg by mouth at bedtime. Annual appt due in May must see provider for future refills 12/04/21   Pincus Sanes, MD  furosemide (LASIX) 40 MG tablet Take 1 tablet by mouth every Monday, Wednesday, and Friday 05/07/22   Tonny Bollman, MD  hydrALAZINE (APRESOLINE) 100 MG tablet Take 1 tablet (100 mg total) by mouth 3 (three) times daily. 04/16/22   Pincus Sanes, MD  HYDROcodone bit-homatropine (HYCODAN) 5-1.5 MG/5ML syrup Take 5 mLs by mouth every 8 (eight) hours as needed for  cough. 05/28/22   Pincus Sanes, MD  loratadine (CLARITIN) 10 MG tablet Take 10 mg by mouth daily as needed (for seasonal allergies).    [provider]  nitroGLYCERIN (NITROSTAT) 0.4 MG SL tablet DISSOLVE 1 TABLET UNDER THE TONGUE EVERY 5 MINUTES AS NEEDED FOR CHEST PAIN, MAXIMUM 3 TABLETS 04/30/19   Burns, Bobette Mo, MD  Saline (AYR NASAL MIST ALLERGY/SINUS NA) Place 2 sprays into the nose as needed (dryness).    [provider]  trolamine salicylate (ASPERCREME) 10 % cream Apply 1 Application topically in the morning and at bedtime. For leg cramps    [provider]  Vitamin D, Ergocalciferol, (DRISDOL) 1.25 MG (50000 UNIT)  CAPS capsule Take 50,000 Units by mouth every 30 (thirty) days. 07/13/21   [provider]      Allergies    Aspirin, Lisinopril, Amoxicillin, Atarax [hydroxyzine hcl], Cephalexin, Ciprofloxacin, Clindamycin, Clobetasol, Codeine, Fish allergy, Fluarix [influenza virus vaccine], Haemophilus influenzae, Hydrocodone, Hydrocodone-acetaminophen, Hydroxyzine, Latex, Macrobid [nitrofurantoin macrocrystal], Niacin, Niacin-lovastatin er, Niacin-lovastatin er, Nitrofurantoin, Omeprazole, Other, Tramadol, Vibramycin [doxycycline], Adhesive [tape], Bactrim [sulfamethoxazole-trimethoprim], Colchicine, Gabapentin, and Nortriptyline    Review of Systems   Review of Systems  Cardiovascular:  Positive for chest pain.    Physical Exam Updated Vital Signs BP 134/68   Pulse (!) 59   Temp 98.4 F (36.9 C) (Oral)   Resp 13   Ht 5\' 10"  (1.778 m)   Wt 64 kg   SpO2 97%   BMI 20.23 kg/m  Physical Exam Vitals and nursing note reviewed.  Constitutional:      Appearance: He is well-developed.  HENT:     Head: Normocephalic and atraumatic.  Cardiovascular:     Rate and Rhythm: Normal rate.  Pulmonary:     Effort: Pulmonary effort is normal. No respiratory distress.     Breath sounds: Examination of the right-middle field reveals rales. Examination of the right-lower field reveals rales. Rales present.  Abdominal:     General: There is no distension.  Musculoskeletal:        General: Normal range of motion.     Cervical back: Normal range of motion.  Neurological:     Mental Status: He is alert.     ED Results / Procedures / Treatments   Labs (all labs ordered are listed, but only abnormal results are displayed) Labs Reviewed  CBC WITH DIFFERENTIAL/PLATELET - Abnormal; Notable for the following components:      Result Value   WBC 14.6 (*)    RBC 3.76 (*)    Hemoglobin 9.4 (*)    HCT 29.1 (*)    MCV 77.4 (*)    MCH 25.0 (*)    RDW 16.7 (*)    Neutro Abs 13.6 (*)    Lymphs Abs 0.5 (*)     Abs Immature Granulocytes 0.08 (*)    All other components within normal limits  COMPREHENSIVE METABOLIC PANEL - Abnormal; Notable for the following components:   Sodium 133 (*)    CO2 19 (*)    Glucose, Bld 137 (*)    BUN 69 (*)    Creatinine, Ser 2.97 (*)    Total Protein 6.2 (*)    Albumin 3.0 (*)    GFR, Estimated 19 (*)    All other components within normal limits  TROPONIN I (HIGH SENSITIVITY) - Abnormal; Notable for the following components:   Troponin I (High Sensitivity) 25 (*)    All other components within normal limits  TROPONIN I (HIGH  SENSITIVITY) - Abnormal; Notable for the following components:   Troponin I (High Sensitivity) 26 (*)    All other components within normal limits  TROPONIN I (HIGH SENSITIVITY) - Abnormal; Notable for the following components:   Troponin I (High Sensitivity) 21 (*)    All other components within normal limits    EKG EKG Interpretation  Date/Time:  Saturday Jun 02 2022 00:38:36 EDT Ventricular Rate:  67 PR Interval:  172 QRS Duration: 166 QT Interval:  467 QTC Calculation: 493 R Axis:   -82 Text Interpretation: Sinus or ectopic atrial rhythm Atrial premature complex RBBB and LAFB Confirmed by Marily Memos [phone]) on 06/02/2022 12:59:46 AM  Radiology DG Chest Portable 1 View  Result Date: 06/02/2022 CLINICAL DATA:  Cough EXAM: PORTABLE CHEST 1 VIEW COMPARISON:  03/03/2022 FINDINGS: Prior median sternotomy and CABG. Prior TAVR. Left pacer remains in place, unchanged. Mild cardiomegaly. Aortic atherosclerosis. Mediastinal contours within normal limits. Left lung clear. Focal airspace opacity in the mid right lung, likely inferior right upper lobe compatible with pneumonia. No effusions or acute bony abnormality. IMPRESSION: Right mid lung, likely inferior right upper lobe pneumonia. Cardiomegaly. Electronically Signed   By: Charlett Nose M.D.   On: 06/02/2022 01:48    Procedures Procedures    Medications Ordered in  ED Medications  HYDROcodone bit-homatropine (HYCODAN) 5-1.5 MG/5ML syrup 5 mL (5 mLs Oral Given 06/02/22 0151)  lactated ringers bolus 500 mL (0 mLs Intravenous Stopped 06/02/22 0221)  lactated ringers bolus 500 mL (0 mLs Intravenous Stopped 06/02/22 0533)    ED Course/ Medical Decision Making/ A&P                             Medical Decision Making Amount and/or Complexity of Data Reviewed Labs: ordered. Radiology: ordered. ECG/medicine tests: ordered.  Risk Prescription drug management.   Chest x-ray interpreted by myself as pneumonia.  He is already being treated and symptoms are improving so I do not see any indication for change in therapy.  Hycodan given and his pain and coughing has improved.  He has is at home as well.  EKG is reassuring.  I am pending his troponin just to ensure this is not cardiac in nature although it does sound pleuritic consistent with his pneumonia and coughing.  Delta troponins reassuring. Pain improved. VSS. Seems pleuritic in nature likely related to cough from PNA. Advised following up with PCP in 2 weeks for repeat CXR. Return here if new/worsning symptoms.    Final Clinical Impression(s) / ED Diagnoses Final diagnoses:  Pneumonia of right lower lobe due to infectious organism  Nonspecific chest pain    Rx / DC Orders ED Discharge Orders     None         Keyontae Huckeby, Barbara Cower, MD 06/02/22 2358

## 2022-06-02 NOTE — ED Triage Notes (Signed)
Pt from home via ems c/o left chest wall pain under pacemaker. Also has been coughing 3 weeks w/ dark brown mucus. Pt warm to touch. Tylonal 650, solumedrol, duoneb given in router. Hx 2 stents, valve replacement and mi. Hx pnuemonia.on zithromyacin for 4 days from primary w/ no improvement

## 2022-06-06 ENCOUNTER — Encounter (HOSPITAL_COMMUNITY)
Admission: RE | Admit: 2022-06-06 | Discharge: 2022-06-06 | Disposition: A | Discharge status: Home / Self Care | Payer: Medicare Other | Source: Ambulatory Visit | Attending: Cardiovascular Disease | Admitting: Cardiovascular Disease

## 2022-06-06 DIAGNOSIS — Z952 Presence of prosthetic heart valve: Secondary | ICD-10-CM

## 2022-06-06 NOTE — Progress Notes (Signed)
Incomplete Session Note  Patient Details  Name: [name] MRN: [id number] Date of Birth: 1926-10-14 Referring Provider:   Flowsheet Row INTENSIVE CARDIAC REHAB ORIENT from 05/17/2022 in [facility] for Heart, Vascular, & Lung Health  Referring Provider Dr. Tonny Bollman, MD       [name] did not complete his rehab session.  It was noted that [name] went to the ED on 06/02/22 and was diagnosed with pneumonia. Advised the patient not to exercise today will need clearance from PCP Dr Lawerance Bach to return to exercise. Patient and wife state understanding.Thayer Headings RN BSN

## 2022-06-07 DIAGNOSIS — J189 Pneumonia, unspecified organism: Secondary | ICD-10-CM | POA: Insufficient documentation

## 2022-06-07 NOTE — Patient Instructions (Addendum)
    Residual pneumonia symptoms should continue to improve over time.   Medications changes include :   none    Ok to return to cardiac rehab     Return if symptoms worsen or fail to improve.

## 2022-06-07 NOTE — Progress Notes (Unsigned)
Subjective:    Patient ID: [name], male    DOB: [birth year], 89 y.o.   MRN: [id number]     HPI Cornellius is here for follow up from the hospital.  06/01/2022 ED for chest pain.  He had a URI and I started him on zpak 5/21.  He had had 1 day left.  Cough was improving.  Fever improving.  He was experiencing left-sided chest pain behind his pacemaker when he coughed and when he took deep breaths. EKG stable.  Chest x-ray with right middle lobe and inferior right upper lobe pneumonia.  CBC with leukocytosis, chronic anemia.  Slightly elevated troponin, CMP stable, second troponin stable.  He received fluids, cough syrup.  No change in therapy.  To start cardiac rehab.   Coughing is better - still has some mucus - thick and cream colored.     Medications and allergies reviewed with patient and updated if appropriate.  Current Outpatient Medications on File Prior to Visit  Medication Sig Dispense Refill   acetaminophen (TYLENOL) 500 MG tablet Take 1,000 mg by mouth 3 (three) times daily as needed for moderate pain.     allopurinol (ZYLOPRIM) 100 MG tablet TAKE 1 TABLET(100 MG) BY MOUTH DAILY (Patient taking differently: Take 100 mg by mouth daily. TAKE 1 TABLET(100 MG) BY MOUTH DAILY) 30 tablet 0   amLODipine (NORVASC) 5 MG tablet Take 1 tablet (5 mg total) by mouth daily. 90 tablet 1   amoxicillin (AMOXIL) 500 MG capsule Take 2,000 mg by mouth as directed. 1 HOUR PRIOR TO DENTAL CLEANINGS AND PROCEDURES     Aromatic Inhalants (VICKS VAPOR INHALER IN) Place 1 puff into both nostrils as needed (for congestion).     Ascorbic Acid (VITAMIN C) 1000 MG tablet Take 1,000 mg by mouth daily.     aspirin EC 81 MG tablet Take 81 mg by mouth daily.     azithromycin (ZITHROMAX) 250 MG tablet Take two tabs the first day and then one tab daily for four days 6 tablet 0   Calcium Citrate-Vitamin D (CITRACAL + D PO) Take 2 tablets by mouth in the morning and at bedtime.      Carboxymeth-Glycerin-Polysorb (REFRESH OPTIVE MEGA-3 OP) Place 1 drop into both eyes 2 (two) times daily.     carvedilol (COREG) 3.125 MG tablet Take 1 tablet (3.125 mg total) by mouth 2 (two) times daily. 180 tablet 3   Cholecalciferol 25 MCG (1000 UT) capsule Take 1,000 Units by mouth daily.     cyanocobalamin (,VITAMIN B-12,) 1000 MCG/ML injection Inject 1,000 mcg into the muscle once. Monthly injection     folic acid (FOLVITE) 1 MG tablet Take 1 tablet (1 mg total) by mouth daily. Annual appt due in May must see provider for future refills (Patient taking differently: Take 1 mg by mouth at bedtime. Annual appt due in May must see provider for future refills) 90 tablet 2   furosemide (LASIX) 40 MG tablet Take 1 tablet by mouth every Monday, Wednesday, and Friday 36 tablet 3   hydrALAZINE (APRESOLINE) 100 MG tablet Take 1 tablet (100 mg total) by mouth 3 (three) times daily. 90 tablet 5   HYDROcodone bit-homatropine (HYCODAN) 5-1.5 MG/5ML syrup Take 5 mLs by mouth every 8 (eight) hours as needed for cough. 120 mL 0   loratadine (CLARITIN) 10 MG tablet Take 10 mg by mouth daily as needed (for seasonal allergies).     nitroGLYCERIN (NITROSTAT) 0.4 MG SL tablet DISSOLVE  1 TABLET UNDER THE TONGUE EVERY 5 MINUTES AS NEEDED FOR CHEST PAIN, MAXIMUM 3 TABLETS 100 tablet 1   Saline (AYR NASAL MIST ALLERGY/SINUS NA) Place 2 sprays into the nose as needed (dryness).     trolamine salicylate (ASPERCREME) 10 % cream Apply 1 Application topically in the morning and at bedtime. For leg cramps     Vitamin D, Ergocalciferol, (DRISDOL) 1.25 MG (50000 UNIT) CAPS capsule Take 50,000 Units by mouth every 30 (thirty) days.     Current Facility-Administered Medications on File Prior to Visit  Medication Dose Route Frequency Provider Last Rate Last Admin   NON FORMULARY 1 application  1 application  Topical PRN Asencion Islam, DPM         Review of Systems  Constitutional:  Positive for appetite change (dec) and  fatigue. Negative for fever.  Respiratory:  Positive for cough (better - productive - thick cream mucus) and shortness of breath (chronic). Negative for wheezing.   Cardiovascular:  Negative for chest pain.  Neurological:  Negative for light-headedness and headaches.       Objective:   Vitals:   06/08/22 1108  BP: 120/70  Pulse: 70  Temp: 97.9 F (36.6 C)  SpO2: 98%   BP Readings from Last 3 Encounters:  06/08/22 120/70  06/02/22 134/68  05/28/22 124/60   Wt Readings from Last 3 Encounters:  06/08/22 149 lb (67.6 kg)  06/02/22 141 lb (64 kg)  05/28/22 149 lb 6.4 oz (67.8 kg)   Body mass index is 21.38 kg/m.    Physical Exam Constitutional:      General: He is not in acute distress.    Appearance: He is not ill-appearing.     Comments: Chronically ill appearing  HENT:     Head: Normocephalic and atraumatic.  Cardiovascular:     Rate and Rhythm: Normal rate and regular rhythm.  Pulmonary:     Effort: Pulmonary effort is normal. No respiratory distress.     Breath sounds: Normal breath sounds. No wheezing or rales.  Skin:    General: Skin is warm and dry.  Neurological:     Mental Status: He is alert.        Lab Results  Component Value Date   WBC 14.6 (H) 06/02/2022   HGB 9.4 (L) 06/02/2022   HCT 29.1 (L) 06/02/2022   PLT 260 06/02/2022   GLUCOSE 137 (H) 06/02/2022   CHOL 122 09/04/2016   TRIG 59.0 09/04/2016   HDL 40.40 09/04/2016   LDLCALC 70 09/04/2016   ALT 7 06/02/2022   AST 25 06/02/2022   NA 133 (L) 06/02/2022   K 5.0 06/02/2022   CL 103 06/02/2022   CREATININE 2.97 (H) 06/02/2022   BUN 69 (H) 06/02/2022   CO2 19 (L) 06/02/2022   TSH 1.940 04/03/2022   PSA 0.44 07/18/2007   INR 1.1 04/05/2022     Assessment & Plan:    See Problem List for Assessment and Plan of chronic medical problems.

## 2022-06-08 ENCOUNTER — Ambulatory Visit (INDEPENDENT_AMBULATORY_CARE_PROVIDER_SITE_OTHER): Payer: Medicare Other | Admitting: Internal Medicine

## 2022-06-08 ENCOUNTER — Encounter (HOSPITAL_COMMUNITY): Payer: Medicare Other

## 2022-06-08 ENCOUNTER — Encounter: Payer: Self-pay | Admitting: Internal Medicine

## 2022-06-08 ENCOUNTER — Telehealth: Payer: Self-pay

## 2022-06-08 VITALS — BP 120/70 | HR 70 | Temp 97.9°F | Ht 70.0 in | Wt 149.0 lb

## 2022-06-08 DIAGNOSIS — I10 Essential (primary) hypertension: Secondary | ICD-10-CM

## 2022-06-08 DIAGNOSIS — J189 Pneumonia, unspecified organism: Secondary | ICD-10-CM

## 2022-06-08 NOTE — Telephone Encounter (Signed)
Transition Care Management Unsuccessful Follow-up Telephone Call  Date of discharge and from where:  06/02/2022 [facility]  Attempts:  1st Attempt  Reason for unsuccessful TCM follow-up call:  Left voice message  English Tomer [facility]  [facility] Population Health Community Resource Care Guide   [email or web address]  ?? [phone]   Website: [email or web address]  [email or web address]

## 2022-06-10 NOTE — Assessment & Plan Note (Addendum)
Recent emergency room for pneumonia He was already on azithromycin and no changes to the antibiotic was made-finishes that today Cough is better, but still has adductive cough of cream-colored mucus-thick in nature.  Has chronic shortness of breath-close to baseline.  No wheezing or fevers Appetite and fatigue have improved, but still low Definite improvement with the azithromycin-at this point hard to tell if his pneumonia has been completely treated and not, but given the improvement and several antibiotic allergies and risk of additional antibiotics will have him finish the azithromycin today and monitor over the weekend If is not continuing to feel better or feels any worse he will call and at that point I would add another antibiotic Continue cough syrup-Hycodan Continue supportive care

## 2022-06-10 NOTE — Assessment & Plan Note (Addendum)
Chronic Blood pressure well-controlled Continue amlodipine 5 mg daily, hydralazine 100 mg 3 times daily, Coreg 3.125 mg twice daily

## 2022-06-11 ENCOUNTER — Telehealth: Payer: Self-pay

## 2022-06-11 ENCOUNTER — Encounter (HOSPITAL_COMMUNITY)
Admission: RE | Admit: 2022-06-11 | Discharge: 2022-06-11 | Disposition: A | Discharge status: Home / Self Care | Payer: Medicare Other | Source: Ambulatory Visit | Attending: Cardiology | Admitting: Cardiology

## 2022-06-11 ENCOUNTER — Ambulatory Visit: Payer: Medicare Other | Admitting: Internal Medicine

## 2022-06-11 DIAGNOSIS — N183 Chronic kidney disease, stage 3 unspecified: Secondary | ICD-10-CM | POA: Insufficient documentation

## 2022-06-11 DIAGNOSIS — Z952 Presence of prosthetic heart valve: Secondary | ICD-10-CM | POA: Diagnosis not present

## 2022-06-11 NOTE — Progress Notes (Signed)
Daily Session Note  Patient Details  Name: [name] MRN: [id number] Date of Birth: [birth year] Referring Provider:   Flowsheet Row INTENSIVE CARDIAC REHAB ORIENT from 05/17/2022 in [facility] for Heart, Vascular, & Lung Health  Referring Provider Dr. Tonny Bollman, MD       Encounter Date: 06/11/2022  Check In:  Session Check In - 06/11/22 1523       Check-In   Supervising physician immediately available to respond to emergencies [facility] - Physician supervision    Physician(s) Carlos Levering, NP    Location [facility]    Staff Present Gladstone Lighter, RN, Marton Redwood, MS, ACSM-CEP, CCRP, Exercise Physiologist;Sarah Cleophas Dunker, RN, MSN;Bailey Wallace Cullens, MS, Exercise Physiologist;Olinty Peggye Pitt, MS, ACSM-CEP, Exercise Physiologist;Casey Katrinka Blazing, RT;Jetta Walker BS, ACSM-CEP, Exercise Physiologist    Virtual Visit No    Medication changes reported     No    Fall or balance concerns reported    No    Tobacco Cessation No Change    Warm-up and Cool-down Performed as group-led instruction    Resistance Training Performed Yes    VAD Patient? No    PAD/SET Patient? No      Pain Assessment   Currently in Pain? No/denies    Pain Score 0-No pain    Multiple Pain Sites No             Capillary Blood Glucose: No results found. However, due to the size of the patient record, not all encounters were searched. Please check Results Review for a complete set of results.    Social History   Tobacco Use  Smoking Status Never  Smokeless Tobacco Never    Goals Met:  Exercise tolerated well No report of concerns or symptoms today Strength training completed today  Goals Unmet:  Not Applicable  Comments: Pt started cardiac rehab today.  Pt tolerated light exercise without difficulty. VSS, telemetry-paced, asymptomatic.  Medication list reconciled. Pt denies barriers to medicaiton compliance.  PSYCHOSOCIAL ASSESSMENT:  PHQ-5. Pt  exhibits positive coping skills, hopeful outlook with supportive family. No psychosocial needs identified at this time, no psychosocial interventions necessary.    Pt enjoys genealogy.   Pt oriented to exercise equipment and routine.    Understanding verbalized. Rishawn is deconditioned and uses a can for stability.Thayer Headings RN BSN    Dr. Armanda Magic is Medical Director for Cardiac Rehab at [facility].

## 2022-06-11 NOTE — Telephone Encounter (Signed)
Transition Care Management Unsuccessful Follow-up Telephone Call  Date of discharge and from where:  06/02/2022 [facility]  Attempts:  2nd Attempt  Reason for unsuccessful TCM follow-up call:  No answer/busy  [name]  [facility] Population Health Community Resource Care Guide   [name]  ?? [phone]   Website: [email or web address]  [email or web address]

## 2022-06-12 ENCOUNTER — Telehealth: Payer: Self-pay | Admitting: Physician Assistant

## 2022-06-12 NOTE — Telephone Encounter (Signed)
Called patient about message. Patient's wife (DPR) stated that patient picked up amlodipine 10 mg tablets from his pharmacy that was ordered by Carlean Jews PA. She stated this was filled on 05/17/22. Patient's medication list shows patient should be taking amlodipine 5 mg tablet and was last filled by Dr. Lawerance Bach in April. Will send message to Cline Crock PA to clarify what patient should be on.

## 2022-06-12 NOTE — Telephone Encounter (Signed)
Called patient's wife back with Katie Thompson's PA response to her question.  Janetta Hora, PA-C  to Me    06/12/22 12:29 PM That's interesting. I have never seen this pt and never called in a Rx. It does appear he should be on the 5mg  daily. I have no idea how it got changed to 10mg  or put under my name. Tell him okay to continue on 5mg  and we can call in the correct dosage if he needs. If he picked up the 10mg  and wants to use, it is okay to cut in half.  KT  Informed patient's wife of message above. Patient's wife verbalized understanding. I did inform her that there was a canceled order for a amlodipine 10 mg on 02/07/22, that was directed to stop at discharge, patient was being discharged from hospital at the time. Then there was another order placed on 02/07/22 that was for amlodipine 10 mg to take 1/2 tablet daily. Both of these were canceled earlier this year in February, but should have been replaced with Dr. Lawerance Bach order for amlodipine 5 mg in April. Patient's wife will continue giving patient amlodipine 5 mg daily.

## 2022-06-12 NOTE — Progress Notes (Signed)
Cardiac Individual Treatment Plan  Patient Details  Name: [name] MRN: [id number] Date of Birth: Dec 15, 1926 Referring Provider:   Flowsheet Row INTENSIVE CARDIAC REHAB ORIENT from 05/17/2022 in [facility]  Referring Provider Dr. Tonny Bollman, MD       Initial Encounter Date:  Flowsheet Row INTENSIVE CARDIAC REHAB ORIENT from 05/17/2022 in [facility]  Date 05/17/22       Visit Diagnosis: 02/06/22 S/P TAVR (transcatheter aortic valve replacement)  Patient's Home Medications on Admission:  Current Outpatient Medications:    acetaminophen (TYLENOL) 500 MG tablet, Take 1,000 mg by mouth 3 (three) times daily as needed for moderate pain., Disp: , Rfl:    allopurinol (ZYLOPRIM) 100 MG tablet, TAKE 1 TABLET(100 MG) BY MOUTH DAILY (Patient taking differently: Take 100 mg by mouth daily. TAKE 1 TABLET(100 MG) BY MOUTH DAILY), Disp: 30 tablet, Rfl: 0   amLODipine (NORVASC) 5 MG tablet, Take 1 tablet (5 mg total) by mouth daily., Disp: 90 tablet, Rfl: 1   amoxicillin (AMOXIL) 500 MG capsule, Take 2,000 mg by mouth as directed. 1 HOUR PRIOR TO DENTAL CLEANINGS AND PROCEDURES, Disp: , Rfl:    Aromatic Inhalants (VICKS VAPOR INHALER IN), Place 1 puff into both nostrils as needed (for congestion)., Disp: , Rfl:    Ascorbic Acid (VITAMIN C) 1000 MG tablet, Take 1,000 mg by mouth daily., Disp: , Rfl:    aspirin EC 81 MG tablet, Take 81 mg by mouth daily., Disp: , Rfl:    azithromycin (ZITHROMAX) 250 MG tablet, Take two tabs the first day and then one tab daily for four days, Disp: 6 tablet, Rfl: 0   Calcium Citrate-Vitamin D (CITRACAL + D PO), Take 2 tablets by mouth in the morning and at bedtime., Disp: , Rfl:    Carboxymeth-Glycerin-Polysorb (REFRESH OPTIVE MEGA-3 OP), Place 1 drop into both eyes 2 (two) times daily., Disp: , Rfl:    carvedilol (COREG) 3.125 MG tablet,  Take 1 tablet (3.125 mg total) by mouth 2 (two) times daily., Disp: 180 tablet, Rfl: 3   Cholecalciferol 25 MCG (1000 UT) capsule, Take 1,000 Units by mouth daily., Disp: , Rfl:    cyanocobalamin (,VITAMIN B-12,) 1000 MCG/ML injection, Inject 1,000 mcg into the muscle once. Monthly injection, Disp: , Rfl:    folic acid (FOLVITE) 1 MG tablet, Take 1 tablet (1 mg total) by mouth daily. Annual appt due in May must see provider for future refills (Patient taking differently: Take 1 mg by mouth at bedtime. Annual appt due in May must see provider for future refills), Disp: 90 tablet, Rfl: 2   furosemide (LASIX) 40 MG tablet, Take 1 tablet by mouth every Monday, Wednesday, and Friday, Disp: 36 tablet, Rfl: 3   hydrALAZINE (APRESOLINE) 100 MG tablet, Take 1 tablet (100 mg total) by mouth 3 (three) times daily., Disp: 90 tablet, Rfl: 5   HYDROcodone bit-homatropine (HYCODAN) 5-1.5 MG/5ML syrup, Take 5 mLs by mouth every 8 (eight) hours as needed for cough., Disp: 120 mL, Rfl: 0   loratadine (CLARITIN) 10 MG tablet, Take 10 mg by mouth daily as needed (for seasonal allergies)., Disp: , Rfl:    nitroGLYCERIN (NITROSTAT) 0.4 MG SL tablet, DISSOLVE 1 TABLET UNDER THE TONGUE EVERY 5 MINUTES AS NEEDED FOR CHEST PAIN, MAXIMUM 3 TABLETS, Disp: 100 tablet, Rfl: 1   Saline (AYR NASAL MIST ALLERGY/SINUS NA), Place 2 sprays into the nose  as needed (dryness)., Disp: , Rfl:    trolamine salicylate (ASPERCREME) 10 % cream, Apply 1 Application topically in the morning and at bedtime. 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 S/P stenting to mid RCA, prox PDA 06/1999. b. NSTEMI/CABG x 3 in 10/2011 with LIMA to LAD, SVG to PDA, and SVG to OM1.    Cephalalgia 07/14/2015   CHB (complete heart block) 05/03/2012   Overview:  Status post complete heart block heart rate 28 bpm, alternating with 2 to one AV block and drug for bradycardia.  Status post pacemaker implantation.status post Medtronic pacemaker implantation the 03/28/2012   Chronic diastolic CHF (congestive heart failure) 02/05/2018   Chronic gouty arthritis    Chronic UTI    a. Followed by Dr. [name] Caprice - colonized/asymptomatic - not on abx   CKD (chronic kidney disease)    stage 3, GFR 30-59 ml/min; stable with a creatinine around 1.9-2.0 followed by nephrology.   Constipation 03/01/2015   Symptoms and exam consistent with constipation. Abdominal exam is benign with no evidence of pain or obstruction. Discussed importance of increasing fiber and water intake coupled with physical activity to assist with bowel movements. Continue over-the-counter medication management as needed. Follow-up if symptoms worsen or fail to improve.   Coronary atherosclerosis 11/27/2011   Overview:  Multivessel coronary artery disease recently diagnosed by catheterization 2013 History of stent placement with a heparin-coated stent 2001  Last Assessment & Plan:  Status post coronary bypass grafting.  No recurrent chest pain. Overview:  Overview:  S/P stenting to mid RCA, prox PDA 06/1999;  06/2007 Myoview: negative except for apical thinning, EF 68%, last Myoview August 10, 2008 with n   Degeneration of lumbar intervertebral disc    Degenerative disc disease, cervical, with radiculopathy 09/21/2008   Diastolic dysfunction 07/04/2017   Grade 1 DD on Echo 06/2017   Dyslipidemia    Dysphagia 02/06/2016   3/18 - DG Esophagus:  1. Mild esophageal dysmotility, likely presbyesophagus. 2. No other explanation for patient's symptoms. 3.  Small hiatal hernia.  On swallow eval - evidence of cervical spine disease and that was likely contributing   Epistaxis 12/08/2020   Essential hypertension 07/20/2006   GERD (gastroesophageal reflux disease)    Gout 05/12/2018   Gouty arthritis of right great toe 09/03/2017   Left toe Injected January 31, 2018    Hyperpigmentation 11/04/2017   Internal hemorrhoids 08/15/2016   Left inguinal hernia 07/01/2019   Lumbar post-laminectomy syndrome 12/12/2017   Moderate aortic regurgitation 07/04/2017   Echo 06/2017:  EF 55-60%, mild LVH, grade 1 DD, mild AS, mod AR, mild MR, mild-mod TR   Neck injury    a. 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  This was switched to by mouth amiodarone at the time of the thoracic surgery appointment was discontinued.   Presence of aortocoronary bypass graft 10/24/2011   Overview:  Performed at [facility] 2013 Last Assessment & Plan:  No complication post coronary bypass grafting.   Prostate cancer (HCC)    a. 2001 s/p TURP.   Pulmonary nodule    a. felt to be noncancerous.  Status post followup CT scan 4 mm and stable.   Renal artery stenosis    a. 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 Excell Seltzer & Dr. 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   Trochanteric bursitis of hip,  bilateral 01/10/2017   UGIB (upper gastrointestinal bleed) 02/01/2018   EGD  02/06/18 - mod, non-erosive gastritis   Venous insufficiency of leg 06/05/2010   Vitamin B12 deficiency 08/23/2009   Jan '14  July '14 B12 level  >1500    472   Weakness of both lower extremities 04/09/2020    Tobacco Use: Social History   Tobacco Use  Smoking Status Never  Smokeless Tobacco Never    Labs: Review Flowsheet  More data exists      Latest Ref Rng & Units 08/31/2014 09/01/2015 09/04/2016 02/06/2022 03/02/2022  Labs for ITP Cardiac and Pulmonary Rehab  Cholestrol 0 - 200 mg/dL 191  478  295  - -  LDL (calc) 0 - 99 mg/dL 56  70  70  - -  HDL-C >39.00 mg/dL 62.13  08.65  78.46  - -  Trlycerides 0.0 - 149.0 mg/dL 96.2  952.8  41.3  - -  PH, Arterial 7.35 - 7.45 7.35 - 7.45 - - - - 7.410  7.411   PCO2 arterial 32 - 48 mmHg 32 - 48 mmHg - - - - 29.6  29.1   Bicarbonate 20.0 - 28.0 mmol/L 20.0 - 28.0 mmol/L - - - - 18.7  18.5   TCO2 22 - 32 mmol/L 22 - 32 mmol/L - - - 20  20  19    Acid-base deficit 0.0 - 2.0 mmol/L 0.0 - 2.0 mmol/L - - - - 5.0  5.0   O2 Saturation % % - - - - 66  67     Capillary Blood Glucose: Lab Results  Component Value Date   GLUCAP 105 (H) 10/22/2011   GLUCAP 99 10/22/2011   GLUCAP 88 10/22/2011   GLUCAP 144 (H) 10/21/2011   GLUCAP 153 (H) 10/21/2011     Exercise Target Goals: Exercise Program Goal: Individual exercise prescription set using results from initial 6 min walk test and THRR while considering  patient's activity barriers and safety.   Exercise Prescription Goal: Initial exercise prescription builds to 30-45 minutes a day of aerobic activity, 2-3 days per week.  Home exercise guidelines will be given to patient during program as part of exercise prescription that the participant will acknowledge.  Activity Barriers & Risk Stratification:  Activity Barriers & Cardiac Risk Stratification - 05/17/22 1551       Activity Barriers & Cardiac Risk  Stratification   Activity Barriers Joint Problems;Assistive Device;Deconditioning;Arthritis;Back Problems;Muscular Weakness;Decreased Ventricular Function;Neck/Spine Problems;Balance Concerns;History of  Falls    Cardiac Risk Stratification High             6 Minute Walk:  6 Minute Walk     Row Name 05/17/22 1543         6 Minute Walk   Phase Initial  Nustep test due to poor vision     Distance 1476 feet     Walk Time 6 minutes     # of Rest Breaks 0     MPH 2.8     METS 2.49     RPE 12     Perceived Dyspnea  0     VO2 Peak 8.72     Symptoms Yes (comment)     Comments 2 mins in decreased SPM due to high HR, asymptomatic. Bilateral knee to foot pain 4/10, resolved with rest. AVG km 0.45, [address]VE [place]: 28, AVG SPM 86, total steps 519, total feet: 1476     Resting HR 68 bpm     Resting BP 132/74     Resting Oxygen Saturation  99 %     Exercise Oxygen Saturation  during 6 min walk 100 %     Max Ex. HR 117 bpm     Max Ex. BP 154/66     2 Minute Post BP 146/72              Oxygen Initial Assessment:   Oxygen Re-Evaluation:   Oxygen Discharge (Final Oxygen Re-Evaluation):   Initial Exercise Prescription:  Initial Exercise Prescription - 05/17/22 1500       Date of Initial Exercise RX and Referring Provider   Date 05/17/22    Referring Provider Dr. Tonny Bollman, MD    Expected Discharge Date 07/27/22      NuStep   Level 1    SPM 85    Minutes 25    METs 1.9      Prescription Details   Frequency (times per week) 3    Duration Progress to 30 minutes of continuous aerobic without signs/symptoms of physical distress      Intensity   THRR 40-80% of Max Heartrate 51-102    Ratings of Perceived Exertion 11-13    Perceived Dyspnea 0-4      Progression   Progression Continue progressive overload as per policy without signs/symptoms or physical distress.      Resistance Training   Training Prescription Yes    Weight 2    Reps 10-15              Perform Capillary Blood Glucose checks as needed.  Exercise Prescription Changes:   Exercise Comments:   Exercise Comments     Row Name 06/11/22 1720           Exercise Comments Pt first day in the CRP2 program. Pt tolerated exercise well with an average MET level of 2.65. Pt is learning her THRR, RPE and ExRx. Off to a great start. Will continue to monitor pt and progress workloads as tolerated without sign or symptom                Exercise Goals and Review:   Exercise Goals     Row Name 05/17/22 1554             Exercise Goals   Increase Physical Activity Yes       Intervention Provide advice, education, support and counseling about physical activity/exercise needs.;Develop an individualized exercise prescription for aerobic and resistive training based on initial  evaluation findings, risk stratification, comorbidities and participant's personal goals.       Expected Outcomes Short Term: Attend rehab on a regular basis to increase amount of physical activity.;Long Term: Exercising regularly at least 3-5 days a week.;Long Term: Add in home exercise to make exercise part of routine and to increase amount of physical activity.       Increase Strength and Stamina Yes       Intervention Provide advice, education, support and counseling about physical activity/exercise needs.;Develop an individualized exercise prescription for aerobic and resistive training based on initial evaluation findings, risk stratification, comorbidities and participant's personal goals.       Expected Outcomes Short Term: Increase workloads from initial exercise prescription for resistance, speed, and METs.;Short Term: Perform resistance training exercises routinely during rehab and add in resistance training at home;Long Term: Improve cardiorespiratory fitness, muscular endurance and strength as measured by increased METs and functional capacity ( )       Able to understand and use rate of  perceived exertion (RPE) scale Yes       Intervention Provide education and explanation on how to use RPE scale       Expected Outcomes Short Term: Able to use RPE daily in rehab to express subjective intensity level;Long Term:  Able to use RPE to guide intensity level when exercising independently       Knowledge and understanding of Target Heart Rate Range (THRR) Yes       Intervention Provide education and explanation of THRR including how the numbers were predicted and where they are located for reference       Expected Outcomes Short Term: Able to state/look up THRR;Long Term: Able to use THRR to govern intensity when exercising independently;Short Term: Able to use daily as guideline for intensity in rehab       Understanding of Exercise Prescription Yes       Intervention Provide education, explanation, and written materials on patient's individual exercise prescription       Expected Outcomes Short Term: Able to explain program exercise prescription;Long Term: Able to explain home exercise prescription to exercise independently                Exercise Goals Re-Evaluation :  Exercise Goals Re-Evaluation     Row Name 06/11/22 1720             Exercise Goal Re-Evaluation   Exercise Goals Review Increase Physical Activity;Understanding of Exercise Prescription;Increase Strength and Stamina;Knowledge and understanding of Target Heart Rate Range (THRR);Able to understand and use rate of perceived exertion (RPE) scale       Comments Pt first day in the CRP2 program. Pt tolerated exercise well with an average MET level of 2.65. Pt is learning her THRR, RPE and ExRx. Off to a great start.       Expected Outcomes Will continue to monitor pt and progress workloads as tolerated without sign or symptom                Discharge Exercise Prescription (Final Exercise Prescription Changes):   Nutrition:  Target Goals: Understanding of nutrition guidelines, daily intake of sodium  1500mg , cholesterol 200mg , calories 30% from fat and 7% or less from saturated fats, daily to have 5 or more servings of fruits and vegetables.  Biometrics:  Pre Biometrics - 05/17/22 1530       Pre Biometrics   Waist Circumference 38.75 inches    Hip Circumference 37.75 inches    Waist to Hip  Ratio 1.03 %    Triceps Skinfold 5 mm    % Body Fat 22.4 %    Grip Strength 30 kg    Flexibility --   Did not attempt due to spinal fusion on hip pain   Single Leg Stand --   Did not attempt due to falls and assistive device             Nutrition Therapy Plan and Nutrition Goals:  Nutrition Therapy & Goals - 06/11/22 1533       Nutrition Therapy   Diet Heart healthy diet      Personal Nutrition Goals   Nutrition Goal Patient to identify strategies for reducing cardiovascular risk by attending the [employer] education and nutrition series weekly.    Personal Goal #2 Patient to identify strategies for weight gain/maintenance of 0.5-2.0# per week.    Comments [name] reports eating a wide variety of foods and eating "like a horse". He was diagnosed with pneumonia on 06/01/22; he is down 15# since his orientation date. In speaking with his wife, she wonders if some of the weight discrepancy is fluid. Will continue to monitor weight. His wife is very supportive. He continues regular follow-up with nephrology. Patient will benefit from participation in intensive cardiac rehab for nutrition, exercise, and lifestyle modification.      Intervention Plan   Intervention Prescribe, educate and counsel regarding individualized specific dietary modifications aiming towards targeted core components such as weight, hypertension, lipid management, diabetes, heart failure and other comorbidities.;Nutrition handout(s) given to patient.    Expected Outcomes Short Term Goal: Understand basic principles of dietary content, such as calories, fat, sodium, cholesterol and nutrients.;Long Term Goal: Adherence to  prescribed nutrition plan.             Nutrition Assessments:  Nutrition Assessments - 06/08/22 1416       Rate Your Plate Scores   Pre Score 62            MEDIFICTS Score Key: ?70 Need to make dietary changes  40-70 Heart Healthy Diet ? 40 Therapeutic Level Cholesterol Diet   Flowsheet Row INTENSIVE CARDIAC REHAB from 06/06/2022 in [facility]  Picture Your Plate Total Score on Admission 62      Picture Your Plate Scores: <29 Unhealthy dietary pattern with much room for improvement. 41-50 Dietary pattern unlikely to meet recommendations for good health and room for improvement. 51-60 More healthful dietary pattern, with some room for improvement.  >60 Healthy dietary pattern, although there may be some specific behaviors that could be improved.    Nutrition Goals Re-Evaluation:  Nutrition Goals Re-Evaluation     Row Name 06/11/22 1533             Goals   Current Weight 144 lb 13.5 oz (65.7 kg)       Comment GFR 19, Cr 2.97       Expected Outcome [name] reports eating a wide variety of foods and eating "like a horse" and following a low sodium diet. He was diagnosed with pneumonia on 06/01/22; he is down 15# since his orientation date. In speaking with his wife, she wonders if some of the weight discrepancy is fluid. Will continue to monitor weight. His wife is very supportive. He continues regular follow-up with nephrology. Patient will benefit from participation in intensive cardiac rehab for nutrition, exercise, and lifestyle modification.  Nutrition Goals Re-Evaluation:  Nutrition Goals Re-Evaluation     Row Name 06/11/22 1533             Goals   Current Weight 144 lb 13.5 oz (65.7 kg)       Comment GFR 19, Cr 2.97       Expected Outcome [name] reports eating a wide variety of foods and eating "like a horse" and following a low sodium diet. He was diagnosed with pneumonia on  06/01/22; he is down 15# since his orientation date. In speaking with his wife, she wonders if some of the weight discrepancy is fluid. Will continue to monitor weight. His wife is very supportive. He continues regular follow-up with nephrology. Patient will benefit from participation in intensive cardiac rehab for nutrition, exercise, and lifestyle modification.                Nutrition Goals Discharge (Final Nutrition Goals Re-Evaluation):  Nutrition Goals Re-Evaluation - 06/11/22 1533       Goals   Current Weight 144 lb 13.5 oz (65.7 kg)    Comment GFR 19, Cr 2.97    Expected Outcome [name] reports eating a wide variety of foods and eating "like a horse" and following a low sodium diet. He was diagnosed with pneumonia on 06/01/22; he is down 15# since his orientation date. In speaking with his wife, she wonders if some of the weight discrepancy is fluid. Will continue to monitor weight. His wife is very supportive. He continues regular follow-up with nephrology. Patient will benefit from participation in intensive cardiac rehab for nutrition, exercise, and lifestyle modification.             Psychosocial: Target Goals: Acknowledge presence or absence of significant depression and/or stress, maximize coping skills, provide positive support system. Participant is able to verbalize types and ability to use techniques and skills needed for reducing stress and depression.  Initial Review & Psychosocial Screening:  Initial Psych Review & Screening - 05/17/22 1405       Initial Review   Current issues with Current Stress Concerns    Source of Stress Concerns Chronic Illness;Unable to participate in former interests or hobbies;Unable to perform yard/household activities    Comments [name] says he cant do alot due to shortness of breath and visual impairement. [name] says he is loosing his sight, his wife drives for him      Family Dynamics   Good Support System? Yes   [name] has his wife for  support, they also have neighbors, neices and nephews who live in [place]     Barriers   Psychosocial barriers to participate in program The patient should benefit from training in stress management and relaxation.      Screening Interventions   Interventions Encouraged to exercise;Provide feedback about the scores to participant    Expected Outcomes Long Term goal: The participant improves quality of Life and PHQ9 Scores as seen by post scores and/or verbalization of changes             Quality of Life Scores:  Quality of Life - 05/17/22 1557       Quality of Life   Select Quality of Life      Quality of Life Scores   Health/Function Pre 21.23 %    Socioeconomic Pre 30 %    Psych/Spiritual Pre 30 %    Family Pre 30 %    GLOBAL Pre 26.2 %  Scores of 19 and below usually indicate a poorer quality of life in these areas.  A difference of  2-3 points is a clinically meaningful difference.  A difference of 2-3 points in the total score of the Quality of Life Index has been associated with significant improvement in overall quality of life, self-image, physical symptoms, and general health in studies assessing change in quality of life.  PHQ-9: Review Flowsheet  More data exists      05/17/2022 03/09/2022 11/03/2021 08/02/2021 07/28/2021  Depression screen PHQ 2/9  Decreased Interest 0 0 0 1 0 0 0  Down, Depressed, Hopeless 0 0 0 0 0 0 0  PHQ - 2 Score 0 0 0 1 0 0 0  Altered sleeping 0 - - 3 -  Tired, decreased energy 3 - - 2 -  Change in appetite 0 - - 0 -  Feeling bad or failure about yourself  1 - - 0 -  Trouble concentrating 0 - - 0 -  Moving slowly or fidgety/restless 1 - - 1 -  Suicidal thoughts 0 - - 0 -  PHQ-9 Score 5 - - 7 -  Difficult doing work/chores Not difficult at all - - Somewhat difficult -   Interpretation of Total Score  Total Score Depression Severity:  1-4 = Minimal depression, 5-9 = Mild depression, 10-14 = Moderate depression,  15-19 = Moderately severe depression, 20-27 = Severe depression   Psychosocial Evaluation and Intervention:   Psychosocial Re-Evaluation:  Psychosocial Re-Evaluation     Row Name 06/11/22 1718             Psychosocial Re-Evaluation   Current issues with Current Stress Concerns       Comments [name] does have poor vision assistance provided getting on and off equipment       Expected Outcomes [name] will have controlled or decreased stress upon completion of intensive cardiac rehab       Interventions Stress management education;Relaxation education;Encouraged to attend Cardiac Rehabilitation for the exercise       Continue Psychosocial Services  Follow up required by staff         Initial Review   Source of Stress Concerns Chronic Illness;Unable to perform yard/household activities;Unable to participate in former interests or hobbies       Comments Will continue to monitor and offer support as needed                Psychosocial Discharge (Final Psychosocial Re-Evaluation):  Psychosocial Re-Evaluation - 06/11/22 1718       Psychosocial Re-Evaluation   Current issues with Current Stress Concerns    Comments [name] does have poor vision assistance provided getting on and off equipment    Expected Outcomes [name] will have controlled or decreased stress upon completion of intensive cardiac rehab    Interventions Stress management education;Relaxation education;Encouraged to attend Cardiac Rehabilitation for the exercise    Continue Psychosocial Services  Follow up required by staff      Initial Review   Source of Stress Concerns Chronic Illness;Unable to perform yard/household activities;Unable to participate in former interests or hobbies    Comments Will continue to monitor and offer support as needed             Vocational Rehabilitation: Provide vocational rehab assistance to qualifying candidates.   Vocational Rehab Evaluation & Intervention:  Vocational Rehab -  05/17/22 1408       Initial Vocational Rehab Evaluation & Intervention   Assessment shows need for  Vocational Rehabilitation No   [name] is retired and does not need vocational rehab at this time            Education: Education Goals: Education classes will be provided on a weekly basis, covering required topics. Participant will state understanding/return demonstration of topics presented.    Education     Row Name 06/06/22 1500     Education   Cardiac Education Topics Pritikin   Customer service manager   Weekly Topic Adding Flavor - Sodium-Free   Instruction Review Code 1- Verbalizes Understanding   Class Start Time 1400   Class Stop Time 1445   Class Time Calculation (min) 45 min    Row Name 06/11/22 1600     Education   Cardiac Education Topics Pritikin   [employer] Workshops     Workshops   Educator Exercise Physiologist   Select Exercise   Exercise Workshop Location manager and Fall Prevention   Instruction Review Code 1- Verbalizes Understanding   Class Start Time 1405   Class Stop Time 1445   Class Time Calculation (min) 40 min            Core Videos: Exercise    Move It!  Clinical staff conducted group or individual video education with verbal and written material and guidebook.  Patient learns the recommended Pritikin exercise program. Exercise with the goal of living a long, healthy life. Some of the health benefits of exercise include controlled diabetes, healthier blood pressure levels, improved cholesterol levels, improved heart and lung capacity, improved sleep, and better body composition. Everyone should speak with their doctor before starting or changing an exercise routine.  Biomechanical Limitations Clinical staff conducted group or individual video education with verbal and written material and guidebook.  Patient learns how biomechanical limitations can impact exercise and how we can mitigate and  possibly overcome limitations to have an impactful and balanced exercise routine.  Body Composition Clinical staff conducted group or individual video education with verbal and written material and guidebook.  Patient learns that body composition (ratio of muscle mass to fat mass) is a key component to assessing overall fitness, rather than body weight alone. Increased fat mass, especially visceral belly fat, can put [place] at increased risk for metabolic syndrome, type 2 diabetes, heart disease, and even death. It is recommended to combine diet and exercise (cardiovascular and resistance training) to improve your body composition. Seek guidance from your physician and exercise physiologist before implementing an exercise routine.  Exercise Action Plan Clinical staff conducted group or individual video education with verbal and written material and guidebook.  Patient learns the recommended strategies to achieve and enjoy long-term exercise adherence, including variety, self-motivation, self-efficacy, and positive decision making. Benefits of exercise include fitness, good health, weight management, more energy, better sleep, less stress, and overall well-being.  Medical   Heart Disease Risk Reduction Clinical staff conducted group or individual video education with verbal and written material and guidebook.  Patient learns our heart is our most vital organ as it circulates oxygen, nutrients, white blood cells, and hormones throughout the entire body, and carries waste away. Data supports a plant-based eating plan like the Pritikin Program for its effectiveness in slowing progression of and reversing heart disease. The video provides a number of recommendations to address heart disease.   Metabolic Syndrome and Belly Fat  Clinical staff conducted group or individual video education with verbal and written material and guidebook.  Patient learns what metabolic syndrome is, how it leads to heart disease,  and how one can reverse it and keep it from coming back. You have metabolic syndrome if you have 3 of the following 5 criteria: abdominal obesity, high blood pressure, high triglycerides, low HDL cholesterol, and high blood sugar.  Hypertension and Heart Disease Clinical staff conducted group or individual video education with verbal and written material and guidebook.  Patient learns that high blood pressure, or hypertension, is very common in the [place]. Hypertension is largely due to excessive salt intake, but other important risk factors include being overweight, physical inactivity, drinking too much alcohol, smoking, and not eating enough potassium from fruits and vegetables. High blood pressure is a leading risk factor for heart attack, stroke, congestive heart failure, dementia, kidney failure, and premature death. Long-term effects of excessive salt intake include stiffening of the arteries and thickening of heart muscle and organ damage. Recommendations include ways to reduce hypertension and the risk of heart disease.  Diseases of Our Time - Focusing on Diabetes Clinical staff conducted group or individual video education with verbal and written material and guidebook.  Patient learns why the best way to stop diseases of our time is prevention, through food and other lifestyle changes. Medicine (such as prescription pills and surgeries) is often only a Band-Aid on the problem, not a long-term solution. Most common diseases of our time include obesity, type 2 diabetes, hypertension, heart disease, and cancer. The Pritikin Program is recommended and has been proven to help reduce, reverse, and/or prevent the damaging effects of metabolic syndrome.  Nutrition   Overview of the Pritikin Eating Plan  Clinical staff conducted group or individual video education with verbal and written material and guidebook.  Patient learns about the Pritikin Eating Plan for disease risk reduction. The  Pritikin Eating Plan emphasizes a wide variety of unrefined, minimally-processed carbohydrates, like fruits, vegetables, whole grains, and legumes. Go, Caution, and Stop food choices are explained. Plant-based and lean animal proteins are emphasized. Rationale provided for low sodium intake for blood pressure control, low added sugars for blood sugar stabilization, and low added fats and oils for coronary artery disease risk reduction and weight management.  Calorie Density  Clinical staff conducted group or individual video education with verbal and written material and guidebook.  Patient learns about calorie density and how it impacts the Pritikin Eating Plan. Knowing the characteristics of the food you choose will help you decide whether those foods will lead to weight gain or weight loss, and whether you want to consume more or less of them. Weight loss is usually a side effect of the Pritikin Eating Plan because of its focus on low calorie-dense foods.  Label Reading  Clinical staff conducted group or individual video education with verbal and written material and guidebook.  Patient learns about the Pritikin recommended label reading guidelines and corresponding recommendations regarding calorie density, added sugars, sodium content, and whole grains.  Dining Out - Part 1  Clinical staff conducted group or individual video education with verbal and written material and guidebook.  Patient learns that restaurant meals can be sabotaging because they can be so high in calories, fat, sodium, and/or sugar. Patient learns recommended strategies on how to positively address this and avoid unhealthy pitfalls.  Facts on Fats  Clinical staff conducted group or individual video education with verbal and written material and guidebook.  Patient learns that lifestyle modifications can be just as effective, if not more so, as  many medications for lowering your risk of heart disease. A Pritikin lifestyle can  help to reduce your risk of inflammation and atherosclerosis (cholesterol build-up, or plaque, in the artery walls). Lifestyle interventions such as dietary choices and physical activity address the cause of atherosclerosis. A review of the types of fats and their impact on blood cholesterol levels, along with dietary recommendations to reduce fat intake is also included.  Nutrition Action Plan  Clinical staff conducted group or individual video education with verbal and written material and guidebook.  Patient learns how to incorporate Pritikin recommendations into their lifestyle. Recommendations include planning and keeping personal health goals in mind as an important part of their success.  Healthy Mind-Set    Healthy Minds, Bodies, Hearts  Clinical staff conducted group or individual video education with verbal and written material and guidebook.  Patient learns how to identify when they are stressed. Video will discuss the impact of that stress, as well as the many benefits of stress management. Patient will also be introduced to stress management techniques. The way we think, act, and feel has an impact on our hearts.  How Our Thoughts Can Heal Our Hearts  Clinical staff conducted group or individual video education with verbal and written material and guidebook.  Patient learns that negative thoughts can cause depression and anxiety. This can result in negative lifestyle behavior and serious health problems. Cognitive behavioral therapy is an effective method to help control our thoughts in order to change and improve our emotional outlook.  Additional Videos:  Exercise    Improving Performance  Clinical staff conducted group or individual video education with verbal and written material and guidebook.  Patient learns to use a non-linear approach by alternating intensity levels and lengths of time spent exercising to help burn more calories and lose more body fat. Cardiovascular exercise  helps improve heart health, metabolism, hormonal balance, blood sugar control, and recovery from fatigue. Resistance training improves strength, endurance, balance, coordination, reaction time, metabolism, and muscle mass. Flexibility exercise improves circulation, posture, and balance. Seek guidance from your physician and exercise physiologist before implementing an exercise routine and learn your capabilities and proper form for all exercise.  Introduction to Yoga  Clinical staff conducted group or individual video education with verbal and written material and guidebook.  Patient learns about yoga, a discipline of the coming together of mind, breath, and body. The benefits of yoga include improved flexibility, improved range of motion, better posture and core strength, increased lung function, weight loss, and positive self-image. Yoga's heart health benefits include lowered blood pressure, healthier heart rate, decreased cholesterol and triglyceride levels, improved immune function, and reduced stress. Seek guidance from your physician and exercise physiologist before implementing an exercise routine and learn your capabilities and proper form for all exercise.  Medical   Aging: Enhancing Your Quality of Life  Clinical staff conducted group or individual video education with verbal and written material and guidebook.  Patient learns key strategies and recommendations to stay in good physical health and enhance quality of life, such as prevention strategies, having an advocate, securing a Health Care Proxy and Power of Attorney, and keeping a list of medications and system for tracking them. It also discusses how to avoid risk for bone loss.  Biology of Weight Control  Clinical staff conducted group or individual video education with verbal and written material and guidebook.  Patient learns that weight gain occurs because we consume more calories than we burn (eating more, moving less).  Even if  your body weight is normal, you may have higher ratios of fat compared to muscle mass. Too much body fat puts you at increased risk for cardiovascular disease, heart attack, stroke, type 2 diabetes, and obesity-related cancers. In addition to exercise, following the Pritikin Eating Plan can help reduce your risk.  Decoding Lab Results  Clinical staff conducted group or individual video education with verbal and written material and guidebook.  Patient learns that lab test reflects one measurement whose values change over time and are influenced by many factors, including medication, stress, sleep, exercise, food, hydration, pre-existing medical conditions, and more. It is recommended to use the knowledge from this video to become more involved with your lab results and evaluate your numbers to speak with your doctor.   Diseases of Our Time - Overview  Clinical staff conducted group or individual video education with verbal and written material and guidebook.  Patient learns that according to the CDC, 50% to 70% of chronic diseases (such as obesity, type 2 diabetes, elevated lipids, hypertension, and heart disease) are avoidable through lifestyle improvements including healthier food choices, listening to satiety cues, and increased physical activity.  Sleep Disorders Clinical staff conducted group or individual video education with verbal and written material and guidebook.  Patient learns how good quality and duration of sleep are important to overall health and well-being. Patient also learns about sleep disorders and how they impact health along with recommendations to address them, including discussing with a physician.  Nutrition  Dining Out - Part 2 Clinical staff conducted group or individual video education with verbal and written material and guidebook.  Patient learns how to plan ahead and communicate in order to maximize their dining experience in a healthy and nutritious manner.  Included are recommended food choices based on the type of restaurant the patient is visiting.   Fueling a Banker conducted group or individual video education with verbal and written material and guidebook.  There is a strong connection between our food choices and our health. Diseases like obesity and type 2 diabetes are very prevalent and are in large-part due to lifestyle choices. The Pritikin Eating Plan provides plenty of food and hunger-curbing satisfaction. It is easy to follow, affordable, and helps reduce health risks.  Menu Workshop  Clinical staff conducted group or individual video education with verbal and written material and guidebook.  Patient learns that restaurant meals can sabotage health goals because they are often packed with calories, fat, sodium, and sugar. Recommendations include strategies to plan ahead and to communicate with the manager, chef, or server to help order a healthier meal.  Planning Your Eating Strategy  Clinical staff conducted group or individual video education with verbal and written material and guidebook.  Patient learns about the Pritikin Eating Plan and its benefit of reducing the risk of disease. The Pritikin Eating Plan does not focus on calories. Instead, it emphasizes high-quality, nutrient-rich foods. By knowing the characteristics of the foods, we choose, we can determine their calorie density and make informed decisions.  Targeting Your Nutrition Priorities  Clinical staff conducted group or individual video education with verbal and written material and guidebook.  Patient learns that lifestyle habits have a tremendous impact on disease risk and progression. This video provides eating and physical activity recommendations based on your personal health goals, such as reducing LDL cholesterol, losing weight, preventing or controlling type 2 diabetes, and reducing high blood pressure.  Vitamins and Minerals  Clinical  staff  conducted group or individual video education with verbal and written material and guidebook.  Patient learns different ways to obtain key vitamins and minerals, including through a recommended healthy diet. It is important to discuss all supplements you take with your doctor.   Healthy Mind-Set    Smoking Cessation  Clinical staff conducted group or individual video education with verbal and written material and guidebook.  Patient learns that cigarette smoking and tobacco addiction pose a serious health risk which affects millions of people. Stopping smoking will significantly reduce the risk of heart disease, lung disease, and many forms of cancer. Recommended strategies for quitting are covered, including working with your doctor to develop a successful plan.  Culinary   Becoming a Set designer conducted group or individual video education with verbal and written material and guidebook.  Patient learns that cooking at home can be healthy, cost-effective, quick, and puts them in control. Keys to cooking healthy recipes will include looking at your recipe, assessing your equipment needs, planning ahead, making it simple, choosing cost-effective seasonal ingredients, and limiting the use of added fats, salts, and sugars.  Cooking - Breakfast and Snacks  Clinical staff conducted group or individual video education with verbal and written material and guidebook.  Patient learns how important breakfast is to satiety and nutrition through the entire day. Recommendations include key foods to eat during breakfast to help stabilize blood sugar levels and to prevent overeating at meals later in the day. Planning ahead is also a key component.  Cooking - Educational psychologist conducted group or individual video education with verbal and written material and guidebook.  Patient learns eating strategies to improve overall health, including an approach to cook more at home.  Recommendations include thinking of animal protein as a side on your plate rather than center stage and focusing instead on lower calorie dense options like vegetables, fruits, whole grains, and plant-based proteins, such as beans. Making sauces in large quantities to freeze for later and leaving the skin on your vegetables are also recommended to maximize your experience.  Cooking - Healthy Salads and Dressing Clinical staff conducted group or individual video education with verbal and written material and guidebook.  Patient learns that vegetables, fruits, whole grains, and legumes are the foundations of the Pritikin Eating Plan. Recommendations include how to incorporate each of these in flavorful and healthy salads, and how to create homemade salad dressings. Proper handling of ingredients is also covered. Cooking - Soups and [employer] - Soups and Desserts Clinical staff conducted group or individual video education with verbal and written material and guidebook.  Patient learns that Pritikin soups and desserts make for easy, nutritious, and delicious snacks and meal components that are low in sodium, fat, sugar, and calorie density, while high in vitamins, minerals, and filling fiber. Recommendations include simple and healthy ideas for soups and desserts.   Overview     The Pritikin Solution Program Overview Clinical staff conducted group or individual video education with verbal and written material and guidebook.  Patient learns that the results of the Pritikin Program have been documented in more than 100 articles published in peer-reviewed journals, and the benefits include reducing risk factors for (and, in some cases, even reversing) high cholesterol, high blood pressure, type 2 diabetes, obesity, and more! An overview of the three key pillars of the Pritikin Program will be covered: eating well, doing regular exercise, and having a healthy mind-set.  WORKSHOPS   Exercise: Exercise Basics: Building Your Action Plan Clinical staff led group instruction and group discussion with PowerPoint presentation and patient guidebook. To enhance the learning environment the use of posters, models and videos may be added. At the conclusion of this workshop, patients will comprehend the difference between physical activity and exercise, as well as the benefits of incorporating both, into their routine. Patients will understand the FITT (Frequency, Intensity, Time, and Type) principle and how to use it to build an exercise action plan. In addition, safety concerns and other considerations for exercise and cardiac rehab will be addressed by the presenter. The purpose of this lesson is to promote a comprehensive and effective weekly exercise routine in order to improve patients' overall level of fitness.   Managing Heart Disease: Your Path to a Healthier Heart Clinical staff led group instruction and group discussion with PowerPoint presentation and patient guidebook. To enhance the learning environment the use of posters, models and videos may be added.At the conclusion of this workshop, patients will understand the anatomy and physiology of the heart. Additionally, they will understand how Pritikin's three pillars impact the risk factors, the progression, and the management of heart disease.  The purpose of this lesson is to provide a high-level overview of the heart, heart disease, and how the Pritikin lifestyle positively impacts risk factors.  Exercise Biomechanics Clinical staff led group instruction and group discussion with PowerPoint presentation and patient guidebook. To enhance the learning environment the use of posters, models and videos may be added. Patients will learn how the structural parts of their bodies function and how these functions impact their daily activities, movement, and exercise. Patients will learn how to promote a neutral spine, learn how  to manage pain, and identify ways to improve their physical movement in order to promote healthy living. The purpose of this lesson is to expose patients to common physical limitations that impact physical activity. Participants will learn practical ways to adapt and manage aches and pains, and to minimize their effect on regular exercise. Patients will learn how to maintain good posture while sitting, walking, and lifting.  Balance Training and Fall Prevention  Clinical staff led group instruction and group discussion with PowerPoint presentation and patient guidebook. To enhance the learning environment the use of posters, models and videos may be added. At the conclusion of this workshop, patients will understand the importance of their sensorimotor skills (vision, proprioception, and the vestibular system) in maintaining their ability to balance as they age. Patients will apply a variety of balancing exercises that are appropriate for their current level of function. Patients will understand the common causes for poor balance, possible solutions to these problems, and ways to modify their physical environment in order to minimize their fall risk. The purpose of this lesson is to teach patients about the importance of maintaining balance as they age and ways to minimize their risk of falling.  WORKSHOPS   Nutrition:  Fueling a Ship broker led group instruction and group discussion with PowerPoint presentation and patient guidebook. To enhance the learning environment the use of posters, models and videos may be added. Patients will review the foundational principles of the Pritikin Eating Plan and understand what constitutes a serving size in each of the food groups. Patients will also learn Pritikin-friendly foods that are better choices when away from home and review make-ahead meal and snack options. Calorie density will be reviewed and applied to three nutrition priorities:  weight maintenance, weight  loss, and weight gain. The purpose of this lesson is to reinforce (in a group setting) the key concepts around what patients are recommended to eat and how to apply these guidelines when away from home by planning and selecting Pritikin-friendly options. Patients will understand how calorie density may be adjusted for different weight management goals.  Mindful Eating  Clinical staff led group instruction and group discussion with PowerPoint presentation and patient guidebook. To enhance the learning environment the use of posters, models and videos may be added. Patients will briefly review the concepts of the Pritikin Eating Plan and the importance of low-calorie dense foods. The concept of mindful eating will be introduced as well as the importance of paying attention to internal hunger signals. Triggers for non-hunger eating and techniques for dealing with triggers will be explored. The purpose of this lesson is to provide patients with the opportunity to review the basic principles of the Pritikin Eating Plan, discuss the value of eating mindfully and how to measure internal cues of hunger and fullness using the Hunger Scale. Patients will also discuss reasons for non-hunger eating and learn strategies to use for controlling emotional eating.  Targeting Your Nutrition Priorities Clinical staff led group instruction and group discussion with PowerPoint presentation and patient guidebook. To enhance the learning environment the use of posters, models and videos may be added. Patients will learn how to determine their genetic susceptibility to disease by reviewing their family history. Patients will gain insight into the importance of diet as part of an overall healthy lifestyle in mitigating the impact of genetics and other environmental insults. The purpose of this lesson is to provide patients with the opportunity to assess their personal nutrition priorities by looking at  their family history, their own health history and current risk factors. Patients will also be able to discuss ways of prioritizing and modifying the Pritikin Eating Plan for their highest risk areas  Menu  Clinical staff led group instruction and group discussion with PowerPoint presentation and patient guidebook. To enhance the learning environment the use of posters, models and videos may be added. Using menus brought in from [employer], or printed from [employer], patients will apply the Pritikin dining out guidelines that were presented in the [employer] video. Patients will also be able to practice these guidelines in a variety of provided scenarios. The purpose of this lesson is to provide patients with the opportunity to practice hands-on learning of the Pritikin Dining Out guidelines with actual menus and practice scenarios.  Label Reading Clinical staff led group instruction and group discussion with PowerPoint presentation and patient guidebook. To enhance the learning environment the use of posters, models and videos may be added. Patients will review and discuss the Pritikin label reading guidelines presented in [employer]'s Label Reading Educational series video. Using fool labels brought in from local grocery stores and markets, patients will apply the label reading guidelines and determine if the packaged food meet the Pritikin guidelines. The purpose of this lesson is to provide patients with the opportunity to review, discuss, and practice hands-on learning of the Pritikin Label Reading guidelines with actual packaged food labels. Cooking School  Prit[facility]in's [employer] are designed to teach patients ways to prepare quick, simple, and affordable recipes at home. The importance of nutrition's role in chronic disease risk reduction is reflected in its emphasis in the overall Pritikin program. By learning how to prepare essential core Pritikin Eating  Plan recipes, patients will increase control  over what they eat; be able to customize the flavor of foods without the use of added salt, sugar, or fat; and improve the quality of the food they consume. By learning a set of core recipes which are easily assembled, quickly prepared, and affordable, patients are more likely to prepare more healthy foods at home. These workshops focus on convenient breakfasts, simple entres, side dishes, and desserts which can be prepared with minimal effort and are consistent with nutrition recommendations for cardiovascular risk reduction. Cooking [employer] are taught by a Armed forces logistics/support/administrative officer (RD) who has been trained by the [employer]. The chef or RD has a clear understanding of the importance of minimizing - if not completely eliminating - added fat, sugar, and sodium in recipes. Throughout the series of Cooking School Workshop sessions, patients will learn about healthy ingredients and efficient methods of cooking to build confidence in their capability to prepare    Cooking School weekly topics:  Adding Flavor- Sodium-Free  Fast and Healthy Breakfasts  Powerhouse Plant-Based Proteins  Satisfying Salads and Dressings  Simple Sides and Sauces  International Cuisine-Spotlight on the [employer] Zones  Delicious Desserts  Savory Soups  [employer] - Meals in a Astronomer Appetizers and Snacks  Comforting Weekend Breakfasts  One-Pot Wonders   Fast Evening Meals  Landscape architect Your Pritikin Plate  WORKSHOPS   Healthy Mindset (Psychosocial):  Focused Goals, Sustainable Changes Clinical staff led group instruction and group discussion with PowerPoint presentation and patient guidebook. To enhance the learning environment the use of posters, models and videos may be added. Patients will be able to apply effective goal setting strategies to establish at least one personal goal, and then take consistent, meaningful  action toward that goal. They will learn to identify common barriers to achieving personal goals and develop strategies to overcome them. Patients will also gain an understanding of how our mind-set can impact our ability to achieve goals and the importance of cultivating a positive and growth-oriented mind-set. The purpose of this lesson is to provide patients with a deeper understanding of how to set and achieve personal goals, as well as the tools and strategies needed to overcome common obstacles which may arise along the way.  From Head to Heart: The Power of a Healthy Outlook  Clinical staff led group instruction and group discussion with PowerPoint presentation and patient guidebook. To enhance the learning environment the use of posters, models and videos may be added. Patients will be able to recognize and describe the impact of emotions and mood on physical health. They will discover the importance of self-care and explore self-care practices which may work for them. Patients will also learn how to utilize the 4 C's to cultivate a healthier outlook and better manage stress and challenges. The purpose of this lesson is to demonstrate to patients how a healthy outlook is an essential part of maintaining good health, especially as they continue their cardiac rehab journey.  Healthy Sleep for a Healthy Heart Clinical staff led group instruction and group discussion with PowerPoint presentation and patient guidebook. To enhance the learning environment the use of posters, models and videos may be added. At the conclusion of this workshop, patients will be able to demonstrate knowledge of the importance of sleep to overall health, well-being, and quality of life. They will understand the symptoms of, and treatments for, common sleep disorders. Patients will also be able to identify daytime and nighttime behaviors which impact sleep,  and they will be able to apply these tools to help manage sleep-related  challenges. The purpose of this lesson is to provide patients with a general overview of sleep and outline the importance of quality sleep. Patients will learn about a few of the most common sleep disorders. Patients will also be introduced to the concept of "sleep hygiene," and discover ways to self-manage certain sleeping problems through simple daily behavior changes. Finally, the workshop will motivate patients by clarifying the links between quality sleep and their goals of heart-healthy living.   Recognizing and Reducing Stress Clinical staff led group instruction and group discussion with PowerPoint presentation and patient guidebook. To enhance the learning environment the use of posters, models and videos may be added. At the conclusion of this workshop, patients will be able to understand the types of stress reactions, differentiate between acute and chronic stress, and recognize the impact that chronic stress has on their health. They will also be able to apply different coping mechanisms, such as reframing negative self-talk. Patients will have the opportunity to practice a variety of stress management techniques, such as deep abdominal breathing, progressive muscle relaxation, and/or guided imagery.  The purpose of this lesson is to educate patients on the role of stress in their lives and to provide healthy techniques for coping with it.  Learning Barriers/Preferences:  Learning Barriers/Preferences - 05/17/22 1601       Learning Barriers/Preferences   Learning Barriers Sight;Exercise Concerns;Hearing   Pt is visually impaired, many pain issues, back surgery, assistive device and artritis. [facility]   Learning Preferences Audio;Pictoral;Skilled Demonstration;Verbal Instruction;Video;Written Material;Individual Instruction;Group Instruction;Computer/Internet             Education Topics:  Knowledge Questionnaire Score:  Knowledge Questionnaire Score - 06/06/22 1426       Knowledge  Questionnaire Score   Pre Score 19/24             Core Components/Risk Factors/Patient Goals at Admission:  Personal Goals and Risk Factors at Admission - 05/17/22 1603       Core Components/Risk Factors/Patient Goals on Admission   Hypertension Yes    Intervention Provide education on lifestyle modifcations including regular physical activity/exercise, weight management, moderate sodium restriction and increased consumption of fresh fruit, vegetables, and low fat dairy, alcohol moderation, and smoking cessation.;Monitor prescription use compliance.    Expected Outcomes Short Term: Continued assessment and intervention until BP is < 140/55mm HG in hypertensive participants. < 130/67mm HG in hypertensive participants with diabetes, heart failure or chronic kidney disease.;Long Term: Maintenance of blood pressure at goal levels.    Lipids Yes    Intervention Provide education and support for participant on nutrition & aerobic/resistive exercise along with prescribed medications to achieve LDL 70mg , HDL >40mg .    Expected Outcomes Short Term: Participant states understanding of desired cholesterol values and is compliant with medications prescribed. Participant is following exercise prescription and nutrition guidelines.;Long Term: Cholesterol controlled with medications as prescribed, with individualized exercise RX and with personalized nutrition plan. Value goals: LDL < 70mg , HDL > 40 mg.    Stress Yes    Intervention Offer individual and/or small group education and counseling on adjustment to heart disease, stress management and health-related lifestyle change. Teach and support self-help strategies.;Refer participants experiencing significant psychosocial distress to appropriate mental health specialists for further evaluation and treatment. When possible, include family members and significant others in education/counseling sessions.    Expected Outcomes Short Term: Participant  demonstrates changes in health-related behavior, relaxation and other stress  management skills, ability to obtain effective social support, and compliance with psychotropic medications if prescribed.;Long Term: Emotional wellbeing is indicated by absence of clinically significant psychosocial distress or social isolation.    Personal Goal Other Yes    Personal Goal Short term: increase QOL Long term: stamina    Intervention Will continue to monitor pt and progress workloads as tolerated without sign or symptom    Expected Outcomes Pt will achieve his goals             Core Components/Risk Factors/Patient Goals Review:   Goals and Risk Factor Review     Row Name 06/11/22 1721             Core Components/Risk Factors/Patient Goals Review   Personal Goals Review Weight Management/Obesity;Hypertension;Lipids;Stress       Review [name] started intensive cardiac rehab and did well with exercise for his fitness level. Vital signs were stable. [name] is decondtioned and uses a cane       Expected Outcomes [name] will continue to participate in intensive cardiac rehab for exercise, nutrtiion and lifestyle modifications                Core Components/Risk Factors/Patient Goals at Discharge (Final Review):   Goals and Risk Factor Review - 06/11/22 1721       Core Components/Risk Factors/Patient Goals Review   Personal Goals Review Weight Management/Obesity;Hypertension;Lipids;Stress    Review [name] started intensive cardiac rehab and did well with exercise for his fitness level. Vital signs were stable. [name] is decondtioned and uses a cane    Expected Outcomes [name] will continue to participate in intensive cardiac rehab for exercise, nutrtiion and lifestyle modifications             ITP Comments:  ITP Comments     Row Name 05/17/22 1402 06/11/22 1716         ITP Comments Introduction to Pritikin Education Program/ Intensive Cardiac Rehab. Initial Orientation Packet Reviewed  with the patient and his wife 30 Day ITP Review. Lateef started intensive cardiac rehab on 06/11/22 and did fair with exercise for his fitness level               Comments: See ITP comments.Thayer Headings RN BSN

## 2022-06-12 NOTE — Telephone Encounter (Signed)
Wife was returning call. Please advise ?

## 2022-06-12 NOTE — Telephone Encounter (Signed)
Left voicemail for the patient to call the clinic.

## 2022-06-12 NOTE — Telephone Encounter (Signed)
Pt c/o medication issue:  1. Name of Medication: amLODipine (NORVASC) 5 MG tablet   2. How are you currently taking this medication (dosage and times per day)?   Take 1 tablet (5 mg total) by mouth daily.    3. Are you having a reaction (difficulty breathing--STAT)? No  4. What is your medication issue? She stated there was a prescription put in from above medication at 10mg  instead of the usual 5mg  and she'd like to know the reason for the increase from [place]. Please advise

## 2022-06-13 ENCOUNTER — Encounter (HOSPITAL_COMMUNITY)
Admission: RE | Admit: 2022-06-13 | Discharge: 2022-06-13 | Disposition: A | Discharge status: Home / Self Care | Payer: Medicare Other | Source: Ambulatory Visit | Attending: Cardiovascular Disease | Admitting: Cardiovascular Disease

## 2022-06-13 DIAGNOSIS — Z952 Presence of prosthetic heart valve: Secondary | ICD-10-CM

## 2022-06-15 ENCOUNTER — Encounter (HOSPITAL_COMMUNITY)
Admission: RE | Admit: 2022-06-15 | Discharge: 2022-06-15 | Disposition: A | Discharge status: Home / Self Care | Payer: Medicare Other | Source: Ambulatory Visit | Attending: Cardiovascular Disease | Admitting: Cardiovascular Disease

## 2022-06-15 DIAGNOSIS — Z952 Presence of prosthetic heart valve: Secondary | ICD-10-CM | POA: Diagnosis not present

## 2022-06-18 ENCOUNTER — Encounter (HOSPITAL_COMMUNITY)
Admission: RE | Admit: 2022-06-18 | Discharge: 2022-06-18 | Disposition: A | Discharge status: Home / Self Care | Payer: Medicare Other | Source: Ambulatory Visit | Attending: Cardiovascular Disease | Admitting: Cardiovascular Disease

## 2022-06-18 DIAGNOSIS — Z952 Presence of prosthetic heart valve: Secondary | ICD-10-CM | POA: Diagnosis not present

## 2022-06-20 ENCOUNTER — Encounter (HOSPITAL_COMMUNITY)
Admission: RE | Admit: 2022-06-20 | Discharge: 2022-06-20 | Disposition: A | Discharge status: Home / Self Care | Payer: Medicare Other | Source: Ambulatory Visit | Attending: Nephrology | Admitting: Nephrology

## 2022-06-20 ENCOUNTER — Encounter (HOSPITAL_COMMUNITY)
Admission: RE | Admit: 2022-06-20 | Discharge: 2022-06-20 | Disposition: A | Discharge status: Home / Self Care | Payer: Medicare Other | Source: Ambulatory Visit | Attending: Cardiovascular Disease | Admitting: Cardiovascular Disease

## 2022-06-20 VITALS — BP 99/70 | HR 70 | Temp 97.9°F | Resp 16

## 2022-06-20 DIAGNOSIS — Z952 Presence of prosthetic heart valve: Secondary | ICD-10-CM

## 2022-06-20 DIAGNOSIS — N183 Chronic kidney disease, stage 3 unspecified: Secondary | ICD-10-CM

## 2022-06-20 LAB — RENAL FUNCTION PANEL
Albumin: 3.8 g/dL (ref 3.5–5.0)
Anion gap: 14 (ref 5–15)
BUN: 72 mg/dL — ABNORMAL HIGH (ref 8–23)
CO2: 26 mmol/L (ref 22–32)
Calcium: 10.1 mg/dL (ref 8.9–10.3)
Chloride: 97 mmol/L — ABNORMAL LOW (ref 98–111)
Creatinine, Ser: 2.54 mg/dL — ABNORMAL HIGH (ref 0.61–1.24)
GFR, Estimated: 23 mL/min — ABNORMAL LOW (ref >60)
Glucose, Bld: 96 mg/dL (ref 70–99)
Phosphorus: 3.2 mg/dL (ref 2.5–4.6)
Potassium: 3.7 mmol/L (ref 3.5–5.1)
Sodium: 137 mmol/L (ref 135–145)

## 2022-06-20 LAB — IRON AND TIBC
Iron: 53 ug/dL (ref 45–182)
Saturation Ratios: 19 % (ref 17.9–39.5)
TIBC: 276 ug/dL (ref 250–450)
UIBC: 223 ug/dL

## 2022-06-20 LAB — FERRITIN: Ferritin: 200 ng/mL (ref 24–336)

## 2022-06-20 LAB — POCT HEMOGLOBIN-HEMACUE: Hemoglobin: 10.8 g/dL — ABNORMAL LOW (ref 13.0–17.0)

## 2022-06-20 MED ORDER — EPOETIN ALFA-EPBX 10000 UNIT/ML IJ SOLN
INTRAMUSCULAR | Status: AC
  Filled 2022-06-20: qty 2

## 2022-06-20 MED ORDER — EPOETIN ALFA-EPBX 10000 UNIT/ML IJ SOLN
20000.0000 [IU] | INTRAMUSCULAR | Status: DC
  Administered 2022-06-20: 20000 [IU] via SUBCUTANEOUS

## 2022-06-21 ENCOUNTER — Ambulatory Visit (INDEPENDENT_AMBULATORY_CARE_PROVIDER_SITE_OTHER): Payer: Medicare Other | Admitting: *Deleted

## 2022-06-21 DIAGNOSIS — E538 Deficiency of other specified B group vitamins: Secondary | ICD-10-CM

## 2022-06-21 LAB — PTH, INTACT AND CALCIUM
Calcium, Total (PTH): 9.9 mg/dL (ref 8.6–10.2)
PTH: 17 pg/mL (ref 15–65)

## 2022-06-21 MED ORDER — CYANOCOBALAMIN 1000 MCG/ML IJ SOLN
1000.0000 ug | Freq: Once | INTRAMUSCULAR | Status: AC
Start: 2022-06-21 — End: 2022-06-21
  Administered 2022-06-21: 1000 ug via INTRAMUSCULAR

## 2022-06-21 NOTE — Progress Notes (Signed)
Pls cosign for B12 inj in absence of pcp.../lmb   

## 2022-06-22 ENCOUNTER — Encounter (HOSPITAL_COMMUNITY)
Admission: RE | Admit: 2022-06-22 | Discharge: 2022-06-22 | Disposition: A | Discharge status: Home / Self Care | Payer: Medicare Other | Source: Ambulatory Visit | Attending: Cardiovascular Disease | Admitting: Cardiovascular Disease

## 2022-06-22 DIAGNOSIS — Z952 Presence of prosthetic heart valve: Secondary | ICD-10-CM

## 2022-06-25 ENCOUNTER — Encounter (HOSPITAL_COMMUNITY): Payer: Medicare Other

## 2022-06-27 ENCOUNTER — Encounter (HOSPITAL_COMMUNITY)
Admission: RE | Admit: 2022-06-27 | Discharge: 2022-06-27 | Disposition: A | Discharge status: Home / Self Care | Payer: Medicare Other | Source: Ambulatory Visit | Attending: Cardiovascular Disease | Admitting: Cardiovascular Disease

## 2022-06-27 DIAGNOSIS — Z952 Presence of prosthetic heart valve: Secondary | ICD-10-CM | POA: Diagnosis not present

## 2022-06-29 ENCOUNTER — Encounter (HOSPITAL_COMMUNITY)
Admission: RE | Admit: 2022-06-29 | Discharge: 2022-06-29 | Disposition: A | Discharge status: Home / Self Care | Payer: Medicare Other | Source: Ambulatory Visit | Attending: Cardiovascular Disease | Admitting: Cardiovascular Disease

## 2022-06-29 DIAGNOSIS — Z952 Presence of prosthetic heart valve: Secondary | ICD-10-CM

## 2022-06-30 ENCOUNTER — Other Ambulatory Visit: Payer: Self-pay | Admitting: Internal Medicine

## 2022-06-30 MED ORDER — FUROSEMIDE 40 MG PO TABS: ORAL_TABLET | ORAL | 3 refills | Status: DC

## 2022-06-30 NOTE — Progress Notes (Signed)
Wife wrote a note stated because of his swelling is sometimes needing to give him an extra pill some days - running out of pills - rx adjusted and sent to pharmacy

## 2022-07-02 ENCOUNTER — Encounter (HOSPITAL_COMMUNITY)
Admission: RE | Admit: 2022-07-02 | Discharge: 2022-07-02 | Disposition: A | Discharge status: Home / Self Care | Payer: Medicare Other | Source: Ambulatory Visit | Attending: Cardiovascular Disease | Admitting: Cardiovascular Disease

## 2022-07-02 DIAGNOSIS — Z952 Presence of prosthetic heart valve: Secondary | ICD-10-CM

## 2022-07-03 ENCOUNTER — Encounter (HOSPITAL_COMMUNITY)
Admission: RE | Admit: 2022-07-03 | Discharge: 2022-07-03 | Disposition: A | Discharge status: Home / Self Care | Payer: Medicare Other | Source: Ambulatory Visit | Attending: Urology | Admitting: Urology

## 2022-07-03 DIAGNOSIS — R9721 Rising PSA following treatment for malignant neoplasm of prostate: Secondary | ICD-10-CM | POA: Insufficient documentation

## 2022-07-03 MED ORDER — PIFLIFOLASTAT F 18 (PYLARIFY) INJECTION
9.0000 | Freq: Once | INTRAVENOUS | Status: AC
  Administered 2022-07-03: 8.8 via INTRAVENOUS

## 2022-07-04 ENCOUNTER — Encounter (HOSPITAL_COMMUNITY)
Admission: RE | Admit: 2022-07-04 | Discharge: 2022-07-04 | Disposition: A | Discharge status: Home / Self Care | Payer: Medicare Other | Source: Ambulatory Visit | Attending: Cardiovascular Disease | Admitting: Cardiovascular Disease

## 2022-07-04 DIAGNOSIS — Z952 Presence of prosthetic heart valve: Secondary | ICD-10-CM

## 2022-07-05 NOTE — Progress Notes (Signed)
Cardiac Individual Treatment Plan  Patient Details  Name: [name] MRN: [id number] Date of Birth: 05/11/1926 Referring Provider:   Flowsheet Row INTENSIVE CARDIAC REHAB ORIENT from 05/17/2022 in [facility] for Heart, Vascular, & Lung Health  Referring Provider Dr. Tonny Bollman, MD       Initial Encounter Date:  Flowsheet Row INTENSIVE CARDIAC REHAB ORIENT from 05/17/2022 in [facility] for Heart, Vascular, & Lung Health  Date 05/17/22       Visit Diagnosis: 02/06/22 S/P TAVR (transcatheter aortic valve replacement)  Patient's Home Medications on Admission:  Current Outpatient Medications:    acetaminophen (TYLENOL) 500 MG tablet, Take 1,000 mg by mouth 3 (three) times daily as needed for moderate pain., Disp: , Rfl:    allopurinol (ZYLOPRIM) 100 MG tablet, TAKE 1 TABLET(100 MG) BY MOUTH DAILY (Patient taking differently: Take 100 mg by mouth daily. TAKE 1 TABLET(100 MG) BY MOUTH DAILY), Disp: 30 tablet, Rfl: 0   amLODipine (NORVASC) 5 MG tablet, Take 1 tablet (5 mg total) by mouth daily., Disp: 90 tablet, Rfl: 1   amoxicillin (AMOXIL) 500 MG capsule, Take 2,000 mg by mouth as directed. 1 HOUR PRIOR TO DENTAL CLEANINGS AND PROCEDURES, Disp: , Rfl:    Aromatic Inhalants (VICKS VAPOR INHALER IN), Place 1 puff into both nostrils as needed (for congestion)., Disp: , Rfl:    Ascorbic Acid (VITAMIN C) 1000 MG tablet, Take 1,000 mg by mouth daily., Disp: , Rfl:    aspirin EC 81 MG tablet, Take 81 mg by mouth daily., Disp: , Rfl:    Calcium Citrate-Vitamin D (CITRACAL + D PO), Take 2 tablets by mouth in the morning and at bedtime., Disp: , Rfl:    Carboxymeth-Glycerin-Polysorb (REFRESH OPTIVE MEGA-3 OP), Place 1 drop into both eyes 2 (two) times daily., Disp: , Rfl:    carvedilol (COREG) 3.125 MG tablet, Take 1 tablet (3.125 mg total) by mouth 2 (two) times daily., Disp: 180 tablet, Rfl: 3   Cholecalciferol 25 MCG (1000 UT) capsule,  Take 1,000 Units by mouth daily., Disp: , Rfl:    cyanocobalamin (,VITAMIN B-12,) 1000 MCG/ML injection, Inject 1,000 mcg into the muscle once. Monthly injection, Disp: , Rfl:    folic acid (FOLVITE) 1 MG tablet, Take 1 tablet (1 mg total) by mouth daily. Annual appt due in May must see provider for future refills (Patient taking differently: Take 1 mg by mouth at bedtime. Annual appt due in May must see provider for future refills), Disp: 90 tablet, Rfl: 2   furosemide (LASIX) 40 MG tablet, Take 1 tablet by mouth every Monday, Wednesday, and Friday.  Take an extra pill three times per week prn, Disp: 72 tablet, Rfl: 3   hydrALAZINE (APRESOLINE) 100 MG tablet, Take 1 tablet (100 mg total) by mouth 3 (three) times daily., Disp: 90 tablet, Rfl: 5   loratadine (CLARITIN) 10 MG tablet, Take 10 mg by mouth daily as needed (for seasonal allergies)., Disp: , Rfl:    nitroGLYCERIN (NITROSTAT) 0.4 MG SL tablet, DISSOLVE 1 TABLET UNDER THE TONGUE EVERY 5 MINUTES AS NEEDED FOR CHEST PAIN, MAXIMUM 3 TABLETS, Disp: 100 tablet, Rfl: 1   Saline (AYR NASAL MIST ALLERGY/SINUS NA), Place 2 sprays into the nose as needed (dryness)., Disp: , Rfl:    trolamine salicylate (ASPERCREME) 10 % cream, Apply 1 Application topically in the morning and at bedtime. For leg cramps, Disp: , Rfl:    Vitamin D, Ergocalciferol, (DRISDOL) 1.25 MG (50000 UNIT)  CAPS capsule, Take 50,000 Units by mouth every 30 (thirty) days. 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 S/P stenting to mid RCA, prox PDA 06/1999. b. NSTEMI/CABG x 3 in 10/2011 with LIMA to LAD, SVG to PDA, and SVG to OM1.     Cephalalgia 07/14/2015   CHB (complete heart block) 05/03/2012   Overview:  Status post complete heart block heart rate 28 bpm, alternating with 2 to one AV block and drug for bradycardia.  Status post pacemaker implantation.status post Medtronic pacemaker implantation the 03/28/2012   Chronic diastolic CHF (congestive heart failure) 02/05/2018   Chronic gouty arthritis    Chronic UTI    a. Followed by Dr. [name] Caprice - colonized/asymptomatic - not on abx   CKD (chronic kidney disease)    stage 3, GFR 30-59 ml/min; stable with a creatinine around 1.9-2.0 followed by nephrology.   Constipation 03/01/2015   Symptoms and exam consistent with constipation. Abdominal exam is benign with no evidence of pain or obstruction. Discussed importance of increasing fiber and water intake coupled with physical activity to assist with bowel movements. Continue over-the-counter medication management as needed. Follow-up if symptoms worsen or fail to improve.   Coronary atherosclerosis 11/27/2011   Overview:  Multivessel coronary artery disease recently diagnosed by catheterization 2013 History of stent placement with a heparin-coated stent 2001  Last Assessment & Plan:  Status post coronary bypass grafting.  No recurrent chest pain. Overview:  Overview:  S/P stenting to mid RCA, prox PDA 06/1999;  06/2007 Myoview: negative except for apical thinning, EF 68%, last Myoview August 10, 2008 with n   Degeneration of lumbar intervertebral disc    Degenerative disc disease, cervical, with radiculopathy 09/21/2008   Diastolic dysfunction 07/04/2017   Grade 1 DD on Echo 06/2017   Dyslipidemia    Dysphagia 02/06/2016   3/18 - DG Esophagus:  1. Mild esophageal dysmotility, likely presbyesophagus. 2. No other explanation for patient's symptoms. 3. Small hiatal hernia.  On swallow eval - evidence of cervical spine disease and that was likely contributing   Epistaxis 12/08/2020   Essential hypertension 07/20/2006   GERD  (gastroesophageal reflux disease)    Gout 05/12/2018   Gouty arthritis of right great toe 09/03/2017   Left toe Injected January 31, 2018    Hyperpigmentation 11/04/2017   Internal hemorrhoids 08/15/2016   Left inguinal hernia 07/01/2019   Lumbar post-laminectomy syndrome 12/12/2017   Moderate aortic regurgitation 07/04/2017   Echo 06/2017:  EF 55-60%, mild LVH, grade 1 DD, mild AS, mod AR, mild MR, mild-mod TR   Neck injury    a. 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  This was switched to by mouth amiodarone at the time of the thoracic surgery appointment was discontinued.   Presence of aortocoronary bypass  graft 10/24/2011   Overview:  Performed at [facility] 2013 Last Assessment & Plan:  No complication post coronary bypass grafting.   Prostate cancer (HCC)    a. 2001 s/p TURP.   Pulmonary nodule    a. felt to be noncancerous.  Status post followup CT scan 4 mm and stable.   Renal artery stenosis    a. 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 Excell Seltzer & Dr. 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   Trochanteric bursitis of hip, bilateral 01/10/2017   UGIB (upper gastrointestinal bleed) 02/01/2018   EGD  02/06/18 - mod, non-erosive gastritis   Venous insufficiency of leg 06/05/2010   Vitamin B12  deficiency 08/23/2009   Jan '14  July '14 B12 level  >1500    472   Weakness of both lower extremities 04/09/2020    Tobacco Use: Social History   Tobacco Use  Smoking Status Never  Smokeless Tobacco Never    Labs: Review Flowsheet  More data exists      Latest Ref Rng & Units 08/31/2014 09/01/2015 09/04/2016 02/06/2022 03/02/2022  Labs for ITP Cardiac and Pulmonary Rehab  Cholestrol 0 - 200 mg/dL 161  096  045  - -  LDL (calc) 0 - 99 mg/dL 56  70  70  - -  HDL-C >39.00 mg/dL 40.98  11.91  47.82  - -  Trlycerides 0.0 - 149.0 mg/dL 95.6  213.0  86.5  - -  PH, Arterial 7.35 - 7.45 7.35 - 7.45 - - - - 7.410  7.411   PCO2 arterial 32 - 48 mmHg 32 - 48 mmHg - - - - 29.6  29.1   Bicarbonate 20.0 - 28.0 mmol/L 20.0 - 28.0 mmol/L - - - - 18.7  18.5   TCO2 22 - 32 mmol/L 22 - 32 mmol/L - - - 20  20  19    Acid-base deficit 0.0 - 2.0 mmol/L 0.0 - 2.0 mmol/L - - - - 5.0  5.0   O2 Saturation % % - - - - 66  67     Capillary Blood Glucose: Lab Results  Component Value Date   GLUCAP 105 (H) 10/22/2011   GLUCAP 99 10/22/2011   GLUCAP 88 10/22/2011   GLUCAP 144 (H) 10/21/2011   GLUCAP 153 (H) 10/21/2011     Exercise Target Goals: Exercise Program Goal: Individual exercise prescription set using results from initial 6 min walk test and THRR while considering  patient's activity barriers and safety.   Exercise Prescription Goal: Initial exercise prescription builds to 30-45 minutes a day of aerobic activity, 2-3 days per week.  Home exercise guidelines will be given to patient during program as part of exercise prescription that the participant will acknowledge.  Activity Barriers & Risk Stratification:  Activity Barriers & Cardiac Risk Stratification - 05/17/22 1551       Activity Barriers & Cardiac Risk Stratification   Activity Barriers Joint Problems;Assistive Device;Deconditioning;Arthritis;Back Problems;Muscular Weakness;Decreased Ventricular Function;Neck/Spine  Problems;Balance Concerns;History of Falls    Cardiac Risk Stratification High             6 Minute Walk:  6 Minute Walk     Row Name 05/17/22 1543  6 Minute Walk   Phase Initial  Nustep test due to poor vision     Distance 1476 feet     Walk Time 6 minutes     # of Rest Breaks 0     MPH 2.8     METS 2.49     RPE 12     Perceived Dyspnea  0     VO2 Peak 8.72     Symptoms Yes (comment)     Comments 2 mins in decreased SPM due to high HR, asymptomatic. Bilateral knee to foot pain 4/10, resolved with rest. AVG km 0.45, AVE [place]: 28, AVG SPM 86, total steps 519, total feet: 1476     Resting HR 68 bpm     Resting BP 132/74     Resting Oxygen Saturation  99 %     Exercise Oxygen Saturation  during 6 min walk 100 %     Max Ex. HR 117 bpm     Max Ex. BP 154/66     2 Minute Post BP 146/72              Oxygen Initial Assessment:   Oxygen Re-Evaluation:   Oxygen Discharge (Final Oxygen Re-Evaluation):   Initial Exercise Prescription:  Initial Exercise Prescription - 05/17/22 1500       Date of Initial Exercise RX and Referring Provider   Date 05/17/22    Referring Provider Dr. Tonny Bollman, MD    Expected Discharge Date 07/27/22      NuStep   Level 1    SPM 85    Minutes 25    METs 1.9      Prescription Details   Frequency (times per week) 3    Duration Progress to 30 minutes of continuous aerobic without signs/symptoms of physical distress      Intensity   THRR 40-80% of Max Heartrate 51-102    Ratings of Perceived Exertion 11-13    Perceived Dyspnea 0-4      Progression   Progression Continue progressive overload as per policy without signs/symptoms or physical distress.      Resistance Training   Training Prescription Yes    Weight 2    Reps 10-15             Perform Capillary Blood Glucose checks as needed.  Exercise Prescription Changes:   Exercise Prescription Changes     Row Name 07/02/22 1708              Response to Exercise   Blood Pressure (Admit) 114/70       Blood Pressure (Exercise) 122/70       Blood Pressure (Exit) 110/52       Heart Rate (Admit) 73 bpm       Heart Rate (Exercise) 101 bpm       Heart Rate (Exit) 66 bpm       Rating of Perceived Exertion (Exercise) 13       Perceived Dyspnea (Exercise) 0       Symptoms 0       Comments REVD MET's and goals       Duration Progress to 30 minutes of  aerobic without signs/symptoms of physical distress       Intensity THRR unchanged         Progression   Progression Continue to progress workloads to maintain intensity without signs/symptoms of physical distress.       Average METs 2  Resistance Training   Training Prescription Yes       Weight 3 lbs       Reps 10-15       Time 10 Minutes         NuStep   Level 1       SPM 80       Minutes 22       METs 2                Exercise Comments:   Exercise Comments     Row Name 06/11/22 1720 07/02/22 1714         Exercise Comments Pt first day in the CRP2 program. Pt tolerated exercise well with an average MET level of 2.65. Pt is learning her THRR, RPE and ExRx. Off to a great start. Will continue to monitor pt and progress workloads as tolerated without sign or symptom Reviewed MET's and goals. Pt tolerated exercise well with an average MET level of 2.0. Pt says he still has many aches and pains. He has a lot of soreness and issues with his legs and the soles of his feel that limit him, but overall he's doing ok. Encouraged him that he's doing well and is increasing his SPM and time from 20 to 22 mins. He will work toward 25 mins when he feels able. But right now limited by fatigue.               Exercise Goals and Review:   Exercise Goals     Row Name 05/17/22 1554             Exercise Goals   Increase Physical Activity Yes       Intervention Provide advice, education, support and counseling about physical activity/exercise needs.;Develop an  individualized exercise prescription for aerobic and resistive training based on initial evaluation findings, risk stratification, comorbidities and participant's personal goals.       Expected Outcomes Short Term: Attend rehab on a regular basis to increase amount of physical activity.;Long Term: Exercising regularly at least 3-5 days a week.;Long Term: Add in home exercise to make exercise part of routine and to increase amount of physical activity.       Increase Strength and Stamina Yes       Intervention Provide advice, education, support and counseling about physical activity/exercise needs.;Develop an individualized exercise prescription for aerobic and resistive training based on initial evaluation findings, risk stratification, comorbidities and participant's personal goals.       Expected Outcomes Short Term: Increase workloads from initial exercise prescription for resistance, speed, and METs.;Short Term: Perform resistance training exercises routinely during rehab and add in resistance training at home;Long Term: Improve cardiorespiratory fitness, muscular endurance and strength as measured by increased METs and functional capacity ( )       Able to understand and use rate of perceived exertion (RPE) scale Yes       Intervention Provide education and explanation on how to use RPE scale       Expected Outcomes Short Term: Able to use RPE daily in rehab to express subjective intensity level;Long Term:  Able to use RPE to guide intensity level when exercising independently       Knowledge and understanding of Target Heart Rate Range (THRR) Yes       Intervention Provide education and explanation of THRR including how the numbers were predicted and where they are located for reference       Expected Outcomes Short  Term: Able to state/look up THRR;Long Term: Able to use THRR to govern intensity when exercising independently;Short Term: Able to use daily as guideline for intensity in rehab        Understanding of Exercise Prescription Yes       Intervention Provide education, explanation, and written materials on patient's individual exercise prescription       Expected Outcomes Short Term: Able to explain program exercise prescription;Long Term: Able to explain home exercise prescription to exercise independently                Exercise Goals Re-Evaluation :  Exercise Goals Re-Evaluation     Row Name 06/11/22 1720 07/02/22 1711           Exercise Goal Re-Evaluation   Exercise Goals Review Increase Physical Activity;Understanding of Exercise Prescription;Increase Strength and Stamina;Knowledge and understanding of Target Heart Rate Range (THRR);Able to understand and use rate of perceived exertion (RPE) scale Increase Physical Activity;Understanding of Exercise Prescription;Increase Strength and Stamina;Knowledge and understanding of Target Heart Rate Range (THRR);Able to understand and use rate of perceived exertion (RPE) scale      Comments Pt first day in the CRP2 program. Pt tolerated exercise well with an average MET level of 2.65. Pt is learning her THRR, RPE and ExRx. Off to a great start. Reviewed MET's and goals. Pt tolerated exercise well with an average MET level of 2.0. Pt says he still has many aches and pains. He has a lot of soreness and issues with his legs and the soles of his feel that limit him, but overall he's doing ok. Encouraged him that he's doing well and is increasing his SPM and time from 20 to 22 mins. He will work toward 25 mins when he feels able. But right now limited by fatigue.      Expected Outcomes Will continue to monitor pt and progress workloads as tolerated without sign or symptom Will continue to monitor pt and progress workloads as tolerated without sign or symptom               Discharge Exercise Prescription (Final Exercise Prescription Changes):  Exercise Prescription Changes - 07/02/22 1708       Response to Exercise   Blood  Pressure (Admit) 114/70    Blood Pressure (Exercise) 122/70    Blood Pressure (Exit) 110/52    Heart Rate (Admit) 73 bpm    Heart Rate (Exercise) 101 bpm    Heart Rate (Exit) 66 bpm    Rating of Perceived Exertion (Exercise) 13    Perceived Dyspnea (Exercise) 0    Symptoms 0    Comments REVD MET's and goals    Duration Progress to 30 minutes of  aerobic without signs/symptoms of physical distress    Intensity THRR unchanged      Progression   Progression Continue to progress workloads to maintain intensity without signs/symptoms of physical distress.    Average METs 2      Resistance Training   Training Prescription Yes    Weight 3 lbs    Reps 10-15    Time 10 Minutes      NuStep   Level 1    SPM 80    Minutes 22    METs 2             Nutrition:  Target Goals: Understanding of nutrition guidelines, daily intake of sodium 1500mg , cholesterol 200mg , calories 30% from fat and 7% or less from saturated fats, daily to have 5  or more servings of fruits and vegetables.  Biometrics:  Pre Biometrics - 05/17/22 1530       Pre Biometrics   Waist Circumference 38.75 inches    Hip Circumference 37.75 inches    Waist to Hip Ratio 1.03 %    Triceps Skinfold 5 mm    % Body Fat 22.4 %    Grip Strength 30 kg    Flexibility --   Did not attempt due to spinal fusion on hip pain   Single Leg Stand --   Did not attempt due to falls and assistive device             Nutrition Therapy Plan and Nutrition Goals:  Nutrition Therapy & Goals - 06/11/22 1533       Nutrition Therapy   Diet Heart healthy diet      Personal Nutrition Goals   Nutrition Goal Patient to identify strategies for reducing cardiovascular risk by attending the [employer] education and nutrition series weekly.    Personal Goal #2 Patient to identify strategies for weight gain/maintenance of 0.5-2.0# per week.    Comments Obadiah reports eating a wide variety of foods and eating "like a horse". He was  diagnosed with pneumonia on 06/01/22; he is down 15# since his orientation date. In speaking with his wife, she wonders if some of the weight discrepancy is fluid. Will continue to monitor weight. His wife is very supportive. He continues regular follow-up with nephrology. Patient will benefit from participation in intensive cardiac rehab for nutrition, exercise, and lifestyle modification.      Intervention Plan   Intervention Prescribe, educate and counsel regarding individualized specific dietary modifications aiming towards targeted core components such as weight, hypertension, lipid management, diabetes, heart failure and other comorbidities.;Nutrition handout(s) given to patient.    Expected Outcomes Short Term Goal: Understand basic principles of dietary content, such as calories, fat, sodium, cholesterol and nutrients.;Long Term Goal: Adherence to prescribed nutrition plan.             Nutrition Assessments:  Nutrition Assessments - 06/08/22 1416       Rate Your Plate Scores   Pre Score 62            MEDIFICTS Score Key: ?70 Need to make dietary changes  40-70 Heart Healthy Diet ? 40 Therapeutic Level Cholesterol Diet   Flowsheet Row INTENSIVE CARDIAC REHAB from 06/06/2022 in [facility] for Heart, Vascular, & Lung Health  Picture Your Plate Total Score on Admission 62      Picture Your Plate Scores: <13 Unhealthy dietary pattern with much room for improvement. 41-50 Dietary pattern unlikely to meet recommendations for good health and room for improvement. 51-60 More healthful dietary pattern, with some room for improvement.  >60 Healthy dietary pattern, although there may be some specific behaviors that could be improved.    Nutrition Goals Re-Evaluation:  Nutrition Goals Re-Evaluation     Row Name 06/11/22 1533             Goals   Current Weight 144 lb 13.5 oz (65.7 kg)       Comment GFR 19, Cr 2.97       Expected Outcome Cruiz  reports eating a wide variety of foods and eating "like a horse" and following a low sodium diet. He was diagnosed with pneumonia on 06/01/22; he is down 15# since his orientation date. In speaking with his wife, she wonders if some of the weight discrepancy is fluid. Will  continue to monitor weight. His wife is very supportive. He continues regular follow-up with nephrology. Patient will benefit from participation in intensive cardiac rehab for nutrition, exercise, and lifestyle modification.                Nutrition Goals Re-Evaluation:  Nutrition Goals Re-Evaluation     Row Name 06/11/22 1533             Goals   Current Weight 144 lb 13.5 oz (65.7 kg)       Comment GFR 19, Cr 2.97       Expected Outcome Markevis reports eating a wide variety of foods and eating "like a horse" and following a low sodium diet. He was diagnosed with pneumonia on 06/01/22; he is down 15# since his orientation date. In speaking with his wife, she wonders if some of the weight discrepancy is fluid. Will continue to monitor weight. His wife is very supportive. He continues regular follow-up with nephrology. Patient will benefit from participation in intensive cardiac rehab for nutrition, exercise, and lifestyle modification.                Nutrition Goals Discharge (Final Nutrition Goals Re-Evaluation):  Nutrition Goals Re-Evaluation - 06/11/22 1533       Goals   Current Weight 144 lb 13.5 oz (65.7 kg)    Comment GFR 19, Cr 2.97    Expected Outcome Taurean reports eating a wide variety of foods and eating "like a horse" and following a low sodium diet. He was diagnosed with pneumonia on 06/01/22; he is down 15# since his orientation date. In speaking with his wife, she wonders if some of the weight discrepancy is fluid. Will continue to monitor weight. His wife is very supportive. He continues regular follow-up with nephrology. Patient will benefit from participation in intensive cardiac rehab for nutrition,  exercise, and lifestyle modification.             Psychosocial: Target Goals: Acknowledge presence or absence of significant depression and/or stress, maximize coping skills, provide positive support system. Participant is able to verbalize types and ability to use techniques and skills needed for reducing stress and depression.  Initial Review & Psychosocial Screening:  Initial Psych Review & Screening - 05/17/22 1405       Initial Review   Current issues with Current Stress Concerns    Source of Stress Concerns Chronic Illness;Unable to participate in former interests or hobbies;Unable to perform yard/household activities    Comments Sterlyn says he cant do alot due to shortness of breath and visual impairement. Kieffer says he is loosing his sight, his wife drives for him      Family Dynamics   Good Support System? Yes   Mackenzie has his wife for support, they also have neighbors, neices and nephews who live in [place]     Barriers   Psychosocial barriers to participate in program The patient should benefit from training in stress management and relaxation.      Screening Interventions   Interventions Encouraged to exercise;Provide feedback about the scores to participant    Expected Outcomes Long Term goal: The participant improves quality of Life and PHQ9 Scores as seen by post scores and/or verbalization of changes             Quality of Life Scores:  Quality of Life - 05/17/22 1557       Quality of Life   Select Quality of Life      Quality of  Life Scores   Health/Function Pre 21.23 %    Socioeconomic Pre 30 %    Psych/Spiritual Pre 30 %    Family Pre 30 %    GLOBAL Pre 26.2 %            Scores of 19 and below usually indicate a poorer quality of life in these areas.  A difference of  2-3 points is a clinically meaningful difference.  A difference of 2-3 points in the total score of the Quality of Life Index has been associated with significant  improvement in overall quality of life, self-image, physical symptoms, and general health in studies assessing change in quality of life.  PHQ-9: Review Flowsheet  More data exists      05/17/2022 03/09/2022 11/03/2021 08/02/2021 07/28/2021  Depression screen PHQ 2/9  Decreased Interest 0 0 0 1 0 0 0  Down, Depressed, Hopeless 0 0 0 0 0 0 0  PHQ - 2 Score 0 0 0 1 0 0 0  Altered sleeping 0 - - 3 -  Tired, decreased energy 3 - - 2 -  Change in appetite 0 - - 0 -  Feeling bad or failure about yourself  1 - - 0 -  Trouble concentrating 0 - - 0 -  Moving slowly or fidgety/restless 1 - - 1 -  Suicidal thoughts 0 - - 0 -  PHQ-9 Score 5 - - 7 -  Difficult doing work/chores Not difficult at all - - Somewhat difficult -   Interpretation of Total Score  Total Score Depression Severity:  1-4 = Minimal depression, 5-9 = Mild depression, 10-14 = Moderate depression, 15-19 = Moderately severe depression, 20-27 = Severe depression   Psychosocial Evaluation and Intervention:   Psychosocial Re-Evaluation:  Psychosocial Re-Evaluation     Row Name 06/11/22 1718 07/05/22 1342           Psychosocial Re-Evaluation   Current issues with Current Stress Concerns Current Stress Concerns      Comments Jayanthony does have poor vision assistance provided getting on and off equipment Tobias continues to have stress concerns due to his multiple chronic health conditions      Expected Outcomes Zayden will have controlled or decreased stress upon completion of intensive cardiac rehab Braxtyn will have controlled or decreased stress upon completion of intensive cardiac rehab      Interventions Stress management education;Relaxation education;Encouraged to attend Cardiac Rehabilitation for the exercise Stress management education;Relaxation education;Encouraged to attend Cardiac Rehabilitation for the exercise      Continue Psychosocial Services  Follow up required by staff Follow up required by staff        Initial Review    Source of Stress Concerns Chronic Illness;Unable to perform yard/household activities;Unable to participate in former interests or hobbies Chronic Illness;Unable to perform yard/household activities;Unable to participate in former interests or hobbies      Comments Will continue to monitor and offer support as needed Will continue to monitor and offer support as needed               Psychosocial Discharge (Final Psychosocial Re-Evaluation):  Psychosocial Re-Evaluation - 07/05/22 1342       Psychosocial Re-Evaluation   Current issues with Current Stress Concerns    Comments Jermy continues to have stress concerns due to his multiple chronic health conditions    Expected Outcomes Kahn will have controlled or decreased stress upon completion of intensive cardiac rehab    Interventions Stress management education;Relaxation education;Encouraged to attend  Cardiac Rehabilitation for the exercise    Continue Psychosocial Services  Follow up required by staff      Initial Review   Source of Stress Concerns Chronic Illness;Unable to perform yard/household activities;Unable to participate in former interests or hobbies    Comments Will continue to monitor and offer support as needed             Vocational Rehabilitation: Provide vocational rehab assistance to qualifying candidates.   Vocational Rehab Evaluation & Intervention:  Vocational Rehab - 05/17/22 1408       Initial Vocational Rehab Evaluation & Intervention   Assessment shows need for Vocational Rehabilitation No   Javahn is retired and does not need vocational rehab at this time            Education: Education Goals: Education classes will be provided on a weekly basis, covering required topics. Participant will state understanding/return demonstration of topics presented.    Education     Row Name 06/06/22 1500     Education   Cardiac Education Topics Pritikin   Cabin crew   Weekly Topic Adding Flavor - Sodium-Free   Instruction Review Code 1- Verbalizes Understanding   Class Start Time 1400   Class Stop Time 1445   Class Time Calculation (min) 45 min    Row Name 06/11/22 1600     Education   Cardiac Education Topics Pritikin   Select Workshops     Workshops   Educator Exercise Physiologist   Select Exercise   Exercise Workshop Location manager and Fall Prevention   Instruction Review Code 1- Verbalizes Understanding   Class Start Time 1405   Class Stop Time 1445   Class Time Calculation (min) 40 min    Row Name 06/13/22 1500     Education   Cardiac Education Topics Pritikin   Customer service manager   Weekly Topic Fast and Healthy Breakfasts   Instruction Review Code 1- Verbalizes Understanding   Class Start Time 1400   Class Stop Time 1440   Class Time Calculation (min) 40 min    Row Name 06/15/22 1500     Education   Cardiac Education Topics Pritikin   Licensed conveyancer Nutrition   Nutrition Overview of the Pritikin Eating Plan   Instruction Review Code 1- Verbalizes Understanding   Class Start Time 1400   Class Stop Time 1450   Class Time Calculation (min) 50 min    Row Name 06/18/22 1600     Education   Cardiac Education Topics Pritikin   Select Workshops     Workshops   Educator Exercise Physiologist   Select Psychosocial   Psychosocial Workshop Recognizing and Reducing Stress   Instruction Review Code 1- Verbalizes Understanding   Class Start Time 1359   Class Stop Time 1445   Class Time Calculation (min) 46 min    Row Name 06/20/22 1500     Education   Cardiac Education Topics Pritikin   Orthoptist   Educator Dietitian   Weekly Topic Personalizing Your Pritikin Plate   Instruction Review Code 1- Verbalizes Understanding   Class Start Time 1355   Class Stop Time 1437    Class Time Calculation (min) 42 min    Row Name 06/22/22 1500  Education   Cardiac Education Topics Pritikin   Psychologist, forensic Psychosocial   Psychosocial Healthy Minds, Bodies, Hearts   Instruction Review Code 1- Verbalizes Understanding   Class Start Time 1357   Class Stop Time 1429   Class Time Calculation (min) 32 min    Row Name 06/27/22 1600     Education   Cardiac Education Topics Pritikin   Customer service manager   Weekly Topic [employer] Desserts   Instruction Review Code 1- Verbalizes Understanding   Class Start Time 1400   Class Stop Time 1450   Class Time Calculation (min) 50 min    Row Name 06/29/22 1500     Education   Cardiac Education Topics Pritikin   Licensed conveyancer Nutrition   Nutrition Other   Instruction Review Code 1- Verbalizes Understanding   Class Start Time 1400   Class Stop Time 1440   Class Time Calculation (min) 40 min    Row Name 07/02/22 1500     Education   Cardiac Education Topics Pritikin   Immunologist Exercise Physiologist   Select Exercise   Exercise Workshop Exercise Basics: Building Your Action Plan   Instruction Review Code 1- Verbalizes Understanding   Class Start Time 1404   Class Stop Time 1448   Class Time Calculation (min) 44 min    Row Name 07/04/22 1500     Education   Cardiac Education Topics Pritikin   Customer service manager   Weekly Topic Tasty Appetizers and Snacks   Instruction Review Code 1- Verbalizes Understanding   Class Start Time 1400   Class Stop Time 1440   Class Time Calculation (min) 40 min    Row Name 07/06/22 1400     Education   Cardiac Education Topics Pritikin   Nurse, children's Exercise Physiologist   Select Nutrition    Nutrition Nutrition Action Plan   Instruction Review Code 1- Verbalizes Understanding   Class Start Time 1400   Class Stop Time 1440   Class Time Calculation (min) 40 min            Core Videos: Exercise    Move It!  Clinical staff conducted group or individual video education with verbal and written material and guidebook.  Patient learns the recommended Pritikin exercise program. Exercise with the goal of living a long, healthy life. Some of the health benefits of exercise include controlled diabetes, healthier blood pressure levels, improved cholesterol levels, improved heart and lung capacity, improved sleep, and better body composition. Everyone should speak with their doctor before starting or changing an exercise routine.  Biomechanical Limitations Clinical staff conducted group or individual video education with verbal and written material and guidebook.  Patient learns how biomechanical limitations can impact exercise and how we can mitigate and possibly overcome limitations to have an impactful and balanced exercise routine.  Body Composition Clinical staff conducted group or individual video education with verbal and written material and guidebook.  Patient learns that body composition (ratio of muscle mass to fat mass) is a key component to assessing overall fitness, rather than body weight alone. Increased fat mass, especially  visceral belly fat, can put [place] at increased risk for metabolic syndrome, type 2 diabetes, heart disease, and even death. It is recommended to combine diet and exercise (cardiovascular and resistance training) to improve your body composition. Seek guidance from your physician and exercise physiologist before implementing an exercise routine.  Exercise Action Plan Clinical staff conducted group or individual video education with verbal and written material and guidebook.  Patient learns the recommended strategies to achieve and enjoy long-term exercise  adherence, including variety, self-motivation, self-efficacy, and positive decision making. Benefits of exercise include fitness, good health, weight management, more energy, better sleep, less stress, and overall well-being.  Medical   Heart Disease Risk Reduction Clinical staff conducted group or individual video education with verbal and written material and guidebook.  Patient learns our heart is our most vital organ as it circulates oxygen, nutrients, white blood cells, and hormones throughout the entire body, and carries waste away. Data supports a plant-based eating plan like the Pritikin Program for its effectiveness in slowing progression of and reversing heart disease. The video provides a number of recommendations to address heart disease.   Metabolic Syndrome and Belly Fat  Clinical staff conducted group or individual video education with verbal and written material and guidebook.  Patient learns what metabolic syndrome is, how it leads to heart disease, and how one can reverse it and keep it from coming back. You have metabolic syndrome if you have 3 of the following 5 criteria: abdominal obesity, high blood pressure, high triglycerides, low HDL cholesterol, and high blood sugar.  Hypertension and Heart Disease Clinical staff conducted group or individual video education with verbal and written material and guidebook.  Patient learns that high blood pressure, or hypertension, is very common in the [place]. Hypertension is largely due to excessive salt intake, but other important risk factors include being overweight, physical inactivity, drinking too much alcohol, smoking, and not eating enough potassium from fruits and vegetables. High blood pressure is a leading risk factor for heart attack, stroke, congestive heart failure, dementia, kidney failure, and premature death. Long-term effects of excessive salt intake include stiffening of the arteries and thickening of heart muscle and  organ damage. Recommendations include ways to reduce hypertension and the risk of heart disease.  Diseases of Our Time - Focusing on Diabetes Clinical staff conducted group or individual video education with verbal and written material and guidebook.  Patient learns why the best way to stop diseases of our time is prevention, through food and other lifestyle changes. Medicine (such as prescription pills and surgeries) is often only a Band-Aid on the problem, not a long-term solution. Most common diseases of our time include obesity, type 2 diabetes, hypertension, heart disease, and cancer. The Pritikin Program is recommended and has been proven to help reduce, reverse, and/or prevent the damaging effects of metabolic syndrome.  Nutrition   Overview of the Pritikin Eating Plan  Clinical staff conducted group or individual video education with verbal and written material and guidebook.  Patient learns about the Pritikin Eating Plan for disease risk reduction. The Pritikin Eating Plan emphasizes a wide variety of unrefined, minimally-processed carbohydrates, like fruits, vegetables, whole grains, and legumes. Go, Caution, and Stop food choices are explained. Plant-based and lean animal proteins are emphasized. Rationale provided for low sodium intake for blood pressure control, low added sugars for blood sugar stabilization, and low added fats and oils for coronary artery disease risk reduction and weight management.  Calorie Density  Clinical staff conducted  group or individual video education with verbal and written material and guidebook.  Patient learns about calorie density and how it impacts the Pritikin Eating Plan. Knowing the characteristics of the food you choose will help you decide whether those foods will lead to weight gain or weight loss, and whether you want to consume more or less of them. Weight loss is usually a side effect of the Pritikin Eating Plan because of its focus on low  calorie-dense foods.  Label Reading  Clinical staff conducted group or individual video education with verbal and written material and guidebook.  Patient learns about the Pritikin recommended label reading guidelines and corresponding recommendations regarding calorie density, added sugars, sodium content, and whole grains.  Dining Out - Part 1  Clinical staff conducted group or individual video education with verbal and written material and guidebook.  Patient learns that restaurant meals can be sabotaging because they can be so high in calories, fat, sodium, and/or sugar. Patient learns recommended strategies on how to positively address this and avoid unhealthy pitfalls.  Facts on Fats  Clinical staff conducted group or individual video education with verbal and written material and guidebook.  Patient learns that lifestyle modifications can be just as effective, if not more so, as many medications for lowering your risk of heart disease. A Pritikin lifestyle can help to reduce your risk of inflammation and atherosclerosis (cholesterol build-up, or plaque, in the artery walls). Lifestyle interventions such as dietary choices and physical activity address the cause of atherosclerosis. A review of the types of fats and their impact on blood cholesterol levels, along with dietary recommendations to reduce fat intake is also included.  Nutrition Action Plan  Clinical staff conducted group or individual video education with verbal and written material and guidebook.  Patient learns how to incorporate Pritikin recommendations into their lifestyle. Recommendations include planning and keeping personal health goals in mind as an important part of their success.  Healthy Mind-Set    Healthy Minds, Bodies, Hearts  Clinical staff conducted group or individual video education with verbal and written material and guidebook.  Patient learns how to identify when they are stressed. Video will discuss the  impact of that stress, as well as the many benefits of stress management. Patient will also be introduced to stress management techniques. The way we think, act, and feel has an impact on our hearts.  How Our Thoughts Can Heal Our Hearts  Clinical staff conducted group or individual video education with verbal and written material and guidebook.  Patient learns that negative thoughts can cause depression and anxiety. This can result in negative lifestyle behavior and serious health problems. Cognitive behavioral therapy is an effective method to help control our thoughts in order to change and improve our emotional outlook.  Additional Videos:  Exercise    Improving Performance  Clinical staff conducted group or individual video education with verbal and written material and guidebook.  Patient learns to use a non-linear approach by alternating intensity levels and lengths of time spent exercising to help burn more calories and lose more body fat. Cardiovascular exercise helps improve heart health, metabolism, hormonal balance, blood sugar control, and recovery from fatigue. Resistance training improves strength, endurance, balance, coordination, reaction time, metabolism, and muscle mass. Flexibility exercise improves circulation, posture, and balance. Seek guidance from your physician and exercise physiologist before implementing an exercise routine and learn your capabilities and proper form for all exercise.  Introduction to Yoga  Clinical staff conducted group or individual  video education with verbal and written material and guidebook.  Patient learns about yoga, a discipline of the coming together of mind, breath, and body. The benefits of yoga include improved flexibility, improved range of motion, better posture and core strength, increased lung function, weight loss, and positive self-image. Yoga's heart health benefits include lowered blood pressure, healthier heart rate, decreased  cholesterol and triglyceride levels, improved immune function, and reduced stress. Seek guidance from your physician and exercise physiologist before implementing an exercise routine and learn your capabilities and proper form for all exercise.  Medical   Aging: Enhancing Your Quality of Life  Clinical staff conducted group or individual video education with verbal and written material and guidebook.  Patient learns key strategies and recommendations to stay in good physical health and enhance quality of life, such as prevention strategies, having an advocate, securing a Health Care Proxy and Power of Attorney, and keeping a list of medications and system for tracking them. It also discusses how to avoid risk for bone loss.  Biology of Weight Control  Clinical staff conducted group or individual video education with verbal and written material and guidebook.  Patient learns that weight gain occurs because we consume more calories than we burn (eating more, moving less). Even if your body weight is normal, you may have higher ratios of fat compared to muscle mass. Too much body fat puts you at increased risk for cardiovascular disease, heart attack, stroke, type 2 diabetes, and obesity-related cancers. In addition to exercise, following the Pritikin Eating Plan can help reduce your risk.  Decoding Lab Results  Clinical staff conducted group or individual video education with verbal and written material and guidebook.  Patient learns that lab test reflects one measurement whose values change over time and are influenced by many factors, including medication, stress, sleep, exercise, food, hydration, pre-existing medical conditions, and more. It is recommended to use the knowledge from this video to become more involved with your lab results and evaluate your numbers to speak with your doctor.   Diseases of Our Time - Overview  Clinical staff conducted group or individual video education with verbal  and written material and guidebook.  Patient learns that according to the CDC, 50% to 70% of chronic diseases (such as obesity, type 2 diabetes, elevated lipids, hypertension, and heart disease) are avoidable through lifestyle improvements including healthier food choices, listening to satiety cues, and increased physical activity.  Sleep Disorders Clinical staff conducted group or individual video education with verbal and written material and guidebook.  Patient learns how good quality and duration of sleep are important to overall health and well-being. Patient also learns about sleep disorders and how they impact health along with recommendations to address them, including discussing with a physician.  Nutrition  Dining Out - Part 2 Clinical staff conducted group or individual video education with verbal and written material and guidebook.  Patient learns how to plan ahead and communicate in order to maximize their dining experience in a healthy and nutritious manner. Included are recommended food choices based on the type of restaurant the patient is visiting.   Fueling a Banker conducted group or individual video education with verbal and written material and guidebook.  There is a strong connection between our food choices and our health. Diseases like obesity and type 2 diabetes are very prevalent and are in large-part due to lifestyle choices. The Pritikin Eating Plan provides plenty of food and hunger-curbing satisfaction. It is easy  to follow, affordable, and helps reduce health risks.  Menu Workshop  Clinical staff conducted group or individual video education with verbal and written material and guidebook.  Patient learns that restaurant meals can sabotage health goals because they are often packed with calories, fat, sodium, and sugar. Recommendations include strategies to plan ahead and to communicate with the manager, chef, or server to help order a healthier  meal.  Planning Your Eating Strategy  Clinical staff conducted group or individual video education with verbal and written material and guidebook.  Patient learns about the Pritikin Eating Plan and its benefit of reducing the risk of disease. The Pritikin Eating Plan does not focus on calories. Instead, it emphasizes high-quality, nutrient-rich foods. By knowing the characteristics of the foods, we choose, we can determine their calorie density and make informed decisions.  Targeting Your Nutrition Priorities  Clinical staff conducted group or individual video education with verbal and written material and guidebook.  Patient learns that lifestyle habits have a tremendous impact on disease risk and progression. This video provides eating and physical activity recommendations based on your personal health goals, such as reducing LDL cholesterol, losing weight, preventing or controlling type 2 diabetes, and reducing high blood pressure.  Vitamins and Minerals  Clinical staff conducted group or individual video education with verbal and written material and guidebook.  Patient learns different ways to obtain key vitamins and minerals, including through a recommended healthy diet. It is important to discuss all supplements you take with your doctor.   Healthy Mind-Set    Smoking Cessation  Clinical staff conducted group or individual video education with verbal and written material and guidebook.  Patient learns that cigarette smoking and tobacco addiction pose a serious health risk which affects millions of people. Stopping smoking will significantly reduce the risk of heart disease, lung disease, and many forms of cancer. Recommended strategies for quitting are covered, including working with your doctor to develop a successful plan.  Culinary   Becoming a Set designer conducted group or individual video education with verbal and written material and guidebook.  Patient learns  that cooking at home can be healthy, cost-effective, quick, and puts them in control. Keys to cooking healthy recipes will include looking at your recipe, assessing your equipment needs, planning ahead, making it simple, choosing cost-effective seasonal ingredients, and limiting the use of added fats, salts, and sugars.  Cooking - Breakfast and Snacks  Clinical staff conducted group or individual video education with verbal and written material and guidebook.  Patient learns how important breakfast is to satiety and nutrition through the entire day. Recommendations include key foods to eat during breakfast to help stabilize blood sugar levels and to prevent overeating at meals later in the day. Planning ahead is also a key component.  Cooking - Educational psychologist conducted group or individual video education with verbal and written material and guidebook.  Patient learns eating strategies to improve overall health, including an approach to cook more at home. Recommendations include thinking of animal protein as a side on your plate rather than center stage and focusing instead on lower calorie dense options like vegetables, fruits, whole grains, and plant-based proteins, such as beans. Making sauces in large quantities to freeze for later and leaving the skin on your vegetables are also recommended to maximize your experience.  Cooking - Healthy Salads and Dressing Clinical staff conducted group or individual video education with verbal and written material and guidebook.  Patient learns that vegetables, fruits, whole grains, and legumes are the foundations of the Pritikin Eating Plan. Recommendations include how to incorporate each of these in flavorful and healthy salads, and how to create homemade salad dressings. Proper handling of ingredients is also covered. Cooking - Soups and [employer] - Soups and Desserts Clinical staff conducted group or individual video education with  verbal and written material and guidebook.  Patient learns that Pritikin soups and desserts make for easy, nutritious, and delicious snacks and meal components that are low in sodium, fat, sugar, and calorie density, while high in vitamins, minerals, and filling fiber. Recommendations include simple and healthy ideas for soups and desserts.   Overview     The Pritikin Solution Program Overview Clinical staff conducted group or individual video education with verbal and written material and guidebook.  Patient learns that the results of the Pritikin Program have been documented in more than 100 articles published in peer-reviewed journals, and the benefits include reducing risk factors for (and, in some cases, even reversing) high cholesterol, high blood pressure, type 2 diabetes, obesity, and more! An overview of the three key pillars of the Pritikin Program will be covered: eating well, doing regular exercise, and having a healthy mind-set.  WORKSHOPS  Exercise: Exercise Basics: Building Your Action Plan Clinical staff led group instruction and group discussion with PowerPoint presentation and patient guidebook. To enhance the learning environment the use of posters, models and videos may be added. At the conclusion of this workshop, patients will comprehend the difference between physical activity and exercise, as well as the benefits of incorporating both, into their routine. Patients will understand the FITT (Frequency, Intensity, Time, and Type) principle and how to use it to build an exercise action plan. In addition, safety concerns and other considerations for exercise and cardiac rehab will be addressed by the presenter. The purpose of this lesson is to promote a comprehensive and effective weekly exercise routine in order to improve patients' overall level of fitness.   Managing Heart Disease: Your Path to a Healthier Heart Clinical staff led group instruction and group discussion with  PowerPoint presentation and patient guidebook. To enhance the learning environment the use of posters, models and videos may be added.At the conclusion of this workshop, patients will understand the anatomy and physiology of the heart. Additionally, they will understand how Pritikin's three pillars impact the risk factors, the progression, and the management of heart disease.  The purpose of this lesson is to provide a high-level overview of the heart, heart disease, and how the Pritikin lifestyle positively impacts risk factors.  Exercise Biomechanics Clinical staff led group instruction and group discussion with PowerPoint presentation and patient guidebook. To enhance the learning environment the use of posters, models and videos may be added. Patients will learn how the structural parts of their bodies function and how these functions impact their daily activities, movement, and exercise. Patients will learn how to promote a neutral spine, learn how to manage pain, and identify ways to improve their physical movement in order to promote healthy living. The purpose of this lesson is to expose patients to common physical limitations that impact physical activity. Participants will learn practical ways to adapt and manage aches and pains, and to minimize their effect on regular exercise. Patients will learn how to maintain good posture while sitting, walking, and lifting.  Balance Training and Fall Prevention  Clinical staff led group instruction and group discussion with PowerPoint presentation and patient guidebook.  To enhance the learning environment the use of posters, models and videos may be added. At the conclusion of this workshop, patients will understand the importance of their sensorimotor skills (vision, proprioception, and the vestibular system) in maintaining their ability to balance as they age. Patients will apply a variety of balancing exercises that are appropriate for their  current level of function. Patients will understand the common causes for poor balance, possible solutions to these problems, and ways to modify their physical environment in order to minimize their fall risk. The purpose of this lesson is to teach patients about the importance of maintaining balance as they age and ways to minimize their risk of falling.  WORKSHOPS   Nutrition:  Fueling a Ship broker led group instruction and group discussion with PowerPoint presentation and patient guidebook. To enhance the learning environment the use of posters, models and videos may be added. Patients will review the foundational principles of the Pritikin Eating Plan and understand what constitutes a serving size in each of the food groups. Patients will also learn Pritikin-friendly foods that are better choices when away from home and review make-ahead meal and snack options. Calorie density will be reviewed and applied to three nutrition priorities: weight maintenance, weight loss, and weight gain. The purpose of this lesson is to reinforce (in a group setting) the key concepts around what patients are recommended to eat and how to apply these guidelines when away from home by planning and selecting Pritikin-friendly options. Patients will understand how calorie density may be adjusted for different weight management goals.  Mindful Eating  Clinical staff led group instruction and group discussion with PowerPoint presentation and patient guidebook. To enhance the learning environment the use of posters, models and videos may be added. Patients will briefly review the concepts of the Pritikin Eating Plan and the importance of low-calorie dense foods. The concept of mindful eating will be introduced as well as the importance of paying attention to internal hunger signals. Triggers for non-hunger eating and techniques for dealing with triggers will be explored. The purpose of this lesson is to provide  patients with the opportunity to review the basic principles of the Pritikin Eating Plan, discuss the value of eating mindfully and how to measure internal cues of hunger and fullness using the Hunger Scale. Patients will also discuss reasons for non-hunger eating and learn strategies to use for controlling emotional eating.  Targeting Your Nutrition Priorities Clinical staff led group instruction and group discussion with PowerPoint presentation and patient guidebook. To enhance the learning environment the use of posters, models and videos may be added. Patients will learn how to determine their genetic susceptibility to disease by reviewing their family history. Patients will gain insight into the importance of diet as part of an overall healthy lifestyle in mitigating the impact of genetics and other environmental insults. The purpose of this lesson is to provide patients with the opportunity to assess their personal nutrition priorities by looking at their family history, their own health history and current risk factors. Patients will also be able to discuss ways of prioritizing and modifying the Pritikin Eating Plan for their highest risk areas  Menu  Clinical staff led group instruction and group discussion with PowerPoint presentation and patient guidebook. To enhance the learning environment the use of posters, models and videos may be added. Using menus brought in from [employer], or printed from [employer], patients will apply the Pritikin dining out guidelines that were presented in  the [employer] video. Patients will also be able to practice these guidelines in a variety of provided scenarios. The purpose of this lesson is to provide patients with the opportunity to practice hands-on learning of the Pritikin Dining Out guidelines with actual menus and practice scenarios.  Label Reading Clinical staff led group instruction and group discussion with PowerPoint  presentation and patient guidebook. To enhance the learning environment the use of posters, models and videos may be added. Patients will review and discuss the Pritikin label reading guidelines presented in Pritikin's Label Reading Educational series video. Using fool labels brought in from local grocery stores and markets, patients will apply the label reading guidelines and determine if the packaged food meet the Pritikin guidelines. The purpose of this lesson is to provide patients with the opportunity to review, discuss, and practice hands-on learning of the Pritikin Label Reading guidelines with actual packaged food labels. Cooking School  Pritikin's [employer] are designed to teach patients ways to prepare quick, simple, and affordable recipes at home. The importance of nutrition's role in chronic disease risk reduction is reflected in its emphasis in the overall Pritikin program. By learning how to prepare essential core Pritikin Eating Plan recipes, patients will increase control over what they eat; be able to customize the flavor of foods without the use of added salt, sugar, or fat; and improve the quality of the food they consume. By learning a set of core recipes which are easily assembled, quickly prepared, and affordable, patients are more likely to prepare more healthy foods at home. These workshops focus on convenient breakfasts, simple entres, side dishes, and desserts which can be prepared with minimal effort and are consistent with nutrition recommendations for cardiovascular risk reduction. Cooking [employer] are taught by a Armed forces logistics/support/administrative officer (RD) who has been trained by the [employer]. The chef or RD has a clear understanding of the importance of minimizing - if not completely eliminating - added fat, sugar, and sodium in recipes. Throughout the series of Cooking School Workshop sessions, patients will learn about healthy ingredients and  efficient methods of cooking to build confidence in their capability to prepare    Cooking School weekly topics:  Adding Flavor- Sodium-Free  Fast and Healthy Breakfasts  Powerhouse Plant-Based Proteins  Satisfying Salads and Dressings  Simple Sides and Sauces  International Cuisine-Spotlight on the [employer] Zones  Delicious Desserts  Savory Soups  [employer] - Meals in a Astronomer Appetizers and Snacks  Comforting Weekend Breakfasts  One-Pot Wonders   Fast Evening Meals  Landscape architect Your Pritikin Plate  WORKSHOPS   Healthy Mindset (Psychosocial):  Focused Goals, Sustainable Changes Clinical staff led group instruction and group discussion with PowerPoint presentation and patient guidebook. To enhance the learning environment the use of posters, models and videos may be added. Patients will be able to apply effective goal setting strategies to establish at least one personal goal, and then take consistent, meaningful action toward that goal. They will learn to identify common barriers to achieving personal goals and develop strategies to overcome them. Patients will also gain an understanding of how our mind-set can impact our ability to achieve goals and the importance of cultivating a positive and growth-oriented mind-set. The purpose of this lesson is to provide patients with a deeper understanding of how to set and achieve personal goals, as well as the tools and strategies needed to overcome common obstacles which may arise along  the way.  From Head to Heart: The Power of a Healthy Outlook  Clinical staff led group instruction and group discussion with PowerPoint presentation and patient guidebook. To enhance the learning environment the use of posters, models and videos may be added. Patients will be able to recognize and describe the impact of emotions and mood on physical health. They will discover the importance of self-care and explore self-care  practices which may work for them. Patients will also learn how to utilize the 4 C's to cultivate a healthier outlook and better manage stress and challenges. The purpose of this lesson is to demonstrate to patients how a healthy outlook is an essential part of maintaining good health, especially as they continue their cardiac rehab journey.  Healthy Sleep for a Healthy Heart Clinical staff led group instruction and group discussion with PowerPoint presentation and patient guidebook. To enhance the learning environment the use of posters, models and videos may be added. At the conclusion of this workshop, patients will be able to demonstrate knowledge of the importance of sleep to overall health, well-being, and quality of life. They will understand the symptoms of, and treatments for, common sleep disorders. Patients will also be able to identify daytime and nighttime behaviors which impact sleep, and they will be able to apply these tools to help manage sleep-related challenges. The purpose of this lesson is to provide patients with a general overview of sleep and outline the importance of quality sleep. Patients will learn about a few of the most common sleep disorders. Patients will also be introduced to the concept of "sleep hygiene," and discover ways to self-manage certain sleeping problems through simple daily behavior changes. Finally, the workshop will motivate patients by clarifying the links between quality sleep and their goals of heart-healthy living.   Recognizing and Reducing Stress Clinical staff led group instruction and group discussion with PowerPoint presentation and patient guidebook. To enhance the learning environment the use of posters, models and videos may be added. At the conclusion of this workshop, patients will be able to understand the types of stress reactions, differentiate between acute and chronic stress, and recognize the impact that chronic stress has on their health. They  will also be able to apply different coping mechanisms, such as reframing negative self-talk. Patients will have the opportunity to practice a variety of stress management techniques, such as deep abdominal breathing, progressive muscle relaxation, and/or guided imagery.  The purpose of this lesson is to educate patients on the role of stress in their lives and to provide healthy techniques for coping with it.  Learning Barriers/Preferences:  Learning Barriers/Preferences - 05/17/22 1601       Learning Barriers/Preferences   Learning Barriers Sight;Exercise Concerns;Hearing   Pt is visually impaired, many pain issues, back surgery, assistive device and artritis. [facility]   Learning Preferences Audio;Pictoral;Skilled Demonstration;Verbal Instruction;Video;Written Material;Individual Instruction;Group Instruction;Computer/Internet             Education Topics:  Knowledge Questionnaire Score:  Knowledge Questionnaire Score - 06/06/22 1426       Knowledge Questionnaire Score   Pre Score 19/24             Core Components/Risk Factors/Patient Goals at Admission:  Personal Goals and Risk Factors at Admission - 05/17/22 1603       Core Components/Risk Factors/Patient Goals on Admission   Hypertension Yes    Intervention Provide education on lifestyle modifcations including regular physical activity/exercise, weight management, moderate sodium restriction and increased consumption of fresh fruit,  vegetables, and low fat dairy, alcohol moderation, and smoking cessation.;Monitor prescription use compliance.    Expected Outcomes Short Term: Continued assessment and intervention until BP is < 140/79mm HG in hypertensive participants. < 130/14mm HG in hypertensive participants with diabetes, heart failure or chronic kidney disease.;Long Term: Maintenance of blood pressure at goal levels.    Lipids Yes    Intervention Provide education and support for participant on nutrition & aerobic/resistive  exercise along with prescribed medications to achieve LDL 70mg , HDL >40mg .    Expected Outcomes Short Term: Participant states understanding of desired cholesterol values and is compliant with medications prescribed. Participant is following exercise prescription and nutrition guidelines.;Long Term: Cholesterol controlled with medications as prescribed, with individualized exercise RX and with personalized nutrition plan. Value goals: LDL < 70mg , HDL > 40 mg.    Stress Yes    Intervention Offer individual and/or small group education and counseling on adjustment to heart disease, stress management and health-related lifestyle change. Teach and support self-help strategies.;Refer participants experiencing significant psychosocial distress to appropriate mental health specialists for further evaluation and treatment. When possible, include family members and significant others in education/counseling sessions.    Expected Outcomes Short Term: Participant demonstrates changes in health-related behavior, relaxation and other stress management skills, ability to obtain effective social support, and compliance with psychotropic medications if prescribed.;Long Term: Emotional wellbeing is indicated by absence of clinically significant psychosocial distress or social isolation.    Personal Goal Other Yes    Personal Goal Short term: increase QOL Long term: stamina    Intervention Will continue to monitor pt and progress workloads as tolerated without sign or symptom    Expected Outcomes Pt will achieve his goals             Core Components/Risk Factors/Patient Goals Review:   Goals and Risk Factor Review     Row Name 06/11/22 1721 07/05/22 1346           Core Components/Risk Factors/Patient Goals Review   Personal Goals Review Weight Management/Obesity;Hypertension;Lipids;Stress Weight Management/Obesity;Hypertension;Lipids;Stress      Review Domnique started intensive cardiac rehab and did well with  exercise for his fitness level. Vital signs were stable. Kailum is decondtioned and uses a cane Yi is doing well with exercise at  intensive cardiac rehab.  Vital signs have been  stable. Carrell has lost 4.6 kg since starting cardiac rehab      Expected Outcomes Jayven will continue to participate in intensive cardiac rehab for exercise, nutrtiion and lifestyle modifications Shaunta will continue to participate in intensive cardiac rehab for exercise, nutrtiion and lifestyle modifications               Core Components/Risk Factors/Patient Goals at Discharge (Final Review):   Goals and Risk Factor Review - 07/05/22 1346       Core Components/Risk Factors/Patient Goals Review   Personal Goals Review Weight Management/Obesity;Hypertension;Lipids;Stress    Review Jovane is doing well with exercise at  intensive cardiac rehab.  Vital signs have been  stable. Marvin has lost 4.6 kg since starting cardiac rehab    Expected Outcomes Ayoub will continue to participate in intensive cardiac rehab for exercise, nutrtiion and lifestyle modifications             ITP Comments:  ITP Comments     Row Name 05/17/22 1402 06/11/22 1716 07/05/22 1341       ITP Comments Introduction to Pritikin Education Program/ Intensive Cardiac Rehab. Initial Orientation Packet Reviewed with the patient and  his wife 30 Day ITP Review. Quasir started intensive cardiac rehab on 06/11/22 and did fair with exercise for his fitness level 30 Day ITP Review. Clell has good attendance and participation in intensive cardiac rehab              Comments: See ITP comments.Thayer Headings RN BSN

## 2022-07-06 ENCOUNTER — Encounter (HOSPITAL_COMMUNITY)
Admission: RE | Admit: 2022-07-06 | Discharge: 2022-07-06 | Disposition: A | Discharge status: Home / Self Care | Payer: Medicare Other | Source: Ambulatory Visit | Attending: Cardiovascular Disease | Admitting: Cardiovascular Disease

## 2022-07-06 DIAGNOSIS — Z952 Presence of prosthetic heart valve: Secondary | ICD-10-CM | POA: Diagnosis not present

## 2022-07-09 ENCOUNTER — Ambulatory Visit: Payer: Medicare Other | Attending: Cardiovascular Disease | Admitting: Cardiovascular Disease

## 2022-07-09 ENCOUNTER — Ambulatory Visit: Payer: Medicare Other | Admitting: Internal Medicine

## 2022-07-09 ENCOUNTER — Encounter: Payer: Self-pay | Admitting: Cardiovascular Disease

## 2022-07-09 VITALS — BP 120/60 | HR 81 | Ht 70.0 in | Wt 150.0 lb

## 2022-07-09 DIAGNOSIS — N1832 Chronic kidney disease, stage 3b: Secondary | ICD-10-CM | POA: Insufficient documentation

## 2022-07-09 DIAGNOSIS — I10 Essential (primary) hypertension: Secondary | ICD-10-CM | POA: Diagnosis not present

## 2022-07-09 DIAGNOSIS — Z952 Presence of prosthetic heart valve: Secondary | ICD-10-CM | POA: Insufficient documentation

## 2022-07-09 DIAGNOSIS — I480 Paroxysmal atrial fibrillation: Secondary | ICD-10-CM | POA: Insufficient documentation

## 2022-07-09 NOTE — Patient Instructions (Signed)
Medication Instructions:  Your physician recommends that you continue on your current medications as directed. Please refer to the Current Medication list given to you today.  *If you need a refill on your cardiac medications before your next appointment, please call your pharmacy*  Follow-Up: At [facility], you and your health needs are our priority.  As part of our continuing mission to provide you with exceptional heart care, we have created designated Provider Care Teams.  These Care Teams include your primary Cardiologist (physician) and Advanced Practice Providers (APPs -  Physician Assistants and Nurse Practitioners) who all work together to provide you with the care you need, when you need it.  We recommend signing up for the patient portal called "MyChart".  Sign up information is provided on this After Visit Summary.  MyChart is used to connect with patients for Virtual Visits (Telemedicine).  Patients are able to view lab/test results, encounter notes, upcoming appointments, etc.  Non-urgent messages can be sent to your provider as well.   To learn more about what you can do with MyChart, go to [email or web address].    Your next appointment:   6 month(s)  Provider:   Tonny Bollman, MD

## 2022-07-09 NOTE — Progress Notes (Signed)
Cardiology Office Note:    Date:  07/09/2022   ID:  DUSHON KANU, DOB Jun 05, 1926, MRN [id number]  PCP:  Pincus Sanes, MD   [facility] Providers Cardiologist:  Tonny Bollman, MD Electrophysiologist:  Maurice Small, MD     Referring MD: Pincus Sanes, MD   Chief Complaint  Patient presents with   Coronary Artery Disease    History of Present Illness:    MILIANO SARDO is a 89 y.o. male presenting for follow-up evaluation, here with his wife today. The patient has a history of coronary artery disease with remote stenting in 2001 followed by multivessel CABG in 2013 with early failure of his saphenous vein graft conduits. He has had continued patency of the LIMA to LAD graft. 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 He underwent TAVR in January 2024 for treatment of severe aortic stenosis.   The patient is here with his wife today.  He is doing pretty well.  He complains of fatigue.  No chest pain, heart palpitations, or shortness of breath.  No PND or orthopnea.  He does have lower extremity edema.  He had a recent PET scan showing a probable recurrence of prostate cancer near the bladder without distant metastases.  He had not seen the results of the scans we discussed this today.  He will follow-up with urology on this.  Past Medical History:  Diagnosis Date   Anemia    AV block, 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 S/P stenting to mid RCA, prox PDA 06/1999. b. NSTEMI/CABG x 3 in 10/2011 with LIMA to LAD, SVG to PDA, and SVG to OM1.    Cephalalgia 07/14/2015   CHB (complete heart block) 05/03/2012   Overview:  Status post complete heart block heart rate 28 bpm, alternating with 2 to one AV block and  drug for bradycardia.  Status post pacemaker implantation.status post Medtronic pacemaker implantation the 03/28/2012   Chronic diastolic CHF (congestive heart failure) 02/05/2018   Chronic gouty arthritis    Chronic UTI    a. Followed by Dr. [name] Caprice - colonized/asymptomatic - not on abx   CKD (chronic kidney disease)    stage 3, GFR 30-59 ml/min; stable with a creatinine around 1.9-2.0 followed by nephrology.   Constipation 03/01/2015   Symptoms and exam consistent with constipation. Abdominal exam is benign with no evidence of pain or obstruction. Discussed importance of increasing fiber and water intake coupled with physical activity to assist with bowel movements. Continue over-the-counter medication management as needed. Follow-up if symptoms worsen or fail to improve.   Coronary atherosclerosis 11/27/2011   Overview:  Multivessel coronary artery disease recently diagnosed by catheterization 2013 History of stent placement with a heparin-coated stent 2001  Last Assessment & Plan:  Status post coronary bypass grafting.  No recurrent chest pain. Overview:  Overview:  S/P stenting to mid RCA, prox PDA 06/1999;  06/2007 Myoview: negative except for apical thinning, EF 68%, last Myoview August 10, 2008 with n   Degeneration of lumbar intervertebral disc    Degenerative disc disease, cervical, with radiculopathy 09/21/2008   Diastolic dysfunction 07/04/2017   Grade 1 DD on Echo 06/2017   Dyslipidemia    Dysphagia 02/06/2016   3/18 - DG Esophagus:  1. Mild esophageal dysmotility,  likely presbyesophagus. 2. No other explanation for patient's symptoms. 3. Small hiatal hernia.  On swallow eval - evidence of cervical spine disease and that was likely contributing   Epistaxis 12/08/2020   Essential hypertension 07/20/2006   GERD (gastroesophageal reflux disease)    Gout 05/12/2018   Gouty arthritis of right great toe 09/03/2017   Left toe Injected January 31, 2018    Hyperpigmentation 11/04/2017    Internal hemorrhoids 08/15/2016   Left inguinal hernia 07/01/2019   Lumbar post-laminectomy syndrome 12/12/2017   Moderate aortic regurgitation 07/04/2017   Echo 06/2017:  EF 55-60%, mild LVH, grade 1 DD, mild AS, mod AR, mild MR, mild-mod TR   Neck injury    a. 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  This was switched to by mouth amiodarone at the time of the thoracic surgery appointment was discontinued.   Presence of aortocoronary bypass graft 10/24/2011   Overview:  Performed at [facility] 2013 Last Assessment & Plan:  No complication post coronary bypass grafting.   Prostate cancer (HCC)    a. 2001 s/p TURP.   Pulmonary nodule    a. felt to be noncancerous.  Status post followup CT scan 4 mm and stable.   Renal artery stenosis    a. 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 Excell Seltzer & Dr. 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   Trochanteric bursitis of hip, bilateral 01/10/2017   UGIB (upper gastrointestinal bleed) 02/01/2018   EGD  02/06/18 - mod, non-erosive gastritis   Venous insufficiency of leg 06/05/2010   Vitamin B12 deficiency 08/23/2009   Jan '14  July '14 B12 level  >1500    472   Weakness of both lower extremities 04/09/2020    Past Surgical History:  Procedure Laterality Date   ANTERIOR  CHAMBER WASHOUT Left 10/04/2018   Procedure: Anterior Chamber Washout, Vitreous Tap;  Surgeon: Carmela Rima, MD;  Location: [facility] OR;  Service: Ophthalmology;  Laterality: Left;   cardia catherization  07/07/1999   CARDIAC CATHETERIZATION     CARDIAC SURGERY  10/18/2012   open heart surgery   CATARACT EXTRACTION W/ INTRAOCULAR LENS  IMPLANT, BILATERAL  3/205, 06/2013   [facility]uen   COLONOSCOPY  04/12/2007   CORONARY ARTERY BYPASS GRAFT  10/19/2011   Procedure: CORONARY ARTERY BYPASS GRAFTING (CABG);  Surgeon: Alleen Borne, MD;  Location: [facility] OR;  Service: Open Heart Surgery;  Laterality: N/A;  times three using Left Internal Mammary Artery and Right Greater Saphenouse Vein Graft harvested Endoscopically   edg  07/17/1994   FLEXIBLE SIGMOIDOSCOPY  11/03/1997   GAS INSERTION Left 10/04/2018   Procedure: Insertion Of Gas;  Surgeon: Carmela Rima, MD;  Location: [facility] OR;  Service: Ophthalmology;  Laterality: Left;   GAS/FLUID EXCHANGE Left 10/04/2018   Procedure: Gas/Fluid Exchange;  Surgeon: Carmela Rima, MD;  Location:  [facility] OR;  Service: Ophthalmology;  Laterality: Left;   INTRAOPERATIVE TRANSTHORACIC ECHOCARDIOGRAM N/A 02/06/2022   Procedure: INTRAOPERATIVE TRANSTHORACIC ECHOCARDIOGRAM;  Surgeon: Tonny Bollman, MD;  Location: [facility] INVASIVE CV LAB;  Service: Open Heart Surgery;  Laterality: N/A;   LEFT HEART CATHETERIZATION WITH CORONARY ANGIOGRAM N/A 10/16/2011   Procedure: LEFT HEART CATHETERIZATION WITH CORONARY ANGIOGRAM;  Surgeon: Peter M Swaziland, MD;  Location: [facility] CATH LAB;  Service: Cardiovascular;  Laterality: N/A;   LEFT HEART CATHETERIZATION WITH CORONARY/GRAFT ANGIOGRAM N/A 03/11/2013   Procedure: LEFT HEART CATHETERIZATION WITH [name] Caprice;  Surgeon: Micheline Chapman, MD;  Location: [facility] CATH LAB;  Service: Cardiovascular;  Laterality: N/A;   lumbar spinal disk and neck fusion surgery     PACEMAKER INSERTION  03/28/2012   PPM implanted for mobitz II AV block   PARS PLANA  VITRECTOMY Left 10/04/2018   Procedure: PARS PLANA VITRECTOMY 25 GAUGE FOR ENDOPHTHALMITIS WITH INJECTION OF INTRAVITREAL ANTIBIOTIC;  Surgeon: Carmela Rima, MD;  Location: [facility] OR;  Service: Ophthalmology;  Laterality: Left;   peripheral vascular catherization  11/24/2003   PERMANENT PACEMAKER INSERTION N/A 03/28/2012   Procedure: PERMANENT PACEMAKER INSERTION;  Surgeon: Hillis Range, MD;  Location: [facility] CATH LAB;  Service: Cardiovascular;  Laterality: N/A;   PROSTATECTOMY     renal circulation  10/01/2003   RIGHT HEART CATH N/A 03/02/2022   Procedure: RIGHT HEART CATH;  Surgeon: Runell Gess, MD;  Location: [facility] INVASIVE CV LAB;  Service: Cardiovascular;  Laterality: N/A;   s/p ptca     stents     X 2   stress cardiolite  05/04/2005   spring 09-negative except for apical thinning, EF 68%   TRANSCATHETER AORTIC VALVE REPLACEMENT, TRANSFEMORAL Right 02/06/2022   Procedure: Transcatheter Aortic Valve Replacement, Transfemoral;  Surgeon: Tonny Bollman, MD;  Location: [facility] INVASIVE CV LAB;  Service: Open Heart Surgery;  Laterality: Right;    Current Medications: Current Meds  Medication Sig   acetaminophen (TYLENOL) 500 MG tablet Take 1,000 mg by mouth 3 (three) times daily as needed for moderate pain.   allopurinol (ZYLOPRIM) 100 MG tablet TAKE 1 TABLET(100 MG) BY MOUTH DAILY (Patient taking differently: Take 100 mg by mouth daily. TAKE 1 TABLET(100 MG) BY MOUTH DAILY)   amLODipine (NORVASC) 5 MG tablet Take 1 tablet (5 mg total) by mouth daily.   amoxicillin (AMOXIL) 500 MG capsule Take 2,000 mg by mouth as directed. 1 HOUR PRIOR TO DENTAL CLEANINGS AND PROCEDURES   Aromatic Inhalants (VICKS VAPOR INHALER IN) Place 1 puff into both nostrils as needed (for congestion).   Ascorbic Acid (VITAMIN C) 1000 MG tablet Take 1,000 mg by mouth daily.   aspirin EC 81 MG tablet Take 81 mg by mouth daily.   Calcium Citrate-Vitamin D (CITRACAL + D PO) Take 2 tablets by mouth in the morning and at  bedtime.   Carboxymeth-Glycerin-Polysorb (REFRESH OPTIVE MEGA-3 OP) Place 1 drop into both eyes 2 (two) times daily.   carvedilol (COREG) 3.125 MG tablet Take 1 tablet (3.125 mg total) by mouth 2 (two) times daily.   Cholecalciferol 25 MCG (1000 UT) capsule Take 1,000 Units by mouth daily.   cyanocobalamin (,VITAMIN B-12,) 1000 MCG/ML injection Inject 1,000 mcg into the muscle once. Monthly injection   folic acid (FOLVITE) 1 MG tablet Take 1 tablet (1 mg total) by mouth daily. Annual appt due in May must see provider for future refills (Patient taking differently: Take 1 mg by mouth at bedtime. Annual appt due in May must see provider  for future refills)   furosemide (LASIX) 40 MG tablet Take 1 tablet by mouth every Monday, Wednesday, and Friday.  Take an extra pill three times per week prn   hydrALAZINE (APRESOLINE) 100 MG tablet Take 1 tablet (100 mg total) by mouth 3 (three) times daily.   loratadine (CLARITIN) 10 MG tablet Take 10 mg by mouth daily as needed (for seasonal allergies).   nitroGLYCERIN (NITROSTAT) 0.4 MG SL tablet DISSOLVE 1 TABLET UNDER THE TONGUE EVERY 5 MINUTES AS NEEDED FOR CHEST PAIN, MAXIMUM 3 TABLETS   Saline (AYR NASAL MIST ALLERGY/SINUS NA) Place 2 sprays into the nose as needed (dryness).   trolamine salicylate (ASPERCREME) 10 % cream Apply 1 Application topically in the morning and at bedtime. For leg cramps   Vitamin D, Ergocalciferol, (DRISDOL) 1.25 MG (50000 UNIT) CAPS capsule Take 50,000 Units by mouth every 30 (thirty) days. Taking on the 15th of each month.   [DISCONTINUED] azithromycin (ZITHROMAX) 250 MG tablet Take two tabs the first day and then one tab daily for four days   [DISCONTINUED] HYDROcodone bit-homatropine (HYCODAN) 5-1.5 MG/5ML syrup Take 5 mLs by mouth every 8 (eight) hours as needed for cough.   Current Facility-Administered Medications for the 07/09/22 encounter (Office Visit) with Tonny Bollman, MD  Medication   NON FORMULARY 1 application      Allergies:   Aspirin, Lisinopril, Amoxicillin, Atarax [hydroxyzine hcl], Cephalexin, Ciprofloxacin, Clindamycin, Clobetasol, Codeine, Fish allergy, Fluarix [influenza virus vaccine], Haemophilus influenzae, Hydrocodone, Hydrocodone-acetaminophen, Hydroxyzine, Latex, Macrobid [nitrofurantoin macrocrystal], Niacin, Niacin-lovastatin er, Niacin-lovastatin er, Nitrofurantoin, Omeprazole, Other, Tramadol, Vibramycin [doxycycline], Adhesive [tape], Bactrim [sulfamethoxazole-trimethoprim], Colchicine, Gabapentin, and Nortriptyline   Social History   Socioeconomic History   Marital status: Married    Spouse name: [name]   Number of children: 2   Years of education: 13   Highest education level: Some college, no degree  Occupational History   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  married '52 - 3 years, divorced; married '56 - 3 years divorced; married '63-12 yrs - divorced; married '75 -. 1 son '57; 1 daughter - '53; 1 grandchild.  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  Very happily married.  End of life care: yes CPR, no long term mechanical ventilation, no heroic measures.right handed   Right handed   Social Determinants of Health   Financial Resource Strain: Low Risk  (07/28/2021)   Overall Financial Resource Strain (CARDIA)    Difficulty of Paying Living Expenses: Not hard at all  Food Insecurity: No Food Insecurity (02/28/2022)   Hunger Vital Sign    Worried About Running Out of Food in  the Last Year: Never true    Ran Out of Food in the Last Year: Never true  Transportation Needs: No Transportation Needs (03/06/2022)   PRAPARE - Administrator, Civil Service (Medical): No    Lack of Transportation (Non-Medical): No  Physical Activity:  Insufficiently Active (07/28/2021)   Exercise Vital Sign    Days of Exercise per Week: 2 days    Minutes of Exercise per Session: 20 min  Stress: No Stress Concern Present (07/28/2021)   [employer] of Occupational Health - Occupational Stress Questionnaire    Feeling of Stress : Not at all  Social Connections: Socially Integrated (07/28/2021)   Social Connection and Isolation Panel [NHANES]    Frequency of Communication with Friends and Family: Three times a week    Frequency of Social Gatherings with Friends and Family: Three times a week    Attends Religious Services: More than 4 times per year    Active Member of Clubs or Organizations: Yes    Attends Banker Meetings: 1 to 4 times per year    Marital Status: Married     Family History: The patient's family history includes Arthritis in his mother; Breast cancer in an other family member; Cancer in his brother; Coronary artery disease in his father; Heart disease in his brother; Nephritis in his brother; Other in his brother and mother; Prostate cancer in his brother. There is no history of Diabetes, Colon cancer, or Adrenal disorder.  ROS:   Please see the history of present illness.    All other systems reviewed and are negative.  EKGs/Labs/Other Studies Reviewed:    The following studies were reviewed today: Cardiac Studies & Procedures   CARDIAC CATHETERIZATION  CARDIAC CATHETERIZATION 03/02/2022  Narrative Images from the original result were not included. RYSZARD WARTA is a 89 y.o. male   [id number] LOCATION:  FACILITY: [facility] PHYSICIAN: Nanetta Batty, M.D. 06-14-1926   DATE OF PROCEDURE:  03/02/2022  DATE OF  DISCHARGE:     Right heart catheterization    History obtained from chart review.  89 year old married Caucasian male referred for right heart cath by Dr. Eden Emms to determine filling pressures.  He does have a history of severe asymmetric septal hypertrophy, remote pacemaker and TAVR in January of this year.  Echo reveals normal LV function with well-functioning aortic bioprosthesis.  The patient has elevated liver function tests and chronic renal insufficiency.  He was referred for right heart cath to determine his hemodynamics.   HEMODYNAMICS:  1: Right atrial pressure-11/10 2: Right ventricular pressure-21/0 3: Pulmonary artery pressure-44/13, mean 25 4: Pulmonary artery wedge pressure-A-wave 15, V wave 13, mean 10 Cardiac output-8.1 L/min with an index of 4.3 L/min/m.   IMPRESSION:Mr Salts right heart cath suggested normal filling pressures.  He has mildly elevated pulmonary artery pressures.  Nanetta Batty. MD, [facility] 03/02/2022 10:14 AM   STRESS TESTS  MYOCARDIAL PERFUSION IMAGING 10/24/2017  Narrative  The left ventricular ejection fraction is normal (55-65%).  Nuclear stress EF: 56%.  Blood pressure demonstrated a hypertensive response to exercise.  There was no ST segment deviation noted during stress.  T wave inversion of 2 mm was noted during stress in the V1 and V2 leads, ending at 4 minutes of stress, and returning to baseline after 1-5 mins of recovery.  Defect 1: There is a large defect of moderate severity.  This is a low risk study.  No significant reversible ischemia. There is a large size, moderate intensity mostly fixed inferior defect, that mildly improves during upright positioning, suggestive of likely scar with a degree of bowel attenuation. There is also a moderate sized and intensity inferolateral perfusion defect which does change with supine or upright positioning, suggestive of scar. LVEF 56% with mild basal inferior hypokinesis. This is  an intermediate risk study given the extent of fixed perfusion defects.   ECHOCARDIOGRAM  ECHOCARDIOGRAM COMPLETE 03/14/2022  Narrative ECHOCARDIOGRAM REPORT    Patient Name:   CONNERY NICKELL Date of Exam: 03/14/2022 Medical Rec #:  [id number]          Height:       70.0 in Accession #:    [id number]         Weight:       157.0 lb Date of Birth:  [birth year]          BSA:          1.883 m Patient Age:    89 years           BP:           136/78 mmHg Patient Gender: M                  HR:           67 bpm. Exam Location:  [address]  Procedure: 2D Echo, Cardiac Doppler, Color Doppler and 3D Echo  Indications:    1 month s/p TAVR Evaluation Z95.2  History:        Patient has prior history of Echocardiogram examinations, most recent 02/22/2022. CHF, CAD, Pacemaker and Prior CABG; Risk Factors:Hypertension and Dyslipidemia.  Sonographer:    Thurman Coyer RDCS Referring Phys: [phone] JILL D MCDANIEL  IMPRESSIONS   1. Left ventricular ejection fraction, by estimation, is 60 to 65%. The left ventricle has normal function. The left ventricle has no regional wall motion abnormalities. There is mild left ventricular hypertrophy. Left ventricular diastolic parameters are indeterminate. 2. Right ventricular systolic function is low normal. The right ventricular size is normal. There is moderately elevated pulmonary artery systolic pressure. 3. Mild to moderate mitral valve regurgitation. Moderate mitral annular calcification. 4. Tricuspid valve regurgitation is severe. 5. S/p TAVR (26 mm Edwards S3UR, procedure date 02/06/22) Peak and mean gradients through the valve are 15 and 8 mm Hg respectively.. The aortic valve has been repaired/replaced. Aortic valve regurgitation is not visualized. 6. Aortic dilatation noted. There is mild dilatation of the aortic root, measuring 39 mm. There is mild dilatation of the ascending aorta, measuring 40 mm. 7. The inferior vena cava is dilated in size  with >50% respiratory variability, suggesting right atrial pressure of 8 mmHg.  FINDINGS Left Ventricle: Left ventricular ejection fraction, by estimation, is 60 to 65%. The left ventricle has normal function. The left ventricle has no regional wall motion abnormalities. The left ventricular internal cavity size was normal in size. There is mild left ventricular hypertrophy. Left ventricular diastolic parameters are indeterminate.  Right Ventricle: The right ventricular size is normal. Right vetricular wall thickness was not assessed. Right ventricular systolic function is low normal. There is moderately elevated pulmonary artery systolic pressure. The tricuspid regurgitant velocity is 3.06 m/s, and with an assumed right atrial pressure of 8 mmHg, the estimated right ventricular systolic pressure is 45.5 mmHg.  Left Atrium: Left atrial size was normal in size.  Right Atrium: Right atrial size was normal in size.  Pericardium: There is no evidence of pericardial effusion.  Mitral Valve: There is mild thickening of the mitral valve leaflet(s). Moderate mitral annular calcification. Mild to moderate mitral valve regurgitation.  Tricuspid Valve: The tricuspid valve is normal in structure. Tricuspid valve regurgitation is severe.  Aortic Valve: S/p TAVR (26 mm Edwards S3UR, procedure date 02/06/22) Peak and mean gradients through the valve are 15  and 8 mm Hg respectively. The aortic valve has been repaired/replaced. Aortic valve regurgitation is not visualized. Aortic valve mean gradient measures 8.0 mmHg. Aortic valve peak gradient measures 15.4 mmHg. Aortic valve area, by VTI measures 2.56 cm.  Pulmonic Valve: The pulmonic valve was normal in structure. Pulmonic valve regurgitation is mild.  Aorta: Aortic dilatation noted. There is mild dilatation of the aortic root, measuring 39 mm. There is mild dilatation of the ascending aorta, measuring 40 mm.  Venous: The inferior vena cava is dilated in  size with greater than 50% respiratory variability, suggesting right atrial pressure of 8 mmHg.  IAS/Shunts: No atrial level shunt detected by color flow Doppler.   LEFT VENTRICLE PLAX 2D LVIDd:         5.30 cm   Diastology LVIDs:         3.50 cm   LV e' medial:    8.17 cm/s LV PW:         1.10 cm   LV E/e' medial:  17.3 LV IVS:        1.30 cm   LV e' lateral:   11.60 cm/s LVOT diam:     2.40 cm   LV E/e' lateral: 12.2 LV SV:         107 LV SV Index:   57 LVOT Area:     4.52 cm  3D Volume EF: 3D EF:        58 % LV EDV:       152 ml LV ESV:       64 ml LV SV:        88 ml  RIGHT VENTRICLE RV Basal diam:  5.00 cm RV Mid diam:    4.40 cm RV S prime:     10.10 cm/s TAPSE (M-mode): 1.9 cm  LEFT ATRIUM           Index        RIGHT ATRIUM           Index LA diam:      4.90 cm 2.60 cm/m   RA Area:     26.00 cm LA Vol (A2C): 93.0 ml 49.38 ml/m  RA Volume:   77.90 ml  41.36 ml/m LA Vol (A4C): 82.3 ml 43.70 ml/m AORTIC VALVE AV Area (Vmax):    2.49 cm AV Area (Vmean):   2.41 cm AV Area (VTI):     2.56 cm AV Vmax:           196.00 cm/s AV Vmean:          132.000 cm/s AV VTI:            0.419 m AV Peak Grad:      15.4 mmHg AV Mean Grad:      8.0 mmHg LVOT Vmax:         108.00 cm/s LVOT Vmean:        70.400 cm/s LVOT VTI:          0.237 m LVOT/AV VTI ratio: 0.57  AORTA Ao Root diam: 3.30 cm Ao Asc diam:  4.00 cm  MITRAL VALVE                TRICUSPID VALVE MV Area (PHT): 3.91 cm     TR Peak grad:   37.5 mmHg MV Decel Time: 194 msec     TR Vmax:        306.00 cm/s MR Peak grad: 131.3 mmHg MR Mean grad: 77.0 mmHg  SHUNTS MR Vmax:      573.00 cm/s   Systemic VTI:  0.24 m MR Vmean:     402.0 cm/s    Systemic Diam: 2.40 cm MV E velocity: 141.00 cm/s MV A velocity: 99.70 cm/s MV E/A ratio:  1.41  Dietrich Pates MD Electronically signed by Dietrich Pates MD Signature Date/Time: 03/14/2022/9:40:24 PM    Final     CT SCANS  CT CORONARY MORPH W/CTA COR W/SCORE  01/11/2022  Addendum 01/11/2022  8:11 AM ADDENDUM REPORT: 01/11/2022 08:09  EXAM: OVER-READ INTERPRETATION  CT CHEST  The following report is an over-read performed by radiologist Dr. Lesia Hausen [facility] on 01/11/2022. This over-read does not include interpretation of cardiac or coronary anatomy or pathology. The cardiac CTA interpretation by the cardiologist is attached.  COMPARISON:  02/14/2011 chest CT.  FINDINGS: Please see the separate concurrent chest CT angiogram report for details.  IMPRESSION: Please see the separate concurrent chest CT angiogram report for details.   Electronically Signed By: Delbert Phenix M.D. On: 01/11/2022 08:09  Narrative CLINICAL DATA:  6M with severe aortic stenosis being evaluated for a TAVR procedure.  EXAM: Cardiac TAVR CT  TECHNIQUE: The patient was scanned on a [employer]. A 120 kV retrospective scan was triggered in the descending thoracic aorta at 111 HU's. Gantry rotation speed was 250 msecs and collimation was .6 mm. No beta blockade or nitro were given. The 3D data set was reconstructed in 5% intervals of the R-R cycle. Systolic and diastolic phases were analyzed on a dedicated work station using MPR, MIP and VRT modes. The patient received 100 cc of contrast.  FINDINGS: Aortic Root:  Aortic valve: Trileaflet  Aortic valve calcium score: 1870  Aortic annulus:  Diameter: 28mm x 24mm  Perimeter: 80mm  Area: 497 mm^2  Calcifications: No calcifications  Coronary height: Min Left - 11mm, Min Right - 11mm  Sinotubular height: Left cusp - 18mm; Right cusp - 17mm; Noncoronary cusp - 21mm  LVOT (as measured 3 mm below the annulus):  Diameter: 30mm x 23mm  Area: 491 mm^2  Calcifications: No calcifications  Aortic sinus width: Left cusp - 31mm; Right cusp - 30mm; Noncoronary cusp - 32mm  Sinotubular junction width: 29mm x 27mm  Optimum Fluoroscopic Angle for Delivery: LAO 2 CAU  18  Cardiac:  Right atrium: Mild enlargement  Right ventricle: Normal size  Pulmonary arteries: Normal size  Pulmonary veins: Normal configuration  Left atrium: Mild enlargement.  PFO  Left ventricle: Moderate hypertrophy. Normal size. Basal to mid inferior akinesis.  Pericardium: Normal thickness  Coronary arteries: S/p CABG with patent LIMA-LAD. SVG-PDA and SVG-OM1 are occluded  IMPRESSION: 1. Trileaflet aortic valve with moderate calcifications (AV calcium score 1870)  2. Aortic annulus measures 28mm x 24mm in diameter with perimeter 80mm and area 419mm^2. No annular or LVOT calcifications. Annular measurements are suitable for delivery of 26mm Edwards Sapien 3 valve.  3.  Low coronary height to left main (11mm) and RCA (11mm)  4.  Optimum Fluoroscopic Angle for Delivery:  LAO 2 CAU 18  5.  S/p CABG with patent LIMA-LAD.  SVG-PDA and SVG-OM1 are occluded  6.  Basal to mid LV inferior wall akinesis.  Electronically Signed: By: Epifanio Lesches M.D. On: 01/10/2022 12:18           EKG:        Recent Labs: 02/27/2022: B Natriuretic Peptide 490.4 02/28/2022: Magnesium 2.4 04/03/2022: TSH 1.940 06/02/2022: ALT 7; Platelets 260 06/20/2022:  BUN 72; Creatinine, Ser 2.54; Hemoglobin 10.8; Potassium 3.7; Sodium 137  Recent Lipid Panel    Component Value Date/Time   CHOL 122 09/04/2016 1123   TRIG 59.0 09/04/2016 1123   TRIG 107 01/16/2006 1338   HDL 40.40 09/04/2016 1123   CHOLHDL 3 09/04/2016 1123   VLDL 11.8 09/04/2016 1123   LDLCALC 70 09/04/2016 1123     Risk Assessment/Calculations:                Physical Exam:    VS:  BP 120/60   Pulse 81   Ht 5\' 10"  (1.778 m)   Wt 150 lb (68 kg)   SpO2 96%   BMI 21.52 kg/m     Wt Readings from Last 3 Encounters:  07/09/22 150 lb (68 kg)  06/08/22 149 lb (67.6 kg)  06/02/22 141 lb (64 kg)     GEN:  Well nourished, well developed pleasant elderly male in no acute distress HEENT: Normal NECK:  No JVD; No carotid bruits LYMPHATICS: No lymphadenopathy CARDIAC: RRR, no murmurs, rubs, gallops RESPIRATORY:  Clear to auscultation without rales, wheezing or rhonchi  ABDOMEN: Soft, non-tender, non-distended MUSCULOSKELETAL:  1+ bilateral pedal edema; No deformity  SKIN: Warm and dry NEUROLOGIC:  Alert and oriented x 3 PSYCHIATRIC:  Normal affect   ASSESSMENT:    1. S/P TAVR (transcatheter aortic valve replacement)   2. Primary hypertension   3. Stage 3b chronic kidney disease (HCC)   4. Paroxysmal atrial fibrillation (HCC)    PLAN:    In order of problems listed above:  Stable valve function on 30-day postprocedural echo.  Use SBE prophylaxis when indicated.  Overall clinically stable. Blood pressure now well-controlled hold on multidrug therapy.  Patient is treated with hydralazine, carvedilol, amlodipine, and furosemide.  Metabolic panel followed by [facility]. Followed by nephrology. Extremely low AF burden of <0.1%, asymptomatic. Hx epistaxis. Poor candidate for OAC. Continue observation and surveillance with pacemaker device monitoring.      Medication Adjustments/Labs and Tests Ordered: Current medicines are reviewed at length with the patient today.  Concerns regarding medicines are outlined above.  No orders of the defined types were placed in this encounter.  No orders of the defined types were placed in this encounter.   Patient Instructions  Medication Instructions:  Your physician recommends that you continue on your current medications as directed. Please refer to the Current Medication list given to you today.  *If you need a refill on your cardiac medications before your next appointment, please call your pharmacy*  Follow-Up: At [facility], you and your health needs are our priority.  As part of our continuing mission to provide you with exceptional heart care, we have created designated Provider Care Teams.  These Care Teams include your  primary Cardiologist (physician) and Advanced Practice Providers (APPs -  Physician Assistants and Nurse Practitioners) who all work together to provide you with the care you need, when you need it.  We recommend signing up for the patient portal called "MyChart".  Sign up information is provided on this After Visit Summary.  MyChart is used to connect with patients for Virtual Visits (Telemedicine).  Patients are able to view lab/test results, encounter notes, upcoming appointments, etc.  Non-urgent messages can be sent to your provider as well.   To learn more about what you can do with MyChart, go to [email or web address].    Your next appointment:   6 month(s)  Provider:   Tonny Bollman, MD  Signed, Tonny Bollman, MD  07/09/2022 5:43 PM    [facility]

## 2022-07-10 ENCOUNTER — Ambulatory Visit (INDEPENDENT_AMBULATORY_CARE_PROVIDER_SITE_OTHER): Payer: Medicare Other

## 2022-07-10 DIAGNOSIS — I441 Atrioventricular block, second degree: Secondary | ICD-10-CM

## 2022-07-10 LAB — CUP PACEART REMOTE DEVICE CHECK
Battery Impedance: 1626 Ohm
Battery Remaining Longevity: 42 mo
Battery Voltage: 2.75 V
Brady Statistic AP VP Percent: 12 %
Brady Statistic AP VS Percent: 79 %
Brady Statistic AS VP Percent: 0 %
Brady Statistic AS VS Percent: 9 %
Date Time Interrogation Session: 20240702104031
Implantable Lead Connection Status: 753985
Implantable Lead Connection Status: 753985
Implantable Lead Implant Date: 20140321
Implantable Lead Implant Date: 20140321
Implantable Lead Location: 753859
Implantable Lead Location: 753860
Implantable Lead Model: 5076
Implantable Lead Model: 5092
Implantable Pulse Generator Implant Date: 20140321
Lead Channel Impedance Value: 418 Ohm
Lead Channel Impedance Value: 733 Ohm
Lead Channel Pacing Threshold Amplitude: 0.625 V
Lead Channel Pacing Threshold Amplitude: 0.875 V
Lead Channel Pacing Threshold Pulse Width: 0.4 ms
Lead Channel Pacing Threshold Pulse Width: 0.4 ms
Lead Channel Setting Pacing Amplitude: 2 V
Lead Channel Setting Pacing Amplitude: 2.5 V
Lead Channel Setting Pacing Pulse Width: 0.4 ms
Lead Channel Setting Sensing Sensitivity: 5.6 mV
Zone Setting Status: 755011
Zone Setting Status: 755011

## 2022-07-11 ENCOUNTER — Encounter (HOSPITAL_COMMUNITY)
Admission: RE | Admit: 2022-07-11 | Discharge: 2022-07-11 | Disposition: A | Discharge status: Home / Self Care | Payer: Medicare Other | Source: Ambulatory Visit | Attending: Cardiology | Admitting: Cardiology

## 2022-07-11 DIAGNOSIS — Z952 Presence of prosthetic heart valve: Secondary | ICD-10-CM | POA: Insufficient documentation

## 2022-07-13 ENCOUNTER — Encounter (HOSPITAL_COMMUNITY)
Admission: RE | Admit: 2022-07-13 | Discharge: 2022-07-13 | Disposition: A | Discharge status: Home / Self Care | Payer: Medicare Other | Source: Ambulatory Visit | Attending: Cardiovascular Disease | Admitting: Cardiovascular Disease

## 2022-07-13 DIAGNOSIS — Z952 Presence of prosthetic heart valve: Secondary | ICD-10-CM

## 2022-07-16 ENCOUNTER — Encounter: Payer: Self-pay | Admitting: Urology

## 2022-07-16 ENCOUNTER — Encounter (HOSPITAL_COMMUNITY)
Admission: RE | Admit: 2022-07-16 | Discharge: 2022-07-16 | Disposition: A | Discharge status: Home / Self Care | Payer: Medicare Other | Source: Ambulatory Visit | Attending: Cardiovascular Disease | Admitting: Cardiovascular Disease

## 2022-07-16 DIAGNOSIS — Z952 Presence of prosthetic heart valve: Secondary | ICD-10-CM | POA: Diagnosis not present

## 2022-07-18 ENCOUNTER — Ambulatory Visit (HOSPITAL_COMMUNITY)
Admission: RE | Admit: 2022-07-18 | Discharge: 2022-07-18 | Disposition: A | Discharge status: Home / Self Care | Payer: Medicare Other | Source: Ambulatory Visit | Attending: Nephrology | Admitting: Nephrology

## 2022-07-18 ENCOUNTER — Encounter (HOSPITAL_COMMUNITY): Payer: Medicare Other

## 2022-07-18 VITALS — BP 114/61 | HR 60 | Temp 97.8°F | Resp 18

## 2022-07-18 DIAGNOSIS — N183 Chronic kidney disease, stage 3 unspecified: Secondary | ICD-10-CM | POA: Diagnosis present

## 2022-07-18 LAB — IRON AND TIBC
Iron: 81 ug/dL (ref 45–182)
Saturation Ratios: 29 % (ref 17.9–39.5)
TIBC: 277 ug/dL (ref 250–450)
UIBC: 196 ug/dL

## 2022-07-18 LAB — RENAL FUNCTION PANEL
Albumin: 3.4 g/dL — ABNORMAL LOW (ref 3.5–5.0)
Anion gap: 9 (ref 5–15)
BUN: 77 mg/dL — ABNORMAL HIGH (ref 8–23)
CO2: 21 mmol/L — ABNORMAL LOW (ref 22–32)
Calcium: 9.1 mg/dL (ref 8.9–10.3)
Chloride: 105 mmol/L (ref 98–111)
Creatinine, Ser: 2.65 mg/dL — ABNORMAL HIGH (ref 0.61–1.24)
GFR, Estimated: 22 mL/min — ABNORMAL LOW (ref >60)
Glucose, Bld: 98 mg/dL (ref 70–99)
Phosphorus: 3.3 mg/dL (ref 2.5–4.6)
Potassium: 4.2 mmol/L (ref 3.5–5.1)
Sodium: 135 mmol/L (ref 135–145)

## 2022-07-18 LAB — POCT HEMOGLOBIN-HEMACUE: Hemoglobin: 10.4 g/dL — ABNORMAL LOW (ref 13.0–17.0)

## 2022-07-18 MED ORDER — EPOETIN ALFA-EPBX 10000 UNIT/ML IJ SOLN
INTRAMUSCULAR | Status: AC
  Administered 2022-07-18: 20000 [IU] via SUBCUTANEOUS
  Filled 2022-07-18: qty 2

## 2022-07-18 MED ORDER — EPOETIN ALFA-EPBX 10000 UNIT/ML IJ SOLN: 20000.0000 [IU] | INTRAMUSCULAR | Status: DC

## 2022-07-20 ENCOUNTER — Encounter (HOSPITAL_COMMUNITY)
Admission: RE | Admit: 2022-07-20 | Discharge: 2022-07-20 | Disposition: A | Discharge status: Home / Self Care | Payer: Medicare Other | Source: Ambulatory Visit | Attending: Cardiovascular Disease | Admitting: Cardiovascular Disease

## 2022-07-20 DIAGNOSIS — Z952 Presence of prosthetic heart valve: Secondary | ICD-10-CM

## 2022-07-23 ENCOUNTER — Encounter (HOSPITAL_COMMUNITY)
Admission: RE | Admit: 2022-07-23 | Discharge: 2022-07-23 | Disposition: A | Discharge status: Home / Self Care | Payer: Medicare Other | Source: Ambulatory Visit | Attending: Cardiovascular Disease | Admitting: Cardiovascular Disease

## 2022-07-23 VITALS — Ht 67.52 in | Wt 155.6 lb

## 2022-07-23 DIAGNOSIS — Z952 Presence of prosthetic heart valve: Secondary | ICD-10-CM

## 2022-07-25 ENCOUNTER — Encounter (HOSPITAL_COMMUNITY): Payer: Medicare Other

## 2022-07-26 ENCOUNTER — Ambulatory Visit (INDEPENDENT_AMBULATORY_CARE_PROVIDER_SITE_OTHER): Payer: Medicare Other

## 2022-07-26 DIAGNOSIS — E538 Deficiency of other specified B group vitamins: Secondary | ICD-10-CM | POA: Diagnosis not present

## 2022-07-26 MED ORDER — CYANOCOBALAMIN 1000 MCG/ML IJ SOLN
1000.0000 ug | Freq: Once | INTRAMUSCULAR | Status: AC
Start: 2022-07-26 — End: 2022-07-26
  Administered 2022-07-26: 1000 ug via INTRAMUSCULAR

## 2022-07-26 NOTE — Progress Notes (Signed)
Patient here for monthly B12 injection per Dr. Jones.  B12 1000 mcg given in left IM and patient tolerated injection well today.  

## 2022-07-27 ENCOUNTER — Encounter (HOSPITAL_COMMUNITY): Payer: Medicare Other

## 2022-07-27 ENCOUNTER — Telehealth (HOSPITAL_COMMUNITY): Payer: Self-pay

## 2022-07-27 NOTE — Telephone Encounter (Signed)
Called pt to inform of no CRP2 due to telemetry downtime. No answer, LM on VM.  Jonna Coup, MS, ACSM-CEP 07/27/2022 9:55 AM

## 2022-07-30 ENCOUNTER — Encounter (HOSPITAL_COMMUNITY)
Admission: RE | Admit: 2022-07-30 | Discharge: 2022-07-30 | Disposition: A | Discharge status: Home / Self Care | Payer: Medicare Other | Source: Ambulatory Visit | Attending: Cardiovascular Disease | Admitting: Cardiovascular Disease

## 2022-07-30 DIAGNOSIS — Z952 Presence of prosthetic heart valve: Secondary | ICD-10-CM

## 2022-07-30 NOTE — Progress Notes (Addendum)
Discharge Progress Report  Patient Details  Name: [name] MRN: [id number] Date of Birth: 03/03/1926 Referring Provider:   Flowsheet Row INTENSIVE CARDIAC REHAB ORIENT from 05/17/2022 in [facility]  Referring Provider Dr. Tonny Bollman, MD        Number of Visits: 36  Reason for Discharge:  Patient reached a stable level of exercise. Patient has met program and personal goals.  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 Tonny Bollman, MD    Expected Discharge Date 07/27/22      NuStep   Level 1    SPM 85    Minutes 25    METs 1.9      Prescription Details   Frequency (times per week) 3    Duration Progress to 30 minutes of continuous aerobic without signs/symptoms of physical distress      Intensity   THRR 40-80% of Max Heartrate 51-102    Ratings of Perceived Exertion 11-13    Perceived Dyspnea 0-4      Progression   Progression Continue progressive overload as per policy without signs/symptoms or physical distress.      Resistance Training   Training Prescription Yes    Weight 2    Reps 10-15             Discharge Exercise Prescription (Final Exercise Prescription Changes):  Exercise Prescription Changes - 07/30/22 1659       Response to Exercise   Blood Pressure (Admit) 104/56    Blood Pressure (Exercise) 116/62    Blood Pressure (Exit) 122/68    Heart Rate (Admit) 67 bpm    Heart Rate (Exercise) 111 bpm    Heart Rate (Exit) 63 bpm    Rating of Perceived Exertion (Exercise) 13    Perceived Dyspnea (Exercise) 0    Symptoms 0    Comments Pt graduated the  [facility] program    Duration Progress to 30 minutes of  aerobic without signs/symptoms of physical distress    Intensity THRR unchanged      Progression   Progression Continue to progress workloads to maintain intensity without signs/symptoms of physical distress.    Average METs 2.1      Resistance Training   Training Prescription Yes    Weight 3 lbs    Reps 10-15    Time 10 Minutes      NuStep   Level 2   previous mets, because pt did not report MET's today   SPM 81    Minutes 6    METs 2.1             Functional Capacity:  6 Minute Walk     Row Name 05/17/22 1543 07/23/22 1630       6 Minute Walk   Phase Initial  Nustep test due to poor vision Discharge    Distance 1476 feet 1738 feet    Distance % Change -- 17.75 %    Distance Feet Change -- 262 ft    Walk Time 6 minutes 6 minutes    # of Rest Breaks 0 0  MPH 2.8 3.29    METS 2.49 2.87    RPE 12 13    Perceived Dyspnea  0 0    VO2 Peak 8.72 10.04    Symptoms Yes (comment) Yes (comment)    Comments 2 mins in decreased SPM due to high HR, asymptomatic. Bilateral knee to foot pain 4/10, resolved with rest. AVG km 0.45, [address] [place]: 28, AVG SPM 86, total steps 519, total feet: 1476 decreased SPM due to high HR, asymptomatic. Bilateral knee to foot pain 6/10, did not resolve with rest. Bilateral knee pain 5/10, resolved with rest. AVG km 0.53, [address] [place]: 30, AVG SPM 98, total steps 604, total feet: 1738    Resting HR 68 bpm 71 bpm    Resting BP 132/74 120/60    Resting Oxygen Saturation  99 % --    Exercise Oxygen Saturation  during 6 min walk 100 % --    Max Ex. HR 117 bpm 114 bpm    Max Ex. BP 154/66 144/64    2 Minute Post BP 146/72 150/70             6 Minute Walk     Row Name 05/17/22 1543 07/23/22 1630       6 Minute Walk   Phase Initial  Nustep test due to poor vision Discharge    Distance 1476 feet 1738 feet    Distance % Change -- 17.75 %    Distance Feet Change -- 262 ft    Walk Time 6  minutes 6 minutes    # of Rest Breaks 0 0    MPH 2.8 3.29    METS 2.49 2.87    RPE 12 13    Perceived Dyspnea  0 0    VO2 Peak 8.72 10.04    Symptoms Yes (comment) Yes (comment)    Comments 2 mins in decreased SPM due to high HR, asymptomatic. Bilateral knee to foot pain 4/10, resolved with rest. AVG km 0.45, [address] [place]: 28, AVG SPM 86, total steps 519, total feet: 1476 decreased SPM due to high HR, asymptomatic. Bilateral knee to foot pain 6/10, did not resolve with rest. Bilateral knee pain 5/10, resolved with rest. AVG km 0.53, [address] [place]: 30, AVG SPM 98, total steps 604, total feet: 1738    Resting HR 68 bpm 71 bpm    Resting BP 132/74 120/60    Resting Oxygen Saturation  99 % --    Exercise Oxygen Saturation  during 6 min walk 100 % --    Max Ex. HR 117 bpm 114 bpm    Max Ex. BP 154/66 144/64    2 Minute Post BP 146/72 150/70             Psychological, QOL, Others - Outcomes: PHQ 2/9:    07/30/2022    4:09 PM 05/17/2022    2:09 PM 03/09/2022    2:27 PM 11/03/2021    2:14 PM 08/02/2021    4:13 PM  Depression screen PHQ 2/9  Decreased Interest 0 0 0 0 1  Down, Depressed, Hopeless 0 0 0 0 0  PHQ - 2 Score 0 0 0 0 1  Altered sleeping 0 0   3  Tired, decreased energy 1 3   2   Change in appetite 0 0   0  Feeling bad or failure about yourself  0 1   0  Trouble concentrating 1 0   0  Moving slowly or fidgety/restless 0 1  1  Suicidal thoughts 0 0   0  PHQ-9 Score 2 5   7   Difficult doing work/chores Not difficult at all Not difficult at all   Somewhat difficult    Quality of Life:  Quality of Life - 07/30/22 1653       Quality of Life   Select Quality of Life      Quality of Life Scores   Health/Function Post 14.31 %    Socioeconomic Post 29 %    Psych/Spiritual Post 23.14 %    Family Post 28.5 %    GLOBAL Post 21.2 %             Personal Goals: Goals established at orientation with interventions provided to work toward goal.  Personal Goals and Risk Factors  at Admission - 05/17/22 1603       Core Components/Risk Factors/Patient Goals on Admission   Hypertension Yes    Intervention Provide education on lifestyle modifcations including regular physical activity/exercise, weight management, moderate sodium restriction and increased consumption of fresh fruit, vegetables, and low fat dairy, alcohol moderation, and smoking cessation.;Monitor prescription use compliance.    Expected Outcomes Short Term: Continued assessment and intervention until BP is < 140/25mm HG in hypertensive participants. < 130/16mm HG in hypertensive participants with diabetes, heart failure or chronic kidney disease.;Long Term: Maintenance of blood pressure at goal levels.    Lipids Yes    Intervention Provide education and support for participant on nutrition & aerobic/resistive exercise along with prescribed medications to achieve LDL 70mg , HDL >40mg .    Expected Outcomes Short Term: Participant states understanding of desired cholesterol values and is compliant with medications prescribed. Participant is following exercise prescription and nutrition guidelines.;Long Term: Cholesterol controlled with medications as prescribed, with individualized exercise RX and with personalized nutrition plan. Value goals: LDL < 70mg , HDL > 40 mg.    Stress Yes    Intervention Offer individual and/or small group education and counseling on adjustment to heart disease, stress management and health-related lifestyle change. Teach and support self-help strategies.;Refer participants experiencing significant psychosocial distress to appropriate mental health specialists for further evaluation and treatment. When possible, include family members and significant others in education/counseling sessions.    Expected Outcomes Short Term: Participant demonstrates changes in health-related behavior, relaxation and other stress management skills, ability to obtain effective social support, and compliance with  psychotropic medications if prescribed.;Long Term: Emotional wellbeing is indicated by absence of clinically significant psychosocial distress or social isolation.    Personal Goal Other Yes    Personal Goal Short term: increase QOL Long term: stamina    Intervention Will continue to monitor pt and progress workloads as tolerated without sign or symptom    Expected Outcomes Pt will achieve his goals              Personal Goals Discharge:  Goals and Risk Factor Review     Row Name 06/11/22 1721 07/05/22 1346           Core Components/Risk Factors/Patient Goals Review   Personal Goals Review Weight Management/Obesity;Hypertension;Lipids;Stress Weight Management/Obesity;Hypertension;Lipids;Stress      Review [name] started intensive cardiac rehab and did well with exercise for his fitness level. Vital signs were stable. Power is decondtioned and uses a cane [name] is doing well with exercise at  intensive cardiac rehab.  Vital signs have been  stable. [name] has lost 4.6 kg since starting cardiac rehab      Expected Outcomes [name] will continue to participate in  intensive cardiac rehab for exercise, nutrtiion and lifestyle modifications [name] will continue to participate in intensive cardiac rehab for exercise, nutrtiion and lifestyle modifications               Goals and Risk Factor Review     Row Name 06/11/22 1721 07/05/22 1346           Core Components/Risk Factors/Patient Goals Review   Personal Goals Review Weight Management/Obesity;Hypertension;Lipids;Stress Weight Management/Obesity;Hypertension;Lipids;Stress      Review [name] started intensive cardiac rehab and did well with exercise for his fitness level. Vital signs were stable. Jakorey is decondtioned and uses a cane [name] is doing well with exercise at  intensive cardiac rehab.  Vital signs have been  stable. [name] has lost 4.6 kg since starting cardiac rehab      Expected Outcomes [name] will continue to participate in  intensive cardiac rehab for exercise, nutrtiion and lifestyle modifications [name] will continue to participate in intensive cardiac rehab for exercise, nutrtiion and lifestyle modifications               Exercise Goals and Review:  Exercise Goals     Row Name 05/17/22 1554             Exercise Goals   Increase Physical Activity Yes       Intervention Provide advice, education, support and counseling about physical activity/exercise needs.;Develop an individualized exercise prescription for aerobic and resistive training based on initial evaluation findings, risk stratification, comorbidities and participant's personal goals.       Expected Outcomes Short Term: Attend rehab on a regular basis to increase amount of physical activity.;Long Term: Exercising regularly at least 3-5 days a week.;Long Term: Add in home exercise to make exercise part of routine and to increase amount of physical activity.       Increase Strength and Stamina Yes       Intervention Provide advice, education, support and counseling about physical activity/exercise needs.;Develop an individualized exercise prescription for aerobic and resistive training based on initial evaluation findings, risk stratification, comorbidities and participant's personal goals.       Expected Outcomes Short Term: Increase workloads from initial exercise prescription for resistance, speed, and METs.;Short Term: Perform resistance training exercises routinely during rehab and add in resistance training at home;Long Term: Improve cardiorespiratory fitness, muscular endurance and strength as measured by increased METs and functional capacity ( )       Able to understand and use rate of perceived exertion (RPE) scale Yes       Intervention Provide education and explanation on how to use RPE scale       Expected Outcomes Short Term: Able to use RPE daily in rehab to express subjective intensity level;Long Term:  Able to use RPE to guide  intensity level when exercising independently       Knowledge and understanding of Target Heart Rate Range (THRR) Yes       Intervention Provide education and explanation of THRR including how the numbers were predicted and where they are located for reference       Expected Outcomes Short Term: Able to state/look up THRR;Long Term: Able to use THRR to govern intensity when exercising independently;Short Term: Able to use daily as guideline for intensity in rehab       Understanding of Exercise Prescription Yes       Intervention Provide education, explanation, and written materials on patient's individual exercise prescription       Expected Outcomes Short Term:  Able to explain program exercise prescription;Long Term: Able to explain home exercise prescription to exercise independently                Exercise Goals     Row Name 05/17/22 1554             Exercise Goals   Increase Physical Activity Yes       Intervention Provide advice, education, support and counseling about physical activity/exercise needs.;Develop an individualized exercise prescription for aerobic and resistive training based on initial evaluation findings, risk stratification, comorbidities and participant's personal goals.       Expected Outcomes Short Term: Attend rehab on a regular basis to increase amount of physical activity.;Long Term: Exercising regularly at least 3-5 days a week.;Long Term: Add in home exercise to make exercise part of routine and to increase amount of physical activity.       Increase Strength and Stamina Yes       Intervention Provide advice, education, support and counseling about physical activity/exercise needs.;Develop an individualized exercise prescription for aerobic and resistive training based on initial evaluation findings, risk stratification, comorbidities and participant's personal goals.       Expected Outcomes Short Term: Increase workloads from initial exercise prescription  for resistance, speed, and METs.;Short Term: Perform resistance training exercises routinely during rehab and add in resistance training at home;Long Term: Improve cardiorespiratory fitness, muscular endurance and strength as measured by increased METs and functional capacity ( )       Able to understand and use rate of perceived exertion (RPE) scale Yes       Intervention Provide education and explanation on how to use RPE scale       Expected Outcomes Short Term: Able to use RPE daily in rehab to express subjective intensity level;Long Term:  Able to use RPE to guide intensity level when exercising independently       Knowledge and understanding of Target Heart Rate Range (THRR) Yes       Intervention Provide education and explanation of THRR including how the numbers were predicted and where they are located for reference       Expected Outcomes Short Term: Able to state/look up THRR;Long Term: Able to use THRR to govern intensity when exercising independently;Short Term: Able to use daily as guideline for intensity in rehab       Understanding of Exercise Prescription Yes       Intervention Provide education, explanation, and written materials on patient's individual exercise prescription       Expected Outcomes Short Term: Able to explain program exercise prescription;Long Term: Able to explain home exercise prescription to exercise independently                Exercise Goals Re-Evaluation:  Exercise Goals Re-Evaluation     Row Name 06/11/22 1720 07/02/22 1711 07/23/22 1630 07/25/22 0842 07/30/22 1702     Exercise Goal Re-Evaluation   Exercise Goals Review Increase Physical Activity;Understanding of Exercise Prescription;Increase Strength and Stamina;Knowledge and understanding of Target Heart Rate Range (THRR);Able to understand and use rate of perceived exertion (RPE) scale Increase Physical Activity;Understanding of Exercise Prescription;Increase Strength and Stamina;Knowledge and  understanding of Target Heart Rate Range (THRR);Able to understand and use rate of perceived exertion (RPE) scale Increase Physical Activity;Understanding of Exercise Prescription;Increase Strength and Stamina;Knowledge and understanding of Target Heart Rate Range (THRR);Able to understand and use rate of perceived exertion (RPE) scale -- Increase Physical Activity;Understanding of Exercise Prescription;Increase Strength and Stamina;Knowledge and understanding of  Target Heart Rate Range (THRR);Able to understand and use rate of perceived exertion (RPE) scale   Comments Pt first day in the [facility] program. Pt tolerated exercise well with an average MET level of 2.65. Pt is learning her THRR, RPE and ExRx. Off to a great start. Reviewed MET's and goals. Pt tolerated exercise well with an average MET level of 2.0. Pt says he still has many aches and pains. He has a lot of soreness and issues with his legs and the soles of his feel that limit him, but overall he's doing ok. Encouraged him that he's doing well and is increasing his SPM and time from 20 to 22 mins. He will work toward 25 mins when he feels able. But right now limited by fatigue. Reviewed MET's and goals and post 6 MIN stepper test. Pt tolerated exercise well with an average MET level of 2.87. Pt continues to be very limited by chronic foot and knee pain and fatigue. Talked about him continuing his exercise by joining [employer] after graduation to help his balance training. He and his wife seem very motivated. -- Pt graduated teh [employer] program/. Pt tolerated exercise well with an average MET level of 2.1. Talked about him about continuing his exercise by joining [employer] to help his balance training. He and his wife seem motivated.   Expected Outcomes Will continue to monitor pt and progress workloads as tolerated without sign or symptom Will continue to monitor pt and progress workloads as tolerated without sign or symptom Will continue to monitor pt and  progress workloads as tolerated without sign or symptom -- Pt will continue to exercise on his own and gain strength            Exercise Goals Re-Evaluation     Row Name 06/11/22 1720 07/02/22 1711 07/23/22 1630 07/25/22 0842 07/30/22 1702     Exercise Goal Re-Evaluation   Exercise Goals Review Increase Physical Activity;Understanding of Exercise Prescription;Increase Strength and Stamina;Knowledge and understanding of Target Heart Rate Range (THRR);Able to understand and use rate of perceived exertion (RPE) scale Increase Physical Activity;Understanding of Exercise Prescription;Increase Strength and Stamina;Knowledge and understanding of Target Heart Rate Range (THRR);Able to understand and use rate of perceived exertion (RPE) scale Increase Physical Activity;Understanding of Exercise Prescription;Increase Strength and Stamina;Knowledge and understanding of Target Heart Rate Range (THRR);Able to understand and use rate of perceived exertion (RPE) scale -- Increase Physical Activity;Understanding of Exercise Prescription;Increase Strength and Stamina;Knowledge and understanding of Target Heart Rate Range (THRR);Able to understand and use rate of perceived exertion (RPE) scale   Comments Pt first day in the [facility] program. Pt tolerated exercise well with an average MET level of 2.65. Pt is learning her THRR, RPE and ExRx. Off to a great start. Reviewed MET's and goals. Pt tolerated exercise well with an average MET level of 2.0. Pt says he still has many aches and pains. He has a lot of soreness and issues with his legs and the soles of his feel that limit him, but overall he's doing ok. Encouraged him that he's doing well and is increasing his SPM and time from 20 to 22 mins. He will work toward 25 mins when he feels able. But right now limited by fatigue. Reviewed MET's and goals and post 6 MIN stepper test. Pt tolerated exercise well with an average MET level of 2.87. Pt continues to be very limited by  chronic foot and knee pain and fatigue. Talked about him continuing his exercise by joining  [employer] after graduation to help his balance training. He and his wife seem very motivated. -- Pt graduated teh [employer] program/. Pt tolerated exercise well with an average MET level of 2.1. Talked about him about continuing his exercise by joining [employer] to help his balance training. He and his wife seem motivated.   Expected Outcomes Will continue to monitor pt and progress workloads as tolerated without sign or symptom Will continue to monitor pt and progress workloads as tolerated without sign or symptom Will continue to monitor pt and progress workloads as tolerated without sign or symptom -- Pt will continue to exercise on his own and gain strength            Nutrition & Weight - Outcomes:  Pre Biometrics - 07/25/22 0846       Pre Biometrics   Height --    Weight --    Waist Circumference --    Hip Circumference --    Waist to Hip Ratio --    BMI (Calculated) --    Triceps Skinfold --    % Body Fat --    Grip Strength --    Flexibility --    Single Leg Stand --              Nutrition:  Nutrition Therapy & Goals - 07/09/22 1617       Nutrition Therapy   Diet Heart healthy diet      Personal Nutrition Goals   Nutrition Goal Patient to identify strategies for reducing cardiovascular risk by attending the [facility] education and nutrition series weekly.    Personal Goal #2 Patient to identify strategies for weight gain/maintenance of 0.5-2.0# per week.    Comments [name] and his wife continue to attend the [employer] education and nutrition series regularly. He and his wife report regularly monitoring his weight at home and report following a low sodium diet. His weight is up ~8.8# since his lowest weight of 64.8kg on 06/18/22.  His wife is very supportive. She reports that his intake is variable. We have discussed multiple strategies for weight maintenace/appropriate weight gain  including increasing calories, increasing eating frequency, nutrition supplements, etc.  He continues regular follow-up with nephrology. Patient will benefit from participation in intensive cardiac rehab for nutrition, exercise, and lifestyle modification.      Intervention Plan   Intervention Prescribe, educate and counsel regarding individualized specific dietary modifications aiming towards targeted core components such as weight, hypertension, lipid management, diabetes, heart failure and other comorbidities.;Nutrition handout(s) given to patient.    Expected Outcomes Short Term Goal: Understand basic principles of dietary content, such as calories, fat, sodium, cholesterol and nutrients.;Long Term Goal: Adherence to prescribed nutrition plan.             Nutrition Discharge:  Nutrition Assessments - 07/30/22 0932       Rate Your Plate Scores   Pre Score 62    Post Score 63             Education Questionnaire Score:  Knowledge Questionnaire Score - 07/30/22 1656       Knowledge Questionnaire Score   Post Score 23/24             Goals reviewed with patient; copy given to patient.Pt graduates from  Intensive/Traditional cardiac rehab program on 07/30/22  with completion of  36 exercise and education sessions. Pt maintained good attendance and progressed nicely during their participation in rehab as evidenced by increased MET level. [name] increased his  post exercise nustep test by 262 feet and lost 2.6 kg  Medication list reconciled. Repeat  PHQ score-2  .  Pt has made significant lifestyle changes and should be commended for their success. [name]  achieved his  goals during cardiac rehab.   Pt plans to continue exercise at home as tolerated and is considering participating in neuro rehab for balance. We are proud of [name] progress!Thayer Headings RN BSN

## 2022-08-03 NOTE — Progress Notes (Signed)
Remote pacemaker transmission.   

## 2022-08-12 ENCOUNTER — Encounter: Payer: Self-pay | Admitting: Internal Medicine

## 2022-08-12 NOTE — Progress Notes (Unsigned)
Subjective:    Patient ID: [name], male    DOB: 1926-12-28, 89 y.o.   MRN: [id number]     HPI [name] is here for follow up of his chronic medical problems.    Medications and allergies reviewed with patient and updated if appropriate.  Current Outpatient Medications on File Prior to Visit  Medication Sig Dispense Refill   acetaminophen (TYLENOL) 500 MG tablet Take 1,000 mg by mouth 3 (three) times daily as needed for moderate pain.     allopurinol (ZYLOPRIM) 100 MG tablet TAKE 1 TABLET(100 MG) BY MOUTH DAILY (Patient taking differently: Take 100 mg by mouth daily. TAKE 1 TABLET(100 MG) BY MOUTH DAILY) 30 tablet 0   amLODipine (NORVASC) 5 MG tablet Take 1 tablet (5 mg total) by mouth daily. 90 tablet 1   amoxicillin (AMOXIL) 500 MG capsule Take 2,000 mg by mouth as directed. 1 HOUR PRIOR TO DENTAL CLEANINGS AND PROCEDURES     Aromatic Inhalants (VICKS VAPOR INHALER IN) Place 1 puff into both nostrils as needed (for congestion).     Ascorbic Acid (VITAMIN C) 1000 MG tablet Take 1,000 mg by mouth daily.     aspirin EC 81 MG tablet Take 81 mg by mouth daily.     Calcium Citrate-Vitamin D (CITRACAL + D PO) Take 2 tablets by mouth in the morning and at bedtime.     Carboxymeth-Glycerin-Polysorb (REFRESH OPTIVE MEGA-3 OP) Place 1 drop into both eyes 2 (two) times daily.     carvedilol (COREG) 3.125 MG tablet Take 1 tablet (3.125 mg total) by mouth 2 (two) times daily. 180 tablet 3   Cholecalciferol 25 MCG (1000 UT) capsule Take 1,000 Units by mouth daily.     cyanocobalamin (,VITAMIN B-12,) 1000 MCG/ML injection Inject 1,000 mcg into the muscle once. Monthly injection     folic acid (FOLVITE) 1 MG tablet Take 1 tablet (1 mg total) by mouth daily. Annual appt due in May must see provider for future refills (Patient taking differently: Take 1 mg by mouth at bedtime. Annual appt due in May must see provider for future refills) 90 tablet 2   furosemide (LASIX) 40 MG tablet Take 1  tablet by mouth every Monday, Wednesday, and Friday.  Take an extra pill three times per week prn 72 tablet 3   hydrALAZINE (APRESOLINE) 100 MG tablet Take 1 tablet (100 mg total) by mouth 3 (three) times daily. 90 tablet 5   loratadine (CLARITIN) 10 MG tablet Take 10 mg by mouth daily as needed (for seasonal allergies).     nitroGLYCERIN (NITROSTAT) 0.4 MG SL tablet DISSOLVE 1 TABLET UNDER THE TONGUE EVERY 5 MINUTES AS NEEDED FOR CHEST PAIN, MAXIMUM 3 TABLETS 100 tablet 1   Saline (AYR NASAL MIST ALLERGY/SINUS NA) Place 2 sprays into the nose as needed (dryness).     trolamine salicylate (ASPERCREME) 10 % cream Apply 1 Application topically in the morning and at bedtime. For leg cramps     Vitamin D, Ergocalciferol, (DRISDOL) 1.25 MG (50000 UNIT) CAPS capsule Take 50,000 Units by mouth every 30 (thirty) days. Taking on the 15th of each month.     Current Facility-Administered Medications on File Prior to Visit  Medication Dose Route Frequency Provider Last Rate Last Admin   NON FORMULARY 1 application  1 application  Topical PRN Asencion Islam, DPM         Review of Systems     Objective:  There were no vitals filed for  this visit. BP Readings from Last 3 Encounters:  07/18/22 114/61  07/09/22 120/60  06/20/22 99/70   Wt Readings from Last 3 Encounters:  07/23/22 155 lb 10.3 oz (70.6 kg)  07/09/22 150 lb (68 kg)  06/08/22 149 lb (67.6 kg)   There is no height or weight on file to calculate BMI.    Physical Exam     Lab Results  Component Value Date   WBC 14.6 (H) 06/02/2022   HGB 10.4 (L) 07/18/2022   HCT 29.1 (L) 06/02/2022   PLT 260 06/02/2022   GLUCOSE 98 07/18/2022   CHOL 122 09/04/2016   TRIG 59.0 09/04/2016   HDL 40.40 09/04/2016   LDLCALC 70 09/04/2016   ALT 7 06/02/2022   AST 25 06/02/2022   NA 135 07/18/2022   K 4.2 07/18/2022   CL 105 07/18/2022   CREATININE 2.65 (H) 07/18/2022   BUN 77 (H) 07/18/2022   CO2 21 (L) 07/18/2022   TSH 1.940 04/03/2022    PSA 0.44 07/18/2007   INR 1.1 04/05/2022     Assessment & Plan:    See Problem List for Assessment and Plan of chronic medical problems.

## 2022-08-13 ENCOUNTER — Ambulatory Visit (INDEPENDENT_AMBULATORY_CARE_PROVIDER_SITE_OTHER): Payer: Medicare Other | Admitting: Internal Medicine

## 2022-08-13 VITALS — BP 104/60 | HR 65 | Temp 98.0°F | Ht 67.25 in | Wt 148.0 lb

## 2022-08-13 DIAGNOSIS — R42 Dizziness and giddiness: Secondary | ICD-10-CM

## 2022-08-13 DIAGNOSIS — N184 Chronic kidney disease, stage 4 (severe): Secondary | ICD-10-CM | POA: Diagnosis not present

## 2022-08-13 DIAGNOSIS — C61 Malignant neoplasm of prostate: Secondary | ICD-10-CM

## 2022-08-13 DIAGNOSIS — E538 Deficiency of other specified B group vitamins: Secondary | ICD-10-CM

## 2022-08-13 DIAGNOSIS — E785 Hyperlipidemia, unspecified: Secondary | ICD-10-CM

## 2022-08-13 DIAGNOSIS — M1A00X0 Idiopathic chronic gout, unspecified site, without tophus (tophi): Secondary | ICD-10-CM

## 2022-08-13 DIAGNOSIS — G6289 Other specified polyneuropathies: Secondary | ICD-10-CM

## 2022-08-13 DIAGNOSIS — I10 Essential (primary) hypertension: Secondary | ICD-10-CM

## 2022-08-13 DIAGNOSIS — I5032 Chronic diastolic (congestive) heart failure: Secondary | ICD-10-CM

## 2022-08-13 DIAGNOSIS — R531 Weakness: Secondary | ICD-10-CM

## 2022-08-13 DIAGNOSIS — G4700 Insomnia, unspecified: Secondary | ICD-10-CM

## 2022-08-13 NOTE — Assessment & Plan Note (Signed)
Check lipid panel  No longer on the medication Regular exercise and healthy diet encouraged

## 2022-08-13 NOTE — Assessment & Plan Note (Signed)
Chronic Following with nephrology

## 2022-08-13 NOTE — Assessment & Plan Note (Signed)
Chronic Not currently on any medication States there is fair

## 2022-08-13 NOTE — Patient Instructions (Addendum)
        Medications changes include :   stop amlodipine 5 mg daily.      A referral was ordered for vestibular and balance physical therapy and someone will call you to schedule an appointment.     Return in about 6 months (around 02/13/2023) for follow up.

## 2022-08-13 NOTE — Assessment & Plan Note (Signed)
Chronic Has poor balance Uses cane Has done physical therapy Not currently on any medication

## 2022-08-13 NOTE — Assessment & Plan Note (Addendum)
Chronic Blood pressure on the low side and he is experiencing dizziness He has been taking Lasix every day for the last few days which could be contributing Hold amlodipine for now Continue hydralazine 100 mg 3 times daily, Coreg 3.125 mg twice daily Monitor BP at home and restart amlodipine if needed

## 2022-08-13 NOTE — Assessment & Plan Note (Signed)
Chronic Continue allopurinol 100 mg daily

## 2022-08-13 NOTE — Assessment & Plan Note (Addendum)
Chronic Continue Lasix 40 mg 3 times a week, will take an extra pill as needed Has taken one pill the past two days and today w/o improvement in swelling Appears euvolemic.  Today-mild swelling in ankles

## 2022-08-13 NOTE — Assessment & Plan Note (Signed)
Chronic Following with urology Has had local recurrence, but no metastatic disease

## 2022-08-13 NOTE — Assessment & Plan Note (Addendum)
New Started 2 days ago BP on low side - ? Cause - hold amlodipine  - monitor BP and restart if BP becomes elevated-has been taking Lasix daily for a few days which could be contributing to the low BP and dizziness-will take Lasix Monday Wednesday Friday of this week ? Vestibular - interested in vestibular and balance PT - referred

## 2022-08-13 NOTE — Assessment & Plan Note (Addendum)
Chronic Continue B12 injections monthly Injection given within the last month-due in a couple of weeks

## 2022-08-13 NOTE — Assessment & Plan Note (Signed)
Chronic Worse today Likely multifactorial Will try PT

## 2022-08-15 ENCOUNTER — Ambulatory Visit (HOSPITAL_COMMUNITY)
Admission: RE | Admit: 2022-08-15 | Discharge: 2022-08-15 | Disposition: A | Discharge status: Home / Self Care | Payer: Medicare Other | Source: Ambulatory Visit | Attending: Nephrology | Admitting: Nephrology

## 2022-08-15 VITALS — BP 98/56 | HR 71 | Temp 97.9°F | Resp 18

## 2022-08-15 DIAGNOSIS — N184 Chronic kidney disease, stage 4 (severe): Secondary | ICD-10-CM | POA: Insufficient documentation

## 2022-08-15 LAB — IRON AND TIBC
Iron: 68 ug/dL (ref 45–182)
Saturation Ratios: 24 % (ref 17.9–39.5)
TIBC: 283 ug/dL (ref 250–450)
UIBC: 215 ug/dL

## 2022-08-15 LAB — RENAL FUNCTION PANEL
Albumin: 3.7 g/dL (ref 3.5–5.0)
Anion gap: 14 (ref 5–15)
BUN: 63 mg/dL — ABNORMAL HIGH (ref 8–23)
CO2: 24 mmol/L (ref 22–32)
Calcium: 9.8 mg/dL (ref 8.9–10.3)
Chloride: 98 mmol/L (ref 98–111)
Creatinine, Ser: 2.66 mg/dL — ABNORMAL HIGH (ref 0.61–1.24)
GFR, Estimated: 22 mL/min — ABNORMAL LOW (ref >60)
Glucose, Bld: 89 mg/dL (ref 70–99)
Phosphorus: 3.6 mg/dL (ref 2.5–4.6)
Potassium: 4.5 mmol/L (ref 3.5–5.1)
Sodium: 136 mmol/L (ref 135–145)

## 2022-08-15 LAB — POCT HEMOGLOBIN-HEMACUE: Hemoglobin: 10.5 g/dL — ABNORMAL LOW (ref 13.0–17.0)

## 2022-08-15 MED ORDER — EPOETIN ALFA-EPBX 10000 UNIT/ML IJ SOLN: 20000.0000 [IU] | INTRAMUSCULAR | Status: DC

## 2022-08-15 MED ORDER — EPOETIN ALFA-EPBX 10000 UNIT/ML IJ SOLN
INTRAMUSCULAR | Status: AC
  Administered 2022-08-15: 20000 [IU] via SUBCUTANEOUS
  Filled 2022-08-15: qty 2

## 2022-08-27 ENCOUNTER — Ambulatory Visit (INDEPENDENT_AMBULATORY_CARE_PROVIDER_SITE_OTHER): Payer: Medicare Other | Admitting: *Deleted

## 2022-08-27 ENCOUNTER — Encounter: Payer: Self-pay | Admitting: *Deleted

## 2022-08-27 VITALS — BP 122/68

## 2022-08-27 DIAGNOSIS — E538 Deficiency of other specified B group vitamins: Secondary | ICD-10-CM

## 2022-08-27 MED ORDER — CYANOCOBALAMIN 1000 MCG/ML IJ SOLN
1000.0000 ug | Freq: Once | INTRAMUSCULAR | Status: AC
Start: 2022-08-27 — End: 2022-08-27
  Administered 2022-08-27: 1000 ug via INTRAMUSCULAR

## 2022-08-29 ENCOUNTER — Ambulatory Visit: Payer: Medicare Other | Attending: Internal Medicine | Admitting: Physical Therapy

## 2022-08-29 ENCOUNTER — Other Ambulatory Visit: Payer: Self-pay

## 2022-08-29 ENCOUNTER — Encounter: Payer: Self-pay | Admitting: Physical Therapy

## 2022-08-29 DIAGNOSIS — R531 Weakness: Secondary | ICD-10-CM | POA: Insufficient documentation

## 2022-08-29 DIAGNOSIS — G6289 Other specified polyneuropathies: Secondary | ICD-10-CM | POA: Diagnosis not present

## 2022-08-29 DIAGNOSIS — R42 Dizziness and giddiness: Secondary | ICD-10-CM | POA: Diagnosis not present

## 2022-08-29 DIAGNOSIS — R2681 Unsteadiness on feet: Secondary | ICD-10-CM | POA: Diagnosis not present

## 2022-08-29 DIAGNOSIS — M6281 Muscle weakness (generalized): Secondary | ICD-10-CM | POA: Insufficient documentation

## 2022-08-29 DIAGNOSIS — R2689 Other abnormalities of gait and mobility: Secondary | ICD-10-CM | POA: Insufficient documentation

## 2022-08-29 NOTE — Therapy (Signed)
OUTPATIENT PHYSICAL THERAPY NEURO EVALUATION   Patient Name: [name] MRN: [id number] DOB:1926-12-25, 89 y.o., male Today's Date: 08/29/2022   PCP: Pincus Sanes, MD  REFERRING PROVIDER: Pincus Sanes, MD   END OF SESSION:  PT End of Session - 08/29/22 1735     Visit Number 1    Number of Visits 13    Date for PT Re-Evaluation 10/26/22    Authorization Type Medicare/Tricare    Progress Note Due on Visit 10    PT Start Time 1530    PT Stop Time 1615    PT Time Calculation (min) 45 min    Activity Tolerance Patient tolerated treatment well;Patient limited by fatigue    Behavior During Therapy [facility] for tasks assessed/performed             Past Medical History:  Diagnosis Date   Anemia    AV block, 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 S/P stenting to mid RCA, prox PDA 06/1999. b. NSTEMI/CABG x 3 in 10/2011 with LIMA to LAD, SVG to PDA, and SVG to OM1.    Cephalalgia 07/14/2015   CHB (complete heart block) 05/03/2012   Overview:  Status post complete heart block heart rate 28 bpm, alternating with 2 to one AV block and drug for bradycardia.  Status post pacemaker implantation.status post Medtronic pacemaker implantation the 03/28/2012   Chronic diastolic CHF (congestive heart failure) 02/05/2018   Chronic gouty arthritis    Chronic UTI    a. Followed by Dr. [name] Caprice - colonized/asymptomatic - not on abx   CKD (chronic kidney disease)    stage 3, GFR 30-59 ml/min; stable with a creatinine around 1.9-2.0 followed by nephrology.   Constipation 03/01/2015   Symptoms and exam consistent with constipation. Abdominal exam is benign with no evidence of pain or obstruction. Discussed importance of increasing fiber and water intake coupled with physical activity to assist with bowel movements. Continue over-the-counter medication management as needed. Follow-up if symptoms worsen or fail  to improve.   Coronary atherosclerosis 11/27/2011   Overview:  Multivessel coronary artery disease recently diagnosed by catheterization 2013 History of stent placement with a heparin-coated stent 2001  Last Assessment & Plan:  Status post coronary bypass grafting.  No recurrent chest pain. Overview:  Overview:  S/P stenting to mid RCA, prox PDA 06/1999;  06/2007 Myoview: negative except for apical thinning, EF 68%, last Myoview August 10, 2008 with n   Degeneration of lumbar intervertebral disc    Degenerative disc disease, cervical, with radiculopathy 09/21/2008   Diastolic dysfunction 07/04/2017   Grade 1 DD on Echo 06/2017   Dyslipidemia    Dysphagia 02/06/2016   3/18 - DG Esophagus:  1. Mild esophageal dysmotility, likely presbyesophagus. 2. No other explanation for patient's symptoms. 3. Small hiatal hernia.  On swallow eval - evidence of cervical spine disease and that was likely contributing   Epistaxis 12/08/2020   Essential hypertension 07/20/2006   GERD (gastroesophageal reflux disease)    Gout 05/12/2018   Gouty arthritis of right great toe 09/03/2017   Left toe Injected January 31, 2018    Hyperpigmentation 11/04/2017   Internal hemorrhoids 08/15/2016   Left inguinal hernia 07/01/2019   Lumbar post-laminectomy syndrome 12/12/2017   Moderate aortic regurgitation 07/04/2017   Echo 06/2017:  EF 55-60%, mild LVH, grade 1 DD, mild AS, mod AR, mild MR, mild-mod TR   Neck injury  a. 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  This was switched to by mouth amiodarone at the time  of the thoracic surgery appointment was discontinued.   Presence of aortocoronary bypass graft 10/24/2011   Overview:  Performed at [facility] 2013 Last Assessment & Plan:  No complication post coronary bypass grafting.   Prostate cancer (HCC)    a. 2001 s/p TURP.   Pulmonary nodule    a. felt to be noncancerous.  Status post followup CT scan 4 mm and stable.   Renal artery stenosis    a. 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 Excell Seltzer & Dr. 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   Trochanteric bursitis of hip, bilateral 01/10/2017   UGIB (upper gastrointestinal bleed) 02/01/2018   EGD  02/06/18 - mod, non-erosive gastritis   Venous insufficiency of leg 06/05/2010   Vitamin B12 deficiency 08/23/2009   Jan '14  July '14 B12 level  >1500    472   Weakness of both lower extremities 04/09/2020   Past Surgical History:  Procedure Laterality Date   ANTERIOR CHAMBER WASHOUT Left 10/04/2018   Procedure: Anterior Chamber Washout, Vitreous Tap;  Surgeon: Carmela Rima, MD;  Location: [facility] OR;  Service: Ophthalmology;  Laterality: Left;   cardia catherization  07/07/1999   CARDIAC CATHETERIZATION     CARDIAC SURGERY  10/18/2012   open heart surgery   CATARACT EXTRACTION W/ INTRAOCULAR LENS  IMPLANT, BILATERAL  3/205, 06/2013   mccuen   COLONOSCOPY  04/12/2007   CORONARY ARTERY BYPASS GRAFT  10/19/2011   Procedure: CORONARY ARTERY BYPASS GRAFTING (CABG);  Surgeon: Alleen Borne, MD;  Location: [facility] OR;  Service: Open Heart Surgery;  Laterality: N/A;  times three using Left Internal Mammary Artery and Right Greater Saphenouse Vein Graft harvested Endoscopically   edg  07/17/1994   FLEXIBLE SIGMOIDOSCOPY  11/03/1997   GAS  INSERTION Left 10/04/2018   Procedure: Insertion Of Gas;  Surgeon: Carmela Rima, MD;  Location: [facility] OR;  Service: Ophthalmology;  Laterality: Left;   GAS/FLUID EXCHANGE Left 10/04/2018   Procedure: Gas/Fluid Exchange;  Surgeon: Carmela Rima, MD;  Location: [facility] OR;  Service: Ophthalmology;  Laterality: Left;   INTRAOPERATIVE TRANSTHORACIC ECHOCARDIOGRAM N/A 02/06/2022   Procedure: INTRAOPERATIVE TRANSTHORACIC ECHOCARDIOGRAM;  Surgeon: Tonny Bollman, MD;  Location[facility] INVASIVE CV LAB;  Service: Open Heart Surgery;  Laterality: N/A;   LEFT HEART CATHETERIZATION WITH CORONARY ANGIOGRAM N/A 10/16/2011   Procedure: LEFT HEART CATHETERIZATION WITH CORONARY ANGIOGRAM;  Surgeon: Peter M Swaziland, MD;  Location: [facility] CATH LAB;  Service: Cardiovascular;  Laterality: N/A;   LEFT HEART CATHETERIZATION WITH CORONARY/GRAFT ANGIOGRAM N/A 03/11/2013   Procedure: LEFT HEART CATHETERIZATION WITH [name] Caprice;  Surgeon: Micheline Chapman, MD;  Location: [facility] CATH LAB;  Service: Cardiovascular;  Laterality: N/A;   lumbar spinal disk and neck fusion surgery  PACEMAKER INSERTION  03/28/2012   PPM implanted for mobitz II AV block   PARS PLANA VITRECTOMY Left 10/04/2018   Procedure: PARS PLANA VITRECTOMY 25 GAUGE FOR ENDOPHTHALMITIS WITH INJECTION OF INTRAVITREAL ANTIBIOTIC;  Surgeon: Carmela Rima, MD;  Location: [facility] OR;  Service: Ophthalmology;  Laterality: Left;   peripheral vascular catherization  11/24/2003   PERMANENT PACEMAKER INSERTION N/A 03/28/2012   Procedure: PERMANENT PACEMAKER INSERTION;  Surgeon: Hillis Range, MD;  Location: [facility] CATH LAB;  Service: Cardiovascular;  Laterality: N/A;   PROSTATECTOMY     renal circulation  10/01/2003   RIGHT HEART CATH N/A 03/02/2022   Procedure: RIGHT HEART CATH;  Surgeon: Runell Gess, MD;  Location: [facility] INVASIVE CV LAB;  Service: Cardiovascular;  Laterality: N/A;   s/p ptca     stents     X 2   stress cardiolite  05/04/2005   spring 09-negative  except for apical thinning, EF 68%   TRANSCATHETER AORTIC VALVE REPLACEMENT, TRANSFEMORAL Right 02/06/2022   Procedure: Transcatheter Aortic Valve Replacement, Transfemoral;  Surgeon: Tonny Bollman, MD;  Location: [facility] INVASIVE CV LAB;  Service: Open Heart Surgery;  Laterality: Right;   Patient Active Problem List   Diagnosis Date Noted   Dizziness 08/13/2022   Generalized weakness 08/13/2022   Community acquired pneumonia 06/07/2022   Dementia with psychotic disturbance (HCC) 05/22/2022   Gait abnormality 05/22/2022   Candida infection 03/09/2022   Erythema 03/09/2022   DNR (do not resuscitate)/DNI(Do Not Intubate) 02/28/2022   Acute renal failure superimposed on stage 4 chronic kidney disease (HCC) 02/28/2022   Elevated LFTs 02/28/2022   Acute urinary retention 02/28/2022   Pleural effusion on right 02/28/2022   Hallucinations 02/27/2022   Anemia of chronic disease 02/21/2022   (HFpEF) heart failure with preserved ejection fraction (HCC) 02/21/2022   Thrombocytopenia (HCC) 02/21/2022   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   History of cervical discectomy 09/21/2021   Polyarthralgia    Chronic gouty arthritis    Bilateral plantar fasciitis 10/25/2020   Weakness of both lower extremities 04/09/2020   S/P inguinal hernia repair 08/13/2019   Left inguinal hernia 07/01/2019   Lumbar post-laminectomy syndrome 12/12/2017   Spinal stenosis of lumbar region 10/09/2017   Frequent urination 09/03/2017   Gouty arthritis of right great toe 09/03/2017   Anasarca 06/29/2017   Trochanteric bursitis of hip, bilateral 01/10/2017   Internal hemorrhoids 08/15/2016   Cephalalgia 07/14/2015   Cough 02/01/2015   Postoperative atrial fibrillation 05/03/2012   Pacemaker 04/10/2012   AV block, 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 03/26/2012   Presence of aortocoronary bypass graft 10/24/2011   Pulmonary nodule    CAD (coronary artery disease)    CKD (chronic kidney  disease) stage 4, GFR 15-29 ml/min (HCC)    Venous insufficiency of leg 06/05/2010   Insomnia 11/09/2009   Pain in joint, shoulder region 10/14/2009   Vitamin B12 deficiency 08/23/2009   Peripheral neuropathy 03/21/2009   Degenerative disc disease, cervical, with radiculopathy 09/21/2008   Vertigo 08/29/2007   Dyslipidemia 01/17/2007   Essential hypertension 07/20/2006   Prostate cancer 07/20/2006    ONSET DATE: 08/13/2022 (MD referral)  REFERRING DIAG:  G62.89 (ICD-10-CM) - Other polyneuropathy  R42 (ICD-10-CM) - Dizziness  R53.1 (ICD-10-CM) - Generalized weakness    THERAPY DIAG:  Unsteadiness on feet  Other abnormalities of gait and mobility  Muscle weakness (generalized)  Rationale for Evaluation and Treatment: Rehabilitation  SUBJECTIVE:  SUBJECTIVE STATEMENT: "They say I need to come back for my balance."  Have electricity running down my legs when I walk.  Not anything new.  Recently finished cardiac rehab.  Have not had any bouts of vertigo in quite a while. Pt accompanied by: self and significant other-wife (stays outside)  PERTINENT HISTORY: chronic low BP, low stamina, reports of dizziness-see complete PMH above; hx of valve replacement in January, per pt report; hx of vertigo  PAIN:  Are you having pain? Yes: NPRS scale: 7-8 (varies-sometimes down to 1-2)/10 Pain location: legs Pain description: numbness, pain Aggravating factors: unsure Relieving factors: unsure , Aspercreme  PRECAUTIONS: Fall decreased vision due to macular degeneration  RED FLAGS: None   WEIGHT BEARING RESTRICTIONS: No  FALLS: Has patient fallen in last 6 months? No  LIVING ENVIRONMENT: Lives with: lives with their spouse Lives in: House/apartment Stairs: 2 steps to enter home Has following  equipment at home: Single point cane  PLOF: Independent with household mobility with device and Independent with community mobility with device.  Goes (occasionally) to [employer] at [employer] and works on some [employer] and stretches  PATIENT GOALS: Goals for therapy would be to help with pain/balance  OBJECTIVE:   DIAGNOSTIC FINDINGS: NA for this episode  COGNITION: Overall cognitive status: Within functional limits for tasks assessed   SENSATION: Light touch: WFL and reports electric sensations at times   POSTURE: rounded shoulders and forward head  LOWER EXTREMITY ROM:   Active ROM WFL for BLEs in sitting, except some tightness noted in bilat hamstrings   LOWER EXTREMITY MMT:    MMT Right Eval Left Eval  Hip flexion 4+ 4+  Hip extension    Hip abduction    Hip adduction    Hip internal rotation    Hip external rotation    Knee flexion 4+ 4+  Knee extension 4+ 4+  Ankle dorsiflexion 4 4  Ankle plantarflexion    Ankle inversion    Ankle eversion    (Blank rows = not tested)  TRANSFERS: Assistive device utilized:  BUE support   Sit to stand: Modified independence and SBA Stand to sit: Modified independence and SBA  GAIT: Gait pattern:  decreased push off with gait, step through pattern, decreased stride length, and narrow BOS Distance walked: 200 ft Assistive device utilized: Single point cane, pt tends to carry cane for short distances, and None Level of assistance: SBA   FUNCTIONAL TESTS:  5 times sit to stand: 13.28 with BUE support; unable to perform without UE support Timed up and go (TUG): 16.65 sec (no device) 2 minute walk test: 190 ft (O2 94%, HR 78 bpm); stops x 2 10 meter walk test: 13.6 sec = 2.41 ft/sec Berg Balance Scale: 34/56 (Scores <45/56 indicate increased fall risk)   TODAY'S TREATMENT:  DATE: 08/29/2022     PATIENT EDUCATION: Education details: PT eval results, POC Person educated: Patient Education method: Explanation Education comprehension: verbalized understanding  HOME EXERCISE PROGRAM: Not yet initiated  GOALS: Goals reviewed with patient? Yes  SHORT TERM GOALS: Target date: 09/28/2022  Pt will be independent with HEP for improved balance, strength. Baseline: Goal status: INITIAL  2.  Pt will improve 5x sit<>stand to less than or equal to 15 sec without UE support to demonstrate improved functional strength and transfer efficiency. Baseline: 13.28 sec with BUE support Goal status: INITIAL  3.  Pt will improve TUG score to less than or equal to 13.5 sec for decreased fall risk. Baseline: 16.65 sec Goal status: INITIAL   LONG TERM GOALS: Target date: 10/26/2022  Pt will be independent with HEP for improved balance, strength, gait. Baseline:  Goal status: INITIAL  2.  Pt will improve Berg score to at least 42/56 to decrease fall risk. Baseline: 34/56 Goal status: INITIAL  3.  Pt will ambulate at least 250 ft in 2 MWT for improved gait efficiency and endurance. Baseline: 180 ft Goal status: INITIAL  4.  Pt will verbalize plans for continued community fitness upon d/c from PT to maximize gains made in PT. Baseline:  Goal status: INITIAL   ASSESSMENT:  CLINICAL IMPRESSION: Patient is a 89 y.o. male who was seen today for physical therapy evaluation and treatment for weakness, dizziness.  He denies vertigo type dizziness, as he has had in the past.  He has history of polyneuropathy, decreased vision due to macular degeneration.  He presents today with increased risk of falls on TUG, Berg measures, decreased functional strength, decreased gait velocity, and decreased endurance for gait.  He will benefit from skilled PT to address the above stated deficits to improve functional mobility and to decrease fall risk.  He is interested in returning to gym activities at the  Club, where he has done aerobic and strengthening activities in the past.  OBJECTIVE IMPAIRMENTS: Abnormal gait, decreased balance, decreased endurance, decreased mobility, difficulty walking, and decreased strength.   ACTIVITY LIMITATIONS: standing, transfers, and locomotion level  PARTICIPATION LIMITATIONS: shopping, community activity, and return to gym  PERSONAL FACTORS: 3+ comorbidities: PMH-see above  are also affecting patient's functional outcome.   REHAB POTENTIAL: Good  CLINICAL DECISION MAKING: Evolving/moderate complexity  EVALUATION COMPLEXITY: Moderate  PLAN:  PT FREQUENCY:  2x/wk for 4 weeks, then 1x/wk for 4 weeks  PT DURATION: 8 weeks  PLANNED INTERVENTIONS: Therapeutic exercises, Therapeutic activity, Neuromuscular re-education, Balance training, Gait training, Patient/Family education, Self Care, Vestibular training, and DME instructions  PLAN FOR NEXT SESSION: Initiate HEP for strength, balance, continue/progress his walking at home    Lonia Blood, PT 08/29/22 5:43 PM Phone: [phone] Fax: [phone]  [facility] [address], [address] [place], [place] [address] Phone # [phone] Fax # [phone]

## 2022-09-01 NOTE — Therapy (Signed)
OUTPATIENT PHYSICAL THERAPY NEURO TREATMENT   Patient Name: [name] MRN: [id number] DOB:Dec 14, 1926, 89 y.o., male Today's Date: 09/03/2022   PCP: Pincus Sanes, MD  REFERRING PROVIDER: Pincus Sanes, MD   END OF SESSION:  PT End of Session - 09/03/22 1356     Visit Number 2    Number of Visits 13    Date for PT Re-Evaluation 10/26/22    Authorization Type Medicare/Tricare    Progress Note Due on Visit 10    PT Start Time 1309    PT Stop Time 1354    PT Time Calculation (min) 45 min    Equipment Utilized During Treatment Gait belt    Activity Tolerance Patient tolerated treatment well;Patient limited by fatigue    Behavior During Therapy [facility] for tasks assessed/performed              Past Medical History:  Diagnosis Date   Anemia    AV block, 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 S/P stenting to mid RCA, prox PDA 06/1999. b. NSTEMI/CABG x 3 in 10/2011 with LIMA to LAD, SVG to PDA, and SVG to OM1.    Cephalalgia 07/14/2015   CHB (complete heart block) 05/03/2012   Overview:  Status post complete heart block heart rate 28 bpm, alternating with 2 to one AV block and drug for bradycardia.  Status post pacemaker implantation.status post Medtronic pacemaker implantation the 03/28/2012   Chronic diastolic CHF (congestive heart failure) 02/05/2018   Chronic gouty arthritis    Chronic UTI    a. Followed by Dr. [name] Caprice - colonized/asymptomatic - not on abx   CKD (chronic kidney disease)    stage 3, GFR 30-59 ml/min; stable with a creatinine around 1.9-2.0 followed by nephrology.   Constipation 03/01/2015   Symptoms and exam consistent with constipation. Abdominal exam is benign with no evidence of pain or obstruction. Discussed importance of increasing fiber and water intake coupled with physical activity to assist with bowel movements. Continue over-the-counter medication  management as needed. Follow-up if symptoms worsen or fail to improve.   Coronary atherosclerosis 11/27/2011   Overview:  Multivessel coronary artery disease recently diagnosed by catheterization 2013 History of stent placement with a heparin-coated stent 2001  Last Assessment & Plan:  Status post coronary bypass grafting.  No recurrent chest pain. Overview:  Overview:  S/P stenting to mid RCA, prox PDA 06/1999;  06/2007 Myoview: negative except for apical thinning, EF 68%, last Myoview August 10, 2008 with n   Degeneration of lumbar intervertebral disc    Degenerative disc disease, cervical, with radiculopathy 09/21/2008   Diastolic dysfunction 07/04/2017   Grade 1 DD on Echo 06/2017   Dyslipidemia    Dysphagia 02/06/2016   3/18 - DG Esophagus:  1. Mild esophageal dysmotility, likely presbyesophagus. 2. No other explanation for patient's symptoms. 3. Small hiatal hernia.  On swallow eval - evidence of cervical spine disease and that was likely contributing   Epistaxis 12/08/2020   Essential hypertension 07/20/2006   GERD (gastroesophageal reflux disease)    Gout 05/12/2018   Gouty arthritis of right great toe 09/03/2017   Left toe Injected January 31, 2018    Hyperpigmentation 11/04/2017   Internal hemorrhoids 08/15/2016   Left inguinal hernia 07/01/2019   Lumbar post-laminectomy syndrome 12/12/2017   Moderate aortic regurgitation 07/04/2017   Echo 06/2017:  EF 55-60%, mild LVH, grade 1 DD, mild AS,  mod AR, mild MR, mild-mod TR   Neck injury    a. 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  This was switched to by mouth amiodarone at the time of the thoracic surgery appointment was discontinued.   Presence of aortocoronary bypass graft 10/24/2011   Overview:  Performed at [facility] 2013 Last Assessment & Plan:  No complication post coronary bypass grafting.   Prostate cancer (HCC)    a. 2001 s/p TURP.   Pulmonary nodule    a. felt to be noncancerous.  Status post followup CT scan 4 mm and stable.   Renal artery stenosis    a. 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 Excell Seltzer & Dr. 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   Trochanteric bursitis of hip, bilateral 01/10/2017   UGIB (upper gastrointestinal bleed) 02/01/2018   EGD  02/06/18 - mod, non-erosive gastritis   Venous insufficiency of leg 06/05/2010   Vitamin B12 deficiency 08/23/2009   Jan '14  July '14 B12 level  >1500    472   Weakness of both lower extremities 04/09/2020   Past Surgical History:  Procedure Laterality Date   ANTERIOR CHAMBER WASHOUT Left 10/04/2018   Procedure: Anterior Chamber Washout, Vitreous Tap;  Surgeon: Carmela Rima, MD;  Location: [facility] OR;  Service: Ophthalmology;  Laterality: Left;   cardia catherization  07/07/1999   CARDIAC CATHETERIZATION     CARDIAC SURGERY  10/18/2012   open heart surgery   CATARACT EXTRACTION W/ INTRAOCULAR LENS  IMPLANT, BILATERAL  3/205, 06/2013   mccuen   COLONOSCOPY  04/12/2007   CORONARY ARTERY BYPASS GRAFT  10/19/2011   Procedure: CORONARY ARTERY BYPASS GRAFTING (CABG);  Surgeon: Alleen Borne, MD;  Location: [facility] OR;  Service: Open Heart Surgery;  Laterality: N/A;  times three using Left Internal Mammary Artery and Right Greater Saphenouse Vein Graft harvested Endoscopically    edg  07/17/1994   FLEXIBLE SIGMOIDOSCOPY  11/03/1997   GAS INSERTION Left 10/04/2018   Procedure: Insertion Of Gas;  Surgeon: Carmela Rima, MD;  Location: [facility] OR;  Service: Ophthalmology;  Laterality: Left;   GAS/FLUID EXCHANGE Left 10/04/2018   Procedure: Gas/Fluid Exchange;  Surgeon: Carmela Rima, MD;  Location[facility] OR;  Service: Ophthalmology;  Laterality: Left;   INTRAOPERATIVE TRANSTHORACIC ECHOCARDIOGRAM N/A 02/06/2022   Procedure: INTRAOPERATIVE TRANSTHORACIC ECHOCARDIOGRAM;  Surgeon: Tonny Bollman, MD;  Location: [facility] INVASIVE CV LAB;  Service: Open Heart Surgery;  Laterality: N/A;   LEFT HEART CATHETERIZATION WITH CORONARY ANGIOGRAM N/A 10/16/2011   Procedure: LEFT HEART CATHETERIZATION WITH CORONARY ANGIOGRAM;  Surgeon: Peter M Swaziland, MD;  Location: [facility] CATH LAB;  Service: Cardiovascular;  Laterality: N/A;   LEFT HEART CATHETERIZATION WITH CORONARY/GRAFT ANGIOGRAM N/A 03/11/2013   Procedure: LEFT HEART CATHETERIZATION WITH [name] Caprice;  Surgeon: Micheline Chapman, MD;  Location: [facility] CATH LAB;  Service:  Cardiovascular;  Laterality: N/A;   lumbar spinal disk and neck fusion surgery     PACEMAKER INSERTION  03/28/2012   PPM implanted for mobitz II AV block   PARS PLANA VITRECTOMY Left 10/04/2018   Procedure: PARS PLANA VITRECTOMY 25 GAUGE FOR ENDOPHTHALMITIS WITH INJECTION OF INTRAVITREAL ANTIBIOTIC;  Surgeon: Carmela Rima, MD;  Location: [facility] OR;  Service: Ophthalmology;  Laterality: Left;   peripheral vascular catherization  11/24/2003   PERMANENT PACEMAKER INSERTION N/A 03/28/2012   Procedure: PERMANENT PACEMAKER INSERTION;  Surgeon: Hillis Range, MD;  Location: [facility] CATH LAB;  Service: Cardiovascular;  Laterality: N/A;   PROSTATECTOMY     renal circulation  10/01/2003   RIGHT HEART CATH N/A 03/02/2022   Procedure: RIGHT HEART CATH;  Surgeon: Runell Gess, MD;  Location: [facility] INVASIVE CV LAB;  Service: Cardiovascular;  Laterality: N/A;   s/p ptca     stents      X 2   stress cardiolite  05/04/2005   spring 09-negative except for apical thinning, EF 68%   TRANSCATHETER AORTIC VALVE REPLACEMENT, TRANSFEMORAL Right 02/06/2022   Procedure: Transcatheter Aortic Valve Replacement, Transfemoral;  Surgeon: Tonny Bollman, MD;  Location: [facility] INVASIVE CV LAB;  Service: Open Heart Surgery;  Laterality: Right;   Patient Active Problem List   Diagnosis Date Noted   Dizziness 08/13/2022   Generalized weakness 08/13/2022   Community acquired pneumonia 06/07/2022   Dementia with psychotic disturbance (HCC) 05/22/2022   Gait abnormality 05/22/2022   Candida infection 03/09/2022   Erythema 03/09/2022   DNR (do not resuscitate)/DNI(Do Not Intubate) 02/28/2022   Acute renal failure superimposed on stage 4 chronic kidney disease (HCC) 02/28/2022   Elevated LFTs 02/28/2022   Acute urinary retention 02/28/2022   Pleural effusion on right 02/28/2022   Hallucinations 02/27/2022   Anemia of chronic disease 02/21/2022   (HFpEF) heart failure with preserved ejection fraction (HCC) 02/21/2022   Thrombocytopenia (HCC) 02/21/2022   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   History of cervical discectomy 09/21/2021   Polyarthralgia    Chronic gouty arthritis    Bilateral plantar fasciitis 10/25/2020   Weakness of both lower extremities 04/09/2020   S/P inguinal hernia repair 08/13/2019   Left inguinal hernia 07/01/2019   Lumbar post-laminectomy syndrome 12/12/2017   Spinal stenosis of lumbar region 10/09/2017   Frequent urination 09/03/2017   Gouty arthritis of right great toe 09/03/2017   Anasarca 06/29/2017   Trochanteric bursitis of hip, bilateral 01/10/2017   Internal hemorrhoids 08/15/2016   Cephalalgia 07/14/2015   Cough 02/01/2015   Postoperative atrial fibrillation 05/03/2012   Pacemaker 04/10/2012   AV block, 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 03/26/2012   Presence of aortocoronary bypass graft 10/24/2011   Pulmonary nodule     CAD (coronary artery disease)    CKD (chronic kidney disease) stage 4, GFR 15-29 ml/min (HCC)    Venous insufficiency of leg 06/05/2010   Insomnia 11/09/2009   Pain in joint, shoulder region 10/14/2009   Vitamin B12 deficiency 08/23/2009   Peripheral neuropathy 03/21/2009   Degenerative disc disease, cervical, with radiculopathy 09/21/2008   Vertigo 08/29/2007   Dyslipidemia 01/17/2007   Essential hypertension 07/20/2006   Prostate cancer 07/20/2006    ONSET DATE: 08/13/2022 (MD referral)  REFERRING DIAG:  G62.89 (ICD-10-CM) - Other polyneuropathy  R42 (ICD-10-CM) - Dizziness  R53.1 (ICD-10-CM) - Generalized weakness    THERAPY DIAG:  Unsteadiness on feet  Other abnormalities of gait and mobility  Muscle weakness (generalized)  Rationale for Evaluation and  Treatment: Rehabilitation  SUBJECTIVE:                                                                                                                                                                                             SUBJECTIVE STATEMENT: "I'm doing fairly okay." Had an episode of LEs shaking at night when going to the restroom. This has happened before.   Pt accompanied by: self  PERTINENT HISTORY: chronic low BP, low stamina, reports of dizziness-see complete PMH above; hx of valve replacement in January, per pt report; hx of vertigo  PAIN:  Are you having pain? Yes: NPRS scale: 2-2.5/10 Pain location: L groin Pain description: "somebody jabbing me with a pointed instrument" Aggravating factors: unsure Relieving factors: unsure  PRECAUTIONS: Fall decreased vision due to macular degeneration  RED FLAGS: None   WEIGHT BEARING RESTRICTIONS: No  FALLS: Has patient fallen in last 6 months? No  LIVING ENVIRONMENT: Lives with: lives with their spouse Lives in: House/apartment Stairs: 2 steps to enter home Has following equipment at home: Single point cane  PLOF: Independent with household mobility  with device and Independent with community mobility with device.  Goes (occasionally) to [employer] and works on some [employer] and stretches  PATIENT GOALS: Goals for therapy would be to help with pain/balance  OBJECTIVE:     TODAY'S TREATMENT: 09/03/22 Activity Comments  Nustep L5 x 6 min UEs/LEs  Maintaining ~80 SPM  STS + green medball 2x5 Rest break after 4th rep d/t fatigue; use of hands against knees; cues to scoot forward   Step ups 10x each side on 6" step Weaned from 2 to 1 UE support; more difficulty on L LE  Standing 3 way hip #2 10x each  At TM rail ; cues for tall posture and straight knee. Pt reports N/T in B calf during sitting rest break.   tandem walk With cane and CGA; trialed backwards which was discontinued d/t more significant difficulty   backwards walk Cues to elongate L step   sidestepping over hurdle Weaned from 2 to 1 to no UE support; cues for longer L step     HOME EXERCISE PROGRAM Last updated: 09/03/22 Access Code: [email or web address] URL: [email or web address] Date: 09/03/2022 Prepared by: [facility] - Outpatient  Rehab - [facility]  Exercises - Sit to Stand  - 1 x daily - 5 x weekly - 2 sets - 10 reps - Forward Step Up  - 1 x daily - 5 x weekly - 2 sets - 10 reps - Backward Walking with Counter Support  - 1 x daily - 5 x weekly -  2 sets - 10 reps  PATIENT EDUCATION: Education details: HEP to be performed at counter or with handrail for safety  Person educated: Patient Education method: Explanation, Demonstration, Tactile cues, Verbal cues, and Handouts Education comprehension: verbalized understanding and returned demonstration    Below measures were taken at time of initial evaluation unless otherwise specified:   DIAGNOSTIC FINDINGS: NA for this episode  COGNITION: Overall cognitive status: Within functional limits for tasks assessed   SENSATION: Light touch: WFL and reports electric sensations at  times   POSTURE: rounded shoulders and forward head  LOWER EXTREMITY ROM:   Active ROM WFL for BLEs in sitting, except some tightness noted in bilat hamstrings   LOWER EXTREMITY MMT:    MMT Right Eval Left Eval  Hip flexion 4+ 4+  Hip extension    Hip abduction    Hip adduction    Hip internal rotation    Hip external rotation    Knee flexion 4+ 4+  Knee extension 4+ 4+  Ankle dorsiflexion 4 4  Ankle plantarflexion    Ankle inversion    Ankle eversion    (Blank rows = not tested)  TRANSFERS: Assistive device utilized:  BUE support   Sit to stand: Modified independence and SBA Stand to sit: Modified independence and SBA  GAIT: Gait pattern:  decreased push off with gait, step through pattern, decreased stride length, and narrow BOS Distance walked: 200 ft Assistive device utilized: Single point cane, pt tends to carry cane for short distances, and None Level of assistance: SBA   FUNCTIONAL TESTS:  5 times sit to stand: 13.28 with BUE support; unable to perform without UE support Timed up and go (TUG): 16.65 sec (no device) 2 minute walk test: 190 ft (O2 94%, HR 78 bpm); stops x 2 10 meter walk test: 13.6 sec = 2.41 ft/sec Berg Balance Scale: 34/56 (Scores <45/56 indicate increased fall risk)   TODAY'S TREATMENT:                                                                                                                              DATE: 08/29/2022    PATIENT EDUCATION: Education details: PT eval results, POC Person educated: Patient Education method: Explanation Education comprehension: verbalized understanding  HOME EXERCISE PROGRAM: Not yet initiated  GOALS: Goals reviewed with patient? Yes  SHORT TERM GOALS: Target date: 09/28/2022  Pt will be independent with HEP for improved balance, strength. Baseline: Goal status: IN PROGRESS  2.  Pt will improve 5x sit<>stand to less than or equal to 15 sec without UE support to demonstrate improved  functional strength and transfer efficiency. Baseline: 13.28 sec with BUE support Goal status: IN PROGRESS  3.  Pt will improve TUG score to less than or equal to 13.5 sec for decreased fall risk. Baseline: 16.65 sec Goal status:IN PROGRESS   LONG TERM GOALS: Target date: 10/26/2022  Pt will be independent with HEP for improved balance, strength, gait.  Baseline:  Goal status: IN PROGRESS  2.  Pt will improve Berg score to at least 42/56 to decrease fall risk. Baseline: 34/56 Goal status: IN PROGRESS  3.  Pt will ambulate at least 250 ft in 2 MWT for improved gait efficiency and endurance. Baseline: 180 ft Goal status: IN PROGRESS  4.  Pt will verbalize plans for continued community fitness upon d/c from PT to maximize gains made in PT. Baseline:  Goal status: IN PROGRESS   ASSESSMENT:  CLINICAL IMPRESSION: Patient arrived to session with report of some mild L groin pain today. Worked on LE strengthening activities with weighted resistance. Patient demonstrated muscle fatigue with weighted STS; not able to complete full set today. More obvious L LE weakness was evident with hip strengthening and step ups. Patient required short sitting rest breaks occasionally during session d/t fatigue. Patient tolerated session well and without complaints upon leaving.   OBJECTIVE IMPAIRMENTS: Abnormal gait, decreased balance, decreased endurance, decreased mobility, difficulty walking, and decreased strength.   ACTIVITY LIMITATIONS: standing, transfers, and locomotion level  PARTICIPATION LIMITATIONS: shopping, community activity, and return to gym  PERSONAL FACTORS: 3+ comorbidities: PMH-see above  are also affecting patient's functional outcome.   REHAB POTENTIAL: Good  CLINICAL DECISION MAKING: Evolving/moderate complexity  EVALUATION COMPLEXITY: Moderate  PLAN:  PT FREQUENCY:  2x/wk for 4 weeks, then 1x/wk for 4 weeks  PT DURATION: 8 weeks  PLANNED INTERVENTIONS: Therapeutic  exercises, Therapeutic activity, Neuromuscular re-education, Balance training, Gait training, Patient/Family education, Self Care, Vestibular training, and DME instructions  PLAN FOR NEXT SESSION: review and progress HEP for LE strengthening, continue/progress his walking at home  Anette Guarneri, PT, DPT 09/03/22 1:57 PM  [facility] [address], [address] [place], [place] [address] Phone # [phone] Fax # [phone]

## 2022-09-03 ENCOUNTER — Ambulatory Visit: Payer: Medicare Other | Admitting: Physical Therapy

## 2022-09-03 ENCOUNTER — Encounter: Payer: Self-pay | Admitting: Physical Therapy

## 2022-09-03 DIAGNOSIS — R2689 Other abnormalities of gait and mobility: Secondary | ICD-10-CM

## 2022-09-03 DIAGNOSIS — R2681 Unsteadiness on feet: Secondary | ICD-10-CM

## 2022-09-03 DIAGNOSIS — M6281 Muscle weakness (generalized): Secondary | ICD-10-CM

## 2022-09-04 ENCOUNTER — Ambulatory Visit: Payer: Medicare Other | Attending: Cardiovascular Disease | Admitting: Cardiovascular Disease

## 2022-09-04 ENCOUNTER — Encounter: Payer: Self-pay | Admitting: Cardiovascular Disease

## 2022-09-04 VITALS — BP 126/80 | HR 61 | Ht 67.25 in | Wt 148.6 lb

## 2022-09-04 DIAGNOSIS — I480 Paroxysmal atrial fibrillation: Secondary | ICD-10-CM | POA: Diagnosis present

## 2022-09-04 DIAGNOSIS — I5032 Chronic diastolic (congestive) heart failure: Secondary | ICD-10-CM | POA: Insufficient documentation

## 2022-09-04 DIAGNOSIS — I441 Atrioventricular block, second degree: Secondary | ICD-10-CM | POA: Diagnosis present

## 2022-09-04 DIAGNOSIS — I442 Atrioventricular block, complete: Secondary | ICD-10-CM | POA: Insufficient documentation

## 2022-09-04 LAB — CUP PACEART INCLINIC DEVICE CHECK
Battery Impedance: 1706 Ohm
Battery Remaining Longevity: 40 mo
Battery Voltage: 2.76 V
Brady Statistic AP VP Percent: 12 %
Brady Statistic AP VS Percent: 79 %
Brady Statistic AS VP Percent: 0 %
Brady Statistic AS VS Percent: 9 %
Date Time Interrogation Session: 20240827145626
Implantable Lead Connection Status: 753985
Implantable Lead Connection Status: 753985
Implantable Lead Implant Date: 20140321
Implantable Lead Implant Date: 20140321
Implantable Lead Location: 753859
Implantable Lead Location: 753860
Implantable Lead Model: 5076
Implantable Lead Model: 5092
Implantable Pulse Generator Implant Date: 20140321
Lead Channel Impedance Value: 406 Ohm
Lead Channel Impedance Value: 639 Ohm
Lead Channel Pacing Threshold Amplitude: 0.5 V
Lead Channel Pacing Threshold Amplitude: 0.625 V
Lead Channel Pacing Threshold Amplitude: 0.75 V
Lead Channel Pacing Threshold Amplitude: 1 V
Lead Channel Pacing Threshold Pulse Width: 0.4 ms
Lead Channel Pacing Threshold Pulse Width: 0.4 ms
Lead Channel Pacing Threshold Pulse Width: 0.4 ms
Lead Channel Pacing Threshold Pulse Width: 0.4 ms
Lead Channel Sensing Intrinsic Amplitude: 15.67 mV
Lead Channel Sensing Intrinsic Amplitude: 2.8 mV
Lead Channel Setting Pacing Amplitude: 2 V
Lead Channel Setting Pacing Amplitude: 2.5 V
Lead Channel Setting Pacing Pulse Width: 0.4 ms
Lead Channel Setting Sensing Sensitivity: 5.6 mV
Zone Setting Status: 755011
Zone Setting Status: 755011

## 2022-09-04 NOTE — Progress Notes (Signed)
b12 Injection given.   Mike J Burns, MD  

## 2022-09-04 NOTE — Patient Instructions (Signed)

## 2022-09-04 NOTE — Progress Notes (Signed)
  Electrophysiology Office Note:    Date:  09/04/2022   ID:  [name], DOB March 22, 1926, MRN [id number]  PCP:  Pincus Sanes, MD   [facility] Providers Cardiologist:  Tonny Bollman, MD Electrophysiologist:  Maurice Small, MD     Referring MD: Pincus Sanes, MD   History of Present Illness:    [name] is a 89 y.o. male with a hx listed below, significant for second-degree AV block, status postplacement of Medtronic pacemaker in 2014 who presents for device and arrhythmia follow-up.    He has a history of recurrent epistaxis, requiring ER visits in the past.  Since his last clinic visit, he has had two episodes of atrial fibrillation (2/16 and 3/2). These lasted between 3-4 hours. He was unaware of any heart rhythm abnormality. Reviewing prior interrogations shows that he has had atrial high rate episodes in the past -- these have been of short duration.     he has no device related complaints -- no new tenderness, drainage, redness.   EKGs/Labs/Other Studies Reviewed Today:    Echocardiogram:  TTE 03/14/2022 EF 60-65%, s/p TAVR 02/06/22; mild-moderate MR. Atria are normal in size.   Monitors:   Stress testing:    Advanced imaging:  CT coronary 01/11/2022 S/p CABG with LIMA-LAD; SVG-PDA and SVG-PM1 are occluded.  Cardiac catherization   EKG:  Last EKG results: today: A-V paced with prolonged AV delay, RBBB LAFB   Recent Labs: 02/27/2022: B Natriuretic Peptide 490.4 02/28/2022: Magnesium 2.4 04/03/2022: TSH 1.940 06/02/2022: ALT 7; Platelets 260 08/15/2022: BUN 63; Creatinine, Ser 2.66; Hemoglobin 10.5; Potassium 4.5; Sodium 136     Physical Exam:    VS:  BP 126/80 (BP Location: Right Arm, Patient Position: Sitting, Cuff Size: Normal)   Pulse 61   Ht 5' 7.25" (1.708 m)   Wt 148 lb 9.6 oz (67.4 kg)   SpO2 97%   BMI 23.10 kg/m     Wt Readings from Last 3 Encounters:  09/04/22 148 lb 9.6 oz (67.4 kg)  08/13/22 148 lb (67.1  kg)  07/23/22 155 lb 10.3 oz (70.6 kg)     GEN: Well nourished, well developed in no acute distress CARDIAC: RRR, no murmurs, rubs, gallops RESPIRATORY:  Normal work of breathing MUSCULOSKELETAL: no edema    ASSESSMENT & PLAN:    History of second degree AV block Medtronic dual chamber in place, functioning normally Prolonged AV delay -- MVP on. Low V-pacing burden I reviewed the interrogation in detail today. See PaceArt   Atrial fibrillation He has had just two episodes of prolonged AF; overall burden remains < 0.1% With history of severe epistaxis, risk of bleeding outweighs benefit No AF detected since prior interrogation   CAD s/p CABG Continue ASA 81, carvedilol 3.125  S/p TAVR           Medication Adjustments/Labs and Tests Ordered: Current medicines are reviewed at length with the patient today.  Concerns regarding medicines are outlined above.  Orders Placed This Encounter  Procedures   EKG 12-Lead   No orders of the defined types were placed in this encounter.    Signed, Maurice Small, MD  09/04/2022 2:30 PM    [facility]

## 2022-09-05 ENCOUNTER — Ambulatory Visit: Payer: Medicare Other | Admitting: Physical Therapy

## 2022-09-05 ENCOUNTER — Encounter: Payer: Self-pay | Admitting: Physical Therapy

## 2022-09-05 DIAGNOSIS — R2681 Unsteadiness on feet: Secondary | ICD-10-CM

## 2022-09-05 DIAGNOSIS — R2689 Other abnormalities of gait and mobility: Secondary | ICD-10-CM

## 2022-09-05 DIAGNOSIS — M6281 Muscle weakness (generalized): Secondary | ICD-10-CM

## 2022-09-05 NOTE — Therapy (Signed)
OUTPATIENT PHYSICAL THERAPY NEURO TREATMENT   Patient Name: [name] MRN: [id number] DOB:12-27-1926, 89 y.o., male Today's Date: 09/05/2022   PCP: Pincus Sanes, MD  REFERRING PROVIDER: Pincus Sanes, MD   END OF SESSION:  PT End of Session - 09/05/22 1449     Visit Number 3    Number of Visits 13    Date for PT Re-Evaluation 10/26/22    Authorization Type Medicare/Tricare    Progress Note Due on Visit 10    PT Start Time 1449    PT Stop Time 1528    PT Time Calculation (min) 39 min    Equipment Utilized During Treatment Gait belt    Activity Tolerance Patient tolerated treatment well;Patient limited by fatigue    Behavior During Therapy [facility] for tasks assessed/performed               Past Medical History:  Diagnosis Date   Anemia    AV block, 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 S/P stenting to mid RCA, prox PDA 06/1999. b. NSTEMI/CABG x 3 in 10/2011 with LIMA to LAD, SVG to PDA, and SVG to OM1.    Cephalalgia 07/14/2015   CHB (complete heart block) 05/03/2012   Overview:  Status post complete heart block heart rate 28 bpm, alternating with 2 to one AV block and drug for bradycardia.  Status post pacemaker implantation.status post Medtronic pacemaker implantation the 03/28/2012   Chronic diastolic CHF (congestive heart failure) 02/05/2018   Chronic gouty arthritis    Chronic UTI    a. Followed by Dr. [name] Caprice - colonized/asymptomatic - not on abx   CKD (chronic kidney disease)    stage 3, GFR 30-59 ml/min; stable with a creatinine around 1.9-2.0 followed by nephrology.   Constipation 03/01/2015   Symptoms and exam consistent with constipation. Abdominal exam is benign with no evidence of pain or obstruction. Discussed importance of increasing fiber and water intake coupled with physical activity to assist with bowel movements. Continue over-the-counter medication  management as needed. Follow-up if symptoms worsen or fail to improve.   Coronary atherosclerosis 11/27/2011   Overview:  Multivessel coronary artery disease recently diagnosed by catheterization 2013 History of stent placement with a heparin-coated stent 2001  Last Assessment & Plan:  Status post coronary bypass grafting.  No recurrent chest pain. Overview:  Overview:  S/P stenting to mid RCA, prox PDA 06/1999;  06/2007 Myoview: negative except for apical thinning, EF 68%, last Myoview August 10, 2008 with n   Degeneration of lumbar intervertebral disc    Degenerative disc disease, cervical, with radiculopathy 09/21/2008   Diastolic dysfunction 07/04/2017   Grade 1 DD on Echo 06/2017   Dyslipidemia    Dysphagia 02/06/2016   3/18 - DG Esophagus:  1. Mild esophageal dysmotility, likely presbyesophagus. 2. No other explanation for patient's symptoms. 3. Small hiatal hernia.  On swallow eval - evidence of cervical spine disease and that was likely contributing   Epistaxis 12/08/2020   Essential hypertension 07/20/2006   GERD (gastroesophageal reflux disease)    Gout 05/12/2018   Gouty arthritis of right great toe 09/03/2017   Left toe Injected January 31, 2018    Hyperpigmentation 11/04/2017   Internal hemorrhoids 08/15/2016   Left inguinal hernia 07/01/2019   Lumbar post-laminectomy syndrome 12/12/2017   Moderate aortic regurgitation 07/04/2017   Echo 06/2017:  EF 55-60%, mild LVH, grade 1 DD, mild  AS, mod AR, mild MR, mild-mod TR   Neck injury    a. 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  This was switched to by mouth amiodarone at the time of the thoracic surgery appointment was discontinued.   Presence of aortocoronary bypass graft 10/24/2011   Overview:  Performed at [facility] 2013 Last Assessment & Plan:  No complication post coronary bypass grafting.   Prostate cancer (HCC)    a. 2001 s/p TURP.   Pulmonary nodule    a. felt to be noncancerous.  Status post followup CT scan 4 mm and stable.   Renal artery stenosis    a. 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 Excell Seltzer & Dr. 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   Trochanteric bursitis of hip, bilateral 01/10/2017   UGIB (upper gastrointestinal bleed) 02/01/2018   EGD  02/06/18 - mod, non-erosive gastritis   Venous insufficiency of leg 06/05/2010   Vitamin B12 deficiency 08/23/2009   Jan '14  July '14 B12 level  >1500    472   Weakness of both lower extremities 04/09/2020   Past Surgical History:  Procedure Laterality Date   ANTERIOR CHAMBER WASHOUT Left 10/04/2018   Procedure: Anterior Chamber Washout, Vitreous Tap;  Surgeon: Carmela Rima, MD;  Location: [facility] OR;  Service: Ophthalmology;  Laterality: Left;   cardia catherization  07/07/1999   CARDIAC CATHETERIZATION     CARDIAC SURGERY  10/18/2012   open heart surgery   CATARACT EXTRACTION W/ INTRAOCULAR LENS  IMPLANT, BILATERAL  3/205, 06/2013   mccuen   COLONOSCOPY  04/12/2007   CORONARY ARTERY BYPASS GRAFT  10/19/2011   Procedure: CORONARY ARTERY BYPASS GRAFTING (CABG);  Surgeon: Alleen Borne, MD;  Location: [facility] OR;  Service: Open Heart Surgery;  Laterality: N/A;  times three using Left Internal Mammary Artery and Right Greater Saphenouse Vein Graft harvested Endoscopically    edg  07/17/1994   FLEXIBLE SIGMOIDOSCOPY  11/03/1997   GAS INSERTION Left 10/04/2018   Procedure: Insertion Of Gas;  Surgeon: Carmela Rima, MD;  Location: [facility] OR;  Service: Ophthalmology;  Laterality: Left;   GAS/FLUID EXCHANGE Left 10/04/2018   Procedure: Gas/Fluid Exchange;  Surgeon: Carmela Rima, MD;  Location: [facility] OR;  Service: Ophthalmology;  Laterality: Left;   INTRAOPERATIVE TRANSTHORACIC ECHOCARDIOGRAM N/A 02/06/2022   Procedure: INTRAOPERATIVE TRANSTHORACIC ECHOCARDIOGRAM;  Surgeon: Tonny Bollman, MD;  Location: [facility] INVASIVE CV LAB;  Service: Open Heart Surgery;  Laterality: N/A;   LEFT HEART CATHETERIZATION WITH CORONARY ANGIOGRAM N/A 10/16/2011   Procedure: LEFT HEART CATHETERIZATION WITH CORONARY ANGIOGRAM;  Surgeon: Peter M Swaziland, MD;  Location: [facility] CATH LAB;  Service: Cardiovascular;  Laterality: N/A;   LEFT HEART CATHETERIZATION WITH CORONARY/GRAFT ANGIOGRAM N/A 03/11/2013   Procedure: LEFT HEART CATHETERIZATION WITH [name] Caprice;  Surgeon: Micheline Chapman, MD;  Location: [facility] CATH LAB;  Service: Cardiovascular;  Laterality: N/A;   lumbar spinal disk and neck fusion surgery     PACEMAKER INSERTION  03/28/2012   PPM implanted for mobitz II AV block   PARS PLANA VITRECTOMY Left 10/04/2018   Procedure: PARS PLANA VITRECTOMY 25 GAUGE FOR ENDOPHTHALMITIS WITH INJECTION OF INTRAVITREAL ANTIBIOTIC;  Surgeon: Carmela Rima, MD;  Location: [facility] OR;  Service: Ophthalmology;  Laterality: Left;   peripheral vascular catherization  11/24/2003   PERMANENT PACEMAKER INSERTION N/A 03/28/2012   Procedure: PERMANENT PACEMAKER INSERTION;  Surgeon: Hillis Range, MD;  Location: [facility] CATH LAB;  Service: Cardiovascular;  Laterality: N/A;   PROSTATECTOMY     renal circulation  10/01/2003   RIGHT HEART CATH N/A 03/02/2022   Procedure: RIGHT HEART CATH;  Surgeon: Runell Gess, MD;  Location: [facility] INVASIVE CV LAB;  Service: Cardiovascular;  Laterality: N/A;   s/p ptca     stents      X 2   stress cardiolite  05/04/2005   spring 09-negative except for apical thinning, EF 68%   TRANSCATHETER AORTIC VALVE REPLACEMENT, TRANSFEMORAL Right 02/06/2022   Procedure: Transcatheter Aortic Valve Replacement, Transfemoral;  Surgeon: Tonny Bollman, MD;  Location: [facility] INVASIVE CV LAB;  Service: Open Heart Surgery;  Laterality: Right;   Patient Active Problem List   Diagnosis Date Noted   Dizziness 08/13/2022   Generalized weakness 08/13/2022   Community acquired pneumonia 06/07/2022   Dementia with psychotic disturbance (HCC) 05/22/2022   Gait abnormality 05/22/2022   Candida infection 03/09/2022   Erythema 03/09/2022   DNR (do not resuscitate)/DNI(Do Not Intubate) 02/28/2022   Acute renal failure superimposed on stage 4 chronic kidney disease (HCC) 02/28/2022   Elevated LFTs 02/28/2022   Acute urinary retention 02/28/2022   Pleural effusion on right 02/28/2022   Hallucinations 02/27/2022   Anemia of chronic disease 02/21/2022   (HFpEF) heart failure with preserved ejection fraction (HCC) 02/21/2022   Thrombocytopenia (HCC) 02/21/2022   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   History of cervical discectomy 09/21/2021   Polyarthralgia    Chronic gouty arthritis    Bilateral plantar fasciitis 10/25/2020   Weakness of both lower extremities 04/09/2020   S/P inguinal hernia repair 08/13/2019   Left inguinal hernia 07/01/2019   Lumbar post-laminectomy syndrome 12/12/2017   Spinal stenosis of lumbar region 10/09/2017   Frequent urination 09/03/2017   Gouty arthritis of right great toe 09/03/2017   Anasarca 06/29/2017   Trochanteric bursitis of hip, bilateral 01/10/2017   Internal hemorrhoids 08/15/2016   Cephalalgia 07/14/2015   Cough 02/01/2015   Postoperative atrial fibrillation 05/03/2012   Pacemaker 04/10/2012   AV block, 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 03/26/2012   Presence of aortocoronary bypass graft 10/24/2011   Pulmonary nodule     CAD (coronary artery disease)    CKD (chronic kidney disease) stage 4, GFR 15-29 ml/min (HCC)    Venous insufficiency of leg 06/05/2010   Insomnia 11/09/2009   Pain in joint, shoulder region 10/14/2009   Vitamin B12 deficiency 08/23/2009   Peripheral neuropathy 03/21/2009   Degenerative disc disease, cervical, with radiculopathy 09/21/2008   Vertigo 08/29/2007   Dyslipidemia 01/17/2007   Essential hypertension 07/20/2006   Prostate cancer 07/20/2006    ONSET DATE: 08/13/2022 (MD referral)  REFERRING DIAG:  G62.89 (ICD-10-CM) - Other polyneuropathy  R42 (ICD-10-CM) - Dizziness  R53.1 (ICD-10-CM) - Generalized weakness    THERAPY DIAG:  Unsteadiness on feet  Other abnormalities of gait and mobility  Muscle weakness (generalized)  Rationale for Evaluation  and Treatment: Rehabilitation  SUBJECTIVE:                                                                                                                                                                                             SUBJECTIVE STATEMENT: I did the exercises from last time and it wore me out.  No falls.  Almost anything wears me out.  Pt accompanied by: self  PERTINENT HISTORY: chronic low BP, low stamina, reports of dizziness-see complete PMH above; hx of valve replacement in January, per pt report; hx of vertigo  PAIN:  Are you having pain? Yes: NPRS scale: 6/10 Pain location: legs Pain description: tightness Aggravating factors: unsure Relieving factors: unsure  PRECAUTIONS: Fall decreased vision due to macular degeneration  RED FLAGS: None   WEIGHT BEARING RESTRICTIONS: No  FALLS: Has patient fallen in last 6 months? No  LIVING ENVIRONMENT: Lives with: lives with their spouse Lives in: House/apartment Stairs: 2 steps to enter home Has following equipment at home: Single point cane  PLOF: Independent with household mobility with device and Independent with community mobility with device.   Goes (occasionally) to [employer] at [employer] and works on some [employer] and stretches  PATIENT GOALS: Goals for therapy would be to help with pain/balance  OBJECTIVE:    TODAY'S TREATMENT: 09/05/2022 Activity Comments  NuStep, Level 3, 6 minutes; O2 sats 98%, HR 102 For warm up for flexibility and aerobic exercise, SPM 80-90  Seated hamstring stretch, 3 x 30 sec Good stretch  Seated gastroc stretch with strap, 2 x 30" Good stretch  Standing runner's stretch for gastrocs, 2 x 30" Good form  Alt step taps to 6" step, 2 x 10 reps BUE>1 UE support  Sidestep/together at counter, 12 reps R and L BUE support  Forward step ups to 6" step, BUE support Cues to slow pace slightly   Access Code: [email or web address] URL: [email or web address] Date: 09/05/2022 Prepared by: [facility] - Outpatient  Rehab - [facility]  Exercises - Sit to Stand  - 1 x daily - 5 x weekly - 2 sets - 10 reps - Forward Step Up  - 1 x daily - 5 x weekly - 2 sets - 10 reps - Backward Walking with Counter Support  - 1 x daily - 5 x weekly - 2 sets - 10 reps - Seated Hamstring Stretch  - 1-2 x daily - 7 x weekly - 1 sets - 3 reps - 30 sec hold - Seated Gastroc Stretch with Strap  - 1-2 x daily - 7 x weekly - 1 sets - 3 reps - 30 sec hold -  Standing Gastroc Stretch at [employer]  - 1-2 x daily - 7 x weekly - 1 sets - 3 reps - 30 sec hold     PATIENT EDUCATION: Education details: HEP updates/additions-see above  Person educated: Patient Education method: Explanation, Demonstration, Tactile cues, Verbal cues, and Handouts Education comprehension: verbalized understanding and returned demonstration   --------------------------------------------------------------------- Below measures were taken at time of initial evaluation unless otherwise specified:   DIAGNOSTIC FINDINGS: NA for this episode  COGNITION: Overall cognitive status: Within functional limits for tasks assessed   SENSATION: Light touch:  WFL and reports electric sensations at times   POSTURE: rounded shoulders and forward head  LOWER EXTREMITY ROM:   Active ROM WFL for BLEs in sitting, except some tightness noted in bilat hamstrings   LOWER EXTREMITY MMT:    MMT Right Eval Left Eval  Hip flexion 4+ 4+  Hip extension    Hip abduction    Hip adduction    Hip internal rotation    Hip external rotation    Knee flexion 4+ 4+  Knee extension 4+ 4+  Ankle dorsiflexion 4 4  Ankle plantarflexion    Ankle inversion    Ankle eversion    (Blank rows = not tested)  TRANSFERS: Assistive device utilized:  BUE support   Sit to stand: Modified independence and SBA Stand to sit: Modified independence and SBA  GAIT: Gait pattern:  decreased push off with gait, step through pattern, decreased stride length, and narrow BOS Distance walked: 200 ft Assistive device utilized: Single point cane, pt tends to carry cane for short distances, and None Level of assistance: SBA   FUNCTIONAL TESTS:  5 times sit to stand: 13.28 with BUE support; unable to perform without UE support Timed up and go (TUG): 16.65 sec (no device) 2 minute walk test: 190 ft (O2 94%, HR 78 bpm); stops x 2 10 meter walk test: 13.6 sec = 2.41 ft/sec Berg Balance Scale: 34/56 (Scores <45/56 indicate increased fall risk)   TODAY'S TREATMENT:                                                                                                                              DATE: 08/29/2022    PATIENT EDUCATION: Education details: PT eval results, POC Person educated: Patient Education method: Explanation Education comprehension: verbalized understanding  HOME EXERCISE PROGRAM: Not yet initiated  GOALS: Goals reviewed with patient? Yes  SHORT TERM GOALS: Target date: 09/28/2022  Pt will be independent with HEP for improved balance, strength. Baseline: Goal status: IN PROGRESS  2.  Pt will improve 5x sit<>stand to less than or equal to 15 sec without UE  support to demonstrate improved functional strength and transfer efficiency. Baseline: 13.28 sec with BUE support Goal status: IN PROGRESS  3.  Pt will improve TUG score to less than or equal to 13.5 sec for decreased fall risk. Baseline: 16.65 sec Goal status:IN PROGRESS   LONG TERM GOALS: Target  date: 10/26/2022  Pt will be independent with HEP for improved balance, strength, gait. Baseline:  Goal status: IN PROGRESS  2.  Pt will improve Berg score to at least 42/56 to decrease fall risk. Baseline: 34/56 Goal status: IN PROGRESS  3.  Pt will ambulate at least 250 ft in 2 MWT for improved gait efficiency and endurance. Baseline: 180 ft Goal status: IN PROGRESS  4.  Pt will verbalize plans for continued community fitness upon d/c from PT to maximize gains made in PT. Baseline:  Goal status: IN PROGRESS   ASSESSMENT:  CLINICAL IMPRESSION: Pt reports performing HEP from last visit, yesterday and this morning, reporting that he was worn out and couldn't complete them.  His primary c/o today is tightness in lower legs; therefore, utilized NuStep for aerobic warm up and then performed stretching exercises.  Worked also on standing balance activities, with short rest breaks between.  He demo good understanding of stretches performed today, with pt feeling good stretch.  He will continue to benefit from skilled PT towards goals for improved overall functional mobility. OBJECTIVE IMPAIRMENTS: Abnormal gait, decreased balance, decreased endurance, decreased mobility, difficulty walking, and decreased strength.   ACTIVITY LIMITATIONS: standing, transfers, and locomotion level  PARTICIPATION LIMITATIONS: shopping, community activity, and return to gym  PERSONAL FACTORS: 3+ comorbidities: PMH-see above  are also affecting patient's functional outcome.   REHAB POTENTIAL: Good  CLINICAL DECISION MAKING: Evolving/moderate complexity  EVALUATION COMPLEXITY: Moderate  PLAN:  PT  FREQUENCY:  2x/wk for 4 weeks, then 1x/wk for 4 weeks  PT DURATION: 8 weeks  PLANNED INTERVENTIONS: Therapeutic exercises, Therapeutic activity, Neuromuscular re-education, Balance training, Gait training, Patient/Family education, Self Care, Vestibular training, and DME instructions  PLAN FOR NEXT SESSION: Review stretches and progress HEP for BLE strengthening/balance, continue/progress his walking at home  Lonia Blood, PT 09/05/22 3:34 PM Phone: [phone] Fax: [phone]   [facility] at [facility] [address] [place], [place] [address] Phone # [phone] Fax # [phone]

## 2022-09-12 ENCOUNTER — Ambulatory Visit (HOSPITAL_COMMUNITY)
Admission: RE | Admit: 2022-09-12 | Discharge: 2022-09-12 | Disposition: A | Discharge status: Home / Self Care | Payer: Medicare Other | Source: Ambulatory Visit | Attending: Nephrology | Admitting: Nephrology

## 2022-09-12 VITALS — BP 107/60 | HR 70 | Temp 98.2°F | Resp 17

## 2022-09-12 DIAGNOSIS — N184 Chronic kidney disease, stage 4 (severe): Secondary | ICD-10-CM | POA: Diagnosis present

## 2022-09-12 LAB — RENAL FUNCTION PANEL
Albumin: 3.7 g/dL (ref 3.5–5.0)
Anion gap: 9 (ref 5–15)
BUN: 60 mg/dL — ABNORMAL HIGH (ref 8–23)
CO2: 24 mmol/L (ref 22–32)
Calcium: 9.7 mg/dL (ref 8.9–10.3)
Chloride: 106 mmol/L (ref 98–111)
Creatinine, Ser: 2.49 mg/dL — ABNORMAL HIGH (ref 0.61–1.24)
GFR, Estimated: 23 mL/min — ABNORMAL LOW (ref >60)
Glucose, Bld: 95 mg/dL (ref 70–99)
Phosphorus: 3.5 mg/dL (ref 2.5–4.6)
Potassium: 4.3 mmol/L (ref 3.5–5.1)
Sodium: 139 mmol/L (ref 135–145)

## 2022-09-12 LAB — POCT HEMOGLOBIN-HEMACUE: Hemoglobin: 10.8 g/dL — ABNORMAL LOW (ref 13.0–17.0)

## 2022-09-12 LAB — IRON AND TIBC
Iron: 60 ug/dL (ref 45–182)
Saturation Ratios: 22 % (ref 17.9–39.5)
TIBC: 270 ug/dL (ref 250–450)
UIBC: 210 ug/dL

## 2022-09-12 LAB — FERRITIN: Ferritin: 138 ng/mL (ref 24–336)

## 2022-09-12 MED ORDER — EPOETIN ALFA-EPBX 10000 UNIT/ML IJ SOLN: 20000.0000 [IU] | INTRAMUSCULAR | Status: DC

## 2022-09-12 MED ORDER — EPOETIN ALFA-EPBX 10000 UNIT/ML IJ SOLN
INTRAMUSCULAR | Status: AC
  Administered 2022-09-12: 20000 [IU] via SUBCUTANEOUS
  Filled 2022-09-12: qty 2

## 2022-09-12 NOTE — Therapy (Signed)
OUTPATIENT PHYSICAL THERAPY NEURO TREATMENT   Patient Name: [name] MRN: [id number] DOB:1926-08-18, 89 y.o., male Today's Date: 09/13/2022   PCP: Pincus Sanes, MD  REFERRING PROVIDER: Pincus Sanes, MD   END OF SESSION:  PT End of Session - 09/13/22 1359     Visit Number 4    Number of Visits 13    Date for PT Re-Evaluation 10/26/22    Authorization Type Medicare/Tricare    Progress Note Due on Visit 10    PT Start Time 1315    PT Stop Time 1356    PT Time Calculation (min) 41 min    Equipment Utilized During Treatment Gait belt    Activity Tolerance Patient tolerated treatment well    Behavior During Therapy WFL for tasks assessed/performed                Past Medical History:  Diagnosis Date   Anemia    AV block, 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 S/P stenting to mid RCA, prox PDA 06/1999. b. NSTEMI/CABG x 3 in 10/2011 with LIMA to LAD, SVG to PDA, and SVG to OM1.    Cephalalgia 07/14/2015   CHB (complete heart block) 05/03/2012   Overview:  Status post complete heart block heart rate 28 bpm, alternating with 2 to one AV block and drug for bradycardia.  Status post pacemaker implantation.status post Medtronic pacemaker implantation the 03/28/2012   Chronic diastolic CHF (congestive heart failure) 02/05/2018   Chronic gouty arthritis    Chronic UTI    a. Followed by Dr. [name] Caprice - colonized/asymptomatic - not on abx   CKD (chronic kidney disease)    stage 3, GFR 30-59 ml/min; stable with a creatinine around 1.9-2.0 followed by nephrology.   Constipation 03/01/2015   Symptoms and exam consistent with constipation. Abdominal exam is benign with no evidence of pain or obstruction. Discussed importance of increasing fiber and water intake coupled with physical activity to assist with bowel movements. Continue over-the-counter medication management as needed. Follow-up  if symptoms worsen or fail to improve.   Coronary atherosclerosis 11/27/2011   Overview:  Multivessel coronary artery disease recently diagnosed by catheterization 2013 History of stent placement with a heparin-coated stent 2001  Last Assessment & Plan:  Status post coronary bypass grafting.  No recurrent chest pain. Overview:  Overview:  S/P stenting to mid RCA, prox PDA 06/1999;  06/2007 Myoview: negative except for apical thinning, EF 68%, last Myoview August 10, 2008 with n   Degeneration of lumbar intervertebral disc    Degenerative disc disease, cervical, with radiculopathy 09/21/2008   Diastolic dysfunction 07/04/2017   Grade 1 DD on Echo 06/2017   Dyslipidemia    Dysphagia 02/06/2016   3/18 - DG Esophagus:  1. Mild esophageal dysmotility, likely presbyesophagus. 2. No other explanation for patient's symptoms. 3. Small hiatal hernia.  On swallow eval - evidence of cervical spine disease and that was likely contributing   Epistaxis 12/08/2020   Essential hypertension 07/20/2006   GERD (gastroesophageal reflux disease)    Gout 05/12/2018   Gouty arthritis of right great toe 09/03/2017   Left toe Injected January 31, 2018    Hyperpigmentation 11/04/2017   Internal hemorrhoids 08/15/2016   Left inguinal hernia 07/01/2019   Lumbar post-laminectomy syndrome 12/12/2017   Moderate aortic regurgitation 07/04/2017   Echo 06/2017:  EF 55-60%, mild LVH, grade 1 DD, mild AS, mod  AR, mild MR, mild-mod TR   Neck injury    a. 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  This was switched to by  mouth amiodarone at the time of the thoracic surgery appointment was discontinued.   Presence of aortocoronary bypass graft 10/24/2011   Overview:  Performed at [facility] 2013 Last Assessment & Plan:  No complication post coronary bypass grafting.   Prostate cancer (HCC)    a. 2001 s/p TURP.   Pulmonary nodule    a. felt to be noncancerous.  Status post followup CT scan 4 mm and stable.   Renal artery stenosis    a. 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 Excell Seltzer & Dr. 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   Trochanteric bursitis of hip, bilateral 01/10/2017   UGIB (upper gastrointestinal bleed) 02/01/2018   EGD  02/06/18 - mod, non-erosive gastritis   Venous insufficiency of leg 06/05/2010   Vitamin B12 deficiency 08/23/2009   Jan '14  July '14 B12 level  >1500    472   Weakness of both lower extremities 04/09/2020   Past Surgical History:  Procedure Laterality Date   ANTERIOR CHAMBER WASHOUT Left 10/04/2018   Procedure: Anterior Chamber Washout, Vitreous Tap;  Surgeon: Carmela Rima, MD;  Location: [facility] OR;  Service: Ophthalmology;  Laterality: Left;   cardia catherization  07/07/1999   CARDIAC CATHETERIZATION     CARDIAC SURGERY  10/18/2012   open heart surgery   CATARACT EXTRACTION W/ INTRAOCULAR LENS  IMPLANT, BILATERAL  3/205, 06/2013   mccuen   COLONOSCOPY  04/12/2007   CORONARY ARTERY BYPASS GRAFT  10/19/2011   Procedure: CORONARY ARTERY BYPASS GRAFTING (CABG);  Surgeon: Alleen Borne, MD;  Location: [facility] OR;  Service: Open Heart Surgery;  Laterality: N/A;  times three using Left Internal Mammary Artery and Right Greater Saphenouse Vein Graft harvested Endoscopically   edg  07/17/1994   FLEXIBLE  SIGMOIDOSCOPY  11/03/1997   GAS INSERTION Left 10/04/2018   Procedure: Insertion Of Gas;  Surgeon: Carmela Rima, MD;  Location: [facility] OR;  Service: Ophthalmology;  Laterality: Left;   GAS/FLUID EXCHANGE Left 10/04/2018   Procedure: Gas/Fluid Exchange;  Surgeon: Carmela Rima, MD;  Location: [facility] OR;  Service: Ophthalmology;  Laterality: Left;   INTRAOPERATIVE TRANSTHORACIC ECHOCARDIOGRAM N/A 02/06/2022   Procedure: INTRAOPERATIVE TRANSTHORACIC ECHOCARDIOGRAM;  Surgeon: Tonny Bollman, MD;  Location: [facility] INVASIVE CV LAB;  Service: Open Heart Surgery;  Laterality: N/A;   LEFT HEART CATHETERIZATION WITH CORONARY ANGIOGRAM N/A 10/16/2011   Procedure: LEFT HEART CATHETERIZATION WITH CORONARY ANGIOGRAM;  Surgeon: Peter M Swaziland, MD;  Location: [facility] CATH LAB;  Service: Cardiovascular;  Laterality: N/A;   LEFT HEART CATHETERIZATION WITH CORONARY/GRAFT ANGIOGRAM N/A 03/11/2013   Procedure: LEFT HEART CATHETERIZATION WITH [name] Caprice;  Surgeon: Micheline Chapman, MD;  Location: [facility] CATH LAB;  Service: Cardiovascular;  Laterality: N/A;   lumbar spinal disk and neck fusion surgery     PACEMAKER INSERTION  03/28/2012   PPM implanted for mobitz II AV block   PARS PLANA VITRECTOMY Left 10/04/2018   Procedure: PARS PLANA VITRECTOMY 25 GAUGE FOR ENDOPHTHALMITIS WITH INJECTION OF INTRAVITREAL ANTIBIOTIC;  Surgeon: Carmela Rima, MD;  Location: [facility] OR;  Service: Ophthalmology;  Laterality: Left;   peripheral vascular catherization  11/24/2003   PERMANENT PACEMAKER INSERTION N/A 03/28/2012   Procedure: PERMANENT PACEMAKER INSERTION;  Surgeon: Hillis Range, MD;  Location: [facility] CATH LAB;  Service: Cardiovascular;  Laterality: N/A;   PROSTATECTOMY     renal circulation  10/01/2003   RIGHT HEART CATH N/A 03/02/2022   Procedure: RIGHT HEART CATH;  Surgeon: Runell Gess, MD;  Location: [facility] INVASIVE CV LAB;  Service: Cardiovascular;  Laterality: N/A;   s/p ptca     stents     X 2   stress cardiolite   05/04/2005   spring 09-negative except for apical thinning, EF 68%   TRANSCATHETER AORTIC VALVE REPLACEMENT, TRANSFEMORAL Right 02/06/2022   Procedure: Transcatheter Aortic Valve Replacement, Transfemoral;  Surgeon: Tonny Bollman, MD;  Location: [facility] INVASIVE CV LAB;  Service: Open Heart Surgery;  Laterality: Right;   Patient Active Problem List   Diagnosis Date Noted   Dizziness 08/13/2022   Generalized weakness 08/13/2022   Community acquired pneumonia 06/07/2022   Dementia with psychotic disturbance (HCC) 05/22/2022   Gait abnormality 05/22/2022   Candida infection 03/09/2022   Erythema 03/09/2022   DNR (do not resuscitate)/DNI(Do Not Intubate) 02/28/2022   Acute renal failure superimposed on stage 4 chronic kidney disease (HCC) 02/28/2022   Elevated LFTs 02/28/2022   Acute urinary retention 02/28/2022   Pleural effusion on right 02/28/2022   Hallucinations 02/27/2022   Anemia of chronic disease 02/21/2022   (HFpEF) heart failure with preserved ejection fraction (HCC) 02/21/2022   Thrombocytopenia (HCC) 02/21/2022   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   History of cervical discectomy 09/21/2021   Polyarthralgia    Chronic gouty arthritis    Bilateral plantar fasciitis 10/25/2020   Weakness of both lower extremities 04/09/2020   S/P inguinal hernia repair 08/13/2019   Left inguinal hernia 07/01/2019   Lumbar post-laminectomy syndrome 12/12/2017   Spinal stenosis of lumbar region 10/09/2017   Frequent urination 09/03/2017   Gouty arthritis of right great toe 09/03/2017   Anasarca 06/29/2017   Trochanteric bursitis of hip, bilateral 01/10/2017   Internal hemorrhoids 08/15/2016   Cephalalgia 07/14/2015   Cough 02/01/2015   Postoperative atrial fibrillation 05/03/2012   Pacemaker 04/10/2012   AV block, 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 03/26/2012   Presence of aortocoronary bypass graft 10/24/2011   Pulmonary nodule    CAD (coronary artery  disease)    CKD (chronic kidney disease) stage 4, GFR 15-29 ml/min (HCC)    Venous insufficiency of leg 06/05/2010   Insomnia 11/09/2009   Pain in joint, shoulder region 10/14/2009   Vitamin B12 deficiency 08/23/2009   Peripheral neuropathy 03/21/2009   Degenerative disc disease, cervical, with radiculopathy 09/21/2008   Vertigo 08/29/2007   Dyslipidemia 01/17/2007   Essential hypertension 07/20/2006   Prostate cancer 07/20/2006    ONSET DATE: 08/13/2022 (MD referral)  REFERRING DIAG:  G62.89 (ICD-10-CM) - Other polyneuropathy  R42 (ICD-10-CM) - Dizziness  R53.1 (ICD-10-CM) - Generalized weakness    THERAPY DIAG:  Unsteadiness on feet  Other abnormalities of gait and mobility  Rationale for Evaluation and Treatment: Rehabilitation  SUBJECTIVE:  SUBJECTIVE STATEMENT: "When I left here last week I felt fine, but the next morning I couldn't even walk. The muscles just tightened up, felt like someone was just squeezing them. "  Pt accompanied by: self  PERTINENT HISTORY: chronic low BP, low stamina, reports of dizziness-see complete PMH above; hx of valve replacement in January, per pt report; hx of vertigo  PAIN:  Are you having pain? Yes: NPRS scale: 7/10 Pain location: soles of feet  Pain description: tightness Aggravating factors: unsure Relieving factors: unsure  PRECAUTIONS: Fall decreased vision due to macular degeneration  RED FLAGS: None   WEIGHT BEARING RESTRICTIONS: No  FALLS: Has patient fallen in last 6 months? No  LIVING ENVIRONMENT: Lives with: lives with their spouse Lives in: House/apartment Stairs: 2 steps to enter home Has following equipment at home: Single point cane  PLOF: Independent with household mobility with device and Independent with community  mobility with device.  Goes (occasionally) to [employer] at [employer] and works on some [employer] and stretches  PATIENT GOALS: Goals for therapy would be to help with pain/balance  OBJECTIVE:     TODAY'S TREATMENT: 09/13/22 Activity Comments  review of HEP update: sitting HS stretch 30" sitting gastroc stretch with strap 30" standing gastroc stretch 30"  Tolerated well; cues for proper positioning   toe tap on 1st, 2nd step, down 10x each Weaned to no UE support; c/o calf tightness. Cues for coordination and CGA-min A for stability   standing gastroc stretch 30"  To relieve tightness   side step ups 6" 10x each Good quad control; cues to lift head and look ahead occasionally   LAQ 3# 2x10 each  Good control   backwards walking CGA; cueing for step through rather than step to pattern   [place] At counter; difficulty coordinating alternating step directions   figure 8 turns With and without cane; good sequencing and stability       Access Code: [email or web address] URL: [email or web address] Date: 09/05/2022 Prepared by: [facility] Outpatient  Rehab - [facility]  Exercises - Sit to Stand  - 1 x daily - 5 x weekly - 2 sets - 10 reps - Forward Step Up  - 1 x daily - 5 x weekly - 2 sets - 10 reps - Backward Walking with Counter Support  - 1 x daily - 5 x weekly - 2 sets - 10 reps - Seated Hamstring Stretch  - 1-2 x daily - 7 x weekly - 1 sets - 3 reps - 30 sec hold - Seated Gastroc Stretch with Strap  - 1-2 x daily - 7 x weekly - 1 sets - 3 reps - 30 sec hold - Standing Gastroc Stretch at Counter  - 1-2 x daily - 7 x weekly - 1 sets - 3 reps - 30 sec hold     --------------------------------------------------------------------- Below measures were taken at time of initial evaluation unless otherwise specified:   DIAGNOSTIC FINDINGS: NA for this episode  COGNITION: Overall cognitive status: Within functional limits for tasks assessed   SENSATION: Light  touch: WFL and reports electric sensations at times   POSTURE: rounded shoulders and forward head  LOWER EXTREMITY ROM:   Active ROM WFL for BLEs in sitting, except some tightness noted in bilat hamstrings   LOWER EXTREMITY MMT:    MMT Right Eval Left Eval  Hip flexion 4+ 4+  Hip extension    Hip abduction    Hip adduction    Hip internal rotation  Hip external rotation    Knee flexion 4+ 4+  Knee extension 4+ 4+  Ankle dorsiflexion 4 4  Ankle plantarflexion    Ankle inversion    Ankle eversion    (Blank rows = not tested)  TRANSFERS: Assistive device utilized:  BUE support   Sit to stand: Modified independence and SBA Stand to sit: Modified independence and SBA  GAIT: Gait pattern:  decreased push off with gait, step through pattern, decreased stride length, and narrow BOS Distance walked: 200 ft Assistive device utilized: Single point cane, pt tends to carry cane for short distances, and None Level of assistance: SBA   FUNCTIONAL TESTS:  5 times sit to stand: 13.28 with BUE support; unable to perform without UE support Timed up and go (TUG): 16.65 sec (no device) 2 minute walk test: 190 ft (O2 94%, HR 78 bpm); stops x 2 10 meter walk test: 13.6 sec = 2.41 ft/sec Berg Balance Scale: 34/56 (Scores <45/56 indicate increased fall risk)   TODAY'S TREATMENT:                                                                                                                              DATE: 08/29/2022    PATIENT EDUCATION: Education details: PT eval results, POC Person educated: Patient Education method: Explanation Education comprehension: verbalized understanding  HOME EXERCISE PROGRAM: Not yet initiated  GOALS: Goals reviewed with patient? Yes  SHORT TERM GOALS: Target date: 09/28/2022  Pt will be independent with HEP for improved balance, strength. Baseline: Goal status: IN PROGRESS  2.  Pt will improve 5x sit<>stand to less than or equal to 15 sec  without UE support to demonstrate improved functional strength and transfer efficiency. Baseline: 13.28 sec with BUE support Goal status: IN PROGRESS  3.  Pt will improve TUG score to less than or equal to 13.5 sec for decreased fall risk. Baseline: 16.65 sec Goal status:IN PROGRESS   LONG TERM GOALS: Target date: 10/26/2022  Pt will be independent with HEP for improved balance, strength, gait. Baseline:  Goal status: IN PROGRESS  2.  Pt will improve Berg score to at least 42/56 to decrease fall risk. Baseline: 34/56 Goal status: IN PROGRESS  3.  Pt will ambulate at least 250 ft in 2 MWT for improved gait efficiency and endurance. Baseline: 180 ft Goal status: IN PROGRESS  4.  Pt will verbalize plans for continued community fitness upon d/c from PT to maximize gains made in PT. Baseline:  Goal status: IN PROGRESS   ASSESSMENT:  CLINICAL IMPRESSION: Patient arrived to session with report of LE muscle tightness causing difficulty walking after last session without specific mechanism. Reviewed gentle stretching from HEP update which was tolerated well today. Encouraged patient to perform these exercises if muscle tightness returns/worsens. Proceeded with balance training with focus on SLS, backwards walking, multidirectional stepping. D/t c/o calf tightness after standing activities, sitting ther-ex was performed to allow this to settle. Patient was  able to proceed with standing activities after this break. No complaints at end of session. Patient is progressing well towards goals.  OBJECTIVE IMPAIRMENTS: Abnormal gait, decreased balance, decreased endurance, decreased mobility, difficulty walking, and decreased strength.   ACTIVITY LIMITATIONS: standing, transfers, and locomotion level  PARTICIPATION LIMITATIONS: shopping, community activity, and return to gym  PERSONAL FACTORS: 3+ comorbidities: PMH-see above  are also affecting patient's functional outcome.   REHAB POTENTIAL:  Good  CLINICAL DECISION MAKING: Evolving/moderate complexity  EVALUATION COMPLEXITY: Moderate  PLAN:  PT FREQUENCY:  2x/wk for 4 weeks, then 1x/wk for 4 weeks  PT DURATION: 8 weeks  PLANNED INTERVENTIONS: Therapeutic exercises, Therapeutic activity, Neuromuscular re-education, Balance training, Gait training, Patient/Family education, Self Care, Vestibular training, and DME instructions  PLAN FOR NEXT SESSION: progress HEP for BLE strengthening/balance, continue/progress his walking at home  Anette Guarneri, PT, DPT 09/13/22 2:00 PM  [facility] at [facility] [address] [place], [place] [address] Phone # [phone] Fax # [phone]

## 2022-09-13 ENCOUNTER — Ambulatory Visit: Payer: Medicare Other | Attending: Internal Medicine | Admitting: Physical Therapy

## 2022-09-13 ENCOUNTER — Encounter: Payer: Self-pay | Admitting: Physical Therapy

## 2022-09-13 DIAGNOSIS — R2689 Other abnormalities of gait and mobility: Secondary | ICD-10-CM | POA: Insufficient documentation

## 2022-09-13 DIAGNOSIS — R2681 Unsteadiness on feet: Secondary | ICD-10-CM | POA: Insufficient documentation

## 2022-09-13 DIAGNOSIS — R262 Difficulty in walking, not elsewhere classified: Secondary | ICD-10-CM | POA: Insufficient documentation

## 2022-09-13 DIAGNOSIS — M6281 Muscle weakness (generalized): Secondary | ICD-10-CM | POA: Insufficient documentation

## 2022-09-13 LAB — PTH, INTACT AND CALCIUM
Calcium, Total (PTH): 9.6 mg/dL (ref 8.6–10.2)
PTH: 13 pg/mL — ABNORMAL LOW (ref 15–65)

## 2022-09-17 NOTE — Therapy (Signed)
OUTPATIENT PHYSICAL THERAPY NEURO TREATMENT   Patient Name: [name] MRN: [id number] DOB:November 14, 1926, 89 y.o., male Today's Date: 09/17/2022   PCP: Pincus Sanes, MD  REFERRING PROVIDER: Pincus Sanes, MD   END OF SESSION:       Past Medical History:  Diagnosis Date   Anemia    AV block, 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 S/P stenting to mid RCA, prox PDA 06/1999. b. NSTEMI/CABG x 3 in 10/2011 with LIMA to LAD, SVG to PDA, and SVG to OM1.    Cephalalgia 07/14/2015   CHB (complete heart block) 05/03/2012   Overview:  Status post complete heart block heart rate 28 bpm, alternating with 2 to one AV block and drug for bradycardia.  Status post pacemaker implantation.status post Medtronic pacemaker implantation the 03/28/2012   Chronic diastolic CHF (congestive heart failure) 02/05/2018   Chronic gouty arthritis    Chronic UTI    a. Followed by Dr. [name] Caprice - colonized/asymptomatic - not on abx   CKD (chronic kidney disease)    stage 3, GFR 30-59 ml/min; stable with a creatinine around 1.9-2.0 followed by nephrology.   Constipation 03/01/2015   Symptoms and exam consistent with constipation. Abdominal exam is benign with no evidence of pain or obstruction. Discussed importance of increasing fiber and water intake coupled with physical activity to assist with bowel movements. Continue over-the-counter medication management as needed. Follow-up if symptoms worsen or fail to improve.   Coronary atherosclerosis 11/27/2011   Overview:  Multivessel coronary artery disease recently diagnosed by catheterization 2013 History of stent placement with a heparin-coated stent 2001  Last Assessment & Plan:  Status post coronary bypass grafting.  No recurrent chest pain. Overview:  Overview:  S/P stenting to mid RCA, prox PDA 06/1999;  06/2007 Myoview: negative except for apical thinning, EF 68%, last  Myoview August 10, 2008 with n   Degeneration of lumbar intervertebral disc    Degenerative disc disease, cervical, with radiculopathy 09/21/2008   Diastolic dysfunction 07/04/2017   Grade 1 DD on Echo 06/2017   Dyslipidemia    Dysphagia 02/06/2016   3/18 - DG Esophagus:  1. Mild esophageal dysmotility, likely presbyesophagus. 2. No other explanation for patient's symptoms. 3. Small hiatal hernia.  On swallow eval - evidence of cervical spine disease and that was likely contributing   Epistaxis 12/08/2020   Essential hypertension 07/20/2006   GERD (gastroesophageal reflux disease)    Gout 05/12/2018   Gouty arthritis of right great toe 09/03/2017   Left toe Injected January 31, 2018    Hyperpigmentation 11/04/2017   Internal hemorrhoids 08/15/2016   Left inguinal hernia 07/01/2019   Lumbar post-laminectomy syndrome 12/12/2017   Moderate aortic regurgitation 07/04/2017   Echo 06/2017:  EF 55-60%, mild LVH, grade 1 DD, mild AS, mod AR, mild MR, mild-mod TR   Neck injury    a. 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  This was switched to by mouth amiodarone at the time of the thoracic surgery appointment was discontinued.   Presence of aortocoronary bypass  graft 10/24/2011   Overview:  Performed at [facility] 2013 Last Assessment & Plan:  No complication post coronary bypass grafting.   Prostate cancer (HCC)    a. 2001 s/p TURP.   Pulmonary nodule    a. felt to be noncancerous.  Status post followup CT scan 4 mm and stable.   Renal artery stenosis    a. 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 Excell Seltzer & Dr. 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   Trochanteric bursitis of hip, bilateral 01/10/2017   UGIB (upper gastrointestinal bleed) 02/01/2018   EGD  02/06/18 - mod, non-erosive gastritis   Venous insufficiency of leg 06/05/2010   Vitamin B12 deficiency 08/23/2009   Jan '14  July '14 B12 level  >1500    472   Weakness of both lower extremities 04/09/2020   Past Surgical History:  Procedure Laterality Date   ANTERIOR CHAMBER WASHOUT Left 10/04/2018   Procedure: Anterior Chamber Washout, Vitreous Tap;  Surgeon: Carmela Rima, MD;  Location: [facility] OR;  Service: Ophthalmology;  Laterality: Left;   cardia catherization  07/07/1999   CARDIAC CATHETERIZATION     CARDIAC SURGERY  10/18/2012   open heart surgery   CATARACT EXTRACTION W/ INTRAOCULAR LENS  IMPLANT, BILATERAL  3/205, 06/2013   mccuen   COLONOSCOPY  04/12/2007   CORONARY ARTERY BYPASS GRAFT  10/19/2011   Procedure: CORONARY ARTERY BYPASS GRAFTING (CABG);  Surgeon: Alleen Borne, MD;  Location: [facility] OR;  Service: Open Heart Surgery;  Laterality: N/A;  times three using Left Internal Mammary Artery and Right Greater Saphenouse Vein Graft harvested Endoscopically   edg  07/17/1994   FLEXIBLE SIGMOIDOSCOPY  11/03/1997   GAS INSERTION Left 10/04/2018   Procedure: Insertion Of Gas;  Surgeon: Carmela Rima, MD;  Location: [facility] OR;  Service: Ophthalmology;  Laterality: Left;   GAS/FLUID EXCHANGE Left 10/04/2018   Procedure: Gas/Fluid Exchange;  Surgeon: Carmela Rima, MD;  Location: [facility] OR;  Service: Ophthalmology;  Laterality: Left;   INTRAOPERATIVE TRANSTHORACIC ECHOCARDIOGRAM N/A 02/06/2022   Procedure: INTRAOPERATIVE TRANSTHORACIC  ECHOCARDIOGRAM;  Surgeon: Tonny Bollman, MD;  Location: [facility] INVASIVE CV LAB;  Service: Open Heart Surgery;  Laterality: N/A;   LEFT HEART CATHETERIZATION WITH CORONARY ANGIOGRAM N/A 10/16/2011   Procedure: LEFT HEART CATHETERIZATION WITH CORONARY ANGIOGRAM;  Surgeon: Peter M Swaziland, MD;  Location: [facility] CATH LAB;  Service: Cardiovascular;  Laterality: N/A;   LEFT HEART CATHETERIZATION WITH CORONARY/GRAFT ANGIOGRAM N/A 03/11/2013   Procedure: LEFT HEART CATHETERIZATION WITH [name] Caprice;  Surgeon: Micheline Chapman, MD;  Location: [facility] CATH LAB;  Service: Cardiovascular;  Laterality: N/A;   lumbar spinal disk and neck fusion surgery     PACEMAKER INSERTION  03/28/2012   PPM implanted for mobitz II AV block   PARS PLANA VITRECTOMY Left 10/04/2018   Procedure: PARS PLANA VITRECTOMY 25 GAUGE FOR ENDOPHTHALMITIS WITH INJECTION OF INTRAVITREAL ANTIBIOTIC;  Surgeon: Carmela Rima, MD;  Location: [facility] OR;  Service: Ophthalmology;  Laterality: Left;   peripheral vascular catherization  11/24/2003   PERMANENT PACEMAKER INSERTION N/A 03/28/2012   Procedure: PERMANENT PACEMAKER INSERTION;  Surgeon: Hillis Range, MD;  Location: [facility] CATH LAB;  Service: Cardiovascular;  Laterality: N/A;   PROSTATECTOMY  renal circulation  10/01/2003   RIGHT HEART CATH N/A 03/02/2022   Procedure: RIGHT HEART CATH;  Surgeon: Runell Gess, MD;  Location: [facility] INVASIVE CV LAB;  Service: Cardiovascular;  Laterality: N/A;   s/p ptca     stents     X 2   stress cardiolite  05/04/2005   spring 09-negative except for apical thinning, EF 68%   TRANSCATHETER AORTIC VALVE REPLACEMENT, TRANSFEMORAL Right 02/06/2022   Procedure: Transcatheter Aortic Valve Replacement, Transfemoral;  Surgeon: Tonny Bollman, MD;  Location: [facility] INVASIVE CV LAB;  Service: Open Heart Surgery;  Laterality: Right;   Patient Active Problem List   Diagnosis Date Noted   Dizziness 08/13/2022   Generalized weakness 08/13/2022   Community acquired  pneumonia 06/07/2022   Dementia with psychotic disturbance (HCC) 05/22/2022   Gait abnormality 05/22/2022   Candida infection 03/09/2022   Erythema 03/09/2022   DNR (do not resuscitate)/DNI(Do Not Intubate) 02/28/2022   Acute renal failure superimposed on stage 4 chronic kidney disease (HCC) 02/28/2022   Elevated LFTs 02/28/2022   Acute urinary retention 02/28/2022   Pleural effusion on right 02/28/2022   Hallucinations 02/27/2022   Anemia of chronic disease 02/21/2022   (HFpEF) heart failure with preserved ejection fraction (HCC) 02/21/2022   Thrombocytopenia (HCC) 02/21/2022   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   History of cervical discectomy 09/21/2021   Polyarthralgia    Chronic gouty arthritis    Bilateral plantar fasciitis 10/25/2020   Weakness of both lower extremities 04/09/2020   S/P inguinal hernia repair 08/13/2019   Left inguinal hernia 07/01/2019   Lumbar post-laminectomy syndrome 12/12/2017   Spinal stenosis of lumbar region 10/09/2017   Frequent urination 09/03/2017   Gouty arthritis of right great toe 09/03/2017   Anasarca 06/29/2017   Trochanteric bursitis of hip, bilateral 01/10/2017   Internal hemorrhoids 08/15/2016   Cephalalgia 07/14/2015   Cough 02/01/2015   Postoperative atrial fibrillation 05/03/2012   Pacemaker 04/10/2012   AV block, 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 03/26/2012   Presence of aortocoronary bypass graft 10/24/2011   Pulmonary nodule    CAD (coronary artery disease)    CKD (chronic kidney disease) stage 4, GFR 15-29 ml/min (HCC)    Venous insufficiency of leg 06/05/2010   Insomnia 11/09/2009   Pain in joint, shoulder region 10/14/2009   Vitamin B12 deficiency 08/23/2009   Peripheral neuropathy 03/21/2009   Degenerative disc disease, cervical, with radiculopathy 09/21/2008   Vertigo 08/29/2007   Dyslipidemia 01/17/2007   Essential hypertension 07/20/2006   Prostate cancer 07/20/2006    ONSET DATE:  08/13/2022 (MD referral)  REFERRING DIAG:  G62.89 (ICD-10-CM) - Other polyneuropathy  R42 (ICD-10-CM) - Dizziness  R53.1 (ICD-10-CM) - Generalized weakness    THERAPY DIAG:  No diagnosis found.  Rationale for Evaluation and Treatment: Rehabilitation  SUBJECTIVE:  SUBJECTIVE STATEMENT: "When I left here last week I felt fine, but the next morning I couldn't even walk. The muscles just tightened up, felt like someone was just squeezing them. "  Pt accompanied by: self  PERTINENT HISTORY: chronic low BP, low stamina, reports of dizziness-see complete PMH above; hx of valve replacement in January, per pt report; hx of vertigo  PAIN:  Are you having pain? Yes: NPRS scale: 7/10 Pain location: soles of feet  Pain description: tightness Aggravating factors: unsure Relieving factors: unsure  PRECAUTIONS: Fall decreased vision due to macular degeneration  RED FLAGS: None   WEIGHT BEARING RESTRICTIONS: No  FALLS: Has patient fallen in last 6 months? No  LIVING ENVIRONMENT: Lives with: lives with their spouse Lives in: House/apartment Stairs: 2 steps to enter home Has following equipment at home: Single point cane  PLOF: Independent with household mobility with device and Independent with community mobility with device.  Goes (occasionally) to [employer] at [employer] and works on some [employer] and stretches  PATIENT GOALS: Goals for therapy would be to help with pain/balance  OBJECTIVE:     TODAY'S TREATMENT: 09/18/22 Activity Comments                       Access Code: [email or web address] URL: [email or web address] Date: 09/05/2022 Prepared by: [facility] - Outpatient  Rehab - [facility]  Exercises - Sit to Stand  - 1 x daily - 5 x weekly - 2 sets - 10 reps -  Forward Step Up  - 1 x daily - 5 x weekly - 2 sets - 10 reps - Backward Walking with Counter Support  - 1 x daily - 5 x weekly - 2 sets - 10 reps - Seated Hamstring Stretch  - 1-2 x daily - 7 x weekly - 1 sets - 3 reps - 30 sec hold - Seated Gastroc Stretch with Strap  - 1-2 x daily - 7 x weekly - 1 sets - 3 reps - 30 sec hold - Standing Gastroc Stretch at Counter  - 1-2 x daily - 7 x weekly - 1 sets - 3 reps - 30 sec hold     --------------------------------------------------------------------- Below measures were taken at time of initial evaluation unless otherwise specified:   DIAGNOSTIC FINDINGS: NA for this episode  COGNITION: Overall cognitive status: Within functional limits for tasks assessed   SENSATION: Light touch: WFL and reports electric sensations at times   POSTURE: rounded shoulders and forward head  LOWER EXTREMITY ROM:   Active ROM WFL for BLEs in sitting, except some tightness noted in bilat hamstrings   LOWER EXTREMITY MMT:    MMT Right Eval Left Eval  Hip flexion 4+ 4+  Hip extension    Hip abduction    Hip adduction    Hip internal rotation    Hip external rotation    Knee flexion 4+ 4+  Knee extension 4+ 4+  Ankle dorsiflexion 4 4  Ankle plantarflexion    Ankle inversion    Ankle eversion    (Blank rows = not tested)  TRANSFERS: Assistive device utilized:  BUE support   Sit to stand: Modified independence and SBA Stand to sit: Modified independence and SBA  GAIT: Gait pattern:  decreased push off with gait, step through pattern, decreased stride length, and narrow BOS Distance walked: 200 ft Assistive device utilized: Single point cane, pt tends to carry cane for short distances, and None Level of assistance: SBA   FUNCTIONAL  TESTS:  5 times sit to stand: 13.28 with BUE support; unable to perform without UE support Timed up and go (TUG): 16.65 sec (no device) 2 minute walk test: 190 ft (O2 94%, HR 78 bpm); stops x 2 10 meter walk  test: 13.6 sec = 2.41 ft/sec Berg Balance Scale: 34/56 (Scores <45/56 indicate increased fall risk)   TODAY'S TREATMENT:                                                                                                                              DATE: 08/29/2022    PATIENT EDUCATION: Education details: PT eval results, POC Person educated: Patient Education method: Explanation Education comprehension: verbalized understanding  HOME EXERCISE PROGRAM: Not yet initiated  GOALS: Goals reviewed with patient? Yes  SHORT TERM GOALS: Target date: 09/28/2022  Pt will be independent with HEP for improved balance, strength. Baseline: Goal status: IN PROGRESS  2.  Pt will improve 5x sit<>stand to less than or equal to 15 sec without UE support to demonstrate improved functional strength and transfer efficiency. Baseline: 13.28 sec with BUE support Goal status: IN PROGRESS  3.  Pt will improve TUG score to less than or equal to 13.5 sec for decreased fall risk. Baseline: 16.65 sec Goal status:IN PROGRESS   LONG TERM GOALS: Target date: 10/26/2022  Pt will be independent with HEP for improved balance, strength, gait. Baseline:  Goal status: IN PROGRESS  2.  Pt will improve Berg score to at least 42/56 to decrease fall risk. Baseline: 34/56 Goal status: IN PROGRESS  3.  Pt will ambulate at least 250 ft in 2 MWT for improved gait efficiency and endurance. Baseline: 180 ft Goal status: IN PROGRESS  4.  Pt will verbalize plans for continued community fitness upon d/c from PT to maximize gains made in PT. Baseline:  Goal status: IN PROGRESS   ASSESSMENT:  CLINICAL IMPRESSION: Patient arrived to session with report of LE muscle tightness causing difficulty walking after last session without specific mechanism. Reviewed gentle stretching from HEP update which was tolerated well today. Encouraged patient to perform these exercises if muscle tightness returns/worsens. Proceeded with  balance training with focus on SLS, backwards walking, multidirectional stepping. D/t c/o calf tightness after standing activities, sitting ther-ex was performed to allow this to settle. Patient was able to proceed with standing activities after this break. No complaints at end of session. Patient is progressing well towards goals.  OBJECTIVE IMPAIRMENTS: Abnormal gait, decreased balance, decreased endurance, decreased mobility, difficulty walking, and decreased strength.   ACTIVITY LIMITATIONS: standing, transfers, and locomotion level  PARTICIPATION LIMITATIONS: shopping, community activity, and return to gym  PERSONAL FACTORS: 3+ comorbidities: PMH-see above  are also affecting patient's functional outcome.   REHAB POTENTIAL: Good  CLINICAL DECISION MAKING: Evolving/moderate complexity  EVALUATION COMPLEXITY: Moderate  PLAN:  PT FREQUENCY:  2x/wk for 4 weeks, then 1x/wk for 4 weeks  PT DURATION: 8 weeks  PLANNED INTERVENTIONS: Therapeutic exercises,  Therapeutic activity, Neuromuscular re-education, Balance training, Gait training, Patient/Family education, Self Care, Vestibular training, and DME instructions  PLAN FOR NEXT SESSION: progress HEP for BLE strengthening/balance, continue/progress his walking at home  Anette Guarneri, PT, DPT 09/17/22 1:48 PM  [facility] at [facility] [address] [place], [place] [address] Phone # [phone] Fax # [phone]

## 2022-09-18 ENCOUNTER — Encounter: Payer: Self-pay | Admitting: Physical Therapy

## 2022-09-18 ENCOUNTER — Ambulatory Visit: Payer: Medicare Other | Admitting: Physical Therapy

## 2022-09-18 DIAGNOSIS — R262 Difficulty in walking, not elsewhere classified: Secondary | ICD-10-CM

## 2022-09-18 DIAGNOSIS — R2681 Unsteadiness on feet: Secondary | ICD-10-CM | POA: Diagnosis not present

## 2022-09-18 DIAGNOSIS — R2689 Other abnormalities of gait and mobility: Secondary | ICD-10-CM

## 2022-09-18 DIAGNOSIS — M6281 Muscle weakness (generalized): Secondary | ICD-10-CM

## 2022-09-19 NOTE — Therapy (Signed)
OUTPATIENT PHYSICAL THERAPY NEURO TREATMENT   Patient Name: [name] MRN: [id number] DOB:02/06/1926, 89 y.o., male Today's Date: 09/20/2022   PCP: Pincus Sanes, MD  REFERRING PROVIDER: Pincus Sanes, MD   END OF SESSION:  PT End of Session - 09/20/22 1436     Visit Number 6    Number of Visits 13    Date for PT Re-Evaluation 10/26/22    Authorization Type Medicare/Tricare    Progress Note Due on Visit 10    PT Start Time 1400    PT Stop Time 1438    PT Time Calculation (min) 38 min    Equipment Utilized During Treatment Gait belt    Activity Tolerance Patient tolerated treatment well    Behavior During Therapy WFL for tasks assessed/performed                  Past Medical History:  Diagnosis Date   Anemia    AV block, 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 S/P stenting to mid RCA, prox PDA 06/1999. b. NSTEMI/CABG x 3 in 10/2011 with LIMA to LAD, SVG to PDA, and SVG to OM1.    Cephalalgia 07/14/2015   CHB (complete heart block) 05/03/2012   Overview:  Status post complete heart block heart rate 28 bpm, alternating with 2 to one AV block and drug for bradycardia.  Status post pacemaker implantation.status post Medtronic pacemaker implantation the 03/28/2012   Chronic diastolic CHF (congestive heart failure) 02/05/2018   Chronic gouty arthritis    Chronic UTI    a. Followed by Dr. [name] Caprice - colonized/asymptomatic - not on abx   CKD (chronic kidney disease)    stage 3, GFR 30-59 ml/min; stable with a creatinine around 1.9-2.0 followed by nephrology.   Constipation 03/01/2015   Symptoms and exam consistent with constipation. Abdominal exam is benign with no evidence of pain or obstruction. Discussed importance of increasing fiber and water intake coupled with physical activity to assist with bowel movements. Continue over-the-counter medication management as needed.  Follow-up if symptoms worsen or fail to improve.   Coronary atherosclerosis 11/27/2011   Overview:  Multivessel coronary artery disease recently diagnosed by catheterization 2013 History of stent placement with a heparin-coated stent 2001  Last Assessment & Plan:  Status post coronary bypass grafting.  No recurrent chest pain. Overview:  Overview:  S/P stenting to mid RCA, prox PDA 06/1999;  06/2007 Myoview: negative except for apical thinning, EF 68%, last Myoview August 10, 2008 with n   Degeneration of lumbar intervertebral disc    Degenerative disc disease, cervical, with radiculopathy 09/21/2008   Diastolic dysfunction 07/04/2017   Grade 1 DD on Echo 06/2017   Dyslipidemia    Dysphagia 02/06/2016   3/18 - DG Esophagus:  1. Mild esophageal dysmotility, likely presbyesophagus. 2. No other explanation for patient's symptoms. 3. Small hiatal hernia.  On swallow eval - evidence of cervical spine disease and that was likely contributing   Epistaxis 12/08/2020   Essential hypertension 07/20/2006   GERD (gastroesophageal reflux disease)    Gout 05/12/2018   Gouty arthritis of right great toe 09/03/2017   Left toe Injected January 31, 2018    Hyperpigmentation 11/04/2017   Internal hemorrhoids 08/15/2016   Left inguinal hernia 07/01/2019   Lumbar post-laminectomy syndrome 12/12/2017   Moderate aortic regurgitation 07/04/2017   Echo 06/2017:  EF 55-60%, mild LVH, grade 1 DD, mild  AS, mod AR, mild MR, mild-mod TR   Neck injury    a. 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  This was  switched to by mouth amiodarone at the time of the thoracic surgery appointment was discontinued.   Presence of aortocoronary bypass graft 10/24/2011   Overview:  Performed at [facility] 2013 Last Assessment & Plan:  No complication post coronary bypass grafting.   Prostate cancer (HCC)    a. 2001 s/p TURP.   Pulmonary nodule    a. felt to be noncancerous.  Status post followup CT scan 4 mm and stable.   Renal artery stenosis    a. 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 Excell Seltzer & Dr. 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   Trochanteric bursitis of hip, bilateral 01/10/2017   UGIB (upper gastrointestinal bleed) 02/01/2018   EGD  02/06/18 - mod, non-erosive gastritis   Venous insufficiency of leg 06/05/2010   Vitamin B12 deficiency 08/23/2009   Jan '14  July '14 B12 level  >1500    472   Weakness of both lower extremities 04/09/2020   Past Surgical History:  Procedure Laterality Date   ANTERIOR CHAMBER WASHOUT Left 10/04/2018   Procedure: Anterior Chamber Washout, Vitreous Tap;  Surgeon: Carmela Rima, MD;  Location: [facility] OR;  Service: Ophthalmology;  Laterality: Left;   cardia catherization  07/07/1999   CARDIAC CATHETERIZATION     CARDIAC SURGERY  10/18/2012   open heart surgery   CATARACT EXTRACTION W/ INTRAOCULAR LENS  IMPLANT, BILATERAL  3/205, 06/2013   mccuen   COLONOSCOPY  04/12/2007   CORONARY ARTERY BYPASS GRAFT  10/19/2011   Procedure: CORONARY ARTERY BYPASS GRAFTING (CABG);  Surgeon: Alleen Borne, MD;  Location: [facility] OR;  Service: Open Heart Surgery;  Laterality: N/A;  times three using Left Internal Mammary Artery and Right Greater Saphenouse Vein Graft harvested Endoscopically   edg  07/17/1994    FLEXIBLE SIGMOIDOSCOPY  11/03/1997   GAS INSERTION Left 10/04/2018   Procedure: Insertion Of Gas;  Surgeon: Carmela Rima, MD;  Location: [facility] OR;  Service: Ophthalmology;  Laterality: Left;   GAS/FLUID EXCHANGE Left 10/04/2018   Procedure: Gas/Fluid Exchange;  Surgeon: Carmela Rima, MD;  Location: [facility] OR;  Service: Ophthalmology;  Laterality: Left;   INTRAOPERATIVE TRANSTHORACIC ECHOCARDIOGRAM N/A 02/06/2022   Procedure: INTRAOPERATIVE TRANSTHORACIC ECHOCARDIOGRAM;  Surgeon: Tonny Bollman, MD;  Location: [facility] INVASIVE CV LAB;  Service: Open Heart Surgery;  Laterality: N/A;   LEFT HEART CATHETERIZATION WITH CORONARY ANGIOGRAM N/A 10/16/2011   Procedure: LEFT HEART CATHETERIZATION WITH CORONARY ANGIOGRAM;  Surgeon: Peter M Swaziland, MD;  Location: [facility] CATH LAB;  Service: Cardiovascular;  Laterality: N/A;   LEFT HEART CATHETERIZATION WITH CORONARY/GRAFT ANGIOGRAM N/A 03/11/2013   Procedure: LEFT HEART CATHETERIZATION WITH [name] Caprice;  Surgeon: Micheline Chapman, MD;  Location: [facility] CATH LAB;  Service: Cardiovascular;  Laterality: N/A;   lumbar spinal disk and neck fusion surgery     PACEMAKER INSERTION  03/28/2012   PPM implanted for mobitz II AV block   PARS PLANA VITRECTOMY Left 10/04/2018   Procedure: PARS PLANA VITRECTOMY 25 GAUGE FOR ENDOPHTHALMITIS WITH INJECTION OF INTRAVITREAL ANTIBIOTIC;  Surgeon: Carmela Rima, MD;  Location: [facility] OR;  Service: Ophthalmology;  Laterality: Left;   peripheral vascular catherization  11/24/2003   PERMANENT PACEMAKER INSERTION N/A 03/28/2012   Procedure: PERMANENT PACEMAKER INSERTION;  Surgeon: Hillis Range, MD;  Location: [facility] C[facility] LAB;  Service: Cardiovascular;  Laterality: N/A;   PROSTATECTOMY     renal circulation  10/01/2003   RIGHT HEART CATH N/A 03/02/2022   Procedure: RIGHT HEART CATH;  Surgeon: Runell Gess, MD;  Location: [facility] INVASIVE CV LAB;  Service: Cardiovascular;  Laterality: N/A;   s/p ptca     stents     X 2   stress  cardiolite  05/04/2005   spring 09-negative except for apical thinning, EF 68%   TRANSCATHETER AORTIC VALVE REPLACEMENT, TRANSFEMORAL Right 02/06/2022   Procedure: Transcatheter Aortic Valve Replacement, Transfemoral;  Surgeon: Tonny Bollman, MD;  Location: [facility] INVASIVE CV LAB;  Service: Open Heart Surgery;  Laterality: Right;   Patient Active Problem List   Diagnosis Date Noted   Dizziness 08/13/2022   Generalized weakness 08/13/2022   Community acquired pneumonia 06/07/2022   Dementia with psychotic disturbance (HCC) 05/22/2022   Gait abnormality 05/22/2022   Candida infection 03/09/2022   Erythema 03/09/2022   DNR (do not resuscitate)/DNI(Do Not Intubate) 02/28/2022   Acute renal failure superimposed on stage 4 chronic kidney disease (HCC) 02/28/2022   Elevated LFTs 02/28/2022   Acute urinary retention 02/28/2022   Pleural effusion on right 02/28/2022   Hallucinations 02/27/2022   Anemia of chronic disease 02/21/2022   (HFpEF) heart failure with preserved ejection fraction (HCC) 02/21/2022   Thrombocytopenia (HCC) 02/21/2022   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   History of cervical discectomy 09/21/2021   Polyarthralgia    Chronic gouty arthritis    Bilateral plantar fasciitis 10/25/2020   Weakness of both lower extremities 04/09/2020   S/P inguinal hernia repair 08/13/2019   Left inguinal hernia 07/01/2019   Lumbar post-laminectomy syndrome 12/12/2017   Spinal stenosis of lumbar region 10/09/2017   Frequent urination 09/03/2017   Gouty arthritis of right great toe 09/03/2017   Anasarca 06/29/2017   Trochanteric bursitis of hip, bilateral 01/10/2017   Internal hemorrhoids 08/15/2016   Cephalalgia 07/14/2015   Cough 02/01/2015   Postoperative atrial fibrillation 05/03/2012   Pacemaker 04/10/2012   AV block, 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 03/26/2012   Presence of aortocoronary bypass graft 10/24/2011   Pulmonary nodule    CAD (coronary  artery disease)    CKD (chronic kidney disease) stage 4, GFR 15-29 ml/min (HCC)    Venous insufficiency of leg 06/05/2010   Insomnia 11/09/2009   Pain in joint, shoulder region 10/14/2009   Vitamin B12 deficiency 08/23/2009   Peripheral neuropathy 03/21/2009   Degenerative disc disease, cervical, with radiculopathy 09/21/2008   Vertigo 08/29/2007   Dyslipidemia 01/17/2007   Essential hypertension 07/20/2006   Prostate cancer 07/20/2006    ONSET DATE: 08/13/2022 (MD referral)  REFERRING DIAG:  G62.89 (ICD-10-CM) - Other polyneuropathy  R42 (ICD-10-CM) - Dizziness  R53.1 (ICD-10-CM) - Generalized weakness    THERAPY DIAG:  Unsteadiness on feet  Other abnormalities of gait and mobility  Muscle weakness (generalized)  Difficulty in walking,  not elsewhere classified  Rationale for Evaluation and Treatment: Rehabilitation  SUBJECTIVE:                                                                                                                                                                                             SUBJECTIVE STATEMENT: Went [place] and feels like he is paying for it from the hips down. Had to find a seat while at the [place] d/t fatigue  Pt accompanied by: self  PERTINENT HISTORY: chronic low BP, low stamina, reports of dizziness-see complete PMH above; hx of valve replacement in January, per pt report; hx of vertigo  PAIN:  Are you having pain? Yes: NPRS scale: 7-8/10 Pain location: B LEs Pain description: tightness Aggravating factors: unsure Relieving factors: unsure  PRECAUTIONS: Fall decreased vision due to macular degeneration  RED FLAGS: None   WEIGHT BEARING RESTRICTIONS: No  FALLS: Has patient fallen in last 6 months? No  LIVING ENVIRONMENT: Lives with: lives with their spouse Lives in: House/apartment Stairs: 2 steps to enter home Has following equipment at home: Single point  cane  PLOF: Independent with household mobility with device and Independent with community mobility with device.  Goes (occasionally) to [employer] at [employer] and works on some [employer] and stretches  PATIENT GOALS: Goals for therapy would be to help with pain/balance  OBJECTIVE:    TODAY'S TREATMENT: 09/20/22 Activity Comments  Vitals at start of session  122/75 , 71 pm, 98% spO2   Nustep L4 x 6 min UEs/LEs For ROM and endurance; maintaining ~80SPM  bridge 2x10 bridge ball 2x10   Hooklying fig 4 stretch 30" each LE KTOS 30" each LE SKTC 30" each LE Supine HS stretch with strap 2x30" Tolerated well; cues for hand position/LE positioning for max stretch and comfort. C/o some lightheadedness upon sitting up which resolved in seconds   standing B heel raises 2x10  At TM rail   Runner's stretch 2x30" each           Access Code: [email or web address] URL: [email or web address] Date: 09/05/2022 Prepared by: [facility] - [facility]  Exercises - Sit to Stand  - 1 x daily - 5 x weekly - 2 sets - 10 reps - Forward Step Up  - 1 x daily - 5 x weekly - 2 sets - 10 reps - Backward Walking with Counter Support  - 1 x daily - 5 x weekly - 2 sets - 10 reps - Seated Hamstring Stretch  - 1-2 x daily - 7 x weekly - 1 sets -  3 reps - 30 sec hold - Seated Gastroc Stretch with Strap  - 1-2 x daily - 7 x weekly - 1 sets - 3 reps - 30 sec hold - Standing Gastroc Stretch at Counter  - 1-2 x daily - 7 x weekly - 1 sets - 3 reps - 30 sec hold     --------------------------------------------------------------------- Below measures were taken at time of initial evaluation unless otherwise specified:   DIAGNOSTIC FINDINGS: NA for this episode  COGNITION: Overall cognitive status: Within functional limits for tasks assessed   SENSATION: Light touch: WFL and reports electric sensations at times   POSTURE: rounded shoulders and forward head  LOWER EXTREMITY  ROM:   Active ROM WFL for BLEs in sitting, except some tightness noted in bilat hamstrings   LOWER EXTREMITY MMT:    MMT Right Eval Left Eval  Hip flexion 4+ 4+  Hip extension    Hip abduction    Hip adduction    Hip internal rotation    Hip external rotation    Knee flexion 4+ 4+  Knee extension 4+ 4+  Ankle dorsiflexion 4 4  Ankle plantarflexion    Ankle inversion    Ankle eversion    (Blank rows = not tested)  TRANSFERS: Assistive device utilized:  BUE support   Sit to stand: Modified independence and SBA Stand to sit: Modified independence and SBA  GAIT: Gait pattern:  decreased push off with gait, step through pattern, decreased stride length, and narrow BOS Distance walked: 200 ft Assistive device utilized: Single point cane, pt tends to carry cane for short distances, and None Level of assistance: SBA   FUNCTIONAL TESTS:  5 times sit to stand: 13.28 with BUE support; unable to perform without UE support Timed up and go (TUG): 16.65 sec (no device) 2 minute walk test: 190 ft (O2 94%, HR 78 bpm); stops x 2 10 meter walk test: 13.6 sec = 2.41 ft/sec Berg Balance Scale: 34/56 (Scores <45/56 indicate increased fall risk)   TODAY'S TREATMENT:                                                                                                                              DATE: 08/29/2022    PATIENT EDUCATION: Education details: PT eval results, POC Person educated: Patient Education method: Explanation Education comprehension: verbalized understanding  HOME EXERCISE PROGRAM: Not yet initiated  GOALS: Goals reviewed with patient? Yes  SHORT TERM GOALS: Target date: 09/28/2022  Pt will be independent with HEP for improved balance, strength. Baseline: Goal status: IN PROGRESS  2.  Pt will improve 5x sit<>stand to less than or equal to 15 sec without UE support to demonstrate improved functional strength and transfer efficiency. Baseline: 13.28 sec with BUE  support Goal status: IN PROGRESS  3.  Pt will improve TUG score to less than or equal to 13.5 sec for decreased fall risk. Baseline: 16.65 sec Goal status:IN PROGRESS   LONG TERM  GOALS: Target date: 10/26/2022  Pt will be independent with HEP for improved balance, strength, gait. Baseline:  Goal status: IN PROGRESS  2.  Pt will improve Berg score to at least 42/56 to decrease fall risk. Baseline: 34/56 Goal status: IN PROGRESS  3.  Pt will ambulate at least 250 ft in 2 MWT for improved gait efficiency and endurance. Baseline: 180 ft Goal status: IN PROGRESS  4.  Pt will verbalize plans for continued community fitness upon d/c from PT to maximize gains made in PT. Baseline:  Goal status: IN PROGRESS   ASSESSMENT:  CLINICAL IMPRESSION: Patient arrived to session with report of increased B LE pain after walking in the cemetery yesterday. Vitals at start of session WNL. Proceeded with NuStep for LE ROM/stretching and endurance as patient also reports he had to find a seat while at the cemetery d/t fatigue. LE stretching was performed to address LE pain and tightness. Patient tolerated less intense session today d/t c/o LE pain. Reported decrease in LE tightness at end of session.  OBJECTIVE IMPAIRMENTS: Abnormal gait, decreased balance, decreased endurance, decreased mobility, difficulty walking, and decreased strength.   ACTIVITY LIMITATIONS: standing, transfers, and locomotion level  PARTICIPATION LIMITATIONS: shopping, community activity, and return to gym  PERSONAL FACTORS: 3+ comorbidities: PMH-see above  are also affecting patient's functional outcome.   REHAB POTENTIAL: Good  CLINICAL DECISION MAKING: Evolving/moderate complexity  EVALUATION COMPLEXITY: Moderate  PLAN:  PT FREQUENCY:  2x/wk for 4 weeks, then 1x/wk for 4 weeks  PT DURATION: 8 weeks  PLANNED INTERVENTIONS: Therapeutic exercises, Therapeutic activity, Neuromuscular re-education, Balance training,  Gait training, Patient/Family education, Self Care, Vestibular training, and DME instructions  PLAN FOR NEXT SESSION: progress HEP for BLE strengthening/balance, continue/progress his walking at home  Anette Guarneri, PT, DPT 09/20/22 2:42 PM  [facility] [address], [address] [place], [place] [address] Phone # [phone] Fax # [phone]

## 2022-09-20 ENCOUNTER — Ambulatory Visit: Payer: Medicare Other | Admitting: Physical Therapy

## 2022-09-20 ENCOUNTER — Encounter: Payer: Self-pay | Admitting: Physical Therapy

## 2022-09-20 DIAGNOSIS — R2689 Other abnormalities of gait and mobility: Secondary | ICD-10-CM

## 2022-09-20 DIAGNOSIS — R2681 Unsteadiness on feet: Secondary | ICD-10-CM

## 2022-09-20 DIAGNOSIS — R262 Difficulty in walking, not elsewhere classified: Secondary | ICD-10-CM

## 2022-09-20 DIAGNOSIS — M6281 Muscle weakness (generalized): Secondary | ICD-10-CM

## 2022-09-25 ENCOUNTER — Ambulatory Visit: Payer: Medicare Other | Admitting: Physical Therapy

## 2022-09-26 ENCOUNTER — Ambulatory Visit (INDEPENDENT_AMBULATORY_CARE_PROVIDER_SITE_OTHER): Payer: Medicare Other

## 2022-09-26 VITALS — Ht 67.5 in | Wt 151.0 lb

## 2022-09-26 DIAGNOSIS — Z0000 Encounter for general adult medical examination without abnormal findings: Secondary | ICD-10-CM | POA: Diagnosis not present

## 2022-09-26 NOTE — Patient Instructions (Signed)
[name] , Thank you for taking time to come for your Medicare Wellness Visit. I appreciate your ongoing commitment to your health goals. Please review the following plan we discussed and let me know if I can assist you in the future.   Referrals/Orders/Follow-Ups/Clinician Recommendations: No  This is a list of the screening recommended for you and due dates:  Health Maintenance  Topic Date Due   COVID-19 Vaccine (4 - 2023-24 season) 09/09/2022   Flu Shot  04/08/2023*   Medicare Annual Wellness Visit  09/26/2023   DTaP/Tdap/Td vaccine (3 - Td or Tdap) 04/12/2026   Pneumonia Vaccine  Completed   Zoster (Shingles) Vaccine  Completed   HPV Vaccine  Aged Out  *Topic was postponed. The date shown is not the original due date.    Advanced directives: (Copy Requested) Please bring a copy of your health care power of attorney and living will to the office to be added to your chart at your convenience.  Next Medicare Annual Wellness Visit scheduled for next year: No  *insert Preventive Care attachment *Insert FALL PREVENTION attachment

## 2022-09-26 NOTE — Progress Notes (Signed)
Subjective:   [name] is a 89 y.o. male who presents for Medicare Annual/Subsequent preventive examination.  Visit Complete: Virtual  I connected with  [name] on 09/26/22 by a audio enabled telemedicine application and verified that I am speaking with the correct person using two identifiers.  Patient Location: Home  Provider Location: Office/Clinic  I discussed the limitations of evaluation and management by telemedicine. The patient expressed understanding and agreed to proceed.  Patient provided his/her weight during this virtual phone visit.  Vital Signs: Because this visit was a virtual/telehealth visit, some criteria may be missing or patient reported. Any vitals not documented were not able to be obtained and vitals that have been documented are patient reported.    Cardiac Risk Factors include: advanced age (>48men, >9 women);family history of premature cardiovascular disease;hypertension;male gender;sedentary lifestyle     Objective:    Today's Vitals   09/26/22 1347 09/26/22 1348  Weight: 151 lb (68.5 kg)   Height: 5' 7.5" (1.715 m)   PainSc: 7  7   PainLoc: Knee    Body mass index is 23.3 kg/m.     09/26/2022    1:50 PM 08/29/2022    3:33 PM 04/01/2022    3:41 AM 02/28/2022    3:16 PM 02/22/2022    1:00 AM 02/06/2022    8:00 AM 10/28/2021    3:24 PM  Advanced Directives  Does Patient Have a Medical Advance Directive? Yes Yes No Yes Yes Yes No  Type of Estate agent of [place];Living will Healthcare Power of [place];Living will  Healthcare Power of [place];Living will Healthcare Power of [employer] of [place];Living will   Does patient want to make changes to medical advance directive?  No - Patient declined  Yes (Inpatient - patient defers changing a medical advance directive at this time - Information given) No - Patient declined No - Patient declined   Copy of Healthcare Power of Attorney in Chart?  No - copy requested   No - copy requested Yes - validated most recent copy scanned in chart (See row information) Yes - validated most recent copy scanned in chart (See row information)   Would patient like information on creating a medical advance directive?   No - Patient declined    No - Patient declined    Current Medications (verified) Outpatient Encounter Medications as of 09/26/2022  Medication Sig   acetaminophen (TYLENOL) 500 MG tablet Take 1,000 mg by mouth 3 (three) times daily as needed for moderate pain.   allopurinol (ZYLOPRIM) 100 MG tablet TAKE 1 TABLET(100 MG) BY MOUTH DAILY (Patient taking differently: Take 100 mg by mouth daily. TAKE 1 TABLET(100 MG) BY MOUTH DAILY)   amoxicillin (AMOXIL) 500 MG capsule Take 2,000 mg by mouth as directed. 1 HOUR PRIOR TO DENTAL CLEANINGS AND PROCEDURES   Aromatic Inhalants (VICKS VAPOR INHALER IN) Place 1 puff into both nostrils as needed (for congestion).   Ascorbic Acid (VITAMIN C) 1000 MG tablet Take 1,000 mg by mouth daily.   aspirin EC 81 MG tablet Take 81 mg by mouth daily.   Calcium Citrate-Vitamin D (CITRACAL + D PO) Take 2 tablets by mouth in the morning and at bedtime.   Carboxymeth-Glycerin-Polysorb (REFRESH OPTIVE MEGA-3 OP) Place 1 drop into both eyes 2 (two) times daily.   carvedilol (COREG) 3.125 MG tablet Take 1 tablet (3.125 mg total) by mouth 2 (two) times daily.   Cholecalciferol 25 MCG (1000 UT) capsule Take 1,000 Units  by mouth daily. (Patient not taking: Reported on 09/04/2022)   cyanocobalamin (,VITAMIN B-12,) 1000 MCG/ML injection Inject 1,000 mcg into the muscle once. Monthly injection   folic acid (FOLVITE) 1 MG tablet Take 1 tablet (1 mg total) by mouth daily. Annual appt due in May must see provider for future refills (Patient taking differently: Take 1 mg by mouth at bedtime. Annual appt due in May must see provider for future refills)   furosemide (LASIX) 40 MG tablet Take 1 tablet by mouth every Monday, Wednesday,  and Friday.  Take an extra pill three times per week prn   hydrALAZINE (APRESOLINE) 100 MG tablet Take 1 tablet (100 mg total) by mouth 3 (three) times daily.   loratadine (CLARITIN) 10 MG tablet Take 10 mg by mouth daily as needed (for seasonal allergies).   Multiple Vitamins-Minerals (PRESERVISION AREDS PO) Take by mouth.   nitroGLYCERIN (NITROSTAT) 0.4 MG SL tablet DISSOLVE 1 TABLET UNDER THE TONGUE EVERY 5 MINUTES AS NEEDED FOR CHEST PAIN, MAXIMUM 3 TABLETS   Saline (AYR NASAL MIST ALLERGY/SINUS NA) Place 2 sprays into the nose as needed (dryness).   trolamine salicylate (ASPERCREME) 10 % cream Apply 1 Application topically in the morning and at bedtime. For leg cramps   Vitamin D, Ergocalciferol, (DRISDOL) 1.25 MG (50000 UNIT) CAPS capsule Take 50,000 Units by mouth every 30 (thirty) days.   Facility-Administered Encounter Medications as of 09/26/2022  Medication   NON FORMULARY 1 application    Allergies (verified) Aspirin, Lisinopril, Amoxicillin, Atarax [hydroxyzine hcl], Cephalexin, Ciprofloxacin, Clindamycin, Clobetasol, Codeine, Fish allergy, Fluarix [influenza virus vaccine], Haemophilus influenzae, Hydrocodone, Hydrocodone-acetaminophen, Hydroxyzine, Latex, Macrobid [nitrofurantoin macrocrystal], Niacin, Niacin-lovastatin er, Niacin-lovastatin er, Nitrofurantoin, Omeprazole, Other, Tramadol, Vibramycin [doxycycline], Adhesive [tape], Bactrim [sulfamethoxazole-trimethoprim], Colchicine, Gabapentin, and Nortriptyline   History: Past Medical History:  Diagnosis Date   Anemia    AV block, 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 S/P stenting to mid RCA, prox PDA 06/1999. b. NSTEMI/CABG x 3 in 10/2011 with LIMA to LAD, SVG to PDA, and SVG to OM1.    Cephalalgia 07/14/2015   CHB (complete heart block) 05/03/2012   Overview:  Status post complete heart block heart rate 28 bpm, alternating with 2 to  one AV block and drug for bradycardia.  Status post pacemaker implantation.status post Medtronic pacemaker implantation the 03/28/2012   Chronic diastolic CHF (congestive heart failure) 02/05/2018   Chronic gouty arthritis    Chronic UTI    a. Followed by Dr. [name] Caprice - colonized/asymptomatic - not on abx   CKD (chronic kidney disease)    stage 3, GFR 30-59 ml/min; stable with a creatinine around 1.9-2.0 followed by nephrology.   Constipation 03/01/2015   Symptoms and exam consistent with constipation. Abdominal exam is benign with no evidence of pain or obstruction. Discussed importance of increasing fiber and water intake coupled with physical activity to assist with bowel movements. Continue over-the-counter medication management as needed. Follow-up if symptoms worsen or fail to improve.   Coronary atherosclerosis 11/27/2011   Overview:  Multivessel coronary artery disease recently diagnosed by catheterization 2013 History of stent placement with a heparin-coated stent 2001  Last Assessment & Plan:  Status post coronary bypass grafting.  No recurrent chest pain. Overview:  Overview:  S/P stenting to mid RCA, prox PDA 06/1999;  06/2007 Myoview: negative except for apical thinning, EF 68%, last Myoview August 10, 2008 with n   Degeneration of lumbar  intervertebral disc    Degenerative disc disease, cervical, with radiculopathy 09/21/2008   Diastolic dysfunction 07/04/2017   Grade 1 DD on Echo 06/2017   Dyslipidemia    Dysphagia 02/06/2016   3/18 - DG Esophagus:  1. Mild esophageal dysmotility, likely presbyesophagus. 2. No other explanation for patient's symptoms. 3. Small hiatal hernia.  On swallow eval - evidence of cervical spine disease and that was likely contributing   Epistaxis 12/08/2020   Essential hypertension 07/20/2006   GERD (gastroesophageal reflux disease)    Gout 05/12/2018   Gouty arthritis of right great toe 09/03/2017   Left toe Injected January 31, 2018    Hyperpigmentation  11/04/2017   Internal hemorrhoids 08/15/2016   Left inguinal hernia 07/01/2019   Lumbar post-laminectomy syndrome 12/12/2017   Moderate aortic regurgitation 07/04/2017   Echo 06/2017:  EF 55-60%, mild LVH, grade 1 DD, mild AS, mod AR, mild MR, mild-mod TR   Neck injury    a. 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  This was switched to by mouth amiodarone at the time of the thoracic surgery appointment was discontinued.   Presence of aortocoronary bypass graft 10/24/2011   Overview:  Performed at [facility] 2013 Last Assessment & Plan:  No complication post coronary bypass grafting.   Prostate cancer (HCC)    a. 2001 s/p TURP.   Pulmonary nodule    a. felt to be noncancerous.  Status post followup CT scan 4 mm and stable.   Renal artery stenosis    a. 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 Excell Seltzer & Dr. 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   Trochanteric bursitis of hip, bilateral 01/10/2017   UGIB (upper gastrointestinal bleed) 02/01/2018   EGD  02/06/18 - mod, non-erosive gastritis   Venous insufficiency of leg 06/05/2010   Vitamin B12 deficiency 08/23/2009   Jan '14  July '14 B12 level  >1500    472   Weakness of both lower extremities 04/09/2020   Past Surgical History:  Procedure Laterality Date    ANTERIOR CHAMBER WASHOUT Left 10/04/2018   Procedure: Anterior Chamber Washout, Vitreous Tap;  Surgeon: Carmela Rima, MD;  Location: [facility] OR;  Service: Ophthalmology;  Laterality: Left;   cardia catherization  07/07/1999   CARDIAC CATHETERIZATION     CARDIAC SURGERY  10/18/2012   open heart surgery   CATARACT EXTRACTION W/ INTRAOCULAR LENS  IMPLANT, BILATERAL  3/205, 06/2013   [email or web address]   COLONOSCOPY  04/12/2007   CORONARY ARTERY BYPASS GRAFT  10/19/2011   Procedure: CORONARY ARTERY BYPASS GRAFTING (CABG);  Surgeon: Alleen Borne, MD;  Location: [facility] OR;  Service: Open Heart Surgery;  Laterality: N/A;  times three using Left Internal Mammary Artery and Right Greater Saphenouse Vein Graft harvested Endoscopically   edg  07/17/1994   FLEXIBLE SIGMOIDOSCOPY  11/03/1997  GAS INSERTION Left 10/04/2018   Procedure: Insertion Of Gas;  Surgeon: Carmela Rima, MD;  Location: [facility] OR;  Service: Ophthalmology;  Laterality: Left;   GAS/FLUID EXCHANGE Left 10/04/2018   Procedure: Gas/Fluid Exchange;  Surgeon: Carmela Rima, MD;  Location: [facility] OR;  Service: Ophthalmology;  Laterality: Left;   INTRAOPERATIVE TRANSTHORACIC ECHOCARDIOGRAM N/A 02/06/2022   Procedure: INTRAOPERATIVE TRANSTHORACIC ECHOCARDIOGRAM;  Surgeon: Tonny Bollman, MD;  Location: [facility] INVASIVE CV LAB;  Service: Open Heart Surgery;  Laterality: N/A;   LEFT HEART CATHETERIZATION WITH CORONARY ANGIOGRAM N/A 10/16/2011   Procedure: LEFT HEART CATHETERIZATION WITH CORONARY ANGIOGRAM;  Surgeon: Peter M Swaziland, MD;  Location: [facility] CATH LAB;  Service: Cardiovascular;  Laterality: N/A;   LEFT HEART CATHETERIZATION WITH CORONARY/GRAFT ANGIOGRAM N/A 03/11/2013   Procedure: LEFT HEART CATHETERIZATION WITH [name] Caprice;  Surgeon: Micheline Chapman, MD;  Location: [facility] CATH LAB;  Service: Cardiovascular;  Laterality: N/A;   lumbar spinal disk and neck fusion surgery     PACEMAKER INSERTION  03/28/2012   PPM implanted for mobitz II AV block    PARS PLANA VITRECTOMY Left 10/04/2018   Procedure: PARS PLANA VITRECTOMY 25 GAUGE FOR ENDOPHTHALMITIS WITH INJECTION OF INTRAVITREAL ANTIBIOTIC;  Surgeon: Carmela Rima, MD;  Location: [facility] OR;  Service: Ophthalmology;  Laterality: Left;   peripheral vascular catherization  11/24/2003   PERMANENT PACEMAKER INSERTION N/A 03/28/2012   Procedure: PERMANENT PACEMAKER INSERTION;  Surgeon: Hillis Range, MD;  Location: [facility] CATH LAB;  Service: Cardiovascular;  Laterality: N/A;   PROSTATECTOMY     renal circulation  10/01/2003   RIGHT HEART CATH N/A 03/02/2022   Procedure: RIGHT HEART CATH;  Surgeon: Runell Gess, MD;  Location: [facility] INVASIVE CV LAB;  Service: Cardiovascular;  Laterality: N/A;   s/p ptca     stents     X 2   stress cardiolite  05/04/2005   spring 09-negative except for apical thinning, EF 68%   TRANSCATHETER AORTIC VALVE REPLACEMENT, TRANSFEMORAL Right 02/06/2022   Procedure: Transcatheter Aortic Valve Replacement, Transfemoral;  Surgeon: Tonny Bollman, MD;  Location: [facility] INVASIVE CV LAB;  Service: Open Heart Surgery;  Laterality: Right;   Family History  Problem Relation Age of Onset   Coronary artery disease Father        died @ 43   Other Mother        cerebral hemorrhage - died @ 44   Arthritis Mother    Cancer Brother        Bladder   Prostate cancer Brother    Nephritis Brother        died @ age 95.   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  married '52 - 3 years, divorced; married '56 - 3 years divorced; married '63-12 yrs - divorced; married '75 -. 1 son '57; 1 daughter - '53; 1 grandchild.  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  Very happily married.  End of life care: yes CPR, no long term mechanical ventilation, no heroic measures.right handed   Right handed   Social Determinants of Health   Financial Resource Strain: Low Risk  (09/26/2022)   Overall Financial Resource Strain (CARDIA)    Difficulty of Paying Living Expenses: Not hard at all  Food Insecurity: No Food Insecurity (09/26/2022)   Hunger Vital Sign    Worried About Running Out of Food in the Last Year: Never true    Ran Out of Food in the Last Year: Never true  Transportation Needs: No Transportation Needs (09/26/2022)   PRAPARE - Administrator, Civil Service (Medical): No    Lack of Transportation (Non-Medical): No  Physical Activity: Insufficiently Active (09/26/2022)   Exercise Vital Sign    Days of Exercise per Week: 2 days    Minutes of Exercise per Session: 30 min  Stress: No Stress Concern Present (09/26/2022)   [employer] of Occupational Health - Occupational Stress Questionnaire    Feeling of Stress : Not at all  Social Connections: Socially Integrated (09/26/2022)   Social Connection and Isolation Panel [NHANES]    Frequency of Communication with Friends and Family: Three times a week    Frequency of Social Gatherings with Friends and Family: Three times a week    Attends Religious Services: More than 4 times per year    Active Member of Clubs or  Organizations: Yes    Attends Banker Meetings: 1 to 4 times per year    Marital Status: Married    Tobacco Counseling Counseling given: Not Answered   Clinical Intake:  Pre-visit preparation completed: Yes  Pain : 0-10 Pain Score: 7  Pain Type: Chronic pain Pain Location: Knee Pain Orientation: Left, Right     BMI - recorded: 23.3 Nutritional Status: BMI of 19-24  Normal Nutritional Risks: None Diabetes: No  How often do you need to have someone help you when you read instructions, pamphlets, or other written materials from your doctor or pharmacy?: 1 - Never What is the last grade level you completed in school?: 1 YEAR OF COLLEGE  Interpreter Needed?: No  Information entered by :: Susie Cassette, LPN.   Activities of Daily Living    09/26/2022    1:54 PM 02/28/2022    3:23 PM  In your present state of health, do you have any difficulty performing the following activities:  Hearing? 0   Vision? 0   Difficulty concentrating or making decisions? 1   Walking or climbing stairs? 1   Dressing or bathing? 0   Doing errands, shopping? 1 1  Preparing Food and eating ? N   Using the Toilet? N   In the past six months, have you accidently leaked urine? Y   Do you have problems with loss of bowel control? N   Managing your Medications? N   Managing your Finances? N  Housekeeping or managing your Housekeeping? N     Patient Care Team: Pincus Sanes, MD as PCP - General (Internal Medicine) Tonny Bollman, MD as PCP - Cardiology (Cardiology) Mealor, Roberts Gaudy, MD as PCP - Electrophysiology (Cardiology) Barron Alvine, MD (Inactive) (Urology) Elvis Coil, MD (Nephrology) Nita Sells, MD (Dermatology) Maris Berger, MD (Ophthalmology) Ranee Gosselin, MD (Orthopedic Surgery) Drema Dallas, DO as Consulting Physician (Neurology) Sheran Luz, MD as Consulting Physician (Physical Medicine and Rehabilitation) Sharrell Ku, MD as Consulting  Physician (Gastroenterology)  Indicate any recent Medical Services you may have received from other than [facility] providers in the past year (date may be approximate).     Assessment:   This is a routine wellness examination for [place].  Hearing/Vision screen Hearing Screening - Comments:: Patient denied any hearing difficulty.   No hearing aids.   Vision Screening - Comments:: Patient does wear corrective lenses/contacts.  Annual eye exam done by: Dr. Allena Katz Dx: Macular Degeneration    Goals Addressed             This Visit's Progress    Patient Stated: To eliminate the problems with my legs and feet.        Depression Screen    09/26/2022    1:52 PM 08/13/2022    1:20 PM 07/30/2022    4:09 PM 05/17/2022    2:09 PM 03/09/2022    2:27 PM 11/03/2021    2:14 PM 08/02/2021    4:13 PM  PHQ 2/9 Scores  PHQ - 2 Score 2 2 0 0 0 0 1  PHQ- 9 Score 10 10 2 5   7     Fall Risk    09/26/2022    1:52 PM 08/13/2022    1:20 PM 07/30/2022    2:43 PM 07/23/2022    2:26 PM 07/20/2022    2:23 PM  Fall Risk   Falls in the past year? 0 0 1 1 1   Number falls in past yr: 0 0 1 1 1   Injury with Fall? 0 0 1 1 1   Risk for fall due to : No Fall Risks No Fall Risks History of fall(s);Impaired balance/gait;Impaired mobility;Impaired vision;Orthopedic patient History of fall(s);Impaired balance/gait;Impaired mobility;Impaired vision;Orthopedic patient History of fall(s);Impaired balance/gait;Impaired mobility;Impaired vision;Orthopedic patient  Follow up Falls prevention discussed Falls evaluation completed Falls evaluation completed Falls evaluation completed Falls evaluation completed    MEDICARE RISK AT HOME: Medicare Risk at Home Any stairs in or around the home?: No If so, are there any without handrails?: No Home free of loose throw rugs in walkways, pet beds, electrical cords, etc?: Yes Adequate lighting in your home to reduce risk of falls?: Yes Life alert?: Yes Use of a cane, walker or w/c?:  Yes Grab bars in the bathroom?: Yes Shower chair or bench in shower?: Yes Elevated toilet seat or a handicapped toilet?: Yes  TIMED UP AND GO:  Was the test performed?  No    Cognitive Function: Patient has current diagnosis of cognitive impairment. Patient is followed by neurology for ongoing assessment.      08/02/2016   11:35 AM  MMSE - Mini Mental State Exam  Orientation to time 5  Orientation to Place 5  Registration 3  Attention/ Calculation 5  Recall 2  Language- name 2 objects 2  Language- repeat 1  Language- follow 3 step command 3  Language- read & follow direction 1  Write a sentence 1  Copy design 1  Total score 29  04/03/2022    3:42 PM  Montreal Cognitive Assessment   Visuospatial/ Executive (0/5) 0  Naming (0/3) 2  Attention: Read list of digits (0/2) 2  Attention: Read list of letters (0/1) 0  Attention: Serial 7 subtraction starting at 100 (0/3) 1  Language: Repeat phrase (0/2) 1  Language : Fluency (0/1) 1  Abstraction (0/2) 2  Delayed Recall (0/5) 0  Orientation (0/6) 5  Total 14      09/26/2022    1:56 PM  6CIT Screen  What Year? 0 points  What month? 0 points  What time? 0 points  Count back from 20 0 points  Months in reverse 0 points  Repeat phrase 4 points  Total Score 4 points    Immunizations Immunization History  Administered Date(s) Administered   Fluad Quad(high Dose 65+) 09/17/2018, 09/24/2019, 09/22/2020, 09/29/2021   H1N1 04/20/2008   Influenza Split 09/13/2011   Influenza Whole 10/12/2007, 11/18/2008, 09/15/2009, 09/08/2010   Influenza, High Dose Seasonal PF 09/08/2015, 10/10/2016, 09/11/2017   Influenza,inj,Quad PF,6+ Mos 09/16/2012, 08/26/2013, 08/31/2014   Influenza,inj,quad, With Preservative 09/25/2018   Influenza-Unspecified 09/12/2016   MMR 04/21/2010   PFIZER(Purple Top)SARS-COV-2 Vaccination 01/30/2019, 02/19/2019, 10/16/2019   Pneumococcal Conjugate-13 02/04/2013   Pneumococcal Polysaccharide-23  02/21/2006, 08/31/2014   Pneumococcal-Unspecified 08/23/2014   Td 02/21/2006   Tdap 04/11/2016   Zoster Recombinant(Shingrix) 08/22/2016, 12/05/2016   Zoster, Live 08/21/2005    TDAP status: Up to date  Flu Vaccine status: Due, Education has been provided regarding the importance of this vaccine. Advised may receive this vaccine at local pharmacy or Health Dept. Aware to provide a copy of the vaccination record if obtained from local pharmacy or Health Dept. Verbalized acceptance and understanding.  Pneumococcal vaccine status: Up to date  Covid-19 vaccine status: Information provided on how to obtain vaccines.   Qualifies for Shingles Vaccine? Yes   Zostavax completed Yes   Shingrix Completed?: Yes  Screening Tests Health Maintenance  Topic Date Due   COVID-19 Vaccine (4 - 2023-24 season) 09/09/2022   INFLUENZA VACCINE  04/08/2023 (Originally 08/09/2022)   Medicare Annual Wellness (AWV)  09/26/2023   DTaP/Tdap/Td (3 - Td or Tdap) 04/12/2026   Pneumonia Vaccine 71+ Years old  Completed   Zoster Vaccines- Shingrix  Completed   HPV VACCINES  Aged Out    Health Maintenance  Health Maintenance Due  Topic Date Due   COVID-19 Vaccine (4 - 2023-24 season) 09/09/2022    Colorectal cancer screening: No longer required.   Lung Cancer Screening: (Low Dose CT Chest recommended if Age 16-80 years, 20 pack-year currently smoking OR have quit w/in 15years.) does not qualify.   Lung Cancer Screening Referral: no  Additional Screening:  Hepatitis C Screening: does not qualify; Completed no  Vision Screening: Recommended annual ophthalmology exams for early detection of glaucoma and other disorders of the eye. Is the patient up to date with their annual eye exam?  Yes  Who is the provider or what is the name of the office in which the patient attends annual eye exams? Dr. Allena Katz (every 6 months) If pt is not established with a provider, would they like to be referred to a provider to  establish care? No .   Dental Screening: Recommended annual dental exams for proper oral hygiene  Diabetic Foot Exam: N/A  Community Resource Referral / Chronic Care Management: CRR required this visit?  No   CCM required this visit?  No     Plan:     I have  personally reviewed and noted the following in the patient's chart:   Medical and social history Use of alcohol, tobacco or illicit drugs  Current medications and supplements including opioid prescriptions. Patient is not currently taking opioid prescriptions. Functional ability and status Nutritional status Physical activity Advanced directives List of other physicians Hospitalizations, surgeries, and ER visits in previous 12 months Vitals Screenings to include cognitive, depression, and falls Referrals and appointments  In addition, I have reviewed and discussed with patient certain preventive protocols, quality metrics, and best practice recommendations. A written personalized care plan for preventive services as well as general preventive health recommendations were provided to patient.     Mickeal Needy, LPN   04/10/4740   After Visit Summary: (Mail) Due to this being a telephonic visit, the after visit summary with patients personalized plan was offered to patient via mail   Nurse Notes: Patient has current diagnosis of cognitive impairment. Patient is followed by neurology for ongoing assessment.

## 2022-09-27 ENCOUNTER — Encounter: Payer: Self-pay | Admitting: Physical Therapy

## 2022-09-27 ENCOUNTER — Ambulatory Visit: Payer: Medicare Other | Admitting: Physical Therapy

## 2022-09-27 DIAGNOSIS — R2689 Other abnormalities of gait and mobility: Secondary | ICD-10-CM

## 2022-09-27 DIAGNOSIS — R2681 Unsteadiness on feet: Secondary | ICD-10-CM

## 2022-09-27 DIAGNOSIS — M6281 Muscle weakness (generalized): Secondary | ICD-10-CM

## 2022-09-27 NOTE — Therapy (Signed)
OUTPATIENT PHYSICAL THERAPY NEURO TREATMENT   Patient Name: [name] MRN: [id number] DOB:12-08-1926, 89 y.o., male Today's Date: 09/27/2022   PCP: Pincus Sanes, MD  REFERRING PROVIDER: Pincus Sanes, MD   END OF SESSION:  PT End of Session - 09/27/22 1450     Visit Number 7    Number of Visits 13    Date for PT Re-Evaluation 10/26/22    Authorization Type Medicare/Tricare    Progress Note Due on Visit 10    PT Start Time 1407    PT Stop Time 1445    PT Time Calculation (min) 38 min    Equipment Utilized During Treatment Gait belt    Activity Tolerance Patient tolerated treatment well    Behavior During Therapy WFL for tasks assessed/performed                   Past Medical History:  Diagnosis Date   Anemia    AV block, 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 S/P stenting to mid RCA, prox PDA 06/1999. b. NSTEMI/CABG x 3 in 10/2011 with LIMA to LAD, SVG to PDA, and SVG to OM1.    Cephalalgia 07/14/2015   CHB (complete heart block) 05/03/2012   Overview:  Status post complete heart block heart rate 28 bpm, alternating with 2 to one AV block and drug for bradycardia.  Status post pacemaker implantation.status post Medtronic pacemaker implantation the 03/28/2012   Chronic diastolic CHF (congestive heart failure) 02/05/2018   Chronic gouty arthritis    Chronic UTI    a. Followed by Dr. [name] Caprice - colonized/asymptomatic - not on abx   CKD (chronic kidney disease)    stage 3, GFR 30-59 ml/min; stable with a creatinine around 1.9-2.0 followed by nephrology.   Constipation 03/01/2015   Symptoms and exam consistent with constipation. Abdominal exam is benign with no evidence of pain or obstruction. Discussed importance of increasing fiber and water intake coupled with physical activity to assist with bowel movements. Continue over-the-counter medication management as needed.  Follow-up if symptoms worsen or fail to improve.   Coronary atherosclerosis 11/27/2011   Overview:  Multivessel coronary artery disease recently diagnosed by catheterization 2013 History of stent placement with a heparin-coated stent 2001  Last Assessment & Plan:  Status post coronary bypass grafting.  No recurrent chest pain. Overview:  Overview:  S/P stenting to mid RCA, prox PDA 06/1999;  06/2007 Myoview: negative except for apical thinning, EF 68%, last Myoview August 10, 2008 with n   Degeneration of lumbar intervertebral disc    Degenerative disc disease, cervical, with radiculopathy 09/21/2008   Diastolic dysfunction 07/04/2017   Grade 1 DD on Echo 06/2017   Dyslipidemia    Dysphagia 02/06/2016   3/18 - DG Esophagus:  1. Mild esophageal dysmotility, likely presbyesophagus. 2. No other explanation for patient's symptoms. 3. Small hiatal hernia.  On swallow eval - evidence of cervical spine disease and that was likely contributing   Epistaxis 12/08/2020   Essential hypertension 07/20/2006   GERD (gastroesophageal reflux disease)    Gout 05/12/2018   Gouty arthritis of right great toe 09/03/2017   Left toe Injected January 31, 2018    Hyperpigmentation 11/04/2017   Internal hemorrhoids 08/15/2016   Left inguinal hernia 07/01/2019   Lumbar post-laminectomy syndrome 12/12/2017   Moderate aortic regurgitation 07/04/2017   Echo 06/2017:  EF 55-60%, mild LVH, grade 1 DD,  mild AS, mod AR, mild MR, mild-mod TR   Neck injury    a. 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  This was  switched to by mouth amiodarone at the time of the thoracic surgery appointment was discontinued.   Presence of aortocoronary bypass graft 10/24/2011   Overview:  Performed at [facility] 2013 Last Assessment & Plan:  No complication post coronary bypass grafting.   Prostate cancer (HCC)    a. 2001 s/p TURP.   Pulmonary nodule    a. felt to be noncancerous.  Status post followup CT scan 4 mm and stable.   Renal artery stenosis    a. 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 Excell Seltzer & Dr. 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   Trochanteric bursitis of hip, bilateral 01/10/2017   UGIB (upper gastrointestinal bleed) 02/01/2018   EGD  02/06/18 - mod, non-erosive gastritis   Venous insufficiency of leg 06/05/2010   Vitamin B12 deficiency 08/23/2009   Jan '14  July '14 B12 level  >1500    472   Weakness of both lower extremities 04/09/2020   Past Surgical History:  Procedure Laterality Date   ANTERIOR CHAMBER WASHOUT Left 10/04/2018   Procedure: Anterior Chamber Washout, Vitreous Tap;  Surgeon: Carmela Rima, MD;  Location: [facility] OR;  Service: Ophthalmology;  Laterality: Left;   cardia catherization  07/07/1999   CARDIAC CATHETERIZATION     CARDIAC SURGERY  10/18/2012   open heart surgery   CATARACT EXTRACTION W/ INTRAOCULAR LENS  IMPLANT, BILATERAL  3/205, 06/2013   mccuen   COLONOSCOPY  04/12/2007   CORONARY ARTERY BYPASS GRAFT  10/19/2011   Procedure: CORONARY ARTERY BYPASS GRAFTING (CABG);  Surgeon: Alleen Borne, MD;  Location: [facility] OR;  Service: Open Heart Surgery;  Laterality: N/A;  times three using Left Internal Mammary Artery and Right Greater Saphenouse Vein Graft harvested Endoscopically   edg  07/17/1994    FLEXIBLE SIGMOIDOSCOPY  11/03/1997   GAS INSERTION Left 10/04/2018   Procedure: Insertion Of Gas;  Surgeon: Carmela Rima, MD;  Location: [facility] OR;  Service: Ophthalmology;  Laterality: Left;   GAS/FLUID EXCHANGE Left 10/04/2018   Procedure: Gas/Fluid Exchange;  Surgeon: Carmela Rima, MD;  Location: [facility] OR;  Service: Ophthalmology;  Laterality: Left;   INTRAOPERATIVE TRANSTHORACIC ECHOCARDIOGRAM N/A 02/06/2022   Procedure: INTRAOPERATIVE TRANSTHORACIC ECHOCARDIOGRAM;  Surgeon: Tonny Bollman, MD;  Location: [facility] INVASIVE CV LAB;  Service: Open Heart Surgery;  Laterality: N/A;   LEFT HEART CATHETERIZATION WITH CORONARY ANGIOGRAM N/A 10/16/2011   Procedure: LEFT HEART CATHETERIZATION WITH CORONARY ANGIOGRAM;  Surgeon: Peter M Swaziland, MD;  Location: [facility] CATH LAB;  Service: Cardiovascular;  Laterality: N/A;   LEFT HEART CATHETERIZATION WITH CORONARY/GRAFT ANGIOGRAM N/A 03/11/2013   Procedure: LEFT HEART CATHETERIZATION WITH [name] Caprice;  Surgeon: Micheline Chapman, MD;  Location: [facility] CATH LAB;  Service: Cardiovascular;  Laterality: N/A;   lumbar spinal disk and neck fusion surgery     PACEMAKER INSERTION  03/28/2012   PPM implanted for mobitz II AV block   PARS PLANA VITRECTOMY Left 10/04/2018   Procedure: PARS PLANA VITRECTOMY 25 GAUGE FOR ENDOPHTHALMITIS WITH INJECTION OF INTRAVITREAL ANTIBIOTIC;  Surgeon: Carmela Rima, MD;  Location: [facility] OR;  Service: Ophthalmology;  Laterality: Left;   peripheral vascular catherization  11/24/2003   PERMANENT PACEMAKER INSERTION N/A 03/28/2012   Procedure: PERMANENT PACEMAKER INSERTION;  Surgeon: Hillis Range, MD;  Location: [facility] CATH LAB;  Service: Cardiovascular;  Laterality: N/A;   PROSTATECTOMY     renal circulation  10/01/2003   RIGHT HEART CATH N/A 03/02/2022   Procedure: RIGHT HEART CATH;  Surgeon: Runell Gess, MD;  Location: [facility] INVASIVE CV LAB;  Service: Cardiovascular;  Laterality: N/A;   s/p ptca     stents     X 2   stress  cardiolite  05/04/2005   spring 09-negative except for apical thinning, EF 68%   TRANSCATHETER AORTIC VALVE REPLACEMENT, TRANSFEMORAL Right 02/06/2022   Procedure: Transcatheter Aortic Valve Replacement, Transfemoral;  Surgeon: Tonny Bollman, MD;  Location: [facility] INVASIVE CV LAB;  Service: Open Heart Surgery;  Laterality: Right;   Patient Active Problem List   Diagnosis Date Noted   Dizziness 08/13/2022   Generalized weakness 08/13/2022   Community acquired pneumonia 06/07/2022   Dementia with psychotic disturbance (HCC) 05/22/2022   Gait abnormality 05/22/2022   Candida infection 03/09/2022   Erythema 03/09/2022   DNR (do not resuscitate)/DNI(Do Not Intubate) 02/28/2022   Acute renal failure superimposed on stage 4 chronic kidney disease (HCC) 02/28/2022   Elevated LFTs 02/28/2022   Acute urinary retention 02/28/2022   Pleural effusion on right 02/28/2022   Hallucinations 02/27/2022   Anemia of chronic disease 02/21/2022   (HFpEF) heart failure with preserved ejection fraction (HCC) 02/21/2022   Thrombocytopenia (HCC) 02/21/2022   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   History of cervical discectomy 09/21/2021   Polyarthralgia    Chronic gouty arthritis    Bilateral plantar fasciitis 10/25/2020   Weakness of both lower extremities 04/09/2020   S/P inguinal hernia repair 08/13/2019   Left inguinal hernia 07/01/2019   Lumbar post-laminectomy syndrome 12/12/2017   Spinal stenosis of lumbar region 10/09/2017   Frequent urination 09/03/2017   Gouty arthritis of right great toe 09/03/2017   Anasarca 06/29/2017   Trochanteric bursitis of hip, bilateral 01/10/2017   Internal hemorrhoids 08/15/2016   Cephalalgia 07/14/2015   Cough 02/01/2015   Postoperative atrial fibrillation 05/03/2012   Pacemaker 04/10/2012   AV block, 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 03/26/2012   Presence of aortocoronary bypass graft 10/24/2011   Pulmonary nodule    CAD (coronary  artery disease)    CKD (chronic kidney disease) stage 4, GFR 15-29 ml/min (HCC)    Venous insufficiency of leg 06/05/2010   Insomnia 11/09/2009   Pain in joint, shoulder region 10/14/2009   Vitamin B12 deficiency 08/23/2009   Peripheral neuropathy 03/21/2009   Degenerative disc disease, cervical, with radiculopathy 09/21/2008   Vertigo 08/29/2007   Dyslipidemia 01/17/2007   Essential hypertension 07/20/2006   Prostate cancer 07/20/2006    ONSET DATE: 08/13/2022 (MD referral)  REFERRING DIAG:  G62.89 (ICD-10-CM) - Other polyneuropathy  R42 (ICD-10-CM) - Dizziness  R53.1 (ICD-10-CM) - Generalized weakness    THERAPY DIAG:  Unsteadiness on feet  Other abnormalities of gait and mobility  Muscle weakness (generalized)  Rationale for Evaluation  and Treatment: Rehabilitation  SUBJECTIVE:                                                                                                                                                                                             SUBJECTIVE STATEMENT: Wife and I are celebrating 49th anniversary today.  Had some pain in my feet last night and this morning.  Had some dizzy spells while washing up.  Pt accompanied by: self  PERTINENT HISTORY: chronic low BP, low stamina, reports of dizziness-see complete PMH above; hx of valve replacement in January, per pt report; hx of vertigo  PAIN:  Are you having pain? Yes: NPRS scale: 5/10 Pain location: B LEs Pain description: tightness Aggravating factors: unsure Relieving factors: unsure  PRECAUTIONS: Fall decreased vision due to macular degeneration  RED FLAGS: None   WEIGHT BEARING RESTRICTIONS: No  FALLS: Has patient fallen in last 6 months? No  LIVING ENVIRONMENT: Lives with: lives with their spouse Lives in: House/apartment Stairs: 2 steps to enter home Has following equipment at home: Single point cane  PLOF: Independent with household mobility with device and Independent with  community mobility with device.  Goes (occasionally) to Club at [employer] and works on some [employer] and stretches  PATIENT GOALS: Goals for therapy would be to help with pain/balance  OBJECTIVE:    TODAY'S TREATMENT: 09/25/2022 Activity Comments  Vitals 141/88, HR 71   FTSTS:  19.5 sec with no UE support (Improved as at eval pt had to use BUE support)  TUG:  14.78 sec with SPC Improved from >16 sec  Review of HEP Pt return demo understanding  Forward/back step and weightshift x 5 reps   Forward/back walking along parallel bars UE support  Sidestepping R and L, along parallel bars UE support  Gait x 2 minutes with SPC 250 ft with supervision, carrying cane  Standing march in place 2 x 10   Standing heel/toe raises    PATIENT EDUCATION: Education details: Progress towards goals, POC (planning to decrease to 1x/wk); reviewed pt's plans to return to Club for gym exercise Person educated: Patient Education method: Explanation Education comprehension: verbalized understanding          Access Code: [email or web address] URL: [email or web address] Date: 09/05/2022 Prepared by: [facility] - Outpatient  Rehab - [facility]  Exercises - Sit to Stand  - 1 x daily - 5 x weekly - 2 sets - 10 reps - Forward Step Up  - 1 x daily - 5 x weekly - 2 sets - 10 reps - Backward Walking with Counter Support  - 1 x daily - 5  x weekly - 2 sets - 10 reps - Seated Hamstring Stretch  - 1-2 x daily - 7 x weekly - 1 sets - 3 reps - 30 sec hold - Seated Gastroc Stretch with Strap  - 1-2 x daily - 7 x weekly - 1 sets - 3 reps - 30 sec hold - Standing Gastroc Stretch at Counter  - 1-2 x daily - 7 x weekly - 1 sets - 3 reps - 30 sec hold     --------------------------------------------------------------------- Below measures were taken at time of initial evaluation unless otherwise specified:   DIAGNOSTIC FINDINGS: NA for this episode  COGNITION: Overall cognitive status: Within  functional limits for tasks assessed   SENSATION: Light touch: WFL and reports electric sensations at times   POSTURE: rounded shoulders and forward head  LOWER EXTREMITY ROM:   Active ROM WFL for BLEs in sitting, except some tightness noted in bilat hamstrings   LOWER EXTREMITY MMT:    MMT Right Eval Left Eval  Hip flexion 4+ 4+  Hip extension    Hip abduction    Hip adduction    Hip internal rotation    Hip external rotation    Knee flexion 4+ 4+  Knee extension 4+ 4+  Ankle dorsiflexion 4 4  Ankle plantarflexion    Ankle inversion    Ankle eversion    (Blank rows = not tested)  TRANSFERS: Assistive device utilized:  BUE support   Sit to stand: Modified independence and SBA Stand to sit: Modified independence and SBA  GAIT: Gait pattern:  decreased push off with gait, step through pattern, decreased stride length, and narrow BOS Distance walked: 200 ft Assistive device utilized: Single point cane, pt tends to carry cane for short distances, and None Level of assistance: SBA   FUNCTIONAL TESTS:  5 times sit to stand: 13.28 with BUE support; unable to perform without UE support Timed up and go (TUG): 16.65 sec (no device) 2 minute walk test: 190 ft (O2 94%, HR 78 bpm); stops x 2 10 meter walk test: 13.6 sec = 2.41 ft/sec Berg Balance Scale: 34/56 (Scores <45/56 indicate increased fall risk)   TODAY'S TREATMENT:                                                                                                                              DATE: 08/29/2022    PATIENT EDUCATION: Education details: PT eval results, POC Person educated: Patient Education method: Explanation Education comprehension: verbalized understanding  HOME EXERCISE PROGRAM: Not yet initiated  GOALS: Goals reviewed with patient? Yes  SHORT TERM GOALS: Target date: 09/28/2022  Pt will be independent with HEP for improved balance, strength. Baseline: Goal status: MET 09/27/2022  2.  Pt  will improve 5x sit<>stand to less than or equal to 15 sec without UE support to demonstrate improved functional strength and transfer efficiency. Baseline: 13.28 sec with BUE support: 19.5 sec with no UE support Goal status: IN  PROGRESS 09/27/2022  3.  Pt will improve TUG score to less than or equal to 13.5 sec for decreased fall risk. Baseline: 16.65 sec>14.78 sec  Goal status:PARTIALLY MET, 09/27/2022   LONG TERM GOALS: Target date: 10/26/2022  Pt will be independent with HEP for improved balance, strength, gait. Baseline:  Goal status: IN PROGRESS  2.  Pt will improve Berg score to at least 42/56 to decrease fall risk. Baseline: 34/56 Goal status: IN PROGRESS  3.  Pt will ambulate at least 250 ft in 2 MWT for improved gait efficiency and endurance. Baseline: 180 ft Goal status: IN PROGRESS  4.  Pt will verbalize plans for continued community fitness upon d/c from PT to maximize gains made in PT. Baseline:  Goal status: IN PROGRESS   ASSESSMENT:  CLINICAL IMPRESSION: Assessed STGs this visit, with pt meeting STG 1 and partially meeting STG 2 for improved TUG score.  Pt has improved functional strength, as he is able to perform sit<>stand without UE support today, compared to having to use definite UE support for sit to stand at eval.  He continues to report some pain in lower legs, but he is able to perform standing and seated exercises well, with only brief breaks today.  He reports he and wife are planning to return to [employer] center next week, and he will go to 1x/wk frequency for PT starting next week.  He will continue to benefit from skilled PT towards LTGs for improved functional mobility and balance. OBJECTIVE IMPAIRMENTS: Abnormal gait, decreased balance, decreased endurance, decreased mobility, difficulty walking, and decreased strength.   ACTIVITY LIMITATIONS: standing, transfers, and locomotion level  PARTICIPATION LIMITATIONS: shopping, community activity,  and return to gym  PERSONAL FACTORS: 3+ comorbidities: PMH-see above  are also affecting patient's functional outcome.   REHAB POTENTIAL: Good  CLINICAL DECISION MAKING: Evolving/moderate complexity  EVALUATION COMPLEXITY: Moderate  PLAN:  PT FREQUENCY:  2x/wk for 4 weeks, then 1x/wk for 4 weeks  PT DURATION: 8 weeks  PLANNED INTERVENTIONS: Therapeutic exercises, Therapeutic activity, Neuromuscular re-education, Balance training, Gait training, Patient/Family education, Self Care, Vestibular training, and DME instructions  PLAN FOR NEXT SESSION: Continue to progress HEP for BLE strengthening/balance, continue/progress his walking at home  Lonia Blood, PT 09/27/22 2:50 PM Phone: [phone] Fax: [phone]  [facility] at [facility] [address] [place], [place] [address] Phone # [phone] Fax # [phone]

## 2022-09-28 ENCOUNTER — Ambulatory Visit (INDEPENDENT_AMBULATORY_CARE_PROVIDER_SITE_OTHER): Payer: Medicare Other

## 2022-09-28 DIAGNOSIS — E538 Deficiency of other specified B group vitamins: Secondary | ICD-10-CM | POA: Diagnosis not present

## 2022-09-28 MED ORDER — CYANOCOBALAMIN 1000 MCG/ML IJ SOLN
1000.0000 ug | Freq: Once | INTRAMUSCULAR | Status: AC
Start: 2022-09-28 — End: 2022-09-28
  Administered 2022-09-28: 1000 ug via INTRAMUSCULAR

## 2022-09-28 NOTE — Progress Notes (Signed)
B12 given.  Pt tolerated well. Pt is aware to give the office a call for an side effects or reactions. Please co-sign.

## 2022-10-01 NOTE — Therapy (Signed)
OUTPATIENT PHYSICAL THERAPY NEURO TREATMENT   Patient Name: [name] MRN: [id number] DOB:[birth year], 89 y.o., male Today's Date: 10/02/2022   PCP: Pincus Sanes, MD  REFERRING PROVIDER: Pincus Sanes, MD   END OF SESSION:  PT End of Session - 10/02/22 1442     Visit Number 8    Number of Visits 13    Date for PT Re-Evaluation 10/26/22    Authorization Type Medicare/Tricare    Progress Note Due on Visit 10    PT Start Time 1401    PT Stop Time 1441    PT Time Calculation (min) 40 min    Equipment Utilized During Treatment Gait belt    Activity Tolerance Patient tolerated treatment well    Behavior During Therapy [facility] for tasks assessed/performed                    Past Medical History:  Diagnosis Date   Anemia    AV block, 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 S/P stenting to mid RCA, prox PDA 06/1999. b. NSTEMI/CABG x 3 in 10/2011 with LIMA to LAD, SVG to PDA, and SVG to OM1.    Cephalalgia 07/14/2015   CHB (complete heart block) 05/03/2012   Overview:  Status post complete heart block heart rate 28 bpm, alternating with 2 to one AV block and drug for bradycardia.  Status post pacemaker implantation.status post Medtronic pacemaker implantation the 03/28/2012   Chronic diastolic CHF (congestive heart failure) 02/05/2018   Chronic gouty arthritis    Chronic UTI    a. Followed by Dr. [name] Caprice - colonized/asymptomatic - not on abx   CKD (chronic kidney disease)    stage 3, GFR 30-59 ml/min; stable with a creatinine around 1.9-2.0 followed by nephrology.   Constipation 03/01/2015   Symptoms and exam consistent with constipation. Abdominal exam is benign with no evidence of pain or obstruction. Discussed importance of increasing fiber and water intake coupled with physical activity to assist with bowel movements. Continue over-the-counter medication management as needed.  Follow-up if symptoms worsen or fail to improve.   Coronary atherosclerosis 11/27/2011   Overview:  Multivessel coronary artery disease recently diagnosed by catheterization 2013 History of stent placement with a heparin-coated stent 2001  Last Assessment & Plan:  Status post coronary bypass grafting.  No recurrent chest pain. Overview:  Overview:  S/P stenting to mid RCA, prox PDA 06/1999;  06/2007 Myoview: negative except for apical thinning, EF 68%, last Myoview August 10, 2008 with n   Degeneration of lumbar intervertebral disc    Degenerative disc disease, cervical, with radiculopathy 09/21/2008   Diastolic dysfunction 07/04/2017   Grade 1 DD on Echo 06/2017   Dyslipidemia    Dysphagia 02/06/2016   3/18 - DG Esophagus:  1. Mild esophageal dysmotility, likely presbyesophagus. 2. No other explanation for patient's symptoms. 3. Small hiatal hernia.  On swallow eval - evidence of cervical spine disease and that was likely contributing   Epistaxis 12/08/2020   Essential hypertension 07/20/2006   GERD (gastroesophageal reflux disease)    Gout 05/12/2018   Gouty arthritis of right great toe 09/03/2017   Left toe Injected January 31, 2018    Hyperpigmentation 11/04/2017   Internal hemorrhoids 08/15/2016   Left inguinal hernia 07/01/2019   Lumbar post-laminectomy syndrome 12/12/2017   Moderate aortic regurgitation 07/04/2017   Echo 06/2017:  EF 55-60%, mild LVH, grade 1  DD, mild AS, mod AR, mild MR, mild-mod TR   Neck injury    a. 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  This was  switched to by mouth amiodarone at the time of the thoracic surgery appointment was discontinued.   Presence of aortocoronary bypass graft 10/24/2011   Overview:  Performed at [facility] 2013 Last Assessment & Plan:  No complication post coronary bypass grafting.   Prostate cancer (HCC)    a. 2001 s/p TURP.   Pulmonary nodule    a. felt to be noncancerous.  Status post followup CT scan 4 mm and stable.   Renal artery stenosis    a. 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 Excell Seltzer & Dr. 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   Trochanteric bursitis of hip, bilateral 01/10/2017   UGIB (upper gastrointestinal bleed) 02/01/2018   EGD  02/06/18 - mod, non-erosive gastritis   Venous insufficiency of leg 06/05/2010   Vitamin B12 deficiency 08/23/2009   Jan '14  July '14 B12 level  >1500    472   Weakness of both lower extremities 04/09/2020   Past Surgical History:  Procedure Laterality Date   ANTERIOR CHAMBER WASHOUT Left 10/04/2018   Procedure: Anterior Chamber Washout, Vitreous Tap;  Surgeon: Carmela Rima, MD;  Location: [facility] OR;  Service: Ophthalmology;  Laterality: Left;   cardia catherization  07/07/1999   CARDIAC CATHETERIZATION     CARDIAC SURGERY  10/18/2012   open heart surgery   CATARACT EXTRACTION W/ INTRAOCULAR LENS  IMPLANT, BILATERAL  3/205, 06/2013   mccuen   COLONOSCOPY  04/12/2007   CORONARY ARTERY BYPASS GRAFT  10/19/2011   Procedure: CORONARY ARTERY BYPASS GRAFTING (CABG);  Surgeon: Alleen Borne, MD;  Location: [facility] OR;  Service: Open Heart Surgery;  Laterality: N/A;  times three using Left Internal Mammary Artery and Right Greater Saphenouse Vein Graft harvested Endoscopically   edg  07/17/1994    FLEXIBLE SIGMOIDOSCOPY  11/03/1997   GAS INSERTION Left 10/04/2018   Procedure: Insertion Of Gas;  Surgeon: Carmela Rima, MD;  Location[facility] OR;  Service: Ophthalmology;  Laterality: Left;   GAS/FLUID EXCHANGE Left 10/04/2018   Procedure: Gas/Fluid Exchange;  Surgeon: Carmela Rima, MD;  Location: [facility] OR;  Service: Ophthalmology;  Laterality: Left;   INTRAOPERATIVE TRANSTHORACIC ECHOCARDIOGRAM N/A 02/06/2022   Procedure: INTRAOPERATIVE TRANSTHORACIC ECHOCARDIOGRAM;  Surgeon: Tonny Bollman, MD;  Location: [facility] INVASIVE CV LAB;  Service: Open Heart Surgery;  Laterality: N/A;   LEFT HEART CATHETERIZATION WITH CORONARY ANGIOGRAM N/A 10/16/2011   Procedure: LEFT HEART CATHETERIZATION WITH CORONARY ANGIOGRAM;  Surgeon: Peter M Swaziland, MD;  Location: [facility] CATH LAB;  Service: Cardiovascular;  Laterality: N/A;   LEFT HEART CATHETERIZATION WITH CORONARY/GRAFT ANGIOGRAM N/A 03/11/2013   Procedure: LEFT HEART CATHETERIZATION WITH [name] Caprice;  Surgeon: Micheline Chapman, MD;  Location: [facility] CATH  LAB;  Service: Cardiovascular;  Laterality: N/A;   lumbar spinal disk and neck fusion surgery     PACEMAKER INSERTION  03/28/2012   PPM implanted for mobitz II AV block   PARS PLANA VITRECTOMY Left 10/04/2018   Procedure: PARS PLANA VITRECTOMY 25 GAUGE FOR ENDOPHTHALMITIS WITH INJECTION OF INTRAVITREAL ANTIBIOTIC;  Surgeon: Carmela Rima, MD;  Location: [facility] OR;  Service: Ophthalmology;  Laterality: Left;   peripheral vascular catherization  11/24/2003   PERMANENT PACEMAKER INSERTION N/A 03/28/2012   Procedure: PERMANENT PACEMAKER INSERTION;  Surgeon: Hillis Range, MD;  Location: [facility] CATH LAB;  Service: Cardiovascular;  Laterality: N/A;   PROSTATECTOMY     renal circulation  10/01/2003   RIGHT HEART CATH N/A 03/02/2022   Procedure: RIGHT HEART CATH;  Surgeon: Runell Gess, MD;  Location: [facility] INVASIVE CV LAB;  Service: Cardiovascular;  Laterality: N/A;   s/p ptca     stents     X 2   stress  cardiolite  05/04/2005   spring 09-negative except for apical thinning, EF 68%   TRANSCATHETER AORTIC VALVE REPLACEMENT, TRANSFEMORAL Right 02/06/2022   Procedure: Transcatheter Aortic Valve Replacement, Transfemoral;  Surgeon: Tonny Bollman, MD;  Location: [facility] INVASIVE CV LAB;  Service: Open Heart Surgery;  Laterality: Right;   Patient Active Problem List   Diagnosis Date Noted   Dizziness 08/13/2022   Generalized weakness 08/13/2022   Community acquired pneumonia 06/07/2022   Dementia with psychotic disturbance (HCC) 05/22/2022   Gait abnormality 05/22/2022   Candida infection 03/09/2022   Erythema 03/09/2022   DNR (do not resuscitate)/DNI(Do Not Intubate) 02/28/2022   Acute renal failure superimposed on stage 4 chronic kidney disease (HCC) 02/28/2022   Elevated LFTs 02/28/2022   Acute urinary retention 02/28/2022   Pleural effusion on right 02/28/2022   Hallucinations 02/27/2022   Anemia of chronic disease 02/21/2022   (HFpEF) heart failure with preserved ejection fraction (HCC) 02/21/2022   Thrombocytopenia (HCC) 02/21/2022   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   History of cervical discectomy 09/21/2021   Polyarthralgia    Chronic gouty arthritis    Bilateral plantar fasciitis 10/25/2020   Weakness of both lower extremities 04/09/2020   S/P inguinal hernia repair 08/13/2019   Left inguinal hernia 07/01/2019   Lumbar post-laminectomy syndrome 12/12/2017   Spinal stenosis of lumbar region 10/09/2017   Frequent urination 09/03/2017   Gouty arthritis of right great toe 09/03/2017   Anasarca 06/29/2017   Trochanteric bursitis of hip, bilateral 01/10/2017   Internal hemorrhoids 08/15/2016   Cephalalgia 07/14/2015   Cough 02/01/2015   Postoperative atrial fibrillation 05/03/2012   Pacemaker 04/10/2012   AV block, 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 03/26/2012   Presence of aortocoronary bypass graft 10/24/2011   Pulmonary nodule    CAD (coronary  artery disease)    CKD (chronic kidney disease) stage 4, GFR 15-29 ml/min (HCC)    Venous insufficiency of leg 06/05/2010   Insomnia 11/09/2009   Pain in joint, shoulder region 10/14/2009   Vitamin B12 deficiency 08/23/2009   Peripheral neuropathy 03/21/2009   Degenerative disc disease, cervical, with radiculopathy 09/21/2008   Vertigo 08/29/2007   Dyslipidemia 01/17/2007   Essential hypertension 07/20/2006   Prostate cancer 07/20/2006    ONSET DATE: 08/13/2022 (MD referral)  REFERRING DIAG:  G62.89 (ICD-10-CM) - Other polyneuropathy  R42 (ICD-10-CM) - Dizziness  R53.1 (ICD-10-CM) - Generalized weakness    THERAPY DIAG:  Unsteadiness on feet  Other abnormalities of gait and mobility  Muscle weakness (generalized)  Difficulty  in walking, not elsewhere classified  Rationale for Evaluation and Treatment: Rehabilitation  SUBJECTIVE:                                                                                                                                                                                             SUBJECTIVE STATEMENT: Reports that he was not able to get out of his pajamas on Saturday-Monday d/t LE pain.   Pt accompanied by: self  PERTINENT HISTORY: chronic low BP, low stamina, reports of dizziness-see complete PMH above; hx of valve replacement in January, per pt report; hx of vertigo  PAIN:  Are you having pain? Yes: NPRS scale: 6/10 Pain location: B LEs Pain description: tightness Aggravating factors: "comes unexpected" Relieving factors: "the cream"  PRECAUTIONS: Fall decreased vision due to macular degeneration  RED FLAGS: None   WEIGHT BEARING RESTRICTIONS: No  FALLS: Has patient fallen in last 6 months? No  LIVING ENVIRONMENT: Lives with: lives with their spouse Lives in: House/apartment Stairs: 2 steps to enter home Has following equipment at home: Single point cane  PLOF: Independent with household mobility with device and  Independent with community mobility with device.  Goes (occasionally) to [employer] at [employer] and works on some [employer] and stretches  PATIENT GOALS: Goals for therapy would be to help with pain/balance  OBJECTIVE:    TODAY'S TREATMENT: 10/02/22 Activity Comments  Nustep for endurance L5 x 6 min UEs/LEs  Maintaining ~72 SPM  STS + green medball 10x Posterior lean on 1st rep; fatigued by 7th rep  STS + EC 2x5 Cueing to scoot forward to EOM; CGA for safety  Educated on/practice use of "rolling pin" massage on B thighs and plantar tennis ball massage  Pt noted TTP over L lateral thigh, however no palpable trigger points evident   B conc, single ecc heel raises 5 reps each, 10x heel raises Cues to maintain heels up during wt shift   Standing on incline/decline EO/EC 30" each Good stability     PATIENT EDUCATION: Education details: advised pt to speak to his PCP about LE pain as he reports that he is not getting this treated Person educated: Patient Education method: Explanation Education comprehension: verbalized understanding   Access Code: [email or web address] URL: [email or web address] Date: 09/05/2022 Prepared by: [facility] - Outpatient  Rehab - [facility]  Exercises - Sit to Stand  - 1 x daily - 5 x weekly - 2 sets - 10 reps - Forward Step Up  - 1 x daily - 5 x weekly - 2 sets - 10 reps - Backward Walking with Counter Support  - 1 x  daily - 5 x weekly - 2 sets - 10 reps - Seated Hamstring Stretch  - 1-2 x daily - 7 x weekly - 1 sets - 3 reps - 30 sec hold - Seated Gastroc Stretch with Strap  - 1-2 x daily - 7 x weekly - 1 sets - 3 reps - 30 sec hold - Standing Gastroc Stretch at Counter  - 1-2 x daily - 7 x weekly - 1 sets - 3 reps - 30 sec hold     --------------------------------------------------------------------- Below measures were taken at time of initial evaluation unless otherwise specified:   DIAGNOSTIC FINDINGS: NA for this  episode  COGNITION: Overall cognitive status: Within functional limits for tasks assessed   SENSATION: Light touch: WFL and reports electric sensations at times   POSTURE: rounded shoulders and forward head  LOWER EXTREMITY ROM:   Active ROM WFL for BLEs in sitting, except some tightness noted in bilat hamstrings   LOWER EXTREMITY MMT:    MMT Right Eval Left Eval  Hip flexion 4+ 4+  Hip extension    Hip abduction    Hip adduction    Hip internal rotation    Hip external rotation    Knee flexion 4+ 4+  Knee extension 4+ 4+  Ankle dorsiflexion 4 4  Ankle plantarflexion    Ankle inversion    Ankle eversion    (Blank rows = not tested)  TRANSFERS: Assistive device utilized:  BUE support   Sit to stand: Modified independence and SBA Stand to sit: Modified independence and SBA  GAIT: Gait pattern:  decreased push off with gait, step through pattern, decreased stride length, and narrow BOS Distance walked: 200 ft Assistive device utilized: Single point cane, pt tends to carry cane for short distances, and None Level of assistance: SBA   FUNCTIONAL TESTS:  5 times sit to stand: 13.28 with BUE support; unable to perform without UE support Timed up and go (TUG): 16.65 sec (no device) 2 minute walk test: 190 ft (O2 94%, HR 78 bpm); stops x 2 10 meter walk test: 13.6 sec = 2.41 ft/sec Berg Balance Scale: 34/56 (Scores <45/56 indicate increased fall risk)   TODAY'S TREATMENT:                                                                                                                              DATE: 08/29/2022    PATIENT EDUCATION: Education details: PT eval results, POC Person educated: Patient Education method: Explanation Education comprehension: verbalized understanding  HOME EXERCISE PROGRAM: Not yet initiated  GOALS: Goals reviewed with patient? Yes  SHORT TERM GOALS: Target date: 09/28/2022  Pt will be independent with HEP for improved balance,  strength. Baseline: Goal status: MET 09/27/2022  2.  Pt will improve 5x sit<>stand to less than or equal to 15 sec without UE support to demonstrate improved functional strength and transfer efficiency. Baseline: 13.28 sec with BUE support: 19.5 sec with no UE support  Goal status: IN PROGRESS 09/27/2022  3.  Pt will improve TUG score to less than or equal to 13.5 sec for decreased fall risk. Baseline: 16.65 sec>14.78 sec  Goal status:PARTIALLY MET, 09/27/2022   LONG TERM GOALS: Target date: 10/26/2022  Pt will be independent with HEP for improved balance, strength, gait. Baseline:  Goal status: IN PROGRESS  2.  Pt will improve Berg score to at least 42/56 to decrease fall risk. Baseline: 34/56 Goal status: IN PROGRESS  3.  Pt will ambulate at least 250 ft in 2 MWT for improved gait efficiency and endurance. Baseline: 180 ft Goal status: IN PROGRESS  4.  Pt will verbalize plans for continued community fitness upon d/c from PT to maximize gains made in PT. Baseline:  Goal status: IN PROGRESS   ASSESSMENT:  CLINICAL IMPRESSION: Patient arrived to session with report of severe LE pain over the weekend, noting inability to get dressed d/t severity. Patient agreeable to participate in session despite pain today. Educated on use of self-massage technique as pt does report benefit from massage. STS transfers required cues for positioning. Patient was limited by muscle fatigue with STS transfers, requiring rest break before proceeding. Patient reported pain no worse at end of session.  OBJECTIVE IMPAIRMENTS: Abnormal gait, decreased balance, decreased endurance, decreased mobility, difficulty walking, and decreased strength.   ACTIVITY LIMITATIONS: standing, transfers, and locomotion level  PARTICIPATION LIMITATIONS: shopping, community activity, and return to gym  PERSONAL FACTORS: 3+ comorbidities: PMH-see above  are also affecting patient's functional outcome.   REHAB POTENTIAL:  Good  CLINICAL DECISION MAKING: Evolving/moderate complexity  EVALUATION COMPLEXITY: Moderate  PLAN:  PT FREQUENCY:  2x/wk for 4 weeks, then 1x/wk for 4 weeks  PT DURATION: 8 weeks  PLANNED INTERVENTIONS: Therapeutic exercises, Therapeutic activity, Neuromuscular re-education, Balance training, Gait training, Patient/Family education, Self Care, Vestibular training, and DME instructions  PLAN FOR NEXT SESSION: Continue to progress HEP for BLE strengthening/balance, continue/progress his walking at home   Anette Guarneri, PT, DPT 10/02/22 2:44 PM  [facility] [address], [address] [place], [place] [address] Phone # [phone] Fax # [phone]

## 2022-10-02 ENCOUNTER — Encounter: Payer: Self-pay | Admitting: Physical Therapy

## 2022-10-02 ENCOUNTER — Ambulatory Visit: Payer: Medicare Other | Admitting: Physical Therapy

## 2022-10-02 DIAGNOSIS — R262 Difficulty in walking, not elsewhere classified: Secondary | ICD-10-CM

## 2022-10-02 DIAGNOSIS — R2681 Unsteadiness on feet: Secondary | ICD-10-CM

## 2022-10-02 DIAGNOSIS — M6281 Muscle weakness (generalized): Secondary | ICD-10-CM

## 2022-10-02 DIAGNOSIS — R2689 Other abnormalities of gait and mobility: Secondary | ICD-10-CM

## 2022-10-03 ENCOUNTER — Ambulatory Visit: Payer: Medicare Other

## 2022-10-03 DIAGNOSIS — Z23 Encounter for immunization: Secondary | ICD-10-CM

## 2022-10-03 NOTE — Progress Notes (Signed)
[name] presented in the office today for his HD Flu shot. I did notice the allergy on his medication list and confirmed with him that he was in fact here for his HD Flu Vaccine and he confirmed that he was. He also made It very clear that the itching allergic reaction that he had to a flu vaccine was a very long time and he's been fine for years. I did have him sign the Vaccine sheet just to have in documentation that he agreed to receiving this vaccine today. HD Flu vaccine was administered into his Left Deltoid Muscle, he tolerated the injection well and the injection site looked normal. [name] was advised to report to the office if he noticed any adverse reactions. Signed vaccine form will be sent to HIM to be scanned into his chart.

## 2022-10-09 ENCOUNTER — Ambulatory Visit: Payer: Medicare Other

## 2022-10-09 DIAGNOSIS — I441 Atrioventricular block, second degree: Secondary | ICD-10-CM | POA: Diagnosis not present

## 2022-10-10 ENCOUNTER — Ambulatory Visit (HOSPITAL_COMMUNITY)
Admission: RE | Admit: 2022-10-10 | Discharge: 2022-10-10 | Disposition: A | Discharge status: Home / Self Care | Payer: Medicare Other | Source: Ambulatory Visit | Attending: Nephrology | Admitting: Nephrology

## 2022-10-10 VITALS — BP 84/56 | HR 79 | Temp 97.3°F | Resp 17

## 2022-10-10 DIAGNOSIS — N184 Chronic kidney disease, stage 4 (severe): Secondary | ICD-10-CM | POA: Diagnosis present

## 2022-10-10 LAB — CUP PACEART REMOTE DEVICE CHECK
Battery Impedance: 1798 Ohm
Battery Remaining Longevity: 38 mo
Battery Voltage: 2.75 V
Brady Statistic AP VP Percent: 10 %
Brady Statistic AP VS Percent: 82 %
Brady Statistic AS VP Percent: 0 %
Brady Statistic AS VS Percent: 8 %
Date Time Interrogation Session: 20241001091827
Implantable Lead Connection Status: 753985
Implantable Lead Connection Status: 753985
Implantable Lead Implant Date: 20140321
Implantable Lead Implant Date: 20140321
Implantable Lead Location: 753859
Implantable Lead Location: 753860
Implantable Lead Model: 5076
Implantable Lead Model: 5092
Implantable Pulse Generator Implant Date: 20140321
Lead Channel Impedance Value: 414 Ohm
Lead Channel Impedance Value: 751 Ohm
Lead Channel Pacing Threshold Amplitude: 0.625 V
Lead Channel Pacing Threshold Amplitude: 0.875 V
Lead Channel Pacing Threshold Pulse Width: 0.4 ms
Lead Channel Pacing Threshold Pulse Width: 0.4 ms
Lead Channel Setting Pacing Amplitude: 2 V
Lead Channel Setting Pacing Amplitude: 2.5 V
Lead Channel Setting Pacing Pulse Width: 0.4 ms
Lead Channel Setting Sensing Sensitivity: 5.6 mV
Zone Setting Status: 755011
Zone Setting Status: 755011

## 2022-10-10 LAB — RENAL FUNCTION PANEL
Albumin: 3.6 g/dL (ref 3.5–5.0)
Anion gap: 9 (ref 5–15)
BUN: 57 mg/dL — ABNORMAL HIGH (ref 8–23)
CO2: 23 mmol/L (ref 22–32)
Calcium: 9.6 mg/dL (ref 8.9–10.3)
Chloride: 106 mmol/L (ref 98–111)
Creatinine, Ser: 2.58 mg/dL — ABNORMAL HIGH (ref 0.61–1.24)
GFR, Estimated: 22 mL/min — ABNORMAL LOW (ref >60)
Glucose, Bld: 109 mg/dL — ABNORMAL HIGH (ref 70–99)
Phosphorus: 3.4 mg/dL (ref 2.5–4.6)
Potassium: 4 mmol/L (ref 3.5–5.1)
Sodium: 138 mmol/L (ref 135–145)

## 2022-10-10 LAB — POCT HEMOGLOBIN-HEMACUE: Hemoglobin: 11.6 g/dL — ABNORMAL LOW (ref 13.0–17.0)

## 2022-10-10 LAB — IRON AND TIBC
Iron: 48 ug/dL (ref 45–182)
Saturation Ratios: 18 % (ref 17.9–39.5)
TIBC: 263 ug/dL (ref 250–450)
UIBC: 215 ug/dL

## 2022-10-10 MED ORDER — EPOETIN ALFA-EPBX 10000 UNIT/ML IJ SOLN
20000.0000 [IU] | INTRAMUSCULAR | Status: DC
  Administered 2022-10-10: 20000 [IU] via SUBCUTANEOUS

## 2022-10-10 MED ORDER — EPOETIN ALFA-EPBX 10000 UNIT/ML IJ SOLN
INTRAMUSCULAR | Status: AC
  Filled 2022-10-10: qty 2

## 2022-10-15 NOTE — Therapy (Signed)
OUTPATIENT PHYSICAL THERAPY NEURO TREATMENT   Patient Name: PRAMOD HARDIMAN MRN: [id number] DOB:03/01/1926, 89 y.o., male Today's Date: 10/16/2022   PCP: Pincus Sanes, MD  REFERRING PROVIDER: Pincus Sanes, MD   END OF SESSION:  PT End of Session - 10/16/22 1447     Visit Number 9    Number of Visits 13    Date for PT Re-Evaluation 10/26/22    Authorization Type Medicare/Tricare    Progress Note Due on Visit 10    PT Start Time 1401    PT Stop Time 1444    PT Time Calculation (min) 43 min    Equipment Utilized During Treatment Gait belt    Activity Tolerance Patient tolerated treatment well    Behavior During Therapy WFL for tasks assessed/performed                     Past Medical History:  Diagnosis Date   Anemia    AV block, 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 S/P stenting to mid RCA, prox PDA 06/1999. b. NSTEMI/CABG x 3 in 10/2011 with LIMA to LAD, SVG to PDA, and SVG to OM1.    Cephalalgia 07/14/2015   CHB (complete heart block) 05/03/2012   Overview:  Status post complete heart block heart rate 28 bpm, alternating with 2 to one AV block and drug for bradycardia.  Status post pacemaker implantation.status post Medtronic pacemaker implantation the 03/28/2012   Chronic diastolic CHF (congestive heart failure) 02/05/2018   Chronic gouty arthritis    Chronic UTI    a. Followed by Dr. [name] Caprice - colonized/asymptomatic - not on abx   CKD (chronic kidney disease)    stage 3, GFR 30-59 ml/min; stable with a creatinine around 1.9-2.0 followed by nephrology.   Constipation 03/01/2015   Symptoms and exam consistent with constipation. Abdominal exam is benign with no evidence of pain or obstruction. Discussed importance of increasing fiber and water intake coupled with physical activity to assist with bowel movements. Continue over-the-counter medication management as needed.  Follow-up if symptoms worsen or fail to improve.   Coronary atherosclerosis 11/27/2011   Overview:  Multivessel coronary artery disease recently diagnosed by catheterization 2013 History of stent placement with a heparin-coated stent 2001  Last Assessment & Plan:  Status post coronary bypass grafting.  No recurrent chest pain. Overview:  Overview:  S/P stenting to mid RCA, prox PDA 06/1999;  06/2007 Myoview: negative except for apical thinning, EF 68%, last Myoview August 10, 2008 with n   Degeneration of lumbar intervertebral disc    Degenerative disc disease, cervical, with radiculopathy 09/21/2008   Diastolic dysfunction 07/04/2017   Grade 1 DD on Echo 06/2017   Dyslipidemia    Dysphagia 02/06/2016   3/18 - DG Esophagus:  1. Mild esophageal dysmotility, likely presbyesophagus. 2. No other explanation for patient's symptoms. 3. Small hiatal hernia.  On swallow eval - evidence of cervical spine disease and that was likely contributing   Epistaxis 12/08/2020   Essential hypertension 07/20/2006   GERD (gastroesophageal reflux disease)    Gout 05/12/2018   Gouty arthritis of right great toe 09/03/2017   Left toe Injected January 31, 2018    Hyperpigmentation 11/04/2017   Internal hemorrhoids 08/15/2016   Left inguinal hernia 07/01/2019   Lumbar post-laminectomy syndrome 12/12/2017   Moderate aortic regurgitation 07/04/2017   Echo 06/2017:  EF 55-60%, mild LVH, grade  1 DD, mild AS, mod AR, mild MR, mild-mod TR   Neck injury    a. 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  This was  switched to by mouth amiodarone at the time of the thoracic surgery appointment was discontinued.   Presence of aortocoronary bypass graft 10/24/2011   Overview:  Performed at [facility] 2013 Last Assessment & Plan:  No complication post coronary bypass grafting.   Prostate cancer (HCC)    a. 2001 s/p TURP.   Pulmonary nodule    a. felt to be noncancerous.  Status post followup CT scan 4 mm and stable.   Renal artery stenosis    a. 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 Excell Seltzer & Dr. 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   Trochanteric bursitis of hip, bilateral 01/10/2017   UGIB (upper gastrointestinal bleed) 02/01/2018   EGD  02/06/18 - mod, non-erosive gastritis   Venous insufficiency of leg 06/05/2010   Vitamin B12 deficiency 08/23/2009   Jan '14  July '14 B12 level  >1500    472   Weakness of both lower extremities 04/09/2020   Past Surgical History:  Procedure Laterality Date   ANTERIOR CHAMBER WASHOUT Left 10/04/2018   Procedure: Anterior Chamber Washout, Vitreous Tap;  Surgeon: Carmela Rima, MD;  Location: [facility] OR;  Service: Ophthalmology;  Laterality: Left;   cardia catherization  07/07/1999   CARDIAC CATHETERIZATION     CARDIAC SURGERY  10/18/2012   open heart surgery   CATARACT EXTRACTION W/ INTRAOCULAR LENS  IMPLANT, BILATERAL  3/205, 06/2013   mccuen   COLONOSCOPY  04/12/2007   CORONARY ARTERY BYPASS GRAFT  10/19/2011   Procedure: CORONARY ARTERY BYPASS GRAFTING (CABG);  Surgeon: Alleen Borne, MD;  Location: [facility] OR;  Service: Open Heart Surgery;  Laterality: N/A;  times three using Left Internal Mammary Artery and Right Greater Saphenouse Vein Graft harvested Endoscopically   edg  07/17/1994    FLEXIBLE SIGMOIDOSCOPY  11/03/1997   GAS INSERTION Left 10/04/2018   Procedure: Insertion Of Gas;  Surgeon: Carmela Rima, MD;  Location: [facility] OR;  Service: Ophthalmology;  Laterality: Left;   GAS/FLUID EXCHANGE Left 10/04/2018   Procedure: Gas/Fluid Exchange;  Surgeon: Carmela Rima, MD;  Location: [facility] OR;  Service: Ophthalmology;  Laterality: Left;   INTRAOPERATIVE TRANSTHORACIC ECHOCARDIOGRAM N/A 02/06/2022   Procedure: INTRAOPERATIVE TRANSTHORACIC ECHOCARDIOGRAM;  Surgeon: Tonny Bollman, MD;  Location: [facility] INVASIVE CV LAB;  Service: Open Heart Surgery;  Laterality: N/A;   LEFT HEART CATHETERIZATION WITH CORONARY ANGIOGRAM N/A 10/16/2011   Procedure: LEFT HEART CATHETERIZATION WITH CORONARY ANGIOGRAM;  Surgeon: Peter M Swaziland, MD;  Location: [facility] CATH LAB;  Service: Cardiovascular;  Laterality: N/A;   LEFT HEART CATHETERIZATION WITH CORONARY/GRAFT ANGIOGRAM N/A 03/11/2013   Procedure: LEFT HEART CATHETERIZATION WITH [name] Caprice;  Surgeon: Micheline Chapman, MD;  Location: [facility]  CATH LAB;  Service: Cardiovascular;  Laterality: N/A;   lumbar spinal disk and neck fusion surgery     PACEMAKER INSERTION  03/28/2012   PPM implanted for mobitz II AV block   PARS PLANA VITRECTOMY Left 10/04/2018   Procedure: PARS PLANA VITRECTOMY 25 GAUGE FOR ENDOPHTHALMITIS WITH INJECTION OF INTRAVITREAL ANTIBIOTIC;  Surgeon: Carmela Rima, MD;  Location: [facility] OR;  Service: Ophthalmology;  Laterality: Left;   peripheral vascular catherization  11/24/2003   PERMANENT PACEMAKER INSERTION N/A 03/28/2012   Procedure: PERMANENT PACEMAKER INSERTION;  Surgeon: Hillis Range, MD;  Location: [facility] CATH LAB;  Service: Cardiovascular;  Laterality: N/A;   PROSTATECTOMY     renal circulation  10/01/2003   RIGHT HEART CATH N/A 03/02/2022   Procedure: RIGHT HEART CATH;  Surgeon: Runell Gess, MD;  Location: [facility] INVASIVE CV LAB;  Service: Cardiovascular;  Laterality: N/A;   s/p ptca     stents     X 2   stress  cardiolite  05/04/2005   spring 09-negative except for apical thinning, EF 68%   TRANSCATHETER AORTIC VALVE REPLACEMENT, TRANSFEMORAL Right 02/06/2022   Procedure: Transcatheter Aortic Valve Replacement, Transfemoral;  Surgeon: Tonny Bollman, MD;  Location: [facility] INVASIVE CV LAB;  Service: Open Heart Surgery;  Laterality: Right;   Patient Active Problem List   Diagnosis Date Noted   Dizziness 08/13/2022   Generalized weakness 08/13/2022   Community acquired pneumonia 06/07/2022   Dementia with psychotic disturbance (HCC) 05/22/2022   Gait abnormality 05/22/2022   Candida infection 03/09/2022   Erythema 03/09/2022   DNR (do not resuscitate)/DNI(Do Not Intubate) 02/28/2022   Acute renal failure superimposed on stage 4 chronic kidney disease (HCC) 02/28/2022   Elevated LFTs 02/28/2022   Acute urinary retention 02/28/2022   Pleural effusion on right 02/28/2022   Hallucinations 02/27/2022   Anemia of chronic disease 02/21/2022   (HFpEF) heart failure with preserved ejection fraction (HCC) 02/21/2022   Thrombocytopenia (HCC) 02/21/2022   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   History of cervical discectomy 09/21/2021   Polyarthralgia    Chronic gouty arthritis    Bilateral plantar fasciitis 10/25/2020   Weakness of both lower extremities 04/09/2020   S/P inguinal hernia repair 08/13/2019   Left inguinal hernia 07/01/2019   Lumbar post-laminectomy syndrome 12/12/2017   Spinal stenosis of lumbar region 10/09/2017   Frequent urination 09/03/2017   Gouty arthritis of right great toe 09/03/2017   Anasarca 06/29/2017   Trochanteric bursitis of hip, bilateral 01/10/2017   Internal hemorrhoids 08/15/2016   Cephalalgia 07/14/2015   Cough 02/01/2015   Postoperative atrial fibrillation 05/03/2012   Pacemaker 04/10/2012   AV block, 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 03/26/2012   Presence of aortocoronary bypass graft 10/24/2011   Pulmonary nodule    CAD (coronary  artery disease)    CKD (chronic kidney disease) stage 4, GFR 15-29 ml/min (HCC)    Venous insufficiency of leg 06/05/2010   Insomnia 11/09/2009   Pain in joint, shoulder region 10/14/2009   Vitamin B12 deficiency 08/23/2009   Peripheral neuropathy 03/21/2009   Degenerative disc disease, cervical, with radiculopathy 09/21/2008   Vertigo 08/29/2007   Dyslipidemia 01/17/2007   Essential hypertension 07/20/2006   Prostate cancer 07/20/2006    ONSET DATE: 08/13/2022 (MD referral)  REFERRING DIAG:  G62.89 (ICD-10-CM) - Other polyneuropathy  R42 (ICD-10-CM) - Dizziness  R53.1 (ICD-10-CM) - Generalized weakness    THERAPY DIAG:  Unsteadiness on feet  Other abnormalities of gait and mobility  Muscle weakness (generalized)  Difficulty in walking, not elsewhere classified  Rationale for Evaluation and Treatment: Rehabilitation  SUBJECTIVE:                                                                                                                                                                                             SUBJECTIVE STATEMENT: Have not been doing good. Sunday and Monday were not good. Felt dizzy Sunday most of the day. Denies dizziness currently but "got a funny feeling over the front of my head and I don't know what it is." Denies known hx of migraines. Reports that when he got a hemoglobin shot on Monday, his BP was 80/53 mmHg. Reports that he has has problems with his vision "going out of focus" for the past 10 days but denies diplopia, dizziness, dysphagia.   Pt accompanied by: self  PERTINENT HISTORY: chronic low BP, low stamina, reports of dizziness-see complete PMH above; hx of valve replacement in January, per pt report; hx of vertigo  PAIN:  Are you having pain? Yes: NPRS scale: 7/10 Pain location: B feet Pain description: tightness Aggravating factors: "comes unexpected" Relieving factors: "the cream"  PRECAUTIONS: Fall decreased vision due to macular  degeneration  RED FLAGS: None   WEIGHT BEARING RESTRICTIONS: No  FALLS: Has patient fallen in last 6 months? No  LIVING ENVIRONMENT: Lives with: lives with their spouse Lives in: House/apartment Stairs: 2 steps to enter home Has following equipment at home: Single point cane  PLOF: Independent with household mobility with device and Independent with community mobility with device.  Goes (occasionally) to [employer] at [employer] and works on some [employer] and stretches  PATIENT GOALS: Goals for therapy would be to help with pain/balance  OBJECTIVE:     TODAY'S TREATMENT: 10/16/22 Activity Comments  vitals 103/62 mmHg, 76 bpm, 98% spO2  Vitals retest after water break 150/87 mmHg, 72bpm  Vitals recheck  150/82 mmHg, 72bpm  Nustep L3 x 6 min  Maintaining ~105 SPM  standing on  foam beam  Posterior LOB, requiring cues for hip strategy   Runner's stretch 30" each   rockerboard ant/pos and R/L static and dynamic balance Posterior LOB, requiring cues for hip and ankle strategy to recover  vitals 106/70 mmHg, 79 bpm              PATIENT EDUCATION: Education details: Educated pt on hypotension management including proper hydration, ankle pumps, compression stockings, edu on POC with probably DC next session- pt agreeable  Person educated: Patient Education method: Explanation Education comprehension: verbalized understanding    Access Code: [email or web address] URL: [email or web address] Date: 09/05/2022 Prepared by: [facility] -  Outpatient  Rehab - [facility]  Exercises - Sit to Stand  - 1 x daily - 5 x weekly - 2 sets - 10 reps - Forward Step Up  - 1 x daily - 5 x weekly - 2 sets - 10 reps - Backward Walking with Counter Support  - 1 x daily - 5 x weekly - 2 sets - 10 reps - Seated Hamstring Stretch  - 1-2 x daily - 7 x weekly - 1 sets - 3 reps - 30 sec hold - Seated Gastroc Stretch with Strap  - 1-2 x daily - 7 x weekly - 1 sets - 3 reps - 30 sec hold -  Standing Gastroc Stretch at Counter  - 1-2 x daily - 7 x weekly - 1 sets - 3 reps - 30 sec hold     --------------------------------------------------------------------- Below measures were taken at time of initial evaluation unless otherwise specified:   DIAGNOSTIC FINDINGS: NA for this episode  COGNITION: Overall cognitive status: Within functional limits for tasks assessed   SENSATION: Light touch: WFL and reports electric sensations at times   POSTURE: rounded shoulders and forward head  LOWER EXTREMITY ROM:   Active ROM WFL for BLEs in sitting, except some tightness noted in bilat hamstrings   LOWER EXTREMITY MMT:    MMT Right Eval Left Eval  Hip flexion 4+ 4+  Hip extension    Hip abduction    Hip adduction    Hip internal rotation    Hip external rotation    Knee flexion 4+ 4+  Knee extension 4+ 4+  Ankle dorsiflexion 4 4  Ankle plantarflexion    Ankle inversion    Ankle eversion    (Blank rows = not tested)  TRANSFERS: Assistive device utilized:  BUE support   Sit to stand: Modified independence and SBA Stand to sit: Modified independence and SBA  GAIT: Gait pattern:  decreased push off with gait, step through pattern, decreased stride length, and narrow BOS Distance walked: 200 ft Assistive device utilized: Single point cane, pt tends to carry cane for short distances, and None Level of assistance: SBA   FUNCTIONAL TESTS:  5 times sit to stand: 13.28 with BUE support; unable to perform without UE support Timed up and go (TUG): 16.65 sec (no device) 2 minute walk test: 190 ft (O2 94%, HR 78 bpm); stops x 2 10 meter walk test: 13.6 sec = 2.41 ft/sec Berg Balance Scale: 34/56 (Scores <45/56 indicate increased fall risk)   TODAY'S TREATMENT:                                                                                                                              DATE: 08/29/2022    PATIENT EDUCATION: Education details: PT eval results,  POC Person educated: Patient Education method: Explanation Education comprehension: verbalized understanding  HOME EXERCISE PROGRAM: Not yet initiated  GOALS: Goals reviewed with patient? Yes  SHORT TERM GOALS: Target  date: 09/28/2022  Pt will be independent with HEP for improved balance, strength. Baseline: Goal status: MET 09/27/2022  2.  Pt will improve 5x sit<>stand to less than or equal to 15 sec without UE support to demonstrate improved functional strength and transfer efficiency. Baseline: 13.28 sec with BUE support: 19.5 sec with no UE support Goal status: IN PROGRESS 09/27/2022  3.  Pt will improve TUG score to less than or equal to 13.5 sec for decreased fall risk. Baseline: 16.65 sec>14.78 sec  Goal status:PARTIALLY MET, 09/27/2022   LONG TERM GOALS: Target date: 10/26/2022  Pt will be independent with HEP for improved balance, strength, gait. Baseline:  Goal status: IN PROGRESS  2.  Pt will improve Berg score to at least 42/56 to decrease fall risk. Baseline: 34/56 Goal status: IN PROGRESS  3.  Pt will ambulate at least 250 ft in 2 MWT for improved gait efficiency and endurance. Baseline: 180 ft Goal status: IN PROGRESS  4.  Pt will verbalize plans for continued community fitness upon d/c from PT to maximize gains made in PT. Baseline:  Goal status: IN PROGRESS   ASSESSMENT:  CLINICAL IMPRESSION: Patient arrived to session with report of dizziness on Sunday. Denies dizziness today however reports "got a funny feeling over the front of my head and I don't know what it is." Denies known hx of migraines. Also reports hypotension yesterday when getting an injection; BP was on the lower side at start of session today. Educated pt on hypotension management including proper hydration, ankle pumps, compression stockings. Vitals recheck after cup of water revealed significant spike in systolic BP, however patient was asymptomatic, thus proceeded with session while  monitoring symptoms. Balance activities incorporated compliant surface and SLS tasks. Limited ability to use ankle and hip strategy to correct posterior LOB. End of session revealed drop in systolic- patient continued to be asymptomatic. Encouraged monitoring of BP at home and call BP if abnormal values continue.   OBJECTIVE IMPAIRMENTS: Abnormal gait, decreased balance, decreased endurance, decreased mobility, difficulty walking, and decreased strength.   ACTIVITY LIMITATIONS: standing, transfers, and locomotion level  PARTICIPATION LIMITATIONS: shopping, community activity, and return to gym  PERSONAL FACTORS: 3+ comorbidities: PMH-see above  are also affecting patient's functional outcome.   REHAB POTENTIAL: Good  CLINICAL DECISION MAKING: Evolving/moderate complexity  EVALUATION COMPLEXITY: Moderate  PLAN:  PT FREQUENCY:  2x/wk for 4 weeks, then 1x/wk for 4 weeks  PT DURATION: 8 weeks  PLANNED INTERVENTIONS: Therapeutic exercises, Therapeutic activity, Neuromuscular re-education, Balance training, Gait training, Patient/Family education, Self Care, Vestibular training, and DME instructions  PLAN FOR NEXT SESSION: Continue to progress HEP for BLE strengthening/balance, continue/progress his walking at home   Anette Guarneri, PT, DPT 10/16/22 2:47 PM  [facility] [address] [place], [place] [address] Phone # [phone] Fax # [phone]

## 2022-10-16 ENCOUNTER — Ambulatory Visit: Payer: Medicare Other | Attending: Internal Medicine | Admitting: Physical Therapy

## 2022-10-16 ENCOUNTER — Encounter: Payer: Self-pay | Admitting: Physical Therapy

## 2022-10-16 DIAGNOSIS — R2689 Other abnormalities of gait and mobility: Secondary | ICD-10-CM | POA: Diagnosis present

## 2022-10-16 DIAGNOSIS — R2681 Unsteadiness on feet: Secondary | ICD-10-CM | POA: Diagnosis present

## 2022-10-16 DIAGNOSIS — M6281 Muscle weakness (generalized): Secondary | ICD-10-CM

## 2022-10-16 DIAGNOSIS — R262 Difficulty in walking, not elsewhere classified: Secondary | ICD-10-CM | POA: Diagnosis present

## 2022-10-23 ENCOUNTER — Telehealth: Payer: Self-pay | Admitting: Cardiovascular Disease

## 2022-10-23 ENCOUNTER — Ambulatory Visit: Payer: Medicare Other | Admitting: Physical Therapy

## 2022-10-23 NOTE — Telephone Encounter (Signed)
STAT if patient feels like he/she is going to faint   1. Are you feeling dizzy, lightheaded, or faint right now? Yes; this morning    2. Have you passed out?  No  (If yes move to .SYNCOPECHMG)   3. Do you have any other symptoms? SOB   4. Have you checked your HR and BP (record if available)? No

## 2022-10-23 NOTE — Telephone Encounter (Signed)
Spoke with patient, states he's been having dizzy spells upon changing positions (rising from bed) but these have seemed to worsen over the past month with no medication changes. Patient is not currently dizzy but afraid this will lead to a fall/syncope. Patient believes his dizziness is due to all of his medication and would like an appointment with Dr Excell Seltzer sooner than December to discuss what medication he can come off of (if any).  Patient blood pressure record are as follows: 119/61 (today), 107/63 (10/19/22), and 114/62 (10/19/22). These were obtained different days when he was experiencing dizziness.  Patient complains of SOB but states this is his baseline and has not worsened, dizziness is his primary complaint today.  Will forward to [facility] nurse to see if we can get him scheduled sooner, will also forward to device team to ensure there isn't any arrhthymias that could be causing this.

## 2022-10-23 NOTE — Telephone Encounter (Signed)
Transmission reviewed and WNL.  Pt is not dependent-underlying AS/VS 55 bpm.  Pt atrial paces 91%.   V paces 11%.

## 2022-10-25 NOTE — Progress Notes (Signed)
Remote pacemaker transmission.   

## 2022-10-29 ENCOUNTER — Ambulatory Visit (INDEPENDENT_AMBULATORY_CARE_PROVIDER_SITE_OTHER): Payer: Medicare Other

## 2022-10-29 ENCOUNTER — Other Ambulatory Visit: Payer: Self-pay | Admitting: Internal Medicine

## 2022-10-29 DIAGNOSIS — E538 Deficiency of other specified B group vitamins: Secondary | ICD-10-CM | POA: Diagnosis not present

## 2022-10-29 MED ORDER — CYANOCOBALAMIN 1000 MCG/ML IJ SOLN
1000.0000 ug | Freq: Once | INTRAMUSCULAR | Status: AC
Start: 2022-10-29 — End: 2022-10-29
  Administered 2022-10-29: 1000 ug via INTRAMUSCULAR

## 2022-10-29 NOTE — Progress Notes (Signed)
Pt was given B12 Injection w/o any complications.

## 2022-10-31 ENCOUNTER — Ambulatory Visit: Payer: Medicare Other | Attending: Cardiovascular Disease | Admitting: Cardiovascular Disease

## 2022-10-31 ENCOUNTER — Encounter: Payer: Self-pay | Admitting: Cardiovascular Disease

## 2022-10-31 VITALS — BP 146/72 | HR 67 | Ht 67.5 in | Wt 147.8 lb

## 2022-10-31 DIAGNOSIS — Z952 Presence of prosthetic heart valve: Secondary | ICD-10-CM | POA: Insufficient documentation

## 2022-10-31 DIAGNOSIS — I10 Essential (primary) hypertension: Secondary | ICD-10-CM | POA: Insufficient documentation

## 2022-10-31 DIAGNOSIS — I959 Hypotension, unspecified: Secondary | ICD-10-CM | POA: Diagnosis present

## 2022-10-31 DIAGNOSIS — I5032 Chronic diastolic (congestive) heart failure: Secondary | ICD-10-CM | POA: Insufficient documentation

## 2022-10-31 MED ORDER — HYDRALAZINE HCL 50 MG PO TABS: 50.0000 mg | ORAL_TABLET | Freq: Three times a day (TID) | ORAL | 3 refills | Status: DC

## 2022-10-31 NOTE — Assessment & Plan Note (Signed)
Controlled.  See below for further discussion.

## 2022-10-31 NOTE — Assessment & Plan Note (Signed)
He will be reassessed in January when he presents for his 1 year TAVR clinical follow-up visit with an echocardiogram.  His exam suggests normal function of his TAVR bioprosthesis.

## 2022-10-31 NOTE — Patient Instructions (Signed)
Medication Instructions:  DECREASE Hydralazine to 50mg  three times daily *If you need a refill on your cardiac medications before your next appointment, please call your pharmacy*  Follow-Up: At [name], you and your health needs are our priority.  As part of our continuing mission to provide you with exceptional heart care, we have created designated Provider Care Teams.  These Care Teams include your primary Cardiologist (physician) and Advanced Practice Providers (APPs -  Physician Assistants and Nurse Practitioners) who all work together to provide you with the care you need, when you need it.  We recommend signing up for the patient portal called "MyChart".  Sign up information is provided on this After Visit Summary.  MyChart is used to connect with patients for Virtual Visits (Telemedicine).  Patients are able to view lab/test results, encounter notes, upcoming appointments, etc.  Non-urgent messages can be sent to your provider as well.   To learn more about what you can do with MyChart, go to [email or web address].    Your next appointment:   As scheduled in Feb, then Dr Excell Seltzer in April 2025  Provider:   Tonny Bollman, MD

## 2022-10-31 NOTE — Progress Notes (Signed)
Place 2 sprays into the nose as needed (dryness).   trolamine salicylate (ASPERCREME) 10 % cream Apply 1 Application topically in the morning and at bedtime. For leg cramps   Vitamin D, Ergocalciferol, (DRISDOL) 1.25 MG (50000 UNIT) CAPS capsule Take 50,000 Units by mouth every 30 (thirty) days.   [DISCONTINUED] hydrALAZINE (APRESOLINE) 100 MG tablet Take 1 tablet (100 mg total) by mouth 3 (three) times daily.   Current Facility-Administered Medications for the 10/31/22 encounter (Office Visit) with Tonny Bollman, MD  Medication    NON FORMULARY 1 application     Allergies:   Aspirin, Lisinopril, Amoxicillin, Atarax [hydroxyzine hcl], Cephalexin, Ciprofloxacin, Clindamycin, Clobetasol, Codeine, Fish allergy, Fluarix [influenza virus vaccine], Haemophilus influenzae, Hydrocodone, Hydrocodone-acetaminophen, Hydroxyzine, Latex, Macrobid [nitrofurantoin macrocrystal], Niacin, Niacin-lovastatin er, Niacin-lovastatin er, Nitrofurantoin, Omeprazole, Other, Tramadol, Vibramycin [doxycycline], Adhesive [tape], Bactrim [sulfamethoxazole-trimethoprim], Colchicine, Gabapentin, and Nortriptyline   ROS:   Please see the history of present illness.    All other systems reviewed and are negative.  EKGs/Labs/Other Studies Reviewed:    The following studies were reviewed today: Cardiac Studies & Procedures   CARDIAC CATHETERIZATION  CARDIAC CATHETERIZATION 03/02/2022  Narrative Images from the original result were not included. [name] is a 89 y.o. male   [id number] LOCATION:  FACILITY: [facility] PHYSICIAN: Nanetta Batty, M.D. 1926-05-15   DATE OF PROCEDURE:  03/02/2022  DATE OF DISCHARGE:     Right heart catheterization    History obtained from chart review.  89 year old married Caucasian male referred for right heart cath by Dr. Eden Emms to determine filling pressures.  He does have a history of severe asymmetric septal hypertrophy, remote pacemaker and TAVR in January of this year.  Echo reveals normal LV function with well-functioning aortic bioprosthesis.  The patient has elevated liver function tests and chronic renal insufficiency.  He was referred for right heart cath to determine his hemodynamics.   HEMODYNAMICS:  1: Right atrial pressure-11/10 2: Right ventricular pressure-21/0 3: Pulmonary artery pressure-44/13, mean 25 4: Pulmonary artery wedge pressure-A-wave 15, V wave 13, mean 10 Cardiac output-8.1 L/min with an index of 4.3 L/min/m.   IMPRESSION:Mr Tillery right heart cath suggested  normal filling pressures.  He has mildly elevated pulmonary artery pressures.  Nanetta Batty. MD, [facility] 03/02/2022 10:14 AM   STRESS TESTS  MYOCARDIAL PERFUSION IMAGING 10/24/2017  Narrative  The left ventricular ejection fraction is normal (55-65%).  Nuclear stress EF: 56%.  Blood pressure demonstrated a hypertensive response to exercise.  There was no ST segment deviation noted during stress.  T wave inversion of 2 mm was noted during stress in the V1 and V2 leads, ending at 4 minutes of stress, and returning to baseline after 1-5 mins of recovery.  Defect 1: There is a large defect of moderate severity.  This is a low risk study.  No significant reversible ischemia. There is a large size, moderate intensity mostly fixed inferior defect, that mildly improves during upright positioning, suggestive of likely scar with a degree of bowel attenuation. There is also a moderate sized and intensity inferolateral perfusion defect which does change with supine or upright positioning, suggestive of scar. LVEF 56% with mild basal inferior hypokinesis. This is an intermediate risk study given the extent of fixed perfusion defects.   ECHOCARDIOGRAM  ECHOCARDIOGRAM COMPLETE 03/14/2022  Narrative ECHOCARDIOGRAM REPORT    Patient Name:   [name] Date of Exam: 03/14/2022 Medical Rec #:  [id number]          Height:  Cardiology Office Note:    Date:  10/31/2022   ID:  [name], DOB 07-04-1926, MRN [id number]  PCP:  Pincus Sanes, MD   [facility] Providers Cardiologist:  Tonny Bollman, MD Electrophysiologist:  Maurice Small, MD     Referring MD: Pincus Sanes, MD   Chief Complaint  Patient presents with   Dizziness    History of Present Illness:    [name] is a 89 y.o. male with a hx of coronary artery disease with remote stenting in 2001 followed by multivessel CABG in 2013 with early failure of his saphenous vein graft conduits. He has had continued patency of the LIMA to LAD graft. 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 He underwent TAVR in January 2024 for treatment of severe aortic stenosis.  He is here with his wife today and he presents for evaluation of postural dizziness.  His blood pressure has been running less than 120/80 mmHg.  The patient has been holding his afternoon dose of hydralazine in the last few days but has not noticed much improvement yet.  He does not have any other new complaints.  He admits to chronic complaints of shortness of breath with exertion and fatigue.  He has had no recent problems with leg swelling, orthopnea, PND, or chest pain.  Current Medications: Current Meds  Medication Sig   acetaminophen (TYLENOL) 500 MG tablet Take 1,000 mg by mouth 3 (three) times daily as needed for moderate pain.   allopurinol (ZYLOPRIM) 100 MG tablet TAKE 1 TABLET DAILY   amoxicillin (AMOXIL) 500 MG capsule Take 2,000 mg by mouth as directed. 1 HOUR PRIOR TO DENTAL CLEANINGS AND PROCEDURES   Aromatic Inhalants (VICKS VAPOR INHALER IN) Place 1 puff into both nostrils as needed (for congestion).   Ascorbic Acid (VITAMIN C) 1000 MG tablet Take 1,000 mg by mouth daily.    aspirin EC 81 MG tablet Take 81 mg by mouth daily.   Calcium Citrate-Vitamin D (CITRACAL + D PO) Take 2 tablets by mouth in the morning and at bedtime.   Carboxymeth-Glycerin-Polysorb (REFRESH OPTIVE MEGA-3 OP) Place 1 drop into both eyes 2 (two) times daily.   carvedilol (COREG) 3.125 MG tablet Take 1 tablet (3.125 mg total) by mouth 2 (two) times daily.   Cholecalciferol 25 MCG (1000 UT) capsule Take 1,000 Units by mouth daily.   cyanocobalamin (,VITAMIN B-12,) 1000 MCG/ML injection Inject 1,000 mcg into the muscle once. Monthly injection   folic acid (FOLVITE) 1 MG tablet Take 1 tablet (1 mg total) by mouth daily. Annual appt due in May must see provider for future refills (Patient taking differently: Take 1 mg by mouth at bedtime. Annual appt due in May must see provider for future refills)   furosemide (LASIX) 40 MG tablet Take 1 tablet by mouth every Monday, Wednesday, and Friday.  Take an extra pill three times per week prn   hydrALAZINE (APRESOLINE) 50 MG tablet Take 1 tablet (50 mg total) by mouth 3 (three) times daily.   loratadine (CLARITIN) 10 MG tablet Take 10 mg by mouth daily as needed (for seasonal allergies).   Multiple Vitamins-Minerals (PRESERVISION AREDS PO) Take by mouth.   nitroGLYCERIN (NITROSTAT) 0.4 MG SL tablet DISSOLVE 1 TABLET UNDER THE TONGUE EVERY 5 MINUTES AS NEEDED FOR CHEST PAIN, MAXIMUM 3 TABLETS   Saline (AYR NASAL MIST ALLERGY/SINUS NA)  Place 2 sprays into the nose as needed (dryness).   trolamine salicylate (ASPERCREME) 10 % cream Apply 1 Application topically in the morning and at bedtime. For leg cramps   Vitamin D, Ergocalciferol, (DRISDOL) 1.25 MG (50000 UNIT) CAPS capsule Take 50,000 Units by mouth every 30 (thirty) days.   [DISCONTINUED] hydrALAZINE (APRESOLINE) 100 MG tablet Take 1 tablet (100 mg total) by mouth 3 (three) times daily.   Current Facility-Administered Medications for the 10/31/22 encounter (Office Visit) with Tonny Bollman, MD  Medication    NON FORMULARY 1 application     Allergies:   Aspirin, Lisinopril, Amoxicillin, Atarax [hydroxyzine hcl], Cephalexin, Ciprofloxacin, Clindamycin, Clobetasol, Codeine, Fish allergy, Fluarix [influenza virus vaccine], Haemophilus influenzae, Hydrocodone, Hydrocodone-acetaminophen, Hydroxyzine, Latex, Macrobid [nitrofurantoin macrocrystal], Niacin, Niacin-lovastatin er, Niacin-lovastatin er, Nitrofurantoin, Omeprazole, Other, Tramadol, Vibramycin [doxycycline], Adhesive [tape], Bactrim [sulfamethoxazole-trimethoprim], Colchicine, Gabapentin, and Nortriptyline   ROS:   Please see the history of present illness.    All other systems reviewed and are negative.  EKGs/Labs/Other Studies Reviewed:    The following studies were reviewed today: Cardiac Studies & Procedures   CARDIAC CATHETERIZATION  CARDIAC CATHETERIZATION 03/02/2022  Narrative Images from the original result were not included. [name] is a 89 y.o. male   [id number] LOCATION:  FACILITY: [facility] PHYSICIAN: Nanetta Batty, M.D. 1926-05-15   DATE OF PROCEDURE:  03/02/2022  DATE OF DISCHARGE:     Right heart catheterization    History obtained from chart review.  89 year old married Caucasian male referred for right heart cath by Dr. Eden Emms to determine filling pressures.  He does have a history of severe asymmetric septal hypertrophy, remote pacemaker and TAVR in January of this year.  Echo reveals normal LV function with well-functioning aortic bioprosthesis.  The patient has elevated liver function tests and chronic renal insufficiency.  He was referred for right heart cath to determine his hemodynamics.   HEMODYNAMICS:  1: Right atrial pressure-11/10 2: Right ventricular pressure-21/0 3: Pulmonary artery pressure-44/13, mean 25 4: Pulmonary artery wedge pressure-A-wave 15, V wave 13, mean 10 Cardiac output-8.1 L/min with an index of 4.3 L/min/m.   IMPRESSION:Mr Tillery right heart cath suggested  normal filling pressures.  He has mildly elevated pulmonary artery pressures.  Nanetta Batty. MD, [facility] 03/02/2022 10:14 AM   STRESS TESTS  MYOCARDIAL PERFUSION IMAGING 10/24/2017  Narrative  The left ventricular ejection fraction is normal (55-65%).  Nuclear stress EF: 56%.  Blood pressure demonstrated a hypertensive response to exercise.  There was no ST segment deviation noted during stress.  T wave inversion of 2 mm was noted during stress in the V1 and V2 leads, ending at 4 minutes of stress, and returning to baseline after 1-5 mins of recovery.  Defect 1: There is a large defect of moderate severity.  This is a low risk study.  No significant reversible ischemia. There is a large size, moderate intensity mostly fixed inferior defect, that mildly improves during upright positioning, suggestive of likely scar with a degree of bowel attenuation. There is also a moderate sized and intensity inferolateral perfusion defect which does change with supine or upright positioning, suggestive of scar. LVEF 56% with mild basal inferior hypokinesis. This is an intermediate risk study given the extent of fixed perfusion defects.   ECHOCARDIOGRAM  ECHOCARDIOGRAM COMPLETE 03/14/2022  Narrative ECHOCARDIOGRAM REPORT    Patient Name:   [name] Date of Exam: 03/14/2022 Medical Rec #:  [id number]          Height:  Cardiology Office Note:    Date:  10/31/2022   ID:  [name], DOB 07-04-1926, MRN [id number]  PCP:  Pincus Sanes, MD   [facility] Providers Cardiologist:  Tonny Bollman, MD Electrophysiologist:  Maurice Small, MD     Referring MD: Pincus Sanes, MD   Chief Complaint  Patient presents with   Dizziness    History of Present Illness:    [name] is a 89 y.o. male with a hx of coronary artery disease with remote stenting in 2001 followed by multivessel CABG in 2013 with early failure of his saphenous vein graft conduits. He has had continued patency of the LIMA to LAD graft. 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 He underwent TAVR in January 2024 for treatment of severe aortic stenosis.  He is here with his wife today and he presents for evaluation of postural dizziness.  His blood pressure has been running less than 120/80 mmHg.  The patient has been holding his afternoon dose of hydralazine in the last few days but has not noticed much improvement yet.  He does not have any other new complaints.  He admits to chronic complaints of shortness of breath with exertion and fatigue.  He has had no recent problems with leg swelling, orthopnea, PND, or chest pain.  Current Medications: Current Meds  Medication Sig   acetaminophen (TYLENOL) 500 MG tablet Take 1,000 mg by mouth 3 (three) times daily as needed for moderate pain.   allopurinol (ZYLOPRIM) 100 MG tablet TAKE 1 TABLET DAILY   amoxicillin (AMOXIL) 500 MG capsule Take 2,000 mg by mouth as directed. 1 HOUR PRIOR TO DENTAL CLEANINGS AND PROCEDURES   Aromatic Inhalants (VICKS VAPOR INHALER IN) Place 1 puff into both nostrils as needed (for congestion).   Ascorbic Acid (VITAMIN C) 1000 MG tablet Take 1,000 mg by mouth daily.    aspirin EC 81 MG tablet Take 81 mg by mouth daily.   Calcium Citrate-Vitamin D (CITRACAL + D PO) Take 2 tablets by mouth in the morning and at bedtime.   Carboxymeth-Glycerin-Polysorb (REFRESH OPTIVE MEGA-3 OP) Place 1 drop into both eyes 2 (two) times daily.   carvedilol (COREG) 3.125 MG tablet Take 1 tablet (3.125 mg total) by mouth 2 (two) times daily.   Cholecalciferol 25 MCG (1000 UT) capsule Take 1,000 Units by mouth daily.   cyanocobalamin (,VITAMIN B-12,) 1000 MCG/ML injection Inject 1,000 mcg into the muscle once. Monthly injection   folic acid (FOLVITE) 1 MG tablet Take 1 tablet (1 mg total) by mouth daily. Annual appt due in May must see provider for future refills (Patient taking differently: Take 1 mg by mouth at bedtime. Annual appt due in May must see provider for future refills)   furosemide (LASIX) 40 MG tablet Take 1 tablet by mouth every Monday, Wednesday, and Friday.  Take an extra pill three times per week prn   hydrALAZINE (APRESOLINE) 50 MG tablet Take 1 tablet (50 mg total) by mouth 3 (three) times daily.   loratadine (CLARITIN) 10 MG tablet Take 10 mg by mouth daily as needed (for seasonal allergies).   Multiple Vitamins-Minerals (PRESERVISION AREDS PO) Take by mouth.   nitroGLYCERIN (NITROSTAT) 0.4 MG SL tablet DISSOLVE 1 TABLET UNDER THE TONGUE EVERY 5 MINUTES AS NEEDED FOR CHEST PAIN, MAXIMUM 3 TABLETS   Saline (AYR NASAL MIST ALLERGY/SINUS NA)  Cardiology Office Note:    Date:  10/31/2022   ID:  [name], DOB 07-04-1926, MRN [id number]  PCP:  Pincus Sanes, MD   [facility] Providers Cardiologist:  Tonny Bollman, MD Electrophysiologist:  Maurice Small, MD     Referring MD: Pincus Sanes, MD   Chief Complaint  Patient presents with   Dizziness    History of Present Illness:    [name] is a 89 y.o. male with a hx of coronary artery disease with remote stenting in 2001 followed by multivessel CABG in 2013 with early failure of his saphenous vein graft conduits. He has had continued patency of the LIMA to LAD graft. 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 He underwent TAVR in January 2024 for treatment of severe aortic stenosis.  He is here with his wife today and he presents for evaluation of postural dizziness.  His blood pressure has been running less than 120/80 mmHg.  The patient has been holding his afternoon dose of hydralazine in the last few days but has not noticed much improvement yet.  He does not have any other new complaints.  He admits to chronic complaints of shortness of breath with exertion and fatigue.  He has had no recent problems with leg swelling, orthopnea, PND, or chest pain.  Current Medications: Current Meds  Medication Sig   acetaminophen (TYLENOL) 500 MG tablet Take 1,000 mg by mouth 3 (three) times daily as needed for moderate pain.   allopurinol (ZYLOPRIM) 100 MG tablet TAKE 1 TABLET DAILY   amoxicillin (AMOXIL) 500 MG capsule Take 2,000 mg by mouth as directed. 1 HOUR PRIOR TO DENTAL CLEANINGS AND PROCEDURES   Aromatic Inhalants (VICKS VAPOR INHALER IN) Place 1 puff into both nostrils as needed (for congestion).   Ascorbic Acid (VITAMIN C) 1000 MG tablet Take 1,000 mg by mouth daily.    aspirin EC 81 MG tablet Take 81 mg by mouth daily.   Calcium Citrate-Vitamin D (CITRACAL + D PO) Take 2 tablets by mouth in the morning and at bedtime.   Carboxymeth-Glycerin-Polysorb (REFRESH OPTIVE MEGA-3 OP) Place 1 drop into both eyes 2 (two) times daily.   carvedilol (COREG) 3.125 MG tablet Take 1 tablet (3.125 mg total) by mouth 2 (two) times daily.   Cholecalciferol 25 MCG (1000 UT) capsule Take 1,000 Units by mouth daily.   cyanocobalamin (,VITAMIN B-12,) 1000 MCG/ML injection Inject 1,000 mcg into the muscle once. Monthly injection   folic acid (FOLVITE) 1 MG tablet Take 1 tablet (1 mg total) by mouth daily. Annual appt due in May must see provider for future refills (Patient taking differently: Take 1 mg by mouth at bedtime. Annual appt due in May must see provider for future refills)   furosemide (LASIX) 40 MG tablet Take 1 tablet by mouth every Monday, Wednesday, and Friday.  Take an extra pill three times per week prn   hydrALAZINE (APRESOLINE) 50 MG tablet Take 1 tablet (50 mg total) by mouth 3 (three) times daily.   loratadine (CLARITIN) 10 MG tablet Take 10 mg by mouth daily as needed (for seasonal allergies).   Multiple Vitamins-Minerals (PRESERVISION AREDS PO) Take by mouth.   nitroGLYCERIN (NITROSTAT) 0.4 MG SL tablet DISSOLVE 1 TABLET UNDER THE TONGUE EVERY 5 MINUTES AS NEEDED FOR CHEST PAIN, MAXIMUM 3 TABLETS   Saline (AYR NASAL MIST ALLERGY/SINUS NA)  Cardiology Office Note:    Date:  10/31/2022   ID:  [name], DOB 07-04-1926, MRN [id number]  PCP:  Pincus Sanes, MD   [facility] Providers Cardiologist:  Tonny Bollman, MD Electrophysiologist:  Maurice Small, MD     Referring MD: Pincus Sanes, MD   Chief Complaint  Patient presents with   Dizziness    History of Present Illness:    [name] is a 89 y.o. male with a hx of coronary artery disease with remote stenting in 2001 followed by multivessel CABG in 2013 with early failure of his saphenous vein graft conduits. He has had continued patency of the LIMA to LAD graft. 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 He underwent TAVR in January 2024 for treatment of severe aortic stenosis.  He is here with his wife today and he presents for evaluation of postural dizziness.  His blood pressure has been running less than 120/80 mmHg.  The patient has been holding his afternoon dose of hydralazine in the last few days but has not noticed much improvement yet.  He does not have any other new complaints.  He admits to chronic complaints of shortness of breath with exertion and fatigue.  He has had no recent problems with leg swelling, orthopnea, PND, or chest pain.  Current Medications: Current Meds  Medication Sig   acetaminophen (TYLENOL) 500 MG tablet Take 1,000 mg by mouth 3 (three) times daily as needed for moderate pain.   allopurinol (ZYLOPRIM) 100 MG tablet TAKE 1 TABLET DAILY   amoxicillin (AMOXIL) 500 MG capsule Take 2,000 mg by mouth as directed. 1 HOUR PRIOR TO DENTAL CLEANINGS AND PROCEDURES   Aromatic Inhalants (VICKS VAPOR INHALER IN) Place 1 puff into both nostrils as needed (for congestion).   Ascorbic Acid (VITAMIN C) 1000 MG tablet Take 1,000 mg by mouth daily.    aspirin EC 81 MG tablet Take 81 mg by mouth daily.   Calcium Citrate-Vitamin D (CITRACAL + D PO) Take 2 tablets by mouth in the morning and at bedtime.   Carboxymeth-Glycerin-Polysorb (REFRESH OPTIVE MEGA-3 OP) Place 1 drop into both eyes 2 (two) times daily.   carvedilol (COREG) 3.125 MG tablet Take 1 tablet (3.125 mg total) by mouth 2 (two) times daily.   Cholecalciferol 25 MCG (1000 UT) capsule Take 1,000 Units by mouth daily.   cyanocobalamin (,VITAMIN B-12,) 1000 MCG/ML injection Inject 1,000 mcg into the muscle once. Monthly injection   folic acid (FOLVITE) 1 MG tablet Take 1 tablet (1 mg total) by mouth daily. Annual appt due in May must see provider for future refills (Patient taking differently: Take 1 mg by mouth at bedtime. Annual appt due in May must see provider for future refills)   furosemide (LASIX) 40 MG tablet Take 1 tablet by mouth every Monday, Wednesday, and Friday.  Take an extra pill three times per week prn   hydrALAZINE (APRESOLINE) 50 MG tablet Take 1 tablet (50 mg total) by mouth 3 (three) times daily.   loratadine (CLARITIN) 10 MG tablet Take 10 mg by mouth daily as needed (for seasonal allergies).   Multiple Vitamins-Minerals (PRESERVISION AREDS PO) Take by mouth.   nitroGLYCERIN (NITROSTAT) 0.4 MG SL tablet DISSOLVE 1 TABLET UNDER THE TONGUE EVERY 5 MINUTES AS NEEDED FOR CHEST PAIN, MAXIMUM 3 TABLETS   Saline (AYR NASAL MIST ALLERGY/SINUS NA)  Cardiology Office Note:    Date:  10/31/2022   ID:  [name], DOB 07-04-1926, MRN [id number]  PCP:  Pincus Sanes, MD   [facility] Providers Cardiologist:  Tonny Bollman, MD Electrophysiologist:  Maurice Small, MD     Referring MD: Pincus Sanes, MD   Chief Complaint  Patient presents with   Dizziness    History of Present Illness:    [name] is a 89 y.o. male with a hx of coronary artery disease with remote stenting in 2001 followed by multivessel CABG in 2013 with early failure of his saphenous vein graft conduits. He has had continued patency of the LIMA to LAD graft. 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 He underwent TAVR in January 2024 for treatment of severe aortic stenosis.  He is here with his wife today and he presents for evaluation of postural dizziness.  His blood pressure has been running less than 120/80 mmHg.  The patient has been holding his afternoon dose of hydralazine in the last few days but has not noticed much improvement yet.  He does not have any other new complaints.  He admits to chronic complaints of shortness of breath with exertion and fatigue.  He has had no recent problems with leg swelling, orthopnea, PND, or chest pain.  Current Medications: Current Meds  Medication Sig   acetaminophen (TYLENOL) 500 MG tablet Take 1,000 mg by mouth 3 (three) times daily as needed for moderate pain.   allopurinol (ZYLOPRIM) 100 MG tablet TAKE 1 TABLET DAILY   amoxicillin (AMOXIL) 500 MG capsule Take 2,000 mg by mouth as directed. 1 HOUR PRIOR TO DENTAL CLEANINGS AND PROCEDURES   Aromatic Inhalants (VICKS VAPOR INHALER IN) Place 1 puff into both nostrils as needed (for congestion).   Ascorbic Acid (VITAMIN C) 1000 MG tablet Take 1,000 mg by mouth daily.    aspirin EC 81 MG tablet Take 81 mg by mouth daily.   Calcium Citrate-Vitamin D (CITRACAL + D PO) Take 2 tablets by mouth in the morning and at bedtime.   Carboxymeth-Glycerin-Polysorb (REFRESH OPTIVE MEGA-3 OP) Place 1 drop into both eyes 2 (two) times daily.   carvedilol (COREG) 3.125 MG tablet Take 1 tablet (3.125 mg total) by mouth 2 (two) times daily.   Cholecalciferol 25 MCG (1000 UT) capsule Take 1,000 Units by mouth daily.   cyanocobalamin (,VITAMIN B-12,) 1000 MCG/ML injection Inject 1,000 mcg into the muscle once. Monthly injection   folic acid (FOLVITE) 1 MG tablet Take 1 tablet (1 mg total) by mouth daily. Annual appt due in May must see provider for future refills (Patient taking differently: Take 1 mg by mouth at bedtime. Annual appt due in May must see provider for future refills)   furosemide (LASIX) 40 MG tablet Take 1 tablet by mouth every Monday, Wednesday, and Friday.  Take an extra pill three times per week prn   hydrALAZINE (APRESOLINE) 50 MG tablet Take 1 tablet (50 mg total) by mouth 3 (three) times daily.   loratadine (CLARITIN) 10 MG tablet Take 10 mg by mouth daily as needed (for seasonal allergies).   Multiple Vitamins-Minerals (PRESERVISION AREDS PO) Take by mouth.   nitroGLYCERIN (NITROSTAT) 0.4 MG SL tablet DISSOLVE 1 TABLET UNDER THE TONGUE EVERY 5 MINUTES AS NEEDED FOR CHEST PAIN, MAXIMUM 3 TABLETS   Saline (AYR NASAL MIST ALLERGY/SINUS NA)

## 2022-11-07 ENCOUNTER — Ambulatory Visit (HOSPITAL_COMMUNITY)
Admission: RE | Admit: 2022-11-07 | Discharge: 2022-11-07 | Disposition: A | Discharge status: Home / Self Care | Payer: Medicare Other | Source: Ambulatory Visit | Attending: Nephrology | Admitting: Nephrology

## 2022-11-07 VITALS — BP 100/56 | HR 79 | Temp 97.3°F | Resp 17

## 2022-11-07 DIAGNOSIS — N184 Chronic kidney disease, stage 4 (severe): Secondary | ICD-10-CM | POA: Insufficient documentation

## 2022-11-07 LAB — RENAL FUNCTION PANEL
Albumin: 3.9 g/dL (ref 3.5–5.0)
Anion gap: 10 (ref 5–15)
BUN: 64 mg/dL — ABNORMAL HIGH (ref 8–23)
CO2: 25 mmol/L (ref 22–32)
Calcium: 10.1 mg/dL (ref 8.9–10.3)
Chloride: 102 mmol/L (ref 98–111)
Creatinine, Ser: 2.36 mg/dL — ABNORMAL HIGH (ref 0.61–1.24)
GFR, Estimated: 25 mL/min — ABNORMAL LOW (ref >60)
Glucose, Bld: 87 mg/dL (ref 70–99)
Phosphorus: 3.1 mg/dL (ref 2.5–4.6)
Potassium: 4.7 mmol/L (ref 3.5–5.1)
Sodium: 137 mmol/L (ref 135–145)

## 2022-11-07 LAB — IRON AND TIBC
Iron: 62 ug/dL (ref 45–182)
Saturation Ratios: 20 % (ref 17.9–39.5)
TIBC: 307 ug/dL (ref 250–450)
UIBC: 245 ug/dL

## 2022-11-07 MED ORDER — EPOETIN ALFA-EPBX 10000 UNIT/ML IJ SOLN
20000.0000 [IU] | INTRAMUSCULAR | Status: DC
  Administered 2022-11-07: 20000 [IU] via SUBCUTANEOUS

## 2022-11-07 MED ORDER — EPOETIN ALFA-EPBX 10000 UNIT/ML IJ SOLN
INTRAMUSCULAR | Status: AC
  Filled 2022-11-07: qty 2

## 2022-11-08 LAB — POCT HEMOGLOBIN-HEMACUE: Hemoglobin: 11.7 g/dL — ABNORMAL LOW (ref 13.0–17.0)

## 2022-11-30 ENCOUNTER — Ambulatory Visit (INDEPENDENT_AMBULATORY_CARE_PROVIDER_SITE_OTHER): Payer: Medicare Other

## 2022-11-30 ENCOUNTER — Ambulatory Visit: Payer: Medicare Other | Admitting: Internal Medicine

## 2022-11-30 DIAGNOSIS — E538 Deficiency of other specified B group vitamins: Secondary | ICD-10-CM | POA: Diagnosis not present

## 2022-11-30 MED ORDER — CYANOCOBALAMIN 1000 MCG/ML IJ SOLN
1000.0000 ug | Freq: Once | INTRAMUSCULAR | Status: AC
  Administered 2022-11-30: 1000 ug via INTRAMUSCULAR

## 2022-11-30 NOTE — Progress Notes (Signed)
Pt here for monthly B12 injection per Dr. Lawerance Bach   B12 given IM and pt tolerated injection well.  Patient advised to report to the office immediately if he notices any adverse reactions. He gave a verbal understanding.

## 2022-12-05 ENCOUNTER — Ambulatory Visit (HOSPITAL_COMMUNITY)
Admission: RE | Admit: 2022-12-05 | Discharge: 2022-12-05 | Disposition: A | Discharge status: Home / Self Care | Payer: Medicare Other | Source: Ambulatory Visit | Attending: Nephrology | Admitting: Nephrology

## 2022-12-05 VITALS — BP 119/66 | HR 75 | Temp 97.3°F | Resp 17

## 2022-12-05 DIAGNOSIS — N184 Chronic kidney disease, stage 4 (severe): Secondary | ICD-10-CM | POA: Insufficient documentation

## 2022-12-05 LAB — IRON AND TIBC
Iron: 67 ug/dL (ref 45–182)
Saturation Ratios: 23 % (ref 17.9–39.5)
TIBC: 293 ug/dL (ref 250–450)
UIBC: 226 ug/dL

## 2022-12-05 LAB — RENAL FUNCTION PANEL
Albumin: 3.9 g/dL (ref 3.5–5.0)
Anion gap: 8 (ref 5–15)
BUN: 59 mg/dL — ABNORMAL HIGH (ref 8–23)
CO2: 24 mmol/L (ref 22–32)
Calcium: 9.8 mg/dL (ref 8.9–10.3)
Chloride: 107 mmol/L (ref 98–111)
Creatinine, Ser: 2.58 mg/dL — ABNORMAL HIGH (ref 0.61–1.24)
GFR, Estimated: 22 mL/min — ABNORMAL LOW (ref >60)
Glucose, Bld: 105 mg/dL — ABNORMAL HIGH (ref 70–99)
Phosphorus: 2.5 mg/dL (ref 2.5–4.6)
Potassium: 4.8 mmol/L (ref 3.5–5.1)
Sodium: 139 mmol/L (ref 135–145)

## 2022-12-05 LAB — POCT HEMOGLOBIN-HEMACUE: Hemoglobin: 11.5 g/dL — ABNORMAL LOW (ref 13.0–17.0)

## 2022-12-05 MED ORDER — EPOETIN ALFA-EPBX 10000 UNIT/ML IJ SOLN
20000.0000 [IU] | INTRAMUSCULAR | Status: DC
Start: 2022-12-05 — End: 2024-01-01
  Administered 2022-12-05: 20000 [IU] via SUBCUTANEOUS

## 2022-12-05 MED ORDER — EPOETIN ALFA-EPBX 10000 UNIT/ML IJ SOLN
INTRAMUSCULAR | Status: AC
  Filled 2022-12-05: qty 2

## 2022-12-21 ENCOUNTER — Ambulatory Visit: Payer: Medicare Other | Admitting: Cardiovascular Disease

## 2022-12-31 ENCOUNTER — Ambulatory Visit (INDEPENDENT_AMBULATORY_CARE_PROVIDER_SITE_OTHER): Payer: Medicare Other

## 2022-12-31 DIAGNOSIS — E538 Deficiency of other specified B group vitamins: Secondary | ICD-10-CM | POA: Diagnosis not present

## 2022-12-31 MED ORDER — CYANOCOBALAMIN 1000 MCG/ML IJ SOLN
1000.0000 ug | Freq: Once | INTRAMUSCULAR | Status: AC
  Administered 2022-12-31: 1000 ug via INTRAMUSCULAR

## 2022-12-31 NOTE — Progress Notes (Signed)
After obtaining consent, and per orders of Dr. Lawerance Bach, injection of B12 given by Ferdie Ping. Patient instructed to report any adverse reaction to me immediately.

## 2023-01-03 ENCOUNTER — Ambulatory Visit (HOSPITAL_COMMUNITY)
Admission: RE | Admit: 2023-01-03 | Discharge: 2023-01-03 | Disposition: A | Discharge status: Home / Self Care | Payer: Medicare Other | Source: Ambulatory Visit | Attending: Nephrology | Admitting: Nephrology

## 2023-01-03 VITALS — BP 90/55 | HR 85 | Temp 97.2°F | Resp 18

## 2023-01-03 DIAGNOSIS — N184 Chronic kidney disease, stage 4 (severe): Secondary | ICD-10-CM | POA: Diagnosis present

## 2023-01-03 LAB — IRON AND TIBC
Iron: 67 ug/dL (ref 45–182)
Saturation Ratios: 22 % (ref 17.9–39.5)
TIBC: 308 ug/dL (ref 250–450)
UIBC: 241 ug/dL

## 2023-01-03 LAB — POCT HEMOGLOBIN-HEMACUE: Hemoglobin: 11.6 g/dL — ABNORMAL LOW (ref 13.0–17.0)

## 2023-01-03 LAB — RENAL FUNCTION PANEL
Albumin: 4 g/dL (ref 3.5–5.0)
Anion gap: 11 (ref 5–15)
BUN: 59 mg/dL — ABNORMAL HIGH (ref 8–23)
CO2: 23 mmol/L (ref 22–32)
Calcium: 10.1 mg/dL (ref 8.9–10.3)
Chloride: 104 mmol/L (ref 98–111)
Creatinine, Ser: 2.34 mg/dL — ABNORMAL HIGH (ref 0.61–1.24)
GFR, Estimated: 25 mL/min — ABNORMAL LOW (ref >60)
Glucose, Bld: 80 mg/dL (ref 70–99)
Phosphorus: 3.1 mg/dL (ref 2.5–4.6)
Potassium: 4.2 mmol/L (ref 3.5–5.1)
Sodium: 138 mmol/L (ref 135–145)

## 2023-01-03 LAB — FERRITIN: Ferritin: 128 ng/mL (ref 24–336)

## 2023-01-03 MED ORDER — EPOETIN ALFA-EPBX 10000 UNIT/ML IJ SOLN: 20000.0000 [IU] | INTRAMUSCULAR | Status: DC

## 2023-01-03 MED ORDER — EPOETIN ALFA-EPBX 10000 UNIT/ML IJ SOLN
INTRAMUSCULAR | Status: AC
  Administered 2023-01-03: 20000 [IU] via SUBCUTANEOUS
  Filled 2023-01-03: qty 2

## 2023-01-04 LAB — PTH, INTACT AND CALCIUM
Calcium, Total (PTH): 9.8 mg/dL (ref 8.6–10.2)
PTH: 16 pg/mL (ref 15–65)

## 2023-01-08 ENCOUNTER — Ambulatory Visit (INDEPENDENT_AMBULATORY_CARE_PROVIDER_SITE_OTHER): Payer: Medicare Other

## 2023-01-08 DIAGNOSIS — I441 Atrioventricular block, second degree: Secondary | ICD-10-CM | POA: Diagnosis not present

## 2023-01-09 LAB — CUP PACEART REMOTE DEVICE CHECK
Battery Impedance: 1824 Ohm
Battery Remaining Longevity: 38 mo
Battery Voltage: 2.76 V
Brady Statistic AP VP Percent: 8 %
Brady Statistic AP VS Percent: 81 %
Brady Statistic AS VP Percent: 0 %
Brady Statistic AS VS Percent: 11 %
Date Time Interrogation Session: 20241231105805
Implantable Lead Connection Status: 753985
Implantable Lead Connection Status: 753985
Implantable Lead Implant Date: 20140321
Implantable Lead Implant Date: 20140321
Implantable Lead Location: 753859
Implantable Lead Location: 753860
Implantable Lead Model: 5076
Implantable Lead Model: 5092
Implantable Pulse Generator Implant Date: 20140321
Lead Channel Impedance Value: 419 Ohm
Lead Channel Impedance Value: 730 Ohm
Lead Channel Pacing Threshold Amplitude: 0.625 V
Lead Channel Pacing Threshold Amplitude: 0.75 V
Lead Channel Pacing Threshold Pulse Width: 0.4 ms
Lead Channel Pacing Threshold Pulse Width: 0.4 ms
Lead Channel Setting Pacing Amplitude: 2 V
Lead Channel Setting Pacing Amplitude: 2.5 V
Lead Channel Setting Pacing Pulse Width: 0.4 ms
Lead Channel Setting Sensing Sensitivity: 5.6 mV
Zone Setting Status: 755011
Zone Setting Status: 755011

## 2023-01-17 ENCOUNTER — Encounter (INDEPENDENT_AMBULATORY_CARE_PROVIDER_SITE_OTHER): Payer: Self-pay

## 2023-01-17 ENCOUNTER — Ambulatory Visit (INDEPENDENT_AMBULATORY_CARE_PROVIDER_SITE_OTHER): Payer: Medicare Other | Admitting: Otolaryngology

## 2023-01-17 VITALS — BP 153/78 | HR 68 | Ht 70.0 in | Wt 141.0 lb

## 2023-01-17 DIAGNOSIS — R040 Epistaxis: Secondary | ICD-10-CM | POA: Diagnosis not present

## 2023-01-18 NOTE — Progress Notes (Signed)
 Patient ID: [name], male   DOB: 1926/12/23, 89 y.o.   MRN: [id number]  Procedure:  Endoscopic control of recurrent right epistaxis  Indication: The patient is a 89 year old male who presents today complaining of recurrent right epistaxis.  The patient was previously seen for recurrent left epistaxis.  He was treated with multiple endoscopic cauterization of his left nasal septum.  According to the patient, he has been doing well since his last procedure.  He has not had any left-sided nasal bleeding.  However, he has noted recurrent right epistaxis lately.  His most recent bleeding was 3 days ago.  He denies any recent nasal trauma.  He is able to breathe through both nostrils.  Description:  The right nasal cavity is sprayed with topical xylocaine  and neo-synephrine.  After adequate anesthesia is achieved, the nasal cavity is examined with a 0 rigid endoscope.  A suction catheter is inserted in parallel with the 0 endoscope, and it is used to suction blood clots from the right nasal cavity.  Hypervascular areas are noted at the anterior and superior aspect of the nasal septum.  A silver nitrate stick is inserted in parallel with the 0 endoscope.  It is used to repeatedly cauterized the hypervascular areas.  Good hemostasis is achieved.  The patient tolerated the procedure well.  Assessment: 1.  Hypervascular areas are noted on the right anterior and superior nasal septum.   2.  Active bleeding is noted today.    Plan: 1. Endoscopic cauterization of the right anterior and superior nasal septum. 2. The nasal endoscopy findings are reviewed with the patient. 3. Nasal ointment/humidifier to treat the nasal dryness. 4. The patient will return for re-evaluation in 1 month.

## 2023-01-23 ENCOUNTER — Encounter: Payer: Self-pay | Admitting: Internal Medicine

## 2023-01-23 ENCOUNTER — Ambulatory Visit: Payer: Medicare Other | Admitting: Internal Medicine

## 2023-01-23 VITALS — BP 130/70 | HR 69 | Temp 98.0°F | Ht 70.0 in | Wt 151.0 lb

## 2023-01-23 DIAGNOSIS — I10 Essential (primary) hypertension: Secondary | ICD-10-CM

## 2023-01-23 DIAGNOSIS — R251 Tremor, unspecified: Secondary | ICD-10-CM | POA: Insufficient documentation

## 2023-01-23 DIAGNOSIS — I5032 Chronic diastolic (congestive) heart failure: Secondary | ICD-10-CM

## 2023-01-23 NOTE — Assessment & Plan Note (Signed)
 He states tremor  that has been occurring for 2-3 years It has been increasing the past year which is why he is here today It involves his hands and arms Tremor last seconds No obvious pattern-at first he thought maybe to happen more when he was going to eat, but not related to activity or time of day Initially concerned about Parkinson's disease ?  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

## 2023-01-23 NOTE — Assessment & Plan Note (Signed)
 Chronic Appears euvolemic.   Continue Lasix  40 mg 3 times a week, will take an extra pill as needed

## 2023-01-23 NOTE — Assessment & Plan Note (Signed)
 Chronic Controlled Continue hydralazine  50 mg 3 times daily, Coreg  3.125 mg twice daily

## 2023-01-23 NOTE — Patient Instructions (Addendum)
     Medications changes include :   None    A referral was ordered neurology and someone will call you to schedule an appointment.

## 2023-01-23 NOTE — Progress Notes (Signed)
 Subjective:    Patient ID: [name], male    DOB: Jan 08, 1927, 89 y.o.   MRN: [id number]      HPI [name] is here for  Chief Complaint  Patient presents with   Medical Management of Chronic Issues    Wants to discuss screening for Parkinson's      Having tremors all the times - it is both hands and arms. It is always both hands/arms.  It probably started 2-3 years ago but they have increased in frequency.  It comes randomly.  They last seconds.  He can go days w/o having them.  Occurs with rest or activity.  May occur more when he is getting ready to eat.    Has been stuttering more - his wife thinks about one month - he thinks longer.   Sometimes when he is laying down both arms and hands will get numb or tingling - he has to move then around to get that to do away.    Doing PT now.  He had them once when doing PT.    Some neck tightness.  No changes in bowel or bladder.  Balance and gait are about the same.      Medications and allergies reviewed with patient and updated if appropriate.  Current Outpatient Medications on File Prior to Visit  Medication Sig Dispense Refill   acetaminophen  (TYLENOL ) 500 MG tablet Take 1,000 mg by mouth 3 (three) times daily as needed for moderate pain.     allopurinol  (ZYLOPRIM ) 100 MG tablet TAKE 1 TABLET DAILY 90 tablet 3   amoxicillin  (AMOXIL ) 500 MG capsule Take 2,000 mg by mouth as directed. 1 HOUR PRIOR TO DENTAL CLEANINGS AND PROCEDURES     Aromatic Inhalants (VICKS VAPOR INHALER IN) Place 1 puff into both nostrils as needed (for congestion).     Ascorbic Acid (VITAMIN C) 1000 MG tablet Take 1,000 mg by mouth daily.     aspirin  EC 81 MG tablet Take 81 mg by mouth daily.     Calcium  Citrate-Vitamin D (CITRACAL + D PO) Take 2 tablets by mouth in the morning and at bedtime.     Carboxymeth-Glycerin-Polysorb (REFRESH OPTIVE MEGA-3 OP) Place 1 drop into both eyes 2 (two) times daily.     carvedilol  (COREG ) 3.125 MG tablet Take 1  tablet (3.125 mg total) by mouth 2 (two) times daily. 180 tablet 3   Cholecalciferol 25 MCG (1000 UT) capsule Take 1,000 Units by mouth daily.     cyanocobalamin  (,VITAMIN B-12,) 1000 MCG/ML injection Inject 1,000 mcg into the muscle once. Monthly injection     folic acid  (FOLVITE ) 1 MG tablet Take 1 tablet (1 mg total) by mouth daily. Annual appt due in May must see provider for future refills (Patient taking differently: Take 1 mg by mouth at bedtime. Annual appt due in May must see provider for future refills) 90 tablet 2   furosemide  (LASIX ) 40 MG tablet Take 1 tablet by mouth every Monday, Wednesday, and Friday.  Take an extra pill three times per week prn 72 tablet 3   hydrALAZINE  (APRESOLINE ) 50 MG tablet Take 1 tablet (50 mg total) by mouth 3 (three) times daily. 270 tablet 3   loratadine  (CLARITIN ) 10 MG tablet Take 10 mg by mouth daily as needed (for seasonal allergies).     Multiple Vitamins-Minerals (PRESERVISION AREDS PO) Take by mouth.     nitroGLYCERIN  (NITROSTAT ) 0.4 MG SL tablet DISSOLVE 1 TABLET UNDER THE TONGUE EVERY 5  MINUTES AS NEEDED FOR CHEST PAIN, MAXIMUM 3 TABLETS 100 tablet 1   Saline (AYR NASAL MIST ALLERGY /SINUS NA) Place 2 sprays into the nose as needed (dryness).     trolamine salicylate (ASPERCREME) 10 % cream Apply 1 Application topically in the morning and at bedtime. For leg cramps     Vitamin D, Ergocalciferol, (DRISDOL) 1.25 MG (50000 UNIT) CAPS capsule Take 50,000 Units by mouth every 30 (thirty) days.     Current Facility-Administered Medications on File Prior to Visit  Medication Dose Route Frequency Provider Last Rate Last Admin   NON FORMULARY 1 application  1 application  Topical PRN Stover, Titorya, DPM        Review of Systems     Objective:   Vitals:   01/23/23 1422  BP: 130/70  Pulse: 69  Temp: 98 F (36.7 C)  SpO2: 97%   BP Readings from Last 3 Encounters:  01/23/23 130/70  01/17/23 (!) 153/78  01/03/23 (!) 90/55   Wt Readings from  Last 3 Encounters:  01/23/23 151 lb (68.5 kg)  01/17/23 141 lb (64 kg)  10/31/22 147 lb 12.8 oz (67 kg)   Body mass index is 21.67 kg/m.    Physical Exam Constitutional:      General: He is not in acute distress.    Appearance: Normal appearance. He is not ill-appearing.  HENT:     Head: Normocephalic and atraumatic.  Skin:    General: Skin is warm and dry.  Neurological:     Mental Status: He is alert and oriented to person, place, and time. Mental status is at baseline.     Sensory: No sensory deficit (in UEs).     Motor: No weakness (in UEs).     Gait: Gait abnormal.     Comments: No tremor on exam            Assessment & Plan:    See Problem List for Assessment and Plan of chronic medical problems.

## 2023-01-28 ENCOUNTER — Telehealth: Payer: Self-pay | Admitting: Cardiovascular Disease

## 2023-01-28 NOTE — Telephone Encounter (Signed)
  Pt c/o medication issue:  1. Name of Medication: xtandi  2. How are you currently taking this medication (dosage and times per day)? Don't know yet   3. Are you having a reaction (difficulty breathing--STAT)?   4. What is your medication issue? Pt said his prostate cancer has progressed, and his urologist would like to prescribe him Xtandi. He doesn't know the dose yet. He was supposed to have an appointment this week, but due to the weather, they rescheduled it for next week. They would like to get Dr. Earmon Phoenix recommendation on whether he can take [facility] and what the potential side effects are.

## 2023-01-29 ENCOUNTER — Telehealth: Payer: Self-pay | Admitting: Internal Medicine

## 2023-01-31 ENCOUNTER — Encounter (HOSPITAL_COMMUNITY): Payer: Medicare Other

## 2023-01-31 ENCOUNTER — Ambulatory Visit: Payer: Medicare Other

## 2023-02-09 NOTE — Telephone Encounter (Signed)
Possible CV side effects:  Cardiovascular: Hypertension (8% to 14.2%), Peripheral edema (11.5% to 15.4%)  Cardiovascular: Ischemic heart disease has been reported; monitoring recommended;

## 2023-02-09 NOTE — Telephone Encounter (Signed)
Copied from CRM [id number]. Topic: General - Deceased Patient >> 02-18-23  2:13 PM [name] wrote: Name of caller: [name]  Date of death: 2023-02-18   Call received from patient's family member to inform office of passing and make sure appointments are cancelled. If any forms are needed to be signed in the future they will let us know

## 2023-02-09 NOTE — Telephone Encounter (Signed)
I do not have any experience with this medication.  Hartsell thank you for taking a look at the potential cardiovascular side effects.  If this is needed for management of prostate cancer, I think it is appropriate as the risks of serious cardiovascular side effects appear to be fairly low.

## 2023-02-09 DEATH — deceased

## 2023-02-13 ENCOUNTER — Other Ambulatory Visit (HOSPITAL_COMMUNITY): Payer: Medicare Other

## 2023-02-13 ENCOUNTER — Ambulatory Visit: Payer: Medicare Other

## 2023-02-13 ENCOUNTER — Ambulatory Visit: Payer: Medicare Other | Admitting: Internal Medicine

## 2023-02-15 ENCOUNTER — Ambulatory Visit: Payer: Medicare Other | Admitting: Internal Medicine

## 2023-02-19 ENCOUNTER — Ambulatory Visit (INDEPENDENT_AMBULATORY_CARE_PROVIDER_SITE_OTHER): Payer: Medicare Other

## 2023-02-19 NOTE — Progress Notes (Signed)
Remote pacemaker transmission.

## 2023-02-19 NOTE — Addendum Note (Signed)
Addended by: Geralyn Flash D on: 02/19/2023 02:49 PM   Modules accepted: Orders

## 2023-02-28 ENCOUNTER — Encounter (HOSPITAL_COMMUNITY): Payer: Medicare Other

## 2023-03-14 ENCOUNTER — Institutional Professional Consult (permissible substitution): Payer: Medicare Other | Admitting: Neurology

## 2023-05-02 ENCOUNTER — Ambulatory Visit: Payer: Medicare Other | Admitting: Cardiovascular Disease

## 2023-07-19 IMAGING — CT CT L SPINE W/O CM
4 of 6 series · 12 of 33 positions shown, 14 images · non-contrast
Comparison: Lumbar spine CT 01/19/2020 and earlier.

CLINICAL DATA: [AGE] male with low back pain. Bilateral leg
tingling and numbness. Chronic trouble walking. History of prostate
cancer.

EXAM:
CT LUMBAR SPINE WITHOUT CONTRAST
TECHNIQUE: Multidetector CT imaging of the lumbar spine was performed without
intravenous contrast administration. Multiplanar CT image
reconstructions were also generated.

[Series 3: l-spine 2.00 br40 s3 (person_name) · axial · 0.36mm/px · 1 of 119 slices shown]
[im 40/119  bone]
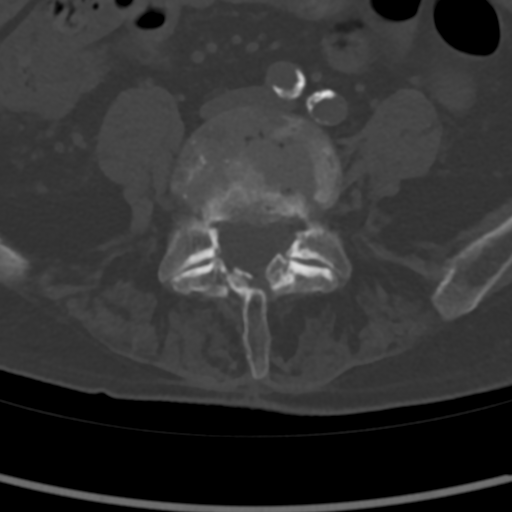

[Series 7: l-spine 2.00 br44 s3 sag soft · sagittal · 0.28mm/px · 5 of 93 slices shown, 6 images]
[im 31/93  bone]
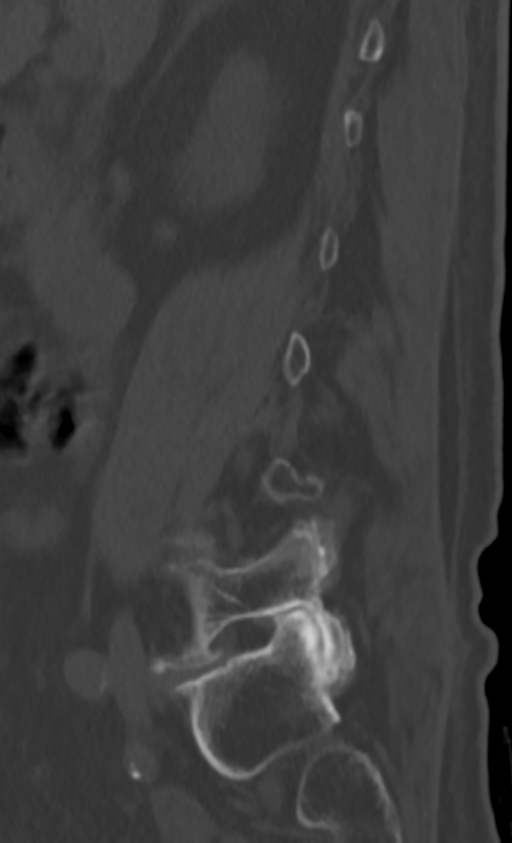
[im 39/93  bone]
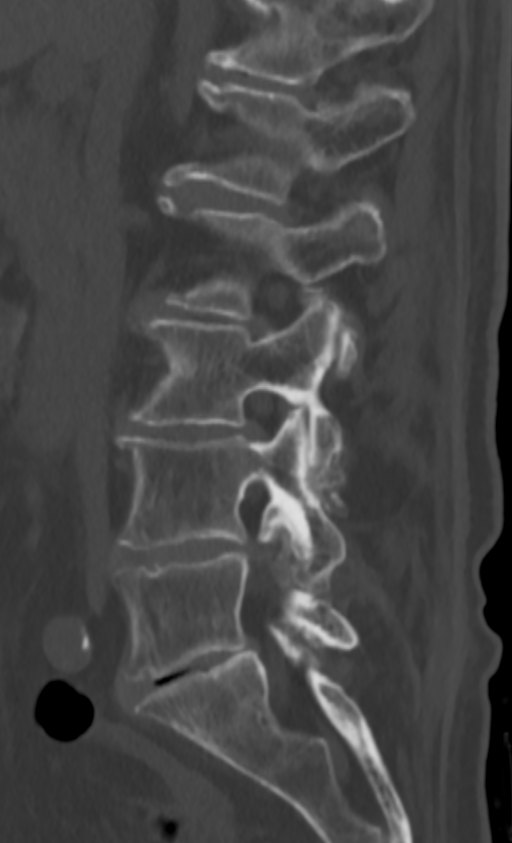
[im 47/93  soft-tissue]
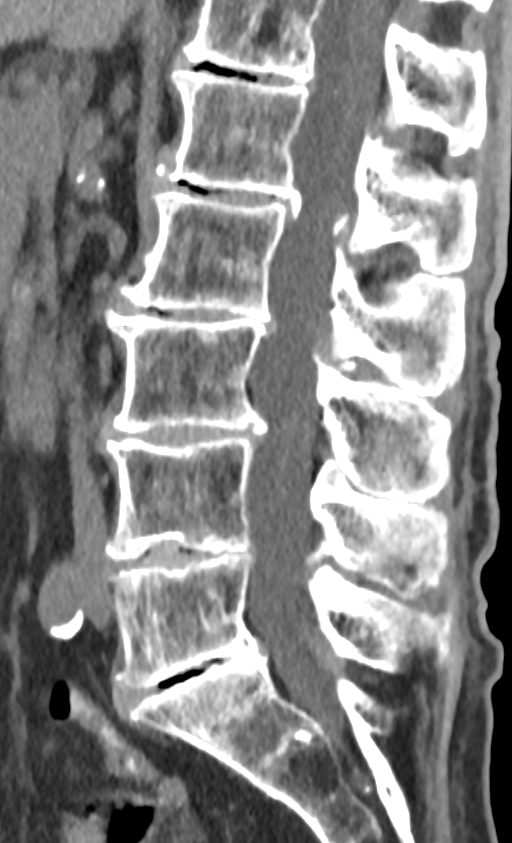
[im 47/93  bone]
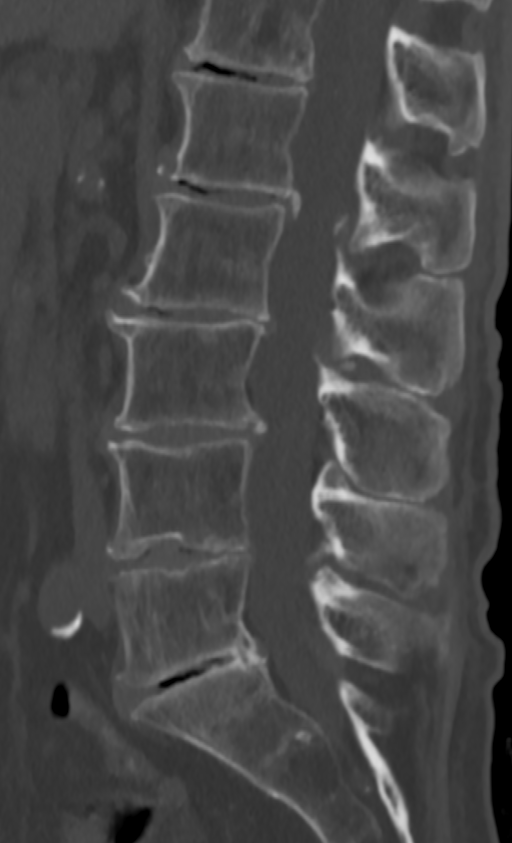
[im 54/93  bone]
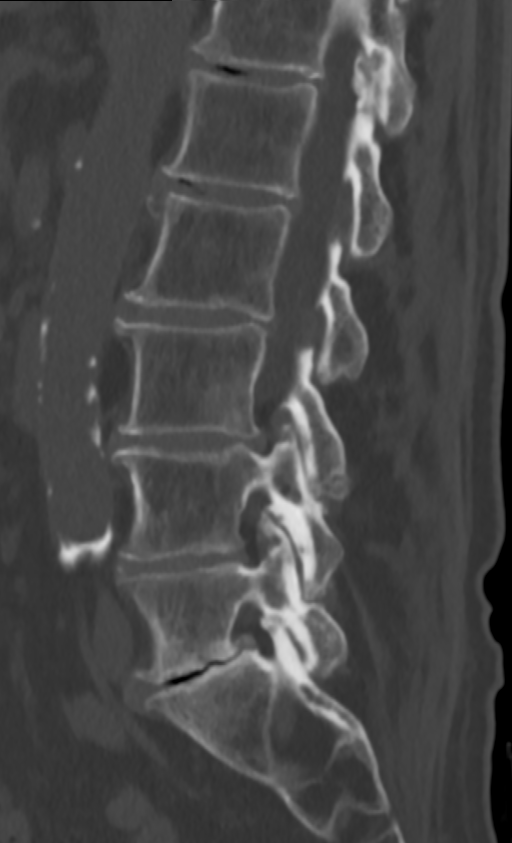
[im 62/93  bone]
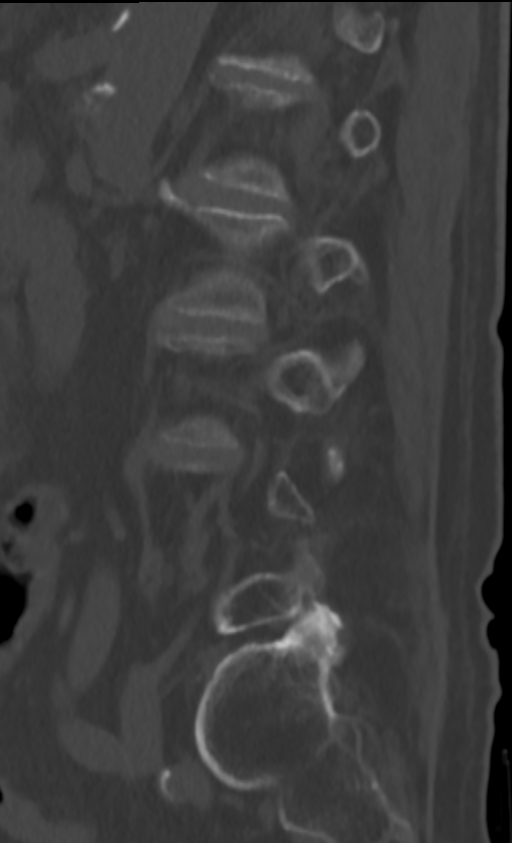

[Series 9: l-spine 2.00 br60 s3 cor bone · coronal · 0.37mm/px · 3 of 72 slices shown]
[im 15/72  bone]
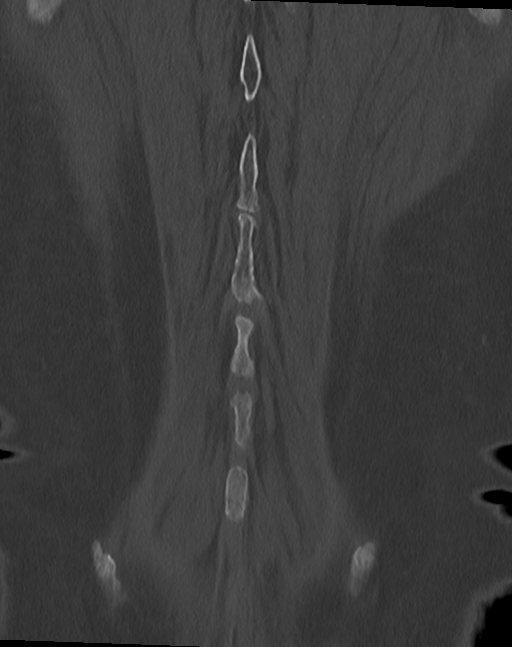
[im 29/72  bone]
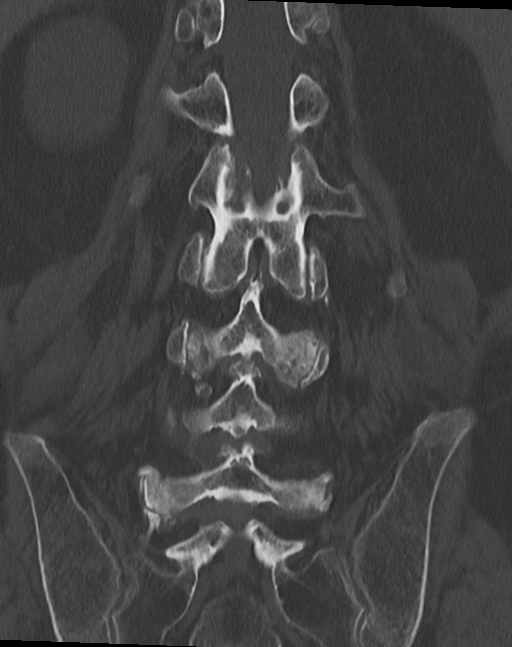
[im 43/72  bone]
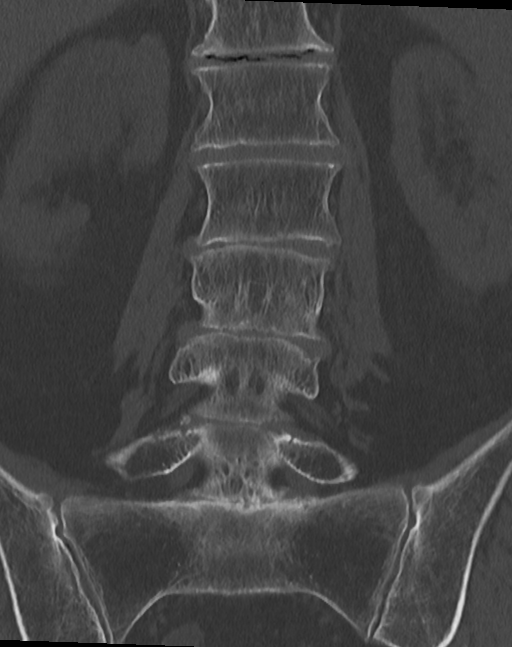

[Series 12: l-spine 2.00 br60 s3 true axials bone · axial · 0.31mm/px · z∈[+1348,+1613]mm · 3 of 116 slices shown, 4 images]
[im 1/116  soft-tissue]
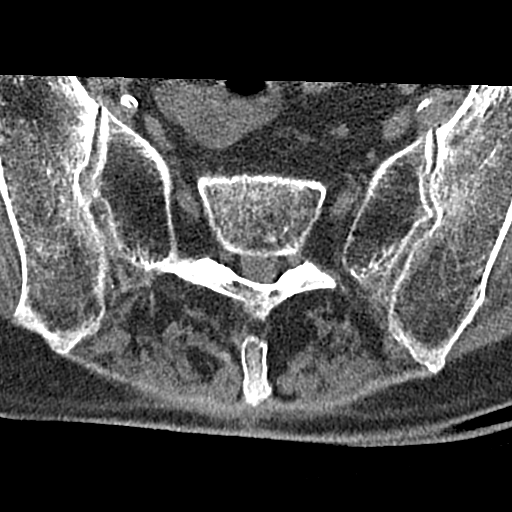
[im 1/116  bone]
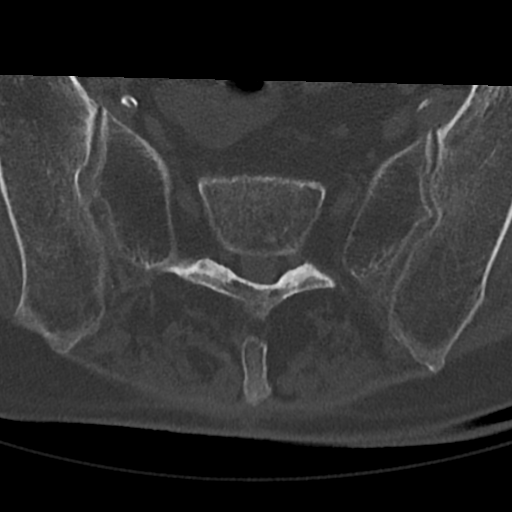
[im 58/116  bone]
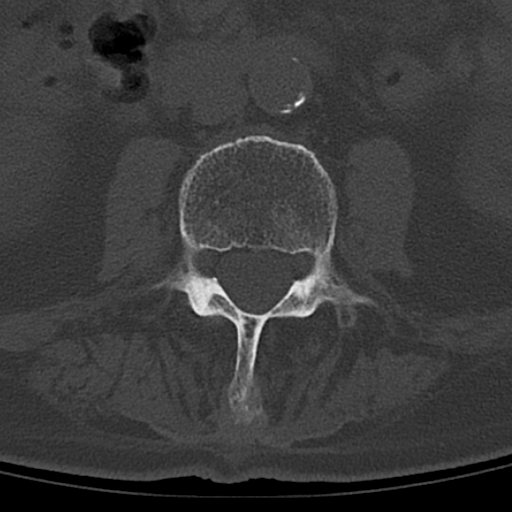
[im 116/116  bone]
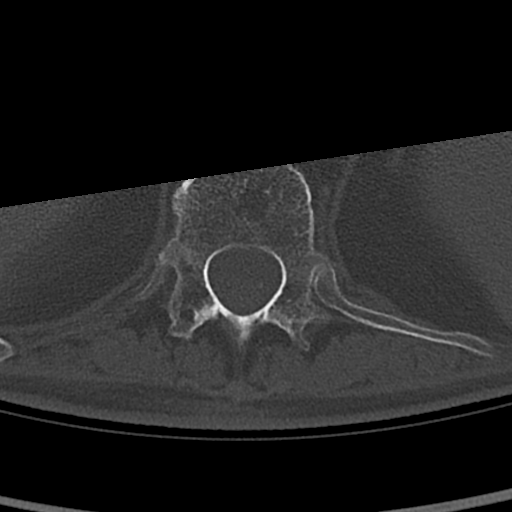

[12 of 33 positions shown; findings below may reference images not displayed]

FINDINGS: Segmentation: Normal, the same numbering system used for.

Alignment: Stable mild levoconvex lumbar scoliosis and straightening
of lordosis. No spondylolisthesis.

Vertebrae: Osteopenia. Stable bone mineralization. No acute osseous
abnormality identified. Intact visible sacrum and SI joints.

Paraspinal and other soft tissues: Stable visible noncontrast
abdominal viscera. Aortoiliac calcified atherosclerosis. Normal
caliber abdominal aorta. Lumbar paraspinal soft tissues remain
within normal limits.

Disc levels:

Chronic lumbar spine degeneration but mild for age at most levels.
Fairly capacious underlying spinal canal. Isolated mild degenerative
spinal stenosis suspected at L3-L4 and stable. Chronic postoperative
changes to the lamina of L4-L5. Other degenerative findings have not
progressed since [REDACTED].
IMPRESSION: 1. No acute osseous abnormality in the lumbar spine.
2. Capacious spinal canal with overall mild for age lumbar spine
degeneration, not progressed since [REDACTED]. Multifactorial mild
spinal stenosis at L3-L4.
3. Aortic Atherosclerosis ([id number]).
